# Patient Record
Sex: Female | Born: 1955 | Race: White | Hispanic: No | Marital: Married | Smoking: Never smoker
Health system: Southern US, Community
[De-identification: ages and names within clinical notes are randomized; demographics above are authoritative.]

## PROBLEM LIST (undated history)

## (undated) DIAGNOSIS — Z860100 Personal history of colon polyps, unspecified: Secondary | ICD-10-CM

## (undated) DIAGNOSIS — Z8669 Personal history of other diseases of the nervous system and sense organs: Secondary | ICD-10-CM

## (undated) DIAGNOSIS — H9193 Unspecified hearing loss, bilateral: Secondary | ICD-10-CM

## (undated) DIAGNOSIS — H8101 Meniere's disease, right ear: Secondary | ICD-10-CM

## (undated) DIAGNOSIS — I10 Essential (primary) hypertension: Secondary | ICD-10-CM

## (undated) DIAGNOSIS — Z9181 History of falling: Secondary | ICD-10-CM

## (undated) DIAGNOSIS — R112 Nausea with vomiting, unspecified: Secondary | ICD-10-CM

## (undated) DIAGNOSIS — M5030 Other cervical disc degeneration, unspecified cervical region: Secondary | ICD-10-CM

## (undated) DIAGNOSIS — Z96649 Presence of unspecified artificial hip joint: Secondary | ICD-10-CM

## (undated) DIAGNOSIS — M2040 Other hammer toe(s) (acquired), unspecified foot: Secondary | ICD-10-CM

## (undated) DIAGNOSIS — M7062 Trochanteric bursitis, left hip: Secondary | ICD-10-CM

## (undated) DIAGNOSIS — M1612 Unilateral primary osteoarthritis, left hip: Secondary | ICD-10-CM

## (undated) DIAGNOSIS — M542 Cervicalgia: Secondary | ICD-10-CM

## (undated) DIAGNOSIS — G43709 Chronic migraine without aura, not intractable, without status migrainosus: Secondary | ICD-10-CM

## (undated) DIAGNOSIS — M545 Low back pain: Secondary | ICD-10-CM

## (undated) DIAGNOSIS — E039 Hypothyroidism, unspecified: Secondary | ICD-10-CM

## (undated) DIAGNOSIS — Z0389 Encounter for observation for other suspected diseases and conditions ruled out: Secondary | ICD-10-CM

## (undated) DIAGNOSIS — F0781 Postconcussional syndrome: Secondary | ICD-10-CM

## (undated) DIAGNOSIS — Z8489 Family history of other specified conditions: Secondary | ICD-10-CM

## (undated) DIAGNOSIS — H8110 Benign paroxysmal vertigo, unspecified ear: Secondary | ICD-10-CM

## (undated) DIAGNOSIS — B029 Zoster without complications: Secondary | ICD-10-CM

## (undated) DIAGNOSIS — M797 Fibromyalgia: Secondary | ICD-10-CM

## (undated) DIAGNOSIS — M51369 Other intervertebral disc degeneration, lumbar region without mention of lumbar back pain or lower extremity pain: Secondary | ICD-10-CM

## (undated) DIAGNOSIS — G4761 Periodic limb movement disorder: Secondary | ICD-10-CM

## (undated) DIAGNOSIS — Z8619 Personal history of other infectious and parasitic diseases: Secondary | ICD-10-CM

## (undated) DIAGNOSIS — F458 Other somatoform disorders: Secondary | ICD-10-CM

## (undated) DIAGNOSIS — M1990 Unspecified osteoarthritis, unspecified site: Secondary | ICD-10-CM

## (undated) DIAGNOSIS — N951 Menopausal and female climacteric states: Secondary | ICD-10-CM

## (undated) DIAGNOSIS — Z889 Allergy status to unspecified drugs, medicaments and biological substances status: Secondary | ICD-10-CM

## (undated) DIAGNOSIS — IMO0001 Reserved for inherently not codable concepts without codable children: Secondary | ICD-10-CM

## (undated) DIAGNOSIS — M47816 Spondylosis without myelopathy or radiculopathy, lumbar region: Secondary | ICD-10-CM

## (undated) DIAGNOSIS — E785 Hyperlipidemia, unspecified: Secondary | ICD-10-CM

## (undated) DIAGNOSIS — T84038A Mechanical loosening of other internal prosthetic joint, initial encounter: Secondary | ICD-10-CM

## (undated) DIAGNOSIS — J309 Allergic rhinitis, unspecified: Secondary | ICD-10-CM

## (undated) DIAGNOSIS — N3010 Interstitial cystitis (chronic) without hematuria: Secondary | ICD-10-CM

## (undated) DIAGNOSIS — E063 Autoimmune thyroiditis: Secondary | ICD-10-CM

## (undated) DIAGNOSIS — H1010 Acute atopic conjunctivitis, unspecified eye: Secondary | ICD-10-CM

## (undated) DIAGNOSIS — M5126 Other intervertebral disc displacement, lumbar region: Secondary | ICD-10-CM

## (undated) DIAGNOSIS — K219 Gastro-esophageal reflux disease without esophagitis: Secondary | ICD-10-CM

## (undated) DIAGNOSIS — K6289 Other specified diseases of anus and rectum: Secondary | ICD-10-CM

## (undated) DIAGNOSIS — M5459 Other low back pain: Secondary | ICD-10-CM

## (undated) DIAGNOSIS — Z8719 Personal history of other diseases of the digestive system: Secondary | ICD-10-CM

## (undated) DIAGNOSIS — F39 Unspecified mood [affective] disorder: Secondary | ICD-10-CM

## (undated) DIAGNOSIS — G43711 Chronic migraine without aura, intractable, with status migrainosus: Secondary | ICD-10-CM

## (undated) DIAGNOSIS — E663 Overweight: Secondary | ICD-10-CM

## (undated) DIAGNOSIS — F32A Depression, unspecified: Secondary | ICD-10-CM

## (undated) DIAGNOSIS — R2689 Other abnormalities of gait and mobility: Secondary | ICD-10-CM

## (undated) DIAGNOSIS — E041 Nontoxic single thyroid nodule: Secondary | ICD-10-CM

## (undated) DIAGNOSIS — I499 Cardiac arrhythmia, unspecified: Secondary | ICD-10-CM

## (undated) DIAGNOSIS — S6982XA Other specified injuries of left wrist, hand and finger(s), initial encounter: Secondary | ICD-10-CM

## (undated) DIAGNOSIS — Z9889 Other specified postprocedural states: Secondary | ICD-10-CM

## (undated) DIAGNOSIS — M5136 Other intervertebral disc degeneration, lumbar region: Secondary | ICD-10-CM

## (undated) DIAGNOSIS — Z8249 Family history of ischemic heart disease and other diseases of the circulatory system: Secondary | ICD-10-CM

## (undated) DIAGNOSIS — M25512 Pain in left shoulder: Secondary | ICD-10-CM

## (undated) DIAGNOSIS — F329 Major depressive disorder, single episode, unspecified: Secondary | ICD-10-CM

## (undated) DIAGNOSIS — H43811 Vitreous degeneration, right eye: Secondary | ICD-10-CM

## (undated) DIAGNOSIS — R002 Palpitations: Secondary | ICD-10-CM

## (undated) HISTORY — DX: Personal history of other infectious and parasitic diseases: Z86.19

## (undated) HISTORY — DX: Family history of ischemic heart disease and other diseases of the circulatory system: Z82.49

## (undated) HISTORY — DX: Chronic migraine without aura, intractable, with status migrainosus: G43.711

## (undated) HISTORY — DX: Zoster without complications: B02.9

## (undated) HISTORY — DX: Fibromyalgia: M79.7

## (undated) HISTORY — DX: Presence of unspecified artificial hip joint: Z96.649

## (undated) HISTORY — DX: Other abnormalities of gait and mobility: R26.89

## (undated) HISTORY — DX: Postconcussional syndrome: F07.81

## (undated) HISTORY — DX: Other intervertebral disc displacement, lumbar region: M51.26

## (undated) HISTORY — DX: Allergic rhinitis, unspecified: J30.9

## (undated) HISTORY — DX: Hyperlipidemia, unspecified: E78.5

## (undated) HISTORY — DX: Pain in left shoulder: M25.512

## (undated) HISTORY — DX: Mechanical loosening of other internal prosthetic joint, initial encounter: T84.038A

## (undated) HISTORY — DX: Personal history of colon polyps, unspecified: Z86.0100

## (undated) HISTORY — DX: Unilateral primary osteoarthritis, left hip: M16.12

## (undated) HISTORY — DX: Encounter for observation for other suspected diseases and conditions ruled out: Z03.89

## (undated) HISTORY — DX: Other somatoform disorders: F45.8

## (undated) HISTORY — DX: Other low back pain: M54.59

## (undated) HISTORY — PX: HIP ARTHROPLASTY: SHX981

## (undated) HISTORY — DX: Benign paroxysmal vertigo, unspecified ear: H81.10

## (undated) HISTORY — DX: Cervicalgia: M54.2

## (undated) HISTORY — DX: Chronic migraine without aura, not intractable, without status migrainosus: G43.709

## (undated) HISTORY — DX: Other specified diseases of anus and rectum: K62.89

## (undated) HISTORY — DX: Allergy status to unspecified drugs, medicaments and biological substances: Z88.9

## (undated) HISTORY — PX: OTHER SURGICAL HISTORY: SHX169

## (undated) HISTORY — DX: Other hammer toe(s) (acquired), unspecified foot: M20.40

## (undated) HISTORY — DX: Low back pain: M54.5

## (undated) HISTORY — PX: CYSTOSCOPY W/ DILATION OF BLADDER: SUR374

## (undated) HISTORY — DX: Spondylosis without myelopathy or radiculopathy, lumbar region: M47.816

## (undated) HISTORY — DX: Personal history of other diseases of the nervous system and sense organs: Z86.69

## (undated) HISTORY — DX: Personal history of other diseases of the digestive system: Z87.19

## (undated) HISTORY — DX: Gastro-esophageal reflux disease without esophagitis: K21.9

## (undated) HISTORY — DX: Nontoxic single thyroid nodule: E04.1

## (undated) HISTORY — DX: Meniere's disease, right ear: H81.01

## (undated) HISTORY — DX: Palpitations: R00.2

## (undated) HISTORY — DX: Overweight: E66.3

## (undated) HISTORY — DX: Unspecified mood (affective) disorder: F39

## (undated) HISTORY — DX: Essential (primary) hypertension: I10

## (undated) HISTORY — DX: Trochanteric bursitis, left hip: M70.62

## (undated) HISTORY — DX: Personal history of colonic polyps: Z86.010

## (undated) HISTORY — DX: Autoimmune thyroiditis: E06.3

## (undated) HISTORY — DX: Hypothyroidism, unspecified: E03.9

## (undated) HISTORY — DX: Periodic limb movement disorder: G47.61

## (undated) HISTORY — DX: Vitreous degeneration, right eye: H43.811

## (undated) HISTORY — DX: Other cervical disc degeneration, unspecified cervical region: M50.30

## (undated) HISTORY — PX: COLONOSCOPY: SHX174

## (undated) HISTORY — DX: Menopausal and female climacteric states: N95.1

## (undated) HISTORY — DX: Unspecified hearing loss, bilateral: H91.93

## (undated) HISTORY — DX: History of falling: Z91.81

## (undated) HISTORY — DX: Acute atopic conjunctivitis, unspecified eye: H10.10

---
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
## 1996-09-11 DIAGNOSIS — E785 Hyperlipidemia, unspecified: Secondary | ICD-10-CM

## 1996-09-11 HISTORY — DX: Hyperlipidemia, unspecified: E78.5

## 1998-03-11 ENCOUNTER — Ambulatory Visit (HOSPITAL_COMMUNITY): Admission: RE | Admit: 1998-03-11 | Discharge: 1998-03-11 | Payer: Self-pay | Admitting: Endocrinology

## 1998-09-11 HISTORY — PX: CARDIOVASCULAR STRESS TEST: SHX262

## 1998-10-27 ENCOUNTER — Ambulatory Visit (HOSPITAL_COMMUNITY): Admission: RE | Admit: 1998-10-27 | Discharge: 1998-10-27 | Payer: Self-pay | Admitting: Obstetrics and Gynecology

## 1998-10-27 ENCOUNTER — Encounter: Payer: Self-pay | Admitting: Obstetrics and Gynecology

## 1999-01-19 ENCOUNTER — Encounter: Admission: RE | Admit: 1999-01-19 | Discharge: 1999-04-19 | Payer: Self-pay | Admitting: Internal Medicine

## 1999-02-16 ENCOUNTER — Encounter: Admission: RE | Admit: 1999-02-16 | Discharge: 1999-05-17 | Payer: Self-pay | Admitting: Urology

## 1999-02-28 ENCOUNTER — Ambulatory Visit (HOSPITAL_COMMUNITY): Admission: RE | Admit: 1999-02-28 | Discharge: 1999-02-28 | Payer: Self-pay | Admitting: Interventional Cardiology

## 2001-04-24 ENCOUNTER — Encounter: Admission: RE | Admit: 2001-04-24 | Discharge: 2001-04-24 | Payer: Self-pay | Admitting: Obstetrics and Gynecology

## 2001-04-24 ENCOUNTER — Encounter: Payer: Self-pay | Admitting: Obstetrics and Gynecology

## 2001-08-26 ENCOUNTER — Encounter: Payer: Self-pay | Admitting: Obstetrics and Gynecology

## 2001-08-26 ENCOUNTER — Encounter: Admission: RE | Admit: 2001-08-26 | Discharge: 2001-08-26 | Payer: Self-pay | Admitting: Obstetrics and Gynecology

## 2002-09-08 ENCOUNTER — Encounter: Payer: Self-pay | Admitting: Obstetrics and Gynecology

## 2002-09-08 ENCOUNTER — Encounter: Admission: RE | Admit: 2002-09-08 | Discharge: 2002-09-08 | Payer: Self-pay | Admitting: Obstetrics and Gynecology

## 2003-06-23 ENCOUNTER — Encounter: Admission: RE | Admit: 2003-06-23 | Discharge: 2003-06-23 | Payer: Self-pay | Admitting: Internal Medicine

## 2003-06-23 ENCOUNTER — Encounter: Payer: Self-pay | Admitting: Internal Medicine

## 2003-08-04 ENCOUNTER — Ambulatory Visit (HOSPITAL_COMMUNITY): Admission: RE | Admit: 2003-08-04 | Discharge: 2003-08-04 | Payer: Self-pay | Admitting: Internal Medicine

## 2003-09-15 ENCOUNTER — Encounter: Admission: RE | Admit: 2003-09-15 | Discharge: 2003-09-15 | Payer: Self-pay | Admitting: Obstetrics and Gynecology

## 2003-09-24 ENCOUNTER — Other Ambulatory Visit: Admission: RE | Admit: 2003-09-24 | Discharge: 2003-09-24 | Payer: Self-pay | Admitting: Obstetrics and Gynecology

## 2004-09-26 ENCOUNTER — Other Ambulatory Visit: Admission: RE | Admit: 2004-09-26 | Discharge: 2004-09-26 | Payer: Self-pay | Admitting: Obstetrics and Gynecology

## 2004-10-13 ENCOUNTER — Encounter: Admission: RE | Admit: 2004-10-13 | Discharge: 2004-10-13 | Payer: Self-pay | Admitting: Obstetrics and Gynecology

## 2005-09-02 ENCOUNTER — Encounter: Admission: RE | Admit: 2005-09-02 | Discharge: 2005-09-02 | Payer: Self-pay | Admitting: Rheumatology

## 2005-11-20 ENCOUNTER — Encounter: Admission: RE | Admit: 2005-11-20 | Discharge: 2005-11-20 | Payer: Self-pay | Admitting: Obstetrics and Gynecology

## 2006-01-10 ENCOUNTER — Encounter: Payer: Self-pay | Admitting: Obstetrics and Gynecology

## 2006-08-09 ENCOUNTER — Other Ambulatory Visit: Admission: RE | Admit: 2006-08-09 | Discharge: 2006-08-09 | Payer: Self-pay | Admitting: Obstetrics and Gynecology

## 2007-03-19 ENCOUNTER — Encounter: Admission: RE | Admit: 2007-03-19 | Discharge: 2007-03-19 | Payer: Self-pay | Admitting: Obstetrics and Gynecology

## 2008-03-20 ENCOUNTER — Encounter: Admission: RE | Admit: 2008-03-20 | Discharge: 2008-03-20 | Payer: Self-pay | Admitting: Obstetrics and Gynecology

## 2009-02-16 ENCOUNTER — Encounter: Admission: RE | Admit: 2009-02-16 | Discharge: 2009-02-16 | Payer: Self-pay | Admitting: Physician Assistant

## 2009-03-22 ENCOUNTER — Encounter: Admission: RE | Admit: 2009-03-22 | Discharge: 2009-03-22 | Payer: Self-pay | Admitting: Obstetrics and Gynecology

## 2009-08-11 DIAGNOSIS — E041 Nontoxic single thyroid nodule: Secondary | ICD-10-CM

## 2009-08-11 HISTORY — DX: Nontoxic single thyroid nodule: E04.1

## 2009-08-30 ENCOUNTER — Encounter: Admission: RE | Admit: 2009-08-30 | Discharge: 2009-08-30 | Payer: Self-pay | Admitting: Family Medicine

## 2009-09-11 HISTORY — PX: CARPOMETACARPAL JOINT ARTHROTOMY: SUR102

## 2009-09-11 HISTORY — PX: BREAST BIOPSY: SHX20

## 2010-04-06 ENCOUNTER — Encounter: Admission: RE | Admit: 2010-04-06 | Discharge: 2010-04-06 | Payer: Self-pay | Admitting: Obstetrics and Gynecology

## 2010-04-08 ENCOUNTER — Encounter: Admission: RE | Admit: 2010-04-08 | Discharge: 2010-04-08 | Payer: Self-pay | Admitting: Obstetrics and Gynecology

## 2010-09-16 ENCOUNTER — Encounter
Admission: RE | Admit: 2010-09-16 | Discharge: 2010-09-16 | Payer: Self-pay | Source: Home / Self Care | Attending: Obstetrics and Gynecology | Admitting: Obstetrics and Gynecology

## 2011-02-17 ENCOUNTER — Other Ambulatory Visit: Payer: Self-pay | Admitting: Obstetrics and Gynecology

## 2011-02-17 DIAGNOSIS — Z1231 Encounter for screening mammogram for malignant neoplasm of breast: Secondary | ICD-10-CM

## 2011-03-17 ENCOUNTER — Other Ambulatory Visit: Payer: Self-pay | Admitting: Obstetrics and Gynecology

## 2011-03-17 ENCOUNTER — Ambulatory Visit
Admission: RE | Admit: 2011-03-17 | Discharge: 2011-03-17 | Disposition: A | Discharge status: Home / Self Care | Payer: No Typology Code available for payment source | Source: Ambulatory Visit | Attending: Obstetrics and Gynecology | Admitting: Obstetrics and Gynecology

## 2011-03-17 DIAGNOSIS — Z1231 Encounter for screening mammogram for malignant neoplasm of breast: Secondary | ICD-10-CM

## 2011-04-10 ENCOUNTER — Ambulatory Visit
Admission: RE | Admit: 2011-04-10 | Discharge: 2011-04-10 | Disposition: A | Discharge status: Home / Self Care | Payer: No Typology Code available for payment source | Source: Ambulatory Visit | Attending: Obstetrics and Gynecology | Admitting: Obstetrics and Gynecology

## 2011-04-10 DIAGNOSIS — Z1231 Encounter for screening mammogram for malignant neoplasm of breast: Secondary | ICD-10-CM

## 2011-10-30 ENCOUNTER — Other Ambulatory Visit: Payer: Self-pay | Admitting: Family Medicine

## 2011-10-30 ENCOUNTER — Ambulatory Visit: Payer: No Typology Code available for payment source | Admitting: Family Medicine

## 2011-10-30 ENCOUNTER — Encounter: Payer: Self-pay | Admitting: Family Medicine

## 2011-10-30 DIAGNOSIS — M47816 Spondylosis without myelopathy or radiculopathy, lumbar region: Secondary | ICD-10-CM

## 2011-10-30 HISTORY — DX: Spondylosis without myelopathy or radiculopathy, lumbar region: M47.816

## 2011-11-09 DIAGNOSIS — N3010 Interstitial cystitis (chronic) without hematuria: Secondary | ICD-10-CM | POA: Insufficient documentation

## 2011-11-09 DIAGNOSIS — E039 Hypothyroidism, unspecified: Secondary | ICD-10-CM | POA: Insufficient documentation

## 2011-11-09 HISTORY — DX: Interstitial cystitis (chronic) without hematuria: N30.10

## 2012-04-22 ENCOUNTER — Other Ambulatory Visit: Payer: Self-pay | Admitting: Obstetrics and Gynecology

## 2012-04-22 DIAGNOSIS — Z1231 Encounter for screening mammogram for malignant neoplasm of breast: Secondary | ICD-10-CM

## 2012-05-03 ENCOUNTER — Encounter: Payer: Self-pay | Admitting: Gastroenterology

## 2012-05-08 ENCOUNTER — Ambulatory Visit
Admission: RE | Admit: 2012-05-08 | Discharge: 2012-05-08 | Disposition: A | Discharge status: Home / Self Care | Payer: No Typology Code available for payment source | Source: Ambulatory Visit | Attending: Obstetrics and Gynecology | Admitting: Obstetrics and Gynecology

## 2012-05-08 DIAGNOSIS — Z1231 Encounter for screening mammogram for malignant neoplasm of breast: Secondary | ICD-10-CM

## 2012-06-03 ENCOUNTER — Ambulatory Visit: Payer: No Typology Code available for payment source | Admitting: Gastroenterology

## 2012-06-26 ENCOUNTER — Other Ambulatory Visit: Payer: Self-pay | Admitting: Gastroenterology

## 2012-09-11 DIAGNOSIS — H43811 Vitreous degeneration, right eye: Secondary | ICD-10-CM

## 2012-09-11 HISTORY — DX: Vitreous degeneration, right eye: H43.811

## 2012-10-28 DIAGNOSIS — I10 Essential (primary) hypertension: Secondary | ICD-10-CM | POA: Insufficient documentation

## 2012-10-28 HISTORY — DX: Essential (primary) hypertension: I10

## 2013-03-31 ENCOUNTER — Other Ambulatory Visit: Payer: Self-pay

## 2013-03-31 DIAGNOSIS — Z1231 Encounter for screening mammogram for malignant neoplasm of breast: Secondary | ICD-10-CM

## 2013-05-09 ENCOUNTER — Ambulatory Visit
Admission: RE | Admit: 2013-05-09 | Discharge: 2013-05-09 | Disposition: A | Discharge status: Home / Self Care | Payer: No Typology Code available for payment source | Source: Ambulatory Visit

## 2013-05-09 DIAGNOSIS — Z1231 Encounter for screening mammogram for malignant neoplasm of breast: Secondary | ICD-10-CM

## 2013-05-15 ENCOUNTER — Encounter: Payer: Self-pay | Admitting: Cardiovascular Disease

## 2013-05-15 ENCOUNTER — Ambulatory Visit (INDEPENDENT_AMBULATORY_CARE_PROVIDER_SITE_OTHER): Payer: No Typology Code available for payment source | Admitting: Cardiovascular Disease

## 2013-05-15 VITALS — BP 106/70 | HR 99 | Ht 66.5 in | Wt 204.1 lb

## 2013-05-15 DIAGNOSIS — E7800 Pure hypercholesterolemia, unspecified: Secondary | ICD-10-CM | POA: Insufficient documentation

## 2013-05-15 DIAGNOSIS — E039 Hypothyroidism, unspecified: Secondary | ICD-10-CM

## 2013-05-15 DIAGNOSIS — R002 Palpitations: Secondary | ICD-10-CM

## 2013-05-15 DIAGNOSIS — G43709 Chronic migraine without aura, not intractable, without status migrainosus: Secondary | ICD-10-CM | POA: Insufficient documentation

## 2013-05-15 DIAGNOSIS — N3010 Interstitial cystitis (chronic) without hematuria: Secondary | ICD-10-CM

## 2013-05-15 DIAGNOSIS — I119 Hypertensive heart disease without heart failure: Secondary | ICD-10-CM

## 2013-05-15 DIAGNOSIS — E782 Mixed hyperlipidemia: Secondary | ICD-10-CM

## 2013-05-15 DIAGNOSIS — I10 Essential (primary) hypertension: Secondary | ICD-10-CM

## 2013-05-15 DIAGNOSIS — Z8249 Family history of ischemic heart disease and other diseases of the circulatory system: Secondary | ICD-10-CM

## 2013-05-15 DIAGNOSIS — Z8669 Personal history of other diseases of the nervous system and sense organs: Secondary | ICD-10-CM

## 2013-05-15 DIAGNOSIS — E785 Hyperlipidemia, unspecified: Secondary | ICD-10-CM

## 2013-05-15 HISTORY — DX: Palpitations: R00.2

## 2013-05-15 HISTORY — DX: Family history of ischemic heart disease and other diseases of the circulatory system: Z82.49

## 2013-05-15 HISTORY — DX: Chronic migraine without aura, not intractable, without status migrainosus: G43.709

## 2013-05-15 HISTORY — DX: Essential (primary) hypertension: I10

## 2013-05-15 HISTORY — DX: Pure hypercholesterolemia, unspecified: E78.00

## 2013-05-15 NOTE — Progress Notes (Signed)
Patient ID: FERRELL FLAM, female   DOB: Jan 24, 1956, 57 y.o.   MRN: [id number]     PATIENT PROFILE:  [name] is a 57 year old female who is referred through the courtesy of Dr. Herb Grays for cardiology evaluation in this patient who has cardiac risk factors and very strong family history for premature coronary artery disease.   HPI: Molly Giles admits to at least a 10 year history of hypertension, a six-year history of hyperlipidemia as well as a 4 year history of hypothyroidism. Molly Giles also has a history of interstitial cystitis. Molly Giles tells me in approximately 2000 Molly Giles had a stress test and apparently due to mild "shadow "abnormality which I suspect was artifact Molly Giles ultimately underwent cardiac catheterization by Dr. Katrinka Blazing and was told of having normal coronary arteries. Recently, Molly Giles has noticed some rare palpitations. Her brother died on 2024-07-18at age 9 having undergone initial CBG revascularization surgery at age 53 and recently was diagnosed with significant diabetes. Her father died at age 9 with a myocardial infarction. Molly Giles has noticed some vague back discomfort. Molly Giles denies any episodes of chest tightness or pressure. Molly Giles denies shortness of breath. Molly Giles is now referred for cardiology evaluation following the death of her brother.  Past Medical History  Diagnosis Date  . Mood disorder   . Fibromyalgia   . Hypertension   . Hyperlipidemia   . Migraine   . Hypothyroidism     Past Surgical History  Procedure Laterality Date  . Carpometacarpal joint arthrotomy  2011  . Cardiovascular stress test  2000    Unremarkable per pt report  . Breast biopsy  2011    Benign histology    Allergies  Allergen Reactions  . Codeine Other (See Comments)    Unknown   . Latex     Current Outpatient Prescriptions  Medication Sig Dispense Refill  . amLODipine (NORVASC) 5 MG tablet Take 2 tablets (10 mg total) by mouth daily.      . Chlorpheniramine-Phenylephrine (WAL-PHED PE SINUS/ALLERGY PO)  Take 1 tablet by mouth 2 (two) times daily.      . citalopram (CELEXA) 20 MG tablet Take 1 tablet (20 mg total) by mouth daily.      Marland Kitchen conjugated estrogens (PREMARIN) vaginal cream Place vaginally 2 (two) times a week.      . Difluprednate (DUREZOL OP) Apply 1 drop to eye 4 (four) times daily.      Marland Kitchen eletriptan (RELPAX) 40 MG tablet One tablet by mouth as needed for migraine headache.  If the headache improves and then returns, dose may be repeated after 2 hours have elapsed since first dose (do not exceed 80 mg per day). may repeat in 2 hours if necessary  10 tablet  0  . ibuprofen (ADVIL,MOTRIN) 200 MG tablet Take 200 mg by mouth as needed for pain.      Marland Kitchen levothyroxine (SYNTHROID, LEVOTHROID) 50 MCG tablet Take 1 tablet (50 mcg total) by mouth daily.  90 tablet  3  . Meth-Hyo-M Bl-Na Phos-Ph Sal (URIBEL) 118 MG CAPS Take 1 capsule by mouth as needed.      . methocarbamol (ROBAXIN) 500 MG tablet Take 500 mg by mouth as needed.      . nystatin-triamcinolone (MYCOLOG II) cream Apply topically as needed.      . pentosan polysulfate (ELMIRON) 100 MG capsule Take 1 capsule (100 mg total) by mouth 3 (three) times daily before meals.      . rosuvastatin (CRESTOR) 10 MG tablet  Take 10 mg by mouth daily.      . traMADol (ULTRAM) 50 MG tablet Take 1 tablet (50 mg total) by mouth every 8 (eight) hours as needed for pain.  15 tablet  0  . zolpidem (AMBIEN) 10 MG tablet Take 1 tablet (10 mg total) by mouth at bedtime as needed for sleep.  15 tablet     No current facility-administered medications for this visit.   Socially Molly Giles is married for 31 years. 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 Molly Giles went to [employer] for college. There is no alcohol or tobacco use. Molly Giles exercises approximately one to 2 times per week.  Family History  Problem Relation Age of Onset  . Alzheimer's disease Mother   . Hyperlipidemia Mother   . Hypertension Mother   . Osteoporosis Mother   . Parkinson's  disease Mother   . Heart disease Father   . Hyperlipidemia Father   . Hypertension Father   . Migraines Sister   . Allergies Sister   . Hypertension Sister   . Hyperlipidemia Brother   . Cardiomyopathy Brother   . Diabetes type II Brother   . Kidney disease Brother   . Hypertension Brother   . Hyperlipidemia Brother   . Kidney disease Brother   . Diabetes type II    . Breast cancer Maternal Aunt     ROS is negative for fever chills or night sweats. Molly Giles denies recent chest pain. Molly Giles denies visual symptoms. Molly Giles denies PND or orthopnea. Molly Giles denies wheezing. Molly Giles does have history of migraine headaches. Molly Giles does note rare palpitations and typically he notes some mild dizziness as a precursor to a migraine attack. Molly Giles tells me Molly Giles recently underwent colonoscopy which was normal. Molly Giles denies claudication. Molly Giles denies myalgias. Molly Giles does have interstitial cystitis . Molly Giles denies edema. Other system review is negative.  PE BP 106/70  Pulse 99  Ht 5' 6.5" (1.689 m)  Wt 204 lb 1.6 oz (92.579 kg)  BMI 32.45 kg/m2 General: Alert, oriented, no distress.  Skin: normal turgor, no rashes HEENT: Normocephalic, atraumatic. Pupils round and reactive; sclera anicteric; Fundi with mild increased vessel tortuosity without any nicking hemorrhages or exudates Nose without nasal septal hypertrophy Mouth/Parynx benign; Mallinpatti scale Neck: No JVD, no carotid briuts Lungs: clear to ausculatation and percussion; no wheezing or rales Heart: RRR, s1 s2 normal faint 1/6 systolic murmur; no S3 gallop the Abdomen: Mild obesity with central adiposity;soft, nontender; no hepatosplenomehaly, BS+; abdominal aorta nontender and not dilated by palpation. Pulses 2+ Extremities: no clubbinbg cyanosis or edema, Homan's sign negative  Neurologic: grossly nonfocal    ECG: Normal sinus rhythm at 99 beats per minute. QTc interval 459 ms. PR interval 138 ms.  LABS:  BMET No results found for this basename: na, k, cl,  co2, glucose, bun, creatinine, calcium, gfrnonaa, gfraa     Hepatic Function Panel  No results found for this basename: prot, albumin, ast, alt, alkphos, bilitot, bilidir, ibili     CBC No results found for this basename: wbc, rbc, hgb, hct, plt, mcv, mch, mchc, rdw, neutrabs, lymphsabs, monoabs, eosabs, basosabs     BNP No results found for this basename: probnp    Lipid Panel  No results found for this basename: chol, trig, hdl, cholhdl, vldl, ldlcalc     RADIOLOGY: Mm Digital Screening  05/09/2013   *RADIOLOGY REPORT*  Clinical Data: Screening.  DIGITAL SCREENING BILATERAL MAMMOGRAM WITH CAD DIGITAL BREAST TOMOSYNTHESIS  Digital  breast tomosynthesis images are acquired in two projections.  These images are reviewed in combination with the digital mammogram, confirming the findings below.  Comparison:  11/20/2005  FINDINGS:  ACR Breast Density Category b: There are scattered areas of fibroglandular density.  There is no suspicious dominant mass, architectural distortion, or calcification to suggest malignancy.  Images were processed with CAD.  IMPRESSION: No mammographic evidence of malignancy.  A result letter of this screening mammogram will be mailed directly to the patient.  RECOMMENDATION: Screening mammogram in one year. (Code:SM-B-01Y)  BI-RADS CATEGORY 1:  Negative.   Original Report Authenticated By: Cain Saupe, M.D.     ASSESSMENT AND PLAN: From a cardiac standpoint, Molly Giles has risk factors notable for hypertension as well as hyperlipidemia. Molly Giles does have very strong family history for cardiac disease in all 4 grandparents as well as having a father who died at age 104 and a brother who had bypass surgery in his 89s and recently passed away at age 34. Dr. Carney Living recently checked laboratory on 04/17/2013 I reviewed these. Her most recent cholesterol was 153,  triglycerides 115,  HDL 48, VLDL 23, LDL 82. Molly Giles did have SGPT of 37, mildly increased. This Traore denies any  history of recent chest pain. 14 years ago Molly Giles underwent cardiac catheterization which revealed normal coronary arteries. I have recommended that in 3 months Molly Giles undergo an NMR lipoprotein for more aggressive evaluation of her lipid status. At that time am also scheduling her for a 2-D echo Doppler study to further evaluate potential hypotensive effects on her cardiac function as well as nodular architecture. I will see her back in the office in followup of the above studies. We did discuss the importance of weight loss and exercising approximately 4-5 days per week.   Lennette Bihari, MD, [facility] 05/15/2013 6:24 PM

## 2013-05-15 NOTE — Patient Instructions (Addendum)
Your physician has requested that you have an echocardiogram. Echocardiography is a painless test that uses sound waves to create images of your heart. It provides your doctor with information about the size and shape of your heart and how well your heart's chambers and valves are working. This procedure takes approximately one hour. There are no restrictions for this procedure.  Your physician recommends that you return for lab work in: 2-3 MONTHS.  Your physician recommends that you schedule a follow-up appointment in: 2-3 MONTHS.

## 2013-06-16 ENCOUNTER — Ambulatory Visit (HOSPITAL_COMMUNITY)
Admission: RE | Admit: 2013-06-16 | Discharge: 2013-06-16 | Disposition: A | Discharge status: Home / Self Care | Payer: No Typology Code available for payment source | Source: Ambulatory Visit | Attending: Cardiovascular Disease | Admitting: Cardiovascular Disease

## 2013-06-16 DIAGNOSIS — I119 Hypertensive heart disease without heart failure: Secondary | ICD-10-CM

## 2013-06-16 NOTE — Progress Notes (Signed)
2D Echo Performed 06/16/2013    Kemonte Ullman, RCS  

## 2013-06-23 NOTE — Progress Notes (Signed)
Quick Note:  Informed patient of her echo results. ______

## 2013-06-30 ENCOUNTER — Encounter: Payer: Self-pay | Admitting: Cardiovascular Disease

## 2013-06-30 ENCOUNTER — Ambulatory Visit (INDEPENDENT_AMBULATORY_CARE_PROVIDER_SITE_OTHER): Payer: No Typology Code available for payment source | Admitting: Cardiovascular Disease

## 2013-06-30 VITALS — BP 110/80 | HR 89 | Ht 66.5 in | Wt 204.6 lb

## 2013-06-30 DIAGNOSIS — Z8249 Family history of ischemic heart disease and other diseases of the circulatory system: Secondary | ICD-10-CM

## 2013-06-30 DIAGNOSIS — E039 Hypothyroidism, unspecified: Secondary | ICD-10-CM

## 2013-06-30 DIAGNOSIS — E785 Hyperlipidemia, unspecified: Secondary | ICD-10-CM

## 2013-06-30 DIAGNOSIS — R002 Palpitations: Secondary | ICD-10-CM

## 2013-06-30 DIAGNOSIS — I10 Essential (primary) hypertension: Secondary | ICD-10-CM

## 2013-06-30 NOTE — Patient Instructions (Signed)
No changes WITH CURRENT MEDICATION.  Your physician wants you to follow-up in 12 months DR KELLY.  You will receive a reminder letter in the mail two months in advance. If you don't receive a letter, please call our office to schedule the follow-up appointment.

## 2013-06-30 NOTE — Progress Notes (Signed)
Patient ID: Molly Giles, female   DOB: 1955/11/11, 57 y.o.   MRN: [id number]      [name] is a 57 year old female who I saw 6 weeks ago through the courtesy of Dr. Herb Grays for cardiology evaluation in this patient who has cardiac risk factors and a very strong family history for premature coronary artery disease.  Molly Giles has a 10 year history of hypertension, a six-year history of hyperlipidemia as well as a 4 year history of hypothyroidism. She also has a history of interstitial cystitis. In 2000, she had a stress test and apparently due to mild "shadow "abnormality which I suspect was artifact she ultimately underwent cardiac catheterization by Dr. Katrinka Blazing and was told of having normal coronary arteries. Recently, she has noticed some rare palpitations. Her brother died on 2024-07-21at age 57 having undergone initial CABG revascularization surgery at age 81 and recently was diagnosed with significant diabetes. Her father died at age 60 with a myocardial infarction. She has noticed some vague back discomfort. She denies any episodes of chest tightness or pressure. She denies shortness of breath. She is was referred for cardiology evaluation following the death of her brother. Recent laboratory which had been done by Dr. year had shown a total cholesterol 153, triglycerides 1:15, HDL 48, VLDL 23, and LDL 82. I did recommend that she undergo a 2-D echo Doppler study the this was done on 06/16/2013 and confirm normal systolic function with an ejection fraction of 55-60%. She did have evidence for mild left ventricular hypertrophy and it was borderline grade 1 diastolic dysfunction. She had normal tissue Doppler assessment suggests normal LV filling pressure. There was evidence for very mild MR and TR. RV systolic pressure was normal. She denies recent chest pain. She denies shortness of breath. She denies any further palpitations. She is on levothyroxine for thyroid replacement. She tells me several weeks  ago she did sustain a posterior vitreous detachment and didn't see ophthalmologist. She has tolerated low-dose Crestor 10 mg for hyperlipidemia and has been on amlodipine 5 mg for her blood pressure.  Past Medical History  Diagnosis Date  . Mood disorder   . Fibromyalgia   . Hypertension   . Hyperlipidemia   . Migraine   . Hypothyroidism     Past Surgical History  Procedure Laterality Date  . Carpometacarpal joint arthrotomy  2011  . Cardiovascular stress test  2000    Unremarkable per pt report  . Breast biopsy  2011    Benign histology    Allergies  Allergen Reactions  . Codeine Other (See Comments)    Unknown   . Latex     Current Outpatient Prescriptions  Medication Sig Dispense Refill  . amLODipine (NORVASC) 5 MG tablet Take 2 tablets (10 mg total) by mouth daily.      . Chlorpheniramine-Phenylephrine (WAL-PHED PE SINUS/ALLERGY PO) Take 1 tablet by mouth 2 (two) times daily.      . citalopram (CELEXA) 20 MG tablet Take 1 tablet (20 mg total) by mouth daily.      Marland Kitchen conjugated estrogens (PREMARIN) vaginal cream Place vaginally 2 (two) times a week.      . Difluprednate (DUREZOL OP) Apply 1 drop to eye 4 (four) times daily.      Marland Kitchen eletriptan (RELPAX) 40 MG tablet One tablet by mouth as needed for migraine headache.  If the headache improves and then returns, dose may be repeated after 2 hours have elapsed since first dose (do  not exceed 80 mg per day). may repeat in 2 hours if necessary  10 tablet  0  . ibuprofen (ADVIL,MOTRIN) 200 MG tablet Take 200 mg by mouth as needed for pain.      Marland Kitchen levothyroxine (SYNTHROID, LEVOTHROID) 50 MCG tablet Take 1 tablet (50 mcg total) by mouth daily.  90 tablet  3  . Meth-Hyo-M Bl-Na Phos-Ph Sal (URIBEL) 118 MG CAPS Take 1 capsule by mouth as needed.      . methocarbamol (ROBAXIN) 500 MG tablet Take 500 mg by mouth as needed.      . nystatin-triamcinolone (MYCOLOG II) cream Apply topically as needed.      . pentosan polysulfate (ELMIRON)  100 MG capsule Take 1 capsule (100 mg total) by mouth 3 (three) times daily before meals.      . rosuvastatin (CRESTOR) 10 MG tablet Take 10 mg by mouth daily.      . traMADol (ULTRAM) 50 MG tablet Take 1 tablet (50 mg total) by mouth every 8 (eight) hours as needed for pain.  15 tablet  0  . zolpidem (AMBIEN) 10 MG tablet Take 1 tablet (10 mg total) by mouth at bedtime as needed for sleep.  15 tablet     No current facility-administered medications for this visit.   Socially she is married for 31 years. 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 She went to [employer] for college. There is no alcohol or tobacco use. She exercises approximately one to 2 times per week.  Family History  Problem Relation Age of Onset  . Alzheimer's disease Mother   . Hyperlipidemia Mother   . Hypertension Mother   . Osteoporosis Mother   . Parkinson's disease Mother   . Heart disease Father   . Hyperlipidemia Father   . Hypertension Father   . Migraines Sister   . Allergies Sister   . Hypertension Sister   . Hyperlipidemia Brother   . Cardiomyopathy Brother   . Diabetes type II Brother   . Kidney disease Brother   . Hypertension Brother   . Hyperlipidemia Brother   . Kidney disease Brother   . Diabetes type II    . Breast cancer Maternal Aunt     ROS is negative for fever chills or night sweats. She did have a recent posterior vitreous detachment of her right eye. She denies recent chest pain. She denies PND or orthopnea. Denies cough or chest congestion. She denies wheezing. She does have history of migraine headaches. She does note rare palpitations and typically he notes some mild dizziness as a precursor to a migraine attack. She denies change in bowel or bladder habits. She denies hematuria or hematochezia. She tells me she recently underwent colonoscopy which was normal. She denies claudication. She denies myalgias. She does have interstitial cystitis as well as  fibromyalgia. She denies edema. She denies skin rash she denies tremors. Other comprehensive 12 point system review is negative.  PE BP 110/80  Pulse 89  Ht 5' 6.5" (1.689 m)  Wt 204 lb 9.6 oz (92.806 kg)  BMI 32.53 kg/m2 General: Alert, oriented, no distress.  Skin: normal turgor, no rashes HEENT: Normocephalic, atraumatic. Pupils round and reactive; sclera anicteric; Fundi with mild increased vessel tortuosity without any nicking hemorrhages or exudates Nose without nasal septal hypertrophy Mouth/Parynx benign; Mallinpatti scale 2 Neck: No JVD, no carotid briuts Lungs: clear to ausculatation and percussion; no wheezing or rales Heart: RRR, s1 s2 normal faint 1/6  systolic murmur; no S3 gallop  Abdomen: Mild obesity with central adiposity;soft, nontender; no hepatosplenomehaly, BS+; abdominal aorta nontender and not dilated by palpation. Pulses 2+ Extremities: no clubbinbg cyanosis or edema, Homan's sign negative  Neurologic: grossly nonfocal    ECG: Normal sinus rhythm at 89 beats per minute. QTc interval 452 ms. PR interval 148 ms.  LABS:  BMET No results found for this basename: na,  k,  cl,  co2,  glucose,  bun,  creatinine,  calcium,  gfrnonaa,  gfraa     Hepatic Function Panel  No results found for this basename: prot,  albumin,  ast,  alt,  alkphos,  bilitot,  bilidir,  ibili     CBC No results found for this basename: wbc,  rbc,  hgb,  hct,  plt,  mcv,  mch,  mchc,  rdw,  neutrabs,  lymphsabs,  monoabs,  eosabs,  basosabs     BNP No results found for this basename: probnp    Lipid Panel  No results found for this basename: chol,  trig,  hdl,  cholhdl,  vldl,  ldlcalc     RADIOLOGY: Mm Digital Screening  05/09/2013   *RADIOLOGY REPORT*  Clinical Data: Screening.  DIGITAL SCREENING BILATERAL MAMMOGRAM WITH CAD DIGITAL BREAST TOMOSYNTHESIS  Digital breast tomosynthesis images are acquired in two projections.  These images are reviewed in combination with the  digital mammogram, confirming the findings below.  Comparison:  11/20/2005  FINDINGS:  ACR Breast Density Category b: There are scattered areas of fibroglandular density.  There is no suspicious dominant mass, architectural distortion, or calcification to suggest malignancy.  Images were processed with CAD.  IMPRESSION: No mammographic evidence of malignancy.  A result letter of this screening mammogram will be mailed directly to the patient.  RECOMMENDATION: Screening mammogram in one year. (Code:SM-B-01Y)  BI-RADS CATEGORY 1:  Negative.   Original Report Authenticated By: Cain Saupe, M.D.     ASSESSMENT AND PLAN: Ms. Kraker does have a 10 year history of hypertension as well as a history of hyperlipidemia and hypothyroidism. Remotely, coronary angiography revealed normal coronary arteries. She has been without chest pain. Her blood pressure continues to be well controlled on amlodipine 5 mg daily. I reviewed her echo Doppler in detail with her. This shows normal systolic function. His borderline grade 1 diastolic dysfunction. She is very mild MR and TR and has normal estimated pulmonary systolic pressure. She did have recent laboratory as noted above by Dr. Carney Living. I have encouraged additional exercise but she only is doing approximately one to 2 days per week. I've suggested that she exercises approximately 5 days per week. She does have mild obesity with a body mass index of 32.53. Her EKG is unchanged. She's not having any further ectopy. After Carney Living is monitoring her blood work. As long as she remains stable, I will see her in one year for followup evaluation or sooner if problems arise  Lennette Bihari, MD, [facility] 06/30/2013 12:21 PM

## 2013-07-01 ENCOUNTER — Encounter: Payer: Self-pay | Admitting: Cardiovascular Disease

## 2013-07-10 ENCOUNTER — Encounter: Payer: Self-pay | Admitting: *Deleted

## 2013-07-10 ENCOUNTER — Other Ambulatory Visit: Payer: Self-pay | Admitting: *Deleted

## 2013-07-10 DIAGNOSIS — I119 Hypertensive heart disease without heart failure: Secondary | ICD-10-CM

## 2013-07-10 DIAGNOSIS — E782 Mixed hyperlipidemia: Secondary | ICD-10-CM

## 2013-07-15 ENCOUNTER — Ambulatory Visit: Payer: No Typology Code available for payment source | Admitting: Cardiovascular Disease

## 2013-09-21 ENCOUNTER — Telehealth: Payer: Self-pay | Admitting: Physician Assistant

## 2013-09-21 MED ORDER — METOPROLOL TARTRATE 25 MG PO TABS: 12.5000 mg | ORAL_TABLET | ORAL | Status: DC | PRN

## 2013-09-21 NOTE — Telephone Encounter (Signed)
Ms. Raben calls the answering service today c/o palpitations. These occur intermittently, described as a "skipped beat" with associated mild lightheadedness. These are uncomfortable and have become more consistent per patient. She denies chest pain, shortness of breath or syncope. No h/o arrhythmia on chart review. No frank cardiac history. Established care with Dr. Georgina Peer 06/2013 given family h/o CAD and RFs. Thyroid has been well-controlled. She avoids caffeine and EtOH. No OSA. Will provide with Lopressor PRN. Follow-up in the office this week for EKG and further evaluation. She understood and agreed.   Jacquelynn Cree, PA-C 09/21/2013 2:47 PM

## 2013-09-23 ENCOUNTER — Ambulatory Visit (INDEPENDENT_AMBULATORY_CARE_PROVIDER_SITE_OTHER): Payer: No Typology Code available for payment source | Admitting: Cardiovascular Disease

## 2013-09-23 VITALS — BP 92/88 | HR 68 | Ht 66.5 in | Wt 206.3 lb

## 2013-09-23 DIAGNOSIS — Z8249 Family history of ischemic heart disease and other diseases of the circulatory system: Secondary | ICD-10-CM

## 2013-09-23 DIAGNOSIS — I10 Essential (primary) hypertension: Secondary | ICD-10-CM

## 2013-09-23 DIAGNOSIS — E785 Hyperlipidemia, unspecified: Secondary | ICD-10-CM

## 2013-09-23 DIAGNOSIS — E039 Hypothyroidism, unspecified: Secondary | ICD-10-CM

## 2013-09-23 DIAGNOSIS — Z8669 Personal history of other diseases of the nervous system and sense organs: Secondary | ICD-10-CM

## 2013-09-23 DIAGNOSIS — R002 Palpitations: Secondary | ICD-10-CM

## 2013-09-23 MED ORDER — METOPROLOL TARTRATE 25 MG PO TABS: 25.0000 mg | ORAL_TABLET | Freq: Two times a day (BID) | ORAL | Status: DC

## 2013-09-23 NOTE — Progress Notes (Signed)
Patient ID: Molly Giles, female   DOB: Aug 02, 1956, 58 y.o.   MRN: [id number]     HPI: Molly Giles is a 58 year old female who is followed by Dr. Florina Giles for primary care.  Molly Giles has a 10 year history of hypertension, a six-year history of hyperlipidemia as well as a 4 year history of hypothyroidism. She also has a history of interstitial cystitis. In 2000, she had a stress test and due to mild "shadow" abnormality which I suspect was artifact she ultimately underwent cardiac catheterization by Dr. Tamala Giles and was told of having normal coronary arteries. Recently, she has noticed some rare palpitations. Her brother died on 14-Apr-2023 at age 76 having undergone initial CABG revascularization surgery at age 45 and recently was diagnosed with significant diabetes. Her father died at age 15 with a myocardial infarction. She has noticed some vague back discomfort. She denies any episodes of chest tightness or pressure. She denies shortness of breath. She is was referred for cardiology evaluation following the death of her brother. Recent laboratory which had been done by Dr. year had shown a total cholesterol 153, triglycerides 1:15, HDL 48, VLDL 23, and LDL 82. I did recommend that she undergo a 2-D echo Doppler study the this was done on 06/16/2013 and confirm normal systolic function with an ejection fraction of 55-60%. She did have evidence for mild left ventricular hypertrophy and it was borderline grade 1 diastolic dysfunction. She had normal tissue Doppler assessment suggests normal LV filling pressure. There was evidence for very mild MR and TR. RV systolic pressure was normal.  She has experienced occasional palpitations with activity and since last week has noticed almost daily episodes of palpitations. She also has complained of some mild congestion. She is tolerating Crestor for hyperlipidemia. She denies recurrent episodes of chest discomfort. She presents for followup cardiology  evaluation.   Past Medical History  Diagnosis Date  . Mood disorder   . Fibromyalgia   . Hypertension   . Hyperlipidemia   . Migraine   . Hypothyroidism     Past Surgical History  Procedure Laterality Date  . Carpometacarpal joint arthrotomy  2011  . Cardiovascular stress test  2000    Unremarkable per pt report  . Breast biopsy  2011    Benign histology    Allergies  Allergen Reactions  . Codeine Other (See Comments)    Unknown   . Latex     Current Outpatient Prescriptions  Medication Sig Dispense Refill  . amLODipine (NORVASC) 5 MG tablet Take 2 tablets (10 mg total) by mouth daily.      . citalopram (CELEXA) 20 MG tablet Take 1 tablet (20 mg total) by mouth daily.      Marland Kitchen conjugated estrogens (PREMARIN) vaginal cream Place vaginally 2 (two) times a week.      . Difluprednate (DUREZOL OP) Apply 1 drop to eye 4 (four) times daily.      Marland Kitchen eletriptan (RELPAX) 40 MG tablet One tablet by mouth as needed for migraine headache.  If the headache improves and then returns, dose may be repeated after 2 hours have elapsed since first dose (do not exceed 80 mg per day). may repeat in 2 hours if necessary  10 tablet  0  . ibuprofen (ADVIL,MOTRIN) 200 MG tablet Take 200 mg by mouth as needed for pain.      Marland Kitchen levocetirizine (XYZAL) 5 MG tablet Take 5 mg by mouth every evening.      Marland Kitchen  levothyroxine (SYNTHROID, LEVOTHROID) 50 MCG tablet Take 1 tablet (50 mcg total) by mouth daily.  90 tablet  3  . Meth-Hyo-M Bl-Na Phos-Ph Sal (URIBEL) 118 MG CAPS Take 1 capsule by mouth as needed.      . methocarbamol (ROBAXIN) 500 MG tablet Take 500 mg by mouth as needed.      . metoprolol tartrate (LOPRESSOR) 25 MG tablet Take 25 mg by mouth as needed (palpitations).      . montelukast (SINGULAIR) 10 MG tablet Take 10 mg by mouth at bedtime.      Marland Kitchen nystatin-triamcinolone (MYCOLOG II) cream Apply topically as needed.      . Olopatadine HCl (PATANASE NA) Place 1 spray into the nose as needed.      .  pentosan polysulfate (ELMIRON) 100 MG capsule Take 1 capsule (100 mg total) by mouth 3 (three) times daily before meals.      . rosuvastatin (CRESTOR) 10 MG tablet Take 10 mg by mouth daily.      . traMADol (ULTRAM) 50 MG tablet Take 1 tablet (50 mg total) by mouth every 8 (eight) hours as needed for pain.  15 tablet  0  . zolpidem (AMBIEN) 10 MG tablet Take 1 tablet (10 mg total) by mouth at bedtime as needed for sleep.  15 tablet     No current facility-administered medications for this visit.   Socially she is married for 31 years. 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 She went to [employer] for college. There is no alcohol or tobacco use. She exercises approximately one to 2 times per week.  Family History  Problem Relation Age of Onset  . Alzheimer's disease Mother   . Hyperlipidemia Mother   . Hypertension Mother   . Osteoporosis Mother   . Parkinson's disease Mother   . Heart disease Father   . Hyperlipidemia Father   . Hypertension Father   . Migraines Sister   . Allergies Sister   . Hypertension Sister   . Hyperlipidemia Brother   . Cardiomyopathy Brother   . Diabetes type II Brother   . Kidney disease Brother   . Hypertension Brother   . Hyperlipidemia Brother   . Kidney disease Brother   . Diabetes type II    . Breast cancer Maternal Aunt     ROS is negative for fever chills or night sweats. She did have a recent posterior vitreous detachment of her right eye. She denies recent change in vision. There is no hearing abnormality the She denies recent chest pain. She denies PND or orthopnea. Denies cough or chest congestion. She denies wheezing. She does have history of migraine headaches. She has noted more for palpitations and typically he notes some mild dizziness as a precursor to a migraine attack. She denies change in bowel or bladder habits. She denies hematuria or hematochezia. She tells me she recently underwent colonoscopy which was normal.  She denies claudication. She denies myalgias. She does have interstitial cystitis as well as fibromyalgia. She denies edema. She denies skin rash.  She denies tremors. She denies issues with sleep. Other comprehensive 14 point system review is negative.  PE BP 92/88  Pulse 68  Ht 5' 6.5" (1.689 m)  Wt 206 lb 4.8 oz (93.577 kg)  BMI 32.80 kg/m2 General: Alert, oriented, no distress.  Skin: normal turgor, no rashes HEENT: Normocephalic, atraumatic. Pupils round and reactive; sclera anicteric; Fundi with mild increased vessel tortuosity without any nicking hemorrhages or  exudates Nose without nasal septal hypertrophy Mouth/Parynx benign; Mallinpatti scale 2 Neck: No JVD, no carotid bruitts with normal carotid upstroke Lungs: clear to ausculatation and percussion; no wheezing or rales Heart: RRR, s1 s2 normal faint 1/6 systolic murmur; no S3 gallop ; no rub thrill or heave Abdomen: Mild obesity with central adiposity;soft, nontender; no hepatosplenomehaly, BS+; abdominal aorta nontender and not dilated by palpation. Pulses 2+ Extremities: no clubbinbg cyanosis or edema, Homan's sign negative  Neurologic: grossly nonfocal; cranial nerves intact Psychological: Normal affect and mood    ECG (independently read by me): Normal sinus rhythm at 68 beats per minute. No ectopy. Normal intervals.  Prior ECG of 06/30/2013: Normal sinus rhythm at 89 beats per minute. QTc interval 452 ms. PR interval 148 ms.  LABS:  BMET No results found for this basename: na,  k,  cl,  co2,  glucose,  bun,  creatinine,  calcium,  gfrnonaa,  gfraa     Hepatic Function Panel  No results found for this basename: prot,  albumin,  ast,  alt,  alkphos,  bilitot,  bilidir,  ibili     CBC No results found for this basename: wbc,  rbc,  hgb,  hct,  plt,  mcv,  mch,  mchc,  rdw,  neutrabs,  lymphsabs,  monoabs,  eosabs,  basosabs     BNP No results found for this basename: probnp    Lipid Panel  No results  found for this basename: chol,  trig,  hdl,  cholhdl,  vldl,  ldlcalc     RADIOLOGY: Mm Digital Screening  05/09/2013   *RADIOLOGY REPORT*  Clinical Data: Screening.  DIGITAL SCREENING BILATERAL MAMMOGRAM WITH CAD DIGITAL BREAST TOMOSYNTHESIS  Digital breast tomosynthesis images are acquired in two projections.  These images are reviewed in combination with the digital mammogram, confirming the findings below.  Comparison:  11/20/2005  FINDINGS:  ACR Breast Density Category b: There are scattered areas of fibroglandular density.  There is no suspicious dominant mass, architectural distortion, or calcification to suggest malignancy.  Images were processed with CAD.  IMPRESSION: No mammographic evidence of malignancy.  A result letter of this screening mammogram will be mailed directly to the patient.  RECOMMENDATION: Screening mammogram in one year. (Code:SM-B-01Y)  BI-RADS CATEGORY 1:  Negative.   Original Report Authenticated By: Ulyess Blossom, M.D.     ASSESSMENT AND PLAN: [name] is a 58 year old female who has a 10 year history of hypertension as well as a history of hyperlipidemia and hypothyroidism. Remotely, coronary angiography revealed normal coronary arteries. She has been without chest pain. Presently, she complains of almost daily episodes of palpitations within the last week. Her blood pressure today is mildly low initially at 90/70 when taken by the nurse. However, when rechecked by me, her blood pressure was 108/70 supine and she was not orthostatic with a blood pressure 104/70 standing. She has only been taking metoprolol on an as-needed basis. I am recommending that she change this and take this at 25 mg twice a day. I am recommending she decrease her amlodipine from 10 mg to 5 mg so that she may tolerate this from a blood pressure standpoint. She is tolerating her other medications. She is on Synthroid for hypothyroidism, and on Crestor 10 mg for hyperlipidemia. Dr. Charleston Poot has  been checking laboratory. She does have migraine headaches but these seem to be stable presently. I will see her in 3 months for cardiology followup evaluation or sooner if problems arise.  Troy Sine, MD, [facility] 09/23/2013 2:40 PM

## 2013-09-23 NOTE — Patient Instructions (Signed)
Your physician recommends that you schedule a follow-up appointment in: 2 months  Your physician has recommended you make the following change in your medication: Increase Metoprolol to 25 mg twice daily and decrease Amlodipine to 5 mg daily

## 2013-10-01 DIAGNOSIS — M797 Fibromyalgia: Secondary | ICD-10-CM | POA: Insufficient documentation

## 2013-10-01 DIAGNOSIS — E785 Hyperlipidemia, unspecified: Secondary | ICD-10-CM | POA: Insufficient documentation

## 2013-10-01 DIAGNOSIS — F39 Unspecified mood [affective] disorder: Secondary | ICD-10-CM | POA: Insufficient documentation

## 2013-10-01 HISTORY — DX: Fibromyalgia: M79.7

## 2013-10-18 ENCOUNTER — Encounter: Payer: Self-pay | Admitting: Cardiovascular Disease

## 2013-11-19 ENCOUNTER — Ambulatory Visit: Payer: No Typology Code available for payment source | Admitting: Cardiovascular Disease

## 2014-01-01 ENCOUNTER — Ambulatory Visit: Payer: No Typology Code available for payment source | Admitting: Cardiovascular Disease

## 2014-01-14 ENCOUNTER — Telehealth: Payer: Self-pay | Admitting: Cardiovascular Disease

## 2014-01-16 NOTE — Telephone Encounter (Signed)
Closed encounter °

## 2014-01-27 ENCOUNTER — Ambulatory Visit (INDEPENDENT_AMBULATORY_CARE_PROVIDER_SITE_OTHER): Payer: No Typology Code available for payment source | Admitting: Cardiovascular Disease

## 2014-01-27 ENCOUNTER — Encounter: Payer: Self-pay | Admitting: Cardiovascular Disease

## 2014-01-27 VITALS — BP 112/82 | HR 87 | Ht 66.5 in | Wt 216.7 lb

## 2014-01-27 DIAGNOSIS — Z8669 Personal history of other diseases of the nervous system and sense organs: Secondary | ICD-10-CM

## 2014-01-27 DIAGNOSIS — R002 Palpitations: Secondary | ICD-10-CM

## 2014-01-27 DIAGNOSIS — E785 Hyperlipidemia, unspecified: Secondary | ICD-10-CM

## 2014-01-27 DIAGNOSIS — Z8249 Family history of ischemic heart disease and other diseases of the circulatory system: Secondary | ICD-10-CM

## 2014-01-27 DIAGNOSIS — I10 Essential (primary) hypertension: Secondary | ICD-10-CM

## 2014-01-27 DIAGNOSIS — E039 Hypothyroidism, unspecified: Secondary | ICD-10-CM

## 2014-01-27 MED ORDER — NEBIVOLOL HCL 10 MG PO TABS: 10.0000 mg | ORAL_TABLET | Freq: Every day | ORAL | Status: DC

## 2014-01-27 NOTE — Patient Instructions (Signed)
Your physician has recommended you make the following change in your medication: stop the lopressor. Start new [employer]. Your physician recommends that you schedule a follow-up appointment in: 6 months.

## 2014-01-30 ENCOUNTER — Encounter: Payer: Self-pay | Admitting: Cardiovascular Disease

## 2014-01-30 NOTE — Progress Notes (Addendum)
Patient ID: Molly Giles, female   DOB: 03/06/1956, 58 y.o.   MRN: [id number]      HPI: [name] is a 58 year old female who is followed by Dr. Florina Ou for primary care.  She presents to the office for 4 month cardiology evaluation.  [name] has a 10 year history of hypertension, a six-year history of hyperlipidemia as well as a 4 year history of hypothyroidism. She also has a history of interstitial cystitis. In 2000, she had a stress test and due to mild "shadow" abnormality which I suspect was artifact she ultimately underwent cardiac catheterization by Dr. Tamala Julian and was told of having normal coronary arteries. She has noticed some palpitations. Her brother died on 04/07/23 at age 52 having undergone initial CABG revascularization surgery at age 9 and recently was diagnosed with significant diabetes. Her father died at age 33 with a myocardial infarction. She has noticed some vague back discomfort. She denies any episodes of chest tightness or pressure. She denies shortness of breath. She is was referred for cardiology evaluation following the death of her brother. Recent laboratory which had been done by Dr. year had shown a total cholesterol 153, triglycerides 1:15, HDL 48, VLDL 23, and LDL 82. I did recommend that she undergo a 2-D echo Doppler study the this was done on 06/16/2013 and confirm normal systolic function with an ejection fraction of 55-60%. She did have evidence for mild left ventricular hypertrophy and it was borderline grade 1 diastolic dysfunction. She had normal tissue Doppler assessment suggests normal LV filling pressure. There was evidence for very mild MR and TR. RV systolic pressure was normal.  I had seen her in January 2015, she has experienced occasional palpitations with activity and for the week prior to that visit had noticed almost daily episodes of palpitations. She also complained of some mild congestion. She is tolerating Crestor for hyperlipidemia. She denies  recurrent episodes of chest discomfort.   Since I last saw her, her palpitations have improved.  Her last office visit, her blood pressure was low, reduced her amlodipine from 10-5 mg.  She does note fatigue.  She does have a history of allergies for which he takes Singulair.  She's been bothered by lumbar degenerative disc disease with intermittent sciatica, left leg discomfort.  She presents for evaluation.   Past Medical History  Diagnosis Date  . Mood disorder   . Fibromyalgia   . Hypertension   . Hyperlipidemia   . Migraine   . Hypothyroidism     Past Surgical History  Procedure Laterality Date  . Carpometacarpal joint arthrotomy  2011  . Cardiovascular stress test  2000    Unremarkable per pt report  . Breast biopsy  2011    Benign histology    Allergies  Allergen Reactions  . Codeine Other (See Comments)    Unknown   . Latex     Current Outpatient Prescriptions  Medication Sig Dispense Refill  . montelukast (SINGULAIR) 10 MG tablet Take 10 mg by mouth as needed.       . traZODone (DESYREL) 50 MG tablet Take 50 mg by mouth at bedtime.      Marland Kitchen amLODipine (NORVASC) 5 MG tablet Take 5 mg by mouth daily.       . citalopram (CELEXA) 20 MG tablet Take 1 tablet (20 mg total) by mouth daily.      Marland Kitchen conjugated estrogens (PREMARIN) vaginal cream Place vaginally 2 (two) times a week.      Marland Kitchen  eletriptan (RELPAX) 40 MG tablet One tablet by mouth as needed for migraine headache.  If the headache improves and then returns, dose may be repeated after 2 hours have elapsed since first dose (do not exceed 80 mg per day). may repeat in 2 hours if necessary  10 tablet  0  . ibuprofen (ADVIL,MOTRIN) 200 MG tablet Take 200 mg by mouth as needed for pain.      Marland Kitchen levocetirizine (XYZAL) 5 MG tablet Take 5 mg by mouth as needed.       Marland Kitchen levothyroxine (SYNTHROID, LEVOTHROID) 50 MCG tablet Take 1 tablet (50 mcg total) by mouth daily.  90 tablet  3  . Meth-Hyo-M Bl-Na Phos-Ph Sal (URIBEL) 118 MG  CAPS Take 1 capsule by mouth as needed.      . metoprolol tartrate (LOPRESSOR) 25 MG tablet Take 1 tablet (25 mg total) by mouth 2 (two) times daily.  60 tablet  9  . nebivolol (BYSTOLIC) 10 MG tablet Take 1 tablet (10 mg total) by mouth daily.  30 tablet  6  . nystatin-triamcinolone (MYCOLOG II) cream Apply topically as needed.      . Olopatadine HCl (PATANASE NA) Place 1 spray into the nose as needed.      . pentosan polysulfate (ELMIRON) 100 MG capsule Take 1 capsule (100 mg total) by mouth 3 (three) times daily before meals.      . rosuvastatin (CRESTOR) 10 MG tablet Take 10 mg by mouth daily.      . traMADol (ULTRAM) 50 MG tablet Take 1 tablet (50 mg total) by mouth every 8 (eight) hours as needed for pain.  15 tablet  0  . zolpidem (AMBIEN) 10 MG tablet Take 1 tablet (10 mg total) by mouth at bedtime as needed for sleep.  15 tablet     No current facility-administered medications for this visit.   Socially she is married for 31 years. 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 She went to [employer] for college. There is no alcohol or tobacco use. She exercises approximately one to 2 times per week.  Family History  Problem Relation Age of Onset  . Alzheimer's disease Mother   . Hyperlipidemia Mother   . Hypertension Mother   . Osteoporosis Mother   . Parkinson's disease Mother   . Heart disease Father   . Hyperlipidemia Father   . Hypertension Father   . Migraines Sister   . Allergies Sister   . Hypertension Sister   . Hyperlipidemia Brother   . Cardiomyopathy Brother   . Diabetes type II Brother   . Kidney disease Brother   . Hypertension Brother   . Hyperlipidemia Brother   . Kidney disease Brother   . Diabetes type II    . Breast cancer Maternal Aunt     ROS General: Negative; No fevers, chills, or night sweats;  HEENT: Negative; No changes in vision or hearing, sinus congestion, difficulty swallowing Pulmonary: Negative; No cough, wheezing,  shortness of breath, hemoptysis Cardiovascular: See HPI; No chest pain, presyncope, syncope,  GI: Negative; No nausea, vomiting, diarrhea, or abdominal pain GU: Negative; No dysuria, hematuria, or difficulty voiding Musculoskeletal: Positive for lumbar degenerative disease no myalgias, joint pain, or weakness Hematologic/Oncology: Negative; no easy bruising, bleeding Endocrine: Negative; no heat/cold intolerance; no diabetes Neuro: Episodes of left leg sciatica; no changes in balance, headaches Skin: Negative; No rashes or skin lesions Psychiatric: Negative; No behavioral problems, depression Sleep: Negative; No snoring, daytime sleepiness,  hypersomnolence, bruxism, restless legs, hypnogognic hallucinations, no cataplexy Other comprehensive 14 point system review is negative.  ROS is negative for fever chills or night sweats. She did have a recent posterior vitreous detachment of her right eye. She denies recent change in vision. There is no hearing abnormality the She denies recent chest pain. She denies PND or orthopnea. Denies cough or chest congestion. She denies wheezing. She does have history of migraine headaches. She has noted more for palpitations and typically he notes some mild dizziness as a precursor to a migraine attack. She denies change in bowel or bladder habits. She denies hematuria or hematochezia. She tells me she recently underwent colonoscopy which was normal. She denies claudication. She denies myalgias. She does have interstitial cystitis as well as fibromyalgia. She denies edema. She denies skin rash.  She denies tremors. She denies issues with sleep. Other comprehensive 14 point system review is negative.  PE BP 112/82  Pulse 87  Ht 5' 6.5" (1.689 m)  Wt 216 lb 11.2 oz (98.294 kg)  BMI 34.46 kg/m2 General: Alert, oriented, no distress.  Skin: normal turgor, no rashes HEENT: Normocephalic, atraumatic. Pupils round and reactive; sclera anicteric; Fundi with mild  increased vessel tortuosity without any nicking hemorrhages or exudates Nose without nasal septal hypertrophy Mouth/Parynx benign; Mallinpatti scale 2 Neck: No JVD, no carotid bruits with normal carotid upstroke Lungs: clear to ausculatation and percussion; no wheezing or rales Heart: RRR, s1 s2 normal faint 1/6 systolic murmur; no S3 gallop ; no rub thrill or heave Abdomen: Mild obesity with central adiposity;soft, nontender; no hepatosplenomehaly, BS+; abdominal aorta nontender and not dilated by palpation. Pulses 2+ Extremities: no clubbinbg cyanosis or edema, Homan's sign negative  Neurologic: grossly nonfocal; cranial nerves intact Psychological: Normal affect and mood  ECG (independently read by me): Normal sinus rhythm.  One isolated PVC.  QTc interval 442 ms.  Prior 09/23/2013 ECG (independently read by me): Normal sinus rhythm at 68 beats per minute. No ectopy. Normal intervals.  Prior ECG of 06/30/2013: Normal sinus rhythm at 89 beats per minute. QTc interval 452 ms. PR interval 148 ms.  LABS:  BMET No results found for this basename: na,  k,  cl,  co2,  glucose,  bun,  creatinine,  calcium,  gfrnonaa,  gfraa     Hepatic Function Panel  No results found for this basename: prot,  albumin,  ast,  alt,  alkphos,  bilitot,  bilidir,  ibili     CBC No results found for this basename: wbc,  rbc,  hgb,  hct,  plt,  mcv,  mch,  mchc,  rdw,  neutrabs,  lymphsabs,  monoabs,  eosabs,  basosabs     BNP No results found for this basename: probnp    Lipid Panel  No results found for this basename: chol,  trig,  hdl,  cholhdl,  vldl,  ldlcalc     RADIOLOGY: Mm Digital Screening  05/09/2013   *RADIOLOGY REPORT*  Clinical Data: Screening.  DIGITAL SCREENING BILATERAL MAMMOGRAM WITH CAD DIGITAL BREAST TOMOSYNTHESIS  Digital breast tomosynthesis images are acquired in two projections.  These images are reviewed in combination with the digital mammogram, confirming the findings  below.  Comparison:  11/20/2005  FINDINGS:  ACR Breast Density Category b: There are scattered areas of fibroglandular density.  There is no suspicious dominant mass, architectural distortion, or calcification to suggest malignancy.  Images were processed with CAD.  IMPRESSION: No mammographic evidence of malignancy.  A result letter of this  screening mammogram will be mailed directly to the patient.  RECOMMENDATION: Screening mammogram in one year. (Code:SM-B-01Y)  BI-RADS CATEGORY 1:  Negative.   Original Report Authenticated By: Ulyess Blossom, M.D.     ASSESSMENT AND PLAN: Ms. Turay is a 58 year old female who has a history of hypertension, hyperlipidemia and hypothyroidism. Remotely, coronary angiography revealed normal coronary arteries. She has been without chest pain.  She has been taking metoprolol tartrate 25 mg twice a day for palpitations.  She has noted significant fatigue.  Her blood pressure today has improved on a reduced dose of amlodipine and peritoneal last time when she was hypotensive.  She is tolerating Crestor 10 mg without myalgias.  She does have hypothyroidism and is on Synthroid replacement.  I did review recent blood work by Dr. Johna Roles.  Her TSH was 0.924.  Lipid studies were notable for cholesterol 158, triglycerides 155, LDL cholesterol 101.  She is not anemic.  Electrolytes were normal.  Normal LFTs.  Her fatigability, I recommended she discontinue her metoprolol and I will start her on Bystolic at 10 mg.  Patient also control her palpitations.  As long as she remains stable, I will see her in 6 months for reevaluation.  Troy Sine, MD, [facility] 01/30/2014 9:47 AM

## 2014-04-03 ENCOUNTER — Other Ambulatory Visit: Payer: Self-pay

## 2014-04-03 DIAGNOSIS — Z1231 Encounter for screening mammogram for malignant neoplasm of breast: Secondary | ICD-10-CM

## 2014-05-13 ENCOUNTER — Ambulatory Visit
Admission: RE | Admit: 2014-05-13 | Discharge: 2014-05-13 | Disposition: A | Discharge status: Home / Self Care | Payer: PRIVATE HEALTH INSURANCE | Source: Ambulatory Visit

## 2014-05-13 DIAGNOSIS — Z1231 Encounter for screening mammogram for malignant neoplasm of breast: Secondary | ICD-10-CM

## 2014-05-14 ENCOUNTER — Ambulatory Visit (INDEPENDENT_AMBULATORY_CARE_PROVIDER_SITE_OTHER): Payer: PRIVATE HEALTH INSURANCE | Admitting: Cardiology

## 2014-05-14 ENCOUNTER — Encounter: Payer: Self-pay | Admitting: Cardiology

## 2014-05-14 ENCOUNTER — Telehealth: Payer: Self-pay | Admitting: Cardiovascular Disease

## 2014-05-14 VITALS — BP 120/84 | HR 74 | Ht 66.5 in | Wt 210.1 lb

## 2014-05-14 DIAGNOSIS — I10 Essential (primary) hypertension: Secondary | ICD-10-CM

## 2014-05-14 DIAGNOSIS — IMO0001 Reserved for inherently not codable concepts without codable children: Secondary | ICD-10-CM

## 2014-05-14 DIAGNOSIS — R002 Palpitations: Secondary | ICD-10-CM

## 2014-05-14 DIAGNOSIS — Z8249 Family history of ischemic heart disease and other diseases of the circulatory system: Secondary | ICD-10-CM

## 2014-05-14 DIAGNOSIS — Z0389 Encounter for observation for other suspected diseases and conditions ruled out: Secondary | ICD-10-CM

## 2014-05-14 DIAGNOSIS — E785 Hyperlipidemia, unspecified: Secondary | ICD-10-CM

## 2014-05-14 HISTORY — DX: Reserved for inherently not codable concepts without codable children: IMO0001

## 2014-05-14 NOTE — Telephone Encounter (Signed)
Called pt back and scheduled her to see Lurena Joiner today at 1:30pm

## 2014-05-14 NOTE — Assessment & Plan Note (Signed)
On Crestor 

## 2014-05-14 NOTE — Assessment & Plan Note (Signed)
Normal coronaries in 2000

## 2014-05-14 NOTE — Progress Notes (Signed)
05/14/2014 Molly Giles   1956/02/26  [id number]  Primary Physicia Molly Ou, MD Primary Cardiologist: Dr Molly Giles  HPI:  Pt is a 58 year old female who is followed by Dr. Florina Giles. She has a history of HTN, dyslipidemia, and a strong family history of early CAD. She had a cath in 2000 that showed normal coronaries. Echo in Oct 2014 showed Nl LVF. She has had problems with palpitations and has documented PVCs. She was placed on Bystolic by Dr Molly Giles Jan 2841 (changed from Metoprolol). She came to the office today as an add on with compliants of fatigue, headache, palpitations, and flushing. She is Bystolic 5 mg, though I believe we though she was on 10 mg.     Current Outpatient Prescriptions  Medication Sig Dispense Refill  . amLODipine (NORVASC) 5 MG tablet Take 5 mg by mouth daily.       . citalopram (CELEXA) 20 MG tablet Take 1 tablet (20 mg total) by mouth daily.      Marland Kitchen conjugated estrogens (PREMARIN) vaginal cream Place vaginally 2 (two) times a week.      . eletriptan (RELPAX) 40 MG tablet One tablet by mouth as needed for migraine headache.  If the headache improves and then returns, dose may be repeated after 2 hours have elapsed since first dose (do not exceed 80 mg per day). may repeat in 2 hours if necessary  10 tablet  0  . ibuprofen (ADVIL,MOTRIN) 200 MG tablet Take 200 mg by mouth as needed for pain.      Marland Kitchen levothyroxine (SYNTHROID, LEVOTHROID) 50 MCG tablet Take 1 tablet (50 mcg total) by mouth daily.  90 tablet  3  . Meth-Hyo-M Bl-Na Phos-Ph Sal (URIBEL) 118 MG CAPS Take 1 capsule by mouth as needed.      . montelukast (SINGULAIR) 10 MG tablet Take 10 mg by mouth as needed.       . nebivolol (BYSTOLIC) 10 MG tablet Take 5 mg by mouth daily.      Marland Kitchen nystatin-triamcinolone (MYCOLOG II) cream Apply topically as needed.      . Olopatadine HCl (PATANASE NA) Place 1 spray into the nose as needed.      . pentosan polysulfate (ELMIRON) 100 MG capsule Take 100 mg by mouth 2 (two)  times daily before a meal.       . rosuvastatin (CRESTOR) 10 MG tablet Take 10 mg by mouth daily.      . traMADol (ULTRAM) 50 MG tablet Take 1 tablet (50 mg total) by mouth every 8 (eight) hours as needed for pain.  15 tablet  0  . traZODone (DESYREL) 50 MG tablet Take 50 mg by mouth at bedtime.      Marland Kitchen zolpidem (AMBIEN) 10 MG tablet Take 1 tablet (10 mg total) by mouth at bedtime as needed for sleep.  15 tablet     No current facility-administered medications for this visit.    Allergies  Allergen Reactions  . Codeine Other (See Comments)    Unknown   . Latex   . Lyrica [Pregabalin] Other (See Comments)    Dizziness   . Sulfa Antibiotics Itching    History   Social History  . Marital Status: Married    Spouse Name: N/A    Number of Children: N/A  . Years of Education: N/A   Occupational History  . Not on file.   Social History Main Topics  . Smoking status: Passive Smoke Exposure - Never Smoker  .  Smokeless tobacco: Never Used  . Alcohol Use: No  . Drug Use: Not on file  . Sexual Activity: Not on file   Other Topics Concern  . Not on file   Social History Narrative   Prior PCP With [facility] at [place], [place].   Married, lives with Husaband (b. 1955)   Transoportation by personal vehicle.    No religious beliefs affecting healthcare     Review of Systems: General: negative for chills, fever, night sweats or weight changes.  Cardiovascular: negative for chest pain, dyspnea on exertion, edema, orthopnea, palpitations, paroxysmal nocturnal dyspnea or shortness of breath Dermatological: negative for rash Respiratory: negative for cough or wheezing Urologic: negative for hematuria Abdominal: negative for nausea, vomiting, diarrhea, bright red blood per rectum, melena, or hematemesis Neurologic: negative for visual changes, syncope, or dizziness All other systems reviewed and are otherwise negative except as noted above.    Blood pressure  120/84, pulse 74, height 5' 6.5" (1.689 m), weight 210 lb 1.6 oz (95.301 kg).  General appearance: alert, cooperative, no distress, moderately obese and anxious Neck: no carotid bruit and no JVD Lungs: clear to auscultation bilaterally Heart: regular rate and rhythm  EKG NSR, one PVC  ASSESSMENT AND PLAN:   Palpitations Pt seen as an add on for palpitations.  Essential hypertension Controlled  Hyperlipidemia On Crestor  Hypothyroidism TSH recently checked by her PCP-"OK"  Family history of premature CAD Strong family history of early CAD  Normal coronary arteries Normal coronaries in 2000   PLAN  I suggested Molly Giles increase her Bystolic to 10 mg for her palpitations. Her PCP is dealing with her headaches. I suggested she contact her OB/GYN about her flushing, she is on Premarin. She is going to [place] in a few weeks and "I need to feel better than I do". Some of her complaints are myalgias and fatigue. I suggested she try a statin holiday in a few weeks if her symptoms persist. She has an apt with Dr Molly Giles in NovKerin Giles KPA-C 05/14/2014 2:22 PM

## 2014-05-14 NOTE — Assessment & Plan Note (Signed)
Strong family history of early CAD

## 2014-05-14 NOTE — Assessment & Plan Note (Signed)
Controlled.  

## 2014-05-14 NOTE — Patient Instructions (Signed)
Increase Bystolic to 10mg   In 2 weeks if you are still not feeling well and having the pains you may stop the Crestor for 2 weeks   Keep your scheduled appointment with [facility] in November

## 2014-05-14 NOTE — Assessment & Plan Note (Signed)
Pt seen as an add on for palpitations.

## 2014-05-14 NOTE — Telephone Encounter (Signed)
Spoke with pt, since august 9th  She has had increased palpitations. She reports facal sweating for no reason, also night sweats. She has been taking 10 mg of bystolic for the last 3 days with no change in symptoms. She reports her tsh is normal. She is also c/o SOB and weakness that is not getting any better. She will see Kerin Ransom pa today .

## 2014-05-14 NOTE — Assessment & Plan Note (Signed)
TSH recently checked by her PCP-"OK"

## 2014-05-14 NOTE — Telephone Encounter (Signed)
Pt called in stating that she has been having palpitaions, sweating, weakness, dizziness, headaches, and muscles knots in her neck. She would like to be seen as soon as possible if she could. Please call  Thanks

## 2014-07-28 ENCOUNTER — Ambulatory Visit: Payer: No Typology Code available for payment source | Admitting: Cardiovascular Disease

## 2014-09-11 HISTORY — PX: OTHER SURGICAL HISTORY: SHX169

## 2014-09-17 ENCOUNTER — Ambulatory Visit: Payer: PRIVATE HEALTH INSURANCE | Admitting: Cardiovascular Disease

## 2014-10-19 ENCOUNTER — Ambulatory Visit: Payer: PRIVATE HEALTH INSURANCE | Admitting: Cardiovascular Disease

## 2014-12-14 ENCOUNTER — Ambulatory Visit (INDEPENDENT_AMBULATORY_CARE_PROVIDER_SITE_OTHER): Payer: PRIVATE HEALTH INSURANCE | Admitting: Cardiovascular Disease

## 2014-12-14 VITALS — BP 112/70 | HR 62 | Ht 66.5 in | Wt 215.1 lb

## 2014-12-14 DIAGNOSIS — E039 Hypothyroidism, unspecified: Secondary | ICD-10-CM

## 2014-12-14 DIAGNOSIS — E785 Hyperlipidemia, unspecified: Secondary | ICD-10-CM | POA: Diagnosis not present

## 2014-12-14 DIAGNOSIS — I10 Essential (primary) hypertension: Secondary | ICD-10-CM | POA: Diagnosis not present

## 2014-12-14 DIAGNOSIS — R002 Palpitations: Secondary | ICD-10-CM | POA: Diagnosis not present

## 2014-12-14 DIAGNOSIS — Z8249 Family history of ischemic heart disease and other diseases of the circulatory system: Secondary | ICD-10-CM

## 2014-12-14 DIAGNOSIS — K219 Gastro-esophageal reflux disease without esophagitis: Secondary | ICD-10-CM

## 2014-12-14 LAB — COMPREHENSIVE METABOLIC PANEL
ALBUMIN: 3.9 g/dL (ref 3.5–5.2)
ALT: 16 U/L (ref 0–35)
AST: 16 U/L (ref 0–37)
Alkaline Phosphatase: 66 U/L (ref 39–117)
BUN: 10 mg/dL (ref 6–23)
CHLORIDE: 105 meq/L (ref 96–112)
CO2: 25 meq/L (ref 19–32)
Calcium: 8.9 mg/dL (ref 8.4–10.5)
Creat: 0.72 mg/dL (ref 0.50–1.10)
GLUCOSE: 90 mg/dL (ref 70–99)
POTASSIUM: 4.1 meq/L (ref 3.5–5.3)
SODIUM: 141 meq/L (ref 135–145)
TOTAL PROTEIN: 6.3 g/dL (ref 6.0–8.3)
Total Bilirubin: 0.3 mg/dL (ref 0.2–1.2)

## 2014-12-14 LAB — CBC
HEMATOCRIT: 39.5 % (ref 36.0–46.0)
Hemoglobin: 12.8 g/dL (ref 12.0–15.0)
MCH: 28.8 pg (ref 26.0–34.0)
MCHC: 32.4 g/dL (ref 30.0–36.0)
MCV: 89 fL (ref 78.0–100.0)
MPV: 10.4 fL (ref 8.6–12.4)
PLATELETS: 350 10*3/uL (ref 150–400)
RBC: 4.44 MIL/uL (ref 3.87–5.11)
RDW: 13.9 % (ref 11.5–15.5)
WBC: 10.4 10*3/uL (ref 4.0–10.5)

## 2014-12-14 LAB — LIPID PANEL
CHOL/HDL RATIO: 3.9 ratio
Cholesterol: 138 mg/dL (ref 0–200)
HDL: 35 mg/dL — AB (ref >=46)
LDL CALC: 77 mg/dL (ref 0–99)
TRIGLYCERIDES: 130 mg/dL (ref <150)
VLDL: 26 mg/dL (ref 0–40)

## 2014-12-14 LAB — TSH: TSH: 5.49 u[IU]/mL — ABNORMAL HIGH (ref 0.350–4.500)

## 2014-12-14 NOTE — Patient Instructions (Signed)
Your physician recommends that you return for lab work today.   Your physician wants you to follow-up in: 6 months or sooner if needed with Dr. Claiborne Billings. You will receive a reminder letter in the mail two months in advance. If you don't receive a letter, please call our office to schedule the follow-up appointment.

## 2014-12-16 ENCOUNTER — Encounter: Payer: Self-pay | Admitting: Cardiovascular Disease

## 2014-12-16 DIAGNOSIS — K219 Gastro-esophageal reflux disease without esophagitis: Secondary | ICD-10-CM

## 2014-12-16 HISTORY — DX: Gastro-esophageal reflux disease without esophagitis: K21.9

## 2014-12-16 NOTE — Progress Notes (Signed)
Patient ID: Molly Giles, female   DOB: [birth year], 59 y.o.   MRN: [id number]      HPI: Molly Giles is a 59 year old female who has been followed by Dr. Florina Ou for primary care.  She presents to the office for a 11 month cardiology evaluation.  Ms. Caudle has a  history of hypertension, hyperlipidemia and hypothyroidism. She also has a history of interstitial cystitis. In 2000, she had a stress test and due to mild "shadow" abnormality which I suspect was artifact she ultimately underwent cardiac catheterization by Dr. Tamala Julian and was told of having normal coronary arteries. She has noticed some palpitations. Her brother died at age 83 having undergone initial CABG revascularization surgery at age 65. Her father died at age 60 with a myocardial infarction. She has noticed some vague back discomfort. She denies any episodes of chest tightness or pressure. She denies shortness of breath. She was referred for cardiology evaluation following the death of her brother.   A 2-D echo Doppler study on 06/16/2013 showed normal systolic function with an ejection fraction of 55-60%. She did have evidence for mild left ventricular hypertrophy and it was borderline grade 1 diastolic dysfunction. She had normal tissue Doppler assessment suggests normal LV filling pressure. There was evidence for very mild MR and TR. RV systolic pressure was normal.  He has a history of palpitations which have improved with Bystolic.  She had developed low blood pressure in the past and her dose of amlodipine was reduced.  She tells me now that Dr. Modena Morrow no longer be returning to her practice.   Past Medical History  Diagnosis Date  . Mood disorder   . Fibromyalgia   . Hypertension   . Hyperlipidemia   . Migraine   . Hypothyroidism     Past Surgical History  Procedure Laterality Date  . Carpometacarpal joint arthrotomy  2011  . Cardiovascular stress test  2000    Unremarkable per pt report  . Breast biopsy  2011   Benign histology    Allergies  Allergen Reactions  . Codeine Other (See Comments)    Unknown   . Latex   . Lyrica [Pregabalin] Other (See Comments)    Dizziness   . Sulfa Antibiotics Itching    Current Outpatient Prescriptions  Medication Sig Dispense Refill  . amLODipine (NORVASC) 5 MG tablet Take 5 mg by mouth daily.     . citalopram (CELEXA) 20 MG tablet Take 1 tablet (20 mg total) by mouth daily.    Marland Kitchen conjugated estrogens (PREMARIN) vaginal cream Place vaginally 2 (two) times a week.    . eletriptan (RELPAX) 40 MG tablet One tablet by mouth as needed for migraine headache.  If the headache improves and then returns, dose may be repeated after 2 hours have elapsed since first dose (do not exceed 80 mg per day). may repeat in 2 hours if necessary 10 tablet 0  . ibuprofen (ADVIL,MOTRIN) 200 MG tablet Take 200 mg by mouth as needed for pain.    Marland Kitchen levothyroxine (SYNTHROID, LEVOTHROID) 50 MCG tablet Take 1 tablet (50 mcg total) by mouth daily. 90 tablet 3  . Meth-Hyo-M Bl-Na Phos-Ph Sal (URIBEL) 118 MG CAPS Take 1 capsule by mouth as needed.    . montelukast (SINGULAIR) 10 MG tablet Take 10 mg by mouth as needed.     . nebivolol (BYSTOLIC) 10 MG tablet Take 5 mg by mouth daily.    Marland Kitchen nystatin-triamcinolone (MYCOLOG II) cream Apply topically as needed.    Marland Kitchen  Olopatadine HCl (PATANASE NA) Place 1 spray into the nose as needed.    Marland Kitchen PATADAY 0.2 % SOLN Place 1 drop into both eyes daily.  3  . pentosan polysulfate (ELMIRON) 100 MG capsule Take 100 mg by mouth 2 (two) times daily before a meal.     . PREMPRO 0.625-2.5 MG per tablet Take 1 tablet by mouth daily.  0  . ranitidine (ZANTAC) 150 MG tablet Take 1 tablet by mouth as needed.  0  . rosuvastatin (CRESTOR) 10 MG tablet Take 10 mg by mouth daily.    . traMADol (ULTRAM) 50 MG tablet Take 1 tablet (50 mg total) by mouth every 8 (eight) hours as needed for pain. 15 tablet 0  . traZODone (DESYREL) 50 MG tablet Take 50 mg by mouth at bedtime.     . Vancomycin HCl (VANCOMYCIN+SYRSPEND SF PH4) 50 MG/ML SUSP Take 2.5 mLs by mouth 4 (four) times daily.    Marland Kitchen zolpidem (AMBIEN) 10 MG tablet Take 1 tablet (10 mg total) by mouth at bedtime as needed for sleep. 15 tablet    No current facility-administered medications for this visit.   Socially she is married for 32 years. 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 She went to [employer] for college. There is no alcohol or tobacco use. She exercises approximately one to 2 times per week.  Family History  Problem Relation Age of Onset  . Alzheimer's disease Mother   . Hyperlipidemia Mother   . Hypertension Mother   . Osteoporosis Mother   . Parkinson's disease Mother   . Heart disease Father   . Hyperlipidemia Father   . Hypertension Father   . Migraines Sister   . Allergies Sister   . Hypertension Sister   . Hyperlipidemia Brother   . Cardiomyopathy Brother   . Diabetes type II Brother   . Kidney disease Brother   . Hypertension Brother   . Hyperlipidemia Brother   . Kidney disease Brother   . Diabetes type II    . Breast cancer Maternal Aunt     ROS General: Negative; No fevers, chills, or night sweats;  HEENT: Negative; No changes in vision or hearing, sinus congestion, difficulty swallowing Pulmonary: Negative; No cough, wheezing, shortness of breath, hemoptysis Cardiovascular: See HPI; No chest pain, presyncope, syncope,  GI: Negative; No nausea, vomiting, diarrhea, or abdominal pain GU: Negative; No dysuria, hematuria, or difficulty voiding Musculoskeletal: Positive for lumbar degenerative disease no myalgias, joint pain, or weakness Hematologic/Oncology: Negative; no easy bruising, bleeding Endocrine: Negative; no heat/cold intolerance; no diabetes Neuro: Episodes of left leg sciatica; no changes in balance, headaches Skin: Negative; No rashes or skin lesions Psychiatric: Negative; No behavioral problems, depression Sleep: Negative; No snoring,  daytime sleepiness, hypersomnolence, bruxism, restless legs, hypnogognic hallucinations, no cataplexy Other comprehensive 14 point system review is negative.  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; no change in hearing, sinus congestion, difficulty swallowing Pulmonary: Negative; No cough, wheezing, shortness of breath, hemoptysis Cardiovascular: Negative; No chest pain, presyncope, syncope, palpitations GI: Negative; No nausea, vomiting, diarrhea, or abdominal pain GU: Negative; No dysuria, hematuria, or difficulty voiding Musculoskeletal: Negative; no myalgias, joint pain, or weakness Hematologic/Oncology: Negative; no easy bruising, bleeding Endocrine: Negative; no heat/cold intolerance; no diabetes Neuro: Negative; no changes in balance, headaches Skin: Negative; No rashes or skin lesions Psychiatric: Negative; No behavioral problems, depression Sleep: Negative; No snoring, daytime  sleepiness, hypersomnolence, bruxism, restless legs, hypnogognic hallucinations, no cataplexy Other comprehensive 14 point system review is negative.  PE BP 112/70 mmHg  Pulse 62  Ht 5' 6.5" (1.689 m)  Wt 215 lb 1.6 oz (97.569 kg)  BMI 34.20 kg/m2 General: Alert, oriented, no distress.  Skin: normal turgor, no rashes HEENT: Normocephalic, atraumatic. Pupils round and reactive; sclera anicteric; Fundi with mild increased vessel tortuosity without any nicking hemorrhages or exudates Nose without nasal septal hypertrophy Mouth/Parynx benign; Mallinpatti scale 2 Neck: No JVD, no carotid bruits with normal carotid upstroke Lungs: clear to ausculatation and percussion; no wheezing or rales Heart: RRR, s1 s2 normal faint 1/6 systolic murmur; no S3 gallop ; no rub thrill or heave Abdomen: Mild obesity with central adiposity;soft, nontender; no hepatosplenomehaly, BS+; abdominal aorta nontender and not dilated by  palpation. Pulses 2+ Extremities: no clubbinbg cyanosis or edema, Homan's sign negative  Neurologic: grossly nonfocal; cranial nerves intact Psychological: Normal affect and mood  ECG (independently read by me): Normal sinus rhythm at 62 bpm.  No ectopy.  Normal intervals.  May 2015 ECG (independently read by me): Normal sinus rhythm.  One isolated PVC.  QTc interval 442 ms.  Prior 09/23/2013 ECG (independently read by me): Normal sinus rhythm at 68 beats per minute. No ectopy. Normal intervals.  Prior ECG of 06/30/2013: Normal sinus rhythm at 89 beats per minute. QTc interval 452 ms. PR interval 148 ms.  LABS:  BMET  BMP Latest Ref Rng 12/14/2014  Glucose 70 - 99 mg/dL 90  BUN 6 - 23 mg/dL 10  Creatinine 0.50 - 1.10 mg/dL 0.72  Sodium 135 - 145 mEq/L 141  Potassium 3.5 - 5.3 mEq/L 4.1  Chloride 96 - 112 mEq/L 105  CO2 19 - 32 mEq/L 25  Calcium 8.4 - 10.5 mg/dL 8.9     Hepatic Function Panel   Hepatic Function Latest Ref Rng 12/14/2014  Total Protein 6.0 - 8.3 g/dL 6.3  Albumin 3.5 - 5.2 g/dL 3.9  AST 0 - 37 U/L 16  ALT 0 - 35 U/L 16  Alk Phosphatase 39 - 117 U/L 66  Total Bilirubin 0.2 - 1.2 mg/dL 0.3     CBC  CBC Latest Ref Rng 12/14/2014  WBC 4.0 - 10.5 K/uL 10.4  Hemoglobin 12.0 - 15.0 g/dL 12.8  Hematocrit 36.0 - 46.0 % 39.5  Platelets 150 - 400 K/uL 350     BNP No results found for: PROBNP  Lipid Panel   Lipid Panel     Component Value Date/Time   CHOL 138 12/14/2014 1036   TRIG 130 12/14/2014 1036   HDL 35* 12/14/2014 1036   CHOLHDL 3.9 12/14/2014 1036   VLDL 26 12/14/2014 1036   LDLCALC 77 12/14/2014 1036     RADIOLOGY: Mm Digital Screening  05/09/2013   *RADIOLOGY REPORT*  Clinical Data: Screening.  DIGITAL SCREENING BILATERAL MAMMOGRAM WITH CAD DIGITAL BREAST TOMOSYNTHESIS  Digital breast tomosynthesis images are acquired in two projections.  These images are reviewed in combination with the digital mammogram, confirming the findings below.   Comparison:  11/20/2005  FINDINGS:  ACR Breast Density Category b: There are scattered areas of fibroglandular density.  There is no suspicious dominant mass, architectural distortion, or calcification to suggest malignancy.  Images were processed with CAD.  IMPRESSION: No mammographic evidence of malignancy.  A result letter of this screening mammogram will be mailed directly to the patient.  RECOMMENDATION: Screening mammogram in one year. (Code:SM-B-01Y)  BI-RADS CATEGORY 1:  Negative.  Original Report Authenticated By: Ulyess Blossom, M.D.     ASSESSMENT AND PLAN: Ms. Perry is a 59 year old female who has a history of hypertension, hyperlipidemia and hypothyroidism. Remotely, coronary angiography revealed normal coronary arteries. She has been without chest pain.  He has a history of palpitations and remotely had been on metoprolol but most recently is doing well with Bystolic with much less fatigability.  Her blood pressure today is stable on her current dose of amlodipine 5 mg in addition to her Bystolic regimen.  She does have hypothyroidism and is taking levothyroxine 50 g daily.  Her GERD is controlled with ranitidine.  She is tolerating Crestor 10 mg daily and denies myalgias.  I have recommended repeat laboratory be done today.  This will be reviewed and adjustments will be made if necessary to her medical regimen.  As long as she remains stable, I will see her in 6 months for reevaluation or sooner if problems arise.    Troy Sine, MD, [facility] 12/16/2014 6:28 PM

## 2014-12-17 ENCOUNTER — Telehealth: Payer: Self-pay | Admitting: Cardiovascular Disease

## 2014-12-17 DIAGNOSIS — E039 Hypothyroidism, unspecified: Secondary | ICD-10-CM

## 2014-12-17 DIAGNOSIS — E785 Hyperlipidemia, unspecified: Secondary | ICD-10-CM

## 2014-12-17 NOTE — Telephone Encounter (Signed)
Informed pt of lab results.   TSH was up - does she need any adjustment on her synthroid?  Also pt asking about referral to [place] program - she would need referral from [name] for insurance purposes.

## 2014-12-17 NOTE — Telephone Encounter (Signed)
Pt called in stating that she had some blood work done on 4/4 and she would like to know if the results were back in yet and she would also like to know that based on her results if she needs to continue to take her Bystolic. Please f/u with pt  Thanks

## 2014-12-22 NOTE — Telephone Encounter (Signed)
Can this encounter be closed?

## 2014-12-22 NOTE — Telephone Encounter (Signed)
Message routed to Dr. Claiborne Billings & Mariann Laster. Lab results have come in but no result note. Patient would like to know about levothyroxine and referral for [facility]

## 2014-12-28 NOTE — Telephone Encounter (Signed)
Pt has a h/o palpitations. TSH is only minimally elevated at 5.49 on levothyroxine at 50 mcg;  Would re check in 2 more weeks, if still increased then can inc thryoid med to 62.5 mcg.  Refer to [facility]

## 2014-12-28 NOTE — Telephone Encounter (Signed)
Patient notified of instructions per Dr. Claiborne Billings. She will have labs in 2 weeks - lab ordered and slip mailed to patient. Referral to nutrition services ordered

## 2014-12-28 NOTE — Telephone Encounter (Signed)
LMTCB

## 2015-01-19 ENCOUNTER — Other Ambulatory Visit: Payer: Self-pay | Admitting: *Deleted

## 2015-01-19 DIAGNOSIS — E785 Hyperlipidemia, unspecified: Secondary | ICD-10-CM

## 2015-01-25 ENCOUNTER — Telehealth: Payer: Self-pay | Admitting: *Deleted

## 2015-01-25 MED ORDER — LEVOTHYROXINE SODIUM 125 MCG PO TABS
ORAL_TABLET | ORAL | Status: DC
Start: 2015-01-25 — End: 2016-02-28

## 2015-01-25 NOTE — Telephone Encounter (Signed)
-----   Message from Troy Sine, MD sent at 01/24/2015  7:10 PM EDT ----- Labs good x hdl low; TSH mildly elevated; inc synthroid to .0625 mg from .05

## 2015-01-25 NOTE — Telephone Encounter (Signed)
Called to inform patient of lab results and recommendations. She voiced her understanding of the orders given. Per Ouida Sills to get to the ordered dose I will need to order 0.125 mcg and have patient to take 1/2 tablet daily.

## 2015-01-26 ENCOUNTER — Telehealth: Payer: Self-pay | Admitting: Cardiovascular Disease

## 2015-01-26 NOTE — Telephone Encounter (Signed)
Informed patient that Rx was sent in 01/25/15 @ 435pm. She states she went to pharmacy yesterday before then and med was not ready. She states she call this AM and med was not ready. Called to pharmacy, med sold this AM to patient's husband. Informed patient of this.

## 2015-01-26 NOTE — Telephone Encounter (Signed)
°  1. Which medications need to be refilled? Synthroid .625mg   2. Which pharmacy is medication to be sent to?[facility] on [address] [phone]  3. Do they need a 30 day or 90 day supply? 30  4. Would they like a call back once the medication has been sent to the pharmacy? Yes

## 2015-01-29 ENCOUNTER — Encounter: Payer: Self-pay | Admitting: Skilled Nursing Facility1

## 2015-01-29 ENCOUNTER — Encounter: Payer: PRIVATE HEALTH INSURANCE | Attending: Family Medicine | Admitting: Skilled Nursing Facility1

## 2015-01-29 VITALS — Ht 66.0 in | Wt 215.0 lb

## 2015-01-29 DIAGNOSIS — E785 Hyperlipidemia, unspecified: Secondary | ICD-10-CM | POA: Diagnosis present

## 2015-01-29 DIAGNOSIS — Z713 Dietary counseling and surveillance: Secondary | ICD-10-CM | POA: Insufficient documentation

## 2015-01-29 NOTE — Progress Notes (Signed)
  Medical Nutrition Therapy:  Appt start time: 1100 end time:  1200.   Assessment:  Primary concerns today: Referred for hyperlipidemia. Pt states she has struggled with her wt for many years especially the last 15 years and wants to lose wt and eat better also states her belly never goes away. Pt states she lost 30 pounds. Pt states 2 years ago, brother passed away due to heart disease and so fears diabetes for herself. Pt states she tried a doctor sponsored wt loss program ([facility]) (30 pounds weight loss)-prepackaged foods-shakes-bars for 6 months; pt states she was tired of eating the same way every day. Pt states she also tried another diet a few years ago and tried nutrisystem and weight watchers none of which resulted in significant weight loss. Pt states she has the biggest problem with night time snacking. Pt states she needs ambien to sleep which adds to her night time eating.  Pt states her fibromyalgia hinders her physical activity. Pt states she works part time.  Preferred Learning Style:   Auditory  Learning Readiness:   Ready   MEDICATIONS: See List   DIETARY INTAKE:  Usual eating pattern includes 2 meals and 3 snacks per day.  Everyday foods include none stated.  Avoided foods include none stated.    24-hr recall:  B ( AM): Kind bar or breakfast bar or banana  Snk ( AM): wheat thins, crackers, skinny pop popcorn L ( PM): salad or sandwhich Snk ( PM): wheat thins, crackers, skinny pop popcorn D ( PM): burger or chicken with sweet potato fries or salad Snk ( PM): wheat thins, crackers, skinny pop popcorn Beverages: coffee with stevia and fat free cream,   *Eats out with her friends twice a week  Usual physical activity: Walking 2 days a week for 15 minutes  Estimated energy needs: 1600 calories 180 g carbohydrates 120 g protein 44 g fat  Progress Towards Goal(s):  In progress.   Nutritional Diagnosis:  [place]-3.3 Overweight/obesity As related to calrically dense  foods with multipe episodes of nighttime snacking.  As evidenced by BMI of 34.70 and pt report.    Intervention:  Nutrition counseling for obesity. Dietitian educated the pt on balanced/varied meals, physical activity, and eating three meals a day and 2-3 snacks.  Goals: -Try to eat 3 meals a day and 2-3 snacks -Try to increase your fitness level so you can suggest walking with your friends  Teaching Method Utilized:  Visual Auditory  Handouts given during visit include:  15 gram carbohydrate snack sheet  MyPlate  Barriers to learning/adherence to lifestyle change: behavior change  Demonstrated degree of understanding via:  Teach Back   Monitoring/Evaluation:  Dietary intake, exercise, and body weight in 1 month(s).

## 2015-02-10 ENCOUNTER — Ambulatory Visit: Payer: PRIVATE HEALTH INSURANCE | Admitting: Dietician

## 2015-02-10 DIAGNOSIS — Z9181 History of falling: Secondary | ICD-10-CM

## 2015-02-10 DIAGNOSIS — R2689 Other abnormalities of gait and mobility: Secondary | ICD-10-CM

## 2015-02-10 HISTORY — DX: Other abnormalities of gait and mobility: R26.89

## 2015-02-10 HISTORY — DX: History of falling: Z91.81

## 2015-03-05 ENCOUNTER — Ambulatory Visit: Payer: PRIVATE HEALTH INSURANCE | Admitting: Skilled Nursing Facility1

## 2015-03-13 ENCOUNTER — Other Ambulatory Visit: Payer: Self-pay | Admitting: Cardiovascular Disease

## 2015-03-16 NOTE — Telephone Encounter (Signed)
REFILL 

## 2015-04-06 ENCOUNTER — Telehealth: Payer: Self-pay | Admitting: Cardiovascular Disease

## 2015-04-06 ENCOUNTER — Telehealth: Payer: Self-pay | Admitting: Internal Medicine

## 2015-04-06 MED ORDER — ROSUVASTATIN CALCIUM 10 MG PO TABS: 10.0000 mg | ORAL_TABLET | Freq: Every day | ORAL | Status: DC

## 2015-04-06 NOTE — Telephone Encounter (Signed)
Did not need this encounter °

## 2015-04-06 NOTE — Telephone Encounter (Signed)
Spoke to patient Confirmed with patient-will be okay to follow cholesterol medication and labs. Medication e- sent to pharmacy. appointmentt schedule with Dr Claiborne Billings FOR OCT [phone]. INFORMED PATIENT TO CALL SEPT 2016 FOR REQUEST FOR LABS  CMP , LIPID. PATIENT HAD LAST SET OF LABS IN 4 /2016. PATIENT VOICED UNDERSTANDING.

## 2015-04-06 NOTE — Telephone Encounter (Signed)
Pt was getting her cholesterol medicine from her primary doctor,but she retired. She wants Dr Claiborne Billings to take over her Cholesterol medicine and blood work for her cholesterol. Please call in the generic Crestor to [facility]-[phone].

## 2015-04-08 ENCOUNTER — Encounter: Payer: Self-pay | Admitting: Internal Medicine

## 2015-04-08 ENCOUNTER — Ambulatory Visit (INDEPENDENT_AMBULATORY_CARE_PROVIDER_SITE_OTHER): Payer: PRIVATE HEALTH INSURANCE | Admitting: Internal Medicine

## 2015-04-08 VITALS — BP 123/81 | HR 83 | Temp 98.5°F | Wt 215.0 lb

## 2015-04-08 DIAGNOSIS — A0471 Enterocolitis due to Clostridium difficile, recurrent: Secondary | ICD-10-CM

## 2015-04-08 DIAGNOSIS — A047 Enterocolitis due to Clostridium difficile: Secondary | ICD-10-CM

## 2015-04-08 NOTE — Progress Notes (Signed)
RFV: eval for cdifficile and FMT Subjective:    Patient ID: Molly Giles, female    DOB: 1955/11/09, 59 y.o.   MRN: [id number]  HPI  59yo F with history of interstitial cystitis, who occasionally gets antibiotics for treatment. She had been treated with 2 course of abtx in spring 2016 but also skin infection, thus had been on abtx for 4 wk. She subsequently developed frequent diarrhea. She was 1st diagnosed on March 28th with 1st episode of c.difficile. 1st time the worst time. Felt poorly for 1 week. Given ppi that didn't help. Just prior to this event, had bladder infection, where she was given 2 courses of antibiotics  (cipro, which didn't work and was resistant , then given microdantin , then developed boil requiring lancing, and 2 course of doxycycline. Thus on abtx 4 wks). She was treated with Liquid vancomycin 125mg  x 14 days. Worked quickly felt better. She felt having a recurrence 10 days after finishing course of therapy. On April 22nd, she was given a course of vancomycin pill 125mg  X 14 days. Did well again  End of May/beg of June she was diagnosed with 3rd episode, she was given vanco taper in june  Now again dx in July with vanco taper with florastor. Currently of 1st day of 1 every 3 days. Has 2-3 bm daily. ( which is her baseline). Abdominal cramping in RLQ, loose stools, occuring post prandial. Within 1 hr.  Weight loss = 5lb on initial episode, but now back to baseline weight   Allergies  Allergen Reactions  . Codeine Other (See Comments)    Unknown   . Latex   . Lyrica [Pregabalin] Other (See Comments)    Dizziness   . Sulfa Antibiotics Itching   Current Outpatient Prescriptions on File Prior to Visit  Medication Sig Dispense Refill  . amLODipine (NORVASC) 5 MG tablet Take 5 mg by mouth daily.     Marland Kitchen BYSTOLIC 10 MG tablet TAKE 1 TABLET BY MOUTH DAILY 30 tablet 3  . citalopram (CELEXA) 20 MG tablet Take 1 tablet (20 mg total) by mouth daily.    Marland Kitchen eletriptan (RELPAX)  40 MG tablet One tablet by mouth as needed for migraine headache.  If the headache improves and then returns, dose may be repeated after 2 hours have elapsed since first dose (do not exceed 80 mg per day). may repeat in 2 hours if necessary 10 tablet 0  . ibuprofen (ADVIL,MOTRIN) 200 MG tablet Take 200 mg by mouth as needed for pain.    Marland Kitchen levothyroxine (SYNTHROID, LEVOTHROID) 125 MCG tablet Take  0.5 tablet by mouth daily 15 tablet 6  . Meth-Hyo-M Bl-Na Phos-Ph Sal (URIBEL) 118 MG CAPS Take 1 capsule by mouth as needed.    . montelukast (SINGULAIR) 10 MG tablet Take 10 mg by mouth as needed.     . nystatin-triamcinolone (MYCOLOG II) cream Apply topically as needed.    . Olopatadine HCl (PATANASE NA) Place 1 spray into the nose as needed.    Marland Kitchen PATADAY 0.2 % SOLN Place 1 drop into both eyes daily.  3  . pentosan polysulfate (ELMIRON) 100 MG capsule Take 100 mg by mouth 2 (two) times daily before a meal.     . PREMPRO 0.625-2.5 MG per tablet Take 1 tablet by mouth daily.  0  . rosuvastatin (CRESTOR) 10 MG tablet Take 1 tablet (10 mg total) by mouth daily. 90 tablet 2  . traMADol (ULTRAM) 50 MG tablet Take 1 tablet (50  mg total) by mouth every 8 (eight) hours as needed for pain. 15 tablet 0  . Vancomycin HCl (VANCOMYCIN+SYRSPEND SF PH4) 50 MG/ML SUSP Take 2.5 mLs by mouth 4 (four) times daily.    Marland Kitchen zolpidem (AMBIEN) 10 MG tablet Take 1 tablet (10 mg total) by mouth at bedtime as needed for sleep. 15 tablet    No current facility-administered medications on file prior to visit.   Active Ambulatory Problems    Diagnosis Date Noted  . Thyroid nodule 08/11/2009  . Osteoarthritis of lumbar spine 10/30/2011  . Family history of premature CAD 05/15/2013  . Essential hypertension 05/15/2013  . Hypothyroidism 05/15/2013  . Hyperlipidemia 05/15/2013  . Palpitations 05/15/2013  . Interstitial cystitis 05/15/2013  . History of migraine headaches 05/15/2013  . Normal coronary arteries 05/14/2014  . GERD  (gastroesophageal reflux disease) 12/16/2014   Resolved Ambulatory Problems    Diagnosis Date Noted  . No Resolved Ambulatory Problems   Past Medical History  Diagnosis Date  . Mood disorder   . Fibromyalgia   . Hypertension   . Migraine    Social History  Substance Use Topics  . Smoking status: Passive Smoke Exposure - Never Smoker  . Smokeless tobacco: Never Used  . Alcohol Use: No  family history includes Allergies in her sister; Alzheimer's disease in her mother; Breast cancer in her maternal aunt; Cardiomyopathy in her brother; Diabetes type II in her brother and another family member; Heart disease in her father; Hyperlipidemia in her brother, brother, father, and mother; Hypertension in her brother, father, mother, and sister; Kidney disease in her brother and brother; Migraines in her sister; Osteoporosis in her mother; Parkinson's disease in her mother.  Review of Systems 10 point ros reviewed, positive pertinents listed in hpi    Objective:   Physical Exam  BP 123/81 mmHg  Pulse 83  Temp(Src) 98.5 F (36.9 C) (Oral)  Wt 215 lb (97.523 kg) Physical Exam  Constitutional:  oriented to person, place, and time. appears well-developed and well-nourished. No distress.  HENT: [place]/AT, PERRLA, no scleral icterus Mouth/Throat: Oropharynx is clear and moist. No oropharyngeal exudate.  Cardiovascular: Normal rate, regular rhythm and normal heart sounds. Exam reveals no gallop and no friction rub.  No murmur heard.  Pulmonary/Chest: Effort normal and breath sounds normal. No respiratory distress.  has no wheezes.  Neck = supple Abdominal: Soft. Bowel sounds are normal.  exhibits no distension. There is no tenderness.  Lymphadenopathy: no cervical adenopathy. No axillary adenopathy Neurological: alert and oriented to person, place, and time.  Skin: Skin is warm and dry. No rash noted. No erythema.  Psychiatric: a normal mood and affect.  behavior is normal.      Assessment &  Plan:  Recurrent c.difficile = she would be a good candidate for fecal transplant to cure her recurrent cdifficile. We will have her start back on 125mg  QID x 14 days with the intent of treating her on with FMTxp once she finishes the 14 day course. We discussed various options and will likely proceed for stool bank from [facility]. Discussed IRB, informed consent for the FMTxp.  Spent 60 min with patient with greater than 50% on counseling of treatment plan.

## 2015-04-09 ENCOUNTER — Telehealth: Payer: Self-pay | Admitting: *Deleted

## 2015-04-09 NOTE — Telephone Encounter (Signed)
Patient called and advised she has made a decision and would like for Dr Baxter Flattery to know. She advised she would like to use [facility] for the stool sample in order to do the Fecal Transplant. Advised to remind Dr Baxter Flattery she will be leaving town 04/25/15-04/29/15. Advised the patient will let the doctor know and to expect a call soon from GI office or Dr Baxter Flattery or both.

## 2015-04-12 DIAGNOSIS — H8110 Benign paroxysmal vertigo, unspecified ear: Secondary | ICD-10-CM

## 2015-04-12 HISTORY — DX: Benign paroxysmal vertigo, unspecified ear: H81.10

## 2015-04-13 ENCOUNTER — Telehealth: Payer: Self-pay | Admitting: *Deleted

## 2015-04-13 NOTE — Telephone Encounter (Signed)
Patient called to find out when her fecal transplant is going to be scheduled. She would like it before she  "leaves the country on 04/25/15".

## 2015-04-13 NOTE — Telephone Encounter (Signed)
Will call the patient and coordinating it with dr. hung

## 2015-04-13 NOTE — Telephone Encounter (Signed)
Ok, thank you

## 2015-04-14 ENCOUNTER — Other Ambulatory Visit: Payer: Self-pay | Admitting: Gastroenterology

## 2015-04-20 ENCOUNTER — Encounter (HOSPITAL_COMMUNITY): Payer: Self-pay | Admitting: *Deleted

## 2015-04-21 ENCOUNTER — Ambulatory Visit (HOSPITAL_COMMUNITY): Payer: No Typology Code available for payment source | Admitting: Certified Registered"

## 2015-04-21 ENCOUNTER — Encounter (HOSPITAL_COMMUNITY)
Admission: RE | Disposition: A | Discharge status: Home / Self Care | Payer: Self-pay | Source: Ambulatory Visit | Attending: Gastroenterology

## 2015-04-21 ENCOUNTER — Encounter (HOSPITAL_COMMUNITY): Payer: Self-pay

## 2015-04-21 ENCOUNTER — Ambulatory Visit (HOSPITAL_COMMUNITY)
Admission: RE | Admit: 2015-04-21 | Discharge: 2015-04-21 | Disposition: A | Discharge status: Home / Self Care | Payer: No Typology Code available for payment source | Source: Ambulatory Visit | Attending: Gastroenterology | Admitting: Gastroenterology

## 2015-04-21 DIAGNOSIS — I10 Essential (primary) hypertension: Secondary | ICD-10-CM | POA: Insufficient documentation

## 2015-04-21 DIAGNOSIS — E039 Hypothyroidism, unspecified: Secondary | ICD-10-CM | POA: Diagnosis not present

## 2015-04-21 DIAGNOSIS — E785 Hyperlipidemia, unspecified: Secondary | ICD-10-CM | POA: Diagnosis not present

## 2015-04-21 DIAGNOSIS — A047 Enterocolitis due to Clostridium difficile: Secondary | ICD-10-CM | POA: Insufficient documentation

## 2015-04-21 HISTORY — DX: Depression, unspecified: F32.A

## 2015-04-21 HISTORY — DX: Cardiac arrhythmia, unspecified: I49.9

## 2015-04-21 HISTORY — PX: FECAL TRANSPLANT: SHX6383

## 2015-04-21 HISTORY — DX: Nausea with vomiting, unspecified: R11.2

## 2015-04-21 HISTORY — DX: Reserved for inherently not codable concepts without codable children: IMO0001

## 2015-04-21 HISTORY — DX: Interstitial cystitis (chronic) without hematuria: N30.10

## 2015-04-21 HISTORY — DX: Unspecified osteoarthritis, unspecified site: M19.90

## 2015-04-21 HISTORY — DX: Other specified postprocedural states: Z98.890

## 2015-04-21 HISTORY — DX: Family history of other specified conditions: Z84.89

## 2015-04-21 HISTORY — DX: Major depressive disorder, single episode, unspecified: F32.9

## 2015-04-21 HISTORY — PX: COLONOSCOPY WITH PROPOFOL: SHX5780

## 2015-04-21 SURGERY — COLONOSCOPY WITH PROPOFOL
Anesthesia: Monitor Anesthesia Care

## 2015-04-21 MED ORDER — MIDAZOLAM HCL 5 MG/5ML IJ SOLN
INTRAMUSCULAR | Status: DC | PRN
  Administered 2015-04-21: 2 mg via INTRAVENOUS

## 2015-04-21 MED ORDER — SODIUM CHLORIDE 0.9 % IV SOLN: INTRAVENOUS | Status: DC

## 2015-04-21 MED ORDER — LACTATED RINGERS IV SOLN
INTRAVENOUS | Status: DC | PRN
  Administered 2015-04-21: 13:00:00 via INTRAVENOUS

## 2015-04-21 MED ORDER — FENTANYL CITRATE (PF) 100 MCG/2ML IJ SOLN
INTRAMUSCULAR | Status: DC | PRN
  Administered 2015-04-21: 50 ug via INTRAVENOUS

## 2015-04-21 MED ORDER — DIPHENOXYLATE-ATROPINE 2.5-0.025 MG PO TABS: 2.0000 | ORAL_TABLET | Freq: Once | ORAL | Status: DC

## 2015-04-21 MED ORDER — PROPOFOL 10 MG/ML IV BOLUS
INTRAVENOUS | Status: DC | PRN
  Administered 2015-04-21 (×4): 10 mg via INTRAVENOUS

## 2015-04-21 MED ORDER — LIDOCAINE HCL (CARDIAC) 20 MG/ML IV SOLN
INTRAVENOUS | Status: DC | PRN
  Administered 2015-04-21: 50 mg via INTRAVENOUS

## 2015-04-21 MED ORDER — PROPOFOL INFUSION 10 MG/ML OPTIME
INTRAVENOUS | Status: DC | PRN
  Administered 2015-04-21: 200 ug/kg/min via INTRAVENOUS

## 2015-04-21 SURGICAL SUPPLY — 1 items: PREP MICROBIOTA FECAL (Tissue) ×10 IMPLANT

## 2015-04-21 NOTE — Discharge Instructions (Signed)
Colonoscopy, Care After °These instructions give you information on caring for yourself after your procedure. Your doctor may also give you more specific instructions. Call your doctor if you have any problems or questions after your procedure. °HOME CARE °· Do not drive for 24 hours. °· Do not sign important papers or use machinery for 24 hours. °· You may shower. °· You may go back to your usual activities, but go slower for the first 24 hours. °· Take rest breaks often during the first 24 hours. °· Walk around or use warm packs on your belly (abdomen) if you have belly cramping or gas. °· Drink enough fluids to keep your pee (urine) clear or pale yellow. °· Resume your normal diet. Avoid heavy or fried foods. °· Avoid drinking alcohol for 24 hours or as told by your doctor. °· Only take medicines as told by your doctor. °If a tissue sample (biopsy) was taken during the procedure:  °· Do not take aspirin or blood thinners for 7 days, or as told by your doctor. °· Do not drink alcohol for 7 days, or as told by your doctor. °· Eat soft foods for the first 24 hours. °GET HELP IF: °You still have a small amount of blood in your poop (stool) 2-3 days after the procedure. °GET HELP RIGHT AWAY IF: °· You have more than a small amount of blood in your poop. °· You see clumps of tissue (blood clots) in your poop. °· Your belly is puffy (swollen). °· You feel sick to your stomach (nauseous) or throw up (vomit). °· You have a fever. °· You have belly pain that gets worse and medicine does not help. °MAKE SURE YOU: °· Understand these instructions. °· Will watch your condition. °· Will get help right away if you are not doing well or get worse. °Document Released: 09/30/2010 Document Revised: 09/02/2013 Document Reviewed: 05/05/2013 °ExitCare® Patient Information ©2015 [employer]. This information is not intended to replace advice given to you by your health care provider. Make sure you discuss any questions you have with  your health care provider. ° °

## 2015-04-21 NOTE — Op Note (Signed)
[facility] [place], [id number]   COLONOSCOPY PROCEDURE REPORT  PATIENT: Molly Giles, Molly Giles  MR#: [id number] BIRTHDATE: 11/23/1955 , 77  yrs. old GENDER: female ENDOSCOPIST: Carol Ada, MD REFERRED BY: PROCEDURE DATE:  05-02-15 PROCEDURE:   Colonoscopy, diagnostic ASA CLASS:   Class II INDICATIONS: Recurrent C. diff MEDICATIONS: Monitored anesthesia care  DESCRIPTION OF PROCEDURE:   After the risks and benefits and of the procedure were explained, informed consent was obtained.  revealed no abnormalities of the rectum.    The Pentax Adult Colon [id number] endoscope was introduced through the anus and advanced to the terminal ileum which was intubated for a short distance .  The quality of the prep was excellent. .  The instrument was then slowly withdrawn as the colon was fully examined. Estimated blood loss is zero unless otherwise noted in this procedure report.     FINDINGS: A normal appearing cecum, ileocecal valve, and appendiceal orifice were identified.  The ascending, transverse, descending, sigmoid colon, and rectum appeared unremarkable.  The TI was deeply intubated and 60 ml of [facility] stool was injected.  Upon withdrawal of the colonoscope the insufflated air was removed to reduce GI motility.     Retroflexed views revealed no abnormalities.     The scope was then withdrawn from the patient and the procedure completed.  WITHDRAWAL TIME: N/A  COMPLICATIONS: There were no immediate complications. ENDOSCOPIC IMPRESSION: 1) Recurrent C. Diff colitis s/p FMT.  RECOMMENDATIONS: 1) Follow up inone month.  REPEAT EXAM:  cc:  _______________________________ eSignedCarol Ada, MD 05-02-2015 1:12 PM   CPT CODES: ICD CODES:  The ICD and CPT codes recommended by this software are interpretations from the data that the clinical staff has captured with the software.  The verification of the translation of  this report to the ICD and CPT codes and modifiers is the sole responsibility of the health care institution and practicing physician where this report was generated.  [employer] will not be held responsible for the validity of the ICD and CPT codes included on this report.  AMA assumes no liability for data contained or not contained herein. CPT is a Designer, television/film set of the [employer].

## 2015-04-21 NOTE — Anesthesia Postprocedure Evaluation (Signed)
  Anesthesia Post-op Note  Patient: Molly Giles  Procedure(s) Performed: Procedure(s): COLONOSCOPY WITH PROPOFOL (N/A) FECAL TRANSPLANT  Patient Location: PACU  Anesthesia Type:MAC  Level of Consciousness: awake, alert  and oriented  Airway and Oxygen Therapy: Patient Spontanous Breathing  Post-op Pain: none  Post-op Assessment: Post-op Vital signs reviewed              Post-op Vital Signs: Reviewed  Last Vitals:  Filed Vitals:   04/21/15 1311  BP: 78/39  Pulse:   Temp:   Resp: 13    Complications: No apparent anesthesia complications

## 2015-04-21 NOTE — Transfer of Care (Signed)
Immediate Anesthesia Transfer of Care Note  Patient: Molly Giles  Procedure(s) Performed: Procedure(s): COLONOSCOPY WITH PROPOFOL (N/A) FECAL TRANSPLANT  Patient Location: Endoscopy Unit  Anesthesia Type:MAC  Level of Consciousness: awake, alert  and sedated  Airway & Oxygen Therapy: Patient connected to nasal cannula oxygen  Post-op Assessment: Report given to RN  Post vital signs: stable  Last Vitals:  Filed Vitals:   04/21/15 1111  BP: 110/72  Pulse: 79  Temp: 36.7 C  Resp: 14    Complications: No apparent anesthesia complications

## 2015-04-21 NOTE — Anesthesia Preprocedure Evaluation (Signed)
Anesthesia Evaluation  Patient identified by MRN, date of birth, ID band Patient awake    Reviewed: Allergy & Precautions, NPO status , Patient's Chart, lab work & pertinent test results, reviewed documented beta blocker date and time   History of Anesthesia Complications (+) PONV  Airway Mallampati: II  TM Distance: >3 FB Neck ROM: Full    Dental   Pulmonary neg pulmonary ROS,  breath sounds clear to auscultation        Cardiovascular hypertension, Pt. on medications and Pt. on home beta blockers Rhythm:Regular Rate:Normal     Neuro/Psych  Headaches, Depression    GI/Hepatic Neg liver ROS, GERD-  ,  Endo/Other  Hypothyroidism Morbid obesity  Renal/GU      Musculoskeletal  (+) Arthritis -, Fibromyalgia -  Abdominal   Peds  Hematology   Anesthesia Other Findings   Reproductive/Obstetrics                             Anesthesia Physical Anesthesia Plan  ASA: II  Anesthesia Plan: MAC   Post-op Pain Management:    Induction: Intravenous  Airway Management Planned: Natural Airway and Simple Face Mask  Additional Equipment:   Intra-op Plan:   Post-operative Plan:   Informed Consent: I have reviewed the patients History and Physical, chart, labs and discussed the procedure including the risks, benefits and alternatives for the proposed anesthesia with the patient or authorized representative who has indicated his/her understanding and acceptance.     Plan Discussed with: CRNA  Anesthesia Plan Comments:         Anesthesia Quick Evaluation

## 2015-04-22 ENCOUNTER — Encounter (HOSPITAL_COMMUNITY): Payer: Self-pay | Admitting: Gastroenterology

## 2015-04-22 NOTE — H&P (Signed)
  Molly Giles HPI: The patient has persistent C. Diff and she has failed three regimens of vancomycin.  She is here for FMT.  Past Medical History  Diagnosis Date  . Mood disorder   . Fibromyalgia   . Hypertension   . Hyperlipidemia   . Migraine   . Hypothyroidism   . PONV (postoperative nausea and vomiting)   . Family history of adverse reaction to anesthesia     Brother- N/V  . Dysrhythmia     seen by dr Wynonia Lawman- not a problem since she has been on Bystolic  . Shortness of breath dyspnea     with exertion  . Depression   . Arthritis   . Interstitial cystitis     Past Surgical History  Procedure Laterality Date  . Carpometacarpal joint arthrotomy Right 2011  . Cardiovascular stress test  2000    Unremarkable per pt report  . Breast biopsy  2011    Benign histology  . Colonoscopy    . Bladder dilitation      x 3  . Colonoscopy with propofol N/A 04/21/2015    Procedure: COLONOSCOPY WITH PROPOFOL;  Surgeon: Carol Ada, MD;  Location: [facility];  Service: Endoscopy;  Laterality: N/A;  . Fecal transplant  04/21/2015    Procedure: FECAL TRANSPLANT;  Surgeon: Carol Ada, MD;  Location[facility] ENDOSCOPY;  Service: Endoscopy;;    Family History  Problem Relation Age of Onset  . Alzheimer's disease Mother   . Hyperlipidemia Mother   . Hypertension Mother   . Osteoporosis Mother   . Parkinson's disease Mother   . Heart disease Father   . Hyperlipidemia Father   . Hypertension Father   . Migraines Sister   . Allergies Sister   . Hypertension Sister   . Hyperlipidemia Brother   . Cardiomyopathy Brother   . Diabetes type II Brother   . Kidney disease Brother   . Hypertension Brother   . Hyperlipidemia Brother   . Kidney disease Brother   . Diabetes type II    . Breast cancer Maternal Aunt     Social History:  reports that she has been passively smoking.  She has never used smokeless tobacco. She reports that she does not drink alcohol or use illicit  drugs.  Allergies:  Allergies  Allergen Reactions  . Codeine Other (See Comments)    Unknown   . Lyrica [Pregabalin] Other (See Comments)    Dizziness   . Sulfa Antibiotics Itching  . Betadine [Povidone Iodine] Rash    burning  . Latex Rash    burning    Medications: Scheduled: Continuous:  No results found for this or any previous visit (from the past 24 hour(s)).   No results found.  ROS:  As stated above in the HPI otherwise negative.  Blood pressure 116/64, pulse 73, temperature 98.1 F (36.7 C), temperature source Oral, resp. rate 17, height 5\' 6"  (1.676 m), weight 97.523 kg (215 lb), SpO2 100 %.    PE: Gen: NAD, Alert and Oriented HEENT:  Molly Giles, EOMI Neck: Supple, no LAD Lungs: CTA Bilaterally CV: RRR without M/G/R ABM: Soft, NTND, +BS Ext: No C/C/E  Assessment/Plan: 1) Refractory C. Diff colitis - FMT today.  Molly Giles D 04/22/2015, 2:09 PM

## 2015-04-30 ENCOUNTER — Ambulatory Visit (INDEPENDENT_AMBULATORY_CARE_PROVIDER_SITE_OTHER): Payer: No Typology Code available for payment source

## 2015-04-30 ENCOUNTER — Ambulatory Visit (INDEPENDENT_AMBULATORY_CARE_PROVIDER_SITE_OTHER): Payer: No Typology Code available for payment source | Admitting: Family Medicine

## 2015-04-30 VITALS — BP 101/74 | HR 70 | Temp 98.3°F | Resp 18

## 2015-04-30 DIAGNOSIS — S0010XA Contusion of unspecified eyelid and periocular area, initial encounter: Secondary | ICD-10-CM | POA: Diagnosis not present

## 2015-04-30 DIAGNOSIS — H5713 Ocular pain, bilateral: Secondary | ICD-10-CM

## 2015-04-30 DIAGNOSIS — M542 Cervicalgia: Secondary | ICD-10-CM | POA: Diagnosis not present

## 2015-04-30 DIAGNOSIS — R51 Headache: Secondary | ICD-10-CM

## 2015-04-30 DIAGNOSIS — W102XXA Fall (on)(from) incline, initial encounter: Secondary | ICD-10-CM

## 2015-04-30 DIAGNOSIS — S50312A Abrasion of left elbow, initial encounter: Secondary | ICD-10-CM | POA: Diagnosis not present

## 2015-04-30 DIAGNOSIS — R519 Headache, unspecified: Secondary | ICD-10-CM

## 2015-04-30 MED ORDER — METHOCARBAMOL 500 MG PO TABS: ORAL_TABLET | ORAL | Status: DC

## 2015-04-30 NOTE — Patient Instructions (Signed)
Tylenol 1000 mg (500 mg 2) 3 times daily and/or ibuprofen 600-800 mg 3 times daily as needed for pain  Ice packs 15 minutes for 4- 5 times daily for a couple of days  Keep forehead clean. Apply a little Polysporin ointment twice daily  Return if problems

## 2015-04-30 NOTE — Progress Notes (Signed)
  Subjective:  Patient ID: Molly Giles, female    DOB: 1956-07-22  Age: 59 y.o. MRN: [id number]  59 year old lady who comes in here with history of having been on vacation in [place]. She was there yesterday morning, the last day, and she was trying to take some pictures. She jumped down along stairs and fell forward, landing on her forehead. She has an abrasion on the forehead and a lot of neck pain since then. She has a headache. She scraped her left elbow and aches in her shoulders and arms. She had no loss of consciousness. She returned to [facility] yesterday. She did not go to the hospital there. EMTs looked better there and let her go.   Objective:   Large abrasion in the middle third of her forehead. TMs normal. Eyes PERRLA. Fundi benign. EOMs intact. No diplopia. Very tender although grams superiorly and the medial portion inferiorly. She has large ecchymoses developing under both eyes. Neck supple but very tender. Left elbow has a small abrasion that is tender but has good range of motion. Upper arms have good range of motion. She is fully alert and oriented. Her husband is with her today.  Assessment & Plan:   Assessment:  Fall Bilateral black eyes Contusion and abrasion of forehead Orbital pain Cervical pain Abrasion left elbow Multiple joint soreness  Plan:  X-ray orbits and neck  [facility] reading (PRIMARY) by  Dr. Linna Darner Normal orbits and normal C-spine.    Patient Instructions  Tylenol 1000 mg (500 mg 2) 3 times daily and/or ibuprofen 600-800 mg 3 times daily as needed for pain  Ice packs 15 minutes for 4- 5 times daily for a couple of days  Keep forehead clean. Apply a little Polysporin ointment twice daily  Return if problems       Iann Rodier, MD 04/30/2015

## 2015-05-18 ENCOUNTER — Telehealth: Payer: Self-pay | Admitting: Internal Medicine

## 2015-05-18 NOTE — Telephone Encounter (Signed)
Patient called Friday afternoon stating that she was afraid that she had C-Diff again. She had a recent fecal transplant in August. Our office was closed Friday afternoon and Monday for the Labor Day holiday, I contacted the patient this morning and her symptoms are better. She has an appointment with her Gastroenterologist tomorrow.

## 2015-05-20 ENCOUNTER — Telehealth: Payer: Self-pay

## 2015-05-20 NOTE — Telephone Encounter (Signed)
Spoke with pt, advised her to RTC. She will come in tomorrow but I advised her to come in tonight if she gets worse or emergency room. Pt agreed.

## 2015-05-20 NOTE — Telephone Encounter (Signed)
Patient called to report worsening symptoms since her last OV for a fall.  She said she has been having jaw problems and can't fully close her jaw.  She reports pain as well.  She would like advise on how to proceed - does she need to come in to be evaluated, take pain meds, etc.?  Please advise.  Thank you.  CB#: [phone]

## 2015-06-02 ENCOUNTER — Ambulatory Visit (INDEPENDENT_AMBULATORY_CARE_PROVIDER_SITE_OTHER): Payer: No Typology Code available for payment source | Admitting: Neurology

## 2015-06-02 VITALS — BP 116/78 | HR 76 | Ht 66.0 in | Wt 217.0 lb

## 2015-06-02 DIAGNOSIS — F0781 Postconcussional syndrome: Secondary | ICD-10-CM

## 2015-06-02 DIAGNOSIS — R42 Dizziness and giddiness: Secondary | ICD-10-CM | POA: Diagnosis not present

## 2015-06-02 MED ORDER — DIAZEPAM 5 MG PO TABS: 5.0000 mg | ORAL_TABLET | Freq: Two times a day (BID) | ORAL | Status: DC | PRN

## 2015-06-02 MED ORDER — NORTRIPTYLINE HCL 10 MG PO CAPS: 10.0000 mg | ORAL_CAPSULE | Freq: Every day | ORAL | Status: DC
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
## 2015-06-02 NOTE — Patient Instructions (Signed)
Overall you are doing fairly well but I do want to suggest a few things today:   Remember to drink plenty of fluid, eat healthy meals and do not skip any meals. Try to eat protein with a every meal and eat a healthy snack such as fruit or nuts in between meals. Try to keep a regular sleep-wake schedule and try to exercise daily, particularly in the form of walking, 20-30 minutes a day, if you can.   As far as your medications are concerned, I would like to suggest: Nortriptyline 10mg  at night for 3-6 months Diazepam 1/2 to 1 tablet every 12 hours as needed for dizziness  I would like to see you back in 3-6 months if symptoms persist, sooner if we need to. Please call us with any interim questions, concerns, problems, updates or refill requests.   Our phone number is [phone]. We also have an after hours call service for urgent matters and there is a physician on-call for urgent questions. For any emergencies you know to call 911 or go to the nearest emergency room

## 2015-06-03 ENCOUNTER — Ambulatory Visit: Payer: No Typology Code available for payment source | Admitting: Family Medicine

## 2015-06-03 ENCOUNTER — Telehealth: Payer: Self-pay | Admitting: *Deleted

## 2015-06-03 NOTE — Telephone Encounter (Signed)
Left message at [facility] center requesting patient mri report.

## 2015-06-06 ENCOUNTER — Encounter: Payer: Self-pay | Admitting: Neurology

## 2015-06-06 DIAGNOSIS — F0781 Postconcussional syndrome: Secondary | ICD-10-CM

## 2015-06-06 HISTORY — DX: Postconcussional syndrome: F07.81

## 2015-06-07 ENCOUNTER — Telehealth: Payer: Self-pay | Admitting: *Deleted

## 2015-06-07 NOTE — Telephone Encounter (Signed)
Advised patient per Dr. Baxter Flattery to see the previous MD she saw for the boil. And that Bactrim and Doxycycline are milder antibiotics than she was given previously. However, even with these she should not be on them for more than 5-7 days. She can call back with any further questions.

## 2015-06-07 NOTE — Telephone Encounter (Signed)
Patient called c/o large painful boil on her genital area. She is fearful of going on antibiotics due to a previous episode of c-diff. She is requesting a call from Dr. Baxter Flattery, as she is uncertain what to do. Please advice

## 2015-06-08 ENCOUNTER — Telehealth: Payer: Self-pay | Admitting: Neurology

## 2015-06-08 ENCOUNTER — Other Ambulatory Visit: Payer: Self-pay | Admitting: Neurology

## 2015-06-08 DIAGNOSIS — R42 Dizziness and giddiness: Secondary | ICD-10-CM

## 2015-06-08 MED ORDER — ONDANSETRON 4 MG PO TBDP: 4.0000 mg | ORAL_TABLET | Freq: Three times a day (TID) | ORAL | Status: DC | PRN

## 2015-06-08 MED ORDER — MECLIZINE HCL 25 MG PO TABS: 25.0000 mg | ORAL_TABLET | Freq: Four times a day (QID) | ORAL | Status: DC | PRN

## 2015-06-08 NOTE — Telephone Encounter (Signed)
Called and spoke to pt. She stated this past Sunday night she threw up and woke up that morning and had a severe migraine. She said the room was spinning and she cannot walk straight still. She is still having vertigo symptoms. She is relaxing at home with her Husband. She is concerned because she is wondering if something else is causing her symptoms. Advised her to go to ER is she has worsening symptoms/new symptoms. Told her Dr. Jaynee Eagles is finishing with her last pt and I will let her know what is going on and we will call her back.  She verbalized understanding.

## 2015-06-08 NOTE — Telephone Encounter (Signed)
Patient is calling and states that she is not feeling any better as she still has the vertigo and wonders if she could have an inner ear infection.  Please call.

## 2015-06-08 NOTE — Telephone Encounter (Signed)
Spoke to patient. Will refill meclizine. Will order zofran. Will refer to ENT and vestibular therapy

## 2015-06-17 ENCOUNTER — Ambulatory Visit
Payer: No Typology Code available for payment source | Attending: Otolaryngology | Admitting: Rehabilitative and Restorative Service Providers"

## 2015-06-17 DIAGNOSIS — R269 Unspecified abnormalities of gait and mobility: Secondary | ICD-10-CM | POA: Insufficient documentation

## 2015-06-17 DIAGNOSIS — H8112 Benign paroxysmal vertigo, left ear: Secondary | ICD-10-CM | POA: Insufficient documentation

## 2015-06-17 NOTE — Therapy (Signed)
[facility] [address] [place], [place], [address] Phone: [phone]   Fax:  [phone]  Physical Therapy Evaluation  Patient Details  Name: Molly Giles MRN: [id number] Date of Birth: Jul 28, 1956 Referring Provider:  Thornell Sartorius, MD  Encounter Date: 06/17/2015      PT End of Session - 06/17/15 1409    Visit Number 1   Number of Visits 12   Date for PT Re-Evaluation 08/01/15   Authorization Type private insurance   PT Start Time 6333   PT Stop Time 1405   PT Time Calculation (min) 47 min   Activity Tolerance Patient tolerated treatment well   Behavior During Therapy [facility] for tasks assessed/performed      Past Medical History  Diagnosis Date  . Mood disorder   . Fibromyalgia   . Hypertension   . Hyperlipidemia   . Migraine   . Hypothyroidism   . PONV (postoperative nausea and vomiting)   . Family history of adverse reaction to anesthesia     Brother- N/V  . Dysrhythmia     seen by dr Wynonia Lawman- not a problem since she has been on Bystolic  . Shortness of breath dyspnea     with exertion  . Depression   . Arthritis   . Interstitial cystitis     Past Surgical History  Procedure Laterality Date  . Carpometacarpal joint arthrotomy Right 2011  . Cardiovascular stress test  2000    Unremarkable per pt report  . Breast biopsy  2011    Benign histology  . Colonoscopy    . Bladder dilitation      x 3  . Colonoscopy with propofol N/A 04/21/2015    Procedure: COLONOSCOPY WITH PROPOFOL;  Surgeon: Carol Ada, MD;  Location: [facility];  Service: Endoscopy;  Laterality: N/A;  . 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  Visit Diagnosis:  BPPV (benign paroxysmal positional vertigo), left  Abnormality of gait      Subjective Assessment - 06/17/15 1323     Subjective The patient is s/p a fall in April 29, 2015 hitting her head with no loss of consciousness (hit forehead).  Since that time, she has had increased migraines, general post concussive headaches.  Dizziness began on 05/24/2015 when awaking with spinning lasting all day with some form of dizziness experienced each day.  In general, she describes 2 different symtpoms (1-spinning with certain movements, 2-swimmyheaded brought on during gait with veering during gait).     Patient Stated Goals reduce dizziness, improve concentration   Currently in Pain? Yes   Pain Score 0-No pain  took med for migraine @11  and has improved pain   Pain Location Neck   Pain Type Acute pain   Pain Onset More than a month ago   Pain Frequency Intermittent   Pain Relieving Factors meds            [facility] PT Assessment - 06/17/15 1327    Assessment   Medical Diagnosis post concussion, vertigo   Onset Date/Surgical Date 04/29/15   Prior Therapy none   Balance Screen   Has the patient fallen in the past 6 months Yes   How many times? 1   Has the patient had a decrease in activity level because of a fear of falling?  No   Is  the patient reluctant to leave their home because of a fear of falling?  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   Cognition   Attention --  decreased concentration   Observation/Other Assessments   Focus on Therapeutic Outcomes (FOTO)  53%   Other Surveys  --  DHI=70%            Vestibular Assessment - 06/17/15 1332    Symptom Behavior   Type of Dizziness Spinning  and lightheaded   Frequency of Dizziness daily   Duration of Dizziness seconds    Aggravating Factors Activity in general  rolling left   Relieving Factors Rest   Occulomotor Exam   Occulomotor Alignment Normal   Spontaneous Absent   Gaze-induced Absent    Smooth Pursuits Intact   Saccades Intact   Vestibulo-Occular Reflex   VOR 1 Head Only (x 1 viewing) --  slow gaze x 3 reps with "swimmyheaded" sensation   Positional Testing   Dix-Hallpike Dix-Hallpike Right;Dix-Hallpike Left   Horizontal Canal Testing Horizontal Canal Right;Horizontal Canal Left   Dix-Hallpike Right   Dix-Hallpike Right Symptoms No nystagmus   Dix-Hallpike Left   Dix-Hallpike Left Symptoms No nystagmus   Horizontal Canal Right   Horizontal Canal Right Duration mild c/o dizziness settles within 20 seconds   Horizontal Canal Right Symptoms Normal   Horizontal Canal Left   Horizontal Canal Left Symptoms --  nystagmus not viewed ?meds, subjective dizziness x sec                Vestibular Treatment/Exercise - 06/17/15 1352    Vestibular Treatment/Exercise   Vestibular Treatment Provided Canalith Repositioning;Habituation;Gaze   Canalith Repositioning Canal Roll Left   Habituation Exercises Horizontal Roll   Gaze Exercises --   Canal Roll Left   Number of Reps  1   Response Details  patient felt increased swimmyheaded sensation   Horizontal Roll   Number of Reps  2   Symptom Description  spinning then swimmyheaded               PT Education - 06/17/15 1400    Education provided Yes   Education Details HEP: habituation rolling R<>L x 3-5 reps   Person(s) Educated Patient   Methods Explanation;Demonstration;Handout   Comprehension Verbalized understanding;Returned demonstration          PT Short Term Goals - 06/17/15 1414    PT SHORT TERM GOAL #1   Title The patient will be independent with HEP for habituation, gaze x 1 adaptation, and general mobility.   Baseline Target date 07/17/2015   Time 4   Period Weeks   PT SHORT TERM GOAL #2   Title The patient will perform supine<>bilateral rolling with no subjective c/o spinning sensation.   Baseline Target date 07/17/2015   Time 4   Period Weeks   PT SHORT TERM GOAL #3   Title The  patient will tolerate gaze x 1 adaptation x 20 seconds at self selected speed for improved movement tolerance.   Baseline Target date 07/17/2015   Time 4   Period Weeks   PT SHORT TERM GOAL #4   Title The patient will be further assessed on DGI and SOT and goal to follow, if indicated.   Baseline Target date 07/17/2015   Time 4   Period Weeks  PT Long Term Goals - 06/17/15 1416    PT LONG TERM GOAL #1   Title The patient will be indep with HEP for progression after d/c from PT.   Baseline Target date 08/01/2015   Time 6   Period Weeks   PT LONG TERM GOAL #2   Title The patient will imrpove dizziness handicap index from 70% to < or equal to 50%.   Baseline Target date 08/01/2015   Time 6   Period Weeks   PT LONG TERM GOAL #3   Title The patient will perform looking up/down and side/side without c/o dizziness or swimmyheadedness.   Baseline Target date 08/01/2015   Time 6   Period Weeks               Plan - 06/17/15 1418    Clinical Impression Statement The patient is a 59 yo female with h/o migraines s/p concussion in 04/2015 and then onset of room spinning vertigo in 05/2015.  At today's session, nystagmus were difficult to view in room light (possibly from taking meds for migraine 2 hours ago), however she had spinning sensation reported with L horizontal roll test.  PT treated based on symptomology for L horizontal canalithiasis with canolith repositioning maneuver. Patient felt improvement in symptoms of spinning with noting "swimmyheadedness" remaining.  PT added habituation for HEP and will reassess next visit.   Pt will benefit from skilled therapeutic intervention in order to improve on the following deficits Abnormal gait;Decreased activity tolerance;Decreased balance;Decreased mobility;Dizziness;Pain   Rehab Potential Good   PT Frequency 2x / week   PT Duration 6 weeks   PT Treatment/Interventions Therapeutic exercise;Therapeutic activities;Functional  mobility training;Balance training;Neuromuscular re-education;Gait training;Patient/family education;Vestibular;Canalith Repostioning   PT Next Visit Plan Recheck BPPV, treat post concussive symptoms adding gaze x 1 adaptation, habituation, and general home walking program.  Further assess DGI and SOT.   Consulted and Agree with Plan of Care Patient         Problem List Patient Active Problem List   Diagnosis Date Noted  . Post concussion syndrome 06/06/2015  . GERD (gastroesophageal reflux disease) 12/16/2014  . Normal coronary arteries 05/14/2014  . Family history of premature CAD 05/15/2013  . Essential hypertension 05/15/2013  . Hypothyroidism 05/15/2013  . Hyperlipidemia 05/15/2013  . Palpitations 05/15/2013  . Interstitial cystitis 05/15/2013  . History of migraine headaches 05/15/2013  . Osteoarthritis of lumbar spine 10/30/2011  . Thyroid nodule 08/11/2009   Thank you for the referral of this patient. Rudell Cobb, MPT  [place], PT 06/17/2015, 2:24 PM  [facility] [address] [place], [place], [address] Phone: [phone]   Fax:  [phone]

## 2015-06-17 NOTE — Patient Instructions (Signed)
Special Instructions: Exercises may bring on mild to moderate symptoms of dizziness, or headache that resolve within 30 minutes of completing exercises. If symptoms are lasting longer than 30 minutes, modify your exercises by:  >decreasing the # of times you complete each activity >ensuring your symptoms return to baseline before moving onto the next exercise >dividing up exercises so you do not do them all in one session, but multiple short sessions throughout the day >doing them once a day until symptoms improve  Tip Card 1.The goal of habituation training is to assist in decreasing symptoms of vertigo, dizziness, or nausea provoked by specific head and body motions. 2.These exercises may initially increase symptoms; however, be persistent and work through symptoms. With repetition and time, the exercises will assist in reducing or eliminating symptoms.  Copyright  VHI. All rights reserved.  Rolling   With pillow under head, start on back. Roll to your right side.  Hold until dizziness stops, plus 20 seconds and then roll to the left side.  Hold until dizziness stops, plus 20 seconds.  Repeat sequence 3 to 5 times per session. Do 1 session a day and increase to 2 sessions if tolerating exercises well. Copyright  VHI. All rights reserved.

## 2015-06-22 ENCOUNTER — Ambulatory Visit: Payer: No Typology Code available for payment source | Admitting: Audiology

## 2015-06-24 ENCOUNTER — Ambulatory Visit: Payer: PRIVATE HEALTH INSURANCE | Admitting: Cardiovascular Disease

## 2015-06-24 ENCOUNTER — Telehealth: Payer: Self-pay | Admitting: Cardiovascular Disease

## 2015-06-24 NOTE — Telephone Encounter (Signed)
Please put a lab order in the mail to her before her appt on 07/26/15 at 11:30am w/ Dr. Claiborne Billings   Thanks

## 2015-06-24 NOTE — Telephone Encounter (Signed)
Order mailed to patient.

## 2015-06-25 ENCOUNTER — Ambulatory Visit: Payer: No Typology Code available for payment source | Admitting: Rehabilitative and Restorative Service Providers"

## 2015-06-25 DIAGNOSIS — H8112 Benign paroxysmal vertigo, left ear: Secondary | ICD-10-CM

## 2015-06-25 DIAGNOSIS — R269 Unspecified abnormalities of gait and mobility: Secondary | ICD-10-CM

## 2015-06-25 NOTE — Therapy (Signed)
Molly Giles [address] [place], [place], [address] Phone: [phone]   Fax:  [phone]  Physical Therapy Treatment  Patient Details  Name: Molly Giles MRN: [id number] Date of Birth: [birth year] No Data Recorded  Encounter Date: 06/25/2015      PT End of Session - 06/25/15 1425    Visit Number 2   Number of Visits 12   Date for PT Re-Evaluation 08/01/15   Authorization Type private insurance   PT Start Time 1325   PT Stop Time 1405   PT Time Calculation (min) 40 min   Activity Tolerance Patient tolerated treatment well   Behavior During Therapy [facility] for tasks assessed/performed      Past Medical History  Diagnosis Date  . Mood disorder   . Fibromyalgia   . Hypertension   . Hyperlipidemia   . Migraine   . Hypothyroidism   . PONV (postoperative nausea and vomiting)   . Family history of adverse reaction to anesthesia     Brother- N/V  . Dysrhythmia     seen by dr Wynonia Lawman- not a problem since she has been on Bystolic  . Shortness of breath dyspnea     with exertion  . Depression   . Arthritis   . Interstitial cystitis     Past Surgical History  Procedure Laterality Date  . Carpometacarpal joint arthrotomy Right 2011  . Cardiovascular stress test  2000    Unremarkable per pt report  . Breast biopsy  2011    Benign histology  . Colonoscopy    . Bladder dilitation      x 3  . Colonoscopy with propofol N/A 04/21/2015    Procedure: COLONOSCOPY WITH PROPOFOL;  Surgeon: Carol Ada, MD;  Location: [place];  Service: Endoscopy;  Laterality: N/A;  . 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  Visit Diagnosis:  Abnormality of gait  BPPV (benign paroxysmal positional vertigo), left      Subjective Assessment - 06/25/15 1324    Subjective The patient feels  that symptoms are 50% better.  She notes some veering with ambulation when getting up.  She reports she does get some dizziness mid day.  She is now taking meclizine 1x/day.            Vestibular Assessment - 06/25/15 1329    Positional Testing   Dix-Hallpike Dix-Hallpike Right;Dix-Hallpike Left   Sidelying Test Sidelying Right;Sidelying Left   Horizontal Canal Testing Horizontal Canal Left;Horizontal Canal Right   Dix-Hallpike Right   Dix-Hallpike Right Duration seconds   Dix-Hallpike Right Symptoms Upbeat, right rotatory nystagmus   Sidelying Right   Sidelying Right Symptoms No nystagmus   Sidelying Left   Sidelying Left Symptoms No nystagmus   Horizontal Canal Right   Horizontal Canal Right Duration mild sensation   Horizontal Canal Right Symptoms --  noted upbeat, rotary when looking to the right   Horizontal Canal Left   Horizontal Canal Left Duration swimmy headed, less sensation of spinning   Horizontal Canal Left Symptoms Ageotrophic  mild          Vestibular Treatment/Exercise - 06/25/15 1353    Vestibular Treatment/Exercise   Vestibular Treatment Provided Canalith Repositioning;Habituation;Gaze   Canalith Repositioning Epley Manuever Right   Habituation Exercises [facility] Daroff;Horizontal Roll   Gaze Exercises X1 Viewing Horizontal  EPLEY MANUEVER RIGHT   Number of Reps  1   Overall Response --  mild nystagmus noted during repositioning   Response Details  "my head hurts worse" at end of session   Nestor Lewandowsky   Number of Reps  3   Symptom Description  improved with repetition   Horizontal Roll   Number of Reps  3   Symptom Description  mild sensation of dizziness that improves with repetition   X1 Viewing Horizontal   Foot Position standing with feet apart   Time --  10   Reps 2   Comments needs cues for larger head movements      NEUROMUSCULAR RE-EDUCATION: Sensory Organization Testing=67% compared to age/height normative value of 70%.    Patient demonstrated WNLs use of somatosensory feedback for balance, mildly decreased use of visual feedback for balance, and mildly decreased use of vestibular feedback for balance.        PT Education - 06/25/15 1413    Education provided Yes   Education Details HEP: habituation brandt daroff, rolling, gaze;  educated on sensory organization testing results   Person(s) Educated Patient   Methods Explanation;Demonstration;Handout   Comprehension Returned demonstration;Verbalized understanding          PT Short Term Goals - 06/25/15 1426    PT SHORT TERM GOAL #1   Title The patient will be independent with HEP for habituation, gaze x 1 adaptation, and general mobility.   Baseline Met on 06/25/2015   Status Achieved   PT SHORT TERM GOAL #2   Title The patient will perform supine<>bilateral rolling with no subjective c/o spinning sensation.   Baseline Target date 07/17/2015   Time 4   Period Weeks   Status On-going   PT SHORT TERM GOAL #3   Title The patient will tolerate gaze x 1 adaptation x 20 seconds at self selected speed for improved movement tolerance.   Baseline Target date 07/17/2015   Time 4   Status On-going   PT SHORT TERM GOAL #4   Title The patient will be further assessed on DGI and SOT and goal to follow, if indicated.   Baseline Target date 07/17/2015   Time 4   Status On-going           PT Long Term Goals - 06/17/15 1416    PT LONG TERM GOAL #1   Title The patient will be indep with HEP for progression after d/c from PT.   Baseline Target date 08/01/2015   Time 6   Period Weeks   PT LONG TERM GOAL #2   Title The patient will imrpove dizziness handicap index from 70% to < or equal to 50%.   Baseline Target date 08/01/2015   Time 6   Period Weeks   PT LONG TERM GOAL #3   Title The patient will perform looking up/down and side/side without c/o dizziness or swimmyheadedness.   Baseline Target date 08/01/2015   Time 6   Period Weeks                Plan - 06/25/15 1427    Clinical Impression Statement The patient has mild decrease in sensory organization testing with PT to focus on compliant surface training in order to improve reliance on visual and vestibular systems for balance.   PT Next Visit Plan Assess BPPV, check HEP, DGI, treat balance based on SOT (focus on compliant surfaces with eyes open + head turns, eyes closed), turns, and high level balance.  Consulted and Agree with Plan of Care Patient        Problem List Patient Active Problem List   Diagnosis Date Noted  . Post concussion syndrome 06/06/2015  . GERD (gastroesophageal reflux disease) 12/16/2014  . Normal coronary arteries 05/14/2014  . Family history of premature CAD 05/15/2013  . Essential hypertension 05/15/2013  . Hypothyroidism 05/15/2013  . Hyperlipidemia 05/15/2013  . Palpitations 05/15/2013  . Interstitial cystitis 05/15/2013  . History of migraine headaches 05/15/2013  . Osteoarthritis of lumbar spine 10/30/2011  . Thyroid nodule 08/11/2009    Ontario Pettengill, PT 06/25/2015, 2:28 PM  [facility] [address] [place], [place], [address] Phone: [phone]   Fax:  [phone]  Name: PORCIA MORGANTI MRN: [id number] Date of Birth: June 10, 1956

## 2015-06-25 NOTE — Patient Instructions (Signed)
Tip Card 1.The goal of habituation training is to assist in decreasing symptoms of vertigo, dizziness, or nausea provoked by specific head and body motions. 2.These exercises may initially increase symptoms; however, be persistent and work through symptoms. With repetition and time, the exercises will assist in reducing or eliminating symptoms. 3.Exercises should be stopped and discussed with the therapist if you experience any of the following: - Sudden change or fluctuation in hearing - New onset of ringing in the ears, or increase in current intensity - Any fluid discharge from the ear - Severe pain in neck or back - Extreme nausea  Copyright  VHI. All rights reserved.  Rolling   With pillow under head, start on back. Roll to your right side.  Hold until dizziness stops, plus 20 seconds and then roll to the left side.  Hold until dizziness stops, plus 20 seconds.  Repeat sequence 5 times per session. Do 2 sessions per day.  Copyright  VHI. All rights reserved.  Sit to Side-Lying   Sit on edge of bed. Lie down onto the right side and hold until dizziness stops, plus 20 seconds.  Return to sitting and wait until dizziness stops, plus 20 seconds.  Repeat to the left side. Repeat sequence 5 times per session. Do 2 sessions per day.  Copyright  VHI. All rights reserved.  Gaze Stabilization: Tip Card 1.Target must remain in focus, not blurry, and appear stationary while head is in motion. 2.Perform exercises with small head movements (45 to either side of midline). 3.Increase speed of head motion so long as target is in focus. 4.If you wear eyeglasses, be sure you can see target through lens (therapist will give specific instructions for bifocal / progressive lenses). 5.These exercises may provoke dizziness or nausea. Work through these symptoms. If too dizzy, slow head movement slightly. Rest between each exercise. 6.Exercises demand concentration; avoid distractions. 7.For safety,  perform standing exercises close to a counter, wall, corner, or next to someone.  Copyright  VHI. All rights reserved.  Gaze Stabilization: Standing Feet Apart   Feet shoulder width apart, keeping eyes on target on wall 3 feet away, tilt head down slightly and move head side to side for 30 seconds. Repeat while moving head up and down for 30 seconds. (may start with 10-15 seconds and work up to 20 seconds) Do 2-3 sessions per day.   Copyright  VHI. All rights reserved.   Special Instructions: Exercises may bring on mild to moderate (4-5/10 at most) symptoms of dizziness, swimmyheadedness that resolve within 30 minutes of completing exercises. If symptoms are lasting longer than 30 minutes, modify your exercises by:  >decreasing the # of times you complete each activity >ensuring your symptoms return to baseline before moving onto the next exercise >dividing up exercises so you do not do them all in one session, but multiple short sessions throughout the day >doing them once a day until symptoms improve

## 2015-06-28 ENCOUNTER — Ambulatory Visit: Payer: No Typology Code available for payment source | Admitting: Rehabilitative and Restorative Service Providers"

## 2015-06-28 ENCOUNTER — Other Ambulatory Visit: Payer: Self-pay

## 2015-06-28 DIAGNOSIS — R269 Unspecified abnormalities of gait and mobility: Secondary | ICD-10-CM

## 2015-06-28 DIAGNOSIS — H8112 Benign paroxysmal vertigo, left ear: Secondary | ICD-10-CM | POA: Diagnosis not present

## 2015-06-28 DIAGNOSIS — Z1231 Encounter for screening mammogram for malignant neoplasm of breast: Secondary | ICD-10-CM

## 2015-06-28 NOTE — Therapy (Signed)
[facility] [address] [place], [place], [address] Phone: [phone]   Fax:  [phone]  Physical Therapy Treatment  Patient Details  Name: Molly Giles MRN: [id number] Date of Birth: 1956/04/25 No Data Recorded  Encounter Date: 06/28/2015      PT End of Session - 06/28/15 0846    Visit Number 3   Number of Visits 12   Date for PT Re-Evaluation 08/01/15   Authorization Type private insurance   PT Start Time 0815   PT Stop Time 0845   PT Time Calculation (min) 30 min   Activity Tolerance Patient tolerated treatment well   Behavior During Therapy [facility] for tasks assessed/performed      Past Medical History  Diagnosis Date  . Mood disorder   . Fibromyalgia   . Hypertension   . Hyperlipidemia   . Migraine   . Hypothyroidism   . PONV (postoperative nausea and vomiting)   . Family history of adverse reaction to anesthesia     Brother- N/V  . Dysrhythmia     seen by dr Wynonia Lawman- not a problem since she has been on Bystolic  . Shortness of breath dyspnea     with exertion  . Depression   . Arthritis   . Interstitial cystitis     Past Surgical History  Procedure Laterality Date  . Carpometacarpal joint arthrotomy Right 2011  . Cardiovascular stress test  2000    Unremarkable per pt report  . Breast biopsy  2011    Benign histology  . Colonoscopy    . Bladder dilitation      x 3  . Colonoscopy with propofol N/A 04/21/2015    Procedure: COLONOSCOPY WITH PROPOFOL;  Surgeon: Carol Ada, MD;  Location: [place];  Service: Endoscopy;  Laterality: N/A;  . 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  Visit Diagnosis:  Abnormality of gait  BPPV (benign paroxysmal positional vertigo), left      Subjective Assessment - 06/28/15 0818    Subjective The patient  reports that she was unable to do anything on Friday after PT ended.  She was able to tolerate doing the exercises last night with only one episode of vertigo when rolling to the left that lasted x seconds.   She is without meclizine today.  Balance was off this morning with rising.   Currently in Pain? No/denies  no headache          Vestibular Assessment - 06/28/15 0831    Positional Testing   Sidelying Test Sidelying Right;Sidelying Left          [facility] Adult PT Treatment/Exercise - 06/28/15 0841    Ambulation/Gait   Ambulation/Gait Yes   Ambulation Surface Level   Gait Comments Ambulation with horizontal and vertical head turns without loss of balance or increased dizziness   Neuro Re-ed    Neuro Re-ed Details  corner balance HEP standing on foam with eyes open and eyes closed         Vestibular Treatment/Exercise - 06/28/15 6629    Vestibular Treatment/Exercise   Vestibular Treatment Provided Habituation;Gaze   Habituation Exercises Laruth Bouchard Daroff;Horizontal Roll   Nestor Lewandowsky   Number of Reps  2   Symptom Description  reviewed prior HEP   Horizontal Roll   Number of Reps  2   Symptom  Description  Reviewed prior HEP   X1 Viewing Horizontal   Foot Position standing   Time --  30 seconds   Reps 3   Comments performed horizontal and vertical with increased dizziness with each direction               PT Education - 06/28/15 1053    Education provided Yes   Education Details HEP: corner balance exercise foam with eyes closed, continue other HEP   Person(s) Educated Patient   Methods Explanation;Demonstration;Handout   Comprehension Verbalized understanding;Returned demonstration          PT Short Term Goals - 06/25/15 1426    PT SHORT TERM GOAL #1   Title The patient will be independent with HEP for habituation, gaze x 1 adaptation, and general mobility.   Baseline Met on 06/25/2015   Status Achieved   PT SHORT TERM GOAL #2   Title The patient will  perform supine<>bilateral rolling with no subjective c/o spinning sensation.   Baseline Target date 07/17/2015   Time 4   Period Weeks   Status On-going   PT SHORT TERM GOAL #3   Title The patient will tolerate gaze x 1 adaptation x 20 seconds at self selected speed for improved movement tolerance.   Baseline Target date 07/17/2015   Time 4   Status On-going   PT SHORT TERM GOAL #4   Title The patient will be further assessed on DGI and SOT and goal to follow, if indicated.   Baseline Target date 07/17/2015   Time 4   Status On-going           PT Long Term Goals - 06/17/15 1416    PT LONG TERM GOAL #1   Title The patient will be indep with HEP for progression after d/c from PT.   Baseline Target date 08/01/2015   Time 6   Period Weeks   PT LONG TERM GOAL #2   Title The patient will imrpove dizziness handicap index from 70% to < or equal to 50%.   Baseline Target date 08/01/2015   Time 6   Period Weeks   PT LONG TERM GOAL #3   Title The patient will perform looking up/down and side/side without c/o dizziness or swimmyheadedness.   Baseline Target date 08/01/2015   Time 6   Period Weeks               Plan - 06/28/15 7262    Clinical Impression Statement The patient continues with trace vertigo with rolling with minimal nystagmus viewed in room light.  PT to continue working towards [place].   PT Next Visit Plan Recheck BPPV, high level balance, gaze progression, dynamic gait.   Consulted and Agree with Plan of Care Patient        Problem List Patient Active Problem List   Diagnosis Date Noted  . Post concussion syndrome 06/06/2015  . GERD (gastroesophageal reflux disease) 12/16/2014  . Normal coronary arteries 05/14/2014  . Family history of premature CAD 05/15/2013  . Essential hypertension 05/15/2013  . Hypothyroidism 05/15/2013  . Hyperlipidemia 05/15/2013  . Palpitations 05/15/2013  . Interstitial cystitis 05/15/2013  . History of migraine  headaches 05/15/2013  . Osteoarthritis of lumbar spine 10/30/2011  . Thyroid nodule 08/11/2009    Jowel Waltner, PT 06/28/2015, 10:15 PM  [facility] [address] [place], [place], [address] Phone: [phone]   Fax:  [phone]  Name: Molly Giles MRN: [id number] Date  of Birth: May 22, 1956

## 2015-06-28 NOTE — Patient Instructions (Signed)
Feet Together (Compliant Surface) Varied Arm Positions - Eyes Closed    Stand on compliant surface: __pillow______ with feet together and arms at your side. Close eyes and visualize upright position. Hold__30__ seconds. Repeat _3___ times per session. Do _1-2___ sessions per day. *if you can't tolerate 30 seconds, begin with 10 seconds and work up to 30 seconds.  Copyright  VHI. All rights reserved.

## 2015-06-30 ENCOUNTER — Ambulatory Visit: Payer: No Typology Code available for payment source | Admitting: Rehabilitative and Restorative Service Providers"

## 2015-06-30 DIAGNOSIS — H8112 Benign paroxysmal vertigo, left ear: Secondary | ICD-10-CM | POA: Diagnosis not present

## 2015-06-30 DIAGNOSIS — R269 Unspecified abnormalities of gait and mobility: Secondary | ICD-10-CM

## 2015-06-30 NOTE — Patient Instructions (Signed)
CONTINUE SAME EXERCISE WITH FEET APART AND ALSO ADD THIS EXERCISE:  Gaze Stabilization: Standing Feet Partial Heel-Toe    Feet in partial heel-toe position, keeping eyes fixed on target on wall __3__ feet away, tilt head down slightly and move head side to side for _30___ seconds. Repeat while moving head up and down for _30___ seconds. Do _2___ sessions per day.   Copyright  VHI. All rights reserved.   Thoracic Self-Mobilization (Supine)    With rolled towel placed lengthwise at lower ribs level, lie back on towel with arms outstretched. Hold __2 minutes. Relax. Repeat _1___ times per set.  Do __1-2__ sessions per day or as needed.  http://orth.exer.us/1001   Copyright  VHI. All rights reserved.    Look towards your left shoulder and gently walk the right hand out to the side.  Hold for 20 seconds.  Repeat the other direction 3 times or as needed.  Flexion: Stretch - Neck Extensors (Supine)    *use a towel roll to lengthen the back of your neck by placing it under the base of your skull. Rest on towel for 5 minutes  Copyright  VHI. All rights reserved.

## 2015-06-30 NOTE — Therapy (Signed)
[facility] [address] [place], [place], [address] Phone: [phone]   Fax:  [phone]  Physical Therapy Treatment  Patient Details  Name: Molly Giles MRN: [id number] Date of Birth: Jul 04, 1956 No Data Recorded  Encounter Date: 06/30/2015      PT End of Session - 06/30/15 1406    Visit Number 4   Number of Visits 12   Date for PT Re-Evaluation 08/01/15   Authorization Type private insurance   PT Start Time 1109   PT Stop Time 1149   PT Time Calculation (min) 40 min   Activity Tolerance Patient tolerated treatment well   Behavior During Therapy [facility] for tasks assessed/performed      Past Medical History  Diagnosis Date  . Mood disorder   . Fibromyalgia   . Hypertension   . Hyperlipidemia   . Migraine   . Hypothyroidism   . PONV (postoperative nausea and vomiting)   . Family history of adverse reaction to anesthesia     Brother- N/V  . Dysrhythmia     seen by dr Wynonia Lawman- not a problem since she has been on Bystolic  . Shortness of breath dyspnea     with exertion  . Depression   . Arthritis   . Interstitial cystitis     Past Surgical History  Procedure Laterality Date  . Carpometacarpal joint arthrotomy Right 2011  . Cardiovascular stress test  2000    Unremarkable per pt report  . Breast biopsy  2011    Benign histology  . Colonoscopy    . Bladder dilitation      x 3  . Colonoscopy with propofol N/A 04/21/2015    Procedure: COLONOSCOPY WITH PROPOFOL;  Surgeon: Carol Ada, MD;  Location: [place];  Service: Endoscopy;  Laterality: N/A;  . 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  Visit Diagnosis:  Abnormality of gait  BPPV (benign paroxysmal positional vertigo), left      Subjective Assessment - 06/30/15 1112    Subjective The patient has a  migraine today (woke with) so not feeling well.  The patient reports she is feeling significantly better overall and was able to negotiate community surfaces last night without imbalance or dizziness.  NO vertigo with rolling in bed today.     Patient Stated Goals reduce dizziness, improve concentration   Currently in Pain? Yes   Pain Score 7    Pain Location Head   Pain Descriptors / Indicators Aching   Pain Type Acute pain   Pain Onset Today   Pain Frequency Constant   Aggravating Factors  migraines occur regularly- patient manages wth meds                Vestibular Assessment - 06/30/15 1122    Sidelying Right   Sidelying Right Symptoms No nystagmus   Sidelying Left   Sidelying Left Symptoms No nystagmus   Horizontal Canal Right   Horizontal Canal Right Duration "a little bit of movement, but not uncomfortable"   Horizontal Canal Right Symptoms Normal   Horizontal Canal Left   Horizontal Canal Left Symptoms Normal           [facility] Adult PT Treatment/Exercise - 06/30/15 1408    Exercises   Exercises Other Exercises   Other Exercises  Neck exercises supine with towel roll stretch,  upper back/ant chest stretch with towel roll, seated levator and scalene stretch, shoulder rolls.   Manual Therapy   Manual Therapy Soft tissue mobilization;Manual Traction   Manual therapy comments L scalenes more tight than R with soreness with palpation.     Soft tissue mobilization Soft tissue mobilization in supine with overpressure and stretching   Manual Traction supine with passive overpressure         Vestibular Treatment/Exercise - 06/30/15 1117    X1 Viewing Horizontal   Foot Position standing with partial heel/toe   Comments nausea with movement today and increased sway           PT Education - 06/30/15 1406    Education provided Yes   Education Details HEP: progressed gaze x 1 with partial heel/toe, towel roll stretch, neck stretch diagonal, and supine towel roll  suboccipital release   Person(s) Educated Patient   Methods Explanation;Demonstration;Handout   Comprehension Verbalized understanding;Returned demonstration          PT Short Term Goals - 06/25/15 1426    PT SHORT TERM GOAL #1   Title The patient will be independent with HEP for habituation, gaze x 1 adaptation, and general mobility.   Baseline Met on 06/25/2015   Status Achieved   PT SHORT TERM GOAL #2   Title The patient will perform supine<>bilateral rolling with no subjective c/o spinning sensation.   Baseline Target date 07/17/2015   Time 4   Period Weeks   Status On-going   PT SHORT TERM GOAL #3   Title The patient will tolerate gaze x 1 adaptation x 20 seconds at self selected speed for improved movement tolerance.   Baseline Target date 07/17/2015   Time 4   Status On-going   PT SHORT TERM GOAL #4   Title The patient will be further assessed on DGI and SOT and goal to follow, if indicated.   Baseline Target date 07/17/2015   Time 4   Status On-going           PT Long Term Goals - 06/17/15 1416    PT LONG TERM GOAL #1   Title The patient will be indep with HEP for progression after d/c from PT.   Baseline Target date 08/01/2015   Time 6   Period Weeks   PT LONG TERM GOAL #2   Title The patient will imrpove dizziness handicap index from 70% to < or equal to 50%.   Baseline Target date 08/01/2015   Time 6   Period Weeks   PT LONG TERM GOAL #3   Title The patient will perform looking up/down and side/side without c/o dizziness or swimmyheadedness.   Baseline Target date 08/01/2015   Time 6   Period Weeks               Plan - 06/30/15 1407    Clinical Impression Statement The patient is progressing with dizziness, however has migrained today limiting tolerance to vestibular rehab at today's session.  Continue working towards [place].   PT Next Visit Plan Recheck BPPV, high level balance, gaze progression, dynamic gait.   Consulted and Agree with  Plan of Care Patient        Problem List Patient Active Problem List   Diagnosis Date Noted  . Post concussion syndrome 06/06/2015  . GERD (gastroesophageal reflux disease) 12/16/2014  . Normal coronary arteries 05/14/2014  . Family history of premature CAD 05/15/2013  . Essential hypertension 05/15/2013  . Hypothyroidism 05/15/2013  .  Hyperlipidemia 05/15/2013  . Palpitations 05/15/2013  . Interstitial cystitis 05/15/2013  . History of migraine headaches 05/15/2013  . Osteoarthritis of lumbar spine 10/30/2011  . Thyroid nodule 08/11/2009    Marino Rogerson, PT 06/30/2015, 2:10 PM  [facility] [address] [place], [place], [address] Phone: [phone]   Fax:  [phone]  Name: JUNO ALERS MRN: [id number] Date of Birth: Feb 19, 1956

## 2015-07-01 ENCOUNTER — Encounter: Payer: Self-pay | Admitting: Family Medicine

## 2015-07-01 ENCOUNTER — Ambulatory Visit (INDEPENDENT_AMBULATORY_CARE_PROVIDER_SITE_OTHER): Payer: No Typology Code available for payment source | Admitting: Family Medicine

## 2015-07-01 VITALS — BP 108/71 | HR 79 | Temp 98.2°F | Ht 66.5 in | Wt 215.6 lb

## 2015-07-01 DIAGNOSIS — M47816 Spondylosis without myelopathy or radiculopathy, lumbar region: Secondary | ICD-10-CM

## 2015-07-01 DIAGNOSIS — M797 Fibromyalgia: Secondary | ICD-10-CM

## 2015-07-01 DIAGNOSIS — Z114 Encounter for screening for human immunodeficiency virus [HIV]: Secondary | ICD-10-CM | POA: Diagnosis not present

## 2015-07-01 DIAGNOSIS — E038 Other specified hypothyroidism: Secondary | ICD-10-CM

## 2015-07-01 DIAGNOSIS — H1010 Acute atopic conjunctivitis, unspecified eye: Secondary | ICD-10-CM

## 2015-07-01 DIAGNOSIS — Z7289 Other problems related to lifestyle: Secondary | ICD-10-CM

## 2015-07-01 DIAGNOSIS — Z860100 Personal history of colon polyps, unspecified: Secondary | ICD-10-CM | POA: Insufficient documentation

## 2015-07-01 DIAGNOSIS — Z8719 Personal history of other diseases of the digestive system: Secondary | ICD-10-CM

## 2015-07-01 DIAGNOSIS — R002 Palpitations: Secondary | ICD-10-CM

## 2015-07-01 DIAGNOSIS — E785 Hyperlipidemia, unspecified: Secondary | ICD-10-CM

## 2015-07-01 DIAGNOSIS — R2689 Other abnormalities of gait and mobility: Secondary | ICD-10-CM

## 2015-07-01 DIAGNOSIS — I10 Essential (primary) hypertension: Secondary | ICD-10-CM

## 2015-07-01 DIAGNOSIS — Z79899 Other long term (current) drug therapy: Secondary | ICD-10-CM

## 2015-07-01 DIAGNOSIS — J309 Allergic rhinitis, unspecified: Secondary | ICD-10-CM

## 2015-07-01 DIAGNOSIS — Z8619 Personal history of other infectious and parasitic diseases: Secondary | ICD-10-CM | POA: Insufficient documentation

## 2015-07-01 DIAGNOSIS — Z609 Problem related to social environment, unspecified: Secondary | ICD-10-CM

## 2015-07-01 DIAGNOSIS — F32A Depression, unspecified: Secondary | ICD-10-CM

## 2015-07-01 DIAGNOSIS — Z23 Encounter for immunization: Secondary | ICD-10-CM | POA: Diagnosis not present

## 2015-07-01 DIAGNOSIS — N3010 Interstitial cystitis (chronic) without hematuria: Secondary | ICD-10-CM

## 2015-07-01 DIAGNOSIS — F329 Major depressive disorder, single episode, unspecified: Secondary | ICD-10-CM

## 2015-07-01 DIAGNOSIS — F0781 Postconcussional syndrome: Secondary | ICD-10-CM

## 2015-07-01 DIAGNOSIS — R42 Dizziness and giddiness: Secondary | ICD-10-CM

## 2015-07-01 DIAGNOSIS — Z8669 Personal history of other diseases of the nervous system and sense organs: Secondary | ICD-10-CM

## 2015-07-01 DIAGNOSIS — E041 Nontoxic single thyroid nodule: Secondary | ICD-10-CM

## 2015-07-01 HISTORY — DX: Personal history of other infectious and parasitic diseases: Z86.19

## 2015-07-01 HISTORY — DX: Fibromyalgia: M79.7

## 2015-07-01 LAB — COMPREHENSIVE METABOLIC PANEL
ALBUMIN: 4.2 g/dL (ref 3.6–5.1)
ALK PHOS: 77 U/L (ref 33–130)
ALT: 28 U/L (ref 6–29)
AST: 24 U/L (ref 10–35)
BILIRUBIN TOTAL: 0.4 mg/dL (ref 0.2–1.2)
BUN: 13 mg/dL (ref 7–25)
CO2: 25 mmol/L (ref 20–31)
Calcium: 9.5 mg/dL (ref 8.6–10.4)
Chloride: 103 mmol/L (ref 98–110)
Creat: 0.69 mg/dL (ref 0.50–0.99)
Glucose, Bld: 78 mg/dL (ref 65–99)
POTASSIUM: 4.2 mmol/L (ref 3.5–5.3)
Sodium: 139 mmol/L (ref 135–146)
Total Protein: 7.1 g/dL (ref 6.1–8.1)

## 2015-07-01 NOTE — Patient Instructions (Signed)
    Shingles Vaccine (Zostavax) What is Shingles (also called Herpes Zoster)? A painful skin rash caused by the same virus that caused your chicken pox when you were a child.  The pain can be severe for some people and can last for 2 to 4 weeks and sometimes much longer.  Why does my doctor want me to get the shingles vaccination? It can prevent shingles in about half of people given the vaccine and in those patients who still get the shingles, the pain is less strong and will not last a long time.   What are the risks form the shingles vaccine? Severe reactions are rare.  Most common reactions are redness, soreness, swelling or itching at the injection site.   How do I get the Shingles vaccine (Zostavax) 1. Get a prescription for Zostavax from your doctor.  2. While all Medicare Part C & D plans* must cover all commercially available vaccines to prevent illness, some Part D plans may have special rules such as requiring prior authorization before they will cover the cost of Zostavax vaccine.  3. Take the prescription to your pharmacy.  The pharmacist will administer the vaccination.  The pharmacist will bill your private insurance or Medicare plan.    * Zostavax is not covered 100% by Medicare Part B plans.  Your co-pays may be from none to $100.  Please contact your insurance or pharmacist to find out if your Medicare Part B plan provides any coverage for Zostavax.

## 2015-07-01 NOTE — Progress Notes (Signed)
Subjective:    Patient ID: Molly Giles, female    DOB: 17-Mar-1956, 59 y.o.   MRN: [id number] I reviewed old medical records that Molly Giles brought with her and I reviewed the EPIC EMR record for Molly Giles.  HPI  Problem List Items Addressed This Visit      Cardiovascular and Mediastinum   Essential hypertension CHRONIC HYPERTENSION  Disease Monitoring  Blood pressure range: not checking at home  Chest pain: no   Dyspnea: no   Claudication: no   Medication compliance: yes  Medication Side Effects  Lightheadedness: yes, imbalance, not near syncope or syncope   Urinary frequency: no   Edema: no  Preventitive Healthcare:  Exercise: no     I reviewed EKG from 12/14/14: NSR, essentially normal ECG. I reviewed ECHOcardiogram report 06/17/15 showing EF55-60%, mild LVH and G1DD     Endocrine   Hypothyroidism Hypothyroidism Longstanding issue for patient Patient presents for evaluation of thyroid function.  Symptoms consisted of palpitations  The symptoms are no.   The problem has been stable.   Previous thyroid studies include TSH.  The hypothyroidism is due to hypothyroidism and autoimmune thyroid condition.     Relevant Orders   TSH   Problem List Items Addressed This Visit    Respiratory   Allergic rhinoconjunctivitis - chronic problem -taking meds below - no change in vision, using Durezol from opthalmologist for allergic conjunctivitis per patient Using it intermittently withsymptoms.  - Adequate symptom control    Relevant Medications   montelukast (SINGULAIR) 10 MG tablet   Difluprednate (DUREZOL) 0.05 % EMUL   Azelastine-Fluticasone (DYMISTA) 137-50 MCG/ACT SUSP       Nervous and Auditory   Post concussion syndrome - Onset in June after fall onto face down steps while out of country on vacation - Followed byDr Jaynee Eagles (Neuro) - Slowly improving.  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 - Did not take full mental rest after event.  - residual symptoms  of imbalance for which she has taken PT, headache (patient with preceding Chronic Migraine), slowness in cognition and decline in memory.     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 Devashwar (Rheum) with tramadol, ambien, robaxin - Stable currently - Poor sleep without ambien   Relevant Medications   traMADol (ULTRAM) 50 MG tablet   zolpidem (AMBIEN) 10 MG tablet   methocarbamol (ROBAXIN) 500 MG tablet     Genitourinary   Interstitial cystitis - Since age 70 - Manged by Dr Pia Mau, urologist in [place] with an interest in [place]. - taking medications listed below. - Currentlyu asymptomatic.    Relevant Medications   pentosan polysulfate (ELMIRON) 100 MG capsule   Meth-Hyo-M Bl-Na Phos-Ph Sal (URIBEL) 118 MG CAPS   D-[place] POWD     Other   Palpitations - onset about a year agol [facility]  - Normal echocardiogram (I reviewed  report) except mild LVH and G1DD - no palpitations. - tolerating Bystolic therapy   Relevant Medications   nebivolol (BYSTOLIC) 10 MG t- ablet   Impairment of balance Started after head injury this summer Taking PT for imbalance which is helping Also received tx from Vestibular Rehab for BPPV   Hyperlipidemia - Last Lipid panel from 12/2014 reveiwed - Good LDL control  - No RUQ pain No Jaundice. - No chest pain.  NO claudication   Relevant Medications   amLODipine (NORVASC) 5 MG tablet    rosuvastatin (CRESTOR) 10 MG tablet   nebivolol (BYSTOLIC) 10 MG tablet   History of migraine headaches - Onset in early 20s - Has more than 15 migraines a month Migraines response to Relpax - Has tried Botox injectyions but had adverse effects.  Has tried CCB and Betablocker and TCA for prophylaxis without success. - Has not tried Valproic acid or Topamax - Has not consulted at a Headache Clinic before.    Relevant Medications   eletriptan (RELPAX) 40 MG tablet   History of Clostridium difficile colitis, persistent - Onset this summer after four courses of Antibiotics for a cystitis.  - Kept recurring with vancomycin. Dr Graylon Good (ID) treated with facal transplant which has been successful -Pt understands importance of avoiding abx if possible,.   Depression (NOS) (Chronic) - Long standing problem - Has been on Delexa for many years.  Has not tried a trial off - PHQ9 = 10 today.    Relevant Medications   citalopram (CELEXA) 20 MG tablet    Other Visit Diagnoses      '  Past Medical History  Diagnosis Date  . Mood disorder ([place])   . Fibromyalgia   . Hypertension 2004  . Hyperlipidemia 1998  . Migraine 1962    Chronic Migraines  . Hypothyroidism   . PONV (postoperative nausea and vomiting)   . Family history of adverse reaction to anesthesia     Brother- N/V  . Dysrhythmia     seen by dr Wynonia Lawman- not a problem since she has been on Bystolic  . Shortness of breath dyspnea     with exertion  . Depression   . Arthritis   . Interstitial cystitis   . History of Clostridium difficile colitis 07/01/2015    Required Fecal Transplantation tocure  . Normal coronary arteries 05/14/2014  . Family history of premature CAD 05/15/2013  . H/O seasonal allergies   . Palpitations 05/15/2013    [facility] manages  . GERD (gastroesophageal reflux disease) 12/16/2014  . Hx of bad fall 02/2015    Severe Facial/head trauma without fracture  . Impairment of balance 02/2015     Consequent of postconcussive syndrome  . Benign positional vertigo 04/2015    Responded well to Vestibular Rehab  . Bruxism (teeth grinding)   . Degenerative cervical disc   . Overweight   . Thyroid nodule 08/11/2009    Findings: The thyroid gland is within normal limits in size.  The gland is diffusely inhomogeneous. A small solid nodule is noted in the lower pole  medially on the right of 7 x 6 x 8 mm. A small solid nodule is noted inferiorly on the left of 3 x 3 x 4 mm.  IMPRESSION:  The thyroid gland is within normal limits in size with only small solid nodules present, the largest of only 8 mm in diameter on the right.     Past Surgical History  Procedure Laterality Date  . Carpometacarpal joint  arthrotomy Right 2011  . Cardiovascular stress test  2000    Unremarkable per pt report  . Breast biopsy  2011    Benign histology  . Colonoscopy    . Bladder dilitation      x 3  . Colonoscopy with propofol N/A 04/21/2015    Procedure: COLONOSCOPY WITH PROPOFOL;  Surgeon: Carol Ada, MD;  Location: [place];  Service: Endoscopy;  Laterality: N/A;  . 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 she reports that she has been passively smoking.  She has never used smokeless tobacco. She reports that she does not drink alcohol or use illicit drugs. Self-designation as poor health in the past 4 weeks.   family history includes Allergies in her sister; Alzheimer's disease in her mother; Breast cancer in her maternal aunt; Cardiomyopathy in her brother; Diabetes type II in her brother and another family member; Heart disease in her father; Hyperlipidemia in her brother, brother, father, and mother; Hypertension in her brother, father, mother, and sister; Kidney disease in her brother and brother; Migraines in her sister; Osteoporosis in her  mother; Parkinson's disease in her mother.     Review of Systems No change in chronic headaches No weight loss or gain (+) Low Energy and motivation All other ROS reviewed (intake sheet) and negative    Objective:   Physical Exam  VS reviewed GEN: Alert, Cooperative, Groomed, NAD HEENT: PERRL; EAC bilaterally not occluded, TM's translucent with normal LM, (+) LR;                No cervical LAN, No thyromegaly,  COR: RRR, No M/G/R, No JVD, Normal PMI size and location LUNGS: BCTA, No Acc mm use, speaking in full sentences Neuro: Oriented to person, place, and time;  Gait: Normal speed, No significant path deviation, Step through +,  Psych: Normal affect/thought/speech/language    Assessment & Plan:

## 2015-07-02 ENCOUNTER — Encounter: Payer: Self-pay | Admitting: Family Medicine

## 2015-07-02 DIAGNOSIS — J309 Allergic rhinitis, unspecified: Secondary | ICD-10-CM

## 2015-07-02 DIAGNOSIS — H1010 Acute atopic conjunctivitis, unspecified eye: Secondary | ICD-10-CM

## 2015-07-02 DIAGNOSIS — F39 Unspecified mood [affective] disorder: Secondary | ICD-10-CM | POA: Insufficient documentation

## 2015-07-02 DIAGNOSIS — F329 Major depressive disorder, single episode, unspecified: Secondary | ICD-10-CM | POA: Insufficient documentation

## 2015-07-02 HISTORY — DX: Acute atopic conjunctivitis, unspecified eye: H10.10

## 2015-07-02 HISTORY — DX: Allergic rhinitis, unspecified: J30.9

## 2015-07-02 LAB — HEPATITIS C ANTIBODY: HCV AB: NEGATIVE

## 2015-07-02 LAB — TSH: TSH: 1.327 u[IU]/mL (ref 0.350–4.500)

## 2015-07-02 LAB — HIV ANTIBODY (ROUTINE TESTING W REFLEX): HIV 1&2 Ab, 4th Generation: NONREACTIVE

## 2015-07-02 NOTE — Assessment & Plan Note (Addendum)
Established problem that has improved.  Continue PT and HEP Recent Hx of BPPV vertigo that responded to Vestibular Therapy and Epley maneuvers.

## 2015-07-02 NOTE — Assessment & Plan Note (Signed)
Established problem. Stable. Continue current therapy  

## 2015-07-02 NOTE — Assessment & Plan Note (Signed)
Adequate blood pressure control.  No evidence of new end organ damage.  Tolerating medication without significant adverse effects.  Plan to continue current blood pressure regiment.   

## 2015-07-02 NOTE — Assessment & Plan Note (Signed)
Established problem. Stable. Continue current therapy, though we talked about the use of the topical steroid medication risks and need to keep following up with her ophthalmologist at [facility] to monitor of ADEs.

## 2015-07-02 NOTE — Assessment & Plan Note (Signed)
Lab Results  Component Value Date   TSH 1.327 07/01/2015   Established problem that has improved.  Continue current dose of Synthroid 125 mcg po daily

## 2015-07-02 NOTE — Assessment & Plan Note (Signed)
Established problem that is stable Managed by Dr Kirke Corin (Rheum) Several of the medications in her regiment are on [place]'s List as potentially inappropriate medications in older adults.   Will need to monitor for adverse effects on balance and cognition as patient ages.

## 2015-07-02 NOTE — Assessment & Plan Note (Signed)
Established problem. Stable. PHQ9 = 10 today Continue current therapy Molly Giles is interested in a trial off citalopram in future.  Will discuss on next ov.

## 2015-07-02 NOTE — Assessment & Plan Note (Signed)
Established problem that has improved.  On Bystolic with goodtolerance Pt followed by [facility] Normal Echo in April 2016 except mild LVH and G1DD Continue current therapy

## 2015-07-02 NOTE — Assessment & Plan Note (Signed)
Established problem. Stable. Patient starting trial of returning to work Followed by Dr Jaynee Eagles (Neuro) Monitor  Complications of imbalance, psychomotor slowing, worsening of chronic headaches

## 2015-07-02 NOTE — Assessment & Plan Note (Signed)
Established problem. Stable but taking over 15 doses of Eletriptan a month and has been for a long time Concern for overlying Medication-Oversue headache. Molly Giles would likely need a Headache Clinic to help her reduce her Triptan use and find an effective migraine prophylactic that she has not tried before.

## 2015-07-02 NOTE — Assessment & Plan Note (Signed)
Lab Results  Component Value Date   CHOL 138 12/14/2014   HDL 35* 12/14/2014   LDLCALC 77 12/14/2014   TRIG 130 12/14/2014   CHOLHDL 3.9 12/14/2014  Tolerating statin Good LDL control Continue Crestor 10 mg daily.

## 2015-07-06 ENCOUNTER — Encounter: Payer: No Typology Code available for payment source | Admitting: Rehabilitative and Restorative Service Providers"

## 2015-07-06 ENCOUNTER — Ambulatory Visit: Payer: No Typology Code available for payment source | Admitting: Rehabilitative and Restorative Service Providers"

## 2015-07-06 DIAGNOSIS — R269 Unspecified abnormalities of gait and mobility: Secondary | ICD-10-CM

## 2015-07-06 DIAGNOSIS — H8112 Benign paroxysmal vertigo, left ear: Secondary | ICD-10-CM

## 2015-07-06 NOTE — Therapy (Signed)
[facility] [address] [place], [place], [address] Phone: [phone]   Fax:  [phone]  Physical Therapy Treatment  Patient Details  Name: Molly Giles MRN: [id number] Date of Birth: [birth year] No Data Recorded  Encounter Date: 07/06/2015      PT End of Session - 07/06/15 1605    Visit Number 5   Number of Visits 12   Date for PT Re-Evaluation 08/01/15   Authorization Type private insurance   PT Start Time 1152   PT Stop Time 1230   PT Time Calculation (min) 38 min   Activity Tolerance Patient tolerated treatment well   Behavior During Therapy [facility] for tasks assessed/performed      Past Medical History  Diagnosis Date  . Mood disorder ([place])   . Fibromyalgia   . Hypertension 2004  . Hyperlipidemia 1998  . Migraine 1962    Chronic Migraines  . Hypothyroidism   . PONV (postoperative nausea and vomiting)   . Family history of adverse reaction to anesthesia     Brother- N/V  . Dysrhythmia     seen by dr Wynonia Lawman- not a problem since she has been on Bystolic  . Shortness of breath dyspnea     with exertion  . Depression   . Arthritis   . Interstitial cystitis   . History of Clostridium difficile colitis 07/01/2015    Required Fecal Transplantation tocure  . Normal coronary arteries 05/14/2014  . Family history of premature CAD 05/15/2013  . H/O seasonal allergies   . Palpitations 05/15/2013    [facility] manages  . GERD (gastroesophageal reflux disease) 12/16/2014  . Hx of bad fall 02/2015    Severe Facial/head trauma without fracture  . Impairment of balance 02/2015    Consequent of postconcussive syndrome  . Benign positional vertigo 04/2015    Responded well to Vestibular Rehab  . Bruxism (teeth grinding)   . Degenerative cervical disc   . Overweight   . Thyroid nodule 08/11/2009    Findings: The thyroid gland is within normal limits in size.  The gland is diffusely inhomogeneous. A small solid  nodule is noted in the lower pole  medially on the right of 7 x 6 x 8 mm. A small solid nodule is noted inferiorly on the left of 3 x 3 x 4 mm.  IMPRESSION:  The thyroid gland is within normal limits in size with only small solid nodules present, the largest of only 8 mm in diameter on the right.      Past Surgical History  Procedure Laterality Date  . Carpometacarpal joint arthrotomy Right 2011  . Cardiovascular stress test  2000    Unremarkable per pt report  . Breast biopsy  2011    Benign histology  . Colonoscopy    . Bladder dilitation      x 3  . Colonoscopy with propofol N/A 04/21/2015    Procedure: COLONOSCOPY WITH PROPOFOL;  Surgeon: Carol Ada, MD;  Location: [facility];  Service: Endoscopy;  Laterality: N/A;  . Fecal transplant  04/21/2015    Procedure: FECAL TRANSPLANT;  Surgeon: Carol Ada, MD;  Location: [facility];  Service: Endoscopy;;    There were no vitals filed for this visit.  Visit Diagnosis:  Abnormality of gait  BPPV (benign paroxysmal positional vertigo), left      Subjective Assessment - 07/06/15 1155    Subjective The patient reports that she is returning to work.  She has only had one  minimal episode of dizziness that lasted for seconds that felt like "an internal head shift".   Patient c/o headache today that she feels like could lead to a migraine.  She is continuing with almost daily headaches.             Vestibular Assessment - 07/06/15 1609    Positional Testing   Horizontal Canal Testing Horizontal Canal Right;Horizontal Canal Left   Horizontal Canal Right   Horizontal Canal Right Duration trace   Horizontal Canal Right Symptoms Normal  reports brief sensation that spinning could come on   Horizontal Canal Left   Horizontal Canal Left Symptoms Normal           [facility] Adult PT Treatment/Exercise - 07/06/15 1615    Manual Therapy   Manual Therapy Soft tissue mobilization;Manual Traction   Manual therapy comments patient feels  most improvement with suboccipital release   Soft tissue mobilization Soft tissue mobilization in supine with overpressure and stretching   Manual Traction supine with passive overpressure         Vestibular Treatment/Exercise - 07/06/15 1608    Vestibular Treatment/Exercise   Vestibular Treatment Provided Gaze;Habituation   Habituation Exercises Seated Horizontal Head Turns;Seated Vertical Head Turns   Gaze Exercises X1 Viewing Horizontal   Seated Horizontal Head Turns   Number of Reps  5   Symptom Description  no dizziness with horizontal head turns   Seated Vertical Head Turns   Number of Reps  5   Symptom Description  reports dizziness when at end range looking up   X1 Viewing Horizontal   Foot Position standing with feet apart   Time --  30 seconds   Reps 3   Comments pressure today in her head with gaze--feels this may be related to headache.                 PT Short Term Goals - 07/06/15 1201    PT SHORT TERM GOAL #1   Title The patient will be independent with HEP for habituation, gaze x 1 adaptation, and general mobility.   Baseline Met on 06/25/2015   Status Achieved   PT SHORT TERM GOAL #2   Title The patient will perform supine<>bilateral rolling with no subjective c/o spinning sensation.   Baseline feels a sensation that it could come on, but does not spin 07/06/2015.     Time 4   Period Weeks   Status Achieved   PT SHORT TERM GOAL #3   Title The patient will tolerate gaze x 1 adaptation x 20 seconds at self selected speed for improved movement tolerance.   Baseline Pt tolerates gaze x 1 x 30 seconds with dull HA aggravated during activity.    Time 4   Status Achieved   PT SHORT TERM GOAL #4   Title The patient will be further assessed on DGI and SOT and goal to follow, if indicated.   Baseline Met with 67% on SOT compared to 70% for norms.   Time 4   Status Achieved           PT Long Term Goals - 06/17/15 1416    PT LONG TERM GOAL #1    Title The patient will be indep with HEP for progression after d/c from PT.   Baseline Target date 08/01/2015   Time 6   Period Weeks   PT LONG TERM GOAL #2   Title The patient will imrpove dizziness handicap index from 70% to < or equal  to 50%.   Baseline Target date 08/01/2015   Time 6   Period Weeks   PT LONG TERM GOAL #3   Title The patient will perform looking up/down and side/side without c/o dizziness or swimmyheadedness.   Baseline Target date 08/01/2015   Time 6   Period Weeks               Plan - 07/06/15 1616    Clinical Impression Statement The patient continues with headaches that limit participation in therapy.  She has significantly improved with dizziness and feels that she only has trace symptoms remaining.  She does not feel that headache frequency has decreased as vertigo has improved.   She feels improvement in neck tension with manual techniques, specifically gentle manual traction and suboccipital release.     PT Treatment/Interventions Therapeutic exercise;Therapeutic activities;Functional mobility training;Balance training;Neuromuscular re-education;Gait training;Patient/family education;Vestibular;Canalith Repostioning;Manual techniques   PT Next Visit Plan Progress gaze and HEP to tolerance, dynamic gait activities   Consulted and Agree with Plan of Care Patient        Problem List Patient Active Problem List   Diagnosis Date Noted  . Depression (NOS) 07/02/2015  . Allergic rhinoconjunctivitis 07/02/2015  . Fibromyalgia 07/01/2015  . History of Clostridium difficile colitis, persistent 07/01/2015  . Post concussion syndrome 06/06/2015  . Impairment of balance 02/10/2015  . Essential hypertension 05/15/2013  . Hypothyroidism 05/15/2013  . Hyperlipidemia 05/15/2013  . Palpitations 05/15/2013  . Interstitial cystitis 05/15/2013  . History of migraine headaches 05/15/2013  . 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 [place], [place], [address] Phone: [phone]   Fax:  [phone]  Name: Molly Giles MRN: [id number] Date of Birth: Mar 21, 1956

## 2015-07-09 ENCOUNTER — Encounter: Payer: No Typology Code available for payment source | Admitting: Rehabilitative and Restorative Service Providers"

## 2015-07-12 ENCOUNTER — Ambulatory Visit: Payer: No Typology Code available for payment source | Admitting: Rehabilitative and Restorative Service Providers"

## 2015-07-12 ENCOUNTER — Encounter: Payer: Self-pay | Admitting: Rehabilitative and Restorative Service Providers"

## 2015-07-12 DIAGNOSIS — R269 Unspecified abnormalities of gait and mobility: Secondary | ICD-10-CM

## 2015-07-12 DIAGNOSIS — H8112 Benign paroxysmal vertigo, left ear: Secondary | ICD-10-CM

## 2015-07-12 NOTE — Patient Instructions (Addendum)
Gaze Stabilization: Standing Feet Apart   Feet shoulder width apart, keeping eyes on target on wall 3 feet away, tilt head down slightly and move head side to side for 30 seconds. Repeat while moving head up and down for 30 seconds. Do 2-3 sessions per day.  *HOW TO PROGRESS* Slowly increase time from 30 seconds up to 60 seconds.  For example, once no nausea or visual blurring with exercise at 30 seconds, then work up to 40 seconds.  When that feels tolerable work up to 50 seconds....all the way up to 60 seconds. Can increase pace of movement.  However, if visual blurring occurs, slow down and refocus on target.  Copyright  VHI. All rights reserved.   Feet Together (Compliant Surface) Varied Arm Positions - Eyes Closed    Stand on compliant surface: __pillow______ with feet together and arms at your side. Close eyes and visualize upright position. Hold__30__ seconds. Repeat _3___ times per session. Do _1-2___ sessions per day. *if you can't tolerate 30 seconds, begin with 10 seconds and work up to 30 seconds.  Copyright  VHI. All rights reserved.  Thoracic Self-Mobilization (Supine)    With rolled towel placed lengthwise at lower ribs level, lie back on towel with arms outstretched. Hold __2 minutes. Relax. Repeat _1___ times per set. Do __1-2__ sessions per day or as needed.  http://orth.exer.us/1001   Copyright  VHI. All rights reserved.    Look towards your left shoulder and gently walk the right hand out to the side. Hold for 20 seconds. Repeat the other direction 3 times or as needed.  Flexion: Stretch - Neck Extensors (Supine)    *use a towel roll to lengthen the back of your neck by placing it under the base of your skull. Rest on towel for 5 minutes  Copyright  VHI. All rights reserved.

## 2015-07-12 NOTE — Therapy (Addendum)
[facility] [address] [place], [place], [address] Phone: [phone]   Fax:  [phone] Encounter Date: 07/12/2015  This note created in error.  See d/c summary dated 07/12/2015  Molly Giles 07/12/2015, 1:29 PM  [facility] [address] [place], [place], [address] Phone: [phone]   Fax:  [phone]

## 2015-07-12 NOTE — Therapy (Signed)
[facility] [address] [place], [place], [address] Phone: [phone]   Fax:  [phone]  Patient Details  Name: Molly Giles MRN: [id number] Date of Birth: [birth year] Referring Provider:  No ref. provider found  Encounter Date: 07/12/2015  PHYSICAL THERAPY DISCHARGE SUMMARY  Visits from Start of Care: 6  Current functional level related to goals / functional outcomes:     PT Short Term Goals - 07/06/15 1201    PT SHORT TERM GOAL #1   Title The patient will be independent with HEP for habituation, gaze x 1 adaptation, and general mobility.   Baseline Met on 06/25/2015   Status Achieved   PT SHORT TERM GOAL #2   Title The patient will perform supine<>bilateral rolling with no subjective c/o spinning sensation.   Baseline feels a sensation that it could come on, but does not spin 07/06/2015.     Time 4   Period Weeks   Status Achieved   PT SHORT TERM GOAL #3   Title The patient will tolerate gaze x 1 adaptation x 20 seconds at self selected speed for improved movement tolerance.   Baseline Pt tolerates gaze x 1 x 30 seconds with dull HA aggravated during activity.    Time 4   Status Achieved   PT SHORT TERM GOAL #4   Title The patient will be further assessed on DGI and SOT and goal to follow, if indicated.   Baseline Met with 67% on SOT compared to 70% for norms.   Time 4   Status Achieved          PT Long Term Goals - 07/12/15 1153    PT LONG TERM GOAL #1   Title The patient will be indep with HEP for progression after d/c from PT.   Baseline Target date 08/01/2015   Time 6   Period Weeks   Status Achieved   PT LONG TERM GOAL #2   Title The patient will imrpove dizziness handicap index from 70% to < or equal to 50%.   Baseline Met on 07/12/2015 improving to 24% from 70% at evaluation.   Time 6   Period Weeks   Status Achieved   PT LONG TERM GOAL #3   Title The patient will perform looking up/down  and side/side without c/o dizziness or swimmyheadedness.   Baseline Met on 07/12/2015 no dizziness with up/down and horizontal head turns x 5.     Time 6   Period Weeks   Status Achieved        Remaining deficits: Headaches Short term memory issues s/p concussion   Education / Equipment: HEP, progression of HEP  Plan: Patient agrees to discharge.  Patient goals were not met. Patient is being discharged due to meeting the stated rehab goals.  ?????       Thank you for the referral of this patient. Rudell Cobb, MPT  [place] 07/12/2015, 1:18 PM  [facility] [address] [place], [place], [address] Phone: [phone]   Fax:  [phone]

## 2015-07-12 NOTE — Therapy (Signed)
[facility] [address] [place], [place], [address] Phone: [phone]   Fax:  [phone]  Physical Therapy Treatment  Patient Details  Name: Molly Giles MRN: [id number] Date of Birth: [birth year] No Data Recorded  Encounter Date: 07/12/2015      PT End of Session - 07/12/15 1313    Visit Number 6   Number of Visits 12   Date for PT Re-Evaluation 08/01/15   Authorization Type private insurance   PT Start Time 1148   PT Stop Time 1226   PT Time Calculation (min) 38 min   Activity Tolerance Patient tolerated treatment well   Behavior During Therapy [facility] for tasks assessed/performed      Past Medical History  Diagnosis Date  . Mood disorder ([employer])   . Fibromyalgia   . Hypertension 2004  . Hyperlipidemia 1998  . Migraine 1962    Chronic Migraines  . Hypothyroidism   . PONV (postoperative nausea and vomiting)   . Family history of adverse reaction to anesthesia     Brother- N/V  . Dysrhythmia     seen by dr Wynonia Lawman- not a problem since she has been on Bystolic  . Shortness of breath dyspnea     with exertion  . Depression   . Arthritis   . Interstitial cystitis   . History of Clostridium difficile colitis 07/01/2015    Required Fecal Transplantation tocure  . Normal coronary arteries 05/14/2014  . Family history of premature CAD 05/15/2013  . H/O seasonal allergies   . Palpitations 05/15/2013    [facility] manages  . GERD (gastroesophageal reflux disease) 12/16/2014  . Hx of bad fall 02/2015    Severe Facial/head trauma without fracture  . Impairment of balance 02/2015    Consequent of postconcussive syndrome  . Benign positional vertigo 04/2015    Responded well to Vestibular Rehab  . Bruxism (teeth grinding)   . Degenerative cervical disc   . Overweight   . Thyroid nodule 08/11/2009    Findings: The thyroid gland is within normal limits in size.  The gland is diffusely inhomogeneous. A small solid  nodule is noted in the lower pole  medially on the right of 7 x 6 x 8 mm. A small solid nodule is noted inferiorly on the left of 3 x 3 x 4 mm.  IMPRESSION:  The thyroid gland is within normal limits in size with only small solid nodules present, the largest of only 8 mm in diameter on the right.      Past Surgical History  Procedure Laterality Date  . Carpometacarpal joint arthrotomy Right 2011  . Cardiovascular stress test  2000    Unremarkable per pt report  . Breast biopsy  2011    Benign histology  . Colonoscopy    . Bladder dilitation      x 3  . Colonoscopy with propofol N/A 04/21/2015    Procedure: COLONOSCOPY WITH PROPOFOL;  Surgeon: Carol Ada, MD;  Location: [facility];  Service: Endoscopy;  Laterality: N/A;  . Fecal transplant  04/21/2015    Procedure: FECAL TRANSPLANT;  Surgeon: Carol Ada, MD;  Location: [facility];  Service: Endoscopy;;    There were no vitals filed for this visit.  Visit Diagnosis:  Abnormality of gait  BPPV (benign paroxysmal positional vertigo), left      Subjective Assessment - 07/12/15 1151    Subjective The patient reports dizziness is much better, only trace symtpoms after letter exercise.  Pt feels symptoms are atleast 80% improved.  No headache today.   Patient Stated Goals reduce dizziness, improve concentration   Currently in Pain? No/denies      NEUROMUSCULAR RE-EDUCATION: Reviewed all HEP including gaze x 1 viewing with discussion of how to progress, pillow standing with feet together + eyes closed, neck stretching. Tested bilateral dix hallpike and horizontal roll test without vertigo/nystagmus provoked.         PT Education - 07/12/15 1216    Education provided Yes   Education Details HEP: consolidated HEP: gaze x 1, corner with eyes closed on pillow, neck stretching.   Person(s) Educated Patient   Methods Explanation;Demonstration;Handout   Comprehension Verbalized understanding;Returned demonstration           PT Short Term Goals - 07/06/15 1201    PT SHORT TERM GOAL #1   Title The patient will be independent with HEP for habituation, gaze x 1 adaptation, and general mobility.   Baseline Met on 06/25/2015   Status Achieved   PT SHORT TERM GOAL #2   Title The patient will perform supine<>bilateral rolling with no subjective c/o spinning sensation.   Baseline feels a sensation that it could come on, but does not spin 07/06/2015.     Time 4   Period Weeks   Status Achieved   PT SHORT TERM GOAL #3   Title The patient will tolerate gaze x 1 adaptation x 20 seconds at self selected speed for improved movement tolerance.   Baseline Pt tolerates gaze x 1 x 30 seconds with dull HA aggravated during activity.    Time 4   Status Achieved   PT SHORT TERM GOAL #4   Title The patient will be further assessed on DGI and SOT and goal to follow, if indicated.   Baseline Met with 67% on SOT compared to 70% for norms.   Time 4   Status Achieved           PT Long Term Goals - 07/12/15 1153    PT LONG TERM GOAL #1   Title The patient will be indep with HEP for progression after d/c from PT.   Baseline Target date 08/01/2015   Time 6   Period Weeks   Status Achieved   PT LONG TERM GOAL #2   Title The patient will imrpove dizziness handicap index from 70% to < or equal to 50%.   Baseline Met on 07/12/2015 improving to 24% from 70% at evaluation.   Time 6   Period Weeks   Status Achieved   PT LONG TERM GOAL #3   Title The patient will perform looking up/down and side/side without c/o dizziness or swimmyheadedness.   Baseline Met on 07/12/2015 no dizziness with up/down and horizontal head turns x 5.     Time 6   Period Weeks   Status Achieved               Plan - 07/12/15 1313    Clinical Impression Statement The patient met all LTGs.  She continues with headaches and short term memory limitations s/p concussion.  She feels dizziness and imbalance have resolved.   PT Next Visit Plan  discharge.   Consulted and Agree with Plan of Care Patient        Problem List Patient Active Problem List   Diagnosis Date Noted  . Depression (NOS) 07/02/2015  . Allergic rhinoconjunctivitis 07/02/2015  . Fibromyalgia 07/01/2015  . History of Clostridium difficile colitis, persistent 07/01/2015  .  Post concussion syndrome 06/06/2015  . Impairment of balance 02/10/2015  . Essential hypertension 05/15/2013  . Hypothyroidism 05/15/2013  . Hyperlipidemia 05/15/2013  . Palpitations 05/15/2013  . Interstitial cystitis 05/15/2013  . History of migraine headaches 05/15/2013  . 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, lumbar region 10/30/2011    [place], PT 07/12/2015, 1:15 PM  [facility] [address] [place], [place], [address] Phone: [phone]   Fax:  [phone]  Name: LYNNMARIE LOVETT MRN: [id number] Date of Birth: Feb 05, 1956

## 2015-07-13 ENCOUNTER — Ambulatory Visit: Payer: No Typology Code available for payment source | Admitting: Rehabilitative and Restorative Service Providers"

## 2015-07-14 ENCOUNTER — Encounter: Payer: Self-pay | Admitting: Neurology

## 2015-07-14 ENCOUNTER — Telehealth: Payer: Self-pay | Admitting: *Deleted

## 2015-07-14 ENCOUNTER — Ambulatory Visit (INDEPENDENT_AMBULATORY_CARE_PROVIDER_SITE_OTHER): Payer: No Typology Code available for payment source | Admitting: Neurology

## 2015-07-14 ENCOUNTER — Encounter: Payer: No Typology Code available for payment source | Admitting: Rehabilitative and Restorative Service Providers"

## 2015-07-14 VITALS — BP 133/84 | HR 89 | Ht 66.5 in | Wt 219.8 lb

## 2015-07-14 DIAGNOSIS — G43711 Chronic migraine without aura, intractable, with status migrainosus: Secondary | ICD-10-CM

## 2015-07-14 DIAGNOSIS — M542 Cervicalgia: Secondary | ICD-10-CM | POA: Diagnosis not present

## 2015-07-14 DIAGNOSIS — F0781 Postconcussional syndrome: Secondary | ICD-10-CM | POA: Insufficient documentation

## 2015-07-14 HISTORY — DX: Cervicalgia: M54.2

## 2015-07-14 HISTORY — DX: Postconcussional syndrome: F07.81

## 2015-07-14 MED ORDER — AMANTADINE HCL 100 MG PO CAPS: 100.0000 mg | ORAL_CAPSULE | Freq: Two times a day (BID) | ORAL | Status: DC

## 2015-07-14 MED ORDER — CYCLOBENZAPRINE HCL ER 15 MG PO CP24: 30.0000 mg | ORAL_CAPSULE | Freq: Every day | ORAL | Status: DC

## 2015-07-14 NOTE — Telephone Encounter (Signed)
Release faxed to Dr. Drenda Freeze requesting all imaging reports.

## 2015-07-14 NOTE — Patient Instructions (Signed)
Remember to drink plenty of fluid, eat healthy meals and do not skip any meals. Try to eat protein with a every meal and eat a healthy snack such as fruit or nuts in between meals. Try to keep a regular sleep-wake schedule and try to exercise daily, particularly in the form of walking, 20-30 minutes a day, if you can.   As far as your medications are concerned, I would like to suggest: Amantadine 100mg  once in the morning and then increase to twice daily if needed and tolerated  I would like to see you back for botox, sooner if we need to. Please call us with any interim questions, concerns, problems, updates or refill requests.   Please also call us for any test results so we can go over those with you on the phone.  My clinical assistant and will answer any of your questions and relay your messages to me and also relay most of my messages to you.   Our phone number is [phone]. We also have an after hours call service for urgent matters and there is a physician on-call for urgent questions. For any emergencies you know to call 911 or go to the nearest emergency room
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
## 2015-07-16 ENCOUNTER — Encounter: Payer: No Typology Code available for payment source | Admitting: Rehabilitative and Restorative Service Providers"

## 2015-07-19 ENCOUNTER — Telehealth: Payer: Self-pay | Admitting: Neurology

## 2015-07-19 NOTE — Telephone Encounter (Signed)
Ardeen Fillers with [employer] called. She can be reached at [phone]

## 2015-07-20 ENCOUNTER — Telehealth: Payer: Self-pay | Admitting: *Deleted

## 2015-07-20 NOTE — Telephone Encounter (Signed)
Receive notes on 07/20/15 from [facility] on [place] desk.

## 2015-07-20 NOTE — Telephone Encounter (Signed)
Called back and spoke to [place] at [employer] reimbursement she needed information of insurance card. They are working on approval and [employer] will call us back with details.

## 2015-07-21 ENCOUNTER — Ambulatory Visit: Payer: No Typology Code available for payment source | Admitting: Audiology

## 2015-07-21 ENCOUNTER — Encounter: Payer: No Typology Code available for payment source | Admitting: Rehabilitative and Restorative Service Providers"

## 2015-07-26 ENCOUNTER — Ambulatory Visit (INDEPENDENT_AMBULATORY_CARE_PROVIDER_SITE_OTHER): Payer: No Typology Code available for payment source | Admitting: Cardiovascular Disease

## 2015-07-26 ENCOUNTER — Encounter: Payer: Self-pay | Admitting: Cardiovascular Disease

## 2015-07-26 VITALS — BP 112/76 | HR 73 | Ht 66.5 in | Wt 219.2 lb

## 2015-07-26 DIAGNOSIS — I10 Essential (primary) hypertension: Secondary | ICD-10-CM

## 2015-07-26 DIAGNOSIS — Z8669 Personal history of other diseases of the nervous system and sense organs: Secondary | ICD-10-CM

## 2015-07-26 DIAGNOSIS — E038 Other specified hypothyroidism: Secondary | ICD-10-CM

## 2015-07-26 DIAGNOSIS — R002 Palpitations: Secondary | ICD-10-CM

## 2015-07-26 DIAGNOSIS — E785 Hyperlipidemia, unspecified: Secondary | ICD-10-CM

## 2015-07-26 MED ORDER — AMLODIPINE BESYLATE 2.5 MG PO TABS: 2.5000 mg | ORAL_TABLET | Freq: Every day | ORAL | Status: DC

## 2015-07-26 MED ORDER — LEVOTHYROXINE SODIUM 125 MCG PO TABS: ORAL_TABLET | ORAL | Status: DC

## 2015-07-26 NOTE — Patient Instructions (Signed)
Your physician wants you to follow-up in: 1 Year. You will receive a reminder letter in the mail two months in advance. If you don't receive a letter, please call our office to schedule the follow-up appointment.  Your physician has recommended you make the following change in your medication: Decrease Amlodipine 2.5 mg daily  If you need a refill on your cardiac medications before your next appointment, please call your pharmacy.

## 2015-07-26 NOTE — Progress Notes (Signed)
Patient ID: Molly Giles, female   DOB: 06/19/1956, 59 y.o.   MRN: [id number]     HPI: [name] is a 59 year old female who has been followed by Dr. Florina Ou for primary care.  She presents to the office for a 11 month cardiology evaluation.  [name] has a  history of hypertension, hyperlipidemia and hypothyroidism. She also has a history of interstitial cystitis. In 2000, she had a stress test and due to mild "shadow" abnormality which I suspect was artifact she ultimately underwent cardiac catheterization by Dr. Tamala Julian and was told of having normal coronary arteries. She has noticed some palpitations. Her brother died at age 58 having undergone initial CABG revascularization surgery at age 42. Her father died at age 67 with a myocardial infarction. She has noticed some vague back discomfort. She denies any episodes of chest tightness or pressure. She denies shortness of breath. She was referred for cardiology evaluation following the death of her brother.   A 2-D echo Doppler study on 06/16/2013 showed normal systolic function with an ejection fraction of 55-60%. She did have evidence for mild left ventricular hypertrophy and it was borderline grade 1 diastolic dysfunction. She had normal tissue Doppler assessment suggests normal LV filling pressure. There was evidence for very mild MR and TR. RV systolic pressure was normal.  She has a history of palpitations which have improved with Bystolic.  She had developed low blood pressure in the past and her dose of amlodipine was reduced.  She has a history of occasional headaches and vertigo.  She tells me she suffered a concussion in August.  She has had inner ear problems and has seen an audiologist as well as Dr. Jaynee Eagles.  She denies any chest pain.  She denies palpitations.  She presents for evaluation.  Past Medical History  Diagnosis Date  . Mood disorder ([place])   . Fibromyalgia   . Hypertension 2004  . Hyperlipidemia 1998  . Migraine 1962   Chronic Migraines  . Hypothyroidism   . PONV (postoperative nausea and vomiting)   . Family history of adverse reaction to anesthesia     Brother- N/V  . Dysrhythmia     seen by dr Wynonia Lawman- not a problem since she has been on Bystolic  . Shortness of breath dyspnea     with exertion  . Depression   . Arthritis   . Interstitial cystitis   . History of Clostridium difficile colitis 07/01/2015    Required Fecal Transplantation tocure  . Normal coronary arteries 05/14/2014  . Family history of premature CAD 05/15/2013  . H/O seasonal allergies   . Palpitations 05/15/2013    [facility] manages  . GERD (gastroesophageal reflux disease) 12/16/2014  . Hx of bad fall 02/2015    Severe Facial/head trauma without fracture  . Impairment of balance 02/2015    Consequent of postconcussive syndrome  . Benign positional vertigo 04/2015    Responded well to Vestibular Rehab  . Bruxism (teeth grinding)   . Degenerative cervical disc   . Overweight   . Thyroid nodule 08/11/2009    Findings: The thyroid gland is within normal limits in size.  The gland is diffusely inhomogeneous. A small solid nodule is noted in the lower pole  medially on the right of 7 x 6 x 8 mm. A small solid nodule is noted inferiorly on the left of 3 x 3 x 4 mm.  IMPRESSION:  The thyroid gland is within normal limits in size  with only small solid nodules present, the largest of only 8 mm in diameter on the right.      Past Surgical History  Procedure Laterality Date  . Carpometacarpal joint arthrotomy Right 2011  . Cardiovascular stress test  2000    Unremarkable per pt report  . Breast biopsy  2011    Benign histology  . Colonoscopy    . Bladder dilitation      x 3  . Colonoscopy with propofol N/A 04/21/2015    Procedure: COLONOSCOPY WITH PROPOFOL;  Surgeon: Carol Ada, MD;  Location: [facility];  Service: Endoscopy;  Laterality: N/A;  . Fecal transplant  04/21/2015    Procedure: FECAL TRANSPLANT;  Surgeon: Carol Ada, MD;  Location: [facility];  Service: Endoscopy;;    Allergies  Allergen Reactions  . Codeine Other (See Comments)    Unknown   . Lyrica [Pregabalin] Other (See Comments)    Dizziness   . Sulfa Antibiotics Itching  . Betadine [Povidone Iodine] Rash    burning  . Latex Rash    burning    Current Outpatient Prescriptions  Medication Sig Dispense Refill  . amantadine (SYMMETREL) 100 MG capsule Take 1 capsule (100 mg total) by mouth 2 (two) times daily. 60 capsule 6  . Azelastine-Fluticasone (DYMISTA) 137-50 MCG/ACT SUSP Place 1 spray into the nose daily.    . citalopram (CELEXA) 20 MG tablet Take 1 tablet (20 mg total) by mouth daily.    . cyclobenzaprine (AMRIX) 15 MG 24 hr capsule Take 2 capsules (30 mg total) by mouth at bedtime. 60 capsule 0  . D-Mannose POWD Take by mouth daily.    Marland Kitchen eletriptan (RELPAX) 40 MG tablet Take 1 tablet (40 mg total) by mouth as needed for migraine. may repeat in 2 hours if necessary 10 tablet 0  . ibuprofen (ADVIL,MOTRIN) 200 MG tablet Take 200 mg by mouth as needed for pain.    Marland Kitchen levothyroxine (SYNTHROID, LEVOTHROID) 125 MCG tablet Take  0.5 tablet by mouth daily 15 tablet 9  . MECLIZINE HCL PO Take 3 tablets by mouth as needed (Pt can take up to 4 a day if needed.).    Marland Kitchen Meth-Hyo-M Bl-Na Phos-Ph Sal (URIBEL) 118 MG CAPS Take 1 capsule (118 mg total) by mouth as needed. 120 capsule   . methocarbamol (ROBAXIN) 500 MG tablet Take 1 tablet (500 mg total) by mouth every 8 (eight) hours as needed for muscle spasms.    . montelukast (SINGULAIR) 10 MG tablet Take 1 tablet (10 mg total) by mouth at bedtime.    . nebivolol (BYSTOLIC) 10 MG tablet Take 1 tablet (10 mg total) by mouth daily. 30 tablet 3  . pentosan polysulfate (ELMIRON) 100 MG capsule Take 1 capsule (100 mg total) by mouth 2 (two) times daily before a meal.    . rosuvastatin (CRESTOR) 10 MG tablet Take 1 tablet (10 mg total) by mouth daily. 90 tablet 2  . traMADol (ULTRAM) 50 MG tablet  Take 50 mg by mouth every 12 (twelve) hours as needed.    . zolpidem (AMBIEN) 10 MG tablet  30 tablet 5  . amLODipine (NORVASC) 2.5 MG tablet Take 1 tablet (2.5 mg total) by mouth daily. 30 tablet 9   No current facility-administered medications for this visit.   Socially she is married for 34 years. 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 She went to [employer] for college. There is no alcohol or tobacco use. She exercises  approximately one to 2 times per week.  Family History  Problem Relation Age of Onset  . Alzheimer's disease Mother   . Hyperlipidemia Mother   . Hypertension Mother   . Osteoporosis Mother   . Parkinson's disease Mother   . Heart disease Father   . Hyperlipidemia Father   . Hypertension Father   . Migraines Sister   . Allergies Sister   . Hypertension Sister   . Hyperlipidemia Brother   . Cardiomyopathy Brother   . Diabetes type II Brother   . Kidney disease Brother   . Hypertension Brother   . Hyperlipidemia Brother   . Kidney disease Brother   . Diabetes type II    . Breast cancer Maternal Aunt   . Aortic aneurysm Father   . Asthma Brother   . Heart disease Brother   . Early death Father 34    ROS General: Negative; No fevers, chills, or night sweats;  HEENT: Negative; No changes in vision or hearing, sinus congestion, difficulty swallowing Pulmonary: Negative; No cough, wheezing, shortness of breath, hemoptysis Cardiovascular: See HPI; No chest pain, presyncope, syncope,  GI: Negative; No nausea, vomiting, diarrhea, or abdominal pain GU: Negative; No dysuria, hematuria, or difficulty voiding Musculoskeletal: Positive for lumbar degenerative disease no myalgias, joint pain, or weakness Hematologic/Oncology: Negative; no easy bruising, bleeding Endocrine: Negative; no heat/cold intolerance; no diabetes Neuro: Episodes of left leg sciatica; no changes in balance, headaches Skin: Negative; No rashes or skin lesions Psychiatric:  Negative; No behavioral problems, depression Sleep: Negative; No snoring, daytime sleepiness, hypersomnolence, bruxism, restless legs, hypnogognic hallucinations, no cataplexy Other comprehensive 14 point system review is negative.  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; no change in hearing, sinus congestion, difficulty swallowing Pulmonary: Negative; No cough, wheezing, shortness of breath, hemoptysis Cardiovascular: Negative; No chest pain, presyncope, syncope, palpitations GI: Negative; No nausea, vomiting, diarrhea, or abdominal pain GU: Negative; No dysuria, hematuria, or difficulty voiding Musculoskeletal: Negative; no myalgias, joint pain, or weakness Hematologic/Oncology: Negative; no easy bruising, bleeding Endocrine: Negative; no heat/cold intolerance; no diabetes Neuro: Negative; no changes in balance, headaches Skin: Negative; No rashes or skin lesions Psychiatric: Negative; No behavioral problems, depression Sleep: Negative; No snoring, daytime sleepiness, hypersomnolence, bruxism, restless legs, hypnogognic hallucinations, no cataplexy Other comprehensive 14 point system review is negative.  PE BP 112/76 mmHg  Pulse 73  Ht 5' 6.5" (1.689 m)  Wt 219 lb 3.2 oz (99.428 kg)  BMI 34.85 kg/m2   Repeat blood pressure by me 108/68.  Wt Readings from Last 3 Encounters:  07/26/15 219 lb 3.2 oz (99.428 kg)  07/14/15 219 lb 12.8 oz (99.701 kg)  07/01/15 215 lb 9 oz (97.779 kg)   General: Alert, oriented, no distress.  Skin: normal turgor, no rashes HEENT: Normocephalic, atraumatic. Pupils round and reactive; sclera anicteric; Fundi with mild increased vessel tortuosity without any nicking hemorrhages or exudates Nose without nasal septal hypertrophy Mouth/Parynx benign; Mallinpatti scale 2 Neck: No JVD, no carotid bruits with normal carotid upstroke Lungs: clear to ausculatation and  percussion; no wheezing or rales Heart: RRR, s1 s2 normal faint 1/6 systolic murmur; no S3 gallop ; no rub thrill or heave Abdomen: Mild obesity with central adiposity;soft, nontender; no hepatosplenomehaly, BS+; abdominal aorta nontender and not dilated by palpation. Pulses 2+ Extremities: no clubbinbg cyanosis or edema, Homan's sign negative  Neurologic: grossly nonfocal; cranial nerves intact Psychological: Normal affect and mood  ECG (independently  read by me): Normal sinus rhythm at 73 bpm.  PR interval 150 ms.  QTc interval 464 ms.  April 2016 ECG (independently read by me): Normal sinus rhythm at 62 bpm.  No ectopy.  Normal intervals.  May 2015 ECG (independently read by me): Normal sinus rhythm.  One isolated PVC.  QTc interval 442 ms.  Prior 09/23/2013 ECG (independently read by me): Normal sinus rhythm at 68 beats per minute. No ectopy. Normal intervals.  Prior ECG of 06/30/2013: Normal sinus rhythm at 89 beats per minute. QTc interval 452 ms. PR interval 148 ms.  LABS:  BMP Latest Ref Rng 07/01/2015 12/14/2014  Glucose 65 - 99 mg/dL 78 90  BUN 7 - 25 mg/dL 13 10  Creatinine 0.50 - 0.99 mg/dL 0.69 0.72  Sodium 135 - 146 mmol/L 139 141  Potassium 3.5 - 5.3 mmol/L 4.2 4.1  Chloride 98 - 110 mmol/L 103 105  CO2 20 - 31 mmol/L 25 25  Calcium 8.6 - 10.4 mg/dL 9.5 8.9    Hepatic Function Latest Ref Rng 07/01/2015 12/14/2014  Total Protein 6.1 - 8.1 g/dL 7.1 6.3  Albumin 3.6 - 5.1 g/dL 4.2 3.9  AST 10 - 35 U/L 24 16  ALT 6 - 29 U/L 28 16  Alk Phosphatase 33 - 130 U/L 77 66  Total Bilirubin 0.2 - 1.2 mg/dL 0.4 0.3    CBC Latest Ref Rng 12/14/2014  WBC 4.0 - 10.5 K/uL 10.4  Hemoglobin 12.0 - 15.0 g/dL 12.8  Hematocrit 36.0 - 46.0 % 39.5  Platelets 150 - 400 K/uL 350   Lab Results  Component Value Date   MCV 89.0 12/14/2014   Lab Results  Component Value Date   TSH 1.327 07/01/2015   No results found for: HGBA1C  BNP No results found for: PROBNP  Lipid Panel       Component Value Date/Time   CHOL 138 12/14/2014 1036   TRIG 130 12/14/2014 1036   HDL 35* 12/14/2014 1036   CHOLHDL 3.9 12/14/2014 1036   VLDL 26 12/14/2014 1036   LDLCALC 77 12/14/2014 1036     RADIOLOGY: Mm Digital Screening  05/09/2013   *RADIOLOGY REPORT*  Clinical Data: Screening.  DIGITAL SCREENING BILATERAL MAMMOGRAM WITH CAD DIGITAL BREAST TOMOSYNTHESIS  Digital breast tomosynthesis images are acquired in two projections.  These images are reviewed in combination with the digital mammogram, confirming the findings below.  Comparison:  11/20/2005  FINDINGS:  ACR Breast Density Category b: There are scattered areas of fibroglandular density.  There is no suspicious dominant mass, architectural distortion, or calcification to suggest malignancy.  Images were processed with CAD.  IMPRESSION: No mammographic evidence of malignancy.  A result letter of this screening mammogram will be mailed directly to the patient.  RECOMMENDATION: Screening mammogram in one year. (Code:SM-B-01Y)  BI-RADS CATEGORY 1:  Negative.   Original Report Authenticated By: Ulyess Blossom, M.D.     ASSESSMENT AND PLAN: Ms. Lindenberger is a 59 year-old female who has a history of hypertension, hyperlipidemia and hypothyroidism. Remotely, coronary angiography revealed normal coronary arteries. She has been without chest pain.  She has a history of palpitations and remotely had been on metoprolol but most recently is doing well with Bystolic with much less fatigability.  Her blood pressure today is somewhat low.  I've suggested she reduce her amlodipine from 5 mg to 2.5 mg daily.  She will continue with her present dose of Bystolic at 10 mg daily.  She is not having any wheezing.  She is on 62.5 g of levothyroxine for her hypothyroidism.  Her most recent TSH was 1.327.  Apparently, she has been taking trade name drug and the insurance company will no longer supply this.  I will write this in a generic form.  Her lipid studies  from April 2016 were again reviewed and her LDL was 77.  We discussed the importance of weight loss with a body mass index of 34.85 kg/m.  We discussed exercise and ideally 5 days per week for 30 minutes at a time.  She will monitor her blood pressure on her reduced dose of amlodipine.  As long as she remains stable, I will see her in one year for reevaluation.  Time spent: 25 minutes  Troy Sine, MD, [facility] 07/26/2015 6:17 PM

## 2015-07-27 ENCOUNTER — Ambulatory Visit: Payer: No Typology Code available for payment source | Attending: Audiology | Admitting: Audiology

## 2015-07-27 ENCOUNTER — Encounter: Payer: Self-pay | Admitting: Family Medicine

## 2015-07-27 DIAGNOSIS — R94128 Abnormal results of other function studies of ear and other special senses: Secondary | ICD-10-CM | POA: Insufficient documentation

## 2015-07-27 DIAGNOSIS — Z8669 Personal history of other diseases of the nervous system and sense organs: Secondary | ICD-10-CM | POA: Diagnosis present

## 2015-07-27 DIAGNOSIS — H9193 Unspecified hearing loss, bilateral: Secondary | ICD-10-CM | POA: Diagnosis present

## 2015-07-27 DIAGNOSIS — H8112 Benign paroxysmal vertigo, left ear: Secondary | ICD-10-CM | POA: Insufficient documentation

## 2015-07-27 DIAGNOSIS — R42 Dizziness and giddiness: Secondary | ICD-10-CM | POA: Insufficient documentation

## 2015-07-27 DIAGNOSIS — Z01118 Encounter for examination of ears and hearing with other abnormal findings: Secondary | ICD-10-CM | POA: Diagnosis present

## 2015-07-27 DIAGNOSIS — R269 Unspecified abnormalities of gait and mobility: Secondary | ICD-10-CM | POA: Diagnosis present

## 2015-07-27 DIAGNOSIS — H93213 Auditory recruitment, bilateral: Secondary | ICD-10-CM | POA: Diagnosis present

## 2015-07-27 DIAGNOSIS — Z87898 Personal history of other specified conditions: Secondary | ICD-10-CM

## 2015-07-27 HISTORY — DX: Unspecified hearing loss, bilateral: H91.93

## 2015-07-27 NOTE — Procedures (Signed)
[facility]  [place], [place] [address]  [phone]   Audiological Evaluation  Patient Name: Molly Giles   Status: Outpatient   DOB: 1956-03-24    Diagnosis:Hearing Loss, Vertigo/Dizziness MRN: [id number] Date:  07/27/2015     Referent: Dr. Ernesto Rutherford, ENT  History: SHALVA SWEIGERT was seen for an audiological evaluation during which she reported having a migraine, but she wanted to continue with the test. Gordy Savers reports a severe head injury that resulted in a "concussion" in August 2016.  She reports a fall from 3-4 feet where she hit her forehead directly, blackening both eyes.  She has noticed increased difficulty with "loud noise and lots of talking in the background". She also notices difficulty "hearing others when she has a migraine".  Gypsy Lore Almendariz noticed the left ear has a feeling of "pressure or fullness when an airplane is landing".    Gypsy Lore Rissler states that she just "finished 6 BPPV treatment sessions" which seemed to have stopped the vertigo and she was given a home program to continue with.  However she has recently started having some BPPV reoccurrence and plans to contact her PT as well as Dr. Ernesto Rutherford, ENT. Gypsy Lore Tester states that the vertigo "spins to the right" and she "vers to the right when walking".  There is no family history of hearing loss.   Evaluation: Conventional pure tone audiometry from 250Hz  - 8000Hz  with using insert earphones. Hearing thresholds are symmetrical and are 35-40 dBHL at 250Hz  and 5-20 dBHL from 500Hz  - 8000Hz  except for a 30 dBHL hearing threshold on the left side at 3000Hz . There is a conductive component at 250Hz  bilaterally and 3000Hz  on the left side. Reliability is good Speech reception levels (repeating words near threshold) using recorded spondee word lists:  Right ear: 15 dBHL.  Left ear:  10 dBHL Word recognition (at comfortably loud volumes) using recorded NU-6 word lists at 50  dBHL, in quiet.  Right ear: 100%.  Left ear:   100% Word recognition in minimal background noise:  +5 dBHL  Right ear: 86%                              Left ear:  82%  Tympanometry (middle ear function) with ipsilateral acoustic reflexes at 1000Hz  (additional testing was not completed because of the migraine and reports of sound sensitivity).  Right ear: Normal (Type A) with present acoustic reflex at 1000Hz .  Left ear: Normal (Type A) with present acoustic reflex at 1000Hz ..  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 Gypsy Lore Lepp reports uncomfortable loudness levels of 85-95 dB using monitored live voice which suggests some sound sensitivity or loudness recruitment- however, GAYA BANKES had a migraine while testing today which she reports sometimes contributes to sound sensitivity.    Word recognition is excellent in each ear in quiet at conversational speech levels bilaterally. In minimal background noise, word recognition drops slightly but remains very good in the right ear and good in the left ear.  Gypsy Lore Jetter reported that hearing in background noise was much more challenging on the left side.     RECOMMENDATIONS: 1.   Follow-up with Dr. Ernesto Rutherford, ENT. 2.  Follow-up with the PT therapist treating the BPPV. 3.   Monitor hearing with a repeat audiological evaluation in 3-6 months (earlier if there is any change in hearing or ear pressure) to monitor  the low frequency hearing loss and left ear hearing loss at 3000Hz .  4.  Strategies that help improve hearing include: A) Face the speaker directly. Optimal is having the speakers face well - lit.  Unless amplified, being within 3-6 feet of the speaker will enhance word recognition. B) Avoid having the speaker back-lit as this will minimize the ability to use cues from lip-reading,  facial expression and gestures. C)  Word recognition is poorer in background noise. For optimal word recognition, turn off the TV, radio or noisy fan when engaging in conversation. In a restaurant, try to sit away from noise sources and close to the primary speaker.  D)  Ask for topic clarification from time to time in order to remain in the conversation.  Most people don't mind repeating or clarifying a point when asked.  If needed, explain the difficulty hearing in background noise or hearing loss. 5.   Use hearing protection during noisy activities such as using a weed eater, moving the lawn, shooting, etc.    Musician's plugs, are available from [employer] for music related hearing protection because there is no distortion.  Other hearing protection, such as sponge plugs (available at pharmacies) or earmuffs (available at sporting goods stores or department stores such as Paediatric nurse) are useful for noisy activities and venues.  Jontay Maston L. Heide Spark, Au.D., CCC-A Doctor of Audiology 07/27/2015  cc: Acquanetta Sit, MD

## 2015-07-28 ENCOUNTER — Encounter: Payer: No Typology Code available for payment source | Admitting: Rehabilitative and Restorative Service Providers"

## 2015-08-02 ENCOUNTER — Other Ambulatory Visit: Payer: Self-pay | Admitting: Neurology

## 2015-08-02 DIAGNOSIS — R42 Dizziness and giddiness: Secondary | ICD-10-CM

## 2015-08-03 ENCOUNTER — Ambulatory Visit: Payer: No Typology Code available for payment source | Admitting: Rehabilitative and Restorative Service Providers"

## 2015-08-03 ENCOUNTER — Encounter: Payer: No Typology Code available for payment source | Admitting: Rehabilitative and Restorative Service Providers"

## 2015-08-03 DIAGNOSIS — Z8669 Personal history of other diseases of the nervous system and sense organs: Secondary | ICD-10-CM | POA: Diagnosis not present

## 2015-08-03 DIAGNOSIS — R269 Unspecified abnormalities of gait and mobility: Secondary | ICD-10-CM

## 2015-08-03 DIAGNOSIS — H8112 Benign paroxysmal vertigo, left ear: Secondary | ICD-10-CM

## 2015-08-03 NOTE — Therapy (Signed)
[facility] [address] [place], [place], [address] Phone: [phone]   Fax:  [phone]  Physical Therapy Re-Evaluation  Patient Details  Name: Molly Giles MRN: [id number] Date of Birth: 26-Feb-1956 No Data Recorded  Encounter Date: 08/03/2015      PT End of Session - 08/03/15 1426    Visit Number 1   Number of Visits 5   Date for PT Re-Evaluation 09/11/15   Authorization Type private insurance   PT Start Time 1234   PT Stop Time 1320   PT Time Calculation (min) 46 min   Activity Tolerance Patient tolerated treatment well   Behavior During Therapy [facility] for tasks assessed/performed      Past Medical History  Diagnosis Date  . Mood disorder ([place])   . Fibromyalgia   . Hypertension 2004  . Hyperlipidemia 1998  . Migraine 1962    Chronic Migraines  . Hypothyroidism   . PONV (postoperative nausea and vomiting)   . Family history of adverse reaction to anesthesia     Brother- N/V  . Dysrhythmia     seen by dr Wynonia Lawman- not a problem since she has been on Bystolic  . Shortness of breath dyspnea     with exertion  . Depression   . Arthritis   . Interstitial cystitis   . History of Clostridium difficile colitis 07/01/2015    Required Fecal Transplantation tocure  . Normal coronary arteries 05/14/2014  . Family history of premature CAD 05/15/2013  . H/O seasonal allergies   . Palpitations 05/15/2013    [facility] manages  . GERD (gastroesophageal reflux disease) 12/16/2014  . Hx of bad fall 02/2015    Severe Facial/head trauma without fracture  . Impairment of balance 02/2015    Consequent of postconcussive syndrome  . Benign positional vertigo 04/2015    Responded well to Vestibular Rehab  . Bruxism (teeth grinding)   . Degenerative cervical disc   . Overweight   . Thyroid nodule 08/11/2009    Findings: The thyroid gland is within normal limits in size.  The gland is diffusely inhomogeneous. A small  solid nodule is noted in the lower pole  medially on the right of 7 x 6 x 8 mm. A small solid nodule is noted inferiorly on the left of 3 x 3 x 4 mm.  IMPRESSION:  The thyroid gland is within normal limits in size with only small solid nodules present, the largest of only 8 mm in diameter on the right.      Past Surgical History  Procedure Laterality Date  . Carpometacarpal joint arthrotomy Right 2011  . Cardiovascular stress test  2000    Unremarkable per pt report  . Breast biopsy  2011    Benign histology  . Colonoscopy    . Bladder dilitation      x 3  . Colonoscopy with propofol N/A 04/21/2015    Procedure: COLONOSCOPY WITH PROPOFOL;  Surgeon: Carol Ada, MD;  Location: [place];  Service: Endoscopy;  Laterality: N/A;  . Fecal transplant  04/21/2015    Procedure: FECAL TRANSPLANT;  Surgeon: Carol Ada, MD;  Location: [facility];  Service: Endoscopy;;    There were no vitals filed for this visit.  Visit Diagnosis:  Abnormality of gait  BPPV (benign paroxysmal positional vertigo), left      Subjective Assessment - 08/03/15 1241    Subjective About one week after ending PT on 07/12/2015, the patient experienced episode of room spinning-not  as severe as before.  The spinning sensations are always present in the morning and last for seconds.  She was seen by Dr. Ernesto Rutherford yesterday and he feels that her symptoms could be consistent with meniere's disease.    Patient Stated Goals reduce dizziness, improve concentration   Currently in Pain? Yes   Pain Score --  "it's coming on"   Pain Location Head   Pain Orientation Posterior   Pain Descriptors / Indicators --  reports tightness in her neck            [facility] PT Assessment - 08/03/15 1246    Assessment   Medical Diagnosis post concussion vertigo/BPPV history   Onset Date/Surgical Date --  07/2014   Prior Therapy known to our clinic from recent post-concussive and BPPV treatment   Balance Screen   Has the patient  fallen in the past 6 months Yes   How many times? 1  sustaining concussion   Has the patient had a decrease in activity level because of a fear of falling?  No   Is the patient reluctant to leave their home because of a fear of falling?  No   Home Ecologist residence   Prior Function   Level of Independence Independent   Vocation --  Editor, commissioning   Behaviors --  reports memory is improving   Observation/Other Assessments   Focus on Therapeutic Outcomes (FOTO)  not performed            Vestibular Assessment - 08/03/15 1246    Vestibular Assessment   General Observation patient reports symptoms are much more mild than last itme   Symptom Behavior   Type of Dizziness Spinning   Frequency of Dizziness daily   Duration of Dizziness seconds   Aggravating Factors Mornings  when waking up, occasionally with head turns   Relieving Factors Head stationary   Occulomotor Exam   Occulomotor Alignment Normal   Positional Testing   Dix-Hallpike Dix-Hallpike Right;Dix-Hallpike Left   Sidelying Test Sidelying Right;Sidelying Left   Dix-Hallpike Right   Dix-Hallpike Right Duration seconds   Dix-Hallpike Right Symptoms Upbeat, right rotatory nystagmus  nystagmus appear to settle and then restart-observed in room light   Dix-Hallpike Left   Dix-Hallpike Left Duration none   Dix-Hallpike Left Symptoms No nystagmus   Sidelying Right   Sidelying Right Duration mild sense symptoms could begin, but no nystagmus viewed   Sidelying Right Symptoms No nystagmus   Sidelying Left   Sidelying Left Duration none   Sidelying Left Symptoms No nystagmus           Vestibular Treatment/Exercise - 08/03/15 1258    Vestibular Treatment/Exercise   Vestibular Treatment Provided Canalith Repositioning;Habituation   Canalith Repositioning Epley Manuever Right   Habituation Exercises Brandt Daroff;Horizontal Roll    EPLEY MANUEVER RIGHT    Number of Reps  2   Overall Response Improved Symptoms   Response Details  --  initially has nausea at end of maneuver   [employer]   Number of Reps  2   Symptom Description  no nystagmus with brandt daroff, only "lightheaded" sensation when patient returns to sitting position   X1 Viewing Horizontal   Foot Position standing   Comments reviewed from prior HEP           PT Education - 08/03/15 1426    Education provided Yes   Education Details HEP: reviewed prior HEP of brandt daroff, rolling, and  gaze   Person(s) Educated Patient   Methods Explanation;Demonstration;Handout   Comprehension Verbalized understanding;Returned demonstration            PT Long Term Goals - 08/03/15 1428    PT LONG TERM GOAL #1   Title The patient will return demo HEP for habituation, gaze.   Baseline Target date 09/11/2015   Time 4   Period Weeks   PT LONG TERM GOAL #2   Title The patient will have negative positional testing for nystagmus and dizziness.   Baseline Target date 09/11/2015   Time 4   Period Weeks   PT LONG TERM GOAL #3   Title The patient will verbalize understanding of future mgmt of BPPV prior to d/c.   Baseline Target date 09/11/2015   Time 4   Period Weeks            Plan - 08/03/15 1449    Clinical Impression Statement The patient is a 59 yo female presenting for re-evaluation of recurrent vertigo.  Patient's initial plan of care ended due to resolution of symptoms, however symptoms returned after vertigo resolved.  She presents today with mild R BPPV.  She responded well to Epley's maneuver, however trace nystagmus still noted.  Provided habituatin for HEP.   Pt will benefit from skilled therapeutic intervention in order to improve on the following deficits Abnormal gait;Decreased activity tolerance;Decreased balance;Decreased mobility;Dizziness;Pain   Rehab Potential Good   PT Frequency 1x / week   PT Duration 4 weeks   PT Treatment/Interventions  Therapeutic exercise;Therapeutic activities;Functional mobility training;Balance training;Neuromuscular re-education;Gait training;Patient/family education;Vestibular;Canalith Repostioning;Manual techniques   PT Next Visit Plan Check BPPV, check HEP   Consulted and Agree with Plan of Care Patient         Problem List Patient Active Problem List   Diagnosis Date Noted  . Hearing loss of both ears 07/27/2015  . Chronic migraine without aura, with intractable migraine, so stated, with status migrainosus 07/14/2015  . Post concussive syndrome 07/14/2015  . Musculoskeletal neck pain 07/14/2015  . Depression (NOS) 07/02/2015  . Allergic rhinoconjunctivitis 07/02/2015  . Fibromyalgia 07/01/2015  . History of Clostridium difficile colitis, persistent 07/01/2015  . Post concussion syndrome 06/06/2015  . Impairment of balance 02/10/2015  . Essential hypertension 05/15/2013  . Hypothyroidism 05/15/2013  . Hyperlipidemia 05/15/2013  . Palpitations 05/15/2013  . Interstitial cystitis 05/15/2013  . History of migraine headaches 05/15/2013  . 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 Rudell Cobb, MPT   [place], PT 08/03/2015, 3:12 PM  [facility] [address] [place], [place], [address] Phone: [phone]   Fax:  [phone]  Name: Molly Giles MRN: [id number] Date of Birth: [birth year]

## 2015-08-03 NOTE — Patient Instructions (Addendum)
Tip Card 1.The goal of habituation training is to assist in decreasing symptoms of vertigo, dizziness, or nausea provoked by specific head and body motions. 2.These exercises may initially increase symptoms; however, be persistent and work through symptoms. With repetition and time, the exercises will assist in reducing or eliminating symptoms. 3.Exercises should be stopped and discussed with the therapist if you experience any of the following: - Sudden change or fluctuation in hearing - New onset of ringing in the ears, or increase in current intensity - Any fluid discharge from the ear - Severe pain in neck or back - Extreme nausea  Copyright  VHI. All rights reserved.  Rolling   With pillow under head, start on back. Roll to your right side. Hold until dizziness stops, plus 20 seconds and then roll to the left side. Hold until dizziness stops, plus 20 seconds. Repeat sequence 5 times per session. Do 2 sessions per day.  Copyright  VHI. All rights reserved.  Sit to Side-Lying   Sit on edge of bed. Lie down onto the right side and hold until dizziness stops, plus 20 seconds. Return to sitting and wait until dizziness stops, plus 20 seconds. Repeat to the left side. Repeat sequence 5 times per session. Do 2 sessions per day.  Copyright  VHI. All rights reserved.  Gaze Stabilization: Tip Card 1.Target must remain in focus, not blurry, and appear stationary while head is in motion. 2.Perform exercises with small head movements (45 to either side of midline). 3.Increase speed of head motion so long as target is in focus. 4.If you wear eyeglasses, be sure you can see target through lens (therapist will give specific instructions for bifocal / progressive lenses). 5.These exercises may provoke dizziness or nausea. Work through these symptoms. If too dizzy, slow head movement slightly. Rest between each exercise. 6.Exercises demand concentration; avoid distractions. 7.For safety,  perform standing exercises close to a counter, wall, corner, or next to someone.  Copyright  VHI. All rights reserved.  Gaze Stabilization: Standing Feet Apart   Feet shoulder width apart, keeping eyes on target on wall 3 feet away, tilt head down slightly and move head side to side for 30 seconds. Repeat while moving head up and down for 30 seconds. (may start with 10-15 seconds and work up to 30 seconds) Do 2-3 sessions per day.   Copyright  VHI. All rights reserved.   Special Instructions: Exercises may bring on mild to moderate (4-5/10 at most) symptoms of dizziness, swimmyheadedness that resolve within 30 minutes of completing exercises. If symptoms are lasting longer than 30 minutes, modify your exercises by: >decreasing the # of times you complete each activity >ensuring your symptoms return to baseline before moving onto the next exercise >dividing up exercises so you do not do them all in one session, but multiple short sessions throughout the day >doing them once a day until symptoms improve  JOURNALING: *how long symptoms last?  (seconds, minutes, hours, days) *was it provoked by movement? *Associated with headache/migraine symptoms? *Associations with salty food intake? *Any hearing fluctuations or changes (hearing loss, roaring in the ears, ringing in the ears)?

## 2015-08-04 ENCOUNTER — Other Ambulatory Visit: Payer: Self-pay | Admitting: Neurology

## 2015-08-04 DIAGNOSIS — R42 Dizziness and giddiness: Secondary | ICD-10-CM

## 2015-08-11 ENCOUNTER — Ambulatory Visit: Payer: No Typology Code available for payment source | Admitting: Rehabilitative and Restorative Service Providers"

## 2015-08-11 ENCOUNTER — Telehealth: Payer: Self-pay | Admitting: Neurology

## 2015-08-11 DIAGNOSIS — R42 Dizziness and giddiness: Secondary | ICD-10-CM

## 2015-08-11 DIAGNOSIS — Z8669 Personal history of other diseases of the nervous system and sense organs: Secondary | ICD-10-CM | POA: Diagnosis not present

## 2015-08-11 DIAGNOSIS — R269 Unspecified abnormalities of gait and mobility: Secondary | ICD-10-CM

## 2015-08-11 NOTE — Patient Instructions (Addendum)
Continue current rolling and balance HEP. Hold on sit<>sidelying at this time. Perform gaze x 1 viewing exercise working up to 60 seconds at a slower pace.

## 2015-08-11 NOTE — Telephone Encounter (Signed)
Called patient regarding botox, received not answer. Left a VM asking her to return my call.

## 2015-08-11 NOTE — Therapy (Signed)
[facility] [address] [place], [place], [address] Phone: [phone]   Fax:  [phone]  Physical Therapy Treatment  Patient Details  Name: Molly Giles MRN: [id number] Date of Birth: [birth year] No Data Recorded  Encounter Date: 08/11/2015      PT End of Session - 08/11/15 1352    Visit Number 2   Number of Visits 5   Date for PT Re-Evaluation 09/11/15   Authorization Type private insurance *needs authorization after 12/7 to return to PT    PT Start Time 1240   PT Stop Time 1330   PT Time Calculation (min) 50 min   Activity Tolerance Patient tolerated treatment well   Behavior During Therapy [facility] for tasks assessed/performed      Past Medical History  Diagnosis Date  . Mood disorder ([place])   . Fibromyalgia   . Hypertension 2004  . Hyperlipidemia 1998  . Migraine 1962    Chronic Migraines  . Hypothyroidism   . PONV (postoperative nausea and vomiting)   . Family history of adverse reaction to anesthesia     Brother- N/V  . Dysrhythmia     seen by dr Wynonia Lawman- not a problem since she has been on Bystolic  . Shortness of breath dyspnea     with exertion  . Depression   . Arthritis   . Interstitial cystitis   . History of Clostridium difficile colitis 07/01/2015    Required Fecal Transplantation tocure  . Normal coronary arteries 05/14/2014  . Family history of premature CAD 05/15/2013  . H/O seasonal allergies   . Palpitations 05/15/2013    [facility] manages  . GERD (gastroesophageal reflux disease) 12/16/2014  . Hx of bad fall 02/2015    Severe Facial/head trauma without fracture  . Impairment of balance 02/2015    Consequent of postconcussive syndrome  . Benign positional vertigo 04/2015    Responded well to Vestibular Rehab  . Bruxism (teeth grinding)   . Degenerative cervical disc   . Overweight   . Thyroid nodule 08/11/2009    Findings: The thyroid gland is within normal limits in size.  The  gland is diffusely inhomogeneous. A small solid nodule is noted in the lower pole  medially on the right of 7 x 6 x 8 mm. A small solid nodule is noted inferiorly on the left of 3 x 3 x 4 mm.  IMPRESSION:  The thyroid gland is within normal limits in size with only small solid nodules present, the largest of only 8 mm in diameter on the right.      Past Surgical History  Procedure Laterality Date  . Carpometacarpal joint arthrotomy Right 2011  . Cardiovascular stress test  2000    Unremarkable per pt report  . Breast biopsy  2011    Benign histology  . Colonoscopy    . Bladder dilitation      x 3  . Colonoscopy with propofol N/A 04/21/2015    Procedure: COLONOSCOPY WITH PROPOFOL;  Surgeon: Carol Ada, MD;  Location: [facility];  Service: Endoscopy;  Laterality: N/A;  . Fecal transplant  04/21/2015    Procedure: FECAL TRANSPLANT;  Surgeon: Carol Ada, MD;  Location: [facility];  Service: Endoscopy;;    There were no vitals filed for this visit.  Visit Diagnosis:  Abnormality of gait  Dizziness and giddiness      Subjective Assessment - 08/11/15 1241    Subjective The patient reports that she still notes dizziness when  moving quickly or bending to look under things.  HEP rolling provokes dizziness, but not sit<>bilateral sidelying.  She arrives with headache today 4x/week.  The patient reports  lightheaded sensation more than true spinning.   Patient Stated Goals reduce dizziness, improve concentration   Currently in Pain? Yes   Pain Score --  moderate "diffuse" type pain, not intense   Pain Location Head   Pain Descriptors / Indicators Dull;Aching   Pain Type Chronic pain   Pain Onset Today   Pain Frequency Intermittent      NEUROMUSCULAR RE-EDUCATION:      Vestibular Treatment/Exercise - 08/11/15 1247    Vestibular Treatment/Exercise   Vestibular Treatment Provided Habituation;Gaze   Habituation Exercises Laruth Bouchard Daroff;Horizontal Roll   Gaze Exercises X1 Viewing  Horizontal   Nestor Lewandowsky   Number of Reps  3   Symptom Description  "lightheaded" sensation with return to sitting. worse when coming up from the right side with accompanying nausea.  Pt rests in between repetitions.   Horizontal Roll   Number of Reps  5   Symptom Description  Patient has a headache at baseline today. 0/10 nausea, 0/10 dizziness at baseline  Rolling to the left provokes greater sensation of nausea to moderate level.  Rolling to the right provokes mild sensation of dizziness.  Symptoms improve with repetition.   X1 Viewing Horizontal   Foot Position seated   Reps --   28 seconds   Comments recommended continue working towards 60 seconds at slower pace.     Patient experiences a lightheaded sensation with return to sitting that is different than prior clinic presentation in October.  She does continue to have mild sensation of room movement, not described as spinning with horizontal roll onto the left side.  Her bilateral sidelying testing was negative today, however significant "lightheadedness" with each return to sitting.  Orthostatic BP measurements:  Supine after 5 minutes BP=132/90, HR=75  Standing after 1 minute BP measurement not obtained due to inaudible with stethescope +manual cuff x 2 attempts, HR=83  Automatic cuff for BP in standing after 3 minutes=44/29 (?patient only reporting mild lightheaded sensation, therefore assume inaccurate reading) Had patient return to sitting x 2 minutes automatic cuff BP=118/89 Standing after 1 minute automatic cuff BP=114/71   SELF CARE/HOME MANAGEMENT: Educated patient on modifications to HEP (recommending waiting on brandt daroff habituation) Education re: postural hypotension and recommended patient f/u with MD re: recent medication changes and worsening sensations of lightheadedness Discussed obtaining home BP cuff and journaling BP measurements 1-2x/day to obtain readings        PT Education - 08/11/15 1356     Education provided Yes   Education Details handout from CDC on postural hypotension, HEP recommendations (hold sit<>sidelying due to BP dropping, continue rolling  and gaze x 1 viewing)   Person(s) Educated Patient   Methods Explanation;Demonstration   Comprehension Verbalized understanding          PT Short Term Goals - 07/06/15 1201    PT SHORT TERM GOAL #1   Title The patient will be independent with HEP for habituation, gaze x 1 adaptation, and general mobility.   Baseline Met on 06/25/2015   Status Achieved   PT SHORT TERM GOAL #2   Title The patient will perform supine<>bilateral rolling with no subjective c/o spinning sensation.   Baseline feels a sensation that it could come on, but does not spin 07/06/2015.     Time 4   Period Weeks   Status  Achieved   PT SHORT TERM GOAL #3   Title The patient will tolerate gaze x 1 adaptation x 20 seconds at self selected speed for improved movement tolerance.   Baseline Pt tolerates gaze x 1 x 30 seconds with dull HA aggravated during activity.    Time 4   Status Achieved   PT SHORT TERM GOAL #4   Title The patient will be further assessed on DGI and SOT and goal to follow, if indicated.   Baseline Met with 67% on SOT compared to 70% for norms.   Time 4   Status Achieved           PT Long Term Goals - 08/03/15 1428    PT LONG TERM GOAL #1   Title The patient will return demo HEP for habituation, gaze.   Baseline Target date 09/11/2015   Time 4   Period Weeks   PT LONG TERM GOAL #2   Title The patient will have negative positional testing for nystagmus and dizziness.   Baseline Target date 09/11/2015   Time 4   Period Weeks   PT LONG TERM GOAL #3   Title The patient will verbalize understanding of future mgmt of BPPV prior to d/c.   Baseline Target date 09/11/2015   Time 4   Period Weeks               Plan - 08/11/15 1357    Clinical Impression Statement At today's treatment, the patient did not have nystagmus  viewed in room light and had more of a "lightheaded" sensation worse with L rolling and most aggravated by return to sitting from sidelying positions with habituation.  PT checked patient's BP in therapy and she has readings consistent with postural hypotension-- PT to send note to MD (primary care and cardiologist) to alert them to measurements.  PT to hold at this time awaiting further assessment if indicated.   PT Next Visit Plan hold until f/u with MD   Consulted and Agree with Plan of Care Patient        Problem List Patient Active Problem List   Diagnosis Date Noted  . Hearing loss of both ears 07/27/2015  . Chronic migraine without aura, with intractable migraine, so stated, with status migrainosus 07/14/2015  . Post concussive syndrome 07/14/2015  . Musculoskeletal neck pain 07/14/2015  . Depression (NOS) 07/02/2015  . Allergic rhinoconjunctivitis 07/02/2015  . Fibromyalgia 07/01/2015  . History of Clostridium difficile colitis, persistent 07/01/2015  . Post concussion syndrome 06/06/2015  . Impairment of balance 02/10/2015  . Essential hypertension 05/15/2013  . Hypothyroidism 05/15/2013  . Hyperlipidemia 05/15/2013  . Palpitations 05/15/2013  . Interstitial cystitis 05/15/2013  . History of migraine headaches 05/15/2013  . 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

## 2015-08-16 ENCOUNTER — Ambulatory Visit
Admission: RE | Admit: 2015-08-16 | Discharge: 2015-08-16 | Disposition: A | Discharge status: Home / Self Care | Payer: No Typology Code available for payment source | Source: Ambulatory Visit

## 2015-08-16 DIAGNOSIS — Z1231 Encounter for screening mammogram for malignant neoplasm of breast: Secondary | ICD-10-CM

## 2015-08-20 ENCOUNTER — Encounter: Payer: No Typology Code available for payment source | Admitting: Rehabilitative and Restorative Service Providers"

## 2015-08-20 ENCOUNTER — Other Ambulatory Visit: Payer: Self-pay | Admitting: Cardiovascular Disease

## 2015-08-31 ENCOUNTER — Telehealth: Payer: Self-pay | Admitting: Neurology

## 2015-08-31 NOTE — Telephone Encounter (Signed)
Called and spoke with [facility] and they relayed I needed to fax notes to [phone]. For auth for Botox.

## 2015-09-01 ENCOUNTER — Telehealth: Payer: Self-pay | Admitting: Neurology

## 2015-09-01 ENCOUNTER — Telehealth: Payer: Self-pay | Admitting: Rehabilitative and Restorative Service Providers"

## 2015-09-01 NOTE — Telephone Encounter (Signed)
Called and left patient a message time two about calling [facility] to give consent to have her Botox shipped. [facility] [phone] . Telephone number patient needs to call.

## 2015-09-01 NOTE — Telephone Encounter (Signed)
The patient reports she did not follow-up with MDs re: blood pressure changes.  She is still getting intermittent dizziness worse with head turns.  She is not doing HEP regularly due to the holiday. PT recommends the patient work on home exercises and monitor BP (she did not get a cuff yet)--f/u with MD if indicated.

## 2015-09-09 ENCOUNTER — Ambulatory Visit: Payer: No Typology Code available for payment source | Admitting: Neurology

## 2015-09-14 ENCOUNTER — Encounter: Payer: Self-pay | Admitting: Neurology

## 2015-09-15 NOTE — Telephone Encounter (Signed)
Pt called, needs to r/s appt [phone].

## 2015-09-16 NOTE — Telephone Encounter (Signed)
Returned patients call to schedule apt. No answer, left a VM asking her to return my call.

## 2015-09-17 ENCOUNTER — Encounter: Payer: Self-pay | Admitting: Family Medicine

## 2015-10-04 ENCOUNTER — Ambulatory Visit (INDEPENDENT_AMBULATORY_CARE_PROVIDER_SITE_OTHER): Payer: No Typology Code available for payment source | Admitting: Neurology

## 2015-10-04 VITALS — BP 123/76 | HR 86

## 2015-10-04 DIAGNOSIS — G43711 Chronic migraine without aura, intractable, with status migrainosus: Secondary | ICD-10-CM | POA: Diagnosis not present

## 2015-10-04 NOTE — Progress Notes (Signed)
History: Here for her first botox migraine injections. She has chronic migraines for years. Has seen multiple neurologists. She is having stiffness in the cervical muscles. Most Headaches are migrainous, others are more pressure like. Her left side really aches. She has tried everything for migraines in the past. She has the headaches 5x a week, at least 15 are migrainous a month for 3-4 years. Punding, throbbing, light and noise sensitivity, vice on the right side of the head, no aura. No overuse medication headache. Migraines last for 24 hours, some are treatable with relpax but others are not and they migraines always come back. Botox for the migraines. Start Amantadine for cognitive. Tried and failed Topamax, Propranolol/nebivolol, celexa and multiple other migraine medications in the past, has had chronic intractable migraines for years.    Consent Form Botulism Toxin Injection For Chronic Migraine  Botulism toxin has been approved by the Federal drug administration for treatment of chronic migraine. Botulism toxin does not cure chronic migraine and it may not be effective in some patients.  The administration of botulism toxin is accomplished by injecting a small amount of toxin into the muscles of the neck and head. Dosage must be titrated for each individual. Any benefits resulting from botulism toxin tend to wear off after 3 months with a repeat injection required if benefit is to be maintained. Injections are usually done every 3-4 months with maximum effect peak achieved by about 2 or 3 weeks. Botulism toxin is expensive and you should be sure of what costs you will incur resulting from the injection.  The side effects of botulism toxin use for chronic migraine may include:   -Transient, and usually mild, facial weakness with facial injections  -Transient, and usually mild, head or neck weakness with head/neck injections  -Reduction or loss of forehead facial animation due to forehead  muscle              weakness  -Eyelid drooping  -Dry eye  -Pain at the site of injection or bruising at the site of injection  -Double vision  -Potential unknown long term risks  Contraindications: You should not have Botox if you are pregnant, nursing, allergic to albumin, have an infection, skin condition, or muscle weakness at the site of the injection, or have myasthenia gravis, Lambert-Eaton syndrome, or ALS.  It is also possible that as with any injection, there may be an allergic reaction or no effect from the medication. Reduced effectiveness after repeated injections is sometimes seen and rarely infection at the injection site may occur. All care will be taken to prevent these side effects. If therapy is given over a long time, atrophy and wasting in the muscle injected may occur. Occasionally the patient's become refractory to treatment because they develop antibodies to the toxin. In this event, therapy needs to be modified.  I have read the above information and consent to the administration of botulism toxin.  On file  ______________  _____   _________________  Patient signature     Date   Witness signature       BOTOX PROCEDURE NOTE FOR MIGRAINE HEADACHE    Contraindications and precautions discussed with patient(above). Aseptic procedure was observed and patient tolerated procedure. Procedure performed by Dr. Georgia Dom  The condition has existed for more than 6 months, and pt does not have a diagnosis of ALS, Myasthenia Gravis or Lambert-Eaton Syndrome. Risks and benefits of injections discussed and pt agrees to proceed with the procedure. Written consent  obtained  These injections are medically necessary. She receives good benefits from these injections. These injections do not cause sedations or hallucinations which the oral therapies may cause.  Indication/Diagnosis: chronic migraine BOTOX(J0585) injection was performed according to protocol by [employer]. 200  units of BOTOX was dissolved into 4 cc NS.  NDC: [id number]  Type of toxin: Botox  Lot # [id number] EXP: 02/2018   Description of procedure:  The patient was placed in a sitting position. The standard protocol was used for Botox as follows, with 5 units of Botox injected at each site:   -Procerus muscle, midline injection  -Corrugator muscle, bilateral injection  -Frontalis muscle, bilateral injection, with 2 sites each side, medial injection was performed in the upper one third of the frontalis muscle, in the region vertical from the medial inferior edge of the superior orbital rim. The lateral injection was again in the upper one third of the forehead vertically above the lateral limbus of the cornea, 1.5 cm lateral to the medial injection site.  -Temporalis muscle injection, 4 sites, bilaterally. The first injection was 3 cm above the tragus of the ear, second injection site was 1.5 cm to 3 cm up from the first injection site in line with the tragus of the ear. The third injection site was 1.5-3 cm forward between the first 2 injection sites. The fourth injection site was 1.5 cm posterior to the second injection site.  -Occipitalis muscle injection, 3 sites, bilaterally. The first injection was done one half way between the occipital protuberance and the tip of the mastoid process behind the ear. The second injection site was done lateral and superior to the first, 1 fingerbreadth from the first injection. The third injection site was 1 fingerbreadth superiorly and medially from the first injection site.  -Cervical paraspinal muscle injection, 2 sites, bilateral knee first injection site was 1 cm from the midline of the cervical spine, 3 cm inferior to the lower border of the occipital protuberance. The second injection site was 1.5 cm superiorly and laterally to the first injection site.  -Trapezius muscle injection was performed at 3 sites, bilaterally. The first injection site was in  the upper trapezius muscle halfway between the inflection point of the neck, and the acromion. The second injection site was one half way between the acromion and the first injection site. The third injection was done between the first injection site and the inflection point of the neck.   Will return for repeat injection in 3 months.    200 units of Botox was used, 155 units were injected, the rest of the Botox was wasted. The patient tolerated the procedure well, there were no complications of the above procedure.

## 2015-10-04 NOTE — Addendum Note (Signed)
Addended by: Sarina Ill B on: 10/04/2015 12:06 PM   Modules accepted: Orders, Medications

## 2015-10-05 ENCOUNTER — Ambulatory Visit: Payer: No Typology Code available for payment source | Admitting: Neurology

## 2015-10-07 ENCOUNTER — Telehealth: Payer: Self-pay | Admitting: Neurology

## 2015-10-07 NOTE — Telephone Encounter (Signed)
Pt returned call. She was told she would have a call back. Thank you

## 2015-10-07 NOTE — Telephone Encounter (Signed)
Called patient to schedule next injection. She did not answer, left her a VM asking her to call back.

## 2015-10-08 NOTE — Telephone Encounter (Signed)
Called patient and scheduled apt.

## 2015-10-27 ENCOUNTER — Other Ambulatory Visit: Payer: Self-pay | Admitting: Cardiovascular Disease

## 2015-10-27 NOTE — Telephone Encounter (Signed)
Rx(s) sent to pharmacy electronically.  

## 2015-10-28 ENCOUNTER — Encounter: Payer: Self-pay | Admitting: Rehabilitative and Restorative Service Providers"

## 2015-10-28 NOTE — Therapy (Signed)
[facility] [address] [place], [place], [address] Phone: [phone]   Fax:  [phone]  Patient Details  Name: Molly Giles MRN: [id number] Date of Birth: [birth year] Referring Provider:  No ref. provider found  Encounter Date: last encounter 08/11/2015  PHYSICAL THERAPY DISCHARGE SUMMARY  Visits from Start of Care: 8  Current functional level related to goals / functional outcomes:     PT Long Term Goals - 08/03/15 1428    PT LONG TERM GOAL #1   Title The patient will return demo HEP for habituation, gaze.   Baseline Target date 09/11/2015   Time 4   Period Weeks   PT LONG TERM GOAL #2   Title The patient will have negative positional testing for nystagmus and dizziness.   Baseline Target date 09/11/2015   Time 4   Period Weeks   PT LONG TERM GOAL #3   Title The patient will verbalize understanding of future mgmt of BPPV prior to d/c.   Baseline Target date 09/11/2015   Time 4   Period Weeks     *PT held due until patient returned to MD.  No further f/u sought from patient.  Goals not reassessed.   Remaining deficits: See re-eval from 08/03/2015   Education / Equipment: HEP  Plan: Patient agrees to discharge.  Patient goals were not met. Patient is being discharged due to not returning since the last visit.  ?????       Thank you for the referral of this patient. Rudell Cobb, MPT   Jahziah Simonin 10/28/2015, 10:27 AM  [facility] [address] [place], [place], [address] Phone: [phone]   Fax:  [phone]

## 2015-11-01 DIAGNOSIS — M1612 Unilateral primary osteoarthritis, left hip: Secondary | ICD-10-CM

## 2015-11-01 HISTORY — DX: Unilateral primary osteoarthritis, left hip: M16.12

## 2015-11-05 ENCOUNTER — Telehealth: Payer: Self-pay | Admitting: Neurology

## 2015-11-05 DIAGNOSIS — R42 Dizziness and giddiness: Secondary | ICD-10-CM

## 2015-11-05 NOTE — Telephone Encounter (Signed)
LVM for patient informing her PT order has been placed and she will receive call next week to schedule. Left this caller's name, number and advised office is now closed but opens Monday at [facility].

## 2015-11-05 NOTE — Telephone Encounter (Signed)
Pt called sts she's been having dizziness and vertigo daily for about 4 weeks. She said it never completely disapated with PT . She is wanting to have PT again for this as it did help. She would like to see Rudell Cobb again at [facility]. Pt is aware Dr Jaynee Eagles will be back in the office on Monday and is ok to wait until then

## 2015-11-05 NOTE — Telephone Encounter (Signed)
That's fine with me. Molly Giles can you order it please? thanks

## 2015-11-10 ENCOUNTER — Ambulatory Visit
Payer: No Typology Code available for payment source | Attending: Neurology | Admitting: Rehabilitative and Restorative Service Providers"

## 2015-11-10 DIAGNOSIS — H8112 Benign paroxysmal vertigo, left ear: Secondary | ICD-10-CM | POA: Diagnosis present

## 2015-11-10 DIAGNOSIS — R42 Dizziness and giddiness: Secondary | ICD-10-CM | POA: Insufficient documentation

## 2015-11-10 DIAGNOSIS — R269 Unspecified abnormalities of gait and mobility: Secondary | ICD-10-CM

## 2015-11-10 HISTORY — PX: INJECTION HIP INTRA ARTICULAR: SHX2445

## 2015-11-10 NOTE — Therapy (Signed)
[facility] [address] [place], [place], [address] Phone: [phone]   Fax:  [phone]  Physical Therapy Evaluation  Patient Details  Name: Molly Giles MRN: [id number] Date of Birth: 1956-09-06 No Data Recorded  Encounter Date: 11/10/2015      PT End of Session - 11/10/15 1332    Visit Number 1   Number of Visits 5   Date for PT Re-Evaluation 12/10/15   PT Start Time [id number]   PT Stop Time 1315   PT Time Calculation (min) 40 min   Activity Tolerance Patient tolerated treatment well   Behavior During Therapy [facility] for tasks assessed/performed      Past Medical History  Diagnosis Date  . Mood disorder ([place])   . Fibromyalgia   . Hypertension 2004  . Hyperlipidemia 1998  . Migraine 1962    Chronic Migraines  . Hypothyroidism   . PONV (postoperative nausea and vomiting)   . Family history of adverse reaction to anesthesia     Brother- N/V  . Dysrhythmia     seen by dr Wynonia Lawman- not a problem since she has been on Bystolic  . Shortness of breath dyspnea     with exertion  . Depression   . Arthritis   . Interstitial cystitis   . History of Clostridium difficile colitis 07/01/2015    Required Fecal Transplantation tocure  . Normal coronary arteries 05/14/2014  . Family history of premature CAD 05/15/2013  . H/O seasonal allergies   . Palpitations 05/15/2013    [facility] manages  . GERD (gastroesophageal reflux disease) 12/16/2014  . Hx of bad fall 02/2015    Severe Facial/head trauma without fracture  . Impairment of balance 02/2015    Consequent of postconcussive syndrome  . Benign positional vertigo 04/2015    Responded well to Vestibular Rehab  . Bruxism (teeth grinding)   . Degenerative cervical disc   . Overweight   . Thyroid nodule 08/11/2009    Findings: The thyroid gland is within normal limits in size.  The gland is diffusely inhomogeneous. A small solid nodule is noted in the lower pole   medially on the right of 7 x 6 x 8 mm. A small solid nodule is noted inferiorly on the left of 3 x 3 x 4 mm.  IMPRESSION:  The thyroid gland is within normal limits in size with only small solid nodules present, the largest of only 8 mm in diameter on the right.      Past Surgical History  Procedure Laterality Date  . Carpometacarpal joint arthrotomy Right 2011  . Cardiovascular stress test  2000    Unremarkable per pt report  . Breast biopsy  2011    Benign histology  . Colonoscopy    . Bladder dilitation      x 3  . Colonoscopy with propofol N/A 04/21/2015    Procedure: COLONOSCOPY WITH PROPOFOL;  Surgeon: Carol Ada, MD;  Location: [facility];  Service: Endoscopy;  Laterality: N/A;  . Fecal transplant  04/21/2015    Procedure: FECAL TRANSPLANT;  Surgeon: Carol Ada, MD;  Location: [facility];  Service: Endoscopy;;  . Other surgical history Left 2016    Left L3/L4 medial nerve block and Left L5 Dorsal Ramus block Dr Mickel Duhamel    There were no vitals filed for this visit.  Visit Diagnosis:  Abnormality of gait  Dizziness and giddiness      Subjective Assessment - 11/10/15 1241    Subjective  The patient reports that she is still noticing dizziness with quick rolling in bed or bending forward and returning to upright.  Symptoms last for seconds and she can get accompanying nausea.  The patient reports she has seen Dr. 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    Pertinent History migraines 5 days/week   Patient Stated Goals reduce dizziness (never went completely away per patient report)   Currently in Pain? Yes   Pain Score --  moderate pain   Pain Location Head   Pain Orientation Posterior   Pain Descriptors / Indicators Aching;Dull   Pain Type Chronic pain   Pain Onset Today   Pain Frequency Intermittent   Aggravating Factors  fragrances, odors, barometric pressure   Pain Relieving Factors medications             [facility] PT Assessment - 11/10/15 1245    Assessment   Medical Diagnosis dizziness   Onset Date/Surgical Date --  04/2015   Prior Therapy known to our clinic from recent post concussive and BPPV treatment.   Balance Screen   Has the patient fallen in the past 6 months No   Has the patient had a decrease in activity level because of a fear of falling?  No   Is the patient reluctant to leave their home because of a fear of falling?  No   Home Environment   Living Environment Private residence   Living Arrangements Spouse/significant other   Prior Function   Level of Independence Independent   Vocation Part time employment            Vestibular Assessment - 11/10/15 1246    Symptom Behavior   Type of Dizziness Spinning   Frequency of Dizziness daily   Duration of Dizziness seconds   Aggravating Factors Rolling to right;Rolling to left   Relieving Factors Head stationary   Occulomotor Exam   Occulomotor Alignment Normal   Spontaneous Absent   Gaze-induced Absent   Smooth Pursuits Intact   Saccades Intact   Vestibulo-Occular Reflex   VOR 1 Head Only (x 1 viewing) self regulated pace provokes a headache within 8-10 repetitions   Positional Testing   Dix-Hallpike Dix-Hallpike Right;Dix-Hallpike Left   Sidelying Test Sidelying Right;Sidelying Left   Horizontal Canal Testing Horizontal Canal Right;Horizontal Canal Left   Dix-Hallpike Right   Dix-Hallpike Right Duration none   Dix-Hallpike Right Symptoms No nystagmus   Dix-Hallpike Left   Dix-Hallpike Left Duration seconds  sensation of moderate dizziness   Dix-Hallpike Left Symptoms No nystagmus   Sidelying Right   Sidelying Right Duration seconds  "inside my head is spinning"   Sidelying Right Symptoms No nystagmus   Sidelying Left   Sidelying Left Duration none   Sidelying Left Symptoms No nystagmus   Horizontal Canal Right   Horizontal Canal Right Duration none   Horizontal Canal Right Symptoms Normal   Horizontal  Canal Left   Horizontal Canal Left Duration seconds  experiences dizziness, but no nystagmus   Horizontal Canal Left Symptoms Normal   Orthostatics   BP supine (x 5 minutes) 105/72 mmHg   HR supine (x 5 minutes) 66   BP standing (after 1 minute) 96/73 mmHg   HR standing (after 1 minute) 74   BP standing (after 3 minutes) 99/69 mmHg   HR standing (after 3 minutes) 67   Orthostatics Comment brief lightheadedness upon standing, settles within seconds  Vestibular Treatment/Exercise - 11/10/15 1306    Vestibular Treatment/Exercise   Vestibular Treatment Provided Canalith Repositioning   Canalith Repositioning Epley Manuever Left   Habituation Exercises Brandt Daroff;Horizontal Roll    EPLEY MANUEVER LEFT   Number of Reps  1   Overall Response  No change               PT Education - 11/10/15 1332    Education provided Yes   Education Details HEP: provided written copy of habituation (instructed in the past) for patient to begin in 2 days.     Person(s) Educated Patient   Methods Explanation   Comprehension Verbalized understanding             PT Long Term Goals - 11/10/15 1352    PT LONG TERM GOAL #1   Title The patient will return demo HEP for habituation, gaze.   Baseline Target date=12/10/2015   Time 4   Period Weeks   PT LONG TERM GOAL #2   Title The patient will have negative positional testing for nystagmus and dizziness.   Baseline Target date 12/10/2015   Time 4   Period Weeks   PT LONG TERM GOAL #3   Title The patient will verbalize understanding of future mgmt of BPPV prior to d/c.   Baseline Target date 12/10/2015   Time 4   Period Weeks               Plan - 11/10/15 1354    Clinical Impression Statement The patient is a 60 year old female presenting to physical therapy with worsening sensation of dizziness with rolling in bed, lightheadedness at times upon rising, h/o migraines, and h/o concussion.  Patient is known to  our clinic from prior vestibular rehab and initially responded well to canolith repositioning maneuvers in the fall of 2016 after sustaining a concussion.  PT to treat based on symptoms as nystagmus not viewed in room light today and educate patient on self mgmt of symptoms for post d/c activities.   Pt will benefit from skilled therapeutic intervention in order to improve on the following deficits Abnormal gait;Decreased activity tolerance;Decreased balance;Decreased mobility;Dizziness;Pain   Rehab Potential Good   PT Frequency 1x / week   PT Duration 4 weeks   PT Treatment/Interventions Therapeutic exercise;Therapeutic activities;Functional mobility training;Balance training;Neuromuscular re-education;Gait training;Patient/family education;Vestibular;Canalith Repostioning;Manual techniques   PT Next Visit Plan Reassess BPPV, recheck HEP, add gaze x 1 viewing for HEP.   Consulted and Agree with Plan of Care Patient         Problem List Patient Active Problem List   Diagnosis Date Noted  . Hearing loss of both ears 07/27/2015  . Chronic migraine without aura, with intractable migraine, so stated, with status migrainosus 07/14/2015  . Post concussive syndrome 07/14/2015  . Musculoskeletal neck pain 07/14/2015  . Depression (NOS) 07/02/2015  . Allergic rhinoconjunctivitis 07/02/2015  . Fibromyalgia 07/01/2015  . History of Clostridium difficile colitis, persistent 07/01/2015  . Post concussion syndrome 06/06/2015  . Impairment of balance 02/10/2015  . Essential hypertension 05/15/2013  . Hypothyroidism 05/15/2013  . Hyperlipidemia 05/15/2013  . Palpitations 05/15/2013  . Interstitial cystitis 05/15/2013  . History of migraine headaches 05/15/2013  . 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

## 2015-11-10 NOTE — Patient Instructions (Signed)
Tip Card 1.The goal of habituation training is to assist in decreasing symptoms of vertigo, dizziness, or nausea provoked by specific head and body motions. 2.These exercises may initially increase symptoms; however, be persistent and work through symptoms. With repetition and time, the exercises will assist in reducing or eliminating symptoms. 3.Exercises should be stopped and discussed with the therapist if you experience any of the following: - Sudden change or fluctuation in hearing - New onset of ringing in the ears, or increase in current intensity - Any fluid discharge from the ear - Severe pain in neck or back - Extreme nausea  Copyright  VHI. All rights reserved.  Rolling   With pillow under head, start on back. Roll to your right side.  Hold until dizziness stops, plus 20 seconds and then roll to the left side.  Hold until dizziness stops, plus 20 seconds.  Repeat sequence 5 times per session. Do 2 sessions per day.  Copyright  VHI. All rights reserved.  Sit to Side-Lying   Sit on edge of bed. Lie down onto the right side and hold until dizziness stops, plus 20 seconds.  Return to sitting and wait until dizziness stops, plus 20 seconds.  Repeat to the left side. Repeat sequence 5 times per session. Do 2 sessions per day.  Copyright  VHI. All rights reserved.   

## 2015-11-24 ENCOUNTER — Ambulatory Visit: Payer: No Typology Code available for payment source | Admitting: Rehabilitative and Restorative Service Providers"

## 2015-11-24 DIAGNOSIS — R269 Unspecified abnormalities of gait and mobility: Secondary | ICD-10-CM

## 2015-11-24 DIAGNOSIS — R42 Dizziness and giddiness: Secondary | ICD-10-CM

## 2015-11-24 NOTE — Therapy (Signed)
[facility] [address] [place], [place], [address] Phone: [phone]   Fax:  [phone]  Physical Therapy Treatment  Patient Details  Name: Molly Giles MRN: [id number] Date of Birth: Aug 03, 1956 No Data Recorded  Encounter Date: 11/24/2015      PT End of Session - 11/24/15 1353    Visit Number 2   Number of Visits 5   Date for PT Re-Evaluation 12/10/15   Authorization Type needs authorization   PT Start Time [phone]   PT Stop Time 0930   PT Time Calculation (min) 38 min   Activity Tolerance Patient tolerated treatment well   Behavior During Therapy [facility] for tasks assessed/performed      Past Medical History  Diagnosis Date  . Mood disorder ([place])   . Fibromyalgia   . Hypertension 2004  . Hyperlipidemia 1998  . Migraine 1962    Chronic Migraines  . Hypothyroidism   . PONV (postoperative nausea and vomiting)   . Family history of adverse reaction to anesthesia     Brother- N/V  . Dysrhythmia     seen by dr Wynonia Lawman- not a problem since she has been on Bystolic  . Shortness of breath dyspnea     with exertion  . Depression   . Arthritis   . Interstitial cystitis   . History of Clostridium difficile colitis 07/01/2015    Required Fecal Transplantation tocure  . Normal coronary arteries 05/14/2014  . Family history of premature CAD 05/15/2013  . H/O seasonal allergies   . Palpitations 05/15/2013    [facility] manages  . GERD (gastroesophageal reflux disease) 12/16/2014  . Hx of bad fall 02/2015    Severe Facial/head trauma without fracture  . Impairment of balance 02/2015    Consequent of postconcussive syndrome  . Benign positional vertigo 04/2015    Responded well to Vestibular Rehab  . Bruxism (teeth grinding)   . Degenerative cervical disc   . Overweight   . Thyroid nodule 08/11/2009    Findings: The thyroid gland is within normal limits in size.  The gland is diffusely inhomogeneous. A small solid  nodule is noted in the lower pole  medially on the right of 7 x 6 x 8 mm. A small solid nodule is noted inferiorly on the left of 3 x 3 x 4 mm.  IMPRESSION:  The thyroid gland is within normal limits in size with only small solid nodules present, the largest of only 8 mm in diameter on the right.      Past Surgical History  Procedure Laterality Date  . Carpometacarpal joint arthrotomy Right 2011  . Cardiovascular stress test  2000    Unremarkable per pt report  . Breast biopsy  2011    Benign histology  . Colonoscopy    . Bladder dilitation      x 3  . Colonoscopy with propofol N/A 04/21/2015    Procedure: COLONOSCOPY WITH PROPOFOL;  Surgeon: Carol Ada, MD;  Location: [facility];  Service: Endoscopy;  Laterality: N/A;  . Fecal transplant  04/21/2015    Procedure: FECAL TRANSPLANT;  Surgeon: Carol Ada, MD;  Location: [facility];  Service: Endoscopy;;  . Other surgical history Left 2016    Left L3/L4 medial nerve block and Left L5 Dorsal Ramus block Dr Mickel Duhamel    There were no vitals filed for this visit.  Visit Diagnosis:  Abnormality of gait  Dizziness and giddiness      Subjective Assessment -  11/24/15 0857    Subjective The patient reports she is continuing to experience dizziness with position changes worse with turning over in bed, bending forward.  She still notes dizziness during HEP.  She has tightness across upper trapezius muscles and continues with daily headaches.    Patient Stated Goals reduce dizziness (never went completely away per patient report)   Currently in Pain? Yes          Vestibular Assessment - 11/24/15 0913    Positional Testing   Dix-Hallpike Dix-Hallpike Right;Dix-Hallpike Left   Sidelying Test Sidelying Right;Sidelying Left   Horizontal Canal Testing Horizontal Canal Right;Horizontal Canal Left   Dix-Hallpike Right   Dix-Hallpike Right Duration none   Dix-Hallpike Right Symptoms No nystagmus   Dix-Hallpike Left   Dix-Hallpike Left  Duration none   Dix-Hallpike Left Symptoms No nystagmus   Sidelying Right   Sidelying Right Duration none   Sidelying Right Symptoms No nystagmus   Sidelying Left   Sidelying Left Duration none   Sidelying Left Symptoms No nystagmus   Horizontal Canal Right   Horizontal Canal Right Duration none   Horizontal Canal Right Symptoms Normal   Horizontal Canal Left   Horizontal Canal Left Duration none   Horizontal Canal Left Symptoms Normal          [facility] Adult PT Treatment/Exercise - 11/24/15 1430    Self-Care   Self-Care Other Self-Care Comments   Other Self-Care Comments  Discussed continuing HEP and f/u with PT for one further visit in 2 weeks to observe progression of home activities.  PT recommended patient keep journal for logging symptoms to look for associations in dizziness with other symptoms (headache, lightheadedness, changes in BP).         Vestibular Treatment/Exercise - 11/24/15 0001    Vestibular Treatment/Exercise   Vestibular Treatment Provided Habituation;Gaze   Habituation Exercises Laruth Bouchard Daroff;Horizontal Roll   Gaze Exercises X1 Viewing Horizontal;X1 Viewing Vertical   Nestor Lewandowsky   Number of Reps  2   Symptom Description  reviewed for HEP   Horizontal Roll   Number of Reps  3   Symptom Description  discussed for HEP   Seated Horizontal Head Turns   Symptom Description  had paitent replicate looking down and return to looking straight ahead without symptoms today.  Repeated x 5 reps.   X1 Viewing Horizontal   Foot Position seated   Reps --  30 seconds    Comments provided cues for speed of movement and how to modify if provokes symtpoms too great in intensity.   X1 Viewing Vertical   Foot Position seated   Reps --  reviewed for HEP               PT Education - 11/24/15 1426    Education provided Yes   Education Details HEP: habituation and gaze stabilization   Person(s) Educated Patient   Methods Explanation;Demonstration;Handout    Comprehension Verbalized understanding;Returned demonstration             PT Long Term Goals - 11/10/15 1352    PT LONG TERM GOAL #1   Title The patient will return demo HEP for habituation, gaze.   Baseline Target date=12/10/2015   Time 4   Period Weeks   PT LONG TERM GOAL #2   Title The patient will have negative positional testing for nystagmus and dizziness.   Baseline Target date 12/10/2015   Time 4   Period Weeks   PT LONG TERM GOAL #3  Title The patient will verbalize understanding of future mgmt of BPPV prior to d/c.   Baseline Target date 12/10/2015   Time 4   Period Weeks               Plan - 11/24/15 1435    Clinical Impression Statement The patient's positional testing continues to be negative for nystagmus.  She c/o general dizziness noticed at times with bending forward to reach for items at work.  PT progressed HEP and recommends patient f/u in 2 weeks after working on home exercises.   PT Next Visit Plan Reassess BPPV, recheck HEP, add gaze x 1 viewing for HEP.  ? d/c if able based on symptoms and ability to perform HEP?   Consulted and Agree with Plan of Care Patient        Problem List Patient Active Problem List   Diagnosis Date Noted  . Hearing loss of both ears 07/27/2015  . Chronic migraine without aura, with intractable migraine, so stated, with status migrainosus 07/14/2015  . Post concussive syndrome 07/14/2015  . Musculoskeletal neck pain 07/14/2015  . Depression (NOS) 07/02/2015  . Allergic rhinoconjunctivitis 07/02/2015  . Fibromyalgia 07/01/2015  . History of Clostridium difficile colitis, persistent 07/01/2015  . Post concussion syndrome 06/06/2015  . Impairment of balance 02/10/2015  . Essential hypertension 05/15/2013  . Hypothyroidism 05/15/2013  . Hyperlipidemia 05/15/2013  . Palpitations 05/15/2013  . Interstitial cystitis 05/15/2013  . History of migraine headaches 05/15/2013  . 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 [place], [place], [address] Phone: [phone]   Fax:  [phone]  Name: Molly Giles MRN: [id number] Date of Birth: [birth year]

## 2015-11-24 NOTE — Patient Instructions (Signed)
Tip Card 1.The goal of habituation training is to assist in decreasing symptoms of vertigo, dizziness, or nausea provoked by specific head and body motions. 2.These exercises may initially increase symptoms; however, be persistent and work through symptoms. With repetition and time, the exercises will assist in reducing or eliminating symptoms. 3.Exercises should be stopped and discussed with the therapist if you experience any of the following: - Sudden change or fluctuation in hearing - New onset of ringing in the ears, or increase in current intensity - Any fluid discharge from the ear - Severe pain in neck or back - Extreme nausea  Copyright  VHI. All rights reserved.  Rolling   With pillow under head, start on back. Roll to your right side.  Hold until dizziness stops, plus 20 seconds and then roll to the left side.  Hold until dizziness stops, plus 20 seconds.  Repeat sequence 5 times per session. Do 2 sessions per day. DO NOT STOP IN THE MIDDLE.  Copyright  VHI. All rights reserved.  Sit to Side-Lying   Sit on edge of bed. Lie down onto the right side and hold until dizziness stops, plus 20 seconds.  Return to sitting and wait until dizziness stops, plus 20 seconds.  Repeat to the left side. Repeat sequence 5 times per session. Do 2 sessions per day.  Copyright  VHI. All rights reserved.  Gaze Stabilization: Tip Card 1.Target must remain in focus, not blurry, and appear stationary while head is in motion. 2.Perform exercises with small head movements (45 to either side of midline). 3.Increase speed of head motion so long as target is in focus. 4.If you wear eyeglasses, be sure you can see target through lens (therapist will give specific instructions for bifocal / progressive lenses). 5.These exercises may provoke dizziness or nausea. Work through these symptoms. If too dizzy, slow head movement slightly. Rest between each exercise. 6.Exercises demand concentration; avoid  distractions. 7.For safety, perform standing exercises close to a counter, wall, corner, or next to someone.  Copyright  VHI. All rights reserved.  Gaze Stabilization: Standing Feet Apart   Feet shoulder width apart, keeping eyes on target on wall 3 feet away, tilt head down slightly and move head side to side for 30 seconds. Repeat while moving head up and down for 30 seconds. Do 2 sessions per day. BEGIN THIS SEATED, AND BE SURE YOU ARE MOVING TO THE LEFT SIDE EQUAL TO THE RIGHT.  Copyright  VHI. All rights reserved.   Special Instructions: Exercises may bring on mild to moderate symptoms of headache, dizziness, nausea that resolve within 30 minutes of completing exercises. If symptoms are lasting longer than 30 minutes, modify your exercises by:  >decreasing the # of times you complete each activity >ensuring your symptoms return to baseline before moving onto the next exercise >dividing up exercises so you do not do them all in one session, but multiple short sessions throughout the day >doing them once a day until symptoms improve

## 2015-12-01 ENCOUNTER — Encounter: Payer: No Typology Code available for payment source | Admitting: Rehabilitative and Restorative Service Providers"

## 2015-12-03 ENCOUNTER — Encounter: Payer: Self-pay | Admitting: Family Medicine

## 2015-12-03 DIAGNOSIS — IMO0001 Reserved for inherently not codable concepts without codable children: Secondary | ICD-10-CM | POA: Insufficient documentation

## 2015-12-03 DIAGNOSIS — H8101 Meniere's disease, right ear: Secondary | ICD-10-CM

## 2015-12-03 HISTORY — DX: Meniere's disease, right ear: H81.01

## 2015-12-08 ENCOUNTER — Ambulatory Visit: Payer: No Typology Code available for payment source | Admitting: Rehabilitative and Restorative Service Providers"

## 2015-12-08 DIAGNOSIS — R269 Unspecified abnormalities of gait and mobility: Secondary | ICD-10-CM | POA: Diagnosis not present

## 2015-12-08 DIAGNOSIS — R42 Dizziness and giddiness: Secondary | ICD-10-CM

## 2015-12-08 DIAGNOSIS — H8112 Benign paroxysmal vertigo, left ear: Secondary | ICD-10-CM

## 2015-12-08 NOTE — Patient Instructions (Signed)
Scapular Retraction: Abduction (Standing)    With arms elevated and elbows bent to 90, pinch shoulder blades together and press arms back. Repeat _10-15___ times per set. Do __2__ sets per session. Do _2___ sessions per day.  http://orth.exer.us/951   Copyright  VHI. All rights reserved.  Pectoral Stretch, Standing    ONE SIDE AT A TIME. Stand in doorframe with palms against frame and arms at 90. Step through doorway allowing arms to straighten. Hold 30 ___ seconds. Repeat _3__ times per session. Do __2_ sessions per day.  Copyright  VHI. All rights reserved.

## 2015-12-08 NOTE — Therapy (Signed)
[facility] [address] [place], [place], [address] Phone: [phone]   Fax:  [phone]  Physical Therapy Treatment  Patient Details  Name: Molly Giles MRN: [id number] Date of Birth: October 15, 1955 No Data Recorded  Encounter Date: 12/08/2015      PT End of Session - 12/08/15 1728    Visit Number 3   Number of Visits 5   Date for PT Re-Evaluation 12/10/15   Authorization Type verified through insurance dept   PT Start Time 1240   PT Stop Time 1318   PT Time Calculation (min) 38 min   Activity Tolerance Patient tolerated treatment well   Behavior During Therapy [facility] for tasks assessed/performed      Past Medical History  Diagnosis Date  . Mood disorder ([place])   . Fibromyalgia   . Hypertension 2004  . Hyperlipidemia 1998  . Migraine 1962    Chronic Migraines  . Hypothyroidism   . PONV (postoperative nausea and vomiting)   . Family history of adverse reaction to anesthesia     Brother- N/V  . Dysrhythmia     seen by dr Wynonia Lawman- not a problem since she has been on Bystolic  . Shortness of breath dyspnea     with exertion  . Depression   . Arthritis   . Interstitial cystitis   . History of Clostridium difficile colitis 07/01/2015    Required Fecal Transplantation tocure  . Normal coronary arteries 05/14/2014  . Family history of premature CAD 05/15/2013  . H/O seasonal allergies   . Palpitations 05/15/2013    [facility] manages  . GERD (gastroesophageal reflux disease) 12/16/2014  . Hx of bad fall 02/2015    Severe Facial/head trauma without fracture  . Impairment of balance 02/2015    Consequent of postconcussive syndrome  . Benign positional vertigo 04/2015    Responded well to Vestibular Rehab  . Bruxism (teeth grinding)   . Degenerative cervical disc   . Overweight   . Thyroid nodule 08/11/2009    Findings: The thyroid gland is within normal limits in size.  The gland is diffusely inhomogeneous. A  small solid nodule is noted in the lower pole  medially on the right of 7 x 6 x 8 mm. A small solid nodule is noted inferiorly on the left of 3 x 3 x 4 mm.  IMPRESSION:  The thyroid gland is within normal limits in size with only small solid nodules present, the largest of only 8 mm in diameter on the right.    . Post concussion syndrome 06/06/2015  . 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 Carpometacarpal joint arthrotomy Right 2011  . Cardiovascular stress test  2000    Unremarkable per pt report  . Breast biopsy  2011    Benign histology  . Colonoscopy    . Bladder dilitation      x 3  . Colonoscopy with propofol N/A 04/21/2015    Procedure: COLONOSCOPY WITH PROPOFOL;  Surgeon: Carol Ada, MD;  Location: [facility];  Service: Endoscopy;  Laterality: N/A;  . Fecal transplant  04/21/2015    Procedure: FECAL TRANSPLANT;  Surgeon: Carol Ada, MD;  Location: [facility];  Service: Endoscopy;;  . Other surgical history Left 2016    Left L3/L4 medial nerve block and Left L5 Dorsal Ramus block Dr Mickel Duhamel    There were no vitals filed for this visit.  Visit Diagnosis:  Dizziness and giddiness  BPPV (benign paroxysmal positional vertigo), left      Subjective Assessment - 12/08/15 1245    Subjective The patient reports that symptoms are better with less episodes of dizziness.  She is taking notes on headaches/symptoms and dizziness does appear associated with migraine headaches.     Patient Stated Goals reduce dizziness (never went completely away per patient report)   Currently in Pain? Yes   Pain Location Neck                Vestibular Assessment - 12/08/15 1731    Vestibular Assessment   General Observation no sensation of spinning dizziness, however occasional dizzy sensation associated with headaches (patient has been journaling to monitor symptoms)   Positional Testing   Dix-Hallpike Dix-Hallpike  Right;Dix-Hallpike Left   Dix-Hallpike Right   Dix-Hallpike Right Duration none   Dix-Hallpike Right Symptoms No nystagmus   Dix-Hallpike Left   Dix-Hallpike Left Duration none   Dix-Hallpike Left Symptoms No nystagmus                 [facility] Adult PT Treatment/Exercise - 12/08/15 1732    Self-Care   Self-Care Other Self-Care Comments   Other Self-Care Comments  Discussed continued HEP as needed for positional sensitivities related to concussion (need this activity when sensitive to rolling in bed and moving sit<>supine).  Recommended gaze x 1 exercise continued to tolerance without performing on days with migraines.     Exercises   Exercises Other Exercises   Other Exercises  Provided"W" scapular retraction exercises, provided anterior chest wall stretch for pec stretch.          Vestibular Treatment/Exercise - 12/08/15 1732    Vestibular Treatment/Exercise   Vestibular Treatment Provided Gaze   Gaze Exercises X1 Viewing Horizontal   X1 Viewing Horizontal   Foot Position Reviewed seated activities   Comments Discussed continuing after d/c today.  Recommended the patient perform at slower pace and increase duration of activity to tolerance.                 PT Education - 12/08/15 1728    Education provided Yes   Education Details HEP: scapular retraction, anterior chest stretch   Person(s) Educated Patient   Methods Explanation;Demonstration;Handout   Comprehension Verbalized understanding;Returned demonstration             PT Long Term Goals - 12/08/15 1729    PT LONG TERM GOAL #1   Title The patient will return demo HEP for habituation, gaze.   Baseline Target date=12/10/2015   Time 4   Period Weeks   Status Achieved   PT LONG TERM GOAL #2   Title The patient will have negative positional testing for nystagmus and dizziness.   Baseline Target date 12/10/2015   Time 4   Period Weeks   Status Achieved   PT LONG TERM GOAL #3   Title The patient  will verbalize understanding of future mgmt of BPPV prior to d/c.   Baseline Target date 12/10/2015   Time 4   Period Weeks   Status Achieved               Plan - 12/08/15 1729    Clinical Impression Statement The patient met LTGs.  She continues with occasional dizziness that she feels is associated with migraines.  Overall, sensation of dizziness with motion has improved.  Patient educated on how to self manage with HEP as needed.   PT Next Visit Plan Discharge  today.   Consulted and Agree with Plan of Care Patient        Problem List Patient Active Problem List   Diagnosis Date Noted  . 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 Hearing loss of both ears 07/27/2015  . Chronic migraine without aura, with intractable migraine, so stated, with status migrainosus 07/14/2015  . Post concussive syndrome 07/14/2015  . Musculoskeletal neck pain 07/14/2015  . Depression (NOS) 07/02/2015  . Allergic rhinoconjunctivitis 07/02/2015  . Fibromyalgia 07/01/2015  . History of Clostridium difficile colitis, persistent 07/01/2015  . Impairment of balance 02/10/2015  . Essential hypertension 05/15/2013  . Hypothyroidism 05/15/2013  . Hyperlipidemia 05/15/2013  . Interstitial cystitis 05/15/2013  . History of migraine headaches 05/15/2013  . 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  Plan: Patient agrees to discharge.  Patient goals were met. Patient is being discharged due to meeting the stated rehab goals.  ?????       Thank you for the referral of this patient. Rudell Cobb,  MPT  [place], PT 12/08/2015, 5:39 PM  [facility] [address] [place], [place], [address] Phone: [phone]   Fax:  [phone]  Name: Molly Giles MRN: [id number] Date of Birth: [birth year]

## 2015-12-10 ENCOUNTER — Telehealth: Payer: Self-pay | Admitting: *Deleted

## 2015-12-10 NOTE — Telephone Encounter (Signed)
Pt received letter from her insurance co saying that they did not want to cover her preferred strength of synthroid. Pt states the letter says to have your doctor consider the generic and or send a letter saying the reason why pt needed the brand Synthroid. Pt says she does not want levothyroxine because it did not manage her hypothyroidism. She said the Synthroid brand name worked to manage her hypothyroidism better. She has 16 days left of her medication. Told patient her doctor will need to be consulted. Told pt we will follow up with her next week.   Deferred to Dr Arnette Norris, CMA.

## 2015-12-10 NOTE — Telephone Encounter (Signed)
Patient left a message on the refill voicemail stating that the dose of synthroid that she currently takes is no longer a preferred strength under her insurance. She can be reached at [phone]. Please advise. Thanks, MI

## 2015-12-11 NOTE — Telephone Encounter (Signed)
Try to get covered; if pt cannot tolerate generic

## 2015-12-14 NOTE — Telephone Encounter (Signed)
Message routed to [facility]

## 2015-12-17 ENCOUNTER — Encounter: Payer: Self-pay | Admitting: Family Medicine

## 2015-12-17 ENCOUNTER — Ambulatory Visit (INDEPENDENT_AMBULATORY_CARE_PROVIDER_SITE_OTHER): Payer: No Typology Code available for payment source | Admitting: Family Medicine

## 2015-12-17 VITALS — BP 110/77 | HR 77 | Temp 98.3°F | Ht 66.5 in | Wt 215.3 lb

## 2015-12-17 DIAGNOSIS — J069 Acute upper respiratory infection, unspecified: Secondary | ICD-10-CM | POA: Diagnosis not present

## 2015-12-17 MED ORDER — AZITHROMYCIN 250 MG PO TABS: ORAL_TABLET | ORAL | Status: DC

## 2015-12-17 NOTE — Patient Instructions (Signed)
Thank you so much for coming to visit me today! I have sent an antibiotic to the pharmacy. You will take two tablets today followed by one tablet daily until course is complete. You may continue to use mucinex and may consider honey, nasal saline, cough drops. If your symptoms fail to improve over the next 1-2 weeks please return, however note that cough may linger for a few weeks.  Thanks again! Dr. Gerlean Ren

## 2015-12-19 NOTE — Progress Notes (Signed)
Subjective:     Patient ID: Molly Giles, female   DOB: September 02, 1956, 60 y.o.   MRN: [id number]  HPI Molly Giles is a 60yo female presenting today for upper respiratory infection. - Symptoms started Sunday 12/12/15 - Notes cough producing yellow sputum, congestion - Notes wheezing last night. Denies shortness of breath or other occurences of wheezing. - Denies fevers, chills, nausea, vomiting, diarrhea - States symptoms have been getting progressively worse over the last week - Has been using Mucinex every 12hr, which helps but then wears off - Sick Contact: Husband sick with similar symptoms for two weeks - Reports allergy to Sulfa  Review of Systems Per HPI. Other systems negative.    Objective:   Physical Exam  Constitutional: She appears well-developed and well-nourished. No distress.  HENT:  Head: Normocephalic and atraumatic.  Mouth/Throat: Oropharynx is clear and moist.  Right and left tympanic membranes normal. No sinus tenderness noted.  Eyes: Pupils are equal, round, and reactive to light. Right eye exhibits no discharge. Left eye exhibits no discharge.  Cardiovascular: Normal rate and regular rhythm.  Exam reveals no gallop and no friction rub.   No murmur heard. Pulmonary/Chest: Effort normal. No respiratory distress. She has no wheezes.  Abdominal: Soft. She exhibits no distension. There is no tenderness.  Lymphadenopathy:    She has no cervical adenopathy.  Skin: No rash noted.  Psychiatric: She has a normal mood and affect. Her behavior is normal.      Assessment and Plan:     1. Acute upper respiratory infection - Prescription for Azithromycin printed and given with instructions to fill over next 2-3 days if symptoms continue to worsen - May continue to use Mucinex for symptom relief. - May also use honey, nasal saline, Chloraseptic spray, cough drops - "Wheezing" likely upper respiratory noises, no shortness of breath and no wheezing today. Recommend propping up at  night to help with breathing. - Return if symptoms worsen or fail to improve. History of C. Difficile noted, to return sooner if diarrhea occurs.

## 2015-12-21 ENCOUNTER — Telehealth: Payer: Self-pay

## 2015-12-21 NOTE — Telephone Encounter (Signed)
Attempted to call patient. Mailbox full and could not accept messages. I don't see any documentation ie, TSH values that indicate generic did not work. I don't even know when it was changed to Brand.

## 2015-12-21 NOTE — Telephone Encounter (Signed)
Prior auth for Brand Synthroid 125 mcg sent to [facility]. Patient will be getting this from her new PCP hereafter.

## 2015-12-29 ENCOUNTER — Telehealth: Payer: Self-pay | Admitting: Neurology

## 2015-12-29 NOTE — Telephone Encounter (Signed)
Called pt back. Relayed ok to keep existing appt for regular f/u per Dr Jaynee Eagles. Pt verbalized understanding.

## 2015-12-29 NOTE — Telephone Encounter (Signed)
Dr Ahern- please advise 

## 2015-12-29 NOTE — Telephone Encounter (Signed)
That's fine. thanks

## 2015-12-29 NOTE — Telephone Encounter (Signed)
Pt called in about her botox appt. Pt does not want to have botox but still wants to come in and talk with Dr. Jaynee Eagles about other treatment options. If I cancel her appt the pt would not be able to come in for quit some time. Will it be ok to keep the appt as is and [name] it as an office visit? Please call pt to update. Thank you [phone]

## 2016-01-06 ENCOUNTER — Ambulatory Visit: Payer: No Typology Code available for payment source | Admitting: Family Medicine

## 2016-01-12 ENCOUNTER — Ambulatory Visit (INDEPENDENT_AMBULATORY_CARE_PROVIDER_SITE_OTHER): Payer: No Typology Code available for payment source | Admitting: Neurology

## 2016-01-12 ENCOUNTER — Encounter: Payer: Self-pay | Admitting: Neurology

## 2016-01-12 VITALS — BP 109/75 | HR 87 | Ht 66.5 in | Wt 213.8 lb

## 2016-01-12 DIAGNOSIS — G43909 Migraine, unspecified, not intractable, without status migrainosus: Secondary | ICD-10-CM

## 2016-01-12 MED ORDER — MEMANTINE HCL 10 MG PO TABS: 10.0000 mg | ORAL_TABLET | Freq: Two times a day (BID) | ORAL | Status: DC

## 2016-01-12 NOTE — Patient Instructions (Signed)
Remember to drink plenty of fluid, eat healthy meals and do not skip any meals. Try to eat protein with a every meal and eat a healthy snack such as fruit or nuts in between meals. Try to keep a regular sleep-wake schedule and try to exercise daily, particularly in the form of walking, 20-30 minutes a day, if you can.   As far as your medications are concerned, I would like to suggest: Trial of Onzetra and Cambia. Start Namenda 10mg  daily  I would like to see you back in 3 months, sooner if we need to. Please call us with any interim questions, concerns, problems, updates or refill requests.   Our phone number is [phone]. We also have an after hours call service for urgent matters and there is a physician on-call for urgent questions. For any emergencies you know to call 911 or go to the nearest emergency room
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
## 2016-01-13 ENCOUNTER — Encounter: Payer: Self-pay | Admitting: *Deleted

## 2016-01-13 MED ORDER — SUMATRIPTAN SUCCINATE 11 MG/NOSEPC NA EXHP: 1.0000 | INHALANT_POWDER | Freq: Once | NASAL | Status: DC

## 2016-01-13 NOTE — Progress Notes (Signed)
Faxed onzetra enrollment form to [facility]). Fax: [phone]. Received confirmation.

## 2016-01-13 NOTE — Progress Notes (Signed)
Received update from [place] ([facility]  She verified pt benefits and Onzetra copay for 30 days is about 60 dollars. Copay card will bring down first fill to $0 and about $25 dollars for each fill after that.  I faxed rx [place] to [facility] so she can complete benefit verification and prior auth and then send rx to pt pharmacy. Fax: [phone].  Received fax confirmation.

## 2016-01-13 NOTE — Progress Notes (Signed)
Received response back from [facility] that they received referral and are processing request.  Phone for questions: [phone]

## 2016-01-14 ENCOUNTER — Telehealth: Payer: Self-pay | Admitting: Cardiovascular Disease

## 2016-01-14 NOTE — Telephone Encounter (Signed)
Pt returned your call  Please call her back  Thank!

## 2016-01-14 NOTE — Telephone Encounter (Signed)
Spoke to patient. She is having a procedure w/ minimal sedation for hydrodistension of bladder.  Noted she did not need a cardiac clearance per surgical scheduler, but they wanted to make sure she didn't need any changes to meds.  Reason for her call is that she went off bystolic 2 weeks ago. Explains that she'd had meds adjusted by PCP.  Noted she'd not recently had any problems w/ palpitations, which was the initial reason for having been prescribed the med.  Since going on the bystolic her BPs had been running low and she'd been more symptomatic w/ this. Pt asking if any changes recommended to medication regimen. I advised likely no, would confirm w physician.  We discussed her current meds and I noted she is on amlodipine. Mentioned that if her palpitations did return, might be suitable to return to taking the bystolic and reducing/stopping the amlodipine, but we can likely wait to address.  OK to remain off bystolic for now? Routed to [employer]

## 2016-01-14 NOTE — Telephone Encounter (Signed)
Pt previously instructed me to leave her a detailed voice mail if she did not answer. Returned call to patient, and gave recommendations via phone message. Advised to call if further questions.

## 2016-01-14 NOTE — Telephone Encounter (Signed)
Left pt msg to call. 

## 2016-01-14 NOTE — Telephone Encounter (Signed)
I don't think that would be any problem. No need for med changes

## 2016-01-14 NOTE — Telephone Encounter (Signed)
New message      Pt has been off of her bystolic for about 2 weeks.  She has not had any palpitations since that time.  Last recorded bp was 123/75 taken last wednesdy.  She is having minor surgery on Tuesday with IV sedation and wanted to know if she should restart her bystolic for that?  Please call

## 2016-02-10 ENCOUNTER — Ambulatory Visit: Payer: No Typology Code available for payment source | Admitting: Family Medicine

## 2016-02-14 ENCOUNTER — Telehealth: Payer: Self-pay | Admitting: Family Medicine

## 2016-02-14 NOTE — Telephone Encounter (Signed)
Patient requesting to speak to Dr. Wendy Poet. Patient states she has a boil in her vaginal area and would like to speak to MD for advice on how to treat.

## 2016-02-15 ENCOUNTER — Encounter: Payer: Self-pay | Admitting: Family Medicine

## 2016-02-15 DIAGNOSIS — Z8719 Personal history of other diseases of the digestive system: Secondary | ICD-10-CM

## 2016-02-15 NOTE — Progress Notes (Signed)
Office visit for change in bowel habits / rectal bleeding on 02/09/16 History of IBS Assessment 1. Unspecified diarrhea 2. Hemorrhoids of anus and rectum 3. Hxof colon polyps  Plan Start colestipol tab, 1 g, 2 tab bid.  Consider Viberzi.

## 2016-02-15 NOTE — Telephone Encounter (Signed)
Ms Rodick reports a boil or blister that has opened in around perineal body area.

## 2016-02-17 ENCOUNTER — Other Ambulatory Visit: Payer: Self-pay | Admitting: Family Medicine

## 2016-02-24 ENCOUNTER — Ambulatory Visit (INDEPENDENT_AMBULATORY_CARE_PROVIDER_SITE_OTHER): Payer: No Typology Code available for payment source | Admitting: Family Medicine

## 2016-02-24 ENCOUNTER — Encounter: Payer: Self-pay | Admitting: Family Medicine

## 2016-02-24 VITALS — BP 112/80 | HR 83 | Temp 98.0°F | Ht 67.0 in | Wt 208.0 lb

## 2016-02-24 DIAGNOSIS — E038 Other specified hypothyroidism: Secondary | ICD-10-CM | POA: Diagnosis not present

## 2016-02-24 DIAGNOSIS — M797 Fibromyalgia: Secondary | ICD-10-CM

## 2016-02-24 DIAGNOSIS — Z8669 Personal history of other diseases of the nervous system and sense organs: Secondary | ICD-10-CM

## 2016-02-24 DIAGNOSIS — N3010 Interstitial cystitis (chronic) without hematuria: Secondary | ICD-10-CM

## 2016-02-24 DIAGNOSIS — Z79899 Other long term (current) drug therapy: Secondary | ICD-10-CM

## 2016-02-24 DIAGNOSIS — H1010 Acute atopic conjunctivitis, unspecified eye: Secondary | ICD-10-CM

## 2016-02-24 DIAGNOSIS — I10 Essential (primary) hypertension: Secondary | ICD-10-CM

## 2016-02-24 DIAGNOSIS — H8101 Meniere's disease, right ear: Secondary | ICD-10-CM

## 2016-02-24 DIAGNOSIS — Z23 Encounter for immunization: Secondary | ICD-10-CM

## 2016-02-24 DIAGNOSIS — IMO0002 Reserved for concepts with insufficient information to code with codable children: Secondary | ICD-10-CM

## 2016-02-24 DIAGNOSIS — F0781 Postconcussional syndrome: Secondary | ICD-10-CM

## 2016-02-24 DIAGNOSIS — G43709 Chronic migraine without aura, not intractable, without status migrainosus: Secondary | ICD-10-CM

## 2016-02-24 DIAGNOSIS — F32A Depression, unspecified: Secondary | ICD-10-CM

## 2016-02-24 DIAGNOSIS — F329 Major depressive disorder, single episode, unspecified: Secondary | ICD-10-CM

## 2016-02-24 DIAGNOSIS — Z8719 Personal history of other diseases of the digestive system: Secondary | ICD-10-CM

## 2016-02-24 DIAGNOSIS — J309 Allergic rhinitis, unspecified: Secondary | ICD-10-CM

## 2016-02-24 DIAGNOSIS — E785 Hyperlipidemia, unspecified: Secondary | ICD-10-CM

## 2016-02-24 DIAGNOSIS — Z8619 Personal history of other infectious and parasitic diseases: Secondary | ICD-10-CM

## 2016-02-24 DIAGNOSIS — I517 Cardiomegaly: Secondary | ICD-10-CM

## 2016-02-24 NOTE — Patient Instructions (Addendum)
Bood pressure Looks good, keep taking your blood pressure medications.   Look into Duloxetine (Cymbalta) at medlineplus.gov to see if you would like to try it.    We are checking your Thyroid Stimulating Hormone (TSH) level today to see if your Synthroid medication is supplementing you adequately at its current dose.  Dr Casaundra Takacs will refill your Synthroid once he sees that your TSH level is in the desired.    Your blood sugar, electrolytes and kidney function is being tested today.   Dr Twisha Vanpelt will call you if your tests are not good. Otherwise he will send you a letter.  If you sign up for MyChart online, you will be able to see your test results once Dr Pharrah Rottman has reviewed them.  If you do not hear from [name] with in 2 weeks please call our office  Try a soap called Chlorhexadine (Hibiclens or Physoderm are brand names), use it on skin area(s) of concern for one week then twice weekly indefinitely to prevent staph skin boils.

## 2016-02-25 ENCOUNTER — Encounter: Payer: Self-pay | Admitting: Family Medicine

## 2016-02-25 DIAGNOSIS — G43709 Chronic migraine without aura, not intractable, without status migrainosus: Secondary | ICD-10-CM | POA: Insufficient documentation

## 2016-02-25 DIAGNOSIS — I517 Cardiomegaly: Secondary | ICD-10-CM | POA: Insufficient documentation

## 2016-02-25 DIAGNOSIS — IMO0002 Reserved for concepts with insufficient information to code with codable children: Secondary | ICD-10-CM | POA: Insufficient documentation

## 2016-02-25 HISTORY — DX: Cardiomegaly: I51.7

## 2016-02-25 HISTORY — DX: Reserved for concepts with insufficient information to code with codable children: IMO0002

## 2016-02-25 LAB — BASIC METABOLIC PANEL WITH GFR
BUN: 11 mg/dL (ref 7–25)
CALCIUM: 9.4 mg/dL (ref 8.6–10.4)
CHLORIDE: 104 mmol/L (ref 98–110)
CO2: 24 mmol/L (ref 20–31)
CREATININE: 0.74 mg/dL (ref 0.50–0.99)
GFR, Est African American: >89 mL/min (ref >=60)
GFR, Est Non African American: 88 mL/min (ref >=60)
Glucose, Bld: 77 mg/dL (ref 65–99)
Potassium: 4.4 mmol/L (ref 3.5–5.3)
Sodium: 135 mmol/L (ref 135–146)

## 2016-02-25 LAB — TSH: TSH: 3.7 m[IU]/L

## 2016-02-25 MED ORDER — LEVOTHYROXINE SODIUM 125 MCG PO TABS: ORAL_TABLET | ORAL | Status: DC

## 2016-02-25 NOTE — Assessment & Plan Note (Signed)
Lab Results  Component Value Date   LDLCALC 77 12/14/2014  Established problem Controlled, At goal.  Continue Crestor 10 mg at bedtime

## 2016-02-25 NOTE — Assessment & Plan Note (Signed)
Established problem Controlled Moniotr and avoid antibiotics unless life or limb threatening and/or no other therapy is appropriate

## 2016-02-25 NOTE — Assessment & Plan Note (Signed)
Established problem Controlled Continue Celexa 20 mg daily Discussed possible benefit of Duloxetine SNRI on weight neutrality though it is not necessarily any worse or better than Citalopram.  Molly Giles said whe wanted to look up the medication.  She will let me know if she is interested in trying a Duloxetine instead of Citalopram.

## 2016-02-25 NOTE — Assessment & Plan Note (Signed)
Established problem. Stable. Continue current therapy.  Pt no longer on topical ophth corticosteroids from [facility].

## 2016-02-25 NOTE — Progress Notes (Signed)
Patient ID: Molly Giles, female   DOB: [birth year], 60 y.o.   MRN: [id number]   Subjective:    Patient ID: Molly Giles, female    DOB: [birth year], 60 y.o.   MRN: [id number] I reviewed old medical records that Molly Giles brought with her and I reviewed the EPIC EMR record for Molly Giles.   Skin Lesion  - onset several weeks ago - located at left lower, medial buttock - Patient has been applying warm compresses and OTC "Boil-Ease" salve. 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     Depression        This is a chronic problem.  The current episode started more than 1 year ago.   The onset quality is undetermined.   The problem occurs every several days.The problem is unchanged.  Associated symptoms include decreased concentration, fatigue and insomnia.     The symptoms are aggravated by work stress.  Past treatments include SSRIs - Selective serotonin reuptake inhibitors.  Compliance with treatment is good.  Previous treatment provided moderate relief.  Past medical history includes fibromyalgia, hypothyroidism, thyroid problem and head trauma.   Thyroid Problem Symptoms include fatigue.  Molly Giles has noted weight gain and is interested in discussing antidepressant that may be associated with less weight gain.  Pt did not tolerate Bupropion in past because of increased anxiety with it.   Problem List Items Addressed This Visit      Cardiovascular and Mediastinum   Essential hypertension CHRONIC HYPERTENSION  Disease Monitoring  Blood pressure range: not checking at home  Chest pain: no   Dyspnea: no   Claudication: no   Medication compliance: yes  Medication Side Effects  Lightheadedness:no  Urinary frequency: no   Edema: no  Preventitive  Healthcare:  Exercise: no     I reviewed ECHOcardiogram report 06/17/15 showing EF55-60%, mild LVH and G1DD     Endocrine   Hypothyroidism Hypothyroidism Longstanding issue for patient Patient presents for evaluation of thyroid function.  Symptoms consisted of palpitations  The symptoms are no.   The problem has been stable.   Previous thyroid studies include TSH.  The hypothyroidism is due to hypothyroidism and autoimmune thyroid condition.     Relevant Orders   TSH   Problem List Items Addressed This Visit    Respiratory   Allergic rhinoconjunctivitis - chronic problem -taking meds below - no change in vision, using Durezol from opthalmologist for allergic conjunctivitis per patient Using it intermittently withsymptoms.  - Adequate symptom control    Relevant Medications   montelukast (SINGULAIR) 10 MG tablet   Azelastine-Fluticasone (DYMISTA) 137-50 MCG/ACT SUSP       Nervous and Auditory   Post concussion syndrome - Onset in June after fall onto face down steps while out of country on vacation - Followed by Molly Giles (Neuro) - Nearly back to baseline.  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  Pt was a former Systems developer - Did not take full mental rest after event.  - residual symptoms of imbalance for which she has taken PT, headache (patient with preceding Chronic Migraine), slowness in cognition and decline in memory.     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, lumbar region - chronic problem - takes prn tramadol one tablet when it acts up, at most twso tablets a day when it acts up - Able to perform ADLs and iADLs    Relevant Medications   traMADol (ULTRAM) 50 MG tablet   prescribed by Molly Giles (Rheum)   methocarbamol (ROBAXIN) 500 MG tablet prescribed by Molly Giles (Rheum)      Musculoskeletal and Integument   Fibromyalgia - Longstanding issue - Managaged by Molly Giles (Rheum) with tramadol, ambien, robaxin - Stable currently - Poor sleep without ambien   Relevant Medications   traMADol (ULTRAM) 50 MG tablet   zolpidem (AMBIEN) 10 MG tablet   methocarbamol (ROBAXIN) 500 MG tablet     Genitourinary   Interstitial cystitis - Since age 63 - Manged by Molly Giles, urologist in [place] with an interest in [place]. - taking medications listed below. - Currentlyu asymptomatic.    Relevant Medications   pentosan polysulfate (ELMIRON) 100 MG capsule   Meth-Hyo-M Bl-Na Phos-Ph Sal (URIBEL) 118 MG CAPS   D-[place] POWD     Other   Palpitations - onset about a year agol [facility]  - Normal echocardiogram (I reviewed report) except mild LVH and G1DD - no palpitations. - tolerating Bystolic therapy   Relevant Medications   nebivolol (BYSTOLIC) 10 MG t- ablet   Vertigo - resolved - consulted Molly Giles (ENT) by referral from Molly Giles (neuro) for post-concussive vertigo. 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 Tolerating Crestor  - No RUQ pain No Jaundice. - No chest pain.  NO claudication   Relevant Medications   amLODipine (NORVASC) 5 MG tablet   rosuvastatin (CRESTOR) 10 MG tablet   nebivolol (BYSTOLIC) 10 MG tablet   History of migraine headaches - Onset in early 20s - Has more than 15 migraines a month Migraines response to Relpax - Molly Giles recommended a trial of Memantine for migraine prophylaxis.  Molly Hum wanted to discuss risks of this new medication with Molly Giles before starting it.  - Has tried Botox injectyions but had adverse effects.  Has tried CCB and Betablocker and TCA for prophylaxis without success. - Has not tried Valproic acid or Topamax - Has not consulted at a Headache Clinic before.    Relevant  Medications   eletriptan (RELPAX) 40 MG tablet   History of Clostridium difficile colitis, persistent - Onset this summer after four courses of Antibiotics for a cystitis.  - Kept recurring with vancomycin. Molly Graylon Good (ID) treated with facal transplant which has been successful -Pt understands importance of avoiding abx if possible,.   Depression (NOS) (Chronic) - Long standing problem - Has been on Delexa for many years.  Has not tried a trial off - PHQ9 = 10 today.    Relevant Medications   citalopram (CELEXA) 20 MG tablet    Other Visit Diagnoses      '  Past Medical History  Diagnosis Date  . Mood disorder ([place])   . Fibromyalgia   . Hypertension 2004  . Hyperlipidemia 1998  . Migraine 1962    Chronic Migraines  . Hypothyroidism   . PONV (postoperative nausea and vomiting)   . Family history of adverse reaction to anesthesia     Brother- N/V  . Dysrhythmia     seen by  Molly Wynonia Lawman- not a problem since she has been on Bystolic  . Shortness of breath dyspnea     with exertion  . Depression   . Arthritis   . Interstitial cystitis   . History of Clostridium difficile colitis 07/01/2015    Required Fecal Transplantation tocure  . Normal coronary arteries 05/14/2014  . Family history of premature CAD 05/15/2013  . H/O seasonal allergies   . Palpitations 05/15/2013    [facility] manages  . GERD (gastroesophageal reflux disease) 12/16/2014  . Hx of bad fall 02/2015    Severe Facial/head trauma without fracture  . Impairment of balance 02/2015    Consequent of postconcussive syndrome  . Benign positional vertigo 04/2015    Responded well to Vestibular Rehab  . Bruxism (teeth grinding)   . Degenerative cervical disc   . Overweight   . Thyroid nodule 08/11/2009    Findings: The thyroid gland is within normal limits in size.  The gland is diffusely inhomogeneous. A small solid nodule is noted in the lower pole  medially on the right of 7 x 6 x 8 mm. A small solid nodule is  noted inferiorly on the left of 3 x 3 x 4 mm.  IMPRESSION:  The thyroid gland is within normal limits in size with only small solid nodules present, the largest of only 8 mm in diameter on the right.    . Post concussion syndrome 06/06/2015  . 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 Hearing loss of both ears 07/27/2015    mild to borderline moderate low frequency hearing loss improving to within normal limits bilaterally on audiology testing at [facility] in November 2016.    Marland Kitchen History of irritable bowel syndrome 02/15/2016  . Musculoskeletal neck pain 07/14/2015  . Post concussive syndrome 07/14/2015    Molly Knoop's post-concussive syndrome manifesting in vertigo and headache, mood changes, poor balance, dizziness, and decreased concentration per Molly Giles at [facility].   . Allergic rhinoconjunctivitis 07/02/2015  . Chronic migraine 02/25/2016    Per Molly Giles review in notes from [facility] from September 2015. Showed total headache days last month 18. Severe headache days 7 days. Moderate headache days 5 days. Mild headache days last month sick days. Days without headache last month 10 days. Symptoms associated with photophobia, phonophobia, osmophobia, neck pain, dizziness, jaw pain, nasal congestion, vision disturbances, tingling and numbness, weakness and worsening with activity. Each headache attack last 3 hours depending on treatment in severity. Left side, the right side, easier side, the frontal area in the back of the head. Characterized as throbbing, pressure, tightness, squeezing, stabbing and burning   . Chronic migraine w/o aura w/o status migrainosus, not intractable 07/14/2015  . History of migraine headaches 05/15/2013    Per Molly Giles review in notes from [facility] from September 2015. Showed total headache days last month 18. Severe headache days 7 days. Moderate headache days 5 days. Mild headache days last month sick days. Days without  headache last month 10 days. Symptoms associated with photophobia, phonophobia, osmophobia, neck pain, dizziness, jaw pain, nasal congestion, vision disturbances, tingling and numbness, weakness and worsening with activity. Each headache attack last 3 hours depending on treatment in severity. Left side, the right side, easier side, the frontal area in the back of the head. Characterized as throbbing, pressure, tightness, squeezing, stabbing and burning    . Essential hypertension 05/15/2013  . 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 Carpometacarpal joint arthrotomy Right 2011  . Cardiovascular stress test  2000    Unremarkable per pt report  . Breast biopsy  2011    Benign histology  . Colonoscopy    . Bladder dilitation      x 3  . Colonoscopy with propofol N/A 04/21/2015    Procedure: COLONOSCOPY WITH PROPOFOL;  Surgeon: Carol Ada, MD;  Location: [place];  Service: Endoscopy;  Laterality: N/A;  . Fecal transplant  04/21/2015    Procedure: FECAL TRANSPLANT;  Surgeon: Carol Ada, MD;  Location: [facility];  Service: Endoscopy;;  . Other surgical history Left 2016    Left L3/L4 medial nerve block and Left L5 Dorsal Ramus block Molly Mickel Duhamel   SH: marked limitation in social activities in past 4 weeks. she reports that she has been passively smoking.  She has never used smokeless tobacco. She reports that she does not drink alcohol or use illicit drugs. Self-designation as poor health in the past 4 weeks.   family history includes Allergies in her sister; Alzheimer's disease in her mother; Breast cancer in her maternal aunt; Cardiomyopathy in her brother; Diabetes type II in her brother and another family member; Heart disease in her father; Hyperlipidemia in her brother, brother, father, and mother; Hypertension in her brother, father, mother, and sister; Kidney disease in her brother and  brother; Migraines in her sister; Osteoporosis in her mother; Parkinson's disease in her mother.  Testing: Language Fluency: Molly Higbie was able to name 18 different animals in 50 seconds which is well within normal range.     Review of Systems  Constitutional: Positive for fatigue.  Psychiatric/Behavioral: Positive for depression and decreased concentration. The patient has insomnia.    No change in chronic headaches No weight loss or gain (+) Low Energy and motivation All other ROS reviewed (intake sheet) and negative    Objective:   Physical Exam  VS reviewed GEN: Alert, Cooperative, Groomed, NAD HEENT: PERRL; EAC bilaterally not occluded, TM's translucent with normal LM, (+) LR;                No cervical LAN, No thyromegaly,  COR: RRR, No M/G/R, No JVD, Normal PMI size and location LUNGS: BCTA, No Acc mm use, speaking in full sentences Neuro: Oriented to person, place, and time;  Gait: Normal speed, No significant path deviation, Step through +,  Psych: Normal affect/thought/speech/language    Assessment & Plan:

## 2016-02-25 NOTE — Assessment & Plan Note (Signed)
Lab Results  Component Value Date   TSH 3.70 02/24/2016  Established problem Adequate supplementation Continue Synthroid 125 mcg daily.

## 2016-02-25 NOTE — Assessment & Plan Note (Signed)
Established problem Controlled Managed by Dr Tamala Julian ([place]) [facility] branch in [place] [address] and periodic bladder distention and instillation.

## 2016-02-25 NOTE — Assessment & Plan Note (Signed)
Established problem Uncontrollled Dr Jaynee Eagles (Neuro) recommending trial of Memantine (NMDA antagonist) as migrain prophylaxis. Given the limited adverse effects I have observed with Memantine, I recommended that Molly Giles try the medication.

## 2016-02-25 NOTE — Assessment & Plan Note (Signed)
Evidence of end organ damage from patient's essential hypertension. Will aim for BP goal less than 140/90 given her end organ changes.

## 2016-02-25 NOTE — Assessment & Plan Note (Signed)
Established problem that is stable Managed by Dr Kirke Corin (Rheum) Several of the medications in her regiment are on Beer's List as potentially inappropriate medications in older adults. Mrs Molly Giles able to name 75 different animal types in 50 seconds which is well within normal Language Fluency and a good general sign of unimpaired cognitive abilities.   Will need to monitor for adverse effects on balance and cognition as patient ages.

## 2016-02-25 NOTE — Assessment & Plan Note (Addendum)
Adequate blood pressure control.  No evidence of new end organ damage.  Tolerating medication without significant adverse effects.  Plan to continue current blood pressure regiment.  Goal BP < 140/90 given end organ damage evidenced by LVH on echocardiogram.

## 2016-02-28 ENCOUNTER — Other Ambulatory Visit: Payer: Self-pay | Admitting: Cardiovascular Disease

## 2016-03-06 ENCOUNTER — Encounter: Payer: Self-pay | Admitting: Family Medicine

## 2016-03-21 HISTORY — PX: EPIDURAL BLOCK INJECTION: SHX1516

## 2016-03-31 ENCOUNTER — Telehealth: Payer: Self-pay | Admitting: Rehabilitative and Restorative Service Providers"

## 2016-03-31 NOTE — Telephone Encounter (Signed)
Left message returning message from patient re: neck discomfort.

## 2016-04-03 ENCOUNTER — Other Ambulatory Visit: Payer: Self-pay | Admitting: Family Medicine

## 2016-04-03 DIAGNOSIS — K580 Irritable bowel syndrome with diarrhea: Secondary | ICD-10-CM

## 2016-04-03 HISTORY — DX: Irritable bowel syndrome with diarrhea: K58.0

## 2016-04-06 ENCOUNTER — Other Ambulatory Visit: Payer: Self-pay | Admitting: Gastroenterology

## 2016-04-06 DIAGNOSIS — K611 Rectal abscess: Secondary | ICD-10-CM

## 2016-04-12 ENCOUNTER — Ambulatory Visit
Admission: RE | Admit: 2016-04-12 | Discharge: 2016-04-12 | Disposition: A | Discharge status: Home / Self Care | Payer: No Typology Code available for payment source | Source: Ambulatory Visit | Attending: Gastroenterology | Admitting: Gastroenterology

## 2016-04-12 DIAGNOSIS — K611 Rectal abscess: Secondary | ICD-10-CM

## 2016-04-12 HISTORY — PX: EPIDURAL BLOCK INJECTION: SHX1516

## 2016-04-12 MED ORDER — GADOBENATE DIMEGLUMINE 529 MG/ML IV SOLN
20.0000 mL | Freq: Once | INTRAVENOUS | Status: AC | PRN
  Administered 2016-04-12: 20 mL via INTRAVENOUS

## 2016-04-25 ENCOUNTER — Encounter: Payer: Self-pay | Admitting: Family Medicine

## 2016-05-07 DIAGNOSIS — R1084 Generalized abdominal pain: Secondary | ICD-10-CM | POA: Insufficient documentation

## 2016-05-07 DIAGNOSIS — K6289 Other specified diseases of anus and rectum: Secondary | ICD-10-CM

## 2016-05-07 HISTORY — DX: Generalized abdominal pain: R10.84

## 2016-05-07 HISTORY — DX: Other specified diseases of anus and rectum: K62.89

## 2016-05-18 ENCOUNTER — Telehealth: Payer: Self-pay | Admitting: Neurology

## 2016-05-18 NOTE — Telephone Encounter (Signed)
Called patient to see if she would like to continue with botox injections. Left a VM asking her to return my call.

## 2016-05-18 NOTE — Telephone Encounter (Signed)
Patient called back and said she did not have success with botox injections.

## 2016-06-27 ENCOUNTER — Telehealth: Payer: Self-pay | Admitting: *Deleted

## 2016-06-27 NOTE — Telephone Encounter (Signed)
Request for surgical clearance:  1. What type of surgery is being performed? THA w/wo autograft/allograft   2. When is this surgery scheduled? 07/25/2016   3. Are there any medications that need to be held prior to surgery and how long?    4. Name of physician performing surgery? Dr Salli Quarry    5. What is your office phone and fax number? Phone [phone] Fax [phone]

## 2016-06-29 ENCOUNTER — Telehealth: Payer: Self-pay | Admitting: Family Medicine

## 2016-06-29 NOTE — Telephone Encounter (Signed)
Surgeon's office form dropped off for at front desk for completion.  Verified that patient section of form has been completed.  Last DOS with PCP was .  Placed form in team folder to be completed by clinical staff.  Also, he asks fax form to Derl Barrow at [phone].  Rosa A Charlies Constable

## 2016-06-29 NOTE — Telephone Encounter (Signed)
Not sure if pt needs an apt for form to be completed? Form placed in providers box for review. Please let me know if pt needs an apt.

## 2016-06-30 NOTE — Telephone Encounter (Signed)
Patient informed that Pre-Op form faxed to Derl Barrow at [phone].  Original copy placed up front for pickup.  Derl Barrow, RN

## 2016-07-02 NOTE — Telephone Encounter (Signed)
OK for surgery; no need to hold any meds

## 2016-07-04 NOTE — Telephone Encounter (Signed)
Sent via Epic to Dr Alvan Dame

## 2016-07-05 ENCOUNTER — Ambulatory Visit: Payer: No Typology Code available for payment source | Admitting: Rheumatology

## 2016-07-06 ENCOUNTER — Other Ambulatory Visit: Payer: Self-pay | Admitting: Rheumatology

## 2016-07-06 DIAGNOSIS — M797 Fibromyalgia: Secondary | ICD-10-CM

## 2016-07-07 NOTE — Telephone Encounter (Signed)
Last visit 12/28/15 Next visit 09/07/16 Ok to refill Ambien ?

## 2016-07-08 NOTE — Telephone Encounter (Signed)
Ok to refill Ambien   (I don't have access to pt's [place] chart for review).

## 2016-07-10 MED ORDER — ZOLPIDEM TARTRATE 10 MG PO TABS: 10.0000 mg | ORAL_TABLET | Freq: Every evening | ORAL | 0 refills | Status: DC | PRN

## 2016-07-10 NOTE — Telephone Encounter (Signed)
Last refill was April 2017 Per my recent review on srs Ok to refill

## 2016-07-10 NOTE — Addendum Note (Signed)
Addended byCandice Camp on: 07/10/2016 05:21 PM   Modules accepted: Orders

## 2016-07-10 NOTE — Telephone Encounter (Signed)
Yes you sent in 6 mo. Supply in April, makes it due now.

## 2016-07-12 NOTE — H&P (Signed)
TOTAL HIP ADMISSION H&P  Patient is admitted for left total hip arthroplasty, anterior approach.  Subjective:  Chief Complaint:   Left hip primary OA / pain  HPI: Molly Giles, 60 y.o. female, has a history of pain and functional disability in the left hip(s) due to arthritis and patient has failed non-surgical conservative treatments for greater than 12 weeks to include NSAID's and/or analgesics, corticosteriod injections and activity modification.  Onset of symptoms was gradual starting >10 years ago with gradually worsening course since that time.The patient noted no past surgery on the left hip(s).  Patient currently rates pain in the left hip at 9 out of 10 with activity. Patient has worsening of pain with activity and weight bearing, trendelenberg gait, pain that interfers with activities of daily living and pain with passive range of motion. Patient has evidence of periarticular osteophytes and joint space narrowing by imaging studies. This condition presents safety issues increasing the risk of falls.  There is no current active infection.   Risks, benefits and expectations were discussed with the patient.  Risks including but not limited to the risk of anesthesia, blood clots, nerve damage, blood vessel damage, failure of the prosthesis, infection and up to and including death.  Patient understand the risks, benefits and expectations and wishes to proceed with surgery.   PCP: Molly D, MD  Giles/C Plans:      Home  Post-op Meds:       No Rx given  Tranexamic Acid:      To be given - IV   Decadron:      Is to be given  FYI:     ASA  Norco (ok per pt)  Dilaudid IV (ok per pt)    Patient Active Problem List   Diagnosis Date Noted  . Irritable bowel syndrome with diarrhea 04/03/2016  . Chronic migraine 02/25/2016  . Left ventricular hypertrophy, mild 02/25/2016  . History of irritable bowel syndrome 02/15/2016  . 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 Osteoarthritis of  left hip 11/01/2015  . Hearing loss of both ears 07/27/2015  . Post concussive syndrome 07/14/2015  . Musculoskeletal neck pain 07/14/2015  . Depression (NOS) 07/02/2015  . Allergic rhinoconjunctivitis 07/02/2015  . Fibromyalgia 07/01/2015  . History of Clostridium difficile colitis, persistent 07/01/2015  . Impairment of balance 02/10/2015  . Essential hypertension 05/15/2013  . Hypothyroidism 05/15/2013  . Hyperlipidemia 05/15/2013  . Interstitial cystitis 05/15/2013  . History of migraine headaches 05/15/2013  . 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 Allergic rhinoconjunctivitis 07/02/2015  . Arthritis   . Benign positional vertigo 04/2015   Responded well to [facility]  . Bruxism (teeth grinding)   . Chronic migraine 02/25/2016   Per Dr Jaynee Eagles review in notes from [facility] from September 2015. Showed total headache days last month 18. Severe headache days 7 days. Moderate headache days 5 days. Mild headache days last month sick days. Days without headache last month 10 days. Symptoms associated with photophobia, phonophobia, osmophobia, neck pain, dizziness, jaw pain, nasal congestion, vision disturbances, tingling and numbness, weakness and worsening with activity. Each headache attack last 3 hours depending on treatment in severity. Left side, the right side, easier side, the frontal area in the back of the head. Characterized as throbbing, pressure, tightness, squeezing, stabbing and burning   . Chronic migraine w/o aura w/o status migrainosus, not intractable 07/14/2015  . Degenerative  cervical disc   . Depression   . Dysrhythmia    seen by dr Wynonia Lawman- not a problem since she has been on Bystolic  . Essential hypertension 05/15/2013  . Family history of adverse reaction to anesthesia    Brother- N/V  . Family history of premature CAD 05/15/2013  . Fibromyalgia   . GERD  (gastroesophageal reflux disease) 12/16/2014  . H/O seasonal allergies   . Hearing loss of both ears 07/27/2015   mild to borderline moderate low frequency hearing loss improving to within normal limits bilaterally on audiology testing at [facility] in November 2016.    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 History of irritable bowel syndrome 02/15/2016  . History of migraine headaches 05/15/2013   Per Dr Jaynee Eagles review in notes from [facility] from September 2015. Showed total headache days last month 18. Severe headache days 7 days. Moderate headache days 5 days. Mild headache days last month sick days. Days without headache last month 10 days. Symptoms associated with photophobia, phonophobia, osmophobia, neck pain, dizziness, jaw pain, nasal congestion, vision disturbances, tingling and numbness, weakness and worsening with activity. Each headache attack last 3 hours depending on treatment in severity. Left side, the right side, easier side, the frontal area in the back of the head. Characterized as throbbing, pressure, tightness, squeezing, stabbing and burning    . Hx of bad fall 02/2015   Severe Facial/head trauma without fracture  . Hyperlipidemia 1998  . Hypertension 2004  . Hypothyroidism   . Impairment of balance 02/2015   Consequent of postconcussive syndrome  . Interstitial cystitis   . 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 Migraine 1962   Chronic Migraines  . Mood disorder ([place])   . Musculoskeletal neck pain 07/14/2015  . Normal coronary arteries 05/14/2014  . Osteoarthritis of left hip 11/01/2015   MRI order by Dr Alvan Dame (ortho) 10/2015 showed significant arthritis of left hip joint with cystic changes in femoral head c/w osteoarthritis  . 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 Overweight   . Palpitations 05/15/2013   [facility] manages  .  PONV (postoperative nausea and vomiting)   . Post concussion syndrome 06/06/2015  . Post concussive syndrome 07/14/2015   [name]'s post-concussive syndrome manifesting in vertigo and headache, mood changes, poor balance, dizziness, and decreased concentration per Dr Jaynee Eagles at [facility].   . Shortness of breath dyspnea    with exertion  . Thyroid nodule 08/11/2009   Findings: The thyroid gland is within normal limits in size.  The gland is diffusely inhomogeneous. A small solid nodule is noted in the lower pole  medially on the right of 7 x 6 x 8 mm. A small solid nodule is noted inferiorly on the left of 3 x 3 x 4 mm.  IMPRESSION:  The thyroid gland is within normal limits in size with only small solid nodules present, the largest of only 8 mm in diameter on the right.      Past Surgical History:  Procedure Laterality Date  . Bladder dilitation     x 3  . BREAST BIOPSY  2011   Benign histology  . CARDIOVASCULAR STRESS TEST  2000   Unremarkable per pt report  . CARPOMETACARPAL JOINT ARTHROTOMY Right 2011  . COLONOSCOPY    . COLONOSCOPY WITH PROPOFOL N/A 04/21/2015   Procedure: COLONOSCOPY WITH PROPOFOL;  Surgeon: Carol Ada, MD;  Location: [facility];  Service: Endoscopy;  Laterality: N/A;  . EPIDURAL BLOCK INJECTION Left 04/12/2016   Left Medial Nerve Block and Left L5 ramus block, Dr Suella Broad   . EPIDURAL BLOCK INJECTION  03/21/2016   Left L3-4 medial branch block and Left L5 & dorsal ramus block   . FECAL TRANSPLANT  04/21/2015   Procedure: FECAL TRANSPLANT;  Surgeon: Carol Ada, MD;  Location: [place];  Service: Endoscopy;;  . INJECTION HIP INTRA ARTICULAR Left 11/2015   for OA by Dr Suella Broad  . OTHER SURGICAL HISTORY Left 2016   Left L3/L4 medial nerve block and Left L5 Dorsal Ramus block Dr Mickel Duhamel    No prescriptions prior to admission.   Allergies  Allergen Reactions  . Codeine Other (See Comments)    Unknown   . Lyrica [Pregabalin] Other (See  Comments)    Dizziness   . Sulfa Antibiotics Itching  . Betadine [Povidone Iodine] Rash    burning  . Latex Rash    burning  . Wellbutrin [Bupropion] Anxiety    Social History  Substance Use Topics  . Smoking status: Passive Smoke Exposure - Never Smoker  . Smokeless tobacco: Never Used  . Alcohol use No    Family History  Problem Relation Age of Onset  . Alzheimer's disease Mother   . Hyperlipidemia Mother   . Hypertension Mother   . Osteoporosis Mother   . Parkinson's disease Mother   . Migraines Sister   . Allergies Sister   . Hyperlipidemia Brother   . Cardiomyopathy Brother   . Diabetes type II Brother   . Kidney disease Brother   . Hypertension Brother   . Hyperlipidemia Brother   . Kidney disease Brother   . Heart disease Father   . Hyperlipidemia Father   . Hypertension Father   . Aortic aneurysm Father   . Early death Father 59  . Hypertension Sister   . Diabetes type II    . Breast cancer Maternal Aunt   . Asthma Brother   . Heart disease Brother      Review of Systems  Constitutional: Negative.   HENT: Positive for hearing loss.   Eyes: Negative.   Respiratory: Negative.   Cardiovascular: Negative.   Gastrointestinal: Positive for diarrhea and heartburn.  Genitourinary: Negative.   Musculoskeletal: Positive for joint pain.  Skin: Negative.   Neurological: Positive for headaches.  Endo/Heme/Allergies: Positive for environmental allergies.  Psychiatric/Behavioral: Positive for depression.    Objective:  Physical Exam  Constitutional: She is oriented to person, place, and time. She appears well-developed.  HENT:  Head: Normocephalic.  Eyes: Pupils are equal, round, and reactive to light.  Neck: Neck supple. No JVD present. No tracheal deviation present. No thyromegaly present.  Cardiovascular: Normal rate, regular rhythm, normal heart sounds and intact distal pulses.   Respiratory: Effort normal and breath sounds normal. No respiratory  distress. She has no wheezes.  GI: Soft. There is no tenderness. There is no guarding.  Lymphadenopathy:    She has no cervical adenopathy.  Neurological: She is alert and oriented to person, place, and time.  Skin: Skin is warm and dry.  Psychiatric: She has a normal mood and affect.      Labs:  Estimated body mass index is 32.58 kg/m as calculated from the following:   Height as of 02/24/16: 5\' 7"  (1.702 m).   Weight as of 02/24/16: 94.3 kg (208 lb).   Imaging Review Plain radiographs demonstrate severe degenerative joint  disease of the left hip(s). The bone quality appears to be good for age and reported activity level.  Assessment/Plan:  End stage arthritis, left hip(s)  The patient history, physical examination, clinical judgement of the provider and imaging studies are consistent with end stage degenerative joint disease of the left hip(s) and total hip arthroplasty is deemed medically necessary. The treatment options including medical management, injection therapy, arthroscopy and arthroplasty were discussed at length. The risks and benefits of total hip arthroplasty were presented and reviewed. The risks due to aseptic loosening, infection, stiffness, dislocation/subluxation,  thromboembolic complications and other imponderables were discussed.  The patient acknowledged the explanation, agreed to proceed with the plan and consent was signed. Patient is being admitted for inpatient treatment for surgery, pain control, PT, OT, prophylactic antibiotics, VTE prophylaxis, progressive ambulation and ADL's and discharge planning.The patient is planning to be discharged home.     [place] Nandini Bogdanski   PA-C  07/12/2016, 9:10 PM

## 2016-07-17 ENCOUNTER — Encounter (HOSPITAL_COMMUNITY)
Admission: RE | Admit: 2016-07-17 | Discharge: 2016-07-17 | Disposition: A | Discharge status: Home / Self Care | Payer: BLUE CROSS/BLUE SHIELD | Source: Ambulatory Visit | Attending: Orthopedic Surgery | Admitting: Orthopedic Surgery

## 2016-07-17 ENCOUNTER — Encounter (HOSPITAL_COMMUNITY): Payer: Self-pay

## 2016-07-17 DIAGNOSIS — Z01812 Encounter for preprocedural laboratory examination: Secondary | ICD-10-CM | POA: Diagnosis present

## 2016-07-17 DIAGNOSIS — Z01810 Encounter for preprocedural cardiovascular examination: Secondary | ICD-10-CM | POA: Diagnosis present

## 2016-07-17 DIAGNOSIS — I10 Essential (primary) hypertension: Secondary | ICD-10-CM | POA: Insufficient documentation

## 2016-07-17 HISTORY — DX: Other intervertebral disc degeneration, lumbar region: M51.36

## 2016-07-17 HISTORY — DX: Other intervertebral disc degeneration, lumbar region without mention of lumbar back pain or lower extremity pain: M51.369

## 2016-07-17 LAB — CBC
HCT: 40.6 % (ref 36.0–46.0)
Hemoglobin: 13 g/dL (ref 12.0–15.0)
MCH: 29 pg (ref 26.0–34.0)
MCHC: 32 g/dL (ref 30.0–36.0)
MCV: 90.6 fL (ref 78.0–100.0)
PLATELETS: 294 10*3/uL (ref 150–400)
RBC: 4.48 MIL/uL (ref 3.87–5.11)
RDW: 14.9 % (ref 11.5–15.5)
WBC: 8.2 10*3/uL (ref 4.0–10.5)

## 2016-07-17 LAB — BASIC METABOLIC PANEL
Anion gap: 5 (ref 5–15)
BUN: 15 mg/dL (ref 6–20)
CHLORIDE: 105 mmol/L (ref 101–111)
CO2: 29 mmol/L (ref 22–32)
CREATININE: 0.72 mg/dL (ref 0.44–1.00)
Calcium: 9.4 mg/dL (ref 8.9–10.3)
GFR calc Af Amer: >60 mL/min (ref >60)
GLUCOSE: 98 mg/dL (ref 65–99)
POTASSIUM: 3.9 mmol/L (ref 3.5–5.1)
SODIUM: 139 mmol/L (ref 135–145)

## 2016-07-17 LAB — ABO/RH: ABO/RH(D): O POS

## 2016-07-17 NOTE — Patient Instructions (Signed)
DILANA MUKHERJEE  07/17/2016   Your procedure is scheduled on: 07/25/16  Report to [facility]  Entrance take [place]  elevators to 3rd floor to  [place] at 0540 AM.  Call this number if you have problems the morning of surgery [phone]   Remember: ONLY 1 PERSON MAY GO WITH YOU TO SHORT STAY TO GET  READY MORNING OF [place].  Do not eat food or drink liquids :After Midnight.     Take these medicines the morning of surgery with A SIP OF WATER:  Claritin, Prednisone acetete eye drops May take Relpax, Tramadol if needed      May use Dymista if needed DO NOT TAKE ANY DIABETIC MEDICATIONS DAY OF YOUR SURGERY                               You may not have any metal on your body including hair pins and              piercings  Do not wear jewelry, make-up, lotions, powders or perfumes, deodorant             Do not wear nail polish.  Do not shave  48 hours prior to surgery.              Men may shave face and neck.   Do not bring valuables to the hospital. [place].  Contacts, dentures or bridgework may not be worn into surgery.  Leave suitcase in the car. After surgery it may be brought to your room.                 Please read over the following fact sheets you were given: _____________________________________________________________________             [facility] - Preparing for Surgery       ALL AREAS THAT SAY HIBICLINS SOAP---SUBSTITUTE DIAL ANTIBACTERIAL SOAP Before surgery, you can play an important role.  Because skin is not sterile, your skin needs to be as free of germs as possible.  You can reduce the number of germs on your skin by washing with CHG (chlorahexidine gluconate) soap before surgery.  CHG is an antiseptic cleaner which kills germs and bonds with the skin to continue killing germs even after washing. Please DO NOT use if you have an allergy to CHG or antibacterial soaps.  If  your skin becomes reddened/irritated stop using the CHG and inform your nurse when you arrive at Short Stay. Do not shave (including legs and underarms) for at least 48 hours prior to the first CHG shower.  You may shave your face/neck. Please follow these instructions carefully:  1.  Shower with CHG Soap the night before surgery and the  morning of Surgery.  2.  If you choose to wash your hair, wash your hair first as usual with your  normal  shampoo.  3.  After you shampoo, rinse your hair and body thoroughly to remove the  shampoo.                           4.  Use CHG as you would any other liquid soap.  You can apply  chg directly  to the skin and wash                       Gently with a scrungie or clean washcloth.  5.  Apply the CHG Soap to your body ONLY FROM THE NECK DOWN.   Do not use on face/ open                           Wound or open sores. Avoid contact with eyes, ears mouth and genitals (private parts).                       Wash face,  Genitals (private parts) with your normal soap.             6.  Wash thoroughly, paying special attention to the area where your surgery  will be performed.  7.  Thoroughly rinse your body with warm water from the neck down.  8.  DO NOT shower/wash with your normal soap after using and rinsing off  the CHG Soap.                9.  Pat yourself dry with a clean towel.            10.  Wear clean pajamas.            11.  Place clean sheets on your bed the night of your first shower and do not  sleep with pets. Day of Surgery : Do not apply any lotions/deodorants the morning of surgery.  Please wear clean clothes to the hospital/surgery center.  FAILURE TO FOLLOW THESE INSTRUCTIONS MAY RESULT IN THE CANCELLATION OF YOUR SURGERY PATIENT SIGNATURE_________________________________  NURSE SIGNATURE__________________________________  ________________________________________________________________________   Adam Phenix  An incentive  spirometer is a tool that can help keep your lungs clear and active. This tool measures how well you are filling your lungs with each breath. Taking long deep breaths may help reverse or decrease the chance of developing breathing (pulmonary) problems (especially infection) following:  A long period of time when you are unable to move or be active. BEFORE THE PROCEDURE   If the spirometer includes an indicator to show your best effort, your nurse or respiratory therapist will set it to a desired goal.  If possible, sit up straight or lean slightly forward. Try not to slouch.  Hold the incentive spirometer in an upright position. INSTRUCTIONS FOR USE  1. Sit on the edge of your bed if possible, or sit up as far as you can in bed or on a chair. 2. Hold the incentive spirometer in an upright position. 3. Breathe out normally. 4. Place the mouthpiece in your mouth and seal your lips tightly around it. 5. Breathe in slowly and as deeply as possible, raising the piston or the ball toward the top of the column. 6. Hold your breath for 3-5 seconds or for as long as possible. Allow the piston or ball to fall to the bottom of the column. 7. Remove the mouthpiece from your mouth and breathe out normally. 8. Rest for a few seconds and repeat Steps 1 through 7 at least 10 times every 1-2 hours when you are awake. Take your time and take a few normal breaths between deep breaths. 9. The spirometer may include an indicator to show your best effort. Use the indicator as a goal to work toward during each repetition.  10. After each set of 10 deep breaths, practice coughing to be sure your lungs are clear. If you have an incision (the cut made at the time of surgery), support your incision when coughing by placing a pillow or rolled up towels firmly against it. Once you are able to get out of bed, walk around indoors and cough well. You may stop using the incentive spirometer when instructed by your caregiver.   RISKS AND COMPLICATIONS  Take your time so you do not get dizzy or light-headed.  If you are in pain, you may need to take or ask for pain medication before doing incentive spirometry. It is harder to take a deep breath if you are having pain. AFTER USE  Rest and breathe slowly and easily.  It can be helpful to keep track of a log of your progress. Your caregiver can provide you with a simple table to help with this. If you are using the spirometer at home, follow these instructions: [place] IF:   You are having difficultly using the spirometer.  You have trouble using the spirometer as often as instructed.  Your pain medication is not giving enough relief while using the spirometer.  You develop fever of 100.5 F (38.1 C) or higher. SEEK IMMEDIATE MEDICAL CARE IF:   You cough up bloody sputum that had not been present before.  You develop fever of 102 F (38.9 C) or greater.  You develop worsening pain at or near the incision site. MAKE SURE YOU:   Understand these instructions.  Will watch your condition.  Will get help right away if you are not doing well or get worse. Document Released: 01/08/2007 Document Revised: 11/20/2011 Document Reviewed: 03/11/2007 ExitCare Patient Information 2014 Exit[employer], [place].   ________________________________________________________________________  WHAT IS A BLOOD TRANSFUSION? Blood Transfusion Information  A transfusion is the replacement of blood or some of its parts. Blood is made up of multiple cells which provide different functions.  Red blood cells carry oxygen and are used for blood loss replacement.  White blood cells fight against infection.  Platelets control bleeding.  Plasma helps clot blood.  Other blood products are available for specialized needs, such as hemophilia or other clotting disorders. BEFORE THE TRANSFUSION  Who gives blood for transfusions?   Healthy volunteers who are fully evaluated  to make sure their blood is safe. This is blood bank blood. Transfusion therapy is the safest it has ever been in the practice of medicine. Before blood is taken from a donor, a complete history is taken to make sure that person has no history of diseases nor engages in risky social behavior (examples are intravenous drug use or sexual activity with multiple partners). The donor's travel history is screened to minimize risk of transmitting infections, such as malaria. The donated blood is tested for signs of infectious diseases, such as HIV and hepatitis. The blood is then tested to be sure it is compatible with you in order to minimize the chance of a transfusion reaction. If you or a relative donates blood, this is often done in anticipation of surgery and is not appropriate for emergency situations. It takes many days to process the donated blood. RISKS AND COMPLICATIONS Although transfusion therapy is very safe and saves many lives, the main dangers of transfusion include:   Getting an infectious disease.  Developing a transfusion reaction. This is an allergic reaction to something in the blood you were given. Every precaution is taken to prevent this. The  decision to have a blood transfusion has been considered carefully by your caregiver before blood is given. Blood is not given unless the benefits outweigh the risks. AFTER THE TRANSFUSION  Right after receiving a blood transfusion, you will usually feel much better and more energetic. This is especially true if your red blood cells have gotten low (anemic). The transfusion raises the level of the red blood cells which carry oxygen, and this usually causes an energy increase.  The nurse administering the transfusion will monitor you carefully for complications. HOME CARE INSTRUCTIONS  No special instructions are needed after a transfusion. You may find your energy is better. Speak with your caregiver about any limitations on activity for  underlying diseases you may have. SEEK MEDICAL CARE IF:   Your condition is not improving after your transfusion.  You develop redness or irritation at the intravenous (IV) site. SEEK IMMEDIATE MEDICAL CARE IF:  Any of the following symptoms occur over the next 12 hours:  Shaking chills.  You have a temperature by mouth above 102 F (38.9 C), not controlled by medicine.  Chest, back, or muscle pain.  People around you feel you are not acting correctly or are confused.  Shortness of breath or difficulty breathing.  Dizziness and fainting.  You get a rash or develop hives.  You have a decrease in urine output.  Your urine turns a dark color or changes to pink, red, or brown. Any of the following symptoms occur over the next 10 days:  You have a temperature by mouth above 102 F (38.9 C), not controlled by medicine.  Shortness of breath.  Weakness after normal activity.  The white part of the eye turns yellow (jaundice).  You have a decrease in the amount of urine or are urinating less often.  Your urine turns a dark color or changes to pink, red, or brown. Document Released: 08/25/2000 Document Revised: 11/20/2011 Document Reviewed: 04/13/2008 [facility] Patient Information 2014 [place], [place].  _______________________________________________________________________

## 2016-07-25 ENCOUNTER — Inpatient Hospital Stay (HOSPITAL_COMMUNITY): Payer: BLUE CROSS/BLUE SHIELD

## 2016-07-25 ENCOUNTER — Encounter (HOSPITAL_COMMUNITY): Payer: Self-pay

## 2016-07-25 ENCOUNTER — Encounter (HOSPITAL_COMMUNITY)
Admission: RE | Disposition: A | Discharge status: Home Health | Payer: Self-pay | Source: Ambulatory Visit | Attending: Orthopedic Surgery

## 2016-07-25 ENCOUNTER — Inpatient Hospital Stay (HOSPITAL_COMMUNITY)
Admission: RE | Admit: 2016-07-25 | Discharge: 2016-07-26 | DRG: 470 | Disposition: A | Discharge status: Home Health | Payer: BLUE CROSS/BLUE SHIELD | Source: Ambulatory Visit | Attending: Orthopedic Surgery | Admitting: Orthopedic Surgery

## 2016-07-25 ENCOUNTER — Inpatient Hospital Stay (HOSPITAL_COMMUNITY): Payer: BLUE CROSS/BLUE SHIELD | Admitting: Certified Registered"

## 2016-07-25 DIAGNOSIS — M1612 Unilateral primary osteoarthritis, left hip: Principal | ICD-10-CM | POA: Diagnosis present

## 2016-07-25 DIAGNOSIS — M797 Fibromyalgia: Secondary | ICD-10-CM | POA: Diagnosis present

## 2016-07-25 DIAGNOSIS — Z7722 Contact with and (suspected) exposure to environmental tobacco smoke (acute) (chronic): Secondary | ICD-10-CM | POA: Diagnosis present

## 2016-07-25 DIAGNOSIS — Z96649 Presence of unspecified artificial hip joint: Secondary | ICD-10-CM

## 2016-07-25 DIAGNOSIS — E039 Hypothyroidism, unspecified: Secondary | ICD-10-CM | POA: Diagnosis present

## 2016-07-25 DIAGNOSIS — M25552 Pain in left hip: Secondary | ICD-10-CM | POA: Diagnosis present

## 2016-07-25 DIAGNOSIS — I10 Essential (primary) hypertension: Secondary | ICD-10-CM | POA: Diagnosis present

## 2016-07-25 DIAGNOSIS — H9193 Unspecified hearing loss, bilateral: Secondary | ICD-10-CM | POA: Diagnosis present

## 2016-07-25 DIAGNOSIS — F329 Major depressive disorder, single episode, unspecified: Secondary | ICD-10-CM | POA: Diagnosis present

## 2016-07-25 DIAGNOSIS — E785 Hyperlipidemia, unspecified: Secondary | ICD-10-CM | POA: Diagnosis present

## 2016-07-25 HISTORY — PX: TOTAL HIP ARTHROPLASTY: SHX124

## 2016-07-25 HISTORY — DX: Presence of unspecified artificial hip joint: Z96.649

## 2016-07-25 LAB — TYPE AND SCREEN
ABO/RH(D): O POS
ANTIBODY SCREEN: NEGATIVE

## 2016-07-25 LAB — SURGICAL PCR SCREEN
MRSA, PCR: NEGATIVE
STAPHYLOCOCCUS AUREUS: NEGATIVE

## 2016-07-25 SURGERY — ARTHROPLASTY, HIP, TOTAL, ANTERIOR APPROACH
Anesthesia: Monitor Anesthesia Care | Site: Hip | Laterality: Left

## 2016-07-25 MED ORDER — MIDAZOLAM HCL 2 MG/2ML IJ SOLN
INTRAMUSCULAR | Status: AC
  Filled 2016-07-25: qty 2

## 2016-07-25 MED ORDER — DIPHENHYDRAMINE HCL 25 MG PO CAPS: 25.0000 mg | ORAL_CAPSULE | Freq: Four times a day (QID) | ORAL | Status: DC | PRN

## 2016-07-25 MED ORDER — LIDOCAINE 2% (20 MG/ML) 5 ML SYRINGE
INTRAMUSCULAR | Status: AC
  Filled 2016-07-25: qty 5

## 2016-07-25 MED ORDER — ONDANSETRON HCL 4 MG/2ML IJ SOLN: 4.0000 mg | Freq: Four times a day (QID) | INTRAMUSCULAR | Status: DC | PRN

## 2016-07-25 MED ORDER — DEXAMETHASONE SODIUM PHOSPHATE 10 MG/ML IJ SOLN
INTRAMUSCULAR | Status: AC
  Filled 2016-07-25: qty 1

## 2016-07-25 MED ORDER — DEXAMETHASONE SODIUM PHOSPHATE 10 MG/ML IJ SOLN
10.0000 mg | Freq: Once | INTRAMUSCULAR | Status: AC
  Administered 2016-07-26: 10 mg via INTRAVENOUS
  Filled 2016-07-25: qty 1

## 2016-07-25 MED ORDER — MIDAZOLAM HCL 5 MG/5ML IJ SOLN
INTRAMUSCULAR | Status: DC | PRN
  Administered 2016-07-25: 2 mg via INTRAVENOUS

## 2016-07-25 MED ORDER — MONTELUKAST SODIUM 10 MG PO TABS
10.0000 mg | ORAL_TABLET | Freq: Every day | ORAL | Status: DC
  Administered 2016-07-25: 10 mg via ORAL
  Filled 2016-07-25: qty 1

## 2016-07-25 MED ORDER — FENTANYL CITRATE (PF) 100 MCG/2ML IJ SOLN
INTRAMUSCULAR | Status: AC
  Filled 2016-07-25: qty 2

## 2016-07-25 MED ORDER — BUPIVACAINE IN DEXTROSE 0.75-8.25 % IT SOLN
INTRATHECAL | Status: DC | PRN
  Administered 2016-07-25: 2 mL via INTRATHECAL

## 2016-07-25 MED ORDER — MEPERIDINE HCL 50 MG/ML IJ SOLN
INTRAMUSCULAR | Status: AC
  Administered 2016-07-25: 12.5 mg
  Filled 2016-07-25: qty 1

## 2016-07-25 MED ORDER — STERILE WATER FOR IRRIGATION IR SOLN
Status: DC | PRN
  Administered 2016-07-25: 2000 mL

## 2016-07-25 MED ORDER — ZOLPIDEM TARTRATE 5 MG PO TABS: 5.0000 mg | ORAL_TABLET | Freq: Every evening | ORAL | Status: DC | PRN

## 2016-07-25 MED ORDER — PROPOFOL 10 MG/ML IV BOLUS
INTRAVENOUS | Status: AC
  Filled 2016-07-25: qty 20

## 2016-07-25 MED ORDER — MENTHOL 3 MG MT LOZG: 1.0000 | LOZENGE | OROMUCOSAL | Status: DC | PRN

## 2016-07-25 MED ORDER — LACTATED RINGERS IV SOLN
INTRAVENOUS | Status: DC | PRN
  Administered 2016-07-25 (×2): via INTRAVENOUS

## 2016-07-25 MED ORDER — TRANEXAMIC ACID 1000 MG/10ML IV SOLN
1000.0000 mg | INTRAVENOUS | Status: AC
  Administered 2016-07-25: 1000 mg via INTRAVENOUS
  Filled 2016-07-25: qty 1100

## 2016-07-25 MED ORDER — ONDANSETRON HCL 4 MG/2ML IJ SOLN
INTRAMUSCULAR | Status: DC | PRN
  Administered 2016-07-25: 4 mg via INTRAVENOUS

## 2016-07-25 MED ORDER — ROSUVASTATIN CALCIUM 5 MG PO TABS
10.0000 mg | ORAL_TABLET | Freq: Every day | ORAL | Status: DC
  Administered 2016-07-25: 10 mg via ORAL
  Filled 2016-07-25: qty 2

## 2016-07-25 MED ORDER — HYDROCODONE-ACETAMINOPHEN 7.5-325 MG PO TABS
1.0000 | ORAL_TABLET | ORAL | Status: DC
  Administered 2016-07-25 (×3): 2 via ORAL
  Administered 2016-07-26: 1 via ORAL
  Administered 2016-07-26 (×3): 2 via ORAL
  Filled 2016-07-25 (×3): qty 2
  Filled 2016-07-25: qty 1
  Filled 2016-07-25 (×3): qty 2

## 2016-07-25 MED ORDER — SODIUM CHLORIDE 0.9 % IV SOLN
100.0000 mL/h | INTRAVENOUS | Status: DC
  Administered 2016-07-25: 100 mL/h via INTRAVENOUS
  Filled 2016-07-25 (×5): qty 1000

## 2016-07-25 MED ORDER — CEFAZOLIN SODIUM-DEXTROSE 2-4 GM/100ML-% IV SOLN
2.0000 g | INTRAVENOUS | Status: AC
  Administered 2016-07-25: 2 g via INTRAVENOUS

## 2016-07-25 MED ORDER — DOCUSATE SODIUM 100 MG PO CAPS
100.0000 mg | ORAL_CAPSULE | Freq: Two times a day (BID) | ORAL | Status: DC
  Administered 2016-07-25 – 2016-07-26 (×2): 100 mg via ORAL
  Filled 2016-07-25 (×2): qty 1

## 2016-07-25 MED ORDER — POLYETHYLENE GLYCOL 3350 17 G PO PACK
17.0000 g | PACK | Freq: Two times a day (BID) | ORAL | Status: DC
  Administered 2016-07-25 – 2016-07-26 (×2): 17 g via ORAL
  Filled 2016-07-25 (×2): qty 1

## 2016-07-25 MED ORDER — ASPIRIN 81 MG PO CHEW
81.0000 mg | CHEWABLE_TABLET | Freq: Two times a day (BID) | ORAL | Status: DC
  Administered 2016-07-25 – 2016-07-26 (×2): 81 mg via ORAL
  Filled 2016-07-25 (×2): qty 1

## 2016-07-25 MED ORDER — LEVOTHYROXINE SODIUM 125 MCG PO TABS
62.5000 ug | ORAL_TABLET | Freq: Every day | ORAL | Status: DC
  Administered 2016-07-25: 62.5 ug via ORAL
  Filled 2016-07-25: qty 0.5

## 2016-07-25 MED ORDER — ISOPROPYL ALCOHOL 70 % SOLN
Status: DC | PRN
  Administered 2016-07-25: 1 via TOPICAL

## 2016-07-25 MED ORDER — PROPOFOL 500 MG/50ML IV EMUL
INTRAVENOUS | Status: DC | PRN
  Administered 2016-07-25: 100 ug/kg/min via INTRAVENOUS

## 2016-07-25 MED ORDER — OXYCODONE HCL 5 MG PO TABS: 5.0000 mg | ORAL_TABLET | Freq: Once | ORAL | Status: DC | PRN

## 2016-07-25 MED ORDER — ISOPROPYL ALCOHOL 70 % SOLN
Status: AC
  Filled 2016-07-25: qty 480

## 2016-07-25 MED ORDER — PREDNISOLONE ACETATE 1 % OP SUSP
1.0000 [drp] | Freq: Every day | OPHTHALMIC | Status: DC
  Filled 2016-07-25: qty 1

## 2016-07-25 MED ORDER — HYDROMORPHONE HCL 1 MG/ML IJ SOLN
0.2500 mg | INTRAMUSCULAR | Status: DC | PRN
  Administered 2016-07-25 (×4): 0.5 mg via INTRAVENOUS

## 2016-07-25 MED ORDER — ELETRIPTAN HYDROBROMIDE 40 MG PO TABS
40.0000 mg | ORAL_TABLET | ORAL | Status: DC | PRN
  Administered 2016-07-25: 40 mg via ORAL
  Filled 2016-07-25 (×3): qty 1

## 2016-07-25 MED ORDER — AMLODIPINE BESYLATE 5 MG PO TABS
5.0000 mg | ORAL_TABLET | Freq: Every day | ORAL | Status: DC
  Administered 2016-07-25: 5 mg via ORAL
  Filled 2016-07-25: qty 1

## 2016-07-25 MED ORDER — ONDANSETRON HCL 4 MG PO TABS: 4.0000 mg | ORAL_TABLET | Freq: Four times a day (QID) | ORAL | Status: DC | PRN

## 2016-07-25 MED ORDER — ONDANSETRON HCL 4 MG/2ML IJ SOLN
INTRAMUSCULAR | Status: AC
  Filled 2016-07-25: qty 2

## 2016-07-25 MED ORDER — HYDROMORPHONE HCL 1 MG/ML IJ SOLN
INTRAMUSCULAR | Status: AC
  Administered 2016-07-25: 0.5 mg via INTRAVENOUS
  Filled 2016-07-25: qty 1

## 2016-07-25 MED ORDER — ALUM & MAG HYDROXIDE-SIMETH 200-200-20 MG/5ML PO SUSP: 30.0000 mL | ORAL | Status: DC | PRN

## 2016-07-25 MED ORDER — METOCLOPRAMIDE HCL 5 MG/ML IJ SOLN
5.0000 mg | Freq: Three times a day (TID) | INTRAMUSCULAR | Status: DC | PRN
Start: 2016-07-25 — End: 2016-07-26

## 2016-07-25 MED ORDER — DIAZEPAM 5 MG PO TABS: 5.0000 mg | ORAL_TABLET | Freq: Three times a day (TID) | ORAL | Status: DC | PRN

## 2016-07-25 MED ORDER — 0.9 % SODIUM CHLORIDE (POUR BTL) OPTIME
TOPICAL | Status: DC | PRN
  Administered 2016-07-25: 1000 mL

## 2016-07-25 MED ORDER — CEFAZOLIN SODIUM-DEXTROSE 2-4 GM/100ML-% IV SOLN
2.0000 g | Freq: Four times a day (QID) | INTRAVENOUS | Status: AC
  Administered 2016-07-25 (×2): 2 g via INTRAVENOUS
  Filled 2016-07-25 (×2): qty 100

## 2016-07-25 MED ORDER — LORATADINE 10 MG PO TABS
10.0000 mg | ORAL_TABLET | Freq: Every day | ORAL | Status: DC
  Administered 2016-07-26: 10 mg via ORAL
  Filled 2016-07-25: qty 1

## 2016-07-25 MED ORDER — DEXAMETHASONE SODIUM PHOSPHATE 10 MG/ML IJ SOLN
10.0000 mg | Freq: Once | INTRAMUSCULAR | Status: AC
  Administered 2016-07-25: 10 mg via INTRAVENOUS

## 2016-07-25 MED ORDER — HYDROMORPHONE HCL 1 MG/ML IJ SOLN
INTRAMUSCULAR | Status: AC
Start: 2016-07-25 — End: 2016-07-26
  Filled 2016-07-25: qty 1

## 2016-07-25 MED ORDER — LIDOCAINE 2% (20 MG/ML) 5 ML SYRINGE
INTRAMUSCULAR | Status: DC | PRN
  Administered 2016-07-25: 20 mg via INTRAVENOUS

## 2016-07-25 MED ORDER — METHOCARBAMOL 1000 MG/10ML IJ SOLN
500.0000 mg | Freq: Four times a day (QID) | INTRAMUSCULAR | Status: DC | PRN
  Administered 2016-07-25: 500 mg via INTRAVENOUS
  Filled 2016-07-25: qty 550
  Filled 2016-07-25: qty 5

## 2016-07-25 MED ORDER — MAGNESIUM CITRATE PO SOLN: 1.0000 | Freq: Once | ORAL | Status: DC | PRN

## 2016-07-25 MED ORDER — PROPOFOL 10 MG/ML IV BOLUS
INTRAVENOUS | Status: DC | PRN
  Administered 2016-07-25: 20 mg via INTRAVENOUS

## 2016-07-25 MED ORDER — OXYCODONE HCL 5 MG/5ML PO SOLN
5.0000 mg | Freq: Once | ORAL | Status: DC | PRN
Start: 2016-07-25 — End: 2016-07-25

## 2016-07-25 MED ORDER — BISACODYL 10 MG RE SUPP: 10.0000 mg | Freq: Every day | RECTAL | Status: DC | PRN

## 2016-07-25 MED ORDER — PROPOFOL 10 MG/ML IV BOLUS
INTRAVENOUS | Status: AC
  Filled 2016-07-25: qty 40

## 2016-07-25 MED ORDER — MUPIROCIN 2 % EX OINT
TOPICAL_OINTMENT | Freq: Once | CUTANEOUS | Status: AC
  Administered 2016-07-25: 07:00:00 via NASAL
  Filled 2016-07-25: qty 22

## 2016-07-25 MED ORDER — CEFAZOLIN SODIUM-DEXTROSE 2-4 GM/100ML-% IV SOLN
INTRAVENOUS | Status: AC
  Filled 2016-07-25: qty 100

## 2016-07-25 MED ORDER — PHENYLEPHRINE 40 MCG/ML (10ML) SYRINGE FOR IV PUSH (FOR BLOOD PRESSURE SUPPORT)
PREFILLED_SYRINGE | INTRAVENOUS | Status: DC | PRN
  Administered 2016-07-25 (×2): 80 ug via INTRAVENOUS

## 2016-07-25 MED ORDER — MEPERIDINE HCL 50 MG/ML IJ SOLN
INTRAMUSCULAR | Status: DC
Start: 2016-07-25 — End: 2016-07-25
  Filled 2016-07-25: qty 1

## 2016-07-25 MED ORDER — HYDROMORPHONE HCL 1 MG/ML IJ SOLN
0.5000 mg | INTRAMUSCULAR | Status: DC | PRN
  Administered 2016-07-25: 0.5 mg via INTRAVENOUS
  Administered 2016-07-25 (×2): 1 mg via INTRAVENOUS
  Filled 2016-07-25 (×2): qty 1

## 2016-07-25 MED ORDER — METHOCARBAMOL 500 MG PO TABS
500.0000 mg | ORAL_TABLET | Freq: Four times a day (QID) | ORAL | Status: DC | PRN
  Administered 2016-07-25 – 2016-07-26 (×4): 500 mg via ORAL
  Filled 2016-07-25 (×4): qty 1

## 2016-07-25 MED ORDER — PHENYLEPHRINE 40 MCG/ML (10ML) SYRINGE FOR IV PUSH (FOR BLOOD PRESSURE SUPPORT)
PREFILLED_SYRINGE | INTRAVENOUS | Status: AC
  Filled 2016-07-25: qty 10

## 2016-07-25 MED ORDER — FERROUS SULFATE 325 (65 FE) MG PO TABS
325.0000 mg | ORAL_TABLET | Freq: Three times a day (TID) | ORAL | Status: DC
  Administered 2016-07-25 – 2016-07-26 (×3): 325 mg via ORAL
  Filled 2016-07-25 (×3): qty 1

## 2016-07-25 MED ORDER — PHENOL 1.4 % MT LIQD
1.0000 | OROMUCOSAL | Status: DC | PRN
Start: 2016-07-25 — End: 2016-07-26

## 2016-07-25 MED ORDER — FENTANYL CITRATE (PF) 100 MCG/2ML IJ SOLN
INTRAMUSCULAR | Status: DC | PRN
  Administered 2016-07-25 (×2): 50 ug via INTRAVENOUS

## 2016-07-25 MED ORDER — METOCLOPRAMIDE HCL 5 MG PO TABS
5.0000 mg | ORAL_TABLET | Freq: Three times a day (TID) | ORAL | Status: DC | PRN
Start: 2016-07-25 — End: 2016-07-26

## 2016-07-25 MED ORDER — CITALOPRAM HYDROBROMIDE 20 MG PO TABS
20.0000 mg | ORAL_TABLET | Freq: Every day | ORAL | Status: DC
  Administered 2016-07-25: 20 mg via ORAL
  Filled 2016-07-25: qty 1

## 2016-07-25 SURGICAL SUPPLY — 41 items
ADH SKN CLS APL DERMABOND .7 (GAUZE/BANDAGES/DRESSINGS) ×1
BAG SPEC THK2 15X12 ZIP CLS (MISCELLANEOUS) ×1
BAG ZIPLOCK 12X15 (MISCELLANEOUS) ×2 IMPLANT
CAPT HIP TOTAL 2 ×2 IMPLANT
CLOTH BEACON ORANGE TIMEOUT ST (SAFETY) ×3 IMPLANT
COVER PERINEAL POST (MISCELLANEOUS) ×3 IMPLANT
DERMABOND ADVANCED (GAUZE/BANDAGES/DRESSINGS) ×2
DERMABOND ADVANCED .7 DNX12 (GAUZE/BANDAGES/DRESSINGS) ×1 IMPLANT
DRAPE INCISE 23X17 IOBAN STRL (DRAPES) ×2
DRAPE INCISE 23X17 STRL (DRAPES) IMPLANT
DRAPE INCISE IOBAN 23X17 STRL (DRAPES) ×1 IMPLANT
DRAPE U-SHAPE 47X51 STRL (DRAPES) ×6 IMPLANT
DRESSING AQUACEL AG SP 3.5X10 (GAUZE/BANDAGES/DRESSINGS) ×1 IMPLANT
DRSG AQUACEL AG SP 3.5X10 (GAUZE/BANDAGES/DRESSINGS) ×3
DRSG TEGADERM 4X4.75 (GAUZE/BANDAGES/DRESSINGS) ×4 IMPLANT
ELECT REM PT RETURN 9FT ADLT (ELECTROSURGICAL) ×3
ELECTRODE REM PT RTRN 9FT ADLT (ELECTROSURGICAL) ×1 IMPLANT
GLOVE BIOGEL PI IND STRL 6.5 (GLOVE) IMPLANT
GLOVE BIOGEL PI IND STRL 7.0 (GLOVE) IMPLANT
GLOVE BIOGEL PI IND STRL 7.5 (GLOVE) ×1 IMPLANT
GLOVE BIOGEL PI IND STRL 8 (GLOVE) IMPLANT
GLOVE BIOGEL PI INDICATOR 6.5 (GLOVE) ×2
GLOVE BIOGEL PI INDICATOR 7.0 (GLOVE) ×2
GLOVE BIOGEL PI INDICATOR 7.5 (GLOVE) ×8
GLOVE BIOGEL PI INDICATOR 8 (GLOVE) ×4
GLOVE SURG SS PI 7.0 STRL IVOR (GLOVE) ×2 IMPLANT
GLOVE SURG SS PI 7.5 STRL IVOR (GLOVE) ×6 IMPLANT
GLOVE SURG SS PI 8.0 STRL IVOR (GLOVE) ×6 IMPLANT
GOWN STRL REUS W/TWL 2XL LVL3 (GOWN DISPOSABLE) ×2 IMPLANT
GOWN STRL REUS W/TWL LRG LVL3 (GOWN DISPOSABLE) ×5 IMPLANT
GOWN STRL REUS W/TWL XL LVL3 (GOWN DISPOSABLE) ×5 IMPLANT
HOLDER FOLEY CATH W/STRAP (MISCELLANEOUS) ×3 IMPLANT
PACK ANTERIOR HIP CUSTOM (KITS) ×3 IMPLANT
SAW OSC TIP CART 19.5X105X1.3 (SAW) ×3 IMPLANT
SUT MNCRL AB 4-0 PS2 18 (SUTURE) ×3 IMPLANT
SUT VIC AB 1 CT1 36 (SUTURE) ×9 IMPLANT
SUT VIC AB 2-0 CT1 27 (SUTURE) ×6
SUT VIC AB 2-0 CT1 TAPERPNT 27 (SUTURE) ×2 IMPLANT
SUT VIC AB 2-0 CT2 27 (SUTURE) ×2 IMPLANT
SUT VLOC 180 0 24IN GS25 (SUTURE) ×3 IMPLANT
YANKAUER SUCT BULB TIP 10FT TU (MISCELLANEOUS) ×2 IMPLANT

## 2016-07-25 NOTE — Interval H&P Note (Signed)
History and Physical Interval Note:  07/25/2016 7:14 AM  Molly Giles  has presented today for surgery, with the diagnosis of LEFT HIP OA  The various methods of treatment have been discussed with the patient and family. After consideration of risks, benefits and other options for treatment, the patient has consented to  Procedure(s): LEFT TOTAL HIP ARTHROPLASTY ANTERIOR APPROACH (Left) as a surgical intervention .  The patient's history has been reviewed, patient examined, no change in status, stable for surgery.  I have reviewed the patient's chart and labs.  Questions were answered to the patient's satisfaction.     Mauri Pole

## 2016-07-25 NOTE — Discharge Instructions (Signed)

## 2016-07-25 NOTE — Op Note (Signed)
NAME:  Molly Giles                ACCOUNT NO.: [id number]      MEDICAL RECORD NO.: [id number]      FACILITY:  [facility]      PHYSICIAN:  Paralee Cancel D  DATE OF BIRTH:  1956/01/22     DATE OF PROCEDURE:  07/25/2016                                 OPERATIVE REPORT         PREOPERATIVE DIAGNOSIS: Left  hip osteoarthritis.      POSTOPERATIVE DIAGNOSIS:  Left hip osteoarthritis.      PROCEDURE:  Left total hip replacement through an anterior approach   utilizing DePuy THR system, component size 70mm pinnacle cup, a size 36+4 neutral   Altrex liner, a size 6 Hi Tri Lock stem with a 36+5 delta ceramic   ball.      SURGEON:  Pietro Cassis. Alvan Dame, M.D.      ASSISTANT:  Nehemiah Massed, PA-C     ANESTHESIA:  Spinal.      SPECIMENS:  None.      COMPLICATIONS:  None.      BLOOD LOSS:  350 cc     DRAINS:  None.      INDICATION OF THE PROCEDURE:  Molly Giles is a 60 y.o. female who had   presented to office for evaluation of left hip pain.  Radiographs revealed   progressive degenerative changes with bone-on-bone   articulation to the  hip joint.  The patient had painful limited range of   motion significantly affecting their overall quality of life.  The patient was failing to    respond to conservative measures, and at this point was ready   to proceed with more definitive measures.  The patient has noted progressive   degenerative changes in his hip, progressive problems and dysfunction   with regarding the hip prior to surgery.  Consent was obtained for   benefit of pain relief.  Specific risk of infection, DVT, component   failure, dislocation, need for revision surgery, as well discussion of   the anterior versus posterior approach were reviewed.  Consent was   obtained for benefit of anterior pain relief through an anterior   approach.      PROCEDURE IN DETAIL:  The patient was brought to operative theater.   Once adequate anesthesia, preoperative  antibiotics, 2 gm of Ancef, 1 gm of Tranexamic Acid, and 10 mg of Decadron administered.   The patient was positioned supine on the OSI Hanna table.  Once adequate   padding of boney process was carried out, we had predraped out the hip, and  used fluoroscopy to confirm orientation of the pelvis and position.      The left hip was then prepped and draped from proximal iliac crest to   mid thigh with shower curtain technique.      Time-out was performed identifying the patient, planned procedure, and   extremity.     An incision was then made 2 cm distal and lateral to the   anterior superior iliac spine extending over the orientation of the   tensor fascia lata muscle and sharp dissection was carried down to the   fascia of the muscle and protractor placed in the soft tissues.      The fascia  was then incised.  The muscle belly was identified and swept   laterally and retractor placed along the superior neck.  Following   cauterization of the circumflex vessels and removing some pericapsular   fat, a second cobra retractor was placed on the inferior neck.  A third   retractor was placed on the anterior acetabulum after elevating the   anterior rectus.  A L-capsulotomy was along the line of the   superior neck to the trochanteric fossa, then extended proximally and   distally.  Tag sutures were placed and the retractors were then placed   intracapsular.  We then identified the trochanteric fossa and   orientation of my neck cut, confirmed this radiographically   and then made a neck osteotomy with the femur on traction.  The femoral   head was removed without difficulty or complication.  Traction was let   off and retractors were placed posterior and anterior around the   acetabulum.      The labrum and foveal tissue were debrided.  I began reaming with a 70mm   reamer and reamed up to 82mm reamer with good bony bed preparation and a 9mm   cup was chosen.  The final 30mm Pinnacle cup  was then impacted under fluoroscopy  to confirm the depth of penetration and orientation with respect to   abduction.  A screw was placed followed by the hole eliminator.  The final   36+4 neutral Altrex liner was impacted with good visualized rim fit.  The cup was positioned anatomically within the acetabular portion of the pelvis.      At this point, the femur was rolled at 80 degrees.  Further capsule was   released off the inferior aspect of the femoral neck.  I then   released the superior capsule proximally.  The hook was placed laterally   along the femur and elevated manually and held in position with the bed   hook.  The leg was then extended and adducted with the leg rolled to 100   degrees of external rotation.  Once the proximal femur was fully   exposed, I used a box osteotome to set orientation.  I then began   broaching with the starting chili pepper broach and passed this by hand and then broached up to 6.  With the 6 broach in place I chose a high offset neck and did several trial reductions.  The offset was appropriate, leg lengths   appeared to be equal best matched with the +5 head ball confirmed radiographically.   Given these findings, I went ahead and dislocated the hip, repositioned all   retractors and positioned the right hip in the extended and abducted position.  The final 6 Hi Tri Lock stem was   chosen and it was impacted down to the level of neck cut.  Based on this   and the trial reduction, a 36+5 delta ceramic ball was chosen and   impacted onto a clean and dry trunnion, and the hip was reduced.  The   hip had been irrigated throughout the case again at this point.  I did   reapproximate the superior capsular leaflet to the anterior leaflet   using #1 Vicryl.  The fascia of the   tensor fascia lata muscle was then reapproximated using #1 Vicryl and #0 V-lock sutures.  The   remaining wound was closed with 2-0 Vicryl and running 4-0 Monocryl.   The hip was  cleaned, dried,  and dressed sterilely using Dermabond and   Aquacel dressing.  She was then brought   to recovery room in stable condition tolerating the procedure well.    Nehemiah Massed, PA-C was present for the entirety of the case involved from   preoperative positioning, perioperative retractor management, general   facilitation of the case, as well as primary wound closure as assistant.            Pietro Cassis Alvan Dame, M.D.        07/25/2016 10:05 AM

## 2016-07-25 NOTE — Transfer of Care (Signed)
Immediate Anesthesia Transfer of Care Note  Patient: Molly Giles  Procedure(s) Performed: Procedure(s): LEFT TOTAL HIP ARTHROPLASTY ANTERIOR APPROACH (Left)  Patient Location: PACU  Anesthesia Type:Spinal  Level of Consciousness:  sedated, patient cooperative and responds to stimulation  Airway & Oxygen Therapy:Patient Spontanous Breathing and Patient connected to face mask oxgen  Post-op Assessment:  Report given to PACU RN and Post -op Vital signs reviewed and stable  Post vital signs:  Reviewed and stable  Last Vitals:  Vitals:   07/25/16 0610  BP: 117/85  Pulse: 85  Resp: 16  Temp: 123XX123 C    Complications: No apparent anesthesia complications

## 2016-07-25 NOTE — Interval H&P Note (Signed)
History and Physical Interval Note:  07/25/2016 7:13 AM  Molly Giles  has presented today for surgery, with the diagnosis of LEFT HIP OA  The various methods of treatment have been discussed with the patient and family. After consideration of risks, benefits and other options for treatment, the patient has consented to  Procedure(s): LEFT TOTAL HIP ARTHROPLASTY ANTERIOR APPROACH (Left) as a surgical intervention .  The patient's history has been reviewed, patient examined, no change in status, stable for surgery.  I have reviewed the patient's chart and labs.  Questions were answered to the patient's satisfaction.     Mauri Pole

## 2016-07-25 NOTE — Anesthesia Postprocedure Evaluation (Signed)
Anesthesia Post Note  Patient: Molly Giles  Procedure(s) Performed: Procedure(s) (LRB): LEFT TOTAL HIP ARTHROPLASTY ANTERIOR APPROACH (Left)  Patient location during evaluation: PACU Anesthesia Type: Spinal Level of consciousness: oriented and awake and alert Pain management: pain level controlled Vital Signs Assessment: post-procedure vital signs reviewed and stable Respiratory status: spontaneous breathing, respiratory function stable and patient connected to nasal cannula oxygen Cardiovascular status: blood pressure returned to baseline and stable Postop Assessment: no headache and no backache Anesthetic complications: no    Last Vitals:  Vitals:   07/25/16 0610 07/25/16 1040  BP: 117/85   Pulse: 85   Resp: 16   Temp: 36.7 C (P) 36.4 C    Last Pain:  Vitals:   07/25/16 0655  TempSrc:   PainSc: [name]

## 2016-07-25 NOTE — Evaluation (Signed)
Physical Therapy Evaluation Patient Details Name: Molly Giles MRN: [id number] DOB: 14-Jul-1956 Today's Date: 07/25/2016   History of Present Illness  Pt s/p L THR and with hx of DDD and fibromyalgia  Clinical Impression  Pt s/p L THR presents with decreased L LE strength/ROM and post op pain limiting functional mobility.  Pt should progress to dc home with family assist and HHPT follow up.    Follow Up Recommendations Home health PT    Equipment Recommendations  Rolling walker with 5" wheels    Recommendations for Other Services OT consult     Precautions / Restrictions Precautions Precautions: Fall Restrictions Weight Bearing Restrictions: No Other Position/Activity Restrictions: WBAT      Mobility  Bed Mobility Overal bed mobility: Needs Assistance Bed Mobility: Supine to Sit;Sit to Supine     Supine to sit: Min assist;+2 for safety/equipment Sit to supine: Min assist;+2 for safety/equipment   General bed mobility comments: cues for sequence and use of R LE to self assist  Transfers Overall transfer level: Needs assistance Equipment used: Rolling walker (2 wheeled) Transfers: Sit to/from Stand Sit to Stand: Min assist;+2 safety/equipment;From elevated surface         General transfer comment: cues for LE management and use of UEs to self assist  Ambulation/Gait Ambulation/Gait assistance: Min assist;+2 safety/equipment Ambulation Distance (Feet): 3 Feet Assistive device: Rolling walker (2 wheeled) Gait Pattern/deviations: Step-to pattern;Decreased step length - right;Decreased step length - left;Shuffle;Trunk flexed Gait velocity: decr Gait velocity interpretation: Below normal speed for age/gender General Gait Details: side stepped up side of bed only 2* dizziness in standing  Stairs            Wheelchair Mobility    Modified Rankin (Stroke Patients Only)       Balance                                              Pertinent Vitals/Pain Pain Assessment: 0-10 Pain Score: 6  Pain Location: L hip Pain Descriptors / Indicators: Aching;Sore Pain Intervention(s): Premedicated before session;Monitored during session;Limited activity within patient's tolerance;Ice applied    Home Living Family/patient expects to be discharged to:: Private residence Living Arrangements: Spouse/significant other Available Help at Discharge: Family Type of Home: House Home Access: Stairs to enter Entrance Stairs-Rails: None Technical brewer of Steps: 3 Home Layout: One level Home Equipment: None      Prior Function Level of Independence: Independent               Hand Dominance        Extremity/Trunk Assessment   Upper Extremity Assessment: Overall WFL for tasks assessed           Lower Extremity Assessment: LLE deficits/detail      Cervical / Trunk Assessment: Normal  Communication   Communication: No difficulties  Cognition Arousal/Alertness: Awake/alert Behavior During Therapy: WFL for tasks assessed/performed Overall Cognitive Status: Within Functional Limits for tasks assessed                      General Comments      Exercises Total Joint Exercises Ankle Circles/Pumps: AROM;Both;15 reps;Supine   Assessment/Plan    PT Assessment Patient needs continued PT services  PT Problem List Decreased strength;Decreased range of motion;Decreased activity tolerance;Decreased mobility;Decreased knowledge of use of DME;Pain  PT Treatment Interventions DME instruction;Gait training;Stair training;Functional mobility training;Therapeutic activities;Therapeutic exercise;Patient/family education    PT Goals (Current goals can be found in the Care Plan section)  Acute Rehab PT Goals Patient Stated Goal: Regain IND PT Goal Formulation: With patient Time For Goal Achievement: 07/28/16 Potential to Achieve Goals: Good    Frequency 7X/week   Barriers to discharge         Co-evaluation               End of Session Equipment Utilized During Treatment: Gait belt Activity Tolerance: Other (comment) (dizziness with OOB) Patient left: in bed;with call bell/phone within reach Nurse Communication: Mobility status         Time: [id number] PT Time Calculation (min) (ACUTE ONLY): 22 min   Charges:   PT Evaluation $PT Eval Low Complexity: 1 Procedure     PT G Codes:        Chistian Kasler 07-27-16, 5:35 PM

## 2016-07-25 NOTE — Anesthesia Preprocedure Evaluation (Signed)
Anesthesia Evaluation  Patient identified by MRN, date of birth, ID band Patient awake    Reviewed: Allergy & Precautions, H&P , NPO status , Patient's Chart, lab work & pertinent test results  History of Anesthesia Complications (+) PONV and history of anesthetic complications  Airway Mallampati: II   Neck ROM: full    Dental   Pulmonary shortness of breath,    breath sounds clear to auscultation       Cardiovascular hypertension,  Rhythm:regular Rate:Normal     Neuro/Psych  Headaches, Depression    GI/Hepatic GERD  ,  Endo/Other  Hypothyroidism   Renal/GU      Musculoskeletal  (+) Arthritis , Fibromyalgia -  Abdominal   Peds  Hematology   Anesthesia Other Findings   Reproductive/Obstetrics                             Anesthesia Physical Anesthesia Plan  ASA: II  Anesthesia Plan: MAC and Spinal   Post-op Pain Management:    Induction: Intravenous  Airway Management Planned: Simple Face Mask  Additional Equipment:   Intra-op Plan:   Post-operative Plan:   Informed Consent: I have reviewed the patients History and Physical, chart, labs and discussed the procedure including the risks, benefits and alternatives for the proposed anesthesia with the patient or authorized representative who has indicated his/her understanding and acceptance.     Plan Discussed with: CRNA, Anesthesiologist and Surgeon  Anesthesia Plan Comments:         Anesthesia Quick Evaluation

## 2016-07-25 NOTE — Anesthesia Procedure Notes (Signed)
Spinal  Start time: 07/25/2016 8:37 AM End time: 07/25/2016 8:41 AM Staffing Anesthesiologist: Marcie Bal, ADAM Resident/CRNA: Lajuana Carry E Performed: resident/CRNA  Preanesthetic Checklist Completed: patient identified, site marked, surgical consent, pre-op evaluation, timeout performed, IV checked, risks and benefits discussed and monitors and equipment checked Spinal Block Patient position: sitting Prep: chloroxylenol Patient monitoring: heart rate, continuous pulse ox and blood pressure Approach: midline Location: L3-4 Injection technique: single-shot Needle Needle type: Pencan  Needle gauge: 24 G Needle length: 9 cm Assessment Sensory level: T6 Additional Notes Time out, skin prepped and dried with chloroxylenol d/t betadine and hibaclens allergies. First attempt clear CSF, neg heme pre/post injection. Returned to supine

## 2016-07-26 LAB — CBC
HCT: 33.6 % — ABNORMAL LOW (ref 36.0–46.0)
HEMOGLOBIN: 10.9 g/dL — AB (ref 12.0–15.0)
MCH: 29.2 pg (ref 26.0–34.0)
MCHC: 32.4 g/dL (ref 30.0–36.0)
MCV: 90.1 fL (ref 78.0–100.0)
Platelets: 236 10*3/uL (ref 150–400)
RBC: 3.73 MIL/uL — AB (ref 3.87–5.11)
RDW: 14.6 % (ref 11.5–15.5)
WBC: 15.6 10*3/uL — ABNORMAL HIGH (ref 4.0–10.5)

## 2016-07-26 LAB — BASIC METABOLIC PANEL
Anion gap: 5 (ref 5–15)
BUN: 10 mg/dL (ref 6–20)
CHLORIDE: 110 mmol/L (ref 101–111)
CO2: 25 mmol/L (ref 22–32)
CREATININE: 0.62 mg/dL (ref 0.44–1.00)
Calcium: 8.8 mg/dL — ABNORMAL LOW (ref 8.9–10.3)
GFR calc non Af Amer: >60 mL/min (ref >60)
Glucose, Bld: 132 mg/dL — ABNORMAL HIGH (ref 65–99)
POTASSIUM: 4.6 mmol/L (ref 3.5–5.1)
SODIUM: 140 mmol/L (ref 135–145)

## 2016-07-26 MED ORDER — FERROUS SULFATE 325 (65 FE) MG PO TABS: 325.0000 mg | ORAL_TABLET | Freq: Three times a day (TID) | ORAL | 3 refills | Status: DC

## 2016-07-26 MED ORDER — POLYETHYLENE GLYCOL 3350 17 G PO PACK: 17.0000 g | PACK | Freq: Two times a day (BID) | ORAL | 0 refills | Status: DC

## 2016-07-26 MED ORDER — HYDROCODONE-ACETAMINOPHEN 7.5-325 MG PO TABS: 1.0000 | ORAL_TABLET | ORAL | 0 refills | Status: DC | PRN

## 2016-07-26 MED ORDER — DOCUSATE SODIUM 100 MG PO CAPS: 100.0000 mg | ORAL_CAPSULE | Freq: Two times a day (BID) | ORAL | 0 refills | Status: DC

## 2016-07-26 MED ORDER — ASPIRIN 81 MG PO CHEW: 81.0000 mg | CHEWABLE_TABLET | Freq: Two times a day (BID) | ORAL | 0 refills | Status: AC

## 2016-07-26 MED ORDER — METHOCARBAMOL 500 MG PO TABS: 500.0000 mg | ORAL_TABLET | Freq: Four times a day (QID) | ORAL | 0 refills | Status: DC | PRN

## 2016-07-26 NOTE — Progress Notes (Signed)
RN reviewed discharge instructions with patient and family. All questions answered.   Paperwork and prescriptions given to patient.   NT rolled patient down with all belongings to family car.  

## 2016-07-26 NOTE — Progress Notes (Signed)
     Subjective: 1 Day Post-Op Procedure(s) (LRB): LEFT TOTAL HIP ARTHROPLASTY ANTERIOR APPROACH (Left)   Patient reports pain as moderate, pain controlled. No events throughout the night. Discussed the surgery and the post-op expectations.  Ready to be discharged home if she does well with PT.   Objective:   VITALS:   Vitals:   07/26/16 0125 07/26/16 0533  BP: 107/62 111/65  Pulse: 86 84  Resp: 15 14  Temp: 98 F (36.7 C) 98.8 F (37.1 C)    Dorsiflexion/Plantar flexion intact Incision: dressing C/D/I No cellulitis present Compartment soft  LABS  Recent Labs  07/26/16 0419  HGB 10.9*  HCT 33.6*  WBC 15.6*  PLT 236     Recent Labs  07/26/16 0419  NA 140  K 4.6  BUN 10  CREATININE 0.62  GLUCOSE 132*     Assessment/Plan: 1 Day Post-Op Procedure(s) (LRB): LEFT TOTAL HIP ARTHROPLASTY ANTERIOR APPROACH (Left) Foley cath d/c'ed Advance diet Up with therapy D/C IV fluids Discharge home Follow up in 2 weeks at [facility]. Follow up with OLIN,Jomar Denz D in 2 weeks.  Contact information:  [facility] [address], [place] [id number]    Obese (BMI 30-39.9) Estimated body mass index is 31.64 kg/m as calculated from the following:   Height as of this encounter: 5\' 6"  (1.676 m).   Weight as of this encounter: 88.9 kg (196 lb). Patient also counseled that weight may inhibit the healing process Patient counseled that losing weight will help with future health issues        [place] Pugh. Ariadna Setter   PAC  07/26/2016, 8:43 AM

## 2016-07-26 NOTE — Progress Notes (Signed)
Physical Therapy Treatment Patient Details Name: Molly Giles MRN: [id number] DOB: [birth year] Today's Date: 07/26/2016    History of Present Illness Pt s/p L THR and with hx of DDD and fibromyalgia    PT Comments    POD # 1 am session Assisted with amb a greater distance in hallway.  Practiced stairs but will need to repeat for spouse during her next session, then assisted to bathroom before assisting back to bed.  Instructed on how to use a belt to assist L LU up onto bed and reposition in bed.  Instructed on sidelying positioning with pillows.   Follow Up Recommendations  Home health PT     Equipment Recommendations  Rolling walker with 5" wheels;3in1 (PT) (delivered and in room)    Recommendations for Other Services OT consult     Precautions / Restrictions Precautions Precautions: Fall Restrictions Weight Bearing Restrictions: No Other Position/Activity Restrictions: WBAT    Mobility  Bed Mobility Overal bed mobility: Needs Assistance       Supine to sit: Min guard Sit to supine: Min assist   General bed mobility comments: used a belt to self assist L LE onto bed and aide in repositioning R sidelying with pillows  Transfers Overall transfer level: Needs assistance Equipment used: Rolling walker (2 wheeled) Transfers: Sit to/from [facility] Sit to Stand: Supervision;Min guard Stand pivot transfers: Supervision;Min guard       General transfer comment: cues for UE/LE placement plus increased time  Ambulation/Gait Ambulation/Gait assistance: Supervision;Min guard Ambulation Distance (Feet): 42 Feet Assistive device: Rolling walker (2 wheeled) Gait Pattern/deviations: Step-to pattern;Step-through pattern Gait velocity: decr   General Gait Details: 25% VC's onproper walker to self distance and safety with turns   Stairs Stairs: Yes Stairs assistance: Min assist Stair Management: No rails;Step to pattern;Backwards;With walker Number of  Stairs: 2 General stair comments: 50% VC's on proper sequencing and walker placement.  Will need to repeat with spouse during second session.  Wheelchair Mobility    Modified Rankin (Stroke Patients Only)       Balance                                    Cognition Arousal/Alertness: Awake/alert Behavior During Therapy: WFL for tasks assessed/performed Overall Cognitive Status: Within Functional Limits for tasks assessed                      Exercises      General Comments        Pertinent Vitals/Pain Pain Assessment: 0-10 Pain Score: 6  Pain Location: L hip Pain Descriptors / Indicators: Tender;Tingling;Discomfort;Grimacing Pain Intervention(s): Monitored during session;Repositioned    Home Living Family/patient expects to be discharged to:: Private residence Living Arrangements: Spouse/significant other Available Help at Discharge: Family         Home Equipment: None      Prior Function Level of Independence: Independent          PT Goals (current goals can now be found in the care plan section) Acute Rehab PT Goals Patient Stated Goal: Regain IND Progress towards PT goals: Progressing toward goals    Frequency    7X/week      PT Plan Current plan remains appropriate    Co-evaluation             End of Session   Activity Tolerance: Patient limited by fatigue Patient left: in  bed;with call bell/phone within reach     Time: 1130-1155 PT Time Calculation (min) (ACUTE ONLY): 25 min  Charges:  $Gait Training: 8-22 mins $Therapeutic Activity: 8-22 mins                    G Codes:      Rica Koyanagi  PTA [facility]  Acute  Rehab Pager      [phone]

## 2016-07-26 NOTE — Progress Notes (Signed)
Physical Therapy Treatment Patient Details Name: Molly Giles MRN: [id number] DOB: 1955-11-05 Today's Date: 07/26/2016    History of Present Illness Pt s/p L THR and with hx of DDD and fibromyalgia    PT Comments    POD # 1 pm session Spouse present for instruction.  Practiced stairs and performed HEP.  Instructed on proper tech and freq as well as use of ICE.   Handouts given on stairs and HEP.  Pt ready for D/C to home.   Follow Up Recommendations  Home health PT     Equipment Recommendations  Rolling walker with 5" wheels;3in1 (PT) (delivered and in room)    Recommendations for Other Services OT consult     Precautions / Restrictions Precautions Precautions: Fall Restrictions Weight Bearing Restrictions: No Other Position/Activity Restrictions: WBAT    Mobility  Bed Mobility Overal bed mobility: Needs Assistance         Sit to supine: Min assist   General bed mobility comments: OOB in recliner  Transfers Overall transfer level: Needs assistance Equipment used: Rolling walker (2 wheeled) Transfers: Sit to/from Stand Sit to Stand: Supervision Stand pivot transfers: Supervision       General transfer comment: cues for UE/LE placement plus increased time  Ambulation/Gait Ambulation/Gait assistance: Supervision Ambulation Distance (Feet): 55 Feet Assistive device: Rolling walker (2 wheeled) Gait Pattern/deviations: Step-to pattern;Step-through pattern;Decreased step length - right;Decreased step length - left;Trunk flexed Gait velocity: decr   General Gait Details: 25% VC's onproper walker to self distance and safety with turns plus increased time   Stairs Stairs: Yes Stairs assistance: Min assist Stair Management: No rails;Step to pattern;Backwards;With walker Number of Stairs: 4 General stair comments: 50% VC's on proper sequencing and walker placement.  performed with spouse and also handout given.l  Wheelchair Mobility    Modified Rankin  (Stroke Patients Only)       Balance                                    Cognition Arousal/Alertness: Awake/alert Behavior During Therapy: WFL for tasks assessed/performed Overall Cognitive Status: Within Functional Limits for tasks assessed                      Exercises   Total Hip Replacement TE's 10 reps ankle pumps 10 reps knee presses 10 reps heel slides 10 reps SAQ's 10 reps ABD Followed by ICE    General Comments        Pertinent Vitals/Pain Pain Assessment: 0-10 Pain Score: 5  Pain Location: L hip Pain Descriptors / Indicators: Tender;Sore;Discomfort Pain Intervention(s): Monitored during session;Repositioned;Ice applied    Home Living                      Prior Function            PT Goals (current goals can now be found in the care plan section) Progress towards PT goals: Progressing toward goals    Frequency    7X/week      PT Plan Current plan remains appropriate    Co-evaluation             End of Session Equipment Utilized During Treatment: Gait belt Activity Tolerance: Patient tolerated treatment well Patient left: in chair;with call bell/phone within reach;with family/visitor present     Time: 1330-1355 PT Time Calculation (min) (ACUTE ONLY): 25 min  Charges:  $Gait  Training: 8-22 mins $Therapeutic Activity: 8-22 mins                    G Codes:      Rica Koyanagi  PTA [facility]  Acute  Rehab Pager      [phone]

## 2016-07-26 NOTE — Care Management Note (Signed)
Case Management Note  Patient Details  Name: Molly Giles MRN: [id number] Date of Birth: [birth year]  Subjective/Objective:                  LEFT TOTAL HIP ARTHROPLASTY ANTERIOR APPROACH (Left)  Action/Plan: Discharge planning Expected Discharge Date:  07/26/16              Expected Discharge Plan:  [place]  In-House Referral:     Discharge planning Services  CM Consult  Post Acute Care Choice:  Home Health Choice offered to:  Patient  DME Arranged:  3-N-1, Walker rolling DME Agency:  [place]:  PT [place]:  [place]  Status of Service:  Completed, signed off  If discussed at [place] of Stay Meetings, dates discussed:    Additional Comments: CM met with pt in room to offer choice of home health agency.  Pt chooses [facility] to render HHPT.  Referral called to [facility] rep, Manuela Schwartz.  CM notified [place] DME rep, Reggie to please deliver the rolling walker and 3n1 to room prior to discharge.  No other CM needs were communicated. Dellie Catholic, RN 07/26/2016, 11:52 AM

## 2016-07-26 NOTE — Evaluation (Signed)
Occupational Therapy Evaluation Patient Details Name: Molly Giles MRN: [id number] DOB: 08/23/1956 Today's Date: 07/26/2016    History of Present Illness Pt s/p L THR and with hx of DDD and fibromyalgia   Clinical Impression   This 60 year old female was admitted for the above sx. All education was completed. No further OT is needed at thsi time    Follow Up Recommendations  No OT follow up    Equipment Recommendations  3 in 1 bedside comode    Recommendations for Other Services       Precautions / Restrictions Precautions Precautions: Fall Restrictions Other Position/Activity Restrictions: WBAT      Mobility Bed Mobility         Supine to sit: Min guard     General bed mobility comments: with leg lifter and cues for sequence  Transfers   Equipment used: Rolling walker (2 wheeled)   Sit to Stand: Min guard         General transfer comment: cues for UE/LE placement    Balance                                            ADL Overall ADL's : Needs assistance/impaired     Grooming: Wash/dry hands;Supervision/safety;Standing       Lower Body Bathing: Minimal assistance;Sit to/from stand;With adaptive equipment       Lower Body Dressing: Minimal assistance;Sit to/from stand;With adaptive equipment (underwear; mod A overall)   Toilet Transfer: Min guard;Ambulation;BSC;Requires wide/bariatric   Toileting- Water quality scientist and Hygiene: Min guard;Sit to/from stand   Tub/ Shower Transfer: Walk-in shower;Min guard;Ambulation;3 in 1     General ADL Comments: husband present and will assist pt at home. She tried leg lifter, but was still painful using this. Used reacher for underwear.  She plans to get this and a long sponge.       Vision     Perception     Praxis      Pertinent Vitals/Pain Pain Score: 8  Pain Location: L hip with weight bearing/moving Pain Descriptors / Indicators: Aching Pain Intervention(s):  Limited activity within patient's tolerance;Monitored during session;Premedicated before session;Repositioned;Ice applied     Hand Dominance     Extremity/Trunk Assessment Upper Extremity Assessment Upper Extremity Assessment: Overall WFL for tasks assessed           Communication Communication Communication: No difficulties   Cognition Arousal/Alertness: Awake/alert Behavior During Therapy: WFL for tasks assessed/performed Overall Cognitive Status: Within Functional Limits for tasks assessed                     General Comments       Exercises       Shoulder Instructions      Home Living Family/patient expects to be discharged to:: Private residence Living Arrangements: Spouse/significant other Available Help at Discharge: Family               Bathroom Shower/Tub: Walk-in Psychologist, prison and probation services: Standard     Home Equipment: None          Prior Functioning/Environment Level of Independence: Independent                 OT Problem List:     OT Treatment/Interventions:      OT Goals(Current goals can be found in the care plan section) Acute Rehab  OT Goals Patient Stated Goal: Regain IND OT Goal Formulation: All assessment and education complete, DC therapy  OT Frequency:     Barriers to D/C:            Co-evaluation              End of Session    Activity Tolerance: Patient tolerated treatment well Patient left: in chair;with call bell/phone within reach;with family/visitor present   Time: [id number] OT Time Calculation (min): 32 min Charges:  OT General Charges $OT Visit: 1 Procedure OT Evaluation $OT Eval Low Complexity: 1 Procedure OT Treatments $Self Care/Home Management : 8-22 mins G-Codes:    Dwane Andres 2016-08-14, 10:31 AM Lesle Chris, OTR/L [phone] 2016-08-14

## 2016-07-28 NOTE — Discharge Summary (Signed)
Physician Discharge Summary  Patient ID: Molly Giles MRN: [id number] DOB/AGE: 05-15-1956 60 y.o.  Admit date: 07/25/2016 Discharge date: 07/26/2016   Procedures:  Procedure(s) (LRB): LEFT TOTAL HIP ARTHROPLASTY ANTERIOR APPROACH (Left)  Attending Physician:  Dr. Paralee Cancel   Admission Diagnoses:   Left hip primary OA / pain  Discharge Diagnoses:  Principal Problem:   S/P left THA, AA  Past Medical History:  Diagnosis Date  . Allergic rhinoconjunctivitis 07/02/2015  . Arthritis   . Benign positional vertigo 04/2015   Responded well to Vestibular Rehab  . Bruxism (teeth grinding)   . Chronic migraine 02/25/2016   Per Dr Jaynee Eagles review in notes from [facility] from September 2015. Showed total headache days last month 18. Severe headache days 7 days. Moderate headache days 5 days. Mild headache days last month sick days. Days without headache last month 10 days. Symptoms associated with photophobia, phonophobia, osmophobia, neck pain, dizziness, jaw pain, nasal congestion, vision disturbances, tingling and numbness, weakness and worsening with activity. Each headache attack last 3 hours depending on treatment in severity. Left side, the right side, easier side, the frontal area in the back of the head. Characterized as throbbing, pressure, tightness, squeezing, stabbing and burning   . Chronic migraine w/o aura w/o status migrainosus, not intractable 07/14/2015  . DDD (degenerative disc disease), lumbar   . Degenerative cervical disc   . Depression   . Dysrhythmia    seen by dr Wynonia Lawman- not a problem since she has been on Bystolic  . Essential hypertension 05/15/2013  . Family history of adverse reaction to anesthesia    Brother- N/V  . Family history of premature CAD 05/15/2013  . Fibromyalgia   . GERD (gastroesophageal reflux disease) 12/16/2014  . H/O seasonal allergies   . Hearing loss of both ears 07/27/2015   mild to borderline moderate low frequency hearing  loss improving to within normal limits bilaterally on audiology testing at [facility] in November 2016.    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 History of irritable bowel syndrome 02/15/2016  . History of migraine headaches 05/15/2013   Per Dr Jaynee Eagles review in notes from [facility] from September 2015. Showed total headache days last month 18. Severe headache days 7 days. Moderate headache days 5 days. Mild headache days last month sick days. Days without headache last month 10 days. Symptoms associated with photophobia, phonophobia, osmophobia, neck pain, dizziness, jaw pain, nasal congestion, vision disturbances, tingling and numbness, weakness and worsening with activity. Each headache attack last 3 hours depending on treatment in severity. Left side, the right side, easier side, the frontal area in the back of the head. Characterized as throbbing, pressure, tightness, squeezing, stabbing and burning    . Hx of bad fall 02/2015   Severe Facial/head trauma without fracture  . Hyperlipidemia 1998  . Hypertension 2004  . Hypothyroidism   . Impairment of balance 02/2015   Consequent of postconcussive syndrome  . Interstitial cystitis   . 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 Migraine 1962   Chronic Migraines  . Mood disorder ([place])   . Musculoskeletal neck pain 07/14/2015  . Normal coronary arteries 05/14/2014  . Osteoarthritis of left hip 11/01/2015   MRI order by Dr Alvan Dame (ortho) 10/2015 showed significant arthritis of left hip joint with cystic changes in femoral head c/w osteoarthritis  . 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  Overweight   . Palpitations 05/15/2013   [facility] manages  . PONV (postoperative nausea and vomiting)   . Post concussion syndrome 06/06/2015  . Post concussive syndrome 07/14/2015   Ms Rapley's post-concussive syndrome  manifesting in vertigo and headache, mood changes, poor balance, dizziness, and decreased concentration per Dr Jaynee Eagles at [facility].   . Shortness of breath dyspnea    with exertion  . Thyroid nodule 08/11/2009   Findings: The thyroid gland is within normal limits in size.  The gland is diffusely inhomogeneous. A small solid nodule is noted in the lower pole  medially on the right of 7 x 6 x 8 mm. A small solid nodule is noted inferiorly on the left of 3 x 3 x 4 mm.  IMPRESSION:  The thyroid gland is within normal limits in size with only small solid nodules present, the largest of only 8 mm in diameter on the right.      HPI:    Molly Giles, 60 y.o. female, has a history of pain and functional disability in the left hip(s) due to arthritis and patient has failed non-surgical conservative treatments for greater than 12 weeks to include NSAID's and/or analgesics, corticosteriod injections and activity modification.  Onset of symptoms was gradual starting >10 years ago with gradually worsening course since that time.The patient noted no past surgery on the left hip(s).  Patient currently rates pain in the left hip at 9 out of 10 with activity. Patient has worsening of pain with activity and weight bearing, trendelenberg gait, pain that interfers with activities of daily living and pain with passive range of motion. Patient has evidence of periarticular osteophytes and joint space narrowing by imaging studies. This condition presents safety issues increasing the risk of falls. There is no current active infection.   Risks, benefits and expectations were discussed with the patient.  Risks including but not limited to the risk of anesthesia, blood clots, nerve damage, blood vessel damage, failure of the prosthesis, infection and up to and including death.  Patient understand the risks, benefits and expectations and wishes to proceed with surgery.   PCP: MCDIARMID,TODD D, MD   Discharged Condition:  good  Hospital Course:  Patient underwent the above stated procedure on 07/25/2016. Patient tolerated the procedure well and brought to the recovery room in good condition and subsequently to the floor.  POD #1 BP: 111/65 ; Pulse: 84 ; Temp: 98.8 F (37.1 C) ; Resp: 14 Patient reports pain as moderate, pain controlled. No events throughout the night. Discussed the surgery and the post-op expectations.  Ready to be discharged home.  LABS  Basename    HGB     10.9  HCT     33.6    Discharge Exam: General appearance: alert, cooperative and no distress Extremities: Homans sign is negative, no sign of DVT, no edema, redness or tenderness in the calves or thighs and no ulcers, gangrene or trophic changes  Disposition: Home with follow up in 2 weeks   Follow-up Information    Mauri Pole, MD. Schedule an appointment as soon as possible for a visit in 2 week(s).   Specialty:  Orthopedic Surgery Contact information: [address] [place]  [address] [phone]        [facility] - [facility] Follow up.   Why:  rolling walker and 3n1 Contact information: [address] [address] [place]  Follow up.   Why:  home health physical therapy Contact information: [address] [place] [address] [phone]           Discharge Instructions    Call MD / Call 911    Complete by:  As directed    If you experience chest pain or shortness of breath, CALL 911 and be transported to the hospital emergency room.  If you develope a fever above 101 F, pus (white drainage) or increased drainage or redness at the wound, or calf pain, call your surgeon's office.   Change dressing    Complete by:  As directed    Maintain surgical dressing until follow up in the clinic. If the edges start to pull up, may reinforce with tape. If the dressing is no longer working, may remove and cover with gauze  and tape, but must keep the area dry and clean.  Call with any questions or concerns.   Constipation Prevention    Complete by:  As directed    Drink plenty of fluids.  Prune juice may be helpful.  You may use a stool softener, such as Colace (over the counter) 100 mg twice a day.  Use MiraLax (over the counter) for constipation as needed.   Diet - low sodium heart healthy    Complete by:  As directed    Discharge instructions    Complete by:  As directed    Maintain surgical dressing until follow up in the clinic. If the edges start to pull up, may reinforce with tape. If the dressing is no longer working, may remove and cover with gauze and tape, but must keep the area dry and clean.  Follow up in 2 weeks at [facility]. Call with any questions or concerns.   Increase activity slowly as tolerated    Complete by:  As directed    Weight bearing as tolerated with assist device (walker, cane, etc) as directed, use it as long as suggested by your surgeon or therapist, typically at least 4-6 weeks.   TED hose    Complete by:  As directed    Use stockings (TED hose) for 2 weeks on both leg(s).  You may remove them at night for sleeping.        Medication List    STOP taking these medications   ibuprofen 200 MG tablet Commonly known as:  ADVIL,MOTRIN   traMADol 50 MG tablet Commonly known as:  ULTRAM     TAKE these medications   amLODipine 5 MG tablet Commonly known as:  NORVASC Take 5 mg by mouth daily. bedtime   aspirin 81 MG chewable tablet Chew 1 tablet (81 mg total) by mouth 2 (two) times daily.   citalopram 20 MG tablet Commonly known as:  CELEXA Take 20 mg by mouth daily. bedtime   colestipol 5 g granules Commonly known as:  COLESTID Take 5 g by mouth daily as needed.   diazepam 5 MG tablet Commonly known as:  VALIUM Take 5 mg by mouth as needed for anxiety or sedation.   docusate sodium 100 MG capsule Commonly known as:  COLACE Take 1 capsule (100 mg  total) by mouth 2 (two) times daily.   DYMISTA 137-50 MCG/ACT Susp Generic drug:  Azelastine-Fluticasone Place 1 spray into the nose daily as needed.   ferrous sulfate 325 (65 FE) MG tablet Take 1 tablet (325 mg total) by mouth 3 (three) times daily after meals.   HYDROcodone-acetaminophen 7.5-325 MG tablet  Commonly known as:  NORCO Take 1-2 tablets by mouth every 4 (four) hours as needed for moderate pain.   levothyroxine 125 MCG tablet Commonly known as:  SYNTHROID, LEVOTHROID Take  0.5 tablet by mouth daily What changed:  additional instructions   loratadine 10 MG tablet Commonly known as:  CLARITIN Take 10 mg by mouth daily.   methocarbamol 500 MG tablet Commonly known as:  ROBAXIN Take 1 tablet (500 mg total) by mouth every 6 (six) hours as needed for muscle spasms.   montelukast 10 MG tablet Commonly known as:  SINGULAIR Take 1 tablet (10 mg total) by mouth at bedtime.   polyethylene glycol packet Commonly known as:  MIRALAX / GLYCOLAX Take 17 g by mouth 2 (two) times daily.   prednisoLONE acetate 1 % ophthalmic suspension Commonly known as:  PRED FORTE Place 1 drop into both eyes daily.   RELPAX 40 MG tablet Generic drug:  eletriptan Take 1 tablet (40 mg total) by mouth as needed for migraine. may repeat in 2 hours if necessary   rosuvastatin 10 MG tablet Commonly known as:  CRESTOR TAKE 1 TABLET BY MOUTH EVERY DAY   zolpidem 10 MG tablet Commonly known as:  AMBIEN Take 1 tablet (10 mg total) by mouth at bedtime as needed.        Signed: [place]. Candice Tobey   PA-C  07/28/2016, 1:15 PM

## 2016-08-14 ENCOUNTER — Other Ambulatory Visit: Payer: Self-pay | Admitting: Rheumatology

## 2016-08-15 NOTE — Telephone Encounter (Signed)
Last Visit: 12/28/15 Next Visit: 09/07/16 UDS: 10/20/15 Narc Agreement 09/17/13  Okay for a 30 day of Tramadol?

## 2016-08-16 ENCOUNTER — Other Ambulatory Visit: Payer: Self-pay | Admitting: *Deleted

## 2016-08-18 ENCOUNTER — Other Ambulatory Visit: Payer: Self-pay | Admitting: Family Medicine

## 2016-09-06 NOTE — Progress Notes (Deleted)
Office Visit Note  Patient: Molly Giles             Date of Birth: 30-May-1956           MRN: [id number]             PCP: Acquanetta Sit, MD Referring: McDiarmid, Blane Ohara, MD Visit Date: 09/07/2016 Occupation: @GUAROCC @    Subjective:  No chief complaint on file.   History of Present Illness: Molly Giles is a 60 y.o. female ***   Activities of Daily Living:  Patient reports morning stiffness for *** {minute/hour:19697}.   Patient {ACTIONS;DENIES/REPORTS:21021675::"Denies"} nocturnal pain.  Difficulty dressing/grooming: {ACTIONS;DENIES/REPORTS:21021675::"Denies"} Difficulty climbing stairs: {ACTIONS;DENIES/REPORTS:21021675::"Denies"} Difficulty getting out of chair: {ACTIONS;DENIES/REPORTS:21021675::"Denies"} Difficulty using hands for taps, buttons, cutlery, and/or writing: {ACTIONS;DENIES/REPORTS:21021675::"Denies"}   No Rheumatology ROS completed.   PMFS History:  Patient Active Problem List   Diagnosis Date Noted  . S/P left THA, AA 07/25/2016  . Irritable bowel syndrome with diarrhea 04/03/2016  . Chronic migraine 02/25/2016  . Left ventricular hypertrophy, mild 02/25/2016  . History of irritable bowel syndrome 02/15/2016  . 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 Osteoarthritis of left hip 11/01/2015  . Hearing loss of both ears 07/27/2015  . Post concussive syndrome 07/14/2015  . Musculoskeletal neck pain 07/14/2015  . Depression (NOS) 07/02/2015  . Allergic rhinoconjunctivitis 07/02/2015  . Fibromyalgia 07/01/2015  . History of Clostridium difficile colitis, persistent 07/01/2015  . Impairment of balance 02/10/2015  . Essential hypertension 05/15/2013  . Hypothyroidism 05/15/2013  . Hyperlipidemia 05/15/2013  . Interstitial cystitis 05/15/2013  . History of migraine headaches 05/15/2013  . 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 Allergic rhinoconjunctivitis  07/02/2015  . Arthritis   . Benign positional vertigo 04/2015   Responded well to V[facility]ibular Rehab  . Bruxism (teeth grinding)   . Chronic migraine 02/25/2016   Per Dr Jaynee Eagles review in notes from [facility] from September 2015. Showed total headache days last month 18. Severe headache days 7 days. Moderate headache days 5 days. Mild headache days last month sick days. Days without headache last month 10 days. Symptoms associated with photophobia, phonophobia, osmophobia, neck pain, dizziness, jaw pain, nasal congestion, vision disturbances, tingling and numbness, weakness and worsening with activity. Each headache attack last 3 hours depending on treatment in severity. Left side, the right side, easier side, the frontal area in the back of the head. Characterized as throbbing, pressure, tightness, squeezing, stabbing and burning   . Chronic migraine w/o aura w/o status migrainosus, not intractable 07/14/2015  . DDD (degenerative disc disease), lumbar   . Degenerative cervical disc   . Depression   . Dysrhythmia    seen by dr Wynonia Lawman- not a problem since she has been on Bystolic  . Essential hypertension 05/15/2013  . Family history of adverse reaction to anesthesia    Brother- N/V  . Family history of premature CAD 05/15/2013  . Fibromyalgia   . GERD (gastroesophageal reflux disease) 12/16/2014  . H/O seasonal allergies   . Hearing loss of both ears 07/27/2015   mild to borderline moderate low frequency hearing loss improving to within normal limits bilaterally on audiology testing at [facility] in November 2016.    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 History of irritable bowel syndrome 02/15/2016  . History of migraine headaches 05/15/2013   Per Dr Jaynee Eagles  review in notes from [facility] from September 2015. Showed total headache days last month 18. Severe headache days 7 days. Moderate headache days  5 days. Mild headache days last month sick days. Days without headache last month 10 days. Symptoms associated with photophobia, phonophobia, osmophobia, neck pain, dizziness, jaw pain, nasal congestion, vision disturbances, tingling and numbness, weakness and worsening with activity. Each headache attack last 3 hours depending on treatment in severity. Left side, the right side, easier side, the frontal area in the back of the head. Characterized as throbbing, pressure, tightness, squeezing, stabbing and burning    . Hx of bad fall 02/2015   Severe Facial/head trauma without fracture  . Hyperlipidemia 1998  . Hypertension 2004  . Hypothyroidism   . Impairment of balance 02/2015   Consequent of postconcussive syndrome  . Interstitial cystitis   . 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 Migraine 1962   Chronic Migraines  . Mood disorder ([place])   . Musculoskeletal neck pain 07/14/2015  . Normal coronary arteries 05/14/2014  . Osteoarthritis of left hip 11/01/2015   MRI order by Dr Alvan Dame (ortho) 10/2015 showed significant arthritis of left hip joint with cystic changes in femoral head c/w osteoarthritis  . 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 Overweight   . Palpitations 05/15/2013   [facility] manages  . PONV (postoperative nausea and vomiting)   . Post concussion syndrome 06/06/2015  . Post concussive syndrome 07/14/2015   Ms Rotan's post-concussive syndrome manifesting in vertigo and headache, mood changes, poor balance, dizziness, and decreased concentration per Dr Jaynee Eagles at [facility].   . Shortness of breath dyspnea    with exertion  . Thyroid nodule 08/11/2009   Findings: The thyroid gland is within normal limits in size.  The gland is diffusely inhomogeneous. A small solid nodule is noted in the lower pole  medially on the right of 7 x 6 x 8 mm. A small solid nodule is noted inferiorly on the left of 3 x 3 x 4 mm.  IMPRESSION:   The thyroid gland is within normal limits in size with only small solid nodules present, the largest of only 8 mm in diameter on the right.      Family History  Problem Relation Age of Onset  . Alzheimer's disease Mother   . Hyperlipidemia Mother   . Hypertension Mother   . Osteoporosis Mother   . Parkinson's disease Mother   . Migraines Sister   . Allergies Sister   . Hyperlipidemia Brother   . Cardiomyopathy Brother   . Diabetes type II Brother   . Kidney disease Brother   . Hypertension Brother   . Hyperlipidemia Brother   . Kidney disease Brother   . Heart disease Father   . Hyperlipidemia Father   . Hypertension Father   . Aortic aneurysm Father   . Early death Father 59  . Hypertension Sister   . Diabetes type II    . Breast cancer Maternal Aunt   . Asthma Brother   . Heart disease Brother    Past Surgical History:  Procedure Laterality Date  . Bladder dilitation     x 3  . BREAST BIOPSY  2011   Benign histology  . CARDIOVASCULAR STRESS TEST  2000   Unremarkable per pt report  . CARPOMETACARPAL JOINT ARTHROTOMY Right 2011  . COLONOSCOPY    . COLONOSCOPY WITH PROPOFOL N/A 04/21/2015   Procedure: COLONOSCOPY WITH PROPOFOL;  Surgeon: Carol Ada, MD;  Location: [facility] ENDOSCOPY;  Service: Endoscopy;  Laterality: N/A;  . EPIDURAL BLOCK INJECTION Left 04/12/2016   Left Medial Nerve Block and Left L5 ramus block, Dr Suella Broad   . EPIDURAL BLOCK INJECTION  03/21/2016   Left L3-4 medial branch block and Left L5 & dorsal ramus block   . FECAL TRANSPLANT  04/21/2015   Procedure: FECAL TRANSPLANT;  Surgeon: Carol Ada, MD;  Location: [place];  Service: Endoscopy;;  . INJECTION HIP INTRA ARTICULAR Left 11/2015   for OA by Dr Suella Broad  . OTHER SURGICAL HISTORY Left 2016   Left L3/L4 medial nerve block and Left L5 Dorsal Ramus block Dr Mickel Duhamel  . TOTAL HIP ARTHROPLASTY Left 07/25/2016   Procedure: LEFT TOTAL HIP ARTHROPLASTY ANTERIOR APPROACH;  Surgeon: Paralee Cancel, MD;  Location: [facility];  Service: Orthopedics;  Laterality: Left;   Social History   Social History Narrative   Prior PCP With [facility] at [place], [place].   Married, lives with Stafford Courthouse, (b. 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    Wears seatbelt usually   No religious beliefs affecting healthcare   No drinking of more than 4 alcoholic drinks at a time   No difficulty taking medications as directed.       Home has working smoke alarm   No home throw rugs   Does not have nonslip bathtub / shower    Has railings on all stairs   Home is free from [place].       No history of Hospitalization as of 06/2015.       Best number to reach patient [phone] (M) as of 07/01/15   It is permissible to leave messages as of 07/01/15 .    No regular exercise of 3 times a week for 30 minutes at a time      Molly Giles has Advanced Directive and a Doctor, general practice is husband, Darlisa Walkenhorst [phone]     Objective: Vital Signs: There were no vitals taken for this visit.   Physical Exam   Musculoskeletal Exam: ***  CDAI Exam: No CDAI exam completed.    Investigation: No additional findings.   Imaging: No results found.  Speciality Comments: No specialty comments available.    Procedures:  No procedures performed Allergies: Codeine; Hibiclens [chlorhexidine gluconate]; Lyrica [pregabalin]; Sulfa antibiotics; Betadine [povidone iodine]; Latex; and Wellbutrin [bupropion]   Assessment / Plan:     Visit Diagnoses: Fibromyalgia  History of migraine headaches  History of irritable bowel syndrome    Orders: No orders of the defined types were placed in this encounter.  No orders of the defined types were placed in this encounter.   Face-to-face time spent with patient was *** minutes. 50% of time was spent in counseling and coordination of care.  Follow-Up  Instructions: No Follow-up on file.   Amy Littrell, RT

## 2016-09-07 ENCOUNTER — Other Ambulatory Visit: Payer: Self-pay | Admitting: Family Medicine

## 2016-09-07 ENCOUNTER — Ambulatory Visit: Payer: No Typology Code available for payment source | Admitting: Rheumatology

## 2016-09-07 DIAGNOSIS — Z1231 Encounter for screening mammogram for malignant neoplasm of breast: Secondary | ICD-10-CM

## 2016-10-03 ENCOUNTER — Ambulatory Visit
Admission: RE | Admit: 2016-10-03 | Discharge: 2016-10-03 | Disposition: A | Discharge status: Home / Self Care | Payer: BLUE CROSS/BLUE SHIELD | Source: Ambulatory Visit | Attending: Family Medicine | Admitting: Family Medicine

## 2016-10-03 DIAGNOSIS — Z1231 Encounter for screening mammogram for malignant neoplasm of breast: Secondary | ICD-10-CM

## 2016-10-05 ENCOUNTER — Other Ambulatory Visit: Payer: Self-pay | Admitting: Family Medicine

## 2016-10-05 DIAGNOSIS — R928 Other abnormal and inconclusive findings on diagnostic imaging of breast: Secondary | ICD-10-CM

## 2016-10-08 ENCOUNTER — Other Ambulatory Visit: Payer: Self-pay | Admitting: Rheumatology

## 2016-10-08 DIAGNOSIS — F329 Major depressive disorder, single episode, unspecified: Secondary | ICD-10-CM

## 2016-10-08 DIAGNOSIS — F32A Depression, unspecified: Secondary | ICD-10-CM

## 2016-10-09 ENCOUNTER — Telehealth: Payer: Self-pay | Admitting: Rheumatology

## 2016-10-09 ENCOUNTER — Ambulatory Visit
Admission: RE | Admit: 2016-10-09 | Discharge: 2016-10-09 | Disposition: A | Discharge status: Home / Self Care | Payer: BLUE CROSS/BLUE SHIELD | Source: Ambulatory Visit | Attending: Family Medicine | Admitting: Family Medicine

## 2016-10-09 ENCOUNTER — Other Ambulatory Visit: Payer: Self-pay | Admitting: Family Medicine

## 2016-10-09 DIAGNOSIS — N6310 Unspecified lump in the right breast, unspecified quadrant: Secondary | ICD-10-CM

## 2016-10-09 DIAGNOSIS — R928 Other abnormal and inconclusive findings on diagnostic imaging of breast: Secondary | ICD-10-CM

## 2016-10-09 NOTE — Telephone Encounter (Signed)
LMOM for patient to call back to schedule follow up.

## 2016-10-09 NOTE — Telephone Encounter (Signed)
-----   Message from Carole Binning, LPN sent at [phone]  9:22 AM EST ----- Regarding: Please schedule patient a follow up visit Please schedule patient a follow up visit. Patient was due October 2017. Thanks!!

## 2016-10-09 NOTE — Telephone Encounter (Signed)
Last Visit: 12/28/15 Next Visit was due October 2017. Message sent to the front to schedule patient.   Okay to refill Celexa?

## 2016-10-11 ENCOUNTER — Ambulatory Visit
Admission: RE | Admit: 2016-10-11 | Discharge: 2016-10-11 | Disposition: A | Discharge status: Home / Self Care | Payer: BLUE CROSS/BLUE SHIELD | Source: Ambulatory Visit | Attending: Family Medicine | Admitting: Family Medicine

## 2016-10-11 ENCOUNTER — Other Ambulatory Visit: Payer: Self-pay | Admitting: Family Medicine

## 2016-10-11 DIAGNOSIS — N6001 Solitary cyst of right breast: Secondary | ICD-10-CM

## 2016-10-11 DIAGNOSIS — N6310 Unspecified lump in the right breast, unspecified quadrant: Secondary | ICD-10-CM

## 2016-10-15 ENCOUNTER — Other Ambulatory Visit: Payer: Self-pay | Admitting: Rheumatology

## 2016-10-15 DIAGNOSIS — M797 Fibromyalgia: Secondary | ICD-10-CM

## 2016-10-16 NOTE — Telephone Encounter (Signed)
12/28/15 last visit 11/16/16 next visit  Ok to refill Ambien ?

## 2016-10-17 ENCOUNTER — Telehealth: Payer: Self-pay | Admitting: Rheumatology

## 2016-10-17 NOTE — Telephone Encounter (Signed)
Patient states she went to pick up zolpidem from the pharmacy and she was told she needs an actual prescription to take to the pharmacy. Patient is requesting to pick that up today please.

## 2016-10-17 NOTE — Telephone Encounter (Signed)
Prescription has been left at the front desk and patient advised.

## 2016-10-25 HISTORY — PX: EPIDURAL BLOCK INJECTION: SHX1516

## 2016-11-09 DIAGNOSIS — G4709 Other insomnia: Secondary | ICD-10-CM | POA: Insufficient documentation

## 2016-11-09 DIAGNOSIS — M25512 Pain in left shoulder: Secondary | ICD-10-CM | POA: Insufficient documentation

## 2016-11-09 HISTORY — DX: Pain in left shoulder: M25.512

## 2016-11-09 HISTORY — DX: Other insomnia: G47.09

## 2016-11-09 NOTE — Progress Notes (Signed)
Office Visit Note  Patient: Molly Giles             Date of Birth: Sep 11, 1956           MRN: [id number]             PCP: Acquanetta Sit, MD Referring: McDiarmid, Blane Ohara, MD Visit Date: 11/13/2016 Occupation: '@GUAROCC' @    Subjective:  Follow-up FMS, Left hip pain from (bursitis vs LEFT Total Hip Replacement)  History of Present Illness: Molly Giles is a 61 y.o. female   Fibromyalgia, fatigue, insomnia, left hip pain.  Patient rates her fibromyalgia discomfort as 6 on a scale of 0-10. She has ongoing fatigue and insomnia.  Her main complaint today is her left hip pain. Patient recently had left total hip replacement approximately November 2017 through Dr. Aurea Graff office. Patient states that the surgery went well but she has ongoing left hip pain. She is followed up with Dr.: Several times and they've done follow-up x-rays and they reassured the patient that the hardware is intact and everything is normal as far as it relates to the surgery. She had 2 weeks of home physical therapy. Once physical therapy was completed, they felt that patient was doing well and he did not offer her any further physical therapy. With ongoing left hip pain, patient asked for her prescription for physical therapy. She recently got that prescription and she's been seeing a physical therapist.  Currently she is complaining of pain to the left greater trochanter bursa area that she rates about 8-9 on a scale of 0-10. This pain affected her recent trip to [place]. She had significant amount of pain that she was about to cancel her trip. On fortunately, they had paid for the trip in its entirety and she did not feel that'll be wise to waste of money and did go to [place] as scheduled. She ended up having to use a cane throughout her trip due to the discomfort to her left hip.  Activities of Daily Living:  Patient reports morning stiffness for 30 minutes.   Patient Reports nocturnal pain.  Difficulty  dressing/grooming: Denies Difficulty climbing stairs: Reports Difficulty getting out of chair: Reports Difficulty using hands for taps, buttons, cutlery, and/or writing: Denies   Review of Systems  Constitutional: Negative for fatigue.  HENT: Negative for mouth sores and mouth dryness.   Eyes: Negative for dryness.  Respiratory: Negative for shortness of breath.   Gastrointestinal: Negative for constipation and diarrhea.  Musculoskeletal: Negative for myalgias and myalgias.  Skin: Negative for sensitivity to sunlight.  Psychiatric/Behavioral: Negative for decreased concentration and sleep disturbance.    PMFS History:  Patient Active Problem List   Diagnosis Date Noted  . Other fatigue 11/09/2016  . Other insomnia 11/09/2016  . Pain in joint of left shoulder 11/09/2016  . S/P left THA, AA 07/25/2016  . Irritable bowel syndrome with diarrhea 04/03/2016  . Chronic migraine 02/25/2016  . Left ventricular hypertrophy, mild 02/25/2016  . History of irritable bowel syndrome 02/15/2016  . 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 Osteoarthritis of left hip 11/01/2015  . Hearing loss of both ears 07/27/2015  . Post concussive syndrome 07/14/2015  . Musculoskeletal neck pain 07/14/2015  . Depression (NOS) 07/02/2015  . Allergic rhinoconjunctivitis 07/02/2015  . Fibromyalgia syndrome 07/01/2015  . History of Clostridium difficile colitis, persistent 07/01/2015  . Impairment of balance 02/10/2015  . Essential hypertension 05/15/2013  . Hypothyroidism 05/15/2013  . Hyperlipidemia 05/15/2013  . Interstitial  cystitis 05/15/2013  . History of migraine headaches 05/15/2013  . Spondylosis of lumbar region without myelopathy or radiculopathy 10/30/2011    Past Medical History:  Diagnosis Date  . Allergic rhinoconjunctivitis 07/02/2015  . Arthritis   . Benign positional vertigo 04/2015   Responded well to V[facility]ibular Rehab  . Bruxism (teeth grinding)   . Chronic migraine 02/25/2016     Per Dr Jaynee Eagles review in notes from [facility] from September 2015. Showed total headache days last month 18. Severe headache days 7 days. Moderate headache days 5 days. Mild headache days last month sick days. Days without headache last month 10 days. Symptoms associated with photophobia, phonophobia, osmophobia, neck pain, dizziness, jaw pain, nasal congestion, vision disturbances, tingling and numbness, weakness and worsening with activity. Each headache attack last 3 hours depending on treatment in severity. Left side, the right side, easier side, the frontal area in the back of the head. Characterized as throbbing, pressure, tightness, squeezing, stabbing and burning   . Chronic migraine w/o aura w/o status migrainosus, not intractable 07/14/2015  . DDD (degenerative disc disease), lumbar   . Degenerative cervical disc   . Depression   . Dysrhythmia    seen by dr Wynonia Lawman- not a problem since she has been on Bystolic  . Essential hypertension 05/15/2013  . Family history of adverse reaction to anesthesia    Brother- N/V  . Family history of premature CAD 05/15/2013  . Fibromyalgia   . GERD (gastroesophageal reflux disease) 12/16/2014  . H/O seasonal allergies   . Hearing loss of both ears 07/27/2015   mild to borderline moderate low frequency hearing loss improving to within normal limits bilaterally on audiology testing at [facility] in November 2016.    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 History of irritable bowel syndrome 02/15/2016  . History of migraine headaches 05/15/2013   Per Dr Jaynee Eagles review in notes from [facility] from September 2015. Showed total headache days last month 18. Severe headache days 7 days. Moderate headache days 5 days. Mild headache days last month sick days. Days without headache last month 10 days. Symptoms associated with photophobia, phonophobia, osmophobia, neck  pain, dizziness, jaw pain, nasal congestion, vision disturbances, tingling and numbness, weakness and worsening with activity. Each headache attack last 3 hours depending on treatment in severity. Left side, the right side, easier side, the frontal area in the back of the head. Characterized as throbbing, pressure, tightness, squeezing, stabbing and burning    . Hx of bad fall 02/2015   Severe Facial/head trauma without fracture  . Hyperlipidemia 1998  . Hypertension 2004  . Hypothyroidism   . Impairment of balance 02/2015   Consequent of postconcussive syndrome  . Interstitial cystitis   . 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 Migraine 1962   Chronic Migraines  . Mood disorder ([place])   . Musculoskeletal neck pain 07/14/2015  . Normal coronary arteries 05/14/2014  . Osteoarthritis of left hip 11/01/2015   MRI order by Dr Alvan Dame (ortho) 10/2015 showed significant arthritis of left hip joint with cystic changes in femoral head c/w osteoarthritis  . 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 Overweight   . Palpitations 05/15/2013   [facility] manages  . PONV (postoperative nausea and vomiting)   . Post concussion syndrome 06/06/2015  . Post concussive syndrome 07/14/2015   Ms Depaz's post-concussive syndrome manifesting in vertigo and headache,  mood changes, poor balance, dizziness, and decreased concentration per Dr Jaynee Eagles at [facility].   . Shortness of breath dyspnea    with exertion  . Thyroid nodule 08/11/2009   Findings: The thyroid gland is within normal limits in size.  The gland is diffusely inhomogeneous. A small solid nodule is noted in the lower pole  medially on the right of 7 x 6 x 8 mm. A small solid nodule is noted inferiorly on the left of 3 x 3 x 4 mm.  IMPRESSION:  The thyroid gland is within normal limits in size with only small solid nodules present, the largest of only 8 mm in diameter on the right.      Family  History  Problem Relation Age of Onset  . Alzheimer's disease Mother   . Hyperlipidemia Mother   . Hypertension Mother   . Osteoporosis Mother   . Parkinson's disease Mother   . Migraines Sister   . Allergies Sister   . Hyperlipidemia Brother   . Cardiomyopathy Brother   . Diabetes type II Brother   . Kidney disease Brother   . Hypertension Brother   . Hyperlipidemia Brother   . Kidney disease Brother   . Heart disease Father   . Hyperlipidemia Father   . Hypertension Father   . Aortic aneurysm Father   . Early death Father 64  . Hypertension Sister   . Diabetes type II    . Breast cancer Maternal Aunt   . Asthma Brother   . Heart disease Brother    Past Surgical History:  Procedure Laterality Date  . Bladder dilitation     x 3  . BREAST BIOPSY  2011   Benign histology  . CARDIOVASCULAR STRESS TEST  2000   Unremarkable per pt report  . CARPOMETACARPAL JOINT ARTHROTOMY Right 2011  . COLONOSCOPY    . COLONOSCOPY WITH PROPOFOL N/A 04/21/2015   Procedure: COLONOSCOPY WITH PROPOFOL;  Surgeon: Carol Ada, MD;  Location: [place];  Service: Endoscopy;  Laterality: N/A;  . EPIDURAL BLOCK INJECTION Left 04/12/2016   Left Medial Nerve Block and Left L5 ramus block, Dr Suella Broad   . EPIDURAL BLOCK INJECTION  03/21/2016   Left L3-4 medial branch block and Left L5 & dorsal ramus block   . FECAL TRANSPLANT  04/21/2015   Procedure: FECAL TRANSPLANT;  Surgeon: Carol Ada, MD;  Location: [place];  Service: Endoscopy;;  . INJECTION HIP INTRA ARTICULAR Left 11/2015   for OA by Dr Suella Broad  . OTHER SURGICAL HISTORY Left 2016   Left L3/L4 medial nerve block and Left L5 Dorsal Ramus block Dr Mickel Duhamel  . TOTAL HIP ARTHROPLASTY Left 07/25/2016   Procedure: LEFT TOTAL HIP ARTHROPLASTY ANTERIOR APPROACH;  Surgeon: Paralee Cancel, MD;  Location: [facility];  Service: Orthopedics;  Laterality: Left;   Social History   Social History Narrative   Prior PCP With [facility] at [place], [address] [place].   Married, lives with Eden, (b. 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    Wears seatbelt usually   No religious beliefs affecting healthcare   No drinking of more than 4 alcoholic drinks at a time   No difficulty taking medications as directed.       Home has working smoke alarm   No home throw rugs   Does not have nonslip bathtub / shower    Has railings on all  stairs   Home is free from [place].       No history of Hospitalization as of 06/2015.       Best number to reach patient [phone] (M) as of 07/01/15   It is permissible to leave messages as of 07/01/15 .    No regular exercise of 3 times a week for 30 minutes at a time      Kenzleigh Sedam has Advanced Directive and a Doctor, general practice is husband, Johnnette Laux [phone]     Objective: Vital Signs: BP 118/87   Pulse 87   Resp 13   Ht 5' 6.5" (1.689 m)   Wt 199 lb (90.3 kg)   BMI 31.64 kg/m    Physical Exam  Constitutional: She is oriented to person, place, and time. She appears well-developed and well-nourished.  HENT:  Head: Normocephalic and atraumatic.  Eyes: EOM are normal. Pupils are equal, round, and reactive to light.  Cardiovascular: Normal rate, regular rhythm and normal heart sounds.  Exam reveals no gallop and no friction rub.   No murmur heard. Pulmonary/Chest: Effort normal and breath sounds normal. She has no wheezes. She has no rales.  Abdominal: Soft. Bowel sounds are normal. She exhibits no distension. There is no tenderness. There is no guarding. No hernia.  Musculoskeletal: Normal range of motion. She exhibits no edema, tenderness or deformity.  Lymphadenopathy:    She has no cervical adenopathy.  Neurological: She is alert and oriented to person, place, and time. Coordination normal.  Skin: Skin is warm and dry. Capillary refill takes less than 2 seconds. No rash noted.   Psychiatric: She has a normal mood and affect. Her behavior is normal.  Nursing note and vitals reviewed.    Musculoskeletal Exam:  Full range of motion of all joints except left hip has decreased range of motion secondary to pain in the upper thigh and lateral thigh at the greater trochanter bursa area. Grip strength is equal and strong bilaterally Fiber myalgia tender points are 6 out of 18 positive (bilateral trapezius bilateral medial knees and bilateral greater trochanter bursa  CDAI Exam: CDAI Homunculus Exam:   Joint Counts:  CDAI Tender Joint count: 0 CDAI Swollen Joint count: 0  Global Assessments:  Patient Global Assessment: 8 Provider Global Assessment: 8  CDAI Calculated Score: 16    Investigation: Findings:  Labs from July 01, 2015 shows CMP is normal, HIV 1 and 2 is negative, HCV is negative.  Note:  These labs were ordered by her PCP  07/25/2016-LEFT TOTAL HIP ARTHROPLASTY   Admission on 07/25/2016, Discharged on 07/26/2016  Component Date Value Ref Range Status  . MRSA, PCR 07/25/2016 NEGATIVE  NEGATIVE Final  . Staphylococcus aureus 07/25/2016 NEGATIVE  NEGATIVE Final   Comment:        The Xpert SA Assay (FDA approved for NASAL specimens in patients over 34 years of age), is one component of a comprehensive surveillance program.  Test performance has been validated by [facility] for patients greater than or equal to 54 year old. It is not intended to diagnose infection nor to guide or monitor treatment.   . WBC 07/26/2016 15.6* 4.0 - 10.5 K/uL Final  . RBC 07/26/2016 3.73* 3.87 - 5.11 MIL/uL Final  . Hemoglobin 07/26/2016 10.9* 12.0 - 15.0 g/dL Final  . HCT 07/26/2016 33.6* 36.0 - 46.0 % Final  . MCV 07/26/2016 90.1  78.0 - 100.0 fL Final  . MCH 07/26/2016 29.2  26.0 - 34.0  pg Final  . MCHC 07/26/2016 32.4  30.0 - 36.0 g/dL Final  . RDW 07/26/2016 14.6  11.5 - 15.5 % Final  . Platelets 07/26/2016 236  150 - 400 K/uL Final  . Sodium  07/26/2016 140  135 - 145 mmol/L Final  . Potassium 07/26/2016 4.6  3.5 - 5.1 mmol/L Final  . Chloride 07/26/2016 110  101 - 111 mmol/L Final  . CO2 07/26/2016 25  22 - 32 mmol/L Final  . Glucose, Bld 07/26/2016 132* 65 - 99 mg/dL Final  . BUN 07/26/2016 10  6 - 20 mg/dL Final  . Creatinine, Ser 07/26/2016 0.62  0.44 - 1.00 mg/dL Final  . Calcium 07/26/2016 8.8* 8.9 - 10.3 mg/dL Final  . GFR calc non Af Amer 07/26/2016 >60  >60 mL/min Final  . GFR calc Af Amer 07/26/2016 >60  >60 mL/min Final   Comment: (NOTE) The eGFR has been calculated using the CKD EPI equation. This calculation has not been validated in all clinical situations. eGFR's persistently <60 mL/min signify possible Chronic Kidney Disease.   Georgiann Hahn gap 07/26/2016 5  5 - 15 Final  Hospital Outpatient Visit on 07/17/2016  Component Date Value Ref Range Status  . WBC 07/17/2016 8.2  4.0 - 10.5 K/uL Final  . RBC 07/17/2016 4.48  3.87 - 5.11 MIL/uL Final  . Hemoglobin 07/17/2016 13.0  12.0 - 15.0 g/dL Final  . HCT 07/17/2016 40.6  36.0 - 46.0 % Final  . MCV 07/17/2016 90.6  78.0 - 100.0 fL Final  . MCH 07/17/2016 29.0  26.0 - 34.0 pg Final  . MCHC 07/17/2016 32.0  30.0 - 36.0 g/dL Final  . RDW 07/17/2016 14.9  11.5 - 15.5 % Final  . Platelets 07/17/2016 294  150 - 400 K/uL Final  . Sodium 07/17/2016 139  135 - 145 mmol/L Final  . Potassium 07/17/2016 3.9  3.5 - 5.1 mmol/L Final  . Chloride 07/17/2016 105  101 - 111 mmol/L Final  . CO2 07/17/2016 29  22 - 32 mmol/L Final  . Glucose, Bld 07/17/2016 98  65 - 99 mg/dL Final  . BUN 07/17/2016 15  6 - 20 mg/dL Final  . Creatinine, Ser 07/17/2016 0.72  0.44 - 1.00 mg/dL Final  . Calcium 07/17/2016 9.4  8.9 - 10.3 mg/dL Final  . GFR calc non Af Amer 07/17/2016 >60  >60 mL/min Final  . GFR calc Af Amer 07/17/2016 >60  >60 mL/min Final   Comment: (NOTE) The eGFR has been calculated using the CKD EPI equation. This calculation has not been validated in all clinical  situations. eGFR's persistently <60 mL/min signify possible Chronic Kidney Disease.   . Anion gap 07/17/2016 5  5 - 15 Final  . ABO/RH(D) 07/17/2016 O POS   Final  . Antibody Screen 07/17/2016 NEG   Final  . Sample Expiration 07/17/2016 07/28/2016   Final  . Extend sample reason 07/17/2016 NO TRANSFUSIONS OR PREGNANCY IN THE PAST 3 MONTHS   Final  . ABO/RH(D) 07/17/2016 O POS   Final      Imaging: No results found.  Speciality Comments: No specialty comments available.    Procedures:  No procedures performed Allergies: Codeine; Hibiclens [chlorhexidine gluconate]; Lyrica [pregabalin]; Sulfa antibiotics; Betadine [povidone iodine]; Latex; and Wellbutrin [bupropion]   Assessment / Plan:     Visit Diagnoses: Fibromyalgia syndrome  Other fatigue  Other insomnia - Ambien  Pain in joint of left shoulder  Musculoskeletal neck pain  Fibromyalgia - Plan: zolpidem (AMBIEN) 10  MG tablet  Other depression - Plan: citalopram (CELEXA) 20 MG tablet   Plan: #1: Fibromyalgia. Patient's pain is 6 on a scale of 0-10.  #2: Fatigue and insomnia. Ongoing. Patient relies on Ambien daily at bedtime for sleep. Ambien worked well for the patient.  #3: Status post left total hip replacement through Dr. Bevelyn Buckles office November 2017. Patient has ongoing pain to the left hip and is unsure where the pain is coming from. She discussed this with Dr. Ihor Gully on several visits but they reassure her that her x-rays look fine and her surgery looks fine. Patient has a history of left greater trochanter bursitis prior to her surgery. On today's examination her left greater trochanter bursa is painful.  #4: Left greater trochanteric bursitis After informed consent was obtained the site was prepped in usual fashion and injected with 40 mg of Kenalog mixed with one half mL's 1% lidocaine without epinephrine. Patient tolerated procedure well. There are no palpitations  #5: Refill citalopram 20 mg 1 by mouth  daily ninety-day supply with a refill Refill zolpidem 10 mg ninety-day supply with 1 refill  #6: CBC with differential and CMP with GFR within normal limits and up-to-date.  #7: Return to clinic in 6 months  #8: Patient's left knee is painful. Back in March 2013, we said that she had left knee joint osteoarthritis. We gave her cortisone injection which helped her at that time. I suspect that she will do well with physical supplementation in the future. She discussed the left knee pain with her orthopedist, Dr. Alvan Dame. They did x-ray and patient will bring [name] a copy of that x-ray. If appropriate, I may offer the patient Visco supplementation into the left knee and the right knee. She will bring [name] a copy of the x-ray so that we can make that determination. She has failed cortisone in the past, weight loss in the past, and site Orders: No orders of the defined types were placed in this encounter.  Meds ordered this encounter  Medications  . zolpidem (AMBIEN) 10 MG tablet    Sig: Take 1 tablet (10 mg total) by mouth at bedtime as needed.    Dispense:  90 tablet    Refill:  1  . citalopram (CELEXA) 20 MG tablet    Sig: Take 1 tablet (20 mg total) by mouth daily.    Dispense:  90 tablet    Refill:  1    Face-to-face time spent with patient was 30 minutes. 50% of time was spent in counseling and coordination of care.  Follow-Up Instructions: Return in about 5 months (around 04/15/2017) for FMS, FATIGUE,INSOMNIA. left thr, left bursitis.   Eliezer Lofts, PA-C  Note - This record has been created using [employer].  Chart creation errors have been sought, but may not always  have been located. Such creation errors do not reflect on  the standard of medical care.

## 2016-11-13 ENCOUNTER — Encounter: Payer: Self-pay | Admitting: Rheumatology

## 2016-11-13 ENCOUNTER — Ambulatory Visit (INDEPENDENT_AMBULATORY_CARE_PROVIDER_SITE_OTHER): Payer: BLUE CROSS/BLUE SHIELD | Admitting: Rheumatology

## 2016-11-13 VITALS — BP 118/87 | HR 87 | Resp 13 | Ht 66.5 in | Wt 199.0 lb

## 2016-11-13 DIAGNOSIS — G4709 Other insomnia: Secondary | ICD-10-CM | POA: Diagnosis not present

## 2016-11-13 DIAGNOSIS — M797 Fibromyalgia: Secondary | ICD-10-CM | POA: Diagnosis not present

## 2016-11-13 DIAGNOSIS — F3289 Other specified depressive episodes: Secondary | ICD-10-CM | POA: Diagnosis not present

## 2016-11-13 DIAGNOSIS — R5383 Other fatigue: Secondary | ICD-10-CM

## 2016-11-13 DIAGNOSIS — M25512 Pain in left shoulder: Secondary | ICD-10-CM

## 2016-11-13 DIAGNOSIS — M542 Cervicalgia: Secondary | ICD-10-CM

## 2016-11-13 MED ORDER — CITALOPRAM HYDROBROMIDE 20 MG PO TABS: 20.0000 mg | ORAL_TABLET | Freq: Every day | ORAL | 1 refills | Status: DC

## 2016-11-13 MED ORDER — ZOLPIDEM TARTRATE 10 MG PO TABS
10.0000 mg | ORAL_TABLET | Freq: Every evening | ORAL | 1 refills | Status: DC | PRN
Start: 2016-11-13 — End: 2017-01-18

## 2016-11-24 ENCOUNTER — Telehealth: Payer: Self-pay | Admitting: Rheumatology

## 2016-11-24 NOTE — Telephone Encounter (Signed)
Patient called stating that she received a letter from her insurance stating that they are going to reduce the amount she can receive each month of her Zolpidem to 15 pills instead of 30.  She is needing Mr. Carlyon Shadow to call them at [phone] to dispute that she only needs 15 for a month. [phone].  Thank you.

## 2016-11-27 NOTE — Telephone Encounter (Signed)
Are you okay if we proceed to try to get this medication overwritten for 30 tablets per month through her insurance?

## 2016-11-27 NOTE — Telephone Encounter (Signed)
Since patient only needs 15 pills per month, we can rewrite it that way so that it does not need to be overridden.If it's easier to override it, that can be done too

## 2016-11-28 NOTE — Telephone Encounter (Signed)
Patient advised we would not be overriding the prescription amount set out by the insurance.

## 2016-11-28 NOTE — Telephone Encounter (Signed)
Attempted to contact the patient and left message for patient to call the office.  

## 2016-12-03 ENCOUNTER — Other Ambulatory Visit: Payer: Self-pay | Admitting: Cardiovascular Disease

## 2016-12-05 ENCOUNTER — Ambulatory Visit: Payer: BLUE CROSS/BLUE SHIELD | Admitting: Family Medicine

## 2016-12-05 NOTE — Telephone Encounter (Signed)
REFILL 

## 2016-12-18 ENCOUNTER — Other Ambulatory Visit: Payer: Self-pay | Admitting: *Deleted

## 2016-12-18 MED ORDER — ROSUVASTATIN CALCIUM 10 MG PO TABS: 10.0000 mg | ORAL_TABLET | Freq: Every day | ORAL | 3 refills | Status: DC

## 2016-12-19 ENCOUNTER — Other Ambulatory Visit: Payer: Self-pay

## 2016-12-19 MED ORDER — AMLODIPINE BESYLATE 2.5 MG PO TABS: 2.5000 mg | ORAL_TABLET | Freq: Every day | ORAL | 0 refills | Status: DC

## 2016-12-19 NOTE — Telephone Encounter (Signed)
Rx(s) sent to pharmacy electronically.  

## 2016-12-29 ENCOUNTER — Other Ambulatory Visit: Payer: Self-pay | Admitting: Cardiovascular Disease

## 2016-12-29 ENCOUNTER — Other Ambulatory Visit: Payer: Self-pay | Admitting: Rheumatology

## 2016-12-29 DIAGNOSIS — Z5181 Encounter for therapeutic drug level monitoring: Secondary | ICD-10-CM

## 2017-01-01 NOTE — Telephone Encounter (Signed)
Last Visit: 11/13/16 Next visit 04/16/17 UDS: 10/20/15 Narc Agreement 09/17/13  Message sent to patient to see when she can come in to update these

## 2017-01-02 ENCOUNTER — Telehealth: Payer: Self-pay | Admitting: Radiology

## 2017-01-02 NOTE — Telephone Encounter (Signed)
Refill request received via fax for  Tramadol [facility] [place]

## 2017-01-02 NOTE — Telephone Encounter (Signed)
This is a duplicate request pt needs labs and updated narcotic agreement.

## 2017-01-03 NOTE — Telephone Encounter (Signed)
My chart message not read yet so I called patient. Left message for her to call back

## 2017-01-03 NOTE — Telephone Encounter (Signed)
Patient states she has to pay for the UDS and does not know when she will be able to do this. She is upset she states her insurance will not cover the lab test. I explained this is a standard of care we expect from all patients on narcotics, not just her.   She will call us back and let us know when she can come in for the UDS and the Narcotic agreement.

## 2017-01-10 ENCOUNTER — Telehealth: Payer: Self-pay | Admitting: *Deleted

## 2017-01-10 ENCOUNTER — Other Ambulatory Visit: Payer: Self-pay | Admitting: *Deleted

## 2017-01-10 DIAGNOSIS — Z5181 Encounter for therapeutic drug level monitoring: Secondary | ICD-10-CM

## 2017-01-10 NOTE — Telephone Encounter (Signed)
Patient going to have UDS update and needs orders released. Orders have been released and faxed. Patient reminded she also needs to updated her narcotic agreement.

## 2017-01-11 ENCOUNTER — Other Ambulatory Visit: Payer: Self-pay | Admitting: Rheumatology

## 2017-01-12 LAB — PAIN MGMT, PROFILE 5 W/CONF, U
Amphetamines: NEGATIVE ng/mL (ref <500)
BARBITURATES: NEGATIVE ng/mL (ref <300)
Benzodiazepines: NEGATIVE ng/mL (ref <100)
Cocaine Metabolite: NEGATIVE ng/mL (ref <150)
Creatinine: 43.1 mg/dL (ref >=20.0)
MARIJUANA METABOLITE: NEGATIVE ng/mL (ref <20)
METHADONE METABOLITE: NEGATIVE ng/mL (ref <100)
OXYCODONE: NEGATIVE ng/mL (ref <100)
Opiates: NEGATIVE ng/mL (ref <100)
Oxidant: NEGATIVE ug/mL (ref <200)
pH: 6.91 (ref 4.5–9.0)

## 2017-01-13 NOTE — Progress Notes (Signed)
C/w tt

## 2017-01-15 ENCOUNTER — Ambulatory Visit (INDEPENDENT_AMBULATORY_CARE_PROVIDER_SITE_OTHER): Payer: BLUE CROSS/BLUE SHIELD | Admitting: Neurology

## 2017-01-15 ENCOUNTER — Encounter: Payer: Self-pay | Admitting: Neurology

## 2017-01-15 VITALS — BP 126/91 | HR 92 | Ht 66.5 in | Wt 201.8 lb

## 2017-01-15 DIAGNOSIS — R0683 Snoring: Secondary | ICD-10-CM

## 2017-01-15 DIAGNOSIS — G43711 Chronic migraine without aura, intractable, with status migrainosus: Secondary | ICD-10-CM | POA: Diagnosis not present

## 2017-01-15 DIAGNOSIS — R51 Headache: Secondary | ICD-10-CM

## 2017-01-15 DIAGNOSIS — R519 Headache, unspecified: Secondary | ICD-10-CM

## 2017-01-15 MED ORDER — TOPIRAMATE ER 25 MG PO SPRINKLE CAP24: EXTENDED_RELEASE_CAPSULE | ORAL | 6 refills | Status: DC
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
## 2017-01-15 NOTE — Patient Instructions (Signed)
Remember to drink plenty of fluid, eat healthy meals and do not skip any meals. Try to eat protein with a every meal and eat a healthy snack such as fruit or nuts in between meals. Try to keep a regular sleep-wake schedule and try to exercise daily, particularly in the form of walking, 20-30 minutes a day, if you can.   As far as your medications are concerned, I would like to suggest  Topiramate ER:  First week: 25mg  at night Second week: 50mg  at night Third week: 75mg  at nigh Then increase to 100mg  (4 pills at night)  As far as diagnostic testing: Sleep evaluation, Integrative therapies  Continue Relpax  I would like to see you back in 3 months, sooner if we need to. Please call us with any interim questions, concerns, problems, updates or refill requests.   Our phone number is [phone]. We also have an after hours call service for urgent matters and there is a physician on-call for urgent questions. For any emergencies you know to call 911 or go to the nearest emergency room  Topiramate extended-release capsules What is this medicine? TOPIRAMATE (toe PYRE a mate) is used to treat seizures in adults or children with epilepsy. It is also used for the prevention of migraine headaches. This medicine may be used for other purposes; ask your health care provider or pharmacist if you have questions. COMMON BRAND NAME(S): Trokendi XR What should I tell my health care provider before I take this medicine? They need to know if you have any of these conditions: -cirrhosis of the liver or liver disease -diarrhea -glaucoma -kidney stones or kidney disease -lung disease like asthma, obstructive pulmonary disease, emphysema -metabolic acidosis -on a ketogenic diet -scheduled for surgery or a procedure -suicidal thoughts, plans, or attempt; a previous suicide attempt by you or a family member -an unusual or allergic reaction to topiramate, other medicines, foods, dyes, or  preservatives -pregnant or trying to get pregnant -breast-feeding How should I use this medicine? Take this medicine by mouth with a glass of water. Follow the directions on the prescription label. Trokendi XR capsules must be swallowed whole. Do not sprinkle on food, break, crush, dissolve, or chew. Qudexy XR capsules may be swallowed whole or opened and sprinkled on a small amount of soft food. This mixture must be swallowed immediately. Do not chew or store mixture for later use. You may take this medicine with meals. Take your medicine at regular intervals. Do not take it more often than directed. Talk to your pediatrician regarding the use of this medicine in children. Special care may be needed. While Trokendi XR may be prescribed for children as young as 6 years and Qudexy XR may be prescribed for children as young as 2 years for selected conditions, precautions do apply. Overdosage: If you think you have taken too much of this medicine contact a poison control center or emergency room at once. NOTE: This medicine is only for you. Do not share this medicine with others. What if I miss a dose? If you miss a dose, take it as soon as you can. If it is almost time for your next dose, take only that dose. Do not take double or extra doses. What may interact with this medicine? Do not take this medicine with any of the following medications: -probenecid This medicine may also interact with the following medications: -acetazolamide -alcohol -amitriptyline -birth control pills -digoxin -hydrochlorothiazide -lithium -medicines for pain, sleep, or muscle relaxation -metformin -methazolamide -  other seizure or epilepsy medicines -pioglitazone -risperidone This list may not describe all possible interactions. Give your health care provider a list of all the medicines, herbs, non-prescription drugs, or dietary supplements you use. Also tell them if you smoke, drink alcohol, or use illegal drugs.  Some items may interact with your medicine. What should I watch for while using this medicine? Visit your doctor or health care professional for regular checks on your progress. Do not stop taking this medicine suddenly. This increases the risk of seizures if you are using this medicine to control epilepsy. Wear a medical identification bracelet or chain to say you have epilepsy or seizures, and carry a card that lists all your medicines. This medicine can decrease sweating and increase your body temperature. Watch for signs of deceased sweating or fever, especially in children. Avoid extreme heat, hot baths, and saunas. Be careful about exercising, especially in hot weather. Contact your health care provider right away if you notice a fever or decrease in sweating. You should drink plenty of fluids while taking this medicine. If you have had kidney stones in the past, this will help to reduce your chances of forming kidney stones. If you have stomach pain, with nausea or vomiting and yellowing of your eyes or skin, call your doctor immediately. You may get drowsy, dizzy, or have blurred vision. Do not drive, use machinery, or do anything that needs mental alertness until you know how this medicine affects you. To reduce dizziness, do not sit or stand up quickly, especially if you are an older patient. Alcohol can increase drowsiness and dizziness. Avoid alcoholic drinks. Do not drink alcohol for 6 hours before or 6 hours after taking Trokendi XR. If you notice blurred vision, eye pain, or other eye problems, seek medical attention at once for an eye exam. The use of this medicine may increase the chance of suicidal thoughts or actions. Pay special attention to how you are responding while on this medicine. Any worsening of mood, or thoughts of suicide or dying should be reported to your health care professional right away. This medicine may increase the chance of developing metabolic acidosis. If left  untreated, this can cause kidney stones, bone disease, or slowed growth in children. Symptoms include breathing fast, fatigue, loss of appetite, irregular heartbeat, or loss of consciousness. Call your doctor immediately if you experience any of these side effects. Also, tell your doctor about any surgery you plan on having while taking this medicine since this may increase your risk for metabolic acidosis. Birth control pills may not work properly while you are taking this medicine. Talk to your doctor about using an extra method of birth control. Women who become pregnant while using this medicine may enroll in the [place] Pregnancy Registry by calling [id number]. This registry collects information about the safety of antiepileptic drug use during pregnancy. What side effects may I notice from receiving this medicine? Side effects that you should report to your doctor or health care professional as soon as possible: -allergic reactions like skin rash, itching or hives, swelling of the face, lips, or tongue -decreased sweating and/or rise in body temperature -depression -difficulty breathing, fast or irregular breathing patterns -difficulty speaking -difficulty walking or controlling muscle movements -hearing impairment -redness, blistering, peeling or loosening of the skin, including inside the mouth -tingling, pain or numbness in the hands or feet -unusually weak or tired -worsening of mood, thoughts or actions of suicide or dying Side effects that usually  do not require medical attention (report to your doctor or health care professional if they continue or are bothersome): -altered taste -back pain, joint or muscle aches and pains -diarrhea, or constipation -headache -loss of appetite -nausea -stomach upset, indigestion -tremors This list may not describe all possible side effects. Call your doctor for medical advice about side effects. You may report side  effects to FDA at 1-800-FDA-1088. Where should I keep my medicine? Keep out of the reach of children. Store at room temperature between 15 and 30 degrees C (59 and 86 degrees F) in a tightly closed container. Protect from moisture. Throw away any unused medicine after the expiration date. NOTE: This sheet is a summary. It may not cover all possible information. If you have questions about this medicine, talk to your doctor, pharmacist, or health care provider.  2018 Elsevier/Gold Standard (2015-12-17 12:33:11)

## 2017-01-18 ENCOUNTER — Telehealth: Payer: Self-pay | Admitting: Rheumatology

## 2017-01-18 DIAGNOSIS — M797 Fibromyalgia: Secondary | ICD-10-CM

## 2017-01-18 MED ORDER — TRAMADOL HCL 50 MG PO TABS: 100.0000 mg | ORAL_TABLET | Freq: Two times a day (BID) | ORAL | 0 refills | Status: DC

## 2017-01-18 MED ORDER — ZOLPIDEM TARTRATE 10 MG PO TABS: 10.0000 mg | ORAL_TABLET | Freq: Every evening | ORAL | 1 refills | Status: DC | PRN

## 2017-01-18 NOTE — Telephone Encounter (Signed)
Patient would like to know if we received UDS from [facility].   She also has questions regarding zolpidem rx. The rx was only written for 15 pills and she has questions about this since she takes 1 per day. Please call patient, she is requesting a call back today.   Patient updated Narc agreement today in the office.

## 2017-01-18 NOTE — Telephone Encounter (Signed)
Okay to give patient a prescription for Ambien 10 mg 1 by mouth daily at bedtime when necessary sleep; dispense 90 day supply with 1 refillShe can use good Rx coupon to get the medication.  Okay to refill tramadol

## 2017-01-18 NOTE — Telephone Encounter (Signed)
Please print prescriptions and sign. Thanks!

## 2017-01-18 NOTE — Telephone Encounter (Signed)
Patient states she is having trouble with the insurance only wanting to cover 15 Ambien every 2 months and she is not sleeping. Patient would like to know if we can write a new prescription for her so she can use the good rx coupon and take it to the pharmacy of her choice and pay cash for it.   Last Visit: 11/13/16 Next Visit: 04/16/17 UDS: 01/10/17 Narc Agreement: 01/18/17  Okay to refill tramadol?

## 2017-01-19 ENCOUNTER — Telehealth: Payer: Self-pay | Admitting: Pharmacist

## 2017-01-19 NOTE — Telephone Encounter (Signed)
Received a fax from [facility] regarding possible drug interaction (citalopram and eletriptan - may increase the risk of serotonin syndrome).  I was asked by Eliezer Lofts, PA-C to advised patient of above risk.    I reviewed patient's chart and noted that she is on multiple medications that may increase risk of serotonin syndrome (citalopram, eletriptan, tramadol).   I called patient to discuss.  Patient confirms she takes citalopram 20 mg daily, eletriptan as needed (up to 12 times per month), and tramadol.  I counseled patient on the risk of serotonin syndrome with these medications.  Patient confirms she is not having any adverse effects at this time.  I counseled her on the signs and symptoms of serotonin syndrome and advised her to seek medical care if these occur.  I counseled patient to only take tramadol as needed.  Patient voiced understanding and denies any further questions or concerns.    Elisabeth Most, Pharm.D., BCPS, CPP Clinical Pharmacist Pager: [phone] Phone: [phone] 01/19/2017 3:19 PM

## 2017-01-23 ENCOUNTER — Ambulatory Visit: Payer: BLUE CROSS/BLUE SHIELD | Admitting: Neurology

## 2017-01-23 ENCOUNTER — Telehealth: Payer: Self-pay | Admitting: Pharmacist

## 2017-01-23 NOTE — Telephone Encounter (Signed)
Received a prior authorization request for patient's tramadol.  PA was submitted.  I informed patient and advised we will update her once we receive the outcome of the PA.   Elisabeth Most, Pharm.D., BCPS, CPP Clinical Pharmacist Pager: [phone] Phone: [phone] 01/23/2017 4:11 PM

## 2017-01-24 NOTE — Telephone Encounter (Signed)
I received a fax from [facility] stating a prior authorization is not required.    I reached out to the pharmacy to determine if they were able to fill the prescription.  I spoke to [facility] who reported that they filled and sold a 7 day supply of tramadol to the patient on 01/20/17 and they cannot process the remainder of the prescription right now as it is too soon.    I called [facility] and spoke to [facility] regarding patient's prescription/prior authorization.  She reports that patient was only able to get a 7 day supply filled initially but the pharmacy should be able to process the full quantity of the tramadol prescription on 01/27/17.  She reports no prior authorization is required.    I informed patient of this information.  I advised her to call us if she has any further difficulty filling her prescription.  Patient voiced understanding.   Elisabeth Most, Pharm.D., BCPS, CPP Clinical Pharmacist Pager: [phone] Phone: [phone] 01/24/2017 10:03 AM

## 2017-01-25 ENCOUNTER — Encounter: Payer: Self-pay | Admitting: Family Medicine

## 2017-01-25 ENCOUNTER — Ambulatory Visit (INDEPENDENT_AMBULATORY_CARE_PROVIDER_SITE_OTHER): Payer: BLUE CROSS/BLUE SHIELD | Admitting: Family Medicine

## 2017-01-25 VITALS — BP 126/78 | HR 97 | Temp 98.6°F | Ht 67.0 in | Wt 202.0 lb

## 2017-01-25 DIAGNOSIS — I10 Essential (primary) hypertension: Secondary | ICD-10-CM | POA: Diagnosis not present

## 2017-01-25 DIAGNOSIS — F0781 Postconcussional syndrome: Secondary | ICD-10-CM | POA: Diagnosis not present

## 2017-01-25 DIAGNOSIS — Z79899 Other long term (current) drug therapy: Secondary | ICD-10-CM | POA: Diagnosis not present

## 2017-01-25 DIAGNOSIS — G43709 Chronic migraine without aura, not intractable, without status migrainosus: Secondary | ICD-10-CM

## 2017-01-25 DIAGNOSIS — IMO0002 Reserved for concepts with insufficient information to code with codable children: Secondary | ICD-10-CM

## 2017-01-25 DIAGNOSIS — M1612 Unilateral primary osteoarthritis, left hip: Secondary | ICD-10-CM

## 2017-01-25 DIAGNOSIS — E669 Obesity, unspecified: Secondary | ICD-10-CM | POA: Diagnosis not present

## 2017-01-25 DIAGNOSIS — M797 Fibromyalgia: Secondary | ICD-10-CM | POA: Diagnosis not present

## 2017-01-25 DIAGNOSIS — E038 Other specified hypothyroidism: Secondary | ICD-10-CM | POA: Diagnosis not present

## 2017-01-25 MED ORDER — AMLODIPINE BESYLATE 2.5 MG PO TABS: 2.5000 mg | ORAL_TABLET | Freq: Every day | ORAL | 3 refills | Status: DC

## 2017-01-25 NOTE — Patient Instructions (Signed)
Your blood pressure is under good control  Continue your Amlodipine 2.5 mg daily.  We are checking your thyroid function today.  If you need to change your dose of levothyroxine (Synthroid) then Dr Manha Amato will call you, otherwise he will send you a letter with your normal results.   We are also checking your blood sugar, kidney function and electrolytes to  Dr Jyden Kromer will call you if your tests are not good. Otherwise he will send you a letter.  If you sign up for MyChart online, you will be able to see your test results once Dr Sedalia Greeson has reviewed them.  If you do not hear from [name] with in 2 weeks please call our office

## 2017-01-26 LAB — BASIC METABOLIC PANEL
BUN/Creatinine Ratio: 20 (ref 12–28)
BUN: 18 mg/dL (ref 8–27)
CALCIUM: 9.9 mg/dL (ref 8.7–10.3)
CO2: 22 mmol/L (ref 18–29)
CREATININE: 0.89 mg/dL (ref 0.57–1.00)
Chloride: 107 mmol/L — ABNORMAL HIGH (ref 96–106)
GFR, EST AFRICAN AMERICAN: 81 mL/min/{1.73_m2} (ref >59)
GFR, EST NON AFRICAN AMERICAN: 70 mL/min/{1.73_m2} (ref >59)
Glucose: 73 mg/dL (ref 65–99)
Potassium: 4.3 mmol/L (ref 3.5–5.2)
Sodium: 142 mmol/L (ref 134–144)

## 2017-01-26 LAB — TSH: TSH: 4 u[IU]/mL (ref 0.450–4.500)

## 2017-01-29 ENCOUNTER — Encounter: Payer: Self-pay | Admitting: Family Medicine

## 2017-01-29 DIAGNOSIS — E66811 Obesity, class 1: Secondary | ICD-10-CM

## 2017-01-29 DIAGNOSIS — E669 Obesity, unspecified: Secondary | ICD-10-CM

## 2017-01-29 HISTORY — DX: Obesity, unspecified: E66.9

## 2017-01-29 HISTORY — DX: Obesity, class 1: E66.811

## 2017-01-29 NOTE — Assessment & Plan Note (Signed)
Lab Results  Component Value Date   TSH 4.000 01/25/2017  Adequate control.  Continue LT4 125 mcg at bedtime.

## 2017-01-29 NOTE — Assessment & Plan Note (Signed)
Established problem Uncontrollled Dr Jaynee Eagles started titration up of topamax for prophylaxis.  Pt found no benefit from trial of Namemda.

## 2017-01-29 NOTE — Assessment & Plan Note (Signed)
Adequate blood pressure control.  No evidence of new end organ damage.  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  Tolerating medication without significant adverse effects.  Plan to continue current blood pressure regiment w/ amlodipine.  We will take over prescribing for amlodipine from her cardiologist.

## 2017-01-29 NOTE — Assessment & Plan Note (Signed)
Established problem Uncontrollled Pain felt secondary to greater trochanteric bursistis being treated withdexamethasone ionophoresis with PT.

## 2017-01-29 NOTE — Progress Notes (Signed)
Subjective:    Patient ID: Molly Giles, female    DOB: 1956-01-01, 61 y.o.   MRN: [id number] Molly Giles is alone Sources of clinical information for visit is/are patient and past medical records. Nursing assessment for this office visit was reviewed with the patient for accuracy and revision.   HPI Problem List Items Addressed This Visit      Medium   Essential hypertension (Chronic) CHRONIC HYPERTENSION  Disease Monitoring  Blood pressure range: not checking at home  Chest pain: no   Dyspnea: no   Claudication: no   Medication compliance: yes,    Medication Side Effects  Lightheadedness: no   Urinary frequency: no   Edema: no    Preventitive Healthcare:  Exercise: yes    Salt Restriction: no     Relevant Medications   amLODipine (NORVASC) 2.5 MG tablet   Hypothyroidism - Primary (Chronic) Hypothyroidism Longstanding issue for patient Patient presents for evaluation of thyroid function.  Symptoms consist of none currently.  The problem has been unchanged.   Previous thyroid studies include TSH.  The hypothyroidism is due to autoimmune/inflammatory.   Relevant Orders   TSH (Completed)   Chronic migraine (Chronic) - Longstanding problem - Managed byDr Jaynee Eagles (Neuro) - Recent start on Topamax up titration with goal of 100 mg BID - No rash   Relevant Medications   amLODipine (NORVASC) 2.5 MG tablet   Post concussive syndrome - much improved. - Balance improved. - sense of hearing loss has declined.     Low   Fibromyalgia syndrome (Chronic) - longstanding problem - Mangmed byDr Devashwar (Rheum) - Ambien for primary insomnia.  Attempts at not using ambien or reducing dose resulted innot sleeping withsubsequent worsening of fibromyalgia symptoms.  Dr Patrecia Pour prescribes the Ambien. Pt on Citalopram. Ibuprfen, Robaxin, and tramadol.   - No report of tremors, sweats, confusion, muscle/jaw tightness. - Pt aware of risks of falss, confusion, MVAs with use of  Ambien, but feels rsiks are less than thebenefits to her.         Unprioritized   Osteoarthritis of left hip - Recurrent problem since hip repoalcement. - Has had temporaryimprovement after Grt Troch steroid injection byDr Devashwar recently.  - Currenly getting Dexamethosone ionophoresis over trochanter with Physical therapy.     Other Visit Diagnoses    High risk medication use       Relevant Orders   Basic Metabolic Panel (Completed)     No smoking   Review of Systems See HPI    Objective:   Physical Exam VS reviewed GEN: Alert, Cooperative, Groomed, NAD HEENT: PERRL; EAC bilaterally not occluded, TM's translucent with normal LM, (+) LR;                No cervical LAN, No thyromegaly, No palpable masses COR: RRR, 2/6 SEM LUSB No JVD, Normal PMI size and location LUNGS: BCTA, No Acc mm use, speaking in full sentences ABDOMEN: (+)BS, soft, NT, ND, No HSM, No palpable masses GU: Normal Rectal tone, no palpable masses, prostate without hypertrophy/asymmetry/nodularity. Hemoccult negative. EXT: No peripheral leg edema. Feet without deformity or lesions. Palpable bilateral pedal pulses. MSK: no tenderness over left Grt troch.  No pain with hips int/ext rotation  Neuro: Oriented to person, place, and time; Strength: 5/5 Bil. UE and LE symmetric; Sensation: Intact grossly to touch all four extremities; Cerebellar: Finger-to-Nose intact, Rhomberg negative Gait: Normal speed, No significant path deviation, Step through +,  Psych: Normal affect/thought/speech/language  Assessment & Plan:

## 2017-01-29 NOTE — Assessment & Plan Note (Addendum)
Stable. No hisotry or signs of Serotoinin-rleated symptoms Pt aware of risks from ambien, esp at current dose, including MVAs

## 2017-01-29 NOTE — Assessment & Plan Note (Signed)
Established problem that has improved.  Balance, mood, cognition, hearing improved.

## 2017-01-31 ENCOUNTER — Other Ambulatory Visit: Payer: Self-pay | Admitting: Family Medicine

## 2017-01-31 DIAGNOSIS — M797 Fibromyalgia: Secondary | ICD-10-CM

## 2017-01-31 NOTE — Telephone Encounter (Signed)
Pt states trazadone was supposed to be called in to [facility] on [address] last week. Pt only has 1 ambien left and wants to get the new Rx filled today. ep

## 2017-02-01 NOTE — Telephone Encounter (Signed)
2nd request. ep °

## 2017-02-06 MED ORDER — TRAZODONE HCL 50 MG PO TABS: 50.0000 mg | ORAL_TABLET | Freq: Every day | ORAL | 1 refills | Status: DC

## 2017-02-09 HISTORY — PX: EPIDURAL BLOCK INJECTION: SHX1516

## 2017-02-19 ENCOUNTER — Encounter: Payer: Self-pay | Admitting: Family Medicine

## 2017-03-01 ENCOUNTER — Encounter (INDEPENDENT_AMBULATORY_CARE_PROVIDER_SITE_OTHER): Payer: BLUE CROSS/BLUE SHIELD | Admitting: Family Medicine

## 2017-03-06 ENCOUNTER — Ambulatory Visit (INDEPENDENT_AMBULATORY_CARE_PROVIDER_SITE_OTHER): Payer: BLUE CROSS/BLUE SHIELD | Admitting: Neurology

## 2017-03-06 ENCOUNTER — Encounter: Payer: Self-pay | Admitting: Neurology

## 2017-03-06 VITALS — BP 139/91 | HR 85 | Ht 67.0 in | Wt 206.0 lb

## 2017-03-06 DIAGNOSIS — F513 Sleepwalking [somnambulism]: Secondary | ICD-10-CM

## 2017-03-06 DIAGNOSIS — G4734 Idiopathic sleep related nonobstructive alveolar hypoventilation: Secondary | ICD-10-CM | POA: Diagnosis not present

## 2017-03-06 DIAGNOSIS — R0689 Other abnormalities of breathing: Secondary | ICD-10-CM

## 2017-03-06 DIAGNOSIS — G44019 Episodic cluster headache, not intractable: Secondary | ICD-10-CM

## 2017-03-06 DIAGNOSIS — R0683 Snoring: Secondary | ICD-10-CM

## 2017-03-06 HISTORY — DX: Sleepwalking (somnambulism): F51.3

## 2017-03-06 HISTORY — DX: Episodic cluster headache, not intractable: G44.019

## 2017-03-06 HISTORY — DX: Snoring: R06.83

## 2017-03-06 HISTORY — DX: Idiopathic sleep related nonobstructive alveolar hypoventilation: G47.34

## 2017-03-06 HISTORY — DX: Morbid (severe) obesity due to excess calories: E66.01

## 2017-03-06 NOTE — Patient Instructions (Signed)
I have ordered a parasomnia montage sleep study for you, meaning that I want to try and capture dream activity and sleep walking is present. I also would like the sleep study to include CO2 measures and a special attention to apnea related hypoxemia. Episodic cluster headaches can be a sign of both or one of these 2 conditions.  Cluster Headache A cluster headache is a type of headache that causes deep, intense head pain. Cluster headaches can last from 15 minutes to 3 hours. They usually occur:  On one side of the head. They may occur on the other side when a new cluster of headaches begins.  Repeatedly over weeks to months.  Several times a day.  At the same time of day, often at night.  More often in the fall and springtime.  What are the causes? The cause of this condition is not known. What increases the risk? This condition is more likely to develop in:  Males.  People who drink alcohol.  People who smoke or use products that contain nicotine or tobacco.  People who take medicines that cause blood vessels to expand, such as nitroglycerin.  People who take antihistamines.  What are the signs or symptoms? Symptoms of this condition include:  Severe pain on one side of the head that begins behind or around your eye or temple.  Pain on one side of the head.  Nausea.  Sensitivity to light.  Runny nose and nasal stuffiness.  Sweaty, pale skin on the face.  Droopy or swollen eyelid, eye redness, or tearing.  Restlessness and agitation.  How is this diagnosed? This condition may be diagnosed based on:  Your symptoms.  A physical exam.  Your health care provider may order tests to see if your headaches are caused by another medical condition. These tests may show that you do not have cluster headaches. Tests may include:  A CT scan of your head.  An MRI of your head.  Lab tests.  How is this treated? This condition may be treated with:  Medicines to  relieve pain and to prevent repeated (recurrent) attacks. Some people may need a combination of medicines.  Oxygen. This helps to relieve pain.  Follow these instructions at home: Headache diary Keep a headache diary as told by your health care provider. Doing this can help you and your health care provider figure out what triggers your headaches. In your headache diary, include information about:  The time of day that your headache started and what you were doing when it began.  How long your headache lasted.  Where your pain started and whether it moved to other areas.  The type of pain, such as burning, stabbing, throbbing, or cramping.  Your level of pain. Use a pain scale and rate the pain with a number from 1 (mild) up to 10 (severe).  The treatment that you used, and any change in symptoms after treatment.  Medicines  Take over-the-counter and prescription medicines only as told by your health care provider.  Do not drive or use heavy machinery while taking prescription pain medicine.  Use oxygen as told by your health care provider. Lifestyle  Follow a regular sleep schedule. Do not vary the time that you go to bed or the amount that you sleep from day to day. It is important to stay on the same schedule during a cluster period to help prevent headaches.  Exercise regularly.  Eat a healthy diet and avoid foods that may trigger  your headaches.  Avoid alcohol.  Do not use any products that contain nicotine or tobacco, such as cigarettes and e-cigarettes. If you need help quitting, ask your health care provider. Contact a health care provider if:  Your headaches change, become more severe, or occur more often.  The medicine or oxygen that your health care provider recommended does not help. Get help right away if:  You faint.  You have weakness or numbness, especially on one side of your body or face.  You have double vision.  You have nausea or vomiting that  does not go away within several hours.  You have trouble talking, walking, or keeping your balance.  You have pain or stiffness in your neck.  You have a fever. Summary  A cluster headache is a type of headache that causes deep, intense head pain, usually on one side of the head.  Keep a headache diary to help discover what triggers your headaches.  A regular sleep schedule can help prevent headaches. This information is not intended to replace advice given to you by your health care provider. Make sure you discuss any questions you have with your health care provider. Document Released: 08/28/2005 Document Revised: 05/09/2016 Document Reviewed: 05/09/2016 Elsevier Interactive Patient Education  2018 [employer].  Cluster headaches can be treated with oxygen supplement at night.  Many insurances are not willing to permit oxygen supplement  unless we describe and document the lack of oxygen or a high level of CO2 in your blood at night.
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
## 2017-03-16 ENCOUNTER — Ambulatory Visit (INDEPENDENT_AMBULATORY_CARE_PROVIDER_SITE_OTHER): Payer: BLUE CROSS/BLUE SHIELD | Admitting: Neurology

## 2017-03-16 DIAGNOSIS — R0683 Snoring: Secondary | ICD-10-CM

## 2017-03-16 DIAGNOSIS — R0689 Other abnormalities of breathing: Secondary | ICD-10-CM

## 2017-03-16 DIAGNOSIS — G4734 Idiopathic sleep related nonobstructive alveolar hypoventilation: Secondary | ICD-10-CM

## 2017-03-16 DIAGNOSIS — F513 Sleepwalking [somnambulism]: Secondary | ICD-10-CM

## 2017-03-16 DIAGNOSIS — G44019 Episodic cluster headache, not intractable: Secondary | ICD-10-CM

## 2017-03-20 ENCOUNTER — Telehealth: Payer: Self-pay | Admitting: Family Medicine

## 2017-03-20 NOTE — Telephone Encounter (Signed)
Will forward to MD to make aware. Jazmin Hartsell,CMA  

## 2017-03-20 NOTE — Telephone Encounter (Signed)
Pt called and wanted to inform Dr. McDiarmid that she thinks she is having a gall bladder attack. She is going to go to the ER so that they can do [name] or xray. jw

## 2017-03-22 ENCOUNTER — Ambulatory Visit (INDEPENDENT_AMBULATORY_CARE_PROVIDER_SITE_OTHER): Payer: BLUE CROSS/BLUE SHIELD | Admitting: Family Medicine

## 2017-03-22 ENCOUNTER — Encounter (INDEPENDENT_AMBULATORY_CARE_PROVIDER_SITE_OTHER): Payer: Self-pay | Admitting: Family Medicine

## 2017-03-22 VITALS — BP 100/70 | HR 77 | Temp 97.5°F | Ht 66.25 in | Wt 204.0 lb

## 2017-03-22 DIAGNOSIS — Z1389 Encounter for screening for other disorder: Secondary | ICD-10-CM

## 2017-03-22 DIAGNOSIS — R5383 Other fatigue: Secondary | ICD-10-CM

## 2017-03-22 DIAGNOSIS — E669 Obesity, unspecified: Secondary | ICD-10-CM | POA: Diagnosis not present

## 2017-03-22 DIAGNOSIS — E038 Other specified hypothyroidism: Secondary | ICD-10-CM | POA: Diagnosis not present

## 2017-03-22 DIAGNOSIS — E784 Other hyperlipidemia: Secondary | ICD-10-CM

## 2017-03-22 DIAGNOSIS — K588 Other irritable bowel syndrome: Secondary | ICD-10-CM | POA: Diagnosis not present

## 2017-03-22 DIAGNOSIS — Z1331 Encounter for screening for depression: Secondary | ICD-10-CM

## 2017-03-22 DIAGNOSIS — I10 Essential (primary) hypertension: Secondary | ICD-10-CM | POA: Diagnosis not present

## 2017-03-22 DIAGNOSIS — E7849 Other hyperlipidemia: Secondary | ICD-10-CM

## 2017-03-22 DIAGNOSIS — Z0289 Encounter for other administrative examinations: Secondary | ICD-10-CM

## 2017-03-22 DIAGNOSIS — R0602 Shortness of breath: Secondary | ICD-10-CM

## 2017-03-22 DIAGNOSIS — Z6833 Body mass index (BMI) 33.0-33.9, adult: Secondary | ICD-10-CM | POA: Diagnosis not present

## 2017-03-22 NOTE — Progress Notes (Signed)
Office: [phone]  /  Fax: [phone]   Dear Jaynee Eagles,   Thank you for referring Molly Giles to our clinic. The following note includes my evaluation and treatment recommendations.  HPI:   Chief Complaint: OBESITY  Molly Giles has been referred by Berta Minor B. Jaynee Eagles, MD for consultation regarding her obesity and obesity related comorbidities.  Molly Giles (MR# [id number]) is a 61 y.o. female who presents on 03/22/2017 for obesity evaluation and treatment. Current BMI is Body mass index is 32.68 kg/m.Marland Kitchen Molly Giles has struggled with obesity for years and has been unsuccessful in either losing weight or maintaining long term weight loss. Molly Giles attended our information session and states she is currently in the action stage of change and ready to dedicate time achieving and maintaining a healthier weight.  Molly Giles states her family eats meals together she thinks her family will eat healthier with  her she struggles with family and or coworkers weight loss sabotage her desired weight loss is 46 lbs she started gaining weight when she changed jobs and started anti depressants her heaviest weight ever was 226 lbs. she has significant food cravings issues  she snacks frequently in the evenings she skips meals frequently she frequently makes poor food choices she frequently eats larger portions than normal  she has binge eating behaviors she struggles with emotional eating    Fatigue Molly Giles feels her energy is lower than it should be. This has worsened with weight gain and has not worsened recently. [name] denies daytime somnolence and  admits to waking up still tired. Patient is at risk for obstructive sleep apnea. Patent has a history of symptoms of morning fatigue, morning headache and hypertension. Patient generally gets 8 hours of sleep per night, and states they generally have restful sleep. Snoring is present. Apneic episodes are present. Epworth Sleepiness Score is 1  Dyspnea on  exertion Molly Giles notes increasing shortness of breath with exercising and seems to be worsening over time with weight gain. She notes getting out of breath sooner with activity than she used to. This has not gotten worse recently. Molly Giles denies orthopnea.  Hypertension Molly Giles is a 61 y.o. female with hypertension and is currently on amlodipine.  Molly Giles denies chest pain or shortness of breath on exertion. She is working weight loss to help control her blood pressure with the goal of decreasing her risk of heart attack and stroke. Molly Giles blood pressure is currently controlled today.  Hyperlipidemia Molly Giles has hyperlipidemia and is currently on statin. She is attempting to improve her cholesterol levels with intensive lifestyle modification including a low saturated fat diet, exercise and weight loss. She denies any chest pain, claudication or myalgias.  Hypothyroid Molly Giles has a diagnosis of hypothyroidism. She is on levothyroxine. She admits heat/cold intolerance and denies palpitations, but does admit to ongoing fatigue.  Irritable Bowel Syndrome diarrhea predominant Molly Giles notes diarrhea has slightly improved with daily tramadol.   Depression Screen Molly Giles's Food and Mood (modified PHQ-9) score was  Depression screen PHQ 2/9 03/22/2017  Decreased Interest 2  Down, Depressed, Hopeless 1  PHQ - 2 Score 3  Altered sleeping 3  Tired, decreased energy 1  Change in appetite 2  Feeling bad or failure about yourself  3  Trouble concentrating 1  Moving slowly or fidgety/restless 1  Suicidal thoughts 0  PHQ-9 Score 14  Difficult doing work/chores -    ALLERGIES: Allergies  Allergen Reactions  . Sulfacetamide Sodium Itching  . Codeine Nausea Only  Unknown   . Hibiclens [Chlorhexidine Gluconate] Other (See Comments)    Burning  . Lyrica [Pregabalin] Other (See Comments)    Dizziness   . Sulfa Antibiotics Itching  . Sulfamethoxazole Itching  . Betadine [Povidone Iodine] Rash     burning  . Latex Rash and Itching    burning  . Wellbutrin [Bupropion] Anxiety    MEDICATIONS: Current Outpatient Prescriptions on File Prior to Visit  Medication Sig Dispense Refill  . amLODipine (NORVASC) 2.5 MG tablet Take 1 tablet (2.5 mg total) by mouth daily. NEEDS APPOINTMENT FOR FUTURE REFILLS OR 90-DAY SUPPLY 90 tablet 3  . Azelastine-Fluticasone (DYMISTA) 137-50 MCG/ACT SUSP Place 1 spray into the nose daily as needed.     . citalopram (CELEXA) 20 MG tablet Take 1 tablet (20 mg total) by mouth daily. 90 tablet 1  . eletriptan (RELPAX) 40 MG tablet Take 1 tablet (40 mg total) by mouth as needed for migraine. may repeat in 2 hours if necessary 10 tablet 0  . ibuprofen (ADVIL,MOTRIN) 200 MG tablet Take 800 mg by mouth.     . levothyroxine (SYNTHROID, LEVOTHROID) 125 MCG tablet Take  0.5 tablet by mouth daily (Patient taking differently: Take  0.5 tablet by mouth daily Takes at bedtime) 90 tablet 3  . loratadine (CLARITIN) 10 MG tablet Take 10 mg by mouth daily.    . methocarbamol (ROBAXIN) 500 MG tablet Take 1 tablet (500 mg total) by mouth every 6 (six) hours as needed for muscle spasms. 40 tablet 0  . nystatin-triamcinolone (MYCOLOG II) cream Apply 1 application topically 2 (two) times daily.     . prednisoLONE acetate (PRED FORTE) 1 % ophthalmic suspension Place 1 drop into both eyes daily.    . Probiotic Product ([place]) Take by mouth.    . rosuvastatin (CRESTOR) 10 MG tablet Take 1 tablet (10 mg total) by mouth daily. 90 tablet 3  . Topiramate ER (QUDEXY XR) 25 MG CS24 Topiramate ER: First week: 25mg (one pill) at night, Second week: 50mg (two pills) at night, Third week: 75mg  (3 pills) at night. Then increase to 100mg  (4 pills at night) 30 each 6  . traMADol (ULTRAM) 50 MG tablet Take 2 tablets (100 mg total) by mouth 2 (two) times daily. 120 tablet 0  . traZODone (DESYREL) 50 MG tablet Take 1 tablet (50 mg total) by mouth at bedtime. 90 tablet 1  .  zolpidem (AMBIEN) 10 MG tablet Take 1 tablet (10 mg total) by mouth at bedtime as needed. 90 tablet 1   No current facility-administered medications on file prior to visit.     PAST MEDICAL HISTORY: Past Medical History:  Diagnosis Date  . Allergic rhinoconjunctivitis 07/02/2015  . Arthritis   . Benign positional vertigo 04/2015   Responded well to [facility]ibular Rehab  . Bruxism (teeth grinding)   . Chronic migraine 02/25/2016   Per Dr Jaynee Eagles review in notes from [facility] from September 2015. Showed total headache days last month 18. Severe headache days 7 days. Moderate headache days 5 days. Mild headache days last month sick days. Days without headache last month 10 days. Symptoms associated with photophobia, phonophobia, osmophobia, neck pain, dizziness, jaw pain, nasal congestion, vision disturbances, tingling and numbness, weakness and worsening with activity. Each headache attack last 3 hours depending on treatment in severity. Left side, the right side, easier side, the frontal area in the back of the head. Characterized as throbbing, pressure, tightness, squeezing, stabbing and burning   . Chronic  migraine w/o aura w/o status migrainosus, not intractable 07/14/2015  . DDD (degenerative disc disease), lumbar   . Degenerative cervical disc   . Depression   . Dysrhythmia    seen by dr Wynonia Lawman- not a problem since she has been on Bystolic  . Essential hypertension 05/15/2013  . Family history of adverse reaction to anesthesia    Brother- N/V  . Family history of premature CAD 05/15/2013  . Fibromyalgia   . GERD (gastroesophageal reflux disease) 12/16/2014  . H/O seasonal allergies   . Hearing loss of both ears 07/27/2015   mild to borderline moderate low frequency hearing loss improving to within normal limits bilaterally on audiology testing at [facility] in November 2016.    Marland Kitchen History of Clostridium difficile colitis 07/01/2015   Required Fecal Transplantation  tocure  . History of irritable bowel syndrome 02/15/2016  . History of migraine headaches 05/15/2013   Per Dr Jaynee Eagles review in notes from [facility] from September 2015. Showed total headache days last month 18. Severe headache days 7 days. Moderate headache days 5 days. Mild headache days last month sick days. Days without headache last month 10 days. Symptoms associated with photophobia, phonophobia, osmophobia, neck pain, dizziness, jaw pain, nasal congestion, vision disturbances, tingling and numbness, weakness and worsening with activity. Each headache attack last 3 hours depending on treatment in severity. Left side, the right side, easier side, the frontal area in the back of the head. Characterized as throbbing, pressure, tightness, squeezing, stabbing and burning    . Hx of bad fall 02/2015   Severe Facial/head trauma without fracture  . Hyperlipidemia 1998  . Hypertension 2004  . Hypothyroidism   . Impairment of balance 02/2015   Consequent of postconcussive syndrome  . Interstitial cystitis   . 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 Migraine 1962   Chronic Migraines  . Mood disorder ([place])   . Musculoskeletal neck pain 07/14/2015  . Normal coronary arteries 05/14/2014  . Osteoarthritis of left hip 11/01/2015   MRI order by Dr Alvan Dame (ortho) 10/2015 showed significant arthritis of left hip joint with cystic changes in femoral head c/w osteoarthritis  . 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 Overweight   . Pain in joint of left shoulder 11/09/2016  . Palpitations 05/15/2013   [facility] manages  . Perirectal cyst 05/07/2016  . PONV (postoperative nausea and vomiting)   . Post concussion syndrome 06/06/2015  . Post concussive syndrome 07/14/2015   Ms Romano's post-concussive syndrome manifesting in vertigo and headache, mood changes, poor balance, dizziness, and decreased concentration per Dr Jaynee Eagles at [facility].    Marland Kitchen Posterior vitreous detachment of right eye 2014  . Shortness of breath dyspnea    with exertion  . Thyroid nodule 08/11/2009   Findings: The thyroid gland is within normal limits in size.  The gland is diffusely inhomogeneous. A small solid nodule is noted in the lower pole  medially on the right of 7 x 6 x 8 mm. A small solid nodule is noted inferiorly on the left of 3 x 3 x 4 mm.  IMPRESSION:  The thyroid gland is within normal limits in size with only small solid nodules present, the largest of only 8 mm in diameter on the right.      PAST SURGICAL HISTORY: Past Surgical History:  Procedure Laterality Date  . Bladder dilitation     x 3  . BREAST BIOPSY  2011  Benign histology  . CARDIOVASCULAR STRESS TEST  2000   Unremarkable per pt report  . CARPOMETACARPAL JOINT ARTHROTOMY Right 2011  . COLONOSCOPY    . COLONOSCOPY WITH PROPOFOL N/A 04/21/2015   Procedure: COLONOSCOPY WITH PROPOFOL;  Surgeon: Carol Ada, MD;  Location: [facility];  Service: Endoscopy;  Laterality: N/A;  . EPIDURAL BLOCK INJECTION Left 04/12/2016   Left Medial Nerve Block and Left L5 ramus block, Dr Suella Broad   . EPIDURAL BLOCK INJECTION  03/21/2016   Left L3-4 medial branch block and Left L5 & dorsal ramus block   . EPIDURAL BLOCK INJECTION N/A 10/25/2016   Suella Broad, MD. Lumbar medial branch block  . EPIDURAL BLOCK INJECTION N/A 02/09/2017   Suella Broad, MD.  Bilateral L3/4 medial branch block, bilateral L5 dorsal ramus block  . FECAL TRANSPLANT  04/21/2015   Procedure: FECAL TRANSPLANT;  Surgeon: Carol Ada, MD;  Location: [place];  Service: Endoscopy;;  . INJECTION HIP INTRA ARTICULAR Left 11/2015   for OA by Dr Suella Broad  . OTHER SURGICAL HISTORY Left 2016   Left L3/L4 medial nerve block and Left L5 Dorsal Ramus block Dr Mickel Duhamel  . TOTAL HIP ARTHROPLASTY Left 07/25/2016   Procedure: LEFT TOTAL HIP ARTHROPLASTY ANTERIOR APPROACH;  Surgeon: Paralee Cancel, MD;  Location: [facility];   Service: Orthopedics;  Laterality: Left;    SOCIAL HISTORY: Social History  Substance Use Topics  . Smoking status: Passive Smoke Exposure - Never Smoker  . Smokeless tobacco: Never Used  . Alcohol use No     Comment: No use in 30 years.    FAMILY HISTORY: Family History  Problem Relation Age of Onset  . Alzheimer's disease Mother   . Hyperlipidemia Mother   . Hypertension Mother   . Osteoporosis Mother   . Parkinson's disease Mother   . Migraines Sister   . Allergies Sister   . Hyperlipidemia Brother   . Cardiomyopathy Brother   . Diabetes type II Brother   . Kidney disease Brother   . Hypertension Brother   . Hyperlipidemia Brother   . Kidney disease Brother   . Heart disease Father   . Hyperlipidemia Father   . Hypertension Father   . Aortic aneurysm Father   . Early death Father 67  . Hypertension Sister   . Diabetes type II Unknown   . Breast cancer Maternal Aunt   . Asthma Brother   . Heart disease Brother     ROS: Review of Systems  Constitutional: Positive for malaise/fatigue.  HENT: Positive for congestion (nasal stuffiness) and hearing loss.        Dry mouth  Eyes: Positive for redness.       Wear Glasses or Contacts Floaters  Respiratory: Positive for shortness of breath (with activity).   Cardiovascular: Negative for chest pain, palpitations, orthopnea and claudication.  Gastrointestinal: Positive for diarrhea and nausea.  Musculoskeletal: Positive for back pain, joint pain (muscle or joint pain) and neck pain. Negative for myalgias.       Neck stiffness Muscle stiffness  Skin: Positive for itching.       dryness  Neurological: Positive for weakness and headaches.  Endo/Heme/Allergies:       Heat/Cold Intolerance  Psychiatric/Behavioral: The patient has insomnia.     PHYSICAL EXAM: Blood pressure 100/70, pulse 77, temperature (!) 97.5 F (36.4 C), temperature source Oral, height 5' 6.25" (1.683 m), weight 204 lb (92.5 kg), SpO2 98 %. Body  mass index is 32.68 kg/m. Physical Exam  Constitutional: She is oriented to person, place, and time. She appears well-developed and well-nourished.  Cardiovascular: Normal rate.   Pulmonary/Chest: Effort normal.  Musculoskeletal: Normal range of motion.  Neurological: She is oriented to person, place, and time.  Skin: Skin is warm and dry.  Psychiatric: She has a normal mood and affect. Her behavior is normal.  Vitals reviewed.   RECENT LABS AND TESTS: BMET    Component Value Date/Time   NA 142 01/25/2017 1109   K 4.3 01/25/2017 1109   CL 107 (H) 01/25/2017 1109   CO2 22 01/25/2017 1109   GLUCOSE 73 01/25/2017 1109   GLUCOSE 132 (H) 07/26/2016 0419   BUN 18 01/25/2017 1109   CREATININE 0.89 01/25/2017 1109   CREATININE 0.74 02/24/2016 1158   CALCIUM 9.9 01/25/2017 1109   GFRNONAA 70 01/25/2017 1109   GFRNONAA 88 02/24/2016 1158   GFRAA 81 01/25/2017 1109   GFRAA >89 02/24/2016 1158   No results found for: HGBA1C No results found for: INSULIN CBC    Component Value Date/Time   WBC 15.6 (H) 07/26/2016 0419   RBC 3.73 (L) 07/26/2016 0419   HGB 10.9 (L) 07/26/2016 0419   HCT 33.6 (L) 07/26/2016 0419   PLT 236 07/26/2016 0419   MCV 90.1 07/26/2016 0419   MCH 29.2 07/26/2016 0419   MCHC 32.4 07/26/2016 0419   RDW 14.6 07/26/2016 0419   Iron/TIBC/Ferritin/ %Sat No results found for: IRON, TIBC, FERRITIN, IRONPCTSAT Lipid Panel     Component Value Date/Time   CHOL 138 12/14/2014 1036   TRIG 130 12/14/2014 1036   HDL 35 (L) 12/14/2014 1036   CHOLHDL 3.9 12/14/2014 1036   VLDL 26 12/14/2014 1036   LDLCALC 77 12/14/2014 1036   Hepatic Function Panel     Component Value Date/Time   PROT 7.1 07/01/2015 1239   ALBUMIN 4.2 07/01/2015 1239   AST 24 07/01/2015 1239   ALT 28 07/01/2015 1239   ALKPHOS 77 07/01/2015 1239   BILITOT 0.4 07/01/2015 1239      Component Value Date/Time   TSH 4.000 01/25/2017 1109   TSH 3.70 02/24/2016 1158   TSH 1.327 07/01/2015 1239      ECG  shows NSR with a rate of 73 BPM INDIRECT CALORIMETER done today shows a VO2 of 225 and a REE of 1567.    ASSESSMENT AND PLAN: Other fatigue - Plan: EKG 12-Lead, Vitamin B12, CBC With Differential, Comprehensive metabolic panel, Folate, Hemoglobin A1c, Insulin, random, VITAMIN D 25 Hydroxy (Vit-D Deficiency, Fractures)  Shortness of breath on exertion  Other hyperlipidemia - Plan: Lipid Panel With LDL/HDL Ratio  Other specified hypothyroidism - Plan: T3, T4, free, TSH  Essential hypertension  Other irritable bowel syndrome  Depression screening  Class 1 obesity with serious comorbidity and body mass index (BMI) of 33.0 to 33.9 in adult, unspecified obesity type  PLAN: Fatigue Mystic was informed that her fatigue may be related to obesity, depression or many other causes. Labs will be ordered, and in the meanwhile Abisai has agreed to work on diet, exercise and weight loss to help with fatigue. Proper sleep hygiene was discussed including the need for 7-8 hours of quality sleep each night. A sleep study was not ordered based on symptoms and Epworth score.  Dyspnea on exertion Elanah's shortness of breath appears to be obesity related and exercise induced. She has agreed to work on weight loss and gradually increase exercise to treat her exercise induced shortness of breath. If Molly Giles follows our instructions  and loses weight without improvement of her shortness of breath, we will plan to refer to pulmonology. We will monitor this condition regularly. Avonlea agrees to this plan.  Hypertension We discussed sodium restriction, working on healthy weight loss, and a regular exercise program as the means to achieve improved blood pressure control. Molly Giles agreed with this plan and agreed to follow up as directed. We will check labs and continue to monitor her blood pressure as well as her progress with the above lifestyle modifications. She will continue her medications as prescribed and  will watch for signs of hypotension as she continues her lifestyle modifications.  Hyperlipidemia Darrielle was informed of the American Heart Association Guidelines emphasizing intensive lifestyle modifications as the first line treatment for hyperlipidemia. We discussed many lifestyle modifications today in depth, and Muskaan will continue to work on decreasing saturated fats such as fatty red meat, butter and many fried foods. She will also increase vegetables and lean protein in her diet and continue to work on exercise and weight loss efforts. We will check labs and Vivien agrees to follow up with our clinic in 2 weeks.  Hypothyroid Ahniyah was informed of the importance of good thyroid control to help with weight loss efforts. She was also informed that supertheraputic thyroid levels are dangerous and will not improve weight loss results. We will check labs and Teran agrees to follow up with our clinic in 2 weeks.  Irritable Bowel Syndrome diarrhea predominant Annalysia agrees to start low simple carbohydrate diet and increase H2O intake and follow up with our clinic in 2 weeks.  Depression Screen Kariss had a moderately positive depression screening. Depression is commonly associated with obesity and often results in emotional eating behaviors. We will monitor this closely and work on CBT to help improve the non-hunger eating patterns. Referral to Psychology may be required if no improvement is seen as she continues in our clinic.  Obesity Arieon is currently in the action stage of change and her goal is to continue with weight loss efforts. I recommend Foy begin the structured treatment plan as follows:  She has agreed to follow the Category 2 plan +100 calories Riyanna has been instructed to eventually work up to a goal of 150 minutes of combined cardio and strengthening exercise per week for weight loss and overall health benefits. We discussed the following Behavioral Modification Strategies today: no  skipping meals, meal planning & cooking strategies, increasing lean protein intake and decrease eating out  Deaundra has agreed to join our obesity program and follow up with our clinic in 2 weeks. She was informed of the importance of frequent follow up visits to maximize her success with intensive lifestyle modifications for her multiple health conditions. She was informed we would discuss her lab results at her next visit unless there is a critical issue that needs to be addressed sooner. Guyla agreed to keep her next visit at the agreed upon time to discuss these results.  I, Doreene Nest, am acting as transcriptionist for Dennard Nip, MD  I have reviewed the above documentation for accuracy and completeness, and I agree with the above. -Dennard Nip, MD  OBESITY BEHAVIORAL INTERVENTION VISIT  Today's visit was # 1 out of 29.  Starting weight: 204 lbs Starting date: 03/22/17 Today's weight : 204 lbs Today's date: 03/22/2017 Total lbs lost to date: 0 (Patients must lose 7 lbs in the first 6 months to continue with counseling)   ASK: We discussed the diagnosis of obesity with  Gordy Savers today and Shaneisha agreed to give [name] permission to discuss obesity behavioral modification therapy today.  ASSESS: Kamylle has the diagnosis of obesity and her BMI today is 32.7 Gennesis is in the action stage of change   ADVISE: Mildred was educated on the multiple health risks of obesity as well as the benefit of weight loss to improve her health. She was advised of the need for long term treatment and the importance of lifestyle modifications.  AGREE: Multiple dietary modification options and treatment options were discussed and  Jesi agreed to follow the Category 2 plan +100 calories We discussed the following Behavioral Modification Strategies today: no skipping meals, meal planning & cooking strategies, increasing lean protein intake and decrease eating out

## 2017-03-23 LAB — COMPREHENSIVE METABOLIC PANEL
A/G RATIO: 2.1 (ref 1.2–2.2)
ALT: 46 IU/L — AB (ref 0–32)
AST: 33 IU/L (ref 0–40)
Albumin: 4.4 g/dL (ref 3.6–4.8)
Alkaline Phosphatase: 90 IU/L (ref 39–117)
BUN/Creatinine Ratio: 9 — ABNORMAL LOW (ref 12–28)
BUN: 7 mg/dL — ABNORMAL LOW (ref 8–27)
Bilirubin Total: 0.3 mg/dL (ref 0.0–1.2)
CALCIUM: 9.4 mg/dL (ref 8.7–10.3)
CO2: 25 mmol/L (ref 20–29)
CREATININE: 0.79 mg/dL (ref 0.57–1.00)
Chloride: 103 mmol/L (ref 96–106)
GFR calc Af Amer: 93 mL/min/{1.73_m2} (ref >59)
GFR, EST NON AFRICAN AMERICAN: 81 mL/min/{1.73_m2} (ref >59)
Globulin, Total: 2.1 g/dL (ref 1.5–4.5)
Glucose: 76 mg/dL (ref 65–99)
POTASSIUM: 4.8 mmol/L (ref 3.5–5.2)
Sodium: 142 mmol/L (ref 134–144)
TOTAL PROTEIN: 6.5 g/dL (ref 6.0–8.5)

## 2017-03-23 LAB — CBC WITH DIFFERENTIAL
Basophils Absolute: 0 10*3/uL (ref 0.0–0.2)
Basos: 1 %
EOS (ABSOLUTE): 0.1 10*3/uL (ref 0.0–0.4)
EOS: 2 %
HEMATOCRIT: 39.6 % (ref 34.0–46.6)
Hemoglobin: 12.9 g/dL (ref 11.1–15.9)
IMMATURE GRANS (ABS): 0 10*3/uL (ref 0.0–0.1)
Immature Granulocytes: 0 %
LYMPHS: 31 %
Lymphocytes Absolute: 2.1 10*3/uL (ref 0.7–3.1)
MCH: 29.3 pg (ref 26.6–33.0)
MCHC: 32.6 g/dL (ref 31.5–35.7)
MCV: 90 fL (ref 79–97)
Monocytes Absolute: 0.7 10*3/uL (ref 0.1–0.9)
Monocytes: 10 %
NEUTROS PCT: 56 %
Neutrophils Absolute: 3.7 10*3/uL (ref 1.4–7.0)
RBC: 4.4 x10E6/uL (ref 3.77–5.28)
RDW: 14.3 % (ref 12.3–15.4)
WBC: 6.6 10*3/uL (ref 3.4–10.8)

## 2017-03-23 LAB — TSH: TSH: 3.19 u[IU]/mL (ref 0.450–4.500)

## 2017-03-23 LAB — LIPID PANEL WITH LDL/HDL RATIO
Cholesterol, Total: 152 mg/dL (ref 100–199)
HDL: 53 mg/dL (ref >39)
LDL Calculated: 75 mg/dL (ref 0–99)
LDL/HDL RATIO: 1.4 ratio (ref 0.0–3.2)
TRIGLYCERIDES: 120 mg/dL (ref 0–149)
VLDL Cholesterol Cal: 24 mg/dL (ref 5–40)

## 2017-03-23 LAB — T3: T3, Total: 100 ng/dL (ref 71–180)

## 2017-03-23 LAB — FOLATE: Folate: 15.5 ng/mL (ref >3.0)

## 2017-03-23 LAB — T4, FREE: Free T4: 1.2 ng/dL (ref 0.82–1.77)

## 2017-03-23 LAB — HEMOGLOBIN A1C
ESTIMATED AVERAGE GLUCOSE: 105 mg/dL
HEMOGLOBIN A1C: 5.3 % (ref 4.8–5.6)

## 2017-03-23 LAB — VITAMIN D 25 HYDROXY (VIT D DEFICIENCY, FRACTURES): VIT D 25 HYDROXY: 36.6 ng/mL (ref 30.0–100.0)

## 2017-03-23 LAB — INSULIN, RANDOM: INSULIN: 10.5 u[IU]/mL (ref 2.6–24.9)

## 2017-03-23 LAB — VITAMIN B12: Vitamin B-12: 448 pg/mL (ref 232–1245)

## 2017-03-26 ENCOUNTER — Encounter: Payer: Self-pay | Admitting: Neurology

## 2017-03-26 DIAGNOSIS — R51 Headache: Secondary | ICD-10-CM

## 2017-03-26 DIAGNOSIS — R519 Headache, unspecified: Secondary | ICD-10-CM

## 2017-03-26 DIAGNOSIS — G44001 Cluster headache syndrome, unspecified, intractable: Secondary | ICD-10-CM

## 2017-03-26 DIAGNOSIS — G4701 Insomnia due to medical condition: Secondary | ICD-10-CM

## 2017-03-26 NOTE — Procedures (Signed)
PATIENT'S NAME:  Shenia, Alan DOB:      [birth year]      MR#:    [id number]     DATE OF RECORDING: 03/16/2017 REFERRING M.D.:  Sarina Ill, MD , Sherren Mocha McDiarmid MD Study Performed:   Baseline Polysomnogram HISTORY:  patient with nocturnal cluster headaches, sharp ice-pick like pains that wake her from sleep.  Hypoxemia needs to be ruled out, slow wave sleep disorder is in the differential.   Arthritis, chronic Migraines, DDD, dysrhythmia , hypothyroidism, GERD, IBS, Hypertension, Osteoarthritis, Post concussive syndrome.  The patient endorsed the Epworth Sleepiness Scale at 2 points.  The patient's weight 206 pounds with a height of 67 (inches), resulting in a BMI of 32.2 kg/m2.The patient's neck circumference measured 15 inches.  CURRENT MEDICATIONS: Norvasc, Dymista, Celexa, Relpax, Advil, Synthroid, Claritin, Robaxin, Crestor, Ultram, Desyrel, Ambien   PROCEDURE:  This is a multichannel digital polysomnogram utilizing the SomnoStar 11.2 system.  Electrodes and sensors were applied and monitored per AASM Specifications.   EEG, EOG, Chin and Limb EMG, were sampled at 200 Hz.  ECG, Snore and Nasal Pressure, Thermal Airflow, Respiratory Effort, CPAP Flow and Pressure, Oximetry was sampled at 50 Hz. Digital video and audio were recorded.      BASELINE STUDY: Lights Out was at 21:15 and Lights On at 03:54.  Total recording time (TRT) was 399 minutes, with a total sleep time (TST) of 234.5 minutes.   The patient's sleep latency was 53 minutes.  REM latency was 0 minutes.  The sleep efficiency was 58.8 %.     SLEEP ARCHITECTURE: WASO (Wake after sleep onset) was 28 minutes.  There were 31 minutes in Stage N1, 114 minutes Stage N2, 89.5 minutes Stage N3 and 0 minutes in Stage REM.  The percentage of Stage N1 was 13.2%, Stage N2 was 48.6%, Stage N3 was 38.2% and Stage R (REM sleep) was 0%.   RESPIRATORY ANALYSIS:  There were a total of 11 respiratory events:  1 obstructive apneas, 0 central apneas and 0  mixed apneas with a total of 1 apnea and an apnea index (AI) of 0.3 /hour. There were 10 hypopneas with a hypopnea index of 2.6 /hour. The patient also had 20 respiratory event related arousals (RERAs). The total APNEA/HYPOPNEA INDEX (AHI) was 2.8/hour and the total RESPIRATORY DISTURBANCE INDEX was 7.9 /hour.  0 events occurred in REM sleep and 20 events in NREM. The REM AHI was 0 /hour, versus a non-REM AHI of 2.8. The patient spent 126.5 minutes of total sleep time in the supine position and 108 minutes in non-supine. The supine AHI was 4.8 versus a non-supine AHI of 0.6.  OXYGEN SATURATION & C02:  The Wake baseline 02 saturation was 98%, with the lowest being 86%. Time spent below 89% saturation equaled 3 minutes. Total sleep time with Co2 greater than 40 torr was 0.00 minutes. PERIODIC LIMB MOVEMENTS:  The patient had a total of 144 Periodic Limb Movements.  The Periodic Limb Movement (PLM) index was 36.8 and the PLM Arousal index was 13.8 /hour. The arousals were noted as: 20 were spontaneous, 54 were associated with PLMs, and 29 were associated with respiratory events. EKG was in keeping with normal sinus rhythm (NSR).  Audio and video analysis did not show any abnormal or unusual movements, behaviors, phonations or vocalizations.    The patient took one bathroom break after which she was unable to sleep.  IMPRESSION:  1. No evidence of clinically significant Obstructive Sleep Apnea (OSA), hypercapnia or hypoxemia  during sleep. No explanation for headaches was found.  2.  Frequent PLMs, Periodic Limb Movement Disorder (PLMD), leading to many arousals. Since there was no REM sleep observed, there was no correlation to REM BD established.  RECOMMENDATIONS:   1. A cause for the patient's headaches was not found.  2. The patient reported back pain and abdominal pain as causes for poor sleep and restlessness.  3. Avoid caffeine-containing beverages and chocolate to improve PLMs and insomnia.  Correlate clinically for a history consistent with regarding restless legs syndrome (RLS). Consider dedicated sleep psychology referral if insomnia is of clinical concern.   4. A follow up appointment can be scheduled in the Sleep Clinic at [facility]. The referring provider will be notified of the results.      I certify that I have reviewed the entire raw data recording prior to the issuance of this report in accordance with the Standards of Accreditation of the American Academy of Sleep Medicine (AASM)      Larey Seat, MD      03-26-2017  Diplomat, American Board of Psychiatry and Neurology  Diplomat, American Board of [place] Director, [employer] [facility] [employer]

## 2017-03-27 ENCOUNTER — Telehealth: Payer: Self-pay | Admitting: Neurology

## 2017-03-27 NOTE — Telephone Encounter (Signed)
-----   Message from Larey Seat, MD sent at 03/26/2017  5:08 PM EDT ----- The patient took one bathroom break after which she was unable to  sleep.  IMPRESSION:  1. No evidence of clinically significant Obstructive Sleep Apnea  (OSA), hypercapnia or hypoxemia during sleep. No explanation for  headaches was found.  2. Frequent PLMs, Periodic Limb Movement Disorder (PLMD),  leading to many arousals. Since there was no REM sleep observed,  there was no correlation to REM BD established.  RECOMMENDATIONS:   1. A cause for the patient's headaches was not found.  2. The patient reported back pain and abdominal pain as causes  for poor sleep and restlessness.  3. Avoid caffeine-containing beverages and chocolate to improve  PLMs and insomnia. Correlate clinically for a history consistent  with regarding restless legs syndrome (RLS).  Consider dedicated  sleep psychology referral if insomnia is of clinical concern.

## 2017-03-27 NOTE — Telephone Encounter (Signed)
Called pt to discuss sleep study results. Explained to the pt that she didn't have any evidence of sleep apnea or low oxygenation. Dr Gilles Chiquito was unable to find a true reason for the headaches. Pt was concerned about this but was thankful that there was no apnea diagnosis. I did inform her that Dr Gilles Chiquito stated that if insomnia was of clinical concern the pt could have a sleep psych referral placed. Pt declined at this time. Pt states she will continue to follow up with Dr Jaynee Eagles. She verbalized understanding and had no further questions.

## 2017-03-28 ENCOUNTER — Encounter: Payer: Self-pay | Admitting: Family Medicine

## 2017-03-28 DIAGNOSIS — G4761 Periodic limb movement disorder: Secondary | ICD-10-CM

## 2017-03-28 HISTORY — DX: Periodic limb movement disorder: G47.61

## 2017-04-08 ENCOUNTER — Other Ambulatory Visit: Payer: Self-pay | Admitting: Family Medicine

## 2017-04-08 DIAGNOSIS — E038 Other specified hypothyroidism: Secondary | ICD-10-CM

## 2017-04-09 ENCOUNTER — Ambulatory Visit (INDEPENDENT_AMBULATORY_CARE_PROVIDER_SITE_OTHER): Payer: BLUE CROSS/BLUE SHIELD | Admitting: Family Medicine

## 2017-04-09 VITALS — BP 117/85 | HR 80 | Temp 97.6°F | Ht 66.0 in | Wt 195.0 lb

## 2017-04-09 DIAGNOSIS — E559 Vitamin D deficiency, unspecified: Secondary | ICD-10-CM

## 2017-04-09 DIAGNOSIS — E669 Obesity, unspecified: Secondary | ICD-10-CM | POA: Diagnosis not present

## 2017-04-09 DIAGNOSIS — Z9189 Other specified personal risk factors, not elsewhere classified: Secondary | ICD-10-CM

## 2017-04-09 DIAGNOSIS — E8881 Metabolic syndrome: Secondary | ICD-10-CM | POA: Diagnosis not present

## 2017-04-09 DIAGNOSIS — Z6831 Body mass index (BMI) 31.0-31.9, adult: Secondary | ICD-10-CM

## 2017-04-09 MED ORDER — VITAMIN D (ERGOCALCIFEROL) 1.25 MG (50000 UNIT) PO CAPS: 50000.0000 [IU] | ORAL_CAPSULE | ORAL | 0 refills | Status: DC

## 2017-04-09 NOTE — Progress Notes (Signed)
Office: [phone]  /  Fax: [phone]   HPI:   Chief Complaint: OBESITY Molly Giles is here to discuss her progress with her obesity treatment plan. She is on the  follow the Category 2 plan +100 calories and is following her eating plan approximately 100 % of the time. She states she is exercising 0 minutes 0 times per week. Molly Giles did well with weight loss. Hunger was mostly controlled but she noted pm cravings however she made good snacking choices. She had some social eating situations but made good choices. Her weight is 195 lb (88.5 kg) today and has had a weight loss of 9 pounds over a period of 2 weeks since her last visit. She has lost 9 lbs since starting treatment with [name].  Vitamin D deficiency Molly Giles has a diagnosis of vitamin D deficiency. She is taking OTC vit D occasionally, level is not at goal. She admits fatigue and denies nausea, vomiting or muscle weakness.  Insulin Resistance Molly Giles has a diagnosis of insulin resistance based on her elevated fasting insulin level >5. Although Molly Giles's blood glucose readings and A1c are normal, insulin resistance puts her at greater risk of metabolic syndrome and diabetes. She is not taking metformin currently and continues to work on diet and exercise to decrease risk of diabetes. She admits polyphagia and that she gets hungry within 2 to 3 hours after eating.  At risk for diabetes Molly Giles is at higher than average risk for developing diabetes due to her obesity and insulin resistance. She currently denies polyuria or polydipsia.  ALLERGIES: Allergies  Allergen Reactions  . Sulfacetamide Sodium Itching  . Codeine Nausea Only    Unknown   . Hibiclens [Chlorhexidine Gluconate] Other (See Comments)    Burning  . Lyrica [Pregabalin] Other (See Comments)    Dizziness   . Sulfa Antibiotics Itching  . Sulfamethoxazole Itching  . Betadine [Povidone Iodine] Rash    burning  . Latex Rash and Itching    burning  . Wellbutrin [Bupropion]  Anxiety    MEDICATIONS: Current Outpatient Prescriptions on File Prior to Visit  Medication Sig Dispense Refill  . amLODipine (NORVASC) 2.5 MG tablet Take 1 tablet (2.5 mg total) by mouth daily. NEEDS APPOINTMENT FOR FUTURE REFILLS OR 90-DAY SUPPLY 90 tablet 3  . Azelastine-Fluticasone (DYMISTA) 137-50 MCG/ACT SUSP Place 1 spray into the nose daily as needed.     . citalopram (CELEXA) 20 MG tablet Take 1 tablet (20 mg total) by mouth daily. 90 tablet 1  . eletriptan (RELPAX) 40 MG tablet Take 1 tablet (40 mg total) by mouth as needed for migraine. may repeat in 2 hours if necessary 10 tablet 0  . ibuprofen (ADVIL,MOTRIN) 200 MG tablet Take 800 mg by mouth.     . levothyroxine (SYNTHROID, LEVOTHROID) 125 MCG tablet TAKE 1/2 TABLET BY MOUTH DAILY 90 tablet 1  . loratadine (CLARITIN) 10 MG tablet Take 10 mg by mouth daily.    . methocarbamol (ROBAXIN) 500 MG tablet Take 1 tablet (500 mg total) by mouth every 6 (six) hours as needed for muscle spasms. 40 tablet 0  . nystatin-triamcinolone (MYCOLOG II) cream Apply 1 application topically 2 (two) times daily.     . prednisoLONE acetate (PRED FORTE) 1 % ophthalmic suspension Place 1 drop into both eyes daily.    . Probiotic Product ([place]) Take by mouth.    . rosuvastatin (CRESTOR) 10 MG tablet Take 1 tablet (10 mg total) by mouth daily. 90 tablet 3  .  Topiramate ER (QUDEXY XR) 25 MG CS24 Topiramate ER: First week: 25mg (one pill) at night, Second week: 50mg (two pills) at night, Third week: 75mg  (3 pills) at night. Then increase to 100mg  (4 pills at night) 30 each 6  . traMADol (ULTRAM) 50 MG tablet Take 2 tablets (100 mg total) by mouth 2 (two) times daily. 120 tablet 0  . traZODone (DESYREL) 50 MG tablet Take 1 tablet (50 mg total) by mouth at bedtime. 90 tablet 1  . zolpidem (AMBIEN) 10 MG tablet Take 1 tablet (10 mg total) by mouth at bedtime as needed. 90 tablet 1   No current facility-administered medications on file prior  to visit.     PAST MEDICAL HISTORY: Past Medical History:  Diagnosis Date  . Allergic rhinoconjunctivitis 07/02/2015  . Arthritis   . Benign positional vertigo 04/2015   Responded well to Vestibular Rehab  . Bruxism (teeth grinding)   . Chronic migraine 02/25/2016   Per Dr Jaynee Eagles review in notes from [facility] from September 2015. Showed total headache days last month 18. Severe headache days 7 days. Moderate headache days 5 days. Mild headache days last month sick days. Days without headache last month 10 days. Symptoms associated with photophobia, phonophobia, osmophobia, neck pain, dizziness, jaw pain, nasal congestion, vision disturbances, tingling and numbness, weakness and worsening with activity. Each headache attack last 3 hours depending on treatment in severity. Left side, the right side, easier side, the frontal area in the back of the head. Characterized as throbbing, pressure, tightness, squeezing, stabbing and burning   . Chronic migraine w/o aura w/o status migrainosus, not intractable 07/14/2015  . DDD (degenerative disc disease), lumbar   . Degenerative cervical disc   . Depression   . Dysrhythmia    seen by dr Wynonia Lawman- not a problem since she has been on Bystolic  . Essential hypertension 05/15/2013  . Family history of adverse reaction to anesthesia    Brother- N/V  . Family history of premature CAD 05/15/2013  . Fibromyalgia   . GERD (gastroesophageal reflux disease) 12/16/2014  . H/O seasonal allergies   . Hearing loss of both ears 07/27/2015   mild to borderline moderate low frequency hearing loss improving to within normal limits bilaterally on audiology testing at [facility] in November 2016.    Molly Giles Kitchen History of Clostridium difficile colitis 07/01/2015   Required Fecal Transplantation tocure  . History of irritable bowel syndrome 02/15/2016  . History of migraine headaches 05/15/2013   Per Dr Jaynee Eagles review in notes from [facility]  from September 2015. Showed total headache days last month 18. Severe headache days 7 days. Moderate headache days 5 days. Mild headache days last month sick days. Days without headache last month 10 days. Symptoms associated with photophobia, phonophobia, osmophobia, neck pain, dizziness, jaw pain, nasal congestion, vision disturbances, tingling and numbness, weakness and worsening with activity. Each headache attack last 3 hours depending on treatment in severity. Left side, the right side, easier side, the frontal area in the back of the head. Characterized as throbbing, pressure, tightness, squeezing, stabbing and burning    . Hx of bad fall 02/2015   Severe Facial/head trauma without fracture  . Hyperlipidemia 1998  . Hypertension 2004  . Hypothyroidism   . Impairment of balance 02/2015   Consequent of postconcussive syndrome  . Interstitial cystitis   . 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 Migraine 1962   Chronic Migraines  . Mood disorder ([place])   .  Musculoskeletal neck pain 07/14/2015  . Normal coronary arteries 05/14/2014  . Osteoarthritis of left hip 11/01/2015   MRI order by Dr Alvan Dame (ortho) 10/2015 showed significant arthritis of left hip joint with cystic changes in femoral head c/w osteoarthritis  . 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 Overweight   . Pain in joint of left shoulder 11/09/2016  . Palpitations 05/15/2013   [facility] manages  . Periodic limb movement sleep disorder 03/28/2017  . Perirectal cyst 05/07/2016  . PONV (postoperative nausea and vomiting)   . Post concussion syndrome 06/06/2015  . Post concussive syndrome 07/14/2015   Ms Roop's post-concussive syndrome manifesting in vertigo and headache, mood changes, poor balance, dizziness, and decreased concentration per Dr Jaynee Eagles at [facility].   Molly Giles Kitchen Posterior vitreous detachment of right eye 2014  . Shortness of breath dyspnea    with exertion  .  Thyroid nodule 08/11/2009   Findings: The thyroid gland is within normal limits in size.  The gland is diffusely inhomogeneous. A small solid nodule is noted in the lower pole  medially on the right of 7 x 6 x 8 mm. A small solid nodule is noted inferiorly on the left of 3 x 3 x 4 mm.  IMPRESSION:  The thyroid gland is within normal limits in size with only small solid nodules present, the largest of only 8 mm in diameter on the right.      PAST SURGICAL HISTORY: Past Surgical History:  Procedure Laterality Date  . Bladder dilitation     x 3  . BREAST BIOPSY  2011   Benign histology  . CARDIOVASCULAR STRESS TEST  2000   Unremarkable per pt report  . CARPOMETACARPAL JOINT ARTHROTOMY Right 2011  . COLONOSCOPY    . COLONOSCOPY WITH PROPOFOL N/A 04/21/2015   Procedure: COLONOSCOPY WITH PROPOFOL;  Surgeon: Carol Ada, MD;  Location: [place];  Service: Endoscopy;  Laterality: N/A;  . EPIDURAL BLOCK INJECTION Left 04/12/2016   Left Medial Nerve Block and Left L5 ramus block, Dr Suella Broad   . EPIDURAL BLOCK INJECTION  03/21/2016   Left L3-4 medial branch block and Left L5 & dorsal ramus block   . EPIDURAL BLOCK INJECTION N/A 10/25/2016   Suella Broad, MD. Lumbar medial branch block  . EPIDURAL BLOCK INJECTION N/A 02/09/2017   Suella Broad, MD.  Bilateral L3/4 medial branch block, bilateral L5 dorsal ramus block  . FECAL TRANSPLANT  04/21/2015   Procedure: FECAL TRANSPLANT;  Surgeon: Carol Ada, MD;  Location: [place];  Service: Endoscopy;;  . INJECTION HIP INTRA ARTICULAR Left 11/2015   for OA by Dr Suella Broad  . OTHER SURGICAL HISTORY Left 2016   Left L3/L4 medial nerve block and Left L5 Dorsal Ramus block Dr Mickel Duhamel  . TOTAL HIP ARTHROPLASTY Left 07/25/2016   Procedure: LEFT TOTAL HIP ARTHROPLASTY ANTERIOR APPROACH;  Surgeon: Paralee Cancel, MD;  Location: [facility];  Service: Orthopedics;  Laterality: Left;    SOCIAL HISTORY: Social History  Substance Use Topics  .  Smoking status: Passive Smoke Exposure - Never Smoker  . Smokeless tobacco: Never Used  . Alcohol use No     Comment: No use in 30 years.    FAMILY HISTORY: Family History  Problem Relation Age of Onset  . Alzheimer's disease Mother   . Hyperlipidemia Mother   . Hypertension Mother   . Osteoporosis Mother   . Parkinson's disease Mother   . Migraines Sister   .  Allergies Sister   . Hyperlipidemia Brother   . Cardiomyopathy Brother   . Diabetes type II Brother   . Kidney disease Brother   . Hypertension Brother   . Hyperlipidemia Brother   . Kidney disease Brother   . Heart disease Father   . Hyperlipidemia Father   . Hypertension Father   . Aortic aneurysm Father   . Early death Father 28  . Hypertension Sister   . Diabetes type II Unknown   . Breast cancer Maternal Aunt   . Asthma Brother   . Heart disease Brother     ROS: Review of Systems  Constitutional: Positive for malaise/fatigue and weight loss.  Gastrointestinal: Negative for nausea and vomiting.  Genitourinary: Negative for frequency.  Musculoskeletal:       Negative muscle weakness  Endo/Heme/Allergies: Negative for polydipsia.    PHYSICAL EXAM: Blood pressure 117/85, pulse 80, temperature 97.6 F (36.4 C), temperature source Oral, height 5\' 6"  (1.676 m), weight 195 lb (88.5 kg), SpO2 99 %. Body mass index is 31.47 kg/m. Physical Exam  Constitutional: She is oriented to person, place, and time. She appears well-developed and well-nourished.  Cardiovascular: Normal rate.   Pulmonary/Chest: Effort normal.  Musculoskeletal: Normal range of motion.  Neurological: She is oriented to person, place, and time.  Skin: Skin is warm and dry.  Psychiatric: She has a normal mood and affect. Her behavior is normal.  Vitals reviewed.   RECENT LABS AND TESTS: BMET    Component Value Date/Time   NA 142 03/22/2017 1057   K 4.8 03/22/2017 1057   CL 103 03/22/2017 1057   CO2 25 03/22/2017 1057   GLUCOSE 76  03/22/2017 1057   GLUCOSE 132 (H) 07/26/2016 0419   BUN 7 (L) 03/22/2017 1057   CREATININE 0.79 03/22/2017 1057   CREATININE 0.74 02/24/2016 1158   CALCIUM 9.4 03/22/2017 1057   GFRNONAA 81 03/22/2017 1057   GFRNONAA 88 02/24/2016 1158   GFRAA 93 03/22/2017 1057   GFRAA >89 02/24/2016 1158   Lab Results  Component Value Date   HGBA1C 5.3 03/22/2017   Lab Results  Component Value Date   INSULIN 10.5 03/22/2017   CBC    Component Value Date/Time   WBC 6.6 03/22/2017 1057   WBC 15.6 (H) 07/26/2016 0419   RBC 4.40 03/22/2017 1057   RBC 3.73 (L) 07/26/2016 0419   HGB 12.9 03/22/2017 1057   HCT 39.6 03/22/2017 1057   PLT 236 07/26/2016 0419   MCV 90 03/22/2017 1057   MCH 29.3 03/22/2017 1057   MCH 29.2 07/26/2016 0419   MCHC 32.6 03/22/2017 1057   MCHC 32.4 07/26/2016 0419   RDW 14.3 03/22/2017 1057   LYMPHSABS 2.1 03/22/2017 1057   EOSABS 0.1 03/22/2017 1057   BASOSABS 0.0 03/22/2017 1057   Iron/TIBC/Ferritin/ %Sat No results found for: IRON, TIBC, FERRITIN, IRONPCTSAT Lipid Panel     Component Value Date/Time   CHOL 152 03/22/2017 1057   TRIG 120 03/22/2017 1057   HDL 53 03/22/2017 1057   CHOLHDL 3.9 12/14/2014 1036   VLDL 26 12/14/2014 1036   LDLCALC 75 03/22/2017 1057   Hepatic Function Panel     Component Value Date/Time   PROT 6.5 03/22/2017 1057   ALBUMIN 4.4 03/22/2017 1057   AST 33 03/22/2017 1057   ALT 46 (H) 03/22/2017 1057   ALKPHOS 90 03/22/2017 1057   BILITOT 0.3 03/22/2017 1057      Component Value Date/Time   TSH 3.190 03/22/2017 1057  TSH 4.000 01/25/2017 1109   TSH 3.70 02/24/2016 1158    ASSESSMENT AND PLAN: Vitamin D deficiency - Plan: Vitamin D, Ergocalciferol, (DRISDOL) 50000 units CAPS capsule  Insulin resistance  At risk for diabetes mellitus  Class 1 obesity with serious comorbidity and body mass index (BMI) of 31.0 to 31.9 in adult, unspecified obesity type  PLAN:  Vitamin D Deficiency Molly Giles was informed that low  vitamin D levels contributes to fatigue and are associated with obesity, breast, and colon cancer. She agrees to start to take prescription Vit D @50 ,000 IU every week #4 with no refills and will follow up for routine testing of vitamin D, at least 2-3 times per year. She was informed of the risk of over-replacement of vitamin D and agrees to not increase her dose unless he discusses this with [name] first. Molly Giles agrees to follow up with our clinic in 2 weeks.  Insulin Resistance Molly Giles will continue to work on weight loss, exercise, and decreasing simple carbohydrates in her diet to help decrease the risk of diabetes. We dicussed metformin including benefits and risks. She was informed that eating too many simple carbohydrates or too many calories at one sitting increases the likelihood of GI side effects. Molly Giles declined metformin for now and prescription was not written today. We will re-check labs and Molly Giles agreed to follow up with [name] as directed to monitor her progress.  Diabetes risk counselling Molly Giles was given extended (15 minutes) diabetes prevention counseling today. She is 61 y.o. female and has risk factors for diabetes including obesity and insulin resistance. We discussed intensive lifestyle modifications today with an emphasis on weight loss as well as increasing exercise and decreasing simple carbohydrates in her diet.  Obesity Molly Giles is currently in the action stage of change. As such, her goal is to continue with weight loss efforts She has agreed to follow the Category 2 plan +100 calories Molly Giles has been instructed to work up to a goal of 150 minutes of combined cardio and strengthening exercise per week for weight loss and overall health benefits. We discussed the following Behavioral Modification Strategies today: meal planning & cooking strategies, better snacking choices, increasing lean protein intake and decreasing simple carbohydrates   Dore has agreed to follow up with our clinic  in 2 weeks. She was informed of the importance of frequent follow up visits to maximize her success with intensive lifestyle modifications for her multiple health conditions.  I, Doreene Nest, am acting as transcriptionist for Dennard Nip, MD  I have reviewed the above documentation for accuracy and completeness, and I agree with the above. -Dennard Nip, MD  OBESITY BEHAVIORAL INTERVENTION VISIT  Today's visit was # 2 out of 72.  Starting weight: 204 lbs Starting date: 03/22/17 Today's weight : 195 lbs Today's date: 04/09/2017 Total lbs lost to date: 9 (Patients must lose 7 lbs in the first 6 months to continue with counseling)   ASK: We discussed the diagnosis of obesity with Molly Giles today and Molly Giles agreed to give [name] permission to discuss obesity behavioral modification therapy today.  ASSESS: Molly Giles has the diagnosis of obesity and her BMI today is 31.5 Molly Giles is in the action stage of change   ADVISE: Molly Giles was educated on the multiple health risks of obesity as well as the benefit of weight loss to improve her health. She was advised of the need for long term treatment and the importance of lifestyle modifications.  AGREE: Multiple dietary modification options and treatment options  were discussed and  Molly Giles agreed to follow the Category 2 plan +100 calories We discussed the following Behavioral Modification Strategies today: meal planning & cooking strategies, better snacking choices, increasing lean protein intake and decreasing simple carbohydrates

## 2017-04-12 NOTE — Progress Notes (Deleted)
Office: [phone]  /  Fax: [phone]   HPI:   Chief Complaint: OBESITY  Molly Giles (MR# [id number]) is a 61 y.o. female who presents on 04/12/2017 for obesity evaluation and treatment. Current BMI is Body mass index is 31.47 kg/m.Marland Kitchen Molly Giles has struggled with obesity for years and has been unsuccessful in either losing weight or maintaining long term weight loss. Ellysa attended our information session and states she is currently in the action stage of change and ready to dedicate time achieving and maintaining a healthier weight.  Chealsey states {MWMwtlosshabits#4:210800008}   Fatigue Molly Giles feels her energy is lower than it should be. This has worsened with weight gain and {HAS HAS NOT:18834} worsened recently. Alizandra {Actions; denies/reports/admits to:19208} daytime somnolence and  {Actions; denies/reports/admits to:19208} waking up still tired. Patient is at risk for obstructive sleep apnea. Patent has a history of symptoms of {OSA Sx:17850}. Patient generally gets {numbers (fuzzy):14653} hours of sleep per night, and states they generally have {sleep quality:17851}. Snoring {is/are not:32546} present. Apneic episodes {is/are not:32546} present. Epworth Sleepiness Score is ***  Dyspnea on exertion Molly Giles notes increasing shortness of breath with exercising and seems to be worsening over time with weight gain. She notes getting out of breath sooner with activity than she used to. This {HAS HAS RDE:08144} gotten worse recently. Molly Giles denies orthopnea.  Depression Screen Molly Giles (modified PHQ-9) score was  Depression screen PHQ 2/9 03/22/2017  Decreased Interest 2  Down, Depressed, Hopeless 1  PHQ - 2 Score 3  Altered sleeping 3  Tired, decreased energy 1  Change in appetite 2  Feeling bad or failure about yourself  3  Trouble concentrating 1  Moving slowly or fidgety/restless 1  Suicidal thoughts 0  PHQ-9 Score 14  Difficult doing work/chores -    ALLERGIES: Allergies   Allergen Reactions  . Sulfacetamide Sodium Itching  . Codeine Nausea Only    Unknown   . Hibiclens [Chlorhexidine Gluconate] Other (See Comments)    Burning  . Lyrica [Pregabalin] Other (See Comments)    Dizziness   . Sulfa Antibiotics Itching  . Sulfamethoxazole Itching  . Betadine [Povidone Iodine] Rash    burning  . Latex Rash and Itching    burning  . Wellbutrin [Bupropion] Anxiety    MEDICATIONS: Current Outpatient Prescriptions on File Prior to Visit  Medication Sig Dispense Refill  . amLODipine (NORVASC) 2.5 MG tablet Take 1 tablet (2.5 mg total) by mouth daily. NEEDS APPOINTMENT FOR FUTURE REFILLS OR 90-DAY SUPPLY 90 tablet 3  . Azelastine-Fluticasone (DYMISTA) 137-50 MCG/ACT SUSP Place 1 spray into the nose daily as needed.     . citalopram (CELEXA) 20 MG tablet Take 1 tablet (20 mg total) by mouth daily. 90 tablet 1  . eletriptan (RELPAX) 40 MG tablet Take 1 tablet (40 mg total) by mouth as needed for migraine. may repeat in 2 hours if necessary 10 tablet 0  . ibuprofen (ADVIL,MOTRIN) 200 MG tablet Take 800 mg by mouth.     . levothyroxine (SYNTHROID, LEVOTHROID) 125 MCG tablet TAKE 1/2 TABLET BY MOUTH DAILY 90 tablet 1  . loratadine (CLARITIN) 10 MG tablet Take 10 mg by mouth daily.    . methocarbamol (ROBAXIN) 500 MG tablet Take 1 tablet (500 mg total) by mouth every 6 (six) hours as needed for muscle spasms. 40 tablet 0  . nystatin-triamcinolone (MYCOLOG II) cream Apply 1 application topically 2 (two) times daily.     . prednisoLONE acetate (PRED FORTE) 1 % ophthalmic  suspension Place 1 drop into both eyes daily.    . Probiotic Product ([place]) Take by mouth.    . rosuvastatin (CRESTOR) 10 MG tablet Take 1 tablet (10 mg total) by mouth daily. 90 tablet 3  . Topiramate ER (QUDEXY XR) 25 MG CS24 Topiramate ER: First week: 25mg (one pill) at night, Second week: 50mg (two pills) at night, Third week: 75mg  (3 pills) at night. Then increase to 100mg  (4  pills at night) 30 each 6  . traMADol (ULTRAM) 50 MG tablet Take 2 tablets (100 mg total) by mouth 2 (two) times daily. 120 tablet 0  . traZODone (DESYREL) 50 MG tablet Take 1 tablet (50 mg total) by mouth at bedtime. 90 tablet 1  . zolpidem (AMBIEN) 10 MG tablet Take 1 tablet (10 mg total) by mouth at bedtime as needed. 90 tablet 1   No current facility-administered medications on file prior to visit.     PAST MEDICAL HISTORY: Past Medical History:  Diagnosis Date  . Allergic rhinoconjunctivitis 07/02/2015  . Arthritis   . Benign positional vertigo 04/2015   Responded well to Vestibular Rehab  . Bruxism (teeth grinding)   . Chronic migraine 02/25/2016   Per Dr Jaynee Eagles review in notes from [facility] from September 2015. Showed total headache days last month 18. Severe headache days 7 days. Moderate headache days 5 days. Mild headache days last month sick days. Days without headache last month 10 days. Symptoms associated with photophobia, phonophobia, osmophobia, neck pain, dizziness, jaw pain, nasal congestion, vision disturbances, tingling and numbness, weakness and worsening with activity. Each headache attack last 3 hours depending on treatment in severity. Left side, the right side, easier side, the frontal area in the back of the head. Characterized as throbbing, pressure, tightness, squeezing, stabbing and burning   . Chronic migraine w/o aura w/o status migrainosus, not intractable 07/14/2015  . DDD (degenerative disc disease), lumbar   . Degenerative cervical disc   . Depression   . Dysrhythmia    seen by dr Wynonia Lawman- not a problem since she has been on Bystolic  . Essential hypertension 05/15/2013  . Family history of adverse reaction to anesthesia    Brother- N/V  . Family history of premature CAD 05/15/2013  . Fibromyalgia   . GERD (gastroesophageal reflux disease) 12/16/2014  . H/O seasonal allergies   . Hearing loss of both ears 07/27/2015   mild to borderline  moderate low frequency hearing loss improving to within normal limits bilaterally on audiology testing at [facility] in November 2016.    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 History of irritable bowel syndrome 02/15/2016  . History of migraine headaches 05/15/2013   Per Dr Jaynee Eagles review in notes from [facility] from September 2015. Showed total headache days last month 18. Severe headache days 7 days. Moderate headache days 5 days. Mild headache days last month sick days. Days without headache last month 10 days. Symptoms associated with photophobia, phonophobia, osmophobia, neck pain, dizziness, jaw pain, nasal congestion, vision disturbances, tingling and numbness, weakness and worsening with activity. Each headache attack last 3 hours depending on treatment in severity. Left side, the right side, easier side, the frontal area in the back of the head. Characterized as throbbing, pressure, tightness, squeezing, stabbing and burning    . Hx of bad fall 02/2015   Severe Facial/head trauma without fracture  . Hyperlipidemia 1998  . Hypertension 2004  .  Hypothyroidism   . Impairment of balance 02/2015   Consequent of postconcussive syndrome  . Interstitial cystitis   . 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 Migraine 1962   Chronic Migraines  . 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 Musculoskeletal neck pain 07/14/2015  . Normal coronary arteries 05/14/2014  . Osteoarthritis of left hip 11/01/2015   MRI order by Dr Alvan Dame (ortho) 10/2015 showed significant arthritis of left hip joint with cystic changes in femoral head c/w osteoarthritis  . 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 Overweight   . Pain in joint of left shoulder 11/09/2016  . Palpitations 05/15/2013   [facility] manages  . Periodic limb movement sleep disorder 03/28/2017  . Perirectal cyst 05/07/2016  . PONV  (postoperative nausea and vomiting)   . Post concussion syndrome 06/06/2015  . Post concussive syndrome 07/14/2015   Ms Arnall's post-concussive syndrome manifesting in vertigo and headache, Giles changes, poor balance, dizziness, and decreased concentration per Dr Jaynee Eagles at [facility].   Marland Kitchen Posterior vitreous detachment of right eye 2014  . Shortness of breath dyspnea    with exertion  . Thyroid nodule 08/11/2009   Findings: The thyroid gland is within normal limits in size.  The gland is diffusely inhomogeneous. A small solid nodule is noted in the lower pole  medially on the right of 7 x 6 x 8 mm. A small solid nodule is noted inferiorly on the left of 3 x 3 x 4 mm.  IMPRESSION:  The thyroid gland is within normal limits in size with only small solid nodules present, the largest of only 8 mm in diameter on the right.      PAST SURGICAL HISTORY: Past Surgical History:  Procedure Laterality Date  . Bladder dilitation     x 3  . BREAST BIOPSY  2011   Benign histology  . CARDIOVASCULAR STRESS TEST  2000   Unremarkable per pt report  . CARPOMETACARPAL JOINT ARTHROTOMY Right 2011  . COLONOSCOPY    . COLONOSCOPY WITH PROPOFOL N/A 04/21/2015   Procedure: COLONOSCOPY WITH PROPOFOL;  Surgeon: Carol Ada, MD;  Location: [place];  Service: Endoscopy;  Laterality: N/A;  . EPIDURAL BLOCK INJECTION Left 04/12/2016   Left Medial Nerve Block and Left L5 ramus block, Dr Suella Broad   . EPIDURAL BLOCK INJECTION  03/21/2016   Left L3-4 medial branch block and Left L5 & dorsal ramus block   . EPIDURAL BLOCK INJECTION N/A 10/25/2016   Suella Broad, MD. Lumbar medial branch block  . EPIDURAL BLOCK INJECTION N/A 02/09/2017   Suella Broad, MD.  Bilateral L3/4 medial branch block, bilateral L5 dorsal ramus block  . FECAL TRANSPLANT  04/21/2015   Procedure: FECAL TRANSPLANT;  Surgeon: Carol Ada, MD;  Location: [place];  Service: Endoscopy;;  . INJECTION HIP INTRA ARTICULAR Left 11/2015    for OA by Dr Suella Broad  . OTHER SURGICAL HISTORY Left 2016   Left L3/L4 medial nerve block and Left L5 Dorsal Ramus block Dr Mickel Duhamel  . TOTAL HIP ARTHROPLASTY Left 07/25/2016   Procedure: LEFT TOTAL HIP ARTHROPLASTY ANTERIOR APPROACH;  Surgeon: Paralee Cancel, MD;  Location: [facility];  Service: Orthopedics;  Laterality: Left;    SOCIAL HISTORY: Social History  Substance Use Topics  . Smoking status: Passive Smoke Exposure - Never Smoker  . Smokeless tobacco: Never Used  . Alcohol use No     Comment: No use in 30 years.    FAMILY  HISTORY: Family History  Problem Relation Age of Onset  . Alzheimer's disease Mother   . Hyperlipidemia Mother   . Hypertension Mother   . Osteoporosis Mother   . Parkinson's disease Mother   . Migraines Sister   . Allergies Sister   . Hyperlipidemia Brother   . Cardiomyopathy Brother   . Diabetes type II Brother   . Kidney disease Brother   . Hypertension Brother   . Hyperlipidemia Brother   . Kidney disease Brother   . Heart disease Father   . Hyperlipidemia Father   . Hypertension Father   . Aortic aneurysm Father   . Early death Father 72  . Hypertension Sister   . Diabetes type II Unknown   . Breast cancer Maternal Aunt   . Asthma Brother   . Heart disease Brother     ROS: ROS  PHYSICAL EXAM: Blood pressure 117/85, pulse 80, temperature 97.6 F (36.4 C), temperature source Oral, height 5\' 6"  (1.676 m), weight 195 lb (88.5 kg), SpO2 99 %. Body mass index is 31.47 kg/m. Physical Exam  RECENT LABS AND TESTS: BMET    Component Value Date/Time   NA 142 03/22/2017 1057   K 4.8 03/22/2017 1057   CL 103 03/22/2017 1057   CO2 25 03/22/2017 1057   GLUCOSE 76 03/22/2017 1057   GLUCOSE 132 (H) 07/26/2016 0419   BUN 7 (L) 03/22/2017 1057   CREATININE 0.79 03/22/2017 1057   CREATININE 0.74 02/24/2016 1158   CALCIUM 9.4 03/22/2017 1057   GFRNONAA 81 03/22/2017 1057   GFRNONAA 88 02/24/2016 1158   GFRAA 93 03/22/2017 1057   GFRAA  >89 02/24/2016 1158   Lab Results  Component Value Date   HGBA1C 5.3 03/22/2017   Lab Results  Component Value Date   INSULIN 10.5 03/22/2017   CBC    Component Value Date/Time   WBC 6.6 03/22/2017 1057   WBC 15.6 (H) 07/26/2016 0419   RBC 4.40 03/22/2017 1057   RBC 3.73 (L) 07/26/2016 0419   HGB 12.9 03/22/2017 1057   HCT 39.6 03/22/2017 1057   PLT 236 07/26/2016 0419   MCV 90 03/22/2017 1057   MCH 29.3 03/22/2017 1057   MCH 29.2 07/26/2016 0419   MCHC 32.6 03/22/2017 1057   MCHC 32.4 07/26/2016 0419   RDW 14.3 03/22/2017 1057   LYMPHSABS 2.1 03/22/2017 1057   EOSABS 0.1 03/22/2017 1057   BASOSABS 0.0 03/22/2017 1057   Iron/TIBC/Ferritin/ %Sat No results found for: IRON, TIBC, FERRITIN, IRONPCTSAT Lipid Panel     Component Value Date/Time   CHOL 152 03/22/2017 1057   TRIG 120 03/22/2017 1057   HDL 53 03/22/2017 1057   CHOLHDL 3.9 12/14/2014 1036   VLDL 26 12/14/2014 1036   LDLCALC 75 03/22/2017 1057   Hepatic Function Panel     Component Value Date/Time   PROT 6.5 03/22/2017 1057   ALBUMIN 4.4 03/22/2017 1057   AST 33 03/22/2017 1057   ALT 46 (H) 03/22/2017 1057   ALKPHOS 90 03/22/2017 1057   BILITOT 0.3 03/22/2017 1057      Component Value Date/Time   TSH 3.190 03/22/2017 1057   TSH 4.000 01/25/2017 1109   TSH 3.70 02/24/2016 1158    ECG  shows NSR with a rate of *** INDIRECT CALORIMETER done today shows a VO2 of *** and a REE of ***. Her calculated basal metabolic rate is *** thus her basal metabolic rate is {DESC; TIWPYK/DXIPJ:82505} than expected.    ASSESSMENT AND PLAN: Vitamin D  deficiency - Plan: Vitamin D, Ergocalciferol, (DRISDOL) 50000 units CAPS capsule  Insulin resistance  At risk for diabetes mellitus  Class 1 obesity with serious comorbidity and body mass index (BMI) of 31.0 to 31.9 in adult, unspecified obesity type  PLAN:  Fatigue Ashleynicole was informed that her fatigue may be related to obesity, depression or many other  causes. Labs will be ordered, and in the meanwhile Hazelgrace has agreed to work on diet, exercise and weight loss to help with fatigue. Proper sleep hygiene was discussed including the need for 7-8 hours of quality sleep each night. A sleep study {WAS/WAS NOT:[phone]::"was not"} ordered based on symptoms and Epworth score.  Dyspnea on exertion Brystal's shortness of breath appears to be obesity related and exercise induced. She has agreed to work on weight loss and gradually increase exercise to treat her exercise induced shortness of breath. If Molly Giles follows our instructions and loses weight without improvement of her shortness of breath, we will plan to refer to pulmonology. We will monitor this condition regularly. Sundy agrees to this plan.  Depression Screen Avilyn had a {mld/mod/mrk:311374} positive depression screening. Depression is commonly associated with obesity and often results in emotional eating behaviors. We will monitor this closely and work on CBT to help improve the non-hunger eating patterns. Referral to Psychology may be required if no improvement is seen as she continues in our clinic.  Risk counselling ***  Obesity Tiarra {CHL AMB IS/IS NOT:210130109} currently in the action stage of change and her goal is to {MWMwtloss#1:210800005} She has agreed to {MWMwtlossportion/plan#2:210800006} Dreya has been instructed to work up to a goal of 150 minutes of combined cardio and strengthening exercise per week for weight loss and overall health benefits. We discussed the following Behavioral Modification Stratagies today: {MWMwtlossdietstrategies#3:210800007}  Hinley has agreed to follow up with our clinic in {NUMBER 1-10:22536} weeks. She was informed of the importance of frequent follow up visits to maximize her success with intensive lifestyle modifications for her multiple health conditions. She was informed we would discuss her lab results at her next visit unless there is a critical  issue that needs to be addressed sooner. Haili agreed to keep her next visit at the agreed upon time to discuss these results.  I, Doreene Nest, am acting as transcriptionist for Dennard Nip, MD  I have reviewed the above documentation for accuracy and completeness, and I agree with the above. -Dennard Nip, MD

## 2017-04-16 ENCOUNTER — Ambulatory Visit: Payer: BLUE CROSS/BLUE SHIELD | Admitting: Rheumatology

## 2017-04-17 ENCOUNTER — Ambulatory Visit (INDEPENDENT_AMBULATORY_CARE_PROVIDER_SITE_OTHER): Payer: BLUE CROSS/BLUE SHIELD | Admitting: Neurology

## 2017-04-17 ENCOUNTER — Telehealth (INDEPENDENT_AMBULATORY_CARE_PROVIDER_SITE_OTHER): Payer: Self-pay | Admitting: Family Medicine

## 2017-04-17 ENCOUNTER — Encounter: Payer: Self-pay | Admitting: Neurology

## 2017-04-17 VITALS — BP 104/62 | Ht 66.5 in | Wt 196.4 lb

## 2017-04-17 DIAGNOSIS — G43709 Chronic migraine without aura, not intractable, without status migrainosus: Secondary | ICD-10-CM | POA: Diagnosis not present

## 2017-04-17 MED ORDER — TIZANIDINE HCL 4 MG PO TABS: 4.0000 mg | ORAL_TABLET | Freq: Four times a day (QID) | ORAL | 6 refills | Status: DC | PRN

## 2017-04-17 NOTE — Patient Instructions (Addendum)
Remember to drink plenty of fluid, eat healthy meals and do not skip any meals. Try to eat protein with a every meal and eat a healthy snack such as fruit or nuts in between meals. Try to keep a regular sleep-wake schedule and try to exercise daily, particularly in the form of walking, 20-30 minutes a day, if you can.   As far as your medications are concerned, I would like to suggest:  Discuss Candesartan with primary care and BP follow up Discuss estrogen patch with OBGYN Consider Aimovig and trial of Botox again No acute medications more than 10x a month and nerve blocks as needed [email or web address] and the NTI dental device Reorder botox  I would like to see you back in 4 months, sooner if we need to. Please call us with any interim questions, concerns, problems, updates or refill requests.   Our phone number is [phone]. We also have an after hours call service for urgent matters and there is a physician on-call for urgent questions. For any emergencies you know to call 911 or go to the nearest emergency room
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
## 2017-04-17 NOTE — Telephone Encounter (Signed)
Pt called in after seeing her Neurologist this morning stating her b/p was 104/62 and they have suggested her b/p medication be adjusted. She is feeling weak and lethargic as well. They have suggested to her the medication amLODipine (NORVASC) 2.5 MG tablet  Be stopped and she start Candesartan ( no dosage was told to her).  I spoke with Dr. Leafy Ro and she states this is an issue her pcp will need to manage. I relayed message and pt expressed understanding.  No further action needed.

## 2017-04-19 ENCOUNTER — Ambulatory Visit (INDEPENDENT_AMBULATORY_CARE_PROVIDER_SITE_OTHER): Payer: BLUE CROSS/BLUE SHIELD | Admitting: Family Medicine

## 2017-04-19 ENCOUNTER — Encounter: Payer: Self-pay | Admitting: Family Medicine

## 2017-04-19 VITALS — BP 116/86 | HR 83 | Temp 98.1°F | Ht 66.5 in | Wt 197.8 lb

## 2017-04-19 DIAGNOSIS — N951 Menopausal and female climacteric states: Secondary | ICD-10-CM

## 2017-04-19 DIAGNOSIS — R232 Flushing: Secondary | ICD-10-CM | POA: Diagnosis not present

## 2017-04-19 DIAGNOSIS — I10 Essential (primary) hypertension: Secondary | ICD-10-CM | POA: Diagnosis not present

## 2017-04-19 HISTORY — DX: Menopausal and female climacteric states: N95.1

## 2017-04-19 MED ORDER — CANDESARTAN CILEXETIL 8 MG PO TABS: 8.0000 mg | ORAL_TABLET | Freq: Every day | ORAL | 0 refills | Status: DC

## 2017-04-19 NOTE — Assessment & Plan Note (Signed)
  BP has been low recently, patient off Norvasc past 2 days with normal BP in clinic today. Per patient's neurologist candesartan is a good agent for migraines  -patient to stay off bp medications over next several days and to check BP at pharmacy -if elevated, patient would like to switch to candesartan which I think is reasonable to see if it will help her Migraines. Low dose of 8 mg prescribed if BP becomes elevated again without Norvasc -if BP persistently low to normal patient advised to call for further advice -follow up with PCP on 8/30 or sooner if needed

## 2017-04-19 NOTE — Assessment & Plan Note (Signed)
  Secondary to menopause. Explained risks of estrogen patch (increased risk of breast cancer and MI) and will no pursue this route at this time.  -advised soy products and black cohosh for relief of hot flashes -follow up as needed

## 2017-04-19 NOTE — Patient Instructions (Addendum)
   It was great seeing you today!  Please return to be seen if your BP is too low (<90/70) or if you feel lightheaded, weak, fatigued, etc.   Try soy products and OTC Black Cohosh supplements for hot flash relief.  If you have questions or concerns please do not hesitate to call at [phone].  Lucila Maine, DO PGY-2, [facility] 04/19/2017 3:23 PM

## 2017-04-19 NOTE — Progress Notes (Signed)
    Subjective:    Patient ID: Molly Giles, female    DOB: [birth year], 61 y.o.   MRN: [id number]   CC: follow up BP  BP BP has been low for past week. She checks her BP at home with automatic arm cuff which she is unsure how reliable this is. 2 readings at home were low, 104/62 and 100/70. Was told to stop taking Norvasc by her neurologist at her visit on 8/7. Neurologist recommended candesartan instead due to potential benefit for migraine control which patient suffers with chronically. She has not taken norvasc for past 2 days. She checked bp at home today it was 141/90 but in office it is normal at 116/86 She does endorse feeling sluggish and dizzy before stopping norvasc She denies CP, SOB  Hot flashes Gets them daily, they are bothersome to her Was previously on hormone therapy but stopped this Tried OTC estroven with no relief Maternal aunt had BRCA in her 62's Pt is UTD on mammograms, had a biopsy this past year but it was a cyst.  Smoking status reviewed- never smoker  Review of Systems- see HPI   Objective:  BP 116/86   Pulse 83   Temp 98.1 F (36.7 C) (Oral)   Ht 5' 6.5" (1.689 m)   Wt 197 lb 12.8 oz (89.7 kg)   SpO2 97%   BMI 31.45 kg/m  Vitals and nursing note reviewed  General: well nourished, in no acute distress Cardiac: RRR, clear S1 and S2, no murmurs, rubs, or gallops Respiratory: clear to auscultation bilaterally, no increased work of breathing Extremities: no edema or cyanosis. +2 DP pulse bilaterally  Skin: warm and dry, no rashes noted Neuro: alert and oriented, no focal deficits  Assessment & Plan:    Essential hypertension  BP has been low recently, patient off Norvasc past 2 days with normal BP in clinic today. Per patient's neurologist candesartan is a good agent for migraines  -patient to stay off bp medications over next several days and to check BP at pharmacy -if elevated, patient would like to switch to candesartan which I think is  reasonable to see if it will help her Migraines. Low dose of 8 mg prescribed if BP becomes elevated again without Norvasc -if BP persistently low to normal patient advised to call for further advice -follow up with PCP on 8/30 or sooner if needed  Hot flashes  Secondary to menopause. Explained risks of estrogen patch (increased risk of breast cancer and MI) and will no pursue this route at this time.  -advised soy products and black cohosh for relief of hot flashes -follow up as needed     Return in about 3 weeks (around 05/10/2017), or if symptoms worsen or fail to improve.   Lucila Maine, DO Family Medicine Resident PGY-2

## 2017-04-24 ENCOUNTER — Ambulatory Visit (INDEPENDENT_AMBULATORY_CARE_PROVIDER_SITE_OTHER): Payer: BLUE CROSS/BLUE SHIELD | Admitting: Family Medicine

## 2017-04-24 VITALS — BP 102/71 | HR 92 | Temp 98.3°F | Ht 66.0 in | Wt 191.0 lb

## 2017-04-24 DIAGNOSIS — E669 Obesity, unspecified: Secondary | ICD-10-CM | POA: Diagnosis not present

## 2017-04-24 DIAGNOSIS — Z6831 Body mass index (BMI) 31.0-31.9, adult: Secondary | ICD-10-CM

## 2017-04-24 DIAGNOSIS — I10 Essential (primary) hypertension: Secondary | ICD-10-CM

## 2017-04-24 DIAGNOSIS — Z6830 Body mass index (BMI) 30.0-30.9, adult: Secondary | ICD-10-CM

## 2017-04-24 NOTE — Progress Notes (Signed)
Office: [phone]  /  Fax: [phone]   HPI:   Chief Complaint: OBESITY Molly Giles is here to discuss her progress with her obesity treatment plan. She is on the  follow the Category 2 plan and is following her eating plan approximately 99 % of the time. She states she is exercising 0 minutes 0 times per week. Molly Giles continues to do well with weight loss. She plans ahead well and states hunger is well controlled. She would like more variety for breakfast.  Her weight is 191 lb (86.6 kg) today and has had a weight loss of 4 pounds over a period of 2 weeks since her last visit. She has lost 13 lbs since starting treatment with [name].  Molly Giles is a 61 y.o. female with Molly.  [name] denies chest pain or shortness of breath on exertion. She is working weight loss to help control her blood pressure with the goal of decreasing her risk of heart attack and stroke. Molly Giles blood pressure is currently controlled.   ALLERGIES: Allergies  Allergen Reactions  . Sulfacetamide Sodium Itching  . Codeine Nausea Only    Unknown   . Hibiclens [Chlorhexidine Gluconate] Other (See Comments)    Burning  . Lyrica [Pregabalin] Other (See Comments)    Dizziness   . Sulfa Antibiotics Itching  . Sulfamethoxazole Itching  . Betadine [Povidone Iodine] Rash    burning  . Latex Rash and Itching    burning  . Wellbutrin [Bupropion] Anxiety    MEDICATIONS: Current Outpatient Prescriptions on File Prior to Visit  Medication Sig Dispense Refill  . candesartan (ATACAND) 8 MG tablet Take 1 tablet (8 mg total) by mouth daily. 30 tablet 0  . citalopram (CELEXA) 20 MG tablet Take 1 tablet (20 mg total) by mouth daily. 90 tablet 1  . eletriptan (RELPAX) 40 MG tablet Take 1 tablet (40 mg total) by mouth as needed for migraine. may repeat in 2 hours if necessary 10 tablet 0  . ibuprofen (ADVIL,MOTRIN) 200 MG tablet Take 800 mg by mouth.     . levothyroxine (SYNTHROID, LEVOTHROID) 125  MCG tablet TAKE 1/2 TABLET BY MOUTH DAILY 90 tablet 1  . loratadine (CLARITIN) 10 MG tablet Take 10 mg by mouth daily.    Marland Kitchen nystatin-triamcinolone (MYCOLOG II) cream Apply 1 application topically 2 (two) times daily.     . prednisoLONE acetate (PRED FORTE) 1 % ophthalmic suspension Place 1 drop into both eyes daily.    . Probiotic Product ([place]) Take by mouth.    . rosuvastatin (CRESTOR) 10 MG tablet Take 1 tablet (10 mg total) by mouth daily. 90 tablet 3  . tiZANidine (ZANAFLEX) 4 MG tablet Take 1 tablet (4 mg total) by mouth every 6 (six) hours as needed for muscle spasms. 30 tablet 6  . traMADol (ULTRAM) 50 MG tablet Take 2 tablets (100 mg total) by mouth 2 (two) times daily. 120 tablet 0  . Vitamin D, Ergocalciferol, (DRISDOL) 50000 units CAPS capsule Take 1 capsule (50,000 Units total) by mouth every 7 (seven) days. 4 capsule 0  . zolpidem (AMBIEN) 10 MG tablet Take 1 tablet (10 mg total) by mouth at bedtime as needed. 90 tablet 1   No current facility-administered medications on file prior to visit.     PAST MEDICAL HISTORY: Past Medical History:  Diagnosis Date  . Allergic rhinoconjunctivitis 07/02/2015  . Arthritis   . Benign positional vertigo 04/2015   Responded well to [facility]hab  .  Bruxism (teeth grinding)   . Chronic migraine 02/25/2016   Per Dr Jaynee Eagles review in notes from [facility] from September 2015. Showed total headache days last month 18. Severe headache days 7 days. Moderate headache days 5 days. Mild headache days last month sick days. Days without headache last month 10 days. Symptoms associated with photophobia, phonophobia, osmophobia, neck pain, dizziness, jaw pain, nasal congestion, vision disturbances, tingling and numbness, weakness and worsening with activity. Each headache attack last 3 hours depending on treatment in severity. Left side, the right side, easier side, the frontal area in the back of the head.  Characterized as throbbing, pressure, tightness, squeezing, stabbing and burning   . Chronic migraine w/o aura w/o status migrainosus, not intractable 07/14/2015  . DDD (degenerative disc disease), lumbar   . Degenerative cervical disc   . Depression   . Dysrhythmia    seen by dr Wynonia Lawman- not a problem since she has been on Bystolic  . Essential Molly 05/15/2013  . Family history of adverse reaction to anesthesia    Brother- N/V  . Family history of premature CAD 05/15/2013  . Fibromyalgia   . GERD (gastroesophageal reflux disease) 12/16/2014  . H/O seasonal allergies   . Hearing loss of both ears 07/27/2015   mild to borderline moderate low frequency hearing loss improving to within normal limits bilaterally on audiology testing at [facility] in November 2016.    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 History of irritable bowel syndrome 02/15/2016  . History of migraine headaches 05/15/2013   Per Dr Jaynee Eagles review in notes from [facility] from September 2015. Showed total headache days last month 18. Severe headache days 7 days. Moderate headache days 5 days. Mild headache days last month sick days. Days without headache last month 10 days. Symptoms associated with photophobia, phonophobia, osmophobia, neck pain, dizziness, jaw pain, nasal congestion, vision disturbances, tingling and numbness, weakness and worsening with activity. Each headache attack last 3 hours depending on treatment in severity. Left side, the right side, easier side, the frontal area in the back of the head. Characterized as throbbing, pressure, tightness, squeezing, stabbing and burning    . Hx of bad fall 02/2015   Severe Facial/head trauma without fracture  . Hyperlipidemia 1998  . Molly 2004  . Hypothyroidism   . Impairment of balance 02/2015   Consequent of postconcussive syndrome  . Interstitial cystitis   . Meniere's  disease of right ear 12/03/2015  . Migraine 1962   Chronic Migraines  . Mood disorder ([place])   . Musculoskeletal neck pain 07/14/2015  . Normal coronary arteries 05/14/2014  . Osteoarthritis of left hip 11/01/2015   MRI order by Dr Alvan Dame (ortho) 10/2015 showed significant arthritis of left hip joint with cystic changes in femoral head c/w osteoarthritis  . 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 Overweight   . Pain in joint of left shoulder 11/09/2016  . Palpitations 05/15/2013   [facility] manages  . Periodic limb movement sleep disorder 03/28/2017  . Perirectal cyst 05/07/2016  . PONV (postoperative nausea and vomiting)   . Post concussion syndrome 06/06/2015  . Post concussive syndrome 07/14/2015   Ms Freeman's post-concussive syndrome manifesting in vertigo and headache, mood changes, poor balance, dizziness, and decreased concentration per Dr Jaynee [facility] at [facility].   Marland Kitchen Posterior vitreous detachment of right eye 2014  . Shortness of breath dyspnea  with exertion  . Thyroid nodule 08/11/2009   Findings: The thyroid gland is within normal limits in size.  The gland is diffusely inhomogeneous. A small solid nodule is noted in the lower pole  medially on the right of 7 x 6 x 8 mm. A small solid nodule is noted inferiorly on the left of 3 x 3 x 4 mm.  IMPRESSION:  The thyroid gland is within normal limits in size with only small solid nodules present, the largest of only 8 mm in diameter on the right.      PAST SURGICAL HISTORY: Past Surgical History:  Procedure Laterality Date  . Bladder dilitation     x 3  . BREAST BIOPSY  2011   Benign histology  . CARDIOVASCULAR STRESS TEST  2000   Unremarkable per pt report  . CARPOMETACARPAL JOINT ARTHROTOMY Right 2011  . COLONOSCOPY    . COLONOSCOPY WITH PROPOFOL N/A 04/21/2015   Procedure: COLONOSCOPY WITH PROPOFOL;  Surgeon: Carol Ada, MD;  Location: [place];  Service: Endoscopy;   Laterality: N/A;  . EPIDURAL BLOCK INJECTION Left 04/12/2016   Left Medial Nerve Block and Left L5 ramus block, Dr Suella Broad   . EPIDURAL BLOCK INJECTION  03/21/2016   Left L3-4 medial branch block and Left L5 & dorsal ramus block   . EPIDURAL BLOCK INJECTION N/A 10/25/2016   Suella Broad, MD. Lumbar medial branch block  . EPIDURAL BLOCK INJECTION N/A 02/09/2017   Suella Broad, MD.  Bilateral L3/4 medial branch block, bilateral L5 dorsal ramus block  . FECAL TRANSPLANT  04/21/2015   Procedure: FECAL TRANSPLANT;  Surgeon: Carol Ada, MD;  Location: [place];  Service: Endoscopy;;  . INJECTION HIP INTRA ARTICULAR Left 11/2015   for OA by Dr Suella Broad  . OTHER SURGICAL HISTORY Left 2016   Left L3/L4 medial nerve block and Left L5 Dorsal Ramus block Dr Mickel Duhamel  . TOTAL HIP ARTHROPLASTY Left 07/25/2016   Procedure: LEFT TOTAL HIP ARTHROPLASTY ANTERIOR APPROACH;  Surgeon: Paralee Cancel, MD;  Location: [facility];  Service: Orthopedics;  Laterality: Left;    SOCIAL HISTORY: Social History  Substance Use Topics  . Smoking status: Passive Smoke Exposure - Never Smoker  . Smokeless tobacco: Never Used  . Alcohol use No     Comment: No use in 30 years.    FAMILY HISTORY: Family History  Problem Relation Age of Onset  . Alzheimer's disease Mother   . Hyperlipidemia Mother   . Molly Mother   . Osteoporosis Mother   . Parkinson's disease Mother   . Migraines Sister   . Allergies Sister   . Hyperlipidemia Brother   . Cardiomyopathy Brother   . Diabetes type II Brother   . Kidney disease Brother   . Molly Brother   . Hyperlipidemia Brother   . Kidney disease Brother   . Heart disease Father   . Hyperlipidemia Father   . Molly Father   . Aortic aneurysm Father   . Early death Father 61  . Molly Sister   . Diabetes type II Unknown   . Breast cancer Maternal Aunt   . Asthma Brother   . Heart disease Brother     ROS: Review of Systems    Constitutional: Positive for weight loss.  Respiratory: Negative for shortness of breath.   Cardiovascular: Negative for chest pain.    PHYSICAL EXAM: Blood pressure 102/71, pulse 92, temperature 98.3 F (36.8 C), temperature source Oral, height 5\' 6"  (1.676 m), weight  191 lb (86.6 kg), SpO2 98 %. Body mass index is 30.83 kg/m. Physical Exam  Constitutional: She is oriented to person, place, and time. She appears well-developed and well-nourished.  Cardiovascular: Normal rate.   Pulmonary/Chest: Effort normal.  Musculoskeletal: Normal range of motion.  Neurological: She is alert and oriented to person, place, and time.  Skin: Skin is warm and dry.  Psychiatric: She has a normal mood and affect.    RECENT LABS AND TESTS: BMET    Component Value Date/Time   NA 142 03/22/2017 1057   K 4.8 03/22/2017 1057   CL 103 03/22/2017 1057   CO2 25 03/22/2017 1057   GLUCOSE 76 03/22/2017 1057   GLUCOSE 132 (H) 07/26/2016 0419   BUN 7 (L) 03/22/2017 1057   CREATININE 0.79 03/22/2017 1057   CREATININE 0.74 02/24/2016 1158   CALCIUM 9.4 03/22/2017 1057   GFRNONAA 81 03/22/2017 1057   GFRNONAA 88 02/24/2016 1158   GFRAA 93 03/22/2017 1057   GFRAA >89 02/24/2016 1158   Lab Results  Component Value Date   HGBA1C 5.3 03/22/2017   Lab Results  Component Value Date   INSULIN 10.5 03/22/2017   CBC    Component Value Date/Time   WBC 6.6 03/22/2017 1057   WBC 15.6 (H) 07/26/2016 0419   RBC 4.40 03/22/2017 1057   RBC 3.73 (L) 07/26/2016 0419   HGB 12.9 03/22/2017 1057   HCT 39.6 03/22/2017 1057   PLT 236 07/26/2016 0419   MCV 90 03/22/2017 1057   MCH 29.3 03/22/2017 1057   MCH 29.2 07/26/2016 0419   MCHC 32.6 03/22/2017 1057   MCHC 32.4 07/26/2016 0419   RDW 14.3 03/22/2017 1057   LYMPHSABS 2.1 03/22/2017 1057   EOSABS 0.1 03/22/2017 1057   BASOSABS 0.0 03/22/2017 1057   Iron/TIBC/Ferritin/ %Sat No results found for: IRON, TIBC, FERRITIN, IRONPCTSAT Lipid Panel      Component Value Date/Time   CHOL 152 03/22/2017 1057   TRIG 120 03/22/2017 1057   HDL 53 03/22/2017 1057   CHOLHDL 3.9 12/14/2014 1036   VLDL 26 12/14/2014 1036   LDLCALC 75 03/22/2017 1057   Hepatic Function Panel     Component Value Date/Time   PROT 6.5 03/22/2017 1057   ALBUMIN 4.4 03/22/2017 1057   AST 33 03/22/2017 1057   ALT 46 (H) 03/22/2017 1057   ALKPHOS 90 03/22/2017 1057   BILITOT 0.3 03/22/2017 1057      Component Value Date/Time   TSH 3.190 03/22/2017 1057   TSH 4.000 01/25/2017 1109   TSH 3.70 02/24/2016 1158    ASSESSMENT AND PLAN: Essential Molly  Class 1 obesity with serious comorbidity and body mass index (BMI) of 31.0 to 31.9 in adult, unspecified obesity type  PLAN:  Molly We discussed sodium restriction, working on healthy weight loss, and a regular exercise program as the means to achieve improved blood pressure control. [name] agreed with this plan and agreed to follow up as directed. We will continue to monitor her blood pressure as well as her progress with the above lifestyle modifications. She will continue her medications as prescribed and will watch for signs of hypotension as she continues her lifestyle modifications.   Obesity Berdina is currently in the action stage of change. As such, her goal is to continue with weight loss efforts She has agreed to follow the Category 2 plan Senetra has been instructed to work up to a goal of 150 minutes of combined cardio and strengthening exercise per week for weight  loss and overall health benefits. We discussed the following Behavioral Modification Stratagies today: increasing lean protein intake and work on meal planning and easy cooking plans   Aaren has agreed to follow up with our clinic in 2 weeks. She was informed of the importance of frequent follow up visits to maximize her success with intensive lifestyle modifications for her multiple health conditions.  We spent > than 50% of  the 15 minute visit on the counseling as documented in the note.    Office: [phone]  /  Fax: [phone]  OBESITY BEHAVIORAL INTERVENTION VISIT  Today's visit was # 3 out of 22.  Starting weight: 204 Starting date: 03/22/17 Today's weight : Weight: 191 lb (86.6 kg)  Today's date: 04/24/2017 Total lbs lost to date: 13 (Patients must lose 7 lbs in the first 6 months to continue with counseling)   ASK: We discussed the diagnosis of obesity with Gordy Savers today and Dariya agreed to give [name] permission to discuss obesity behavioral modification therapy today.  ASSESS: Idonna has the diagnosis of obesity and her BMI today is 30.9 Dalanie is in the action stage of change   ADVISE: Janea was educated on the multiple health risks of obesity as well as the benefit of weight loss to improve her health. She was advised of the need for long term treatment and the importance of lifestyle modifications.  AGREE: Multiple dietary modification options and treatment options were discussed and  Kanyia agreed to follow the Category 2 plan We discussed the following Behavioral Modification Stratagies today: increasing lean protein intake and work on meal planning and easy cooking plans    I have reviewed the above documentation for accuracy and completeness, and I agree with the above. -Lacy Duverney, PA-C  I have reviewed the above note and agree with the plan. -Dennard Nip, MD

## 2017-05-08 ENCOUNTER — Ambulatory Visit (INDEPENDENT_AMBULATORY_CARE_PROVIDER_SITE_OTHER): Payer: BLUE CROSS/BLUE SHIELD | Admitting: Physician Assistant

## 2017-05-08 VITALS — BP 92/66 | HR 86 | Temp 97.9°F | Ht 66.0 in | Wt 191.0 lb

## 2017-05-08 DIAGNOSIS — Z6830 Body mass index (BMI) 30.0-30.9, adult: Secondary | ICD-10-CM | POA: Diagnosis not present

## 2017-05-08 DIAGNOSIS — E559 Vitamin D deficiency, unspecified: Secondary | ICD-10-CM | POA: Diagnosis not present

## 2017-05-08 DIAGNOSIS — E669 Obesity, unspecified: Secondary | ICD-10-CM

## 2017-05-08 DIAGNOSIS — I10 Essential (primary) hypertension: Secondary | ICD-10-CM

## 2017-05-08 HISTORY — DX: Vitamin D deficiency, unspecified: E55.9

## 2017-05-08 MED ORDER — VITAMIN D (ERGOCALCIFEROL) 1.25 MG (50000 UNIT) PO CAPS: 50000.0000 [IU] | ORAL_CAPSULE | ORAL | 0 refills | Status: DC

## 2017-05-08 NOTE — Progress Notes (Signed)
Office: [phone]  /  Fax: [phone]   HPI:   Chief Complaint: OBESITY Molly Giles is here to discuss her progress with her obesity treatment plan. She is on the Category 2 plan and is following her eating plan approximately 85 % of the time. She states she is exercising yoga for 75 minutes 1 time per week. Molly Giles maintained her weight and states she has not been eating all of her protein. She has noticed an increase in cravings. Her weight is 191 lb (86.6 kg) today and maintained weight over a period of 2 weeks since her last visit. She has lost 13 lbs since starting treatment with [name].  Vitamin D deficiency Molly Giles has a diagnosis of vitamin D deficiency. She is currently taking vit D and denies nausea, vomiting or muscle weakness.  Hypertension Molly Giles is a 61 y.o. female with hypertension. Molly Giles denies chest pain or shortness of breath on exertion. She is working weight loss to help control her blood pressure with the goal of decreasing her risk of heart attack and stroke. Molly Giles blood pressure is currently well  controlled.     ALLERGIES: Allergies  Allergen Reactions  . Sulfacetamide Sodium Itching  . Codeine Nausea Only    Unknown   . Hibiclens [Chlorhexidine Gluconate] Other (See Comments)    Burning  . Lyrica [Pregabalin] Other (See Comments)    Dizziness   . Sulfa Antibiotics Itching  . Sulfamethoxazole Itching  . Betadine [Povidone Iodine] Rash    burning  . Latex Rash and Itching    burning  . Wellbutrin [Bupropion] Anxiety    MEDICATIONS: Current Outpatient Prescriptions on File Prior to Visit  Medication Sig Dispense Refill  . candesartan (ATACAND) 8 MG tablet Take 1 tablet (8 mg total) by mouth daily. 30 tablet 0  . citalopram (CELEXA) 20 MG tablet Take 1 tablet (20 mg total) by mouth daily. 90 tablet 1  . eletriptan (RELPAX) 40 MG tablet Take 1 tablet (40 mg total) by mouth as needed for migraine. may repeat in 2 hours if necessary 10 tablet 0    . ibuprofen (ADVIL,MOTRIN) 200 MG tablet Take 800 mg by mouth.     . levothyroxine (SYNTHROID, LEVOTHROID) 125 MCG tablet TAKE 1/2 TABLET BY MOUTH DAILY 90 tablet 1  . loratadine (CLARITIN) 10 MG tablet Take 10 mg by mouth daily.    Marland Kitchen nystatin-triamcinolone (MYCOLOG II) cream Apply 1 application topically 2 (two) times daily.     . prednisoLONE acetate (PRED FORTE) 1 % ophthalmic suspension Place 1 drop into both eyes daily.    . Probiotic Product ([place]) Take by mouth.    . rosuvastatin (CRESTOR) 10 MG tablet Take 1 tablet (10 mg total) by mouth daily. 90 tablet 3  . tiZANidine (ZANAFLEX) 4 MG tablet Take 1 tablet (4 mg total) by mouth every 6 (six) hours as needed for muscle spasms. 30 tablet 6  . traMADol (ULTRAM) 50 MG tablet Take 2 tablets (100 mg total) by mouth 2 (two) times daily. 120 tablet 0  . Vitamin D, Ergocalciferol, (DRISDOL) 50000 units CAPS capsule Take 1 capsule (50,000 Units total) by mouth every 7 (seven) days. 4 capsule 0  . zolpidem (AMBIEN) 10 MG tablet Take 1 tablet (10 mg total) by mouth at bedtime as needed. 90 tablet 1   No current facility-administered medications on file prior to visit.     PAST MEDICAL HISTORY: Past Medical History:  Diagnosis Date  . Allergic rhinoconjunctivitis  07/02/2015  . Arthritis   . Benign positional vertigo 04/2015   Responded well to [facility]hab  . Bruxism (teeth grinding)   . Chronic migraine 02/25/2016   Per Dr Jaynee Eagles review in notes from [facility] from September 2015. Showed total headache days last month 18. Severe headache days 7 days. Moderate headache days 5 days. Mild headache days last month sick days. Days without headache last month 10 days. Symptoms associated with photophobia, phonophobia, osmophobia, neck pain, dizziness, jaw pain, nasal congestion, vision disturbances, tingling and numbness, weakness and worsening with activity. Each headache attack last 3 hours depending on  treatment in severity. Left side, the right side, easier side, the frontal area in the back of the head. Characterized as throbbing, pressure, tightness, squeezing, stabbing and burning   . Chronic migraine w/o aura w/o status migrainosus, not intractable 07/14/2015  . DDD (degenerative disc disease), lumbar   . Degenerative cervical disc   . Depression   . Dysrhythmia    seen by dr Wynonia Lawman- not a problem since she has been on Bystolic  . Essential hypertension 05/15/2013  . Family history of adverse reaction to anesthesia    Brother- N/V  . Family history of premature CAD 05/15/2013  . Fibromyalgia   . GERD (gastroesophageal reflux disease) 12/16/2014  . H/O seasonal allergies   . Hearing loss of both ears 07/27/2015   mild to borderline moderate low frequency hearing loss improving to within normal limits bilaterally on audiology testing at [facility] in November 2016.    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 History of irritable bowel syndrome 02/15/2016  . History of migraine headaches 05/15/2013   Per Dr Jaynee Eagles review in notes from [facility] from September 2015. Showed total headache days last month 18. Severe headache days 7 days. Moderate headache days 5 days. Mild headache days last month sick days. Days without headache last month 10 days. Symptoms associated with photophobia, phonophobia, osmophobia, neck pain, dizziness, jaw pain, nasal congestion, vision disturbances, tingling and numbness, weakness and worsening with activity. Each headache attack last 3 hours depending on treatment in severity. Left side, the right side, easier side, the frontal area in the back of the head. Characterized as throbbing, pressure, tightness, squeezing, stabbing and burning    . Hx of bad fall 02/2015   Severe Facial/head trauma without fracture  . Hyperlipidemia 1998  . Hypertension 2004  . Hypothyroidism   . Impairment  of balance 02/2015   Consequent of postconcussive syndrome  . Interstitial cystitis   . 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 Migraine 1962   Chronic Migraines  . Mood disorder ([place])   . Musculoskeletal neck pain 07/14/2015  . Normal coronary arteries 05/14/2014  . Osteoarthritis of left hip 11/01/2015   MRI order by Dr Alvan Dame (ortho) 10/2015 showed significant arthritis of left hip joint with cystic changes in femoral head c/w osteoarthritis  . 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 Overweight   . Pain in joint of left shoulder 11/09/2016  . Palpitations 05/15/2013   [facility] manages  . Periodic limb movement sleep disorder 03/28/2017  . Perirectal cyst 05/07/2016  . PONV (postoperative nausea and vomiting)   . Post concussion syndrome 06/06/2015  . Post concussive syndrome 07/14/2015   Ms Bookwalter's post-concussive syndrome manifesting in vertigo and headache, mood changes, poor balance, dizziness, and decreased concentration per Dr Jaynee Eagles at  [facility].   Marland Kitchen Posterior vitreous detachment of right eye 2014  . Shortness of breath dyspnea    with exertion  . Thyroid nodule 08/11/2009   Findings: The thyroid gland is within normal limits in size.  The gland is diffusely inhomogeneous. A small solid nodule is noted in the lower pole  medially on the right of 7 x 6 x 8 mm. A small solid nodule is noted inferiorly on the left of 3 x 3 x 4 mm.  IMPRESSION:  The thyroid gland is within normal limits in size with only small solid nodules present, the largest of only 8 mm in diameter on the right.      PAST SURGICAL HISTORY: Past Surgical History:  Procedure Laterality Date  . Bladder dilitation     x 3  . BREAST BIOPSY  2011   Benign histology  . CARDIOVASCULAR STRESS TEST  2000   Unremarkable per pt report  . CARPOMETACARPAL JOINT ARTHROTOMY Right 2011  . COLONOSCOPY    . COLONOSCOPY WITH PROPOFOL N/A 04/21/2015   Procedure:  COLONOSCOPY WITH PROPOFOL;  Surgeon: Carol Ada, MD;  Location: [facility];  Service: Endoscopy;  Laterality: N/A;  . EPIDURAL BLOCK INJECTION Left 04/12/2016   Left Medial Nerve Block and Left L5 ramus block, Dr Suella Broad   . EPIDURAL BLOCK INJECTION  03/21/2016   Left L3-4 medial branch block and Left L5 & dorsal ramus block   . EPIDURAL BLOCK INJECTION N/A 10/25/2016   Suella Broad, MD. Lumbar medial branch block  . EPIDURAL BLOCK INJECTION N/A 02/09/2017   Suella Broad, MD.  Bilateral L3/4 medial branch block, bilateral L5 dorsal ramus block  . FECAL TRANSPLANT  04/21/2015   Procedure: FECAL TRANSPLANT;  Surgeon: Carol Ada, MD;  Location: [facility];  Service: Endoscopy;;  . INJECTION HIP INTRA ARTICULAR Left 11/2015   for OA by Dr Suella Broad  . OTHER SURGICAL HISTORY Left 2016   Left L3/L4 medial nerve block and Left L5 Dorsal Ramus block Dr Mickel Duhamel  . TOTAL HIP ARTHROPLASTY Left 07/25/2016   Procedure: LEFT TOTAL HIP ARTHROPLASTY ANTERIOR APPROACH;  Surgeon: Paralee Cancel, MD;  Location: [facility];  Service: Orthopedics;  Laterality: Left;    SOCIAL HISTORY: Social History  Substance Use Topics  . Smoking status: Passive Smoke Exposure - Never Smoker  . Smokeless tobacco: Never Used  . Alcohol use No     Comment: No use in 30 years.    FAMILY HISTORY: Family History  Problem Relation Age of Onset  . Alzheimer's disease Mother   . Hyperlipidemia Mother   . Hypertension Mother   . Osteoporosis Mother   . Parkinson's disease Mother   . Migraines Sister   . Allergies Sister   . Hyperlipidemia Brother   . Cardiomyopathy Brother   . Diabetes type II Brother   . Kidney disease Brother   . Hypertension Brother   . Hyperlipidemia Brother   . Kidney disease Brother   . Heart disease Father   . Hyperlipidemia Father   . Hypertension Father   . Aortic aneurysm Father   . Early death Father 25  . Hypertension Sister   . Diabetes type II Unknown   . Breast  cancer Maternal Aunt   . Asthma Brother   . Heart disease Brother     ROS: Review of Systems  Constitutional: Negative for weight loss.  Respiratory: Negative for shortness of breath (on exertion).   Cardiovascular: Negative for chest pain.  Gastrointestinal:  Negative for nausea and vomiting.  Musculoskeletal:       Negative muscle weakness    PHYSICAL EXAM: Blood pressure 92/66, pulse 86, temperature 97.9 F (36.6 C), temperature source Oral, height 5\' 6"  (1.676 m), weight 191 lb (86.6 kg), SpO2 96 %. Body mass index is 30.83 kg/m. Physical Exam  Constitutional: She is oriented to person, place, and time. She appears well-developed and well-nourished.  Cardiovascular: Normal rate.   Pulmonary/Chest: Effort normal.  Musculoskeletal: Normal range of motion.  Neurological: She is oriented to person, place, and time.  Skin: Skin is warm and dry.  Psychiatric: She has a normal mood and affect. Her behavior is normal.  Vitals reviewed.   RECENT LABS AND TESTS: BMET    Component Value Date/Time   NA 142 03/22/2017 1057   K 4.8 03/22/2017 1057   CL 103 03/22/2017 1057   CO2 25 03/22/2017 1057   GLUCOSE 76 03/22/2017 1057   GLUCOSE 132 (H) 07/26/2016 0419   BUN 7 (L) 03/22/2017 1057   CREATININE 0.79 03/22/2017 1057   CREATININE 0.74 02/24/2016 1158   CALCIUM 9.4 03/22/2017 1057   GFRNONAA 81 03/22/2017 1057   GFRNONAA 88 02/24/2016 1158   GFRAA 93 03/22/2017 1057   GFRAA >89 02/24/2016 1158   Lab Results  Component Value Date   HGBA1C 5.3 03/22/2017   Lab Results  Component Value Date   INSULIN 10.5 03/22/2017   CBC    Component Value Date/Time   WBC 6.6 03/22/2017 1057   WBC 15.6 (H) 07/26/2016 0419   RBC 4.40 03/22/2017 1057   RBC 3.73 (L) 07/26/2016 0419   HGB 12.9 03/22/2017 1057   HCT 39.6 03/22/2017 1057   PLT 236 07/26/2016 0419   MCV 90 03/22/2017 1057   MCH 29.3 03/22/2017 1057   MCH 29.2 07/26/2016 0419   MCHC 32.6 03/22/2017 1057   MCHC 32.4  07/26/2016 0419   RDW 14.3 03/22/2017 1057   LYMPHSABS 2.1 03/22/2017 1057   EOSABS 0.1 03/22/2017 1057   BASOSABS 0.0 03/22/2017 1057   Iron/TIBC/Ferritin/ %Sat No results found for: IRON, TIBC, FERRITIN, IRONPCTSAT Lipid Panel     Component Value Date/Time   CHOL 152 03/22/2017 1057   TRIG 120 03/22/2017 1057   HDL 53 03/22/2017 1057   CHOLHDL 3.9 12/14/2014 1036   VLDL 26 12/14/2014 1036   LDLCALC 75 03/22/2017 1057   Hepatic Function Panel     Component Value Date/Time   PROT 6.5 03/22/2017 1057   ALBUMIN 4.4 03/22/2017 1057   AST 33 03/22/2017 1057   ALT 46 (H) 03/22/2017 1057   ALKPHOS 90 03/22/2017 1057   BILITOT 0.3 03/22/2017 1057      Component Value Date/Time   TSH 3.190 03/22/2017 1057   TSH 4.000 01/25/2017 1109   TSH 3.70 02/24/2016 1158    ASSESSMENT AND PLAN: Vitamin D deficiency - Plan: Vitamin D, Ergocalciferol, (DRISDOL) 50000 units CAPS capsule  Essential hypertension  Class 1 obesity without serious comorbidity with body mass index (BMI) of 30.0 to 30.9 in adult, unspecified obesity type  PLAN:  Vitamin D Deficiency Molly Giles was informed that low vitamin D levels contributes to fatigue and are associated with obesity, breast, and colon cancer. She agrees to continue to take prescription Vit D @50 ,000 IU every week, we will refill for 1 month and will follow up for routine testing of vitamin D, at least 2-3 times per year. She was informed of the risk of over-replacement of vitamin D and agrees  to not increase her dose unless he discusses this with [name] first. Molly Giles agrees to follow up with our clinic in 2 weeks.  Hypertension We discussed sodium restriction, working on healthy weight loss, and a regular exercise program as the means to achieve improved blood pressure control. Molly Giles agreed with this plan and agreed to follow up as directed. We will continue to monitor her blood pressure as well as her progress with the above lifestyle modifications. She  will continue her medications as prescribed and will watch for signs of hypotension as she continues her lifestyle modifications.  Obesity Molly Giles is currently in the action stage of change. As such, her goal is to continue with weight loss efforts She has agreed to follow the Category 2 plan Molly Giles has been instructed to work up to a goal of 150 minutes of combined cardio and strengthening exercise per week for weight loss and overall health benefits. We discussed the following Behavioral Modification Strategies today: increasing lean protein intake and keeping healthy foods in the home  Molly Giles has agreed to follow up with our clinic in 2 weeks. She was informed of the importance of frequent follow up visits to maximize her success with intensive lifestyle modifications for her multiple health conditions.  I, Doreene Nest, am acting as transcriptionist for Lacy Duverney, PA-C  I have reviewed the above documentation for accuracy and completeness, and I agree with the above. -Lacy Duverney, PA-C  I have reviewed the above note and agree with the plan. -Dennard Nip, MD   OBESITY BEHAVIORAL INTERVENTION VISIT  Today's visit was # 4 out of 22.  Starting weight: 204 lbs Starting date: 03/22/17 Today's weight : 191 lbs  Today's date: 05/08/2017 Total lbs lost to date: 13 (Patients must lose 7 lbs in the first 6 months to continue with counseling)   ASK: We discussed the diagnosis of obesity with Molly Giles today and Molly Giles agreed to give [name] permission to discuss obesity behavioral modification therapy today.  ASSESS: Molly Giles has the diagnosis of obesity and her BMI today is 30.84 Molly Giles is in the action stage of change   ADVISE: Molly Giles was educated on the multiple health risks of obesity as well as the benefit of weight loss to improve her health. She was advised of the need for long term treatment and the importance of lifestyle modifications.  AGREE: Multiple dietary modification options  and treatment options were discussed and  Molly Giles agreed to follow the Category 2 plan We discussed the following Behavioral Modification Strategies today: increasing lean protein intake and keeping healthy foods in the home

## 2017-05-10 ENCOUNTER — Ambulatory Visit (INDEPENDENT_AMBULATORY_CARE_PROVIDER_SITE_OTHER): Payer: BLUE CROSS/BLUE SHIELD | Admitting: Family Medicine

## 2017-05-10 ENCOUNTER — Encounter: Payer: Self-pay | Admitting: Family Medicine

## 2017-05-10 ENCOUNTER — Ambulatory Visit (INDEPENDENT_AMBULATORY_CARE_PROVIDER_SITE_OTHER): Payer: BLUE CROSS/BLUE SHIELD | Admitting: Neurology

## 2017-05-10 ENCOUNTER — Telehealth: Payer: Self-pay | Admitting: Neurology

## 2017-05-10 VITALS — BP 104/70 | HR 91 | Ht 66.0 in | Wt 194.0 lb

## 2017-05-10 VITALS — BP 118/80 | HR 87 | Ht 66.0 in

## 2017-05-10 DIAGNOSIS — Z23 Encounter for immunization: Secondary | ICD-10-CM

## 2017-05-10 DIAGNOSIS — N951 Menopausal and female climacteric states: Secondary | ICD-10-CM

## 2017-05-10 DIAGNOSIS — G43709 Chronic migraine without aura, not intractable, without status migrainosus: Secondary | ICD-10-CM | POA: Diagnosis not present

## 2017-05-10 DIAGNOSIS — I10 Essential (primary) hypertension: Secondary | ICD-10-CM

## 2017-05-10 NOTE — Patient Instructions (Signed)
Dr Wendy Poet contact Dr Jaynee Eagles about trial of Premarin-like medication for postmenopausal hot flasshes. If ok with her, then will call medication into your pharmacy./  Dr Reginold Agent D, will see you in his clinic to fit you with a 24 hour blood pressure monitoring device to measure your blood pressure course off the Candesartan.   This will help [name] decide whether or not lifestyle management of your high blood pressure will be enough by itself.

## 2017-05-10 NOTE — Progress Notes (Signed)

## 2017-05-10 NOTE — Progress Notes (Signed)
Botox-100unitsx2 vials Lot: [id number] Expiration: 11/2019 NDC: 4008-6761-95 [id number]  Lot: [id number] Expiration: 10/2019 SNK:5397-6734-19  0.9% Sodium Chloride bacteriostatic- 35mL total Lot: [id number] Expiration: 02/09/2018 NDC: 3790-2409-73  Dx: Z32.992 B/B

## 2017-05-10 NOTE — Telephone Encounter (Signed)
Per Dr. Jaynee Eagles, Pt needs botox in 3 mos.

## 2017-05-11 ENCOUNTER — Encounter: Payer: Self-pay | Admitting: Family Medicine

## 2017-05-11 NOTE — Assessment & Plan Note (Signed)
Established problem. BP Readings from Last 3 Encounters:  05/10/17 118/80  05/10/17 104/70  05/08/17 92/66  Recent relatively low office BP measurements that differ significantly from elevated home BP measurements above goal of less than 150/90 (JNC 8 - AAFP.)   Plan: Home ambulatory BP monitoring for 24 hours off of candesartan to see if patient needs BP med with recent weight loss and lifestyle changes.

## 2017-05-11 NOTE — Progress Notes (Signed)
Patient ID: Molly Giles, female    DOB: 07-10-1956, 60 y.o.   MRN: [id number] Molly Giles is alone Sources of clinical information for visit is/are patient. Nursing assessment for this office visit was reviewed with the patient for accuracy and revision.  Depression screen PHQ 2/9 05/10/2017  Decreased Interest 0  Down, Depressed, Hopeless 0  PHQ - 2 Score 0  Altered sleeping -  Tired, decreased energy -  Change in appetite -  Feeling bad or failure about yourself  -  Trouble concentrating -  Moving slowly or fidgety/restless -  Suicidal thoughts -  PHQ-9 Score -  Difficult doing work/chores -   Fall Risk  07/02/2015 07/02/2015 04/08/2015 01/29/2015  Falls in the past year? - No No No  Risk for fall due to : Impaired balance/gait History of fall(s) - -    HPI  CHRONIC HYPERTENSION  Disease Monitoring  Blood pressure range: variable measurement at home but often >160/80  Chest pain: no   Dyspnea: no   Claudication: no   Medication compliance: yes, change to candesartan 8 mg 8/9 with stopping Amlodipine bc low SBP  Medication Side Effects  Lightheadedness: no   Urinary frequency: no   Edema: no    Preventitive Healthcare:  Exercise: 75 minutes of cardiac and strengthening  Diet Pattern:  yes, has lost over 10 pounds since last ov.  Molly Giles is participating in [facility] [employer] [facility]. High protein, often  foods prepared by [facility]  Salt Restriction: no  Hot Flashes - Onset around age 45 - Frequency of hot flashes 30 to 40 times a day, associated with drenching sweats, - Duration about 5 to 10 min each. - Intermittent pattern.  Course worsening last year.  Occur both spontaneously and if stressed, elg. At work.  - Few nocturnal hot flashes/sweats.  - Currently on Citalopram and has been for years.  Intolerant of Lyrica in past ==> dizziness.  Recalls having problem tolerating gabapentin too.    - All ready keeps room temperatures low, working  on weight loss and exercise.  - Symptoms significantly impairing her life.  - PMH: chronic migraines for which she sees Molly Giles (Neuro). Pt denies visual, tactile, and speech auras.  No history of motor weakness with migraines.   - No personal nor first degree family history of Breast Cancer, no personal nor first degree family history of blood clots in legs or lungs; no history cardiovascular events. No liver disease history   SH: No smoking.    Review of Systems  No vaginal bleeding No rashes No leg swelling No shortness of breath No cough     Objective:   Physical Exam VS reviewed GEN: Alert, Cooperative, Groomed, NAD COR: RRR, No M/G/R, No JVD, Normal PMI size and location LUNGS: BCTA, No Acc mm use, speaking in full sentences EXT: No peripheral leg edema.  Gait: Normal speed, No significant path deviation, Step through +,  Psych: Normal affect/thought/speech/language    Assessment & Plan:  See problem list

## 2017-05-11 NOTE — Assessment & Plan Note (Signed)
Established problem with new presentation to examiner Severe symptoms present for many years with progression that interfering with life If Dr Ahern(Neuro) gives opinion that a trial of Prempro 0.3 mg/1.5 mg is not contraindicated, will send in Rx with follow up in 6 to 12 weeks to assess response and tolerance.   \ Patient has GYN whom she will see for her Pap screen (history of abnormal paps)

## 2017-05-11 NOTE — Progress Notes (Signed)
   Subjective:    Patient ID: Molly Giles, female    DOB: Aug 05, 1956, 61 y.o.   MRN: [id number] ZAYNAB CHIPMAN is alone Sources of clinical information for visit is/are patient. Nursing assessment for this office visit was reviewed with the patient for accuracy and revision.  Depression screen PHQ 2/9 05/10/2017  Decreased Interest 0  Down, Depressed, Hopeless 0  PHQ - 2 Score 0  Altered sleeping -  Tired, decreased energy -  Change in appetite -  Feeling bad or failure about yourself  -  Trouble concentrating -  Moving slowly or fidgety/restless -  Suicidal thoughts -  PHQ-9 Score -  Difficult doing work/chores -   Fall Risk  07/02/2015 07/02/2015 04/08/2015 01/29/2015  Falls in the past year? - No No No  Risk for fall due to : Impaired balance/gait History of fall(s) - -    HPI  CHRONIC HYPERTENSION  Disease Monitoring  Blood pressure range: variable measurement at home but often >160/80  Chest pain: no   Dyspnea: no   Claudication: no   Medication compliance: yes, change to candesartan 8 mg 8/9 with stopping Amlodipine bc low SBP  Medication Side Effects  Lightheadedness: no   Urinary frequency: no   Edema: no    Preventitive Healthcare:  Exercise: 75 minutes of cardiac and strengthening  Diet Pattern:  yes, has lost over 10 pounds since last ov.  Mrs Guthridge is participating in [facility] [employer] [facility]. High protein, often  foods prepared by [facility]  Salt Restriction: no  Hot Flashes - Onset around age 48 - Frequency of hot flashes 30 to 40 times a day, associated with drenching sweats, - Duration about 5 to 10 min each. - Intermittent pattern.  Course worsening last year.  Occur both spontaneously and if stressed, elg. At work.  - Few nocturnal hot flashes/sweats.  - Currently on Citalopram and has been for years.  Intolerant of Lyrica in past ==> dizziness.  Recalls having problem tolerating gabapentin too.    - All ready keeps room  temperatures low, working on weight loss and exercise.  - Symptoms significantly impairing her life.  - PMH: chronic migraines for which she sees Dr Jaynee Eagles (Neuro). Pt denies visual, tactile, and speech auras.  No history of motor weakness with migraines.   - No personal nor first degree family history of Breast Cancer, no personal nor first degree family history of blood clots in legs or lungs; no history cardiovascular events. No liver disease history   SH: No smoking.    Review of Systems  No vaginal bleeding No rashes No leg swelling No shortness of breath No cough     Objective:   Physical Exam VS reviewed GEN: Alert, Cooperative, Groomed, NAD COR: RRR, No M/G/R, No JVD, Normal PMI size and location LUNGS: BCTA, No Acc mm use, speaking in full sentences EXT: No peripheral leg edema.  Gait: Normal speed, No significant path deviation, Step through +,  Psych: Normal affect/thought/speech/language    Assessment & Plan:  See problem list

## 2017-05-15 ENCOUNTER — Telehealth: Payer: Self-pay | Admitting: Family Medicine

## 2017-05-15 DIAGNOSIS — N951 Menopausal and female climacteric states: Secondary | ICD-10-CM

## 2017-05-15 MED ORDER — METHYLPREDNISOLONE 4 MG PO TBPK: ORAL_TABLET | ORAL | 2 refills | Status: DC

## 2017-05-15 MED ORDER — CONJ ESTROG-MEDROXYPROGEST ACE 0.3-1.5 MG PO TABS: 1.0000 | ORAL_TABLET | Freq: Every day | ORAL | 3 refills | Status: DC

## 2017-05-15 NOTE — Telephone Encounter (Signed)
I spoke with Dr. Jaynee Eagles. Pt may fly after botox injections. Dr. Jaynee Eagles gave me a verbal order for the medrol dose pack, 21 tablets and 2 refills. I called pt. I advised her of this information. Pt is agreeable to the steroids and asked that it be sent to [facility] in [facility]. Pt verbalized understanding of side effects and Dr. Jaynee Eagles recommendations.

## 2017-05-15 NOTE — Telephone Encounter (Signed)
Dr Jaynee Eagles does not object to use of estrogen hormonal therapy for Mrs Sawtelle.  I discussed this with Mrs Littles.   Will start Prempro 0.3/1.5 daily #30 with RF-3.  Mrs Broadwell will follow up with me in three months. Mrs Trigg may call to request increase dose of Prempro in meantime if she is getting a response in her vasomotor symptoms but and is tolerating medication and would like to try to further reduce vasomotor symptoms with higher dose Prempro.

## 2017-05-15 NOTE — Telephone Encounter (Signed)
I called the patient to schedule her next injection. She wanted to know if she will be able to fly the day following her injections? She would also like to know if Dr. Jaynee Eagles has called in steroids for her. She had spoken with Dr. Jaynee Eagles regarding this. Please call and advise.

## 2017-05-15 NOTE — Addendum Note (Signed)
Addended by: Lester Varina A on: 05/15/2017 02:05 PM   Modules accepted: Orders

## 2017-05-16 ENCOUNTER — Encounter: Payer: Self-pay | Admitting: *Deleted

## 2017-05-16 ENCOUNTER — Other Ambulatory Visit: Payer: Self-pay | Admitting: Rheumatology

## 2017-05-16 DIAGNOSIS — M797 Fibromyalgia: Secondary | ICD-10-CM

## 2017-05-17 NOTE — Telephone Encounter (Signed)
Last Visit: 11/13/16 Next visit: 06/26/17  Okay to refill Ambien?

## 2017-05-17 NOTE — Telephone Encounter (Signed)
ok 

## 2017-05-24 ENCOUNTER — Ambulatory Visit: Payer: BLUE CROSS/BLUE SHIELD | Admitting: Pharmacist

## 2017-05-24 ENCOUNTER — Ambulatory Visit (INDEPENDENT_AMBULATORY_CARE_PROVIDER_SITE_OTHER): Payer: BLUE CROSS/BLUE SHIELD | Admitting: Physician Assistant

## 2017-05-24 VITALS — BP 102/72 | HR 86 | Temp 98.2°F | Ht 66.0 in | Wt 189.0 lb

## 2017-05-24 DIAGNOSIS — I10 Essential (primary) hypertension: Secondary | ICD-10-CM

## 2017-05-24 DIAGNOSIS — Z6830 Body mass index (BMI) 30.0-30.9, adult: Secondary | ICD-10-CM

## 2017-05-24 DIAGNOSIS — E669 Obesity, unspecified: Secondary | ICD-10-CM

## 2017-05-24 NOTE — Progress Notes (Signed)
Office: [phone]  /  Fax: [phone]   HPI:   Chief Complaint: OBESITY Molly Giles is here to discuss her progress with her obesity treatment plan. She is on the Category 2 plan and is following her eating plan approximately 90 % of the time. She states she is exercising 0 minutes 0 times per week. Molly Giles continues to do well with weight loss. She plans her meals ahead well and hunger is controlled. Molly Giles would like more variety at lunch. Her weight is 189 lb (85.7 kg) today and has had a weight loss of 2 pounds over a period of 3 weeks since her last visit. She has lost 15 lbs since starting treatment with [name].  Hypertension Molly Giles is a 61 y.o. female with hypertension. She has had a couple of episodes of low blood pressure. Her Cardiologist took away candesartan and she states blood pressure has been stable. Molly Giles denies chest pain or shortness of breath on exertion. She is working weight loss to help control her blood pressure with the goal of decreasing her risk of heart attack and stroke. Molly Giles blood pressure is currently controlled.    ALLERGIES: Allergies  Allergen Reactions  . Sulfacetamide Sodium Itching  . Codeine Nausea Only    Unknown   . Hibiclens [Chlorhexidine Gluconate] Other (See Comments)    Burning  . Lyrica [Pregabalin] Other (See Comments)    Dizziness   . Sulfa Antibiotics Itching  . Sulfamethoxazole Itching  . Betadine [Povidone Iodine] Rash    burning  . Latex Rash and Itching    burning  . Wellbutrin [Bupropion] Anxiety    MEDICATIONS: Current Outpatient Prescriptions on File Prior to Visit  Medication Sig Dispense Refill  . citalopram (CELEXA) 20 MG tablet Take 1 tablet (20 mg total) by mouth daily. 90 tablet 1  . eletriptan (RELPAX) 40 MG tablet Take 1 tablet (40 mg total) by mouth as needed for migraine. may repeat in 2 hours if necessary 10 tablet 0  . estrogen, conjugated,-medroxyprogesterone (PREMPRO) 0.3-1.5 MG tablet Take 1  tablet by mouth at bedtime. 30 tablet 3  . ibuprofen (ADVIL,MOTRIN) 200 MG tablet Take 800 mg by mouth.     . levothyroxine (SYNTHROID, LEVOTHROID) 125 MCG tablet TAKE 1/2 TABLET BY MOUTH DAILY 90 tablet 1  . loratadine (CLARITIN) 10 MG tablet Take 10 mg by mouth daily.    . methylPREDNISolone (MEDROL DOSEPAK) 4 MG TBPK tablet Follow package directions 21 tablet 2  . nystatin-triamcinolone (MYCOLOG II) cream Apply 1 application topically 2 (two) times daily.     . prednisoLONE acetate (PRED FORTE) 1 % ophthalmic suspension Place 1 drop into both eyes daily.    . Probiotic Product ([place]) Take by mouth.    . rosuvastatin (CRESTOR) 10 MG tablet Take 1 tablet (10 mg total) by mouth daily. 90 tablet 3  . tiZANidine (ZANAFLEX) 4 MG tablet Take 1 tablet (4 mg total) by mouth every 6 (six) hours as needed for muscle spasms. 30 tablet 6  . traMADol (ULTRAM) 50 MG tablet Take 2 tablets (100 mg total) by mouth 2 (two) times daily. 120 tablet 0  . Vitamin D, Ergocalciferol, (DRISDOL) 50000 units CAPS capsule Take 1 capsule (50,000 Units total) by mouth every 7 (seven) days. 4 capsule 0  . zolpidem (AMBIEN) 10 MG tablet TAKE 1 TABLET BY MOUTH AT BEDTIME AS NEEDED 90 tablet 0   No current facility-administered medications on file prior to visit.  PAST MEDICAL HISTORY: Past Medical History:  Diagnosis Date  . Allergic rhinoconjunctivitis 07/02/2015  . Arthritis   . Benign positional vertigo 04/2015   Responded well to Vestibular Rehab  . Bruxism (teeth grinding)   . Chronic migraine 02/25/2016   Per Dr Jaynee Eagles review in notes from [facility] from September 2015. Showed total headache days last month 18. Severe headache days 7 days. Moderate headache days 5 days. Mild headache days last month sick days. Days without headache last month 10 days. Symptoms associated with photophobia, phonophobia, osmophobia, neck pain, dizziness, jaw pain, nasal congestion, vision  disturbances, tingling and numbness, weakness and worsening with activity. Each headache attack last 3 hours depending on treatment in severity. Left side, the right side, easier side, the frontal area in the back of the head. Characterized as throbbing, pressure, tightness, squeezing, stabbing and burning   . Chronic migraine w/o aura w/o status migrainosus, not intractable 07/14/2015  . DDD (degenerative disc disease), lumbar   . Degenerative cervical disc   . Depression   . Dysrhythmia    seen by dr Wynonia Lawman- not a problem since she has been on Bystolic  . Essential hypertension 05/15/2013  . Family history of adverse reaction to anesthesia    Brother- N/V  . Family history of premature CAD 05/15/2013  . Fibromyalgia   . GERD (gastroesophageal reflux disease) 12/16/2014  . H/O seasonal allergies   . Hearing loss of both ears 07/27/2015   mild to borderline moderate low frequency hearing loss improving to within normal limits bilaterally on audiology testing at [facility] in November 2016.    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 History of irritable bowel syndrome 02/15/2016  . History of migraine headaches 05/15/2013   Per Dr Jaynee Eagles review in notes from [facility] from September 2015. Showed total headache days last month 18. Severe headache days 7 days. Moderate headache days 5 days. Mild headache days last month sick days. Days without headache last month 10 days. Symptoms associated with photophobia, phonophobia, osmophobia, neck pain, dizziness, jaw pain, nasal congestion, vision disturbances, tingling and numbness, weakness and worsening with activity. Each headache attack last 3 hours depending on treatment in severity. Left side, the right side, easier side, the frontal area in the back of the head. Characterized as throbbing, pressure, tightness, squeezing, stabbing and burning    . Hx of bad fall 02/2015    Severe Facial/head trauma without fracture  . Hyperlipidemia 1998  . Hypertension 2004  . Hypothyroidism   . Impairment of balance 02/2015   Consequent of postconcussive syndrome  . Interstitial cystitis   . 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 Migraine 1962   Chronic Migraines  . Mood disorder ([place])   . Musculoskeletal neck pain 07/14/2015  . Normal coronary arteries 05/14/2014  . Osteoarthritis of left hip 11/01/2015   MRI order by Dr Alvan Dame (ortho) 10/2015 showed significant arthritis of left hip joint with cystic changes in femoral head c/w osteoarthritis  . 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 Overweight   . Pain in joint of left shoulder 11/09/2016  . Palpitations 05/15/2013   [facility] manages  . Periodic limb movement sleep disorder 03/28/2017  . Perirectal cyst 05/07/2016  . PONV (postoperative nausea and vomiting)   . Post concussion syndrome 06/06/2015  . Post concussive syndrome 07/14/2015   Ms Watchman's post-concussive syndrome manifesting in vertigo and  headache, mood changes, poor balance, dizziness, and decreased concentration per Dr Jaynee Eagles at [facility].   Marland Kitchen Posterior vitreous detachment of right eye 2014  . Shortness of breath dyspnea    with exertion  . Thyroid nodule 08/11/2009   Findings: The thyroid gland is within normal limits in size.  The gland is diffusely inhomogeneous. A small solid nodule is noted in the lower pole  medially on the right of 7 x 6 x 8 mm. A small solid nodule is noted inferiorly on the left of 3 x 3 x 4 mm.  IMPRESSION:  The thyroid gland is within normal limits in size with only small solid nodules present, the largest of only 8 mm in diameter on the right.    . Vasomotor symptoms due to menopause 04/19/2017    PAST SURGICAL HISTORY: Past Surgical History:  Procedure Laterality Date  . Bladder dilitation     x 3  . BREAST BIOPSY  2011   Benign histology  . CARDIOVASCULAR  STRESS TEST  2000   Unremarkable per pt report  . CARPOMETACARPAL JOINT ARTHROTOMY Right 2011  . COLONOSCOPY    . COLONOSCOPY WITH PROPOFOL N/A 04/21/2015   Procedure: COLONOSCOPY WITH PROPOFOL;  Surgeon: Carol Ada, MD;  Location: [facility];  Service: Endoscopy;  Laterality: N/A;  . EPIDURAL BLOCK INJECTION Left 04/12/2016   Left Medial Nerve Block and Left L5 ramus block, Dr Suella Broad   . EPIDURAL BLOCK INJECTION  03/21/2016   Left L3-4 medial branch block and Left L5 & dorsal ramus block   . EPIDURAL BLOCK INJECTION N/A 10/25/2016   Suella Broad, MD. Lumbar medial branch block  . EPIDURAL BLOCK INJECTION N/A 02/09/2017   Suella Broad, MD.  Bilateral L3/4 medial branch block, bilateral L5 dorsal ramus block  . FECAL TRANSPLANT  04/21/2015   Procedure: FECAL TRANSPLANT;  Surgeon: Carol Ada, MD;  Location: [facility];  Service: Endoscopy;;  . INJECTION HIP INTRA ARTICULAR Left 11/2015   for OA by Dr Suella Broad  . OTHER SURGICAL HISTORY Left 2016   Left L3/L4 medial nerve block and Left L5 Dorsal Ramus block Dr Mickel Duhamel  . TOTAL HIP ARTHROPLASTY Left 07/25/2016   Procedure: LEFT TOTAL HIP ARTHROPLASTY ANTERIOR APPROACH;  Surgeon: Paralee Cancel, MD;  Location: [facility];  Service: Orthopedics;  Laterality: Left;    SOCIAL HISTORY: Social History  Substance Use Topics  . Smoking status: Passive Smoke Exposure - Never Smoker  . Smokeless tobacco: Never Used  . Alcohol use No     Comment: No use in 30 years.    FAMILY HISTORY: Family History  Problem Relation Age of Onset  . Alzheimer's disease Mother   . Hyperlipidemia Mother   . Hypertension Mother   . Osteoporosis Mother   . Parkinson's disease Mother   . Migraines Sister   . Allergies Sister   . Hyperlipidemia Brother   . Cardiomyopathy Brother   . Diabetes type II Brother   . Kidney disease Brother   . Hypertension Brother   . Hyperlipidemia Brother   . Kidney disease Brother   . Heart disease Father     . Hyperlipidemia Father   . Hypertension Father   . Aortic aneurysm Father   . Early death Father 32  . Hypertension Sister   . Diabetes type II Unknown   . Breast cancer Maternal Aunt   . Asthma Brother   . Heart disease Brother     ROS: Review of Systems  Constitutional:  Positive for weight loss.  Respiratory: Negative for shortness of breath (on exertion).   Cardiovascular: Negative for chest pain.    PHYSICAL EXAM: Blood pressure 102/72, pulse 86, temperature 98.2 F (36.8 C), height 5\' 6"  (1.676 m), weight 189 lb (85.7 kg), SpO2 97 %. Body mass index is 30.51 kg/m. Physical Exam  Constitutional: She is oriented to person, place, and time. She appears well-developed and well-nourished.  Cardiovascular: Normal rate.   Pulmonary/Chest: Effort normal.  Musculoskeletal: Normal range of motion.  Neurological: She is oriented to person, place, and time.  Skin: Skin is warm and dry.  Psychiatric: She has a normal mood and affect. Her behavior is normal.  Vitals reviewed.   RECENT LABS AND TESTS: BMET    Component Value Date/Time   NA 142 03/22/2017 1057   K 4.8 03/22/2017 1057   CL 103 03/22/2017 1057   CO2 25 03/22/2017 1057   GLUCOSE 76 03/22/2017 1057   GLUCOSE 132 (H) 07/26/2016 0419   BUN 7 (L) 03/22/2017 1057   CREATININE 0.79 03/22/2017 1057   CREATININE 0.74 02/24/2016 1158   CALCIUM 9.4 03/22/2017 1057   GFRNONAA 81 03/22/2017 1057   GFRNONAA 88 02/24/2016 1158   GFRAA 93 03/22/2017 1057   GFRAA >89 02/24/2016 1158   Lab Results  Component Value Date   HGBA1C 5.3 03/22/2017   Lab Results  Component Value Date   INSULIN 10.5 03/22/2017   CBC    Component Value Date/Time   WBC 6.6 03/22/2017 1057   WBC 15.6 (H) 07/26/2016 0419   RBC 4.40 03/22/2017 1057   RBC 3.73 (L) 07/26/2016 0419   HGB 12.9 03/22/2017 1057   HCT 39.6 03/22/2017 1057   PLT 236 07/26/2016 0419   MCV 90 03/22/2017 1057   MCH 29.3 03/22/2017 1057   MCH 29.2 07/26/2016  0419   MCHC 32.6 03/22/2017 1057   MCHC 32.4 07/26/2016 0419   RDW 14.3 03/22/2017 1057   LYMPHSABS 2.1 03/22/2017 1057   EOSABS 0.1 03/22/2017 1057   BASOSABS 0.0 03/22/2017 1057   Iron/TIBC/Ferritin/ %Sat No results found for: IRON, TIBC, FERRITIN, IRONPCTSAT Lipid Panel     Component Value Date/Time   CHOL 152 03/22/2017 1057   TRIG 120 03/22/2017 1057   HDL 53 03/22/2017 1057   CHOLHDL 3.9 12/14/2014 1036   VLDL 26 12/14/2014 1036   LDLCALC 75 03/22/2017 1057   Hepatic Function Panel     Component Value Date/Time   PROT 6.5 03/22/2017 1057   ALBUMIN 4.4 03/22/2017 1057   AST 33 03/22/2017 1057   ALT 46 (H) 03/22/2017 1057   ALKPHOS 90 03/22/2017 1057   BILITOT 0.3 03/22/2017 1057      Component Value Date/Time   TSH 3.190 03/22/2017 1057   TSH 4.000 01/25/2017 1109   TSH 3.70 02/24/2016 1158    ASSESSMENT AND PLAN: Essential hypertension  Class 1 obesity with serious comorbidity and body mass index (BMI) of 30.0 to 30.9 in adult, unspecified obesity type  PLAN:  Hypertension We discussed sodium restriction, working on healthy weight loss, and a regular exercise program as the means to achieve improved blood pressure control. Molly Giles agreed with this plan and agreed to follow up as directed. We will continue to monitor her blood pressure as well as her progress with the above lifestyle modifications. She will continue her medications as prescribed and will watch for signs of hypotension as she continues her lifestyle modifications. She was instructed to measure her blood pressure and follow  up with her cardiologist.  We spent > than 50% of the 15 minute visit on the counseling as documented in the note.   Obesity Molly Giles is currently in the action stage of change. As such, her goal is to continue with weight loss efforts She has agreed to keep a food journal with 400 calories and 35 grams of protein at lunch daily and follow the Category 2 plan Molly Giles has been  instructed to work up to a goal of 150 minutes of combined cardio and strengthening exercise per week for weight loss and overall health benefits. We discussed the following Behavioral Modification Strategies today: increasing lean protein intake and work on meal planning and easy cooking plans  Molly Giles has agreed to follow up with our clinic in 3 weeks. She was informed of the importance of frequent follow up visits to maximize her success with intensive lifestyle modifications for her multiple health conditions.  I, Doreene Nest, am acting as transcriptionist for Lacy Duverney, PA-C  I have reviewed the above documentation for accuracy and completeness, and I agree with the above. -Lacy Duverney, PA-C  I have reviewed the above note and agree with the plan. -Dennard Nip, MD   OBESITY BEHAVIORAL INTERVENTION VISIT  Today's visit was # 5 out of 22.  Starting weight: 204 lbs Starting date: 03/22/17 Today's weight : 189 lbs Today's date: 05/24/2017 Total lbs lost to date: 15 (Patients must lose 7 lbs in the first 6 months to continue with counseling)   ASK: We discussed the diagnosis of obesity with Molly Giles today and Molly Giles agreed to give [name] permission to discuss obesity behavioral modification therapy today.  ASSESS: Molly Giles has the diagnosis of obesity and her BMI today is 30.52 Molly Giles is in the action stage of change   ADVISE: Molly Giles was educated on the multiple health risks of obesity as well as the benefit of weight loss to improve her health. She was advised of the need for long term treatment and the importance of lifestyle modifications.  AGREE: Multiple dietary modification options and treatment options were discussed and  Molly Giles agreed to keep a food journal with 400 calories and 35 grams of protein  and follow the Category 2 plan We discussed the following Behavioral Modification Strategies today: increasing lean protein intake and work on meal planning and easy cooking  plans

## 2017-05-28 ENCOUNTER — Encounter: Payer: Self-pay | Admitting: Neurology

## 2017-05-28 ENCOUNTER — Other Ambulatory Visit: Payer: Self-pay | Admitting: Neurology

## 2017-05-28 ENCOUNTER — Telehealth: Payer: Self-pay | Admitting: Neurology

## 2017-05-28 ENCOUNTER — Ambulatory Visit: Payer: BLUE CROSS/BLUE SHIELD | Attending: Neurology | Admitting: Rehabilitative and Restorative Service Providers"

## 2017-05-28 VITALS — BP 128/93

## 2017-05-28 DIAGNOSIS — R42 Dizziness and giddiness: Secondary | ICD-10-CM | POA: Diagnosis present

## 2017-05-28 DIAGNOSIS — R269 Unspecified abnormalities of gait and mobility: Secondary | ICD-10-CM | POA: Insufficient documentation

## 2017-05-28 DIAGNOSIS — H8112 Benign paroxysmal vertigo, left ear: Secondary | ICD-10-CM | POA: Insufficient documentation

## 2017-05-28 NOTE — Therapy (Signed)
[facility] [address] [place], [place], [address] Phone: [phone]   Fax:  [phone]  Physical Therapy Evaluation  Patient Details  Name: [place] MRN: [id number] Date of Birth: 1956-07-10 Referring Provider: Heide Spark, MD  Encounter Date: 05/28/2017      PT End of Session - 05/28/17 1441    Visit Number 1   Number of Visits 8   Date for PT Re-Evaluation 06/27/17   Authorization Type private insurance   PT Start Time 1410   PT Stop Time 1450   PT Time Calculation (min) 40 min   Activity Tolerance Patient tolerated treatment well   Behavior During Therapy [facility] for tasks assessed/performed      Past Medical History:  Diagnosis Date  . Allergic rhinoconjunctivitis 07/02/2015  . Arthritis   . Benign positional vertigo 04/2015   Responded well to Vestibular Rehab  . Bruxism (teeth grinding)   . Chronic migraine 02/25/2016   Per Dr Jaynee Eagles review in notes from [facility] from September 2015. Showed total headache days last month 18. Severe headache days 7 days. Moderate headache days 5 days. Mild headache days last month sick days. Days without headache last month 10 days. Symptoms associated with photophobia, phonophobia, osmophobia, neck pain, dizziness, jaw pain, nasal congestion, vision disturbances, tingling and numbness, weakness and worsening with activity. Each headache attack last 3 hours depending on treatment in severity. Left side, the right side, easier side, the frontal area in the back of the head. Characterized as throbbing, pressure, tightness, squeezing, stabbing and burning   . Chronic migraine w/o aura w/o status migrainosus, not intractable 07/14/2015  . DDD (degenerative disc disease), lumbar   . Degenerative cervical disc   . Depression   . Dysrhythmia    seen by dr Wynonia Lawman- not a problem since she has been on Bystolic  . Essential hypertension 05/15/2013  . Family history  of adverse reaction to anesthesia    Brother- N/V  . Family history of premature CAD 05/15/2013  . Fibromyalgia   . GERD (gastroesophageal reflux disease) 12/16/2014  . H/O seasonal allergies   . Hearing loss of both ears 07/27/2015   mild to borderline moderate low frequency hearing loss improving to within normal limits bilaterally on audiology testing at [facility] in November 2016.    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 History of irritable bowel syndrome 02/15/2016  . History of migraine headaches 05/15/2013   Per Dr Jaynee Eagles review in notes from [facility] from September 2015. Showed total headache days last month 18. Severe headache days 7 days. Moderate headache days 5 days. Mild headache days last month sick days. Days without headache last month 10 days. Symptoms associated with photophobia, phonophobia, osmophobia, neck pain, dizziness, jaw pain, nasal congestion, vision disturbances, tingling and numbness, weakness and worsening with activity. Each headache attack last 3 hours depending on treatment in severity. Left side, the right side, easier side, the frontal area in the back of the head. Characterized as throbbing, pressure, tightness, squeezing, stabbing and burning    . Hx of bad fall 02/2015   Severe Facial/head trauma without fracture  . Hyperlipidemia 1998  . Hypertension 2004  . Hypothyroidism   . Impairment of balance 02/2015   Consequent of postconcussive syndrome  . Interstitial cystitis   . 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 Migraine 1962   Chronic Migraines  . Mood  disorder ([place])   . Musculoskeletal neck pain 07/14/2015  . Normal coronary arteries 05/14/2014  . Osteoarthritis of left hip 11/01/2015   MRI order by Dr Alvan Dame (ortho) 10/2015 showed significant arthritis of left hip joint with cystic changes in femoral head c/w osteoarthritis  . 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 Overweight   . Pain in joint of left shoulder 11/09/2016  . Palpitations 05/15/2013   [facility] manages  . Periodic limb movement sleep disorder 03/28/2017  . Perirectal cyst 05/07/2016  . PONV (postoperative nausea and vomiting)   . Post concussion syndrome 06/06/2015  . Post concussive syndrome 07/14/2015   [name]'s post-concussive syndrome manifesting in vertigo and headache, mood changes, poor balance, dizziness, and decreased concentration per Dr Jaynee Eagles at [facility].   Marland Kitchen Posterior vitreous detachment of right eye 2014  . Shortness of breath dyspnea    with exertion  . Thyroid nodule 08/11/2009   Findings: The thyroid gland is within normal limits in size.  The gland is diffusely inhomogeneous. A small solid nodule is noted in the lower pole  medially on the right of 7 x 6 x 8 mm. A small solid nodule is noted inferiorly on the left of 3 x 3 x 4 mm.  IMPRESSION:  The thyroid gland is within normal limits in size with only small solid nodules present, the largest of only 8 mm in diameter on the right.    . Vasomotor symptoms due to menopause 04/19/2017    Past Surgical History:  Procedure Laterality Date  . Bladder dilitation     x 3  . BREAST BIOPSY  2011   Benign histology  . CARDIOVASCULAR STRESS TEST  2000   Unremarkable per pt report  . CARPOMETACARPAL JOINT ARTHROTOMY Right 2011  . COLONOSCOPY    . COLONOSCOPY WITH PROPOFOL N/A 04/21/2015   Procedure: COLONOSCOPY WITH PROPOFOL;  Surgeon: Carol Ada, MD;  Location: [facility];  Service: Endoscopy;  Laterality: N/A;  . EPIDURAL BLOCK INJECTION Left 04/12/2016   Left Medial Nerve Block and Left L5 ramus block, Dr Suella Broad   . EPIDURAL BLOCK INJECTION  03/21/2016   Left L3-4 medial branch block and Left L5 & dorsal ramus block   . EPIDURAL BLOCK INJECTION N/A 10/25/2016   Suella Broad, MD. Lumbar medial branch block  . EPIDURAL BLOCK INJECTION N/A  02/09/2017   Suella Broad, MD.  Bilateral L3/4 medial branch block, bilateral L5 dorsal ramus block  . FECAL TRANSPLANT  04/21/2015   Procedure: FECAL TRANSPLANT;  Surgeon: Carol Ada, MD;  Location: [facility];  Service: Endoscopy;;  . INJECTION HIP INTRA ARTICULAR Left 11/2015   for OA by Dr Suella Broad  . OTHER SURGICAL HISTORY Left 2016   Left L3/L4 medial nerve block and Left L5 Dorsal Ramus block Dr Mickel Duhamel  . TOTAL HIP ARTHROPLASTY Left 07/25/2016   Procedure: LEFT TOTAL HIP ARTHROPLASTY ANTERIOR APPROACH;  Surgeon: Paralee Cancel, MD;  Location: [facility];  Service: Orthopedics;  Laterality: Left;    Vitals:   05/28/17 1438  BP: (!) 128/93         Subjective Assessment - 05/28/17 1412    Subjective The patient reports that she had a migraine Friday and noted dizziness worse with rolling to the left overnight on Friday.  She is having increased pain in the left side of her neck and suboccipital headache on the left side.  She notes in general having difficulty reading due  to blurriness.  Patient gets migraines 4-5 days/week.   Pertinent History known to our clinic from 11/2015 (last appt) for vertigo.   Patient Stated Goals Reduce vertigo            [facility] PT Assessment - 05/28/17 1415      Assessment   Medical Diagnosis vertigo   Referring Provider Heide Spark, MD   Onset Date/Surgical Date 05/25/17   Prior Therapy known to our clinic from prior vestibular rehabilitation     Precautions   Precautions Fall     Restrictions   Weight Bearing Restrictions No     Balance Screen   Has the patient fallen in the past 6 months No   Has the patient had a decrease in activity level because of a fear of falling?  No   Is the patient reluctant to leave their home because of a fear of falling?  No     Home Social worker Private residence   Living Arrangements Spouse/significant other     Prior Function   Level of Independence Independent    Vocation Part time employment     Observation/Other Assessments   Focus on Therapeutic Outcomes (FOTO)  51%   Other Surveys  Other Surveys   Dizziness Handicap Inventory [facility])  76%            Vestibular Assessment - 05/28/17 1417      Vestibular Assessment   General Observation Patient walked into therapy without device independently.     Symptom Behavior   Type of Dizziness Spinning   Frequency of Dizziness daily   Duration of Dizziness seconds   Aggravating Factors Rolling to left   Relieving Factors Head stationary     Occulomotor Exam   Occulomotor Alignment Normal   Spontaneous Absent   Gaze-induced Absent     Positional Testing   Dix-Hallpike Dix-Hallpike Right;Dix-Hallpike Left  general mild sensation of dizziness 2-3/10 with dix hallpike   Horizontal Canal Testing Horizontal Canal Right;Horizontal Canal Left     Dix-Hallpike Right   Dix-Hallpike Right Duration none   Dix-Hallpike Right Symptoms No nystagmus     Dix-Hallpike Left   Dix-Hallpike Left Duration none   Dix-Hallpike Left Symptoms No nystagmus     Horizontal Canal Right   Horizontal Canal Right Duration none   Horizontal Canal Right Symptoms Normal     Horizontal Canal Left   Horizontal Canal Left Duration 20 seconds with a 10 second latency   Horizontal Canal Left Symptoms Other (comment)  upbeating, L rotary (although in plane of testing for horz)        Objective measurements completed on examination: See above findings.           Vestibular Treatment/Exercise - 05/28/17 1426      Vestibular Treatment/Exercise   Vestibular Treatment Provided Canalith Repositioning   Canalith Repositioning Canal Roll Left   Gaze Exercises X1 Viewing Horizontal     Canal Roll Left   Number of Reps  2   Response Details  moving from left to back at end of each roll provokes a sensation of vertigo.      X1 Viewing Horizontal   Foot Position seated   Comments PT demonstrated and provided  handout with instruction to begin in 2 days.               PT Education - 05/28/17 2016    Education provided Yes   Education Details HEP: habituation and gaze adaptation to  begin in 2 days   Person(s) Educated Patient   Methods Explanation;Demonstration;Handout   Comprehension Verbalized understanding;Returned demonstration          PT Short Term Goals - 05/28/17 2016      PT SHORT TERM GOAL #1   Title STGs=LTGs           PT Long Term Goals - 05/28/17 1442      PT LONG TERM GOAL #1   Title The patient will return demo HEP for habituation, gaze.   Time 4   Period Weeks   Target Date 06/27/17     PT LONG TERM GOAL #2   Title The patient will have negative positional testing for nystagmus and dizziness.   Time 4   Period Weeks   Target Date 06/27/17     PT LONG TERM GOAL #3   Title The patient will verbalize understanding of future mgmt of BPPV prior to d/c.   Time 4   Period Weeks   Target Date 06/27/17     PT LONG TERM GOAL #4   Title The patient will return demo HEP for neck stabilization and stretching secondary to h/o chronic migraines with neck tension.   Time 4   Period Weeks   Target Date 06/27/17     PT LONG TERM GOAL #5   Title Improve DHI from 76% to < or equal to 50% to demo dec'd self perception of dizziness.   Time 4   Period Weeks   Target Date 06/27/17                Plan - 05/28/17 1429    Clinical Impression Statement The patient is a 61 year old female known to our clinic from prior treatment for concussion and vertigo.  She called in today due to sensation of vertigo over the weekend and difficulty tolerating movement.  The patient was found to have L BPPV, with upbeating, rotary nsytagmus (indicating L posterior canal).  Her symtpoms were provoked during horizontal roll testing, which may indicate that her canal orientation may vary from typical.   PT to treat and provide HEP for self management of recurring BPPV.     History and Personal Factors relevant to plan of care: migraine, concussion, h/o recurrent BPPV   Clinical Presentation Evolving   Clinical Presentation due to: recurrence of BPPV   Clinical Decision Making Low   Rehab Potential Good   PT Frequency 2x / week   PT Duration 4 weeks   PT Treatment/Interventions ADLs/Self Care Home Management;Therapeutic activities;Therapeutic exercise;Balance training;Neuromuscular re-education;Stair training;Gait training;Patient/family education;Canalith Repostioning;Vestibular;Manual techniques   Consulted and Agree with Plan of Care Patient      Patient will benefit from skilled therapeutic intervention in order to improve the following deficits and impairments:  Abnormal gait, Dizziness, Decreased balance, Pain, Impaired flexibility  Visit Diagnosis: Dizziness and giddiness  BPPV (benign paroxysmal positional vertigo), left     Problem List Patient Active Problem List   Diagnosis Date Noted  . Vitamin D deficiency 05/08/2017  . Class 1 obesity without serious comorbidity with body mass index (BMI) of 30.0 to 30.9 in adult 04/24/2017  . Vasomotor symptoms due to menopause 04/19/2017  . Periodic limb movement sleep disorder 03/28/2017  . Nocturnal hypoxemia 03/06/2017  . Morbid obesity ([place]) 03/06/2017  . Snoring 03/06/2017  . Gasping for breath 03/06/2017  . Sleep walking and eating 03/06/2017  . Episodic cluster headache, not intractable 03/06/2017  . Obesity (BMI 30.0-34.9) 01/29/2017  .  Other insomnia 11/09/2016  . S/P left THA, AA 07/25/2016  . Irritable bowel syndrome with diarrhea 04/03/2016  . Chronic migraine 02/25/2016  . Left ventricular hypertrophy, mild 02/25/2016  . History of irritable bowel syndrome 02/15/2016  . 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 Osteoarthritis of left hip 11/01/2015  . Hearing loss of both ears 07/27/2015  . Post concussive syndrome 07/14/2015  . Depression (NOS) 07/02/2015  .  Allergic rhinoconjunctivitis 07/02/2015  . Fibromyalgia syndrome 07/01/2015  . History of Clostridium difficile colitis, persistent 07/01/2015  . Impairment of balance 02/10/2015  . Essential hypertension 05/15/2013  . Hypothyroidism 05/15/2013  . Hyperlipidemia 05/15/2013  . Interstitial cystitis 05/15/2013  . Chronic migraine without aura 05/15/2013  . Spondylosis of lumbar region without myelopathy or radiculopathy 10/30/2011    Ameira Alessandrini, PT 05/28/2017, 8:21 PM  [facility] [address] [place], [place], [address] Phone: [phone]   Fax:  [phone]  Name: [place] MRN: [id number] Date of Birth: Jun 10, 1956

## 2017-05-28 NOTE — Telephone Encounter (Signed)
Ordered. thanks

## 2017-05-28 NOTE — Telephone Encounter (Signed)
Patient calling to get a referral to [facility] in our building for vertigo. She says has been seen there before. They will see her today if referral is put in.

## 2017-05-28 NOTE — Patient Instructions (Addendum)
Habituation - Tip Card  1.The goal of habituation training is to assist in decreasing symptoms of vertigo, dizziness, or nausea provoked by specific head and body motions. 2.These exercises may initially increase symptoms; however, be persistent and work through symptoms. With repetition and time, the exercises will assist in reducing or eliminating symptoms. 3.Exercises should be stopped and discussed with the therapist if you experience any of the following: - Sudden change or fluctuation in hearing - New onset of ringing in the ears, or increase in current intensity - Any fluid discharge from the ear - Severe pain in neck or back - Extreme nausea  Copyright  VHI. All rights reserved.   Habituation - Rolling   With pillow under head, start on back. Roll to your right side.  Hold until dizziness stops, plus 20 seconds and then roll to the left side.  Hold until dizziness stops, plus 20 seconds.  Repeat sequence 5 times per session. Do 2 sessions per day.  Copyright  VHI. All rights reserved.    Gaze Stabilization: Sitting    Keeping eyes on target on wall 3 feet away, and move head side to side for _30-60___ seconds. Repeat while moving head up and down for __30-60__ seconds. Do __2-3__ sessions per day.  Copyright  VHI. All rights reserved.   Gaze Stabilization: Tip Card  1.Target must remain in focus, not blurry, and appear stationary while head is in motion. 2.Perform exercises with small head movements (45 to either side of midline). 3.Increase speed of head motion so long as target is in focus. 4.If you wear eyeglasses, be sure you can see target through lens (therapist will give specific instructions for bifocal / progressive lenses). 5.These exercises may provoke dizziness or nausea. Work through these symptoms. If too dizzy, slow head movement slightly. Rest between each exercise. 6.Exercises demand concentration; avoid distractions.  Copyright  VHI. All rights  reserved.

## 2017-05-28 NOTE — Telephone Encounter (Signed)
Noted, thank you

## 2017-05-31 ENCOUNTER — Encounter: Payer: Self-pay | Admitting: Pharmacist

## 2017-05-31 ENCOUNTER — Ambulatory Visit: Payer: BLUE CROSS/BLUE SHIELD | Admitting: Pharmacist

## 2017-05-31 ENCOUNTER — Ambulatory Visit (INDEPENDENT_AMBULATORY_CARE_PROVIDER_SITE_OTHER): Payer: BLUE CROSS/BLUE SHIELD | Admitting: Pharmacist

## 2017-05-31 DIAGNOSIS — I10 Essential (primary) hypertension: Secondary | ICD-10-CM

## 2017-05-31 NOTE — Patient Instructions (Addendum)
Wearing the Blood Pressure Monitor  The cuff will inflate every 20 minutes during the day and every 30 minutes while you sleep.  Your blood pressure readings will NOT display after cuff inflation  Fill out the blood pressure-activity diary during the day, especially during activities that may affect your reading -- such as exercise, stress, walking, taking your blood pressure medications  Important things to know:  Avoid taking the monitor off for the next 24 hours, unless it causes you discomfort or pain.  Do NOT get the monitor wet and do NOT dry to clean the monitor with any cleaning products.  Do NOT drop the monitor.  Do NOT put the monitor on anyone else's arm.  When the cuff inflates, avoid excess movement. Let the cuffed arm hang loosely, slightly away from the body. Avoid flexing the muscles or moving the hand/fingers.  When you go to sleep, make sure that the hose is not kinked.  Remember to fill out the blood pressure activity diary.  If you experience severe pain or unusual pain (not associated with getting your blood pressure checked), remove the monitor.  Troubleshooting:  Code  Troubleshooting   1  Check cuff position, tighten cuff   2, 3  Remain still during reading   4, 87  Check air hose connections and make sure cuff is tight   85, 89  Check hose connections and make tubing is not crimped   86  Push START/STOP to restart reading   88, 91  Retry by pushing START/STOP   90  Replace batteries. If problem persists, remove monitor and bring back to   clinic at follow up   97, 98, 99  Service required - Remove monitor and bring back to clinic at follow up    Blood Pressure Activity Diary  Time Lying down/ Sleeping Walking/ Exercise Stressed/ Angry Headache/ Pain Dizzy  9 AM       10 AM       11 AM       12 PM       1 PM       2 PM       Time Lying down/ Sleeping Walking/ Exercise Stressed/ Angry Headache/ Pain Dizzy  3 PM       4 PM        5 PM       6  PM       7 PM       8 PM       Time Lying down/ Sleeping Walking/ Exercise Stressed/ Angry Headache/ Pain Dizzy  9 PM       10 PM       11 PM       12 AM       1 AM       2 AM       3 AM       Time Lying down/ Sleeping Walking/ Exercise Stressed/ Angry Headache/ Pain Dizzy  4 AM       5 AM       6 AM       7 AM       8 AM       9 AM       10 AM        Time you woke up: _________          Time you went to sleep:__________    Come back Monday (9/24) at 10:00 AM to  have the monitor removed  Call the [facility] if you have any questions before then [phone])

## 2017-05-31 NOTE — Progress Notes (Signed)
   S:    Patient arrives in fair spirits, ambulating without assitance. Presents to the clinic for ambulatory blood pressure evaluation at the request of Dr. McDiarmid.  Patient was referred on 05/10/17.  Patient was last seen by Primary Care Provider on 05/10/17.    Patient reports that her blood pressure with medication runs 120s/80s, but recently dropped to the 100s/60s. Candesartan was discontinued.   Medication compliance is reported to be adherent, but patient has been taken off all antihypertensives.  Discussed procedure for wearing the monitor and gave patient written instructions. Monitor was placed on non-dominant arm with instructions to return in the morning.   Current BP Medications include:  None Currently  Antihypertensives tried in the past include: Candesartan, Amlodipine, Propranolol  Patient returned to the clinic and reported the device was pumping up to a very high level and her arm was unable to tolerate > 20 hours of wearing the device.   Patient denied dropping or having the monitor get wet or damaged in any way.      O:   Last 3 Office BP readings: BP Readings from Last 3 Encounters:  05/28/17 (!) 128/93  05/24/17 102/72  05/10/17 118/80     Today's Office BP reading: 122/83 mmHg on 06/04/2017 in office  ABPM Study Data: Arm Placement left arm  Results UNABLE to be determined initially due to equipment failure.  Contacted [employer] for assistance.    BMET    Component Value Date/Time   NA 142 03/22/2017 1057   K 4.8 03/22/2017 1057   CL 103 03/22/2017 1057   CO2 25 03/22/2017 1057   GLUCOSE 76 03/22/2017 1057   GLUCOSE 132 (H) 07/26/2016 0419   BUN 7 (L) 03/22/2017 1057   CREATININE 0.79 03/22/2017 1057   CREATININE 0.74 02/24/2016 1158   CALCIUM 9.4 03/22/2017 1057   GFRNONAA 81 03/22/2017 1057   GFRNONAA 88 02/24/2016 1158   GFRAA 93 03/22/2017 1057   GFRAA >89 02/24/2016 1158    A/P: History of hypertension appears to be improved  following recent weight loss - appears to be controlled at goal level BP based on in-office and home reading recently.   Unfortunately, the Amb BP monitor was returned in a dysfunctional state.  [employer] support was asked to provide support.  Given in-office reading today, no medication changes were implemented.  I allowed patient to leave with instructions I would call her if I could recover the data from the BP monitor. I called her at 4:50 PM and left a message that as of the end of business I was unable to have any help with he test.       Results reviewed and written information provided.  Total time in face-to-face counseling 20 minutes.   F/U Clinic Visit with Dr. McDiarmid.  Patient seen with Cleotis Lema, PharmD Candidate.

## 2017-06-04 ENCOUNTER — Other Ambulatory Visit (INDEPENDENT_AMBULATORY_CARE_PROVIDER_SITE_OTHER): Payer: Self-pay | Admitting: Family Medicine

## 2017-06-04 ENCOUNTER — Encounter: Payer: Self-pay | Admitting: Pharmacist

## 2017-06-04 ENCOUNTER — Ambulatory Visit: Payer: BLUE CROSS/BLUE SHIELD | Admitting: Rehabilitative and Restorative Service Providers"

## 2017-06-04 DIAGNOSIS — H8112 Benign paroxysmal vertigo, left ear: Secondary | ICD-10-CM

## 2017-06-04 DIAGNOSIS — R42 Dizziness and giddiness: Secondary | ICD-10-CM

## 2017-06-04 DIAGNOSIS — R269 Unspecified abnormalities of gait and mobility: Secondary | ICD-10-CM

## 2017-06-04 DIAGNOSIS — E559 Vitamin D deficiency, unspecified: Secondary | ICD-10-CM

## 2017-06-04 NOTE — Assessment & Plan Note (Signed)
History of hypertension appears to be improved following recent weight loss - appears to be controlled at goal level BP based on in-office and home reading recently.   Unfortunately, the Amb BP monitor was returned in a dysfunctional state.  [employer] support was asked to provide support.  Given in-office reading today, no medication changes were implemented.  I allowed patient to leave with instructions I would call her if I could recover the data from the BP monitor. I called her at 4:50 PM and left a message that as of the end of business I was unable to have any help with he test.

## 2017-06-04 NOTE — Therapy (Signed)
[facility] [address] [place], [place], [address] Phone: [phone]   Fax:  [phone]  Physical Therapy Treatment  Patient Details  Name: Molly Giles MRN: [id number] Date of Birth: August 30, 1956 Referring Provider: Heide Spark, MD  Encounter Date: 06/04/2017      PT End of Session - 06/04/17 1218    Visit Number 2   Number of Visits 8   Date for PT Re-Evaluation 06/27/17   Authorization Type private insurance   PT Start Time 1108   PT Stop Time 1154   PT Time Calculation (min) 46 min   Activity Tolerance Patient tolerated treatment well   Behavior During Therapy [facility] for tasks assessed/performed      Past Medical History:  Diagnosis Date  . Allergic rhinoconjunctivitis 07/02/2015  . Arthritis   . Benign positional vertigo 04/2015   Responded well to Vestibular Rehab  . Bruxism (teeth grinding)   . Chronic migraine 02/25/2016   Per Dr Jaynee Eagles review in notes from [facility] from September 2015. Showed total headache days last month 18. Severe headache days 7 days. Moderate headache days 5 days. Mild headache days last month sick days. Days without headache last month 10 days. Symptoms associated with photophobia, phonophobia, osmophobia, neck pain, dizziness, jaw pain, nasal congestion, vision disturbances, tingling and numbness, weakness and worsening with activity. Each headache attack last 3 hours depending on treatment in severity. Left side, the right side, easier side, the frontal area in the back of the head. Characterized as throbbing, pressure, tightness, squeezing, stabbing and burning   . Chronic migraine w/o aura w/o status migrainosus, not intractable 07/14/2015  . DDD (degenerative disc disease), lumbar   . Degenerative cervical disc   . Depression   . Dysrhythmia    seen by dr Wynonia Lawman- not a problem since she has been on Bystolic  . Essential hypertension 05/15/2013  . Family history  of adverse reaction to anesthesia    Brother- N/V  . Family history of premature CAD 05/15/2013  . Fibromyalgia   . GERD (gastroesophageal reflux disease) 12/16/2014  . H/O seasonal allergies   . Hearing loss of both ears 07/27/2015   mild to borderline moderate low frequency hearing loss improving to within normal limits bilaterally on audiology testing at [facility] in November 2016.    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 History of irritable bowel syndrome 02/15/2016  . History of migraine headaches 05/15/2013   Per Dr Jaynee Eagles review in notes from [facility] from September 2015. Showed total headache days last month 18. Severe headache days 7 days. Moderate headache days 5 days. Mild headache days last month sick days. Days without headache last month 10 days. Symptoms associated with photophobia, phonophobia, osmophobia, neck pain, dizziness, jaw pain, nasal congestion, vision disturbances, tingling and numbness, weakness and worsening with activity. Each headache attack last 3 hours depending on treatment in severity. Left side, the right side, easier side, the frontal area in the back of the head. Characterized as throbbing, pressure, tightness, squeezing, stabbing and burning    . Hx of bad fall 02/2015   Severe Facial/head trauma without fracture  . Hyperlipidemia 1998  . Hypertension 2004  . Hypothyroidism   . Impairment of balance 02/2015   Consequent of postconcussive syndrome  . Interstitial cystitis   . 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 Migraine 1962   Chronic Migraines  . Mood  disorder ([place])   . Musculoskeletal neck pain 07/14/2015  . Normal coronary arteries 05/14/2014  . Osteoarthritis of left hip 11/01/2015   MRI order by Dr Alvan Dame (ortho) 10/2015 showed significant arthritis of left hip joint with cystic changes in femoral head c/w osteoarthritis  . 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 Overweight   . Pain in joint of left shoulder 11/09/2016  . Palpitations 05/15/2013   [facility] manages  . Periodic limb movement sleep disorder 03/28/2017  . Perirectal cyst 05/07/2016  . PONV (postoperative nausea and vomiting)   . Post concussion syndrome 06/06/2015  . Post concussive syndrome 07/14/2015   Ms Ashmead's post-concussive syndrome manifesting in vertigo and headache, mood changes, poor balance, dizziness, and decreased concentration per Dr Jaynee Eagles at [facility].   Marland Kitchen Posterior vitreous detachment of right eye 2014  . Shortness of breath dyspnea    with exertion  . Thyroid nodule 08/11/2009   Findings: The thyroid gland is within normal limits in size.  The gland is diffusely inhomogeneous. A small solid nodule is noted in the lower pole  medially on the right of 7 x 6 x 8 mm. A small solid nodule is noted inferiorly on the left of 3 x 3 x 4 mm.  IMPRESSION:  The thyroid gland is within normal limits in size with only small solid nodules present, the largest of only 8 mm in diameter on the right.    . Vasomotor symptoms due to menopause 04/19/2017    Past Surgical History:  Procedure Laterality Date  . Bladder dilitation     x 3  . BREAST BIOPSY  2011   Benign histology  . CARDIOVASCULAR STRESS TEST  2000   Unremarkable per pt report  . CARPOMETACARPAL JOINT ARTHROTOMY Right 2011  . COLONOSCOPY    . COLONOSCOPY WITH PROPOFOL N/A 04/21/2015   Procedure: COLONOSCOPY WITH PROPOFOL;  Surgeon: Carol Ada, MD;  Location: [facility];  Service: Endoscopy;  Laterality: N/A;  . EPIDURAL BLOCK INJECTION Left 04/12/2016   Left Medial Nerve Block and Left L5 ramus block, Dr Suella Broad   . EPIDURAL BLOCK INJECTION  03/21/2016   Left L3-4 medial branch block and Left L5 & dorsal ramus block   . EPIDURAL BLOCK INJECTION N/A 10/25/2016   Suella Broad, MD. Lumbar medial branch block  . EPIDURAL BLOCK INJECTION N/A  02/09/2017   Suella Broad, MD.  Bilateral L3/4 medial branch block, bilateral L5 dorsal ramus block  . FECAL TRANSPLANT  04/21/2015   Procedure: FECAL TRANSPLANT;  Surgeon: Carol Ada, MD;  Location: [facility];  Service: Endoscopy;;  . INJECTION HIP INTRA ARTICULAR Left 11/2015   for OA by Dr Suella Broad  . OTHER SURGICAL HISTORY Left 2016   Left L3/L4 medial nerve block and Left L5 Dorsal Ramus block Dr Mickel Duhamel  . TOTAL HIP ARTHROPLASTY Left 07/25/2016   Procedure: LEFT TOTAL HIP ARTHROPLASTY ANTERIOR APPROACH;  Surgeon: Paralee Cancel, MD;  Location: [facility];  Service: Orthopedics;  Laterality: Left;    There were no vitals filed for this visit.      Subjective Assessment - 06/04/17 1110    Subjective Dizziness is worse with rolling in the bed and when she first wakes.  This sensation eases during the day and she doesn't want to do exercises a second time each day.  She notes her headache has returned and she took meds for migraine today.  She has returned to driving, but notes  any head movement provokes symtpoms.    Pertinent History known to our clinic from 11/2015 (last appt) for vertigo.   Patient Stated Goals Reduce vertigo   Currently in Pain? Yes   Pain Score 7    Pain Location Head   Pain Descriptors / Indicators Headache   Pain Type Acute pain   Pain Onset More than a month ago   Pain Frequency Intermittent   Aggravating Factors  headaches vary   Pain Relieving Factors meds                Vestibular Assessment - 06/04/17 1115      Vestibular Assessment   General Observation Baseline "head nausea" worse with head turns up and to the left side elicits spinning.   The patient notes feeling pulled to the right side.      Symptom Behavior   Type of Dizziness Spinning  baseline "head nausea"   Frequency of Dizziness daily   Duration of Dizziness seconds   Aggravating Factors Rolling to left   Relieving Factors Head stationary     Positional Testing    Dix-Hallpike Dix-Hallpike Right;Dix-Hallpike Left   Horizontal Canal Testing Horizontal Canal Right;Horizontal Canal Left     Dix-Hallpike Right   Dix-Hallpike Right Duration none   Dix-Hallpike Right Symptoms No nystagmus     Dix-Hallpike Left   Dix-Hallpike Left Duration none   Dix-Hallpike Left Symptoms No nystagmus     Horizontal Canal Right   Horizontal Canal Right Duration *initial test none   Horizontal Canal Right Symptoms Normal     Horizontal Canal Left   Horizontal Canal Left Duration 30 second duration   Horizontal Canal Left Symptoms --  L rotary *not typical for plane of testing                  Vestibular Treatment/Exercise - 06/04/17 0001      Vestibular Treatment/Exercise   Vestibular Treatment Provided Canalith Repositioning;Habituation;Gaze   Canalith Repositioning Canal Roll Right   Habituation Exercises Horizontal Roll;Comment  sit<>bilateral sidelying     Canal Roll Right   Number of Reps  1   Response Details  *performed R horizontal roll due to response with habituation*see below     Horizontal Roll   Number of Reps  4   Symptom Description  Patient initially had symptoms on the left side, however on 2nd rep, noted symptoms worse to the right.  Hard to differentiate direction, but a rotary nsytagmus that appears downbeat, left?  is viewed in room light with patient's report of nystagmus consistent with that direction (rooms is moving down and rotating).   SIT<>BILATERAL SIDELYING also perfomred but modified to be different from brandt daroff keeping head fixated forward versus 45 degree rotation.  Discussed ways to modify if exercises provoke migraines.               PT Education - 06/04/17 1217    Education provided Yes   Education Details HEP: added sit>left sidelying (head in neutral), sit>R sidelying (head in neutral) for HEP. Recommended patient perform less reps if exercises provoke migraines   Person(s) Educated Patient    Methods Explanation;Demonstration;Handout   Comprehension Verbalized understanding;Returned demonstration          PT Short Term Goals - 05/28/17 2016      PT SHORT TERM GOAL #1   Title STGs=LTGs           PT Long Term Goals - 05/28/17 1442  PT LONG TERM GOAL #1   Title The patient will return demo HEP for habituation, gaze.   Time 4   Period Weeks   Target Date 06/27/17     PT LONG TERM GOAL #2   Title The patient will have negative positional testing for nystagmus and dizziness.   Time 4   Period Weeks   Target Date 06/27/17     PT LONG TERM GOAL #3   Title The patient will verbalize understanding of future mgmt of BPPV prior to d/c.   Time 4   Period Weeks   Target Date 06/27/17     PT LONG TERM GOAL #4   Title The patient will return demo HEP for neck stabilization and stretching secondary to h/o chronic migraines with neck tension.   Time 4   Period Weeks   Target Date 06/27/17     PT LONG TERM GOAL #5   Title Improve DHI from 76% to < or equal to 50% to demo dec'd self perception of dizziness.   Time 4   Period Weeks   Target Date 06/27/17               Plan - 06/04/17 1221    Clinical Impression Statement The patient's symptoms vary during our treatment today.  Initially, symptoms more severe with L rolling and were upbeating, left rotary.  With repetition/habituation of rolling R<>L, symptoms improved to L after 3-4 reps just noting a mild sense of movement "in my head" from patient.  To the right, the patient initially did not have symptoms, but on 2-4 reps of rolling had a downbeat, L rotary nystagmus (? L anterior canal?) that also improved with repetition during reps 2-4.  PT educated with habituation after treating with R canolith roll using vibration L side.  This clinical presentation of nystagmus is atypical, however I do not feel this can all be attributed to migraines alone as she does not usually have a spinning sensation (until after  concussion with BPPV sustained years ago).  PT to follow and modify plan as needed.    PT Treatment/Interventions ADLs/Self Care Home Management;Therapeutic activities;Therapeutic exercise;Balance training;Neuromuscular re-education;Stair training;Gait training;Patient/family education;Canalith Repostioning;Vestibular;Manual techniques   PT Next Visit Plan Check for BPPV, habituation, habituation during functional tasks and standing balance.   Consulted and Agree with Plan of Care Patient      Patient will benefit from skilled therapeutic intervention in order to improve the following deficits and impairments:  Abnormal gait, Dizziness, Decreased balance, Pain, Impaired flexibility  Visit Diagnosis: Dizziness and giddiness  BPPV (benign paroxysmal positional vertigo), left  Abnormality of gait     Problem List Patient Active Problem List   Diagnosis Date Noted  . Vitamin D deficiency 05/08/2017  . Class 1 obesity without serious comorbidity with body mass index (BMI) of 30.0 to 30.9 in adult 04/24/2017  . Vasomotor symptoms due to menopause 04/19/2017  . Periodic limb movement sleep disorder 03/28/2017  . Nocturnal hypoxemia 03/06/2017  . Morbid obesity ([place]) 03/06/2017  . Snoring 03/06/2017  . Gasping for breath 03/06/2017  . Sleep walking and eating 03/06/2017  . Episodic cluster headache, not intractable 03/06/2017  . Obesity (BMI 30.0-34.9) 01/29/2017  . Other insomnia 11/09/2016  . S/P left THA, AA 07/25/2016  . Irritable bowel syndrome with diarrhea 04/03/2016  . Chronic migraine 02/25/2016  . Left ventricular hypertrophy, mild 02/25/2016  . History of irritable bowel syndrome 02/15/2016  . 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  Osteoarthritis of left hip 11/01/2015  . Hearing loss of both ears 07/27/2015  . Post concussive syndrome 07/14/2015  . Depression (NOS) 07/02/2015  . Allergic rhinoconjunctivitis 07/02/2015  . Fibromyalgia syndrome 07/01/2015   . History of Clostridium difficile colitis, persistent 07/01/2015  . Impairment of balance 02/10/2015  . Essential hypertension 05/15/2013  . Hypothyroidism 05/15/2013  . Hyperlipidemia 05/15/2013  . Interstitial cystitis 05/15/2013  . Chronic migraine without aura 05/15/2013  . Spondylosis of lumbar region without myelopathy or radiculopathy 10/30/2011    Kriste Broman, PT 06/04/2017, 12:26 PM  [facility] [address] [place], [place], [address] Phone: [phone]   Fax:  [phone]  Name: Molly Giles MRN: [id number] Date of Birth: February 13, 1956

## 2017-06-04 NOTE — Progress Notes (Signed)
Patient ID: Molly Giles, female   DOB: [birth year], 61 y.o.   MRN: [id number] Reviewed: Agree with Dr. Graylin Shiver documentation and management.

## 2017-06-04 NOTE — Patient Instructions (Signed)
Habituation - Tip Card  1.The goal of habituation training is to assist in decreasing symptoms of vertigo, dizziness, or nausea provoked by specific head and body motions. 2.These exercises may initially increase symptoms; however, be persistent and work through symptoms. With repetition and time, the exercises will assist in reducing or eliminating symptoms. 3.Exercises should be stopped and discussed with the therapist if you experience any of the following: - Sudden change or fluctuation in hearing - New onset of ringing in the ears, or increase in current intensity - Any fluid discharge from the ear - Severe pain in neck or back - Extreme nausea  Copyright  VHI. All rights reserved.   Habituation - Rolling   With pillow under head, start on back. Roll to your right side.  Hold until dizziness stops, plus 20 seconds and then roll to the left side.  Hold until dizziness stops, plus 20 seconds.  Repeat sequence 5 times per session. Do 2 sessions per day.  Copyright  VHI. All rights reserved.   Sit to Side-Lying    Sit on edge of bed.  KEEP YOUR HEAD STRAIGHT so when you lie down to the left side your nose is pointing straight ahead and your left ear is on the pillow.  Hold until dizziness stops plus 20 seconds.  Return to sitting and allow symptoms to settle.  Repeat to the right side and hold until dizziness stops, plus 20 seconds. Repeat 5 times, 2 times/day.  Copyright  VHI. All rights reserved.     Gaze Stabilization: Sitting    Keeping eyes on target on wall 3 feet away, and move head side to side for _30-60___ seconds. Repeat while moving head up and down for __30-60__ seconds. Do __2-3__ sessions per day.  Copyright  VHI. All rights reserved.   Gaze Stabilization: Tip Card  1.Target must remain in focus, not blurry, and appear stationary while head is in motion. 2.Perform exercises with small head movements (45 to either side of midline). 3.Increase speed of  head motion so long as target is in focus. 4.If you wear eyeglasses, be sure you can see target through lens (therapist will give specific instructions for bifocal / progressive lenses). 5.These exercises may provoke dizziness or nausea. Work through these symptoms. If too dizzy, slow head movement slightly. Rest between each exercise. 6.Exercises demand concentration; avoid distractions.  Copyright  VHI. All rights reserved.   Special Instructions: Exercises may bring on mild to moderate symptoms of dizziness and nausea that should resolve within 30 minutes of completing exercises.   If symptoms are lasting longer than 30 minutes OR BRINGING ON MIGRAINES, modify your exercises by:  >decreasing the # of times you complete each activity >ensuring your symptoms return to baseline before moving onto the next exercise >dividing up exercises so you do not do them all in one session, but multiple short sessions throughout the day >doing them once a day until symptoms improve

## 2017-06-07 ENCOUNTER — Telehealth: Payer: Self-pay | Admitting: Neurology

## 2017-06-07 NOTE — Telephone Encounter (Signed)
Please discuss with the infusion lab and try to get patient in today for migraine cocktail. Let m eknow if they can or cannot do it

## 2017-06-07 NOTE — Telephone Encounter (Signed)
I spoke to pt and she has had migraine since Sunday and she noted that she did have migraine last week as well.  She has taken her relpax 6 days out of 10, which has not helped.  Having vertigo as well. Level 8-9.  Nausea, sound/lights are bothersome.  Has taken motrin 800mg  which has not helped.  She would like to come in for migraine cocktail today, her husband will be home in about 30 minutes.  Please advise.  Had Botox 05-10-17.  She has not had the cocktail previously.

## 2017-06-07 NOTE — Telephone Encounter (Signed)
Pt called in she's had a migraine since Sunday and is wanting to come in for a migraine cocktail today.  Call transferred to RN

## 2017-06-07 NOTE — Telephone Encounter (Signed)
Spoke to pt and and she can come in for migraine cocktail at 1300 , with husband as driver.   Dr. Jaynee Eagles signed order: Depacon 1 gram IV, solumedrol 250mg  IV in 536ml NS, 10mg  compazine IV, toradol 30mg  IV.  Tina with intrafusion informed.

## 2017-06-11 ENCOUNTER — Ambulatory Visit: Payer: BLUE CROSS/BLUE SHIELD | Attending: Neurology | Admitting: Rehabilitative and Restorative Service Providers"

## 2017-06-11 DIAGNOSIS — R269 Unspecified abnormalities of gait and mobility: Secondary | ICD-10-CM | POA: Insufficient documentation

## 2017-06-11 DIAGNOSIS — H8112 Benign paroxysmal vertigo, left ear: Secondary | ICD-10-CM | POA: Insufficient documentation

## 2017-06-11 DIAGNOSIS — R42 Dizziness and giddiness: Secondary | ICD-10-CM | POA: Diagnosis present

## 2017-06-11 NOTE — Patient Instructions (Signed)
Habituation - Tip Card  1.The goal of habituation training is to assist in decreasing symptoms of vertigo, dizziness, or nausea provoked by specific head and body motions. 2.These exercises may initially increase symptoms; however, be persistent and work through symptoms. With repetition and time, the exercises will assist in reducing or eliminating symptoms. 3.Exercises should be stopped and discussed with the therapist if you experience any of the following: - Sudden change or fluctuation in hearing - New onset of ringing in the ears, or increase in current intensity - Any fluid discharge from the ear - Severe pain in neck or back - Extreme nausea  Copyright  VHI. All rights reserved.  Habituation - Rolling   With pillow under head, start on back. Roll to your right side. Hold until dizziness stops, plus 20 seconds and then roll to the left side. Hold until dizziness stops, plus 20 seconds. Repeat sequence 5 times per session. Do 2 sessions per day.  Copyright  VHI. All rights reserved.  Gaze Stabilization: Sitting    Keeping eyes on target on wall 3 feet away, and move head side to side for _30-60___ seconds. Repeat while moving head up and down for __30-60__ seconds. Do __2-3__ sessions per day.  Copyright  VHI. All rights reserved.  Gaze Stabilization: Tip Card  1.Target must remain in focus, not blurry, and appear stationary while head is in motion. 2.Perform exercises with small head movements (45 to either side of midline). 3.Increase speed of head motion so long as target is in focus. 4.If you wear eyeglasses, be sure you can see target through lens (therapist will give specific instructions for bifocal / progressive lenses). 5.These exercises may provoke dizziness or nausea. Work through these symptoms. If too dizzy, slow head movement slightly. Rest between each exercise. 6.Exercises demand concentration; avoid distractions.  Copyright  VHI. All rights  reserved.  Special Instructions: Exercises may bring on mild to moderate symptoms of dizziness and nausea that should resolve within 30 minutes of completing exercises.   If symptoms are lasting longer than 30 minutes OR BRINGING ON MIGRAINES, modify your exercises by:       >decreasing the # of times you complete each activity >ensuring your symptoms return to baseline before moving onto the next exercise >dividing up exercises so you do not do them all in one session, but multiple short sessions throughout the day >doing them once a day until symptoms improve  Feet Together (Compliant Surface) Varied Arm Positions - Eyes Closed    Stand on compliant surface: __pillow______ with feet together and arms at your side. Close eyes and visualize upright position. Hold__30__ seconds. Repeat _3___ times per session. Do _1-2___ sessions per day. *if you can't tolerate 30 seconds, begin with 10 seconds and work up to 30 seconds.  Copyright  VHI. All rights reserved.  Thoracic Self-Mobilization (Supine)    With rolled towel placed lengthwise at lower ribs level, lie back on towel with arms outstretched. Hold __2 minutes. Relax. Repeat _1___ times per set. Do __1-2__ sessions per day or as needed.  http://orth.exer.us/1001   Copyright  VHI. All rights reserved.    Look towards your left shoulder and gently walk the right hand out to the side. Hold for 20 seconds. Repeat the other direction 3 times or as needed.   Extensors, Supine    Lie supine, head on small, rolled towel or one pillow.  Gently tuck chin and bring toward chest. Hold __5_ seconds. Repeat __10_ times per session. Do  _2__ sessions per day.  Copyright  VHI. All rights reserved.     Scapular Retraction: Abduction (Standing)    With arms elevated and elbows bent to 90, pinch shoulder blades together and press arms back. Repeat _10-15___ times per set. Do __2__ sets per session. Do _2___ sessions per  day.  http://orth.exer.us/951   Copyright  VHI. All rights reserved.  Pectoral Stretch, Standing    ONE SIDE AT A TIME. Stand in doorframe with palms against frame and arms at 90. Step through doorway allowing arms to straighten. Hold 30 ___ seconds. Repeat _3__ times per session. Do __2_ sessions per day.  Copyright  VHI. All rights reserved.

## 2017-06-11 NOTE — Therapy (Signed)
[facility] [address] [place], [place], [address] Phone: [phone]   Fax:  [phone]  Physical Therapy Treatment  Patient Details  Name: Molly Giles MRN: [id number] Date of Birth: [birth year] Referring Provider: Heide Spark, MD  Encounter Date: 06/11/2017      PT End of Session - 06/11/17 1115    Visit Number 3   Number of Visits 8   Date for PT Re-Evaluation 06/27/17   Authorization Type private insurance   PT Start Time 1115   PT Stop Time 1155   PT Time Calculation (min) 40 min   Activity Tolerance Patient tolerated treatment well   Behavior During Therapy [facility] for tasks assessed/performed      Past Medical History:  Diagnosis Date  . Allergic rhinoconjunctivitis 07/02/2015  . Arthritis   . Benign positional vertigo 04/2015   Responded well to Vestibular Rehab  . Bruxism (teeth grinding)   . Chronic migraine 02/25/2016   Per Dr Jaynee Eagles review in notes from [facility] from September 2015. Showed total headache days last month 18. Severe headache days 7 days. Moderate headache days 5 days. Mild headache days last month sick days. Days without headache last month 10 days. Symptoms associated with photophobia, phonophobia, osmophobia, neck pain, dizziness, jaw pain, nasal congestion, vision disturbances, tingling and numbness, weakness and worsening with activity. Each headache attack last 3 hours depending on treatment in severity. Left side, the right side, easier side, the frontal area in the back of the head. Characterized as throbbing, pressure, tightness, squeezing, stabbing and burning   . Chronic migraine w/o aura w/o status migrainosus, not intractable 07/14/2015  . DDD (degenerative disc disease), lumbar   . Degenerative cervical disc   . Depression   . Dysrhythmia    seen by dr Wynonia Lawman- not a problem since she has been on Bystolic  . Essential hypertension 05/15/2013  . Family history  of adverse reaction to anesthesia    Brother- N/V  . Family history of premature CAD 05/15/2013  . Fibromyalgia   . GERD (gastroesophageal reflux disease) 12/16/2014  . H/O seasonal allergies   . Hearing loss of both ears 07/27/2015   mild to borderline moderate low frequency hearing loss improving to within normal limits bilaterally on audiology testing at [facility] in November 2016.    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 History of irritable bowel syndrome 02/15/2016  . History of migraine headaches 05/15/2013   Per Dr Jaynee Eagles review in notes from [facility] from September 2015. Showed total headache days last month 18. Severe headache days 7 days. Moderate headache days 5 days. Mild headache days last month sick days. Days without headache last month 10 days. Symptoms associated with photophobia, phonophobia, osmophobia, neck pain, dizziness, jaw pain, nasal congestion, vision disturbances, tingling and numbness, weakness and worsening with activity. Each headache attack last 3 hours depending on treatment in severity. Left side, the right side, easier side, the frontal area in the back of the head. Characterized as throbbing, pressure, tightness, squeezing, stabbing and burning    . Hx of bad fall 02/2015   Severe Facial/head trauma without fracture  . Hyperlipidemia 1998  . Hypertension 2004  . Hypothyroidism   . Impairment of balance 02/2015   Consequent of postconcussive syndrome  . Interstitial cystitis   . 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 Migraine 1962   Chronic Migraines  . Mood  disorder ([place])   . Musculoskeletal neck pain 07/14/2015  . Normal coronary arteries 05/14/2014  . Osteoarthritis of left hip 11/01/2015   MRI order by Dr Alvan Dame (ortho) 10/2015 showed significant arthritis of left hip joint with cystic changes in femoral head c/w osteoarthritis  . 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 Overweight   . Pain in joint of left shoulder 11/09/2016  . Palpitations 05/15/2013   [facility] manages  . Periodic limb movement sleep disorder 03/28/2017  . Perirectal cyst 05/07/2016  . PONV (postoperative nausea and vomiting)   . Post concussion syndrome 06/06/2015  . Post concussive syndrome 07/14/2015   Ms Marcell's post-concussive syndrome manifesting in vertigo and headache, mood changes, poor balance, dizziness, and decreased concentration per Dr Jaynee Eagles at [facility].   Marland Kitchen Posterior vitreous detachment of right eye 2014  . Shortness of breath dyspnea    with exertion  . Thyroid nodule 08/11/2009   Findings: The thyroid gland is within normal limits in size.  The gland is diffusely inhomogeneous. A small solid nodule is noted in the lower pole  medially on the right of 7 x 6 x 8 mm. A small solid nodule is noted inferiorly on the left of 3 x 3 x 4 mm.  IMPRESSION:  The thyroid gland is within normal limits in size with only small solid nodules present, the largest of only 8 mm in diameter on the right.    . Vasomotor symptoms due to menopause 04/19/2017    Past Surgical History:  Procedure Laterality Date  . Bladder dilitation     x 3  . BREAST BIOPSY  2011   Benign histology  . CARDIOVASCULAR STRESS TEST  2000   Unremarkable per pt report  . CARPOMETACARPAL JOINT ARTHROTOMY Right 2011  . COLONOSCOPY    . COLONOSCOPY WITH PROPOFOL N/A 04/21/2015   Procedure: COLONOSCOPY WITH PROPOFOL;  Surgeon: Carol Ada, MD;  Location: [facility];  Service: Endoscopy;  Laterality: N/A;  . EPIDURAL BLOCK INJECTION Left 04/12/2016   Left Medial Nerve Block and Left L5 ramus block, Dr Suella Broad   . EPIDURAL BLOCK INJECTION  03/21/2016   Left L3-4 medial branch block and Left L5 & dorsal ramus block   . EPIDURAL BLOCK INJECTION N/A 10/25/2016   Suella Broad, MD. Lumbar medial branch block  . EPIDURAL BLOCK INJECTION N/A  02/09/2017   Suella Broad, MD.  Bilateral L3/4 medial branch block, bilateral L5 dorsal ramus block  . FECAL TRANSPLANT  04/21/2015   Procedure: FECAL TRANSPLANT;  Surgeon: Carol Ada, MD;  Location: [facility];  Service: Endoscopy;;  . INJECTION HIP INTRA ARTICULAR Left 11/2015   for OA by Dr Suella Broad  . OTHER SURGICAL HISTORY Left 2016   Left L3/L4 medial nerve block and Left L5 Dorsal Ramus block Dr Mickel Duhamel  . TOTAL HIP ARTHROPLASTY Left 07/25/2016   Procedure: LEFT TOTAL HIP ARTHROPLASTY ANTERIOR APPROACH;  Surgeon: Paralee Cancel, MD;  Location: [facility];  Service: Orthopedics;  Laterality: Left;    There were no vitals filed for this visit.      Subjective Assessment - 06/11/17 1112    Subjective The patient reports that if she turns her head to the left, dizziness still comes on.  She notes she has to let it settle before walking.  On Thursday, she got migraine cocktail due to HA Sunday-Thursday.   Patient Stated Goals Reduce vertigo  Vestibular Assessment - 06/11/17 1116      Vestibular Assessment   General Observation No baseline dizziness, "but it's so close"     Symptom Behavior   Type of Dizziness Spinning   Frequency of Dizziness daily   Duration of Dizziness seconds   Aggravating Factors --  head movement to the left   Relieving Factors Head stationary     Vestibulo-Occular Reflex   Comment head impulse testing= patient has slowed refixation saccade to right     Positional Testing   Dix-Hallpike Dix-Hallpike Right;Dix-Hallpike Left   Horizontal Canal Testing Horizontal Canal Right;Horizontal Canal Left     Dix-Hallpike Right   Dix-Hallpike Right Duration none   Dix-Hallpike Right Symptoms No nystagmus     Dix-Hallpike Left   Dix-Hallpike Left Duration none   Dix-Hallpike Left Symptoms No nystagmus     Horizontal Canal Right   Horizontal Canal Right Duration none   Horizontal Canal Right Symptoms Normal     Horizontal  Canal Left   Horizontal Canal Left Duration 30 sec   Horizontal Canal Left Symptoms --  upbeating, L rotary x seconds                 [facility] Adult PT Treatment/Exercise - 06/11/17 1946      Neuro Re-ed    Neuro Re-ed Details  Corner balance exercises on pillow with eyes closed for high level balance tasks.      Exercises   Exercises Other Exercises   Other Exercises  Reviewed prior home program for tension strentches for neck, anterior chest and scapular retraction/ axial retraction for postural stabilization.          Vestibular Treatment/Exercise - 06/11/17 1121      Vestibular Treatment/Exercise   Vestibular Treatment Provided Habituation;Canalith Repositioning   Canalith Repositioning Canal Roll Left   Habituation Exercises Horizontal Roll     Canal Roll Left   Number of Reps  1   Response Details  Performed after habituation and noted no dizziness with L roll to begin maneuver.     Horizontal Roll   Number of Reps  4   Symptom Description  Patient initially had symptoms with L rolling that fatigued to left with repetition; she noted "heaviness" in head with right rolling, but no nystagmus.   After first rep, she did not have visible nystagmus, but rather "an internal" sensation of mild spinning.     X1 Viewing Horizontal   Foot Position standing   Comments x 30 seconds standing               PT Education - 06/11/17 1154    Education provided Yes   Education Details HEP: reprinted exercises adding neck stabilization   Person(s) Educated Patient   Methods Explanation;Demonstration;Handout   Comprehension Returned demonstration;Verbalized understanding          PT Short Term Goals - 05/28/17 2016      PT SHORT TERM GOAL #1   Title STGs=LTGs           PT Long Term Goals - 06/11/17 1136      PT LONG TERM GOAL #1   Title The patient will return demo HEP for habituation, gaze.   Time 4   Period Weeks     PT LONG TERM GOAL #2   Title The  patient will have negative positional testing for nystagmus and dizziness.   Time 4   Period Weeks     PT LONG TERM GOAL #3  Title The patient will verbalize understanding of future mgmt of BPPV prior to d/c.   Time 4   Period Weeks     PT LONG TERM GOAL #4   Title The patient will return demo HEP for neck stabilization and stretching secondary to h/o chronic migraines with neck tension.   Time 4   Period Weeks     PT LONG TERM GOAL #5   Title Improve DHI from 76% to < or equal to 50% to demo dec'd self perception of dizziness.   Time 4   Period Weeks               Plan - 06/11/17 1947    Clinical Impression Statement The patient continues with mild vertigo that fatigues with repetition with a visible nystagmus in room light that has a latency period of 10 seconds.  This appears to be a positional nystagmus, however she gets an upbeat, L rotary nystagmus with L rolling versus an either geotropic or apogeotropic nystagmus.    PT Treatment/Interventions ADLs/Self Care Home Management;Therapeutic activities;Therapeutic exercise;Balance training;Neuromuscular re-education;Stair training;Gait training;Patient/family education;Canalith Repostioning;Vestibular;Manual techniques   PT Next Visit Plan Check comprehensive HEP, reassess positional vertigo and d/c planning   Consulted and Agree with Plan of Care Patient      Patient will benefit from skilled therapeutic intervention in order to improve the following deficits and impairments:  Abnormal gait, Dizziness, Decreased balance, Pain, Impaired flexibility  Visit Diagnosis: Abnormality of gait  BPPV (benign paroxysmal positional vertigo), left  Dizziness and giddiness     Problem List Patient Active Problem List   Diagnosis Date Noted  . Vitamin D deficiency 05/08/2017  . Class 1 obesity without serious comorbidity with body mass index (BMI) of 30.0 to 30.9 in adult 04/24/2017  . Vasomotor symptoms due to menopause  04/19/2017  . Periodic limb movement sleep disorder 03/28/2017  . Nocturnal hypoxemia 03/06/2017  . Morbid obesity ([place]) 03/06/2017  . Snoring 03/06/2017  . Gasping for breath 03/06/2017  . Sleep walking and eating 03/06/2017  . Episodic cluster headache, not intractable 03/06/2017  . Obesity (BMI 30.0-34.9) 01/29/2017  . Other insomnia 11/09/2016  . S/P left THA, AA 07/25/2016  . Irritable bowel syndrome with diarrhea 04/03/2016  . Chronic migraine 02/25/2016  . Left ventricular hypertrophy, mild 02/25/2016  . History of irritable bowel syndrome 02/15/2016  . 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 Osteoarthritis of left hip 11/01/2015  . Hearing loss of both ears 07/27/2015  . Post concussive syndrome 07/14/2015  . Depression (NOS) 07/02/2015  . Allergic rhinoconjunctivitis 07/02/2015  . Fibromyalgia syndrome 07/01/2015  . History of Clostridium difficile colitis, persistent 07/01/2015  . Impairment of balance 02/10/2015  . Essential hypertension 05/15/2013  . Hypothyroidism 05/15/2013  . Hyperlipidemia 05/15/2013  . Interstitial cystitis 05/15/2013  . Chronic migraine without aura 05/15/2013  . Spondylosis of lumbar region without myelopathy or radiculopathy 10/30/2011    Dequincy Born , PT 06/11/2017, 7:51 PM  [facility] [address] [place], [place], [address] Phone: [phone]   Fax:  [phone]  Name: Molly Giles MRN: [id number] Date of Birth: Mar 06, 1956

## 2017-06-14 ENCOUNTER — Ambulatory Visit (INDEPENDENT_AMBULATORY_CARE_PROVIDER_SITE_OTHER): Payer: BLUE CROSS/BLUE SHIELD | Admitting: Physician Assistant

## 2017-06-14 VITALS — BP 108/76 | HR 103 | Temp 97.6°F | Ht 66.0 in

## 2017-06-14 DIAGNOSIS — E669 Obesity, unspecified: Secondary | ICD-10-CM | POA: Diagnosis not present

## 2017-06-14 DIAGNOSIS — Z6830 Body mass index (BMI) 30.0-30.9, adult: Secondary | ICD-10-CM | POA: Diagnosis not present

## 2017-06-14 DIAGNOSIS — I10 Essential (primary) hypertension: Secondary | ICD-10-CM

## 2017-06-14 NOTE — Progress Notes (Signed)
Office: [phone]  /  Fax: [phone]   HPI:   Chief Complaint: OBESITY Molly Giles is here to discuss her progress with her obesity treatment plan. She is on the  follow the Category 2 plan + 100 and is following her eating plan approximately 95 % of the time. She states she is exercising 0 minutes 0 times per week. Molly Giles is doing well with weight loss. Has been planning her meals well and has been adding variety to her meals.  Her weight is   today and has had a weight loss of 2 pounds over a period of 2 weeks since her last visit. She has lost 7 lbs since starting treatment with [name].  Hypertension Molly Giles is a 61 y.o. female with hypertension.  Molly Giles denies chest pain or shortness of breath on exertion. She is working weight loss to help control her blood pressure with the goal of decreasing her risk of heart attack and stroke. Molly Giles blood pressure is currently controlled and was taken off her blood pressure medication by her PCP due to hypotension episodes.      ALLERGIES: Allergies  Allergen Reactions  . Sulfa Antibiotics Itching  . Codeine Nausea Only    "head nausea"   . Lyrica [Pregabalin] Other (See Comments)    Dizziness, nausea  . Betadine [Povidone Iodine] Rash    burning  . Hibiclens [Chlorhexidine Gluconate] Other (See Comments)    Burning  . Latex Itching and Rash    burning  . Wellbutrin [Bupropion] Anxiety    MEDICATIONS: Current Outpatient Prescriptions on File Prior to Visit  Medication Sig Dispense Refill  . citalopram (CELEXA) 20 MG tablet Take 1 tablet (20 mg total) by mouth daily. 90 tablet 1  . eletriptan (RELPAX) 40 MG tablet Take 1 tablet (40 mg total) by mouth as needed for migraine. may repeat in 2 hours if necessary 10 tablet 0  . estrogen, conjugated,-medroxyprogesterone (PREMPRO) 0.3-1.5 MG tablet Take 1 tablet by mouth at bedtime. 30 tablet 3  . ibuprofen (ADVIL,MOTRIN) 200 MG tablet Take 800 mg by mouth.     . levothyroxine  (SYNTHROID, LEVOTHROID) 125 MCG tablet TAKE 1/2 TABLET BY MOUTH DAILY 90 tablet 1  . loratadine (CLARITIN) 10 MG tablet Take 10 mg by mouth daily.    Molly Giles Kitchen nystatin-triamcinolone (MYCOLOG II) cream Apply 1 application topically 2 (two) times daily.     . prednisoLONE acetate (PRED FORTE) 1 % ophthalmic suspension Place 1 drop into both eyes daily.    . Probiotic Product ([place]) Take by mouth.    . rosuvastatin (CRESTOR) 10 MG tablet Take 1 tablet (10 mg total) by mouth daily. 90 tablet 3  . traMADol (ULTRAM) 50 MG tablet Take 2 tablets (100 mg total) by mouth 2 (two) times daily. 120 tablet 0  . Vitamin D, Ergocalciferol, (DRISDOL) 50000 units CAPS capsule Take 1 capsule (50,000 Units total) by mouth every 7 (seven) days. 4 capsule 0  . zolpidem (AMBIEN) 10 MG tablet TAKE 1 TABLET BY MOUTH AT BEDTIME AS NEEDED 90 tablet 0   No current facility-administered medications on file prior to visit.     PAST MEDICAL HISTORY: Past Medical History:  Diagnosis Date  . Allergic rhinoconjunctivitis 07/02/2015  . Arthritis   . Benign positional vertigo 04/2015   Responded well to [facility]  . Bruxism (teeth grinding)   . Chronic migraine 02/25/2016   Per Dr Jaynee Eagles review in notes from [facility] from September  2015. Showed total headache days last month 18. Severe headache days 7 days. Moderate headache days 5 days. Mild headache days last month sick days. Days without headache last month 10 days. Symptoms associated with photophobia, phonophobia, osmophobia, neck pain, dizziness, jaw pain, nasal congestion, vision disturbances, tingling and numbness, weakness and worsening with activity. Each headache attack last 3 hours depending on treatment in severity. Left side, the right side, easier side, the frontal area in the back of the head. Characterized as throbbing, pressure, tightness, squeezing, stabbing and burning   . Chronic migraine w/o aura w/o status  migrainosus, not intractable 07/14/2015  . DDD (degenerative disc disease), lumbar   . Degenerative cervical disc   . Depression   . Dysrhythmia    seen by dr Wynonia Lawman- not a problem since she has been on Bystolic  . Essential hypertension 05/15/2013  . Family history of adverse reaction to anesthesia    Brother- N/V  . Family history of premature CAD 05/15/2013  . Fibromyalgia   . GERD (gastroesophageal reflux disease) 12/16/2014  . H/O seasonal allergies   . Hearing loss of both ears 07/27/2015   mild to borderline moderate low frequency hearing loss improving to within normal limits bilaterally on audiology testing at [facility] in November 2016.    Molly Giles Kitchen History of Clostridium difficile colitis 07/01/2015   Required Fecal Transplantation tocure  . History of irritable bowel syndrome 02/15/2016  . History of migraine headaches 05/15/2013   Per Dr Jaynee Eagles review in notes from [facility] from September 2015. Showed total headache days last month 18. Severe headache days 7 days. Moderate headache days 5 days. Mild headache days last month sick days. Days without headache last month 10 days. Symptoms associated with photophobia, phonophobia, osmophobia, neck pain, dizziness, jaw pain, nasal congestion, vision disturbances, tingling and numbness, weakness and worsening with activity. Each headache attack last 3 hours depending on treatment in severity. Left side, the right side, easier side, the frontal area in the back of the head. Characterized as throbbing, pressure, tightness, squeezing, stabbing and burning    . Hx of bad fall 02/2015   Severe Facial/head trauma without fracture  . Hyperlipidemia 1998  . Hypertension 2004  . Hypothyroidism   . Impairment of balance 02/2015   Consequent of postconcussive syndrome  . Interstitial cystitis   . 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 Migraine 1962   Chronic Migraines  . Mood disorder ([place])   . Musculoskeletal neck pain  07/14/2015  . Normal coronary arteries 05/14/2014  . Osteoarthritis of left hip 11/01/2015   MRI order by Dr Alvan Dame (ortho) 10/2015 showed significant arthritis of left hip joint with cystic changes in femoral head c/w osteoarthritis  . 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 Overweight   . Pain in joint of left shoulder 11/09/2016  . Palpitations 05/15/2013   [facility] manages  . Periodic limb movement sleep disorder 03/28/2017  . Perirectal cyst 05/07/2016  . PONV (postoperative nausea and vomiting)   . Post concussion syndrome 06/06/2015  . Post concussive syndrome 07/14/2015   Ms Simms's post-concussive syndrome manifesting in vertigo and headache, mood changes, poor balance, dizziness, and decreased concentration per Dr Jaynee Eagles at [facility].   Molly Giles Kitchen Posterior vitreous detachment of right eye 2014  . Shortness of breath dyspnea    with exertion  . Thyroid nodule 08/11/2009   Findings: The thyroid gland is within normal limits in size.  The gland  is diffusely inhomogeneous. A small solid nodule is noted in the lower pole  medially on the right of 7 x 6 x 8 mm. A small solid nodule is noted inferiorly on the left of 3 x 3 x 4 mm.  IMPRESSION:  The thyroid gland is within normal limits in size with only small solid nodules present, the largest of only 8 mm in diameter on the right.    . Vasomotor symptoms due to menopause 04/19/2017    PAST SURGICAL HISTORY: Past Surgical History:  Procedure Laterality Date  . Bladder dilitation     x 3  . BREAST BIOPSY  2011   Benign histology  . CARDIOVASCULAR STRESS TEST  2000   Unremarkable per pt report  . CARPOMETACARPAL JOINT ARTHROTOMY Right 2011  . COLONOSCOPY    . COLONOSCOPY WITH PROPOFOL N/A 04/21/2015   Procedure: COLONOSCOPY WITH PROPOFOL;  Surgeon: Carol Ada, MD;  Location: [facility];  Service: Endoscopy;  Laterality: N/A;  . EPIDURAL BLOCK INJECTION Left 04/12/2016   Left Medial  Nerve Block and Left L5 ramus block, Dr Suella Broad   . EPIDURAL BLOCK INJECTION  03/21/2016   Left L3-4 medial branch block and Left L5 & dorsal ramus block   . EPIDURAL BLOCK INJECTION N/A 10/25/2016   Suella Broad, MD. Lumbar medial branch block  . EPIDURAL BLOCK INJECTION N/A 02/09/2017   Suella Broad, MD.  Bilateral L3/4 medial branch block, bilateral L5 dorsal ramus block  . FECAL TRANSPLANT  04/21/2015   Procedure: FECAL TRANSPLANT;  Surgeon: Carol Ada, MD;  Location: [facility];  Service: Endoscopy;;  . INJECTION HIP INTRA ARTICULAR Left 11/2015   for OA by Dr Suella Broad  . OTHER SURGICAL HISTORY Left 2016   Left L3/L4 medial nerve block and Left L5 Dorsal Ramus block Dr Mickel Duhamel  . TOTAL HIP ARTHROPLASTY Left 07/25/2016   Procedure: LEFT TOTAL HIP ARTHROPLASTY ANTERIOR APPROACH;  Surgeon: Paralee Cancel, MD;  Location: [facility];  Service: Orthopedics;  Laterality: Left;    SOCIAL HISTORY: Social History  Substance Use Topics  . Smoking status: Passive Smoke Exposure - Never Smoker  . Smokeless tobacco: Never Used  . Alcohol use No     Comment: No use in 30 years.    FAMILY HISTORY: Family History  Problem Relation Age of Onset  . Alzheimer's disease Mother   . Hyperlipidemia Mother   . Hypertension Mother   . Osteoporosis Mother   . Parkinson's disease Mother   . Migraines Sister   . Allergies Sister   . Hyperlipidemia Brother   . Cardiomyopathy Brother   . Diabetes type II Brother   . Kidney disease Brother   . Hypertension Brother   . Hyperlipidemia Brother   . Kidney disease Brother   . Heart disease Father   . Hyperlipidemia Father   . Hypertension Father   . Aortic aneurysm Father   . Early death Father 64  . Hypertension Sister   . Diabetes type II Unknown   . Breast cancer Maternal Aunt   . Asthma Brother   . Heart disease Brother     ROS: Review of Systems  Constitutional: Positive for weight loss.  Respiratory: Negative for shortness  of breath.   Cardiovascular: Negative for chest pain.    PHYSICAL EXAM: Blood pressure 108/76, pulse (!) 103, temperature 97.6 F (36.4 C), temperature source Oral, height 5\' 6"  (1.676 m), SpO2 98 %. There is no height or weight on file to calculate BMI. Physical  Exam  Constitutional: She is oriented to person, place, and time. She appears well-developed and well-nourished.  Cardiovascular:  Tachycardic  Pulmonary/Chest: Effort normal.  Neurological: She is alert and oriented to person, place, and time.  Skin: Skin is warm and dry.  Psychiatric: She has a normal mood and affect.  Vitals reviewed.   RECENT LABS AND TESTS: BMET    Component Value Date/Time   NA 142 03/22/2017 1057   K 4.8 03/22/2017 1057   CL 103 03/22/2017 1057   CO2 25 03/22/2017 1057   GLUCOSE 76 03/22/2017 1057   GLUCOSE 132 (H) 07/26/2016 0419   BUN 7 (L) 03/22/2017 1057   CREATININE 0.79 03/22/2017 1057   CREATININE 0.74 02/24/2016 1158   CALCIUM 9.4 03/22/2017 1057   GFRNONAA 81 03/22/2017 1057   GFRNONAA 88 02/24/2016 1158   GFRAA 93 03/22/2017 1057   GFRAA >89 02/24/2016 1158   Lab Results  Component Value Date   HGBA1C 5.3 03/22/2017   Lab Results  Component Value Date   INSULIN 10.5 03/22/2017   CBC    Component Value Date/Time   WBC 6.6 03/22/2017 1057   WBC 15.6 (H) 07/26/2016 0419   RBC 4.40 03/22/2017 1057   RBC 3.73 (L) 07/26/2016 0419   HGB 12.9 03/22/2017 1057   HCT 39.6 03/22/2017 1057   PLT 236 07/26/2016 0419   MCV 90 03/22/2017 1057   MCH 29.3 03/22/2017 1057   MCH 29.2 07/26/2016 0419   MCHC 32.6 03/22/2017 1057   MCHC 32.4 07/26/2016 0419   RDW 14.3 03/22/2017 1057   LYMPHSABS 2.1 03/22/2017 1057   EOSABS 0.1 03/22/2017 1057   BASOSABS 0.0 03/22/2017 1057   Iron/TIBC/Ferritin/ %Sat No results found for: IRON, TIBC, FERRITIN, IRONPCTSAT Lipid Panel     Component Value Date/Time   CHOL 152 03/22/2017 1057   TRIG 120 03/22/2017 1057   HDL 53 03/22/2017 1057     CHOLHDL 3.9 12/14/2014 1036   VLDL 26 12/14/2014 1036   LDLCALC 75 03/22/2017 1057   Hepatic Function Panel     Component Value Date/Time   PROT 6.5 03/22/2017 1057   ALBUMIN 4.4 03/22/2017 1057   AST 33 03/22/2017 1057   ALT 46 (H) 03/22/2017 1057   ALKPHOS 90 03/22/2017 1057   BILITOT 0.3 03/22/2017 1057      Component Value Date/Time   TSH 3.190 03/22/2017 1057   TSH 4.000 01/25/2017 1109   TSH 3.70 02/24/2016 1158    ASSESSMENT AND PLAN: Essential hypertension  Class 1 obesity with serious comorbidity and body mass index (BMI) of 30.0 to 30.9 in adult, unspecified obesity type  PLAN:  Hypertension We discussed sodium restriction, working on healthy weight loss, and a regular exercise program as the means to achieve improved blood pressure control. Molly Giles agreed with this plan and agreed to follow up as directed. We will continue to monitor her blood pressure as well as her progress with the above lifestyle modifications. She will continue her medications as prescribed and will watch for signs of hypotension as she continues her lifestyle modifications.  Obesity Molly Giles is currently in the action stage of change. As such, her goal is to continue with weight loss efforts She has agreed to follow the Category 2 plan, 400 calories 35 grams protein for lunch Molly Giles has been instructed to work up to a goal of 150 minutes of combined cardio and strengthening exercise per week for weight loss and overall health benefits. We discussed the following Behavioral Modification Stratagies  today: increasing lean protein intake and work on meal planning and easy cooking plans  Molly Giles has agreed to follow up with our clinic in 2 weeks. She was informed of the importance of frequent follow up visits to maximize her success with intensive lifestyle modifications for her multiple health conditions.  I, April Moore, am acting as Location manager for [employer], PA-C  I have reviewed the above  documentation for accuracy and completeness, and I agree with the above. -Lacy Duverney, PA-C  I have reviewed the above note and agree with the plan. -Dennard Nip, MD

## 2017-06-17 NOTE — Progress Notes (Signed)
Office Visit Note  Patient: Molly Giles             Date of Birth: Nov 20, 1955           MRN: [id number]             PCP: McDiarmid, Blane Ohara, MD Referring: McDiarmid, Blane Ohara, MD Visit Date: 06/26/2017 Occupation: @GUAROCC @    Subjective: Generalized pain.   History of Present Illness: Molly Giles is a 61 y.o. female with history of fibromyalgia osteoarthritis and disc disease. She states she's been experiencing increased pain lately. She describes pain and discomfort in her left trochanteric area. She is having difficulty sleeping on her left side. She also has discomfort in her neck around the trapezius area. Her left total hip replacement is doing well. She also has discomfort in her bilateral hands especially over the [facility] joints.  Activities of Daily Living:  Patient reports morning stiffness for 30 minutes.   Patient Reports nocturnal pain.  Difficulty dressing/grooming: Denies Difficulty climbing stairs: Reports Difficulty getting out of chair: Denies Difficulty using hands for taps, buttons, cutlery, and/or writing: Denies   Review of Systems  Constitutional: Positive for fatigue and weight loss. Negative for activity change and weakness.       Intentional  HENT: Positive for mouth dryness.   Eyes: Positive for dryness. Negative for redness and itching.  Respiratory: Negative for shortness of breath.   Cardiovascular: Negative for swelling in legs/feet.  Gastrointestinal: Negative for diarrhea and nausea.  Endocrine: Negative for cold intolerance, heat intolerance and excessive thirst.  Genitourinary: Negative for painful urination.  Musculoskeletal: Positive for arthralgias, joint pain, morning stiffness and muscle tenderness. Negative for gait problem and joint swelling.  Skin: Negative for rash.  Neurological: Positive for headaches. Negative for dizziness, light-headedness, numbness and memory loss.  Hematological: Negative for bruising/bleeding tendency.    Psychiatric/Behavioral: Negative for confusion and sleep disturbance.    PMFS History:  Patient Active Problem List   Diagnosis Date Noted  . Vitamin D deficiency 05/08/2017  . Class 1 obesity without serious comorbidity with body mass index (BMI) of 30.0 to 30.9 in adult 04/24/2017  . Vasomotor symptoms due to menopause 04/19/2017  . Periodic limb movement sleep disorder 03/28/2017  . Nocturnal hypoxemia 03/06/2017  . Morbid obesity ([place]) 03/06/2017  . Snoring 03/06/2017  . Gasping for breath 03/06/2017  . Sleep walking and eating 03/06/2017  . Episodic cluster headache, not intractable 03/06/2017  . Obesity (BMI 30.0-34.9) 01/29/2017  . Other insomnia 11/09/2016  . S/P left THA, AA 07/25/2016  . Irritable bowel syndrome with diarrhea 04/03/2016  . Chronic migraine 02/25/2016  . Left ventricular hypertrophy, mild 02/25/2016  . History of irritable bowel syndrome 02/15/2016  . 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 Osteoarthritis of left hip 11/01/2015  . Hearing loss of both ears 07/27/2015  . Post concussive syndrome 07/14/2015  . Depression (NOS) 07/02/2015  . Allergic rhinoconjunctivitis 07/02/2015  . Fibromyalgia syndrome 07/01/2015  . History of Clostridium difficile colitis, persistent 07/01/2015  . Impairment of balance 02/10/2015  . Essential hypertension 05/15/2013  . Hypothyroidism 05/15/2013  . Hyperlipidemia 05/15/2013  . Interstitial cystitis 05/15/2013  . Chronic migraine without aura 05/15/2013  . Spondylosis of lumbar region without myelopathy or radiculopathy 10/30/2011    Past Medical History:  Diagnosis Date  . Allergic rhinoconjunctivitis 07/02/2015  . Arthritis   . Benign positional vertigo 04/2015   Responded well to [facility] Rehab  . Bruxism (teeth grinding)   .  Chronic migraine 02/25/2016   Per Dr Jaynee Eagles review in notes from [facility] from September 2015. Showed total headache days last month 18. Severe  headache days 7 days. Moderate headache days 5 days. Mild headache days last month sick days. Days without headache last month 10 days. Symptoms associated with photophobia, phonophobia, osmophobia, neck pain, dizziness, jaw pain, nasal congestion, vision disturbances, tingling and numbness, weakness and worsening with activity. Each headache attack last 3 hours depending on treatment in severity. Left side, the right side, easier side, the frontal area in the back of the head. Characterized as throbbing, pressure, tightness, squeezing, stabbing and burning   . Chronic migraine w/o aura w/o status migrainosus, not intractable 07/14/2015  . DDD (degenerative disc disease), lumbar   . Degenerative cervical disc   . Depression   . Dysrhythmia    seen by dr Wynonia Lawman- not a problem since she has been on Bystolic  . Essential hypertension 05/15/2013  . Family history of adverse reaction to anesthesia    Brother- N/V  . Family history of premature CAD 05/15/2013  . Fibromyalgia   . GERD (gastroesophageal reflux disease) 12/16/2014  . H/O seasonal allergies   . Hearing loss of both ears 07/27/2015   mild to borderline moderate low frequency hearing loss improving to within normal limits bilaterally on audiology testing at [facility] in November 2016.    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 History of irritable bowel syndrome 02/15/2016  . History of migraine headaches 05/15/2013   Per Dr Jaynee Eagles review in notes from [facility] from September 2015. Showed total headache days last month 18. Severe headache days 7 days. Moderate headache days 5 days. Mild headache days last month sick days. Days without headache last month 10 days. Symptoms associated with photophobia, phonophobia, osmophobia, neck pain, dizziness, jaw pain, nasal congestion, vision disturbances, tingling and numbness, weakness and worsening with activity. Each  headache attack last 3 hours depending on treatment in severity. Left side, the right side, easier side, the frontal area in the back of the head. Characterized as throbbing, pressure, tightness, squeezing, stabbing and burning    . Hx of bad fall 02/2015   Severe Facial/head trauma without fracture  . Hyperlipidemia 1998  . Hypertension 2004  . Hypothyroidism   . Impairment of balance 02/2015   Consequent of postconcussive syndrome  . Interstitial cystitis   . 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 Migraine 1962   Chronic Migraines  . Mood disorder ([place])   . Musculoskeletal neck pain 07/14/2015  . Normal coronary arteries 05/14/2014  . Osteoarthritis of left hip 11/01/2015   MRI order by Dr Alvan Dame (ortho) 10/2015 showed significant arthritis of left hip joint with cystic changes in femoral head c/w osteoarthritis  . 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 Overweight   . Pain in joint of left shoulder 11/09/2016  . Palpitations 05/15/2013   [facility] manages  . Periodic limb movement sleep disorder 03/28/2017  . Perirectal cyst 05/07/2016  . PONV (postoperative nausea and vomiting)   . Post concussion syndrome 06/06/2015  . Post concussive syndrome 07/14/2015   Ms Swift's post-concussive syndrome manifesting in vertigo and headache, mood changes, poor balance, dizziness, and decreased concentration per Dr Jaynee Eagles at [facility].   Marland Kitchen Posterior vitreous detachment of right eye 2014  . Shortness of breath dyspnea    with exertion  . Thyroid  nodule 08/11/2009   Findings: The thyroid gland is within normal limits in size.  The gland is diffusely inhomogeneous. A small solid nodule is noted in the lower pole  medially on the right of 7 x 6 x 8 mm. A small solid nodule is noted inferiorly on the left of 3 x 3 x 4 mm.  IMPRESSION:  The thyroid gland is within normal limits in size with only small solid nodules present, the largest of only 8  mm in diameter on the right.    . Vasomotor symptoms due to menopause 04/19/2017    Family History  Problem Relation Age of Onset  . Alzheimer's disease Mother   . Hyperlipidemia Mother   . Hypertension Mother   . Osteoporosis Mother   . Parkinson's disease Mother   . Migraines Sister   . Allergies Sister   . Hypertension Sister   . Hyperlipidemia Brother   . Cardiomyopathy Brother   . Diabetes type II Brother   . Kidney disease Brother   . Asthma Brother   . Heart disease Brother   . Hypertension Brother   . Hyperlipidemia Brother   . Kidney disease Brother   . Heart disease Father   . Hyperlipidemia Father   . Hypertension Father   . Aortic aneurysm Father   . Early death Father 64  . Diabetes type II Unknown   . Breast cancer Maternal Aunt    Past Surgical History:  Procedure Laterality Date  . Bladder dilitation     x 3  . BREAST BIOPSY  2011   Benign histology  . CARDIOVASCULAR STRESS TEST  2000   Unremarkable per pt report  . CARPOMETACARPAL JOINT ARTHROTOMY Right 2011  . COLONOSCOPY    . COLONOSCOPY WITH PROPOFOL N/A 04/21/2015   Procedure: COLONOSCOPY WITH PROPOFOL;  Surgeon: Carol Ada, MD;  Location: [place];  Service: Endoscopy;  Laterality: N/A;  . EPIDURAL BLOCK INJECTION Left 04/12/2016   Left Medial Nerve Block and Left L5 ramus block, Dr Suella Broad   . EPIDURAL BLOCK INJECTION  03/21/2016   Left L3-4 medial branch block and Left L5 & dorsal ramus block   . EPIDURAL BLOCK INJECTION N/A 10/25/2016   Suella Broad, MD. Lumbar medial branch block  . EPIDURAL BLOCK INJECTION N/A 02/09/2017   Suella Broad, MD.  Bilateral L3/4 medial branch block, bilateral L5 dorsal ramus block  . FECAL TRANSPLANT  04/21/2015   Procedure: FECAL TRANSPLANT;  Surgeon: Carol Ada, MD;  Location: [facility];  Service: Endoscopy;;  . HIP ARTHROPLASTY    . INJECTION HIP INTRA ARTICULAR Left 11/2015   for OA by Dr Suella Broad  . OTHER SURGICAL HISTORY Left 2016    Left L3/L4 medial nerve block and Left L5 Dorsal Ramus block Dr Mickel Duhamel  . TOTAL HIP ARTHROPLASTY Left 07/25/2016   Procedure: LEFT TOTAL HIP ARTHROPLASTY ANTERIOR APPROACH;  Surgeon: Paralee Cancel, MD;  Location: [facility];  Service: Orthopedics;  Laterality: Left;   Social History   Social History Narrative   Prior PCP With [facility] at [place], [place].   Married, lives with [place], (b. 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    Wears seatbelt usually   No religious beliefs affecting healthcare   No difficulty taking medications as directed.       Home has working smoke alarm   No home throw rugs   Does not have  nonslip bathtub / shower    Has railings on all stairs   Home is free from [place].    Right-handed.      No history of Hospitalization as of 06/2015.       Best number to reach patient [phone] (M) as of 07/01/15   It is permissible to leave messages as of 07/01/15 .    No regular exercise of 3 times a week for 30 minutes at a time      Aston Lawhorn has Advanced Directive and a Doctor, general practice is husband, Avea Mcgowen [phone]      Right-handed   Caffeine: 3 cups coffee per day, sometimes 2-3 cups of tea     Objective: Vital Signs: BP 131/85 (BP Location: Left Arm, Patient Position: Sitting, Cuff Size: Normal)   Pulse 86   Resp 14   Ht 5' 6.5" (1.689 m)   Wt 190 lb (86.2 kg)   BMI 30.21 kg/m    Physical Exam  Constitutional: She is oriented to person, place, and time. She appears well-developed and well-nourished.  HENT:  Head: Normocephalic and atraumatic.  Eyes: Conjunctivae and EOM are normal.  Neck: Normal range of motion.  Cardiovascular: Normal rate, regular rhythm, normal heart sounds and intact distal pulses.   Pulmonary/Chest: Effort normal and breath sounds normal.  Abdominal: Soft. Bowel sounds are normal.  Lymphadenopathy:    She has no  cervical adenopathy.  Neurological: She is alert and oriented to person, place, and time.  Skin: Skin is warm and dry. Capillary refill takes less than 2 seconds.  Psychiatric: She has a normal mood and affect. Her behavior is normal.  Nursing note and vitals reviewed.    Musculoskeletal Exam: C-spine and thoracic lumbar spine good range of motion. She is some discomfort in bilateral trapezius area. Shoulder joints elbow joints wrist joints are good range of motion. She has no synovitis in her hands. She has bilateral CMC prominence consistent with osteoarthritis. She has left total hip replacement with good range of motion. She has left trochanteric bursitis with tenderness. Knee joints ankles MTPs with good range of motion.  CDAI Exam: No CDAI exam completed.    Investigation: No additional findings. CBC Latest Ref Rng & Units 03/22/2017 07/26/2016 07/17/2016  WBC 3.4 - 10.8 x10E3/uL 6.6 15.6(H) 8.2  Hemoglobin 11.1 - 15.9 g/dL 12.9 10.9(L) 13.0  Hematocrit 34.0 - 46.6 % 39.6 33.6(L) 40.6  Platelets 150 - 400 K/uL - 236 294   CMP     Component Value Date/Time   NA 142 03/22/2017 1057   K 4.8 03/22/2017 1057   CL 103 03/22/2017 1057   CO2 25 03/22/2017 1057   GLUCOSE 76 03/22/2017 1057   GLUCOSE 132 (H) 07/26/2016 0419   BUN 7 (L) 03/22/2017 1057   CREATININE 0.79 03/22/2017 1057   CREATININE 0.74 02/24/2016 1158   CALCIUM 9.4 03/22/2017 1057   PROT 6.5 03/22/2017 1057   ALBUMIN 4.4 03/22/2017 1057   AST 33 03/22/2017 1057   ALT 46 (H) 03/22/2017 1057   ALKPHOS 90 03/22/2017 1057   BILITOT 0.3 03/22/2017 1057   GFRNONAA 81 03/22/2017 1057   GFRNONAA 88 02/24/2016 1158   GFRAA 93 03/22/2017 1057   GFRAA >89 02/24/2016 1158  UDS 01/11/2017  Imaging: No results found.  Speciality Comments: No specialty comments available.    Procedures:  No procedures performed Allergies: Sulfa antibiotics; Codeine; Lyrica [pregabalin]; Betadine [povidone iodine]; Hibiclens  [chlorhexidine gluconate]; Latex; and Wellbutrin [bupropion]  Assessment / Plan:     Visit Diagnoses: Fibromyalgia -Patient continues to have generalized pain and discomfort and positive tender points. She's been experiencing increased pain lately. She describes increase place and trapezius area.   Other fatigue: Due to insomnia.  Other insomnia - on Ambien 10 mg po qhs. Side effects were reviewed and refilled was given today.  Primary osteoarthritis of both hands: She has severe CMC arthritis which causes discomfort. She requested a prescription for diclofenac gel which was filled and side effects were reviewed.  Trochanteric bursitis of left hip -she had tenderness over left trochanteric bursa which is causing discomfort. I have referred her to physical therapy. Plan: Ambulatory referral to Physical Therapy  Status post left hip replacement: Doing better  Neck pain -she has bilateral trapezius spasm. Plan: Ambulatory referral to Physical Therapy  DDD (degenerative disc disease), lumbar: Chronic pain.  Other chronic pain - om Tramadol 50 mg, 2 tas po bid. Her labs have been stable.  History of vitamin D deficiency: She is on supplement.  History of migraine: She's been experiencing increased migraines and will see a neurologist.  History of depression - celexa 20 mg po qhs. It has been controlling her depression fairly well.  Her other medical problems are listed as follows:  IC (interstitial cystitis)  History of hypothyroidism  History of hypertension  History of hyperlipidemia    Orders: Orders Placed This Encounter  Procedures  . Ambulatory referral to Physical Therapy   Meds ordered this encounter  Medications  . citalopram (CELEXA) 20 MG tablet    Sig: Take 1 tablet (20 mg total) by mouth daily.    Dispense:  90 tablet    Refill:  1  . zolpidem (AMBIEN) 10 MG tablet    Sig: Take 1 tablet (10 mg total) by mouth at bedtime as needed.    Dispense:  90 tablet     Refill:  0  . diclofenac sodium (VOLTAREN) 1 % GEL    Sig: Apply 3 gm to 3 large joints up to 3 times a day.Dispense 3 tubes with 3 refills.    Dispense:  3 Tube    Refill:  2      Follow-Up Instructions: Return in about 6 months (around 12/25/2017) for FMS, OA, DDD.   Bo Merino, MD  Note - This record has been created using Editor, commissioning.  Chart creation errors have been sought, but may not always  have been located. Such creation errors do not reflect on  the standard of medical care.

## 2017-06-22 ENCOUNTER — Telehealth: Payer: Self-pay | Admitting: Pharmacist

## 2017-06-22 NOTE — Telephone Encounter (Signed)
Amb BP monitor results obtained asked to return call to discuss.

## 2017-06-22 NOTE — Progress Notes (Signed)
   S:     ABPM Study Data: Arm Placement left arm   Overall Mean 24hr BP:   111/72 mmHg HR: 74   Daytime Mean BP:  113/75 mmHg HR: 75   Nighttime Mean BP:  106/65 mmHg HR: 71   Dipping Pattern: No. for systolic:   Sys:   4.3%   Dia: 13%   [normal dipping ~10-20%]   Non-hypertensive ABPM thresholds: daytime BP <135/85 mmHg, sleeptime BP <120/70 mmHg NICE Hypertension Guidelines ([place]) using ABPM: Stage I: >135/85 mmHg, Stage 2: >150/95 mmHg)   BMET    Component Value Date/Time   NA 142 03/22/2017 1057   K 4.8 03/22/2017 1057   CL 103 03/22/2017 1057   CO2 25 03/22/2017 1057   GLUCOSE 76 03/22/2017 1057   GLUCOSE 132 (H) 07/26/2016 0419   BUN 7 (L) 03/22/2017 1057   CREATININE 0.79 03/22/2017 1057   CREATININE 0.74 02/24/2016 1158   CALCIUM 9.4 03/22/2017 1057   GFRNONAA 81 03/22/2017 1057   GFRNONAA 88 02/24/2016 1158   GFRAA 93 03/22/2017 1057   GFRAA >89 02/24/2016 1158    A/P: Late Entry due to equiment failure and subsequent data recovery upon repair 06/22/2017.   History of hypertension found to have white coat hypertension (BP at goal)  Given 24-hour ambulatory blood pressure demonstrates excellent control with an average blood pressure of 111/72 mmHg, and a nocturnal dipping pattern that is normal with dyastole however only reduced 6.7% with systole. No changes to medications and suggest none in the near future.    Attempted to reach patient by phone.  Asked for her to call back to review results.  Will enter phone note if she returns call.

## 2017-06-22 NOTE — Telephone Encounter (Signed)
Returned call RE amb BP results - shared numbers including overall of 111/72 - patient was appreciatvie.  She was concerned she contiues to have some 'dizziness".   I shared that I will provide results to her PCP - Dr. McDiarmid and encouraged her to f/u if symptoms continue.   She was appreciative of the call - thought the test device malfunction may have been problematic.

## 2017-06-25 ENCOUNTER — Telehealth: Payer: Self-pay | Admitting: *Deleted

## 2017-06-25 ENCOUNTER — Ambulatory Visit: Payer: BLUE CROSS/BLUE SHIELD | Admitting: Rehabilitative and Restorative Service Providers"

## 2017-06-25 DIAGNOSIS — R269 Unspecified abnormalities of gait and mobility: Secondary | ICD-10-CM | POA: Diagnosis not present

## 2017-06-25 DIAGNOSIS — H8112 Benign paroxysmal vertigo, left ear: Secondary | ICD-10-CM

## 2017-06-25 DIAGNOSIS — R42 Dizziness and giddiness: Secondary | ICD-10-CM

## 2017-06-25 NOTE — Therapy (Signed)
[place] [address] [place], [place], [address] Phone: [phone]   Fax:  [phone]  Physical Therapy Treatment  Patient Details  Name: Molly Giles MRN: [id number] Date of Birth: August 01, 1956 Referring Provider: Heide Spark, MD  Encounter Date: 06/25/2017      PT End of Session - 06/25/17 0953    Visit Number 4   Number of Visits 8   Date for PT Re-Evaluation 06/27/17   Authorization Type private insurance   PT Start Time [phone]   PT Stop Time 1008   PT Time Calculation (min) 33 min   Activity Tolerance Patient tolerated treatment well   Behavior During Therapy [facility] for tasks assessed/performed      Past Medical History:  Diagnosis Date  . Allergic rhinoconjunctivitis 07/02/2015  . Arthritis   . Benign positional vertigo 04/2015   Responded well to [facility]  . Bruxism (teeth grinding)   . Chronic migraine 02/25/2016   Per Dr Jaynee Eagles review in notes from [facility] from September 2015. Showed total headache days last month 18. Severe headache days 7 days. Moderate headache days 5 days. Mild headache days last month sick days. Days without headache last month 10 days. Symptoms associated with photophobia, phonophobia, osmophobia, neck pain, dizziness, jaw pain, nasal congestion, vision disturbances, tingling and numbness, weakness and worsening with activity. Each headache attack last 3 hours depending on treatment in severity. Left side, the right side, easier side, the frontal area in the back of the head. Characterized as throbbing, pressure, tightness, squeezing, stabbing and burning   . Chronic migraine w/o aura w/o status migrainosus, not intractable 07/14/2015  . DDD (degenerative disc disease), lumbar   . Degenerative cervical disc   . Depression   . Dysrhythmia    seen by dr Wynonia Lawman- not a problem since she has been on Bystolic  . Essential hypertension 05/15/2013  . Family history  of adverse reaction to anesthesia    Brother- N/V  . Family history of premature CAD 05/15/2013  . Fibromyalgia   . GERD (gastroesophageal reflux disease) 12/16/2014  . H/O seasonal allergies   . Hearing loss of both ears 07/27/2015   mild to borderline moderate low frequency hearing loss improving to within normal limits bilaterally on audiology testing at [facility] in November 2016.    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 History of irritable bowel syndrome 02/15/2016  . History of migraine headaches 05/15/2013   Per Dr Jaynee Eagles review in notes from [facility] from September 2015. Showed total headache days last month 18. Severe headache days 7 days. Moderate headache days 5 days. Mild headache days last month sick days. Days without headache last month 10 days. Symptoms associated with photophobia, phonophobia, osmophobia, neck pain, dizziness, jaw pain, nasal congestion, vision disturbances, tingling and numbness, weakness and worsening with activity. Each headache attack last 3 hours depending on treatment in severity. Left side, the right side, easier side, the frontal area in the back of the head. Characterized as throbbing, pressure, tightness, squeezing, stabbing and burning    . Hx of bad fall 02/2015   Severe Facial/head trauma without fracture  . Hyperlipidemia 1998  . Hypertension 2004  . Hypothyroidism   . Impairment of balance 02/2015   Consequent of postconcussive syndrome  . Interstitial cystitis   . 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 Migraine 1962   Chronic Migraines  . Mood  disorder ([place])   . Musculoskeletal neck pain 07/14/2015  . Normal coronary arteries 05/14/2014  . Osteoarthritis of left hip 11/01/2015   MRI order by Dr Alvan Dame (ortho) 10/2015 showed significant arthritis of left hip joint with cystic changes in femoral head c/w osteoarthritis  . 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 Overweight   . Pain in joint of left shoulder 11/09/2016  . Palpitations 05/15/2013   [facility] manages  . Periodic limb movement sleep disorder 03/28/2017  . Perirectal cyst 05/07/2016  . PONV (postoperative nausea and vomiting)   . Post concussion syndrome 06/06/2015  . Post concussive syndrome 07/14/2015   [name]'s post-concussive syndrome manifesting in vertigo and headache, mood changes, poor balance, dizziness, and decreased concentration per Dr Jaynee Eagles at [facility].   Marland Kitchen Posterior vitreous detachment of right eye 2014  . Shortness of breath dyspnea    with exertion  . Thyroid nodule 08/11/2009   Findings: The thyroid gland is within normal limits in size.  The gland is diffusely inhomogeneous. A small solid nodule is noted in the lower pole  medially on the right of 7 x 6 x 8 mm. A small solid nodule is noted inferiorly on the left of 3 x 3 x 4 mm.  IMPRESSION:  The thyroid gland is within normal limits in size with only small solid nodules present, the largest of only 8 mm in diameter on the right.    . Vasomotor symptoms due to menopause 04/19/2017    Past Surgical History:  Procedure Laterality Date  . Bladder dilitation     x 3  . BREAST BIOPSY  2011   Benign histology  . CARDIOVASCULAR STRESS TEST  2000   Unremarkable per pt report  . CARPOMETACARPAL JOINT ARTHROTOMY Right 2011  . COLONOSCOPY    . COLONOSCOPY WITH PROPOFOL N/A 04/21/2015   Procedure: COLONOSCOPY WITH PROPOFOL;  Surgeon: Carol Ada, MD;  Location: [facility];  Service: Endoscopy;  Laterality: N/A;  . EPIDURAL BLOCK INJECTION Left 04/12/2016   Left Medial Nerve Block and Left L5 ramus block, Dr Suella Broad   . EPIDURAL BLOCK INJECTION  03/21/2016   Left L3-4 medial branch block and Left L5 & dorsal ramus block   . EPIDURAL BLOCK INJECTION N/A 10/25/2016   Suella Broad, MD. Lumbar medial branch block  . EPIDURAL BLOCK INJECTION N/A  02/09/2017   Suella Broad, MD.  Bilateral L3/4 medial branch block, bilateral L5 dorsal ramus block  . FECAL TRANSPLANT  04/21/2015   Procedure: FECAL TRANSPLANT;  Surgeon: Carol Ada, MD;  Location: [facility];  Service: Endoscopy;;  . INJECTION HIP INTRA ARTICULAR Left 11/2015   for OA by Dr Suella Broad  . OTHER SURGICAL HISTORY Left 2016   Left L3/L4 medial nerve block and Left L5 Dorsal Ramus block Dr Mickel Duhamel  . TOTAL HIP ARTHROPLASTY Left 07/25/2016   Procedure: LEFT TOTAL HIP ARTHROPLASTY ANTERIOR APPROACH;  Surgeon: Paralee Cancel, MD;  Location: [facility];  Service: Orthopedics;  Laterality: Left;    There were no vitals filed for this visit.      Subjective Assessment - 06/25/17 0935    Subjective The patient notes her dizziness/spinning was significantly improved, however she noted spinning this morning with rolling from left to right.  She has not had a HA since last Wednesday and she had dizziness with HA.  She also notes worsening sinus congestion that she feels is associated with spinning sensation this morning.  Pertinent History known to our clinic from 11/2015 (last appt) for vertigo.   Patient Stated Goals Reduce vertigo   Currently in Pain? No/denies                Vestibular Assessment - 06/25/17 0949      Orthostatics   BP supine (x 5 minutes) 104/76   HR supine (x 5 minutes) 69   BP standing (after 1 minute) 102/76   HR standing (after 1 minute) 79   BP standing (after 3 minutes) 112/76   HR standing (after 3 minutes) 65   Orthostatics Comment No lightheadedness upon standing.                  [place] Adult PT Treatment/Exercise - 06/25/17 1039      Self-Care   Self-Care Other Self-Care Comments   Other Self-Care Comments  Discussed continuation of HEP emphasizing postural stretching, and habituation (for mgmt of positional type symptoms).         Vestibular Treatment/Exercise - 06/25/17 0937      Vestibular Treatment/Exercise    Vestibular Treatment Provided Habituation   Habituation Exercises Horizontal Roll;Brandt Daroff;Standing Diagonal Head Turns     Nestor Lewandowsky   Number of Reps  1   Symptom Description  patient notes mild symptoms with return to sitting.      Horizontal Roll   Number of Reps  3   Symptom Description  Initially noted a sensation of "movement" in her head with left rolling that settles within 45 seconds. No dizziness with R roll or subsequent R<>L rolling x last 2 reps.     Standing Diagonal Head Turns   Number of Reps  5   Symptiom Description  Bending to touch L foot and then up/over R shoulder reaching for large amplitude diagonal movement.  Patient experiences nausea mild. Reaching to R foot and up/over L shoulder begins to provoke nausea at rep 3.                   PT Short Term Goals - 05/28/17 2016      PT SHORT TERM GOAL #1   Title STGs=LTGs           PT Long Term Goals - 06/25/17 2831      PT LONG TERM GOAL #1   Title The patient will return demo HEP for habituation, gaze.   Time 4   Period Weeks   Status On-going     PT LONG TERM GOAL #2   Title The patient will have negative positional testing for nystagmus and dizziness.   Baseline Patient notes no spinning and no nystagmus viewed in room light, however she still has a mild sensation of movement in her head.   Time 4   Period Weeks   Status Partially Met     PT LONG TERM GOAL #3   Title The patient will verbalize understanding of future mgmt of BPPV prior to d/c.   Baseline Patient is able to verbalize understanding of using habituation when tolerated for positional symptoms.    Time 4   Period Weeks   Status Achieved     PT LONG TERM GOAL #4   Title The patient will return demo HEP for neck stabilization and stretching secondary to h/o chronic migraines with neck tension.   Baseline Patient verbalizes understanding of home program.   Time 4   Period Weeks   Status Achieved     PT LONG TERM  GOAL #  5   Title Improve DHI from 76% to < or equal to 50% to demo dec'd self perception of dizziness.   Time 4   Period Weeks               Plan - 06/25/17 1040    Clinical Impression Statement The patient has met 3/5 LTGs. PT and patient discussed f/u by phone in 2 weeks (unless she has worsening symptoms) to ensure HEP going well.  She did not have nystagmus visible in room light today, however did experience some spinning this morning when getting out of bed.  She has some other factors including sinus pressure and migraines that may correlate with symmptoms.  PT recommends continued habituation.    PT Treatment/Interventions ADLs/Self Care Home Management;Therapeutic activities;Therapeutic exercise;Balance training;Neuromuscular re-education;Stair training;Gait training;Patient/family education;Canalith Repostioning;Vestibular;Manual techniques   PT Next Visit Plan f/u by phone in 2 weeks to determine if future visits needed.   Consulted and Agree with Plan of Care Patient      Patient will benefit from skilled therapeutic intervention in order to improve the following deficits and impairments:  Abnormal gait, Dizziness, Decreased balance, Pain, Impaired flexibility  Visit Diagnosis: Abnormality of gait  BPPV (benign paroxysmal positional vertigo), left  Dizziness and giddiness     Problem List Patient Active Problem List   Diagnosis Date Noted  . Vitamin D deficiency 05/08/2017  . Class 1 obesity without serious comorbidity with body mass index (BMI) of 30.0 to 30.9 in adult 04/24/2017  . Vasomotor symptoms due to menopause 04/19/2017  . Periodic limb movement sleep disorder 03/28/2017  . Nocturnal hypoxemia 03/06/2017  . Morbid obesity ([place]) 03/06/2017  . Snoring 03/06/2017  . Gasping for breath 03/06/2017  . Sleep walking and eating 03/06/2017  . Episodic cluster headache, not intractable 03/06/2017  . Obesity (BMI 30.0-34.9) 01/29/2017  . Other insomnia  11/09/2016  . S/P left THA, AA 07/25/2016  . Irritable bowel syndrome with diarrhea 04/03/2016  . Chronic migraine 02/25/2016  . Left ventricular hypertrophy, mild 02/25/2016  . History of irritable bowel syndrome 02/15/2016  . 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 Osteoarthritis of left hip 11/01/2015  . Hearing loss of both ears 07/27/2015  . Post concussive syndrome 07/14/2015  . Depression (NOS) 07/02/2015  . Allergic rhinoconjunctivitis 07/02/2015  . Fibromyalgia syndrome 07/01/2015  . History of Clostridium difficile colitis, persistent 07/01/2015  . Impairment of balance 02/10/2015  . Essential hypertension 05/15/2013  . Hypothyroidism 05/15/2013  . Hyperlipidemia 05/15/2013  . Interstitial cystitis 05/15/2013  . Chronic migraine without aura 05/15/2013  . Spondylosis of lumbar region without myelopathy or radiculopathy 10/30/2011    Keeshawn Fakhouri, PT 06/25/2017, 10:43 AM  [facility] [address] [place], [place], [address] Phone: [phone]   Fax:  [phone]  Name: Molly Giles MRN: [id number] Date of Birth: Aug 07, 1956

## 2017-06-25 NOTE — Telephone Encounter (Signed)
Canceled her upcoming Botox appt. Due to side effects. Wants to discuss.  Please call

## 2017-06-25 NOTE — Telephone Encounter (Signed)
LMTC./fim 

## 2017-06-26 ENCOUNTER — Encounter: Payer: Self-pay | Admitting: Rheumatology

## 2017-06-26 ENCOUNTER — Ambulatory Visit (INDEPENDENT_AMBULATORY_CARE_PROVIDER_SITE_OTHER): Payer: BLUE CROSS/BLUE SHIELD | Admitting: Rheumatology

## 2017-06-26 VITALS — BP 131/85 | HR 86 | Resp 14 | Ht 66.5 in | Wt 190.0 lb

## 2017-06-26 DIAGNOSIS — F3289 Other specified depressive episodes: Secondary | ICD-10-CM | POA: Diagnosis not present

## 2017-06-26 DIAGNOSIS — G8929 Other chronic pain: Secondary | ICD-10-CM

## 2017-06-26 DIAGNOSIS — G4709 Other insomnia: Secondary | ICD-10-CM | POA: Diagnosis not present

## 2017-06-26 DIAGNOSIS — Z8659 Personal history of other mental and behavioral disorders: Secondary | ICD-10-CM | POA: Diagnosis not present

## 2017-06-26 DIAGNOSIS — R5383 Other fatigue: Secondary | ICD-10-CM | POA: Diagnosis not present

## 2017-06-26 DIAGNOSIS — M5136 Other intervertebral disc degeneration, lumbar region: Secondary | ICD-10-CM

## 2017-06-26 DIAGNOSIS — M19041 Primary osteoarthritis, right hand: Secondary | ICD-10-CM

## 2017-06-26 DIAGNOSIS — Z96642 Presence of left artificial hip joint: Secondary | ICD-10-CM | POA: Diagnosis not present

## 2017-06-26 DIAGNOSIS — Z8639 Personal history of other endocrine, nutritional and metabolic disease: Secondary | ICD-10-CM | POA: Diagnosis not present

## 2017-06-26 DIAGNOSIS — M797 Fibromyalgia: Secondary | ICD-10-CM | POA: Diagnosis not present

## 2017-06-26 DIAGNOSIS — Z8679 Personal history of other diseases of the circulatory system: Secondary | ICD-10-CM | POA: Diagnosis not present

## 2017-06-26 DIAGNOSIS — Z8669 Personal history of other diseases of the nervous system and sense organs: Secondary | ICD-10-CM | POA: Diagnosis not present

## 2017-06-26 DIAGNOSIS — M19042 Primary osteoarthritis, left hand: Secondary | ICD-10-CM

## 2017-06-26 DIAGNOSIS — N3010 Interstitial cystitis (chronic) without hematuria: Secondary | ICD-10-CM | POA: Diagnosis not present

## 2017-06-26 DIAGNOSIS — M542 Cervicalgia: Secondary | ICD-10-CM

## 2017-06-26 DIAGNOSIS — M7062 Trochanteric bursitis, left hip: Secondary | ICD-10-CM

## 2017-06-26 MED ORDER — DICLOFENAC SODIUM 1 % TD GEL: TRANSDERMAL | 2 refills | Status: DC

## 2017-06-26 MED ORDER — CITALOPRAM HYDROBROMIDE 20 MG PO TABS: 20.0000 mg | ORAL_TABLET | Freq: Every day | ORAL | 1 refills | Status: DC

## 2017-06-26 MED ORDER — ZOLPIDEM TARTRATE 10 MG PO TABS: 10.0000 mg | ORAL_TABLET | Freq: Every evening | ORAL | 0 refills | Status: DC | PRN

## 2017-06-26 NOTE — Telephone Encounter (Signed)
LMTC./fim 

## 2017-06-27 ENCOUNTER — Other Ambulatory Visit: Payer: Self-pay | Admitting: Rheumatology

## 2017-06-27 DIAGNOSIS — F3289 Other specified depressive episodes: Secondary | ICD-10-CM

## 2017-07-04 ENCOUNTER — Ambulatory Visit (INDEPENDENT_AMBULATORY_CARE_PROVIDER_SITE_OTHER): Payer: BLUE CROSS/BLUE SHIELD | Admitting: Physician Assistant

## 2017-07-04 VITALS — BP 100/74 | HR 84 | Temp 98.6°F | Ht 66.0 in | Wt 185.0 lb

## 2017-07-04 DIAGNOSIS — E8881 Metabolic syndrome: Secondary | ICD-10-CM

## 2017-07-04 DIAGNOSIS — E038 Other specified hypothyroidism: Secondary | ICD-10-CM

## 2017-07-04 DIAGNOSIS — E669 Obesity, unspecified: Secondary | ICD-10-CM

## 2017-07-04 DIAGNOSIS — Z6830 Body mass index (BMI) 30.0-30.9, adult: Secondary | ICD-10-CM

## 2017-07-04 DIAGNOSIS — E88819 Insulin resistance, unspecified: Secondary | ICD-10-CM | POA: Insufficient documentation

## 2017-07-04 DIAGNOSIS — E559 Vitamin D deficiency, unspecified: Secondary | ICD-10-CM | POA: Diagnosis not present

## 2017-07-04 DIAGNOSIS — E7849 Other hyperlipidemia: Secondary | ICD-10-CM

## 2017-07-04 HISTORY — PX: EPIDURAL BLOCK INJECTION: SHX1516

## 2017-07-04 HISTORY — DX: Metabolic syndrome: E88.81

## 2017-07-04 HISTORY — DX: Insulin resistance, unspecified: E88.819

## 2017-07-04 MED ORDER — VITAMIN D (ERGOCALCIFEROL) 1.25 MG (50000 UNIT) PO CAPS: 50000.0000 [IU] | ORAL_CAPSULE | ORAL | 0 refills | Status: DC

## 2017-07-04 NOTE — Progress Notes (Signed)
Office: [phone]  /  Fax: [phone]   HPI:   Chief Complaint: OBESITY Molly Giles is here to discuss her progress with her obesity treatment plan. She is on the keep a food journal with 400 calories and 35 grams of protein for lunch and follow the Category 2 plan and is following her eating plan approximately 95 % of the time. She states she is exercising walking and yoga 10 minutes 5 times per week. Molly Giles continues to do well with weight loss. She has been adding variety to her meals and has been getting her protein requirement of the day. She would like vacation eating strategies.  Her weight is 185 lb (83.9 kg) today and has had a weight loss of 2 pounds over a period of 3 weeks since her last visit. She has lost 19 lbs since starting treatment with [name].  Vitamin D deficiency Molly Giles has a diagnosis of vitamin D deficiency. She is currently taking prescription Vit D and denies nausea, vomiting or muscle weakness.  Hyperlipidemia Molly Giles has hyperlipidemia and has been trying to improve her cholesterol levels with intensive lifestyle modification including a low saturated fat diet, exercise and weight loss. She is on Crestor and denies any chest pain, claudication or myalgias.  Hypothyroidism Molly Giles has a diagnosis of hypothyroidism. She is on levothyroxine. She denies any symptoms, hot or cold intolerance, palpitations or fatigue.  Insulin Resistance Molly Giles has a diagnosis of insulin resistance based on her elevated fasting insulin level >5. Although Molly Giles's blood glucose readings are still under good control, insulin resistance puts her at greater risk of metabolic syndrome and diabetes. She is not taking metformin currently and continues to work on diet and exercise to decrease risk of diabetes. She denies polyphagia.  ALLERGIES: Allergies  Allergen Reactions  . Sulfa Antibiotics Itching  . Codeine Nausea Only    "head nausea"   . Lyrica [Pregabalin] Other (See Comments)   Dizziness, nausea  . Betadine [Povidone Iodine] Rash    burning  . Hibiclens [Chlorhexidine Gluconate] Other (See Comments)    Burning  . Latex Itching and Rash    burning  . Wellbutrin [Bupropion] Anxiety    MEDICATIONS: Current Outpatient Prescriptions on File Prior to Visit  Medication Sig Dispense Refill  . citalopram (CELEXA) 20 MG tablet Take 1 tablet (20 mg total) by mouth daily. 90 tablet 1  . diclofenac sodium (VOLTAREN) 1 % GEL Apply 3 gm to 3 large joints up to 3 times a day.Dispense 3 tubes with 3 refills. 3 Tube 2  . eletriptan (RELPAX) 40 MG tablet Take 1 tablet (40 mg total) by mouth as needed for migraine. may repeat in 2 hours if necessary 10 tablet 0  . estrogen, conjugated,-medroxyprogesterone (PREMPRO) 0.3-1.5 MG tablet Take 1 tablet by mouth at bedtime. 30 tablet 3  . ibuprofen (ADVIL,MOTRIN) 200 MG tablet Take 800 mg by mouth.     . levothyroxine (SYNTHROID, LEVOTHROID) 125 MCG tablet TAKE 1/2 TABLET BY MOUTH DAILY 90 tablet 1  . loratadine (CLARITIN) 10 MG tablet Take 10 mg by mouth daily.    Marland Kitchen nystatin-triamcinolone (MYCOLOG II) cream Apply 1 application topically 2 (two) times daily.     . prednisoLONE acetate (PRED FORTE) 1 % ophthalmic suspension Place 1 drop into both eyes daily.    . Probiotic Product ([place]) Take by mouth.    . rosuvastatin (CRESTOR) 10 MG tablet Take 1 tablet (10 mg total) by mouth daily. 90 tablet 3  . traMADol (ULTRAM)  50 MG tablet Take 2 tablets (100 mg total) by mouth 2 (two) times daily. 120 tablet 0  . Vitamin D, Ergocalciferol, (DRISDOL) 50000 units CAPS capsule Take 1 capsule (50,000 Units total) by mouth every 7 (seven) days. 4 capsule 0  . zolpidem (AMBIEN) 10 MG tablet Take 1 tablet (10 mg total) by mouth at bedtime as needed. 90 tablet 0   No current facility-administered medications on file prior to visit.     PAST MEDICAL HISTORY: Past Medical History:  Diagnosis Date  . Allergic rhinoconjunctivitis  07/02/2015  . Arthritis   . Benign positional vertigo 04/2015   Responded well to [facility]  . Bruxism (teeth grinding)   . Chronic migraine 02/25/2016   Per Dr Jaynee Eagles review in notes from [facility] from September 2015. Showed total headache days last month 18. Severe headache days 7 days. Moderate headache days 5 days. Mild headache days last month sick days. Days without headache last month 10 days. Symptoms associated with photophobia, phonophobia, osmophobia, neck pain, dizziness, jaw pain, nasal congestion, vision disturbances, tingling and numbness, weakness and worsening with activity. Each headache attack last 3 hours depending on treatment in severity. Left side, the right side, easier side, the frontal area in the back of the head. Characterized as throbbing, pressure, tightness, squeezing, stabbing and burning   . Chronic migraine w/o aura w/o status migrainosus, not intractable 07/14/2015  . DDD (degenerative disc disease), lumbar   . Degenerative cervical disc   . Depression   . Dysrhythmia    seen by dr Wynonia Lawman- not a problem since she has been on Bystolic  . Essential hypertension 05/15/2013  . Family history of adverse reaction to anesthesia    Brother- N/V  . Family history of premature CAD 05/15/2013  . Fibromyalgia   . GERD (gastroesophageal reflux disease) 12/16/2014  . H/O seasonal allergies   . Hearing loss of both ears 07/27/2015   mild to borderline moderate low frequency hearing loss improving to within normal limits bilaterally on audiology testing at [facility] in November 2016.    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 History of irritable bowel syndrome 02/15/2016  . History of migraine headaches 05/15/2013   Per Dr Jaynee Eagles review in notes from [facility] from September 2015. Showed total headache days last month 18. Severe headache days 7 days. Moderate headache days  5 days. Mild headache days last month sick days. Days without headache last month 10 days. Symptoms associated with photophobia, phonophobia, osmophobia, neck pain, dizziness, jaw pain, nasal congestion, vision disturbances, tingling and numbness, weakness and worsening with activity. Each headache attack last 3 hours depending on treatment in severity. Left side, the right side, easier side, the frontal area in the back of the head. Characterized as throbbing, pressure, tightness, squeezing, stabbing and burning    . Hx of bad fall 02/2015   Severe Facial/head trauma without fracture  . Hyperlipidemia 1998  . Hypertension 2004  . Hypothyroidism   . Impairment of balance 02/2015   Consequent of postconcussive syndrome  . Interstitial cystitis   . 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 Migraine 1962   Chronic Migraines  . Mood disorder ([place])   . Musculoskeletal neck pain 07/14/2015  . Normal coronary arteries 05/14/2014  . Osteoarthritis of left hip 11/01/2015   MRI order by Dr Alvan Dame (ortho) 10/2015 showed significant arthritis of left hip joint with cystic changes in femoral  head c/w osteoarthritis  . 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 Overweight   . Pain in joint of left shoulder 11/09/2016  . Palpitations 05/15/2013   [facility] manages  . Periodic limb movement sleep disorder 03/28/2017  . Perirectal cyst 05/07/2016  . PONV (postoperative nausea and vomiting)   . Post concussion syndrome 06/06/2015  . Post concussive syndrome 07/14/2015   Ms Lipinski's post-concussive syndrome manifesting in vertigo and headache, mood changes, poor balance, dizziness, and decreased concentration per Dr Jaynee Eagles at [facility].   Marland Kitchen Posterior vitreous detachment of right eye 2014  . Shortness of breath dyspnea    with exertion  . Thyroid nodule 08/11/2009   Findings: The thyroid gland is within normal limits in size.  The gland is diffusely  inhomogeneous. A small solid nodule is noted in the lower pole  medially on the right of 7 x 6 x 8 mm. A small solid nodule is noted inferiorly on the left of 3 x 3 x 4 mm.  IMPRESSION:  The thyroid gland is within normal limits in size with only small solid nodules present, the largest of only 8 mm in diameter on the right.    . Vasomotor symptoms due to menopause 04/19/2017    PAST SURGICAL HISTORY: Past Surgical History:  Procedure Laterality Date  . Bladder dilitation     x 3  . BREAST BIOPSY  2011   Benign histology  . CARDIOVASCULAR STRESS TEST  2000   Unremarkable per pt report  . CARPOMETACARPAL JOINT ARTHROTOMY Right 2011  . COLONOSCOPY    . COLONOSCOPY WITH PROPOFOL N/A 04/21/2015   Procedure: COLONOSCOPY WITH PROPOFOL;  Surgeon: Carol Ada, MD;  Location: [place];  Service: Endoscopy;  Laterality: N/A;  . EPIDURAL BLOCK INJECTION Left 04/12/2016   Left Medial Nerve Block and Left L5 ramus block, Dr Suella Broad   . EPIDURAL BLOCK INJECTION  03/21/2016   Left L3-4 medial branch block and Left L5 & dorsal ramus block   . EPIDURAL BLOCK INJECTION N/A 10/25/2016   Suella Broad, MD. Lumbar medial branch block  . EPIDURAL BLOCK INJECTION N/A 02/09/2017   Suella Broad, MD.  Bilateral L3/4 medial branch block, bilateral L5 dorsal ramus block  . FECAL TRANSPLANT  04/21/2015   Procedure: FECAL TRANSPLANT;  Surgeon: Carol Ada, MD;  Location: [place];  Service: Endoscopy;;  . HIP ARTHROPLASTY    . INJECTION HIP INTRA ARTICULAR Left 11/2015   for OA by Dr Suella Broad  . OTHER SURGICAL HISTORY Left 2016   Left L3/L4 medial nerve block and Left L5 Dorsal Ramus block Dr Mickel Duhamel  . TOTAL HIP ARTHROPLASTY Left 07/25/2016   Procedure: LEFT TOTAL HIP ARTHROPLASTY ANTERIOR APPROACH;  Surgeon: Paralee Cancel, MD;  Location: [facility];  Service: Orthopedics;  Laterality: Left;    SOCIAL HISTORY: Social History  Substance Use Topics  . Smoking status: Passive Smoke Exposure -  Never Smoker  . Smokeless tobacco: Never Used     Comment: As a child  . Alcohol use No     Comment: No use in 30 years.    FAMILY HISTORY: Family History  Problem Relation Age of Onset  . Alzheimer's disease Mother   . Hyperlipidemia Mother   . Hypertension Mother   . Osteoporosis Mother   . Parkinson's disease Mother   . Migraines Sister   . Allergies Sister   . Hypertension Sister   . Hyperlipidemia Brother   . Cardiomyopathy  Brother   . Diabetes type II Brother   . Kidney disease Brother   . Asthma Brother   . Heart disease Brother   . Hypertension Brother   . Hyperlipidemia Brother   . Kidney disease Brother   . Heart disease Father   . Hyperlipidemia Father   . Hypertension Father   . Aortic aneurysm Father   . Early death Father 67  . Diabetes type II Unknown   . Breast cancer Maternal Aunt     ROS: Review of Systems  Constitutional: Positive for weight loss. Negative for malaise/fatigue.       Negative hot/cold intolerance  Cardiovascular: Negative for chest pain, palpitations and claudication.  Gastrointestinal: Negative for nausea and vomiting.  Musculoskeletal: Negative for myalgias.       Negative muscle weakness  Endo/Heme/Allergies:       Negative polyphagia    PHYSICAL EXAM: Blood pressure 100/74, pulse 84, temperature 98.6 F (37 C), temperature source Oral, height 5\' 6"  (1.676 m), weight 185 lb (83.9 kg), SpO2 98 %. Body mass index is 29.86 kg/m. Physical Exam  Constitutional: She is oriented to person, place, and time. She appears well-developed and well-nourished.  Cardiovascular: Normal rate.   Pulmonary/Chest: Effort normal.  Musculoskeletal: Normal range of motion.  Neurological: She is oriented to person, place, and time.  Skin: Skin is warm and dry.  Psychiatric: She has a normal mood and affect. Her behavior is normal.  Vitals reviewed.   RECENT LABS AND TESTS: BMET    Component Value Date/Time   NA 142 03/22/2017 1057   K  4.8 03/22/2017 1057   CL 103 03/22/2017 1057   CO2 25 03/22/2017 1057   GLUCOSE 76 03/22/2017 1057   GLUCOSE 132 (H) 07/26/2016 0419   BUN 7 (L) 03/22/2017 1057   CREATININE 0.79 03/22/2017 1057   CREATININE 0.74 02/24/2016 1158   CALCIUM 9.4 03/22/2017 1057   GFRNONAA 81 03/22/2017 1057   GFRNONAA 88 02/24/2016 1158   GFRAA 93 03/22/2017 1057   GFRAA >89 02/24/2016 1158   Lab Results  Component Value Date   HGBA1C 5.3 03/22/2017   Lab Results  Component Value Date   INSULIN 10.5 03/22/2017   CBC    Component Value Date/Time   WBC 6.6 03/22/2017 1057   WBC 15.6 (H) 07/26/2016 0419   RBC 4.40 03/22/2017 1057   RBC 3.73 (L) 07/26/2016 0419   HGB 12.9 03/22/2017 1057   HCT 39.6 03/22/2017 1057   PLT 236 07/26/2016 0419   MCV 90 03/22/2017 1057   MCH 29.3 03/22/2017 1057   MCH 29.2 07/26/2016 0419   MCHC 32.6 03/22/2017 1057   MCHC 32.4 07/26/2016 0419   RDW 14.3 03/22/2017 1057   LYMPHSABS 2.1 03/22/2017 1057   EOSABS 0.1 03/22/2017 1057   BASOSABS 0.0 03/22/2017 1057   Iron/TIBC/Ferritin/ %Sat No results found for: IRON, TIBC, FERRITIN, IRONPCTSAT Lipid Panel     Component Value Date/Time   CHOL 152 03/22/2017 1057   TRIG 120 03/22/2017 1057   HDL 53 03/22/2017 1057   CHOLHDL 3.9 12/14/2014 1036   VLDL 26 12/14/2014 1036   LDLCALC 75 03/22/2017 1057   Hepatic Function Panel     Component Value Date/Time   PROT 6.5 03/22/2017 1057   ALBUMIN 4.4 03/22/2017 1057   AST 33 03/22/2017 1057   ALT 46 (H) 03/22/2017 1057   ALKPHOS 90 03/22/2017 1057   BILITOT 0.3 03/22/2017 1057      Component Value Date/Time  TSH 3.190 03/22/2017 1057   TSH 4.000 01/25/2017 1109   TSH 3.70 02/24/2016 1158    ASSESSMENT AND PLAN: Vitamin D deficiency - Plan: Comprehensive metabolic panel, Hemoglobin A1c, Insulin, random, VITAMIN D 25 Hydroxy (Vit-D Deficiency, Fractures), Vitamin D, Ergocalciferol, (DRISDOL) 50000 units CAPS capsule  Other hyperlipidemia - Plan:  Comprehensive metabolic panel, Hemoglobin A1c, Insulin, random, Lipid Panel With LDL/HDL Ratio  Other specified hypothyroidism - Plan: T3, T4, free, TSH  Insulin resistance  Class 1 obesity with serious comorbidity and body mass index (BMI) of 30.0 to 30.9 in adult, unspecified obesity type - Starting BMI greater then 30  PLAN:  Vitamin D Deficiency Fatin was informed that low vitamin D levels contributes to fatigue and are associated with obesity, breast, and colon cancer. Jennaya agrees to continue taking prescription Vit D @50 ,000 IU every week #4 and we will refill for 1 month. She will follow up for routine testing of vitamin D, at least 2-3 times per year. She was informed of the risk of over-replacement of vitamin D and agrees to not increase her dose unless he discusses this with [name] first. Molly Giles agrees to follow up with our clinic in 3 weeks.  Hyperlipidemia Molly Giles was informed of the American Heart Association Guidelines emphasizing intensive lifestyle modifications as the first line treatment for hyperlipidemia. We discussed many lifestyle modifications today in depth, and Jailani will continue to work on decreasing saturated fats such as fatty red meat, butter and many fried foods. She will also increase vegetables and lean protein in her diet and continue to work on exercise and weight loss efforts. Molly Giles agrees to continue taking her medication as prescribed. We will recheck labs and she agrees to follow up with our clinic in 3 weeks.  Hypothyroidism Molly Giles was informed of the importance of good thyroid control to help with weight loss efforts. She was also informed that supertheraputic thyroid levels are dangerous and will not improve weight loss results. Molly Giles agrees to continue taking her medication as prescribed. We will check labs and she agrees to follow up with our clinic in 3 weeks.  Insulin Resistance Molly Giles will continue to work on weight loss, diet, exercise, and decreasing  simple carbohydrates in her diet to help decrease the risk of diabetes. We dicussed metformin including benefits and risks. She was informed that eating too many simple carbohydrates or too many calories at one sitting increases the likelihood of GI side effects. Molly Giles declined metformin for now and prescription was not written today. We will recheck labs and Molly Giles agrees to follow up with our clinic in 3 weeks as directed to monitor her progress.  Obesity Molly Giles is currently in the action stage of change. As such, her goal is to continue with weight loss efforts She has agreed to change to keep a food journal with 1200 calories and 80 grams of protein daily Molly Giles has been instructed to work up to a goal of 150 minutes of combined cardio and strengthening exercise per week for weight loss and overall health benefits. We discussed the following Behavioral Modification Strategies today: increasing lean protein intake and work on meal planning and easy cooking plans and vacation eating strategies.   Molly Giles has agreed to follow up with our clinic in 3 weeks. She was informed of the importance of frequent follow up visits to maximize her success with intensive lifestyle modifications for her multiple health conditions.  Molly Giles, am acting as transcriptionist for Lacy Duverney, PA-C  I have reviewed  the above documentation for accuracy and completeness, and I agree with the above. -Lacy Duverney, PA-C  I have reviewed the above note and agree with the plan. -Dennard Nip, MD     Today's visit was # 7 out of 22.  Starting weight: 204 lbs Starting date: 03/22/17 Today's weight : 185 lbs  Today's date: 07/04/2017 Total lbs lost to date: 37 (Patients must lose 7 lbs in the first 6 months to continue with counseling)   ASK: We discussed the diagnosis of obesity with Molly Giles today and Molly Giles agreed to give [name] permission to discuss obesity behavioral modification therapy  today.  ASSESS: Jalia has the diagnosis of obesity and her BMI today is 8.87 Molly Giles is in the action stage of change   ADVISE: Yasemin was educated on the multiple health risks of obesity as well as the benefit of weight loss to improve her health. She was advised of the need for long term treatment and the importance of lifestyle modifications.  AGREE: Multiple dietary modification options and treatment options were discussed and  Molly Giles agreed to keep a food journal with 1200 calories and 80 grams of protein daily We discussed the following Behavioral Modification Strategies today: increasing lean protein intake and work on meal planning and easy cooking plans and vacation eating strategies

## 2017-07-05 ENCOUNTER — Ambulatory Visit (INDEPENDENT_AMBULATORY_CARE_PROVIDER_SITE_OTHER): Payer: BLUE CROSS/BLUE SHIELD | Admitting: Family Medicine

## 2017-07-05 ENCOUNTER — Encounter: Payer: Self-pay | Admitting: Family Medicine

## 2017-07-05 VITALS — BP 110/78 | HR 77 | Temp 98.2°F | Ht 67.0 in | Wt 189.0 lb

## 2017-07-05 DIAGNOSIS — R5383 Other fatigue: Secondary | ICD-10-CM

## 2017-07-05 DIAGNOSIS — I10 Essential (primary) hypertension: Secondary | ICD-10-CM

## 2017-07-05 DIAGNOSIS — N951 Menopausal and female climacteric states: Secondary | ICD-10-CM

## 2017-07-05 LAB — COMPREHENSIVE METABOLIC PANEL
A/G RATIO: 2 (ref 1.2–2.2)
ALBUMIN: 4.3 g/dL (ref 3.6–4.8)
ALT: 17 IU/L (ref 0–32)
AST: 16 IU/L (ref 0–40)
Alkaline Phosphatase: 86 IU/L (ref 39–117)
BUN/Creatinine Ratio: 17 (ref 12–28)
BUN: 13 mg/dL (ref 8–27)
Bilirubin Total: 0.3 mg/dL (ref 0.0–1.2)
CALCIUM: 9.2 mg/dL (ref 8.7–10.3)
CHLORIDE: 107 mmol/L — AB (ref 96–106)
CO2: 25 mmol/L (ref 20–29)
Creatinine, Ser: 0.75 mg/dL (ref 0.57–1.00)
GFR, EST AFRICAN AMERICAN: 99 mL/min/{1.73_m2} (ref >59)
GFR, EST NON AFRICAN AMERICAN: 86 mL/min/{1.73_m2} (ref >59)
GLOBULIN, TOTAL: 2.1 g/dL (ref 1.5–4.5)
Glucose: 83 mg/dL (ref 65–99)
Potassium: 4.3 mmol/L (ref 3.5–5.2)
Sodium: 142 mmol/L (ref 134–144)
TOTAL PROTEIN: 6.4 g/dL (ref 6.0–8.5)

## 2017-07-05 LAB — HEMOGLOBIN A1C
Est. average glucose Bld gHb Est-mCnc: 103 mg/dL
Hgb A1c MFr Bld: 5.2 % (ref 4.8–5.6)

## 2017-07-05 LAB — LIPID PANEL WITH LDL/HDL RATIO
CHOLESTEROL TOTAL: 150 mg/dL (ref 100–199)
HDL: 48 mg/dL (ref >39)
LDL Calculated: 78 mg/dL (ref 0–99)
LDl/HDL Ratio: 1.6 ratio (ref 0.0–3.2)
Triglycerides: 119 mg/dL (ref 0–149)
VLDL CHOLESTEROL CAL: 24 mg/dL (ref 5–40)

## 2017-07-05 LAB — T4, FREE: FREE T4: 1.39 ng/dL (ref 0.82–1.77)

## 2017-07-05 LAB — VITAMIN D 25 HYDROXY (VIT D DEFICIENCY, FRACTURES): Vit D, 25-Hydroxy: 48.5 ng/mL (ref 30.0–100.0)

## 2017-07-05 LAB — TSH: TSH: 3.88 u[IU]/mL (ref 0.450–4.500)

## 2017-07-05 LAB — T3: T3 TOTAL: 107 ng/dL (ref 71–180)

## 2017-07-05 LAB — INSULIN, RANDOM: INSULIN: 11.8 u[IU]/mL (ref 2.6–24.9)

## 2017-07-05 NOTE — Assessment & Plan Note (Signed)
Established problem that has improved.  Will continue HRT therapy with trial of patient-assessed reduction in Prempro dose of half a 0.3/1.5mg  tablet daily trial to see if fatigue symptoms improve without significant resumption of vasomotor hot flashes.   If hot flashes resume at degree discomforting to patient, Molly Giles is to return to her taking one whole tablet dailly.   Plan to continue HRT for one year then trial off, then q 6 - 12 months thereafter if excessive vasomotor symptoms persist.

## 2017-07-05 NOTE — Patient Instructions (Addendum)
Blood pressure looks great with your lifestule changes and wieght reduction.  Good Job. Experiment with using half tablet of your Prempro to see if the chnge in dose will reduce your fatigue while still controlling the hot flashes.   Your lab work from Dr Leafy Ro looks very good.  Your LDL(Bad) cholesterol was 75 (Great). Goal is LDL less than 100.  Your triglycerides were 119 (Goal is less than 150).   I will see you each year unless you has a problem you would like to discuss.   I recommend staying on the Prempro for about a year, then we can discuss a trial off of it to see if you still need it for hot flashes.   Please see your GYN to discuss screening for cervical cancer with a Pap smear and HPV virus.

## 2017-07-05 NOTE — Assessment & Plan Note (Signed)
Established problem that has improved.  Mrs Molly Giles's blood pressure is in target range just using weight reduction per recent 24 hour ambulatory measurements.  No medications needed at this time Maintenance phase of change of lifestyle with weight reduction.  Following up with Dr Leafy Ro.

## 2017-07-05 NOTE — Progress Notes (Signed)
   Subjective:    Patient ID: Molly Giles, female    DOB: Mar 19, 1956, 61 y.o.   MRN: [id number] RAVENNE WAYMENT is alone Sources of clinical information for visit is/are patient and past medical records. Nursing assessment for this office visit was reviewed with the patient for accuracy and revision.  Depression screen PHQ 2/9 07/05/2017  Decreased Interest 0  Down, Depressed, Hopeless 0  PHQ - 2 Score 0  Altered sleeping -  Tired, decreased energy -  Change in appetite -  Feeling bad or failure about yourself  -  Trouble concentrating -  Moving slowly or fidgety/restless -  Suicidal thoughts -  PHQ-9 Score -  Difficult doing work/chores -   Fall Risk  07/02/2015 07/02/2015 04/08/2015 01/29/2015  Falls in the past year? - No No No  Risk for fall due to : Impaired balance/gait History of fall(s) - -    HPI Problem List Items Addressed This Visit      Medium   Essential hypertension (Chronic) - recent Ambulatory BP montioring while off all antihtn meds found means 24 BP 11/72 - No chest pain, no shortness of breath - on no antihtn meds currently - Active participation in [facility] with significant intentional weight loss     Unprioritized   Vasomotor symptoms due to menopause - Primary - Start of Prempro 0.3/1.5 mg daily for hot flashes that were impairing sleep and daytime dfuntioning - Since start of HRT 05/10/17 , she has had no hot flashes. - Denies leg swelling, increase in headache, numbness or weakness in limbs or face.  - No vaginal bleeding   Fatigue - Onset around time of starting Prempro - Able to complete tasks, lower motivation to start tasks - Sleep is adequate.  Recent Polysomnograpy testing found no OSA, but PMLS associated with awakenings that could be associated with sleep quality impairment.  - No DOE.  PHQ2 = 0.   - Recent CMP and TSH, Vit D, Vit B12 levels by Dr Leafy Ro were within normal limits.  - Not much aerobic exercise.  Has incorporated  Yoga portures for arthritis pain most nights at home.  - Traveling to [facility] tomorrow.   Symptoms Fever: yes,   Night sweats: no  Weight loss: yes, intnentional   Exertional chest pain: no  Dyspnea: no  Cough: no  Hemoptysis: no  New medications: yes, Prempro  Leg swelling: no  Orthopnea: no  PND: no  Melena: no  Adenopathy: yes,   Severe snoring: yes, soft sound. No gasping.  Recent negative sleep study for OSA  Daytime sleepiness: no         SH: no smoking  Review of Systems See HPI    Objective:   Physical Exam BP 110/78   Pulse 77   Temp 98.2 F (36.8 C) (Oral)   Ht 5\' 7"  (1.702 m)   Wt 189 lb (85.7 kg)   SpO2 98%   BMI 29.60 kg/m  VS reviewed GEN: Alert, Cooperative, Groomed, NAD COR: RRR, No M/G/R, No JVD, Normal PMI size and location LUNGS: BCTA, No Acc mm use, speaking in full sentences EXT: No peripheral leg edema. Gait: Normal speed, No significant path deviation, Step through +,  Psych: Normal affect/thought/speech/language    Assessment & Plan:  See problem list

## 2017-07-05 NOTE — Assessment & Plan Note (Signed)
New problem.  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 No concerning symptoms on review of symptoms Will try trial of reducing dose of Prempro (see vasomotor problem) given temporal relation to onset of symptoms with initiation of HRT. Recommendation of addition of aerobic activities like walking to current Yoga regime.  Molly Giles is to return to clinic if fatigue worsens

## 2017-07-10 ENCOUNTER — Encounter: Payer: Self-pay | Admitting: Family Medicine

## 2017-07-11 ENCOUNTER — Encounter: Payer: Self-pay | Admitting: Rehabilitative and Restorative Service Providers"

## 2017-07-11 ENCOUNTER — Telehealth: Payer: Self-pay | Admitting: Rehabilitative and Restorative Service Providers"

## 2017-07-11 NOTE — Therapy (Signed)
[facility] [address] [place], [place], [address] Phone: [phone]   Fax:  [phone]  Patient Details  Name: Molly Giles MRN: [id number] Date of Birth: [birth year] Referring Provider:  No ref. provider found  Encounter Date: last encounter 06/25/17   PHYSICAL THERAPY DISCHARGE SUMMARY  Visits from Start of Care: 4  Current functional level related to goals / functional outcomes:     PT Short Term Goals - 05/28/17 2016      PT SHORT TERM GOAL #1   Title STGs=LTGs         PT Long Term Goals - 06/25/17 9622      PT LONG TERM GOAL #1   Title The patient will return demo HEP for habituation, gaze.   Time 4   Period Weeks   Status On-going     PT LONG TERM GOAL #2   Title The patient will have negative positional testing for nystagmus and dizziness.   Baseline Patient notes no spinning and no nystagmus viewed in room light, however she still has a mild sensation of movement in her head.   Time 4   Period Weeks   Status Partially Met     PT LONG TERM GOAL #3   Title The patient will verbalize understanding of future mgmt of BPPV prior to d/c.   Baseline Patient is able to verbalize understanding of using habituation when tolerated for positional symptoms.    Time 4   Period Weeks   Status Achieved     PT LONG TERM GOAL #4   Title The patient will return demo HEP for neck stabilization and stretching secondary to h/o chronic migraines with neck tension.   Baseline Patient verbalizes understanding of home program.   Time 4   Period Weeks   Status Achieved     PT LONG TERM GOAL #5   Title Improve DHI from 76% to < or equal to 50% to demo dec'd self perception of dizziness.   Time 4   Period Weeks        Remaining deficits: Patient and PT followed up by phone.  She no longer has spinning vertigo, however does note occasional dizziness with fast head motion (not provoked every time she moves).    Education / Equipment: Home program for gaze, habituation.   Plan: Patient agrees to discharge.  Patient goals were partially met. Patient is being discharged due to meeting the stated rehab goals.  ?????         Thank you for the referral of this patient. Rudell Cobb, MPT   Kass Herberger 07/11/2017, 10:38 AM  [facility] [address] [place], [place], [address] Phone: [phone]   Fax:  [phone]

## 2017-07-11 NOTE — Telephone Encounter (Signed)
The vertigo is improved per report, denying no positional sensitivities.  She still notes occasional sense of dizziness with head motion, but "it's nothing like it was".  It is not every day and varies.  She feels it may be tied to her migraines.   Patient feels able to continue HEP to manage symptoms noting her daily activities are not hindered from symptoms at this time.  Fue Cervenka, PT

## 2017-07-15 ENCOUNTER — Encounter (INDEPENDENT_AMBULATORY_CARE_PROVIDER_SITE_OTHER): Payer: Self-pay | Admitting: Family Medicine

## 2017-07-16 NOTE — Telephone Encounter (Signed)
Please address

## 2017-07-23 ENCOUNTER — Ambulatory Visit (INDEPENDENT_AMBULATORY_CARE_PROVIDER_SITE_OTHER): Payer: BLUE CROSS/BLUE SHIELD | Admitting: Physician Assistant

## 2017-07-23 VITALS — BP 103/71 | HR 73 | Temp 97.5°F | Ht 67.0 in | Wt 182.0 lb

## 2017-07-23 DIAGNOSIS — E669 Obesity, unspecified: Secondary | ICD-10-CM

## 2017-07-23 DIAGNOSIS — Z6830 Body mass index (BMI) 30.0-30.9, adult: Secondary | ICD-10-CM | POA: Diagnosis not present

## 2017-07-23 DIAGNOSIS — E559 Vitamin D deficiency, unspecified: Secondary | ICD-10-CM | POA: Diagnosis not present

## 2017-07-23 NOTE — Progress Notes (Signed)
Office: [phone]  /  Fax: [phone]   HPI:   Chief Complaint: OBESITY Molly Giles is here to discuss her progress with her obesity treatment plan. She is on the keep a food journal with 1200 calories and 80 grams of protein daily and is following her eating plan approximately 85 % of the time. She states she is walking for 15 minutes 3 times per week. Molly Giles continues to do well with weight loss. Despite being on vacation, she managed to make smarter food choices and control her portions. She would like more variety with her meals.  Her weight is 182 lb (82.6 kg) today and has had a weight loss of 3 pounds over a period of 2 to 3 weeks since her last visit. She has lost 22 lbs since starting treatment with [name].  Vitamin D deficiency Molly Giles has a diagnosis of vitamin D deficiency. She is currently taking prescription Vit D and denies nausea, vomiting or muscle weakness.  ALLERGIES: Allergies  Allergen Reactions  . Sulfa Antibiotics Itching  . Codeine Nausea Only    "head nausea"   . Lyrica [Pregabalin] Other (See Comments)    Dizziness, nausea  . Betadine [Povidone Iodine] Rash    burning  . Hibiclens [Chlorhexidine Gluconate] Other (See Comments)    Burning  . Latex Itching and Rash    burning  . Wellbutrin [Bupropion] Anxiety    MEDICATIONS: Current Outpatient Medications on File Prior to Visit  Medication Sig Dispense Refill  . citalopram (CELEXA) 20 MG tablet Take 1 tablet (20 mg total) by mouth daily. 90 tablet 1  . diclofenac sodium (VOLTAREN) 1 % GEL Apply 3 gm to 3 large joints up to 3 times a day.Dispense 3 tubes with 3 refills. 3 Tube 2  . eletriptan (RELPAX) 40 MG tablet Take 1 tablet (40 mg total) by mouth as needed for migraine. may repeat in 2 hours if necessary 10 tablet 0  . estrogen, conjugated,-medroxyprogesterone (PREMPRO) 0.3-1.5 MG tablet Take 1 tablet by mouth at bedtime. 30 tablet 3  . ibuprofen (ADVIL,MOTRIN) 200 MG tablet Take 800 mg by mouth.     .  levothyroxine (SYNTHROID, LEVOTHROID) 125 MCG tablet TAKE 1/2 TABLET BY MOUTH DAILY 90 tablet 1  . loratadine (CLARITIN) 10 MG tablet Take 10 mg by mouth daily.    Marland Kitchen nystatin-triamcinolone (MYCOLOG II) cream Apply 1 application topically 2 (two) times daily.     . prednisoLONE acetate (PRED FORTE) 1 % ophthalmic suspension Place 1 drop into both eyes daily.    . Probiotic Product ([place]) Take by mouth.    . rosuvastatin (CRESTOR) 10 MG tablet Take 1 tablet (10 mg total) by mouth daily. 90 tablet 3  . traMADol (ULTRAM) 50 MG tablet Take 2 tablets (100 mg total) by mouth 2 (two) times daily. 120 tablet 0  . Vitamin D, Ergocalciferol, (DRISDOL) 50000 units CAPS capsule Take 1 capsule (50,000 Units total) by mouth every 7 (seven) days. 4 capsule 0  . zolpidem (AMBIEN) 10 MG tablet Take 1 tablet (10 mg total) by mouth at bedtime as needed. 90 tablet 0   No current facility-administered medications on file prior to visit.     PAST MEDICAL HISTORY: Past Medical History:  Diagnosis Date  . Allergic rhinoconjunctivitis 07/02/2015  . Arthritis   . Benign positional vertigo 04/2015   Responded well to Vestibular Rehab  . Bruxism (teeth grinding)   . Chronic migraine 02/25/2016   Per Dr Jaynee Eagles review in notes from  [facility] from September 2015. Showed total headache days last month 18. Severe headache days 7 days. Moderate headache days 5 days. Mild headache days last month sick days. Days without headache last month 10 days. Symptoms associated with photophobia, phonophobia, osmophobia, neck pain, dizziness, jaw pain, nasal congestion, vision disturbances, tingling and numbness, weakness and worsening with activity. Each headache attack last 3 hours depending on treatment in severity. Left side, the right side, easier side, the frontal area in the back of the head. Characterized as throbbing, pressure, tightness, squeezing, stabbing and burning   . Chronic migraine  w/o aura w/o status migrainosus, not intractable 07/14/2015  . DDD (degenerative disc disease), lumbar   . Degenerative cervical disc   . Depression   . Dysrhythmia    seen by dr Wynonia Lawman- not a problem since she has been on Bystolic  . Essential hypertension 05/15/2013  . Family history of adverse reaction to anesthesia    Brother- N/V  . Family history of premature CAD 05/15/2013  . Fibromyalgia   . GERD (gastroesophageal reflux disease) 12/16/2014  . H/O seasonal allergies   . Hearing loss of both ears 07/27/2015   mild to borderline moderate low frequency hearing loss improving to within normal limits bilaterally on audiology testing at [facility] in November 2016.    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 History of irritable bowel syndrome 02/15/2016  . History of migraine headaches 05/15/2013   Per Dr Jaynee Eagles review in notes from [facility] from September 2015. Showed total headache days last month 18. Severe headache days 7 days. Moderate headache days 5 days. Mild headache days last month sick days. Days without headache last month 10 days. Symptoms associated with photophobia, phonophobia, osmophobia, neck pain, dizziness, jaw pain, nasal congestion, vision disturbances, tingling and numbness, weakness and worsening with activity. Each headache attack last 3 hours depending on treatment in severity. Left side, the right side, easier side, the frontal area in the back of the head. Characterized as throbbing, pressure, tightness, squeezing, stabbing and burning    . Hx of bad fall 02/2015   Severe Facial/head trauma without fracture  . Hyperlipidemia 1998  . Hypertension 2004  . Hypothyroidism   . Impairment of balance 02/2015   Consequent of postconcussive syndrome  . Interstitial cystitis   . 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 Migraine 1962   Chronic Migraines  . Mood disorder ([place])   .  Musculoskeletal neck pain 07/14/2015  . Normal coronary arteries 05/14/2014  . Osteoarthritis of left hip 11/01/2015   MRI order by Dr Alvan Dame (ortho) 10/2015 showed significant arthritis of left hip joint with cystic changes in femoral head c/w osteoarthritis  . 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 Overweight   . Pain in joint of left shoulder 11/09/2016  . Palpitations 05/15/2013   [facility] manages  . Periodic limb movement sleep disorder 03/28/2017  . Perirectal cyst 05/07/2016  . PONV (postoperative nausea and vomiting)   . Post concussion syndrome 06/06/2015  . Post concussive syndrome 07/14/2015   Ms Bacci's post-concussive syndrome manifesting in vertigo and headache, mood changes, poor balance, dizziness, and decreased concentration per Dr Jaynee Eagles at [facility].   Marland Kitchen Posterior vitreous detachment of right eye 2014  . Shortness of breath dyspnea    with exertion  . Thyroid nodule 08/11/2009   Findings: The thyroid gland is within normal limits  in size.  The gland is diffusely inhomogeneous. A small solid nodule is noted in the lower pole  medially on the right of 7 x 6 x 8 mm. A small solid nodule is noted inferiorly on the left of 3 x 3 x 4 mm.  IMPRESSION:  The thyroid gland is within normal limits in size with only small solid nodules present, the largest of only 8 mm in diameter on the right.    . Vasomotor symptoms due to menopause 04/19/2017    PAST SURGICAL HISTORY: Past Surgical History:  Procedure Laterality Date  . Bladder dilitation     x 3  . BREAST BIOPSY  2011   Benign histology  . CARDIOVASCULAR STRESS TEST  2000   Unremarkable per pt report  . CARPOMETACARPAL JOINT ARTHROTOMY Right 2011  . COLONOSCOPY    . EPIDURAL BLOCK INJECTION Left 04/12/2016   Left Medial Nerve Block and Left L5 ramus block, Dr Suella Broad   . EPIDURAL BLOCK INJECTION  03/21/2016   Left L3-4 medial branch block and Left L5 & dorsal ramus  block   . EPIDURAL BLOCK INJECTION N/A 10/25/2016   Suella Broad, MD. Lumbar medial branch block  . EPIDURAL BLOCK INJECTION N/A 02/09/2017   Suella Broad, MD.  Bilateral L3/4 medial branch block, bilateral L5 dorsal ramus block  . EPIDURAL BLOCK INJECTION N/A 07/04/2017   Suella Broad, MD  . HIP ARTHROPLASTY    . INJECTION HIP INTRA ARTICULAR Left 11/2015   for OA by Dr Suella Broad  . OTHER SURGICAL HISTORY Left 2016   Left L3/L4 medial nerve block and Left L5 Dorsal Ramus block Dr Mickel Duhamel    SOCIAL HISTORY: Social History   Tobacco Use  . Smoking status: Passive Smoke Exposure - Never Smoker  . Smokeless tobacco: Never Used  . Tobacco comment: As a child  Substance Use Topics  . Alcohol use: No    Alcohol/week: 0.0 oz    Comment: No use in 30 years.  . Drug use: No    FAMILY HISTORY: Family History  Problem Relation Age of Onset  . Alzheimer's disease Mother   . Hyperlipidemia Mother   . Hypertension Mother   . Osteoporosis Mother   . Parkinson's disease Mother   . Migraines Sister   . Allergies Sister   . Hypertension Sister   . Hyperlipidemia Brother   . Cardiomyopathy Brother   . Diabetes type II Brother   . Kidney disease Brother   . Asthma Brother   . Heart disease Brother   . Hypertension Brother   . Hyperlipidemia Brother   . Kidney disease Brother   . Heart disease Father   . Hyperlipidemia Father   . Hypertension Father   . Aortic aneurysm Father   . Early death Father 92  . Diabetes type II Unknown   . Breast cancer Maternal Aunt     ROS: Review of Systems  Constitutional: Positive for weight loss.  Gastrointestinal: Negative for nausea and vomiting.  Musculoskeletal:       Negative muscle weakness    PHYSICAL EXAM: Blood pressure 103/71, pulse 73, temperature (!) 97.5 F (36.4 C), temperature source Oral, height 5\' 7"  (1.702 m), weight 182 lb (82.6 kg), SpO2 99 %. Body mass index is 28.51 kg/m. Physical Exam  Constitutional:  She is oriented to person, place, and time. She appears well-developed and well-nourished.  Cardiovascular: Normal rate.  Pulmonary/Chest: Effort normal.  Musculoskeletal: Normal range of motion.  Neurological: She is  oriented to person, place, and time.  Skin: Skin is warm and dry.  Psychiatric: She has a normal mood and affect. Her behavior is normal.  Vitals reviewed.   RECENT LABS AND TESTS: BMET    Component Value Date/Time   NA 142 07/04/2017 1021   K 4.3 07/04/2017 1021   CL 107 (H) 07/04/2017 1021   CO2 25 07/04/2017 1021   GLUCOSE 83 07/04/2017 1021   GLUCOSE 132 (H) 07/26/2016 0419   BUN 13 07/04/2017 1021   CREATININE 0.75 07/04/2017 1021   CREATININE 0.74 02/24/2016 1158   CALCIUM 9.2 07/04/2017 1021   GFRNONAA 86 07/04/2017 1021   GFRNONAA 88 02/24/2016 1158   GFRAA 99 07/04/2017 1021   GFRAA >89 02/24/2016 1158   Lab Results  Component Value Date   HGBA1C 5.2 07/04/2017   HGBA1C 5.3 03/22/2017   Lab Results  Component Value Date   INSULIN 11.8 07/04/2017   INSULIN 10.5 03/22/2017   CBC    Component Value Date/Time   WBC 6.6 03/22/2017 1057   WBC 15.6 (H) 07/26/2016 0419   RBC 4.40 03/22/2017 1057   RBC 3.73 (L) 07/26/2016 0419   HGB 12.9 03/22/2017 1057   HCT 39.6 03/22/2017 1057   PLT 236 07/26/2016 0419   MCV 90 03/22/2017 1057   MCH 29.3 03/22/2017 1057   MCH 29.2 07/26/2016 0419   MCHC 32.6 03/22/2017 1057   MCHC 32.4 07/26/2016 0419   RDW 14.3 03/22/2017 1057   LYMPHSABS 2.1 03/22/2017 1057   EOSABS 0.1 03/22/2017 1057   BASOSABS 0.0 03/22/2017 1057   Iron/TIBC/Ferritin/ %Sat No results found for: IRON, TIBC, FERRITIN, IRONPCTSAT Lipid Panel     Component Value Date/Time   CHOL 150 07/04/2017 1021   TRIG 119 07/04/2017 1021   HDL 48 07/04/2017 1021   CHOLHDL 3.9 12/14/2014 1036   VLDL 26 12/14/2014 1036   LDLCALC 78 07/04/2017 1021   Hepatic Function Panel     Component Value Date/Time   PROT 6.4 07/04/2017 1021   ALBUMIN  4.3 07/04/2017 1021   AST 16 07/04/2017 1021   ALT 17 07/04/2017 1021   ALKPHOS 86 07/04/2017 1021   BILITOT 0.3 07/04/2017 1021      Component Value Date/Time   TSH 3.880 07/04/2017 1021   TSH 3.190 03/22/2017 1057   TSH 4.000 01/25/2017 1109    ASSESSMENT AND PLAN: Vitamin D deficiency  Class 1 obesity with serious comorbidity and body mass index (BMI) of 30.0 to 30.9 in adult, unspecified obesity type  PLAN:  Vitamin D Deficiency Molly Giles was informed that low vitamin D levels contributes to fatigue and are associated with obesity, breast, and colon cancer. Molly Giles agrees to continue taking prescription Vit D @50 ,000 IU every week #4 and will follow up for routine testing of vitamin D, at least 2-3 times per year. She was informed of the risk of over-replacement of vitamin D and agrees to not increase her dose unless he discusses this with [name] first. Molly Giles agrees to follow up with our clinic in 3 weeks.  We spent > than 50% of the 15 minute visit on the counseling as documented in the note.  Obesity Molly Giles is currently in the action stage of change. As such, her goal is to continue with weight loss efforts She has agreed to change to keep a food journal with 1100-1300 calories and 90 grams of protein daily Molly Giles has been instructed to work up to a goal of 150 minutes of combined cardio  and strengthening exercise per week for weight loss and overall health benefits. We discussed the following Behavioral Modification Strategies today: increasing lean protein intake and keep a strict food journal   Molly Giles has agreed to follow up with our clinic in 3 weeks. She was informed of the importance of frequent follow up visits to maximize her success with intensive lifestyle modifications for her multiple health conditions.  I, Trixie Dredge, am acting as transcriptionist for Lacy Duverney, PA-C  I have reviewed the above documentation for accuracy and completeness, and I agree with the above.  -Lacy Duverney, PA-C  I have reviewed the above note and agree with the plan. -Dennard Nip, MD       Today's visit was # 8 out of 22.  Starting weight: 204 lbs Starting date: 03/22/17  Today's weight : 182 lbs  Today's date: 07/23/2017 Total lbs lost to date: 50 (Patients must lose 7 lbs in the first 6 months to continue with counseling)   ASK: We discussed the diagnosis of obesity with Molly Giles today and Molly Giles agreed to give [name] permission to discuss obesity behavioral modification therapy today.  ASSESS: Molly Giles has the diagnosis of obesity and her BMI today is 28.5 Molly Giles is in the action stage of change   ADVISE: Molly Giles was educated on the multiple health risks of obesity as well as the benefit of weight loss to improve her health. She was advised of the need for long term treatment and the importance of lifestyle modifications.  AGREE: Multiple dietary modification options and treatment options were discussed and  Molly Giles agreed to keep a food journal with 1100-1300 calories and 90 grams of protein daily We discussed the following Behavioral Modification Strategies today: increasing lean protein intake and keep a strict food journal

## 2017-07-26 ENCOUNTER — Ambulatory Visit (INDEPENDENT_AMBULATORY_CARE_PROVIDER_SITE_OTHER): Payer: BLUE CROSS/BLUE SHIELD | Admitting: Physician Assistant

## 2017-07-31 ENCOUNTER — Other Ambulatory Visit (INDEPENDENT_AMBULATORY_CARE_PROVIDER_SITE_OTHER): Payer: Self-pay | Admitting: Physician Assistant

## 2017-07-31 DIAGNOSIS — E559 Vitamin D deficiency, unspecified: Secondary | ICD-10-CM

## 2017-08-08 ENCOUNTER — Other Ambulatory Visit (HOSPITAL_COMMUNITY): Payer: Self-pay | Admitting: Orthopedic Surgery

## 2017-08-08 DIAGNOSIS — Z96642 Presence of left artificial hip joint: Secondary | ICD-10-CM

## 2017-08-09 ENCOUNTER — Telehealth: Payer: Self-pay | Admitting: Family Medicine

## 2017-08-09 ENCOUNTER — Ambulatory Visit (INDEPENDENT_AMBULATORY_CARE_PROVIDER_SITE_OTHER): Payer: BLUE CROSS/BLUE SHIELD | Admitting: Physician Assistant

## 2017-08-09 VITALS — BP 108/75 | HR 84 | Temp 98.3°F | Ht 67.0 in | Wt 181.0 lb

## 2017-08-09 DIAGNOSIS — J069 Acute upper respiratory infection, unspecified: Secondary | ICD-10-CM | POA: Diagnosis not present

## 2017-08-09 DIAGNOSIS — Z6830 Body mass index (BMI) 30.0-30.9, adult: Secondary | ICD-10-CM

## 2017-08-09 DIAGNOSIS — E669 Obesity, unspecified: Secondary | ICD-10-CM | POA: Diagnosis not present

## 2017-08-09 DIAGNOSIS — E559 Vitamin D deficiency, unspecified: Secondary | ICD-10-CM

## 2017-08-09 MED ORDER — VITAMIN D (ERGOCALCIFEROL) 1.25 MG (50000 UNIT) PO CAPS: 50000.0000 [IU] | ORAL_CAPSULE | ORAL | 0 refills | Status: DC

## 2017-08-09 NOTE — Telephone Encounter (Signed)
Will forward to MD to advise but patient may have to come in for an appointment.  Jazmin Hartsell,CMA

## 2017-08-09 NOTE — Telephone Encounter (Signed)
Pt has a sore throat for since Friday.  It is painfull to swallow. She has a wet cough. She would like an antibotic called in to [facility] and [facility].

## 2017-08-09 NOTE — Progress Notes (Signed)
Office: [phone]  /  Fax: [phone]   HPI:   Chief Complaint: OBESITY Molly Giles is here to discuss her progress with her obesity treatment plan. She is on the keep a food journal with 1100-1300 calories and 90 grams of protein daily and is following her eating plan approximately 85 % of the time. She states she is walking and doing yoga for 20 minutes 1-2 times per week. Molly Giles has been sick with an upper respiratory infection. She is mindful of her eating and would like more meal planning ideas.  Her weight is 181 lb (82.1 kg) today and has had a weight loss of 1 pound over a period of 2 to 3 weeks since her last visit. She has lost 23 lbs since starting treatment with [name].  Vitamin D deficiency Molly Giles has a diagnosis of vitamin D deficiency. She is currently taking prescription Vit D and denies nausea, vomiting or muscle weakness.  Upper Respiratory Infection Molly Giles complains of cough, runny nose, sneezing for the past few days, then developed mild headache and some facial pressure yesterday. She denies any fever, chills, body ache, sore throat, dyspnea, or chest pain. Has been using Dayquil/Nyquil. ALLERGIES: Allergies  Allergen Reactions  . Sulfa Antibiotics Itching  . Codeine Nausea Only    "head nausea"   . Lyrica [Pregabalin] Other (See Comments)    Dizziness, nausea  . Betadine [Povidone Iodine] Rash    burning  . Hibiclens [Chlorhexidine Gluconate] Other (See Comments)    Burning  . Latex Itching and Rash    burning  . Wellbutrin [Bupropion] Anxiety    MEDICATIONS: Current Outpatient Medications on File Prior to Visit  Medication Sig Dispense Refill  . citalopram (CELEXA) 20 MG tablet Take 1 tablet (20 mg total) by mouth daily. 90 tablet 1  . diclofenac sodium (VOLTAREN) 1 % GEL Apply 3 gm to 3 large joints up to 3 times a day.Dispense 3 tubes with 3 refills. 3 Tube 2  . eletriptan (RELPAX) 40 MG tablet Take 1 tablet (40 mg total) by mouth as needed for migraine. may  repeat in 2 hours if necessary 10 tablet 0  . estrogen, conjugated,-medroxyprogesterone (PREMPRO) 0.3-1.5 MG tablet Take 1 tablet by mouth at bedtime. 30 tablet 3  . ibuprofen (ADVIL,MOTRIN) 200 MG tablet Take 800 mg by mouth.     . levothyroxine (SYNTHROID, LEVOTHROID) 125 MCG tablet TAKE 1/2 TABLET BY MOUTH DAILY 90 tablet 1  . loratadine (CLARITIN) 10 MG tablet Take 10 mg by mouth daily.    Marland Kitchen nystatin-triamcinolone (MYCOLOG II) cream Apply 1 application topically 2 (two) times daily.     . prednisoLONE acetate (PRED FORTE) 1 % ophthalmic suspension Place 1 drop into both eyes daily.    . Probiotic Product ([place]) Take by mouth.    . rosuvastatin (CRESTOR) 10 MG tablet Take 1 tablet (10 mg total) by mouth daily. 90 tablet 3  . traMADol (ULTRAM) 50 MG tablet Take 2 tablets (100 mg total) by mouth 2 (two) times daily. 120 tablet 0  . zolpidem (AMBIEN) 10 MG tablet Take 1 tablet (10 mg total) by mouth at bedtime as needed. 90 tablet 0   No current facility-administered medications on file prior to visit.     PAST MEDICAL HISTORY: Past Medical History:  Diagnosis Date  . Allergic rhinoconjunctivitis 07/02/2015  . Arthritis   . Benign positional vertigo 04/2015   Responded well to [facility]  . Bruxism (teeth grinding)   . Chronic migraine  02/25/2016   Per Dr Jaynee Eagles review in notes from [facility] from September 2015. Showed total headache days last month 18. Severe headache days 7 days. Moderate headache days 5 days. Mild headache days last month sick days. Days without headache last month 10 days. Symptoms associated with photophobia, phonophobia, osmophobia, neck pain, dizziness, jaw pain, nasal congestion, vision disturbances, tingling and numbness, weakness and worsening with activity. Each headache attack last 3 hours depending on treatment in severity. Left side, the right side, easier side, the frontal area in the back of the head. Characterized  as throbbing, pressure, tightness, squeezing, stabbing and burning   . Chronic migraine w/o aura w/o status migrainosus, not intractable 07/14/2015  . DDD (degenerative disc disease), lumbar   . Degenerative cervical disc   . Depression   . Dysrhythmia    seen by dr Wynonia Lawman- not a problem since she has been on Bystolic  . Essential hypertension 05/15/2013  . Family history of adverse reaction to anesthesia    Brother- N/V  . Family history of premature CAD 05/15/2013  . Fibromyalgia   . GERD (gastroesophageal reflux disease) 12/16/2014  . H/O seasonal allergies   . Hearing loss of both ears 07/27/2015   mild to borderline moderate low frequency hearing loss improving to within normal limits bilaterally on audiology testing at [facility] in November 2016.    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 History of irritable bowel syndrome 02/15/2016  . History of migraine headaches 05/15/2013   Per Dr Jaynee Eagles review in notes from [facility] from September 2015. Showed total headache days last month 18. Severe headache days 7 days. Moderate headache days 5 days. Mild headache days last month sick days. Days without headache last month 10 days. Symptoms associated with photophobia, phonophobia, osmophobia, neck pain, dizziness, jaw pain, nasal congestion, vision disturbances, tingling and numbness, weakness and worsening with activity. Each headache attack last 3 hours depending on treatment in severity. Left side, the right side, easier side, the frontal area in the back of the head. Characterized as throbbing, pressure, tightness, squeezing, stabbing and burning    . Hx of bad fall 02/2015   Severe Facial/head trauma without fracture  . Hyperlipidemia 1998  . Hypertension 2004  . Hypothyroidism   . Impairment of balance 02/2015   Consequent of postconcussive syndrome  . Interstitial cystitis   . Meniere's disease of right  ear 12/03/2015  . Migraine 1962   Chronic Migraines  . Mood disorder ([place])   . Musculoskeletal neck pain 07/14/2015  . Normal coronary arteries 05/14/2014  . Osteoarthritis of left hip 11/01/2015   MRI order by Dr Alvan Dame (ortho) 10/2015 showed significant arthritis of left hip joint with cystic changes in femoral head c/w osteoarthritis  . 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 Overweight   . Pain in joint of left shoulder 11/09/2016  . Palpitations 05/15/2013   [facility] manages  . Periodic limb movement sleep disorder 03/28/2017  . Perirectal cyst 05/07/2016  . PONV (postoperative nausea and vomiting)   . Post concussion syndrome 06/06/2015  . Post concussive syndrome 07/14/2015   Ms Wahlberg's post-concussive syndrome manifesting in vertigo and headache, mood changes, poor balance, dizziness, and decreased concentration per Dr Jaynee Eagles at [facility].   Marland Kitchen Posterior vitreous detachment of right eye 2014  . Shortness of breath dyspnea    with exertion  . Thyroid nodule 08/11/2009  Findings: The thyroid gland is within normal limits in size.  The gland is diffusely inhomogeneous. A small solid nodule is noted in the lower pole  medially on the right of 7 x 6 x 8 mm. A small solid nodule is noted inferiorly on the left of 3 x 3 x 4 mm.  IMPRESSION:  The thyroid gland is within normal limits in size with only small solid nodules present, the largest of only 8 mm in diameter on the right.    . Vasomotor symptoms due to menopause 04/19/2017    PAST SURGICAL HISTORY: Past Surgical History:  Procedure Laterality Date  . Bladder dilitation     x 3  . BREAST BIOPSY  2011   Benign histology  . CARDIOVASCULAR STRESS TEST  2000   Unremarkable per pt report  . CARPOMETACARPAL JOINT ARTHROTOMY Right 2011  . COLONOSCOPY    . COLONOSCOPY WITH PROPOFOL N/A 04/21/2015   Procedure: COLONOSCOPY WITH PROPOFOL;  Surgeon: Carol Ada, MD;  Location: [place] [facility];  Service: Endoscopy;  Laterality: N/A;  . EPIDURAL BLOCK INJECTION Left 04/12/2016   Left Medial Nerve Block and Left L5 ramus block, Dr Suella Broad   . EPIDURAL BLOCK INJECTION  03/21/2016   Left L3-4 medial branch block and Left L5 & dorsal ramus block   . EPIDURAL BLOCK INJECTION N/A 10/25/2016   Suella Broad, MD. Lumbar medial branch block  . EPIDURAL BLOCK INJECTION N/A 02/09/2017   Suella Broad, MD.  Bilateral L3/4 medial branch block, bilateral L5 dorsal ramus block  . EPIDURAL BLOCK INJECTION N/A 07/04/2017   Suella Broad, MD  . FECAL TRANSPLANT  04/21/2015   Procedure: FECAL TRANSPLANT;  Surgeon: Carol Ada, MD;  Location: [facility];  Service: Endoscopy;;  . HIP ARTHROPLASTY    . INJECTION HIP INTRA ARTICULAR Left 11/2015   for OA by Dr Suella Broad  . OTHER SURGICAL HISTORY Left 2016   Left L3/L4 medial nerve block and Left L5 Dorsal Ramus block Dr Mickel Duhamel  . TOTAL HIP ARTHROPLASTY Left 07/25/2016   Procedure: LEFT TOTAL HIP ARTHROPLASTY ANTERIOR APPROACH;  Surgeon: Paralee Cancel, MD;  Location: [facility];  Service: Orthopedics;  Laterality: Left;    SOCIAL HISTORY: Social History   Tobacco Use  . Smoking status: Passive Smoke Exposure - Never Smoker  . Smokeless tobacco: Never Used  . Tobacco comment: As a child  Substance Use Topics  . Alcohol use: No    Alcohol/week: 0.0 oz    Comment: No use in 30 years.  . Drug use: No    FAMILY HISTORY: Family History  Problem Relation Age of Onset  . Alzheimer's disease Mother   . Hyperlipidemia Mother   . Hypertension Mother   . Osteoporosis Mother   . Parkinson's disease Mother   . Migraines Sister   . Allergies Sister   . Hypertension Sister   . Hyperlipidemia Brother   . Cardiomyopathy Brother   . Diabetes type II Brother   . Kidney disease Brother   . Asthma Brother   . Heart disease Brother   . Hypertension Brother   . Hyperlipidemia Brother   . Kidney disease Brother   . Heart disease  Father   . Hyperlipidemia Father   . Hypertension Father   . Aortic aneurysm Father   . Early death Father 29  . Diabetes type II Unknown   . Breast cancer Maternal Aunt     ROS: Review of Systems  Constitutional: Positive for weight loss. Negative  for chills and fever.  HENT: Positive for sinus pain. Negative for sore throat.        Runny nose Sneezing   Respiratory: Positive for cough. Negative for shortness of breath.   Cardiovascular: Negative for chest pain.  Gastrointestinal: Negative for nausea and vomiting.  Musculoskeletal: Negative for myalgias.       Negative muscle weakness Negative body aches  Neurological: Positive for headaches.    PHYSICAL EXAM: Blood pressure 108/75, pulse 84, temperature 98.3 F (36.8 C), temperature source Oral, height 5\' 7"  (1.702 m), weight 181 lb (82.1 kg), SpO2 96 %. Body mass index is 28.35 kg/m. Physical Exam  Constitutional: She is oriented to person, place, and time. She appears well-developed and well-nourished.  Cardiovascular: Normal rate.  Pulmonary/Chest: Effort normal.  Musculoskeletal: Normal range of motion.  Neurological: She is oriented to person, place, and time.  Skin: Skin is warm and dry.  Psychiatric: She has a normal mood and affect. Her behavior is normal.  Vitals reviewed.   RECENT LABS AND TESTS: BMET    Component Value Date/Time   NA 142 07/04/2017 1021   K 4.3 07/04/2017 1021   CL 107 (H) 07/04/2017 1021   CO2 25 07/04/2017 1021   GLUCOSE 83 07/04/2017 1021   GLUCOSE 132 (H) 07/26/2016 0419   BUN 13 07/04/2017 1021   CREATININE 0.75 07/04/2017 1021   CREATININE 0.74 02/24/2016 1158   CALCIUM 9.2 07/04/2017 1021   GFRNONAA 86 07/04/2017 1021   GFRNONAA 88 02/24/2016 1158   GFRAA 99 07/04/2017 1021   GFRAA >89 02/24/2016 1158   Lab Results  Component Value Date   HGBA1C 5.2 07/04/2017   HGBA1C 5.3 03/22/2017   Lab Results  Component Value Date   INSULIN 11.8 07/04/2017   INSULIN 10.5  03/22/2017   CBC    Component Value Date/Time   WBC 6.6 03/22/2017 1057   WBC 15.6 (H) 07/26/2016 0419   RBC 4.40 03/22/2017 1057   RBC 3.73 (L) 07/26/2016 0419   HGB 12.9 03/22/2017 1057   HCT 39.6 03/22/2017 1057   PLT 236 07/26/2016 0419   MCV 90 03/22/2017 1057   MCH 29.3 03/22/2017 1057   MCH 29.2 07/26/2016 0419   MCHC 32.6 03/22/2017 1057   MCHC 32.4 07/26/2016 0419   RDW 14.3 03/22/2017 1057   LYMPHSABS 2.1 03/22/2017 1057   EOSABS 0.1 03/22/2017 1057   BASOSABS 0.0 03/22/2017 1057   Iron/TIBC/Ferritin/ %Sat No results found for: IRON, TIBC, FERRITIN, IRONPCTSAT Lipid Panel     Component Value Date/Time   CHOL 150 07/04/2017 1021   TRIG 119 07/04/2017 1021   HDL 48 07/04/2017 1021   CHOLHDL 3.9 12/14/2014 1036   VLDL 26 12/14/2014 1036   LDLCALC 78 07/04/2017 1021   Hepatic Function Panel     Component Value Date/Time   PROT 6.4 07/04/2017 1021   ALBUMIN 4.3 07/04/2017 1021   AST 16 07/04/2017 1021   ALT 17 07/04/2017 1021   ALKPHOS 86 07/04/2017 1021   BILITOT 0.3 07/04/2017 1021      Component Value Date/Time   TSH 3.880 07/04/2017 1021   TSH 3.190 03/22/2017 1057   TSH 4.000 01/25/2017 1109    ASSESSMENT AND PLAN: Vitamin D deficiency - Plan: Vitamin D, Ergocalciferol, (DRISDOL) 50000 units CAPS capsule  Upper respiratory tract infection, unspecified type  Class 1 obesity with serious comorbidity and body mass index (BMI) of 30.0 to 30.9 in adult, unspecified obesity type - beginning BMI >30  PLAN:  Vitamin D Deficiency Molly Giles was informed that low vitamin D levels contributes to fatigue and are associated with obesity, breast, and colon cancer. Molly Giles agrees to continue taking prescription Vit D @50 ,000 IU every week #4 and we will refill for 1 month. She will follow up for routine testing of vitamin D, at least 2-3 times per year. She was informed of the risk of over-replacement of vitamin D and agrees to not increase her dose unless he  discusses this with [name] first. Molly Giles agrees to follow up with our clinic in 3 weeks.  Upper Respiratory Infection Molly Giles is advised that her symptoms can indicate a sinus infection and a more throough exam is warranted (Due to lack of medical equipment, unable to perform exam during this visit). She is advised to see her PCP for further evaluation. She is advised to take OTC Delsym for cough and Mucinex sinus for her congestion. Molly Giles agrees to the plan. Molly Giles agrees to follow up with our clinic in 3 weeks.  Obesity  Molly Giles is currently in the action stage of change. As such, her goal is to continue with weight loss efforts She has agreed to keep a food journal with 1100-1300 calories and 90 grams of protein daily Molly Giles has been instructed to work up to a goal of 150 minutes of combined cardio and strengthening exercise per week for weight loss and overall health benefits. We discussed the following Behavioral Modification Strategies today: increasing lean protein intake and work on meal planning and easy cooking plans   Molly Giles has agreed to follow up with our clinic in 3 weeks. She was informed of the importance of frequent follow up visits to maximize her success with intensive lifestyle modifications for her multiple health conditions.  I, Molly Giles, am acting as transcriptionist for Molly Duverney, PA-C  I have reviewed the above documentation for accuracy and completeness, and I agree with the above. -Molly Duverney, PA-C  I have reviewed the above note and agree with the plan. -Molly Nip, MD     Today's visit was # 9 out of 22.  Starting weight: 204 lbs Starting date: 03/22/17 Today's weight : 181 lbs  Today's date: 08/09/2017 Total lbs lost to date: 23 (Patients must lose 7 lbs in the first 6 months to continue with counseling)   ASK: We discussed the diagnosis of obesity with Molly Giles today and Molly Giles agreed to give [name] permission to discuss obesity behavioral modification  therapy today.  ASSESS: Molly Giles has the diagnosis of obesity and her BMI today is 28.34 Molly Giles is in the action stage of change   ADVISE: Molly Giles was educated on the multiple health risks of obesity as well as the benefit of weight loss to improve her health. She was advised of the need for long term treatment and the importance of lifestyle modifications.  AGREE: Multiple dietary modification options and treatment options were discussed and  Molly Giles agreed to keep a food journal with 1100-1300 calories and 90 grams of protein daily We discussed the following Behavioral Modification Strategies today: increasing lean protein intake and work on meal planning and easy cooking plans

## 2017-08-10 ENCOUNTER — Ambulatory Visit: Payer: BLUE CROSS/BLUE SHIELD

## 2017-08-10 ENCOUNTER — Telehealth: Payer: Self-pay | Admitting: Family Medicine

## 2017-08-10 DIAGNOSIS — R69 Illness, unspecified: Principal | ICD-10-CM

## 2017-08-10 DIAGNOSIS — J111 Influenza due to unidentified influenza virus with other respiratory manifestations: Secondary | ICD-10-CM

## 2017-08-10 MED ORDER — BENZONATATE 200 MG PO CAPS: 200.0000 mg | ORAL_CAPSULE | Freq: Three times a day (TID) | ORAL | 0 refills | Status: DC | PRN

## 2017-08-10 NOTE — Telephone Encounter (Signed)
Patient needs to come in to be evaluated.   She experienced C diff colitis when she received antibiotics in the past.    We would want to be sure that she has a diagnosis that make antibiotics absolutely necessary before prescribing them to her.

## 2017-08-10 NOTE — Telephone Encounter (Signed)
Patient informed and will come in after lunch to be worked up as an Brewing technologist patient. Baby Gieger,CMA

## 2017-08-10 NOTE — Telephone Encounter (Signed)
I spoke with Mrs Slager.  She is experiencing headache, facial pressure, nasal congestion, postnasal drip, sore throat and cough with purulant sputum.  Malaise.  No fever.  Exposed to grandchild with cold just before onset.  Duration about one week. 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   A/ Influenza-like illness, greater than 72 hours of symptoms P/ Rx Tessalon Pearls     Rx Afrin spray for 4 days max     Get evaluation if worsens or fails to improve by  11/4.

## 2017-08-13 ENCOUNTER — Ambulatory Visit (INDEPENDENT_AMBULATORY_CARE_PROVIDER_SITE_OTHER): Payer: BLUE CROSS/BLUE SHIELD | Admitting: Physician Assistant

## 2017-08-14 ENCOUNTER — Encounter: Payer: Self-pay | Admitting: Internal Medicine

## 2017-08-14 ENCOUNTER — Ambulatory Visit: Payer: BLUE CROSS/BLUE SHIELD

## 2017-08-14 ENCOUNTER — Other Ambulatory Visit: Payer: Self-pay

## 2017-08-14 ENCOUNTER — Ambulatory Visit: Payer: BLUE CROSS/BLUE SHIELD | Admitting: Internal Medicine

## 2017-08-14 ENCOUNTER — Ambulatory Visit: Payer: BLUE CROSS/BLUE SHIELD | Admitting: Neurology

## 2017-08-14 DIAGNOSIS — J111 Influenza due to unidentified influenza virus with other respiratory manifestations: Secondary | ICD-10-CM

## 2017-08-14 DIAGNOSIS — R69 Illness, unspecified: Secondary | ICD-10-CM

## 2017-08-14 DIAGNOSIS — J069 Acute upper respiratory infection, unspecified: Secondary | ICD-10-CM

## 2017-08-14 MED ORDER — BENZONATATE 200 MG PO CAPS: 200.0000 mg | ORAL_CAPSULE | Freq: Three times a day (TID) | ORAL | 0 refills | Status: DC | PRN

## 2017-08-14 NOTE — Assessment & Plan Note (Signed)
More consistent with viral etiology than bacterial. Patient well-appearing, afebrile, lungs CTAB, no significant nasal drainage or facial tenderness, no signs of otitis. As such, do not feel that antibiotics are indicated, especially as patient has history of severe C.diff requiring fecal transplant. Patient displeased by this, as she wants to feel better for her trip. Expressed sympathy that patient is not feeling well when she is supposed to be on vacation, but explained my rationale for not prescribing antibiotics at this time. Refilled Tessalon perles for patient and encouraged her to continue Mucinex/Delsym. Also encouraged her to try Afrin again if congestion was particularly bothersome.

## 2017-08-14 NOTE — Patient Instructions (Addendum)
It was nice meeting you today Ms. Melecio!  Please continue to take one Tessalon perle up to every 8 hours as needed for your cough. You can also have a teaspoonful of honey, either alone or mixed into a warm beverage, 3-4 times a day to help with cough. Sleeping propped up on a couple of pillows can help you cough less at night and get more sleep.   For nasal congestion, you can continue to use the Afrin nasal spray, however do not use for more than three days. You can continue to take Mucinex or Delsym as well.   If you have any questions or concerns, please feel free to call the clinic.   Be well,  Dr. Avon Gully

## 2017-08-14 NOTE — Progress Notes (Signed)
   Subjective:   Patient: Molly Giles       Birthdate: [birth year]       MRN: [id number]      HPI  BRIDNEY GUADARRAMA is a 61 y.o. female presenting for same day appt for cold symptom.   Cough, nasal congestion Patient spoke with her PCP Dr. Wendy Poet on 11/30 for this issue. Dr. McDiarmid felt that patient had an influenza-like illness given her reported symptoms, and prescribed Tessalon perles and Afrin nasal spray. He instructed patient to return to care if no improvement by today.  Today, patient is requesting antibiotics, specifically a Z pack. She was supposed to leave today for a trip to [place] but rescheduled her plane ticket to tomorrow because she was not feeling well. She does report improvement in her cough with Tessalon perles. Says she used Afrin once in one nostril and it burned so she did not use it any more. Has also been taking Mucinex, Delsym, and generic severe cough/cold medicine which she says is somewhat effective. Is still experiencing some nasal congestion, as well as cough productive of light green sputum and ear pressure. Denies sore throat. Denies chest pain when coughing. Denies fevers, decreased appetite. Some difficulty sleeping due to cough.   Smoking status reviewed. Patient is never smoker.   Review of Systems See HPI.     Objective:  Physical Exam  Constitutional: She is oriented to person, place, and time and well-developed, well-nourished, and in no distress.  HENT:  Head: Normocephalic and atraumatic.  Right Ear: External ear normal.  Left Ear: External ear normal.  Nose: Nose normal.  Mouth/Throat: Oropharynx is clear and moist. No oropharyngeal exudate.  Eyes: Conjunctivae and EOM are normal. Pupils are equal, round, and reactive to light. Right eye exhibits no discharge. Left eye exhibits no discharge.  Neck: Normal range of motion. Neck supple.  Cardiovascular: Normal rate, regular rhythm and normal heart sounds.  No murmur heard. Pulmonary/Chest:  Effort normal and breath sounds normal. No respiratory distress. She has no wheezes.  Lymphadenopathy:    She has no cervical adenopathy.  Neurological: She is alert and oriented to person, place, and time.  Skin: Skin is warm and dry.  Psychiatric: Affect and judgment normal.      Assessment & Plan:  URI (upper respiratory infection) More consistent with viral etiology than bacterial. Patient well-appearing, afebrile, lungs CTAB, no significant nasal drainage or facial tenderness, no signs of otitis. As such, do not feel that antibiotics are indicated, especially as patient has history of severe C.diff requiring fecal transplant. Patient displeased by this, as she wants to feel better for her trip. Expressed sympathy that patient is not feeling well when she is supposed to be on vacation, but explained my rationale for not prescribing antibiotics at this time. Refilled Tessalon perles for patient and encouraged her to continue Mucinex/Delsym. Also encouraged her to try Afrin again if congestion was particularly bothersome.   Precepted with Dr. Alease Frame.   Adin Hector, MD, MPH PGY-3 [facility] Pager [phone]

## 2017-08-16 ENCOUNTER — Other Ambulatory Visit: Payer: Self-pay | Admitting: *Deleted

## 2017-08-16 DIAGNOSIS — M797 Fibromyalgia: Secondary | ICD-10-CM

## 2017-08-16 MED ORDER — ZOLPIDEM TARTRATE 10 MG PO TABS: 10.0000 mg | ORAL_TABLET | Freq: Every evening | ORAL | 0 refills | Status: DC | PRN

## 2017-08-16 NOTE — Telephone Encounter (Signed)
Refill Request received via fax  Last visit: 06/26/17 Next Visit: 12/27/17  Okay to refill Ambien?

## 2017-08-16 NOTE — Telephone Encounter (Signed)
ok 

## 2017-08-20 ENCOUNTER — Encounter (HOSPITAL_COMMUNITY): Payer: BLUE CROSS/BLUE SHIELD

## 2017-08-29 ENCOUNTER — Encounter (INDEPENDENT_AMBULATORY_CARE_PROVIDER_SITE_OTHER): Payer: Self-pay

## 2017-08-29 ENCOUNTER — Ambulatory Visit (INDEPENDENT_AMBULATORY_CARE_PROVIDER_SITE_OTHER): Payer: BLUE CROSS/BLUE SHIELD | Admitting: Physician Assistant

## 2017-08-29 ENCOUNTER — Encounter (HOSPITAL_COMMUNITY)
Admission: RE | Admit: 2017-08-29 | Discharge: 2017-08-29 | Disposition: A | Discharge status: Home / Self Care | Payer: BLUE CROSS/BLUE SHIELD | Source: Ambulatory Visit | Attending: Orthopedic Surgery | Admitting: Orthopedic Surgery

## 2017-08-29 DIAGNOSIS — Z96642 Presence of left artificial hip joint: Secondary | ICD-10-CM | POA: Insufficient documentation

## 2017-08-29 MED ORDER — TECHNETIUM TC 99M MEDRONATE IV KIT
25.0000 | PACK | Freq: Once | INTRAVENOUS | Status: AC | PRN
  Administered 2017-08-29: 25 via INTRAVENOUS

## 2017-08-30 ENCOUNTER — Ambulatory Visit (INDEPENDENT_AMBULATORY_CARE_PROVIDER_SITE_OTHER): Payer: BLUE CROSS/BLUE SHIELD | Admitting: Family Medicine

## 2017-08-30 VITALS — BP 107/70 | HR 79 | Temp 98.1°F | Ht 67.0 in | Wt 180.0 lb

## 2017-08-30 DIAGNOSIS — Z6830 Body mass index (BMI) 30.0-30.9, adult: Secondary | ICD-10-CM

## 2017-08-30 DIAGNOSIS — E559 Vitamin D deficiency, unspecified: Secondary | ICD-10-CM

## 2017-08-30 DIAGNOSIS — E669 Obesity, unspecified: Secondary | ICD-10-CM

## 2017-08-30 MED ORDER — VITAMIN D (ERGOCALCIFEROL) 1.25 MG (50000 UNIT) PO CAPS: 50000.0000 [IU] | ORAL_CAPSULE | ORAL | 0 refills | Status: DC

## 2017-08-30 NOTE — Progress Notes (Signed)
Office: [phone]  /  Fax: [phone]   HPI:   Chief Complaint: OBESITY Molly Giles is here to discuss her progress with her obesity treatment plan. She is on the keep a food journal with 1100 to 1300 calories and 90 grams of protein daily and is following her eating plan approximately 75 % of the time. She states she is exercising 0 minutes 0 times per week. Molly Giles continues to do well with weight loss, but is worried she is starting to be complacent, especially since she has lost and gained previously.  Her weight is 180 lb (81.6 kg) today and has had a weight loss of 1 pound over a period of 3 weeks since her last visit. She has lost 24 lbs since starting treatment with [name].  Vitamin D deficiency Molly Giles has a diagnosis of vitamin D deficiency. She is stable on vit D and is almost at goal. Molly Giles denies nausea, vomiting or muscle weakness.   ALLERGIES: Allergies  Allergen Reactions  . Sulfa Antibiotics Itching  . Codeine Nausea Only    "head nausea"   . Lyrica [Pregabalin] Other (See Comments)    Dizziness, nausea  . Betadine [Povidone Iodine] Rash    burning  . Hibiclens [Chlorhexidine Gluconate] Other (See Comments)    Burning  . Latex Itching and Rash    burning  . Wellbutrin [Bupropion] Anxiety    MEDICATIONS: Current Outpatient Medications on File Prior to Visit  Medication Sig Dispense Refill  . benzonatate (TESSALON) 200 MG capsule Take 1 capsule (200 mg total) by mouth 3 (three) times daily as needed for cough. 30 capsule 0  . citalopram (CELEXA) 20 MG tablet Take 1 tablet (20 mg total) by mouth daily. 90 tablet 1  . diclofenac sodium (VOLTAREN) 1 % GEL Apply 3 gm to 3 large joints up to 3 times a day.Dispense 3 tubes with 3 refills. 3 Tube 2  . eletriptan (RELPAX) 40 MG tablet Take 1 tablet (40 mg total) by mouth as needed for migraine. may repeat in 2 hours if necessary 10 tablet 0  . estrogen, conjugated,-medroxyprogesterone (PREMPRO) 0.3-1.5 MG tablet Take 1 tablet  by mouth at bedtime. 30 tablet 3  . ibuprofen (ADVIL,MOTRIN) 200 MG tablet Take 800 mg by mouth.     . levothyroxine (SYNTHROID, LEVOTHROID) 125 MCG tablet TAKE 1/2 TABLET BY MOUTH DAILY 90 tablet 1  . loratadine (CLARITIN) 10 MG tablet Take 10 mg by mouth daily.    Marland Kitchen nystatin-triamcinolone (MYCOLOG II) cream Apply 1 application topically 2 (two) times daily.     . prednisoLONE acetate (PRED FORTE) 1 % ophthalmic suspension Place 1 drop into both eyes daily.    . Probiotic Product ([place]) Take by mouth.    . rosuvastatin (CRESTOR) 10 MG tablet Take 1 tablet (10 mg total) by mouth daily. 90 tablet 3  . traMADol (ULTRAM) 50 MG tablet Take 2 tablets (100 mg total) by mouth 2 (two) times daily. 120 tablet 0  . Vitamin D, Ergocalciferol, (DRISDOL) 50000 units CAPS capsule Take 1 capsule (50,000 Units total) by mouth every 7 (seven) days. 4 capsule 0  . zolpidem (AMBIEN) 10 MG tablet Take 1 tablet (10 mg total) by mouth at bedtime as needed. 90 tablet 0   No current facility-administered medications on file prior to visit.     PAST MEDICAL HISTORY: Past Medical History:  Diagnosis Date  . Allergic rhinoconjunctivitis 07/02/2015  . Arthritis   . Benign positional vertigo 04/2015   Responded  well to [facility]  . Bruxism (teeth grinding)   . Chronic migraine 02/25/2016   Per Dr Jaynee Eagles review in notes from [facility] from September 2015. Showed total headache days last month 18. Severe headache days 7 days. Moderate headache days 5 days. Mild headache days last month sick days. Days without headache last month 10 days. Symptoms associated with photophobia, phonophobia, osmophobia, neck pain, dizziness, jaw pain, nasal congestion, vision disturbances, tingling and numbness, weakness and worsening with activity. Each headache attack last 3 hours depending on treatment in severity. Left side, the right side, easier side, the frontal area in the back of the head.  Characterized as throbbing, pressure, tightness, squeezing, stabbing and burning   . Chronic migraine w/o aura w/o status migrainosus, not intractable 07/14/2015  . DDD (degenerative disc disease), lumbar   . Degenerative cervical disc   . Depression   . Dysrhythmia    seen by dr Wynonia Lawman- not a problem since she has been on Bystolic  . Essential hypertension 05/15/2013  . Family history of adverse reaction to anesthesia    Brother- N/V  . Family history of premature CAD 05/15/2013  . Fibromyalgia   . GERD (gastroesophageal reflux disease) 12/16/2014  . H/O seasonal allergies   . Hearing loss of both ears 07/27/2015   mild to borderline moderate low frequency hearing loss improving to within normal limits bilaterally on audiology testing at [facility] in November 2016.    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 History of irritable bowel syndrome 02/15/2016  . History of migraine headaches 05/15/2013   Per Dr Jaynee Eagles review in notes from [facility] from September 2015. Showed total headache days last month 18. Severe headache days 7 days. Moderate headache days 5 days. Mild headache days last month sick days. Days without headache last month 10 days. Symptoms associated with photophobia, phonophobia, osmophobia, neck pain, dizziness, jaw pain, nasal congestion, vision disturbances, tingling and numbness, weakness and worsening with activity. Each headache attack last 3 hours depending on treatment in severity. Left side, the right side, easier side, the frontal area in the back of the head. Characterized as throbbing, pressure, tightness, squeezing, stabbing and burning    . Hx of bad fall 02/2015   Severe Facial/head trauma without fracture  . Hyperlipidemia 1998  . Hypertension 2004  . Hypothyroidism   . Impairment of balance 02/2015   Consequent of postconcussive syndrome  . Interstitial cystitis   . Meniere's  disease of right ear 12/03/2015  . Migraine 1962   Chronic Migraines  . Mood disorder ([place])   . Musculoskeletal neck pain 07/14/2015  . Normal coronary arteries 05/14/2014  . Osteoarthritis of left hip 11/01/2015   MRI order by Dr Alvan Dame (ortho) 10/2015 showed significant arthritis of left hip joint with cystic changes in femoral head c/w osteoarthritis  . 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 Overweight   . Pain in joint of left shoulder 11/09/2016  . Palpitations 05/15/2013   [facility] manages  . Periodic limb movement sleep disorder 03/28/2017  . Perirectal cyst 05/07/2016  . PONV (postoperative nausea and vomiting)   . Post concussion syndrome 06/06/2015  . Post concussive syndrome 07/14/2015   Ms Gangwer's post-concussive syndrome manifesting in vertigo and headache, mood changes, poor balance, dizziness, and decreased concentration per Dr Jaynee Eagles at [facility].   Marland Kitchen Posterior vitreous detachment of right eye 2014  .  Shortness of breath dyspnea    with exertion  . Thyroid nodule 08/11/2009   Findings: The thyroid gland is within normal limits in size.  The gland is diffusely inhomogeneous. A small solid nodule is noted in the lower pole  medially on the right of 7 x 6 x 8 mm. A small solid nodule is noted inferiorly on the left of 3 x 3 x 4 mm.  IMPRESSION:  The thyroid gland is within normal limits in size with only small solid nodules present, the largest of only 8 mm in diameter on the right.    . Vasomotor symptoms due to menopause 04/19/2017    PAST SURGICAL HISTORY: Past Surgical History:  Procedure Laterality Date  . Bladder dilitation     x 3  . BREAST BIOPSY  2011   Benign histology  . CARDIOVASCULAR STRESS TEST  2000   Unremarkable per pt report  . CARPOMETACARPAL JOINT ARTHROTOMY Right 2011  . COLONOSCOPY    . COLONOSCOPY WITH PROPOFOL N/A 04/21/2015   Procedure: COLONOSCOPY WITH PROPOFOL;  Surgeon: Carol Ada, MD;   Location: [place];  Service: Endoscopy;  Laterality: N/A;  . EPIDURAL BLOCK INJECTION Left 04/12/2016   Left Medial Nerve Block and Left L5 ramus block, Dr Suella Broad   . EPIDURAL BLOCK INJECTION  03/21/2016   Left L3-4 medial branch block and Left L5 & dorsal ramus block   . EPIDURAL BLOCK INJECTION N/A 10/25/2016   Suella Broad, MD. Lumbar medial branch block  . EPIDURAL BLOCK INJECTION N/A 02/09/2017   Suella Broad, MD.  Bilateral L3/4 medial branch block, bilateral L5 dorsal ramus block  . EPIDURAL BLOCK INJECTION N/A 07/04/2017   Suella Broad, MD  . FECAL TRANSPLANT  04/21/2015   Procedure: FECAL TRANSPLANT;  Surgeon: Carol Ada, MD;  Location: [place];  Service: Endoscopy;;  . HIP ARTHROPLASTY    . INJECTION HIP INTRA ARTICULAR Left 11/2015   for OA by Dr Suella Broad  . OTHER SURGICAL HISTORY Left 2016   Left L3/L4 medial nerve block and Left L5 Dorsal Ramus block Dr Mickel Duhamel  . TOTAL HIP ARTHROPLASTY Left 07/25/2016   Procedure: LEFT TOTAL HIP ARTHROPLASTY ANTERIOR APPROACH;  Surgeon: Paralee Cancel, MD;  Location: [facility];  Service: Orthopedics;  Laterality: Left;    SOCIAL HISTORY: Social History   Tobacco Use  . Smoking status: Passive Smoke Exposure - Never Smoker  . Smokeless tobacco: Never Used  . Tobacco comment: As a child  Substance Use Topics  . Alcohol use: No    Alcohol/week: 0.0 oz    Comment: No use in 30 years.  . Drug use: No    FAMILY HISTORY: Family History  Problem Relation Age of Onset  . Alzheimer's disease Mother   . Hyperlipidemia Mother   . Hypertension Mother   . Osteoporosis Mother   . Parkinson's disease Mother   . Migraines Sister   . Allergies Sister   . Hypertension Sister   . Hyperlipidemia Brother   . Cardiomyopathy Brother   . Diabetes type II Brother   . Kidney disease Brother   . Asthma Brother   . Heart disease Brother   . Hypertension Brother   . Hyperlipidemia Brother   . Kidney disease Brother   .  Heart disease Father   . Hyperlipidemia Father   . Hypertension Father   . Aortic aneurysm Father   . Early death Father 15  . Diabetes type II Unknown   . Breast cancer Maternal  Aunt     ROS: Review of Systems  Constitutional: Positive for weight loss.  Gastrointestinal: Negative for nausea and vomiting.  Musculoskeletal:       Negative muscle weakness    PHYSICAL EXAM: Blood pressure 107/70, pulse 79, temperature 98.1 F (36.7 C), temperature source Oral, height 5\' 7"  (1.702 m), weight 180 lb (81.6 kg), SpO2 98 %. Body mass index is 28.19 kg/m. Physical Exam  Constitutional: She is oriented to person, place, and time. She appears well-developed and well-nourished.  Cardiovascular: Normal rate.  Pulmonary/Chest: Effort normal.  Musculoskeletal: Normal range of motion.  Neurological: She is oriented to person, place, and time.  Skin: Skin is warm and dry.  Psychiatric: She has a normal mood and affect. Her behavior is normal.  Vitals reviewed.   RECENT LABS AND TESTS: BMET    Component Value Date/Time   NA 142 07/04/2017 1021   K 4.3 07/04/2017 1021   CL 107 (H) 07/04/2017 1021   CO2 25 07/04/2017 1021   GLUCOSE 83 07/04/2017 1021   GLUCOSE 132 (H) 07/26/2016 0419   BUN 13 07/04/2017 1021   CREATININE 0.75 07/04/2017 1021   CREATININE 0.74 02/24/2016 1158   CALCIUM 9.2 07/04/2017 1021   GFRNONAA 86 07/04/2017 1021   GFRNONAA 88 02/24/2016 1158   GFRAA 99 07/04/2017 1021   GFRAA >89 02/24/2016 1158   Lab Results  Component Value Date   HGBA1C 5.2 07/04/2017   HGBA1C 5.3 03/22/2017   Lab Results  Component Value Date   INSULIN 11.8 07/04/2017   INSULIN 10.5 03/22/2017   CBC    Component Value Date/Time   WBC 6.6 03/22/2017 1057   WBC 15.6 (H) 07/26/2016 0419   RBC 4.40 03/22/2017 1057   RBC 3.73 (L) 07/26/2016 0419   HGB 12.9 03/22/2017 1057   HCT 39.6 03/22/2017 1057   PLT 236 07/26/2016 0419   MCV 90 03/22/2017 1057   MCH 29.3 03/22/2017 1057     MCH 29.2 07/26/2016 0419   MCHC 32.6 03/22/2017 1057   MCHC 32.4 07/26/2016 0419   RDW 14.3 03/22/2017 1057   LYMPHSABS 2.1 03/22/2017 1057   EOSABS 0.1 03/22/2017 1057   BASOSABS 0.0 03/22/2017 1057   Iron/TIBC/Ferritin/ %Sat No results found for: IRON, TIBC, FERRITIN, IRONPCTSAT Lipid Panel     Component Value Date/Time   CHOL 150 07/04/2017 1021   TRIG 119 07/04/2017 1021   HDL 48 07/04/2017 1021   CHOLHDL 3.9 12/14/2014 1036   VLDL 26 12/14/2014 1036   LDLCALC 78 07/04/2017 1021   Hepatic Function Panel     Component Value Date/Time   PROT 6.4 07/04/2017 1021   ALBUMIN 4.3 07/04/2017 1021   AST 16 07/04/2017 1021   ALT 17 07/04/2017 1021   ALKPHOS 86 07/04/2017 1021   BILITOT 0.3 07/04/2017 1021      Component Value Date/Time   TSH 3.880 07/04/2017 1021   TSH 3.190 03/22/2017 1057   TSH 4.000 01/25/2017 1109    ASSESSMENT AND PLAN: Vitamin D deficiency - Plan: Vitamin D, Ergocalciferol, (DRISDOL) 50000 units CAPS capsule  Class 1 obesity with serious comorbidity and body mass index (BMI) of 30.0 to 30.9 in adult, unspecified obesity type - Starting BMI greater than 30   PLAN:  Vitamin D Deficiency Molly Giles was informed that low vitamin D levels contributes to fatigue and are associated with obesity, breast, and colon cancer. She agrees to continue to take prescription Vit D @50 ,000 IU every week #4 with no refills and  will follow up for routine testing of vitamin D, at least 2-3 times per year. She was informed of the risk of over-replacement of vitamin D and agrees to not increase her dose unless he discusses this with [name] first. Molly Giles agrees to follow up with our clinic in 3 to 4 weeks.  Obesity Molly Giles is currently in the action stage of change. As such, her goal is to continue with weight loss efforts She has agreed to keep a food journal with 1100 to 1300 calories and 90+ grams of protein daily Molly Giles has been instructed to work up to a goal of 150 minutes of  combined cardio and strengthening exercise per week for weight loss and overall health benefits. We discussed the following Behavioral Modification Strategies today: work on meal planning and easy cooking plans, holiday eating strategies, celebration eating strategies and emotional eating strategies Molly Giles encouraged she can have small indulgences without going overboard.  Molly Giles has agreed to follow up with our clinic in 3 weeks. She was informed of the importance of frequent follow up visits to maximize her success with intensive lifestyle modifications for her multiple health conditions.  I, Doreene Nest, am acting as transcriptionist for Dennard Nip, MD  I have reviewed the above documentation for accuracy and completeness, and I agree with the above. -Dennard Nip, MD    OBESITY BEHAVIORAL INTERVENTION VISIT  Today's visit was # 10 out of 22.  Starting weight: 204 lbs Starting date: 03/22/17 Today's weight : 180 lbs  Today's date: 08/30/2017 Total lbs lost to date: 24 (Patients must lose 7 lbs in the first 6 months to continue with counseling)   ASK: We discussed the diagnosis of obesity with Molly Giles today and Kilea agreed to give [name] permission to discuss obesity behavioral modification therapy today.  ASSESS: Molly Giles has the diagnosis of obesity and her BMI today is 28.19 Charliene is in the action stage of change   ADVISE: Molly Giles was educated on the multiple health risks of obesity as well as the benefit of weight loss to improve her health. She was advised of the need for long term treatment and the importance of lifestyle modifications.  AGREE: Multiple dietary modification options and treatment options were discussed and  Secret agreed to keep a food journal with 1100 to 1300 calories and 90+ grams of protein daily We discussed the following Behavioral Modification Strategies today: work on meal planning and easy cooking plans, holiday eating strategies, celebration eating  strategies and emotional eating strategies

## 2017-09-03 ENCOUNTER — Other Ambulatory Visit: Payer: Self-pay | Admitting: Family Medicine

## 2017-09-03 DIAGNOSIS — N951 Menopausal and female climacteric states: Secondary | ICD-10-CM

## 2017-09-07 ENCOUNTER — Other Ambulatory Visit: Payer: Self-pay | Admitting: Family Medicine

## 2017-09-07 DIAGNOSIS — Z1231 Encounter for screening mammogram for malignant neoplasm of breast: Secondary | ICD-10-CM

## 2017-09-18 ENCOUNTER — Encounter: Payer: Self-pay | Admitting: Family Medicine

## 2017-09-20 DIAGNOSIS — Z96642 Presence of left artificial hip joint: Secondary | ICD-10-CM | POA: Insufficient documentation

## 2017-09-20 HISTORY — DX: Presence of left artificial hip joint: Z96.642

## 2017-09-24 ENCOUNTER — Ambulatory Visit (INDEPENDENT_AMBULATORY_CARE_PROVIDER_SITE_OTHER): Payer: BLUE CROSS/BLUE SHIELD | Admitting: Family Medicine

## 2017-09-24 VITALS — BP 120/88 | HR 87 | Temp 98.2°F | Ht 67.0 in | Wt 180.0 lb

## 2017-09-24 DIAGNOSIS — E669 Obesity, unspecified: Secondary | ICD-10-CM

## 2017-09-24 DIAGNOSIS — E559 Vitamin D deficiency, unspecified: Secondary | ICD-10-CM

## 2017-09-24 DIAGNOSIS — Z6830 Body mass index (BMI) 30.0-30.9, adult: Secondary | ICD-10-CM | POA: Diagnosis not present

## 2017-09-24 MED ORDER — VITAMIN D (ERGOCALCIFEROL) 1.25 MG (50000 UNIT) PO CAPS: 50000.0000 [IU] | ORAL_CAPSULE | ORAL | 0 refills | Status: DC

## 2017-09-25 NOTE — Progress Notes (Signed)
Office: [phone]  /  Fax: [phone]   HPI:   Chief Complaint: OBESITY Molly Giles is here to discuss her progress with her obesity treatment plan. She is on the keep a food journal with 1100 to 1300 calories and 90+ grams of protein daily and is following her eating plan approximately 60 % of the time. She states she is exercising 0 minutes 0 times per week. Molly Giles did well maintaining weight over the holidays. She would like to lose another 10 to 15 lbs this year. Hunger is controlled, but she is struggling with snacking in bed before fully falling asleep. Her weight is 180 lb (81.6 kg) today and has maintained weight over a period of 3 weeks since her last visit. She has lost 24 lbs since starting treatment with [name].  Vitamin D deficiency Molly Giles has a diagnosis of vitamin D deficiency. She is stable on vit D and is almost at goal. Molly Giles denies nausea, vomiting or muscle weakness.   Ref. Range 07/04/2017 10:21  Vitamin D, 25-Hydroxy Latest Ref Range: 30.0 - 100.0 ng/mL 48.5   ALLERGIES: Allergies  Allergen Reactions  . Sulfa Antibiotics Itching  . Codeine Nausea Only    "head nausea"   . Lyrica [Pregabalin] Other (See Comments)    Dizziness, nausea  . Betadine [Povidone Iodine] Rash    burning  . Hibiclens [Chlorhexidine Gluconate] Other (See Comments)    Burning  . Latex Itching and Rash    burning  . Wellbutrin [Bupropion] Anxiety    MEDICATIONS: Current Outpatient Medications on File Prior to Visit  Medication Sig Dispense Refill  . benzonatate (TESSALON) 200 MG capsule Take 1 capsule (200 mg total) by mouth 3 (three) times daily as needed for cough. 30 capsule 0  . citalopram (CELEXA) 20 MG tablet Take 1 tablet (20 mg total) by mouth daily. 90 tablet 1  . diclofenac sodium (VOLTAREN) 1 % GEL Apply 3 gm to 3 large joints up to 3 times a day.Dispense 3 tubes with 3 refills. 3 Tube 2  . eletriptan (RELPAX) 40 MG tablet Take 1 tablet (40 mg total) by mouth as needed for  migraine. may repeat in 2 hours if necessary 10 tablet 0  . ibuprofen (ADVIL,MOTRIN) 200 MG tablet Take 800 mg by mouth.     . levothyroxine (SYNTHROID, LEVOTHROID) 125 MCG tablet TAKE 1/2 TABLET BY MOUTH DAILY 90 tablet 1  . loratadine (CLARITIN) 10 MG tablet Take 10 mg by mouth daily.    . prednisoLONE acetate (PRED FORTE) 1 % ophthalmic suspension Place 1 drop into both eyes daily.    Marland Kitchen PREMPRO 0.3-1.5 MG tablet TAKE 1 TABLET BY MOUTH AT BEDTIME 84 tablet 3  . Probiotic Product ([place]) Take by mouth.    . rosuvastatin (CRESTOR) 10 MG tablet Take 1 tablet (10 mg total) by mouth daily. 90 tablet 3  . traMADol (ULTRAM) 50 MG tablet Take 2 tablets (100 mg total) by mouth 2 (two) times daily. 120 tablet 0  . zolpidem (AMBIEN) 10 MG tablet Take 1 tablet (10 mg total) by mouth at bedtime as needed. 90 tablet 0   No current facility-administered medications on file prior to visit.     PAST MEDICAL HISTORY: Past Medical History:  Diagnosis Date  . Allergic rhinoconjunctivitis 07/02/2015  . Arthritis   . Benign positional vertigo 04/2015   Responded well to [facility]  . Bruxism (teeth grinding)   . Chronic migraine 02/25/2016   Per Dr Jaynee Eagles review in  notes from [facility] from September 2015. Showed total headache days last month 18. Severe headache days 7 days. Moderate headache days 5 days. Mild headache days last month sick days. Days without headache last month 10 days. Symptoms associated with photophobia, phonophobia, osmophobia, neck pain, dizziness, jaw pain, nasal congestion, vision disturbances, tingling and numbness, weakness and worsening with activity. Each headache attack last 3 hours depending on treatment in severity. Left side, the right side, easier side, the frontal area in the back of the head. Characterized as throbbing, pressure, tightness, squeezing, stabbing and burning   . Chronic migraine w/o aura w/o status migrainosus, not  intractable 07/14/2015  . DDD (degenerative disc disease), lumbar   . Degenerative cervical disc   . Depression   . Disc displacement, lumbar   . Dysrhythmia    seen by dr Wynonia Lawman- not a problem since she has been on Bystolic  . Essential hypertension 05/15/2013  . Family history of adverse reaction to anesthesia    Brother- N/V  . Family history of premature CAD 05/15/2013  . Fibromyalgia   . GERD (gastroesophageal reflux disease) 12/16/2014  . H/O seasonal allergies   . Hammer toe    Left great toe  . Hearing loss of both ears 07/27/2015   mild to borderline moderate low frequency hearing loss improving to within normal limits bilaterally on audiology testing at [facility] in November 2016.    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 History of irritable bowel syndrome 02/15/2016  . History of migraine headaches 05/15/2013   Per Dr Jaynee Eagles review in notes from [facility] from September 2015. Showed total headache days last month 18. Severe headache days 7 days. Moderate headache days 5 days. Mild headache days last month sick days. Days without headache last month 10 days. Symptoms associated with photophobia, phonophobia, osmophobia, neck pain, dizziness, jaw pain, nasal congestion, vision disturbances, tingling and numbness, weakness and worsening with activity. Each headache attack last 3 hours depending on treatment in severity. Left side, the right side, easier side, the frontal area in the back of the head. Characterized as throbbing, pressure, tightness, squeezing, stabbing and burning    . Hx of bad fall 02/2015   Severe Facial/head trauma without fracture  . Hyperlipidemia 1998  . Hypertension 2004  . Hypothyroidism   . Impairment of balance 02/2015   Consequent of postconcussive syndrome  . Interstitial cystitis   . Lumbar facet joint pain   . 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 Migraine 1962    Chronic Migraines  . Mood disorder ([place])   . Musculoskeletal neck pain 07/14/2015  . Normal coronary arteries 05/14/2014  . Osteoarthritis of left hip 11/01/2015   MRI order by Dr Alvan Dame (ortho) 10/2015 showed significant arthritis of left hip joint with cystic changes in femoral head c/w osteoarthritis  . 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 Overweight   . Pain in joint of left shoulder 11/09/2016  . Palpitations 05/15/2013   [facility] manages  . Periodic limb movement sleep disorder 03/28/2017  . Perirectal cyst 05/07/2016  . PONV (postoperative nausea and vomiting)   . Post concussion syndrome 06/06/2015  . Post concussive syndrome 07/14/2015   Ms Leffler's post-concussive syndrome manifesting in vertigo and headache, mood changes, poor balance, dizziness, and decreased concentration per Dr Jaynee Eagles at [facility].   Marland Kitchen Posterior vitreous detachment of right eye 2014  .  Shortness of breath dyspnea    with exertion  . Thyroid nodule 08/11/2009   Findings: The thyroid gland is within normal limits in size.  The gland is diffusely inhomogeneous. A small solid nodule is noted in the lower pole  medially on the right of 7 x 6 x 8 mm. A small solid nodule is noted inferiorly on the left of 3 x 3 x 4 mm.  IMPRESSION:  The thyroid gland is within normal limits in size with only small solid nodules present, the largest of only 8 mm in diameter on the right.    . 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   . Vasomotor symptoms due to menopause 04/19/2017    PAST SURGICAL HISTORY: Past Surgical History:  Procedure Laterality Date  . Bladder dilitation     x 3  . BREAST BIOPSY  2011   Benign histology  . CARDIOVASCULAR STRESS TEST  2000   Unremarkable per pt report  . CARPOMETACARPAL JOINT ARTHROTOMY Right 2011  . COLONOSCOPY    . COLONOSCOPY WITH PROPOFOL N/A 04/21/2015   Procedure:  COLONOSCOPY WITH PROPOFOL;  Surgeon: Carol Ada, MD;  Location: [facility];  Service: Endoscopy;  Laterality: N/A;  . EPIDURAL BLOCK INJECTION Left 04/12/2016   Left Medial Nerve Block and Left L5 ramus block, Dr Suella Broad   . EPIDURAL BLOCK INJECTION  03/21/2016   Left L3-4 medial branch block and Left L5 & dorsal ramus block   . EPIDURAL BLOCK INJECTION N/A 10/25/2016   Suella Broad, MD. Lumbar medial branch block  . EPIDURAL BLOCK INJECTION N/A 02/09/2017   Suella Broad, MD.  Bilateral L3/4 medial branch block, bilateral L5 dorsal ramus block  . EPIDURAL BLOCK INJECTION N/A 07/04/2017   Suella Broad, MD  . FECAL TRANSPLANT  04/21/2015   Procedure: FECAL TRANSPLANT;  Surgeon: Carol Ada, MD;  Location: [facility];  Service: Endoscopy;;  . HIP ARTHROPLASTY Left   . INJECTION HIP INTRA ARTICULAR Left 11/2015   for OA by Dr Suella Broad  . OTHER SURGICAL HISTORY Left 2016   Left L3/L4 medial nerve block and Left L5 Dorsal Ramus block Dr Mickel Duhamel  . TOTAL HIP ARTHROPLASTY Left 07/25/2016   Procedure: LEFT TOTAL HIP ARTHROPLASTY ANTERIOR APPROACH;  Surgeon: Paralee Cancel, MD;  Location: [facility];  Service: Orthopedics;  Laterality: Left;    SOCIAL HISTORY: Social History   Tobacco Use  . Smoking status: Passive Smoke Exposure - Never Smoker  . Smokeless tobacco: Never Used  . Tobacco comment: As a child  Substance Use Topics  . Alcohol use: No    Alcohol/week: 0.0 oz    Comment: No use in 30 years.  . Drug use: No    FAMILY HISTORY: Family History  Problem Relation Age of Onset  . Alzheimer's disease Mother   . Hyperlipidemia Mother   . Hypertension Mother   . Osteoporosis Mother   . Parkinson's disease Mother   . Migraines Sister   . Allergies Sister   . Hypertension Sister   . Hyperlipidemia Brother   . Cardiomyopathy Brother   . Diabetes type II Brother   . Kidney disease Brother   . Asthma Brother   . Heart disease Brother   . Hypertension Brother     . Hyperlipidemia Brother   . Kidney disease Brother   . Heart disease Father   . Hyperlipidemia Father   . Hypertension Father   . Aortic  aneurysm Father   . Early death Father 44  . Diabetes type II Unknown   . Breast cancer Maternal Aunt     ROS: Review of Systems  Constitutional: Negative for weight loss.  Gastrointestinal: Negative for nausea and vomiting.  Musculoskeletal:       Negative muscle weakness    PHYSICAL EXAM: Blood pressure 120/88, pulse 87, temperature 98.2 F (36.8 C), temperature source Oral, height 5\' 7"  (1.702 m), weight 180 lb (81.6 kg), SpO2 90 %. Body mass index is 28.19 kg/m. Physical Exam  Constitutional: She is oriented to person, place, and time. She appears well-developed and well-nourished.  Cardiovascular: Normal rate.  Pulmonary/Chest: Effort normal.  Musculoskeletal: Normal range of motion.  Neurological: She is oriented to person, place, and time.  Skin: Skin is warm and dry.  Psychiatric: She has a normal mood and affect. Her behavior is normal.  Vitals reviewed.   RECENT LABS AND TESTS: BMET    Component Value Date/Time   NA 142 07/04/2017 1021   K 4.3 07/04/2017 1021   CL 107 (H) 07/04/2017 1021   CO2 25 07/04/2017 1021   GLUCOSE 83 07/04/2017 1021   GLUCOSE 132 (H) 07/26/2016 0419   BUN 13 07/04/2017 1021   CREATININE 0.75 07/04/2017 1021   CREATININE 0.74 02/24/2016 1158   CALCIUM 9.2 07/04/2017 1021   GFRNONAA 86 07/04/2017 1021   GFRNONAA 88 02/24/2016 1158   GFRAA 99 07/04/2017 1021   GFRAA >89 02/24/2016 1158   Lab Results  Component Value Date   HGBA1C 5.2 07/04/2017   HGBA1C 5.3 03/22/2017   Lab Results  Component Value Date   INSULIN 11.8 07/04/2017   INSULIN 10.5 03/22/2017   CBC    Component Value Date/Time   WBC 6.6 03/22/2017 1057   WBC 15.6 (H) 07/26/2016 0419   RBC 4.40 03/22/2017 1057   RBC 3.73 (L) 07/26/2016 0419   HGB 12.9 03/22/2017 1057   HCT 39.6 03/22/2017 1057   PLT 236 07/26/2016  0419   MCV 90 03/22/2017 1057   MCH 29.3 03/22/2017 1057   MCH 29.2 07/26/2016 0419   MCHC 32.6 03/22/2017 1057   MCHC 32.4 07/26/2016 0419   RDW 14.3 03/22/2017 1057   LYMPHSABS 2.1 03/22/2017 1057   EOSABS 0.1 03/22/2017 1057   BASOSABS 0.0 03/22/2017 1057   Iron/TIBC/Ferritin/ %Sat No results found for: IRON, TIBC, FERRITIN, IRONPCTSAT Lipid Panel     Component Value Date/Time   CHOL 150 07/04/2017 1021   TRIG 119 07/04/2017 1021   HDL 48 07/04/2017 1021   CHOLHDL 3.9 12/14/2014 1036   VLDL 26 12/14/2014 1036   LDLCALC 78 07/04/2017 1021   Hepatic Function Panel     Component Value Date/Time   PROT 6.4 07/04/2017 1021   ALBUMIN 4.3 07/04/2017 1021   AST 16 07/04/2017 1021   ALT 17 07/04/2017 1021   ALKPHOS 86 07/04/2017 1021   BILITOT 0.3 07/04/2017 1021      Component Value Date/Time   TSH 3.880 07/04/2017 1021   TSH 3.190 03/22/2017 1057   TSH 4.000 01/25/2017 1109     Ref. Range 07/04/2017 10:21  Vitamin D, 25-Hydroxy Latest Ref Range: 30.0 - 100.0 ng/mL 48.5    ASSESSMENT AND PLAN: Vitamin D deficiency - Plan: Vitamin D, Ergocalciferol, (DRISDOL) 50000 units CAPS capsule  Class 1 obesity with serious comorbidity and body mass index (BMI) of 30.0 to 30.9 in adult, unspecified obesity type - Starting BMI greater then 30  PLAN:  Vitamin D  Deficiency Molly Giles was informed that low vitamin D levels contributes to fatigue and are associated with obesity, breast, and colon cancer. She agrees to continue to take prescription Vit D @50 ,000 IU every week #4 with no refills. We will recheck labs in 1 month and will follow up for routine testing of vitamin D, at least 2-3 times per year. She was informed of the risk of over-replacement of vitamin D and agrees to not increase her dose unless she discusses this with [name] first. Molly Giles agrees to follow up with our clinic in 2 to 3 weeks.  Obesity Molly Giles is currently in the action stage of change. As such, her goal is to  continue with weight loss efforts She has agreed to portion control better and make smarter food choices, such as increase vegetables and decrease simple carbohydrates  and keep a food journal with 1200 to 1500 calories and 80 grams of protein daily Molly Giles has been instructed to work up to a goal of 150 minutes of combined cardio and strengthening exercise per week or start strengthening exercise 15 minutes per day for weight loss and overall health benefits. We discussed the following Behavioral Modification Strategies today: increasing lean protein intake, decreasing simple carbohydrates  and ways to avoid night time snacking  Molly Giles has agreed to follow up with our clinic in 2 to 3 weeks. She was informed of the importance of frequent follow up visits to maximize her success with intensive lifestyle modifications for her multiple health conditions.   OBESITY BEHAVIORAL INTERVENTION VISIT  Today's visit was # 11 out of 22.  Starting weight: 204 lbs Starting date: 03/22/17 Today's weight : 180 lbs  Today's date: 09/24/2017 Total lbs lost to date: 24 (Patients must lose 7 lbs in the first 6 months to continue with counseling)   ASK: We discussed the diagnosis of obesity with Molly Giles today and Molly Giles agreed to give [name] permission to discuss obesity behavioral modification therapy today.  ASSESS: Molly Giles has the diagnosis of obesity and her BMI today is 28.19 Molly Giles is in the action stage of change   ADVISE: Molly Giles was educated on the multiple health risks of obesity as well as the benefit of weight loss to improve her health. She was advised of the need for long term treatment and the importance of lifestyle modifications.  AGREE: Multiple dietary modification options and treatment options were discussed and  Molly Giles agreed to the above obesity treatment plan.  I, Doreene Nest, am acting as transcriptionist for Dennard Nip, MD  I have reviewed the above documentation for accuracy and  completeness, and I agree with the above. -Dennard Nip, MD

## 2017-10-04 ENCOUNTER — Ambulatory Visit
Admission: RE | Admit: 2017-10-04 | Discharge: 2017-10-04 | Disposition: A | Discharge status: Home / Self Care | Payer: BLUE CROSS/BLUE SHIELD | Source: Ambulatory Visit | Attending: Family Medicine | Admitting: Family Medicine

## 2017-10-04 DIAGNOSIS — Z1231 Encounter for screening mammogram for malignant neoplasm of breast: Secondary | ICD-10-CM

## 2017-10-09 ENCOUNTER — Ambulatory Visit (INDEPENDENT_AMBULATORY_CARE_PROVIDER_SITE_OTHER): Payer: BLUE CROSS/BLUE SHIELD | Admitting: Family Medicine

## 2017-10-09 VITALS — BP 119/85 | HR 76 | Temp 98.1°F | Ht 67.0 in | Wt 180.0 lb

## 2017-10-09 DIAGNOSIS — E669 Obesity, unspecified: Secondary | ICD-10-CM

## 2017-10-09 DIAGNOSIS — Z6830 Body mass index (BMI) 30.0-30.9, adult: Secondary | ICD-10-CM

## 2017-10-09 DIAGNOSIS — E7849 Other hyperlipidemia: Secondary | ICD-10-CM | POA: Diagnosis not present

## 2017-10-09 NOTE — Progress Notes (Signed)
Office: [phone]  /  Fax: [phone]   HPI:   Chief Complaint: OBESITY Molly Giles is here to discuss her progress with her obesity treatment plan. She is on the portion control better and make smarter food choices and keep a food journal with 1200 to 1500 calories and 80 grams of protein daily. Molly Giles is following her eating plan approximately 50 % of the time. She states she is walking for 20 minutes 2 times per week. Molly Giles has done well maintaining weight, but has struggled with increasing snacking, and calories are too high. Her weight is 180 lb (81.6 kg) today and has maintained weight over a period of 2 weeks since her last visit. She has lost 24 lbs since starting treatment with [name].  Hyperlipidemia Molly Giles has hyperlipidemia and is on crestor. She is doing well on her diet prescription. Molly Giles has been trying to improve her cholesterol levels with intensive lifestyle modification including a low saturated fat diet, exercise and weight loss. She denies any chest pain or myalgias.  ALLERGIES: Allergies  Allergen Reactions  . Sulfa Antibiotics Itching  . Codeine Nausea Only    "head nausea"   . Lyrica [Pregabalin] Other (See Comments)    Dizziness, nausea  . Betadine [Povidone Iodine] Rash    burning  . Hibiclens [Chlorhexidine Gluconate] Other (See Comments)    Burning  . Latex Itching and Rash    burning  . Wellbutrin [Bupropion] Anxiety    MEDICATIONS: Current Outpatient Medications on File Prior to Visit  Medication Sig Dispense Refill  . citalopram (CELEXA) 20 MG tablet Take 1 tablet (20 mg total) by mouth daily. 90 tablet 1  . diclofenac sodium (VOLTAREN) 1 % GEL Apply 3 gm to 3 large joints up to 3 times a day.Dispense 3 tubes with 3 refills. 3 Tube 2  . eletriptan (RELPAX) 40 MG tablet Take 1 tablet (40 mg total) by mouth as needed for migraine. may repeat in 2 hours if necessary 10 tablet 0  . ibuprofen (ADVIL,MOTRIN) 200 MG tablet Take 800 mg by mouth.     .  levothyroxine (SYNTHROID, LEVOTHROID) 125 MCG tablet TAKE 1/2 TABLET BY MOUTH DAILY 90 tablet 1  . loratadine (CLARITIN) 10 MG tablet Take 10 mg by mouth daily.    . prednisoLONE acetate (PRED FORTE) 1 % ophthalmic suspension Place 1 drop into both eyes daily.    Marland Kitchen PREMPRO 0.3-1.5 MG tablet TAKE 1 TABLET BY MOUTH AT BEDTIME 84 tablet 3  . Probiotic Product ([place]) Take by mouth.    . rosuvastatin (CRESTOR) 10 MG tablet Take 1 tablet (10 mg total) by mouth daily. 90 tablet 3  . traMADol (ULTRAM) 50 MG tablet Take 2 tablets (100 mg total) by mouth 2 (two) times daily. 120 tablet 0  . Vitamin D, Ergocalciferol, (DRISDOL) 50000 units CAPS capsule Take 1 capsule (50,000 Units total) by mouth every 7 (seven) days. 4 capsule 0  . zolpidem (AMBIEN) 10 MG tablet Take 1 tablet (10 mg total) by mouth at bedtime as needed. 90 tablet 0   No current facility-administered medications on file prior to visit.     PAST MEDICAL HISTORY: Past Medical History:  Diagnosis Date  . Allergic rhinoconjunctivitis 07/02/2015  . Arthritis   . Benign positional vertigo 04/2015   Responded well to [facility]  . Bruxism (teeth grinding)   . Chronic migraine 02/25/2016   Per Dr Jaynee Eagles review in notes from [facility] from September 2015. Showed total headache  days last month 18. Severe headache days 7 days. Moderate headache days 5 days. Mild headache days last month sick days. Days without headache last month 10 days. Symptoms associated with photophobia, phonophobia, osmophobia, neck pain, dizziness, jaw pain, nasal congestion, vision disturbances, tingling and numbness, weakness and worsening with activity. Each headache attack last 3 hours depending on treatment in severity. Left side, the right side, easier side, the frontal area in the back of the head. Characterized as throbbing, pressure, tightness, squeezing, stabbing and burning   . Chronic migraine w/o aura w/o status  migrainosus, not intractable 07/14/2015  . DDD (degenerative disc disease), lumbar   . Degenerative cervical disc   . Depression   . Disc displacement, lumbar   . Dysrhythmia    seen by dr Wynonia Lawman- not a problem since she has been on Bystolic  . Essential hypertension 05/15/2013  . Family history of adverse reaction to anesthesia    Brother- N/V  . Family history of premature CAD 05/15/2013  . Fibromyalgia   . GERD (gastroesophageal reflux disease) 12/16/2014  . H/O seasonal allergies   . Hammer toe    Left great toe  . Hearing loss of both ears 07/27/2015   mild to borderline moderate low frequency hearing loss improving to within normal limits bilaterally on audiology testing at [facility] in November 2016.    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 History of irritable bowel syndrome 02/15/2016  . History of migraine headaches 05/15/2013   Per Dr Jaynee Eagles review in notes from [facility] from September 2015. Showed total headache days last month 18. Severe headache days 7 days. Moderate headache days 5 days. Mild headache days last month sick days. Days without headache last month 10 days. Symptoms associated with photophobia, phonophobia, osmophobia, neck pain, dizziness, jaw pain, nasal congestion, vision disturbances, tingling and numbness, weakness and worsening with activity. Each headache attack last 3 hours depending on treatment in severity. Left side, the right side, easier side, the frontal area in the back of the head. Characterized as throbbing, pressure, tightness, squeezing, stabbing and burning    . Hx of bad fall 02/2015   Severe Facial/head trauma without fracture  . Hyperlipidemia 1998  . Hypertension 2004  . Hypothyroidism   . Impairment of balance 02/2015   Consequent of postconcussive syndrome  . Interstitial cystitis   . Lumbar facet joint pain   . 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  Migraine 1962   Chronic Migraines  . Mood disorder ([place])   . Musculoskeletal neck pain 07/14/2015  . Normal coronary arteries 05/14/2014  . Osteoarthritis of left hip 11/01/2015   MRI order by Dr Alvan Dame (ortho) 10/2015 showed significant arthritis of left hip joint with cystic changes in femoral head c/w osteoarthritis  . 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 Overweight   . Pain in joint of left shoulder 11/09/2016  . Palpitations 05/15/2013   [facility] manages  . Periodic limb movement sleep disorder 03/28/2017  . Perirectal cyst 05/07/2016  . PONV (postoperative nausea and vomiting)   . Post concussion syndrome 06/06/2015  . Post concussive syndrome 07/14/2015   Ms Forbush's post-concussive syndrome manifesting in vertigo and headache, mood changes, poor balance, dizziness, and decreased concentration per Dr Jaynee Eagles at [facility].   Marland Kitchen Posterior vitreous detachment of right eye 2014  . Shortness of breath dyspnea    with exertion  .  Thyroid nodule 08/11/2009   Findings: The thyroid gland is within normal limits in size.  The gland is diffusely inhomogeneous. A small solid nodule is noted in the lower pole  medially on the right of 7 x 6 x 8 mm. A small solid nodule is noted inferiorly on the left of 3 x 3 x 4 mm.  IMPRESSION:  The thyroid gland is within normal limits in size with only small solid nodules present, the largest of only 8 mm in diameter on the right.    . 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   . Vasomotor symptoms due to menopause 04/19/2017    PAST SURGICAL HISTORY: Past Surgical History:  Procedure Laterality Date  . Bladder dilitation     x 3  . BREAST BIOPSY  2011   Benign histology  . CARDIOVASCULAR STRESS TEST  2000   Unremarkable per pt report  . CARPOMETACARPAL JOINT ARTHROTOMY Right 2011  . COLONOSCOPY    . COLONOSCOPY WITH PROPOFOL N/A 04/21/2015     Procedure: COLONOSCOPY WITH PROPOFOL;  Surgeon: Carol Ada, MD;  Location: [place];  Service: Endoscopy;  Laterality: N/A;  . EPIDURAL BLOCK INJECTION Left 04/12/2016   Left Medial Nerve Block and Left L5 ramus block, Dr Suella Broad   . EPIDURAL BLOCK INJECTION  03/21/2016   Left L3-4 medial branch block and Left L5 & dorsal ramus block   . EPIDURAL BLOCK INJECTION N/A 10/25/2016   Suella Broad, MD. Lumbar medial branch block  . EPIDURAL BLOCK INJECTION N/A 02/09/2017   Suella Broad, MD.  Bilateral L3/4 medial branch block, bilateral L5 dorsal ramus block  . EPIDURAL BLOCK INJECTION N/A 07/04/2017   Suella Broad, MD  . FECAL TRANSPLANT  04/21/2015   Procedure: FECAL TRANSPLANT;  Surgeon: Carol Ada, MD;  Location: [place];  Service: Endoscopy;;  . HIP ARTHROPLASTY Left   . INJECTION HIP INTRA ARTICULAR Left 11/2015   for OA by Dr Suella Broad  . OTHER SURGICAL HISTORY Left 2016   Left L3/L4 medial nerve block and Left L5 Dorsal Ramus block Dr Mickel Duhamel  . TOTAL HIP ARTHROPLASTY Left 07/25/2016   Procedure: LEFT TOTAL HIP ARTHROPLASTY ANTERIOR APPROACH;  Surgeon: Paralee Cancel, MD;  Location: [facility];  Service: Orthopedics;  Laterality: Left;    SOCIAL HISTORY: Social History   Tobacco Use  . Smoking status: Passive Smoke Exposure - Never Smoker  . Smokeless tobacco: Never Used  . Tobacco comment: As a child  Substance Use Topics  . Alcohol use: No    Alcohol/week: 0.0 oz    Comment: No use in 30 years.  . Drug use: No    FAMILY HISTORY: Family History  Problem Relation Age of Onset  . Alzheimer's disease Mother   . Hyperlipidemia Mother   . Hypertension Mother   . Osteoporosis Mother   . Parkinson's disease Mother   . Migraines Sister   . Allergies Sister   . Hypertension Sister   . Hyperlipidemia Brother   . Cardiomyopathy Brother   . Diabetes type II Brother   . Kidney disease Brother   . Asthma Brother   . Heart disease Brother   .  Hypertension Brother   . Hyperlipidemia Brother   . Kidney disease Brother   . Heart disease Father   . Hyperlipidemia Father   . Hypertension Father   . Aortic aneurysm Father   . Early death Father 50  .  Diabetes type II Unknown   . Breast cancer Maternal Aunt     ROS: Review of Systems  Constitutional: Negative for weight loss.  Cardiovascular: Negative for chest pain.  Musculoskeletal: Negative for myalgias.    PHYSICAL EXAM: Blood pressure 119/85, pulse 76, temperature 98.1 F (36.7 C), temperature source Oral, height 5\' 7"  (1.702 m), weight 180 lb (81.6 kg), SpO2 97 %. Body mass index is 28.19 kg/m. Physical Exam  Constitutional: She is oriented to person, place, and time. She appears well-developed and well-nourished.  Cardiovascular: Normal rate.  Pulmonary/Chest: Effort normal.  Musculoskeletal: Normal range of motion.  Neurological: She is oriented to person, place, and time.  Skin: Skin is warm and dry.  Psychiatric: She has a normal mood and affect. Her behavior is normal.  Vitals reviewed.   RECENT LABS AND TESTS: BMET    Component Value Date/Time   NA 142 07/04/2017 1021   K 4.3 07/04/2017 1021   CL 107 (H) 07/04/2017 1021   CO2 25 07/04/2017 1021   GLUCOSE 83 07/04/2017 1021   GLUCOSE 132 (H) 07/26/2016 0419   BUN 13 07/04/2017 1021   CREATININE 0.75 07/04/2017 1021   CREATININE 0.74 02/24/2016 1158   CALCIUM 9.2 07/04/2017 1021   GFRNONAA 86 07/04/2017 1021   GFRNONAA 88 02/24/2016 1158   GFRAA 99 07/04/2017 1021   GFRAA >89 02/24/2016 1158   Lab Results  Component Value Date   HGBA1C 5.2 07/04/2017   HGBA1C 5.3 03/22/2017   Lab Results  Component Value Date   INSULIN 11.8 07/04/2017   INSULIN 10.5 03/22/2017   CBC    Component Value Date/Time   WBC 6.6 03/22/2017 1057   WBC 15.6 (H) 07/26/2016 0419   RBC 4.40 03/22/2017 1057   RBC 3.73 (L) 07/26/2016 0419   HGB 12.9 03/22/2017 1057   HCT 39.6 03/22/2017 1057   PLT 236  07/26/2016 0419   MCV 90 03/22/2017 1057   MCH 29.3 03/22/2017 1057   MCH 29.2 07/26/2016 0419   MCHC 32.6 03/22/2017 1057   MCHC 32.4 07/26/2016 0419   RDW 14.3 03/22/2017 1057   LYMPHSABS 2.1 03/22/2017 1057   EOSABS 0.1 03/22/2017 1057   BASOSABS 0.0 03/22/2017 1057   Iron/TIBC/Ferritin/ %Sat No results found for: IRON, TIBC, FERRITIN, IRONPCTSAT Lipid Panel     Component Value Date/Time   CHOL 150 07/04/2017 1021   TRIG 119 07/04/2017 1021   HDL 48 07/04/2017 1021   CHOLHDL 3.9 12/14/2014 1036   VLDL 26 12/14/2014 1036   LDLCALC 78 07/04/2017 1021   Hepatic Function Panel     Component Value Date/Time   PROT 6.4 07/04/2017 1021   ALBUMIN 4.3 07/04/2017 1021   AST 16 07/04/2017 1021   ALT 17 07/04/2017 1021   ALKPHOS 86 07/04/2017 1021   BILITOT 0.3 07/04/2017 1021      Component Value Date/Time   TSH 3.880 07/04/2017 1021   TSH 3.190 03/22/2017 1057   TSH 4.000 01/25/2017 1109    ASSESSMENT AND PLAN: Other hyperlipidemia  Class 1 obesity with serious comorbidity and body mass index (BMI) of 30.0 to 30.9 in adult, unspecified obesity type - Starting BMI greater then 30  PLAN:  Hyperlipidemia Molly Giles was informed of the American Heart Association Guidelines emphasizing intensive lifestyle modifications as the first line treatment for hyperlipidemia. We discussed many lifestyle modifications today in depth, and Molly Giles will continue to work on decreasing saturated fats such as fatty red meat, butter and many fried foods. She will  also increase vegetables and lean protein in her diet and continue to work on exercise and weight loss efforts. Molly Giles will continue crestor and we will recheck labs at next visit.  We spent > than 50% of the 15 minute visit on the counseling as documented in the note.  Obesity Molly Giles is currently in the action stage of change. As such, her goal is to continue with weight loss efforts She has agreed to keep a food journal with 1200 to 1500  calories and 80 grams of protein daily Molly Giles has been instructed to work up to a goal of 150 minutes of combined cardio and strengthening exercise per week for weight loss and overall health benefits. We discussed the following Behavioral Modification Strategies today: keep a strict food journal, increasing lean protein intake, decrease eating out and dealing with family or coworker sabotage  Molly Giles has agreed to follow up with our clinic in 3 weeks. She was informed of the importance of frequent follow up visits to maximize her success with intensive lifestyle modifications for her multiple health conditions.   OBESITY BEHAVIORAL INTERVENTION VISIT  Today's visit was # 12 out of 22.  Starting weight: 204 lbs Starting date: 03/22/17 Today's weight : 180 lbs Today's date: 10/09/2017 Total lbs lost to date: 24 (Patients must lose 7 lbs in the first 6 months to continue with counseling)   ASK: We discussed the diagnosis of obesity with Molly Giles today and Molly Giles agreed to give [name] permission to discuss obesity behavioral modification therapy today.  ASSESS: Molly Giles has the diagnosis of obesity and her BMI today is 28.19 Molly Giles is in the action stage of change   ADVISE: Molly Giles was educated on the multiple health risks of obesity as well as the benefit of weight loss to improve her health. She was advised of the need for long term treatment and the importance of lifestyle modifications.  AGREE: Multiple dietary modification options and treatment options were discussed and  Molly Giles agreed to the above obesity treatment plan.  I, Doreene Nest, am acting as transcriptionist for Dennard Nip, MD  I have reviewed the above documentation for accuracy and completeness, and I agree with the above. -Dennard Nip, MD

## 2017-10-30 ENCOUNTER — Ambulatory Visit (INDEPENDENT_AMBULATORY_CARE_PROVIDER_SITE_OTHER): Payer: BLUE CROSS/BLUE SHIELD | Admitting: Physician Assistant

## 2017-10-30 ENCOUNTER — Encounter (INDEPENDENT_AMBULATORY_CARE_PROVIDER_SITE_OTHER): Payer: Self-pay

## 2017-11-06 ENCOUNTER — Encounter: Payer: Self-pay | Admitting: Family Medicine

## 2017-11-27 ENCOUNTER — Encounter: Payer: Self-pay | Admitting: Neurology

## 2017-11-27 ENCOUNTER — Ambulatory Visit: Payer: BLUE CROSS/BLUE SHIELD | Admitting: Neurology

## 2017-11-27 VITALS — BP 110/68 | HR 80 | Ht 66.5 in | Wt 183.2 lb

## 2017-11-27 DIAGNOSIS — G43709 Chronic migraine without aura, not intractable, without status migrainosus: Secondary | ICD-10-CM | POA: Diagnosis not present

## 2017-11-27 DIAGNOSIS — G43711 Chronic migraine without aura, intractable, with status migrainosus: Secondary | ICD-10-CM | POA: Diagnosis not present

## 2017-11-27 MED ORDER — KETOROLAC TROMETHAMINE 60 MG/2ML IM SOLN
30.0000 mg | Freq: Once | INTRAMUSCULAR | Status: AC
  Administered 2017-11-27: 30 mg via INTRAMUSCULAR

## 2017-11-27 MED ORDER — FREMANEZUMAB-VFRM 225 MG/1.5ML ~~LOC~~ SOSY: 225.0000 mg | PREFILLED_SYRINGE | SUBCUTANEOUS | 11 refills | Status: DC

## 2017-11-27 MED ORDER — FREMANEZUMAB-VFRM 225 MG/1.5ML ~~LOC~~ SOSY
225.0000 mg | PREFILLED_SYRINGE | Freq: Once | SUBCUTANEOUS | Status: AC
  Administered 2017-11-27: 225 mg via SUBCUTANEOUS
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
## 2017-11-27 NOTE — Progress Notes (Signed)
Pt given Toradol 30 mg injection in the R deltoid. Pt tolerated well, gauze and paper tape applied to site d/t latex allergy. See MAR.

## 2017-11-28 DIAGNOSIS — G43711 Chronic migraine without aura, intractable, with status migrainosus: Secondary | ICD-10-CM | POA: Insufficient documentation

## 2017-11-29 DIAGNOSIS — T84038A Mechanical loosening of other internal prosthetic joint, initial encounter: Secondary | ICD-10-CM | POA: Insufficient documentation

## 2017-11-29 DIAGNOSIS — Z96649 Presence of unspecified artificial hip joint: Secondary | ICD-10-CM

## 2017-11-29 HISTORY — DX: Mechanical loosening of other internal prosthetic joint, initial encounter: Z96.649

## 2017-11-29 HISTORY — DX: Presence of unspecified artificial hip joint: T84.038A

## 2017-12-10 ENCOUNTER — Encounter: Payer: Self-pay | Admitting: Family Medicine

## 2017-12-10 NOTE — Progress Notes (Unsigned)
OV with Dr Rolena Infante (ortho) 11/27/17 Unrelieved low back pain. Recommend intra-disc inject L4-5.  Referred to Dr Suella Broad for procedure.

## 2017-12-13 NOTE — Progress Notes (Deleted)
Office Visit Note  Patient: Molly Giles             Date of Birth: 15-Jan-1956           MRN: [id number]             PCP: McDiarmid, Blane Ohara, MD Referring: McDiarmid, Blane Ohara, MD Visit Date: 12/27/2017 Occupation: @GUAROCC @    Subjective:  No chief complaint on file.   History of Present Illness: Molly Giles is a 62 y.o. female ***   Activities of Daily Living:  Patient reports morning stiffness for *** {minute/hour:19697}.   Patient {ACTIONS;DENIES/REPORTS:21021675::"Denies"} nocturnal pain.  Difficulty dressing/grooming: {ACTIONS;DENIES/REPORTS:21021675::"Denies"} Difficulty climbing stairs: {ACTIONS;DENIES/REPORTS:21021675::"Denies"} Difficulty getting out of chair: {ACTIONS;DENIES/REPORTS:21021675::"Denies"} Difficulty using hands for taps, buttons, cutlery, and/or writing: {ACTIONS;DENIES/REPORTS:21021675::"Denies"}   No Rheumatology ROS completed.   PMFS History:  Patient Active Problem List   Diagnosis Date Noted  . Chronic migraine without aura, with intractable migraine, so stated, with status migrainosus 11/28/2017  . URI (upper respiratory infection) 08/14/2017  . Fatigue 07/05/2017  . Insulin resistance 07/04/2017  . Vitamin D deficiency 05/08/2017  . Class 1 obesity without serious comorbidity with body mass index (BMI) of 30.0 to 30.9 in adult 04/24/2017  . Vasomotor symptoms due to menopause 04/19/2017  . Periodic limb movement sleep disorder 03/28/2017  . Nocturnal hypoxemia 03/06/2017  . Morbid obesity ([place]) 03/06/2017  . Snoring 03/06/2017  . Gasping for breath 03/06/2017  . Sleep walking and eating 03/06/2017  . Episodic cluster headache, not intractable 03/06/2017  . Obesity (BMI 30.0-34.9) 01/29/2017  . Other insomnia 11/09/2016  . S/P left THA, AA 07/25/2016  . Irritable bowel syndrome with diarrhea 04/03/2016  . Chronic migraine 02/25/2016  . Left ventricular hypertrophy, mild 02/25/2016  . History of irritable bowel syndrome 02/15/2016  .  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 Osteoarthritis of left hip 11/01/2015  . Hearing loss of both ears 07/27/2015  . Post concussive syndrome 07/14/2015  . Depression (NOS) 07/02/2015  . Allergic rhinoconjunctivitis 07/02/2015  . Fibromyalgia syndrome 07/01/2015  . History of Clostridium difficile colitis, persistent 07/01/2015  . Impairment of balance 02/10/2015  . Essential hypertension 05/15/2013  . Hypothyroidism 05/15/2013  . Hyperlipidemia 05/15/2013  . Interstitial cystitis 05/15/2013  . Chronic migraine without aura 05/15/2013  . Spondylosis of lumbar region without myelopathy or radiculopathy 10/30/2011    Past Medical History:  Diagnosis Date  . Allergic rhinoconjunctivitis 07/02/2015  . Arthritis   . Benign positional vertigo 04/2015   Responded well to [facility] Rehab  . Bruxism (teeth grinding)   . Chronic migraine 02/25/2016   Per Dr Jaynee Eagles review in notes from [facility] from September 2015. Showed total headache days last month 18. Severe headache days 7 days. Moderate headache days 5 days. Mild headache days last month sick days. Days without headache last month 10 days. Symptoms associated with photophobia, phonophobia, osmophobia, neck pain, dizziness, jaw pain, nasal congestion, vision disturbances, tingling and numbness, weakness and worsening with activity. Each headache attack last 3 hours depending on treatment in severity. Left side, the right side, easier side, the frontal area in the back of the head. Characterized as throbbing, pressure, tightness, squeezing, stabbing and burning   . Chronic migraine w/o aura w/o status migrainosus, not intractable 07/14/2015  . DDD (degenerative disc disease), lumbar   . Degenerative cervical disc   . Depression   . Disc displacement, lumbar   . Dysrhythmia    seen by dr Wynonia Lawman- not  a problem since she has been on Bystolic  . Essential hypertension 05/15/2013  . Family history of adverse  reaction to anesthesia    Brother- N/V  . Family history of premature CAD 05/15/2013  . Fibromyalgia   . GERD (gastroesophageal reflux disease) 12/16/2014  . H/O seasonal allergies   . Hammer toe    Left great toe  . Hearing loss of both ears 07/27/2015   mild to borderline moderate low frequency hearing loss improving to within normal limits bilaterally on audiology testing at [facility] in November 2016.    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 History of irritable bowel syndrome 02/15/2016  . History of migraine headaches 05/15/2013   Per Dr Jaynee Eagles review in notes from [facility] from September 2015. Showed total headache days last month 18. Severe headache days 7 days. Moderate headache days 5 days. Mild headache days last month sick days. Days without headache last month 10 days. Symptoms associated with photophobia, phonophobia, osmophobia, neck pain, dizziness, jaw pain, nasal congestion, vision disturbances, tingling and numbness, weakness and worsening with activity. Each headache attack last 3 hours depending on treatment in severity. Left side, the right side, easier side, the frontal area in the back of the head. Characterized as throbbing, pressure, tightness, squeezing, stabbing and burning    . Hx of bad fall 02/2015   Severe Facial/head trauma without fracture  . Hyperlipidemia 1998  . Hypertension 2004  . Hypothyroidism   . Impairment of balance 02/2015   Consequent of postconcussive syndrome  . Interstitial cystitis   . Lumbar facet joint pain   . 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 Migraine 1962   Chronic Migraines  . Mood disorder ([place])   . Musculoskeletal neck pain 07/14/2015  . Normal coronary arteries 05/14/2014  . Osteoarthritis of left hip 11/01/2015   MRI order by Dr Alvan Dame (ortho) 10/2015 showed significant arthritis of left hip joint with cystic changes in femoral head c/w  osteoarthritis  . 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 Overweight   . Pain in joint of left shoulder 11/09/2016  . Palpitations 05/15/2013   [facility] manages  . Periodic limb movement sleep disorder 03/28/2017  . Perirectal cyst 05/07/2016  . PONV (postoperative nausea and vomiting)   . Post concussion syndrome 06/06/2015  . Post concussive syndrome 07/14/2015   Ms Neighbors's post-concussive syndrome manifesting in vertigo and headache, mood changes, poor balance, dizziness, and decreased concentration per Dr Jaynee Eagles at [facility].   Marland Kitchen Posterior vitreous detachment of right eye 2014  . Shortness of breath dyspnea    with exertion  . Thyroid nodule 08/11/2009   Findings: The thyroid gland is within normal limits in size.  The gland is diffusely inhomogeneous. A small solid nodule is noted in the lower pole  medially on the right of 7 x 6 x 8 mm. A small solid nodule is noted inferiorly on the left of 3 x 3 x 4 mm.  IMPRESSION:  The thyroid gland is within normal limits in size with only small solid nodules present, the largest of only 8 mm in diameter on the right.    . 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   . Vasomotor symptoms due to menopause 04/19/2017    Family History  Problem Relation Age of Onset  . Alzheimer's disease  Mother   . Hyperlipidemia Mother   . Hypertension Mother   . Osteoporosis Mother   . Parkinson's disease Mother   . Migraines Sister   . Allergies Sister   . Hypertension Sister   . Hyperlipidemia Brother   . Cardiomyopathy Brother   . Diabetes type II Brother   . Kidney disease Brother   . Asthma Brother   . Heart disease Brother   . Hypertension Brother   . Hyperlipidemia Brother   . Kidney disease Brother   . Heart disease Father   . Hyperlipidemia Father   . Hypertension Father   . Aortic aneurysm Father   . Early death Father 41  .  Diabetes type II Unknown   . Breast cancer Maternal Aunt    Past Surgical History:  Procedure Laterality Date  . Bladder dilitation     x 3  . BREAST BIOPSY  2011   Benign histology  . CARDIOVASCULAR STRESS TEST  2000   Unremarkable per pt report  . CARPOMETACARPAL JOINT ARTHROTOMY Right 2011  . COLONOSCOPY    . COLONOSCOPY WITH PROPOFOL N/A 04/21/2015   Procedure: COLONOSCOPY WITH PROPOFOL;  Surgeon: Carol Ada, MD;  Location: [place];  Service: Endoscopy;  Laterality: N/A;  . EPIDURAL BLOCK INJECTION Left 04/12/2016   Left Medial Nerve Block and Left L5 ramus block, Dr Suella Broad   . EPIDURAL BLOCK INJECTION  03/21/2016   Left L3-4 medial branch block and Left L5 & dorsal ramus block   . EPIDURAL BLOCK INJECTION N/A 10/25/2016   Suella Broad, MD. Lumbar medial branch block  . EPIDURAL BLOCK INJECTION N/A 02/09/2017   Suella Broad, MD.  Bilateral L3/4 medial branch block, bilateral L5 dorsal ramus block  . EPIDURAL BLOCK INJECTION N/A 07/04/2017   Suella Broad, MD  . FECAL TRANSPLANT  04/21/2015   Procedure: FECAL TRANSPLANT;  Surgeon: Carol Ada, MD;  Location: [place];  Service: Endoscopy;;  . HIP ARTHROPLASTY Left   . INJECTION HIP INTRA ARTICULAR Left 11/2015   for OA by Dr Suella Broad  . OTHER SURGICAL HISTORY Left 2016   Left L3/L4 medial nerve block and Left L5 Dorsal Ramus block Dr Mickel Duhamel  . TOTAL HIP ARTHROPLASTY Left 07/25/2016   Procedure: LEFT TOTAL HIP ARTHROPLASTY ANTERIOR APPROACH;  Surgeon: Paralee Cancel, MD;  Location: [facility];  Service: Orthopedics;  Laterality: Left;   Social History   Social History Narrative   Prior PCP With [facility] [place], [place].   Married, lives with [place], (b. 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    Wears seatbelt usually   No religious beliefs affecting healthcare   No difficulty taking medications as directed.         Home has working smoke alarm   No home throw rugs   Does not have nonslip bathtub / shower    Has railings on all stairs   Home is free from [place].    Right-handed.      No history of Hospitalization as of 07/2016.       Best number to reach patient [phone] (M) as of 07/01/15   It is permissible to leave messages as of 07/01/15 .    No regular exercise of 3 times a week for 30 minutes at a time      Lateia Fraser has Advanced Directive and a Doctor, general practice is husband,  Leiann Sporer [phone]      Caffeine: 3 cups coffee per day, sometimes 2-3 cups of tea     Objective: Vital Signs: There were no vitals taken for this visit.   Physical Exam   Musculoskeletal Exam: ***  CDAI Exam: No CDAI exam completed.    Investigation: No additional findings. CBC Latest Ref Rng & Units 03/22/2017 07/26/2016 07/17/2016  WBC 3.4 - 10.8 x10E3/uL 6.6 15.6(H) 8.2  Hemoglobin 11.1 - 15.9 g/dL 12.9 10.9(L) 13.0  Hematocrit 34.0 - 46.6 % 39.6 33.6(L) 40.6  Platelets 150 - 400 K/uL - 236 294   CMP Latest Ref Rng & Units 07/04/2017 03/22/2017 01/25/2017  Glucose 65 - 99 mg/dL 83 76 73  BUN 8 - 27 mg/dL 13 7(L) 18  Creatinine 0.57 - 1.00 mg/dL 0.75 0.79 0.89  Sodium 134 - 144 mmol/L 142 142 142  Potassium 3.5 - 5.2 mmol/L 4.3 4.8 4.3  Chloride 96 - 106 mmol/L 107(H) 103 107(H)  CO2 20 - 29 mmol/L 25 25 22   Calcium 8.7 - 10.3 mg/dL 9.2 9.4 9.9  Total Protein 6.0 - 8.5 g/dL 6.4 6.5 -  Total Bilirubin 0.0 - 1.2 mg/dL 0.3 0.3 -  Alkaline Phos 39 - 117 IU/L 86 90 -  AST 0 - 40 IU/L 16 33 -  ALT 0 - 32 IU/L 17 46(H) -    Imaging: No results found.  Speciality Comments: No specialty comments available.    Procedures:  No procedures performed Allergies: Sulfa antibiotics; Codeine; Lyrica [pregabalin]; Methylprednisolone; Prednisone; Betadine [povidone iodine]; Hibiclens [chlorhexidine gluconate]; Latex; and Wellbutrin [bupropion]   Assessment / Plan:     Visit  Diagnoses: Fibromyalgia syndrome  Other fatigue  Other insomnia - Ambien 10 mg at bedtime  Primary osteoarthritis of both hands  Trochanteric bursitis, left hip  Status post left hip replacement  DDD (degenerative disc disease), lumbar  Other chronic pain - Tramadol 50 mg, 2 tas po bid.  Vitamin D deficiency  History of migraine  History of depression  Interstitial cystitis  History of hypothyroidism  History of hypertension  History of hyperlipidemia    Orders: No orders of the defined types were placed in this encounter.  No orders of the defined types were placed in this encounter.   Face-to-face time spent with patient was *** minutes. 50% of time was spent in counseling and coordination of care.  Follow-Up Instructions: No follow-ups on file.   Ofilia Neas, PA-C  Note - This record has been created using Dragon software.  Chart creation errors have been sought, but may not always  have been located. Such creation errors do not reflect on  the standard of medical care.

## 2017-12-24 ENCOUNTER — Other Ambulatory Visit: Payer: Self-pay | Admitting: *Deleted

## 2017-12-24 DIAGNOSIS — M797 Fibromyalgia: Secondary | ICD-10-CM

## 2017-12-24 MED ORDER — ZOLPIDEM TARTRATE 10 MG PO TABS: 10.0000 mg | ORAL_TABLET | Freq: Every evening | ORAL | 2 refills | Status: DC | PRN

## 2017-12-24 NOTE — Telephone Encounter (Signed)
Refill Request received via fax  Last visit: 06/26/17 Next Visit: 12/27/17  Okay to refill Ambien?

## 2017-12-27 ENCOUNTER — Ambulatory Visit: Payer: BLUE CROSS/BLUE SHIELD | Admitting: Rheumatology

## 2017-12-27 ENCOUNTER — Ambulatory Visit: Payer: BLUE CROSS/BLUE SHIELD | Admitting: Physician Assistant

## 2017-12-27 ENCOUNTER — Telehealth: Payer: Self-pay | Admitting: Neurology

## 2017-12-27 NOTE — Telephone Encounter (Signed)
Called pt & LVM (ok per DPR). Informed pt that Dr. Jaynee Eagles also had never heard of this reaction or any association between Ajovy and shingles. Left office number asking for pt to call back if she has any further questions.

## 2017-12-27 NOTE — Telephone Encounter (Signed)
Pt thinks she is having a reaction to ajovy. She is due to get another injection tomorrow but is wanting to speak with Rn or provider today. Pt had to leave but will keep her phone with her. Please call to advise

## 2017-12-27 NOTE — Telephone Encounter (Signed)
Called pt and discussed Ajovy. She stated that her first injection was at her last office visit, 3/19. After her injection, she had shingles on her forehead. Her eye and eyebrow had swelling and the bumps extended into her hair line. She was unsure if this had any relation to the Ajovy. She said she had an a reaction to Botox but could not remember what it was. She has never had shingles. RN informed that the most common side effect is injection site reaction. However RN will inform Dr. Jaynee Eagles and then further advise pt. Her next dose of Ajovy is due tomorrow. She verbalized appreciation.

## 2017-12-27 NOTE — Telephone Encounter (Signed)
I have not heard of this reaction or any association between Ajovy and shingles

## 2017-12-28 ENCOUNTER — Other Ambulatory Visit: Payer: Self-pay | Admitting: Rheumatology

## 2017-12-28 DIAGNOSIS — F3289 Other specified depressive episodes: Secondary | ICD-10-CM

## 2017-12-31 NOTE — Telephone Encounter (Signed)
Last visit: 06/26/17 Next Visit: 01/15/18  Okay to refill per Dr. Estanislado Pandy

## 2018-01-01 NOTE — Progress Notes (Signed)
Office Visit Note  Patient: Molly Giles             Date of Birth: 1956/01/24           MRN: [id number]             PCP: McDiarmid, Blane Ohara, MD Referring: McDiarmid, Blane Ohara, MD Visit Date: 01/15/2018 Occupation: @GUAROCC @    Subjective:  Left hip pain   History of Present Illness: Molly Giles is a 62 y.o. female with history of fibromyalgia, osteoarthritis, and DDD.  Patient states that her fibromyalgia has been flaring more frequently.  She has about 2 flares a month.  Her fiber flares seem to correlate with her migraines.  She was recently started on Ajovy injections.  She has had 2 doses.  She continues to have chronic insomnia and fatigue.  Her fatigue has improved due to her limiting her activities.  She has been taking Ambien 1/2 tablet or 1 tablet at bedtime PRN.  She reports that she had a left total hip replacement in 2017 and has tried chronic pain since.  She states the pain is most severe when walking going up stairs.  She is having a left total hip revision on March 04, 2018.  She feels that her immune system has been suppressed.  She has had 4 URIs since December and recently had Shingles in March 2019.   Activities of Daily Living:  Patient reports morning stiffness for30  minutes.   Patient Reports nocturnal pain.  Difficulty dressing/grooming: Denies Difficulty climbing stairs: Reports Difficulty getting out of chair: Reports Difficulty using hands for taps, buttons, cutlery, and/or writing: Denies   Review of Systems  Constitutional: Negative for fatigue.  HENT: Positive for nose dryness. Negative for mouth sores and mouth dryness.   Eyes: Positive for dryness. Negative for pain and visual disturbance.  Respiratory: Negative for cough, hemoptysis, shortness of breath and difficulty breathing.   Cardiovascular: Negative for chest pain, palpitations, hypertension and swelling in legs/feet.  Gastrointestinal: Negative for blood in stool, constipation and diarrhea.    Endocrine: Negative for increased urination.  Genitourinary: Negative for painful urination.  Musculoskeletal: Positive for arthralgias, joint pain, morning stiffness and muscle tenderness. Negative for joint swelling, myalgias, muscle weakness and myalgias.  Skin: Negative for color change, pallor, rash, hair loss, nodules/bumps, skin tightness, ulcers and sensitivity to sunlight.  Allergic/Immunologic: Negative for susceptible to infections.  Neurological: Positive for headaches (HX of migraines). Negative for dizziness, numbness and weakness.  Hematological: Negative for swollen glands.  Psychiatric/Behavioral: Positive for sleep disturbance. Negative for depressed mood. The patient is not nervous/anxious.     PMFS History:  Patient Active Problem List   Diagnosis Date Noted  . Chronic migraine without aura, with intractable migraine, so stated, with status migrainosus 11/28/2017  . URI (upper respiratory infection) 08/14/2017  . Fatigue 07/05/2017  . Insulin resistance 07/04/2017  . Vitamin D deficiency 05/08/2017  . Class 1 obesity without serious comorbidity with body mass index (BMI) of 30.0 to 30.9 in adult 04/24/2017  . Vasomotor symptoms due to menopause 04/19/2017  . Periodic limb movement sleep disorder 03/28/2017  . Nocturnal hypoxemia 03/06/2017  . Morbid obesity ([place]) 03/06/2017  . Snoring 03/06/2017  . Gasping for breath 03/06/2017  . Sleep walking and eating 03/06/2017  . Episodic cluster headache, not intractable 03/06/2017  . Obesity (BMI 30.0-34.9) 01/29/2017  . Other insomnia 11/09/2016  . S/P left THA, AA 07/25/2016  . Irritable bowel syndrome with diarrhea 04/03/2016  .  Chronic migraine 02/25/2016  . Left ventricular hypertrophy, mild 02/25/2016  . History of irritable bowel syndrome 02/15/2016  . 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 Osteoarthritis of left hip 11/01/2015  . Hearing loss of both ears 07/27/2015  . Post concussive syndrome  07/14/2015  . Depression (NOS) 07/02/2015  . Allergic rhinoconjunctivitis 07/02/2015  . Fibromyalgia syndrome 07/01/2015  . History of Clostridium difficile colitis, persistent 07/01/2015  . Impairment of balance 02/10/2015  . Essential hypertension 05/15/2013  . Hypothyroidism 05/15/2013  . Hyperlipidemia 05/15/2013  . Interstitial cystitis 05/15/2013  . Chronic migraine without aura 05/15/2013  . Spondylosis of lumbar region without myelopathy or radiculopathy 10/30/2011    Past Medical History:  Diagnosis Date  . Allergic rhinoconjunctivitis 07/02/2015  . Arthritis   . Benign positional vertigo 04/2015   Responded well to [facility] Rehab  . Bruxism (teeth grinding)   . Chronic migraine 02/25/2016   Per Dr Jaynee Eagles review in notes from [facility] from September 2015. Showed total headache days last month 18. Severe headache days 7 days. Moderate headache days 5 days. Mild headache days last month sick days. Days without headache last month 10 days. Symptoms associated with photophobia, phonophobia, osmophobia, neck pain, dizziness, jaw pain, nasal congestion, vision disturbances, tingling and numbness, weakness and worsening with activity. Each headache attack last 3 hours depending on treatment in severity. Left side, the right side, easier side, the frontal area in the back of the head. Characterized as throbbing, pressure, tightness, squeezing, stabbing and burning   . Chronic migraine w/o aura w/o status migrainosus, not intractable 07/14/2015  . DDD (degenerative disc disease), lumbar   . Degenerative cervical disc   . Depression   . Disc displacement, lumbar   . Dysrhythmia    seen by dr Wynonia Lawman- not a problem since she has been on Bystolic  . Essential hypertension 05/15/2013  . Family history of adverse reaction to anesthesia    Brother- N/V  . Family history of premature CAD 05/15/2013  . Fibromyalgia   . GERD (gastroesophageal reflux disease) 12/16/2014  . H/O  seasonal allergies   . Hammer toe    Left great toe  . Hearing loss of both ears 07/27/2015   mild to borderline moderate low frequency hearing loss improving to within normal limits bilaterally on audiology testing at [facility] in November 2016.    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 History of irritable bowel syndrome 02/15/2016  . History of migraine headaches 05/15/2013   Per Dr Jaynee Eagles review in notes from [facility] from September 2015. Showed total headache days last month 18. Severe headache days 7 days. Moderate headache days 5 days. Mild headache days last month sick days. Days without headache last month 10 days. Symptoms associated with photophobia, phonophobia, osmophobia, neck pain, dizziness, jaw pain, nasal congestion, vision disturbances, tingling and numbness, weakness and worsening with activity. Each headache attack last 3 hours depending on treatment in severity. Left side, the right side, easier side, the frontal area in the back of the head. Characterized as throbbing, pressure, tightness, squeezing, stabbing and burning    . Hx of bad fall 02/2015   Severe Facial/head trauma without fracture  . Hyperlipidemia 1998  . Hypertension 2004  . Hypothyroidism   . Impairment of balance 02/2015   Consequent of postconcussive syndrome  . Interstitial cystitis   . Lumbar facet joint pain   . Meniere's disease of  right ear 12/03/2015  . Migraine 1962   Chronic Migraines  . Mood disorder ([place])   . Musculoskeletal neck pain 07/14/2015  . Normal coronary arteries 05/14/2014  . Osteoarthritis of left hip 11/01/2015   MRI order by Dr Alvan Dame (ortho) 10/2015 showed significant arthritis of left hip joint with cystic changes in femoral head c/w osteoarthritis  . 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 Overweight   . Pain in joint of left shoulder 11/09/2016  .  Palpitations 05/15/2013   [facility] manages  . Periodic limb movement sleep disorder 03/28/2017  . Perirectal cyst 05/07/2016  . PONV (postoperative nausea and vomiting)   . Post concussion syndrome 06/06/2015  . Post concussive syndrome 07/14/2015   Ms Ambrosino's post-concussive syndrome manifesting in vertigo and headache, mood changes, poor balance, dizziness, and decreased concentration per Dr Jaynee Eagles at [facility].   Marland Kitchen Posterior vitreous detachment of right eye 2014  . Shingles   . Shortness of breath dyspnea    with exertion  . Thyroid nodule 08/11/2009   Findings: The thyroid gland is within normal limits in size.  The gland is diffusely inhomogeneous. A small solid nodule is noted in the lower pole  medially on the right of 7 x 6 x 8 mm. A small solid nodule is noted inferiorly on the left of 3 x 3 x 4 mm.  IMPRESSION:  The thyroid gland is within normal limits in size with only small solid nodules present, the largest of only 8 mm in diameter on the right.    . 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   . Vasomotor symptoms due to menopause 04/19/2017    Family History  Problem Relation Age of Onset  . Alzheimer's disease Mother   . Hyperlipidemia Mother   . Hypertension Mother   . Osteoporosis Mother   . Parkinson's disease Mother   . Migraines Sister   . Allergies Sister   . Hypertension Sister   . Hyperlipidemia Brother   . Cardiomyopathy Brother   . Diabetes type II Brother   . Kidney disease Brother   . Asthma Brother   . Heart disease Brother   . Hypertension Brother   . Hyperlipidemia Brother   . Kidney disease Brother   . Heart disease Father   . Hyperlipidemia Father   . Hypertension Father   . Aortic aneurysm Father   . Early death Father 46  . Diabetes type II Unknown   . Breast cancer Maternal Aunt    Past Surgical History:  Procedure Laterality Date  . Bladder dilitation     x 3  .  BREAST BIOPSY  2011   Benign histology  . CARDIOVASCULAR STRESS TEST  2000   Unremarkable per pt report  . CARPOMETACARPAL JOINT ARTHROTOMY Right 2011  . COLONOSCOPY    . COLONOSCOPY WITH PROPOFOL N/A 04/21/2015   Procedure: COLONOSCOPY WITH PROPOFOL;  Surgeon: Carol Ada, MD;  Location: [facility];  Service: Endoscopy;  Laterality: N/A;  . EPIDURAL BLOCK INJECTION Left 04/12/2016   Left Medial Nerve Block and Left L5 ramus block, Dr Suella Broad   . EPIDURAL BLOCK INJECTION  03/21/2016   Left L3-4 medial branch block and Left L5 & dorsal ramus block   . EPIDURAL BLOCK INJECTION N/A 10/25/2016   Suella Broad, MD. Lumbar medial branch block  . EPIDURAL BLOCK INJECTION N/A 02/09/2017   Suella Broad, MD.  Bilateral L3/4 medial branch block, bilateral L5 dorsal ramus block  . EPIDURAL BLOCK INJECTION N/A 07/04/2017   Suella Broad, MD  . FECAL TRANSPLANT  04/21/2015   Procedure: FECAL TRANSPLANT;  Surgeon: Carol Ada, MD;  Location: [facility];  Service: Endoscopy;;  . HIP ARTHROPLASTY Left   . INJECTION HIP INTRA ARTICULAR Left 11/2015   for OA by Dr Suella Broad  . OTHER SURGICAL HISTORY Left 2016   Left L3/L4 medial nerve block and Left L5 Dorsal Ramus block Dr Mickel Duhamel  . TOTAL HIP ARTHROPLASTY Left 07/25/2016   Procedure: LEFT TOTAL HIP ARTHROPLASTY ANTERIOR APPROACH;  Surgeon: Paralee Cancel, MD;  Location: [facility];  Service: Orthopedics;  Laterality: Left;   Social History   Social History Narrative   Prior PCP With [facility] at [place], [place].   Married, lives with Shirley, (b. 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    Wears seatbelt usually   No religious beliefs affecting healthcare   No difficulty taking medications as directed.       Home has working smoke alarm   No home throw rugs   Does not have nonslip bathtub / shower    Has railings on all stairs   Home is free from  [place].    Right-handed.      No history of Hospitalization as of 07/2016.       Best number to reach patient [phone] (M) as of 07/01/15   It is permissible to leave messages as of 07/01/15 .    No regular exercise of 3 times a week for 30 minutes at a time      Terryl Niziolek has Advanced Directive and a Doctor, general practice is husband, Anadia Helmes [phone]      Caffeine: 3 cups coffee per day, sometimes 2-3 cups of tea     Objective: Vital Signs: BP 129/88 (BP Location: Left Arm, Patient Position: Sitting, Cuff Size: Large)   Pulse 73   Resp 12   Ht 5' 6.5" (1.689 m)   Wt 184 lb (83.5 kg)   BMI 29.25 kg/m    Physical Exam  Constitutional: She is oriented to person, place, and time. She appears well-developed and well-nourished.  HENT:  Head: Normocephalic and atraumatic.  Eyes: Conjunctivae and EOM are normal.  Neck: Normal range of motion.  Cardiovascular: Normal rate, regular rhythm, normal heart sounds and intact distal pulses.  Pulmonary/Chest: Effort normal and breath sounds normal.  Abdominal: Soft. Bowel sounds are normal.  Lymphadenopathy:    She has no cervical adenopathy.  Neurological: She is alert and oriented to person, place, and time.  Skin: Skin is warm and dry. Capillary refill takes less than 2 seconds.  Psychiatric: She has a normal mood and affect. Her behavior is normal.  Nursing note and vitals reviewed.    Musculoskeletal Exam:C-spine, thoracic spine, lumbar spine good ROM.  No midline spinal tenderness.  No SI joint tenderness.  Shoulder joints, elbow joints, wrist joints, MCPs, PIPs, and DIPs good ROM. PIP and DIP synovial thickening consistent with osteoarthritis. Left hip limited ROM with discomfort.  Right hip good range of motion. Right trochanteric bursitis.  Knee joints, ankle joints, MTPs, PIPs, DIPs good range of motion with no synovitis.  No warmth or effusion of bilateral knees.  CDAI Exam: No CDAI exam completed.     Investigation: No additional findings. CBC Latest Ref  Rng & Units 03/22/2017 07/26/2016 07/17/2016  WBC 3.4 - 10.8 x10E3/uL 6.6 15.6(H) 8.2  Hemoglobin 11.1 - 15.9 g/dL 12.9 10.9(L) 13.0  Hematocrit 34.0 - 46.6 % 39.6 33.6(L) 40.6  Platelets 150 - 400 K/uL - 236 294   CMP Latest Ref Rng & Units 07/04/2017 03/22/2017 01/25/2017  Glucose 65 - 99 mg/dL 83 76 73  BUN 8 - 27 mg/dL 13 7(L) 18  Creatinine 0.57 - 1.00 mg/dL 0.75 0.79 0.89  Sodium 134 - 144 mmol/L 142 142 142  Potassium 3.5 - 5.2 mmol/L 4.3 4.8 4.3  Chloride 96 - 106 mmol/L 107(H) 103 107(H)  CO2 20 - 29 mmol/L 25 25 22   Calcium 8.7 - 10.3 mg/dL 9.2 9.4 9.9  Total Protein 6.0 - 8.5 g/dL 6.4 6.5 -  Total Bilirubin 0.0 - 1.2 mg/dL 0.3 0.3 -  Alkaline Phos 39 - 117 IU/L 86 90 -  AST 0 - 40 IU/L 16 33 -  ALT 0 - 32 IU/L 17 46(H) -   Imaging: No results found.  Speciality Comments: No specialty comments available.    Procedures:  No procedures performed Allergies: Sulfa antibiotics; Codeine; Lyrica [pregabalin]; Methylprednisolone; Prednisone; Betadine [povidone iodine]; Hibiclens [chlorhexidine gluconate]; Latex; and Wellbutrin [bupropion]   Assessment / Plan:     Visit Diagnoses: Fibromyalgia -She continues to have several tender trigger points.  She has generalized muscle tenderness and muscle aches.  She has been having about 2 fibromyalgia flares a month, which correlate with her migraines.  She was recently started on Ajovy.  She continues to take Ambien at bedtime for insomnia.  Encouraged to continue to exercise and stretch on a regular basis.  Plan: zolpidem (AMBIEN) 10 MG tablet  Other fatigue: Chronic but improving.  She was encouraged to continue to exercise on a regular basis.  Other insomnia: She takes Ambien 1/2 tab or 1 tab at bedtime for insomnia.  A refill of Ambien was sent to the pharmacy.  Primary osteoarthritis of both hands: She has PIP and DIP synovial thickening consistent with osteoarthritis.   Joint protection muscle strengthening were discussed.  Trochanteric bursitis of right hip: She has tenderness the right trochanteric bursa.  We will not perform a cortisone injection today due to her upcoming surgery.  She can return after she is cleared by her surgeon if she would like a cortisone injection.  Her bursitis will likely improve following her surgery.   Status post left hip replacement: Chronic pain.  She is having a left total knee arthroplasty revision on March 04, 2018.  Her left hip with discomfort.  DDD (degenerative disc disease), lumbar: She has no midline spinal tenderness.  She has some discomfort in her lower back due to how she is favoring her left hip.   Chronic pain syndrome -  Tramadol 50 mg, 2 tablets by mouth BID.  She has not had a refill of tramadol in about 1 year.   History of migraine: She was recently started on Ajovy injections.  She has had 2 injections.  She has been having 2+ migraines a month.   Other medical conditions are listed as follows:   Vitamin D deficiency  History of depression  IC (interstitial cystitis)  History of hypothyroidism  History of hypertension  History of hyperlipidemia  Other depression -28-month supply of Celexa was sent to the pharmacy.  Plan: citalopram (CELEXA) 20 MG tablet    Orders: No orders of the defined types were placed in this encounter.  Meds ordered this  encounter  Medications  . zolpidem (AMBIEN) 10 MG tablet    Sig: Take 1 tablet (10 mg total) by mouth at bedtime as needed.    Dispense:  30 tablet    Refill:  2  . citalopram (CELEXA) 20 MG tablet    Sig: TAKE 1 TABLET(20 MG) BY MOUTH DAILY    Dispense:  90 tablet    Refill:  0    Face-to-face time spent with patient was 30 minutes. >50% of time was spent in counseling and coordination of care.  Follow-Up Instructions: Return in about 6 months (around 07/18/2018) for Fibromyalgia, Osteoarthritis.   Ofilia Neas, PA-C  Note - This record  has been created using Dragon software.  Chart creation errors have been sought, but may not always  have been located. Such creation errors do not reflect on  the standard of medical care.

## 2018-01-15 ENCOUNTER — Ambulatory Visit: Payer: BLUE CROSS/BLUE SHIELD | Admitting: Physician Assistant

## 2018-01-15 ENCOUNTER — Encounter: Payer: Self-pay | Admitting: Physician Assistant

## 2018-01-15 VITALS — BP 129/88 | HR 73 | Resp 12 | Ht 66.5 in | Wt 184.0 lb

## 2018-01-15 DIAGNOSIS — M5136 Other intervertebral disc degeneration, lumbar region: Secondary | ICD-10-CM | POA: Diagnosis not present

## 2018-01-15 DIAGNOSIS — R5383 Other fatigue: Secondary | ICD-10-CM | POA: Diagnosis not present

## 2018-01-15 DIAGNOSIS — Z8639 Personal history of other endocrine, nutritional and metabolic disease: Secondary | ICD-10-CM

## 2018-01-15 DIAGNOSIS — M797 Fibromyalgia: Secondary | ICD-10-CM

## 2018-01-15 DIAGNOSIS — Z8679 Personal history of other diseases of the circulatory system: Secondary | ICD-10-CM

## 2018-01-15 DIAGNOSIS — Z8659 Personal history of other mental and behavioral disorders: Secondary | ICD-10-CM

## 2018-01-15 DIAGNOSIS — M19042 Primary osteoarthritis, left hand: Secondary | ICD-10-CM

## 2018-01-15 DIAGNOSIS — Z8669 Personal history of other diseases of the nervous system and sense organs: Secondary | ICD-10-CM | POA: Diagnosis not present

## 2018-01-15 DIAGNOSIS — N3010 Interstitial cystitis (chronic) without hematuria: Secondary | ICD-10-CM | POA: Diagnosis not present

## 2018-01-15 DIAGNOSIS — Z96642 Presence of left artificial hip joint: Secondary | ICD-10-CM

## 2018-01-15 DIAGNOSIS — M7062 Trochanteric bursitis, left hip: Secondary | ICD-10-CM | POA: Diagnosis not present

## 2018-01-15 DIAGNOSIS — E559 Vitamin D deficiency, unspecified: Secondary | ICD-10-CM | POA: Diagnosis not present

## 2018-01-15 DIAGNOSIS — M19041 Primary osteoarthritis, right hand: Secondary | ICD-10-CM | POA: Diagnosis not present

## 2018-01-15 DIAGNOSIS — G894 Chronic pain syndrome: Secondary | ICD-10-CM | POA: Diagnosis not present

## 2018-01-15 DIAGNOSIS — F3289 Other specified depressive episodes: Secondary | ICD-10-CM

## 2018-01-15 DIAGNOSIS — G4709 Other insomnia: Secondary | ICD-10-CM

## 2018-01-15 MED ORDER — ZOLPIDEM TARTRATE 10 MG PO TABS: 10.0000 mg | ORAL_TABLET | Freq: Every evening | ORAL | 2 refills | Status: DC | PRN

## 2018-01-15 MED ORDER — CITALOPRAM HYDROBROMIDE 20 MG PO TABS: ORAL_TABLET | ORAL | 0 refills | Status: DC

## 2018-01-15 NOTE — Patient Instructions (Signed)
Natural anti-inflammatories  You can purchase these at [employer], [employer] or online.  . Turmeric (capsules)  . Ginger (ginger root or capsules)  . Omega 3 (Fish, flax seeds, chia seeds, walnuts, almonds)  . Tart cherry (dried or extract)   Patient should be under the care of a physician while taking these supplements. This may not be reproduced without the permission of Dr. Shaili Deveshwar.  

## 2018-02-07 LAB — CBC AND DIFFERENTIAL
HEMATOCRIT: 30 — AB (ref 36–46)
HEMOGLOBIN: 10.5 — AB (ref 12.0–16.0)
Platelets: 153 (ref 150–399)
WBC: 10.2

## 2018-02-07 LAB — HEMOGLOBIN A1C: Hgb A1c MFr Bld: 5 (ref 4.0–6.0)

## 2018-02-07 LAB — POCT ERYTHROCYTE SEDIMENTATION RATE, NON-AUTOMATED: SED RATE: 12

## 2018-02-11 ENCOUNTER — Other Ambulatory Visit: Payer: Self-pay | Admitting: Family Medicine

## 2018-03-06 ENCOUNTER — Encounter: Payer: Self-pay | Admitting: Family Medicine

## 2018-03-06 HISTORY — PX: HIP ARTHROSCOPY: SUR88

## 2018-03-06 MED ORDER — BISACODYL 10 MG RE SUPP
10.00 | RECTAL | Status: DC
Start: ? — End: 2018-03-06

## 2018-03-06 MED ORDER — ASPIRIN EC 325 MG PO TBEC
325.00 | DELAYED_RELEASE_TABLET | ORAL | Status: DC
Start: 2018-03-06 — End: 2018-03-06

## 2018-03-06 MED ORDER — ALUMINUM-MAGNESIUM-SIMETHICONE 200-200-20 MG/5ML PO SUSP
30.00 | ORAL | Status: DC
Start: ? — End: 2018-03-06

## 2018-03-06 MED ORDER — ONDANSETRON HCL 4 MG/2ML IJ SOLN
4.00 | INTRAMUSCULAR | Status: DC
Start: ? — End: 2018-03-06

## 2018-03-06 MED ORDER — SODIUM CHLORIDE 0.9 % IJ SOLN
10.00 | INTRAMUSCULAR | Status: DC
Start: ? — End: 2018-03-06

## 2018-03-06 MED ORDER — POLYSACCHARIDE IRON COMPLEX 150 MG PO CAPS
150.00 | ORAL_CAPSULE | ORAL | Status: DC
Start: 2018-03-07 — End: 2018-03-06

## 2018-03-06 MED ORDER — LEVOTHYROXINE SODIUM 125 MCG PO TABS
125.00 | ORAL_TABLET | ORAL | Status: DC
Start: 2018-03-07 — End: 2018-03-06

## 2018-03-06 MED ORDER — LACTATED RINGERS IV SOLN
INTRAVENOUS | Status: DC
Start: ? — End: 2018-03-06

## 2018-03-06 MED ORDER — OXYCODONE HCL 5 MG PO TABS
5.00 | ORAL_TABLET | ORAL | Status: DC
Start: ? — End: 2018-03-06

## 2018-03-06 MED ORDER — GABAPENTIN 100 MG PO CAPS
100.00 | ORAL_CAPSULE | ORAL | Status: DC
Start: 2018-03-06 — End: 2018-03-06

## 2018-03-06 MED ORDER — GENERIC EXTERNAL MEDICATION
1.00 | Status: DC
Start: ? — End: 2018-03-06

## 2018-03-06 MED ORDER — GENERIC EXTERNAL MEDICATION
12.50 | Status: DC
Start: ? — End: 2018-03-06

## 2018-03-06 MED ORDER — SODIUM CHLORIDE 0.9 % IJ SOLN
10.00 | INTRAMUSCULAR | Status: DC
Start: 2018-03-06 — End: 2018-03-06

## 2018-03-06 MED ORDER — ZOLPIDEM TARTRATE 5 MG PO TABS
5.00 | ORAL_TABLET | ORAL | Status: DC
Start: 2018-03-06 — End: 2018-03-06

## 2018-03-06 MED ORDER — SENNOSIDES-DOCUSATE SODIUM 8.6-50 MG PO TABS
2.00 | ORAL_TABLET | ORAL | Status: DC
Start: 2018-03-06 — End: 2018-03-06

## 2018-03-06 MED ORDER — ACETAMINOPHEN 500 MG PO TABS
1000.00 | ORAL_TABLET | ORAL | Status: DC
Start: 2018-03-06 — End: 2018-03-06

## 2018-03-06 MED ORDER — ROSUVASTATIN CALCIUM 5 MG PO TABS
10.00 | ORAL_TABLET | ORAL | Status: DC
Start: 2018-03-06 — End: 2018-03-06

## 2018-03-06 MED ORDER — PREDNISOLONE ACETATE 1 % OP SUSP
1.00 | OPHTHALMIC | Status: DC
Start: 2018-03-07 — End: 2018-03-06

## 2018-03-06 MED ORDER — SUMATRIPTAN SUCCINATE 50 MG PO TABS
50.00 | ORAL_TABLET | ORAL | Status: DC
Start: ? — End: 2018-03-06

## 2018-03-06 MED ORDER — OXYCODONE HCL 5 MG PO TABS
10.00 | ORAL_TABLET | ORAL | Status: DC
Start: ? — End: 2018-03-06

## 2018-03-06 MED ORDER — CITALOPRAM HYDROBROMIDE 20 MG PO TABS
20.00 | ORAL_TABLET | ORAL | Status: DC
Start: 2018-03-07 — End: 2018-03-06

## 2018-03-09 ENCOUNTER — Encounter (HOSPITAL_COMMUNITY): Payer: Self-pay | Admitting: *Deleted

## 2018-03-09 ENCOUNTER — Emergency Department (HOSPITAL_COMMUNITY): Payer: BLUE CROSS/BLUE SHIELD

## 2018-03-09 ENCOUNTER — Emergency Department (HOSPITAL_COMMUNITY)
Admission: EM | Admit: 2018-03-09 | Discharge: 2018-03-09 | Disposition: A | Discharge status: Home / Self Care | Payer: BLUE CROSS/BLUE SHIELD | Attending: Emergency Medicine | Admitting: Emergency Medicine

## 2018-03-09 ENCOUNTER — Other Ambulatory Visit: Payer: Self-pay

## 2018-03-09 ENCOUNTER — Emergency Department (HOSPITAL_BASED_OUTPATIENT_CLINIC_OR_DEPARTMENT_OTHER): Payer: BLUE CROSS/BLUE SHIELD

## 2018-03-09 DIAGNOSIS — Z91040 Latex allergy status: Secondary | ICD-10-CM | POA: Insufficient documentation

## 2018-03-09 DIAGNOSIS — M7989 Other specified soft tissue disorders: Secondary | ICD-10-CM

## 2018-03-09 DIAGNOSIS — M79609 Pain in unspecified limb: Secondary | ICD-10-CM | POA: Diagnosis not present

## 2018-03-09 DIAGNOSIS — M79652 Pain in left thigh: Secondary | ICD-10-CM | POA: Diagnosis not present

## 2018-03-09 DIAGNOSIS — I10 Essential (primary) hypertension: Secondary | ICD-10-CM | POA: Insufficient documentation

## 2018-03-09 DIAGNOSIS — Z79899 Other long term (current) drug therapy: Secondary | ICD-10-CM | POA: Diagnosis not present

## 2018-03-09 DIAGNOSIS — E039 Hypothyroidism, unspecified: Secondary | ICD-10-CM | POA: Insufficient documentation

## 2018-03-09 DIAGNOSIS — Z96642 Presence of left artificial hip joint: Secondary | ICD-10-CM | POA: Diagnosis not present

## 2018-03-09 DIAGNOSIS — Z7722 Contact with and (suspected) exposure to environmental tobacco smoke (acute) (chronic): Secondary | ICD-10-CM | POA: Insufficient documentation

## 2018-03-09 LAB — CBC WITH DIFFERENTIAL/PLATELET
BASOS ABS: 0 10*3/uL (ref 0.0–0.1)
Basophils Relative: 0 %
Eosinophils Absolute: 0.5 10*3/uL (ref 0.0–0.7)
Eosinophils Relative: 7 %
HCT: 32.4 % — ABNORMAL LOW (ref 36.0–46.0)
HEMOGLOBIN: 10.4 g/dL — AB (ref 12.0–15.0)
LYMPHS PCT: 24 %
Lymphs Abs: 1.6 10*3/uL (ref 0.7–4.0)
MCH: 30.3 pg (ref 26.0–34.0)
MCHC: 32.1 g/dL (ref 30.0–36.0)
MCV: 94.5 fL (ref 78.0–100.0)
MONO ABS: 0.7 10*3/uL (ref 0.1–1.0)
MONOS PCT: 10 %
NEUTROS ABS: 4 10*3/uL (ref 1.7–7.7)
NEUTROS PCT: 59 %
Platelets: 271 10*3/uL (ref 150–400)
RBC: 3.43 MIL/uL — ABNORMAL LOW (ref 3.87–5.11)
RDW: 14.7 % (ref 11.5–15.5)
WBC: 6.8 10*3/uL (ref 4.0–10.5)

## 2018-03-09 LAB — BASIC METABOLIC PANEL
ANION GAP: 8 (ref 5–15)
BUN: 12 mg/dL (ref 8–23)
CO2: 27 mmol/L (ref 22–32)
Calcium: 8.6 mg/dL — ABNORMAL LOW (ref 8.9–10.3)
Chloride: 104 mmol/L (ref 98–111)
Creatinine, Ser: 0.62 mg/dL (ref 0.44–1.00)
GFR calc non Af Amer: >60 mL/min (ref >60)
GLUCOSE: 86 mg/dL (ref 70–99)
POTASSIUM: 3.8 mmol/L (ref 3.5–5.1)
Sodium: 139 mmol/L (ref 135–145)

## 2018-03-09 NOTE — Discharge Instructions (Signed)
Your evaluated in the emergency department for increase of left thigh pain along with some swelling and some redness of the skin.  You had blood work x-ray and a vascular study that did not show an obvious cause of your symptoms.  We reviewed these findings with Dr. Neldon Mc at [facility] and his recommendation was that you be discharged and follow-up with Dr. Jefferson Fuel on Monday.  Please watch out for any fever, increased redness, drainage from the wound or other concerns and call the on-call orthopedist for Dr. Jefferson Fuel.  Please return to the emergency department if needed.

## 2018-03-09 NOTE — Progress Notes (Signed)
*  Preliminary Results* Left lower extremity venous duplex completed. Left lower extremity is negative for deep vein thrombosis. There is no evidence of left Baker's cyst.  03/09/2018 2:14 PM  Maudry Mayhew, BS, RVT, RDCS, RDMS

## 2018-03-09 NOTE — ED Triage Notes (Signed)
Pt had left hip surgery last Monday, had developed redness, swelling, pain, as well as hot to touch

## 2018-03-09 NOTE — ED Provider Notes (Signed)
[facility] DEPT Provider Note   CSN: [id number] Arrival date & time: 03/09/18  1042     History   Chief Complaint Chief Complaint  Patient presents with  . Hip Pain    HPI Molly Giles is a 62 y.o. female.  She had a prior hip replacement that was loosening and so she had a redo left hip replacement by Dr. Jefferson Fuel at [facility] about a week ago.  She was discharged from the hospital on Wednesday and was doing well taking oral oxycodone.  Starting yesterday she noticed some increased pain in her thigh along with redness extending from her incision down to her knee and also in her medial leg.  She attempted to reach her surgeon yesterday but did not get a call back.  Today when she talked to the office they said she should go to the emergency department for an evaluation.  There is been no fever no chest pain no shortness of breath.  Mild nausea but she is not sure if it is the pain, pain medicine, or an illness.  She still has a dressing over the wound so it is not clear whether there is been any wound drainage.  The history is provided by the patient.  Hip Pain  This is a new problem. The current episode started yesterday. The problem occurs constantly. The problem has been gradually worsening. Pertinent negatives include no chest pain, no abdominal pain, no headaches and no shortness of breath. Nothing aggravates the symptoms. Nothing relieves the symptoms. She has tried rest for the symptoms. The treatment provided no relief.    Past Medical History:  Diagnosis Date  . Allergic rhinoconjunctivitis 07/02/2015  . Arthritis   . Benign positional vertigo 04/2015   Responded well to [facility]  . Bruxism (teeth grinding)   . Chronic migraine 02/25/2016   Per Dr Jaynee Eagles review in notes from [facility] from September 2015. Showed total headache days last month 18. Severe headache days 7 days. Moderate headache days 5 days. Mild headache days  last month sick days. Days without headache last month 10 days. Symptoms associated with photophobia, phonophobia, osmophobia, neck pain, dizziness, jaw pain, nasal congestion, vision disturbances, tingling and numbness, weakness and worsening with activity. Each headache attack last 3 hours depending on treatment in severity. Left side, the right side, easier side, the frontal area in the back of the head. Characterized as throbbing, pressure, tightness, squeezing, stabbing and burning   . Chronic migraine w/o aura w/o status migrainosus, not intractable 07/14/2015  . DDD (degenerative disc disease), lumbar   . Degenerative cervical disc   . Depression   . Disc displacement, lumbar   . Dysrhythmia    seen by dr Wynonia Lawman- not a problem since she has been on Bystolic  . Essential hypertension 05/15/2013  . Family history of adverse reaction to anesthesia    Brother- N/V  . Family history of premature CAD 05/15/2013  . Fibromyalgia   . GERD (gastroesophageal reflux disease) 12/16/2014  . H/O seasonal allergies   . Hammer toe    Left great toe  . Hearing loss of both ears 07/27/2015   mild to borderline moderate low frequency hearing loss improving to within normal limits bilaterally on audiology testing at [facility] in November 2016.    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 History of irritable bowel syndrome 02/15/2016  . History of migraine headaches 05/15/2013  Per Dr Jaynee Eagles review in notes from [facility] from September 2015. Showed total headache days last month 18. Severe headache days 7 days. Moderate headache days 5 days. Mild headache days last month sick days. Days without headache last month 10 days. Symptoms associated with photophobia, phonophobia, osmophobia, neck pain, dizziness, jaw pain, nasal congestion, vision disturbances, tingling and numbness, weakness and worsening with activity. Each headache  attack last 3 hours depending on treatment in severity. Left side, the right side, easier side, the frontal area in the back of the head. Characterized as throbbing, pressure, tightness, squeezing, stabbing and burning    . Hx of bad fall 02/2015   Severe Facial/head trauma without fracture  . Hyperlipidemia 1998  . Hypertension 2004  . Hypothyroidism   . Impairment of balance 02/2015   Consequent of postconcussive syndrome  . Interstitial cystitis   . Lumbar facet joint pain   . 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 Migraine 1962   Chronic Migraines  . Mood disorder ([place])   . Musculoskeletal neck pain 07/14/2015  . Normal coronary arteries 05/14/2014  . Osteoarthritis of left hip 11/01/2015   MRI order by Dr Alvan Dame (ortho) 10/2015 showed significant arthritis of left hip joint with cystic changes in femoral head c/w osteoarthritis  . 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 Overweight   . Pain in joint of left shoulder 11/09/2016  . Palpitations 05/15/2013   [facility] manages  . Periodic limb movement sleep disorder 03/28/2017  . Perirectal cyst 05/07/2016  . PONV (postoperative nausea and vomiting)   . Post concussion syndrome 06/06/2015  . Post concussive syndrome 07/14/2015   Ms Chianese's post-concussive syndrome manifesting in vertigo and headache, mood changes, poor balance, dizziness, and decreased concentration per Dr Jaynee Eagles at [facility].   Marland Kitchen Posterior vitreous detachment of right eye 2014  . Shingles   . Shortness of breath dyspnea    with exertion  . Thyroid nodule 08/11/2009   Findings: The thyroid gland is within normal limits in size.  The gland is diffusely inhomogeneous. A small solid nodule is noted in the lower pole  medially on the right of 7 x 6 x 8 mm. A small solid nodule is noted inferiorly on the left of 3 x 3 x 4 mm.  IMPRESSION:  The thyroid gland is within normal limits in size with only small solid  nodules present, the largest of only 8 mm in diameter on the right.    . 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   . Vasomotor symptoms due to menopause 04/19/2017    Patient Active Problem List   Diagnosis Date Noted  . Chronic migraine without aura, with intractable migraine, so stated, with status migrainosus 11/28/2017  . URI (upper respiratory infection) 08/14/2017  . Fatigue 07/05/2017  . Insulin resistance 07/04/2017  . Vitamin D deficiency 05/08/2017  . Class 1 obesity without serious comorbidity with body mass index (BMI) of 30.0 to 30.9 in adult 04/24/2017  . Vasomotor symptoms due to menopause 04/19/2017  . Periodic limb movement sleep disorder 03/28/2017  . Nocturnal hypoxemia 03/06/2017  . Morbid obesity ([place]) 03/06/2017  . Snoring 03/06/2017  . Gasping for breath 03/06/2017  . Sleep walking and eating 03/06/2017  . Episodic cluster headache, not intractable 03/06/2017  . Obesity (BMI 30.0-34.9) 01/29/2017  . Other insomnia 11/09/2016  . S/P left THA, AA 07/25/2016  .  Irritable bowel syndrome with diarrhea 04/03/2016  . Chronic migraine 02/25/2016  . Left ventricular hypertrophy, mild 02/25/2016  . History of irritable bowel syndrome 02/15/2016  . 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 Osteoarthritis of left hip 11/01/2015  . Hearing loss of both ears 07/27/2015  . Post concussive syndrome 07/14/2015  . Depression (NOS) 07/02/2015  . Allergic rhinoconjunctivitis 07/02/2015  . Fibromyalgia syndrome 07/01/2015  . History of Clostridium difficile colitis, persistent 07/01/2015  . Impairment of balance 02/10/2015  . Essential hypertension 05/15/2013  . Hypothyroidism 05/15/2013  . Hyperlipidemia 05/15/2013  . Interstitial cystitis 05/15/2013  . Chronic migraine without aura 05/15/2013  . Spondylosis of lumbar region without myelopathy or radiculopathy 10/30/2011    Past Surgical History:    Procedure Laterality Date  . Bladder dilitation     x 3  . BREAST BIOPSY  2011   Benign histology  . CARDIOVASCULAR STRESS TEST  2000   Unremarkable per pt report  . CARPOMETACARPAL JOINT ARTHROTOMY Right 2011  . COLONOSCOPY    . COLONOSCOPY WITH PROPOFOL N/A 04/21/2015   Procedure: COLONOSCOPY WITH PROPOFOL;  Surgeon: Carol Ada, MD;  Location: [facility];  Service: Endoscopy;  Laterality: N/A;  . EPIDURAL BLOCK INJECTION Left 04/12/2016   Left Medial Nerve Block and Left L5 ramus block, Dr Suella Broad   . EPIDURAL BLOCK INJECTION  03/21/2016   Left L3-4 medial branch block and Left L5 & dorsal ramus block   . EPIDURAL BLOCK INJECTION N/A 10/25/2016   Suella Broad, MD. Lumbar medial branch block  . EPIDURAL BLOCK INJECTION N/A 02/09/2017   Suella Broad, MD.  Bilateral L3/4 medial branch block, bilateral L5 dorsal ramus block  . EPIDURAL BLOCK INJECTION N/A 07/04/2017   Suella Broad, MD  . FECAL TRANSPLANT  04/21/2015   Procedure: FECAL TRANSPLANT;  Surgeon: Carol Ada, MD;  Location: [facility];  Service: Endoscopy;;  . HIP ARTHROPLASTY Left   . INJECTION HIP INTRA ARTICULAR Left 11/2015   for OA by Dr Suella Broad  . OTHER SURGICAL HISTORY Left 2016   Left L3/L4 medial nerve block and Left L5 Dorsal Ramus block Dr Mickel Duhamel  . TOTAL HIP ARTHROPLASTY Left 07/25/2016   Procedure: LEFT TOTAL HIP ARTHROPLASTY ANTERIOR APPROACH;  Surgeon: Paralee Cancel, MD;  Location: [facility];  Service: Orthopedics;  Laterality: Left;     OB History    Gravida  1   Para  1   Term  1   Preterm      AB      Living        SAB      TAB      Ectopic      Multiple      Live Births               Home Medications    Prior to Admission medications   Medication Sig Start Date End Date Taking? Authorizing Provider  amLODipine (NORVASC) 2.5 MG tablet TAKE 1 TABLET DAILY (NEEDS APPOINTMENT FOR FUTURE     REFILLS OR 90-DAY SUPPLY) 02/11/18   McDiarmid, Blane Ohara, MD  citalopram  (CELEXA) 20 MG tablet TAKE 1 TABLET(20 MG) BY MOUTH DAILY 01/15/18   Ofilia Neas, PA-C  diclofenac sodium (VOLTAREN) 1 % GEL Apply 3 gm to 3 large joints up to 3 times a day.Dispense 3 tubes with 3 refills. 06/26/17   Bo Merino, MD  DUEXIS 800-26.6 MG TABS TAKE ONE TABLET BY MOUTH TWO TO THREE TIMES  A DAY 12/10/17   [provider]  eletriptan (RELPAX) 40 MG tablet Take 1 tablet (40 mg total) by mouth as needed for migraine. may repeat in 2 hours if necessary 07/02/15   McDiarmid, Blane Ohara, MD  Fremanezumab-vfrm (AJOVY) 225 MG/1.5ML SOSY Inject 225 mg into the skin every 30 (thirty) days. 11/27/17   Melvenia Beam, MD  ibuprofen (ADVIL,MOTRIN) 200 MG tablet Take 800 mg by mouth.     [provider]  levothyroxine (SYNTHROID, LEVOTHROID) 125 MCG tablet TAKE 1/2 TABLET BY MOUTH DAILY 04/09/17   McDiarmid, Blane Ohara, MD  loratadine (CLARITIN) 10 MG tablet Take 10 mg by mouth daily.    [provider]  prednisoLONE acetate (PRED FORTE) 1 % ophthalmic suspension Place 1 drop into both eyes daily.    [provider]  PREMPRO 0.3-1.5 MG tablet TAKE 1 TABLET BY MOUTH AT BEDTIME 09/05/17   McDiarmid, Blane Ohara, MD  Probiotic Product ([place]) Take by mouth.    [provider]  rosuvastatin (CRESTOR) 10 MG tablet Take 1 tablet (10 mg total) by mouth daily. 12/18/16   McDiarmid, Blane Ohara, MD  traMADol (ULTRAM) 50 MG tablet Take 2 tablets (100 mg total) by mouth 2 (two) times daily. 01/18/17   Panwala, Naitik, PA-C  zolpidem (AMBIEN) 10 MG tablet Take 1 tablet (10 mg total) by mouth at bedtime as needed. 01/15/18   Ofilia Neas, PA-C    Family History Family History  Problem Relation Age of Onset  . Alzheimer's disease Mother   . Hyperlipidemia Mother   . Hypertension Mother   . Osteoporosis Mother   . Parkinson's disease Mother   . Migraines Sister   . Allergies Sister   . Hypertension Sister   . Hyperlipidemia Brother   . Cardiomyopathy  Brother   . Diabetes type II Brother   . Kidney disease Brother   . Asthma Brother   . Heart disease Brother   . Hypertension Brother   . Hyperlipidemia Brother   . Kidney disease Brother   . Heart disease Father   . Hyperlipidemia Father   . Hypertension Father   . Aortic aneurysm Father   . Early death Father 72  . Diabetes type II Unknown   . Breast cancer Maternal Aunt     Social History Social History   Tobacco Use  . Smoking status: Passive Smoke Exposure - Never Smoker  . Smokeless tobacco: Never Used  . Tobacco comment: As a child  Substance Use Topics  . Alcohol use: No    Alcohol/week: 0.0 oz    Comment: No use in 30 years.  . Drug use: No     Allergies   Sulfa antibiotics; Codeine; Lyrica [pregabalin]; Methylprednisolone; Prednisone; Betadine [povidone iodine]; Hibiclens [chlorhexidine gluconate]; Latex; and Wellbutrin [bupropion]   Review of Systems Review of Systems  Constitutional: Negative for chills and fever.  HENT: Negative for sore throat.   Eyes: Negative for visual disturbance.  Respiratory: Negative for shortness of breath.   Cardiovascular: Negative for chest pain.  Gastrointestinal: Positive for nausea. Negative for abdominal pain and vomiting.  Genitourinary: Negative for dysuria and hematuria.  Musculoskeletal: Negative for back pain.  Skin: Positive for wound. Negative for rash.  Neurological: Negative for headaches.     Physical Exam Updated Vital Signs BP 111/88 (BP Location: Left Arm)   Pulse 98   Temp 98.3 F (36.8 C) (Oral)   Resp 18   Ht 5' 6.5" (1.689 m)  Wt 83.5 kg (184 lb)   SpO2 96%   BMI 29.25 kg/m   Physical Exam  Constitutional: She appears well-developed and well-nourished. No distress.  HENT:  Head: Normocephalic and atraumatic.  Mouth/Throat: Oropharynx is clear and moist.  Eyes: Conjunctivae are normal.  Neck: Neck supple.  Cardiovascular: Normal rate, regular rhythm and intact distal pulses.  No  murmur heard. Pulmonary/Chest: Effort normal and breath sounds normal. No stridor. No respiratory distress. She has no wheezes.  Abdominal: Soft. There is no tenderness.  Musculoskeletal: She exhibits edema and tenderness.  Left leg she is got a well-healed incision.  Posterior to that she is got a surgical dressing overlying her new hip incision.  There is a vague area of erythema extending from her iliac crest down to her knee across her lateral leg with a little bit of extension on the medial side from the posterior aspect.  There is some diffuse tenderness but no crepitus in this area.  There is also some yellowing ecchymosis near her and surgical incision.  No obvious drainage.  She is got intact distal pulses.  Neurological: She is alert. GCS eye subscore is 4. GCS verbal subscore is 5. GCS motor subscore is 6.  Skin: Skin is warm and dry.  Psychiatric: She has a normal mood and affect.  Nursing note and vitals reviewed.      ED Treatments / Results  Labs (all labs ordered are listed, but only abnormal results are displayed) Labs Reviewed  BASIC METABOLIC PANEL - Abnormal; Notable for the following components:      Result Value   Calcium 8.6 (*)    All other components within normal limits  CBC WITH DIFFERENTIAL/PLATELET - Abnormal; Notable for the following components:   RBC 3.43 (*)    Hemoglobin 10.4 (*)    HCT 32.4 (*)    All other components within normal limits  CULTURE, BLOOD (ROUTINE X 2)  CULTURE, BLOOD (ROUTINE X 2)    EKG None  Radiology Dg Hip Unilat With Pelvis 2-3 Views Left  Result Date: 03/09/2018 CLINICAL DATA:  Left hip pain, swelling, redness and increased warmth. Status post left hip revision surgery 5 days ago. EXAM: DG HIP (WITH OR WITHOUT PELVIS) 2-3V LEFT COMPARISON:  07/25/2016. FINDINGS: A different left total hip prosthesis is currently demonstrated with methylmethacrylate beads. No fracture, dislocation, bone destruction or periosteal reaction.  IMPRESSION: Left hip prosthesis with no acute abnormality seen. Electronically Signed   By: Claudie Revering M.D.   On: 03/09/2018 12:46    Procedures Procedures (including critical care time)  Medications Ordered in ED Medications - No data to display   Initial Impression / Assessment and Plan / ED Course  I have reviewed the triage vital signs and the nursing notes.  Pertinent labs & imaging results that were available during my care of the patient were reviewed by me and considered in my medical decision making (see chart for details).  Clinical Course as of Mar 09 1848  Sat Mar 09, 2018  1444 Patient here status post right hip redo with increased swelling of her thigh some erythema on the lateral aspect of the thigh.  She has no white count here no fever negative x-ray and a negative DVT study.  I discussed with patient's surgeon covering attending Dr. Neldon Mc who felt that the patient could be discharged to home not on antibiotics and follow-up with him in the office on Monday.  I reviewed this with the patient she is comfortable  plan understands signs and symptoms that she is to watch out for..   [MB]    Clinical Course User Index [MB] Hayden Rasmussen, MD     Final Clinical Impressions(s) / ED Diagnoses   Final diagnoses:  Acute pain of left thigh    ED Discharge Orders    None       Hayden Rasmussen, MD 03/09/18 1850

## 2018-03-10 DIAGNOSIS — T84038A Mechanical loosening of other internal prosthetic joint, initial encounter: Secondary | ICD-10-CM

## 2018-03-10 DIAGNOSIS — Z96649 Presence of unspecified artificial hip joint: Secondary | ICD-10-CM

## 2018-03-10 HISTORY — DX: Presence of unspecified artificial hip joint: Z96.649

## 2018-03-10 HISTORY — DX: Mechanical loosening of other internal prosthetic joint, initial encounter: T84.038A

## 2018-03-14 LAB — CULTURE, BLOOD (ROUTINE X 2)
CULTURE: NO GROWTH
Culture: NO GROWTH
Special Requests: ADEQUATE
Special Requests: ADEQUATE

## 2018-03-15 ENCOUNTER — Encounter: Payer: Self-pay | Admitting: Family Medicine

## 2018-03-18 ENCOUNTER — Other Ambulatory Visit: Payer: Self-pay | Admitting: Family Medicine

## 2018-03-19 ENCOUNTER — Other Ambulatory Visit: Payer: Self-pay | Admitting: Family Medicine

## 2018-03-19 DIAGNOSIS — E038 Other specified hypothyroidism: Secondary | ICD-10-CM

## 2018-03-25 ENCOUNTER — Other Ambulatory Visit: Payer: Self-pay | Admitting: Family Medicine

## 2018-03-25 ENCOUNTER — Encounter: Payer: Self-pay | Admitting: Family Medicine

## 2018-03-25 DIAGNOSIS — L239 Allergic contact dermatitis, unspecified cause: Secondary | ICD-10-CM

## 2018-03-25 MED ORDER — TRIAMCINOLONE ACETONIDE 0.5 % EX OINT: 1.0000 "application " | TOPICAL_OINTMENT | Freq: Two times a day (BID) | CUTANEOUS | 1 refills | Status: AC

## 2018-03-29 IMAGING — MG 2D DIGITAL SCREENING BILATERAL MAMMOGRAM WITH 3D TOMO WITH CAD
9 of 12 series · 9 of 28 positions shown · non-contrast
Comparison: Previous exam(s).

CLINICAL DATA: Screening.

EXAM:
2D DIGITAL SCREENING BILATERAL MAMMOGRAM WITH 3D TOMO WITH CAD

[R MLO synth-2D]
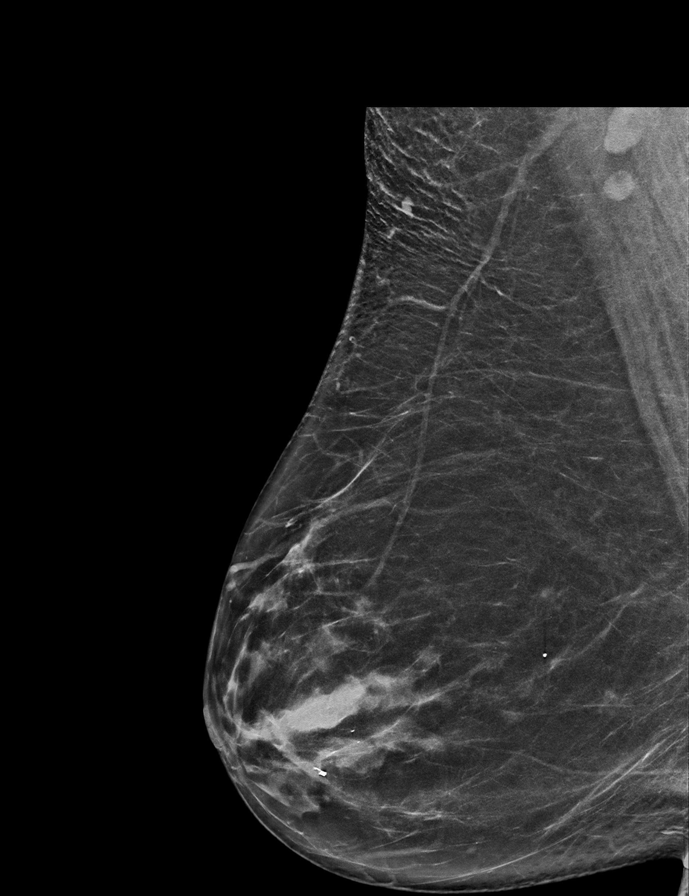

[L MLO]
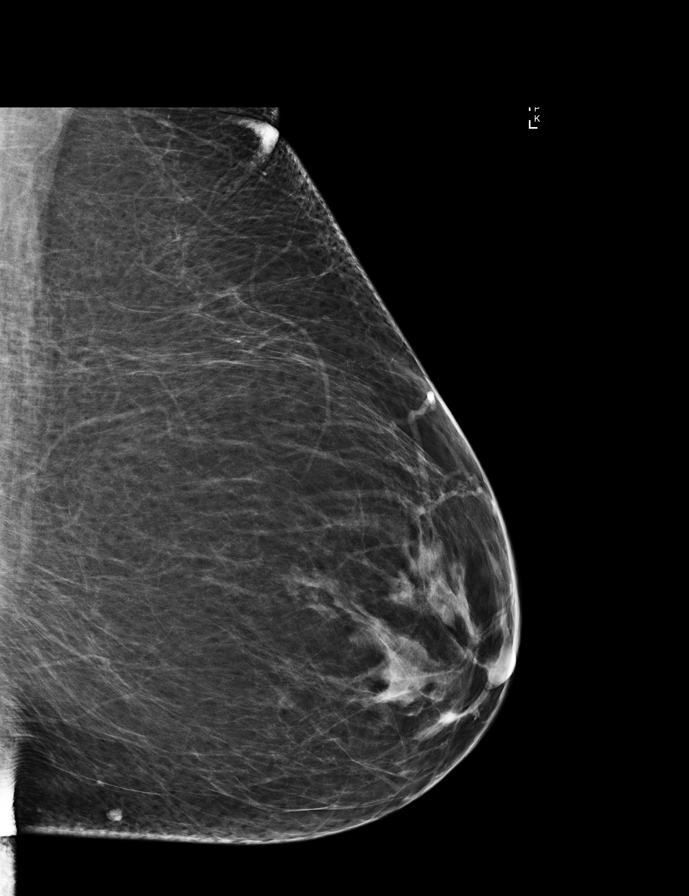

[L CC]
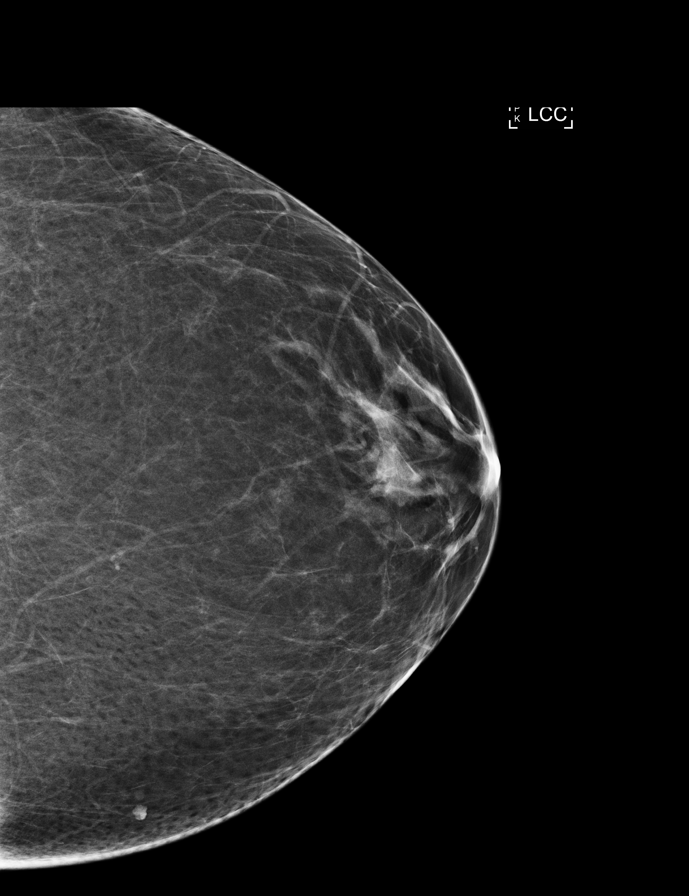

[R CC synth-2D]
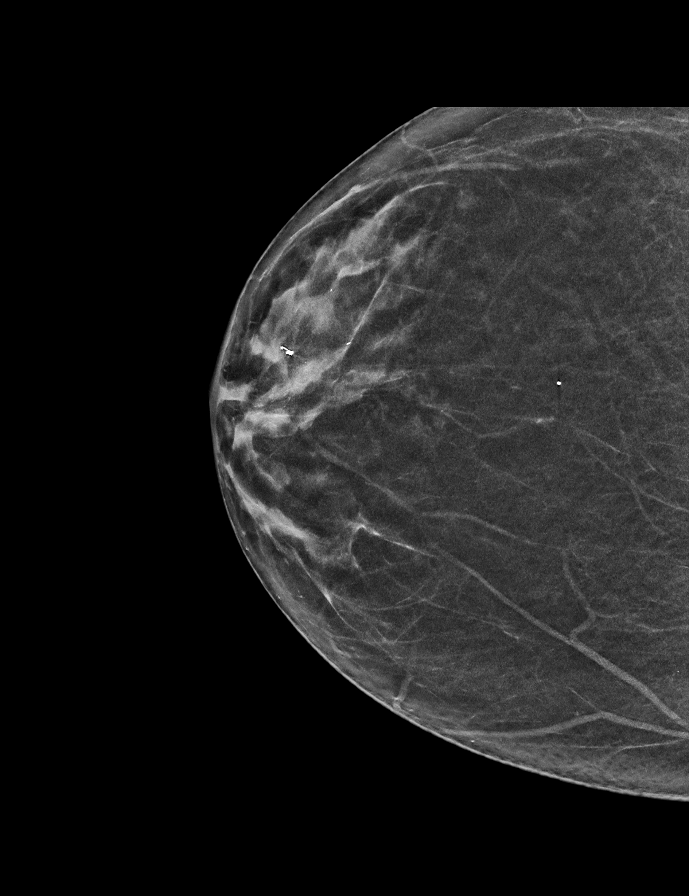

[R MLO]
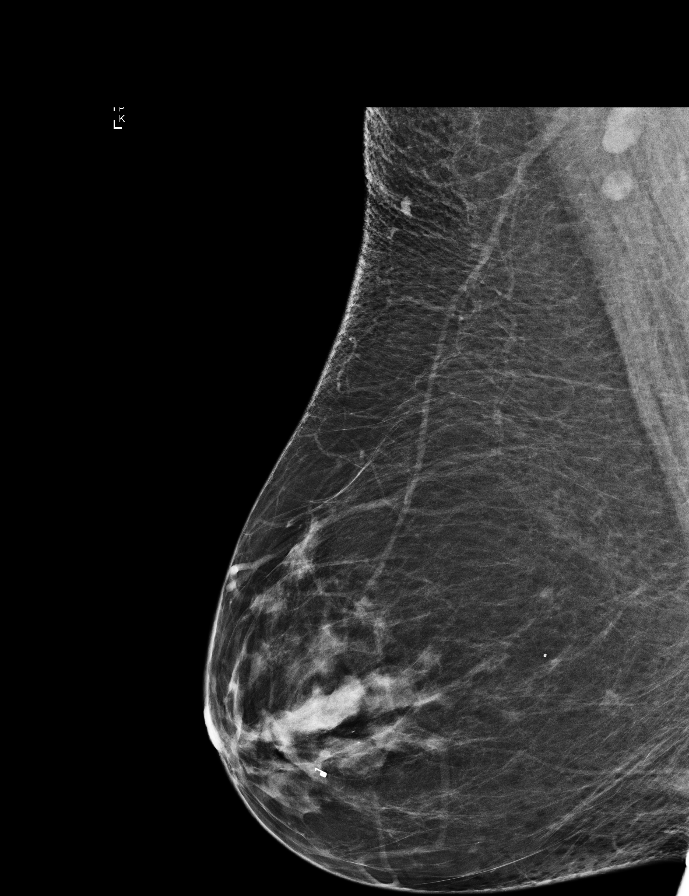

[L CC synth-2D]
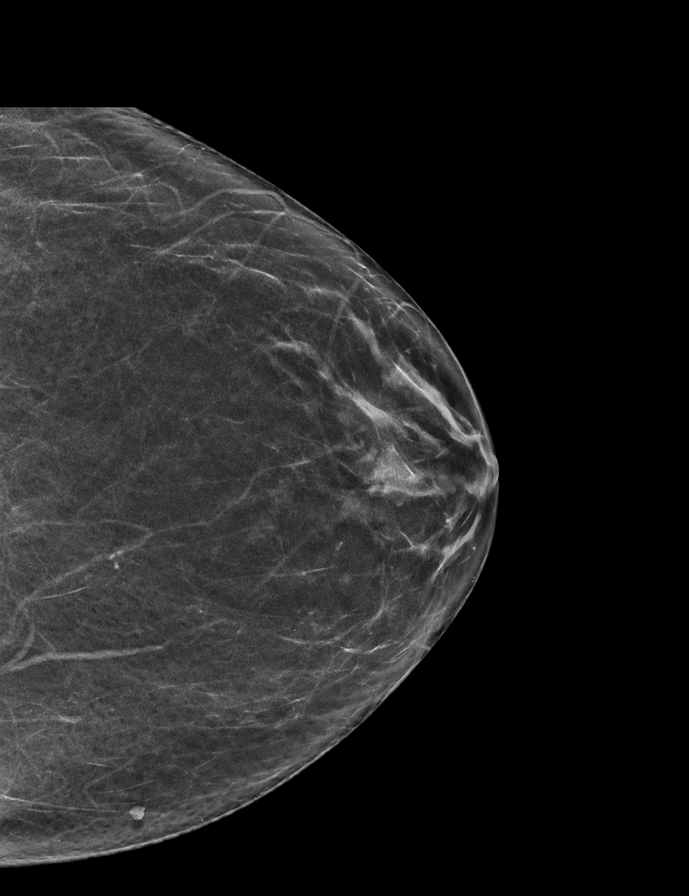

[L MLO synth-2D]
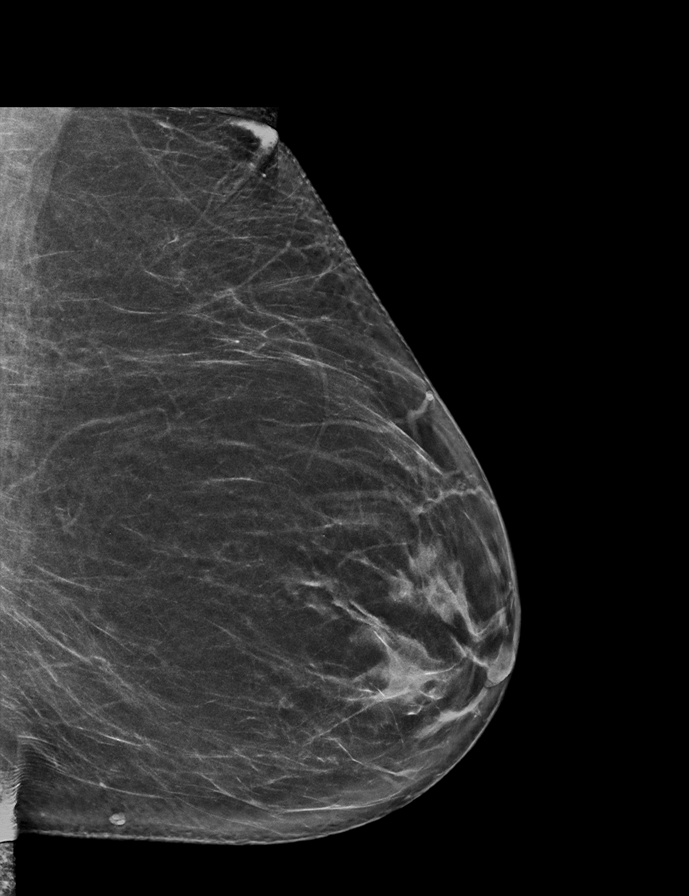

[R CC]
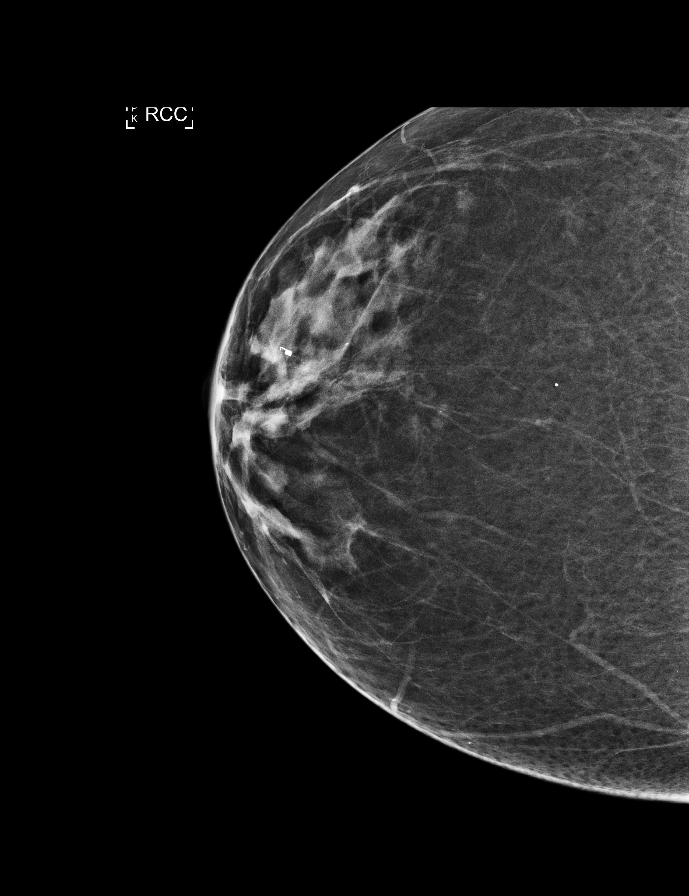

[L CC tomo · tomo slice 31/61.0]
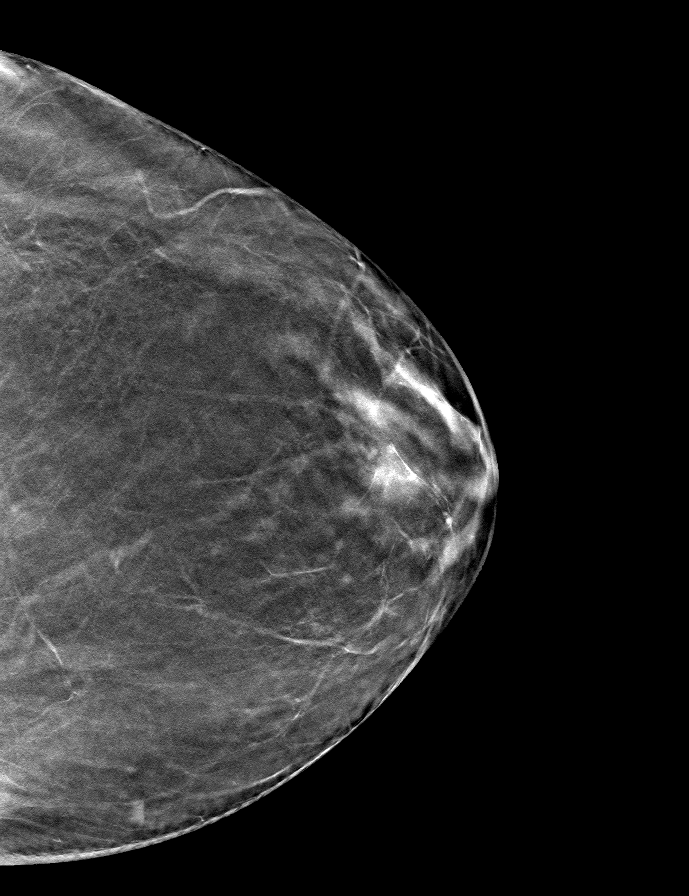

[9 of 28 positions shown; findings below may reference images not displayed]

ACR Breast Density Category b: There are scattered areas of
fibroglandular density.
FINDINGS: There are no findings suspicious for malignancy. Images were
processed with CAD.
IMPRESSION: No mammographic evidence of malignancy. A result letter of this
screening mammogram will be mailed directly to the patient.

RECOMMENDATION:
Screening mammogram in one year. (Code:[id number]-P-ZS0)

BI-RADS CATEGORY  1: Negative.

## 2018-04-10 DIAGNOSIS — Z96649 Presence of unspecified artificial hip joint: Secondary | ICD-10-CM

## 2018-04-10 HISTORY — DX: Presence of unspecified artificial hip joint: Z96.649

## 2018-04-18 ENCOUNTER — Ambulatory Visit (INDEPENDENT_AMBULATORY_CARE_PROVIDER_SITE_OTHER): Payer: BLUE CROSS/BLUE SHIELD | Admitting: Family Medicine

## 2018-04-18 VITALS — BP 102/72 | HR 85 | Temp 98.2°F | Ht 67.0 in | Wt 183.0 lb

## 2018-04-18 DIAGNOSIS — E559 Vitamin D deficiency, unspecified: Secondary | ICD-10-CM

## 2018-04-18 DIAGNOSIS — Z9189 Other specified personal risk factors, not elsewhere classified: Secondary | ICD-10-CM

## 2018-04-18 DIAGNOSIS — E669 Obesity, unspecified: Secondary | ICD-10-CM | POA: Diagnosis not present

## 2018-04-18 DIAGNOSIS — Z6830 Body mass index (BMI) 30.0-30.9, adult: Secondary | ICD-10-CM | POA: Diagnosis not present

## 2018-04-18 MED ORDER — VITAMIN D (ERGOCALCIFEROL) 1.25 MG (50000 UNIT) PO CAPS
50000.0000 [IU] | ORAL_CAPSULE | ORAL | 0 refills | Status: DC
Start: 2018-04-18 — End: 2018-05-21

## 2018-04-22 NOTE — Progress Notes (Signed)
Office: [phone]  /  Fax: [phone]   HPI:   Chief Complaint: OBESITY Molly Giles is here to discuss her progress with her obesity treatment plan. She is on the keep a food journal with 1200 to 1500 calories and 90 grams of protein daily and is following her eating plan approximately 0 % of the time. She states she is exercising 20 to 45 minutes 2 to 7 times per week. [name] last visit was over 6 months ago. Molly Giles s starting to regain weight and she wants to get back on track. Molly Giles is not journaling, but she is still mindful. She is recovering from her second hip replacement. Her weight is 183 lb (83 kg) today and has had a weight gain of 3 pounds over a period of 27 weeks since her last visit. She has lost 21 lbs since starting treatment with [name].  Vitamin D deficiency Molly Giles has a diagnosis of vitamin D deficiency. She is currently taking vit D and last lab was almost at goal, but there is no current lab. Molly Giles denies nausea, vomiting or muscle weakness.  At risk for osteopenia and osteoporosis Molly Giles is at higher risk of osteopenia and osteoporosis due to vitamin D deficiency.   ALLERGIES: Allergies  Allergen Reactions  . Sulfa Antibiotics Itching  . Chlorhexidine Gluconate Other (See Comments) and Rash    Burning  . Codeine Nausea Only    "head nausea"   . Lyrica [Pregabalin] Other (See Comments)    Dizziness, nausea  . Methylprednisolone     Flushing of the face  . Prednisone     Flushing of the face  . Betadine [Povidone Iodine] Rash    burning  . Latex Itching and Rash    burning  . Wellbutrin [Bupropion] Anxiety    MEDICATIONS: Current Outpatient Medications on File Prior to Visit  Medication Sig Dispense Refill  . aspirin EC 325 MG tablet TAKE 1 TABLET BY MOUTH TWO TIMES DAILY FOR 6 WEEKS UNTIL 04/16/18, THEN TAKE 1 TABLET ONCE DAILY FROM 04/17/18 TO 05/29/18  0  . citalopram (CELEXA) 20 MG tablet TAKE 1 TABLET(20 MG) BY MOUTH DAILY 90 tablet 0  . eletriptan  (RELPAX) 40 MG tablet Take 1 tablet (40 mg total) by mouth as needed for migraine. may repeat in 2 hours if necessary 10 tablet 0  . Fremanezumab-vfrm (AJOVY) 225 MG/1.5ML SOSY Inject 225 mg into the skin every 30 (thirty) days. 1 Syringe 11  . levothyroxine (SYNTHROID, LEVOTHROID) 125 MCG tablet TAKE 1/2 TABLET BY MOUTH DAILY 90 tablet 3  . loratadine (CLARITIN) 10 MG tablet Take 10 mg by mouth daily.    . methocarbamol (ROBAXIN) 500 MG tablet Take 500 mg by mouth 3 (three) times daily.  2  . oxyCODONE (OXY IR/ROXICODONE) 5 MG immediate release tablet Take 1-2 tablets by mouth every 4 (four) hours as needed for pain. for pain  0  . prednisoLONE acetate (PRED FORTE) 1 % ophthalmic suspension Place 1 drop into both eyes daily.    Marland Kitchen PREMPRO 0.3-1.5 MG tablet TAKE 1 TABLET BY MOUTH AT BEDTIME 84 tablet 3  . rosuvastatin (CRESTOR) 10 MG tablet TAKE 1 TABLET(10 MG) BY MOUTH DAILY 90 tablet 3  . STOOL SOFTENER/LAXATIVE 50-8.6 MG tablet TAKE 2 TABLETS BY MOUTH TWO TIMES DAILY FOR 14 DAYS  0  . traMADol (ULTRAM) 50 MG tablet Take 2 tablets (100 mg total) by mouth 2 (two) times daily. 120 tablet 0  . triamcinolone (NASACORT) 55 MCG/ACT AERO nasal inhaler  Place into the nose.    . zolpidem (AMBIEN) 10 MG tablet Take 1 tablet (10 mg total) by mouth at bedtime as needed. 30 tablet 2   No current facility-administered medications on file prior to visit.     PAST MEDICAL HISTORY: Past Medical History:  Diagnosis Date  . Allergic rhinoconjunctivitis 07/02/2015  . Arthritis   . Benign positional vertigo 04/2015   Responded well to [facility]  . Bruxism (teeth grinding)   . Chronic migraine 02/25/2016   Per Dr Jaynee Eagles review in notes from [facility] from September 2015. Showed total headache days last month 18. Severe headache days 7 days. Moderate headache days 5 days. Mild headache days last month sick days. Days without headache last month 10 days. Symptoms associated with  photophobia, phonophobia, osmophobia, neck pain, dizziness, jaw pain, nasal congestion, vision disturbances, tingling and numbness, weakness and worsening with activity. Each headache attack last 3 hours depending on treatment in severity. Left side, the right side, easier side, the frontal area in the back of the head. Characterized as throbbing, pressure, tightness, squeezing, stabbing and burning   . Chronic migraine w/o aura w/o status migrainosus, not intractable 07/14/2015  . DDD (degenerative disc disease), lumbar   . Degenerative cervical disc   . Depression   . Disc displacement, lumbar   . Dysrhythmia    seen by dr Wynonia Lawman- not a problem since she has been on Bystolic  . Essential hypertension 05/15/2013  . Family history of adverse reaction to anesthesia    Brother- N/V  . Family history of premature CAD 05/15/2013  . Fibromyalgia   . GERD (gastroesophageal reflux disease) 12/16/2014  . H/O seasonal allergies   . Hammer toe    Left great toe  . Hearing loss of both ears 07/27/2015   mild to borderline moderate low frequency hearing loss improving to within normal limits bilaterally on audiology testing at [facility] in November 2016.    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 History of irritable bowel syndrome 02/15/2016  . History of migraine headaches 05/15/2013   Per Dr Jaynee Eagles review in notes from [facility] from September 2015. Showed total headache days last month 18. Severe headache days 7 days. Moderate headache days 5 days. Mild headache days last month sick days. Days without headache last month 10 days. Symptoms associated with photophobia, phonophobia, osmophobia, neck pain, dizziness, jaw pain, nasal congestion, vision disturbances, tingling and numbness, weakness and worsening with activity. Each headache attack last 3 hours depending on treatment in severity. Left side, the right side, easier  side, the frontal area in the back of the head. Characterized as throbbing, pressure, tightness, squeezing, stabbing and burning    . Hx of bad fall 02/2015   Severe Facial/head trauma without fracture  . Hyperlipidemia 1998  . Hypertension 2004  . Hypothyroidism   . Impairment of balance 02/2015   Consequent of postconcussive syndrome  . Interstitial cystitis   . 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[place]) 03/10/2018   [facility]  . Lumbar facet joint pain   . 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 Migraine 1962   Chronic Migraines  . Mood disorder ([place])   . Musculoskeletal neck pain 07/14/2015  . Normal coronary arteries 05/14/2014  . Osteoarthritis of left hip 11/01/2015   MRI order by Dr Alvan Dame (ortho) 10/2015 showed significant arthritis of left hip joint with cystic changes in femoral head c/w osteoarthritis  .  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 Overweight   . Pain in joint of left shoulder 11/09/2016  . Palpitations 05/15/2013   [facility] manages  . Periodic limb movement sleep disorder 03/28/2017  . Perirectal cyst 05/07/2016  . PONV (postoperative nausea and vomiting)   . Post concussion syndrome 06/06/2015  . Post concussive syndrome 07/14/2015   Ms Alvarez's post-concussive syndrome manifesting in vertigo and headache, mood changes, poor balance, dizziness, and decreased concentration per Dr Jaynee Eagles at [facility].   Marland Kitchen Posterior vitreous detachment of right eye 2014  . Shingles   . Shortness of breath dyspnea    with exertion  . Thyroid nodule 08/11/2009   Findings: The thyroid gland is within normal limits in size.  The gland is diffusely inhomogeneous. A small solid nodule is noted in the lower pole  medially on the right of 7 x 6 x 8 mm. A small solid nodule is noted inferiorly on the left of 3 x 3 x 4 mm.  IMPRESSION:  The thyroid gland is within normal limits in size with only small solid nodules present, the largest of  only 8 mm in diameter on the right.    . 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   . Vasomotor symptoms due to menopause 04/19/2017    PAST SURGICAL HISTORY: Past Surgical History:  Procedure Laterality Date  . Bladder dilitation     x 3  . BREAST BIOPSY  2011   Benign histology  . CARDIOVASCULAR STRESS TEST  2000   Unremarkable per pt report  . CARPOMETACARPAL JOINT ARTHROTOMY Right 2011  . COLONOSCOPY    . COLONOSCOPY WITH PROPOFOL N/A 04/21/2015   Procedure: COLONOSCOPY WITH PROPOFOL;  Surgeon: Carol Ada, MD;  Location: [facility];  Service: Endoscopy;  Laterality: N/A;  . EPIDURAL BLOCK INJECTION Left 04/12/2016   Left Medial Nerve Block and Left L5 ramus block, Dr Suella Broad   . EPIDURAL BLOCK INJECTION  03/21/2016   Left L3-4 medial branch block and Left L5 & dorsal ramus block   . EPIDURAL BLOCK INJECTION N/A 10/25/2016   Suella Broad, MD. Lumbar medial branch block  . EPIDURAL BLOCK INJECTION N/A 02/09/2017   Suella Broad, MD.  Bilateral L3/4 medial branch block, bilateral L5 dorsal ramus block  . EPIDURAL BLOCK INJECTION N/A 07/04/2017   Suella Broad, MD  . FECAL TRANSPLANT  04/21/2015   Procedure: FECAL TRANSPLANT;  Surgeon: Carol Ada, MD;  Location: [place];  Service: Endoscopy;;  . HIP ARTHROPLASTY Left   . HIP ARTHROSCOPY Left 03/06/2018   Left hip arthroplasty, redo for loose hip arthroplasty. Procedure at [facility] hospital  . INJECTION HIP INTRA ARTICULAR Left 11/2015   for OA by Dr Suella Broad  . OTHER SURGICAL HISTORY Left 2016   Left L3/L4 medial nerve block and Left L5 Dorsal Ramus block Dr Mickel Duhamel  . TOTAL HIP ARTHROPLASTY Left 07/25/2016   Procedure: LEFT TOTAL HIP ARTHROPLASTY ANTERIOR APPROACH;  Surgeon: Paralee Cancel, MD;  Location: [facility];  Service: Orthopedics;  Laterality: Left;    SOCIAL HISTORY: Social History   Tobacco Use  . Smoking status: Passive Smoke  Exposure - Never Smoker  . Smokeless tobacco: Never Used  . Tobacco comment: As a child  Substance Use Topics  . Alcohol use: No    Alcohol/week: 0.0 standard drinks    Comment: No use in 30 years.  . Drug  use: No    FAMILY HISTORY: Family History  Problem Relation Age of Onset  . Alzheimer's disease Mother   . Hyperlipidemia Mother   . Hypertension Mother   . Osteoporosis Mother   . Parkinson's disease Mother   . Migraines Sister   . Allergies Sister   . Hypertension Sister   . Hyperlipidemia Brother   . Cardiomyopathy Brother   . Diabetes type II Brother   . Kidney disease Brother   . Asthma Brother   . Heart disease Brother   . Hypertension Brother   . Hyperlipidemia Brother   . Kidney disease Brother   . Heart disease Father   . Hyperlipidemia Father   . Hypertension Father   . Aortic aneurysm Father   . Early death Father 29  . Diabetes type II Unknown   . Breast cancer Maternal Aunt     ROS: Review of Systems  Constitutional: Negative for weight loss.  Gastrointestinal: Negative for nausea and vomiting.  Musculoskeletal:       Negative for muscle weakness    PHYSICAL EXAM: Blood pressure 102/72, pulse 85, temperature 98.2 F (36.8 C), temperature source Oral, height 5\' 7"  (1.702 m), weight 183 lb (83 kg), SpO2 98 %. Body mass index is 28.66 kg/m. Physical Exam  Constitutional: She is oriented to person, place, and time. She appears well-developed and well-nourished.  Cardiovascular: Normal rate.  Pulmonary/Chest: Effort normal.  Musculoskeletal: Normal range of motion.  Neurological: She is oriented to person, place, and time.  Skin: Skin is warm and dry.  Psychiatric: She has a normal mood and affect. Her behavior is normal.  Vitals reviewed.   RECENT LABS AND TESTS: BMET    Component Value Date/Time   NA 139 03/09/2018 1118   NA 142 07/04/2017 1021   K 3.8 03/09/2018 1118   CL 104 03/09/2018 1118   CO2 27 03/09/2018 1118   GLUCOSE 86  03/09/2018 1118   BUN 12 03/09/2018 1118   BUN 13 07/04/2017 1021   CREATININE 0.62 03/09/2018 1118   CREATININE 0.74 02/24/2016 1158   CALCIUM 8.6 (L) 03/09/2018 1118   GFRNONAA >60 03/09/2018 1118   GFRNONAA 88 02/24/2016 1158   GFRAA >60 03/09/2018 1118   GFRAA >89 02/24/2016 1158   Lab Results  Component Value Date   HGBA1C 5.0 02/07/2018   HGBA1C 5.2 07/04/2017   HGBA1C 5.3 03/22/2017   Lab Results  Component Value Date   INSULIN 11.8 07/04/2017   INSULIN 10.5 03/22/2017   CBC    Component Value Date/Time   WBC 6.8 03/09/2018 1118   RBC 3.43 (L) 03/09/2018 1118   HGB 10.4 (L) 03/09/2018 1118   HGB 12.9 03/22/2017 1057   HCT 32.4 (L) 03/09/2018 1118   HCT 39.6 03/22/2017 1057   PLT 271 03/09/2018 1118   MCV 94.5 03/09/2018 1118   MCV 90 03/22/2017 1057   MCH 30.3 03/09/2018 1118   MCHC 32.1 03/09/2018 1118   RDW 14.7 03/09/2018 1118   RDW 14.3 03/22/2017 1057   LYMPHSABS 1.6 03/09/2018 1118   LYMPHSABS 2.1 03/22/2017 1057   MONOABS 0.7 03/09/2018 1118   EOSABS 0.5 03/09/2018 1118   EOSABS 0.1 03/22/2017 1057   BASOSABS 0.0 03/09/2018 1118   BASOSABS 0.0 03/22/2017 1057   Iron/TIBC/Ferritin/ %Sat No results found for: IRON, TIBC, FERRITIN, IRONPCTSAT Lipid Panel     Component Value Date/Time   CHOL 150 07/04/2017 1021   TRIG 119 07/04/2017 1021   HDL 48 07/04/2017 1021  CHOLHDL 3.9 12/14/2014 1036   VLDL 26 12/14/2014 1036   LDLCALC 78 07/04/2017 1021   Hepatic Function Panel     Component Value Date/Time   PROT 6.4 07/04/2017 1021   ALBUMIN 4.3 07/04/2017 1021   AST 16 07/04/2017 1021   ALT 17 07/04/2017 1021   ALKPHOS 86 07/04/2017 1021   BILITOT 0.3 07/04/2017 1021      Component Value Date/Time   TSH 3.880 07/04/2017 1021   TSH 3.190 03/22/2017 1057   TSH 4.000 01/25/2017 1109   Results for KAMSIYOCHUKWU, BUIST (MRN [id number]) as of 04/22/2018 08:02  Ref. Range 07/04/2017 10:21  Vitamin D, 25-Hydroxy Latest Ref Range: 30.0 - 100.0 ng/mL 48.5    ASSESSMENT AND PLAN: Vitamin D deficiency - Plan: Vitamin D, Ergocalciferol, (DRISDOL) 50000 units CAPS capsule  At risk for osteoporosis  Class 1 obesity with serious comorbidity and body mass index (BMI) of 30.0 to 30.9 in adult, unspecified obesity type - beginning BMI >30  PLAN:  Vitamin D Deficiency Molly Giles was informed that low vitamin D levels contributes to fatigue and are associated with obesity, breast, and colon cancer. She agrees to continue to take prescription Vit D @50 ,000 IU every week #4 with no refills and will follow up for routine testing of vitamin D, at least 2-3 times per year. She was informed of the risk of over-replacement of vitamin D and agrees to not increase her dose unless she discusses this with [name] first. We will recheck labs at the next visit ad Molly Giles agrees to follow up as directed.  At risk for osteopenia and osteoporosis Molly Giles is at risk for osteopenia and osteoporosis due to her vitamin D deficiency. She was encouraged to take her vitamin D and follow her higher calcium diet and increase strengthening exercise to help strengthen her bones and decrease her risk of osteopenia and osteoporosis.  Obesity Molly Giles is currently in the action stage of change. As such, her goal is to continue with weight loss efforts She has agreed to follow the Pescatarian eating plan Molly Giles has been instructed to work up to a goal of 150 minutes of combined cardio and strengthening exercise per week for weight loss and overall health benefits. We discussed the following Behavioral Modification Strategies today: increasing lean protein intake, decreasing simple carbohydrates  and decrease eating out  Molly Giles has agreed to follow up with our clinic in 2 to 3 weeks. She was informed of the importance of frequent follow up visits to maximize her success with intensive lifestyle modifications for her multiple health conditions.   OBESITY BEHAVIORAL INTERVENTION VISIT  Today's visit  was # 13 out of 22.  Starting weight: 204 lbs Starting date: 03/22/18 Today's weight : 183 lbs Today's date: 04/18/2018 Total lbs lost to date: 21    ASK: We discussed the diagnosis of obesity with Molly Giles today and Molly Giles agreed to give [name] permission to discuss obesity behavioral modification therapy today.  ASSESS: Molly Giles has the diagnosis of obesity and her BMI today is 28.66 Molly Giles is in the action stage of change   ADVISE: Molly Giles was educated on the multiple health risks of obesity as well as the benefit of weight loss to improve her health. She was advised of the need for long term treatment and the importance of lifestyle modifications.  AGREE: Multiple dietary modification options and treatment options were discussed and  Molly Giles agreed to the above obesity treatment plan.  Corey Skains, am acting as Location manager for [employer]  Leafy Ro, MD  I have reviewed the above documentation for accuracy and completeness, and I agree with the above. -Dennard Nip, MD

## 2018-04-23 ENCOUNTER — Encounter (INDEPENDENT_AMBULATORY_CARE_PROVIDER_SITE_OTHER): Payer: Self-pay | Admitting: Family Medicine

## 2018-05-07 ENCOUNTER — Ambulatory Visit (INDEPENDENT_AMBULATORY_CARE_PROVIDER_SITE_OTHER): Payer: BLUE CROSS/BLUE SHIELD | Admitting: Physician Assistant

## 2018-05-07 VITALS — BP 93/68 | HR 76 | Temp 98.1°F | Ht 67.0 in | Wt 183.0 lb

## 2018-05-07 DIAGNOSIS — D508 Other iron deficiency anemias: Secondary | ICD-10-CM

## 2018-05-07 DIAGNOSIS — E8881 Metabolic syndrome: Secondary | ICD-10-CM

## 2018-05-07 DIAGNOSIS — E7849 Other hyperlipidemia: Secondary | ICD-10-CM

## 2018-05-07 DIAGNOSIS — E66811 Obesity, class 1: Secondary | ICD-10-CM

## 2018-05-07 DIAGNOSIS — E88819 Insulin resistance, unspecified: Secondary | ICD-10-CM

## 2018-05-07 DIAGNOSIS — Z9189 Other specified personal risk factors, not elsewhere classified: Secondary | ICD-10-CM

## 2018-05-07 DIAGNOSIS — E559 Vitamin D deficiency, unspecified: Secondary | ICD-10-CM | POA: Diagnosis not present

## 2018-05-07 DIAGNOSIS — Z6830 Body mass index (BMI) 30.0-30.9, adult: Secondary | ICD-10-CM

## 2018-05-07 DIAGNOSIS — E669 Obesity, unspecified: Secondary | ICD-10-CM

## 2018-05-08 LAB — LIPID PANEL WITH LDL/HDL RATIO
CHOLESTEROL TOTAL: 145 mg/dL (ref 100–199)
HDL: 47 mg/dL (ref >39)
LDL Calculated: 75 mg/dL (ref 0–99)
LDL/HDL RATIO: 1.6 ratio (ref 0.0–3.2)
TRIGLYCERIDES: 115 mg/dL (ref 0–149)
VLDL Cholesterol Cal: 23 mg/dL (ref 5–40)

## 2018-05-08 LAB — COMPREHENSIVE METABOLIC PANEL
A/G RATIO: 1.7 (ref 1.2–2.2)
ALT: 15 IU/L (ref 0–32)
AST: 16 IU/L (ref 0–40)
Albumin: 4.2 g/dL (ref 3.6–4.8)
Alkaline Phosphatase: 79 IU/L (ref 39–117)
BILIRUBIN TOTAL: 0.2 mg/dL (ref 0.0–1.2)
BUN/Creatinine Ratio: 20 (ref 12–28)
BUN: 16 mg/dL (ref 8–27)
CO2: 22 mmol/L (ref 20–29)
Calcium: 9.3 mg/dL (ref 8.7–10.3)
Chloride: 102 mmol/L (ref 96–106)
Creatinine, Ser: 0.8 mg/dL (ref 0.57–1.00)
GFR calc non Af Amer: 79 mL/min/{1.73_m2} (ref >59)
GFR, EST AFRICAN AMERICAN: 91 mL/min/{1.73_m2} (ref >59)
GLUCOSE: 85 mg/dL (ref 65–99)
Globulin, Total: 2.5 g/dL (ref 1.5–4.5)
POTASSIUM: 4.4 mmol/L (ref 3.5–5.2)
Sodium: 138 mmol/L (ref 134–144)
TOTAL PROTEIN: 6.7 g/dL (ref 6.0–8.5)

## 2018-05-08 LAB — CBC WITH DIFFERENTIAL
BASOS ABS: 0 10*3/uL (ref 0.0–0.2)
Basos: 0 %
EOS (ABSOLUTE): 0.2 10*3/uL (ref 0.0–0.4)
EOS: 3 %
HEMOGLOBIN: 12.6 g/dL (ref 11.1–15.9)
Hematocrit: 38.8 % (ref 34.0–46.6)
Immature Grans (Abs): 0 10*3/uL (ref 0.0–0.1)
Immature Granulocytes: 0 %
LYMPHS ABS: 2.5 10*3/uL (ref 0.7–3.1)
Lymphs: 37 %
MCH: 29.4 pg (ref 26.6–33.0)
MCHC: 32.5 g/dL (ref 31.5–35.7)
MCV: 90 fL (ref 79–97)
MONOCYTES: 8 %
MONOS ABS: 0.6 10*3/uL (ref 0.1–0.9)
Neutrophils Absolute: 3.5 10*3/uL (ref 1.4–7.0)
Neutrophils: 52 %
RBC: 4.29 x10E6/uL (ref 3.77–5.28)
RDW: 14 % (ref 12.3–15.4)
WBC: 6.7 10*3/uL (ref 3.4–10.8)

## 2018-05-08 LAB — INSULIN, RANDOM: INSULIN: 8.3 u[IU]/mL (ref 2.6–24.9)

## 2018-05-08 LAB — VITAMIN D 25 HYDROXY (VIT D DEFICIENCY, FRACTURES): VIT D 25 HYDROXY: 43.3 ng/mL (ref 30.0–100.0)

## 2018-05-08 NOTE — Progress Notes (Signed)
Office: [phone]  /  Fax: [phone]   HPI:   Chief Complaint: OBESITY Molly Giles is here to discuss her progress with her obesity treatment plan. She is on the Pescatarian eating plan and is following her eating plan approximately 50 % of the time. She states she is doing physical therapy 30 minutes 7 times per week. [name] did well with weight maintenance. She would like to journal dinner in order to give her more freedom. Her weight is 183 lb (83 kg) today and she has maintained weight over a period of 2 weeks since her last visit. She has lost 21 lbs since starting treatment with [name].  Hyperlipidemia Leala has hyperlipidemia and she is on rosuvastatin. She has been trying to improve her cholesterol levels with intensive lifestyle modification including a low saturated fat diet, exercise and weight loss. She denies any chest pain but she does report muscle aches.  Insulin Resistance Ellise has a diagnosis of insulin resistance based on her elevated fasting insulin level >5. Although Angely's blood glucose readings are still under good control, insulin resistance puts her at greater risk of metabolic syndrome and diabetes. She is not taking metformin currently and continues to work on diet and exercise to decrease risk of diabetes. Mazal denies  polyphagia.  Anemia Aubreyana has a diagnosis of anemia. Her last hemoglobin was at 10.4 and she denies shortness of breath or fatigue. She is not on iron supplementation.  Vitamin D deficiency Leeyah has a diagnosis of vitamin D deficiency. She is currently taking prescription vit D and denies nausea, vomiting or muscle weakness.  At risk for osteopenia and osteoporosis Dontasia is at higher risk of osteopenia and osteoporosis due to vitamin D deficiency.   ALLERGIES: Allergies  Allergen Reactions  . Sulfa Antibiotics Itching  . Chlorhexidine Gluconate Other (See Comments) and Rash    Burning  . Codeine Nausea Only    "head nausea"   . Lyrica  [Pregabalin] Other (See Comments)    Dizziness, nausea  . Methylprednisolone     Flushing of the face  . Prednisone     Flushing of the face  . Betadine [Povidone Iodine] Rash    burning  . Latex Itching and Rash    burning  . Wellbutrin [Bupropion] Anxiety    MEDICATIONS: Current Outpatient Medications on File Prior to Visit  Medication Sig Dispense Refill  . aspirin EC 325 MG tablet TAKE 1 TABLET BY MOUTH TWO TIMES DAILY FOR 6 WEEKS UNTIL 04/16/18, THEN TAKE 1 TABLET ONCE DAILY FROM 04/17/18 TO 05/29/18  0  . citalopram (CELEXA) 20 MG tablet TAKE 1 TABLET(20 MG) BY MOUTH DAILY 90 tablet 0  . eletriptan (RELPAX) 40 MG tablet Take 1 tablet (40 mg total) by mouth as needed for migraine. may repeat in 2 hours if necessary 10 tablet 0  . Fremanezumab-vfrm (AJOVY) 225 MG/1.5ML SOSY Inject 225 mg into the skin every 30 (thirty) days. 1 Syringe 11  . levothyroxine (SYNTHROID, LEVOTHROID) 125 MCG tablet TAKE 1/2 TABLET BY MOUTH DAILY 90 tablet 3  . loratadine (CLARITIN) 10 MG tablet Take 10 mg by mouth daily.    . methocarbamol (ROBAXIN) 500 MG tablet Take 500 mg by mouth 3 (three) times daily.  2  . prednisoLONE acetate (PRED FORTE) 1 % ophthalmic suspension Place 1 drop into both eyes daily.    Marland Kitchen PREMPRO 0.3-1.5 MG tablet TAKE 1 TABLET BY MOUTH AT BEDTIME 84 tablet 3  . rosuvastatin (CRESTOR) 10 MG tablet TAKE 1 TABLET(10  MG) BY MOUTH DAILY 90 tablet 3  . traMADol (ULTRAM) 50 MG tablet Take 2 tablets (100 mg total) by mouth 2 (two) times daily. 120 tablet 0  . triamcinolone (NASACORT) 55 MCG/ACT AERO nasal inhaler Place into the nose.    . Vitamin D, Ergocalciferol, (DRISDOL) 50000 units CAPS capsule Take 1 capsule (50,000 Units total) by mouth every 7 (seven) days. 4 capsule 0  . zolpidem (AMBIEN) 10 MG tablet Take 1 tablet (10 mg total) by mouth at bedtime as needed. 30 tablet 2   No current facility-administered medications on file prior to visit.     PAST MEDICAL HISTORY: Past  Medical History:  Diagnosis Date  . Allergic rhinoconjunctivitis 07/02/2015  . Arthritis   . Benign positional vertigo 04/2015   Responded well to [facility] Rehab  . Bruxism (teeth grinding)   . Chronic migraine 02/25/2016   Per Dr Jaynee Eagles review in notes from [facility] from September 2015. Showed total headache days last month 18. Severe headache days 7 days. Moderate headache days 5 days. Mild headache days last month sick days. Days without headache last month 10 days. Symptoms associated with photophobia, phonophobia, osmophobia, neck pain, dizziness, jaw pain, nasal congestion, vision disturbances, tingling and numbness, weakness and worsening with activity. Each headache attack last 3 hours depending on treatment in severity. Left side, the right side, easier side, the frontal area in the back of the head. Characterized as throbbing, pressure, tightness, squeezing, stabbing and burning   . Chronic migraine w/o aura w/o status migrainosus, not intractable 07/14/2015  . DDD (degenerative disc disease), lumbar   . Degenerative cervical disc   . Depression   . Disc displacement, lumbar   . Dysrhythmia    seen by dr Wynonia Lawman- not a problem since she has been on Bystolic  . Essential hypertension 05/15/2013  . Family history of adverse reaction to anesthesia    Brother- N/V  . Family history of premature CAD 05/15/2013  . Fibromyalgia   . GERD (gastroesophageal reflux disease) 12/16/2014  . H/O seasonal allergies   . Hammer toe    Left great toe  . Hearing loss of both ears 07/27/2015   mild to borderline moderate low frequency hearing loss improving to within normal limits bilaterally on audiology testing at [facility] in November 2016.    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 History of irritable bowel syndrome 02/15/2016  . History of migraine headaches 05/15/2013   Per Dr Jaynee Eagles review in notes from [facility] from September 2015. Showed total headache days last month 18. Severe headache days 7 days. Moderate headache days 5 days. Mild headache days last month sick days. Days without headache last month 10 days. Symptoms associated with photophobia, phonophobia, osmophobia, neck pain, dizziness, jaw pain, nasal congestion, vision disturbances, tingling and numbness, weakness and worsening with activity. Each headache attack last 3 hours depending on treatment in severity. Left side, the right side, easier side, the frontal area in the back of the head. Characterized as throbbing, pressure, tightness, squeezing, stabbing and burning    . Hx of bad fall 02/2015   Severe Facial/head trauma without fracture  . Hyperlipidemia 1998  . Hypertension 2004  . Hypothyroidism   . Impairment of balance 02/2015   Consequent of postconcussive syndrome  . Interstitial cystitis   . 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[place]) 03/10/2018   [facility]  . Lumbar facet joint pain   .  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 Migraine 1962   Chronic Migraines  . Mood disorder ([place])   . Musculoskeletal neck pain 07/14/2015  . Normal coronary arteries 05/14/2014  . Osteoarthritis of left hip 11/01/2015   MRI order by Dr Alvan Dame (ortho) 10/2015 showed significant arthritis of left hip joint with cystic changes in femoral head c/w osteoarthritis  . 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 Overweight   . Pain in joint of left shoulder 11/09/2016  . Palpitations 05/15/2013   [facility] manages  . Periodic limb movement sleep disorder 03/28/2017  . Perirectal cyst 05/07/2016  . PONV (postoperative nausea and vomiting)   . Post concussion syndrome 06/06/2015  . Post concussive syndrome 07/14/2015   Ms Frisina's post-concussive syndrome manifesting in vertigo and headache, mood changes, poor balance, dizziness, and decreased concentration per Dr Jaynee Eagles at [facility].    Marland Kitchen Posterior vitreous detachment of right eye 2014  . Shingles   . Shortness of breath dyspnea    with exertion  . Thyroid nodule 08/11/2009   Findings: The thyroid gland is within normal limits in size.  The gland is diffusely inhomogeneous. A small solid nodule is noted in the lower pole  medially on the right of 7 x 6 x 8 mm. A small solid nodule is noted inferiorly on the left of 3 x 3 x 4 mm.  IMPRESSION:  The thyroid gland is within normal limits in size with only small solid nodules present, the largest of only 8 mm in diameter on the right.    . 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   . Vasomotor symptoms due to menopause 04/19/2017    PAST SURGICAL HISTORY: Past Surgical History:  Procedure Laterality Date  . Bladder dilitation     x 3  . BREAST BIOPSY  2011   Benign histology  . CARDIOVASCULAR STRESS TEST  2000   Unremarkable per pt report  . CARPOMETACARPAL JOINT ARTHROTOMY Right 2011  . COLONOSCOPY    . COLONOSCOPY WITH PROPOFOL N/A 04/21/2015   Procedure: COLONOSCOPY WITH PROPOFOL;  Surgeon: Carol Ada, MD;  Location: [facility];  Service: Endoscopy;  Laterality: N/A;  . EPIDURAL BLOCK INJECTION Left 04/12/2016   Left Medial Nerve Block and Left L5 ramus block, Dr Suella Broad   . EPIDURAL BLOCK INJECTION  03/21/2016   Left L3-4 medial branch block and Left L5 & dorsal ramus block   . EPIDURAL BLOCK INJECTION N/A 10/25/2016   Suella Broad, MD. Lumbar medial branch block  . EPIDURAL BLOCK INJECTION N/A 02/09/2017   Suella Broad, MD.  Bilateral L3/4 medial branch block, bilateral L5 dorsal ramus block  . EPIDURAL BLOCK INJECTION N/A 07/04/2017   Suella Broad, MD  . FECAL TRANSPLANT  04/21/2015   Procedure: FECAL TRANSPLANT;  Surgeon: Carol Ada, MD;  Location: [facility];  Service: Endoscopy;;  . HIP ARTHROPLASTY Left   . HIP ARTHROSCOPY Left 03/06/2018   Left hip arthroplasty, redo for loose hip  arthroplasty. Procedure at [facility] hospital  . INJECTION HIP INTRA ARTICULAR Left 11/2015   for OA by Dr Suella Broad  . OTHER SURGICAL HISTORY Left 2016   Left L3/L4 medial nerve block and Left L5 Dorsal Ramus block Dr Mickel Duhamel  . TOTAL HIP ARTHROPLASTY Left 07/25/2016   Procedure: LEFT TOTAL HIP ARTHROPLASTY ANTERIOR APPROACH;  Surgeon: Paralee Cancel, MD;  Location: [facility];  Service: Orthopedics;  Laterality: Left;    SOCIAL HISTORY: Social History   Tobacco Use  . Smoking status: Passive Smoke Exposure - Never Smoker  . Smokeless tobacco: Never Used  . Tobacco comment: As a child  Substance Use Topics  . Alcohol use: No    Alcohol/week: 0.0 standard drinks    Comment: No use in 30 years.  . Drug use: No    FAMILY HISTORY: Family History  Problem Relation Age of Onset  . Alzheimer's disease Mother   . Hyperlipidemia Mother   . Hypertension Mother   . Osteoporosis Mother   . Parkinson's disease Mother   . Migraines Sister   . Allergies Sister   . Hypertension Sister   . Hyperlipidemia Brother   . Cardiomyopathy Brother   . Diabetes type II Brother   . Kidney disease Brother   . Asthma Brother   . Heart disease Brother   . Hypertension Brother   . Hyperlipidemia Brother   . Kidney disease Brother   . Heart disease Father   . Hyperlipidemia Father   . Hypertension Father   . Aortic aneurysm Father   . Early death Father 22  . Diabetes type II Unknown   . Breast cancer Maternal Aunt     ROS: Review of Systems  Constitutional: Negative for malaise/fatigue and weight loss.  Respiratory: Negative for shortness of breath.   Cardiovascular: Negative for chest pain.  Gastrointestinal: Negative for nausea and vomiting.  Musculoskeletal: Positive for myalgias.       Negative for muscle weakness  Endo/Heme/Allergies:       Negative for polyphagia     PHYSICAL EXAM: Blood pressure 93/68, pulse 76, temperature 98.1 F (36.7 C), temperature source Oral, height  5\' 7"  (1.702 m), weight 183 lb (83 kg), SpO2 98 %. Body mass index is 28.66 kg/m. Physical Exam  Constitutional: She is oriented to person, place, and time. She appears well-developed and well-nourished.  Cardiovascular: Normal rate.  Pulmonary/Chest: Effort normal.  Musculoskeletal: Normal range of motion.  Neurological: She is oriented to person, place, and time.  Skin: Skin is warm and dry.  Psychiatric: She has a normal mood and affect. Her behavior is normal.  Vitals reviewed.   RECENT LABS AND TESTS: BMET    Component Value Date/Time   NA 138 05/07/2018 1057   K 4.4 05/07/2018 1057   CL 102 05/07/2018 1057   CO2 22 05/07/2018 1057   GLUCOSE 85 05/07/2018 1057   GLUCOSE 86 03/09/2018 1118   BUN 16 05/07/2018 1057   CREATININE 0.80 05/07/2018 1057   CREATININE 0.74 02/24/2016 1158   CALCIUM 9.3 05/07/2018 1057   GFRNONAA 79 05/07/2018 1057   GFRNONAA 88 02/24/2016 1158   GFRAA 91 05/07/2018 1057   GFRAA >89 02/24/2016 1158   Lab Results  Component Value Date   HGBA1C 5.0 02/07/2018   HGBA1C 5.2 07/04/2017   HGBA1C 5.3 03/22/2017   Lab Results  Component Value Date   INSULIN 8.3 05/07/2018   INSULIN 11.8 07/04/2017   INSULIN 10.5 03/22/2017   CBC    Component Value Date/Time   WBC 6.7 05/07/2018 1057   WBC 6.8 03/09/2018 1118   RBC 4.29 05/07/2018 1057   RBC 3.43 (L) 03/09/2018 1118   HGB 12.6 05/07/2018 1057   HCT 38.8 05/07/2018 1057   PLT 271 03/09/2018 1118   MCV 90 05/07/2018 1057   MCH 29.4 05/07/2018 1057   MCH 30.3 03/09/2018 1118   MCHC 32.5 05/07/2018 1057   MCHC  32.1 03/09/2018 1118   RDW 14.0 05/07/2018 1057   LYMPHSABS 2.5 05/07/2018 1057   MONOABS 0.7 03/09/2018 1118   EOSABS 0.2 05/07/2018 1057   BASOSABS 0.0 05/07/2018 1057   Iron/TIBC/Ferritin/ %Sat No results found for: IRON, TIBC, FERRITIN, IRONPCTSAT Lipid Panel     Component Value Date/Time   CHOL 145 05/07/2018 1057   TRIG 115 05/07/2018 1057   HDL 47 05/07/2018 1057    CHOLHDL 3.9 12/14/2014 1036   VLDL 26 12/14/2014 1036   LDLCALC 75 05/07/2018 1057   Hepatic Function Panel     Component Value Date/Time   PROT 6.7 05/07/2018 1057   ALBUMIN 4.2 05/07/2018 1057   AST 16 05/07/2018 1057   ALT 15 05/07/2018 1057   ALKPHOS 79 05/07/2018 1057   BILITOT 0.2 05/07/2018 1057      Component Value Date/Time   TSH 3.880 07/04/2017 1021   TSH 3.190 03/22/2017 1057   TSH 4.000 01/25/2017 1109   Results for MONYA, [id number] (MRN [id number]) as of 05/08/2018 09:58  Ref. Range 05/07/2018 10:57  Vitamin D, 25-Hydroxy Latest Ref Range: 30.0 - 100.0 ng/mL 43.3   ASSESSMENT AND PLAN: Other hyperlipidemia - Plan: Lipid Panel With LDL/HDL Ratio  Insulin resistance - Plan: Comprehensive metabolic panel, Insulin, random  Other iron deficiency anemia - Plan: CBC With Differential  Vitamin D deficiency - Plan: VITAMIN D 25 Hydroxy (Vit-D Deficiency, Fractures)  At risk for osteoporosis  Class 1 obesity with serious comorbidity and body mass index (BMI) of 30.0 to 30.9 in adult, unspecified obesity type - Starting BMI greater then 30  PLAN:  Hyperlipidemia Carleen was informed of the American Heart Association Guidelines emphasizing intensive lifestyle modifications as the first line treatment for hyperlipidemia. We discussed many lifestyle modifications today in depth, and Rashay will continue to work on decreasing saturated fats such as fatty red meat, butter and many fried foods. She will also increase vegetables and lean protein in her diet and continue to work on exercise and weight loss efforts. I recommended CoQ10 for muscle aches. We will check labs and Kaena will follow up at the agreed upon time.  Insulin Resistance Ren will continue to work on weight loss, exercise, and decreasing simple carbohydrates in her diet to help decrease the risk of diabetes. She was informed that eating too many simple carbohydrates or too many calories at one sitting increases the  likelihood of GI side effects. We will check her insulin level today and Brandey agreed to follow up with [name] as directed to monitor her progress.  Anemia The diagnosis of Iron deficiency anemia was discussed with [name] and was explained in detail. She was given suggestions of iron rich foods and iron supplement was not prescribed. Labs will be rechecked today and Tanica will follow up as directed.  Vitamin D Deficiency Ernesta was informed that low vitamin D levels contributes to fatigue and are associated with obesity, breast, and colon cancer. She agrees to continue to take prescription Vit D @50 ,000 IU every week and will follow up for routine testing of vitamin D, at least 2-3 times per year. She was informed of the risk of over-replacement of vitamin D and agrees to not increase her dose unless she discusses this with [name] first.  At risk for osteopenia and osteoporosis Wilene is at risk for osteopenia and osteoporosis due to her vitamin D deficiency. She was encouraged to take her vitamin D and follow her higher calcium diet and increase strengthening exercise to help  strengthen her bones and decrease her risk of osteopenia and osteoporosis.  Obesity Sephora is currently in the action stage of change. As such, her goal is to continue with weight loss efforts She has agreed to keep a food journal with 400 to 500 calories and 35 grams of protein at supper daily and follow the Category 2 plan Wendelin has been instructed to work up to a goal of 150 minutes of combined cardio and strengthening exercise per week for weight loss and overall health benefits. We discussed the following Behavioral Modification Strategies today: work on meal planning and easy cooking plans and planning for success  Kamber has agreed to follow up with our clinic in 2 to 3 weeks. She was informed of the importance of frequent follow up visits to maximize her success with intensive lifestyle modifications for her multiple health  conditions.   OBESITY BEHAVIORAL INTERVENTION VISIT  Today's visit was # 14   Starting weight: 204 lbs Starting date: 03/22/18 Today's weight : 183 lbs Today's date: 05/07/2018 Total lbs lost to date: 21 At least 15 minutes were spent on discussing the following behavioral intervention visit.   ASK: We discussed the diagnosis of obesity with Gordy Savers today and Allissa agreed to give [name] permission to discuss obesity behavioral modification therapy today.  ASSESS: Aniayah has the diagnosis of obesity and her BMI today is 28.66 Lynnleigh is in the action stage of change   ADVISE: Paula was educated on the multiple health risks of obesity as well as the benefit of weight loss to improve her health. She was advised of the need for long term treatment and the importance of lifestyle modifications to improve her current health and to decrease her risk of future health problems.  AGREE: Multiple dietary modification options and treatment options were discussed and  Trenesha agreed to follow the recommendations documented in the above note.  ARRANGE: Kathleena was educated on the importance of frequent visits to treat obesity as outlined per CMS and USPSTF guidelines and agreed to schedule her next follow up appointment today.  Corey Skains, am acting as transcriptionist for Abby Potash, PA-C I, Abby Potash, PA-C have reviewed above note and agree with its content

## 2018-05-21 ENCOUNTER — Ambulatory Visit (INDEPENDENT_AMBULATORY_CARE_PROVIDER_SITE_OTHER): Payer: BLUE CROSS/BLUE SHIELD | Admitting: Physician Assistant

## 2018-05-21 VITALS — BP 131/82 | HR 78 | Temp 97.8°F | Ht 67.0 in | Wt 183.0 lb

## 2018-05-21 DIAGNOSIS — E66811 Obesity, class 1: Secondary | ICD-10-CM

## 2018-05-21 DIAGNOSIS — E559 Vitamin D deficiency, unspecified: Secondary | ICD-10-CM | POA: Diagnosis not present

## 2018-05-21 DIAGNOSIS — E8881 Metabolic syndrome: Secondary | ICD-10-CM | POA: Diagnosis not present

## 2018-05-21 DIAGNOSIS — Z6830 Body mass index (BMI) 30.0-30.9, adult: Secondary | ICD-10-CM

## 2018-05-21 DIAGNOSIS — E669 Obesity, unspecified: Secondary | ICD-10-CM

## 2018-05-21 DIAGNOSIS — Z9189 Other specified personal risk factors, not elsewhere classified: Secondary | ICD-10-CM | POA: Diagnosis not present

## 2018-05-21 DIAGNOSIS — E88819 Insulin resistance, unspecified: Secondary | ICD-10-CM

## 2018-05-21 MED ORDER — VITAMIN D (ERGOCALCIFEROL) 1.25 MG (50000 UNIT) PO CAPS: 50000.0000 [IU] | ORAL_CAPSULE | ORAL | 0 refills | Status: DC

## 2018-05-21 NOTE — Progress Notes (Signed)
Office: [phone]  /  Fax: [phone]   HPI:   Chief Complaint: OBESITY Molly Giles is here to discuss her progress with her obesity treatment plan. She is on the  keep a food journal with 400 to 500 calories and 35 grams of protein at supper daily and the Category 2 plan and is following her eating plan approximately 60 % of the time. She states she is doing physical therapy exercises 15 to 30 minutes 7 times per week. Molly Giles did very well with maintaining her weight. She is traveling to [place] next week and she would like travel eating strategies. Her weight is 183 lb (83 kg) today and she has maintained weight over a period of 2 weeks since her last visit. She has lost 21 lbs since starting treatment with [name].  Vitamin D deficiency Molly Giles has a diagnosis of vitamin D deficiency. Molly Giles is currently taking vit D and her last level was not at goal. She denies nausea, vomiting or muscle weakness.  At risk for osteopenia and osteoporosis Molly Giles is at higher risk of osteopenia and osteoporosis due to vitamin D deficiency.   Insulin Resistance Molly Giles has a diagnosis of insulin resistance based on her elevated fasting insulin level >5. Although Molly Giles's blood glucose readings are still under good control, insulin resistance puts her at greater risk of metabolic syndrome and diabetes. Her insulin level was much improved with her last labs at 8.3. She is not taking metformin currently and continues to work on diet and exercise to decrease risk of diabetes. Danyka denies polyphagia.  ALLERGIES: Allergies  Allergen Reactions  . Sulfa Antibiotics Itching  . Chlorhexidine Gluconate Other (See Comments) and Rash    Burning  . Codeine Nausea Only    "head nausea"   . Lyrica [Pregabalin] Other (See Comments)    Dizziness, nausea  . Methylprednisolone     Flushing of the face  . Prednisone     Flushing of the face  . Betadine [Povidone Iodine] Rash    burning  . Latex Itching and Rash    burning   . Wellbutrin [Bupropion] Anxiety    MEDICATIONS: Current Outpatient Medications on File Prior to Visit  Medication Sig Dispense Refill  . aspirin EC 325 MG tablet TAKE 1 TABLET BY MOUTH TWO TIMES DAILY FOR 6 WEEKS UNTIL 04/16/18, THEN TAKE 1 TABLET ONCE DAILY FROM 04/17/18 TO 05/29/18  0  . citalopram (CELEXA) 20 MG tablet TAKE 1 TABLET(20 MG) BY MOUTH DAILY 90 tablet 0  . eletriptan (RELPAX) 40 MG tablet Take 1 tablet (40 mg total) by mouth as needed for migraine. may repeat in 2 hours if necessary 10 tablet 0  . Fremanezumab-vfrm (AJOVY) 225 MG/1.5ML SOSY Inject 225 mg into the skin every 30 (thirty) days. 1 Syringe 11  . levothyroxine (SYNTHROID, LEVOTHROID) 125 MCG tablet TAKE 1/2 TABLET BY MOUTH DAILY 90 tablet 3  . loratadine (CLARITIN) 10 MG tablet Take 10 mg by mouth daily.    . methocarbamol (ROBAXIN) 500 MG tablet Take 500 mg by mouth 3 (three) times daily.  2  . prednisoLONE acetate (PRED FORTE) 1 % ophthalmic suspension Place 1 drop into both eyes daily.    Marland Kitchen PREMPRO 0.3-1.5 MG tablet TAKE 1 TABLET BY MOUTH AT BEDTIME 84 tablet 3  . rosuvastatin (CRESTOR) 10 MG tablet TAKE 1 TABLET(10 MG) BY MOUTH DAILY 90 tablet 3  . traMADol (ULTRAM) 50 MG tablet Take 2 tablets (100 mg total) by mouth 2 (two) times daily. 120 tablet  0  . triamcinolone (NASACORT) 55 MCG/ACT AERO nasal inhaler Place into the nose.    . zolpidem (AMBIEN) 10 MG tablet Take 1 tablet (10 mg total) by mouth at bedtime as needed. 30 tablet 2   No current facility-administered medications on file prior to visit.     PAST MEDICAL HISTORY: Past Medical History:  Diagnosis Date  . Allergic rhinoconjunctivitis 07/02/2015  . Arthritis   . Benign positional vertigo 04/2015   Responded well to [facility]  . Bruxism (teeth grinding)   . Chronic migraine 02/25/2016   Per Dr Jaynee Eagles review in notes from [facility] from September 2015. Showed total headache days last month 18. Severe headache days 7  days. Moderate headache days 5 days. Mild headache days last month sick days. Days without headache last month 10 days. Symptoms associated with photophobia, phonophobia, osmophobia, neck pain, dizziness, jaw pain, nasal congestion, vision disturbances, tingling and numbness, weakness and worsening with activity. Each headache attack last 3 hours depending on treatment in severity. Left side, the right side, easier side, the frontal area in the back of the head. Characterized as throbbing, pressure, tightness, squeezing, stabbing and burning   . Chronic migraine w/o aura w/o status migrainosus, not intractable 07/14/2015  . DDD (degenerative disc disease), lumbar   . Degenerative cervical disc   . Depression   . Disc displacement, lumbar   . Dysrhythmia    seen by dr Wynonia Lawman- not a problem since she has been on Bystolic  . Essential hypertension 05/15/2013  . Family history of adverse reaction to anesthesia    Brother- N/V  . Family history of premature CAD 05/15/2013  . Fibromyalgia   . GERD (gastroesophageal reflux disease) 12/16/2014  . H/O seasonal allergies   . Hammer toe    Left great toe  . Hearing loss of both ears 07/27/2015   mild to borderline moderate low frequency hearing loss improving to within normal limits bilaterally on audiology testing at [facility] in November 2016.    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 History of irritable bowel syndrome 02/15/2016  . History of migraine headaches 05/15/2013   Per Dr Jaynee Eagles review in notes from [facility] from September 2015. Showed total headache days last month 18. Severe headache days 7 days. Moderate headache days 5 days. Mild headache days last month sick days. Days without headache last month 10 days. Symptoms associated with photophobia, phonophobia, osmophobia, neck pain, dizziness, jaw pain, nasal congestion, vision disturbances, tingling and numbness,  weakness and worsening with activity. Each headache attack last 3 hours depending on treatment in severity. Left side, the right side, easier side, the frontal area in the back of the head. Characterized as throbbing, pressure, tightness, squeezing, stabbing and burning    . Hx of bad fall 02/2015   Severe Facial/head trauma without fracture  . Hyperlipidemia 1998  . Hypertension 2004  . Hypothyroidism   . Impairment of balance 02/2015   Consequent of postconcussive syndrome  . Interstitial cystitis   . 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[place]) 03/10/2018   [facility]  . Lumbar facet joint pain   . 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 Migraine 1962   Chronic Migraines  . Mood disorder ([place])   . Musculoskeletal neck pain 07/14/2015  . Normal coronary arteries 05/14/2014  . Osteoarthritis of left hip 11/01/2015   MRI order by Dr Alvan Dame (ortho) 10/2015 showed significant arthritis of left  hip joint with cystic changes in femoral head c/w osteoarthritis  . 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 Overweight   . Pain in joint of left shoulder 11/09/2016  . Palpitations 05/15/2013   [facility] manages  . Periodic limb movement sleep disorder 03/28/2017  . Perirectal cyst 05/07/2016  . PONV (postoperative nausea and vomiting)   . Post concussion syndrome 06/06/2015  . Post concussive syndrome 07/14/2015   Ms Molly Giles's post-concussive syndrome manifesting in vertigo and headache, mood changes, poor balance, dizziness, and decreased concentration per Dr Jaynee Eagles at [facility].   Marland Kitchen Posterior vitreous detachment of right eye 2014  . Shingles   . Shortness of breath dyspnea    with exertion  . Thyroid nodule 08/11/2009   Findings: The thyroid gland is within normal limits in size.  The gland is diffusely inhomogeneous. A small solid nodule is noted in the lower pole  medially on the right of 7 x 6 x 8 mm. A small solid nodule is noted inferiorly on  the left of 3 x 3 x 4 mm.  IMPRESSION:  The thyroid gland is within normal limits in size with only small solid nodules present, the largest of only 8 mm in diameter on the right.    . 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   . Vasomotor symptoms due to menopause 04/19/2017    PAST SURGICAL HISTORY: Past Surgical History:  Procedure Laterality Date  . Bladder dilitation     x 3  . BREAST BIOPSY  2011   Benign histology  . CARDIOVASCULAR STRESS TEST  2000   Unremarkable per pt report  . CARPOMETACARPAL JOINT ARTHROTOMY Right 2011  . COLONOSCOPY    . COLONOSCOPY WITH PROPOFOL N/A 04/21/2015   Procedure: COLONOSCOPY WITH PROPOFOL;  Surgeon: Carol Ada, MD;  Location: [place];  Service: Endoscopy;  Laterality: N/A;  . EPIDURAL BLOCK INJECTION Left 04/12/2016   Left Medial Nerve Block and Left L5 ramus block, Dr Suella Broad   . EPIDURAL BLOCK INJECTION  03/21/2016   Left L3-4 medial branch block and Left L5 & dorsal ramus block   . EPIDURAL BLOCK INJECTION N/A 10/25/2016   Suella Broad, MD. Lumbar medial branch block  . EPIDURAL BLOCK INJECTION N/A 02/09/2017   Suella Broad, MD.  Bilateral L3/4 medial branch block, bilateral L5 dorsal ramus block  . EPIDURAL BLOCK INJECTION N/A 07/04/2017   Suella Broad, MD  . FECAL TRANSPLANT  04/21/2015   Procedure: FECAL TRANSPLANT;  Surgeon: Carol Ada, MD;  Location: [place];  Service: Endoscopy;;  . HIP ARTHROPLASTY Left   . HIP ARTHROSCOPY Left 03/06/2018   Left hip arthroplasty, redo for loose hip arthroplasty. Procedure at [facility] hospital  . INJECTION HIP INTRA ARTICULAR Left 11/2015   for OA by Dr Suella Broad  . OTHER SURGICAL HISTORY Left 2016   Left L3/L4 medial nerve block and Left L5 Dorsal Ramus block Dr Mickel Duhamel  . TOTAL HIP ARTHROPLASTY Left 07/25/2016   Procedure: LEFT TOTAL HIP ARTHROPLASTY ANTERIOR APPROACH;  Surgeon: Paralee Cancel, MD;  Location:  [facility];  Service: Orthopedics;  Laterality: Left;    SOCIAL HISTORY: Social History   Tobacco Use  . Smoking status: Passive Smoke Exposure - Never Smoker  . Smokeless tobacco: Never Used  . Tobacco comment: As a child  Substance Use Topics  . Alcohol use: No    Alcohol/week: 0.0 standard drinks  Comment: No use in 30 years.  . Drug use: No    FAMILY HISTORY: Family History  Problem Relation Age of Onset  . Alzheimer's disease Mother   . Hyperlipidemia Mother   . Hypertension Mother   . Osteoporosis Mother   . Parkinson's disease Mother   . Migraines Sister   . Allergies Sister   . Hypertension Sister   . Hyperlipidemia Brother   . Cardiomyopathy Brother   . Diabetes type II Brother   . Kidney disease Brother   . Asthma Brother   . Heart disease Brother   . Hypertension Brother   . Hyperlipidemia Brother   . Kidney disease Brother   . Heart disease Father   . Hyperlipidemia Father   . Hypertension Father   . Aortic aneurysm Father   . Early death Father 68  . Diabetes type II Unknown   . Breast cancer Maternal Aunt     ROS: Review of Systems  Constitutional: Negative for weight loss.  Gastrointestinal: Negative for nausea and vomiting.  Musculoskeletal:       Negative for muscle weakness  Endo/Heme/Allergies:       Negative for polyphagia    PHYSICAL EXAM: Blood pressure 131/82, pulse 78, temperature 97.8 F (36.6 C), temperature source Oral, height 5\' 7"  (1.702 m), weight 183 lb (83 kg), SpO2 99 %. Body mass index is 28.66 kg/m. Physical Exam  Constitutional: She is oriented to person, place, and time. She appears well-developed and well-nourished.  Cardiovascular: Normal rate.  Pulmonary/Chest: Effort normal.  Musculoskeletal: Normal range of motion.  Neurological: She is oriented to person, place, and time.  Skin: Skin is warm and dry.  Psychiatric: She has a normal mood and affect. Her behavior is normal.  Vitals reviewed.   RECENT LABS  AND TESTS: BMET    Component Value Date/Time   NA 138 05/07/2018 1057   K 4.4 05/07/2018 1057   CL 102 05/07/2018 1057   CO2 22 05/07/2018 1057   GLUCOSE 85 05/07/2018 1057   GLUCOSE 86 03/09/2018 1118   BUN 16 05/07/2018 1057   CREATININE 0.80 05/07/2018 1057   CREATININE 0.74 02/24/2016 1158   CALCIUM 9.3 05/07/2018 1057   GFRNONAA 79 05/07/2018 1057   GFRNONAA 88 02/24/2016 1158   GFRAA 91 05/07/2018 1057   GFRAA >89 02/24/2016 1158   Lab Results  Component Value Date   HGBA1C 5.0 02/07/2018   HGBA1C 5.2 07/04/2017   HGBA1C 5.3 03/22/2017   Lab Results  Component Value Date   INSULIN 8.3 05/07/2018   INSULIN 11.8 07/04/2017   INSULIN 10.5 03/22/2017   CBC    Component Value Date/Time   WBC 6.7 05/07/2018 1057   WBC 6.8 03/09/2018 1118   RBC 4.29 05/07/2018 1057   RBC 3.43 (L) 03/09/2018 1118   HGB 12.6 05/07/2018 1057   HCT 38.8 05/07/2018 1057   PLT 271 03/09/2018 1118   MCV 90 05/07/2018 1057   MCH 29.4 05/07/2018 1057   MCH 30.3 03/09/2018 1118   MCHC 32.5 05/07/2018 1057   MCHC 32.1 03/09/2018 1118   RDW 14.0 05/07/2018 1057   LYMPHSABS 2.5 05/07/2018 1057   MONOABS 0.7 03/09/2018 1118   EOSABS 0.2 05/07/2018 1057   BASOSABS 0.0 05/07/2018 1057   Iron/TIBC/Ferritin/ %Sat No results found for: IRON, TIBC, FERRITIN, IRONPCTSAT Lipid Panel     Component Value Date/Time   CHOL 145 05/07/2018 1057   TRIG 115 05/07/2018 1057   HDL 47 05/07/2018 1057   CHOLHDL  3.9 12/14/2014 1036   VLDL 26 12/14/2014 1036   LDLCALC 75 05/07/2018 1057   Hepatic Function Panel     Component Value Date/Time   PROT 6.7 05/07/2018 1057   ALBUMIN 4.2 05/07/2018 1057   AST 16 05/07/2018 1057   ALT 15 05/07/2018 1057   ALKPHOS 79 05/07/2018 1057   BILITOT 0.2 05/07/2018 1057      Component Value Date/Time   TSH 3.880 07/04/2017 1021   TSH 3.190 03/22/2017 1057   TSH 4.000 01/25/2017 1109   Results for SKARLET, LYONS (MRN [id number]) as of 05/21/2018 16:43  Ref.  Range 05/07/2018 10:57  Vitamin D, 25-Hydroxy Latest Ref Range: 30.0 - 100.0 ng/mL 43.3   ASSESSMENT AND PLAN: Vitamin D deficiency - Plan: Vitamin D, Ergocalciferol, (DRISDOL) 50000 units CAPS capsule  Insulin resistance  At risk for osteoporosis  Class 1 obesity with serious comorbidity and body mass index (BMI) of 30.0 to 30.9 in adult, unspecified obesity type - Starting BMI greater then 30  PLAN:  Vitamin D Deficiency Timiko was informed that low vitamin D levels contributes to fatigue and are associated with obesity, breast, and colon cancer. She agrees to continue to take prescription Vit D @50 ,000 IU every week #4 with no refills and will follow up for routine testing of vitamin D, at least 2-3 times per year. She was informed of the risk of over-replacement of vitamin D and agrees to not increase her dose unless she discusses this with [name] first. Taunia agrees to follow up as directed.  At risk for osteopenia and osteoporosis Medrith was given extended  (15 minutes) osteoporosis prevention counseling today. Susann is at risk for osteopenia and osteoporosis due to her vitamin D deficiency. She was encouraged to take her vitamin D and follow her higher calcium diet and increase strengthening exercise to help strengthen her bones and decrease her risk of osteopenia and osteoporosis.  Insulin Resistance Cristen will continue to work on weight loss, exercise, and decreasing simple carbohydrates in her diet to help decrease the risk of diabetes. She was informed that eating too many simple carbohydrates or too many calories at one sitting increases the likelihood of GI side effects. Micaela agreed to follow up with [name] as directed to monitor her progress.  Obesity Noemy is currently in the action stage of change. As such, her goal is to continue with weight loss efforts She has agreed to keep a food journal with 1200 calories and 90 grams of protein daily Coree has been instructed to work up to a  goal of 150 minutes of combined cardio and strengthening exercise per week for weight loss and overall health benefits. We discussed the following Behavioral Modification Strategies today: travel eating strategies and work on meal planning and easy cooking plans  Patrese has agreed to follow up with our clinic in 3 weeks. She was informed of the importance of frequent follow up visits to maximize her success with intensive lifestyle modifications for her multiple health conditions.   OBESITY BEHAVIORAL INTERVENTION VISIT  Today's visit was # 15   Starting weight: 204 lbs Starting date: 03/22/17 Today's weight : 183 lbs  Today's date: 05/21/2018 Total lbs lost to date: 21   ASK: We discussed the diagnosis of obesity with Gordy Savers today and Idona agreed to give [name] permission to discuss obesity behavioral modification therapy today.  ASSESS: [place] has the diagnosis of obesity and her BMI today is 28.66 Alazay is in the action stage of change  ADVISE: Molly Giles was educated on the multiple health risks of obesity as well as the benefit of weight loss to improve her health. She was advised of the need for long term treatment and the importance of lifestyle modifications to improve her current health and to decrease her risk of future health problems.  AGREE: Multiple dietary modification options and treatment options were discussed and  Molly Giles agreed to follow the recommendations documented in the above note.  ARRANGE: Molly Giles was educated on the importance of frequent visits to treat obesity as outlined per CMS and USPSTF guidelines and agreed to schedule her next follow up appointment today.  Corey Skains, am acting as transcriptionist for Abby Potash, PA-C I, Abby Potash, PA-C have reviewed above note and agree with its content

## 2018-05-24 ENCOUNTER — Other Ambulatory Visit: Payer: Self-pay | Admitting: Physician Assistant

## 2018-05-24 DIAGNOSIS — M797 Fibromyalgia: Secondary | ICD-10-CM

## 2018-05-27 NOTE — Telephone Encounter (Signed)
Last Visit: 01/15/18 Next visit: 07/18/18  Okay to refill Ambien?

## 2018-06-11 ENCOUNTER — Ambulatory Visit (INDEPENDENT_AMBULATORY_CARE_PROVIDER_SITE_OTHER): Payer: BLUE CROSS/BLUE SHIELD | Admitting: Physician Assistant

## 2018-06-17 ENCOUNTER — Ambulatory Visit (INDEPENDENT_AMBULATORY_CARE_PROVIDER_SITE_OTHER): Payer: BLUE CROSS/BLUE SHIELD | Admitting: Physician Assistant

## 2018-06-17 ENCOUNTER — Encounter (INDEPENDENT_AMBULATORY_CARE_PROVIDER_SITE_OTHER): Payer: Self-pay | Admitting: Physician Assistant

## 2018-06-17 VITALS — BP 104/74 | HR 80 | Temp 98.4°F | Ht 67.0 in | Wt 188.0 lb

## 2018-06-17 DIAGNOSIS — E8881 Metabolic syndrome: Secondary | ICD-10-CM | POA: Diagnosis not present

## 2018-06-17 DIAGNOSIS — E669 Obesity, unspecified: Secondary | ICD-10-CM

## 2018-06-17 DIAGNOSIS — E559 Vitamin D deficiency, unspecified: Secondary | ICD-10-CM

## 2018-06-17 DIAGNOSIS — E88819 Insulin resistance, unspecified: Secondary | ICD-10-CM

## 2018-06-17 DIAGNOSIS — Z9189 Other specified personal risk factors, not elsewhere classified: Secondary | ICD-10-CM

## 2018-06-17 DIAGNOSIS — Z6830 Body mass index (BMI) 30.0-30.9, adult: Secondary | ICD-10-CM

## 2018-06-17 MED ORDER — VITAMIN D (ERGOCALCIFEROL) 1.25 MG (50000 UNIT) PO CAPS: 50000.0000 [IU] | ORAL_CAPSULE | ORAL | 0 refills | Status: DC

## 2018-06-18 NOTE — Progress Notes (Signed)
Office: [phone]  /  Fax: [phone]   HPI:   Chief Complaint: OBESITY Molly Giles is here to discuss her progress with her obesity treatment plan. She is on the keep a food journal with 1200 calories and 90 grams of protein daily and is following her eating plan approximately 0 % of the time. She states she is walking and stretching for 30 minutes 7 times per week. Molly Giles reports struggling with her plan due to eating out more on vacation. She states that she had dessert every night, which caused more cravings. She is ready to get back on track.  Her weight is 188 lb (85.3 kg) today and has gained 5 pounds since her last visit. She has lost 16 lbs since starting treatment with [name].  Vitamin D Deficiency Molly Giles has a diagnosis of vitamin D deficiency. She is currently taking prescription Vit D and denies nausea, vomiting or muscle weakness.  At risk for osteopenia and osteoporosis Molly Giles is at higher risk of osteopenia and osteoporosis due to vitamin D deficiency.   Insulin Resistance Molly Giles has a diagnosis of insulin resistance based on her elevated fasting insulin level >5. Although Molly Giles blood glucose readings are still under good control, insulin resistance puts her at greater risk of metabolic syndrome and diabetes. She is not taking metformin currently, she denies polyphagia and continues to work on diet and exercise to decrease risk of diabetes.  ALLERGIES: Allergies  Allergen Reactions  . Sulfa Antibiotics Itching  . Chlorhexidine Gluconate Other (See Comments) and Rash    Burning  . Codeine Nausea Only    "head nausea"   . Lyrica [Pregabalin] Other (See Comments)    Dizziness, nausea  . Methylprednisolone     Flushing of the face  . Prednisone     Flushing of the face  . Betadine [Povidone Iodine] Rash    burning  . Latex Itching and Rash    burning  . Wellbutrin [Bupropion] Anxiety    MEDICATIONS: Current Outpatient Medications on File Prior to Visit  Medication  Sig Dispense Refill  . citalopram (CELEXA) 20 MG tablet TAKE 1 TABLET(20 MG) BY MOUTH DAILY 90 tablet 0  . eletriptan (RELPAX) 40 MG tablet Take 1 tablet (40 mg total) by mouth as needed for migraine. may repeat in 2 hours if necessary 10 tablet 0  . Fremanezumab-vfrm (AJOVY) 225 MG/1.5ML SOSY Inject 225 mg into the skin every 30 (thirty) days. 1 Syringe 11  . levothyroxine (SYNTHROID, LEVOTHROID) 125 MCG tablet TAKE 1/2 TABLET BY MOUTH DAILY 90 tablet 3  . loratadine (CLARITIN) 10 MG tablet Take 10 mg by mouth daily.    . methocarbamol (ROBAXIN) 500 MG tablet Take 500 mg by mouth 3 (three) times daily.  2  . prednisoLONE acetate (PRED FORTE) 1 % ophthalmic suspension Place 1 drop into both eyes daily.    Marland Kitchen PREMPRO 0.3-1.5 MG tablet TAKE 1 TABLET BY MOUTH AT BEDTIME 84 tablet 3  . rosuvastatin (CRESTOR) 10 MG tablet TAKE 1 TABLET(10 MG) BY MOUTH DAILY 90 tablet 3  . traMADol (ULTRAM) 50 MG tablet Take 2 tablets (100 mg total) by mouth 2 (two) times daily. 120 tablet 0  . triamcinolone (NASACORT) 55 MCG/ACT AERO nasal inhaler Place into the nose.    . zolpidem (AMBIEN) 10 MG tablet TAKE 1 TABLET(10 MG) BY MOUTH AT BEDTIME AS NEEDED 30 tablet 0   No current facility-administered medications on file prior to visit.     PAST MEDICAL HISTORY: Past Medical History:  Diagnosis Date  . Allergic rhinoconjunctivitis 07/02/2015  . Arthritis   . Benign positional vertigo 04/2015   Responded well to [facility]  . Bruxism (teeth grinding)   . Chronic migraine 02/25/2016   Per Dr Jaynee Eagles review in notes from [facility] from September 2015. Showed total headache days last month 18. Severe headache days 7 days. Moderate headache days 5 days. Mild headache days last month sick days. Days without headache last month 10 days. Symptoms associated with photophobia, phonophobia, osmophobia, neck pain, dizziness, jaw pain, nasal congestion, vision disturbances, tingling and numbness, weakness  and worsening with activity. Each headache attack last 3 hours depending on treatment in severity. Left side, the right side, easier side, the frontal area in the back of the head. Characterized as throbbing, pressure, tightness, squeezing, stabbing and burning   . Chronic migraine w/o aura w/o status migrainosus, not intractable 07/14/2015  . DDD (degenerative disc disease), lumbar   . Degenerative cervical disc   . Depression   . Disc displacement, lumbar   . Dysrhythmia    seen by dr Wynonia Lawman- not a problem since she has been on Bystolic  . Essential hypertension 05/15/2013  . Family history of adverse reaction to anesthesia    Brother- N/V  . Family history of premature CAD 05/15/2013  . Fibromyalgia   . GERD (gastroesophageal reflux disease) 12/16/2014  . H/O seasonal allergies   . Hammer toe    Left great toe  . Hearing loss of both ears 07/27/2015   mild to borderline moderate low frequency hearing loss improving to within normal limits bilaterally on audiology testing at [facility] in November 2016.    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 History of irritable bowel syndrome 02/15/2016  . History of migraine headaches 05/15/2013   Per Dr Jaynee Eagles review in notes from [facility] from September 2015. Showed total headache days last month 18. Severe headache days 7 days. Moderate headache days 5 days. Mild headache days last month sick days. Days without headache last month 10 days. Symptoms associated with photophobia, phonophobia, osmophobia, neck pain, dizziness, jaw pain, nasal congestion, vision disturbances, tingling and numbness, weakness and worsening with activity. Each headache attack last 3 hours depending on treatment in severity. Left side, the right side, easier side, the frontal area in the back of the head. Characterized as throbbing, pressure, tightness, squeezing, stabbing and burning    . Hx of bad  fall 02/2015   Severe Facial/head trauma without fracture  . Hyperlipidemia 1998  . Hypertension 2004  . Hypothyroidism   . Impairment of balance 02/2015   Consequent of postconcussive syndrome  . Interstitial cystitis   . 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[place]) 03/10/2018   [facility]  . Lumbar facet joint pain   . 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 Migraine 1962   Chronic Migraines  . Mood disorder ([place])   . Musculoskeletal neck pain 07/14/2015  . Normal coronary arteries 05/14/2014  . Osteoarthritis of left hip 11/01/2015   MRI order by Dr Alvan Dame (ortho) 10/2015 showed significant arthritis of left hip joint with cystic changes in femoral head c/w osteoarthritis  . 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 Overweight   . Pain in joint of left shoulder 11/09/2016  . Palpitations 05/15/2013   [facility] manages  . Periodic limb movement sleep disorder 03/28/2017  . Perirectal cyst 05/07/2016  . PONV (  postoperative nausea and vomiting)   . Post concussion syndrome 06/06/2015  . Post concussive syndrome 07/14/2015   Ms Laforte's post-concussive syndrome manifesting in vertigo and headache, mood changes, poor balance, dizziness, and decreased concentration per Dr Jaynee Eagles at [facility].   Marland Kitchen Posterior vitreous detachment of right eye 2014  . Shingles   . Shortness of breath dyspnea    with exertion  . Thyroid nodule 08/11/2009   Findings: The thyroid gland is within normal limits in size.  The gland is diffusely inhomogeneous. A small solid nodule is noted in the lower pole  medially on the right of 7 x 6 x 8 mm. A small solid nodule is noted inferiorly on the left of 3 x 3 x 4 mm.  IMPRESSION:  The thyroid gland is within normal limits in size with only small solid nodules present, the largest of only 8 mm in diameter on the right.    . 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    . Vasomotor symptoms due to menopause 04/19/2017    PAST SURGICAL HISTORY: Past Surgical History:  Procedure Laterality Date  . Bladder dilitation     x 3  . BREAST BIOPSY  2011   Benign histology  . CARDIOVASCULAR STRESS TEST  2000   Unremarkable per pt report  . CARPOMETACARPAL JOINT ARTHROTOMY Right 2011  . COLONOSCOPY    . COLONOSCOPY WITH PROPOFOL N/A 04/21/2015   Procedure: COLONOSCOPY WITH PROPOFOL;  Surgeon: Carol Ada, MD;  Location: [place];  Service: Endoscopy;  Laterality: N/A;  . EPIDURAL BLOCK INJECTION Left 04/12/2016   Left Medial Nerve Block and Left L5 ramus block, Dr Suella Broad   . EPIDURAL BLOCK INJECTION  03/21/2016   Left L3-4 medial branch block and Left L5 & dorsal ramus block   . EPIDURAL BLOCK INJECTION N/A 10/25/2016   Suella Broad, MD. Lumbar medial branch block  . EPIDURAL BLOCK INJECTION N/A 02/09/2017   Suella Broad, MD.  Bilateral L3/4 medial branch block, bilateral L5 dorsal ramus block  . EPIDURAL BLOCK INJECTION N/A 07/04/2017   Suella Broad, MD  . FECAL TRANSPLANT  04/21/2015   Procedure: FECAL TRANSPLANT;  Surgeon: Carol Ada, MD;  Location: [place];  Service: Endoscopy;;  . HIP ARTHROPLASTY Left   . HIP ARTHROSCOPY Left 03/06/2018   Left hip arthroplasty, redo for loose hip arthroplasty. Procedure at [facility]  . INJECTION HIP INTRA ARTICULAR Left 11/2015   for OA by Dr Suella Broad  . OTHER SURGICAL HISTORY Left 2016   Left L3/L4 medial nerve block and Left L5 Dorsal Ramus block Dr Mickel Duhamel  . TOTAL HIP ARTHROPLASTY Left 07/25/2016   Procedure: LEFT TOTAL HIP ARTHROPLASTY ANTERIOR APPROACH;  Surgeon: Paralee Cancel, MD;  Location: [facility];  Service: Orthopedics;  Laterality: Left;    SOCIAL HISTORY: Social History   Tobacco Use  . Smoking status: Passive Smoke Exposure - Never Smoker  . Smokeless tobacco: Never Used  . Tobacco comment: As a child  Substance Use Topics  . Alcohol use: No    Alcohol/week:  0.0 standard drinks    Comment: No use in 30 years.  . Drug use: No    FAMILY HISTORY: Family History  Problem Relation Age of Onset  . Alzheimer's disease Mother   . Hyperlipidemia Mother   . Hypertension Mother   . Osteoporosis Mother   . Parkinson's disease Mother   . Migraines Sister   .  Allergies Sister   . Hypertension Sister   . Hyperlipidemia Brother   . Cardiomyopathy Brother   . Diabetes type II Brother   . Kidney disease Brother   . Asthma Brother   . Heart disease Brother   . Hypertension Brother   . Hyperlipidemia Brother   . Kidney disease Brother   . Heart disease Father   . Hyperlipidemia Father   . Hypertension Father   . Aortic aneurysm Father   . Early death Father 49  . Diabetes type II Unknown   . Breast cancer Maternal Aunt     ROS: Review of Systems  Constitutional: Negative for weight loss.  Gastrointestinal: Negative for nausea and vomiting.  Musculoskeletal:       Negative muscle weakness  Endo/Heme/Allergies:       Negative polyphagia    PHYSICAL EXAM: Blood pressure 104/74, pulse 80, temperature 98.4 F (36.9 C), temperature source Oral, height 5\' 7"  (1.702 m), weight 188 lb (85.3 kg), SpO2 98 %. Body mass index is 29.44 kg/m. Physical Exam  Constitutional: She is oriented to person, place, and time. She appears well-developed and well-nourished.  Cardiovascular: Normal rate.  Pulmonary/Chest: Effort normal.  Musculoskeletal: Normal range of motion.  Neurological: She is oriented to person, place, and time.  Skin: Skin is warm and dry.  Psychiatric: She has a normal mood and affect. Her behavior is normal.  Vitals reviewed.   RECENT LABS AND TESTS: BMET    Component Value Date/Time   NA 138 05/07/2018 1057   K 4.4 05/07/2018 1057   CL 102 05/07/2018 1057   CO2 22 05/07/2018 1057   GLUCOSE 85 05/07/2018 1057   GLUCOSE 86 03/09/2018 1118   BUN 16 05/07/2018 1057   CREATININE 0.80 05/07/2018 1057   CREATININE 0.74  02/24/2016 1158   CALCIUM 9.3 05/07/2018 1057   GFRNONAA 79 05/07/2018 1057   GFRNONAA 88 02/24/2016 1158   GFRAA 91 05/07/2018 1057   GFRAA >89 02/24/2016 1158   Lab Results  Component Value Date   HGBA1C 5.0 02/07/2018   HGBA1C 5.2 07/04/2017   HGBA1C 5.3 03/22/2017   Lab Results  Component Value Date   INSULIN 8.3 05/07/2018   INSULIN 11.8 07/04/2017   INSULIN 10.5 03/22/2017   CBC    Component Value Date/Time   WBC 6.7 05/07/2018 1057   WBC 6.8 03/09/2018 1118   RBC 4.29 05/07/2018 1057   RBC 3.43 (L) 03/09/2018 1118   HGB 12.6 05/07/2018 1057   HCT 38.8 05/07/2018 1057   PLT 271 03/09/2018 1118   MCV 90 05/07/2018 1057   MCH 29.4 05/07/2018 1057   MCH 30.3 03/09/2018 1118   MCHC 32.5 05/07/2018 1057   MCHC 32.1 03/09/2018 1118   RDW 14.0 05/07/2018 1057   LYMPHSABS 2.5 05/07/2018 1057   MONOABS 0.7 03/09/2018 1118   EOSABS 0.2 05/07/2018 1057   BASOSABS 0.0 05/07/2018 1057   Iron/TIBC/Ferritin/ %Sat No results found for: IRON, TIBC, FERRITIN, IRONPCTSAT Lipid Panel     Component Value Date/Time   CHOL 145 05/07/2018 1057   TRIG 115 05/07/2018 1057   HDL 47 05/07/2018 1057   CHOLHDL 3.9 12/14/2014 1036   VLDL 26 12/14/2014 1036   LDLCALC 75 05/07/2018 1057   Hepatic Function Panel     Component Value Date/Time   PROT 6.7 05/07/2018 1057   ALBUMIN 4.2 05/07/2018 1057   AST 16 05/07/2018 1057   ALT 15 05/07/2018 1057   ALKPHOS 79 05/07/2018 1057   BILITOT  0.2 05/07/2018 1057      Component Value Date/Time   TSH 3.880 07/04/2017 1021   TSH 3.190 03/22/2017 1057   TSH 4.000 01/25/2017 1109  Results for SHARESA, KEMP (MRN [id number]) as of 06/18/2018 15:21  Ref. Range 05/07/2018 10:57  Vitamin D, 25-Hydroxy Latest Ref Range: 30.0 - 100.0 ng/mL 43.3    ASSESSMENT AND PLAN: Vitamin D deficiency - Plan: Vitamin D, Ergocalciferol, (DRISDOL) 50000 units CAPS capsule  Insulin resistance  At risk for osteoporosis  Class 1 obesity with serious  comorbidity and body mass index (BMI) of 30.0 to 30.9 in adult, unspecified obesity type - Starting BMI greater then 30  PLAN:  Vitamin D Deficiency Molly Giles was informed that low vitamin D levels contributes to fatigue and are associated with obesity, breast, and colon cancer. Molly Giles agrees to continue taking prescription Vit D @50 ,000 IU every week #4 and we will refill for 1 month. She will follow up for routine testing of vitamin D, at least 2-3 times per year. She was informed of the risk of over-replacement of vitamin D and agrees to not increase her dose unless she discusses this with [name] first. Molly Giles agrees to follow up with our clinic in 2 to 3 weeks.  At risk for osteopenia and osteoporosis Molly Giles was given extended (15 minutes) osteoporosis prevention counseling today. Molly Giles is at risk for osteopenia and osteoporsis due to her vitamin D deficiency. She was encouraged to take her vitamin D and follow her higher calcium diet and increase strengthening exercise to help strengthen her bones and decrease her risk of osteopenia and osteoporosis.  Insulin Resistance Molly Giles will continue to work on weight loss, diet, exercise, and decreasing simple carbohydrates in her diet to help decrease the risk of diabetes. We dicussed metformin including benefits and risks. She was informed that eating too many simple carbohydrates or too many calories at one sitting increases the likelihood of GI side effects. Molly Giles declined metformin for now and prescription was not written today. Molly Giles agrees to follow up with our clinic in 2 to 3 weeks as directed to monitor her progress.  Obesity Molly Giles is currently in the action stage of change. As such, her goal is to continue with weight loss efforts She has agreed to keep a food journal with 1200 calories and 90 grams of protein daily Molly Giles has been instructed to work up to a goal of 150 minutes of combined cardio and strengthening exercise per week for weight loss and  overall health benefits. We discussed the following Behavioral Modification Strategies today: increasing lean protein intake, decreasing simple carbohydrates  and work on meal planning and easy cooking plans   Molly Giles has agreed to follow up with our clinic in 2 to 3 weeks. She was informed of the importance of frequent follow up visits to maximize her success with intensive lifestyle modifications for her multiple health conditions.   OBESITY BEHAVIORAL INTERVENTION VISIT  Today's visit was # 16   Starting weight: 204 lbs Starting date: 03/22/17 Today's weight : 188 lbs  Today's date: 06/17/2018 Total lbs lost to date: 16    ASK: We discussed the diagnosis of obesity with Molly Giles today and Aliya agreed to give [name] permission to discuss obesity behavioral modification therapy today.  ASSESS: Molly Giles has the diagnosis of obesity and her BMI today is 29.44 Molly Giles is in the action stage of change   ADVISE: Molly Giles was educated on the multiple health risks of obesity as well as the benefit  of weight loss to improve her health. She was advised of the need for long term treatment and the importance of lifestyle modifications.  AGREE: Multiple dietary modification options and treatment options were discussed and  Molly Giles agreed to the above obesity treatment plan.  Molly Giles, am acting as transcriptionist for Abby Potash, PA-C I, Abby Potash, PA-C have reviewed above note and agree with its content

## 2018-06-20 DIAGNOSIS — M1712 Unilateral primary osteoarthritis, left knee: Secondary | ICD-10-CM

## 2018-06-20 HISTORY — DX: Unilateral primary osteoarthritis, left knee: M17.12

## 2018-06-22 ENCOUNTER — Other Ambulatory Visit: Payer: Self-pay | Admitting: Physician Assistant

## 2018-06-22 DIAGNOSIS — F3289 Other specified depressive episodes: Secondary | ICD-10-CM

## 2018-06-24 NOTE — Telephone Encounter (Signed)
Last Visit: 01/15/18 Next visit: 07/18/18  Okay to refill per Dr. Estanislado Pandy

## 2018-07-01 ENCOUNTER — Ambulatory Visit (INDEPENDENT_AMBULATORY_CARE_PROVIDER_SITE_OTHER): Payer: BLUE CROSS/BLUE SHIELD | Admitting: Physician Assistant

## 2018-07-01 VITALS — BP 110/79 | HR 96 | Temp 98.0°F | Ht 67.0 in | Wt 189.0 lb

## 2018-07-01 DIAGNOSIS — E669 Obesity, unspecified: Secondary | ICD-10-CM | POA: Diagnosis not present

## 2018-07-01 DIAGNOSIS — Z6830 Body mass index (BMI) 30.0-30.9, adult: Secondary | ICD-10-CM | POA: Diagnosis not present

## 2018-07-01 DIAGNOSIS — E559 Vitamin D deficiency, unspecified: Secondary | ICD-10-CM

## 2018-07-03 NOTE — Progress Notes (Signed)
Office: [phone]  /  Fax: [phone]   HPI:   Chief Complaint: OBESITY Molly Giles is here to discuss her progress with her obesity treatment plan. She is keeping a food journal of 1200 calories and 90 grams of protein and is following her eating plan approximately 10 days in the past 2 weeks. She states she is walking and doing Physical Therapy 15 minutes 5 times per week. Chaniqua has gotten off track with her plan since going on vacation. She is craving more simple carbs and wanting an evening snack nightly. She is traveling to the mountains this week and is ready to get back on track.  Her weight is 189 lb (85.7 kg) today and has had a weight gain of 1 pound over a period of 2 weeks since her last visit. She has lost 15 lbs since starting treatment with [name].  Vitamin D deficiency Molly Giles has a diagnosis of vitamin D deficiency. She is currently taking vit D and denies nausea, vomiting or muscle weakness.  ALLERGIES: Allergies  Allergen Reactions  . Sulfa Antibiotics Itching  . Chlorhexidine Gluconate Other (See Comments) and Rash    Burning  . Codeine Nausea Only    "head nausea"   . Lyrica [Pregabalin] Other (See Comments)    Dizziness, nausea  . Methylprednisolone     Flushing of the face  . Prednisone     Flushing of the face  . Betadine [Povidone Iodine] Rash    burning  . Latex Itching and Rash    burning  . Wellbutrin [Bupropion] Anxiety    MEDICATIONS: Current Outpatient Medications on File Prior to Visit  Medication Sig Dispense Refill  . citalopram (CELEXA) 20 MG tablet TAKE 1 TABLET(20 MG) BY MOUTH DAILY 90 tablet 0  . eletriptan (RELPAX) 40 MG tablet Take 1 tablet (40 mg total) by mouth as needed for migraine. may repeat in 2 hours if necessary 10 tablet 0  . Fremanezumab-vfrm (AJOVY) 225 MG/1.5ML SOSY Inject 225 mg into the skin every 30 (thirty) days. 1 Syringe 11  . levothyroxine (SYNTHROID, LEVOTHROID) 125 MCG tablet TAKE 1/2 TABLET BY MOUTH DAILY 90 tablet 3   . loratadine (CLARITIN) 10 MG tablet Take 10 mg by mouth daily.    . methocarbamol (ROBAXIN) 500 MG tablet Take 500 mg by mouth 3 (three) times daily.  2  . prednisoLONE acetate (PRED FORTE) 1 % ophthalmic suspension Place 1 drop into both eyes daily.    Molly Giles PREMPRO 0.3-1.5 MG tablet TAKE 1 TABLET BY MOUTH AT BEDTIME 84 tablet 3  . rosuvastatin (CRESTOR) 10 MG tablet TAKE 1 TABLET(10 MG) BY MOUTH DAILY 90 tablet 3  . traMADol (ULTRAM) 50 MG tablet Take 2 tablets (100 mg total) by mouth 2 (two) times daily. 120 tablet 0  . triamcinolone (NASACORT) 55 MCG/ACT AERO nasal inhaler Place into the nose.    . Vitamin D, Ergocalciferol, (DRISDOL) 50000 units CAPS capsule Take 1 capsule (50,000 Units total) by mouth every 7 (seven) days. 4 capsule 0  . zolpidem (AMBIEN) 10 MG tablet TAKE 1 TABLET(10 MG) BY MOUTH AT BEDTIME AS NEEDED 30 tablet 0   No current facility-administered medications on file prior to visit.     PAST MEDICAL HISTORY: Past Medical History:  Diagnosis Date  . Allergic rhinoconjunctivitis 07/02/2015  . Arthritis   . Benign positional vertigo 04/2015   Responded well to Vestibular Rehab  . Bruxism (teeth grinding)   . Chronic migraine 02/25/2016   Per Dr Jaynee Eagles review in  notes from [facility] from September 2015. Showed total headache days last month 18. Severe headache days 7 days. Moderate headache days 5 days. Mild headache days last month sick days. Days without headache last month 10 days. Symptoms associated with photophobia, phonophobia, osmophobia, neck pain, dizziness, jaw pain, nasal congestion, vision disturbances, tingling and numbness, weakness and worsening with activity. Each headache attack last 3 hours depending on treatment in severity. Left side, the right side, easier side, the frontal area in the back of the head. Characterized as throbbing, pressure, tightness, squeezing, stabbing and burning   . Chronic migraine w/o aura w/o status migrainosus,  not intractable 07/14/2015  . DDD (degenerative disc disease), lumbar   . Degenerative cervical disc   . Depression   . Disc displacement, lumbar   . Dysrhythmia    seen by dr Wynonia Lawman- not a problem since she has been on Bystolic  . Essential hypertension 05/15/2013  . Family history of adverse reaction to anesthesia    Brother- N/V  . Family history of premature CAD 05/15/2013  . Fibromyalgia   . GERD (gastroesophageal reflux disease) 12/16/2014  . H/O seasonal allergies   . Hammer toe    Left great toe  . Hearing loss of both ears 07/27/2015   mild to borderline moderate low frequency hearing loss improving to within normal limits bilaterally on audiology testing at [facility] in November 2016.    Molly Giles History of Clostridium difficile colitis 07/01/2015   Required Fecal Transplantation tocure  . History of irritable bowel syndrome 02/15/2016  . History of migraine headaches 05/15/2013   Per Dr Jaynee Eagles review in notes from [facility] from September 2015. Showed total headache days last month 18. Severe headache days 7 days. Moderate headache days 5 days. Mild headache days last month sick days. Days without headache last month 10 days. Symptoms associated with photophobia, phonophobia, osmophobia, neck pain, dizziness, jaw pain, nasal congestion, vision disturbances, tingling and numbness, weakness and worsening with activity. Each headache attack last 3 hours depending on treatment in severity. Left side, the right side, easier side, the frontal area in the back of the head. Characterized as throbbing, pressure, tightness, squeezing, stabbing and burning    . Hx of bad fall 02/2015   Severe Facial/head trauma without fracture  . Hyperlipidemia 1998  . Hypertension 2004  . Hypothyroidism   . Impairment of balance 02/2015   Consequent of postconcussive syndrome  . Interstitial cystitis   . 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[place]) 03/10/2018   [facility]  . Lumbar facet joint  pain   . 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 Migraine 1962   Chronic Migraines  . Mood disorder ([place])   . Musculoskeletal neck pain 07/14/2015  . Normal coronary arteries 05/14/2014  . Osteoarthritis of left hip 11/01/2015   MRI order by Dr Alvan Dame (ortho) 10/2015 showed significant arthritis of left hip joint with cystic changes in femoral head c/w osteoarthritis  . 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 Overweight   . Pain in joint of left shoulder 11/09/2016  . Palpitations 05/15/2013   [facility] manages  . Periodic limb movement sleep disorder 03/28/2017  . Perirectal cyst 05/07/2016  . PONV (postoperative nausea and vomiting)   . Post concussion syndrome 06/06/2015  . Post concussive syndrome 07/14/2015   Ms Daversa's post-concussive syndrome manifesting in vertigo and headache, mood changes, poor balance, dizziness, and decreased concentration per Dr Jaynee Eagles at [facility].   Molly Giles  Posterior vitreous detachment of right eye 2014  . Shingles   . Shortness of breath dyspnea    with exertion  . Thyroid nodule 08/11/2009   Findings: The thyroid gland is within normal limits in size.  The gland is diffusely inhomogeneous. A small solid nodule is noted in the lower pole  medially on the right of 7 x 6 x 8 mm. A small solid nodule is noted inferiorly on the left of 3 x 3 x 4 mm.  IMPRESSION:  The thyroid gland is within normal limits in size with only small solid nodules present, the largest of only 8 mm in diameter on the right.    . 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   . Vasomotor symptoms due to menopause 04/19/2017    PAST SURGICAL HISTORY: Past Surgical History:  Procedure Laterality Date  . Bladder dilitation     x 3  . BREAST BIOPSY  2011   Benign histology  . CARDIOVASCULAR STRESS TEST  2000   Unremarkable per pt report  . CARPOMETACARPAL JOINT ARTHROTOMY  Right 2011  . COLONOSCOPY    . COLONOSCOPY WITH PROPOFOL N/A 04/21/2015   Procedure: COLONOSCOPY WITH PROPOFOL;  Surgeon: Carol Ada, MD;  Location: [place];  Service: Endoscopy;  Laterality: N/A;  . EPIDURAL BLOCK INJECTION Left 04/12/2016   Left Medial Nerve Block and Left L5 ramus block, Dr Suella Broad   . EPIDURAL BLOCK INJECTION  03/21/2016   Left L3-4 medial branch block and Left L5 & dorsal ramus block   . EPIDURAL BLOCK INJECTION N/A 10/25/2016   Suella Broad, MD. Lumbar medial branch block  . EPIDURAL BLOCK INJECTION N/A 02/09/2017   Suella Broad, MD.  Bilateral L3/4 medial branch block, bilateral L5 dorsal ramus block  . EPIDURAL BLOCK INJECTION N/A 07/04/2017   Suella Broad, MD  . FECAL TRANSPLANT  04/21/2015   Procedure: FECAL TRANSPLANT;  Surgeon: Carol Ada, MD;  Location: [place];  Service: Endoscopy;;  . HIP ARTHROPLASTY Left   . HIP ARTHROSCOPY Left 03/06/2018   Left hip arthroplasty, redo for loose hip arthroplasty. Procedure [facility]  . INJECTION HIP INTRA ARTICULAR Left 11/2015   for OA by Dr Suella Broad  . OTHER SURGICAL HISTORY Left 2016   Left L3/L4 medial nerve block and Left L5 Dorsal Ramus block Dr Mickel Duhamel  . TOTAL HIP ARTHROPLASTY Left 07/25/2016   Procedure: LEFT TOTAL HIP ARTHROPLASTY ANTERIOR APPROACH;  Surgeon: Paralee Cancel, MD;  Location: [facility];  Service: Orthopedics;  Laterality: Left;    SOCIAL HISTORY: Social History   Tobacco Use  . Smoking status: Passive Smoke Exposure - Never Smoker  . Smokeless tobacco: Never Used  . Tobacco comment: As a child  Substance Use Topics  . Alcohol use: No    Alcohol/week: 0.0 standard drinks    Comment: No use in 30 years.  . Drug use: No    FAMILY HISTORY: Family History  Problem Relation Age of Onset  . Alzheimer's disease Mother   . Hyperlipidemia Mother   . Hypertension Mother   . Osteoporosis Mother   . Parkinson's disease Mother   . Migraines Sister   .  Allergies Sister   . Hypertension Sister   . Hyperlipidemia Brother   . Cardiomyopathy Brother   . Diabetes type II Brother   . Kidney disease Brother   . Asthma Brother   . Heart disease Brother   .  Hypertension Brother   . Hyperlipidemia Brother   . Kidney disease Brother   . Heart disease Father   . Hyperlipidemia Father   . Hypertension Father   . Aortic aneurysm Father   . Early death Father 12  . Diabetes type II Unknown   . Breast cancer Maternal Aunt     ROS: Review of Systems  Constitutional: Negative for weight loss.  Gastrointestinal: Negative for nausea and vomiting.  Musculoskeletal:       Negative for muscle weakness.    PHYSICAL EXAM: Blood pressure 110/79, pulse 96, temperature 98 F (36.7 C), temperature source Oral, height 5\' 7"  (1.702 m), weight 189 lb (85.7 kg), SpO2 97 %. Body mass index is 29.6 kg/m. Physical Exam  Constitutional: She is oriented to person, place, and time. She appears well-developed and well-nourished.  Cardiovascular: Normal rate.  Pulmonary/Chest: Effort normal.  Musculoskeletal: Normal range of motion.  Neurological: She is oriented to person, place, and time.  Skin: Skin is warm and dry.  Psychiatric: She has a normal mood and affect. Her behavior is normal.  Vitals reviewed.   RECENT LABS AND TESTS: BMET    Component Value Date/Time   NA 138 05/07/2018 1057   K 4.4 05/07/2018 1057   CL 102 05/07/2018 1057   CO2 22 05/07/2018 1057   GLUCOSE 85 05/07/2018 1057   GLUCOSE 86 03/09/2018 1118   BUN 16 05/07/2018 1057   CREATININE 0.80 05/07/2018 1057   CREATININE 0.74 02/24/2016 1158   CALCIUM 9.3 05/07/2018 1057   GFRNONAA 79 05/07/2018 1057   GFRNONAA 88 02/24/2016 1158   GFRAA 91 05/07/2018 1057   GFRAA >89 02/24/2016 1158   Lab Results  Component Value Date   HGBA1C 5.0 02/07/2018   HGBA1C 5.2 07/04/2017   HGBA1C 5.3 03/22/2017   Lab Results  Component Value Date   INSULIN 8.3 05/07/2018   INSULIN 11.8  07/04/2017   INSULIN 10.5 03/22/2017   CBC    Component Value Date/Time   WBC 6.7 05/07/2018 1057   WBC 6.8 03/09/2018 1118   RBC 4.29 05/07/2018 1057   RBC 3.43 (L) 03/09/2018 1118   HGB 12.6 05/07/2018 1057   HCT 38.8 05/07/2018 1057   PLT 271 03/09/2018 1118   MCV 90 05/07/2018 1057   MCH 29.4 05/07/2018 1057   MCH 30.3 03/09/2018 1118   MCHC 32.5 05/07/2018 1057   MCHC 32.1 03/09/2018 1118   RDW 14.0 05/07/2018 1057   LYMPHSABS 2.5 05/07/2018 1057   MONOABS 0.7 03/09/2018 1118   EOSABS 0.2 05/07/2018 1057   BASOSABS 0.0 05/07/2018 1057   Iron/TIBC/Ferritin/ %Sat No results found for: IRON, TIBC, FERRITIN, IRONPCTSAT Lipid Panel     Component Value Date/Time   CHOL 145 05/07/2018 1057   TRIG 115 05/07/2018 1057   HDL 47 05/07/2018 1057   CHOLHDL 3.9 12/14/2014 1036   VLDL 26 12/14/2014 1036   LDLCALC 75 05/07/2018 1057   Hepatic Function Panel     Component Value Date/Time   PROT 6.7 05/07/2018 1057   ALBUMIN 4.2 05/07/2018 1057   AST 16 05/07/2018 1057   ALT 15 05/07/2018 1057   ALKPHOS 79 05/07/2018 1057   BILITOT 0.2 05/07/2018 1057      Component Value Date/Time   TSH 3.880 07/04/2017 1021   TSH 3.190 03/22/2017 1057   TSH 4.000 01/25/2017 1109   Results for ROBEN, TATSCH (MRN [id number]) as of 07/03/2018 18:18  Ref. Range 05/07/2018 10:57  Vitamin D, 25-Hydroxy Latest Ref  Range: 30.0 - 100.0 ng/mL 43.3   ASSESSMENT AND PLAN: Vitamin D deficiency  Class 1 obesity with serious comorbidity and body mass index (BMI) of 30.0 to 30.9 in adult, unspecified obesity type - Starting BMI greater then 30  PLAN:  Vitamin D Deficiency Juno was informed that low vitamin D levels contributes to fatigue and are associated with obesity, breast, and colon cancer. She agrees to continue to take prescription Vit D @50 ,000 IU every week and will follow up for routine testing of vitamin D, at least 2-3 times per year. She was informed of the risk of over-replacement of  vitamin D and agrees to not increase her dose unless she discusses this with [name] first. Ramani agrees to follow up in 2 to 3 weeks.  I spent > than 50% of the 15 minute visit on counseling as documented in the note.  Obesity Karron is currently in the action stage of change. As such, her goal is to continue with weight loss efforts. She has agreed to change to the category 2 plan. Alivea has been instructed to work up to a goal of 150 minutes of combined cardio and strengthening exercise per week for weight loss and overall health benefits. We discussed the following Behavioral Modification Strategies today: increasing lean protein intake, decreasing simple carbohydrates, and travel eating strategies.  Izabella has agreed to follow up with our clinic in 2 to 3 weeks. She was informed of the importance of frequent follow up visits to maximize her success with intensive lifestyle modifications for her multiple health conditions.   OBESITY BEHAVIORAL INTERVENTION VISIT  Today's visit was # 17   Starting weight: 204 lbs Starting date: 03/22/17 Today's weight : Weight: 189 lb (85.7 kg)  Today's date: 07/01/2018 Total lbs lost to date: 15  ASK: We discussed the diagnosis of obesity with Gordy Savers today and Jyl agreed to give [name] permission to discuss obesity behavioral modification therapy today.  ASSESS: Molly Giles has the diagnosis of obesity and her BMI today is 29.59 Yoshiye is in the action stage of change.   ADVISE: Molly Giles was educated on the multiple health risks of obesity as well as the benefit of weight loss to improve her health. She was advised of the need for long term treatment and the importance of lifestyle modifications to improve her current health and to decrease her risk of future health problems.  AGREE: Multiple dietary modification options and treatment options were discussed and Molly Giles agreed to follow the recommendations documented in the above note.  ARRANGE: Molly Giles was  educated on the importance of frequent visits to treat obesity as outlined per CMS and USPSTF guidelines and agreed to schedule her next follow up appointment today.  Molly Giles, am acting as transcriptionist for Abby Potash, PA-C I, Abby Potash, PA-C have reviewed above note and agree with its content

## 2018-07-08 NOTE — Progress Notes (Deleted)
Office Visit Note  Patient: Molly Giles             Date of Birth: 1956-05-29           MRN: [id number]             PCP: McDiarmid, Blane Ohara, MD Referring: McDiarmid, Blane Ohara, MD Visit Date: 07/18/2018 Occupation: @GUAROCC @  Subjective:  No chief complaint on file.   History of Present Illness: Molly Giles is a 62 y.o. female ***   Activities of Daily Living:  Patient reports morning stiffness for *** {minute/hour:19697}.   Patient {ACTIONS;DENIES/REPORTS:21021675::"Denies"} nocturnal pain.  Difficulty dressing/grooming: {ACTIONS;DENIES/REPORTS:21021675::"Denies"} Difficulty climbing stairs: {ACTIONS;DENIES/REPORTS:21021675::"Denies"} Difficulty getting out of chair: {ACTIONS;DENIES/REPORTS:21021675::"Denies"} Difficulty using hands for taps, buttons, cutlery, and/or writing: {ACTIONS;DENIES/REPORTS:21021675::"Denies"}  No Rheumatology ROS completed.   PMFS History:  Patient Active Problem List   Diagnosis Date Noted  . 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[place]) 03/10/2018  . Chronic migraine without aura, with intractable migraine, so stated, with status migrainosus 11/28/2017  . URI (upper respiratory infection) 08/14/2017  . Fatigue 07/05/2017  . Insulin resistance 07/04/2017  . Vitamin D deficiency 05/08/2017  . Class 1 obesity without serious comorbidity with body mass index (BMI) of 30.0 to 30.9 in adult 04/24/2017  . Vasomotor symptoms due to menopause 04/19/2017  . Periodic limb movement sleep disorder 03/28/2017  . Nocturnal hypoxemia 03/06/2017  . Morbid obesity ([place]) 03/06/2017  . Snoring 03/06/2017  . Gasping for breath 03/06/2017  . Sleep walking and eating 03/06/2017  . Episodic cluster headache, not intractable 03/06/2017  . Obesity (BMI 30.0-34.9) 01/29/2017  . Other insomnia 11/09/2016  . S/P left THA, AA 07/25/2016  . Irritable bowel syndrome with diarrhea 04/03/2016  . Chronic migraine 02/25/2016  . Left ventricular hypertrophy, mild 02/25/2016  . History  of irritable bowel syndrome 02/15/2016  . 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 Osteoarthritis of left hip 11/01/2015  . Hearing loss of both ears 07/27/2015  . Post concussive syndrome 07/14/2015  . Depression (NOS) 07/02/2015  . Allergic rhinoconjunctivitis 07/02/2015  . Fibromyalgia syndrome 07/01/2015  . History of Clostridium difficile colitis, persistent 07/01/2015  . Impairment of balance 02/10/2015  . Essential hypertension 05/15/2013  . Hypothyroidism 05/15/2013  . Hyperlipidemia 05/15/2013  . Interstitial cystitis 05/15/2013  . Chronic migraine without aura 05/15/2013  . Spondylosis of lumbar region without myelopathy or radiculopathy 10/30/2011    Past Medical History:  Diagnosis Date  . Allergic rhinoconjunctivitis 07/02/2015  . Arthritis   . Benign positional vertigo 04/2015   Responded well to [facility]  . Bruxism (teeth grinding)   . Chronic migraine 02/25/2016   Per Dr Jaynee Eagles review in notes from [facility] from September 2015. Showed total headache days last month 18. Severe headache days 7 days. Moderate headache days 5 days. Mild headache days last month sick days. Days without headache last month 10 days. Symptoms associated with photophobia, phonophobia, osmophobia, neck pain, dizziness, jaw pain, nasal congestion, vision disturbances, tingling and numbness, weakness and worsening with activity. Each headache attack last 3 hours depending on treatment in severity. Left side, the right side, easier side, the frontal area in the back of the head. Characterized as throbbing, pressure, tightness, squeezing, stabbing and burning   . Chronic migraine w/o aura w/o status migrainosus, not intractable 07/14/2015  . DDD (degenerative disc disease), lumbar   . Degenerative cervical disc   . Depression   . Disc displacement, lumbar   . Dysrhythmia  seen by dr Wynonia Lawman- not a problem since she has been on Bystolic  . Essential hypertension  05/15/2013  . Family history of adverse reaction to anesthesia    Brother- N/V  . Family history of premature CAD 05/15/2013  . Fibromyalgia   . GERD (gastroesophageal reflux disease) 12/16/2014  . H/O seasonal allergies   . Hammer toe    Left great toe  . Hearing loss of both ears 07/27/2015   mild to borderline moderate low frequency hearing loss improving to within normal limits bilaterally on audiology testing at [facility] in November 2016.    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 History of irritable bowel syndrome 02/15/2016  . History of migraine headaches 05/15/2013   Per Dr Jaynee Eagles review in notes from [facility] from September 2015. Showed total headache days last month 18. Severe headache days 7 days. Moderate headache days 5 days. Mild headache days last month sick days. Days without headache last month 10 days. Symptoms associated with photophobia, phonophobia, osmophobia, neck pain, dizziness, jaw pain, nasal congestion, vision disturbances, tingling and numbness, weakness and worsening with activity. Each headache attack last 3 hours depending on treatment in severity. Left side, the right side, easier side, the frontal area in the back of the head. Characterized as throbbing, pressure, tightness, squeezing, stabbing and burning    . Hx of bad fall 02/2015   Severe Facial/head trauma without fracture  . Hyperlipidemia 1998  . Hypertension 2004  . Hypothyroidism   . Impairment of balance 02/2015   Consequent of postconcussive syndrome  . Interstitial cystitis   . 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[place]) 03/10/2018   [facility]  . Lumbar facet joint pain   . 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 Migraine 1962   Chronic Migraines  . Mood disorder ([place])   . Musculoskeletal neck pain 07/14/2015  . Normal coronary arteries 05/14/2014  . Osteoarthritis of left hip 11/01/2015   MRI order by Dr Alvan Dame  (ortho) 10/2015 showed significant arthritis of left hip joint with cystic changes in femoral head c/w osteoarthritis  . 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 Overweight   . Pain in joint of left shoulder 11/09/2016  . Palpitations 05/15/2013   [facility] manages  . Periodic limb movement sleep disorder 03/28/2017  . Perirectal cyst 05/07/2016  . PONV (postoperative nausea and vomiting)   . Post concussion syndrome 06/06/2015  . Post concussive syndrome 07/14/2015   Ms Balfour's post-concussive syndrome manifesting in vertigo and headache, mood changes, poor balance, dizziness, and decreased concentration per Dr Jaynee Eagles at [facility].   Marland Kitchen Posterior vitreous detachment of right eye 2014  . Shingles   . Shortness of breath dyspnea    with exertion  . Thyroid nodule 08/11/2009   Findings: The thyroid gland is within normal limits in size.  The gland is diffusely inhomogeneous. A small solid nodule is noted in the lower pole  medially on the right of 7 x 6 x 8 mm. A small solid nodule is noted inferiorly on the left of 3 x 3 x 4 mm.  IMPRESSION:  The thyroid gland is within normal limits in size with only small solid nodules present, the largest of only 8 mm in diameter on the right.    . 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   . Vasomotor  symptoms due to menopause 04/19/2017    Family History  Problem Relation Age of Onset  . Alzheimer's disease Mother   . Hyperlipidemia Mother   . Hypertension Mother   . Osteoporosis Mother   . Parkinson's disease Mother   . Migraines Sister   . Allergies Sister   . Hypertension Sister   . Hyperlipidemia Brother   . Cardiomyopathy Brother   . Diabetes type II Brother   . Kidney disease Brother   . Asthma Brother   . Heart disease Brother   . Hypertension Brother   . Hyperlipidemia Brother   . Kidney disease Brother   . Heart disease  Father   . Hyperlipidemia Father   . Hypertension Father   . Aortic aneurysm Father   . Early death Father 65  . Diabetes type II Unknown   . Breast cancer Maternal Aunt    Past Surgical History:  Procedure Laterality Date  . Bladder dilitation     x 3  . BREAST BIOPSY  2011   Benign histology  . CARDIOVASCULAR STRESS TEST  2000   Unremarkable per pt report  . CARPOMETACARPAL JOINT ARTHROTOMY Right 2011  . COLONOSCOPY    . COLONOSCOPY WITH PROPOFOL N/A 04/21/2015   Procedure: COLONOSCOPY WITH PROPOFOL;  Surgeon: Carol Ada, MD;  Location: [place];  Service: Endoscopy;  Laterality: N/A;  . EPIDURAL BLOCK INJECTION Left 04/12/2016   Left Medial Nerve Block and Left L5 ramus block, Dr Suella Broad   . EPIDURAL BLOCK INJECTION  03/21/2016   Left L3-4 medial branch block and Left L5 & dorsal ramus block   . EPIDURAL BLOCK INJECTION N/A 10/25/2016   Suella Broad, MD. Lumbar medial branch block  . EPIDURAL BLOCK INJECTION N/A 02/09/2017   Suella Broad, MD.  Bilateral L3/4 medial branch block, bilateral L5 dorsal ramus block  . EPIDURAL BLOCK INJECTION N/A 07/04/2017   Suella Broad, MD  . FECAL TRANSPLANT  04/21/2015   Procedure: FECAL TRANSPLANT;  Surgeon: Carol Ada, MD;  Location: [place];  Service: Endoscopy;;  . HIP ARTHROPLASTY Left   . HIP ARTHROSCOPY Left 03/06/2018   Left hip arthroplasty, redo for loose hip arthroplasty. Procedure at [facility]  . INJECTION HIP INTRA ARTICULAR Left 11/2015   for OA by Dr Suella Broad  . OTHER SURGICAL HISTORY Left 2016   Left L3/L4 medial nerve block and Left L5 Dorsal Ramus block Dr Mickel Duhamel  . TOTAL HIP ARTHROPLASTY Left 07/25/2016   Procedure: LEFT TOTAL HIP ARTHROPLASTY ANTERIOR APPROACH;  Surgeon: Paralee Cancel, MD;  Location: [facility];  Service: Orthopedics;  Laterality: Left;   Social History   Social History Narrative   Prior PCP With [facility] at [place], [place].   Married, lives  with Hancock, (b. 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    Wears seatbelt usually   No religious beliefs affecting healthcare   No difficulty taking medications as directed.       Home has working smoke alarm   No home throw rugs   Does not have nonslip bathtub / shower    Has railings on all stairs   Home is free from [place].    Right-handed.      No history of Hospitalization as of 07/2016.       Best number to reach patient [phone] (M) as of 07/01/15   It is permissible to leave messages as of  07/01/15 .    No regular exercise of 3 times a week for 30 minutes at a time      Angell Pincock has Advanced Directive and a Doctor, general practice is husband, Jeanee Fabre [phone]      Caffeine: 3 cups coffee per day, sometimes 2-3 cups of tea    Objective: Vital Signs: There were no vitals taken for this visit.   Physical Exam   Musculoskeletal Exam: ***  CDAI Exam: CDAI Score: Not documented Patient Global Assessment: Not documented; Provider Global Assessment: Not documented Swollen: Not documented; Tender: Not documented Joint Exam   Not documented   There is currently no information documented on the homunculus. Go to the Rheumatology activity and complete the homunculus joint exam.  Investigation: No additional findings.  Imaging: No results found.  Recent Labs: Lab Results  Component Value Date   WBC 6.7 05/07/2018   HGB 12.6 05/07/2018   PLT 271 03/09/2018   NA 138 05/07/2018   K 4.4 05/07/2018   CL 102 05/07/2018   CO2 22 05/07/2018   GLUCOSE 85 05/07/2018   BUN 16 05/07/2018   CREATININE 0.80 05/07/2018   BILITOT 0.2 05/07/2018   ALKPHOS 79 05/07/2018   AST 16 05/07/2018   ALT 15 05/07/2018   PROT 6.7 05/07/2018   ALBUMIN 4.2 05/07/2018   CALCIUM 9.3 05/07/2018   GFRAA 91 05/07/2018    Speciality Comments: No specialty comments available.  Procedures:  No  procedures performed Allergies: Sulfa antibiotics; Chlorhexidine gluconate; Codeine; Lyrica [pregabalin]; Methylprednisolone; Prednisone; Betadine [povidone iodine]; Latex; and Wellbutrin [bupropion]   Assessment / Plan:     Visit Diagnoses: Fibromyalgia  Other fatigue  Other insomnia - Ambien 1/2-1 tablet at bedtime   Primary osteoarthritis of both hands  Trochanteric bursitis of left hip  Status post left hip replacement  DDD (degenerative disc disease), lumbar  Chronic pain syndrome  History of migraine  History of depression  IC (interstitial cystitis)  Vitamin D deficiency  History of hypothyroidism  History of hypertension  History of hyperlipidemia   Orders: No orders of the defined types were placed in this encounter.  No orders of the defined types were placed in this encounter.   Face-to-face time spent with patient was *** minutes. Greater than 50% of time was spent in counseling and coordination of care.  Follow-Up Instructions: No follow-ups on file.   Ofilia Neas, PA-C  Note - This record has been created using Dragon software.  Chart creation errors have been sought, but may not always  have been located. Such creation errors do not reflect on  the standard of medical care.

## 2018-07-15 NOTE — Progress Notes (Signed)
Office Visit Note  Patient: Molly Giles             Date of Birth: [birth year]           MRN: [id number]             PCP: McDiarmid, Blane Ohara, MD Referring: McDiarmid, Blane Ohara, MD Visit Date: 07/24/2018 Occupation: @GUAROCC @  Subjective:  Neck and shoulder pain.   History of Present Illness: Molly Giles is a 62 y.o. female with history of fibromyalgia, osteoarthritis, and DDD.  Total left hip revision surgery June 24th, 2019 and feels like she has recovered well.  States that her legs are now different lengths which can cause some pain in right hip.  Right hip does show osteoarthritis but does not warrant a replacement at this time.  Started taking Meloxicam for left knee pain with marked improvement.  Reports shoulder pain especially on the right side that disrupts her sleep.  Reports pain in neck, shoulders, upper back along the spine and is constant. Reports rash on left knee. Reports problems with allergies.  Activities of Daily Living:  Patient reports morning stiffness for 5-10 minutes.   Patient Reports nocturnal pain.  Difficulty dressing/grooming: Denies Difficulty climbing stairs: Denies Difficulty getting out of chair: Denies Difficulty using hands for taps, buttons, cutlery, and/or writing: Denies  Review of Systems  Constitutional: Positive for fatigue and weight gain. Negative for night sweats and weight loss.  HENT: Positive for mouth sores and mouth dryness. Negative for trouble swallowing, trouble swallowing and nose dryness.   Eyes: Positive for dryness. Negative for pain, redness, itching and visual disturbance.  Respiratory: Negative for cough, shortness of breath, wheezing and difficulty breathing.   Cardiovascular: Negative for chest pain, palpitations, hypertension, irregular heartbeat and swelling in legs/feet.  Gastrointestinal: Negative for abdominal pain, blood in stool, constipation, diarrhea, nausea and vomiting.  Endocrine: Negative for increased urination.   Genitourinary: Negative for painful urination, nocturia, pelvic pain and vaginal dryness.  Musculoskeletal: Positive for arthralgias, joint pain and morning stiffness. Negative for joint swelling, myalgias, muscle weakness, muscle tenderness and myalgias.  Skin: Positive for rash. Negative for color change, hair loss, skin tightness, ulcers and sensitivity to sunlight.  Allergic/Immunologic: Negative for susceptible to infections.  Neurological: Negative for dizziness, light-headedness, headaches, memory loss, night sweats and weakness.  Hematological: Negative for bruising/bleeding tendency and swollen glands.  Psychiatric/Behavioral: Negative for depressed mood, confusion and sleep disturbance. The patient is not nervous/anxious.     PMFS History:  Patient Active Problem List   Diagnosis Date Noted  . 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[place]) 03/10/2018  . Chronic migraine without aura, with intractable migraine, so stated, with status migrainosus 11/28/2017  . URI (upper respiratory infection) 08/14/2017  . Fatigue 07/05/2017  . Insulin resistance 07/04/2017  . Vitamin D deficiency 05/08/2017  . Class 1 obesity without serious comorbidity with body mass index (BMI) of 30.0 to 30.9 in adult 04/24/2017  . Vasomotor symptoms due to menopause 04/19/2017  . Periodic limb movement sleep disorder 03/28/2017  . Nocturnal hypoxemia 03/06/2017  . Morbid obesity ([place]) 03/06/2017  . Snoring 03/06/2017  . Gasping for breath 03/06/2017  . Sleep walking and eating 03/06/2017  . Episodic cluster headache, not intractable 03/06/2017  . Obesity (BMI 30.0-34.9) 01/29/2017  . Other insomnia 11/09/2016  . S/P left THA, AA 07/25/2016  . Irritable bowel syndrome with diarrhea 04/03/2016  . Chronic migraine 02/25/2016  . Left ventricular hypertrophy, mild 02/25/2016  . History of irritable  bowel syndrome 02/15/2016  . 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 Osteoarthritis of left hip  11/01/2015  . Hearing loss of both ears 07/27/2015  . Post concussive syndrome 07/14/2015  . Depression (NOS) 07/02/2015  . Allergic rhinoconjunctivitis 07/02/2015  . Fibromyalgia syndrome 07/01/2015  . History of Clostridium difficile colitis, persistent 07/01/2015  . Impairment of balance 02/10/2015  . Essential hypertension 05/15/2013  . Hypothyroidism 05/15/2013  . Hyperlipidemia 05/15/2013  . Interstitial cystitis 05/15/2013  . Chronic migraine without aura 05/15/2013  . Spondylosis of lumbar region without myelopathy or radiculopathy 10/30/2011    Past Medical History:  Diagnosis Date  . Allergic rhinoconjunctivitis 07/02/2015  . Arthritis   . Benign positional vertigo 04/2015   Responded well to [facility]  . Bruxism (teeth grinding)   . Chronic migraine 02/25/2016   Per Dr Jaynee Eagles review in notes from [facility] from September 2015. Showed total headache days last month 18. Severe headache days 7 days. Moderate headache days 5 days. Mild headache days last month sick days. Days without headache last month 10 days. Symptoms associated with photophobia, phonophobia, osmophobia, neck pain, dizziness, jaw pain, nasal congestion, vision disturbances, tingling and numbness, weakness and worsening with activity. Each headache attack last 3 hours depending on treatment in severity. Left side, the right side, easier side, the frontal area in the back of the head. Characterized as throbbing, pressure, tightness, squeezing, stabbing and burning   . Chronic migraine w/o aura w/o status migrainosus, not intractable 07/14/2015  . DDD (degenerative disc disease), lumbar   . Degenerative cervical disc   . Depression   . Disc displacement, lumbar   . Dysrhythmia    seen by dr Wynonia Lawman- not a problem since she has been on Bystolic  . Essential hypertension 05/15/2013  . Family history of adverse reaction to anesthesia    Brother- N/V  . Family history of premature CAD 05/15/2013    . Fibromyalgia   . GERD (gastroesophageal reflux disease) 12/16/2014  . H/O seasonal allergies   . Hammer toe    Left great toe  . Hearing loss of both ears 07/27/2015   mild to borderline moderate low frequency hearing loss improving to within normal limits bilaterally on audiology testing at [facility] in November 2016.    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 History of irritable bowel syndrome 02/15/2016  . History of migraine headaches 05/15/2013   Per Dr Jaynee Eagles review in notes from [facility] from September 2015. Showed total headache days last month 18. Severe headache days 7 days. Moderate headache days 5 days. Mild headache days last month sick days. Days without headache last month 10 days. Symptoms associated with photophobia, phonophobia, osmophobia, neck pain, dizziness, jaw pain, nasal congestion, vision disturbances, tingling and numbness, weakness and worsening with activity. Each headache attack last 3 hours depending on treatment in severity. Left side, the right side, easier side, the frontal area in the back of the head. Characterized as throbbing, pressure, tightness, squeezing, stabbing and burning    . Hx of bad fall 02/2015   Severe Facial/head trauma without fracture  . Hyperlipidemia 1998  . Hypertension 2004  . Hypothyroidism   . Impairment of balance 02/2015   Consequent of postconcussive syndrome  . Interstitial cystitis   . 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[place]) 03/10/2018   [facility]  . Lumbar facet joint pain   . 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  Migraine 1962   Chronic Migraines  . Mood disorder ([place])   . Musculoskeletal neck pain 07/14/2015  . Normal coronary arteries 05/14/2014  . Osteoarthritis of left hip 11/01/2015   MRI order by Dr Alvan Dame (ortho) 10/2015 showed significant arthritis of left hip joint with cystic changes in femoral head c/w osteoarthritis  .  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 Overweight   . Pain in joint of left shoulder 11/09/2016  . Palpitations 05/15/2013   [facility] manages  . Periodic limb movement sleep disorder 03/28/2017  . Perirectal cyst 05/07/2016  . PONV (postoperative nausea and vomiting)   . Post concussion syndrome 06/06/2015  . Post concussive syndrome 07/14/2015   Ms Kessinger's post-concussive syndrome manifesting in vertigo and headache, mood changes, poor balance, dizziness, and decreased concentration per Dr Jaynee Eagles at [facility].   Marland Kitchen Posterior vitreous detachment of right eye 2014  . Shingles   . Shortness of breath dyspnea    with exertion  . Thyroid nodule 08/11/2009   Findings: The thyroid gland is within normal limits in size.  The gland is diffusely inhomogeneous. A small solid nodule is noted in the lower pole  medially on the right of 7 x 6 x 8 mm. A small solid nodule is noted inferiorly on the left of 3 x 3 x 4 mm.  IMPRESSION:  The thyroid gland is within normal limits in size with only small solid nodules present, the largest of only 8 mm in diameter on the right.    . 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   . Vasomotor symptoms due to menopause 04/19/2017    Family History  Problem Relation Age of Onset  . Alzheimer's disease Mother   . Hyperlipidemia Mother   . Hypertension Mother   . Osteoporosis Mother   . Parkinson's disease Mother   . Migraines Sister   . Allergies Sister   . Hypertension Sister   . Hyperlipidemia Brother   . Cardiomyopathy Brother   . Diabetes type II Brother   . Kidney disease Brother   . Asthma Brother   . Heart disease Brother   . Hypertension Brother   . Hyperlipidemia Brother   . Kidney disease Brother   . Heart disease Father   . Hyperlipidemia Father   . Hypertension Father   . Aortic aneurysm Father   . Early death Father 78  .  Diabetes type II Unknown   . Breast cancer Maternal Aunt    Past Surgical History:  Procedure Laterality Date  . Bladder dilitation     x 3  . BREAST BIOPSY  2011   Benign histology  . CARDIOVASCULAR STRESS TEST  2000   Unremarkable per pt report  . CARPOMETACARPAL JOINT ARTHROTOMY Right 2011  . COLONOSCOPY    . COLONOSCOPY WITH PROPOFOL N/A 04/21/2015   Procedure: COLONOSCOPY WITH PROPOFOL;  Surgeon: Carol Ada, MD;  Location: [place];  Service: Endoscopy;  Laterality: N/A;  . EPIDURAL BLOCK INJECTION Left 04/12/2016   Left Medial Nerve Block and Left L5 ramus block, Dr Suella Broad   . EPIDURAL BLOCK INJECTION  03/21/2016   Left L3-4 medial branch block and Left L5 & dorsal ramus block   . EPIDURAL BLOCK INJECTION N/A 10/25/2016   Suella Broad, MD. Lumbar medial branch block  . EPIDURAL BLOCK INJECTION N/A 02/09/2017   Suella Broad, MD.  Bilateral L3/4 medial branch block,  bilateral L5 dorsal ramus block  . EPIDURAL BLOCK INJECTION N/A 07/04/2017   Suella Broad, MD  . FECAL TRANSPLANT  04/21/2015   Procedure: FECAL TRANSPLANT;  Surgeon: Carol Ada, MD;  Location: [place];  Service: Endoscopy;;  . HIP ARTHROPLASTY Left   . HIP ARTHROSCOPY Left 03/06/2018   Left hip arthroplasty, redo for loose hip arthroplasty. Procedure at [facility]  . INJECTION HIP INTRA ARTICULAR Left 11/2015   for OA by Dr Suella Broad  . OTHER SURGICAL HISTORY Left 2016   Left L3/L4 medial nerve block and Left L5 Dorsal Ramus block Dr Mickel Duhamel  . TOTAL HIP ARTHROPLASTY Left 07/25/2016   Procedure: LEFT TOTAL HIP ARTHROPLASTY ANTERIOR APPROACH;  Surgeon: Paralee Cancel, MD;  Location: [facility];  Service: Orthopedics;  Laterality: Left;   Social History   Social History Narrative   Prior PCP With [facility] at [place], [place].   Married, lives with [place], (b. 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    Wears seatbelt usually   No religious beliefs affecting healthcare   No difficulty taking medications as directed.       Home has working smoke alarm   No home throw rugs   Does not have nonslip bathtub / shower    Has railings on all stairs   Home is free from [place].    Right-handed.      No history of Hospitalization as of 07/2016.       Best number to reach patient [phone] (M) as of 07/01/15   It is permissible to leave messages as of 07/01/15 .    No regular exercise of 3 times a week for 30 minutes at a time      Chevon Fomby has Advanced Directive and a Doctor, general practice is husband, Chalise Pe [phone]      Caffeine: 3 cups coffee per day, sometimes 2-3 cups of tea    Objective: Vital Signs: BP 129/85 (BP Location: Left Arm, Patient Position: Sitting, Cuff Size: Normal)   Pulse 82   Resp 14   Ht 5' 6.5" (1.689 m)   Wt 196 lb 3.2 oz (89 kg)   BMI 31.19 kg/m    Physical Exam  Constitutional: She is oriented to person, place, and time. She appears well-developed and well-nourished.  HENT:  Head: Normocephalic and atraumatic.  Eyes: Conjunctivae and EOM are normal.  Neck: Normal range of motion.  Cardiovascular: Normal rate, regular rhythm, normal heart sounds and intact distal pulses.  Pulmonary/Chest: Effort normal and breath sounds normal.  Abdominal: Soft. Bowel sounds are normal.  Lymphadenopathy:    She has no cervical adenopathy.  Neurological: She is alert and oriented to person, place, and time.  Skin: Skin is warm and dry. Capillary refill takes less than 2 seconds.  Psychiatric: She has a normal mood and affect. Her behavior is normal.  Nursing note and vitals reviewed.    Musculoskeletal Exam: C-spine thoracic lumbar spine good range of motion.  She has stiffness and discomfort range of motion of cervical spine.  She had left trapezius spasm.  Shoulder joints elbow joints wrist joints with good range of motion.  She has  osteoarthritic changes in her hands with DIP PIP and CMC thickening.  She had right [place] surgery in the past.  Her left total hip replacement is doing well.  She has some stiffness with range  of motion of right hip joint.  Knee joints were in good range of motion.  Leg length discrepancy was noted.  CDAI Exam: CDAI Score: Not documented Patient Global Assessment: Not documented; Provider Global Assessment: Not documented Swollen: Not documented; Tender: Not documented Joint Exam   Not documented   There is currently no information documented on the homunculus. Go to the Rheumatology activity and complete the homunculus joint exam.  Investigation: No additional findings.  Imaging: No results found.  Recent Labs: Lab Results  Component Value Date   WBC 6.7 05/07/2018   HGB 12.6 05/07/2018   PLT 271 03/09/2018   NA 138 05/07/2018   K 4.4 05/07/2018   CL 102 05/07/2018   CO2 22 05/07/2018   GLUCOSE 85 05/07/2018   BUN 16 05/07/2018   CREATININE 0.80 05/07/2018   BILITOT 0.2 05/07/2018   ALKPHOS 79 05/07/2018   AST 16 05/07/2018   ALT 15 05/07/2018   PROT 6.7 05/07/2018   ALBUMIN 4.2 05/07/2018   CALCIUM 9.3 05/07/2018   GFRAA 91 05/07/2018    Speciality Comments: No specialty comments available.  Procedures:  Trigger Point Inj Date/Time: 07/24/2018 3:59 PM Performed by: Bo Merino, MD Authorized by: Bo Merino, MD   Consent Given by:  Patient Site marked: the procedure site was marked   Timeout: prior to procedure the correct patient, procedure, and site was verified   Indications:  Muscle spasm and pain Total # of Trigger Points:  1 Location: neck   Needle Size:  27 G Approach:  Dorsal Medications #1:  0.5 mL lidocaine 1 %; 10 mg triamcinolone acetonide 40 MG/ML Patient tolerance:  Patient tolerated the procedure well with no immediate complications   Allergies: Sulfa antibiotics; Chlorhexidine gluconate; Codeine; Lyrica [pregabalin];  Methylprednisolone; Prednisone; Betadine [povidone iodine]; Latex; and Wellbutrin [bupropion]   Assessment / Plan:     Visit Diagnoses: Fibromyalgia -she been experiencing increased pain and discomfort all over.  She has been taking Ambien which helps her to sleep at night.  She also has been taking muscle relaxer which helps.  Prescription refill for Ambien was given per request.  Plan: zolpidem (AMBIEN) 10 MG tablet,  Other insomnia - She takes Ambien 1/2 tab or 1 tab at bedtime for insomnia.  Other fatigue-secondary to fibromyalgia and insomnia.  Neck pain-she had pain and stiffness in her right trapezius area.  After informed consent was obtained right trapezius was injected with lidocaine and Kenalog as described above.  She tolerated the procedure well.  Neck exercises were discussed.  Primary osteoarthritis of both hands-joint protection muscle strengthening was discussed.  Trochanteric bursitis of left hip-IT band stretches were discussed.  Status post left hip replacement - left total knee arthroplasty revision on March 04, 2018.  She has leg length discrepancy which causes discomfort.  DDD (degenerative disc disease), lumbar-she continues to have some lower back pain.  Other medical problems are listed as follows:  Chronic pain syndrome  Vitamin D deficiency  Other depression - celexa - Plan: citalopram (CELEXA) 20 MG tablet  History of hyperlipidemia  IC (interstitial cystitis)  History of migraine - Ajovy injections.   History of hypertension  History of hypothyroidism  Other depression - Plan: citalopram (CELEXA) 20 MG tablet   Orders: Orders Placed This Encounter  Procedures  . Trigger Point Inj   Meds ordered this encounter  Medications  . DISCONTD: zolpidem (AMBIEN) 10 MG tablet    Sig: TAKE 1 TABLET(10 MG) BY MOUTH AT BEDTIME AS NEEDED  Dispense:  30 tablet    Refill:  0  . citalopram (CELEXA) 20 MG tablet    Sig: One Tablet p.o. daily     Dispense:  90 tablet    Refill:  0  . zolpidem (AMBIEN) 10 MG tablet    Sig: TAKE 1 TABLET(10 MG) BY MOUTH AT BEDTIME AS NEEDED    Dispense:  30 tablet    Refill:  0      Follow-Up Instructions: Return in about 6 months (around 01/22/2019).   Bo Merino, MD  Note - This record has been created using Editor, commissioning.  Chart creation errors have been sought, but may not always  have been located. Such creation errors do not reflect on  the standard of medical care.

## 2018-07-17 ENCOUNTER — Encounter: Payer: Self-pay | Admitting: Family Medicine

## 2018-07-17 DIAGNOSIS — K580 Irritable bowel syndrome with diarrhea: Secondary | ICD-10-CM

## 2018-07-18 ENCOUNTER — Ambulatory Visit: Payer: BLUE CROSS/BLUE SHIELD | Admitting: Physician Assistant

## 2018-07-22 ENCOUNTER — Ambulatory Visit (INDEPENDENT_AMBULATORY_CARE_PROVIDER_SITE_OTHER): Payer: BLUE CROSS/BLUE SHIELD | Admitting: Physician Assistant

## 2018-07-24 ENCOUNTER — Ambulatory Visit (INDEPENDENT_AMBULATORY_CARE_PROVIDER_SITE_OTHER): Payer: BLUE CROSS/BLUE SHIELD | Admitting: Rheumatology

## 2018-07-24 ENCOUNTER — Encounter: Payer: Self-pay | Admitting: Rheumatology

## 2018-07-24 VITALS — BP 129/85 | HR 82 | Resp 14 | Ht 66.5 in | Wt 196.2 lb

## 2018-07-24 DIAGNOSIS — M19041 Primary osteoarthritis, right hand: Secondary | ICD-10-CM

## 2018-07-24 DIAGNOSIS — Z8669 Personal history of other diseases of the nervous system and sense organs: Secondary | ICD-10-CM

## 2018-07-24 DIAGNOSIS — G4709 Other insomnia: Secondary | ICD-10-CM

## 2018-07-24 DIAGNOSIS — F3289 Other specified depressive episodes: Secondary | ICD-10-CM

## 2018-07-24 DIAGNOSIS — Z96642 Presence of left artificial hip joint: Secondary | ICD-10-CM

## 2018-07-24 DIAGNOSIS — M51369 Other intervertebral disc degeneration, lumbar region without mention of lumbar back pain or lower extremity pain: Secondary | ICD-10-CM

## 2018-07-24 DIAGNOSIS — M5136 Other intervertebral disc degeneration, lumbar region: Secondary | ICD-10-CM

## 2018-07-24 DIAGNOSIS — M7062 Trochanteric bursitis, left hip: Secondary | ICD-10-CM

## 2018-07-24 DIAGNOSIS — M19042 Primary osteoarthritis, left hand: Secondary | ICD-10-CM

## 2018-07-24 DIAGNOSIS — M542 Cervicalgia: Secondary | ICD-10-CM | POA: Diagnosis not present

## 2018-07-24 DIAGNOSIS — R5383 Other fatigue: Secondary | ICD-10-CM

## 2018-07-24 DIAGNOSIS — Z8639 Personal history of other endocrine, nutritional and metabolic disease: Secondary | ICD-10-CM

## 2018-07-24 DIAGNOSIS — M797 Fibromyalgia: Secondary | ICD-10-CM

## 2018-07-24 DIAGNOSIS — Z8679 Personal history of other diseases of the circulatory system: Secondary | ICD-10-CM

## 2018-07-24 DIAGNOSIS — E559 Vitamin D deficiency, unspecified: Secondary | ICD-10-CM

## 2018-07-24 DIAGNOSIS — N3010 Interstitial cystitis (chronic) without hematuria: Secondary | ICD-10-CM

## 2018-07-24 DIAGNOSIS — G894 Chronic pain syndrome: Secondary | ICD-10-CM

## 2018-07-24 MED ORDER — ZOLPIDEM TARTRATE 10 MG PO TABS: ORAL_TABLET | ORAL | 0 refills | Status: DC

## 2018-07-24 MED ORDER — TRIAMCINOLONE ACETONIDE 40 MG/ML IJ SUSP
10.0000 mg | INTRAMUSCULAR | Status: AC | PRN
  Administered 2018-07-24: 10 mg via INTRAMUSCULAR

## 2018-07-24 MED ORDER — CITALOPRAM HYDROBROMIDE 20 MG PO TABS: ORAL_TABLET | ORAL | 0 refills | Status: DC

## 2018-07-24 MED ORDER — LIDOCAINE HCL 1 % IJ SOLN
0.5000 mL | INTRAMUSCULAR | Status: AC | PRN
  Administered 2018-07-24: .5 mL

## 2018-07-29 ENCOUNTER — Other Ambulatory Visit: Payer: Self-pay | Admitting: Family Medicine

## 2018-07-29 DIAGNOSIS — R109 Unspecified abdominal pain: Secondary | ICD-10-CM

## 2018-07-29 DIAGNOSIS — R197 Diarrhea, unspecified: Secondary | ICD-10-CM

## 2018-07-29 NOTE — Progress Notes (Signed)
Referral to Dr [place] Cellar for recurrent abdominal cramping and diarrhea.

## 2018-07-31 ENCOUNTER — Ambulatory Visit (INDEPENDENT_AMBULATORY_CARE_PROVIDER_SITE_OTHER): Payer: BLUE CROSS/BLUE SHIELD | Admitting: Physician Assistant

## 2018-07-31 ENCOUNTER — Encounter (INDEPENDENT_AMBULATORY_CARE_PROVIDER_SITE_OTHER): Payer: Self-pay

## 2018-08-06 ENCOUNTER — Encounter: Payer: Self-pay | Admitting: Family Medicine

## 2018-08-06 ENCOUNTER — Other Ambulatory Visit: Payer: Self-pay | Admitting: Family Medicine

## 2018-08-06 DIAGNOSIS — L259 Unspecified contact dermatitis, unspecified cause: Secondary | ICD-10-CM

## 2018-08-06 HISTORY — DX: Unspecified contact dermatitis, unspecified cause: L25.9

## 2018-09-06 ENCOUNTER — Other Ambulatory Visit: Payer: Self-pay | Admitting: Family Medicine

## 2018-09-06 DIAGNOSIS — Z1231 Encounter for screening mammogram for malignant neoplasm of breast: Secondary | ICD-10-CM

## 2018-09-14 ENCOUNTER — Other Ambulatory Visit: Payer: Self-pay | Admitting: Rheumatology

## 2018-09-14 DIAGNOSIS — F3289 Other specified depressive episodes: Secondary | ICD-10-CM

## 2018-09-16 NOTE — Telephone Encounter (Signed)
Last visit: 09/14/18 Next Visit: 01/22/19  Okay to refill per Dr. Estanislado Pandy

## 2018-09-25 ENCOUNTER — Telehealth: Payer: Self-pay | Admitting: Rheumatology

## 2018-09-25 NOTE — Telephone Encounter (Signed)
Patient called stating her insurance company will only pay for 15 tablets of her Zolpidem per month.  Patient states she has been cutting the tablets in 1/2 to give her a 30 day supply and it is not strong enough to help with sleep.  Patient is requesting a return call to discuss a stronger dosage.

## 2018-09-26 NOTE — Telephone Encounter (Signed)
Patient advised we will have to do a prior authorization for the insurance in order for them to possibly give her a month supply.

## 2018-09-30 ENCOUNTER — Encounter: Payer: Self-pay | Admitting: Family Medicine

## 2018-09-30 DIAGNOSIS — Z860100 Personal history of colon polyps, unspecified: Secondary | ICD-10-CM | POA: Insufficient documentation

## 2018-10-09 ENCOUNTER — Other Ambulatory Visit: Payer: Self-pay | Admitting: Rheumatology

## 2018-10-09 ENCOUNTER — Ambulatory Visit: Payer: BLUE CROSS/BLUE SHIELD

## 2018-10-09 DIAGNOSIS — F3289 Other specified depressive episodes: Secondary | ICD-10-CM

## 2018-10-16 ENCOUNTER — Telehealth: Payer: Self-pay | Admitting: Rheumatology

## 2018-10-16 ENCOUNTER — Other Ambulatory Visit: Payer: Self-pay | Admitting: Physician Assistant

## 2018-10-16 DIAGNOSIS — M797 Fibromyalgia: Secondary | ICD-10-CM

## 2018-10-16 NOTE — Telephone Encounter (Signed)
Patient left a voicemail stating she just talked to you about her prescription of Zolpidem.  Patient states she just received a text from her pharmacy saying  "was not approved."  Patient requested a return call ASAP.

## 2018-10-16 NOTE — Telephone Encounter (Signed)
ok 

## 2018-10-16 NOTE — Telephone Encounter (Signed)
Last visit: 09/14/18 Next Visit: 01/22/19  Okay to refill Ambien?

## 2018-10-16 NOTE — Telephone Encounter (Signed)
Patient left a voicemail requesting a return call to discuss her prescription of Zolpidem.

## 2018-10-17 ENCOUNTER — Ambulatory Visit
Admission: RE | Admit: 2018-10-17 | Discharge: 2018-10-17 | Disposition: A | Discharge status: Home / Self Care | Payer: BLUE CROSS/BLUE SHIELD | Source: Ambulatory Visit | Attending: Family Medicine | Admitting: Family Medicine

## 2018-10-17 DIAGNOSIS — Z1231 Encounter for screening mammogram for malignant neoplasm of breast: Secondary | ICD-10-CM

## 2018-10-17 NOTE — Telephone Encounter (Signed)
Spoke with patient and she states that her insurance will only cover 15 days of Ambien. Patient states she is having to pay out of pocket for the other 15 days. Patient is asking if there is another way that it can be written so that the insurance may cover the other 15 days. Advised patient that unfortunately there is not.

## 2018-10-17 NOTE — Telephone Encounter (Signed)
Attempted to contact patient and left message for patient to call the office. Prescription was faxed 10/16/18 to the pharmacy.

## 2018-11-07 DIAGNOSIS — N3010 Interstitial cystitis (chronic) without hematuria: Secondary | ICD-10-CM | POA: Insufficient documentation

## 2018-11-18 ENCOUNTER — Encounter: Payer: Self-pay | Admitting: Family Medicine

## 2018-11-18 DIAGNOSIS — M2550 Pain in unspecified joint: Secondary | ICD-10-CM

## 2018-11-18 DIAGNOSIS — H16229 Keratoconjunctivitis sicca, not specified as Sjogren's, unspecified eye: Secondary | ICD-10-CM

## 2018-11-18 DIAGNOSIS — R768 Other specified abnormal immunological findings in serum: Secondary | ICD-10-CM

## 2018-11-18 DIAGNOSIS — M47812 Spondylosis without myelopathy or radiculopathy, cervical region: Secondary | ICD-10-CM | POA: Insufficient documentation

## 2018-11-18 DIAGNOSIS — M3501 Sicca syndrome with keratoconjunctivitis: Secondary | ICD-10-CM | POA: Insufficient documentation

## 2018-11-18 DIAGNOSIS — M3500 Sicca syndrome, unspecified: Secondary | ICD-10-CM

## 2018-11-18 DIAGNOSIS — M5136 Other intervertebral disc degeneration, lumbar region: Secondary | ICD-10-CM | POA: Insufficient documentation

## 2018-11-18 DIAGNOSIS — M5030 Other cervical disc degeneration, unspecified cervical region: Secondary | ICD-10-CM

## 2018-11-18 HISTORY — DX: Other specified abnormal immunological findings in serum: R76.8

## 2018-11-18 HISTORY — DX: Spondylosis without myelopathy or radiculopathy, cervical region: M47.812

## 2018-11-18 HISTORY — DX: Sjogren syndrome, unspecified: M35.00

## 2018-11-18 HISTORY — DX: Pain in unspecified joint: M25.50

## 2018-11-18 HISTORY — DX: Keratoconjunctivitis sicca, not specified as Sjogren's, unspecified eye: H16.229

## 2018-11-18 HISTORY — DX: Other cervical disc degeneration, unspecified cervical region: M50.30

## 2018-11-18 NOTE — Progress Notes (Signed)
Initial consultation note [facility] - Marella Chimes, PA-C on 11/14/2018 Assessment 1 Fibromyalgia 2. Polyarthralgia 3. DDD, cervical 4. DDD, Lumbar 5. Positive ANA 6. Sicca syndrome  Pains primarily of muscular origin.   Possible side effects from statin.   Checking ACCP serum level,  May send to ENT for Lip biopsy to look for Sjorgren's Given Rheumate samples  RTC 4 weeks monitor response vitamin therapy, being off statins, and discuss lip biopsy.

## 2018-11-25 ENCOUNTER — Other Ambulatory Visit: Payer: Self-pay | Admitting: Rheumatology

## 2018-11-25 DIAGNOSIS — M797 Fibromyalgia: Secondary | ICD-10-CM

## 2018-11-25 NOTE — Telephone Encounter (Signed)
Last visit: 09/14/18 Next Visit: 01/22/19  Okay to refill Ambien?

## 2018-11-26 NOTE — Telephone Encounter (Signed)
ok 

## 2018-12-08 ENCOUNTER — Other Ambulatory Visit: Payer: Self-pay | Admitting: Neurology

## 2018-12-27 DIAGNOSIS — M5030 Other cervical disc degeneration, unspecified cervical region: Secondary | ICD-10-CM | POA: Insufficient documentation

## 2019-01-02 ENCOUNTER — Other Ambulatory Visit: Payer: Self-pay | Admitting: Rheumatology

## 2019-01-02 DIAGNOSIS — F3289 Other specified depressive episodes: Secondary | ICD-10-CM

## 2019-01-02 NOTE — Telephone Encounter (Signed)
Last visit: 09/14/18 Next Visit: 01/21/19  Okay to refill per Dr. Estanislado Pandy

## 2019-01-03 ENCOUNTER — Other Ambulatory Visit: Payer: Self-pay | Admitting: Rheumatology

## 2019-01-03 DIAGNOSIS — M797 Fibromyalgia: Secondary | ICD-10-CM

## 2019-01-06 NOTE — Telephone Encounter (Signed)
Last Visit: 07/24/2018 Next Visit: 01/21/2019  Last fill: 11/26/2018  Okay to refill Lorrin Mais?

## 2019-01-06 NOTE — Telephone Encounter (Signed)
ok 

## 2019-01-21 ENCOUNTER — Ambulatory Visit: Payer: Self-pay | Admitting: Rheumatology

## 2019-01-21 NOTE — Progress Notes (Deleted)
Office Visit Note  Patient: Molly Giles             Date of Birth: 04-03-1956           MRN: [id number]             PCP: McDiarmid, Blane Ohara, MD Referring: McDiarmid, Blane Ohara, MD Visit Date: 01/28/2019 Occupation: @GUAROCC @  Subjective:  No chief complaint on file.   History of Present Illness: Molly Giles is a 63 y.o. female ***   Activities of Daily Living:  Patient reports morning stiffness for *** {minute/hour:19697}.   Patient {ACTIONS;DENIES/REPORTS:21021675::"Denies"} nocturnal pain.  Difficulty dressing/grooming: {ACTIONS;DENIES/REPORTS:21021675::"Denies"} Difficulty climbing stairs: {ACTIONS;DENIES/REPORTS:21021675::"Denies"} Difficulty getting out of chair: {ACTIONS;DENIES/REPORTS:21021675::"Denies"} Difficulty using hands for taps, buttons, cutlery, and/or writing: {ACTIONS;DENIES/REPORTS:21021675::"Denies"}  No Rheumatology ROS completed.   PMFS History:  Patient Active Problem List   Diagnosis Date Noted   Pain in joint, multiple sites 11/18/2018   DDD (degenerative disc disease), cervical 11/18/2018   DDD (degenerative disc disease), lumbar 11/18/2018   Positive ANA (antinuclear antibody) 11/18/2018   Sicca syndrome ([place]) 11/18/2018   History of colonic polyps 09/30/2018   Chronic contact dermatitis 08/06/2018   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[place]) 03/10/2018   Chronic migraine without aura, with intractable migraine, so stated, with status migrainosus 11/28/2017   URI (upper respiratory infection) 08/14/2017   Fatigue 07/05/2017   Insulin resistance 07/04/2017   Vitamin D deficiency 05/08/2017   Class 1 obesity without serious comorbidity with body mass index (BMI) of 30.0 to 30.9 in adult 04/24/2017   Vasomotor symptoms due to menopause 04/19/2017   Periodic limb movement sleep disorder 03/28/2017   Nocturnal hypoxemia 03/06/2017   Morbid obesity ([place]) 03/06/2017   Snoring 03/06/2017   Gasping for breath 03/06/2017   Sleep  walking and eating 03/06/2017   Episodic cluster headache, not intractable 03/06/2017   Obesity (BMI 30.0-34.9) 01/29/2017   Other insomnia 11/09/2016   S/P left THA, AA 07/25/2016   Irritable bowel syndrome with diarrhea 04/03/2016   Chronic migraine 02/25/2016   Left ventricular hypertrophy, mild 02/25/2016   History of irritable bowel syndrome 02/15/2016   Possible 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   Osteoarthritis of left hip 11/01/2015   Hearing loss of both ears 07/27/2015   Post concussive syndrome 07/14/2015   Depression (NOS) 07/02/2015   Allergic rhinoconjunctivitis 07/02/2015   Fibromyalgia syndrome 07/01/2015   History of Clostridium difficile colitis, persistent 07/01/2015   Impairment of balance 02/10/2015   Essential hypertension 05/15/2013   Hypothyroidism 05/15/2013   Hyperlipidemia 05/15/2013   Interstitial cystitis 05/15/2013   Chronic migraine without aura 05/15/2013   Spondylosis of lumbar region without myelopathy or radiculopathy 10/30/2011    Past Medical History:  Diagnosis Date   Allergic rhinoconjunctivitis 07/02/2015   Arthritis    Benign positional vertigo 04/2015   Responded well to Vestibular Rehab   Bruxism (teeth grinding)    Chronic migraine 02/25/2016   Per Dr Jaynee Eagles review in notes from [facility] from September 2015. Showed total headache days last month 18. Severe headache days 7 days. Moderate headache days 5 days. Mild headache days last month sick days. Days without headache last month 10 days. Symptoms associated with photophobia, phonophobia, osmophobia, neck pain, dizziness, jaw pain, nasal congestion, vision disturbances, tingling and numbness, weakness and worsening with activity. Each headache attack last 3 hours depending on treatment in severity. Left side, the right side, easier side, the frontal area in the back of the head. Characterized as  throbbing, pressure, tightness, squeezing,  stabbing and burning    Chronic migraine w/o aura w/o status migrainosus, not intractable 07/14/2015   DDD (degenerative disc disease), lumbar    Degenerative cervical disc    Depression    Disc displacement, lumbar    Dysrhythmia    seen by dr Wynonia Lawman- not a problem since she has been on Bystolic   Essential hypertension 05/15/2013   Family history of adverse reaction to anesthesia    Brother- N/V   Family history of premature CAD 05/15/2013   Fibromyalgia    GERD (gastroesophageal reflux disease) 12/16/2014   H/O seasonal allergies    Hammer toe    Left great toe   Hearing loss of both ears 07/27/2015   mild to borderline moderate low frequency hearing loss improving to within normal limits bilaterally on audiology testing at [facility] in November 2016.     History of Clostridium difficile colitis 07/01/2015   Required Fecal Transplantation tocure   History of colonic polyps    History of irritable bowel syndrome 02/15/2016   History of migraine headaches 05/15/2013   Per Dr Jaynee Eagles review in notes from [facility] from September 2015. Showed total headache days last month 18. Severe headache days 7 days. Moderate headache days 5 days. Mild headache days last month sick days. Days without headache last month 10 days. Symptoms associated with photophobia, phonophobia, osmophobia, neck pain, dizziness, jaw pain, nasal congestion, vision disturbances, tingling and numbness, weakness and worsening with activity. Each headache attack last 3 hours depending on treatment in severity. Left side, the right side, easier side, the frontal area in the back of the head. Characterized as throbbing, pressure, tightness, squeezing, stabbing and burning     Hx of bad fall 02/2015   Severe Facial/head trauma without fracture   Hyperlipidemia 1998   Hypertension 2004   Hypothyroidism    Impairment of balance 02/2015   Consequent of postconcussive syndrome    Interstitial cystitis    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[place]) 03/10/2018   [facility]   Lumbar facet joint pain    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   Migraine 1962   Chronic Migraines   Mood disorder ([place])    Musculoskeletal neck pain 07/14/2015   Normal coronary arteries 05/14/2014   Osteoarthritis of left hip 11/01/2015   MRI order by Dr Alvan Dame (ortho) 10/2015 showed significant arthritis of left hip joint with cystic changes in femoral head c/w osteoarthritis   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, lumbar region 10/30/2011   Overweight    Pain in joint of left shoulder 11/09/2016   Palpitations 05/15/2013   [facility] manages   Periodic limb movement sleep disorder 03/28/2017   Perirectal cyst 05/07/2016   PONV (postoperative nausea and vomiting)    Post concussion syndrome 06/06/2015   Post concussive syndrome 07/14/2015   Ms Simonetti's post-concussive syndrome manifesting in vertigo and headache, mood changes, poor balance, dizziness, and decreased concentration per Dr Jaynee Eagles at [facility].    Posterior vitreous detachment of right eye 2014   Shingles    Shortness of breath dyspnea    with exertion   Thyroid nodule 08/11/2009   Findings: The thyroid gland is within normal limits in size.  The gland is diffusely inhomogeneous. A small solid nodule is noted in the lower pole  medially on the right of 7 x 6 x 8 mm. A small solid nodule is noted inferiorly on the left of 3 x 3  x 4 mm.  IMPRESSION:  The thyroid gland is within normal limits in size with only small solid nodules present, the largest of only 8 mm in diameter on the right.     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    Vasomotor symptoms due to menopause 04/19/2017    Family History  Problem Relation Age of Onset   Alzheimer's disease Mother    Hyperlipidemia Mother    Hypertension Mother    Osteoporosis  Mother    Parkinson's disease Mother    Migraines Sister    Allergies Sister    Hypertension Sister    Hyperlipidemia Brother    Cardiomyopathy Brother    Diabetes type II Brother    Kidney disease Brother    Asthma Brother    Heart disease Brother    Hypertension Brother    Hyperlipidemia Brother    Kidney disease Brother    Heart disease Father    Hyperlipidemia Father    Hypertension Father    Aortic aneurysm Father    Early death Father 44   Diabetes type II Other    Breast cancer Maternal Aunt    Past Surgical History:  Procedure Laterality Date   Bladder dilitation     x 3   BREAST BIOPSY Right 2011   Benign histology   CARDIOVASCULAR STRESS TEST  2000   Unremarkable per pt report   CARPOMETACARPAL JOINT ARTHROTOMY Right 2011   COLONOSCOPY     COLONOSCOPY WITH PROPOFOL N/A 04/21/2015   Procedure: COLONOSCOPY WITH PROPOFOL;  Surgeon: Carol Ada, MD;  Location: [facility] ENDOSCOPY;  Service: Endoscopy;  Laterality: N/A;   EPIDURAL BLOCK INJECTION Left 04/12/2016   Left Medial Nerve Block and Left L5 ramus block, Dr Suella Broad    EPIDURAL BLOCK INJECTION  03/21/2016   Left L3-4 medial branch block and Left L5 & dorsal ramus block    EPIDURAL BLOCK INJECTION N/A 10/25/2016   Suella Broad, MD. Lumbar medial branch block   EPIDURAL BLOCK INJECTION N/A 02/09/2017   Suella Broad, MD.  Bilateral L3/4 medial branch block, bilateral L5 dorsal ramus block   EPIDURAL BLOCK INJECTION N/A 07/04/2017   Suella Broad, MD   FECAL TRANSPLANT  04/21/2015   Procedure: FECAL TRANSPLANT;  Surgeon: Carol Ada, MD;  Location: [facility] ENDOSCOPY;  Service: Endoscopy;;   HIP ARTHROPLASTY Left    HIP ARTHROSCOPY Left 03/06/2018   Left hip arthroplasty, redo for loose hip arthroplasty. Procedure at [facility] Left 11/2015   for OA by Dr Suella Broad   OTHER SURGICAL HISTORY Left 2016   Left L3/L4 medial nerve block  and Left L5 Dorsal Ramus block Dr Mickel Duhamel   TOTAL HIP ARTHROPLASTY Left 07/25/2016   Procedure: LEFT TOTAL HIP ARTHROPLASTY ANTERIOR APPROACH;  Surgeon: Paralee Cancel, MD;  Location: [facility];  Service: Orthopedics;  Laterality: Left;   Social History   Social History Narrative   Prior PCP With [facility] at [place], [place].   Married, lives with [place], (b. 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    Wears seatbelt usually   No religious beliefs affecting healthcare   No difficulty taking medications as directed.       Home has working smoke alarm   No home throw rugs   Does not have nonslip bathtub / shower  Has railings on all stairs   Home is free from [place].    Right-handed.      No history of Hospitalization as of 07/2016.       Best number to reach patient [phone] (M) as of 07/01/15   It is permissible to leave messages as of 07/01/15 .    No regular exercise of 3 times a week for 30 minutes at a time      Sanjuana Mruk has Advanced Directive and a Doctor, general practice is husband, Sora Vrooman [phone]      Caffeine: 3 cups coffee per day, sometimes 2-3 cups of tea   Immunization History  Administered Date(s) Administered   Influenza,inj,Quad PF,6+ Mos 07/01/2015, 05/10/2017   Influenza-Unspecified 09/13/2016, 08/12/2018   Tdap 11/09/2011, 02/24/2016, 09/13/2016   Zoster Recombinat (Shingrix) 08/12/2018     Objective: Vital Signs: There were no vitals taken for this visit.   Physical Exam   Musculoskeletal Exam: ***  CDAI Exam: CDAI Score: Not documented Patient Global Assessment: Not documented; Provider Global Assessment: Not documented Swollen: Not documented; Tender: Not documented Joint Exam   Not documented   There is currently no information documented on the homunculus. Go to the Rheumatology activity and complete the homunculus joint  exam.  Investigation: No additional findings.  Imaging: No results found.  Recent Labs: Lab Results  Component Value Date   WBC 6.7 05/07/2018   HGB 12.6 05/07/2018   PLT 271 03/09/2018   NA 138 05/07/2018   K 4.4 05/07/2018   CL 102 05/07/2018   CO2 22 05/07/2018   GLUCOSE 85 05/07/2018   BUN 16 05/07/2018   CREATININE 0.80 05/07/2018   BILITOT 0.2 05/07/2018   ALKPHOS 79 05/07/2018   AST 16 05/07/2018   ALT 15 05/07/2018   PROT 6.7 05/07/2018   ALBUMIN 4.2 05/07/2018   CALCIUM 9.3 05/07/2018   GFRAA 91 05/07/2018    Speciality Comments: No specialty comments available.  Procedures:  No procedures performed Allergies: Sulfa antibiotics; Chlorhexidine gluconate; Codeine; Lyrica [pregabalin]; Methylprednisolone; Prednisone; Betadine [povidone iodine]; Latex; and Wellbutrin [bupropion]   Assessment / Plan:     Visit Diagnoses: No diagnosis found.   Orders: No orders of the defined types were placed in this encounter.  No orders of the defined types were placed in this encounter.   Face-to-face time spent with patient was *** minutes. Greater than 50% of time was spent in counseling and coordination of care.  Follow-Up Instructions: No follow-ups on file.   Earnestine Mealing, CMA  Note - This record has been created using Editor, commissioning.  Chart creation errors have been sought, but may not always  have been located. Such creation errors do not reflect on  the standard of medical care.

## 2019-01-22 ENCOUNTER — Ambulatory Visit: Payer: BLUE CROSS/BLUE SHIELD | Admitting: Rheumatology

## 2019-01-27 DIAGNOSIS — J3501 Chronic tonsillitis: Secondary | ICD-10-CM

## 2019-01-27 DIAGNOSIS — M3500 Sicca syndrome, unspecified: Secondary | ICD-10-CM

## 2019-01-27 HISTORY — DX: Chronic tonsillitis: J35.01

## 2019-01-27 HISTORY — DX: Sjogren syndrome, unspecified: M35.00

## 2019-01-28 ENCOUNTER — Ambulatory Visit: Payer: BLUE CROSS/BLUE SHIELD | Admitting: Physician Assistant

## 2019-01-28 NOTE — Progress Notes (Signed)
Office Visit Note  Patient: Molly Giles             Date of Birth: 27-Feb-1956           MRN: [id number]             PCP: McDiarmid, Blane Ohara, MD Referring: McDiarmid, Blane Ohara, MD Visit Date: 02/04/2019 Occupation: @GUAROCC @  Subjective:  Neck and lower back pain   History of Present Illness: Molly Giles is a 63 y.o. female with history of fibromyalgia, osteoarthritis, and DDD.  She takes Ambien 10 mg po at bedtime prn for insomnia.  She has been experiencing increased pain at night in her neck and lower back.  She has been seeing a physiatrist for her neck and lower back pain.  She has tried PT for her neck which did not help.  She is now seeing a chiropractor twice weekly.  She receives lower back injections every 3-4 months.  She is having increased muscle tension and tenderness, especially in trapezius muscles. She has left CMC joint pain, and she is planning on proceeding with surgery performed by Dr. Amedeo Plenty.  She has chronic fatigue since she has not been sleeping well at night due to the discomfort she experiences.  She has been walking more for exercise.     Activities of Daily Living:  Patient reports morning stiffness for 30-45 minutes.   Patient Reports nocturnal pain.  Difficulty dressing/grooming: Denies Difficulty climbing stairs: Reports Difficulty getting out of chair: Denies Difficulty using hands for taps, buttons, cutlery, and/or writing: Denies  Review of Systems  Constitutional: Positive for fatigue.  HENT: Positive for mouth dryness. Negative for mouth sores and nose dryness.   Eyes: Positive for dryness. Negative for pain and visual disturbance.  Respiratory: Negative for cough, hemoptysis, shortness of breath and difficulty breathing.   Cardiovascular: Negative for chest pain, palpitations, hypertension and swelling in legs/feet.  Gastrointestinal: Positive for diarrhea. Negative for blood in stool and constipation.  Endocrine: Negative for increased  urination.  Genitourinary: Negative for painful urination.  Musculoskeletal: Positive for arthralgias, joint pain, myalgias, muscle tenderness and myalgias. Negative for joint swelling, muscle weakness and morning stiffness.  Skin: Negative for color change, pallor, rash, hair loss, nodules/bumps, skin tightness, ulcers and sensitivity to sunlight.  Allergic/Immunologic: Negative for susceptible to infections.  Neurological: Positive for dizziness (HX of vertigo ). Negative for numbness, headaches and weakness.  Hematological: Negative for swollen glands.  Psychiatric/Behavioral: Positive for sleep disturbance. Negative for depressed mood. The patient is not nervous/anxious.     PMFS History:  Patient Active Problem List   Diagnosis Date Noted   Pain in joint, multiple sites 11/18/2018   DDD (degenerative disc disease), cervical 11/18/2018   DDD (degenerative disc disease), lumbar 11/18/2018   Positive ANA (antinuclear antibody) 11/18/2018   Sicca syndrome ([place]) 11/18/2018   History of colonic polyps 09/30/2018   Chronic contact dermatitis 08/06/2018   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[place]) 03/10/2018   Chronic migraine without aura, with intractable migraine, so stated, with status migrainosus 11/28/2017   URI (upper respiratory infection) 08/14/2017   Fatigue 07/05/2017   Insulin resistance 07/04/2017   Vitamin D deficiency 05/08/2017   Class 1 obesity without serious comorbidity with body mass index (BMI) of 30.0 to 30.9 in adult 04/24/2017   Vasomotor symptoms due to menopause 04/19/2017   Periodic limb movement sleep disorder 03/28/2017   Nocturnal hypoxemia 03/06/2017   Morbid obesity ([place]) 03/06/2017   Snoring 03/06/2017   Gasping  for breath 03/06/2017   Sleep walking and eating 03/06/2017   Episodic cluster headache, not intractable 03/06/2017   Obesity (BMI 30.0-34.9) 01/29/2017   Other insomnia 11/09/2016   S/P left THA, AA 07/25/2016   Irritable  bowel syndrome with diarrhea 04/03/2016   Chronic migraine 02/25/2016   Left ventricular hypertrophy, mild 02/25/2016   History of irritable bowel syndrome 02/15/2016   Possible 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   Osteoarthritis of left hip 11/01/2015   Hearing loss of both ears 07/27/2015   Post concussive syndrome 07/14/2015   Depression (NOS) 07/02/2015   Allergic rhinoconjunctivitis 07/02/2015   Fibromyalgia syndrome 07/01/2015   History of Clostridium difficile colitis, persistent 07/01/2015   Impairment of balance 02/10/2015   Essential hypertension 05/15/2013   Hypothyroidism 05/15/2013   Hyperlipidemia 05/15/2013   Interstitial cystitis 05/15/2013   Chronic migraine without aura 05/15/2013   Spondylosis of lumbar region without myelopathy or radiculopathy 10/30/2011    Past Medical History:  Diagnosis Date   Allergic rhinoconjunctivitis 07/02/2015   Arthritis    Benign positional vertigo 04/2015   Responded well to Vestibular Rehab   Bruxism (teeth grinding)    Chronic migraine 02/25/2016   Per Dr Jaynee Eagles review in notes from [facility] from September 2015. Showed total headache days last month 18. Severe headache days 7 days. Moderate headache days 5 days. Mild headache days last month sick days. Days without headache last month 10 days. Symptoms associated with photophobia, phonophobia, osmophobia, neck pain, dizziness, jaw pain, nasal congestion, vision disturbances, tingling and numbness, weakness and worsening with activity. Each headache attack last 3 hours depending on treatment in severity. Left side, the right side, easier side, the frontal area in the back of the head. Characterized as throbbing, pressure, tightness, squeezing, stabbing and burning    Chronic migraine w/o aura w/o status migrainosus, not intractable 07/14/2015   DDD (degenerative disc disease), lumbar    Degenerative cervical disc    Depression     Disc displacement, lumbar    Dysrhythmia    seen by dr Wynonia Lawman- not a problem since she has been on Bystolic   Essential hypertension 05/15/2013   Family history of adverse reaction to anesthesia    Brother- N/V   Family history of premature CAD 05/15/2013   Fibromyalgia    GERD (gastroesophageal reflux disease) 12/16/2014   H/O seasonal allergies    Hammer toe    Left great toe   Hearing loss of both ears 07/27/2015   mild to borderline moderate low frequency hearing loss improving to within normal limits bilaterally on audiology testing at [facility] in November 2016.     History of Clostridium difficile colitis 07/01/2015   Required Fecal Transplantation tocure   History of colonic polyps    History of irritable bowel syndrome 02/15/2016   History of migraine headaches 05/15/2013   Per Dr Jaynee Eagles review in notes from [facility] from September 2015. Showed total headache days last month 18. Severe headache days 7 days. Moderate headache days 5 days. Mild headache days last month sick days. Days without headache last month 10 days. Symptoms associated with photophobia, phonophobia, osmophobia, neck pain, dizziness, jaw pain, nasal congestion, vision disturbances, tingling and numbness, weakness and worsening with activity. Each headache attack last 3 hours depending on treatment in severity. Left side, the right side, easier side, the frontal area in the back of the head. Characterized as throbbing, pressure, tightness, squeezing, stabbing and burning  Hx of bad fall 02/2015   Severe Facial/head trauma without fracture   Hyperlipidemia 1998   Hypertension 2004   Hypothyroidism    Impairment of balance 02/2015   Consequent of postconcussive syndrome   Interstitial cystitis    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[place]) 03/10/2018   [facility]   Lumbar facet joint pain    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   Migraine 1962   Chronic Migraines    Mood disorder ([place])    Musculoskeletal neck pain 07/14/2015   Normal coronary arteries 05/14/2014   Osteoarthritis of left hip 11/01/2015   MRI order by Dr Alvan Dame (ortho) 10/2015 showed significant arthritis of left hip joint with cystic changes in femoral head c/w osteoarthritis   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, lumbar region 10/30/2011   Overweight    Pain in joint of left shoulder 11/09/2016   Palpitations 05/15/2013   [facility] manages   Periodic limb movement sleep disorder 03/28/2017   Perirectal cyst 05/07/2016   PONV (postoperative nausea and vomiting)    Post concussion syndrome 06/06/2015   Post concussive syndrome 07/14/2015   Ms Koplin's post-concussive syndrome manifesting in vertigo and headache, mood changes, poor balance, dizziness, and decreased concentration per Dr Jaynee Eagles at [facility].    Posterior vitreous detachment of right eye 2014   Shingles    Shortness of breath dyspnea    with exertion   Thyroid nodule 08/11/2009   Findings: The thyroid gland is within normal limits in size.  The gland is diffusely inhomogeneous. A small solid nodule is noted in the lower pole  medially on the right of 7 x 6 x 8 mm. A small solid nodule is noted inferiorly on the left of 3 x 3 x 4 mm.  IMPRESSION:  The thyroid gland is within normal limits in size with only small solid nodules present, the largest of only 8 mm in diameter on the right.     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    Vasomotor symptoms due to menopause 04/19/2017    Family History  Problem Relation Age of Onset   Alzheimer's disease Mother    Hyperlipidemia Mother    Hypertension Mother    Osteoporosis Mother    Giles's disease Mother    Migraines Sister    Allergies Sister    Hypertension Sister    Hyperlipidemia Brother    Cardiomyopathy Brother    Diabetes type II Brother    Kidney  disease Brother    Asthma Brother    Heart disease Brother    Hypertension Brother    Hyperlipidemia Brother    Kidney disease Brother    Heart disease Father    Hyperlipidemia Father    Hypertension Father    Aortic aneurysm Father    Early death Father 39   Diabetes type II Other    Breast cancer Maternal Aunt    Past Surgical History:  Procedure Laterality Date   Bladder dilitation     x 3   BREAST BIOPSY Right 2011   Benign histology   CARDIOVASCULAR STRESS TEST  2000   Unremarkable per pt report   CARPOMETACARPAL JOINT ARTHROTOMY Right 2011   COLONOSCOPY     COLONOSCOPY WITH PROPOFOL N/A 04/21/2015   Procedure: COLONOSCOPY WITH PROPOFOL;  Surgeon: Carol Ada, MD;  Location: [facility] ENDOSCOPY;  Service: Endoscopy;  Laterality: N/A;   EPIDURAL BLOCK INJECTION Left 04/12/2016  Left Medial Nerve Block and Left L5 ramus block, Dr Suella Broad    EPIDURAL BLOCK INJECTION  03/21/2016   Left L3-4 medial branch block and Left L5 & dorsal ramus block    EPIDURAL BLOCK INJECTION N/A 10/25/2016   Suella Broad, MD. Lumbar medial branch block   EPIDURAL BLOCK INJECTION N/A 02/09/2017   Suella Broad, MD.  Bilateral L3/4 medial branch block, bilateral L5 dorsal ramus block   EPIDURAL BLOCK INJECTION N/A 07/04/2017   Suella Broad, MD   FECAL TRANSPLANT  04/21/2015   Procedure: FECAL TRANSPLANT;  Surgeon: Carol Ada, MD;  Location: [facility] ENDOSCOPY;  Service: Endoscopy;;   HIP ARTHROPLASTY Left    HIP ARTHROSCOPY Left 03/06/2018   Left hip arthroplasty, redo for loose hip arthroplasty. Procedure at [place] Left 11/2015   for OA by Dr Suella Broad   OTHER SURGICAL HISTORY Left 2016   Left L3/L4 medial nerve block and Left L5 Dorsal Ramus block Dr Mickel Duhamel   TOTAL HIP ARTHROPLASTY Left 07/25/2016   Procedure: LEFT TOTAL HIP ARTHROPLASTY ANTERIOR APPROACH;  Surgeon: Paralee Cancel, MD;  Location: [facility];  Service:  Orthopedics;  Laterality: Left;   Social History   Social History Narrative   Prior PCP With [facility] at [place], [place].   Married, lives with [place], (b. 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    Wears seatbelt usually   No religious beliefs affecting healthcare   No difficulty taking medications as directed.       Home has working smoke alarm   No home throw rugs   Does not have nonslip bathtub / shower    Has railings on all stairs   Home is free from [place].    Right-handed.      No history of Hospitalization as of 07/2016.       Best number to reach patient [phone] (M) as of 07/01/15   It is permissible to leave messages as of 07/01/15 .    No regular exercise of 3 times a week for 30 minutes at a time      Gitty Osterlund has Advanced Directive and a Doctor, general practice is husband, Dellene Mcgroarty [phone]      Caffeine: 3 cups coffee per day, sometimes 2-3 cups of tea   Immunization History  Administered Date(s) Administered   Influenza,inj,Quad PF,6+ Mos 07/01/2015, 05/10/2017   Influenza-Unspecified 09/13/2016, 08/12/2018   Tdap 11/09/2011, 02/24/2016, 09/13/2016   Zoster Recombinat (Shingrix) 08/12/2018     Objective: Vital Signs: BP 122/74 (BP Location: Left Arm, Patient Position: Sitting, Cuff Size: Normal)    Pulse 71    Resp 12    Ht 5' 6.5" (1.689 m)    Wt 203 lb (92.1 kg)    BMI 32.27 kg/m    Physical Exam Vitals signs and nursing note reviewed.  Constitutional:      Appearance: She is well-developed.  HENT:     Head: Normocephalic and atraumatic.  Eyes:     Conjunctiva/sclera: Conjunctivae normal.  Neck:     Musculoskeletal: Normal range of motion.  Cardiovascular:     Rate and Rhythm: Normal rate and regular rhythm.     Heart sounds: Normal heart sounds.  Pulmonary:     Effort: Pulmonary effort is normal.     Breath sounds: Normal breath  sounds.  Abdominal:  General: Bowel sounds are normal.     Palpations: Abdomen is soft.  Lymphadenopathy:     Cervical: No cervical adenopathy.  Skin:    General: Skin is warm and dry.     Capillary Refill: Capillary refill takes less than 2 seconds.  Neurological:     Mental Status: She is alert and oriented to person, place, and time.  Psychiatric:        Behavior: Behavior normal.      Musculoskeletal Exam: C-spine, thoracic spine, and lumbar spine good ROM. Thoracic midline spinal tenderness.  No SI joint tenderness.  Shoulder joints, elbow joints, wrist joints, MCPs, PIPs, and DIPs good ROM with no synovitis.  DIP synovial thickening.  Left CMC joint synovial thickening and subluxation.  Hip joints, knee joints, ankle joints, MTPs, PIPs, and DIPs good ROM with no synovitis.  No warmth or effusion of knee joints.  No tenderness or swelling of ankle joints.    CDAI Exam: CDAI Score: Not documented Patient Global Assessment: Not documented; Provider Global Assessment: Not documented Swollen: Not documented; Tender: Not documented Joint Exam   Not documented   There is currently no information documented on the homunculus. Go to the Rheumatology activity and complete the homunculus joint exam.  Investigation: No additional findings.  Imaging: No results found.  Recent Labs: Lab Results  Component Value Date   WBC 6.7 05/07/2018   HGB 12.6 05/07/2018   PLT 271 03/09/2018   NA 138 05/07/2018   K 4.4 05/07/2018   CL 102 05/07/2018   CO2 22 05/07/2018   GLUCOSE 85 05/07/2018   BUN 16 05/07/2018   CREATININE 0.80 05/07/2018   BILITOT 0.2 05/07/2018   ALKPHOS 79 05/07/2018   AST 16 05/07/2018   ALT 15 05/07/2018   PROT 6.7 05/07/2018   ALBUMIN 4.2 05/07/2018   CALCIUM 9.3 05/07/2018   GFRAA 91 05/07/2018    Speciality Comments: No specialty comments available.  Procedures:  No procedures performed Allergies: Sulfa antibiotics; Chlorhexidine gluconate;  Codeine; Lyrica [pregabalin]; Methylprednisolone; Prednisone; Betadine [povidone iodine]; Latex; and Wellbutrin [bupropion]   Assessment / Plan:     Visit Diagnoses: Fibromyalgia: She has generalized muscle aches and muscle tenderness due to fibromyalgia.  She has been experiencing increased trapezius muscle tension and muscle spasms.  She has had worsening neck and lower back pain due to DDD which has exacerbated her fibromyalgia pain.  She has been having difficulty sleeping at night due to the discomfort she experiences.  Her fatigue has been worsening.  We discussed the importance of regular exercise and good sleep hygiene.  She will follow up in 6 months.  Other insomnia: She takes Ambien 10 mg by mouth at bedtime.  She requested a refill today.  She has been having difficulty sleeping at night due to the neck pain and lower back pain she has been experiencing.  Other fatigue: She has been experiencing increased fatigue and daytime drowsiness.  She has not been sleeping well at night due to the neck pain and lower back pain she has been experiencing.  We discussed the importance of good sleep hygiene.    Primary osteoarthritis of both hands: She has DIP synovial thickening.  Left CMC joint synovial thickening and subluxation.  She has no synovitis or tenderness.   Trochanteric bursitis of left hip: She has no tenderness at this time.  Status post left hip replacement: Doing well.  She has good ROM.  She has difficulty climbing steps at times.  DDD (degenerative disc disease), cervical: Chronic pain.  She has good range of motion with some discomfort.  She has no symptoms of radiculopathy.  She went to physical therapy without denies any improvement.  She started going to a chiropractor twice a week.  We discussed neck exercises to try.  She was given a handout of exercises.   DDD (degenerative disc disease), lumbar: Chronic pain.  She has good range of motion.  She has no midline spinal  tenderness of the lumbar region.  She has no symptoms of radiculopathy at this time.  She has injections every 3 to 4 months.  We discussed the importance of core strengthening.  She was given a handout of back exercises and core strengthening exercises. Some of these exercises were demonstrated.   Chronic pain syndrome: She takes Mobic 15 mg po daily.  Vitamin D deficiency: She takes a vitamin D supplement.   Other depression  History of hyperlipidemia  IC (interstitial cystitis)  History of migraine  History of hypertension  History of hypothyroidism   Orders: No orders of the defined types were placed in this encounter.  No orders of the defined types were placed in this encounter.   Face-to-face time spent with patient was 30 minutes. Greater than 50% of time was spent in counseling and coordination of care.  Follow-Up Instructions: Return in about 6 months (around 08/07/2019) for Fibromyalgia, Osteoarthritis, DDD.   Ofilia Neas, PA-C   I examined and evaluated the patient with Molly Sams PA.  Patient continues to have discomfort from fibromyalgia, disc disease and osteoarthritis.  We had detailed discussion regarding exercises for core strengthening and thoracic and lumbar spine.  The exercises were demonstrated in the office.  The plan of care was discussed as noted above.  Bo Merino, MD  Note - This record has been created using Editor, commissioning.  Chart creation errors have been sought, but may not always  have been located. Such creation errors do not reflect on  the standard of medical care.

## 2019-02-04 ENCOUNTER — Other Ambulatory Visit: Payer: Self-pay

## 2019-02-04 ENCOUNTER — Ambulatory Visit: Payer: BLUE CROSS/BLUE SHIELD | Admitting: Rheumatology

## 2019-02-04 ENCOUNTER — Telehealth: Payer: Self-pay | Admitting: Rheumatology

## 2019-02-04 ENCOUNTER — Encounter: Payer: Self-pay | Admitting: Physician Assistant

## 2019-02-04 VITALS — BP 122/74 | HR 71 | Resp 12 | Ht 66.5 in | Wt 203.0 lb

## 2019-02-04 DIAGNOSIS — M7062 Trochanteric bursitis, left hip: Secondary | ICD-10-CM

## 2019-02-04 DIAGNOSIS — M5030 Other cervical disc degeneration, unspecified cervical region: Secondary | ICD-10-CM

## 2019-02-04 DIAGNOSIS — G4709 Other insomnia: Secondary | ICD-10-CM | POA: Diagnosis not present

## 2019-02-04 DIAGNOSIS — M5136 Other intervertebral disc degeneration, lumbar region: Secondary | ICD-10-CM

## 2019-02-04 DIAGNOSIS — R5383 Other fatigue: Secondary | ICD-10-CM | POA: Diagnosis not present

## 2019-02-04 DIAGNOSIS — Z8639 Personal history of other endocrine, nutritional and metabolic disease: Secondary | ICD-10-CM

## 2019-02-04 DIAGNOSIS — M542 Cervicalgia: Secondary | ICD-10-CM | POA: Diagnosis not present

## 2019-02-04 DIAGNOSIS — F3289 Other specified depressive episodes: Secondary | ICD-10-CM

## 2019-02-04 DIAGNOSIS — M51369 Other intervertebral disc degeneration, lumbar region without mention of lumbar back pain or lower extremity pain: Secondary | ICD-10-CM

## 2019-02-04 DIAGNOSIS — M19041 Primary osteoarthritis, right hand: Secondary | ICD-10-CM

## 2019-02-04 DIAGNOSIS — Z8679 Personal history of other diseases of the circulatory system: Secondary | ICD-10-CM

## 2019-02-04 DIAGNOSIS — Z8669 Personal history of other diseases of the nervous system and sense organs: Secondary | ICD-10-CM

## 2019-02-04 DIAGNOSIS — E559 Vitamin D deficiency, unspecified: Secondary | ICD-10-CM

## 2019-02-04 DIAGNOSIS — Z96642 Presence of left artificial hip joint: Secondary | ICD-10-CM

## 2019-02-04 DIAGNOSIS — M797 Fibromyalgia: Secondary | ICD-10-CM | POA: Diagnosis not present

## 2019-02-04 DIAGNOSIS — N3010 Interstitial cystitis (chronic) without hematuria: Secondary | ICD-10-CM

## 2019-02-04 DIAGNOSIS — G894 Chronic pain syndrome: Secondary | ICD-10-CM

## 2019-02-04 DIAGNOSIS — M19042 Primary osteoarthritis, left hand: Secondary | ICD-10-CM

## 2019-02-04 MED ORDER — ZOLPIDEM TARTRATE 10 MG PO TABS
ORAL_TABLET | ORAL | 0 refills | Status: DC
Start: 2019-02-04 — End: 2019-03-21

## 2019-02-04 NOTE — Patient Instructions (Signed)
Neck Exercises Neck exercises can be important for many reasons:  They can help you to improve and maintain flexibility in your neck. This can be especially important as you age.  They can help to make your neck stronger. This can make movement easier.  They can reduce or prevent neck pain.  They may help your upper back. Ask your health care provider which neck exercises would be best for you. Exercises to improve neck flexibility Neck stretch Repeat this exercise 3-5 times. 1. Do this exercise while standing or while sitting in a chair. 2. Place your feet flat on the floor, shoulder-width apart. 3. Slowly turn your head to the right. Turn it all the way to the right so you can look over your right shoulder. Do not tilt or tip your head. 4. Hold this position for 10-30 seconds. 5. Slowly turn your head to the left, to look over your left shoulder. 6. Hold this position for 10-30 seconds.  Neck retraction Repeat this exercise 8-10 times. Do this 3-4 times a day or as told by your health care provider. 1. Do this exercise while standing or while sitting in a sturdy chair. 2. Look straight ahead. Do not bend your neck. 3. Use your fingers to push your chin backward. Do not bend your neck for this movement. Continue to face straight ahead. If you are doing the exercise properly, you will feel a slight sensation in your throat and a stretch at the back of your neck. 4. Hold the stretch for 1-2 seconds. Relax and repeat. Exercises to improve neck strength Neck press Repeat this exercise 10 times. Do it first thing in the morning and right before bed or as told by your health care provider. 1. Lie on your back on a firm bed or on the floor with a pillow under your head. 2. Use your neck muscles to push your head down on the pillow and straighten your spine. 3. Hold the position as well as you can. Keep your head facing up and your chin tucked. 4. Slowly count to 5 while holding this  position. 5. Relax for a few seconds. Then repeat. Isometric strengthening Do a full set of these exercises 2 times a day or as told by your health care provider. 1. Sit in a supportive chair and place your hand on your forehead. 2. Push forward with your head and neck while pushing back with your hand. Hold for 10 seconds. 3. Relax. Then repeat the exercise 3 times. 4. Next, do thesequence again, this time putting your hand against the back of your head. Use your head and neck to push backward against the hand pressure. 5. Finally, do the same exercise on either side of your head, pushing sideways against the pressure of your hand. Prone head lifts Repeat this exercise 5 times. Do this 2 times a day or as told by your health care provider. 1. Lie face-down, resting on your elbows so that your chest and upper back are raised. 2. Start with your head facing downward, near your chest. Position your chin either on or near your chest. 3. Slowly lift your head upward. Lift until you are looking straight ahead. Then continue lifting your head as far back as you can stretch. 4. Hold your head up for 5 seconds. Then slowly lower it to your starting position. Supine head lifts Repeat this exercise 8-10 times. Do this 2 times a day or as told by your health care provider. 1. Molly Giles  on your back, bending your knees to point to the ceiling and keeping your feet flat on the floor. 2. Lift your head slowly off the floor, raising your chin toward your chest. 3. Hold for 5 seconds. 4. Relax and repeat. Scapular retraction Repeat this exercise 5 times. Do this 2 times a day or as told by your health care provider. 1. Stand with your arms at your sides. Look straight ahead. 2. Slowly pull both shoulders backward and downward until you feel a stretch between your shoulder blades in your upper back. 3. Hold for 10-30 seconds. 4. Relax and repeat. Contact a health care provider if:  Your neck pain or  discomfort gets much worse when you do an exercise.  Your neck pain or discomfort does not improve within 2 hours after you exercise. If you have any of these problems, stop exercising right away. Do not do the exercises again unless your health care provider says that you can. Get help right away if:  You develop sudden, severe neck pain. If this happens, stop exercising right away. Do not do the exercises again unless your health care provider says that you can. This information is not intended to replace advice given to you by your health care provider. Make sure you discuss any questions you have with your health care provider. Document Released: 08/09/2015 Document Revised: 01/01/2018 Document Reviewed: 03/08/2015 Elsevier Interactive Patient Education  2019 [place]. Back Exercises If you have pain in your back, do these exercises 2-3 times each day or as told by your doctor. When the pain goes away, do the exercises once each day, but repeat the steps more times for each exercise (do more repetitions). If you do not have pain in your back, do these exercises once each day or as told by your doctor. Exercises Single Knee to Chest Do these steps 3-5 times in a row for each leg: 1. Lie on your back on a firm bed or the floor with your legs stretched out. 2. Bring one knee to your chest. 3. Hold your knee to your chest by grabbing your knee or thigh. 4. Pull on your knee until you feel a gentle stretch in your lower back. 5. Keep doing the stretch for 10-30 seconds. 6. Slowly let go of your leg and straighten it. Pelvic Tilt Do these steps 5-10 times in a row: 1. Lie on your back on a firm bed or the floor with your legs stretched out. 2. Bend your knees so they point up to the ceiling. Your feet should be flat on the floor. 3. Tighten your lower belly (abdomen) muscles to press your lower back against the floor. This will make your tailbone point up to the ceiling instead of  pointing down to your feet or the floor. 4. Stay in this position for 5-10 seconds while you gently tighten your muscles and breathe evenly. Cat-Cow Do these steps until your lower back bends more easily: 1. Get on your hands and knees on a firm surface. Keep your hands under your shoulders, and keep your knees under your hips. You may put padding under your knees. 2. Let your head hang down, and make your tailbone point down to the floor so your lower back is round like the back of a cat. 3. Stay in this position for 5 seconds. 4. Slowly lift your head and make your tailbone point up to the ceiling so your back hangs low (sags) like the back of a cow.  5. Stay in this position for 5 seconds.  Press-Ups Do these steps 5-10 times in a row: 1. Lie on your belly (face-down) on the floor. 2. Place your hands near your head, about shoulder-width apart. 3. While you keep your back relaxed and keep your hips on the floor, slowly straighten your arms to raise the top half of your body and lift your shoulders. Do not use your back muscles. To make yourself more comfortable, you may change where you place your hands. 4. Stay in this position for 5 seconds. 5. Slowly return to lying flat on the floor.  Bridges Do these steps 10 times in a row: 1. Lie on your back on a firm surface. 2. Bend your knees so they point up to the ceiling. Your feet should be flat on the floor. 3. Tighten your butt muscles and lift your butt off of the floor until your waist is almost as high as your knees. If you do not feel the muscles working in your butt and the back of your thighs, slide your feet 1-2 inches farther away from your butt. 4. Stay in this position for 3-5 seconds. 5. Slowly lower your butt to the floor, and let your butt muscles relax. If this exercise is too easy, try doing it with your arms crossed over your chest. Belly Crunches Do these steps 5-10 times in a row: 1. Lie on your back on a firm bed or  the floor with your legs stretched out. 2. Bend your knees so they point up to the ceiling. Your feet should be flat on the floor. 3. Cross your arms over your chest. 4. Tip your chin a little bit toward your chest but do not bend your neck. 5. Tighten your belly muscles and slowly raise your chest just enough to lift your shoulder blades a tiny bit off of the floor. 6. Slowly lower your chest and your head to the floor. Back Lifts Do these steps 5-10 times in a row: 1. Lie on your belly (face-down) with your arms at your sides, and rest your forehead on the floor. 2. Tighten the muscles in your legs and your butt. 3. Slowly lift your chest off of the floor while you keep your hips on the floor. Keep the back of your head in line with the curve in your back. Look at the floor while you do this. 4. Stay in this position for 3-5 seconds. 5. Slowly lower your chest and your face to the floor. Contact a doctor if:  Your back pain gets a lot worse when you do an exercise.  Your back pain does not lessen 2 hours after you exercise. If you have any of these problems, stop doing the exercises. Do not do them again unless your doctor says it is okay. Get help right away if:  You have sudden, very bad back pain. If this happens, stop doing the exercises. Do not do them again unless your doctor says it is okay. This information is not intended to replace advice given to you by your health care provider. Make sure you discuss any questions you have with your health care provider. Document Released: 09/30/2010 Document Revised: 05/22/2018 Document Reviewed: 10/22/2014 Elsevier Interactive Patient Education  2019 [place]. Core Strength Exercises  Core exercises help to build strength in the muscles between your ribs and your hips (abdominal muscles). These muscles help to support your body and keep your spine stable. It is important to maintain strength  in your core to prevent injury and pain.  Some activities, such as yoga and Pilates, can help to strengthen core muscles. You can also strengthen core muscles with exercises at home. It is important to talk to your health care provider before you start a new exercise routine. What are the benefits of core strength exercises? Core strength exercises can:  Reduce back pain.  Help to rebuild strength after a back or spine injury.  Help to prevent injury during physical activity, especially injuries to the back and knees. How to do core strength exercises Repeat these exercises 10-15 times, or until you are tired. Do exercises exactly as told by your health care provider and adjust them as directed. It is normal to feel mild stretching, pulling, tightness, or discomfort as you do these exercises. If you feel any pain while doing these exercises, stop. If your pain continues or gets worse when doing core exercises, contact your health care provider. You may want to use a padded yoga or exercise mat for strength exercises that are done on the floor. Bridging  5. Lie on your back on a firm surface with your knees bent and your feet flat on the floor. 6. Raise your hips so that your knees, hips, and shoulders form a straight line together. Keep your abdominal muscles tight. 7. Hold this position for 3-5 seconds. 8. Slowly lower your hips to the starting position. 9. Let your muscles relax completely between repetitions. Single-leg bridge 6. Lie on your back on a firm surface with your knees bent and your feet flat on the floor. 7. Raise your hips so that your knees, hips, and shoulders form a straight line together. Keep your abdominal muscles tight. 8. Lift one foot off the floor, then completely straighten that leg. 9. Hold this position for 3-5 seconds. 10. Put the straight leg back down in the bent position. 11. Slowly lower your hips to the starting position. 12. Repeat these steps using your other leg. Side bridge 6. Lie on your  side with your knees bent. Prop yourself up on the elbow that is near the floor. 7. Using your abdominal muscles and your elbow that is on the floor, raise your body off the floor. Raise your hip so that your shoulder, hip, and foot form a straight line together. 8. Hold this position for 10 seconds. Keep your head and neck raised and away from your shoulder (in their normal, neutral position). Keep your abdominal muscles tight. 9. Slowly lower your hip to the starting position. 10. Repeat and try to hold this position longer, working your way up to 30 seconds. Abdominal crunch 6. Lie on your back on a firm surface. Bend your knees and keep your feet flat on the floor. 7. Cross your arms over your chest. 8. Without bending your neck, tip your chin slightly toward your chest. 9. Tighten your abdominal muscles as you lift your chest just high enough to lift your shoulder blades off of the floor. Do not hold your breath. You can do this with short lifts or long lifts. 10. Slowly return to the starting position. Bird dog 7. Get on your hands and knees, with your legs shoulder-width apart and your arms under your shoulders. Keep your back straight. 8. Tighten your abdominal muscles. 9. Raise one of your legs off the floor and straighten it. Try to keep it parallel to the floor. 10. Slowly lower your leg to the starting position. 11. Raise one of your arms  off the floor and straighten it. Try to keep it parallel to the floor. 12. Slowly lower your arm to the starting position. 13. Repeat with the other arm and leg. If possible, try raising a leg and arm at the same time, on opposite sides of the body. For example, raise your left hand and your right leg. Plank 6. Lie on your belly. 7. Prop up your body onto your forearms and your feet, keeping your legs straight. Your body should make a straight line between your shoulders and feet. 8. Hold this position for 10 seconds while keeping your abdominal  muscles tight. 9. Lower your body to the starting position. 10. Repeat and try to hold this position longer, working your way up to 30 seconds. Cross-core strengthening 1. Stand with your feet shoulder-width apart. 2. Hold a ball out in front of you. Keep your arms straight. 3. Tighten your abdominal muscles and slowly rotate at your waist from side to side. Keep your feet flat. 4. Once you are comfortable, try repeating this exercise with a heavier ball. Top core strengthening 1. Stand about 18 inches (46 cm) in front of a wall, with your back to the wall. 2. Keep your feet flat and shoulder-width apart. 3. Tighten your abdominal muscles. 4. Bend your hips and knees. 5. Slowly reach between your legs to touch the wall behind you. 6. Slowly stand back up. 7. Raise your arms over your head and reach behind you. 8. Return to the starting position. General tips  Do not do any exercises that cause pain. If you have pain while exercising, talk to your health care provider.  Always stretch before and after doing these exercises. This can help prevent injury.  Maintain a healthy weight. Ask your health care provider what weight is healthy for you. Contact a health care provider if:  You have back pain that gets worse or does not go away.  You feel pain while doing core strength exercises. Get help right away if:  You have severe pain that does not get better with medicine. Summary  Core exercises help to build strength in the muscles between your ribs and your waist.  Core muscles help to support your body and keep your spine stable.  Some activities, such as yoga and Pilates, can help to strengthen core muscles.  Core strength exercises can help back pain and can prevent injury.  If you feel any pain while doing core strength exercises, stop. This information is not intended to replace advice given to you by your health care provider. Make sure you discuss any questions you have  with your health care provider. Document Released: 01/17/2017 Document Revised: 01/17/2017 Document Reviewed: 01/17/2017 Elsevier Interactive Patient Education  2019 [employer].

## 2019-02-06 NOTE — Telephone Encounter (Signed)
Opened in error

## 2019-02-14 ENCOUNTER — Other Ambulatory Visit: Payer: Self-pay | Admitting: Rheumatology

## 2019-02-14 DIAGNOSIS — M797 Fibromyalgia: Secondary | ICD-10-CM

## 2019-02-24 LAB — LIPID PANEL
Cholesterol: 270 — AB (ref 0–200)
HDL: 44 (ref 35–70)
LDL Cholesterol: 187
Triglycerides: 194 — AB (ref 40–160)

## 2019-02-25 DIAGNOSIS — S6980XA Other specified injuries of unspecified wrist, hand and finger(s), initial encounter: Secondary | ICD-10-CM | POA: Insufficient documentation

## 2019-02-25 DIAGNOSIS — S6982XA Other specified injuries of left wrist, hand and finger(s), initial encounter: Secondary | ICD-10-CM

## 2019-02-25 DIAGNOSIS — S5002XA Contusion of left elbow, initial encounter: Secondary | ICD-10-CM | POA: Insufficient documentation

## 2019-02-25 HISTORY — DX: Other specified injuries of unspecified wrist, hand and finger(s), initial encounter: S69.80XA

## 2019-02-25 HISTORY — DX: Other specified injuries of left wrist, hand and finger(s), initial encounter: S69.82XA

## 2019-02-28 ENCOUNTER — Other Ambulatory Visit: Payer: Self-pay | Admitting: Family Medicine

## 2019-03-10 ENCOUNTER — Encounter: Payer: Self-pay | Admitting: *Deleted

## 2019-03-10 ENCOUNTER — Telehealth: Payer: Self-pay | Admitting: *Deleted

## 2019-03-10 NOTE — Telephone Encounter (Signed)
Spoke with pt, confirmed name & DOB. She provided updates to chart including history and meds. She understands how to check-in to [facility] visit tomorrow morning. She verbalized appreciation for the call.

## 2019-03-11 ENCOUNTER — Other Ambulatory Visit: Payer: Self-pay

## 2019-03-11 ENCOUNTER — Encounter: Payer: Self-pay | Admitting: Family Medicine

## 2019-03-11 ENCOUNTER — Telehealth: Payer: BLUE CROSS/BLUE SHIELD | Admitting: Neurology

## 2019-03-11 ENCOUNTER — Telehealth (INDEPENDENT_AMBULATORY_CARE_PROVIDER_SITE_OTHER): Payer: BC Managed Care – PPO | Admitting: Family Medicine

## 2019-03-11 DIAGNOSIS — M542 Cervicalgia: Secondary | ICD-10-CM | POA: Diagnosis not present

## 2019-03-11 DIAGNOSIS — Z8669 Personal history of other diseases of the nervous system and sense organs: Secondary | ICD-10-CM

## 2019-03-11 MED ORDER — EMGALITY 120 MG/ML ~~LOC~~ SOAJ: 120.0000 mg | SUBCUTANEOUS | 3 refills | Status: DC

## 2019-03-11 MED ORDER — UBRELVY 50 MG PO TABS: 50.0000 mg | ORAL_TABLET | Freq: Every day | ORAL | 11 refills | Status: DC | PRN

## 2019-03-11 NOTE — Progress Notes (Signed)
Made any corrections needed, and agree with history, physical, neuro exam,assessment and plan as stated.     Sherrina Zaugg, MD [name]  
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
## 2019-03-18 ENCOUNTER — Ambulatory Visit: Payer: BC Managed Care – PPO | Admitting: Physical Therapy

## 2019-03-18 ENCOUNTER — Encounter: Payer: Self-pay | Admitting: Family Medicine

## 2019-03-20 ENCOUNTER — Telehealth: Payer: Self-pay | Admitting: *Deleted

## 2019-03-20 ENCOUNTER — Other Ambulatory Visit: Payer: Self-pay

## 2019-03-20 ENCOUNTER — Telehealth (INDEPENDENT_AMBULATORY_CARE_PROVIDER_SITE_OTHER): Payer: BC Managed Care – PPO | Admitting: Family Medicine

## 2019-03-20 DIAGNOSIS — R05 Cough: Secondary | ICD-10-CM | POA: Diagnosis not present

## 2019-03-20 DIAGNOSIS — Z20822 Contact with and (suspected) exposure to covid-19: Secondary | ICD-10-CM

## 2019-03-20 DIAGNOSIS — R059 Cough, unspecified: Secondary | ICD-10-CM

## 2019-03-20 NOTE — Assessment & Plan Note (Signed)
Symptoms may be secondary to known allergies vs early onset viral URI, however per patient request COVID test referral sent. No real concern for PNA at this time. Advised of safety precautions until test result including isolate from other family, frequent hand washing, disinfecting high-touch areas of the home, and wear a mask when in public or around others. Return precautions discussed at length including development of any SOB or difficulty breathing. Patient understood and agreed to plan.

## 2019-03-20 NOTE — Telephone Encounter (Addendum)
Contacted pt to schedule testing; pt accepted appointment at [facility] site 03/21/2019 at 1015; pt given address, location, and instructions that she and all occupants of her vehicle should wear masks; pt also notified that results are typically available in 5-7 business days, and are available for review in [place]; she verbalizes understanding, and states that she is already signed up; orders placed per protocol. ----- Message from Ozella Almond, [place] sent at 03/20/2019 10:59 AM EDT ----- Good morning,  Could you please schedule this patient for Covid-19 testing.  COVID Drive-Up Test Referral Criteria   Patient age: 63 y.o.   Symptoms:  Cough and Sore throat   Underlying Conditions:  HTN   Is the patient a first responder?  No   Does the patient live or work in a high risk or high density environment:  No   Is the patient a COVID convalescent patient who is 14-28 days symptom-free and interested in donating plasma for use as a therapeutic product? No   Thanks,  .Ozella Almond, CMA

## 2019-03-20 NOTE — Progress Notes (Signed)
[facility] Tele[facility]icine Visit  Patient consented to have virtual visit. Method of visit: Video  Encounter participants: Patient: Molly Giles - located at home Provider: Danna Hefty - located at home Others (if applicable): None  Chief Complaint: COVID symptoms  HPI: Patient notes new dry cough x 1 week ago and sore throat x 3 days that improved yesterday. Denies any fevers but does have some occasional sweats. She notes she has been taking Zyrtec because she thought this may be from allergies and with this the sore throat improved. She notes she had a potential exposure to COVID ~3 weeks ago from her chiropractor. She has been out with others/lunch with friend but no sick contacts. No recent travels. Denies any SOB/trouble breathing, nausea, vomiting. Some nasal congestion. Endorses more frequent migraines in which she has had to take her triptans with resolution. Husband is getting a procedure on Monday so he is getting a COVID test completed today.   ROS: per HPI  Pertinent PMHx: HTN, migraines, allergic rhinitis  Exam:  GEN: pleasant older lady, NAD, sitting up comfortably at table Respiratory: speaking in full sentences, breathing comfortably on room air, no cough appreciated during exam  Assessment/Plan:  Cough in adult Symptoms may be secondary to known allergies vs early onset viral URI, however per patient request COVID test referral sent. No real concern for PNA at this time. Advised of safety precautions until test result including isolate from other family, frequent hand washing, disinfecting high-touch areas of the home, and wear a mask when in public or around others. Return precautions discussed at length including development of any SOB or difficulty breathing. Patient understood and agreed to plan.     Time spent during visit with patient: 15 minutes  Mina Marble, [place], PGY2

## 2019-03-21 ENCOUNTER — Telehealth: Payer: Self-pay | Admitting: Rheumatology

## 2019-03-21 ENCOUNTER — Other Ambulatory Visit: Payer: Self-pay | Admitting: Rheumatology

## 2019-03-21 ENCOUNTER — Other Ambulatory Visit: Payer: BC Managed Care – PPO

## 2019-03-21 DIAGNOSIS — Z20822 Contact with and (suspected) exposure to covid-19: Secondary | ICD-10-CM

## 2019-03-21 DIAGNOSIS — F3289 Other specified depressive episodes: Secondary | ICD-10-CM

## 2019-03-21 DIAGNOSIS — M797 Fibromyalgia: Secondary | ICD-10-CM

## 2019-03-21 NOTE — Telephone Encounter (Signed)
Returned call to patient. Patient asking about her Ambien prescription. Patient advised prescription being faxed now as we were waiting on a signature from the doctor.

## 2019-03-21 NOTE — Telephone Encounter (Signed)
ok 

## 2019-03-21 NOTE — Telephone Encounter (Signed)
Last Visit: 02/04/19 Next Visit: 08/04/19  Okay to refill Ambien?

## 2019-03-21 NOTE — Telephone Encounter (Signed)
Last Visit: 02/04/19 Next Visit: 08/04/19  Okay to refill per Dr. Estanislado Pandy

## 2019-03-21 NOTE — Telephone Encounter (Signed)
Patient left a voicemail stating she was returning a call to the office.   °

## 2019-03-24 ENCOUNTER — Encounter: Payer: Self-pay | Admitting: Family Medicine

## 2019-03-26 ENCOUNTER — Ambulatory Visit: Payer: BC Managed Care – PPO | Admitting: Physical Therapy

## 2019-03-27 LAB — NOVEL CORONAVIRUS, NAA: SARS-CoV-2, NAA: NOT DETECTED

## 2019-04-01 ENCOUNTER — Encounter: Payer: Self-pay | Admitting: Physical Therapy

## 2019-04-01 ENCOUNTER — Ambulatory Visit: Payer: BC Managed Care – PPO | Attending: Family Medicine | Admitting: Physical Therapy

## 2019-04-01 ENCOUNTER — Other Ambulatory Visit: Payer: Self-pay

## 2019-04-01 DIAGNOSIS — G4489 Other headache syndrome: Secondary | ICD-10-CM | POA: Insufficient documentation

## 2019-04-01 DIAGNOSIS — M542 Cervicalgia: Secondary | ICD-10-CM | POA: Insufficient documentation

## 2019-04-01 NOTE — Therapy (Signed)
[place], [place], [address] Phone: [phone]   Fax:  [phone]  Physical Therapy Evaluation  Patient Details  Name: Molly Giles MRN: [id number] Date of Birth: 02/11/1956 Referring Provider (PT): Debbora Presto, NP   Encounter Date: 04/01/2019  PT End of Session - 04/01/19 1324    Visit Number  1    Number of Visits  17    Date for PT Re-Evaluation  05/30/19    Authorization Type  [employer]    PT Start Time  1304    PT Stop Time  1348    PT Time Calculation (min)  44 min    Activity Tolerance  Patient tolerated treatment well    Behavior During Therapy  [facility] for tasks assessed/performed       Past Medical History:  Diagnosis Date  . Allergic rhinoconjunctivitis 07/02/2015  . Arthritis   . Benign positional vertigo 04/2015   Responded well to [facility]  . Bruxism (teeth grinding)   . Chronic migraine 02/25/2016   Per Dr Jaynee Eagles review in notes from [facility] from September 2015. Showed total headache days last month 18. Severe headache days 7 days. Moderate headache days 5 days. Mild headache days last month sick days. Days without headache last month 10 days. Symptoms associated with photophobia, phonophobia, osmophobia, neck pain, dizziness, jaw pain, nasal congestion, vision disturbances, tingling and numbness, weakness and worsening with activity. Each headache attack last 3 hours depending on treatment in severity. Left side, the right side, easier side, the frontal area in the back of the head. Characterized as throbbing, pressure, tightness, squeezing, stabbing and burning   . Chronic migraine w/o aura w/o status migrainosus, not intractable 07/14/2015  . DDD (degenerative disc disease), cervical 11/18/2018  . DDD (degenerative disc disease), lumbar   . Degenerative cervical disc   . Depression   . Disc displacement, lumbar   . Dysrhythmia    seen by dr Wynonia Lawman- not a problem  since she has been on Bystolic  . Essential hypertension 05/15/2013  . Family history of adverse reaction to anesthesia    Brother- N/V  . Family history of premature CAD 05/15/2013  . Fibromyalgia   . GERD (gastroesophageal reflux disease) 12/16/2014  . H/O seasonal allergies   . Hammer toe    Left great toe  . Hearing loss of both ears 07/27/2015   mild to borderline moderate low frequency hearing loss improving to within normal limits bilaterally on audiology testing at [facility] in November 2016.    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 History of colonic polyps   . History of irritable bowel syndrome 02/15/2016  . History of migraine headaches 05/15/2013   Per Dr Jaynee Eagles review in notes from [facility] from September 2015. Showed total headache days last month 18. Severe headache days 7 days. Moderate headache days 5 days. Mild headache days last month sick days. Days without headache last month 10 days. Symptoms associated with photophobia, phonophobia, osmophobia, neck pain, dizziness, jaw pain, nasal congestion, vision disturbances, tingling and numbness, weakness and worsening with activity. Each headache attack last 3 hours depending on treatment in severity. Left side, the right side, easier side, the frontal area in the back of the head. Characterized as throbbing, pressure, tightness, squeezing, stabbing and burning    . Hx of bad fall 02/2015   Severe Facial/head trauma without fracture  . Hyperlipidemia  1998  . Hypertension 2004  . Hypothyroidism   . Impairment of balance 02/2015   Consequent of postconcussive syndrome  . Injury of triangular fibrocartilage complex of left wrist 02/25/2019   Dx 02/25/19 Iran Planas IV MD [facility])  . Interstitial cystitis   . 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[place]) 03/10/2018   [facility]  . Lumbar facet joint pain   . 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  Migraine 1962   Chronic Migraines  . Mood disorder ([place])   . Musculoskeletal neck pain 07/14/2015  . Normal coronary arteries 05/14/2014  . Osteoarthritis of left hip 11/01/2015   MRI order by Dr Alvan Dame (ortho) 10/2015 showed significant arthritis of left hip joint with cystic changes in femoral head c/w osteoarthritis  . 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 Overweight   . Pain in joint of left shoulder 11/09/2016  . Palpitations 05/15/2013   [facility] manages  . Periodic limb movement sleep disorder 03/28/2017  . Perirectal cyst 05/07/2016  . PONV (postoperative nausea and vomiting)   . Post concussion syndrome 06/06/2015  . Post concussive syndrome 07/14/2015   Ms Riffe's post-concussive syndrome manifesting in vertigo and headache, mood changes, poor balance, dizziness, and decreased concentration per Dr Jaynee Eagles at [facility].   Marland Kitchen Posterior vitreous detachment of right eye 2014  . Shingles   . Shortness of breath dyspnea    with exertion  . Thyroid nodule 08/11/2009   Findings: The thyroid gland is within normal limits in size.  The gland is diffusely inhomogeneous. A small solid nodule is noted in the lower pole  medially on the right of 7 x 6 x 8 mm. A small solid nodule is noted inferiorly on the left of 3 x 3 x 4 mm.  IMPRESSION:  The thyroid gland is within normal limits in size with only small solid nodules present, the largest of only 8 mm in diameter on the right.    . 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   . Vasomotor symptoms due to menopause 04/19/2017    Past Surgical History:  Procedure Laterality Date  . Bladder dilitation     x 3  . BREAST BIOPSY Right 2011   Benign histology  . CARDIOVASCULAR STRESS TEST  2000   Unremarkable per pt report  . CARPOMETACARPAL JOINT ARTHROTOMY Right 2011  . COLONOSCOPY    . COLONOSCOPY WITH PROPOFOL N/A 04/21/2015    Procedure: COLONOSCOPY WITH PROPOFOL;  Surgeon: Carol Ada, MD;  Location: [facility];  Service: Endoscopy;  Laterality: N/A;  . EPIDURAL BLOCK INJECTION Left 04/12/2016   Left Medial Nerve Block and Left L5 ramus block, Dr Suella Broad   . EPIDURAL BLOCK INJECTION  03/21/2016   Left L3-4 medial branch block and Left L5 & dorsal ramus block   . EPIDURAL BLOCK INJECTION N/A 10/25/2016   Suella Broad, MD. Lumbar medial branch block  . EPIDURAL BLOCK INJECTION N/A 02/09/2017   Suella Broad, MD.  Bilateral L3/4 medial branch block, bilateral L5 dorsal ramus block  . EPIDURAL BLOCK INJECTION N/A 07/04/2017   Suella Broad, MD  . FECAL TRANSPLANT  04/21/2015   Procedure: FECAL TRANSPLANT;  Surgeon: Carol Ada, MD;  Location: [place];  Service: Endoscopy;;  . HIP ARTHROPLASTY Left   . HIP ARTHROSCOPY Left 03/06/2018   Left hip arthroplasty, redo for loose hip arthroplasty. Procedure at [facility] hospital  . INJECTION HIP  INTRA ARTICULAR Left 11/2015   for OA by Dr Suella Broad  . OTHER SURGICAL HISTORY Left 2016   Left L3/L4 medial nerve block and Left L5 Dorsal Ramus block Dr Mickel Duhamel  . TOTAL HIP ARTHROPLASTY Left 07/25/2016   Procedure: LEFT TOTAL HIP ARTHROPLASTY ANTERIOR APPROACH;  Surgeon: Paralee Cancel, MD;  Location: [facility];  Service: Orthopedics;  Laterality: Left;    There were no vitals filed for this visit.   Subjective Assessment - 04/01/19 1312    Subjective  I have had migraines since I was 49-10 yo, they have gotten worse since I have gotten older. I grind my teeth at night. Suboccipital and upper trap tightness bilaterally. When I have a migraine, I hurt into my head. Increase in tension with migraines, denies N/T. Began waking iwth migraines in last 2 months.    How long can you walk comfortably?  .5 mile    Patient Stated Goals  decrease pain, lift grandson, more activities with kids, be more active, kayak    Currently in Pain?  Yes    Pain Score  5     Pain  Location  Neck    Pain Orientation  Right;Left    Pain Descriptors / Indicators  Tightness;Aching    Aggravating Factors   sitting too long, migraine triggers: alcohol, odors/fragrances, barometric pressure drop    Pain Relieving Factors  hot showers         [facility] PT Assessment - 04/01/19 0001      Assessment   Medical Diagnosis  migraines, cervicalgia    Referring Provider (PT)  Lomax, Amy, NP    Onset Date/Surgical Date  --   acute on chronic   Hand Dominance  Right      Precautions   Precautions  None      Restrictions   Weight Bearing Restrictions  No      Balance Screen   Has the patient fallen in the past 6 months  No    Has the patient had a decrease in activity level because of a fear of falling?   No    Is the patient reluctant to leave their home because of a fear of falling?   No      Home Film/video editor residence    Living Arrangements  Spouse/significant other      Prior Function   Level of [place]  Retired      Associate Professor   Overall Cognitive Status  Within Functional Limits for tasks assessed      Observation/Other Assessments   Focus on Therapeutic Outcomes (FOTO)   46% limitation      Sensation   Additional Comments  WFL      Posture/Postural Control   Posture Comments  Lt hip elevation ( LLD)  functional scoliosis up chain, flattened thoracic kyphosis, rounded shoulders, fwd head,       ROM / Strength   AROM / PROM / Strength  AROM;Strength      AROM   AROM Assessment Site  Cervical    Cervical Flexion  30    Cervical Extension  38    Cervical - Right Side Bend  34    Cervical - Left Side Bend  28    Cervical - Right Rotation  48    Cervical - Left Rotation  60      Strength   Overall Strength Comments  gross 4/5 with notable  shoulder elevation      Palpation   Palpation comment  gross tenderness and tighness in GHJ, cervica. & TMJ bilaterally                Objective  measurements completed on examination: See above findings.              PT Education - 04/01/19 1707    Education Details  anatomy of condition, POC, HEP, exercise form/rationale    Person(s) Educated  Patient    Methods  Explanation;Demonstration;Tactile cues;Verbal cues    Comprehension  Verbalized understanding;Returned demonstration;Verbal cues required;Tactile cues required;Need further instruction       PT Short Term Goals - 04/01/19 1456      PT SHORT TERM GOAL #1   Title  cervical rotation within 5 deg Rt to Lt    Baseline  see flowsheet    Time  4    Period  Weeks    Status  New    Target Date  05/02/19      PT SHORT TERM GOAL #2   Title  pt will be able to present a proper resting posture without cues    Baseline  tactile and verbal cues required.    Time  4    Period  Weeks    Status  New    Target Date  05/02/19        PT Long Term Goals - 04/01/19 1457      PT LONG TERM GOAL #1   Title  Pt will be able to play with grand children without increase in pain the next day    Baseline  reports a few days of severe pain following    Time  8    Period  Weeks    Status  New    Target Date  05/30/19      PT LONG TERM GOAL #2   Title  Pt will be able to walk at least 1 mile without increase in pain    Baseline  pain at .5 miles at eval and would like to return to walking for exercise    Time  8    Period  Weeks    Status  New    Target Date  05/30/19      PT LONG TERM GOAL #3   Title  pt will be able to participate in outdoor activities    Baseline  avoids at this time due to pain and tightness that she feels after    Time  8    Period  Weeks    Status  New    Target Date  05/30/19      PT LONG TERM GOAL #4   Title  FOTO to 40% limited    Baseline  46% limited at eval    Time  8    Period  Weeks    Status  New    Target Date  05/30/19      PT LONG TERM GOAL #5   Title  .             Plan - 04/01/19 1350    Clinical Impression  Statement  Pt presents to PT with complaints of chronic neck, head and TMJ pain with migraines. Postural deviations along biomechanical chain as compensation for LLD s/p Lt THA. Pt was having a 'hot flash" upon arrival to eval today and was provided with ice pack to drape around her neck. Overall she presents with  good UE strength in straight plane MMT but does present with bil shoulder elevation with each test. Pt will benefit from skilled PT in order to decrease muscular tightness that contributes to HA and improve postural alignment and biomechanical chain activation.    Personal Factors and Comorbidities  Time since onset of injury/illness/exacerbation;Comorbidity 1    Comorbidities  see PMH    Examination-Activity Limitations  Locomotion Level;Lift;Dressing;Stand;Squat;Carry;Sleep;Bend;Reach Overhead;Bed Mobility    Examination-Participation Restrictions  Meal Prep;Community Activity;Driving;Laundry    Stability/Clinical Decision Making  Evolving/Moderate complexity    Clinical Decision Making  Moderate    Rehab Potential  Good    PT Frequency  2x / week    PT Duration  8 weeks    PT Treatment/Interventions  ADLs/Self Care Home Management;Cryotherapy;Electrical Stimulation;Ultrasound;Moist Heat;Traction;Iontophoresis 4mg /ml Dexamethasone;Functional mobility training;Therapeutic activities;Therapeutic exercise;Neuromuscular re-education;Manual techniques;Patient/family education;Passive range of motion;Dry needling;Taping    PT Next Visit Plan  dry needling    PT Home Exercise Plan  scapular retraction-resting posture, avoid crossing legs    Consulted and Agree with Plan of Care  Patient       Patient will benefit from skilled therapeutic intervention in order to improve the following deficits and impairments:  Decreased range of motion, Difficulty walking, Impaired UE functional use, Increased muscle spasms, Decreased activity tolerance, Pain, Improper body mechanics, Impaired flexibility,  Decreased strength, Postural dysfunction  Visit Diagnosis: 1. Cervicalgia   2. Other headache syndrome        Problem List Patient Active Problem List   Diagnosis Date Noted  . Cough in adult 03/20/2019  . 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 Pain in joint, multiple sites 11/18/2018  . DDD (degenerative disc disease), cervical 11/18/2018  . DDD (degenerative disc disease), lumbar 11/18/2018  . Positive ANA (antinuclear antibody) 11/18/2018  . Sicca syndrome ([place]) 11/18/2018  . History of colonic polyps 09/30/2018  . Chronic contact dermatitis 08/06/2018  . Chronic migraine without aura, with intractable migraine, so stated, with status migrainosus 11/28/2017  . URI (upper respiratory infection) 08/14/2017  . Fatigue 07/05/2017  . Insulin resistance 07/04/2017  . Vitamin D deficiency 05/08/2017  . Class 1 obesity without serious comorbidity with body mass index (BMI) of 30.0 to 30.9 in adult 04/24/2017  . Vasomotor symptoms due to menopause 04/19/2017  . Periodic limb movement sleep disorder 03/28/2017  . Nocturnal hypoxemia 03/06/2017  . Morbid obesity ([place]) 03/06/2017  . Snoring 03/06/2017  . Gasping for breath 03/06/2017  . Sleep walking and eating 03/06/2017  . Episodic cluster headache, not intractable 03/06/2017  . Obesity (BMI 30.0-34.9) 01/29/2017  . Other insomnia 11/09/2016  . S/P left THA, AA 07/25/2016  . Irritable bowel syndrome with diarrhea 04/03/2016  . Chronic migraine 02/25/2016  . Left ventricular hypertrophy, mild 02/25/2016  . History of irritable bowel syndrome 02/15/2016  . 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 Osteoarthritis of left hip 11/01/2015  . Hearing loss of both ears 07/27/2015  . Post concussive syndrome 07/14/2015  . Depression (NOS) 07/02/2015  . Allergic rhinoconjunctivitis 07/02/2015  . Fibromyalgia syndrome 07/01/2015  . History of Clostridium difficile colitis, persistent  07/01/2015  . Impairment of balance 02/10/2015  . Essential hypertension 05/15/2013  . Hypothyroidism 05/15/2013  . Hyperlipidemia 05/15/2013  . Interstitial cystitis 05/15/2013  . Chronic migraine without aura 05/15/2013  . Spondylosis of lumbar region without myelopathy or radiculopathy 10/30/2011   Teeghan Hammer C. Nareh Matzke PT, DPT 04/01/19 [facility] [phone]  [place], [place], [address] Phone: [phone]   Fax:  [phone]  Name: Molly Giles MRN: [id number] Date of Birth: 30-May-1956

## 2019-04-07 DIAGNOSIS — M79641 Pain in right hand: Secondary | ICD-10-CM | POA: Insufficient documentation

## 2019-04-08 ENCOUNTER — Ambulatory Visit: Payer: BC Managed Care – PPO | Admitting: Physical Therapy

## 2019-04-08 ENCOUNTER — Encounter: Payer: Self-pay | Admitting: Physical Therapy

## 2019-04-08 ENCOUNTER — Other Ambulatory Visit: Payer: Self-pay

## 2019-04-08 DIAGNOSIS — G4489 Other headache syndrome: Secondary | ICD-10-CM

## 2019-04-08 DIAGNOSIS — M542 Cervicalgia: Secondary | ICD-10-CM | POA: Diagnosis not present

## 2019-04-08 NOTE — Therapy (Signed)
[place], [place], [address] Phone: [phone]   Fax:  [phone]  Physical Therapy Treatment  Patient Details  Name: Molly Giles MRN: [id number] Date of Birth: 10-08-1955 Referring Provider (PT): Debbora Presto, NP   Encounter Date: 04/08/2019  PT End of Session - 04/08/19 1404    Visit Number  2    Number of Visits  17    Date for PT Re-Evaluation  05/30/19    Authorization Type  [employer]    PT Start Time  1400    PT Stop Time  1443    PT Time Calculation (min)  43 min    Activity Tolerance  Patient tolerated treatment well    Behavior During Therapy  [facility] for tasks assessed/performed       Past Medical History:  Diagnosis Date  . Allergic rhinoconjunctivitis 07/02/2015  . Arthritis   . Benign positional vertigo 04/2015   Responded well to [facility]  . Bruxism (teeth grinding)   . Chronic migraine 02/25/2016   Per Dr Jaynee Eagles review in notes from [facility] from September 2015. Showed total headache days last month 18. Severe headache days 7 days. Moderate headache days 5 days. Mild headache days last month sick days. Days without headache last month 10 days. Symptoms associated with photophobia, phonophobia, osmophobia, neck pain, dizziness, jaw pain, nasal congestion, vision disturbances, tingling and numbness, weakness and worsening with activity. Each headache attack last 3 hours depending on treatment in severity. Left side, the right side, easier side, the frontal area in the back of the head. Characterized as throbbing, pressure, tightness, squeezing, stabbing and burning   . Chronic migraine w/o aura w/o status migrainosus, not intractable 07/14/2015  . DDD (degenerative disc disease), cervical 11/18/2018  . DDD (degenerative disc disease), lumbar   . Degenerative cervical disc   . Depression   . Disc displacement, lumbar   . Dysrhythmia    seen by dr Wynonia Lawman- not a problem  since she has been on Bystolic  . Essential hypertension 05/15/2013  . Family history of adverse reaction to anesthesia    Brother- N/V  . Family history of premature CAD 05/15/2013  . Fibromyalgia   . GERD (gastroesophageal reflux disease) 12/16/2014  . H/O seasonal allergies   . Hammer toe    Left great toe  . Hearing loss of both ears 07/27/2015   mild to borderline moderate low frequency hearing loss improving to within normal limits bilaterally on audiology testing at [facility] in November 2016.    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 History of colonic polyps   . History of irritable bowel syndrome 02/15/2016  . History of migraine headaches 05/15/2013   Per Dr Jaynee Eagles review in notes from [facility] from September 2015. Showed total headache days last month 18. Severe headache days 7 days. Moderate headache days 5 days. Mild headache days last month sick days. Days without headache last month 10 days. Symptoms associated with photophobia, phonophobia, osmophobia, neck pain, dizziness, jaw pain, nasal congestion, vision disturbances, tingling and numbness, weakness and worsening with activity. Each headache attack last 3 hours depending on treatment in severity. Left side, the right side, easier side, the frontal area in the back of the head. Characterized as throbbing, pressure, tightness, squeezing, stabbing and burning    . Hx of bad fall 02/2015   Severe Facial/head trauma without fracture  . Hyperlipidemia  1998  . Hypertension 2004  . Hypothyroidism   . Impairment of balance 02/2015   Consequent of postconcussive syndrome  . Injury of triangular fibrocartilage complex of left wrist 02/25/2019   Dx 02/25/19 Iran Planas IV MD [facility])  . Interstitial cystitis   . 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[place]) 03/10/2018   [facility]  . Lumbar facet joint pain   . 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  Migraine 1962   Chronic Migraines  . Mood disorder ([place])   . Musculoskeletal neck pain 07/14/2015  . Normal coronary arteries 05/14/2014  . Osteoarthritis of left hip 11/01/2015   MRI order by Dr Alvan Dame (ortho) 10/2015 showed significant arthritis of left hip joint with cystic changes in femoral head c/w osteoarthritis  . 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 Overweight   . Pain in joint of left shoulder 11/09/2016  . Palpitations 05/15/2013   [facility] manages  . Periodic limb movement sleep disorder 03/28/2017  . Perirectal cyst 05/07/2016  . PONV (postoperative nausea and vomiting)   . Post concussion syndrome 06/06/2015  . Post concussive syndrome 07/14/2015   Ms Paull's post-concussive syndrome manifesting in vertigo and headache, mood changes, poor balance, dizziness, and decreased concentration per Dr Jaynee Eagles at [facility].   Marland Kitchen Posterior vitreous detachment of right eye 2014  . Shingles   . Shortness of breath dyspnea    with exertion  . Thyroid nodule 08/11/2009   Findings: The thyroid gland is within normal limits in size.  The gland is diffusely inhomogeneous. A small solid nodule is noted in the lower pole  medially on the right of 7 x 6 x 8 mm. A small solid nodule is noted inferiorly on the left of 3 x 3 x 4 mm.  IMPRESSION:  The thyroid gland is within normal limits in size with only small solid nodules present, the largest of only 8 mm in diameter on the right.    . 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   . Vasomotor symptoms due to menopause 04/19/2017    Past Surgical History:  Procedure Laterality Date  . Bladder dilitation     x 3  . BREAST BIOPSY Right 2011   Benign histology  . CARDIOVASCULAR STRESS TEST  2000   Unremarkable per pt report  . CARPOMETACARPAL JOINT ARTHROTOMY Right 2011  . COLONOSCOPY    . COLONOSCOPY WITH PROPOFOL N/A 04/21/2015    Procedure: COLONOSCOPY WITH PROPOFOL;  Surgeon: Carol Ada, MD;  Location: [place];  Service: Endoscopy;  Laterality: N/A;  . EPIDURAL BLOCK INJECTION Left 04/12/2016   Left Medial Nerve Block and Left L5 ramus block, Dr Suella Broad   . EPIDURAL BLOCK INJECTION  03/21/2016   Left L3-4 medial branch block and Left L5 & dorsal ramus block   . EPIDURAL BLOCK INJECTION N/A 10/25/2016   Suella Broad, MD. Lumbar medial branch block  . EPIDURAL BLOCK INJECTION N/A 02/09/2017   Suella Broad, MD.  Bilateral L3/4 medial branch block, bilateral L5 dorsal ramus block  . EPIDURAL BLOCK INJECTION N/A 07/04/2017   Suella Broad, MD  . FECAL TRANSPLANT  04/21/2015   Procedure: FECAL TRANSPLANT;  Surgeon: Carol Ada, MD;  Location: [name];  Service: Endoscopy;;  . HIP ARTHROPLASTY Left   . HIP ARTHROSCOPY Left 03/06/2018   Left hip arthroplasty, redo for loose hip arthroplasty. Procedure at [facility] hospital  . INJECTION HIP  INTRA ARTICULAR Left 11/2015   for OA by Dr Suella Broad  . OTHER SURGICAL HISTORY Left 2016   Left L3/L4 medial nerve block and Left L5 Dorsal Ramus block Dr Mickel Duhamel  . TOTAL HIP ARTHROPLASTY Left 07/25/2016   Procedure: LEFT TOTAL HIP ARTHROPLASTY ANTERIOR APPROACH;  Surgeon: Paralee Cancel, MD;  Location: [facility];  Service: Orthopedics;  Laterality: Left;    There were no vitals filed for this visit.  Subjective Assessment - 04/08/19 1404    Subjective  When I wear my mouth, guard, I don't have a HA but if I take it out, I have a HA. today tight along Lt side of neck.    Patient Stated Goals  decrease pain, lift grandson, more activities with kids, be more active, kayak    Currently in Pain?  Yes    Pain Score  5     Pain Location  Neck    Pain Orientation  Left    Pain Descriptors / Indicators  Tightness                       [facility] Adult PT Treatment/Exercise - 04/08/19 0001      Exercises   Exercises  Neck      Neck Exercises:  Standing   Other Standing Exercises  bent over scapular retraction 2lb    Other Standing Exercises  triceps extension      Neck Exercises: Supine   Shoulder Flexion  Both;10 reps    Shoulder Flexion Limitations  tension on red tband    Upper Extremity D2  Flexion;15 reps;Theraband    Theraband Level (UE D2)  Level 2 (Red)    Other Supine Exercise  supine protraction 2lb      Manual Therapy   Manual Therapy  Soft tissue mobilization;Joint mobilization    Manual therapy comments  skilled palpation and monitoring during TPDN    Joint Mobilization  prone rib mobs & upper thoracic PA    Soft tissue mobilization  bil upper traps, suboccipitals      Neck Exercises: Stretches   Upper Trapezius Stretch  Right;Left;3 reps    Levator Stretch  Right;Left;2 reps    Chest Stretch Limitations  supine T    Other Neck Stretches  chin tuck       Trigger Point Dry Needling - 04/08/19 0001    Consent Given?  Yes    Education Handout Provided  Previously provided    Muscles Treated Head and Neck  Upper trapezius;Suboccipitals    Dry Needling Comments  suboccipitals Lt    Upper Trapezius Response  Twitch reponse elicited;Palpable increased muscle length   Left            PT Short Term Goals - 04/01/19 1456      PT SHORT TERM GOAL #1   Title  cervical rotation within 5 deg Rt to Lt    Baseline  see flowsheet    Time  4    Period  Weeks    Status  New    Target Date  05/02/19      PT SHORT TERM GOAL #2   Title  pt will be able to present a proper resting posture without cues    Baseline  tactile and verbal cues required.    Time  4    Period  Weeks    Status  New    Target Date  05/02/19        PT  Long Term Goals - 04/01/19 1457      PT LONG TERM GOAL #1   Title  Pt will be able to play with grand children without increase in pain the next day    Baseline  reports a few days of severe pain following    Time  8    Period  Weeks    Status  New    Target Date  05/30/19       PT LONG TERM GOAL #2   Title  Pt will be able to walk at least 1 mile without increase in pain    Baseline  pain at .5 miles at eval and would like to return to walking for exercise    Time  8    Period  Weeks    Status  New    Target Date  05/30/19      PT LONG TERM GOAL #3   Title  pt will be able to participate in outdoor activities    Baseline  avoids at this time due to pain and tightness that she feels after    Time  8    Period  Weeks    Status  New    Target Date  05/30/19      PT LONG TERM GOAL #4   Title  FOTO to 40% limited    Baseline  46% limited at eval    Time  8    Period  Weeks    Status  New    Target Date  05/30/19      PT LONG TERM GOAL #5   Title  .            Plan - 04/08/19 1943    Clinical Impression Statement  Decrease in pain following TPDN today with exercises to focus on extensors. She was over-contracting when performing scapular retraction at home and was corrected today.    PT Treatment/Interventions  ADLs/Self Care Home Management;Cryotherapy;Electrical Stimulation;Ultrasound;Moist Heat;Traction;Iontophoresis 4mg /ml Dexamethasone;Functional mobility training;Therapeutic activities;Therapeutic exercise;Neuromuscular re-education;Manual techniques;Patient/family education;Passive range of motion;Dry needling;Taping    PT Home Exercise Plan  scapular retraction-resting posture, avoid crossing legs, triceps extension, D2 flx band    Consulted and Agree with Plan of Care  Patient       Patient will benefit from skilled therapeutic intervention in order to improve the following deficits and impairments:  Decreased range of motion, Difficulty walking, Impaired UE functional use, Increased muscle spasms, Decreased activity tolerance, Pain, Improper body mechanics, Impaired flexibility, Decreased strength, Postural dysfunction  Visit Diagnosis: 1. Cervicalgia   2. Other headache syndrome        Problem List Patient Active Problem List    Diagnosis Date Noted  . Cough in adult 03/20/2019  . 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 Pain in joint, multiple sites 11/18/2018  . DDD (degenerative disc disease), cervical 11/18/2018  . DDD (degenerative disc disease), lumbar 11/18/2018  . Positive ANA (antinuclear antibody) 11/18/2018  . Sicca syndrome ([place]) 11/18/2018  . History of colonic polyps 09/30/2018  . Chronic contact dermatitis 08/06/2018  . Chronic migraine without aura, with intractable migraine, so stated, with status migrainosus 11/28/2017  . URI (upper respiratory infection) 08/14/2017  . Fatigue 07/05/2017  . Insulin resistance 07/04/2017  . Vitamin D deficiency 05/08/2017  . Class 1 obesity without serious comorbidity with body mass index (BMI) of 30.0 to 30.9 in adult 04/24/2017  . Vasomotor symptoms due to menopause 04/19/2017  . Periodic limb movement  sleep disorder 03/28/2017  . Nocturnal hypoxemia 03/06/2017  . Morbid obesity ([place]) 03/06/2017  . Snoring 03/06/2017  . Gasping for breath 03/06/2017  . Sleep walking and eating 03/06/2017  . Episodic cluster headache, not intractable 03/06/2017  . Obesity (BMI 30.0-34.9) 01/29/2017  . Other insomnia 11/09/2016  . S/P left THA, AA 07/25/2016  . Irritable bowel syndrome with diarrhea 04/03/2016  . Chronic migraine 02/25/2016  . Left ventricular hypertrophy, mild 02/25/2016  . History of irritable bowel syndrome 02/15/2016  . 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 Osteoarthritis of left hip 11/01/2015  . Hearing loss of both ears 07/27/2015  . Post concussive syndrome 07/14/2015  . Depression (NOS) 07/02/2015  . Allergic rhinoconjunctivitis 07/02/2015  . Fibromyalgia syndrome 07/01/2015  . History of Clostridium difficile colitis, persistent 07/01/2015  . Impairment of balance 02/10/2015  . Essential hypertension 05/15/2013  . Hypothyroidism 05/15/2013  . Hyperlipidemia 05/15/2013  . Interstitial cystitis  05/15/2013  . Chronic migraine without aura 05/15/2013  . Spondylosis of lumbar region without myelopathy or radiculopathy 10/30/2011   Acelyn Basham C. Kimari Lienhard PT, DPT 04/08/19 7:50 PM   [place] [facility] [address] [place], [place], [address] Phone: [phone]   Fax:  [phone]  Name: Molly Giles MRN: [id number] Date of Birth: [birth year]

## 2019-04-11 ENCOUNTER — Other Ambulatory Visit: Payer: Self-pay

## 2019-04-11 ENCOUNTER — Encounter: Payer: Self-pay | Admitting: Physical Therapy

## 2019-04-11 ENCOUNTER — Ambulatory Visit: Payer: BC Managed Care – PPO | Admitting: Physical Therapy

## 2019-04-11 DIAGNOSIS — G4489 Other headache syndrome: Secondary | ICD-10-CM

## 2019-04-11 DIAGNOSIS — M542 Cervicalgia: Secondary | ICD-10-CM | POA: Diagnosis not present

## 2019-04-11 NOTE — Therapy (Signed)
[place], [place], [address] Phone: [phone]   Fax:  [phone]  Physical Therapy Treatment  Patient Details  Name: Molly Giles MRN: [id number] Date of Birth: 12-Jul-1956 Referring Provider (PT): Debbora Presto, NP   Encounter Date: 04/11/2019  PT End of Session - 04/11/19 0853    Visit Number  3    Number of Visits  17    Date for PT Re-Evaluation  05/30/19    Authorization Type  [employer]    PT Start Time  0850    PT Stop Time  0928    PT Time Calculation (min)  38 min    Activity Tolerance  Patient tolerated treatment well    Behavior During Therapy  [facility] for tasks assessed/performed       Past Medical History:  Diagnosis Date  . Allergic rhinoconjunctivitis 07/02/2015  . Arthritis   . Benign positional vertigo 04/2015   Responded well to [facility]  . Bruxism (teeth grinding)   . Chronic migraine 02/25/2016   Per Dr Jaynee Eagles review in notes from [facility] from September 2015. Showed total headache days last month 18. Severe headache days 7 days. Moderate headache days 5 days. Mild headache days last month sick days. Days without headache last month 10 days. Symptoms associated with photophobia, phonophobia, osmophobia, neck pain, dizziness, jaw pain, nasal congestion, vision disturbances, tingling and numbness, weakness and worsening with activity. Each headache attack last 3 hours depending on treatment in severity. Left side, the right side, easier side, the frontal area in the back of the head. Characterized as throbbing, pressure, tightness, squeezing, stabbing and burning   . Chronic migraine w/o aura w/o status migrainosus, not intractable 07/14/2015  . DDD (degenerative disc disease), cervical 11/18/2018  . DDD (degenerative disc disease), lumbar   . Degenerative cervical disc   . Depression   . Disc displacement, lumbar   . Dysrhythmia    seen by dr Wynonia Lawman- not a problem  since she has been on Bystolic  . Essential hypertension 05/15/2013  . Family history of adverse reaction to anesthesia    Brother- N/V  . Family history of premature CAD 05/15/2013  . Fibromyalgia   . GERD (gastroesophageal reflux disease) 12/16/2014  . H/O seasonal allergies   . Hammer toe    Left great toe  . Hearing loss of both ears 07/27/2015   mild to borderline moderate low frequency hearing loss improving to within normal limits bilaterally on audiology testing at [facility] in November 2016.    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 History of colonic polyps   . History of irritable bowel syndrome 02/15/2016  . History of migraine headaches 05/15/2013   Per Dr Jaynee Eagles review in notes from [facility] from September 2015. Showed total headache days last month 18. Severe headache days 7 days. Moderate headache days 5 days. Mild headache days last month sick days. Days without headache last month 10 days. Symptoms associated with photophobia, phonophobia, osmophobia, neck pain, dizziness, jaw pain, nasal congestion, vision disturbances, tingling and numbness, weakness and worsening with activity. Each headache attack last 3 hours depending on treatment in severity. Left side, the right side, easier side, the frontal area in the back of the head. Characterized as throbbing, pressure, tightness, squeezing, stabbing and burning    . Hx of bad fall 02/2015   Severe Facial/head trauma without fracture  . Hyperlipidemia  1998  . Hypertension 2004  . Hypothyroidism   . Impairment of balance 02/2015   Consequent of postconcussive syndrome  . Injury of triangular fibrocartilage complex of left wrist 02/25/2019   Dx 02/25/19 Iran Planas IV MD [facility])  . Interstitial cystitis   . 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[place]) 03/10/2018   [facility]  . Lumbar facet joint pain   . 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  Migraine 1962   Chronic Migraines  . Mood disorder ([place])   . Musculoskeletal neck pain 07/14/2015  . Normal coronary arteries 05/14/2014  . Osteoarthritis of left hip 11/01/2015   MRI order by Dr Alvan Dame (ortho) 10/2015 showed significant arthritis of left hip joint with cystic changes in femoral head c/w osteoarthritis  . 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 Overweight   . Pain in joint of left shoulder 11/09/2016  . Palpitations 05/15/2013   [facility] manages  . Periodic limb movement sleep disorder 03/28/2017  . Perirectal cyst 05/07/2016  . PONV (postoperative nausea and vomiting)   . Post concussion syndrome 06/06/2015  . Post concussive syndrome 07/14/2015   Ms Cremeans's post-concussive syndrome manifesting in vertigo and headache, mood changes, poor balance, dizziness, and decreased concentration per Dr Jaynee Eagles at [facility].   Marland Kitchen Posterior vitreous detachment of right eye 2014  . Shingles   . Shortness of breath dyspnea    with exertion  . Thyroid nodule 08/11/2009   Findings: The thyroid gland is within normal limits in size.  The gland is diffusely inhomogeneous. A small solid nodule is noted in the lower pole  medially on the right of 7 x 6 x 8 mm. A small solid nodule is noted inferiorly on the left of 3 x 3 x 4 mm.  IMPRESSION:  The thyroid gland is within normal limits in size with only small solid nodules present, the largest of only 8 mm in diameter on the right.    . 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   . Vasomotor symptoms due to menopause 04/19/2017    Past Surgical History:  Procedure Laterality Date  . Bladder dilitation     x 3  . BREAST BIOPSY Right 2011   Benign histology  . CARDIOVASCULAR STRESS TEST  2000   Unremarkable per pt report  . CARPOMETACARPAL JOINT ARTHROTOMY Right 2011  . COLONOSCOPY    . COLONOSCOPY WITH PROPOFOL N/A 04/21/2015    Procedure: COLONOSCOPY WITH PROPOFOL;  Surgeon: Carol Ada, MD;  Location: [place];  Service: Endoscopy;  Laterality: N/A;  . EPIDURAL BLOCK INJECTION Left 04/12/2016   Left Medial Nerve Block and Left L5 ramus block, Dr Suella Broad   . EPIDURAL BLOCK INJECTION  03/21/2016   Left L3-4 medial branch block and Left L5 & dorsal ramus block   . EPIDURAL BLOCK INJECTION N/A 10/25/2016   Suella Broad, MD. Lumbar medial branch block  . EPIDURAL BLOCK INJECTION N/A 02/09/2017   Suella Broad, MD.  Bilateral L3/4 medial branch block, bilateral L5 dorsal ramus block  . EPIDURAL BLOCK INJECTION N/A 07/04/2017   Suella Broad, MD  . FECAL TRANSPLANT  04/21/2015   Procedure: FECAL TRANSPLANT;  Surgeon: Carol Ada, MD;  Location: [place];  Service: Endoscopy;;  . HIP ARTHROPLASTY Left   . HIP ARTHROSCOPY Left 03/06/2018   Left hip arthroplasty, redo for loose hip arthroplasty. Procedure at [facility] hospital  . INJECTION HIP  INTRA ARTICULAR Left 11/2015   for OA by Dr Suella Broad  . OTHER SURGICAL HISTORY Left 2016   Left L3/L4 medial nerve block and Left L5 Dorsal Ramus block Dr Mickel Duhamel  . TOTAL HIP ARTHROPLASTY Left 07/25/2016   Procedure: LEFT TOTAL HIP ARTHROPLASTY ANTERIOR APPROACH;  Surgeon: Paralee Cancel, MD;  Location: [facility];  Service: Orthopedics;  Laterality: Left;    There were no vitals filed for this visit.  Subjective Assessment - 04/11/19 0852    Subjective  triceps are sore. Lt upper trap pain when turning left.    Patient Stated Goals  decrease pain, lift grandson, more activities with kids, be more active, kayak    Currently in Pain?  Yes    Pain Score  4     Pain Location  Neck    Pain Orientation  Left    Pain Descriptors / Indicators  Sore    Aggravating Factors   Lt cervical rotation                       [facility] Adult PT Treatment/Exercise - 04/11/19 0001      Manual Therapy   Manual Therapy  Manual Traction    Manual therapy  comments  skilled palpation and monitoring during TPDN    Joint Mobilization  prone thoracic unilateral PA C5-T 8, prone rib mobs1-8; supine lateral slides, C0 on 1 AP in Rt SB    Soft tissue mobilization  Lt upper trap    Manual Traction  cervical       Trigger Point Dry Needling - 04/11/19 0001    Upper Trapezius Response  Twitch reponse elicited;Palpable increased muscle length   Left          PT Education - 04/11/19 0928    Education Details  heel lift, anatomy of condition- biomechanical chain    Person(s) Educated  Patient    Methods  Explanation    Comprehension  Verbalized understanding       PT Short Term Goals - 04/01/19 1456      PT SHORT TERM GOAL #1   Title  cervical rotation within 5 deg Rt to Lt    Baseline  see flowsheet    Time  4    Period  Weeks    Status  New    Target Date  05/02/19      PT SHORT TERM GOAL #2   Title  pt will be able to present a proper resting posture without cues    Baseline  tactile and verbal cues required.    Time  4    Period  Weeks    Status  New    Target Date  05/02/19        PT Long Term Goals - 04/01/19 1457      PT LONG TERM GOAL #1   Title  Pt will be able to play with grand children without increase in pain the next day    Baseline  reports a few days of severe pain following    Time  8    Period  Weeks    Status  New    Target Date  05/30/19      PT LONG TERM GOAL #2   Title  Pt will be able to walk at least 1 mile without increase in pain    Baseline  pain at .5 miles at eval and would like to return to walking for exercise  Time  8    Period  Weeks    Status  New    Target Date  05/30/19      PT LONG TERM GOAL #3   Title  pt will be able to participate in outdoor activities    Baseline  avoids at this time due to pain and tightness that she feels after    Time  8    Period  Weeks    Status  New    Target Date  05/30/19      PT LONG TERM GOAL #4   Title  FOTO to 40% limited    Baseline   46% limited at eval    Time  8    Period  Weeks    Status  New    Target Date  05/30/19      PT LONG TERM GOAL #5   Title  .            Plan - 04/11/19 0929    Clinical Impression Statement  Significant mobility limitations in cervical Rt to Left glides, esp at C3, as well as decreased O on 1 AP in Rt sidebend. All addressed with manual therapy and pt reported soreness rather than sharp pain when rotating to the Left. Provided her with heel lift for Rt shoe- she says she was wearing a custom insert prior to THA but has not been wearing it, I suggested wearing the heel lift and night quard for TMJ consistently until her next appointment to see if mobility will hold.    PT Treatment/Interventions  ADLs/Self Care Home Management;Cryotherapy;Electrical Stimulation;Ultrasound;Moist Heat;Traction;Iontophoresis 4mg /ml Dexamethasone;Functional mobility training;Therapeutic activities;Therapeutic exercise;Neuromuscular re-education;Manual techniques;Patient/family education;Passive range of motion;Dry needling;Taping    PT Next Visit Plan  outcome of heel lift+night guard? progress HEP    PT Home Exercise Plan  scapular retraction-resting posture, avoid crossing legs, triceps extension, D2 flx band    Consulted and Agree with Plan of Care  Patient       Patient will benefit from skilled therapeutic intervention in order to improve the following deficits and impairments:  Decreased range of motion, Difficulty walking, Impaired UE functional use, Increased muscle spasms, Decreased activity tolerance, Pain, Improper body mechanics, Impaired flexibility, Decreased strength, Postural dysfunction  Visit Diagnosis: 1. Cervicalgia   2. Other headache syndrome        Problem List Patient Active Problem List   Diagnosis Date Noted  . Cough in adult 03/20/2019  . 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 Pain in joint, multiple sites 11/18/2018  . DDD (degenerative  disc disease), cervical 11/18/2018  . DDD (degenerative disc disease), lumbar 11/18/2018  . Positive ANA (antinuclear antibody) 11/18/2018  . Sicca syndrome ([place]) 11/18/2018  . History of colonic polyps 09/30/2018  . Chronic contact dermatitis 08/06/2018  . Chronic migraine without aura, with intractable migraine, so stated, with status migrainosus 11/28/2017  . URI (upper respiratory infection) 08/14/2017  . Fatigue 07/05/2017  . Insulin resistance 07/04/2017  . Vitamin D deficiency 05/08/2017  . Class 1 obesity without serious comorbidity with body mass index (BMI) of 30.0 to 30.9 in adult 04/24/2017  . Vasomotor symptoms due to menopause 04/19/2017  . Periodic limb movement sleep disorder 03/28/2017  . Nocturnal hypoxemia 03/06/2017  . Morbid obesity ([place]) 03/06/2017  . Snoring 03/06/2017  . Gasping for breath 03/06/2017  . Sleep walking and eating 03/06/2017  . Episodic cluster headache, not intractable 03/06/2017  . Obesity (BMI 30.0-34.9) 01/29/2017  .  Other insomnia 11/09/2016  . S/P left THA, AA 07/25/2016  . Irritable bowel syndrome with diarrhea 04/03/2016  . Chronic migraine 02/25/2016  . Left ventricular hypertrophy, mild 02/25/2016  . History of irritable bowel syndrome 02/15/2016  . 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 Osteoarthritis of left hip 11/01/2015  . Hearing loss of both ears 07/27/2015  . Post concussive syndrome 07/14/2015  . Depression (NOS) 07/02/2015  . Allergic rhinoconjunctivitis 07/02/2015  . Fibromyalgia syndrome 07/01/2015  . History of Clostridium difficile colitis, persistent 07/01/2015  . Impairment of balance 02/10/2015  . Essential hypertension 05/15/2013  . Hypothyroidism 05/15/2013  . Hyperlipidemia 05/15/2013  . Interstitial cystitis 05/15/2013  . Chronic migraine without aura 05/15/2013  . Spondylosis of lumbar region without myelopathy or radiculopathy 10/30/2011   Jaisa Defino C. Nasiah Polinsky PT, DPT 04/11/19 10:03  AM   [facility] [address] [place], [place], [address] Phone: [phone]   Fax:  [phone]  Name: Molly Giles MRN: [id number] Date of Birth: 06-23-1956

## 2019-04-15 ENCOUNTER — Other Ambulatory Visit: Payer: Self-pay

## 2019-04-15 ENCOUNTER — Encounter: Payer: Self-pay | Admitting: Physical Therapy

## 2019-04-15 ENCOUNTER — Ambulatory Visit: Payer: BC Managed Care – PPO | Attending: Family Medicine | Admitting: Physical Therapy

## 2019-04-15 DIAGNOSIS — M542 Cervicalgia: Secondary | ICD-10-CM | POA: Diagnosis present

## 2019-04-15 DIAGNOSIS — R269 Unspecified abnormalities of gait and mobility: Secondary | ICD-10-CM | POA: Diagnosis present

## 2019-04-15 DIAGNOSIS — G4489 Other headache syndrome: Secondary | ICD-10-CM | POA: Diagnosis present

## 2019-04-15 NOTE — Therapy (Signed)
[facility] [place], [place], [address] Phone: [phone]   Fax:  [phone]  Physical Therapy Treatment  Patient Details  Name: Molly Giles MRN: [id number] Date of Birth: 1956-04-08 Referring Provider (PT): Debbora Presto, NP   Encounter Date: 04/15/2019  PT End of Session - 04/15/19 1336    Visit Number  4    Number of Visits  17    Date for PT Re-Evaluation  05/30/19    Authorization Type  [employer]    PT Start Time  1333    PT Stop Time  1413    PT Time Calculation (min)  40 min    Activity Tolerance  Patient tolerated treatment well    Behavior During Therapy  [facility] for tasks assessed/performed       Past Medical History:  Diagnosis Date  . Allergic rhinoconjunctivitis 07/02/2015  . Arthritis   . Benign positional vertigo 04/2015   Responded well to [facility]  . Bruxism (teeth grinding)   . Chronic migraine 02/25/2016   Per Dr Jaynee Eagles review in notes from [facility] from September 2015. Showed total headache days last month 18. Severe headache days 7 days. Moderate headache days 5 days. Mild headache days last month sick days. Days without headache last month 10 days. Symptoms associated with photophobia, phonophobia, osmophobia, neck pain, dizziness, jaw pain, nasal congestion, vision disturbances, tingling and numbness, weakness and worsening with activity. Each headache attack last 3 hours depending on treatment in severity. Left side, the right side, easier side, the frontal area in the back of the head. Characterized as throbbing, pressure, tightness, squeezing, stabbing and burning   . Chronic migraine w/o aura w/o status migrainosus, not intractable 07/14/2015  . DDD (degenerative disc disease), cervical 11/18/2018  . DDD (degenerative disc disease), lumbar   . Degenerative cervical disc   . Depression   . Disc displacement, lumbar   . Dysrhythmia    seen by dr Wynonia Lawman- not a problem  since she has been on Bystolic  . Essential hypertension 05/15/2013  . Family history of adverse reaction to anesthesia    Brother- N/V  . Family history of premature CAD 05/15/2013  . Fibromyalgia   . GERD (gastroesophageal reflux disease) 12/16/2014  . H/O seasonal allergies   . Hammer toe    Left great toe  . Hearing loss of both ears 07/27/2015   mild to borderline moderate low frequency hearing loss improving to within normal limits bilaterally on audiology testing at [facility] in November 2016.    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 History of colonic polyps   . History of irritable bowel syndrome 02/15/2016  . History of migraine headaches 05/15/2013   Per Dr Jaynee Eagles review in notes from [facility] from September 2015. Showed total headache days last month 18. Severe headache days 7 days. Moderate headache days 5 days. Mild headache days last month sick days. Days without headache last month 10 days. Symptoms associated with photophobia, phonophobia, osmophobia, neck pain, dizziness, jaw pain, nasal congestion, vision disturbances, tingling and numbness, weakness and worsening with activity. Each headache attack last 3 hours depending on treatment in severity. Left side, the right side, easier side, the frontal area in the back of the head. Characterized as throbbing, pressure, tightness, squeezing, stabbing and burning    . Hx of bad fall 02/2015   Severe Facial/head trauma without fracture  . Hyperlipidemia  1998  . Hypertension 2004  . Hypothyroidism   . Impairment of balance 02/2015   Consequent of postconcussive syndrome  . Injury of triangular fibrocartilage complex of left wrist 02/25/2019   Dx 02/25/19 Iran Planas IV MD [facility])  . Interstitial cystitis   . 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[place]) 03/10/2018   [facility]  . Lumbar facet joint pain   . 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  Migraine 1962   Chronic Migraines  . Mood disorder ([place])   . Musculoskeletal neck pain 07/14/2015  . Normal coronary arteries 05/14/2014  . Osteoarthritis of left hip 11/01/2015   MRI order by Dr Alvan Dame (ortho) 10/2015 showed significant arthritis of left hip joint with cystic changes in femoral head c/w osteoarthritis  . 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 Overweight   . Pain in joint of left shoulder 11/09/2016  . Palpitations 05/15/2013   [facility] manages  . Periodic limb movement sleep disorder 03/28/2017  . Perirectal cyst 05/07/2016  . PONV (postoperative nausea and vomiting)   . Post concussion syndrome 06/06/2015  . Post concussive syndrome 07/14/2015   Ms Dicostanzo's post-concussive syndrome manifesting in vertigo and headache, mood changes, poor balance, dizziness, and decreased concentration per Dr Jaynee Eagles at [facility].   Marland Kitchen Posterior vitreous detachment of right eye 2014  . Shingles   . Shortness of breath dyspnea    with exertion  . Thyroid nodule 08/11/2009   Findings: The thyroid gland is within normal limits in size.  The gland is diffusely inhomogeneous. A small solid nodule is noted in the lower pole  medially on the right of 7 x 6 x 8 mm. A small solid nodule is noted inferiorly on the left of 3 x 3 x 4 mm.  IMPRESSION:  The thyroid gland is within normal limits in size with only small solid nodules present, the largest of only 8 mm in diameter on the right.    . 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   . Vasomotor symptoms due to menopause 04/19/2017    Past Surgical History:  Procedure Laterality Date  . Bladder dilitation     x 3  . BREAST BIOPSY Right 2011   Benign histology  . CARDIOVASCULAR STRESS TEST  2000   Unremarkable per pt report  . CARPOMETACARPAL JOINT ARTHROTOMY Right 2011  . COLONOSCOPY    . COLONOSCOPY WITH PROPOFOL N/A 04/21/2015    Procedure: COLONOSCOPY WITH PROPOFOL;  Surgeon: Carol Ada, MD;  Location: [name];  Service: Endoscopy;  Laterality: N/A;  . EPIDURAL BLOCK INJECTION Left 04/12/2016   Left Medial Nerve Block and Left L5 ramus block, Dr Suella Broad   . EPIDURAL BLOCK INJECTION  03/21/2016   Left L3-4 medial branch block and Left L5 & dorsal ramus block   . EPIDURAL BLOCK INJECTION N/A 10/25/2016   Suella Broad, MD. Lumbar medial branch block  . EPIDURAL BLOCK INJECTION N/A 02/09/2017   Suella Broad, MD.  Bilateral L3/4 medial branch block, bilateral L5 dorsal ramus block  . EPIDURAL BLOCK INJECTION N/A 07/04/2017   Suella Broad, MD  . FECAL TRANSPLANT  04/21/2015   Procedure: FECAL TRANSPLANT;  Surgeon: Carol Ada, MD;  Location: [facility];  Service: Endoscopy;;  . HIP ARTHROPLASTY Left   . HIP ARTHROSCOPY Left 03/06/2018   Left hip arthroplasty, redo for loose hip arthroplasty. Procedure at [facility] hospital  . INJECTION HIP  INTRA ARTICULAR Left 11/2015   for OA by Dr Suella Broad  . OTHER SURGICAL HISTORY Left 2016   Left L3/L4 medial nerve block and Left L5 Dorsal Ramus block Dr Mickel Duhamel  . TOTAL HIP ARTHROPLASTY Left 07/25/2016   Procedure: LEFT TOTAL HIP ARTHROPLASTY ANTERIOR APPROACH;  Surgeon: Paralee Cancel, MD;  Location: [facility];  Service: Orthopedics;  Laterality: Left;    There were no vitals filed for this visit.  Subjective Assessment - 04/15/19 1337    Subjective  My right side is a little sore today and I have a mild HA on Rt side in suboccipital region. Was sore after DN last time, no noticable difference with heel lift but its comfortable and I still have it in my shoe.    Currently in Pain?  Yes    Pain Score  6     Pain Location  Neck    Pain Orientation  Right    Pain Descriptors / Indicators  Headache    Aggravating Factors   Rt cervical rotation (different today)         [facility] PT Assessment - 04/15/19 0001      AROM   Cervical Flexion  40    Cervical  - Right Side Bend  34    Cervical - Left Side Bend  32    Cervical - Right Rotation  65    Cervical - Left Rotation  64                   [facility] Adult PT Treatment/Exercise - 04/15/19 0001      Neck Exercises: Seated   W Back  5 reps    Other Seated Exercise  thoracic extension over towel      Neck Exercises: Prone   UE Flexion with Stabilization Limitations  single x10 3s holds, double x10 5s holds (attempted in child pose but not appropriate at this time)    Other Prone Exercise  prone UE/LE lift      Manual Therapy   Manual therapy comments  skilled palpation and monitoring during TPDN    Joint Mobilization  prone thoracic PA    Soft tissue mobilization  bil upper trap               PT Short Term Goals - 04/01/19 1456      PT SHORT TERM GOAL #1   Title  cervical rotation within 5 deg Rt to Lt    Baseline  see flowsheet    Time  4    Period  Weeks    Status  New    Target Date  05/02/19      PT SHORT TERM GOAL #2   Title  pt will be able to present a proper resting posture without cues    Baseline  tactile and verbal cues required.    Time  4    Period  Weeks    Status  New    Target Date  05/02/19        PT Long Term Goals - 04/01/19 1457      PT LONG TERM GOAL #1   Title  Pt will be able to play with grand children without increase in pain the next day    Baseline  reports a few days of severe pain following    Time  8    Period  Weeks    Status  New    Target Date  05/30/19  PT LONG TERM GOAL #2   Title  Pt will be able to walk at least 1 mile without increase in pain    Baseline  pain at .5 miles at eval and would like to return to walking for exercise    Time  8    Period  Weeks    Status  New    Target Date  05/30/19      PT LONG TERM GOAL #3   Title  pt will be able to participate in outdoor activities    Baseline  avoids at this time due to pain and tightness that she feels after    Time  8    Period  Weeks    Status   New    Target Date  05/30/19      PT LONG TERM GOAL #4   Title  FOTO to 40% limited    Baseline  46% limited at eval    Time  8    Period  Weeks    Status  New    Target Date  05/30/19      PT LONG TERM GOAL #5   Title  .            Plan - 04/15/19 1412    Clinical Impression Statement  Significant improvement in equality of cervical motions, continued to progress strengthening today. Pt reports feeling that it is more natural to sit upright and she demo more consistent postural alignment.    PT Treatment/Interventions  ADLs/Self Care Home Management;Cryotherapy;Electrical Stimulation;Ultrasound;Moist Heat;Traction;Iontophoresis 4mg /ml Dexamethasone;Functional mobility training;Therapeutic activities;Therapeutic exercise;Neuromuscular re-education;Manual techniques;Patient/family education;Passive range of motion;Dry needling;Taping    PT Next Visit Plan  continue strengthening    PT Home Exercise Plan  scapular retraction-resting posture, avoid crossing legs, triceps extension, D2 flx band, wall push ups, prone GHJ flx, prone UE/LE lift, thoracic extension, seated W    Consulted and Agree with Plan of Care  Patient       Patient will benefit from skilled therapeutic intervention in order to improve the following deficits and impairments:  Decreased range of motion, Difficulty walking, Impaired UE functional use, Increased muscle spasms, Decreased activity tolerance, Pain, Improper body mechanics, Impaired flexibility, Decreased strength, Postural dysfunction  Visit Diagnosis: 1. Cervicalgia   2. Other headache syndrome        Problem List Patient Active Problem List   Diagnosis Date Noted  . Cough in adult 03/20/2019  . 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 Pain in joint, multiple sites 11/18/2018  . DDD (degenerative disc disease), cervical 11/18/2018  . DDD (degenerative disc disease), lumbar 11/18/2018  . Positive ANA (antinuclear  antibody) 11/18/2018  . Sicca syndrome ([place]) 11/18/2018  . History of colonic polyps 09/30/2018  . Chronic contact dermatitis 08/06/2018  . Chronic migraine without aura, with intractable migraine, so stated, with status migrainosus 11/28/2017  . URI (upper respiratory infection) 08/14/2017  . Fatigue 07/05/2017  . Insulin resistance 07/04/2017  . Vitamin D deficiency 05/08/2017  . Class 1 obesity without serious comorbidity with body mass index (BMI) of 30.0 to 30.9 in adult 04/24/2017  . Vasomotor symptoms due to menopause 04/19/2017  . Periodic limb movement sleep disorder 03/28/2017  . Nocturnal hypoxemia 03/06/2017  . Morbid obesity ([place]) 03/06/2017  . Snoring 03/06/2017  . Gasping for breath 03/06/2017  . Sleep walking and eating 03/06/2017  . Episodic cluster headache, not intractable 03/06/2017  . Obesity (BMI 30.0-34.9) 01/29/2017  . Other insomnia 11/09/2016  .  S/P left THA, AA 07/25/2016  . Irritable bowel syndrome with diarrhea 04/03/2016  . Chronic migraine 02/25/2016  . Left ventricular hypertrophy, mild 02/25/2016  . History of irritable bowel syndrome 02/15/2016  . 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 Osteoarthritis of left hip 11/01/2015  . Hearing loss of both ears 07/27/2015  . Post concussive syndrome 07/14/2015  . Depression (NOS) 07/02/2015  . Allergic rhinoconjunctivitis 07/02/2015  . Fibromyalgia syndrome 07/01/2015  . History of Clostridium difficile colitis, persistent 07/01/2015  . Impairment of balance 02/10/2015  . Essential hypertension 05/15/2013  . Hypothyroidism 05/15/2013  . Hyperlipidemia 05/15/2013  . Interstitial cystitis 05/15/2013  . Chronic migraine without aura 05/15/2013  . Spondylosis of lumbar region without myelopathy or radiculopathy 10/30/2011    Sharilyn Geisinger C. Miloh Alcocer PT, DPT 04/15/19 3:01 PM   [facility] [place], [place], [address] Phone:  [phone]   Fax:  [phone]  Name: Dianne Whelchel MRN: [id number] Date of Birth: June 09, 1956

## 2019-04-17 ENCOUNTER — Other Ambulatory Visit: Payer: Self-pay

## 2019-04-17 ENCOUNTER — Encounter: Payer: Self-pay | Admitting: Physical Therapy

## 2019-04-17 ENCOUNTER — Ambulatory Visit: Payer: BC Managed Care – PPO | Admitting: Physical Therapy

## 2019-04-17 DIAGNOSIS — M542 Cervicalgia: Secondary | ICD-10-CM

## 2019-04-17 DIAGNOSIS — G4489 Other headache syndrome: Secondary | ICD-10-CM

## 2019-04-17 NOTE — Therapy (Signed)
[place], [place], [address] Phone: [phone]   Fax:  [phone]  Physical Therapy Treatment  Patient Details  Name: Molly Giles MRN: [id number] Date of Birth: Jul 20, 1956 Referring Provider (PT): Debbora Presto, NP   Encounter Date: 04/17/2019  PT End of Session - 04/17/19 1546    Visit Number  5    Number of Visits  17    Date for PT Re-Evaluation  05/30/19    Authorization Type  [employer]    PT Start Time  0302    PT Stop Time  0345    PT Time Calculation (min)  43 min    Activity Tolerance  Patient tolerated treatment well    Behavior During Therapy  [facility] for tasks assessed/performed       Past Medical History:  Diagnosis Date  . Allergic rhinoconjunctivitis 07/02/2015  . Arthritis   . Benign positional vertigo 04/2015   Responded well to [facility]  . Bruxism (teeth grinding)   . Chronic migraine 02/25/2016   Per Dr Jaynee Eagles review in notes from [facility] from September 2015. Showed total headache days last month 18. Severe headache days 7 days. Moderate headache days 5 days. Mild headache days last month sick days. Days without headache last month 10 days. Symptoms associated with photophobia, phonophobia, osmophobia, neck pain, dizziness, jaw pain, nasal congestion, vision disturbances, tingling and numbness, weakness and worsening with activity. Each headache attack last 3 hours depending on treatment in severity. Left side, the right side, easier side, the frontal area in the back of the head. Characterized as throbbing, pressure, tightness, squeezing, stabbing and burning   . Chronic migraine w/o aura w/o status migrainosus, not intractable 07/14/2015  . DDD (degenerative disc disease), cervical 11/18/2018  . DDD (degenerative disc disease), lumbar   . Degenerative cervical disc   . Depression   . Disc displacement, lumbar   . Dysrhythmia    seen by dr Wynonia Lawman- not a problem  since she has been on Bystolic  . Essential hypertension 05/15/2013  . Family history of adverse reaction to anesthesia    Brother- N/V  . Family history of premature CAD 05/15/2013  . Fibromyalgia   . GERD (gastroesophageal reflux disease) 12/16/2014  . H/O seasonal allergies   . Hammer toe    Left great toe  . Hearing loss of both ears 07/27/2015   mild to borderline moderate low frequency hearing loss improving to within normal limits bilaterally on audiology testing at [facility] in November 2016.    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 History of colonic polyps   . History of irritable bowel syndrome 02/15/2016  . History of migraine headaches 05/15/2013   Per Dr Jaynee Eagles review in notes from [facility] from September 2015. Showed total headache days last month 18. Severe headache days 7 days. Moderate headache days 5 days. Mild headache days last month sick days. Days without headache last month 10 days. Symptoms associated with photophobia, phonophobia, osmophobia, neck pain, dizziness, jaw pain, nasal congestion, vision disturbances, tingling and numbness, weakness and worsening with activity. Each headache attack last 3 hours depending on treatment in severity. Left side, the right side, easier side, the frontal area in the back of the head. Characterized as throbbing, pressure, tightness, squeezing, stabbing and burning    . Hx of bad fall 02/2015   Severe Facial/head trauma without fracture  . Hyperlipidemia  1998  . Hypertension 2004  . Hypothyroidism   . Impairment of balance 02/2015   Consequent of postconcussive syndrome  . Injury of triangular fibrocartilage complex of left wrist 02/25/2019   Dx 02/25/19 Iran Planas IV MD [facility])  . Interstitial cystitis   . 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[place]) 03/10/2018   [facility]  . Lumbar facet joint pain   . 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  Migraine 1962   Chronic Migraines  . Mood disorder ([place])   . Musculoskeletal neck pain 07/14/2015  . Normal coronary arteries 05/14/2014  . Osteoarthritis of left hip 11/01/2015   MRI order by Dr Alvan Dame (ortho) 10/2015 showed significant arthritis of left hip joint with cystic changes in femoral head c/w osteoarthritis  . 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 Overweight   . Pain in joint of left shoulder 11/09/2016  . Palpitations 05/15/2013   [facility] manages  . Periodic limb movement sleep disorder 03/28/2017  . Perirectal cyst 05/07/2016  . PONV (postoperative nausea and vomiting)   . Post concussion syndrome 06/06/2015  . Post concussive syndrome 07/14/2015   Ms Sides's post-concussive syndrome manifesting in vertigo and headache, mood changes, poor balance, dizziness, and decreased concentration per Dr Jaynee Eagles at [facility].   Marland Kitchen Posterior vitreous detachment of right eye 2014  . Shingles   . Shortness of breath dyspnea    with exertion  . Thyroid nodule 08/11/2009   Findings: The thyroid gland is within normal limits in size.  The gland is diffusely inhomogeneous. A small solid nodule is noted in the lower pole  medially on the right of 7 x 6 x 8 mm. A small solid nodule is noted inferiorly on the left of 3 x 3 x 4 mm.  IMPRESSION:  The thyroid gland is within normal limits in size with only small solid nodules present, the largest of only 8 mm in diameter on the right.    . 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   . Vasomotor symptoms due to menopause 04/19/2017    Past Surgical History:  Procedure Laterality Date  . Bladder dilitation     x 3  . BREAST BIOPSY Right 2011   Benign histology  . CARDIOVASCULAR STRESS TEST  2000   Unremarkable per pt report  . CARPOMETACARPAL JOINT ARTHROTOMY Right 2011  . COLONOSCOPY    . COLONOSCOPY WITH PROPOFOL N/A 04/21/2015    Procedure: COLONOSCOPY WITH PROPOFOL;  Surgeon: Carol Ada, MD;  Location: [facility];  Service: Endoscopy;  Laterality: N/A;  . EPIDURAL BLOCK INJECTION Left 04/12/2016   Left Medial Nerve Block and Left L5 ramus block, Dr Suella Broad   . EPIDURAL BLOCK INJECTION  03/21/2016   Left L3-4 medial branch block and Left L5 & dorsal ramus block   . EPIDURAL BLOCK INJECTION N/A 10/25/2016   Suella Broad, MD. Lumbar medial branch block  . EPIDURAL BLOCK INJECTION N/A 02/09/2017   Suella Broad, MD.  Bilateral L3/4 medial branch block, bilateral L5 dorsal ramus block  . EPIDURAL BLOCK INJECTION N/A 07/04/2017   Suella Broad, MD  . FECAL TRANSPLANT  04/21/2015   Procedure: FECAL TRANSPLANT;  Surgeon: Carol Ada, MD;  Location: [facility];  Service: Endoscopy;;  . HIP ARTHROPLASTY Left   . HIP ARTHROSCOPY Left 03/06/2018   Left hip arthroplasty, redo for loose hip arthroplasty. Procedure [facility]  . INJECTION HIP  INTRA ARTICULAR Left 11/2015   for OA by Dr Suella Broad  . OTHER SURGICAL HISTORY Left 2016   Left L3/L4 medial nerve block and Left L5 Dorsal Ramus block Dr Mickel Duhamel  . TOTAL HIP ARTHROPLASTY Left 07/25/2016   Procedure: LEFT TOTAL HIP ARTHROPLASTY ANTERIOR APPROACH;  Surgeon: Paralee Cancel, MD;  Location: [facility];  Service: Orthopedics;  Laterality: Left;    There were no vitals filed for this visit.  Subjective Assessment - 04/17/19 1504    Subjective  I felt it where we needled last time but overall I feel good.    Patient Stated Goals  decrease pain, lift grandson, more activities with kids, be more active, kayak    Currently in Pain?  No/denies                       [facility] Adult PT Treatment/Exercise - 04/17/19 0001      Neck Exercises: Standing   Wall Push Ups Limitations  wall walks yellow tband    Other Standing Exercises  extension red tband, ER red tband towel under arm    Other Standing Exercises  triceps ext with band       Neck Exercises: Prone   Neck Retraction Limitations  retraction from forehead on hands    Other Prone Exercise  prone UE/LE lift      Manual Therapy   Soft tissue mobilization  suboccipitals    Manual Traction  cervical             PT Education - 04/17/19 2112    Education Details  lifting/carrying grand children, HEP, POC    Person(s) Educated  Patient    Methods  Explanation;Demonstration;Tactile cues;Verbal cues;Handout    Comprehension  Tactile cues required;Verbal cues required;Returned demonstration;Verbalized understanding;Need further instruction       PT Short Term Goals - 04/01/19 1456      PT SHORT TERM GOAL #1   Title  cervical rotation within 5 deg Rt to Lt    Baseline  see flowsheet    Time  4    Period  Weeks    Status  New    Target Date  05/02/19      PT SHORT TERM GOAL #2   Title  pt will be able to present a proper resting posture without cues    Baseline  tactile and verbal cues required.    Time  4    Period  Weeks    Status  New    Target Date  05/02/19        PT Long Term Goals - 04/01/19 1457      PT LONG TERM GOAL #1   Title  Pt will be able to play with grand children without increase in pain the next day    Baseline  reports a few days of severe pain following    Time  8    Period  Weeks    Status  New    Target Date  05/30/19      PT LONG TERM GOAL #2   Title  Pt will be able to walk at least 1 mile without increase in pain    Baseline  pain at .5 miles at eval and would like to return to walking for exercise    Time  8    Period  Weeks    Status  New    Target Date  05/30/19      PT  LONG TERM GOAL #3   Title  pt will be able to participate in outdoor activities    Baseline  avoids at this time due to pain and tightness that she feels after    Time  8    Period  Weeks    Status  New    Target Date  05/30/19      PT LONG TERM GOAL #4   Title  FOTO to 40% limited    Baseline  46% limited at eval    Time  8    Period   Weeks    Status  New    Target Date  05/30/19      PT LONG TERM GOAL #5   Title  .            Plan - 04/17/19 1546    Clinical Impression Statement  Did not require DN today and is doing well with HA pain. At this time, we will decrease treatment to 1 next week. She is seeing her grand children this morning so we discussed being careful lifting and will continue POC at next appointment.    PT Treatment/Interventions  ADLs/Self Care Home Management;Cryotherapy;Electrical Stimulation;Ultrasound;Moist Heat;Traction;Iontophoresis 4mg /ml Dexamethasone;Functional mobility training;Therapeutic activities;Therapeutic exercise;Neuromuscular re-education;Manual techniques;Patient/family education;Passive range of motion;Dry needling;Taping    PT Next Visit Plan  outcome of 1 week off? lifting grand kids go ok? practice lifting    PT Home Exercise Plan  scapular retraction-resting posture, avoid crossing legs, triceps extension, D2 flx band, wall push ups, prone GHJ flx, prone UE/LE lift, thoracic extension, seated W, row, ER, resisted wall step/return    Consulted and Agree with Plan of Care  Patient       Patient will benefit from skilled therapeutic intervention in order to improve the following deficits and impairments:  Decreased range of motion, Difficulty walking, Impaired UE functional use, Increased muscle spasms, Decreased activity tolerance, Pain, Improper body mechanics, Impaired flexibility, Decreased strength, Postural dysfunction  Visit Diagnosis: 1. Cervicalgia   2. Other headache syndrome        Problem List Patient Active Problem List   Diagnosis Date Noted  . Cough in adult 03/20/2019  . 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 Pain in joint, multiple sites 11/18/2018  . DDD (degenerative disc disease), cervical 11/18/2018  . DDD (degenerative disc disease), lumbar 11/18/2018  . Positive ANA (antinuclear antibody) 11/18/2018  . 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 History of colonic polyps 09/30/2018  . Chronic contact dermatitis 08/06/2018  . Chronic migraine without aura, with intractable migraine, so stated, with status migrainosus 11/28/2017  . URI (upper respiratory infection) 08/14/2017  . Fatigue 07/05/2017  . Insulin resistance 07/04/2017  . Vitamin D deficiency 05/08/2017  . Class 1 obesity without serious comorbidity with body mass index (BMI) of 30.0 to 30.9 in adult 04/24/2017  . Vasomotor symptoms due to menopause 04/19/2017  . Periodic limb movement sleep disorder 03/28/2017  . Nocturnal hypoxemia 03/06/2017  . Morbid obesity ([place]) 03/06/2017  . Snoring 03/06/2017  . Gasping for breath 03/06/2017  . Sleep walking and eating 03/06/2017  . Episodic cluster headache, not intractable 03/06/2017  . Obesity (BMI 30.0-34.9) 01/29/2017  . Other insomnia 11/09/2016  . S/P left THA, AA 07/25/2016  . Irritable bowel syndrome with diarrhea 04/03/2016  . Chronic migraine 02/25/2016  . Left ventricular hypertrophy, mild 02/25/2016  . History of irritable bowel syndrome 02/15/2016  . 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  Osteoarthritis of left hip 11/01/2015  . Hearing loss of both ears 07/27/2015  . Post concussive syndrome 07/14/2015  . Depression (NOS) 07/02/2015  . Allergic rhinoconjunctivitis 07/02/2015  . Fibromyalgia syndrome 07/01/2015  . History of Clostridium difficile colitis, persistent 07/01/2015  . Impairment of balance 02/10/2015  . Essential hypertension 05/15/2013  . Hypothyroidism 05/15/2013  . Hyperlipidemia 05/15/2013  . Interstitial cystitis 05/15/2013  . Chronic migraine without aura 05/15/2013  . Spondylosis of lumbar region without myelopathy or radiculopathy 10/30/2011   Jatoria Kneeland C. Javon Snee PT, DPT 04/17/19 9:14 PM   [facility] [address] [place], [place], [address] Phone: [phone]   Fax:   [phone]  Name: Molly Giles MRN: [id number] Date of Birth: 1956-03-19

## 2019-04-22 ENCOUNTER — Ambulatory Visit: Payer: BC Managed Care – PPO | Admitting: Physical Therapy

## 2019-04-24 ENCOUNTER — Other Ambulatory Visit: Payer: Self-pay

## 2019-04-24 ENCOUNTER — Encounter: Payer: Self-pay | Admitting: Physical Therapy

## 2019-04-24 ENCOUNTER — Ambulatory Visit: Payer: BC Managed Care – PPO | Admitting: Physical Therapy

## 2019-04-24 DIAGNOSIS — G4489 Other headache syndrome: Secondary | ICD-10-CM

## 2019-04-24 DIAGNOSIS — R269 Unspecified abnormalities of gait and mobility: Secondary | ICD-10-CM

## 2019-04-24 DIAGNOSIS — M542 Cervicalgia: Secondary | ICD-10-CM | POA: Diagnosis not present

## 2019-04-24 NOTE — Therapy (Signed)
[place], [place], [address] Phone: [phone]   Fax:  [phone]  Physical Therapy Treatment  Patient Details  Name: Molly Giles MRN: [id number] Date of Birth: [birth year] Referring Provider (PT): Debbora Presto, NP   Encounter Date: 04/24/2019  PT End of Session - 04/24/19 1414    Visit Number  6    Number of Visits  17    Date for PT Re-Evaluation  05/30/19    Authorization Type  [employer]    PT Start Time  1333    PT Stop Time  1414    PT Time Calculation (min)  41 min    Activity Tolerance  Patient tolerated treatment well    Behavior During Therapy  [facility] for tasks assessed/performed       Past Medical History:  Diagnosis Date  . Allergic rhinoconjunctivitis 07/02/2015  . Arthritis   . Benign positional vertigo 04/2015   Responded well to [facility]  . Bruxism (teeth grinding)   . Chronic migraine 02/25/2016   Per Dr Jaynee Eagles review in notes from [facility] from September 2015. Showed total headache days last month 18. Severe headache days 7 days. Moderate headache days 5 days. Mild headache days last month sick days. Days without headache last month 10 days. Symptoms associated with photophobia, phonophobia, osmophobia, neck pain, dizziness, jaw pain, nasal congestion, vision disturbances, tingling and numbness, weakness and worsening with activity. Each headache attack last 3 hours depending on treatment in severity. Left side, the right side, easier side, the frontal area in the back of the head. Characterized as throbbing, pressure, tightness, squeezing, stabbing and burning   . Chronic migraine w/o aura w/o status migrainosus, not intractable 07/14/2015  . DDD (degenerative disc disease), cervical 11/18/2018  . DDD (degenerative disc disease), lumbar   . Degenerative cervical disc   . Depression   . Disc displacement, lumbar   . Dysrhythmia    seen by dr Wynonia Lawman- not a problem  since she has been on Bystolic  . Essential hypertension 05/15/2013  . Family history of adverse reaction to anesthesia    Brother- N/V  . Family history of premature CAD 05/15/2013  . Fibromyalgia   . GERD (gastroesophageal reflux disease) 12/16/2014  . H/O seasonal allergies   . Hammer toe    Left great toe  . Hearing loss of both ears 07/27/2015   mild to borderline moderate low frequency hearing loss improving to within normal limits bilaterally on audiology testing at [facility] in November 2016.    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 History of colonic polyps   . History of irritable bowel syndrome 02/15/2016  . History of migraine headaches 05/15/2013   Per Dr Jaynee Eagles review in notes from [facility] from September 2015. Showed total headache days last month 18. Severe headache days 7 days. Moderate headache days 5 days. Mild headache days last month sick days. Days without headache last month 10 days. Symptoms associated with photophobia, phonophobia, osmophobia, neck pain, dizziness, jaw pain, nasal congestion, vision disturbances, tingling and numbness, weakness and worsening with activity. Each headache attack last 3 hours depending on treatment in severity. Left side, the right side, easier side, the frontal area in the back of the head. Characterized as throbbing, pressure, tightness, squeezing, stabbing and burning    . Hx of bad fall 02/2015   Severe Facial/head trauma without fracture  . Hyperlipidemia  1998  . Hypertension 2004  . Hypothyroidism   . Impairment of balance 02/2015   Consequent of postconcussive syndrome  . Injury of triangular fibrocartilage complex of left wrist 02/25/2019   Dx 02/25/19 Iran Planas IV MD [facility])  . Interstitial cystitis   . 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[place]) 03/10/2018   [facility]  . Lumbar facet joint pain   . 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  Migraine 1962   Chronic Migraines  . Mood disorder ([place])   . Musculoskeletal neck pain 07/14/2015  . Normal coronary arteries 05/14/2014  . Osteoarthritis of left hip 11/01/2015   MRI order by Dr Alvan Dame (ortho) 10/2015 showed significant arthritis of left hip joint with cystic changes in femoral head c/w osteoarthritis  . 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 Overweight   . Pain in joint of left shoulder 11/09/2016  . Palpitations 05/15/2013   [facility] manages  . Periodic limb movement sleep disorder 03/28/2017  . Perirectal cyst 05/07/2016  . PONV (postoperative nausea and vomiting)   . Post concussion syndrome 06/06/2015  . Post concussive syndrome 07/14/2015   Ms Manganello's post-concussive syndrome manifesting in vertigo and headache, mood changes, poor balance, dizziness, and decreased concentration per Dr Jaynee Eagles at [facility].   Marland Kitchen Posterior vitreous detachment of right eye 2014  . Shingles   . Shortness of breath dyspnea    with exertion  . Thyroid nodule 08/11/2009   Findings: The thyroid gland is within normal limits in size.  The gland is diffusely inhomogeneous. A small solid nodule is noted in the lower pole  medially on the right of 7 x 6 x 8 mm. A small solid nodule is noted inferiorly on the left of 3 x 3 x 4 mm.  IMPRESSION:  The thyroid gland is within normal limits in size with only small solid nodules present, the largest of only 8 mm in diameter on the right.    . 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   . Vasomotor symptoms due to menopause 04/19/2017    Past Surgical History:  Procedure Laterality Date  . Bladder dilitation     x 3  . BREAST BIOPSY Right 2011   Benign histology  . CARDIOVASCULAR STRESS TEST  2000   Unremarkable per pt report  . CARPOMETACARPAL JOINT ARTHROTOMY Right 2011  . COLONOSCOPY    . COLONOSCOPY WITH PROPOFOL N/A 04/21/2015    Procedure: COLONOSCOPY WITH PROPOFOL;  Surgeon: Carol Ada, MD;  Location: [facility];  Service: Endoscopy;  Laterality: N/A;  . EPIDURAL BLOCK INJECTION Left 04/12/2016   Left Medial Nerve Block and Left L5 ramus block, Dr Suella Broad   . EPIDURAL BLOCK INJECTION  03/21/2016   Left L3-4 medial branch block and Left L5 & dorsal ramus block   . EPIDURAL BLOCK INJECTION N/A 10/25/2016   Suella Broad, MD. Lumbar medial branch block  . EPIDURAL BLOCK INJECTION N/A 02/09/2017   Suella Broad, MD.  Bilateral L3/4 medial branch block, bilateral L5 dorsal ramus block  . EPIDURAL BLOCK INJECTION N/A 07/04/2017   Suella Broad, MD  . FECAL TRANSPLANT  04/21/2015   Procedure: FECAL TRANSPLANT;  Surgeon: Carol Ada, MD;  Location: [place];  Service: Endoscopy;;  . HIP ARTHROPLASTY Left   . HIP ARTHROSCOPY Left 03/06/2018   Left hip arthroplasty, redo for loose hip arthroplasty. Procedure at [facility] hospital  . INJECTION HIP  INTRA ARTICULAR Left 11/2015   for OA by Dr Suella Broad  . OTHER SURGICAL HISTORY Left 2016   Left L3/L4 medial nerve block and Left L5 Dorsal Ramus block Dr Mickel Duhamel  . TOTAL HIP ARTHROPLASTY Left 07/25/2016   Procedure: LEFT TOTAL HIP ARTHROPLASTY ANTERIOR APPROACH;  Surgeon: Paralee Cancel, MD;  Location: [facility];  Service: Orthopedics;  Laterality: Left;    There were no vitals filed for this visit.  Subjective Assessment - 04/24/19 1336    Subjective  I don't know that I progressed in the last week. Had my teeth cleaned yesterday and woke up with a migraine, was feeling ok before the dentist. I have had a couple of nights I could not lay on Left side.    Patient Stated Goals  decrease pain, lift grandson, more activities with kids, be more active, kayak    Currently in Pain?  Yes    Pain Score  3    6 this AM before taking medications   Pain Location  Neck    Pain Orientation  Left    Pain Descriptors / Indicators  Sore         [facility] PT Assessment -  04/24/19 0001      Strength   Overall Strength Comments  gross 4+/5 without shoulder elevation                   [facility] Adult PT Treatment/Exercise - 04/24/19 0001      Neck Exercises: Standing   Other Standing Exercises  wall walk+push ups with yellow band at wrists      Manual Therapy   Manual Therapy  Passive ROM    Manual therapy comments  skilled palpation and monitoring during TPDN    Soft tissue mobilization  bilateral upper trap, suboccipital release    Passive ROM  cervical motion    Manual Traction  cervical       Trigger Point Dry Needling - 04/24/19 0001    Upper Trapezius Response  Twitch reponse elicited;Palpable increased muscle length   Right          PT Education - 04/24/19 1413    Education Details  combining wall walks & pushups, exercise priority, strength, progress & POC    Person(s) Educated  Patient    Methods  Explanation;Demonstration;Tactile cues;Verbal cues    Comprehension  Verbalized understanding;Returned demonstration;Verbal cues required;Need further instruction;Tactile cues required       PT Short Term Goals - 04/01/19 1456      PT SHORT TERM GOAL #1   Title  cervical rotation within 5 deg Rt to Lt    Baseline  see flowsheet    Time  4    Period  Weeks    Status  New    Target Date  05/02/19      PT SHORT TERM GOAL #2   Title  pt will be able to present a proper resting posture without cues    Baseline  tactile and verbal cues required.    Time  4    Period  Weeks    Status  New    Target Date  05/02/19        PT Long Term Goals - 04/01/19 1457      PT LONG TERM GOAL #1   Title  Pt will be able to play with grand children without increase in pain the next day    Baseline  reports a few days of severe pain  following    Time  8    Period  Weeks    Status  New    Target Date  05/30/19      PT LONG TERM GOAL #2   Title  Pt will be able to walk at least 1 mile without increase in pain    Baseline  pain at .5  miles at eval and would like to return to walking for exercise    Time  8    Period  Weeks    Status  New    Target Date  05/30/19      PT LONG TERM GOAL #3   Title  pt will be able to participate in outdoor activities    Baseline  avoids at this time due to pain and tightness that she feels after    Time  8    Period  Weeks    Status  New    Target Date  05/30/19      PT LONG TERM GOAL #4   Title  FOTO to 40% limited    Baseline  46% limited at eval    Time  8    Period  Weeks    Status  New    Target Date  05/30/19      PT LONG TERM GOAL #5   Title  .            Plan - 04/24/19 1415    Clinical Impression Statement  Pt was doing very well until she went to the dentist and then had a migraine the next morning with tightness on left side. Trigger point released with DN on Right which decreased tightness on Left side. Dentist would like for her to wear the double night guard for another month and then decrease to top one only. Will see her again in one week to continue testing longer periods between treatment.    PT Treatment/Interventions  ADLs/Self Care Home Management;Cryotherapy;Electrical Stimulation;Ultrasound;Moist Heat;Traction;Iontophoresis 4mg /ml Dexamethasone;Functional mobility training;Therapeutic activities;Therapeutic exercise;Neuromuscular re-education;Manual techniques;Patient/family education;Passive range of motion;Dry needling;Taping    PT Next Visit Plan  lifting/carrying, car ride ok?    PT Home Exercise Plan  scapular retraction-resting posture, avoid crossing legs, triceps extension, D2 flx band, wall push ups, prone GHJ flx, prone UE/LE lift, thoracic extension, seated W, row, ER, resisted wall step/return    Consulted and Agree with Plan of Care  Patient       Patient will benefit from skilled therapeutic intervention in order to improve the following deficits and impairments:  Decreased range of motion, Difficulty walking, Impaired UE functional use,  Increased muscle spasms, Decreased activity tolerance, Pain, Improper body mechanics, Impaired flexibility, Decreased strength, Postural dysfunction  Visit Diagnosis: 1. Cervicalgia   2. Other headache syndrome   3. Abnormality of gait        Problem List Patient Active Problem List   Diagnosis Date Noted  . Cough in adult 03/20/2019  . 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 Pain in joint, multiple sites 11/18/2018  . DDD (degenerative disc disease), cervical 11/18/2018  . DDD (degenerative disc disease), lumbar 11/18/2018  . Positive ANA (antinuclear antibody) 11/18/2018  . Sicca syndrome ([place]) 11/18/2018  . History of colonic polyps 09/30/2018  . Chronic contact dermatitis 08/06/2018  . Chronic migraine without aura, with intractable migraine, so stated, with status migrainosus 11/28/2017  . URI (upper respiratory infection) 08/14/2017  . Fatigue 07/05/2017  . Insulin resistance 07/04/2017  . Vitamin D  deficiency 05/08/2017  . Class 1 obesity without serious comorbidity with body mass index (BMI) of 30.0 to 30.9 in adult 04/24/2017  . Vasomotor symptoms due to menopause 04/19/2017  . Periodic limb movement sleep disorder 03/28/2017  . Nocturnal hypoxemia 03/06/2017  . Morbid obesity ([place]) 03/06/2017  . Snoring 03/06/2017  . Gasping for breath 03/06/2017  . Sleep walking and eating 03/06/2017  . Episodic cluster headache, not intractable 03/06/2017  . Obesity (BMI 30.0-34.9) 01/29/2017  . Other insomnia 11/09/2016  . S/P left THA, AA 07/25/2016  . Irritable bowel syndrome with diarrhea 04/03/2016  . Chronic migraine 02/25/2016  . Left ventricular hypertrophy, mild 02/25/2016  . History of irritable bowel syndrome 02/15/2016  . 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 Osteoarthritis of left hip 11/01/2015  . Hearing loss of both ears 07/27/2015  . Post concussive syndrome 07/14/2015  . Depression (NOS) 07/02/2015  .  Allergic rhinoconjunctivitis 07/02/2015  . Fibromyalgia syndrome 07/01/2015  . History of Clostridium difficile colitis, persistent 07/01/2015  . Impairment of balance 02/10/2015  . Essential hypertension 05/15/2013  . Hypothyroidism 05/15/2013  . Hyperlipidemia 05/15/2013  . Interstitial cystitis 05/15/2013  . Chronic migraine without aura 05/15/2013  . Spondylosis of lumbar region without myelopathy or radiculopathy 10/30/2011    Howard Bunte C. Airon Sahni PT, DPT 04/24/19 2:18 PM   [facility] [address] [place], [place], [address] Phone: [phone]   Fax:  [phone]  Name: Molly Giles MRN: [id number] Date of Birth: March 12, 1956

## 2019-04-27 ENCOUNTER — Other Ambulatory Visit: Payer: Self-pay | Admitting: Rheumatology

## 2019-04-27 DIAGNOSIS — M797 Fibromyalgia: Secondary | ICD-10-CM

## 2019-04-28 ENCOUNTER — Other Ambulatory Visit: Payer: Self-pay | Admitting: Family Medicine

## 2019-04-28 DIAGNOSIS — E038 Other specified hypothyroidism: Secondary | ICD-10-CM

## 2019-04-28 NOTE — Progress Notes (Signed)
Molly Giles must have her serum TSH level measured before further refills of her LT4 is possible.

## 2019-04-28 NOTE — Telephone Encounter (Signed)
Dr. Estanislado Pandy sent last refill.

## 2019-04-28 NOTE — Telephone Encounter (Signed)
Last Visit: 02/04/19 Next Visit: 08/04/19  Okay to refill Ambien?

## 2019-04-28 NOTE — Telephone Encounter (Signed)
My Impriv[employer] is still not working.

## 2019-04-29 ENCOUNTER — Ambulatory Visit: Payer: BC Managed Care – PPO | Admitting: Physical Therapy

## 2019-05-01 ENCOUNTER — Other Ambulatory Visit: Payer: Self-pay

## 2019-05-01 ENCOUNTER — Encounter: Payer: Self-pay | Admitting: Physical Therapy

## 2019-05-01 ENCOUNTER — Ambulatory Visit: Payer: BC Managed Care – PPO | Admitting: Physical Therapy

## 2019-05-01 DIAGNOSIS — M542 Cervicalgia: Secondary | ICD-10-CM | POA: Diagnosis not present

## 2019-05-01 DIAGNOSIS — G4489 Other headache syndrome: Secondary | ICD-10-CM

## 2019-05-01 NOTE — Therapy (Signed)
[facility] [place], [place], [address] Phone: [phone]   Fax:  [phone]  Physical Therapy Treatment  Patient Details  Name: Taylar Hartsough MRN: [id number] Date of Birth: [birth year] Referring Provider (PT): Debbora Presto, NP   Encounter Date: 05/01/2019  PT End of Session - 05/01/19 1418    Visit Number  7    Number of Visits  17    Date for PT Re-Evaluation  05/30/19    Authorization Type  [employer]    PT Start Time  1330    PT Stop Time  1415    PT Time Calculation (min)  45 min    Activity Tolerance  Patient tolerated treatment well    Behavior During Therapy  [facility] for tasks assessed/performed       Past Medical History:  Diagnosis Date  . Allergic rhinoconjunctivitis 07/02/2015  . Arthritis   . Benign positional vertigo 04/2015   Responded well to [facility]  . Bruxism (teeth grinding)   . Chronic migraine 02/25/2016   Per Dr Jaynee Eagles review in notes from [facility] from September 2015. Showed total headache days last month 18. Severe headache days 7 days. Moderate headache days 5 days. Mild headache days last month sick days. Days without headache last month 10 days. Symptoms associated with photophobia, phonophobia, osmophobia, neck pain, dizziness, jaw pain, nasal congestion, vision disturbances, tingling and numbness, weakness and worsening with activity. Each headache attack last 3 hours depending on treatment in severity. Left side, the right side, easier side, the frontal area in the back of the head. Characterized as throbbing, pressure, tightness, squeezing, stabbing and burning   . Chronic migraine w/o aura w/o status migrainosus, not intractable 07/14/2015  . DDD (degenerative disc disease), cervical 11/18/2018  . DDD (degenerative disc disease), lumbar   . Degenerative cervical disc   . Depression   . Disc displacement, lumbar   . Dysrhythmia    seen by dr Wynonia Lawman- not a problem  since she has been on Bystolic  . Essential hypertension 05/15/2013  . Family history of adverse reaction to anesthesia    Brother- N/V  . Family history of premature CAD 05/15/2013  . Fibromyalgia   . GERD (gastroesophageal reflux disease) 12/16/2014  . H/O seasonal allergies   . Hammer toe    Left great toe  . Hearing loss of both ears 07/27/2015   mild to borderline moderate low frequency hearing loss improving to within normal limits bilaterally on audiology testing at [facility] in November 2016.    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 History of colonic polyps   . History of irritable bowel syndrome 02/15/2016  . History of migraine headaches 05/15/2013   Per Dr Jaynee Eagles review in notes from [facility] from September 2015. Showed total headache days last month 18. Severe headache days 7 days. Moderate headache days 5 days. Mild headache days last month sick days. Days without headache last month 10 days. Symptoms associated with photophobia, phonophobia, osmophobia, neck pain, dizziness, jaw pain, nasal congestion, vision disturbances, tingling and numbness, weakness and worsening with activity. Each headache attack last 3 hours depending on treatment in severity. Left side, the right side, easier side, the frontal area in the back of the head. Characterized as throbbing, pressure, tightness, squeezing, stabbing and burning    . Hx of bad fall 02/2015   Severe Facial/head trauma without fracture  . Hyperlipidemia  1998  . Hypertension 2004  . Hypothyroidism   . Impairment of balance 02/2015   Consequent of postconcussive syndrome  . Injury of triangular fibrocartilage complex of left wrist 02/25/2019   Dx 02/25/19 Iran Planas IV MD [facility])  . Interstitial cystitis   . 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[place]) 03/10/2018   [facility]  . Lumbar facet joint pain   . 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  Migraine 1962   Chronic Migraines  . Mood disorder ([place])   . Musculoskeletal neck pain 07/14/2015  . Normal coronary arteries 05/14/2014  . Osteoarthritis of left hip 11/01/2015   MRI order by Dr Alvan Dame (ortho) 10/2015 showed significant arthritis of left hip joint with cystic changes in femoral head c/w osteoarthritis  . 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 Overweight   . Pain in joint of left shoulder 11/09/2016  . Palpitations 05/15/2013   [facility] manages  . Periodic limb movement sleep disorder 03/28/2017  . Perirectal cyst 05/07/2016  . PONV (postoperative nausea and vomiting)   . Post concussion syndrome 06/06/2015  . Post concussive syndrome 07/14/2015   Ms Washko's post-concussive syndrome manifesting in vertigo and headache, mood changes, poor balance, dizziness, and decreased concentration per Dr Jaynee Eagles at [facility].   Marland Kitchen Posterior vitreous detachment of right eye 2014  . Shingles   . Shortness of breath dyspnea    with exertion  . Thyroid nodule 08/11/2009   Findings: The thyroid gland is within normal limits in size.  The gland is diffusely inhomogeneous. A small solid nodule is noted in the lower pole  medially on the right of 7 x 6 x 8 mm. A small solid nodule is noted inferiorly on the left of 3 x 3 x 4 mm.  IMPRESSION:  The thyroid gland is within normal limits in size with only small solid nodules present, the largest of only 8 mm in diameter on the right.    . 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   . Vasomotor symptoms due to menopause 04/19/2017    Past Surgical History:  Procedure Laterality Date  . Bladder dilitation     x 3  . BREAST BIOPSY Right 2011   Benign histology  . CARDIOVASCULAR STRESS TEST  2000   Unremarkable per pt report  . CARPOMETACARPAL JOINT ARTHROTOMY Right 2011  . COLONOSCOPY    . COLONOSCOPY WITH PROPOFOL N/A 04/21/2015    Procedure: COLONOSCOPY WITH PROPOFOL;  Surgeon: Carol Ada, MD;  Location: [facility];  Service: Endoscopy;  Laterality: N/A;  . EPIDURAL BLOCK INJECTION Left 04/12/2016   Left Medial Nerve Block and Left L5 ramus block, Dr Suella Broad   . EPIDURAL BLOCK INJECTION  03/21/2016   Left L3-4 medial branch block and Left L5 & dorsal ramus block   . EPIDURAL BLOCK INJECTION N/A 10/25/2016   Suella Broad, MD. Lumbar medial branch block  . EPIDURAL BLOCK INJECTION N/A 02/09/2017   Suella Broad, MD.  Bilateral L3/4 medial branch block, bilateral L5 dorsal ramus block  . EPIDURAL BLOCK INJECTION N/A 07/04/2017   Suella Broad, MD  . FECAL TRANSPLANT  04/21/2015   Procedure: FECAL TRANSPLANT;  Surgeon: Carol Ada, MD;  Location: [place];  Service: Endoscopy;;  . HIP ARTHROPLASTY Left   . HIP ARTHROSCOPY Left 03/06/2018   Left hip arthroplasty, redo for loose hip arthroplasty. Procedure at [facility]  . INJECTION HIP  INTRA ARTICULAR Left 11/2015   for OA by Dr Suella Broad  . OTHER SURGICAL HISTORY Left 2016   Left L3/L4 medial nerve block and Left L5 Dorsal Ramus block Dr Mickel Duhamel  . TOTAL HIP ARTHROPLASTY Left 07/25/2016   Procedure: LEFT TOTAL HIP ARTHROPLASTY ANTERIOR APPROACH;  Surgeon: Paralee Cancel, MD;  Location: [facility];  Service: Orthopedics;  Laterality: Left;    There were no vitals filed for this visit.  Subjective Assessment - 05/01/19 1330    Subjective  Not feeling so great today. I didn;t get a lot of sleep last Friday so I had a HA on Saturday. the migraines are not as intense as they used to be and I am off the tramadol. I have been wearing my night guard.    Patient Stated Goals  decrease pain, lift grandson, more activities with kids, be more active, kayak    Currently in Pain?  Yes    Pain Score  5     Pain Location  Shoulder    Pain Orientation  Left    Pain Descriptors / Indicators  Tightness    Aggravating Factors   Lt cervical rotation    Pain  Relieving Factors  hot shower                       [facility] Adult PT Treatment/Exercise - 05/01/19 0001      Neck Exercises: Standing   UE Flexion with Stabilization Limitations  flexion & abd 2 lb to 90 & full range    Upper Extremity D2  Flexion    UE D2 Weights (lbs)  2    Other Standing Exercises  wall plank with ball on wall circles    Other Standing Exercises  biceps curl in mini wall sit      Neck Exercises: Seated   Cervical Rotation Limitations  following DN- slow due to dizziness      Manual Therapy   Manual therapy comments  skilled palpation and monitoring during TPDN    Soft tissue mobilization  bilateral upper traps, criss friction to Lt levator      Neck Exercises: Stretches   Other Neck Stretches  with sheet- upper trap & first rib mob       Trigger Point Dry Needling - 05/01/19 0001    Upper Trapezius Response  Twitch reponse elicited;Palpable increased muscle length   Left            PT Short Term Goals - 04/01/19 1456      PT SHORT TERM GOAL #1   Title  cervical rotation within 5 deg Rt to Lt    Baseline  see flowsheet    Time  4    Period  Weeks    Status  New    Target Date  05/02/19      PT SHORT TERM GOAL #2   Title  pt will be able to present a proper resting posture without cues    Baseline  tactile and verbal cues required.    Time  4    Period  Weeks    Status  New    Target Date  05/02/19        PT Long Term Goals - 04/01/19 1457      PT LONG TERM GOAL #1   Title  Pt will be able to play with grand children without increase in pain the next day    Baseline  reports a few  days of severe pain following    Time  8    Period  Weeks    Status  New    Target Date  05/30/19      PT LONG TERM GOAL #2   Title  Pt will be able to walk at least 1 mile without increase in pain    Baseline  pain at .5 miles at eval and would like to return to walking for exercise    Time  8    Period  Weeks    Status  New    Target  Date  05/30/19      PT LONG TERM GOAL #3   Title  pt will be able to participate in outdoor activities    Baseline  avoids at this time due to pain and tightness that she feels after    Time  8    Period  Weeks    Status  New    Target Date  05/30/19      PT LONG TERM GOAL #4   Title  FOTO to 40% limited    Baseline  46% limited at eval    Time  8    Period  Weeks    Status  New    Target Date  05/30/19      PT LONG TERM GOAL #5   Title  .            Plan - 05/01/19 1421    Clinical Impression Statement  Tightness noted in upper trap and levator on Left side today decreased with manual therapy. Less overall tightness with trigger points noted in deep bands of upper traps. Reported soreness followng DN as expected. Added progressions to HEP as options for strengthening.    PT Treatment/Interventions  ADLs/Self Care Home Management;Cryotherapy;Electrical Stimulation;Ultrasound;Moist Heat;Traction;Iontophoresis 4mg /ml Dexamethasone;Functional mobility training;Therapeutic activities;Therapeutic exercise;Neuromuscular re-education;Manual techniques;Patient/family education;Passive range of motion;Dry needling;Taping    PT Home Exercise Plan  scapular retraction-resting posture, avoid crossing legs, triceps extension/biceps curls, D2 flx band/weights, wall push ups, prone GHJ flx, prone UE/LE lift, thoracic extension, seated W, row, ER, resisted wall step/return, ball-wall circle in wall plank    Consulted and Agree with Plan of Care  Patient       Patient will benefit from skilled therapeutic intervention in order to improve the following deficits and impairments:  Decreased range of motion, Difficulty walking, Impaired UE functional use, Increased muscle spasms, Decreased activity tolerance, Pain, Improper body mechanics, Impaired flexibility, Decreased strength, Postural dysfunction  Visit Diagnosis: Cervicalgia  Other headache syndrome     Problem List Patient Active  Problem List   Diagnosis Date Noted  . Cough in adult 03/20/2019  . 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 Pain in joint, multiple sites 11/18/2018  . DDD (degenerative disc disease), cervical 11/18/2018  . DDD (degenerative disc disease), lumbar 11/18/2018  . Positive ANA (antinuclear antibody) 11/18/2018  . Sicca syndrome ([place]) 11/18/2018  . History of colonic polyps 09/30/2018  . Chronic contact dermatitis 08/06/2018  . Chronic migraine without aura, with intractable migraine, so stated, with status migrainosus 11/28/2017  . URI (upper respiratory infection) 08/14/2017  . Fatigue 07/05/2017  . Insulin resistance 07/04/2017  . Vitamin D deficiency 05/08/2017  . Class 1 obesity without serious comorbidity with body mass index (BMI) of 30.0 to 30.9 in adult 04/24/2017  . Vasomotor symptoms due to menopause 04/19/2017  . Periodic limb movement sleep disorder 03/28/2017  . Nocturnal hypoxemia 03/06/2017  .  Morbid obesity ([place]) 03/06/2017  . Snoring 03/06/2017  . Gasping for breath 03/06/2017  . Sleep walking and eating 03/06/2017  . Episodic cluster headache, not intractable 03/06/2017  . Obesity (BMI 30.0-34.9) 01/29/2017  . Other insomnia 11/09/2016  . S/P left THA, AA 07/25/2016  . Irritable bowel syndrome with diarrhea 04/03/2016  . Chronic migraine 02/25/2016  . Left ventricular hypertrophy, mild 02/25/2016  . History of irritable bowel syndrome 02/15/2016  . 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 Osteoarthritis of left hip 11/01/2015  . Hearing loss of both ears 07/27/2015  . Post concussive syndrome 07/14/2015  . Depression (NOS) 07/02/2015  . Allergic rhinoconjunctivitis 07/02/2015  . Fibromyalgia syndrome 07/01/2015  . History of Clostridium difficile colitis, persistent 07/01/2015  . Impairment of balance 02/10/2015  . Essential hypertension 05/15/2013  . Hypothyroidism 05/15/2013  . Hyperlipidemia 05/15/2013  .  Interstitial cystitis 05/15/2013  . Chronic migraine without aura 05/15/2013  . Spondylosis of lumbar region without myelopathy or radiculopathy 10/30/2011   Boni Maclellan C. Isidora Laham PT, DPT 05/01/19 2:24 PM   [facility] [address] [place], [place], [address] Phone: [phone]   Fax:  [phone]  Name: Michie Molnar MRN: [id number] Date of Birth: 1955-12-26

## 2019-05-06 ENCOUNTER — Ambulatory Visit: Payer: BC Managed Care – PPO | Admitting: Physical Therapy

## 2019-05-08 ENCOUNTER — Ambulatory Visit: Payer: BC Managed Care – PPO | Admitting: Physical Therapy

## 2019-05-13 ENCOUNTER — Ambulatory Visit: Payer: BC Managed Care – PPO | Attending: Family Medicine | Admitting: Physical Therapy

## 2019-05-13 ENCOUNTER — Other Ambulatory Visit: Payer: Self-pay

## 2019-05-13 ENCOUNTER — Encounter: Payer: Self-pay | Admitting: Physical Therapy

## 2019-05-13 DIAGNOSIS — R42 Dizziness and giddiness: Secondary | ICD-10-CM | POA: Insufficient documentation

## 2019-05-13 DIAGNOSIS — G4489 Other headache syndrome: Secondary | ICD-10-CM | POA: Insufficient documentation

## 2019-05-13 DIAGNOSIS — R269 Unspecified abnormalities of gait and mobility: Secondary | ICD-10-CM

## 2019-05-13 DIAGNOSIS — H8112 Benign paroxysmal vertigo, left ear: Secondary | ICD-10-CM | POA: Diagnosis present

## 2019-05-13 DIAGNOSIS — M542 Cervicalgia: Secondary | ICD-10-CM | POA: Diagnosis not present

## 2019-05-13 NOTE — Therapy (Signed)
[place], [place], [address] Phone: [phone]   Fax:  [phone]  Physical Therapy Treatment  Patient Details  Name: Molly Giles MRN: [id number] Date of Birth: Mar 01, 1956 Referring Provider (PT): Debbora Presto, NP   Encounter Date: 05/13/2019  PT End of Session - 05/13/19 1332    Visit Number  8    Number of Visits  17    Date for PT Re-Evaluation  05/30/19    Authorization Type  [employer]    PT Start Time  1318    PT Stop Time  1400    PT Time Calculation (min)  42 min       Past Medical History:  Diagnosis Date  . Allergic rhinoconjunctivitis 07/02/2015  . Arthritis   . Benign positional vertigo 04/2015   Responded well to [facility]  . Bruxism (teeth grinding)   . Chronic migraine 02/25/2016   Per Dr Jaynee Eagles review in notes from [facility] from September 2015. Showed total headache days last month 18. Severe headache days 7 days. Moderate headache days 5 days. Mild headache days last month sick days. Days without headache last month 10 days. Symptoms associated with photophobia, phonophobia, osmophobia, neck pain, dizziness, jaw pain, nasal congestion, vision disturbances, tingling and numbness, weakness and worsening with activity. Each headache attack last 3 hours depending on treatment in severity. Left side, the right side, easier side, the frontal area in the back of the head. Characterized as throbbing, pressure, tightness, squeezing, stabbing and burning   . Chronic migraine w/o aura w/o status migrainosus, not intractable 07/14/2015  . DDD (degenerative disc disease), cervical 11/18/2018  . DDD (degenerative disc disease), lumbar   . Degenerative cervical disc   . Depression   . Disc displacement, lumbar   . Dysrhythmia    seen by dr Wynonia Lawman- not a problem since she has been on Bystolic  . Essential hypertension 05/15/2013  . Family history of adverse reaction to anesthesia     Brother- N/V  . Family history of premature CAD 05/15/2013  . Fibromyalgia   . GERD (gastroesophageal reflux disease) 12/16/2014  . H/O seasonal allergies   . Hammer toe    Left great toe  . Hearing loss of both ears 07/27/2015   mild to borderline moderate low frequency hearing loss improving to within normal limits bilaterally on audiology testing at [facility] in November 2016.    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 History of colonic polyps   . History of irritable bowel syndrome 02/15/2016  . History of migraine headaches 05/15/2013   Per Dr Jaynee Eagles review in notes from [facility] from September 2015. Showed total headache days last month 18. Severe headache days 7 days. Moderate headache days 5 days. Mild headache days last month sick days. Days without headache last month 10 days. Symptoms associated with photophobia, phonophobia, osmophobia, neck pain, dizziness, jaw pain, nasal congestion, vision disturbances, tingling and numbness, weakness and worsening with activity. Each headache attack last 3 hours depending on treatment in severity. Left side, the right side, easier side, the frontal area in the back of the head. Characterized as throbbing, pressure, tightness, squeezing, stabbing and burning    . Hx of bad fall 02/2015   Severe Facial/head trauma without fracture  . Hyperlipidemia 1998  . Hypertension 2004  . Hypothyroidism   . Impairment of balance 02/2015   Consequent of postconcussive syndrome  .  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 Interstitial cystitis   . 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[place]) 03/10/2018   [facility]  . Lumbar facet joint pain   . 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 Migraine 1962   Chronic Migraines  . Mood disorder ([place])   . Musculoskeletal neck pain 07/14/2015  . Normal coronary  arteries 05/14/2014  . Osteoarthritis of left hip 11/01/2015   MRI order by Dr Alvan Dame (ortho) 10/2015 showed significant arthritis of left hip joint with cystic changes in femoral head c/w osteoarthritis  . 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 Overweight   . Pain in joint of left shoulder 11/09/2016  . Palpitations 05/15/2013   [facility] manages  . Periodic limb movement sleep disorder 03/28/2017  . Perirectal cyst 05/07/2016  . PONV (postoperative nausea and vomiting)   . Post concussion syndrome 06/06/2015  . Post concussive syndrome 07/14/2015   Ms Vandezande's post-concussive syndrome manifesting in vertigo and headache, mood changes, poor balance, dizziness, and decreased concentration per Dr Jaynee Eagles at [facility].   Marland Kitchen Posterior vitreous detachment of right eye 2014  . Shingles   . Shortness of breath dyspnea    with exertion  . Thyroid nodule 08/11/2009   Findings: The thyroid gland is within normal limits in size.  The gland is diffusely inhomogeneous. A small solid nodule is noted in the lower pole  medially on the right of 7 x 6 x 8 mm. A small solid nodule is noted inferiorly on the left of 3 x 3 x 4 mm.  IMPRESSION:  The thyroid gland is within normal limits in size with only small solid nodules present, the largest of only 8 mm in diameter on the right.    . 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   . Vasomotor symptoms due to menopause 04/19/2017    Past Surgical History:  Procedure Laterality Date  . Bladder dilitation     x 3  . BREAST BIOPSY Right 2011   Benign histology  . CARDIOVASCULAR STRESS TEST  2000   Unremarkable per pt report  . CARPOMETACARPAL JOINT ARTHROTOMY Right 2011  . COLONOSCOPY    . COLONOSCOPY WITH PROPOFOL N/A 04/21/2015   Procedure: COLONOSCOPY WITH PROPOFOL;  Surgeon: Carol Ada, MD;  Location: [facility];  Service: Endoscopy;   Laterality: N/A;  . EPIDURAL BLOCK INJECTION Left 04/12/2016   Left Medial Nerve Block and Left L5 ramus block, Dr Suella Broad   . EPIDURAL BLOCK INJECTION  03/21/2016   Left L3-4 medial branch block and Left L5 & dorsal ramus block   . EPIDURAL BLOCK INJECTION N/A 10/25/2016   Suella Broad, MD. Lumbar medial branch block  . EPIDURAL BLOCK INJECTION N/A 02/09/2017   Suella Broad, MD.  Bilateral L3/4 medial branch block, bilateral L5 dorsal ramus block  . EPIDURAL BLOCK INJECTION N/A 07/04/2017   Suella Broad, MD  . FECAL TRANSPLANT  04/21/2015   Procedure: FECAL TRANSPLANT;  Surgeon: Carol Ada, MD;  Location: [facility];  Service: Endoscopy;;  . HIP ARTHROPLASTY Left   . HIP ARTHROSCOPY Left 03/06/2018   Left hip arthroplasty, redo for loose hip arthroplasty. Procedure at [facility] hospital  . INJECTION HIP INTRA ARTICULAR Left 11/2015   for OA by Dr Suella Broad  . OTHER SURGICAL HISTORY Left 2016   Left L3/L4  medial nerve block and Left L5 Dorsal Ramus block Dr Mickel Duhamel  . TOTAL HIP ARTHROPLASTY Left 07/25/2016   Procedure: LEFT TOTAL HIP ARTHROPLASTY ANTERIOR APPROACH;  Surgeon: Paralee Cancel, MD;  Location: [facility];  Service: Orthopedics;  Laterality: Left;    There were no vitals filed for this visit.  Subjective Assessment - 05/13/19 1321    Subjective  5/10 both shoulders. Had increased pain in shoulders after using sheet to do first rib mob.    Currently in Pain?  Yes    Pain Score  3     Pain Location  Shoulder    Pain Orientation  Left;Right         [facility] PT Assessment - 05/13/19 0001      Observation/Other Assessments   Focus on Therapeutic Outcomes (FOTO)   44% limited       AROM   Cervical - Right Rotation  62    Cervical - Left Rotation  65                   [facility] Adult PT Treatment/Exercise - 05/13/19 0001      Neck Exercises: Theraband   Shoulder Extension  20 reps    Shoulder Extension Limitations  red    Shoulder External  Rotation  20 reps    Shoulder External Rotation Limitations  red       Neck Exercises: Standing   Wall Push Ups Limitations  wall walks yellow tband    UE Flexion with Stabilization Limitations  flexion & abd 2 lb to 90 & full range   x 20 flex, x 20 abdct   Upper Extremity D2  Flexion;10 reps    UE D2 Weights (lbs)  2    Other Standing Exercises  wall plank with ball on wall circles    Other Standing Exercises  biceps curl in mini wall sit 2# x 20 each way, then tricep extension yellow band x 20 -cues for form       Neck Exercises: Stretches   Other Neck Stretches  with sheet- upper trap & first rib mob               PT Short Term Goals - 05/13/19 1410      PT SHORT TERM GOAL #1   Title  cervical rotation within 5 deg Rt to Lt    Baseline  see flowsheet    Period  Weeks    Status  Achieved      PT SHORT TERM GOAL #2   Title  pt will be able to present a proper resting posture without cues    Time  4    Period  Weeks    Status  Achieved        PT Long Term Goals - 04/01/19 1457      PT LONG TERM GOAL #1   Title  Pt will be able to play with grand children without increase in pain the next day    Baseline  reports a few days of severe pain following    Time  8    Period  Weeks    Status  New    Target Date  05/30/19      PT LONG TERM GOAL #2   Title  Pt will be able to walk at least 1 mile without increase in pain    Baseline  pain at .5 miles at eval and would like to return to walking for exercise  Time  8    Period  Weeks    Status  New    Target Date  05/30/19      PT LONG TERM GOAL #3   Title  pt will be able to participate in outdoor activities    Baseline  avoids at this time due to pain and tightness that she feels after    Time  8    Period  Weeks    Status  New    Target Date  05/30/19      PT LONG TERM GOAL #4   Title  FOTO to 40% limited    Baseline  46% limited at eval    Time  8    Period  Weeks    Status  New    Target Date   05/30/19      PT LONG TERM GOAL #5   Title  .            Plan - 05/13/19 1407    Clinical Impression Statement  Pt reports bilateral upper trap tightness today with pain at 3/10. Session focused on reviewing HEP. Several exercises needed corrections to decrease shoulder/ uppertrap activation and isolate appropriate muscles. She reports walking is limited by hip pain and not neck pain. She hsa not see grandchildren lately to asses ability to lift and play with them. FOTO core improved from 44% limited to 42% limited. Cervical rotation improved and within 5 degrees Rt verses Lt. She can demonstrate proper sitting posture without cues. STG#1, #2 met.    Examination-Activity Limitations  Locomotion Level;Lift;Dressing;Stand;Squat;Carry;Sleep;Bend;Reach Overhead;Bed Mobility    PT Next Visit Plan  lifting/carrying, car ride ok?    PT Home Exercise Plan  scapular retraction-resting posture, avoid crossing legs, triceps extension/biceps curls, D2 flx band/weights, wall push ups, prone GHJ flx, prone UE/LE lift, thoracic extension, seated W, row, ER, resisted wall step/return, ball-wall circle in wall plank       Patient will benefit from skilled therapeutic intervention in order to improve the following deficits and impairments:     Visit Diagnosis: Cervicalgia  Other headache syndrome  Abnormality of gait     Problem List Patient Active Problem List   Diagnosis Date Noted  . Cough in adult 03/20/2019  . 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 Pain in joint, multiple sites 11/18/2018  . DDD (degenerative disc disease), cervical 11/18/2018  . DDD (degenerative disc disease), lumbar 11/18/2018  . Positive ANA (antinuclear antibody) 11/18/2018  . Sicca syndrome ([place]) 11/18/2018  . History of colonic polyps 09/30/2018  . Chronic contact dermatitis 08/06/2018  . Chronic migraine without aura, with intractable migraine, so stated, with status migrainosus  11/28/2017  . URI (upper respiratory infection) 08/14/2017  . Fatigue 07/05/2017  . Insulin resistance 07/04/2017  . Vitamin D deficiency 05/08/2017  . Class 1 obesity without serious comorbidity with body mass index (BMI) of 30.0 to 30.9 in adult 04/24/2017  . Vasomotor symptoms due to menopause 04/19/2017  . Periodic limb movement sleep disorder 03/28/2017  . Nocturnal hypoxemia 03/06/2017  . Morbid obesity ([place]) 03/06/2017  . Snoring 03/06/2017  . Gasping for breath 03/06/2017  . Sleep walking and eating 03/06/2017  . Episodic cluster headache, not intractable 03/06/2017  . Obesity (BMI 30.0-34.9) 01/29/2017  . Other insomnia 11/09/2016  . S/P left THA, AA 07/25/2016  . Irritable bowel syndrome with diarrhea 04/03/2016  . Chronic migraine 02/25/2016  . Left ventricular hypertrophy, mild 02/25/2016  . History of  irritable bowel syndrome 02/15/2016  . 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 Osteoarthritis of left hip 11/01/2015  . Hearing loss of both ears 07/27/2015  . Post concussive syndrome 07/14/2015  . Depression (NOS) 07/02/2015  . Allergic rhinoconjunctivitis 07/02/2015  . Fibromyalgia syndrome 07/01/2015  . History of Clostridium difficile colitis, persistent 07/01/2015  . Impairment of balance 02/10/2015  . Essential hypertension 05/15/2013  . Hypothyroidism 05/15/2013  . Hyperlipidemia 05/15/2013  . Interstitial cystitis 05/15/2013  . Chronic migraine without aura 05/15/2013  . Spondylosis of lumbar region without myelopathy or radiculopathy 10/30/2011    Dorene Ar, PTA 05/13/2019, 2:13 PM  [place], [place], [address] Phone: [phone]   Fax:  [phone]  Name: Molly Giles MRN: [id number] Date of Birth: 03-09-1956

## 2019-05-14 DIAGNOSIS — G8929 Other chronic pain: Secondary | ICD-10-CM

## 2019-05-14 DIAGNOSIS — M545 Low back pain, unspecified: Secondary | ICD-10-CM | POA: Insufficient documentation

## 2019-05-14 HISTORY — DX: Low back pain, unspecified: M54.50

## 2019-05-14 HISTORY — DX: Other chronic pain: G89.29

## 2019-05-15 ENCOUNTER — Other Ambulatory Visit: Payer: Self-pay

## 2019-05-15 ENCOUNTER — Ambulatory Visit: Payer: BC Managed Care – PPO | Admitting: Family Medicine

## 2019-05-15 ENCOUNTER — Encounter: Payer: Self-pay | Admitting: Physical Therapy

## 2019-05-15 ENCOUNTER — Ambulatory Visit: Payer: BC Managed Care – PPO | Admitting: Physical Therapy

## 2019-05-15 DIAGNOSIS — M542 Cervicalgia: Secondary | ICD-10-CM

## 2019-05-15 DIAGNOSIS — H8112 Benign paroxysmal vertigo, left ear: Secondary | ICD-10-CM

## 2019-05-15 DIAGNOSIS — R42 Dizziness and giddiness: Secondary | ICD-10-CM

## 2019-05-15 DIAGNOSIS — R269 Unspecified abnormalities of gait and mobility: Secondary | ICD-10-CM

## 2019-05-15 NOTE — Therapy (Signed)
[place], [place], [address] Phone: [phone]   Fax:  [phone]  Physical Therapy Treatment  Patient Details  Name: Molly Giles MRN: [id number] Date of Birth: 01/19/1956 Referring Provider (PT): Debbora Presto, NP   Encounter Date: 05/15/2019  PT End of Session - 05/15/19 1320    Visit Number  9    Number of Visits  17    Date for PT Re-Evaluation  05/30/19    Authorization Type  [employer]    PT Start Time  1315    PT Stop Time  1400    PT Time Calculation (min)  45 min       Past Medical History:  Diagnosis Date  . Allergic rhinoconjunctivitis 07/02/2015  . Arthritis   . Benign positional vertigo 04/2015   Responded well to [facility]  . Bruxism (teeth grinding)   . Chronic migraine 02/25/2016   Per Dr Jaynee Eagles review in notes from [facility] from September 2015. Showed total headache days last month 18. Severe headache days 7 days. Moderate headache days 5 days. Mild headache days last month sick days. Days without headache last month 10 days. Symptoms associated with photophobia, phonophobia, osmophobia, neck pain, dizziness, jaw pain, nasal congestion, vision disturbances, tingling and numbness, weakness and worsening with activity. Each headache attack last 3 hours depending on treatment in severity. Left side, the right side, easier side, the frontal area in the back of the head. Characterized as throbbing, pressure, tightness, squeezing, stabbing and burning   . Chronic migraine w/o aura w/o status migrainosus, not intractable 07/14/2015  . DDD (degenerative disc disease), cervical 11/18/2018  . DDD (degenerative disc disease), lumbar   . Degenerative cervical disc   . Depression   . Disc displacement, lumbar   . Dysrhythmia    seen by dr Wynonia Lawman- not a problem since she has been on Bystolic  . Essential hypertension 05/15/2013  . Family history of adverse reaction to anesthesia     Brother- N/V  . Family history of premature CAD 05/15/2013  . Fibromyalgia   . GERD (gastroesophageal reflux disease) 12/16/2014  . H/O seasonal allergies   . Hammer toe    Left great toe  . Hearing loss of both ears 07/27/2015   mild to borderline moderate low frequency hearing loss improving to within normal limits bilaterally on audiology testing at [facility] in November 2016.    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 History of colonic polyps   . History of irritable bowel syndrome 02/15/2016  . History of migraine headaches 05/15/2013   Per Dr Jaynee Eagles review in notes from [facility] from September 2015. Showed total headache days last month 18. Severe headache days 7 days. Moderate headache days 5 days. Mild headache days last month sick days. Days without headache last month 10 days. Symptoms associated with photophobia, phonophobia, osmophobia, neck pain, dizziness, jaw pain, nasal congestion, vision disturbances, tingling and numbness, weakness and worsening with activity. Each headache attack last 3 hours depending on treatment in severity. Left side, the right side, easier side, the frontal area in the back of the head. Characterized as throbbing, pressure, tightness, squeezing, stabbing and burning    . Hx of bad fall 02/2015   Severe Facial/head trauma without fracture  . Hyperlipidemia 1998  . Hypertension 2004  . Hypothyroidism   . Impairment of balance 02/2015   Consequent of postconcussive syndrome  .  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 Interstitial cystitis   . 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[place]) 03/10/2018   [facility]  . Lumbar facet joint pain   . 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 Migraine 1962   Chronic Migraines  . Mood disorder ([place])   . Musculoskeletal neck pain 07/14/2015  . Normal coronary  arteries 05/14/2014  . Osteoarthritis of left hip 11/01/2015   MRI order by Dr Alvan Dame (ortho) 10/2015 showed significant arthritis of left hip joint with cystic changes in femoral head c/w osteoarthritis  . 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 Overweight   . Pain in joint of left shoulder 11/09/2016  . Palpitations 05/15/2013   [facility] manages  . Periodic limb movement sleep disorder 03/28/2017  . Perirectal cyst 05/07/2016  . PONV (postoperative nausea and vomiting)   . Post concussion syndrome 06/06/2015  . Post concussive syndrome 07/14/2015   Ms Vandezande's post-concussive syndrome manifesting in vertigo and headache, mood changes, poor balance, dizziness, and decreased concentration per Dr Jaynee Eagles at [facility].   Marland Kitchen Posterior vitreous detachment of right eye 2014  . Shingles   . Shortness of breath dyspnea    with exertion  . Thyroid nodule 08/11/2009   Findings: The thyroid gland is within normal limits in size.  The gland is diffusely inhomogeneous. A small solid nodule is noted in the lower pole  medially on the right of 7 x 6 x 8 mm. A small solid nodule is noted inferiorly on the left of 3 x 3 x 4 mm.  IMPRESSION:  The thyroid gland is within normal limits in size with only small solid nodules present, the largest of only 8 mm in diameter on the right.    . 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   . Vasomotor symptoms due to menopause 04/19/2017    Past Surgical History:  Procedure Laterality Date  . Bladder dilitation     x 3  . BREAST BIOPSY Right 2011   Benign histology  . CARDIOVASCULAR STRESS TEST  2000   Unremarkable per pt report  . CARPOMETACARPAL JOINT ARTHROTOMY Right 2011  . COLONOSCOPY    . COLONOSCOPY WITH PROPOFOL N/A 04/21/2015   Procedure: COLONOSCOPY WITH PROPOFOL;  Surgeon: Carol Ada, MD;  Location: [facility];  Service: Endoscopy;   Laterality: N/A;  . EPIDURAL BLOCK INJECTION Left 04/12/2016   Left Medial Nerve Block and Left L5 ramus block, Dr Suella Broad   . EPIDURAL BLOCK INJECTION  03/21/2016   Left L3-4 medial branch block and Left L5 & dorsal ramus block   . EPIDURAL BLOCK INJECTION N/A 10/25/2016   Suella Broad, MD. Lumbar medial branch block  . EPIDURAL BLOCK INJECTION N/A 02/09/2017   Suella Broad, MD.  Bilateral L3/4 medial branch block, bilateral L5 dorsal ramus block  . EPIDURAL BLOCK INJECTION N/A 07/04/2017   Suella Broad, MD  . FECAL TRANSPLANT  04/21/2015   Procedure: FECAL TRANSPLANT;  Surgeon: Carol Ada, MD;  Location: [facility];  Service: Endoscopy;;  . HIP ARTHROPLASTY Left   . HIP ARTHROSCOPY Left 03/06/2018   Left hip arthroplasty, redo for loose hip arthroplasty. Procedure at [facility] hospital  . INJECTION HIP INTRA ARTICULAR Left 11/2015   for OA by Dr Suella Broad  . OTHER SURGICAL HISTORY Left 2016   Left L3/L4  medial nerve block and Left L5 Dorsal Ramus block Dr Mickel Duhamel  . TOTAL HIP ARTHROPLASTY Left 07/25/2016   Procedure: LEFT TOTAL HIP ARTHROPLASTY ANTERIOR APPROACH;  Surgeon: Paralee Cancel, MD;  Location: [facility];  Service: Orthopedics;  Laterality: Left;    There were no vitals filed for this visit.  Subjective Assessment - 05/15/19 1318    Subjective  5/10 left shoulder and neck.    Currently in Pain?  Yes    Pain Score  5     Pain Location  Shoulder    Pain Orientation  Left    Pain Descriptors / Indicators  Tightness    Aggravating Factors   first wake up and laying on left side    Pain Relieving Factors  tylenol, ice, stretches                       [facility] Adult PT Treatment/Exercise - 05/15/19 0001      Neck Exercises: Theraband   Shoulder Extension  20 reps    Shoulder Extension Limitations  red    Shoulder External Rotation  20 reps    Shoulder External Rotation Limitations  red       Neck Exercises: Standing   Wall Push Ups  Limitations  wall walks red tband    UE Flexion with Stabilization Limitations  flexion & abd 2 lb to 90 & full range   x 20 flex, x 20 abdct   Upper Extremity D2  Flexion;10 reps    UE D2 Weights (lbs)  2    Other Standing Exercises  Hip hinge mechanics with and without knee bend-needed wooden dowel for correct mechanics, 10# KB dead lift to 8 inch step , then wide KB sqaut 10# simulating lift of grand child    ice pack given for hot flash   Other Standing Exercises  bicep curl in mini wall sit 3# [address] ,  tricep extension red band bilateral per HEP x 20 , wall plank with ball on wall circles      Manual Therapy   Soft tissue mobilization  left upper trap with trigger point release x 1       Neck Exercises: Stretches   Other Neck Stretches  with sheet- upper trap & first rib mob               PT Short Term Goals - 05/13/19 1410      PT SHORT TERM GOAL #1   Title  cervical rotation within 5 deg Rt to Lt    Baseline  see flowsheet    Period  Weeks    Status  Achieved      PT SHORT TERM GOAL #2   Title  pt will be able to present a proper resting posture without cues    Time  4    Period  Weeks    Status  Achieved        PT Long Term Goals - 04/01/19 1457      PT LONG TERM GOAL #1   Title  Pt will be able to play with grand children without increase in pain the next day    Baseline  reports a few days of severe pain following    Time  8    Period  Weeks    Status  New    Target Date  05/30/19      PT LONG TERM GOAL #2   Title  Pt  will be able to walk at least 1 mile without increase in pain    Baseline  pain at .5 miles at eval and would like to return to walking for exercise    Time  8    Period  Weeks    Status  New    Target Date  05/30/19      PT LONG TERM GOAL #3   Title  pt will be able to participate in outdoor activities    Baseline  avoids at this time due to pain and tightness that she feels after    Time  8    Period  Weeks    Status   New    Target Date  05/30/19      PT LONG TERM GOAL #4   Title  FOTO to 40% limited    Baseline  46% limited at eval    Time  8    Period  Weeks    Status  New    Target Date  05/30/19      PT LONG TERM GOAL #5   Title  .            Plan - 05/15/19 1411    Clinical Impression Statement  Continued with shoulder and scap stab. Began dead lifts and squats with max cues and use of wooded dowel for alignment required. Difficulty with carry over. Will use her own wooden dowel at home. Ended session with soft tissue work and trigger point release to left upper trap. She felt great after sesssion.    Examination-Activity Limitations  Locomotion Level;Lift;Dressing;Stand;Squat;Carry;Sleep;Bend;Reach Overhead;Bed Mobility    PT Next Visit Plan  did she practice hip hinge?, how was a mini vacay?    PT Home Exercise Plan  scapular retraction-resting posture, avoid crossing legs, triceps extension/biceps curls, D2 flx band/weights, wall push ups, prone GHJ flx, prone UE/LE lift, thoracic extension, seated W, row, ER, resisted wall step/return, ball-wall circle in wall plank       Patient will benefit from skilled therapeutic intervention in order to improve the following deficits and impairments:     Visit Diagnosis: Cervicalgia  Abnormality of gait  BPPV (benign paroxysmal positional vertigo), left  Dizziness and giddiness     Problem List Patient Active Problem List   Diagnosis Date Noted  . Cough in adult 03/20/2019  . 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 Pain in joint, multiple sites 11/18/2018  . DDD (degenerative disc disease), cervical 11/18/2018  . DDD (degenerative disc disease), lumbar 11/18/2018  . Positive ANA (antinuclear antibody) 11/18/2018  . Sicca syndrome ([place]) 11/18/2018  . History of colonic polyps 09/30/2018  . Chronic contact dermatitis 08/06/2018  . Chronic migraine without aura, with intractable migraine, so stated,  with status migrainosus 11/28/2017  . URI (upper respiratory infection) 08/14/2017  . Fatigue 07/05/2017  . Insulin resistance 07/04/2017  . Vitamin D deficiency 05/08/2017  . Class 1 obesity without serious comorbidity with body mass index (BMI) of 30.0 to 30.9 in adult 04/24/2017  . Vasomotor symptoms due to menopause 04/19/2017  . Periodic limb movement sleep disorder 03/28/2017  . Nocturnal hypoxemia 03/06/2017  . Morbid obesity ([place]) 03/06/2017  . Snoring 03/06/2017  . Gasping for breath 03/06/2017  . Sleep walking and eating 03/06/2017  . Episodic cluster headache, not intractable 03/06/2017  . Obesity (BMI 30.0-34.9) 01/29/2017  . Other insomnia 11/09/2016  . S/P left THA, AA 07/25/2016  . Irritable bowel syndrome with diarrhea 04/03/2016  .  Chronic migraine 02/25/2016  . Left ventricular hypertrophy, mild 02/25/2016  . History of irritable bowel syndrome 02/15/2016  . 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 Osteoarthritis of left hip 11/01/2015  . Hearing loss of both ears 07/27/2015  . Post concussive syndrome 07/14/2015  . Depression (NOS) 07/02/2015  . Allergic rhinoconjunctivitis 07/02/2015  . Fibromyalgia syndrome 07/01/2015  . History of Clostridium difficile colitis, persistent 07/01/2015  . Impairment of balance 02/10/2015  . Essential hypertension 05/15/2013  . Hypothyroidism 05/15/2013  . Hyperlipidemia 05/15/2013  . Interstitial cystitis 05/15/2013  . Chronic migraine without aura 05/15/2013  . Spondylosis of lumbar region without myelopathy or radiculopathy 10/30/2011    Almira Bar 05/15/2019, 2:18 PM  [place], [place], [address] Phone: [phone]   Fax:  [phone]  Name: Angelys Flum MRN: [id number] Date of Birth: [birth year]

## 2019-05-20 ENCOUNTER — Other Ambulatory Visit: Payer: Self-pay

## 2019-05-20 ENCOUNTER — Ambulatory Visit: Payer: BC Managed Care – PPO | Admitting: Physical Therapy

## 2019-05-20 ENCOUNTER — Encounter: Payer: Self-pay | Admitting: Physical Therapy

## 2019-05-20 DIAGNOSIS — G4489 Other headache syndrome: Secondary | ICD-10-CM

## 2019-05-20 DIAGNOSIS — M542 Cervicalgia: Secondary | ICD-10-CM | POA: Diagnosis not present

## 2019-05-20 NOTE — Therapy (Signed)
[place], [place], [address] Phone: [phone]   Fax:  [phone]  Physical Therapy Treatment  Patient Details  Name: Molly Giles MRN: [id number] Date of Birth: 30-Dec-1955 Referring Provider (PT): Debbora Presto, NP   Encounter Date: 05/20/2019  PT End of Session - 05/20/19 1412    Visit Number  10    Number of Visits  17    Date for PT Re-Evaluation  05/30/19    Authorization Type  [employer]    PT Start Time  [id number]    PT Stop Time  1412    PT Time Calculation (min)  45 min    Activity Tolerance  Patient tolerated treatment well    Behavior During Therapy  [facility] for tasks assessed/performed       Past Medical History:  Diagnosis Date  . Allergic rhinoconjunctivitis 07/02/2015  . Arthritis   . Benign positional vertigo 04/2015   Responded well to [facility]  . Bruxism (teeth grinding)   . Chronic migraine 02/25/2016   Per Dr Jaynee Eagles review in notes from [facility] from September 2015. Showed total headache days last month 18. Severe headache days 7 days. Moderate headache days 5 days. Mild headache days last month sick days. Days without headache last month 10 days. Symptoms associated with photophobia, phonophobia, osmophobia, neck pain, dizziness, jaw pain, nasal congestion, vision disturbances, tingling and numbness, weakness and worsening with activity. Each headache attack last 3 hours depending on treatment in severity. Left side, the right side, easier side, the frontal area in the back of the head. Characterized as throbbing, pressure, tightness, squeezing, stabbing and burning   . Chronic migraine w/o aura w/o status migrainosus, not intractable 07/14/2015  . DDD (degenerative disc disease), cervical 11/18/2018  . DDD (degenerative disc disease), lumbar   . Degenerative cervical disc   . Depression   . Disc displacement, lumbar   . Dysrhythmia    seen by dr Wynonia Lawman- not a problem  since she has been on Bystolic  . Essential hypertension 05/15/2013  . Family history of adverse reaction to anesthesia    Brother- N/V  . Family history of premature CAD 05/15/2013  . Fibromyalgia   . GERD (gastroesophageal reflux disease) 12/16/2014  . H/O seasonal allergies   . Hammer toe    Left great toe  . Hearing loss of both ears 07/27/2015   mild to borderline moderate low frequency hearing loss improving to within normal limits bilaterally on audiology testing at [facility] in November 2016.    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 History of colonic polyps   . History of irritable bowel syndrome 02/15/2016  . History of migraine headaches 05/15/2013   Per Dr Jaynee Eagles review in notes from [facility] from September 2015. Showed total headache days last month 18. Severe headache days 7 days. Moderate headache days 5 days. Mild headache days last month sick days. Days without headache last month 10 days. Symptoms associated with photophobia, phonophobia, osmophobia, neck pain, dizziness, jaw pain, nasal congestion, vision disturbances, tingling and numbness, weakness and worsening with activity. Each headache attack last 3 hours depending on treatment in severity. Left side, the right side, easier side, the frontal area in the back of the head. Characterized as throbbing, pressure, tightness, squeezing, stabbing and burning    . Hx of bad fall 02/2015   Severe Facial/head trauma without fracture  . Hyperlipidemia  1998  . Hypertension 2004  . Hypothyroidism   . Impairment of balance 02/2015   Consequent of postconcussive syndrome  . Injury of triangular fibrocartilage complex of left wrist 02/25/2019   Dx 02/25/19 Iran Planas IV MD [facility])  . Interstitial cystitis   . 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[place]) 03/10/2018   [facility]  . Lumbar facet joint pain   . 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  Migraine 1962   Chronic Migraines  . Mood disorder ([place])   . Musculoskeletal neck pain 07/14/2015  . Normal coronary arteries 05/14/2014  . Osteoarthritis of left hip 11/01/2015   MRI order by Dr Alvan Dame (ortho) 10/2015 showed significant arthritis of left hip joint with cystic changes in femoral head c/w osteoarthritis  . 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 Overweight   . Pain in joint of left shoulder 11/09/2016  . Palpitations 05/15/2013   [facility] manages  . Periodic limb movement sleep disorder 03/28/2017  . Perirectal cyst 05/07/2016  . PONV (postoperative nausea and vomiting)   . Post concussion syndrome 06/06/2015  . Post concussive syndrome 07/14/2015   Ms Paull's post-concussive syndrome manifesting in vertigo and headache, mood changes, poor balance, dizziness, and decreased concentration per Dr Jaynee Eagles at [facility].   Marland Kitchen Posterior vitreous detachment of right eye 2014  . Shingles   . Shortness of breath dyspnea    with exertion  . Thyroid nodule 08/11/2009   Findings: The thyroid gland is within normal limits in size.  The gland is diffusely inhomogeneous. A small solid nodule is noted in the lower pole  medially on the right of 7 x 6 x 8 mm. A small solid nodule is noted inferiorly on the left of 3 x 3 x 4 mm.  IMPRESSION:  The thyroid gland is within normal limits in size with only small solid nodules present, the largest of only 8 mm in diameter on the right.    . 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   . Vasomotor symptoms due to menopause 04/19/2017    Past Surgical History:  Procedure Laterality Date  . Bladder dilitation     x 3  . BREAST BIOPSY Right 2011   Benign histology  . CARDIOVASCULAR STRESS TEST  2000   Unremarkable per pt report  . CARPOMETACARPAL JOINT ARTHROTOMY Right 2011  . COLONOSCOPY    . COLONOSCOPY WITH PROPOFOL N/A 04/21/2015    Procedure: COLONOSCOPY WITH PROPOFOL;  Surgeon: Carol Ada, MD;  Location: [place];  Service: Endoscopy;  Laterality: N/A;  . EPIDURAL BLOCK INJECTION Left 04/12/2016   Left Medial Nerve Block and Left L5 ramus block, Dr Suella Broad   . EPIDURAL BLOCK INJECTION  03/21/2016   Left L3-4 medial branch block and Left L5 & dorsal ramus block   . EPIDURAL BLOCK INJECTION N/A 10/25/2016   Suella Broad, MD. Lumbar medial branch block  . EPIDURAL BLOCK INJECTION N/A 02/09/2017   Suella Broad, MD.  Bilateral L3/4 medial branch block, bilateral L5 dorsal ramus block  . EPIDURAL BLOCK INJECTION N/A 07/04/2017   Suella Broad, MD  . FECAL TRANSPLANT  04/21/2015   Procedure: FECAL TRANSPLANT;  Surgeon: Carol Ada, MD;  Location: [name];  Service: Endoscopy;;  . HIP ARTHROPLASTY Left   . HIP ARTHROSCOPY Left 03/06/2018   Left hip arthroplasty, redo for loose hip arthroplasty. Procedure at [facility] hospital  . INJECTION HIP  INTRA ARTICULAR Left 11/2015   for OA by Dr Suella Broad  . OTHER SURGICAL HISTORY Left 2016   Left L3/L4 medial nerve block and Left L5 Dorsal Ramus block Dr Mickel Duhamel  . TOTAL HIP ARTHROPLASTY Left 07/25/2016   Procedure: LEFT TOTAL HIP ARTHROPLASTY ANTERIOR APPROACH;  Surgeon: Paralee Cancel, MD;  Location: [facility];  Service: Orthopedics;  Laterality: Left;    There were no vitals filed for this visit.  Subjective Assessment - 05/20/19 1330    Subjective  Today is my best day in a while. went to [place] for the weekend. Sitting and watching TV is painful.    Patient Stated Goals  decrease pain, lift grandson, more activities with kids, be more active, kayak    Currently in Pain?  No/denies         [facility] PT Assessment - 05/20/19 0001      Assessment   Medical Diagnosis  migraines, cervicalgia    Referring Provider (PT)  Debbora Presto, NP                   [facility] Adult PT Treatment/Exercise - 05/20/19 0001      Neck Exercises: Standing    Wall Push Ups Limitations  wall push ups with press away    UE Flexion with Stabilization Limitations  bil flx to 90- holding 1 5lb weight    Left / Chop Limitations  OH press 3lb    Other Standing Exercises  bent over row 5lb    Other Standing Exercises  biceps curls 5lb      Neck Exercises: Supine   Other Supine Exercise  supine diagonals red tband             PT Education - 05/20/19 1412    Education Details  Left hip, exercise form/rationale & DOMS    Person(s) Educated  Patient    Methods  Explanation;Demonstration;Tactile cues;Verbal cues;Handout    Comprehension  Verbalized understanding;Returned demonstration;Verbal cues required;Tactile cues required;Need further instruction       PT Short Term Goals - 05/13/19 1410      PT SHORT TERM GOAL #1   Title  cervical rotation within 5 deg Rt to Lt    Baseline  see flowsheet    Period  Weeks    Status  Achieved      PT SHORT TERM GOAL #2   Title  pt will be able to present a proper resting posture without cues    Time  4    Period  Weeks    Status  Achieved        PT Long Term Goals - 04/01/19 1457      PT LONG TERM GOAL #1   Title  Pt will be able to play with grand children without increase in pain the next day    Baseline  reports a few days of severe pain following    Time  8    Period  Weeks    Status  New    Target Date  05/30/19      PT LONG TERM GOAL #2   Title  Pt will be able to walk at least 1 mile without increase in pain    Baseline  pain at .5 miles at eval and would like to return to walking for exercise    Time  8    Period  Weeks    Status  New    Target Date  05/30/19  PT LONG TERM GOAL #3   Title  pt will be able to participate in outdoor activities    Baseline  avoids at this time due to pain and tightness that she feels after    Time  8    Period  Weeks    Status  New    Target Date  05/30/19      PT LONG TERM GOAL #4   Title  FOTO to 40% limited    Baseline  46% limited  at eval    Time  8    Period  Weeks    Status  New    Target Date  05/30/19      PT LONG TERM GOAL #5   Title  .            Plan - 05/20/19 1702    Clinical Impression Statement  Denied need for DN or manual therapy today as she is feeling really good. Continued with functional squat and lift so she is able to lift her grand son, frequent cues required- advised that she will be sore and to be extra aware of posture/use of upper traps. we discussed her hip and I screened it quickly, she would like to add hip to her POC and will consider requesting referral for this.    PT Treatment/Interventions  ADLs/Self Care Home Management;Cryotherapy;Electrical Stimulation;Ultrasound;Moist Heat;Traction;Iontophoresis 4mg /ml Dexamethasone;Functional mobility training;Therapeutic activities;Therapeutic exercise;Neuromuscular re-education;Manual techniques;Patient/family education;Passive range of motion;Dry needling;Taping    PT Home Exercise Plan  scapular retraction-resting posture, avoid crossing legs, triceps extension/biceps curls, D2 flx band/weights, wall push ups, prone GHJ flx, prone UE/LE lift, thoracic extension, seated W, row, ER, resisted wall step/return, ball-wall circle in wall plank, OH press, bent over row, flexion with weight    Consulted and Agree with Plan of Care  Patient       Patient will benefit from skilled therapeutic intervention in order to improve the following deficits and impairments:  Decreased range of motion, Difficulty walking, Impaired UE functional use, Increased muscle spasms, Decreased activity tolerance, Pain, Improper body mechanics, Impaired flexibility, Decreased strength, Postural dysfunction  Visit Diagnosis: Cervicalgia  Other headache syndrome     Problem List Patient Active Problem List   Diagnosis Date Noted  . Cough in adult 03/20/2019  . 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 Pain in joint, multiple sites  11/18/2018  . DDD (degenerative disc disease), cervical 11/18/2018  . DDD (degenerative disc disease), lumbar 11/18/2018  . Positive ANA (antinuclear antibody) 11/18/2018  . Sicca syndrome ([place]) 11/18/2018  . History of colonic polyps 09/30/2018  . Chronic contact dermatitis 08/06/2018  . Chronic migraine without aura, with intractable migraine, so stated, with status migrainosus 11/28/2017  . URI (upper respiratory infection) 08/14/2017  . Fatigue 07/05/2017  . Insulin resistance 07/04/2017  . Vitamin D deficiency 05/08/2017  . Class 1 obesity without serious comorbidity with body mass index (BMI) of 30.0 to 30.9 in adult 04/24/2017  . Vasomotor symptoms due to menopause 04/19/2017  . Periodic limb movement sleep disorder 03/28/2017  . Nocturnal hypoxemia 03/06/2017  . Morbid obesity ([place]) 03/06/2017  . Snoring 03/06/2017  . Gasping for breath 03/06/2017  . Sleep walking and eating 03/06/2017  . Episodic cluster headache, not intractable 03/06/2017  . Obesity (BMI 30.0-34.9) 01/29/2017  . Other insomnia 11/09/2016  . S/P left THA, AA 07/25/2016  . Irritable bowel syndrome with diarrhea 04/03/2016  . Chronic migraine 02/25/2016  . Left ventricular hypertrophy, mild 02/25/2016  .  History of irritable bowel syndrome 02/15/2016  . 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 Osteoarthritis of left hip 11/01/2015  . Hearing loss of both ears 07/27/2015  . Post concussive syndrome 07/14/2015  . Depression (NOS) 07/02/2015  . Allergic rhinoconjunctivitis 07/02/2015  . Fibromyalgia syndrome 07/01/2015  . History of Clostridium difficile colitis, persistent 07/01/2015  . Impairment of balance 02/10/2015  . Essential hypertension 05/15/2013  . Hypothyroidism 05/15/2013  . Hyperlipidemia 05/15/2013  . Interstitial cystitis 05/15/2013  . Chronic migraine without aura 05/15/2013  . Spondylosis of lumbar region without myelopathy or radiculopathy 10/30/2011   Koty Anctil C.  Amiya Escamilla PT, DPT 05/20/19 5:06 PM   [facility] [address] [place], [place], [address] Phone: [phone]   Fax:  [phone]  Name: Molly Giles MRN: [id number] Date of Birth: May 30, 1956

## 2019-05-22 ENCOUNTER — Other Ambulatory Visit: Payer: Self-pay

## 2019-05-22 ENCOUNTER — Encounter: Payer: Self-pay | Admitting: Physical Therapy

## 2019-05-22 ENCOUNTER — Ambulatory Visit: Payer: BC Managed Care – PPO | Admitting: Physical Therapy

## 2019-05-22 DIAGNOSIS — G4489 Other headache syndrome: Secondary | ICD-10-CM

## 2019-05-22 DIAGNOSIS — M542 Cervicalgia: Secondary | ICD-10-CM

## 2019-05-22 NOTE — Therapy (Addendum)
[place], [place], [address] Phone: [phone]   Fax:  [phone]  Physical Therapy Treatment  Patient Details  Name: Molly Giles MRN: [id number] Date of Birth: [birth year] Referring Provider (PT): Debbora Presto, NP   Encounter Date: 05/22/2019  PT End of Session - 05/22/19 1353    Visit Number  11    Number of Visits  17    Date for PT Re-Evaluation  05/30/19    Authorization Type  [employer]    PT Start Time  1330    PT Stop Time  1415    PT Time Calculation (min)  45 min    Activity Tolerance  Patient tolerated treatment well    Behavior During Therapy  [facility] for tasks assessed/performed       Past Medical History:  Diagnosis Date  . Allergic rhinoconjunctivitis 07/02/2015  . Arthritis   . Benign positional vertigo 04/2015   Responded well to [facility]  . Bruxism (teeth grinding)   . Chronic migraine 02/25/2016   Per Dr Jaynee Eagles review in notes from [facility] from September 2015. Showed total headache days last month 18. Severe headache days 7 days. Moderate headache days 5 days. Mild headache days last month sick days. Days without headache last month 10 days. Symptoms associated with photophobia, phonophobia, osmophobia, neck pain, dizziness, jaw pain, nasal congestion, vision disturbances, tingling and numbness, weakness and worsening with activity. Each headache attack last 3 hours depending on treatment in severity. Left side, the right side, easier side, the frontal area in the back of the head. Characterized as throbbing, pressure, tightness, squeezing, stabbing and burning   . Chronic migraine w/o aura w/o status migrainosus, not intractable 07/14/2015  . DDD (degenerative disc disease), cervical 11/18/2018  . DDD (degenerative disc disease), lumbar   . Degenerative cervical disc   . Depression   . Disc displacement, lumbar   . Dysrhythmia    seen by dr Wynonia Lawman- not a problem  since she has been on Bystolic  . Essential hypertension 05/15/2013  . Family history of adverse reaction to anesthesia    Brother- N/V  . Family history of premature CAD 05/15/2013  . Fibromyalgia   . GERD (gastroesophageal reflux disease) 12/16/2014  . H/O seasonal allergies   . Hammer toe    Left great toe  . Hearing loss of both ears 07/27/2015   mild to borderline moderate low frequency hearing loss improving to within normal limits bilaterally on audiology testing at [facility] in November 2016.    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 History of colonic polyps   . History of irritable bowel syndrome 02/15/2016  . History of migraine headaches 05/15/2013   Per Dr Jaynee Eagles review in notes from [facility] from September 2015. Showed total headache days last month 18. Severe headache days 7 days. Moderate headache days 5 days. Mild headache days last month sick days. Days without headache last month 10 days. Symptoms associated with photophobia, phonophobia, osmophobia, neck pain, dizziness, jaw pain, nasal congestion, vision disturbances, tingling and numbness, weakness and worsening with activity. Each headache attack last 3 hours depending on treatment in severity. Left side, the right side, easier side, the frontal area in the back of the head. Characterized as throbbing, pressure, tightness, squeezing, stabbing and burning    . Hx of bad fall 02/2015   Severe Facial/head trauma without fracture  . Hyperlipidemia  1998  . Hypertension 2004  . Hypothyroidism   . Impairment of balance 02/2015   Consequent of postconcussive syndrome  . Injury of triangular fibrocartilage complex of left wrist 02/25/2019   Dx 02/25/19 Iran Planas IV MD [facility])  . Interstitial cystitis   . 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[place]) 03/10/2018   [facility]  . Lumbar facet joint pain   . 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  Migraine 1962   Chronic Migraines  . Mood disorder ([place])   . Musculoskeletal neck pain 07/14/2015  . Normal coronary arteries 05/14/2014  . Osteoarthritis of left hip 11/01/2015   MRI order by Dr Alvan Dame (ortho) 10/2015 showed significant arthritis of left hip joint with cystic changes in femoral head c/w osteoarthritis  . 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 Overweight   . Pain in joint of left shoulder 11/09/2016  . Palpitations 05/15/2013   [facility] manages  . Periodic limb movement sleep disorder 03/28/2017  . Perirectal cyst 05/07/2016  . PONV (postoperative nausea and vomiting)   . Post concussion syndrome 06/06/2015  . Post concussive syndrome 07/14/2015   Ms Paull's post-concussive syndrome manifesting in vertigo and headache, mood changes, poor balance, dizziness, and decreased concentration per Dr Jaynee Eagles at [facility].   Marland Kitchen Posterior vitreous detachment of right eye 2014  . Shingles   . Shortness of breath dyspnea    with exertion  . Thyroid nodule 08/11/2009   Findings: The thyroid gland is within normal limits in size.  The gland is diffusely inhomogeneous. A small solid nodule is noted in the lower pole  medially on the right of 7 x 6 x 8 mm. A small solid nodule is noted inferiorly on the left of 3 x 3 x 4 mm.  IMPRESSION:  The thyroid gland is within normal limits in size with only small solid nodules present, the largest of only 8 mm in diameter on the right.    . 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   . Vasomotor symptoms due to menopause 04/19/2017    Past Surgical History:  Procedure Laterality Date  . Bladder dilitation     x 3  . BREAST BIOPSY Right 2011   Benign histology  . CARDIOVASCULAR STRESS TEST  2000   Unremarkable per pt report  . CARPOMETACARPAL JOINT ARTHROTOMY Right 2011  . COLONOSCOPY    . COLONOSCOPY WITH PROPOFOL N/A 04/21/2015    Procedure: COLONOSCOPY WITH PROPOFOL;  Surgeon: Carol Ada, MD;  Location: [place];  Service: Endoscopy;  Laterality: N/A;  . EPIDURAL BLOCK INJECTION Left 04/12/2016   Left Medial Nerve Block and Left L5 ramus block, Dr Suella Broad   . EPIDURAL BLOCK INJECTION  03/21/2016   Left L3-4 medial branch block and Left L5 & dorsal ramus block   . EPIDURAL BLOCK INJECTION N/A 10/25/2016   Suella Broad, MD. Lumbar medial branch block  . EPIDURAL BLOCK INJECTION N/A 02/09/2017   Suella Broad, MD.  Bilateral L3/4 medial branch block, bilateral L5 dorsal ramus block  . EPIDURAL BLOCK INJECTION N/A 07/04/2017   Suella Broad, MD  . FECAL TRANSPLANT  04/21/2015   Procedure: FECAL TRANSPLANT;  Surgeon: Carol Ada, MD;  Location: [name];  Service: Endoscopy;;  . HIP ARTHROPLASTY Left   . HIP ARTHROSCOPY Left 03/06/2018   Left hip arthroplasty, redo for loose hip arthroplasty. Procedure at [facility] hospital  . INJECTION HIP  INTRA ARTICULAR Left 11/2015   for OA by Dr Suella Broad  . OTHER SURGICAL HISTORY Left 2016   Left L3/L4 medial nerve block and Left L5 Dorsal Ramus block Dr Mickel Duhamel  . TOTAL HIP ARTHROPLASTY Left 07/25/2016   Procedure: LEFT TOTAL HIP ARTHROPLASTY ANTERIOR APPROACH;  Surgeon: Paralee Cancel, MD;  Location: [facility];  Service: Orthopedics;  Laterality: Left;    There were no vitals filed for this visit.  Subjective Assessment - 05/22/19 1330    Subjective  somebody cut in front of me on [address] and I got tense and am still feeling it. I had a HA yesterday but did not wear my mouthguard the night before and I felt like I was clenching.    Patient Stated Goals  decrease pain, lift grandson, more activities with kids, be more active, kayak    Currently in Pain?  Yes    Pain Score  4     Pain Location  Shoulder    Pain Orientation  Left    Pain Descriptors / Indicators  Tightness                       [facility] Adult PT Treatment/Exercise -  05/22/19 0001      Exercises   Exercises  Other Exercises    Other Exercises   progressed HEP exercises to physioball- see pt instructions      Manual Therapy   Manual therapy comments  review use of theracane    Soft tissue mobilization  Rt levator, rhomboids      Neck Exercises: Stretches   Other Neck Stretches  with sheet- upper trap & first rib mob             PT Education - 05/22/19 1420    Education Details  POC & after d/c, exercise form/rationale    Person(s) Educated  Patient    Methods  Explanation;Demonstration;Tactile cues;Verbal cues;Handout    Comprehension  Verbalized understanding;Returned demonstration;Verbal cues required;Tactile cues required;Need further instruction       PT Short Term Goals - 05/13/19 1410      PT SHORT TERM GOAL #1   Title  cervical rotation within 5 deg Rt to Lt    Baseline  see flowsheet    Period  Weeks    Status  Achieved      PT SHORT TERM GOAL #2   Title  pt will be able to present a proper resting posture without cues    Time  4    Period  Weeks    Status  Achieved        PT Long Term Goals - 04/01/19 1457      PT LONG TERM GOAL #1   Title  Pt will be able to play with grand children without increase in pain the next day    Baseline  reports a few days of severe pain following    Time  8    Period  Weeks    Status  New    Target Date  05/30/19      PT LONG TERM GOAL #2   Title  Pt will be able to walk at least 1 mile without increase in pain    Baseline  pain at .5 miles at eval and would like to return to walking for exercise    Time  8    Period  Weeks    Status  New    Target Date  05/30/19      PT LONG TERM GOAL #3   Title  pt will be able to participate in outdoor activities    Baseline  avoids at this time due to pain and tightness that she feels after    Time  8    Period  Weeks    Status  New    Target Date  05/30/19      PT LONG TERM GOAL #4   Title  FOTO to 40% limited    Baseline  46%  limited at eval    Time  8    Period  Weeks    Status  New    Target Date  05/30/19      PT LONG TERM GOAL #5   Title  .            Plan - 05/22/19 1416    Clinical Impression Statement  Progressed all exercises to physioball for postural strength and endurance challenges. Reported feeling fatigue in post neck and we discussed progression of deep neck flexor endurance and use of previous exercises when she is unable to complete reps on physioball. Able to decrease muscle tension with STM and without DN today, no HA or tension noted following.    PT Treatment/Interventions  ADLs/Self Care Home Management;Cryotherapy;Electrical Stimulation;Ultrasound;Moist Heat;Traction;Iontophoresis 4mg /ml Dexamethasone;Functional mobility training;Therapeutic activities;Therapeutic exercise;Neuromuscular re-education;Manual techniques;Patient/family education;Passive range of motion;Dry needling;Taping    PT Next Visit Plan  review physioball exercises    PT Home Exercise Plan  scapular retraction-resting posture, avoid crossing legs, triceps extension/biceps curls, D2 flx band/weights, wall push ups, prone GHJ flx, prone UE/LE lift, thoracic extension, seated W, row, ER, resisted wall step/return, ball-wall circle in wall plank, OH press, bent over row, flexion with weight; physioball exercises    Consulted and Agree with Plan of Care  Patient       Patient will benefit from skilled therapeutic intervention in order to improve the following deficits and impairments:  Decreased range of motion, Difficulty walking, Impaired UE functional use, Increased muscle spasms, Decreased activity tolerance, Pain, Improper body mechanics, Impaired flexibility, Decreased strength, Postural dysfunction  Visit Diagnosis: Cervicalgia  Other headache syndrome     Problem List Patient Active Problem List   Diagnosis Date Noted  . Cough in adult 03/20/2019  . Injury of triangular fibrocartilage complex of left  wrist 02/25/2019  . Pain in joint, multiple sites 11/18/2018  . DDD (degenerative disc disease), cervical 11/18/2018  . DDD (degenerative disc disease), lumbar 11/18/2018  . Positive ANA (antinuclear antibody) 11/18/2018  . Sicca syndrome ([place]) 11/18/2018  . History of colonic polyps 09/30/2018  . Chronic contact dermatitis 08/06/2018  . Chronic migraine without aura, with intractable migraine, so stated, with status migrainosus 11/28/2017  . URI (upper respiratory infection) 08/14/2017  . Fatigue 07/05/2017  . Insulin resistance 07/04/2017  . Vitamin D deficiency 05/08/2017  . Class 1 obesity without serious comorbidity with body mass index (BMI) of 30.0 to 30.9 in adult 04/24/2017  . Vasomotor symptoms due to menopause 04/19/2017  . Periodic limb movement sleep disorder 03/28/2017  . Nocturnal hypoxemia 03/06/2017  . Morbid obesity ([place]) 03/06/2017  . Snoring 03/06/2017  . Gasping for breath 03/06/2017  . Sleep walking and eating 03/06/2017  . Episodic cluster headache, not intractable 03/06/2017  . Obesity (BMI 30.0-34.9) 01/29/2017  . Other insomnia 11/09/2016  . S/P left THA, AA 07/25/2016  . Irritable bowel syndrome with diarrhea 04/03/2016  . Chronic migraine 02/25/2016  . Left ventricular hypertrophy,  mild 02/25/2016  . History of irritable bowel syndrome 02/15/2016  . 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 Osteoarthritis of left hip 11/01/2015  . Hearing loss of both ears 07/27/2015  . Post concussive syndrome 07/14/2015  . Depression (NOS) 07/02/2015  . Allergic rhinoconjunctivitis 07/02/2015  . Fibromyalgia syndrome 07/01/2015  . History of Clostridium difficile colitis, persistent 07/01/2015  . Impairment of balance 02/10/2015  . Essential hypertension 05/15/2013  . Hypothyroidism 05/15/2013  . Hyperlipidemia 05/15/2013  . Interstitial cystitis 05/15/2013  . Chronic migraine without aura 05/15/2013  . Spondylosis of lumbar region without  myelopathy or radiculopathy 10/30/2011    Modine Oppenheimer C. Koby Hartfield PT, DPT 05/22/19 4:08 PM   [facility] [address] [place], [place], [address] Phone: [phone]   Fax:  [phone]  Name: Gyneth Merrow MRN: [id number] Date of Birth: 1956/01/06

## 2019-05-27 ENCOUNTER — Encounter: Payer: Self-pay | Admitting: Physical Therapy

## 2019-05-27 ENCOUNTER — Ambulatory Visit: Payer: BC Managed Care – PPO | Admitting: Physical Therapy

## 2019-05-27 ENCOUNTER — Other Ambulatory Visit: Payer: Self-pay

## 2019-05-27 DIAGNOSIS — R269 Unspecified abnormalities of gait and mobility: Secondary | ICD-10-CM

## 2019-05-27 DIAGNOSIS — G4489 Other headache syndrome: Secondary | ICD-10-CM

## 2019-05-27 DIAGNOSIS — M542 Cervicalgia: Secondary | ICD-10-CM

## 2019-05-27 NOTE — Therapy (Signed)
[facility] [place], [place], [address] Phone: [phone]   Fax:  [phone]  Physical Therapy Treatment  Patient Details  Name: Molly Giles MRN: [id number] Date of Birth: 1956-07-20 Referring Provider (PT): Debbora Presto, NP   Encounter Date: 05/27/2019  PT End of Session - 05/27/19 1323    Visit Number  12    Number of Visits  17    Date for PT Re-Evaluation  05/30/19    PT Start Time  7001    PT Stop Time  1400    PT Time Calculation (min)  41 min       Past Medical History:  Diagnosis Date  . Allergic rhinoconjunctivitis 07/02/2015  . Arthritis   . Benign positional vertigo 04/2015   Responded well to [facility]  . Bruxism (teeth grinding)   . Chronic migraine 02/25/2016   Per Dr Jaynee Eagles review in notes from [facility] from September 2015. Showed total headache days last month 18. Severe headache days 7 days. Moderate headache days 5 days. Mild headache days last month sick days. Days without headache last month 10 days. Symptoms associated with photophobia, phonophobia, osmophobia, neck pain, dizziness, jaw pain, nasal congestion, vision disturbances, tingling and numbness, weakness and worsening with activity. Each headache attack last 3 hours depending on treatment in severity. Left side, the right side, easier side, the frontal area in the back of the head. Characterized as throbbing, pressure, tightness, squeezing, stabbing and burning   . Chronic migraine w/o aura w/o status migrainosus, not intractable 07/14/2015  . DDD (degenerative disc disease), cervical 11/18/2018  . DDD (degenerative disc disease), lumbar   . Degenerative cervical disc   . Depression   . Disc displacement, lumbar   . Dysrhythmia    seen by dr Wynonia Lawman- not a problem since she has been on Bystolic  . Essential hypertension 05/15/2013  . Family history of adverse reaction to anesthesia    Brother- N/V  . Family  history of premature CAD 05/15/2013  . Fibromyalgia   . GERD (gastroesophageal reflux disease) 12/16/2014  . H/O seasonal allergies   . Hammer toe    Left great toe  . Hearing loss of both ears 07/27/2015   mild to borderline moderate low frequency hearing loss improving to within normal limits bilaterally on audiology testing at [facility] in November 2016.    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 History of colonic polyps   . History of irritable bowel syndrome 02/15/2016  . History of migraine headaches 05/15/2013   Per Dr Jaynee Eagles review in notes from [facility] from September 2015. Showed total headache days last month 18. Severe headache days 7 days. Moderate headache days 5 days. Mild headache days last month sick days. Days without headache last month 10 days. Symptoms associated with photophobia, phonophobia, osmophobia, neck pain, dizziness, jaw pain, nasal congestion, vision disturbances, tingling and numbness, weakness and worsening with activity. Each headache attack last 3 hours depending on treatment in severity. Left side, the right side, easier side, the frontal area in the back of the head. Characterized as throbbing, pressure, tightness, squeezing, stabbing and burning    . Hx of bad fall 02/2015   Severe Facial/head trauma without fracture  . Hyperlipidemia 1998  . Hypertension 2004  . Hypothyroidism   . Impairment of balance 02/2015   Consequent of postconcussive syndrome  . Injury of triangular fibrocartilage complex  of left wrist 02/25/2019   Dx 02/25/19 [facility] Plan[facility] IV MD Rosanne Gutting)  . Interstitial cystitis   . 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[place]) 03/10/2018   [facility]  . Lumbar facet joint pain   . 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 Migraine 1962   Chronic Migraines  . Mood disorder ([place])   . Musculoskeletal neck pain 07/14/2015  . Normal coronary arteries 05/14/2014  .  Osteoarthritis of left hip 11/01/2015   MRI order by Dr Alvan Dame (ortho) 10/2015 showed significant arthritis of left hip joint with cystic changes in femoral head c/w osteoarthritis  . 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 Overweight   . Pain in joint of left shoulder 11/09/2016  . Palpitations 05/15/2013   [facility] manages  . Periodic limb movement sleep disorder 03/28/2017  . Perirectal cyst 05/07/2016  . PONV (postoperative nausea and vomiting)   . Post concussion syndrome 06/06/2015  . Post concussive syndrome 07/14/2015   Ms Arceneaux's post-concussive syndrome manifesting in vertigo and headache, mood changes, poor balance, dizziness, and decreased concentration per Dr Jaynee Eagles at [facility].   Marland Kitchen Posterior vitreous detachment of right eye 2014  . Shingles   . Shortness of breath dyspnea    with exertion  . Thyroid nodule 08/11/2009   Findings: The thyroid gland is within normal limits in size.  The gland is diffusely inhomogeneous. A small solid nodule is noted in the lower pole  medially on the right of 7 x 6 x 8 mm. A small solid nodule is noted inferiorly on the left of 3 x 3 x 4 mm.  IMPRESSION:  The thyroid gland is within normal limits in size with only small solid nodules present, the largest of only 8 mm in diameter on the right.    . 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   . Vasomotor symptoms due to menopause 04/19/2017    Past Surgical History:  Procedure Laterality Date  . Bladder dilitation     x 3  . BREAST BIOPSY Right 2011   Benign histology  . CARDIOVASCULAR STRESS TEST  2000   Unremarkable per pt report  . CARPOMETACARPAL JOINT ARTHROTOMY Right 2011  . COLONOSCOPY    . COLONOSCOPY WITH PROPOFOL N/A 04/21/2015   Procedure: COLONOSCOPY WITH PROPOFOL;  Surgeon: Carol Ada, MD;  Location: [facility];  Service: Endoscopy;  Laterality: N/A;  . EPIDURAL  BLOCK INJECTION Left 04/12/2016   Left Medial Nerve Block and Left L5 ramus block, Dr Suella Broad   . EPIDURAL BLOCK INJECTION  03/21/2016   Left L3-4 medial branch block and Left L5 & dorsal ramus block   . EPIDURAL BLOCK INJECTION N/A 10/25/2016   Suella Broad, MD. Lumbar medial branch block  . EPIDURAL BLOCK INJECTION N/A 02/09/2017   Suella Broad, MD.  Bilateral L3/4 medial branch block, bilateral L5 dorsal ramus block  . EPIDURAL BLOCK INJECTION N/A 07/04/2017   Suella Broad, MD  . FECAL TRANSPLANT  04/21/2015   Procedure: FECAL TRANSPLANT;  Surgeon: Carol Ada, MD;  Location: [facility];  Service: Endoscopy;;  . HIP ARTHROPLASTY Left   . HIP ARTHROSCOPY Left 03/06/2018   Left hip arthroplasty, redo for loose hip arthroplasty. Procedure at [facility]  . INJECTION HIP INTRA ARTICULAR Left 11/2015   for OA by Dr Suella Broad  . OTHER SURGICAL HISTORY Left 2016   Left L3/L4 medial nerve block and Left  L5 Dorsal Ramus block Dr Mickel Duhamel  . TOTAL HIP ARTHROPLASTY Left 07/25/2016   Procedure: LEFT TOTAL HIP ARTHROPLASTY ANTERIOR APPROACH;  Surgeon: Paralee Cancel, MD;  Location: [facility];  Service: Orthopedics;  Laterality: Left;    There were no vitals filed for this visit.  Subjective Assessment - 05/27/19 1322    Subjective  Pain still wakes me up at night. It was prett bad this morning. I had increased pain reaching the ATM with my left arm,    Currently in Pain?  Yes    Pain Score  2     Pain Location  Shoulder    Pain Orientation  Left    Pain Descriptors / Indicators  Tightness    Aggravating Factors   first wake up, laying on left    Pain Relieving Factors  tylenol, meloxicam                       [facility] Adult PT Treatment/Exercise - 05/27/19 0001      Exercises   Other Exercises   Physioball T, Y , I , dird dogs       Neck Exercises: Standing   Wall Push Ups Limitations  counter push ups, counter plank with     Other Standing Exercises   bent over row 5lb    Other Standing Exercises  biceps curls 5lb, tricep press 5#       Neck Exercises: Supine   Other Supine Exercise  supine diagonals red tband    Other Supine Exercise  Triceps press single and bilateral 5#, lat pull over 5#       Manual Therapy   Soft tissue mobilization  left upper trap with trigger point release x 1       Neck Exercises: Stretches   Other Neck Stretches  --               PT Short Term Goals - 05/13/19 1410      PT SHORT TERM GOAL #1   Title  cervical rotation within 5 deg Rt to Lt    Baseline  see flowsheet    Period  Weeks    Status  Achieved      PT SHORT TERM GOAL #2   Title  pt will be able to present a proper resting posture without cues    Time  4    Period  Weeks    Status  Achieved        PT Long Term Goals - 04/01/19 1457      PT LONG TERM GOAL #1   Title  Pt will be able to play with grand children without increase in pain the next day    Baseline  reports a few days of severe pain following    Time  8    Period  Weeks    Status  New    Target Date  05/30/19      PT LONG TERM GOAL #2   Title  Pt will be able to walk at least 1 mile without increase in pain    Baseline  pain at .5 miles at eval and would like to return to walking for exercise    Time  8    Period  Weeks    Status  New    Target Date  05/30/19      PT LONG TERM GOAL #3   Title  pt will be able to participate in outdoor  activities    Baseline  avoids at this time due to pain and tightness that she feels after    Time  8    Period  Weeks    Status  New    Target Date  05/30/19      PT LONG TERM GOAL #4   Title  FOTO to 40% limited    Baseline  46% limited at eval    Time  8    Period  Weeks    Status  New    Target Date  05/30/19      PT LONG TERM GOAL #5   Title  .            Plan - 05/27/19 1433    Clinical Impression Statement  Reviewed new HEP with physioball and reviewed hip hinge position for bent over rows. She  requires max cues to get into correct position and needs tactile feedback with dowel along spine. Encouraged her to use dowel at home prior to bent over portions of HEP.    PT Next Visit Plan  likely discharge next visits.    PT Home Exercise Plan  scapular retraction-resting posture, avoid crossing legs, triceps extension/biceps curls, D2 flx band/weights, wall push ups, prone GHJ flx, prone UE/LE lift, thoracic extension, seated W, row, ER, resisted wall step/return, ball-wall circle in wall plank, OH press, bent over row, flexion with weight; physioball exercises       Patient will benefit from skilled therapeutic intervention in order to improve the following deficits and impairments:  Decreased range of motion, Difficulty walking, Impaired UE functional use, Increased muscle spasms, Decreased activity tolerance, Pain, Improper body mechanics, Impaired flexibility, Decreased strength, Postural dysfunction  Visit Diagnosis: Cervicalgia  Other headache syndrome  Abnormality of gait     Problem List Patient Active Problem List   Diagnosis Date Noted  . Cough in adult 03/20/2019  . 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 Pain in joint, multiple sites 11/18/2018  . DDD (degenerative disc disease), cervical 11/18/2018  . DDD (degenerative disc disease), lumbar 11/18/2018  . Positive ANA (antinuclear antibody) 11/18/2018  . Sicca syndrome ([place]) 11/18/2018  . History of colonic polyps 09/30/2018  . Chronic contact dermatitis 08/06/2018  . Chronic migraine without aura, with intractable migraine, so stated, with status migrainosus 11/28/2017  . URI (upper respiratory infection) 08/14/2017  . Fatigue 07/05/2017  . Insulin resistance 07/04/2017  . Vitamin D deficiency 05/08/2017  . Class 1 obesity without serious comorbidity with body mass index (BMI) of 30.0 to 30.9 in adult 04/24/2017  . Vasomotor symptoms due to menopause 04/19/2017  . Periodic limb  movement sleep disorder 03/28/2017  . Nocturnal hypoxemia 03/06/2017  . Morbid obesity ([place]) 03/06/2017  . Snoring 03/06/2017  . Gasping for breath 03/06/2017  . Sleep walking and eating 03/06/2017  . Episodic cluster headache, not intractable 03/06/2017  . Obesity (BMI 30.0-34.9) 01/29/2017  . Other insomnia 11/09/2016  . S/P left THA, AA 07/25/2016  . Irritable bowel syndrome with diarrhea 04/03/2016  . Chronic migraine 02/25/2016  . Left ventricular hypertrophy, mild 02/25/2016  . History of irritable bowel syndrome 02/15/2016  . 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 Osteoarthritis of left hip 11/01/2015  . Hearing loss of both ears 07/27/2015  . Post concussive syndrome 07/14/2015  . Depression (NOS) 07/02/2015  . Allergic rhinoconjunctivitis 07/02/2015  . Fibromyalgia syndrome 07/01/2015  . History of Clostridium difficile colitis, persistent 07/01/2015  . Impairment  of balance 02/10/2015  . Essential hypertension 05/15/2013  . Hypothyroidism 05/15/2013  . Hyperlipidemia 05/15/2013  . Interstitial cystitis 05/15/2013  . Chronic migraine without aura 05/15/2013  . Spondylosis of lumbar region without myelopathy or radiculopathy 10/30/2011    Dorene Ar, PTA 05/27/2019, 2:35 PM  [place], [place], [address] Phone: [phone]   Fax:  [phone]  Name: Molly Giles MRN: [id number] Date of Birth: [birth year]

## 2019-05-29 ENCOUNTER — Ambulatory Visit: Payer: BC Managed Care – PPO | Admitting: Family Medicine

## 2019-05-29 ENCOUNTER — Ambulatory Visit: Payer: BC Managed Care – PPO | Admitting: Physical Therapy

## 2019-05-29 NOTE — Progress Notes (Deleted)
tsh

## 2019-06-03 ENCOUNTER — Ambulatory Visit: Payer: BC Managed Care – PPO | Admitting: Physical Therapy

## 2019-06-03 ENCOUNTER — Encounter: Payer: Self-pay | Admitting: Physical Therapy

## 2019-06-03 ENCOUNTER — Other Ambulatory Visit: Payer: Self-pay

## 2019-06-03 ENCOUNTER — Other Ambulatory Visit: Payer: Self-pay | Admitting: Family Medicine

## 2019-06-03 DIAGNOSIS — M542 Cervicalgia: Secondary | ICD-10-CM | POA: Diagnosis not present

## 2019-06-03 DIAGNOSIS — G4489 Other headache syndrome: Secondary | ICD-10-CM

## 2019-06-03 NOTE — Therapy (Signed)
[place], [place], [address] Phone: [phone]   Fax:  [phone]  Physical Therapy Treatment/Discharge  Patient Details  Name: Molly Giles MRN: [id number] Date of Birth: Mar 14, 1956 Referring Provider (PT): Debbora Presto, NP   Encounter Date: 06/03/2019  PT End of Session - 06/03/19 1520    Visit Number  13    Number of Visits  17    Authorization Type  [employer]    PT Start Time  1502    PT Stop Time  1534    PT Time Calculation (min)  32 min    Activity Tolerance  Patient tolerated treatment well    Behavior During Therapy  [facility] for tasks assessed/performed       Past Medical History:  Diagnosis Date  . Allergic rhinoconjunctivitis 07/02/2015  . Arthritis   . Benign positional vertigo 04/2015   Responded well to [facility]  . Bruxism (teeth grinding)   . Chronic migraine 02/25/2016   Per Dr Jaynee Eagles review in notes from [facility] from September 2015. Showed total headache days last month 18. Severe headache days 7 days. Moderate headache days 5 days. Mild headache days last month sick days. Days without headache last month 10 days. Symptoms associated with photophobia, phonophobia, osmophobia, neck pain, dizziness, jaw pain, nasal congestion, vision disturbances, tingling and numbness, weakness and worsening with activity. Each headache attack last 3 hours depending on treatment in severity. Left side, the right side, easier side, the frontal area in the back of the head. Characterized as throbbing, pressure, tightness, squeezing, stabbing and burning   . Chronic migraine w/o aura w/o status migrainosus, not intractable 07/14/2015  . DDD (degenerative disc disease), cervical 11/18/2018  . DDD (degenerative disc disease), lumbar   . Degenerative cervical disc   . Depression   . Disc displacement, lumbar   . Dysrhythmia    seen by dr Wynonia Lawman- not a problem since she has been on Bystolic   . Essential hypertension 05/15/2013  . Family history of adverse reaction to anesthesia    Brother- N/V  . Family history of premature CAD 05/15/2013  . Fibromyalgia   . GERD (gastroesophageal reflux disease) 12/16/2014  . H/O seasonal allergies   . Hammer toe    Left great toe  . Hearing loss of both ears 07/27/2015   mild to borderline moderate low frequency hearing loss improving to within normal limits bilaterally on audiology testing at [facility] in November 2016.    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 History of colonic polyps   . History of irritable bowel syndrome 02/15/2016  . History of migraine headaches 05/15/2013   Per Dr Jaynee Eagles review in notes from [facility] from September 2015. Showed total headache days last month 18. Severe headache days 7 days. Moderate headache days 5 days. Mild headache days last month sick days. Days without headache last month 10 days. Symptoms associated with photophobia, phonophobia, osmophobia, neck pain, dizziness, jaw pain, nasal congestion, vision disturbances, tingling and numbness, weakness and worsening with activity. Each headache attack last 3 hours depending on treatment in severity. Left side, the right side, easier side, the frontal area in the back of the head. Characterized as throbbing, pressure, tightness, squeezing, stabbing and burning    . Hx of bad fall 02/2015   Severe Facial/head trauma without fracture  . Hyperlipidemia 1998  . Hypertension 2004  . Hypothyroidism   .  Impairment of balance 02/2015   Consequent of postconcussive syndrome  . Injury of triangular fibrocartilage complex of left wrist 02/25/2019   Dx 02/25/19 Iran Planas IV MD [facility])  . Interstitial cystitis   . 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[facility]) 03/10/2018   [facility]  . Lumbar facet joint pain   . 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 Migraine 1962   Chronic  Migraines  . Mood disorder ([place])   . Musculoskeletal neck pain 07/14/2015  . Normal coronary arteries 05/14/2014  . Osteoarthritis of left hip 11/01/2015   MRI order by Dr Alvan Dame (ortho) 10/2015 showed significant arthritis of left hip joint with cystic changes in femoral head c/w osteoarthritis  . 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 Overweight   . Pain in joint of left shoulder 11/09/2016  . Palpitations 05/15/2013   [facility] manages  . Periodic limb movement sleep disorder 03/28/2017  . Perirectal cyst 05/07/2016  . PONV (postoperative nausea and vomiting)   . Post concussion syndrome 06/06/2015  . Post concussive syndrome 07/14/2015   Ms Corso's post-concussive syndrome manifesting in vertigo and headache, mood changes, poor balance, dizziness, and decreased concentration per Dr Jaynee Eagles at [facility].   Marland Kitchen Posterior vitreous detachment of right eye 2014  . Shingles   . Shortness of breath dyspnea    with exertion  . Thyroid nodule 08/11/2009   Findings: The thyroid gland is within normal limits in size.  The gland is diffusely inhomogeneous. A small solid nodule is noted in the lower pole  medially on the right of 7 x 6 x 8 mm. A small solid nodule is noted inferiorly on the left of 3 x 3 x 4 mm.  IMPRESSION:  The thyroid gland is within normal limits in size with only small solid nodules present, the largest of only 8 mm in diameter on the right.    . 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   . Vasomotor symptoms due to menopause 04/19/2017    Past Surgical History:  Procedure Laterality Date  . Bladder dilitation     x 3  . BREAST BIOPSY Right 2011   Benign histology  . CARDIOVASCULAR STRESS TEST  2000   Unremarkable per pt report  . CARPOMETACARPAL JOINT ARTHROTOMY Right 2011  . COLONOSCOPY    . COLONOSCOPY WITH PROPOFOL N/A 04/21/2015   Procedure: COLONOSCOPY WITH  PROPOFOL;  Surgeon: Carol Ada, MD;  Location: [place];  Service: Endoscopy;  Laterality: N/A;  . EPIDURAL BLOCK INJECTION Left 04/12/2016   Left Medial Nerve Block and Left L5 ramus block, Dr Suella Broad   . EPIDURAL BLOCK INJECTION  03/21/2016   Left L3-4 medial branch block and Left L5 & dorsal ramus block   . EPIDURAL BLOCK INJECTION N/A 10/25/2016   Suella Broad, MD. Lumbar medial branch block  . EPIDURAL BLOCK INJECTION N/A 02/09/2017   Suella Broad, MD.  Bilateral L3/4 medial branch block, bilateral L5 dorsal ramus block  . EPIDURAL BLOCK INJECTION N/A 07/04/2017   Suella Broad, MD  . FECAL TRANSPLANT  04/21/2015   Procedure: FECAL TRANSPLANT;  Surgeon: Carol Ada, MD;  Location: [place];  Service: Endoscopy;;  . HIP ARTHROPLASTY Left   . HIP ARTHROSCOPY Left 03/06/2018   Left hip arthroplasty, redo for loose hip arthroplasty. Procedure at [facility] hospital  . INJECTION HIP INTRA ARTICULAR Left 11/2015   for OA by Dr Delfino Lovett  [place]  . OTHER SURGICAL HISTORY Left 2016   Left L3/L4 medial nerve block and Left L5 Dorsal Ramus block Dr Mickel Duhamel  . TOTAL HIP ARTHROPLASTY Left 07/25/2016   Procedure: LEFT TOTAL HIP ARTHROPLASTY ANTERIOR APPROACH;  Surgeon: Paralee Cancel, MD;  Location: [facility];  Service: Orthopedics;  Laterality: Left;    There were no vitals filed for this visit.  Subjective Assessment - 06/03/19 1507    Subjective  Pain on right shoulder today. Sleeping seems to be the most aggrivating factor.    Patient Stated Goals  decrease pain, lift grandson, more activities with kids, be more active, kayak    Currently in Pain?  Yes    Pain Score  4     Pain Location  Shoulder    Pain Orientation  Right    Pain Descriptors / Indicators  Sore    Pain Radiating Towards  lower cervical/upper thoracic    Aggravating Factors   reaching, sleeping         [facility] PT Assessment - 06/03/19 0001      Assessment   Medical Diagnosis  migraines, cervicalgia     Referring Provider (PT)  Lomax, Amy, NP      Observation/Other Assessments   Focus on Therapeutic Outcomes (FOTO)   42% limited      AROM   Cervical Flexion  52    Cervical - Right Side Bend  38    Cervical - Left Side Bend  40    Cervical - Right Rotation  64    Cervical - Left Rotation  66      Strength   Overall Strength Comments  gross 5/5 without pain      Palpation   Palpation comment  TTP C5-7                           PT Education - 06/03/19 1550    Education Details  goals, FOTO, long term care    Person(s) Educated  Patient    Methods  Explanation    Comprehension  Verbalized understanding       PT Short Term Goals - 05/13/19 1410      PT SHORT TERM GOAL #1   Title  cervical rotation within 5 deg Rt to Lt    Baseline  see flowsheet    Period  Weeks    Status  Achieved      PT SHORT TERM GOAL #2   Title  pt will be able to present a proper resting posture without cues    Time  4    Period  Weeks    Status  Achieved        PT Long Term Goals - 06/03/19 1515      PT LONG TERM GOAL #1   Title  Pt will be able to play with grand children without increase in pain the next day    Baseline  seeing them Saturday for the first time in a while    Status  Unable to assess      PT LONG TERM GOAL #2   Title  Pt will be able to walk at least 1 mile without increase in pain    Status  Achieved      PT LONG TERM GOAL #3   Title  pt will be able to participate in outdoor activities    Status  Unable to assess      PT  LONG TERM GOAL #4   Title  FOTO to 40% limited    Baseline  42% limited    Status  Not Met            Plan - 06/03/19 1551    Clinical Impression Statement  Pt has reached the end of her POC and feels ready to d/c to independent program. she has not seen her grand children in a while so I was unable to assess those goals pertaining to play. Asked her to try a differrent bed in her house and to do 3 exercises before  getting out of bed in the morning. Encouraged her to contact us with any further questions. Felt comfortable with her HEP as it was reviewed at her last visit.    PT Treatment/Interventions  ADLs/Self Care Home Management;Cryotherapy;Electrical Stimulation;Ultrasound;Moist Heat;Traction;Iontophoresis 64m/ml Dexamethasone;Functional mobility training;Therapeutic activities;Therapeutic exercise;Neuromuscular re-education;Manual techniques;Patient/family education;Passive range of motion;Dry needling;Taping    PT Home Exercise Plan  scapular retraction-resting posture, avoid crossing legs, triceps extension/biceps curls, D2 flx band/weights, wall push ups, prone GHJ flx, prone UE/LE lift, thoracic extension, seated W, row, ER, resisted wall step/return, ball-wall circle in wall plank, OH press, bent over row, flexion with weight; physioball exercises    Consulted and Agree with Plan of Care  Patient       Patient will benefit from skilled therapeutic intervention in order to improve the following deficits and impairments:  Decreased range of motion, Difficulty walking, Impaired UE functional use, Increased muscle spasms, Decreased activity tolerance, Pain, Improper body mechanics, Impaired flexibility, Decreased strength, Postural dysfunction  Visit Diagnosis: Cervicalgia  Other headache syndrome     Problem List Patient Active Problem List   Diagnosis Date Noted  . Cough in adult 03/20/2019  . Pain in joint, multiple sites 11/18/2018  . DDD (degenerative disc disease), cervical 11/18/2018  . DDD (degenerative disc disease), lumbar 11/18/2018  . Positive ANA (antinuclear antibody) 11/18/2018  . 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 History of colonic polyps 09/30/2018  . Chronic contact dermatitis 08/06/2018  . URI (upper respiratory infection) 08/14/2017  . Fatigue 07/05/2017  . Insulin resistance 07/04/2017  . Vitamin D deficiency 05/08/2017  . Class 1 obesity without serious comorbidity  with body mass index (BMI) of 30.0 to 30.9 in adult 04/24/2017  . Vasomotor symptoms due to menopause 04/19/2017  . Periodic limb movement sleep disorder 03/28/2017  . Nocturnal hypoxemia 03/06/2017  . Morbid obesity ([place] 03/06/2017  . Snoring 03/06/2017  . Gasping for breath 03/06/2017  . Sleep walking and eating 03/06/2017  . Episodic cluster headache, not intractable 03/06/2017  . Obesity (BMI 30.0-34.9) 01/29/2017  . Other insomnia 11/09/2016  . S/P left THA, AA 07/25/2016  . Irritable bowel syndrome with diarrhea 04/03/2016  . Left ventricular hypertrophy, mild 02/25/2016  . 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 Hearing loss of both ears 07/27/2015  . Post concussive syndrome 07/14/2015  . Depression (NOS) 07/02/2015  . Allergic rhinoconjunctivitis 07/02/2015  . Fibromyalgia syndrome 07/01/2015  . History of Clostridium difficile colitis, persistent 07/01/2015  . Impairment of balance 02/10/2015  . Essential hypertension 05/15/2013  . Hypothyroidism 05/15/2013  . Hyperlipidemia 05/15/2013  . Interstitial cystitis 05/15/2013  . Chronic migraine without aura 05/15/2013  . Spondylosis of lumbar region without myelopathy or radiculopathy 10/30/2011   PHYSICAL THERAPY DISCHARGE SUMMARY  Visits from Start of Care: 13  Current functional level related to goals / functional outcomes: See above   Remaining deficits: See above   Education /  Equipment: Anatomy of condition, POC, HEP, exercise form/rationale  Plan: Patient agrees to discharge.  Patient goals were partially met. Patient is being discharged due to the patient's request.  ?????     Antonio Woodhams C. Jayde Mcallister PT, DPT 06/03/19 3:56 PM   [facility] [address] [place], [place], [address] Phone: [phone]   Fax:  [phone]  Name: Molly Giles MRN: [id number] Date of Birth: [birth year]

## 2019-06-04 ENCOUNTER — Other Ambulatory Visit: Payer: Self-pay | Admitting: Rheumatology

## 2019-06-04 DIAGNOSIS — M797 Fibromyalgia: Secondary | ICD-10-CM

## 2019-06-05 ENCOUNTER — Telehealth: Payer: Self-pay | Admitting: Rheumatology

## 2019-06-05 NOTE — Telephone Encounter (Signed)
Last Visit: 02/04/19 Next Visit: 08/04/19  Okay to refill Ambien?  

## 2019-06-05 NOTE — Telephone Encounter (Signed)
yes

## 2019-06-05 NOTE — Telephone Encounter (Signed)
Patient advised prescription was faxed to the pharmacy.

## 2019-06-05 NOTE — Telephone Encounter (Signed)
Patient called to check on her prescription refill of Zolpidem.  Patient states she is completely out of medication and is requesting to pick it up today.

## 2019-06-09 ENCOUNTER — Other Ambulatory Visit: Payer: Self-pay | Admitting: Rheumatology

## 2019-06-09 DIAGNOSIS — F3289 Other specified depressive episodes: Secondary | ICD-10-CM

## 2019-06-09 NOTE — Telephone Encounter (Signed)
Last Visit: 02/04/19 Next Visit: 08/04/19  Okay to refill per Dr. Deveshwar  

## 2019-06-13 ENCOUNTER — Other Ambulatory Visit: Payer: Self-pay | Admitting: Family Medicine

## 2019-06-16 ENCOUNTER — Ambulatory Visit: Payer: BC Managed Care – PPO | Admitting: Family Medicine

## 2019-06-16 ENCOUNTER — Other Ambulatory Visit: Payer: Self-pay

## 2019-06-16 ENCOUNTER — Encounter: Payer: Self-pay | Admitting: Family Medicine

## 2019-06-16 VITALS — BP 118/86 | HR 96 | Temp 96.9°F | Ht 66.5 in | Wt 203.0 lb

## 2019-06-16 DIAGNOSIS — Z8669 Personal history of other diseases of the nervous system and sense organs: Secondary | ICD-10-CM

## 2019-06-16 DIAGNOSIS — G43711 Chronic migraine without aura, intractable, with status migrainosus: Secondary | ICD-10-CM | POA: Diagnosis not present

## 2019-06-16 DIAGNOSIS — F339 Major depressive disorder, recurrent, unspecified: Secondary | ICD-10-CM

## 2019-06-16 DIAGNOSIS — M542 Cervicalgia: Secondary | ICD-10-CM

## 2019-06-16 DIAGNOSIS — E038 Other specified hypothyroidism: Secondary | ICD-10-CM

## 2019-06-16 DIAGNOSIS — M797 Fibromyalgia: Secondary | ICD-10-CM

## 2019-06-16 MED ORDER — EMGALITY 120 MG/ML ~~LOC~~ SOAJ: 120.0000 mg | SUBCUTANEOUS | 3 refills | Status: DC

## 2019-06-16 MED ORDER — NURTEC 75 MG PO TBDP: 75.0000 mg | ORAL_TABLET | Freq: Every day | ORAL | 11 refills | Status: DC | PRN

## 2019-06-16 NOTE — Patient Instructions (Signed)
Continue Emgality monthly  We will try Nurtec, 1 tablet at onset of migraine  Avoid regular use of OTC analgesics, Relpax or Nurtec   Follow up in 1 year, sooner   Migraine Headache A migraine headache is a very strong throbbing pain on one side or both sides of your head. This type of headache can also cause other symptoms. It can last from 4 hours to 3 days. Talk with your doctor about what things may bring on (trigger) this condition. What are the causes? The exact cause of this condition is not known. This condition may be triggered or caused by:  Drinking alcohol.  Smoking.  Taking medicines, such as: ? Medicine used to treat chest pain (nitroglycerin). ? Birth control pills. ? Estrogen. ? Some blood pressure medicines.  Eating or drinking certain products.  Doing physical activity. Other things that may trigger a migraine headache include:  Having a menstrual period.  Pregnancy.  Hunger.  Stress.  Not getting enough sleep or getting too much sleep.  Weather changes.  Tiredness (fatigue). What increases the risk?  Being 31-81 years old.  Being female.  Having a family history of migraine headaches.  Being Caucasian.  Having depression or anxiety.  Being very overweight. What are the signs or symptoms?  A throbbing pain. This pain may: ? Happen in any area of the head, such as on one side or both sides. ? Make it hard to do daily activities. ? Get worse with physical activity. ? Get worse around bright lights or loud noises.  Other symptoms may include: ? Feeling sick to your stomach (nauseous). ? Vomiting. ? Dizziness. ? Being sensitive to bright lights, loud noises, or smells.  Before you get a migraine headache, you may get warning signs (an aura). An aura may include: ? Seeing flashing lights or having blind spots. ? Seeing bright spots, halos, or zigzag lines. ? Having tunnel vision or blurred vision. ? Having numbness or a tingling  feeling. ? Having trouble talking. ? Having weak muscles.  Some people have symptoms after a migraine headache (postdromal phase), such as: ? Tiredness. ? Trouble thinking (concentrating). How is this treated?  Taking medicines that: ? Relieve pain. ? Relieve the feeling of being sick to your stomach. ? Prevent migraine headaches.  Treatment may also include: ? Having acupuncture. ? Avoiding foods that bring on migraine headaches. ? Learning ways to control your body functions (biofeedback). ? Therapy to help you know and deal with negative thoughts (cognitive behavioral therapy). Follow these instructions at home: Medicines  Take over-the-counter and prescription medicines only as told by your doctor.  Ask your doctor if the medicine prescribed to you: ? Requires you to avoid driving or using heavy machinery. ? Can cause trouble pooping (constipation). You may need to take these steps to prevent or treat trouble pooping:  Drink enough fluid to keep your pee (urine) pale yellow.  Take over-the-counter or prescription medicines.  Eat foods that are high in fiber. These include beans, whole grains, and fresh fruits and vegetables.  Limit foods that are high in fat and sugar. These include fried or sweet foods. Lifestyle  Do not drink alcohol.  Do not use any products that contain nicotine or tobacco, such as cigarettes, e-cigarettes, and chewing tobacco. If you need help quitting, ask your doctor.  Get at least 8 hours of sleep every night.  Limit and deal with stress. General instructions      Keep a journal to find out  what may bring on your migraine headaches. For example, write down: ? What you eat and drink. ? How much sleep you get. ? Any change in what you eat or drink. ? Any change in your medicines.  If you have a migraine headache: ? Avoid things that make your symptoms worse, such as bright lights. ? It may help to lie down in a dark, quiet room. ?  Do not drive or use heavy machinery. ? Ask your doctor what activities are safe for you.  Keep all follow-up visits as told by your doctor. This is important. Contact a doctor if:  You get a migraine headache that is different or worse than others you have had.  You have more than 15 headache days in one month. Get help right away if:  Your migraine headache gets very bad.  Your migraine headache lasts longer than 72 hours.  You have a fever.  You have a stiff neck.  You have trouble seeing.  Your muscles feel weak or like you cannot control them.  You start to lose your balance a lot.  You start to have trouble walking.  You pass out (faint).  You have a seizure. Summary  A migraine headache is a very strong throbbing pain on one side or both sides of your head. These headaches can also cause other symptoms.  This condition may be treated with medicines and changes to your lifestyle.  Keep a journal to find out what may bring on your migraine headaches.  Contact a doctor if you get a migraine headache that is different or worse than others you have had.  Contact your doctor if you have more than 15 headache days in a month. This information is not intended to replace advice given to you by your health care provider. Make sure you discuss any questions you have with your health care provider. Document Released: 06/06/2008 Document Revised: 12/20/2018 Document Reviewed: 10/10/2018 Elsevier Patient Education  2020 [employer].

## 2019-06-16 NOTE — Progress Notes (Addendum)
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 (gastroesophageal reflux disease) (12/16/2014), H/O seasonal allergies, Hammer toe, Hearing loss of both ears (07/27/2015), History of Clostridium difficile colitis (07/01/2015), History of colonic polyps, History of irritable bowel syndrome (02/15/2016), History of migraine headaches (05/15/2013), bad fall (02/2015), Hyperlipidemia (1998), Hypertension (2004), Hypothyroidism, Impairment of balance (02/2015), Injury of triangular fibrocartilage complex of left wrist (02/25/2019), Interstitial cystitis, 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[place]) (03/10/2018), Lumbar facet joint pain, 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), Migraine (1962), Mood disorder ([place]),  Musculoskeletal neck pain (07/14/2015), Normal coronary arteries (05/14/2014), 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 here with     ICD-10-CM   1. Chronic migraine without aura, with intractable migraine, so stated, with status migrainosus  G43.711   2. Neck pain  M54.2   3. Fibromyalgia syndrome  M79.7   4. Episode of recurrent major depressive disorder, unspecified depression episode severity ([place])  F33.9   5. Other specified hypothyroidism  E03.8   6. History of migraine headaches  Z86.69 Galcanezumab-gnlm (EMGALITY) 120 MG/ML SOAJ    Overall Mrs. Osei is doing fairly well.  Unfortunately, she was scared to start Emgality therapy in June.  She was able to perform injections last night.  We will continue to monitor for benefit after starting new CGRP.  She does not feel that Ubrelvy be helped much in the past.  We will try Nurtec.  She was advised to avoid CGRP with triptan or any other over-the-counter analgesic due to concerns of rebound headaches.  She will continue close follow-up with rheumatology, chiropractic care and massage for fibromyalgia and neck pain.  Physical therapy for dry needling also encouraged.  She may call for continued referral if needed.  She will follow-up with me in 1 year, sooner if needed.  She verbalizes understanding and agreement with this plan.   No orders of the defined types were placed in this encounter.  Meds ordered this encounter  Medications   Rimegepant Sulfate (NURTEC) 75 MG TBDP    Sig: Take 75 mg by mouth daily as needed (take for abortive therapy of migraine, no more than 1 tablet in 24 hours or 10 per month).    Dispense:  10 tablet    Refill:  11    Order Specific Question:   Supervising Provider    Answer:   Melvenia Beam [id number]   Galcanezumab-gnlm (EMGALITY) 120 MG/ML SOAJ    Sig: Inject 120 mg into the skin every 30 (thirty) days.    Dispense:  3 pen    Refill:  3    Order Specific Question:   Supervising Provider    Answer:   Melvenia Beam [id number]      I spent 15 minutes with the patient. 50% of this time was spent counseling and educating patient on plan of care and medications.    Debbora Presto, FNP-C 06/16/2019, 11:18 AM [name] [address], [place], [place] [address] [phone]  Made any corrections needed, and agree with history, physical, neuro exam,assessment and plan as stated.     Sarina Ill, MD [name]

## 2019-06-26 ENCOUNTER — Other Ambulatory Visit: Payer: Self-pay

## 2019-06-26 ENCOUNTER — Ambulatory Visit: Payer: BC Managed Care – PPO | Admitting: Family Medicine

## 2019-06-26 ENCOUNTER — Encounter: Payer: Self-pay | Admitting: Family Medicine

## 2019-06-26 VITALS — BP 138/82 | HR 109 | Wt 202.0 lb

## 2019-06-26 DIAGNOSIS — R81 Glycosuria: Secondary | ICD-10-CM

## 2019-06-26 DIAGNOSIS — Z23 Encounter for immunization: Secondary | ICD-10-CM | POA: Diagnosis not present

## 2019-06-26 DIAGNOSIS — I10 Essential (primary) hypertension: Secondary | ICD-10-CM

## 2019-06-26 LAB — POCT GLYCOSYLATED HEMOGLOBIN (HGB A1C): Hemoglobin A1C: 5.2 % (ref 4.0–5.6)

## 2019-06-26 NOTE — Patient Instructions (Addendum)
It was great to meet you today! Thank you for letting me participate in your care!  Today, we discussed the finding of sugar in your urine. Your A1c which is a measure of your average blood sugar of the last 3 months is 5.2% which is normal. I will check your kidney function today and call you with results.  Be well, Harolyn Rutherford, DO PGY-3, [facility]

## 2019-06-26 NOTE — Assessment & Plan Note (Signed)
Patient went to Urgent care due to concern for a UTI. While there she was told she had sugar in her urine. - A1c today was normal - Checking BMP today to ensure no kidney damage

## 2019-06-26 NOTE — Progress Notes (Signed)
     Subjective: Chief Complaint  Patient presents with  . sugar in urine     HPI: Molly Giles is a 63 y.o. presenting to clinic today to discuss the following:  Glycosuira Patient with PMH of interstitial cystitis had symptoms of UTI this past weekend. She went to urgent care and got her urine tested which showed glucose in her urine. She came here for follow up. She was started on an antibiotic for her UTI and is feeling better. No vision changes, no polydipsia, no polyuria, or polyphagia.  ROS noted in HPI.    Social History   Tobacco Use  Smoking Status Passive Smoke Exposure - Never Smoker  Smokeless Tobacco Never Used  Tobacco Comment   As a child   Objective: BP 138/82   Pulse (!) 109   Wt 202 lb (91.6 kg)   SpO2 98%   BMI 32.12 kg/m  Vitals and nursing notes reviewed  Physical Exam Gen: Alert and Oriented x 3, NAD HEENT: Normocephalic, atraumatic Ext: no clubbing, cyanosis, or edema Skin: warm, dry, intact, no rashes  Results for orders placed or performed in visit on 06/26/19 (from the past 72 hour(s))  HgB A1c     Status: None   Collection Time: 06/26/19  2:05 PM  Result Value Ref Range   Hemoglobin A1C 5.2 4.0 - 5.6 %   HbA1c POC (<> result, manual entry)     HbA1c, POC (prediabetic range)     HbA1c, POC (controlled diabetic range)      Assessment/Plan:  Glycosuria Patient went to Urgent care due to concern for a UTI. While there she was told she had sugar in her urine. - A1c today was normal - Checking BMP today to ensure no kidney damage   PATIENT EDUCATION PROVIDED: See AVS    Diagnosis and plan along with any newly prescribed medication(s) were discussed in detail with this patient today. The patient verbalized understanding and agreed with the plan. Patient advised if symptoms worsen return to clinic or ER.   Health Maintainance: influenza vaccine   Orders Placed This Encounter  Procedures  . HgB A1c    No orders of the  defined types were placed in this encounter.    Molly Rutherford, DO 06/26/2019, 2:24 PM PGY-3 [place]

## 2019-06-27 LAB — BASIC METABOLIC PANEL
BUN/Creatinine Ratio: 22 (ref 12–28)
BUN: 19 mg/dL (ref 8–27)
CO2: 22 mmol/L (ref 20–29)
Calcium: 9.9 mg/dL (ref 8.7–10.3)
Chloride: 105 mmol/L (ref 96–106)
Creatinine, Ser: 0.85 mg/dL (ref 0.57–1.00)
GFR calc Af Amer: 84 mL/min/{1.73_m2} (ref >59)
GFR calc non Af Amer: 73 mL/min/{1.73_m2} (ref >59)
Glucose: 84 mg/dL (ref 65–99)
Potassium: 4.4 mmol/L (ref 3.5–5.2)
Sodium: 139 mmol/L (ref 134–144)

## 2019-07-01 ENCOUNTER — Telehealth: Payer: Self-pay | Admitting: *Deleted

## 2019-07-01 NOTE — Telephone Encounter (Signed)
Pt would like to know her results from last appt. Christen Bame, CMA

## 2019-07-02 NOTE — Telephone Encounter (Signed)
Pt informed. Deseree Blount, CMA  

## 2019-07-05 ENCOUNTER — Emergency Department (HOSPITAL_BASED_OUTPATIENT_CLINIC_OR_DEPARTMENT_OTHER)
Admission: EM | Admit: 2019-07-05 | Discharge: 2019-07-05 | Disposition: A | Discharge status: Home / Self Care | Payer: BC Managed Care – PPO | Attending: Emergency Medicine | Admitting: Emergency Medicine

## 2019-07-05 ENCOUNTER — Other Ambulatory Visit: Payer: Self-pay

## 2019-07-05 ENCOUNTER — Encounter (HOSPITAL_BASED_OUTPATIENT_CLINIC_OR_DEPARTMENT_OTHER): Payer: Self-pay

## 2019-07-05 DIAGNOSIS — Z91040 Latex allergy status: Secondary | ICD-10-CM | POA: Insufficient documentation

## 2019-07-05 DIAGNOSIS — Z7722 Contact with and (suspected) exposure to environmental tobacco smoke (acute) (chronic): Secondary | ICD-10-CM | POA: Diagnosis not present

## 2019-07-05 DIAGNOSIS — I10 Essential (primary) hypertension: Secondary | ICD-10-CM | POA: Diagnosis not present

## 2019-07-05 DIAGNOSIS — E039 Hypothyroidism, unspecified: Secondary | ICD-10-CM | POA: Insufficient documentation

## 2019-07-05 DIAGNOSIS — A0472 Enterocolitis due to Clostridium difficile, not specified as recurrent: Secondary | ICD-10-CM | POA: Diagnosis not present

## 2019-07-05 DIAGNOSIS — E860 Dehydration: Secondary | ICD-10-CM | POA: Diagnosis not present

## 2019-07-05 DIAGNOSIS — R531 Weakness: Secondary | ICD-10-CM | POA: Diagnosis present

## 2019-07-05 LAB — URINALYSIS, ROUTINE W REFLEX MICROSCOPIC
Bilirubin Urine: NEGATIVE
Glucose, UA: NEGATIVE mg/dL
Ketones, ur: NEGATIVE mg/dL
Leukocytes,Ua: NEGATIVE
Nitrite: POSITIVE — AB
Protein, ur: NEGATIVE mg/dL
Specific Gravity, Urine: <1.005 — ABNORMAL LOW (ref 1.005–1.030)
pH: 6.5 (ref 5.0–8.0)

## 2019-07-05 LAB — URINALYSIS, MICROSCOPIC (REFLEX)

## 2019-07-05 LAB — CBC
HCT: 42.1 % (ref 36.0–46.0)
Hemoglobin: 13.3 g/dL (ref 12.0–15.0)
MCH: 30 pg (ref 26.0–34.0)
MCHC: 31.6 g/dL (ref 30.0–36.0)
MCV: 95 fL (ref 80.0–100.0)
Platelets: 271 10*3/uL (ref 150–400)
RBC: 4.43 MIL/uL (ref 3.87–5.11)
RDW: 13.8 % (ref 11.5–15.5)
WBC: 6.6 10*3/uL (ref 4.0–10.5)
nRBC: 0 % (ref 0.0–0.2)

## 2019-07-05 LAB — BASIC METABOLIC PANEL
Anion gap: 10 (ref 5–15)
BUN: 9 mg/dL (ref 8–23)
CO2: 23 mmol/L (ref 22–32)
Calcium: 9.1 mg/dL (ref 8.9–10.3)
Chloride: 102 mmol/L (ref 98–111)
Creatinine, Ser: 0.8 mg/dL (ref 0.44–1.00)
GFR calc Af Amer: >60 mL/min (ref >60)
GFR calc non Af Amer: >60 mL/min (ref >60)
Glucose, Bld: 93 mg/dL (ref 70–99)
Potassium: 3.3 mmol/L — ABNORMAL LOW (ref 3.5–5.1)
Sodium: 135 mmol/L (ref 135–145)

## 2019-07-05 MED ORDER — ONDANSETRON HCL 4 MG/2ML IJ SOLN
4.0000 mg | Freq: Once | INTRAMUSCULAR | Status: AC
  Administered 2019-07-05: 4 mg via INTRAVENOUS
  Filled 2019-07-05: qty 2

## 2019-07-05 MED ORDER — SODIUM CHLORIDE 0.9 % IV BOLUS
1000.0000 mL | Freq: Once | INTRAVENOUS | Status: AC
  Administered 2019-07-05: 1000 mL via INTRAVENOUS

## 2019-07-05 MED ORDER — DICYCLOMINE HCL 10 MG PO CAPS
10.0000 mg | ORAL_CAPSULE | Freq: Once | ORAL | Status: AC
  Administered 2019-07-05: 10 mg via ORAL
  Filled 2019-07-05: qty 1

## 2019-07-05 MED ORDER — DICYCLOMINE HCL 20 MG PO TABS: 20.0000 mg | ORAL_TABLET | Freq: Three times a day (TID) | ORAL | 0 refills | Status: DC | PRN

## 2019-07-05 MED ORDER — ONDANSETRON 4 MG PO TBDP: 4.0000 mg | ORAL_TABLET | Freq: Three times a day (TID) | ORAL | 0 refills | Status: DC | PRN

## 2019-07-05 NOTE — ED Triage Notes (Signed)
Pt was dx with C-Diff yesterday by GI. Today pt having palpitations, dry mouth, HA, and back pain.

## 2019-07-05 NOTE — Discharge Instructions (Signed)
As we discussed, we believe her symptoms are caused today by mild volume depletion, or mild dehydration, without any evidence of damage to your body.  Please drink plenty of clear fluids such as water and/or Gatorade and follow up with your regular doctor or the doctors listed in his documentation at the next available opportunity.  Return to the emergency department with any new or worsening symptoms that concern you, including but not limited to fever, shortness of breath, chest pain, or other concerning symptoms. ° ° °

## 2019-07-05 NOTE — ED Provider Notes (Signed)
Emergency Department Provider Note   I have reviewed the triage vital signs and the nursing notes.   HISTORY  Chief Complaint No chief complaint on file.   HPI Molly Giles is a 63 y.o. female with prior history of C. Diff presents to the emergency department with heart palpitations and fatigue in the setting of return of C. difficile infection.  Patient was treated for a urinary tract infection with Macrobid and Augmentin.  She developed profuse, nonbloody diarrhea with abdominal cramping pain 4 days ago.  She sent a stool sample to her gastroenterologist and yesterday was called and told she had C. Difficile.  She was started on oral vancomycin which she has been taking for the last 24 hours and feels that her diarrhea symptoms are much improved.  She does have some intermittent abdominal cramping, back discomfort, dry mouth, weakness.  She has some heart palpitations without chest discomfort or shortness of breath.  No fevers or chills.  No severe, focal abdominal pain.   Past Medical History:  Diagnosis Date   Allergic rhinoconjunctivitis 07/02/2015   Arthritis    Benign positional vertigo 04/2015   Responded well to Vestibular Rehab   Bruxism (teeth grinding)    Chronic migraine 02/25/2016   Per Dr Jaynee Eagles review in notes from [facility] from September 2015. Showed total headache days last month 18. Severe headache days 7 days. Moderate headache days 5 days. Mild headache days last month sick days. Days without headache last month 10 days. Symptoms associated with photophobia, phonophobia, osmophobia, neck pain, dizziness, jaw pain, nasal congestion, vision disturbances, tingling and numbness, weakness and worsening with activity. Each headache attack last 3 hours depending on treatment in severity. Left side, the right side, easier side, the frontal area in the back of the head. Characterized as throbbing, pressure, tightness, squeezing, stabbing and burning      Chronic migraine w/o aura w/o status migrainosus, not intractable 07/14/2015   DDD (degenerative disc disease), cervical 11/18/2018   DDD (degenerative disc disease), lumbar    Degenerative cervical disc    Depression    Disc displacement, lumbar    Dysrhythmia    seen by dr Wynonia Lawman- not a problem since she has been on Bystolic   Essential hypertension 05/15/2013   Family history of adverse reaction to anesthesia    Brother- N/V   Family history of premature CAD 05/15/2013   Fibromyalgia    GERD (gastroesophageal reflux disease) 12/16/2014   H/O seasonal allergies    Hammer toe    Left great toe   Hearing loss of both ears 07/27/2015   mild to borderline moderate low frequency hearing loss improving to within normal limits bilaterally on audiology testing at [facility] in November 2016.     History of Clostridium difficile colitis 07/01/2015   Required Fecal Transplantation tocure   History of colonic polyps    History of irritable bowel syndrome 02/15/2016   History of migraine headaches 05/15/2013   Per Dr Jaynee Eagles review in notes from [facility] from September 2015. Showed total headache days last month 18. Severe headache days 7 days. Moderate headache days 5 days. Mild headache days last month sick days. Days without headache last month 10 days. Symptoms associated with photophobia, phonophobia, osmophobia, neck pain, dizziness, jaw pain, nasal congestion, vision disturbances, tingling and numbness, weakness and worsening with activity. Each headache attack last 3 hours depending on treatment in severity. Left side, the right side, easier side, the frontal  area in the back of the head. Characterized as throbbing, pressure, tightness, squeezing, stabbing and burning     Hx of bad fall 02/2015   Severe Facial/head trauma without fracture   Hyperlipidemia 1998   Hypertension 2004   Hypothyroidism    Impairment of balance 02/2015   Consequent of  postconcussive syndrome   Injury of triangular fibrocartilage complex of left wrist 02/25/2019   Dx 02/25/19 Iran Planas IV MD ([facility])   Interstitial cystitis    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[place]) 03/10/2018   [facility]   Lumbar facet joint pain    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   Migraine 1962   Chronic Migraines   Mood disorder ([place])    Musculoskeletal neck pain 07/14/2015   Normal coronary arteries 05/14/2014   Osteoarthritis of left hip 11/01/2015   MRI order by Dr Alvan Dame (ortho) 10/2015 showed significant arthritis of left hip joint with cystic changes in femoral head c/w osteoarthritis   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    Posterior vitreous detachment of right eye 2014   Shingles    Shortness of breath dyspnea    with exertion   Thyroid nodule 08/11/2009   Findings: The thyroid gland is within normal limits in size.  The gland is diffusely inhomogeneous. A small solid nodule is noted in the lower pole  medially on the right of 7 x 6 x 8 mm. A small solid nodule is noted inferiorly on the left of 3 x 3 x 4 mm.  IMPRESSION:  The thyroid gland is within normal limits in size with only small solid nodules present, the largest of only 8 mm in diameter on the right.     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    Vasomotor symptoms due to menopause 04/19/2017     Patient Active Problem List   Diagnosis Date Noted   Glycosuria 06/26/2019   Cough in adult 03/20/2019   Pain in joint, multiple sites 11/18/2018   DDD (degenerative disc disease), cervical 11/18/2018   DDD (degenerative disc disease), lumbar 11/18/2018   Positive ANA (antinuclear antibody) 11/18/2018   Sicca syndrome ([place]) 11/18/2018   History of colonic polyps 09/30/2018   Chronic contact dermatitis 08/06/2018   URI (upper respiratory infection) 08/14/2017   Fatigue 07/05/2017   Insulin resistance 07/04/2017   Vitamin D deficiency 05/08/2017   Class 1 obesity without serious comorbidity with body mass index (BMI) of 30.0 to 30.9 in adult 04/24/2017   Vasomotor symptoms due to menopause 04/19/2017   Periodic limb movement sleep disorder 03/28/2017   Nocturnal hypoxemia 03/06/2017   Morbid obesity ([place]) 03/06/2017   Snoring 03/06/2017   Gasping for breath 03/06/2017   Sleep walking and eating 03/06/2017   Episodic cluster headache, not intractable 03/06/2017   Obesity (BMI 30.0-34.9) 01/29/2017   Other insomnia 11/09/2016   S/P left THA, AA 07/25/2016   Irritable bowel syndrome with diarrhea 04/03/2016   Left ventricular  hypertrophy, mild 02/25/2016   Possible 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   Hearing loss of both ears 07/27/2015   Post concussive syndrome 07/14/2015   Depression (NOS) 07/02/2015   Allergic rhinoconjunctivitis 07/02/2015   Fibromyalgia syndrome 07/01/2015   History of Clostridium difficile colitis, persistent 07/01/2015   Impairment of balance 02/10/2015   Essential hypertension 05/15/2013   Hypothyroidism 05/15/2013   Hyperlipidemia 05/15/2013   Interstitial cystitis 05/15/2013   Chronic migraine without aura 05/15/2013   Spondylosis of lumbar region without myelopathy or radiculopathy 10/30/2011    Past Surgical History:  Procedure Laterality Date   Bladder dilitation     x 3   BREAST BIOPSY  Right 2011   Benign histology   CARDIOVASCULAR STRESS TEST  2000   Unremarkable per pt report   CARPOMETACARPAL JOINT ARTHROTOMY Right 2011   COLONOSCOPY     COLONOSCOPY WITH PROPOFOL N/A 04/21/2015   Procedure: COLONOSCOPY WITH PROPOFOL;  Surgeon: Carol Ada, MD;  Location: [place];  Service: Endoscopy;  Laterality: N/A;   EPIDURAL BLOCK INJECTION Left 04/12/2016   Left Medial Nerve Block and Left L5 ramus block, Dr Suella Broad    EPIDURAL BLOCK INJECTION  03/21/2016   Left L3-4 medial branch block and Left L5 & dorsal ramus block    EPIDURAL BLOCK INJECTION N/A 10/25/2016   Suella Broad, MD. Lumbar medial branch block   EPIDURAL BLOCK INJECTION N/A 02/09/2017   Suella Broad, MD.  Bilateral L3/4 medial branch block, bilateral L5 dorsal ramus block   EPIDURAL BLOCK INJECTION N/A 07/04/2017   Suella Broad, MD   FECAL TRANSPLANT  04/21/2015   Procedure: FECAL TRANSPLANT;  Surgeon: Carol Ada, MD;  Location: [facility] END[facility]COPY;  Service: Endoscopy;;   HIP ARTHROPLASTY Left    HIP ARTHROSCOPY Left 03/06/2018   Left hip arthroplasty, redo for loose hip arthroplasty. Procedure at [place] Left 11/2015   for OA by Dr Suella Broad   OTHER SURGICAL HISTORY Left 2016   Left L3/L4 medial nerve block and Left L5 Dorsal Ramus block Dr Mickel Duhamel   TOTAL HIP ARTHROPLASTY Left 07/25/2016   Procedure: LEFT TOTAL HIP ARTHROPLASTY ANTERIOR APPROACH;  Surgeon: Paralee Cancel, MD;  Location: [facility];  Service: Orthopedics;  Laterality: Left;    Allergies Sulfa antibiotics, Chlorhexidine gluconate, Codeine, Lyrica [pregabalin], Methylprednisolone, Prednisone, Betadine [povidone iodine], Latex, and Wellbutrin [bupropion]  Family History  Problem Relation Age of Onset   Alzheimer's disease Mother    Hyperlipidemia Mother    Hypertension Mother    Osteoporosis Mother    Parkinson's disease Mother    Migraines Sister    Allergies  Sister    Hypertension Sister    Hyperlipidemia Brother    Cardiomyopathy Brother    Diabetes type II Brother    Kidney disease Brother    Asthma Brother    Heart disease Brother    Hypertension Brother    Hyperlipidemia Brother    Kidney disease Brother    Heart disease Father    Hyperlipidemia Father    Hypertension Father    Aortic aneurysm Father    Early death Father 54   Diabetes type II Other    Breast cancer Maternal Aunt     Social History Social History   Tobacco Use   Smoking status: Passive Smoke Exposure - Never Smoker   Smokeless tobacco: Never Used   Tobacco comment: As a child  Substance Use Topics   Alcohol use: No  Alcohol/week: 0.0 standard drinks    Comment: No use in 30 years.   Drug use: No    Review of Systems  Constitutional: No fever/chills Eyes: No visual changes. ENT: No sore throat. Cardiovascular: Denies chest pain. Respiratory: Denies shortness of breath. Gastrointestinal: Positive mild abdominal pain. Positive nausea, no vomiting.  Improving diarrhea.  No constipation. Genitourinary: Negative for dysuria. Musculoskeletal: Positive mild back pain.  Skin: Negative for rash. Neurological: Negative for focal weakness or numbness. Positive HA.   10-point ROS otherwise negative.  ____________________________________________   PHYSICAL EXAM:  VITAL SIGNS: ED Triage Vitals  Enc Vitals Group     BP 07/05/19 1547 (!) 124/91     Pulse Rate 07/05/19 1547 (!) 114     Resp 07/05/19 1547 20     Temp 07/05/19 1547 98.9 F (37.2 C)     Temp Source 07/05/19 1547 Oral     SpO2 07/05/19 1547 94 %     Weight 07/05/19 1549 195 lb (88.5 kg)     Height 07/05/19 1549 5' 6.5" (1.689 m)   Constitutional: Alert and oriented. Well appearing and in no acute distress. Eyes: Conjunctivae are normal. Head: Atraumatic. Nose: No congestion/rhinnorhea. Mouth/Throat: Mucous membranes are dry.  Neck: No stridor.     Cardiovascular: Tachycardia. Good peripheral circulation. Grossly normal heart sounds.   Respiratory: Normal respiratory effort.  No retractions. Lungs CTAB. Gastrointestinal: Soft with very mild diffuse tenderness. No distention.  Musculoskeletal: No gross deformities of extremities. Neurologic:  Normal speech and language.  Skin:  Skin is warm, dry and intact. No rash noted.  ____________________________________________   LABS (all labs ordered are listed, but only abnormal results are displayed)  Labs Reviewed  BASIC METABOLIC PANEL - Abnormal; Notable for the following components:      Result Value   Potassium 3.3 (*)    All other components within normal limits  URINALYSIS, ROUTINE W REFLEX MICROSCOPIC - Abnormal; Notable for the following components:   Specific Gravity, Urine <1.005 (*)    Hgb urine dipstick TRACE (*)    Nitrite POSITIVE (*)    All other components within normal limits  URINALYSIS, MICROSCOPIC (REFLEX) - Abnormal; Notable for the following components:   Bacteria, UA RARE (*)    All other components within normal limits  URINE CULTURE  CBC   ____________________________________________  EKG   EKG Interpretation  Date/Time:  Saturday July 05 2019 15:51:01 EDT Ventricular Rate:  104 PR Interval:  144 QRS Duration: 80 QT Interval:  338 QTC Calculation: 444 R Axis:   1 Text Interpretation:  Sinus tachycardia Otherwise normal ECG No STEMI  Confirmed by Nanda Quinton [phone]) on 07/05/2019 3:58:30 PM      ____________________________________________   PROCEDURES  Procedure(s) performed:   Procedures  None ____________________________________________   INITIAL IMPRESSION / ASSESSMENT AND PLAN / ED COURSE  Pertinent labs & imaging results that were available during my care of the patient were reviewed by me and considered in my medical decision making (see chart for details).   Patient presents to the emergency department for evaluation  of dehydration type symptoms in the setting of recent C. difficile infection.  She is tolerating her oral vancomycin without vomiting.  Her diarrhea symptoms are improving on oral antibiotics.  I do not have focal abdominal tenderness, fever, other findings to suspect complicated C. difficile infection or surgical process in the abdomen.  Patient does have mild tachycardia here which is sinus on EKG.  No concern for acute arrhythmia.  Plan for IV fluids and supportive care. Will send UA and culture.   Patient feeling improved after IVF. Labs unremarkable. Plan for continued PO abx at home and GI follow up.  ____________________________________________  FINAL CLINICAL IMPRESSION(S) / ED DIAGNOSES  Final diagnoses:  Dehydration  C. difficile colitis     MEDICATIONS GIVEN DURING THIS VISIT:  Medications  sodium chloride 0.9 % bolus 1,000 mL (0 mLs Intravenous Stopped 07/05/19 1849)  ondansetron (ZOFRAN) injection 4 mg (4 mg Intravenous Given 07/05/19 1740)  dicyclomine (BENTYL) capsule 10 mg (10 mg Oral Given 07/05/19 1740)     Note:  This document was prepared using Dragon voice recognition software and may include unintentional dictation errors.  Nanda Quinton, MD, [facility]    Jini Horiuchi, [facility], MD 07/06/19 [phone]

## 2019-07-08 ENCOUNTER — Telehealth (HOSPITAL_COMMUNITY): Payer: Self-pay

## 2019-07-08 LAB — URINE CULTURE: Culture: 20000 — AB

## 2019-07-08 NOTE — Telephone Encounter (Signed)
Pt. Called for urine culture results.  Explained to pt. That I do not receive those cultures and once they are resulted that they will be given to our [facility] pharmacist and be reviewed by them . I explained to her she will be notified if she will need an antibiotic or a change in her antibiotic.  I was able to see that they have resulted today and she should be receiving a call if she needs an antibiotic.  She verbalized understanding and all questions answered.

## 2019-07-09 ENCOUNTER — Telehealth: Payer: Self-pay | Admitting: *Deleted

## 2019-07-09 NOTE — Telephone Encounter (Signed)
Post ED Visit - Positive Culture Follow-up  Culture report reviewed by antimicrobial stewardship pharmacist: [facility] Team []  Elenor Quinones, Pharm.D. []  Heide Guile, Pharm.D., BCPS AQ-ID []  Parks Neptune, Pharm.D., BCPS []  Alycia Rossetti, Pharm.D., BCPS []  [place], Pharm.D., BCPS, AAHIVP []  Legrand Como, Pharm.D., BCPS, AAHIVP [x]  Salome Arnt, PharmD, BCPS []  Johnnette Gourd, PharmD, BCPS []  Hughes Better, PharmD, BCPS []  Leeroy Cha, PharmD []  Laqueta Linden, PharmD, BCPS []  Albertina Parr, PharmD  [facility] Team []  Leodis Sias, PharmD []  Lindell Spar, PharmD []  Royetta Asal, PharmD []  Graylin Shiver, Rph []  Rema Fendt) Glennon Mac, PharmD []  Arlyn Dunning, PharmD []  Netta Cedars, PharmD []  Dia Sitter, PharmD []  Leone Haven, PharmD []  Gretta Arab, PharmD []  Theodis Shove, PharmD []  Peggyann Juba, PharmD []  Reuel Boom, PharmD   Positive urine culture Started PO Vancomycin one day prior and no further patient follow-up is required at this time.  Harlon Flor [facility] 07/09/2019, 11:48 AM

## 2019-07-11 ENCOUNTER — Telehealth: Payer: Self-pay | Admitting: Rheumatology

## 2019-07-11 DIAGNOSIS — M797 Fibromyalgia: Secondary | ICD-10-CM

## 2019-07-11 DIAGNOSIS — B3731 Acute candidiasis of vulva and vagina: Secondary | ICD-10-CM

## 2019-07-11 DIAGNOSIS — B373 Candidiasis of vulva and vagina: Secondary | ICD-10-CM

## 2019-07-11 HISTORY — DX: Acute candidiasis of vulva and vagina: B37.31

## 2019-07-11 HISTORY — DX: Candidiasis of vulva and vagina: B37.3

## 2019-07-11 MED ORDER — ZOLPIDEM TARTRATE 10 MG PO TABS: 10.0000 mg | ORAL_TABLET | Freq: Every evening | ORAL | 0 refills | Status: DC | PRN

## 2019-07-11 NOTE — Telephone Encounter (Signed)
Last Visit: 02/04/19 Next Visit: 08/04/19  Okay to refill Ambien?  

## 2019-07-11 NOTE — Telephone Encounter (Signed)
Patient called requesting prescription refill of Zolpidem to be sent to [facility] at [address]  Patient states she is completely out of pills and requesting the prescription be sent today.

## 2019-07-21 NOTE — Progress Notes (Deleted)
Office Visit Note  Patient: Molly Giles             Date of Birth: 19-Oct-1955           MRN: [id number]             PCP: McDiarmid, Blane Ohara, MD Referring: McDiarmid, Blane Ohara, MD Visit Date: 08/04/2019 Occupation: @GUAROCC @  Subjective:  No chief complaint on file.   History of Present Illness: Molly Giles is a 63 y.o. female ***   Activities of Daily Living:  Patient reports morning stiffness for *** {minute/hour:19697}.   Patient {ACTIONS;DENIES/REPORTS:21021675::"Denies"} nocturnal pain.  Difficulty dressing/grooming: {ACTIONS;DENIES/REPORTS:21021675::"Denies"} Difficulty climbing stairs: {ACTIONS;DENIES/REPORTS:21021675::"Denies"} Difficulty getting out of chair: {ACTIONS;DENIES/REPORTS:21021675::"Denies"} Difficulty using hands for taps, buttons, cutlery, and/or writing: {ACTIONS;DENIES/REPORTS:21021675::"Denies"}  No Rheumatology ROS completed.   PMFS History:  Patient Active Problem List   Diagnosis Date Noted  . Glycosuria 06/26/2019  . Cough in adult 03/20/2019  . Pain in joint, multiple sites 11/18/2018  . DDD (degenerative disc disease), cervical 11/18/2018  . DDD (degenerative disc disease), lumbar 11/18/2018  . Positive ANA (antinuclear antibody) 11/18/2018  . Sicca syndrome ([place]) 11/18/2018  . History of colonic polyps 09/30/2018  . Chronic contact dermatitis 08/06/2018  . URI (upper respiratory infection) 08/14/2017  . Fatigue 07/05/2017  . Insulin resistance 07/04/2017  . Vitamin D deficiency 05/08/2017  . Class 1 obesity without serious comorbidity with body mass index (BMI) of 30.0 to 30.9 in adult 04/24/2017  . Vasomotor symptoms due to menopause 04/19/2017  . Periodic limb movement sleep disorder 03/28/2017  . Nocturnal hypoxemia 03/06/2017  . Morbid obesity ([place]) 03/06/2017  . Snoring 03/06/2017  . Gasping for breath 03/06/2017  . Sleep walking and eating 03/06/2017  . Episodic cluster headache, not intractable 03/06/2017  . Obesity  (BMI 30.0-34.9) 01/29/2017  . Other insomnia 11/09/2016  . S/P left THA, AA 07/25/2016  . Irritable bowel syndrome with diarrhea 04/03/2016  . Left ventricular hypertrophy, mild 02/25/2016  . 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 Hearing loss of both ears 07/27/2015  . Post concussive syndrome 07/14/2015  . Depression (NOS) 07/02/2015  . Allergic rhinoconjunctivitis 07/02/2015  . Fibromyalgia syndrome 07/01/2015  . History of Clostridium difficile colitis, persistent 07/01/2015  . Impairment of balance 02/10/2015  . Essential hypertension 05/15/2013  . Hypothyroidism 05/15/2013  . Hyperlipidemia 05/15/2013  . Interstitial cystitis 05/15/2013  . Chronic migraine without aura 05/15/2013  . Spondylosis of lumbar region without myelopathy or radiculopathy 10/30/2011    Past Medical History:  Diagnosis Date  . Allergic rhinoconjunctivitis 07/02/2015  . Arthritis   . Benign positional vertigo 04/2015   Responded well to [facility] Rehab  . Bruxism (teeth grinding)   . Chronic migraine 02/25/2016   Per Dr Jaynee Eagles review in notes from [facility] from September 2015. Showed total headache days last month 18. Severe headache days 7 days. Moderate headache days 5 days. Mild headache days last month sick days. Days without headache last month 10 days. Symptoms associated with photophobia, phonophobia, osmophobia, neck pain, dizziness, jaw pain, nasal congestion, vision disturbances, tingling and numbness, weakness and worsening with activity. Each headache attack last 3 hours depending on treatment in severity. Left side, the right side, easier side, the frontal area in the back of the head. Characterized as throbbing, pressure, tightness, squeezing, stabbing and burning   . Chronic migraine w/o aura w/o status migrainosus, not intractable 07/14/2015  . DDD (degenerative disc disease), cervical 11/18/2018  . DDD (degenerative disc  disease), lumbar   . Degenerative  cervical disc   . Depression   . Disc displacement, lumbar   . Dysrhythmia    seen by dr Wynonia Lawman- not a problem since she has been on Bystolic  . Essential hypertension 05/15/2013  . Family history of adverse reaction to anesthesia    Brother- N/V  . Family history of premature CAD 05/15/2013  . Fibromyalgia   . GERD (gastroesophageal reflux disease) 12/16/2014  . H/O seasonal allergies   . Hammer toe    Left great toe  . Hearing loss of both ears 07/27/2015   mild to borderline moderate low frequency hearing loss improving to within normal limits bilaterally on audiology testing at [facility] in November 2016.    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 History of colonic polyps   . History of irritable bowel syndrome 02/15/2016  . History of migraine headaches 05/15/2013   Per Dr Jaynee Eagles review in notes from [facility] from September 2015. Showed total headache days last month 18. Severe headache days 7 days. Moderate headache days 5 days. Mild headache days last month sick days. Days without headache last month 10 days. Symptoms associated with photophobia, phonophobia, osmophobia, neck pain, dizziness, jaw pain, nasal congestion, vision disturbances, tingling and numbness, weakness and worsening with activity. Each headache attack last 3 hours depending on treatment in severity. Left side, the right side, easier side, the frontal area in the back of the head. Characterized as throbbing, pressure, tightness, squeezing, stabbing and burning    . Hx of bad fall 02/2015   Severe Facial/head trauma without fracture  . Hyperlipidemia 1998  . Hypertension 2004  . Hypothyroidism   . Impairment of balance 02/2015   Consequent of postconcussive syndrome  . Injury of triangular fibrocartilage complex of left wrist 02/25/2019   Dx 02/25/19 Iran Planas IV MD [facility])  . Interstitial cystitis   . Loose total hip  arthroplasty ([place]) 03/10/2018   [facility]  . Lumbar facet joint pain   . 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 Migraine 1962   Chronic Migraines  . Mood disorder ([place])   . Musculoskeletal neck pain 07/14/2015  . Normal coronary arteries 05/14/2014  . Osteoarthritis of left hip 11/01/2015   MRI order by Dr Alvan Dame (ortho) 10/2015 showed significant arthritis of left hip joint with cystic changes in femoral head c/w osteoarthritis  . 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 Overweight   . Pain in joint of left shoulder 11/09/2016  . Palpitations 05/15/2013   [facility] manages  . Periodic limb movement sleep disorder 03/28/2017  . Perirectal cyst 05/07/2016  . PONV (postoperative nausea and vomiting)   . Post concussion syndrome 06/06/2015  . Post concussive syndrome 07/14/2015   Ms Kosch's post-concussive syndrome manifesting in vertigo and headache, mood changes, poor balance, dizziness, and decreased concentration per Dr Jaynee Eagles at [facility].   Marland Kitchen Posterior vitreous detachment of right eye 2014  . Shingles   . Shortness of breath dyspnea    with exertion  . Thyroid nodule 08/11/2009   Findings: The thyroid gland is within normal limits in size.  The gland is diffusely inhomogeneous. A small solid nodule is noted in the lower pole  medially on the right of 7 x 6 x 8 mm. A small solid nodule is noted inferiorly on the left of 3 x 3 x 4 mm.  IMPRESSION:  The thyroid gland is within normal limits in size with only small solid nodules present, the largest of only 8 mm in diameter on the right.    . 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   . Vasomotor symptoms due to menopause 04/19/2017    Family History  Problem Relation Age of Onset  . Alzheimer's disease Mother   . Hyperlipidemia Mother   . Hypertension Mother   . Osteoporosis Mother   . Parkinson's disease Mother   .  Migraines Sister   . Allergies Sister   . Hypertension Sister   . Hyperlipidemia Brother   . Cardiomyopathy Brother   . Diabetes type II Brother   . Kidney disease Brother   . Asthma Brother   . Heart disease Brother   . Hypertension Brother   . Hyperlipidemia Brother   . Kidney disease Brother   . Heart disease Father   . Hyperlipidemia Father   . Hypertension Father   . Aortic aneurysm Father   . Early death Father 62  . Diabetes type II Other   . Breast cancer Maternal Aunt    Past Surgical History:  Procedure Laterality Date  . Bladder dilitation     x 3  . BREAST BIOPSY Right 2011   Benign histology  . CARDIOVASCULAR STRESS TEST  2000   Unremarkable per pt report  . CARPOMETACARPAL JOINT ARTHROTOMY Right 2011  . COLONOSCOPY    . COLONOSCOPY WITH PROPOFOL N/A 04/21/2015   Procedure: COLONOSCOPY WITH PROPOFOL;  Surgeon: Carol Ada, MD;  Location: [place];  Service: Endoscopy;  Laterality: N/A;  . EPIDURAL BLOCK INJECTION Left 04/12/2016   Left Medial Nerve Block and Left L5 ramus block, Dr Suella Broad   . EPIDURAL BLOCK INJECTION  03/21/2016   Left L3-4 medial branch block and Left L5 & dorsal ramus block   . EPIDURAL BLOCK INJECTION N/A 10/25/2016   Suella Broad, MD. Lumbar medial branch block  . EPIDURAL BLOCK INJECTION N/A 02/09/2017   Suella Broad, MD.  Bilateral L3/4 medial branch block, bilateral L5 dorsal ramus block  . EPIDURAL BLOCK INJECTION N/A 07/04/2017   Suella Broad, MD  . FECAL TRANSPLANT  04/21/2015   Procedure: FECAL TRANSPLANT;  Surgeon: Carol Ada, MD;  Location: [place];  Service: Endoscopy;;  . HIP ARTHROPLASTY Left   . HIP ARTHROSCOPY Left 03/06/2018   Left hip arthroplasty, redo for loose hip arthroplasty. Procedure at [facility] hospital  . INJECTION HIP INTRA ARTICULAR Left 11/2015   for OA by Dr Suella Broad  . OTHER SURGICAL HISTORY Left 2016   Left L3/L4 medial nerve block and Left L5 Dorsal Ramus block Dr Mickel Duhamel   . TOTAL HIP ARTHROPLASTY Left 07/25/2016   Procedure: LEFT TOTAL HIP ARTHROPLASTY ANTERIOR APPROACH;  Surgeon: Paralee Cancel, MD;  Location: [facility];  Service: Orthopedics;  Laterality: Left;   Social History   Social History Narrative   Prior PCP With [facility] at [place], [place].   Married, lives with [place], (b. 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    Wears seatbelt usually   No religious beliefs affecting healthcare   No difficulty taking medications as directed.       Home has working smoke alarm   No home throw rugs   Does not have nonslip bathtub / shower    Has railings on  all stairs   Home is free from [place].    Right-handed.      No history of Hospitalization as of 07/2016.       Best number to reach patient [phone] ([phone]) as of 07/01/15   It is permissible to leave messages as of 07/01/15 .    No regular exercise of 3 times a week for 30 minutes at a time      Tulani Kouba has Advanced Directive and a Doctor, general practice is husband, Shambra Jarchow [phone]      Caffeine: 2-3 cups coffee per day   Immunization History  Administered Date(s) Administered  . Influenza,inj,Quad PF,6+ Mos 07/01/2015, 05/10/2017, 06/26/2019  . Influenza-Unspecified 09/13/2016, 08/12/2018  . Tdap 11/09/2011, 02/24/2016, 09/13/2016  . Zoster Recombinat (Shingrix) 08/12/2018     Objective: Vital Signs: There were no vitals taken for this visit.   Physical Exam   Musculoskeletal Exam: ***  CDAI Exam: CDAI Score: - Patient Global: -; Provider Global: - Swollen: -; Tender: - Joint Exam   No joint exam has been documented for this visit   There is currently no information documented on the homunculus. Go to the Rheumatology activity and complete the homunculus joint exam.  Investigation: No additional findings.  Imaging: No results found.  Recent Labs: Lab Results   Component Value Date   WBC 6.6 07/05/2019   HGB 13.3 07/05/2019   PLT 271 07/05/2019   NA 135 07/05/2019   K 3.3 (L) 07/05/2019   CL 102 07/05/2019   CO2 23 07/05/2019   GLUCOSE 93 07/05/2019   BUN 9 07/05/2019   CREATININE 0.80 07/05/2019   BILITOT 0.2 05/07/2018   ALKPHOS 79 05/07/2018   AST 16 05/07/2018   ALT 15 05/07/2018   PROT 6.7 05/07/2018   ALBUMIN 4.2 05/07/2018   CALCIUM 9.1 07/05/2019   GFRAA >60 07/05/2019    Speciality Comments: No specialty comments available.  Procedures:  No procedures performed Allergies: Sulfa antibiotics, Chlorhexidine gluconate, Codeine, Lyrica [pregabalin], Methylprednisolone, Prednisone, Betadine [povidone iodine], Latex, and Wellbutrin [bupropion]   Assessment / Plan:     Visit Diagnoses: No diagnosis found.  Orders: No orders of the defined types were placed in this encounter.  No orders of the defined types were placed in this encounter.   Face-to-face time spent with patient was *** minutes. Greater than 50% of time was spent in counseling and coordination of care.  Follow-Up Instructions: No follow-ups on file.   Earnestine Mealing, CMA  Note - This record has been created using Editor, commissioning.  Chart creation errors have been sought, but may not always  have been located. Such creation errors do not reflect on  the standard of medical care.

## 2019-07-23 ENCOUNTER — Encounter: Payer: Self-pay | Admitting: Family Medicine

## 2019-07-23 NOTE — Progress Notes (Signed)
Summary televisit note from [facility] 07/11/19 CC: Diarrhea and Vomiting with recent Dx of C diff infection Return for fu C diff diarrhea.  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 Taking Vanc 125 mg QID  7 / 14 d.  Decreased diarrhea.  Taking Bentyl for IBS.  ED visit over wknd for dehydration that responded to IVFs and potassium repletion.  Precipitatnt for C diff infection was courses of Amox and  Augmentin for UTI ~ one month ago.  Lower tract dymptoms persist - pt to see her Urologist.  Taking AZO OTC.  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   Plan Continue Florastor fir 2-3 months.  Take Florastor if take Abx.  Finish 14 days toto Vanc po. If recurs or persists, plan Vanc taper p Rx Terconazole Crm for yeast vaginitis.  F/U with Urologist

## 2019-07-24 ENCOUNTER — Ambulatory Visit (INDEPENDENT_AMBULATORY_CARE_PROVIDER_SITE_OTHER): Payer: BC Managed Care – PPO | Admitting: Family Medicine

## 2019-07-24 ENCOUNTER — Other Ambulatory Visit: Payer: Self-pay

## 2019-07-24 ENCOUNTER — Encounter: Payer: Self-pay | Admitting: Family Medicine

## 2019-07-24 VITALS — BP 124/78 | HR 104

## 2019-07-24 DIAGNOSIS — L74519 Primary focal hyperhidrosis, unspecified: Secondary | ICD-10-CM

## 2019-07-24 DIAGNOSIS — E876 Hypokalemia: Secondary | ICD-10-CM | POA: Diagnosis not present

## 2019-07-24 DIAGNOSIS — G4761 Periodic limb movement disorder: Secondary | ICD-10-CM | POA: Diagnosis not present

## 2019-07-24 DIAGNOSIS — E038 Other specified hypothyroidism: Secondary | ICD-10-CM

## 2019-07-24 NOTE — Assessment & Plan Note (Signed)
Diagnosed by Sleep Study 2019

## 2019-07-25 ENCOUNTER — Other Ambulatory Visit: Payer: Self-pay | Admitting: Family Medicine

## 2019-07-25 ENCOUNTER — Encounter: Payer: Self-pay | Admitting: Family Medicine

## 2019-07-25 DIAGNOSIS — E038 Other specified hypothyroidism: Secondary | ICD-10-CM

## 2019-07-25 DIAGNOSIS — L74519 Primary focal hyperhidrosis, unspecified: Secondary | ICD-10-CM | POA: Insufficient documentation

## 2019-07-25 HISTORY — DX: Primary focal hyperhidrosis, unspecified: L74.519

## 2019-07-25 LAB — TSH: TSH: 2.79 u[IU]/mL (ref 0.450–4.500)

## 2019-07-25 LAB — BASIC METABOLIC PANEL
BUN/Creatinine Ratio: 18 (ref 12–28)
BUN: 17 mg/dL (ref 8–27)
CO2: 21 mmol/L (ref 20–29)
Calcium: 9.8 mg/dL (ref 8.7–10.3)
Chloride: 103 mmol/L (ref 96–106)
Creatinine, Ser: 0.93 mg/dL (ref 0.57–1.00)
GFR calc Af Amer: 75 mL/min/{1.73_m2} (ref >59)
GFR calc non Af Amer: 65 mL/min/{1.73_m2} (ref >59)
Glucose: 78 mg/dL (ref 65–99)
Potassium: 4.6 mmol/L (ref 3.5–5.2)
Sodium: 140 mmol/L (ref 134–144)

## 2019-07-25 MED ORDER — LEVOTHYROXINE SODIUM 125 MCG PO TABS: 62.5000 ug | ORAL_TABLET | Freq: Every day | ORAL | 1 refills | Status: DC

## 2019-07-28 ENCOUNTER — Telehealth: Payer: Self-pay | Admitting: Family Medicine

## 2019-07-28 ENCOUNTER — Encounter: Payer: Self-pay | Admitting: Family Medicine

## 2019-07-28 DIAGNOSIS — E876 Hypokalemia: Secondary | ICD-10-CM | POA: Insufficient documentation

## 2019-07-28 HISTORY — DX: Hypokalemia: E87.6

## 2019-07-28 NOTE — Assessment & Plan Note (Signed)
Lab Results  Component Value Date   TSH 2.790 07/24/2019  Adequate LT4 dose 0.125 mg tablet, half-tablet (0.625 mg) daily

## 2019-07-28 NOTE — Assessment & Plan Note (Signed)
Basic Metabolic Panel:    Component Value Date/Time   NA 140 07/24/2019 1116   K 4.6 07/24/2019 1116   Resolved issue

## 2019-07-28 NOTE — Progress Notes (Signed)
   Subjective:    Patient ID: Molly Giles, female    DOB: [birth year], 63 y.o.   MRN: [id number] [name] is alone Sources of clinical information for visit is/are patient, past medical records and summary ov note [facility] 07/11/19 and ED visit discharge summary 07/05/19.. Nursing assessment for this office visit was reviewed with the patient for accuracy and revision.   Previous Report(s) Reviewed: ER records, historical medical records and office notes  Depression screen PHQ 2/9 07/24/2019  Decreased Interest 1  Down, Depressed, Hopeless 1  PHQ - 2 Score 2  Altered sleeping -  Tired, decreased energy -  Change in appetite -  Feeling bad or failure about yourself  -  Trouble concentrating -  Moving slowly or fidgety/restless -  Suicidal thoughts -  PHQ-9 Score -  Difficult doing work/chores -  Some recent data might be hidden    Fall Risk  08/14/2017 07/02/2015 07/02/2015 04/08/2015 01/29/2015  Falls in the past year? No - No No No  Risk for fall due to : - Impaired balance/gait History of fall(s) - -    Adult vaccines due  Topic Date Due  . TETANUS/TDAP  09/13/2026    Health Maintenance Due  Topic Date Due  . PAP SMEAR-Modifier  09/11/2017     @10RELATIVEDAYS @  Is pregnancy a possibility?: no No LMP recorded. Patient is postmenopausal.   Is patient Breastfeeding?  no    History/P.E. limitations: none  Adult vaccines due  Topic Date Due  . TETANUS/TDAP  09/13/2026   There are no preventive care reminders to display for this patient.  Health Maintenance Due  Topic Date Due  . PAP SMEAR-Modifier  09/11/2017     Chief Complaint  Patient presents with  . Thyroid Problem    check tsh and potassium     HPI  Hypothyroidism Longstanding issue for patient Patient presents for evaluation of thyroid function.  Symptoms consist of denies fatigue, weight changes, heat/cold intolerance, bowel/skin changes or CVS symptoms.  The problem has been  unchanged.   Previous thyroid studies include TSH.  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  Hypokalemia - Dx 07/05/19 EDvisit with n/v/d and dehydration due to C diff infection - completed 14 day course vanc with good results - BMET with K+ 3.3 - Do not see supplemental potassium given in ED  - No further n/v/d  Scalp sweating - duration months to years - precipitant: showering - No flushing. - Did not resolve with HRT tx vasomotor symptoms - No weight loss, no fever, no rigors - No excess sweating other body parts.    SH: no smoking  Review of Systems No cough No  diarrhea    Objective:   Physical Exam Physical Exam VS: reviewed GEN: alert, cooperative and no distress COR: Heart sounds are normal.  Regular rate and rhythm without murmur, gallop or rub. LUNG: clear to auscultation bilaterally, no increased WOB ABD: normal appearance, normal bowel sounds, nontender to palpation,, no palpable masses, No HSM EXT: No C/C/E NEURO: alert, oriented, normal speech, no focal findings or movement disorder noted PSYCH: Good insight/prosodic and clear speech/Language concrete and relevant/affect euthymic         Assessment & Plan:

## 2019-07-28 NOTE — Assessment & Plan Note (Signed)
New problem Normal TSH No medication on Molly Giles's list know to cause sweating No constitutional symptoms to support chronic infection nor malignancy  Will recommend localized aluminum chloride topical OTC therapy for use on scalp. If does not improve, may try topical glycopyrrol or systemic oxybutinin.

## 2019-07-28 NOTE — Telephone Encounter (Signed)
Reviewed TSH result. Recommended continuing LT4 0.625 mg daily.  Discussed trial of Aluminom chloride topical formation designed to be applied to the face for localized hyperhidrosis of scalp.

## 2019-08-04 ENCOUNTER — Ambulatory Visit: Payer: BLUE CROSS/BLUE SHIELD | Admitting: Physician Assistant

## 2019-08-12 ENCOUNTER — Telehealth: Payer: Self-pay | Admitting: Family Medicine

## 2019-08-12 NOTE — Telephone Encounter (Signed)
Pt is calling and would like a referral to either see a cardiologist to have a heart scan or just have the doctor order a heart scan. She knows that she will need to speak with the Dr. McDiarmid or one of his nurses to start this process.

## 2019-08-12 NOTE — Telephone Encounter (Signed)
Spoke with patient and she states that she is concerned due to a strong family history of heart disease and would like to be checked. Patient states that her father passed in his 85s from a heart attack and most of her siblings have had "calcium scans" as well as bypasses performed.  Will forward to MD to advise.  Patient is agreeable to seeing someone at [facility] but would like a recommendation from Dr. Wendy Poet.  Molly Giles,CMA

## 2019-08-13 NOTE — Telephone Encounter (Signed)
Please help her schedule in person or virtual visit with Dr. Wendy Poet.

## 2019-08-13 NOTE — Telephone Encounter (Signed)
mychart visit made for 09/01/2019. Molly Giles,CMA

## 2019-08-19 ENCOUNTER — Other Ambulatory Visit: Payer: Self-pay | Admitting: Rheumatology

## 2019-08-19 DIAGNOSIS — M797 Fibromyalgia: Secondary | ICD-10-CM

## 2019-08-20 NOTE — Telephone Encounter (Signed)
Last Visit: 02/04/2019 Next Visit: 09/22/2019  Last fill: 07/11/2019  Okay to refill Lorrin Mais?

## 2019-09-01 ENCOUNTER — Telehealth (INDEPENDENT_AMBULATORY_CARE_PROVIDER_SITE_OTHER): Payer: BC Managed Care – PPO | Admitting: Family Medicine

## 2019-09-01 ENCOUNTER — Other Ambulatory Visit: Payer: Self-pay

## 2019-09-01 DIAGNOSIS — Z8249 Family history of ischemic heart disease and other diseases of the circulatory system: Secondary | ICD-10-CM

## 2019-09-01 DIAGNOSIS — E7800 Pure hypercholesterolemia, unspecified: Secondary | ICD-10-CM | POA: Diagnosis not present

## 2019-09-01 DIAGNOSIS — I10 Essential (primary) hypertension: Secondary | ICD-10-CM

## 2019-09-01 DIAGNOSIS — R1310 Dysphagia, unspecified: Secondary | ICD-10-CM | POA: Diagnosis not present

## 2019-09-02 ENCOUNTER — Encounter: Payer: Self-pay | Admitting: Family Medicine

## 2019-09-02 NOTE — Progress Notes (Signed)
[place] / E- Visit   This visit type was conducted due to national recommendations for restrictions regarding the COVID-19 Pandemic (e.g. social distancing) in an effort to limit this patient's exposure and mitigate transmission in our community.      Encounter participants: Patient: Molly Giles  Patient Location: Other:  car Provider: Sherren Mocha Kasyn Stouffer at office  Others (if applicable): no   ====================================================================================================================================================================================================================================================================================================  Chief Complaint: concern for coronary disease  HPI:  Concern for Coronary artery Disease Mrs Hoogland's sister was recently found tohave abnormal scan that found calcium/ plaques in her heart.  Presumably this was a Coronary CT scan. The sister's PCP physician recommended reduction in cardiac risk factors, with no further cardiac work-up for now.  Ms Bodley's sister recommended she also have a coronary CT.    Mrs. Blick has premature coronary disease in multiple first degree relatives including her father with a CABG age 54, a brothers with CABG in his 87s. A brother with what sounds like ACS who had a CABG, and then the sister described above.   No chest pain, no DOE  PMH: HTN, HLP, postmenopause  Pain with swallowing - Pain in chest with swallow some foods, like bread. - No dysphagia - No hisotry of GERD or esophageal disorders - Not taking PPI or H2B     ROS: see HPI    Exam:  Respiratory: speaking in full sentence, no audible wheeze  Assessment/Plan:  Screening for CAD - Mrs Seiber is at intermediate risk of some degree of coronary atherosclerosis from postmenopausal status, hypertension and hyperlipidemia. . - Currently asymptomatic for typical CAD  symptoms - Pt requesting further CAD stratification - Plan: Referral to Dr Johnsie Cancel ([place]) for evaluation of patient's concern  Odynophagia - No red flags  - Trail of OTC H2B for 4 weeks - If not improved after trial, then referral to patient's gastroenterologist.      Time spent on phone with patient: 9 minutes

## 2019-09-08 ENCOUNTER — Encounter: Payer: Self-pay | Admitting: Family Medicine

## 2019-09-09 ENCOUNTER — Ambulatory Visit: Payer: BC Managed Care – PPO | Attending: Internal Medicine

## 2019-09-09 DIAGNOSIS — R238 Other skin changes: Secondary | ICD-10-CM

## 2019-09-09 DIAGNOSIS — U071 COVID-19: Secondary | ICD-10-CM

## 2019-09-10 LAB — NOVEL CORONAVIRUS, NAA: SARS-CoV-2, NAA: NOT DETECTED

## 2019-09-11 ENCOUNTER — Telehealth: Payer: Self-pay

## 2019-09-11 DIAGNOSIS — F3289 Other specified depressive episodes: Secondary | ICD-10-CM

## 2019-09-11 MED ORDER — CITALOPRAM HYDROBROMIDE 20 MG PO TABS: 20.0000 mg | ORAL_TABLET | Freq: Every day | ORAL | 0 refills | Status: DC

## 2019-09-11 NOTE — Telephone Encounter (Signed)
Refill request received via fax from [facility] on [address] for citalopram.   Last Visit: 02/04/2019 Next Visit: 09/22/2019  Okay to refill per Dr. Estanislado Pandy.

## 2019-09-22 ENCOUNTER — Ambulatory Visit: Payer: BC Managed Care – PPO | Admitting: Physician Assistant

## 2019-10-06 ENCOUNTER — Other Ambulatory Visit: Payer: Self-pay | Admitting: *Deleted

## 2019-10-06 DIAGNOSIS — M797 Fibromyalgia: Secondary | ICD-10-CM

## 2019-10-06 MED ORDER — ZOLPIDEM TARTRATE 10 MG PO TABS: ORAL_TABLET | ORAL | 0 refills | Status: DC

## 2019-10-06 NOTE — Telephone Encounter (Signed)
Refill request received via fax  Last Visit: 02/04/2019 Next Visit: 09/22/2019  Last Fill: 08/20/19  Okay to refill Ambien?

## 2019-10-13 NOTE — Progress Notes (Signed)
CARDIOLOGY CONSULT NOTE       Patient ID: Molly Giles MRN: [id number] DOB/AGE: 03-25-1956 64 y.o.  Admit date: (Not on file) Referring Physician: McDiamird Primary Physician: McDiarmid, Blane Ohara, MD Primary Cardiologist: New  Reason for Consultation: Risk of CAD  Active Problems:   * No active hospital problems. *   HPI:  64 y.o. referred by DR McDiamird for family history of premature CAD Father and brother with CABG in 60's Sister recently had what sounds like a calcium score with "plaque" in her arteries CRF;s otherwise include HLD on statin. She is also on synthroid replacement Last LDL in epic 02/24/19 was 187 It appears that crestor 10 mg was started 06/13/19  She denies chest pain , dyspnea has some palpitations and HR a bit high in office. She actually is my neighbor living a few houses down the street.   ROS All other systems reviewed and negative except as noted above  Past Medical History:  Diagnosis Date  . Allergic rhinoconjunctivitis 07/02/2015  . Arthritis   . Benign positional vertigo 04/2015   Responded well to [facility]  . Bruxism (teeth grinding)   . Chronic contact dermatitis 08/06/2018   Per allergist Dr Pablo Lawrence.   . Chronic migraine 02/25/2016   Per Dr Jaynee Eagles review in notes [facility] from September 2015. Showed total headache days last month 18. Severe headache days 7 days. Moderate headache days 5 days. Mild headache days last month sick days. Days without headache last month 10 days. Symptoms associated with photophobia, phonophobia, osmophobia, neck pain, dizziness, jaw pain, nasal congestion, vision disturbances, tingling and numbness, weakness and worsening with activity. Each headache attack last 3 hours depending on treatment in severity. Left side, the right side, easier side, the frontal area in the back of the head. Characterized as throbbing, pressure, tightness, squeezing, stabbing and burning   . Chronic  migraine without aura 05/15/2013    Dr Jaynee Eagles [facility]  . DDD (degenerative disc disease), cervical 11/18/2018  . DDD (degenerative disc disease), lumbar   . Depression   . Disc displacement, lumbar   . Dysrhythmia    seen by dr Wynonia Lawman- not a problem since she has been on Bystolic  . Episodic cluster headache, not intractable 03/06/2017  . Essential hypertension 05/15/2013  . Family history of adverse reaction to anesthesia    Brother- N/V  . Family history of premature CAD 05/15/2013  . Fibromyalgia syndrome 07/01/2015   Management by Dr Chauncey Cruel. Devashwar ([facility])   . GERD (gastroesophageal reflux disease) 12/16/2014  . H/O seasonal allergies   . Hammer toe    Left great toe  . Hearing loss of both ears 07/27/2015   mild to borderline moderate low frequency hearing loss improving to within normal limits bilaterally on audiology testing at [facility] in November 2016.    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 History of colonic polyps   . Hx of bad fall 02/2015   Severe Facial/head trauma without fracture  . 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 Hyperlipidemia 1998  . Hypothyroidism   . Impairment of balance 02/2015   Consequent of postconcussive syndrome  . Injury of triangular fibrocartilage complex of left wrist 02/25/2019   Dx 02/25/19 Iran Planas IV MD [facility])  . Insulin resistance 07/04/2017  . Interstitial cystitis   . Irritable bowel syndrome with diarrhea 04/03/2016  . Left ventricular hypertrophy, mild 02/25/2016  ECHOcardiogram report 06/17/15 showing EF55-60%, mild LVH and G1DD   . 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[place]) 03/10/2018   [facility]  . Lumbar facet joint pain   . 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 Mood disorder ([place])   . Morbid obesity ([place]) 03/06/2017  . Musculoskeletal neck pain 07/14/2015  . Nocturnal hypoxemia 03/06/2017  . Normal coronary arteries 05/14/2014  .  Obesity (BMI 30.0-34.9) 01/29/2017  . Osteoarthritis of left hip 11/01/2015   MRI order by Dr Alvan Dame (ortho) 10/2015 showed significant arthritis of left hip joint with cystic changes in femoral head c/w osteoarthritis  . 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 Other insomnia 11/09/2016  . Overweight   . Pain in joint of left shoulder 11/09/2016  . Pain in joint, multiple sites 11/18/2018  . Palpitations 05/15/2013   [facility] manages  . Periodic limb movement sleep disorder 03/28/2017  . Perirectal cyst 05/07/2016  . PONV (postoperative nausea and vomiting)   . Positive ANA (antinuclear antibody) 11/18/2018  . Post concussion syndrome 06/06/2015  . Post concussive syndrome 07/14/2015   Ms Antigua's post-concussive syndrome manifesting in vertigo and headache, mood changes, poor balance, dizziness, and decreased concentration per Dr Jaynee Eagles at [facility].   Marland Kitchen Posterior vitreous detachment of right eye 2014  . S/P left THA, AA 07/25/2016  . Shingles   . Shortness of breath dyspnea    with exertion  . Sicca syndrome ([place]) 11/18/2018   (+) ANA  . Sleep walking and eating 03/06/2017  . Snoring 03/06/2017  . Spondylosis of lumbar region without myelopathy or radiculopathy 10/30/2011  . Thyroid nodule 08/11/2009   Findings: The thyroid gland is within normal limits in size.  The gland is diffusely inhomogeneous. A small solid nodule is noted in the lower pole  medially on the right of 7 x 6 x 8 mm. A small solid nodule is noted inferiorly on the left of 3 x 3 x 4 mm.  IMPRESSION:  The thyroid gland is within normal limits in size with only small solid nodules present, the largest of only 8 mm in diameter on the right.    . 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   . Vasomotor symptoms due to menopause 04/19/2017  . Vitamin D deficiency 05/08/2017  . Yeast vaginitis 07/11/2019    Family History    Problem Relation Age of Onset  . Alzheimer's disease Mother   . Hyperlipidemia Mother   . Hypertension Mother   . Osteoporosis Mother   . Parkinson's disease Mother   . Migraines Sister   . Allergies Sister   . Hypertension Sister   . Hyperlipidemia Brother   . Cardiomyopathy Brother   . Diabetes type II Brother   . Kidney disease Brother   . Asthma Brother   . Heart disease Brother   . Hypertension Brother   . Hyperlipidemia Brother   . Kidney disease Brother   . Heart disease Father   . Hyperlipidemia Father   . Hypertension Father   . Aortic aneurysm Father   . Early death Father 72  . Diabetes type II Other   . Breast cancer Maternal Aunt     Social History   Socioeconomic History  . Marital status: Married    Spouse name: Monserath Archibald  . Number of children: 1  . Years of education: 10  . Highest education level: Not on file  Occupational History  . Occupation: Psychologist, counselling: [employer]    Comment: Retired  . Occupation: Receptionist    Employer: [employer]    Comment: Part-time  Tobacco Use  . Smoking status: Passive Smoke Exposure - Never Smoker  . Smokeless tobacco: Never Used  . Tobacco comment: As a child  Substance and Sexual Activity  . Alcohol use: No    Alcohol/week: 0.0 standard drinks    Comment: No use in 30 years.  . Drug use: No  . Sexual activity: Yes    Partners: Male  Other Topics Concern  . Not on file  Social History Narrative   Prior PCP With [facility] at [place], [place].   Married, lives with Gaston, (b. 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    Wears seatbelt usually   No religious beliefs affecting healthcare   No difficulty taking medications as directed.       Home has working smoke alarm   No home throw rugs   Does not have nonslip bathtub / shower    Has railings on all stairs   Home is free from  [place].    Right-handed.      No history of Hospitalization as of 07/2016.       Best number to reach patient [phone] (M) as of 07/01/15   It is permissible to leave messages as of 07/01/15 .    No regular exercise of 3 times a week for 30 minutes at a time      Raymonde Hendy has Advanced Directive and a Doctor, general practice is husband, Shaneia Gaymon [phone]      Caffeine: 2-3 cups coffee per day   Social Determinants of Health   Financial Resource Strain:   . Difficulty of Paying Living Expenses: Not on file  Food Insecurity:   . Worried About Charity fundraiser in the Last Year: Not on file  . Ran Out of Food in the Last Year: Not on file  Transportation Needs:   . Lack of Transportation (Medical): Not on file  . Lack of Transportation (Non-Medical): Not on file  Physical Activity:   . Days of Exercise per Week: Not on file  . Minutes of Exercise per Session: Not on file  Stress:   . Feeling of Stress : Not on file  Social Connections:   . Frequency of Communication with Friends and Family: Not on file  . Frequency of Social Gatherings with Friends and Family: Not on file  . Attends Religious Services: Not on file  . Active Member of Clubs or Organizations: Not on file  . Attends Archivist Meetings: Not on file  . Marital Status: Not on file  Intimate Partner Violence:   . Fear of Current or Ex-Partner: Not on file  . Emotionally Abused: Not on file  . Physically Abused: Not on file  . Sexually Abused: Not on file    Past Surgical History:  Procedure Laterality Date  . Bladder dilitation     x 3  . BREAST BIOPSY Right 2011   Benign histology  . CARDIOVASCULAR STRESS TEST  2000   Unremarkable per pt report  . CARPOMETACARPAL JOINT ARTHROTOMY Right 2011  . COLONOSCOPY    . COLONOSCOPY WITH PROPOFOL N/A 04/21/2015   Procedure: COLONOSCOPY WITH PROPOFOL;  Surgeon: Carol Ada, MD;  Location:  [facility];  Service: Endoscopy;  Laterality:  N/A;  . EPIDURAL BLOCK INJECTION Left 04/12/2016   Left Medial Nerve Block and Left L5 ramus block, Dr Suella Broad   . EPIDURAL BLOCK INJECTION  03/21/2016   Left L3-4 medial branch block and Left L5 & dorsal ramus block   . EPIDURAL BLOCK INJECTION N/A 10/25/2016   Suella Broad, MD. Lumbar medial branch block  . EPIDURAL BLOCK INJECTION N/A 02/09/2017   Suella Broad, MD.  Bilateral L3/4 medial branch block, bilateral L5 dorsal ramus block  . EPIDURAL BLOCK INJECTION N/A 07/04/2017   Suella Broad, MD  . FECAL TRANSPLANT  04/21/2015   Procedure: FECAL TRANSPLANT;  Surgeon: Carol Ada, MD;  Location: [place];  Service: Endoscopy;;  . HIP ARTHROPLASTY Left   . HIP ARTHROSCOPY Left 03/06/2018   Left hip arthroplasty, redo for loose hip arthroplasty. Procedure at [facility] hospital  . INJECTION HIP INTRA ARTICULAR Left 11/2015   for OA by Dr Suella Broad  . OTHER SURGICAL HISTORY Left 2016   Left L3/L4 medial nerve block and Left L5 Dorsal Ramus block Dr Mickel Duhamel  . TOTAL HIP ARTHROPLASTY Left 07/25/2016   Procedure: LEFT TOTAL HIP ARTHROPLASTY ANTERIOR APPROACH;  Surgeon: Paralee Cancel, MD;  Location: [facility];  Service: Orthopedics;  Laterality: Left;      Current Outpatient Medications:  .  cetirizine (ZYRTEC) 10 MG tablet, Take 10 mg by mouth daily., Disp: , Rfl:  .  citalopram (CELEXA) 20 MG tablet, Take 1 tablet (20 mg total) by mouth daily., Disp: 90 tablet, Rfl: 0 .  dicyclomine (BENTYL) 20 MG tablet, Take 1 tablet (20 mg total) by mouth 3 (three) times daily as needed for spasms., Disp: 20 tablet, Rfl: 0 .  eletriptan (RELPAX) 40 MG tablet, Take 1 tablet (40 mg total) by mouth as needed for migraine. may repeat in 2 hours if necessary, Disp: 10 tablet, Rfl: 0 .  fidaxomicin (DIFICID) 200 MG TABS tablet, Take 1 tablet (200 mg total) by mouth 2 (two) times daily., Disp: , Rfl:  .  Galcanezumab-gnlm (EMGALITY) 120 MG/ML SOAJ, Inject 120 mg into the skin every 30 (thirty)  days., Disp: 3 pen, Rfl: 3 .  levothyroxine (SYNTHROID) 125 MCG tablet, Take 0.5 tablets (62.5 mcg total) by mouth daily., Disp: 90 tablet, Rfl: 1 .  meloxicam (MOBIC) 15 MG tablet, Take by mouth., Disp: , Rfl:  .  methocarbamol (ROBAXIN) 500 MG tablet, Take 500 mg by mouth 4 (four) times daily., Disp: , Rfl:  .  ondansetron (ZOFRAN ODT) 4 MG disintegrating tablet, Take 1 tablet (4 mg total) by mouth every 8 (eight) hours as needed., Disp: 20 tablet, Rfl: 0 .  Rimegepant Sulfate (NURTEC) 75 MG TBDP, Take 75 mg by mouth daily as needed (take for abortive therapy of migraine, no more than 1 tablet in 24 hours or 10 per month)., Disp: 10 tablet, Rfl: 11 .  rosuvastatin (CRESTOR) 10 MG tablet, TAKE 1 TABLET(10 MG) BY MOUTH DAILY, Disp: 90 tablet, Rfl: 3 .  traMADol (ULTRAM) 50 MG tablet, Take 2 tablets (100 mg total) by mouth 2 (two) times daily. (Patient taking differently: Take 100 mg by mouth as needed. ), Disp: 120 tablet, Rfl: 0 .  zolpidem (AMBIEN) 10 MG tablet, TAKE 1 TABLET(10 MG) BY MOUTH AT BEDTIME AS NEEDED, Disp: 30 tablet, Rfl: 0    Physical Exam: Blood pressure 110/78, pulse (!) 104, height 5' 6.5" (1.689 m), weight 202 lb (91.6 kg), SpO2 97 %.  Affect appropriate Healthy:  appears stated age 80: normal Neck supple with no adenopathy JVP normal no bruits no thyromegaly Lungs clear with no wheezing and good diaphragmatic motion Heart:  S1/S2 no murmur, no rub, gallop or click PMI normal Abdomen: benighn, BS positve, no tenderness, no AAA no bruit.  No HSM or HJR Distal pulses intact with no bruits No edema Neuro non-focal Skin warm and dry No muscular weakness   Labs:   Lab Results  Component Value Date   WBC 6.6 07/05/2019   HGB 13.3 07/05/2019   HCT 42.1 07/05/2019   MCV 95.0 07/05/2019   PLT 271 07/05/2019   No results for input(s): NA, K, CL, CO2, BUN, CREATININE, CALCIUM, PROT, BILITOT, ALKPHOS, ALT, AST, GLUCOSE in the last 168 hours.  Invalid input(s):  LABALBU No results found for: CKTOTAL, CKMB, CKMBINDEX, TROPONINI  Lab Results  Component Value Date   CHOL 270 (A) 02/24/2019   CHOL 145 05/07/2018   CHOL 150 07/04/2017   Lab Results  Component Value Date   HDL 44 02/24/2019   HDL 47 05/07/2018   HDL 48 07/04/2017   Lab Results  Component Value Date   LDLCALC 187 02/24/2019   LDLCALC 75 05/07/2018   LDLCALC 78 07/04/2017   Lab Results  Component Value Date   TRIG 194 (A) 02/24/2019   TRIG 115 05/07/2018   TRIG 119 07/04/2017   Lab Results  Component Value Date   CHOLHDL 3.9 12/14/2014   No results found for: LDLDIRECT    Radiology: No results found.  EKG: SR rate 104 normal 07/07/19    ASSESSMENT AND PLAN:   1. Family history of CAD:  With HLD Discussed options with patient Favor ETT and calcium score to further risk stratify   2. HLD:  Continue statin needs repeat labs given family history suggest target LDL 70 or less  3. Hypothyroidism:  Continue synthroid replacement needs f/u TSH/T4 with primary   Signed: Jenkins Rouge 10/22/2019, 10:01 AM

## 2019-10-21 ENCOUNTER — Other Ambulatory Visit: Payer: Self-pay | Admitting: Family Medicine

## 2019-10-21 DIAGNOSIS — Z8619 Personal history of other infectious and parasitic diseases: Secondary | ICD-10-CM

## 2019-10-22 ENCOUNTER — Encounter: Payer: Self-pay | Admitting: Cardiovascular Disease

## 2019-10-22 ENCOUNTER — Other Ambulatory Visit: Payer: Self-pay

## 2019-10-22 ENCOUNTER — Ambulatory Visit (INDEPENDENT_AMBULATORY_CARE_PROVIDER_SITE_OTHER): Payer: BC Managed Care – PPO | Admitting: Cardiovascular Disease

## 2019-10-22 VITALS — BP 110/78 | HR 104 | Ht 66.5 in | Wt 202.0 lb

## 2019-10-22 DIAGNOSIS — E785 Hyperlipidemia, unspecified: Secondary | ICD-10-CM

## 2019-10-22 DIAGNOSIS — Z8249 Family history of ischemic heart disease and other diseases of the circulatory system: Secondary | ICD-10-CM

## 2019-10-22 NOTE — Patient Instructions (Signed)
Medication Instructions:   *If you need a refill on your cardiac medications before your next appointment, please call your pharmacy*  Lab Work: Your physician recommends that you return for lab work when you come in for testing. Fasting lipid and liver panel. If you have labs (blood work) drawn today and your tests are completely normal, you will receive your results only by: Marland Kitchen MyChart Message (if you have MyChart) OR . A paper copy in the mail If you have any lab test that is abnormal or we need to change your treatment, we will call you to review the results.  Testing/Procedures: Your physician has requested that you have an exercise tolerance test. For further information please visit [email or web address]. Please also follow instruction sheet, as given.  CT scanning for calcium score,(CAT scanning), is a noninvasive, special x-ray that produces cross-sectional images of the body using x-rays and a computer. CT scans help physicians diagnose and treat medical conditions. For some CT exams, a contrast material is used to enhance visibility in the area of the body being studied. CT scans provide greater clarity and reveal more details than regular x-ray exams.  Follow-Up: At [facility], you and your health needs are our priority.  As part of our continuing mission to provide you with exceptional heart care, we have created designated Provider Care Teams.  These Care Teams include your primary Cardiologist (physician) and Advanced Practice Providers (APPs -  Physician Assistants and Nurse Practitioners) who all work together to provide you with the care you need, when you need it.  Your next appointment:   3 week(s) (after testing)  The format for your next appointment:   In Person  Provider:   You may see Dr. Johnsie Cancel or one of the following Advanced Practice Providers on your designated Care Team:    Truitt Merle, NP  Cecilie Kicks, NP  Kathyrn Drown, NP

## 2019-10-27 ENCOUNTER — Ambulatory Visit: Payer: BC Managed Care – PPO | Admitting: Internal Medicine

## 2019-10-30 ENCOUNTER — Ambulatory Visit: Payer: BC Managed Care – PPO | Admitting: Internal Medicine

## 2019-10-31 ENCOUNTER — Other Ambulatory Visit (HOSPITAL_COMMUNITY)
Admission: RE | Admit: 2019-10-31 | Discharge: 2019-10-31 | Disposition: A | Discharge status: Home / Self Care | Payer: BC Managed Care – PPO | Source: Ambulatory Visit | Attending: Cardiovascular Disease | Admitting: Cardiovascular Disease

## 2019-10-31 DIAGNOSIS — Z20822 Contact with and (suspected) exposure to covid-19: Secondary | ICD-10-CM | POA: Insufficient documentation

## 2019-10-31 DIAGNOSIS — Z01812 Encounter for preprocedural laboratory examination: Secondary | ICD-10-CM | POA: Insufficient documentation

## 2019-10-31 LAB — SARS CORONAVIRUS 2 (TAT 6-24 HRS): SARS Coronavirus 2: NEGATIVE

## 2019-11-03 ENCOUNTER — Telehealth: Payer: Self-pay

## 2019-11-03 NOTE — Telephone Encounter (Signed)
Voicemail: Patient stating appointment was cancelled due to inclement weather.  She was referred for possible stool transplant. She can not wait for next available March 17 appointment.   I spoke with Dr Baxter Flattery and she has agreed to see patient for e visit on 10-04-2019@ 1:30 .Left message on voice mail.     Laverle Patter, RN

## 2019-11-04 ENCOUNTER — Ambulatory Visit (INDEPENDENT_AMBULATORY_CARE_PROVIDER_SITE_OTHER): Payer: BC Managed Care – PPO

## 2019-11-04 ENCOUNTER — Ambulatory Visit (INDEPENDENT_AMBULATORY_CARE_PROVIDER_SITE_OTHER)
Admission: RE | Admit: 2019-11-04 | Discharge: 2019-11-04 | Disposition: A | Discharge status: Home / Self Care | Payer: Self-pay | Source: Ambulatory Visit | Attending: Cardiovascular Disease | Admitting: Cardiovascular Disease

## 2019-11-04 ENCOUNTER — Ambulatory Visit (INDEPENDENT_AMBULATORY_CARE_PROVIDER_SITE_OTHER): Payer: BC Managed Care – PPO | Admitting: Internal Medicine

## 2019-11-04 ENCOUNTER — Other Ambulatory Visit: Payer: Self-pay

## 2019-11-04 DIAGNOSIS — E785 Hyperlipidemia, unspecified: Secondary | ICD-10-CM

## 2019-11-04 DIAGNOSIS — A0471 Enterocolitis due to Clostridium difficile, recurrent: Secondary | ICD-10-CM

## 2019-11-04 DIAGNOSIS — Z8249 Family history of ischemic heart disease and other diseases of the circulatory system: Secondary | ICD-10-CM

## 2019-11-04 MED ORDER — VANCOMYCIN HCL 125 MG PO CAPS: 125.0000 mg | ORAL_CAPSULE | Freq: Four times a day (QID) | ORAL | 1 refills | Status: DC

## 2019-11-04 NOTE — Progress Notes (Signed)
Virtual Visit via Telephone Note  I connected with Molly Giles on 11/04/19 at  1:30 PM EST by telephone and verified that I am speaking with the correct person using two identifiers.  Location: Patient: at parking lot at medical office Provider: clinic office   I discussed the limitations, risks, security and privacy concerns of performing an evaluation and management service by telephone and the availability of in person appointments. I also discussed with the patient that there may be a patient responsible charge related to this service. The patient expressed understanding and agreed to proceed.   History of Present Illness:  64yo F with history of recurrent cystitis, and previous hx of cdifficile in 2016. She had been doing well up until Fall 2020 where she was treated for uti for amox/clav. She started to have watery diarrhea, n/v, 5lb weight loss and dx of recurrence of cdifficile in Oct 2020.  Last time she had been tested for cdiff in early feb. But initially this relapse started in November. She recalls initially treated with 10d course of oral vancomycin. Then did well for a few weeks then had to do vanco taper on nov 13th. She reports again 10-14d improvement then relapse on jan 8th. Treated again with a few courses of 10d of oral vanco without improvement. She did receive dificid - 10d course in beginning of February. Last tested 3 weeks ago (beginning of February)  Then improved for 1 week then started to have recurrent watery stools but now with abdomen cramping, moderate. Talked to PA to restarted oral vancomycin. Now having 2-4 bm loose, no cramping. Has had some blood in stool but difficult to tell because of fissure.  Hx of fissure - noticed that is opening, also very raw in the anal region.- hasn't been evaluated yet.  Follows by [facility]/ dr outlaw would be doing fecal transplant - but not see yet  No fevers or chills, weight loss now normalized, at most  #5lb.    Observation: no exam  Assessment and Plan: Recurrent cdifficile. Will need to get copy of [facility]. For now,  Will send in refill for vanco Taper  FDA has reopened use of fecal transplant. We will Contact [facility] stool specimen - to find assoc. costs vs. MAb use -zinplava Will get in touch with dr Paulita Fujita  Anal fissure = recommend to see gi for management  Follow Up Instructions:    I discussed the assessment and treatment plan with the patient. The patient was provided an opportunity to ask questions and all were answered. The patient agreed with the plan and demonstrated an understanding of the instructions.   The patient was advised to call back or seek an in-person evaluation if the symptoms worsen or if the condition fails to improve as anticipated.  I provided 45 minutes of non-face-to-face time during this encounter for cdifficile treatment management   Carlyle Basques, MD

## 2019-11-05 ENCOUNTER — Other Ambulatory Visit: Payer: BC Managed Care – PPO

## 2019-11-05 LAB — EXERCISE TOLERANCE TEST
Estimated workload: 4.6 METS
Exercise duration (min): 3 min
Exercise duration (sec): 0 s
MPHR: 156 {beats}/min
Peak HR: 144 {beats}/min
Percent HR: 92 %
RPE: 16
Rest HR: 90 {beats}/min

## 2019-11-06 ENCOUNTER — Other Ambulatory Visit: Payer: BC Managed Care – PPO | Admitting: *Deleted

## 2019-11-06 ENCOUNTER — Other Ambulatory Visit: Payer: Self-pay

## 2019-11-06 LAB — LIPID PANEL
Chol/HDL Ratio: 3.3 ratio (ref 0.0–4.4)
Cholesterol, Total: 156 mg/dL (ref 100–199)
HDL: 48 mg/dL (ref >39)
LDL Chol Calc (NIH): 85 mg/dL (ref 0–99)
Triglycerides: 129 mg/dL (ref 0–149)
VLDL Cholesterol Cal: 23 mg/dL (ref 5–40)

## 2019-11-06 LAB — HEPATIC FUNCTION PANEL
ALT: 23 IU/L (ref 0–32)
AST: 23 IU/L (ref 0–40)
Albumin: 4.2 g/dL (ref 3.8–4.8)
Alkaline Phosphatase: 76 IU/L (ref 39–117)
Bilirubin Total: 0.3 mg/dL (ref 0.0–1.2)
Bilirubin, Direct: 0.09 mg/dL (ref 0.00–0.40)
Total Protein: 6.5 g/dL (ref 6.0–8.5)

## 2019-11-06 NOTE — Progress Notes (Signed)
Office Visit Note  Patient: Molly Giles             Date of Birth: [birth year]           MRN: [id number]             PCP: McDiarmid, Blane Ohara, MD Referring: McDiarmid, Blane Ohara, MD Visit Date: 11/12/2019 Occupation: @GUAROCC @  Subjective:  Fatigue   History of Present Illness: Molly Giles is a 64 y.o. female with history of fibromyalgia, osteoarthritis, and DDD.  She denies any pain or swelling in her knee joints.  She denies any lower back pain or symptoms of radiculopathy.  She continues to have chronic fatigue related to insomnia.  She takes ambien 10 mg 1 tablet by mouth at bedtime for insomnia.  She states she wakes up tired no matter how many hours of sleep she gets.  She states her fibromyalgia pain has been tolerable recently.    Activities of Daily Living:  Patient reports morning stiffness for 20 minutes.   Patient Denies nocturnal pain.  Difficulty dressing/grooming: Denies Difficulty climbing stairs: Reports Difficulty getting out of chair: Reports Difficulty using hands for taps, buttons, cutlery, and/or writing: Denies  Review of Systems  Constitutional: Positive for fatigue.  HENT: Positive for mouth dryness. Negative for mouth sores and nose dryness.   Eyes: Positive for dryness. Negative for pain and visual disturbance.  Respiratory: Negative for cough, hemoptysis, shortness of breath and difficulty breathing.   Cardiovascular: Negative for chest pain, palpitations, hypertension and swelling in legs/feet.  Gastrointestinal: Positive for diarrhea. Negative for blood in stool and constipation.  Endocrine: Negative for increased urination.  Genitourinary: Negative for painful urination.  Musculoskeletal: Positive for arthralgias, joint pain and morning stiffness. Negative for joint swelling, myalgias, muscle weakness, muscle tenderness and myalgias.  Skin: Negative for color change, pallor, rash, hair loss, nodules/bumps, skin tightness, ulcers and sensitivity  to sunlight.  Allergic/Immunologic: Negative for susceptible to infections.  Neurological: Negative for dizziness, numbness, headaches and weakness.  Hematological: Negative for swollen glands.  Psychiatric/Behavioral: Positive for sleep disturbance (Ambien 10 mg po at bedtime for insomnia). Negative for depressed mood. The patient is not nervous/anxious.     PMFS History:  Patient Active Problem List   Diagnosis Date Noted  . Hypokalemia due to excessive gastrointestinal loss of potassium 07/28/2019  . 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 DDD (degenerative disc disease), cervical 11/18/2018  . DDD (degenerative disc disease), lumbar 11/18/2018  . Sicca syndrome ([place]) 11/18/2018  . Chronic contact dermatitis 08/06/2018  . Vitamin D deficiency 05/08/2017  . Vasomotor symptoms due to menopause 04/19/2017  . Periodic limb movement sleep disorder 03/28/2017  . Obesity (BMI 30.0-34.9) 01/29/2017  . Other insomnia 11/09/2016  . Irritable bowel syndrome with diarrhea 04/03/2016  . Left ventricular hypertrophy, mild 02/25/2016  . 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 Post concussive syndrome 07/14/2015  . Depression (NOS) 07/02/2015  . Allergic rhinoconjunctivitis 07/02/2015  . Fibromyalgia syndrome 07/01/2015  . History of Clostridium difficile colitis, persistent 07/01/2015  . Essential hypertension 05/15/2013  . Hypothyroidism 05/15/2013  . Hyperlipidemia 05/15/2013  . Interstitial cystitis 05/15/2013  . Chronic migraine without aura 05/15/2013  . Spondylosis of lumbar region without myelopathy or radiculopathy 10/30/2011    Past Medical History:  Diagnosis Date  . Allergic rhinoconjunctivitis 07/02/2015  . Arthritis   . Benign positional vertigo 04/2015   Responded well to [facility]  . Bruxism (teeth grinding)   . Chronic contact dermatitis  08/06/2018   Per allergist Dr Pablo Lawrence.   . Chronic migraine 02/25/2016   Per Dr Jaynee Eagles review in  notes from [facility] from September 2015. Showed total headache days last month 18. Severe headache days 7 days. Moderate headache days 5 days. Mild headache days last month sick days. Days without headache last month 10 days. Symptoms associated with photophobia, phonophobia, osmophobia, neck pain, dizziness, jaw pain, nasal congestion, vision disturbances, tingling and numbness, weakness and worsening with activity. Each headache attack last 3 hours depending on treatment in severity. Left side, the right side, easier side, the frontal area in the back of the head. Characterized as throbbing, pressure, tightness, squeezing, stabbing and burning   . Chronic migraine without aura 05/15/2013    Dr Jaynee Eagles [facility]  . DDD (degenerative disc disease), cervical 11/18/2018  . DDD (degenerative disc disease), lumbar   . Depression   . Disc displacement, lumbar   . Dysrhythmia    seen by dr Wynonia Lawman- not a problem since she has been on Bystolic  . Episodic cluster headache, not intractable 03/06/2017  . Essential hypertension 05/15/2013  . Family history of adverse reaction to anesthesia    Brother- N/V  . Family history of premature CAD 05/15/2013  . Fibromyalgia syndrome 07/01/2015   Management by Dr Chauncey Cruel. Devashwar (Rheum)   . GERD (gastroesophageal reflux disease) 12/16/2014  . H/O seasonal allergies   . Hammer toe    Left great toe  . Hearing loss of both ears 07/27/2015   mild to borderline moderate low frequency hearing loss improving to within normal limits bilaterally on audiology testing at [facility] in November 2016.    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 History of colonic polyps   . Hx of bad fall 02/2015   Severe Facial/head trauma without fracture  . 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 Hyperlipidemia 1998  . Hypothyroidism   . Impairment of balance 02/2015   Consequent of  postconcussive syndrome  . Injury of triangular fibrocartilage complex of left wrist 02/25/2019   Dx 02/25/19 Iran Planas IV MD [facility])  . Insulin resistance 07/04/2017  . Interstitial cystitis   . Irritable bowel syndrome with diarrhea 04/03/2016  . Left ventricular hypertrophy, mild 02/25/2016   ECHOcardiogram report 06/17/15 showing EF55-60%, mild LVH and G1DD   . 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[place]) 03/10/2018   [facility]  . Lumbar facet joint pain   . 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 Mood disorder ([place])   . Morbid obesity ([place]) 03/06/2017  . Musculoskeletal neck pain 07/14/2015  . Nocturnal hypoxemia 03/06/2017  . Normal coronary arteries 05/14/2014  . Obesity (BMI 30.0-34.9) 01/29/2017  . Osteoarthritis of left hip 11/01/2015   MRI order by Dr Alvan Dame (ortho) 10/2015 showed significant arthritis of left hip joint with cystic changes in femoral head c/w osteoarthritis  . 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 Other insomnia 11/09/2016  . Overweight   . Pain in joint of left shoulder 11/09/2016  . Pain in joint, multiple sites 11/18/2018  . Palpitations 05/15/2013   [facility] manages  . Periodic limb movement sleep disorder 03/28/2017  . Perirectal cyst 05/07/2016  . PONV (postoperative nausea and vomiting)   . Positive ANA (antinuclear antibody) 11/18/2018  . Post concussion syndrome 06/06/2015  . Post concussive syndrome 07/14/2015   Ms Pressey's post-concussive syndrome manifesting in vertigo and headache, mood changes,  poor balance, dizziness, and decreased concentration per Dr Jaynee Eagles at [facility].   Marland Kitchen Posterior vitreous detachment of right eye 2014  . S/P left THA, AA 07/25/2016  . Shingles   . Shortness of breath dyspnea    with exertion  . Sicca syndrome ([place]) 11/18/2018   (+) ANA  . Sleep walking and eating 03/06/2017  . Snoring 03/06/2017  . Spondylosis of lumbar region without myelopathy or radiculopathy  10/30/2011  . Thyroid nodule 08/11/2009   Findings: The thyroid gland is within normal limits in size.  The gland is diffusely inhomogeneous. A small solid nodule is noted in the lower pole  medially on the right of 7 x 6 x 8 mm. A small solid nodule is noted inferiorly on the left of 3 x 3 x 4 mm.  IMPRESSION:  The thyroid gland is within normal limits in size with only small solid nodules present, the largest of only 8 mm in diameter on the right.    . 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   . Vasomotor symptoms due to menopause 04/19/2017  . Vitamin D deficiency 05/08/2017  . Yeast vaginitis 07/11/2019    Family History  Problem Relation Age of Onset  . Alzheimer's disease Mother   . Hyperlipidemia Mother   . Hypertension Mother   . Osteoporosis Mother   . Parkinson's disease Mother   . Migraines Sister   . Allergies Sister   . Hypertension Sister   . Hyperlipidemia Brother   . Cardiomyopathy Brother   . Diabetes type II Brother   . Kidney disease Brother   . Asthma Brother   . Heart disease Brother   . Hypertension Brother   . Hyperlipidemia Brother   . Kidney disease Brother   . Heart disease Father   . Hyperlipidemia Father   . Hypertension Father   . Aortic aneurysm Father   . Early death Father 28  . Diabetes type II Other   . Breast cancer Maternal Aunt    Past Surgical History:  Procedure Laterality Date  . Bladder dilitation     x 3  . BREAST BIOPSY Right 2011   Benign histology  . CARDIOVASCULAR STRESS TEST  2000   Unremarkable per pt report  . CARPOMETACARPAL JOINT ARTHROTOMY Right 2011  . COLONOSCOPY    . COLONOSCOPY WITH PROPOFOL N/A 04/21/2015   Procedure: COLONOSCOPY WITH PROPOFOL;  Surgeon: Carol Ada, MD;  Location: [place];  Service: Endoscopy;  Laterality: N/A;  . EPIDURAL BLOCK INJECTION Left 04/12/2016   Left Medial Nerve Block and Left L5 ramus block, Dr Suella Broad   . EPIDURAL  BLOCK INJECTION  03/21/2016   Left L3-4 medial branch block and Left L5 & dorsal ramus block   . EPIDURAL BLOCK INJECTION N/A 10/25/2016   Suella Broad, MD. Lumbar medial branch block  . EPIDURAL BLOCK INJECTION N/A 02/09/2017   Suella Broad, MD.  Bilateral L3/4 medial branch block, bilateral L5 dorsal ramus block  . EPIDURAL BLOCK INJECTION N/A 07/04/2017   Suella Broad, MD  . FECAL TRANSPLANT  04/21/2015   Procedure: FECAL TRANSPLANT;  Surgeon: Carol Ada, MD;  Location: [place];  Service: Endoscopy;;  . HIP ARTHROPLASTY Left   . HIP ARTHROSCOPY Left 03/06/2018   Left hip arthroplasty, redo for loose hip arthroplasty. Procedure at [facility]  . INJECTION HIP INTRA ARTICULAR Left 11/2015   for OA by Dr Suella Broad  . OTHER  SURGICAL HISTORY Left 2016   Left L3/L4 medial nerve block and Left L5 Dorsal Ramus block Dr Mickel Duhamel  . TOTAL HIP ARTHROPLASTY Left 07/25/2016   Procedure: LEFT TOTAL HIP ARTHROPLASTY ANTERIOR APPROACH;  Surgeon: Paralee Cancel, MD;  Location: [facility];  Service: Orthopedics;  Laterality: Left;   Social History   Social History Narrative   Prior PCP With [facility] at [place], [place].   Married, lives with Diomede, (b. 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    Wears seatbelt usually   No religious beliefs affecting healthcare   No difficulty taking medications as directed.       Home has working smoke alarm   No home throw rugs   Does not have nonslip bathtub / shower    Has railings on all stairs   Home is free from [place].    Right-handed.      No history of Hospitalization as of 07/2016.       Best number to reach patient [phone] (M) as of 07/01/15   It is permissible to leave messages as of 07/01/15 .    No regular exercise of 3 times a week for 30 minutes at a time      Sophonie Aring has Advanced Directive and a Doctor, general practice  is husband, Eevee Whitehorse [phone]      Caffeine: 2-3 cups coffee per day   Immunization History  Administered Date(s) Administered  . Influenza Inj Mdck Quad Pf 08/12/2018  . Influenza,inj,Quad PF,6+ Mos 07/01/2015, 05/10/2017, 06/26/2019  . Influenza-Unspecified 09/13/2016, 08/12/2018  . Tdap 11/09/2011, 02/24/2016, 09/13/2016  . Zoster Recombinat (Shingrix) 08/12/2018     Objective: Vital Signs: BP (!) 124/92 (BP Location: Left Arm, Patient Position: Sitting, Cuff Size: Small)   Pulse 92   Resp 12   Ht 5' 6.5" (1.689 m)   Wt 203 lb 6.4 oz (92.3 kg)   BMI 32.34 kg/m    Physical Exam Vitals and nursing note reviewed.  Constitutional:      Appearance: She is well-developed.  HENT:     Head: Normocephalic and atraumatic.  Eyes:     Conjunctiva/sclera: Conjunctivae normal.  Pulmonary:     Effort: Pulmonary effort is normal.  Abdominal:     General: Bowel sounds are normal.     Palpations: Abdomen is soft.  Musculoskeletal:     Cervical back: Normal range of motion.  Lymphadenopathy:     Cervical: No cervical adenopathy.  Skin:    General: Skin is warm and dry.     Capillary Refill: Capillary refill takes less than 2 seconds.  Neurological:     Mental Status: She is alert and oriented to person, place, and time.  Psychiatric:        Behavior: Behavior normal.      Musculoskeletal Exam: C-spine, thoracic spine, and lumbar spine good ROM.  No midline spinal tenderness.  No SI joint tenderness.  Shoulder joints, elbow joints, wrist joints, MCPs, PIPs, and DIPs good ROM with no synovitis. Mild DIP synovial thickening.  Left CMC joint synovial thickening. Hip joints, knee joints, ankle joints, MTPs, PIPs, and DIPs good ROM with no synovitis.  No warmth or effusion of knee joints.  No tenderness or swelling of ankle joints.   CDAI Exam: CDAI Score: -- Patient Global: --; Provider Global: -- Swollen: --; Tender: -- Joint Exam 11/12/2019   No joint exam  has been  documented for this visit   There is currently no information documented on the homunculus. Go to the Rheumatology activity and complete the homunculus joint exam.  Investigation: No additional findings.  Imaging: CT CARDIAC SCORING  Addendum Date: 11/05/2019   ADDENDUM REPORT: 11/05/2019 08:42 CLINICAL DATA:  2F for risk stratification EXAM: Coronary Calcium Score TECHNIQUE: The patient was scanned on a [employer] scanner. Axial non-contrast 3 mm slices were carried out through the heart. The data set was analyzed on a dedicated work station and scored using the [place]. FINDINGS: Non-cardiac: See separate report from [facility]. Ascending Aorta: Normal size. 3.2 cm. Mild calcification of the aortic root. Pericardium: Normal Coronary arteries: Normal coronary origins. Scattered calcifications in the LAD and LCX territories. IMPRESSION: Coronary calcium score of 22. This was 68th percentile for age and sex matched control. Small, scattered calcifications noted in the LAD and LCX territories. Recommend aggressive risk factor modification. Skeet Latch, MD Electronically Signed   By: Skeet Latch   On: 11/05/2019 08:42   Result Date: 11/05/2019 EXAM: OVER-READ INTERPRETATION  CT CHEST The following report is an over-read performed by radiologist Dr. Suzy Bouchard of [facility], [place] on 11/04/2019. This over-read does not include interpretation of cardiac or coronary anatomy or pathology. The coronary calcium score interpretation by the cardiologist is attached. COMPARISON:  None. FINDINGS: Limited view of the lung parenchyma demonstrates no suspicious nodularity. Airways are normal. Limited view of the mediastinum demonstrates no adenopathy. Esophagus normal. Limited view of the upper abdomen unremarkable. Limited view of the skeleton and chest wall is unremarkable. IMPRESSION: No significant extracardiac findings. Electronically Signed: By: Suzy Bouchard M.D. On:  11/04/2019 15:34   EXERCISE TOLERANCE TEST (ETT)  Result Date: 11/05/2019  Blood pressure demonstrated a normal response to exercise.  There was no ST segment deviation noted during stress.  No T wave inversion was noted during stress.  Moderately reduced exercise capacity.  Negative, adequate stress test.     Recent Labs: Lab Results  Component Value Date   WBC 6.6 07/05/2019   HGB 13.3 07/05/2019   PLT 271 07/05/2019   NA 140 07/24/2019   K 4.6 07/24/2019   CL 103 07/24/2019   CO2 21 07/24/2019   GLUCOSE 78 07/24/2019   BUN 17 07/24/2019   CREATININE 0.93 07/24/2019   BILITOT 0.3 11/06/2019   ALKPHOS 76 11/06/2019   AST 23 11/06/2019   ALT 23 11/06/2019   PROT 6.5 11/06/2019   ALBUMIN 4.2 11/06/2019   CALCIUM 9.8 07/24/2019   GFRAA 75 07/24/2019    Speciality Comments: No specialty comments available.  Procedures:  No procedures performed Allergies: Sulfa antibiotics, Chlorhexidine gluconate, Codeine, Lyrica [pregabalin], Methylprednisolone, Prednisone, Betadine [povidone iodine], Latex, and Wellbutrin [bupropion]   Assessment / Plan:     Visit Diagnoses: Fibromyalgia: She has generalized hyperalgesia and positive tender points on exam.  Overall her fibromyalgia pain has been manageable recently.  She takes meloxicam 15 mg 1 tablet daily and tramadol very sparingly for pain relief.  She continues to take Celexa as prescribed.  She is been experiencing increased fatigue recently despite sleeping well at night taking Ambien as prescribed.  We discussed reducing the dose of Ambien to 5 mg 1 tablet by mouth at bedtime as needed.  A refill of Ambien was sent to the pharmacy today.  We discussed importance of regular exercise and good sleep hygiene.  She will follow-up in the office in 6 months.  Other insomnia -  She takes Ambien 10 mg 1 to 3/4 tablet by mouth at bedtime for insomnia.  We discussed that after the age of 37 her insurance would no longer cover the 10 mg tablets  of Ambien.  We discussed trying Ambien 5 mg 1 tablet by mouth at bedtime as needed for insomnia.  A refill of Ambien was sent to the pharmacy today.  Other fatigue: She has been experiencing worsening fatigue recently.  According to the patient she has been sleeping well at night taking Ambien 10 mg 1 tablet-3/4 tablet at bedtime for insomnia.  We discussed the importance of regular exercise and good sleep hygiene.  Primary osteoarthritis of both hands: She has mild DIP synovial thickening consistent with osteoarthritis of both hands.  No tenderness or synovitis was noted.  She has complete fist formation bilaterally.  Joint protection and muscle strengthening were discussed.  Trochanteric bursitis of left hip: She experiences intermittent trochanter bursitis of the left hip.  She was encouraged perform stretching exercises.  Status post left hip replacement: She has good range of motion of the left hip replacement with no discomfort.  DDD (degenerative disc disease), cervical: She has good range of motion no discomfort at this time.  No symptoms of radiculopathy.  DDD (degenerative disc disease), lumbar: She experiences intermittent lower back pain.  She is good range of motion lumbar spine with no discomfort.  No midline spinal tenderness.  No symptoms of radiculopathy.  Chronic pain syndrome - She is taking Mobic 15 mg po daily and tramadol as needed for pain relief.  Other medical conditions are listed as follows:  History of hyperlipidemia  Other depression  History of migraine  IC (interstitial cystitis)  Vitamin D deficiency  History of hypothyroidism  History of hypertension  History of depression  Orders: No orders of the defined types were placed in this encounter.  Meds ordered this encounter  Medications  . zolpidem (AMBIEN) 5 MG tablet    Sig: Take 1 tablet (5 mg total) by mouth at bedtime as needed for sleep.    Dispense:  30 tablet    Refill:  0       Follow-Up Instructions: Return in about 6 months (around 05/14/2020) for Fibromyalgia.   Ofilia Neas, PA-C  Note - This record has been created using Dragon software.  Chart creation errors have been sought, but may not always  have been located. Such creation errors do not reflect on  the standard of medical care.

## 2019-11-07 ENCOUNTER — Telehealth: Payer: Self-pay

## 2019-11-07 DIAGNOSIS — E785 Hyperlipidemia, unspecified: Secondary | ICD-10-CM

## 2019-11-07 MED ORDER — ROSUVASTATIN CALCIUM 20 MG PO TABS: 20.0000 mg | ORAL_TABLET | Freq: Every day | ORAL | 3 refills | Status: DC

## 2019-11-07 NOTE — Telephone Encounter (Signed)
-----   Message from Josue Hector, MD sent at 11/07/2019  2:23 PM EST ----- I would suggest going up to 20 mg daily on crestor and repeating labs in 3 months and I suspect this will get [name] to target

## 2019-11-07 NOTE — Telephone Encounter (Signed)
Patient aware of medication changes. Patient will get repeat lab work in 3 months.

## 2019-11-12 ENCOUNTER — Ambulatory Visit (INDEPENDENT_AMBULATORY_CARE_PROVIDER_SITE_OTHER): Payer: BC Managed Care – PPO | Admitting: Physician Assistant

## 2019-11-12 ENCOUNTER — Other Ambulatory Visit: Payer: Self-pay

## 2019-11-12 ENCOUNTER — Encounter: Payer: Self-pay | Admitting: Physician Assistant

## 2019-11-12 VITALS — BP 124/92 | HR 92 | Resp 12 | Ht 66.5 in | Wt 203.4 lb

## 2019-11-12 DIAGNOSIS — F3289 Other specified depressive episodes: Secondary | ICD-10-CM

## 2019-11-12 DIAGNOSIS — Z96642 Presence of left artificial hip joint: Secondary | ICD-10-CM

## 2019-11-12 DIAGNOSIS — G4709 Other insomnia: Secondary | ICD-10-CM | POA: Diagnosis not present

## 2019-11-12 DIAGNOSIS — E559 Vitamin D deficiency, unspecified: Secondary | ICD-10-CM

## 2019-11-12 DIAGNOSIS — R5383 Other fatigue: Secondary | ICD-10-CM

## 2019-11-12 DIAGNOSIS — Z8669 Personal history of other diseases of the nervous system and sense organs: Secondary | ICD-10-CM

## 2019-11-12 DIAGNOSIS — M7062 Trochanteric bursitis, left hip: Secondary | ICD-10-CM

## 2019-11-12 DIAGNOSIS — M797 Fibromyalgia: Secondary | ICD-10-CM

## 2019-11-12 DIAGNOSIS — Z8659 Personal history of other mental and behavioral disorders: Secondary | ICD-10-CM

## 2019-11-12 DIAGNOSIS — M19041 Primary osteoarthritis, right hand: Secondary | ICD-10-CM

## 2019-11-12 DIAGNOSIS — M5030 Other cervical disc degeneration, unspecified cervical region: Secondary | ICD-10-CM

## 2019-11-12 DIAGNOSIS — N3010 Interstitial cystitis (chronic) without hematuria: Secondary | ICD-10-CM

## 2019-11-12 DIAGNOSIS — M19042 Primary osteoarthritis, left hand: Secondary | ICD-10-CM

## 2019-11-12 DIAGNOSIS — Z8639 Personal history of other endocrine, nutritional and metabolic disease: Secondary | ICD-10-CM

## 2019-11-12 DIAGNOSIS — Z8679 Personal history of other diseases of the circulatory system: Secondary | ICD-10-CM

## 2019-11-12 DIAGNOSIS — G894 Chronic pain syndrome: Secondary | ICD-10-CM

## 2019-11-12 DIAGNOSIS — M5136 Other intervertebral disc degeneration, lumbar region: Secondary | ICD-10-CM

## 2019-11-12 MED ORDER — ZOLPIDEM TARTRATE 5 MG PO TABS: 5.0000 mg | ORAL_TABLET | Freq: Every evening | ORAL | 0 refills | Status: DC | PRN

## 2019-11-14 ENCOUNTER — Telehealth: Payer: Self-pay | Admitting: *Deleted

## 2019-11-14 NOTE — Telephone Encounter (Signed)
Using diltiazem hcl cream TID for anal fissure from GI, started 2/19. Seeing some improvement with this. Now concerned as she notes new bumps and itching from bottom -> stomach and lower back for 2 weeks. Attempted to treat with Zinc, hemorrhoid cream and vaseline but nothing is effective.  She takes benadryl at night to damper symptoms so she can sleep.  GI suggested daily certirizine and hydrocortisone cream today, she will try it. She is taking oral vancomycin, now on 125 mg TID as part of the prolonged taper.  She is asking if this could be a reaction to the prolonged oral vancomycin.  Do you have any other suggestions? Landis Gandy, RN

## 2019-11-25 ENCOUNTER — Other Ambulatory Visit: Payer: Self-pay | Admitting: Family Medicine

## 2019-11-25 ENCOUNTER — Ambulatory Visit: Payer: BC Managed Care – PPO | Admitting: Nurse Practitioner

## 2019-11-25 DIAGNOSIS — Z8619 Personal history of other infectious and parasitic diseases: Secondary | ICD-10-CM

## 2019-11-26 ENCOUNTER — Ambulatory Visit (INDEPENDENT_AMBULATORY_CARE_PROVIDER_SITE_OTHER): Payer: BC Managed Care – PPO | Admitting: Internal Medicine

## 2019-11-26 ENCOUNTER — Telehealth: Payer: Self-pay | Admitting: Pharmacy Technician

## 2019-11-26 ENCOUNTER — Encounter: Payer: Self-pay | Admitting: Internal Medicine

## 2019-11-26 ENCOUNTER — Other Ambulatory Visit: Payer: Self-pay

## 2019-11-26 VITALS — BP 121/87 | HR 94 | Wt 203.0 lb

## 2019-11-26 DIAGNOSIS — K602 Anal fissure, unspecified: Secondary | ICD-10-CM

## 2019-11-26 DIAGNOSIS — A0471 Enterocolitis due to Clostridium difficile, recurrent: Secondary | ICD-10-CM | POA: Diagnosis not present

## 2019-11-26 NOTE — Telephone Encounter (Addendum)
[facility] Patient Advocate Encounter  Prior Authorization for [facility] has been approved.    PA# [id number] Effective dates: 12/01/2019 through 12/01/2019.   [facility] will continue to follow.  Venida Jarvis. Nadara Mustard [place] Patient [facility] for Infectious Disease Phone: [phone] Fax:  [phone]

## 2019-11-26 NOTE — Progress Notes (Signed)
Patient ID: Molly Giles, female   DOB: 11/24/1955, 64 y.o.   MRN: [id number]  HPI Had hx of c.dfficile in the past with fmt in the past. In November, treated for uti with amox then amox/clav then develop cdifficile was treated with dificid x 1 round did well. Then relapse after 7-10days. Has now been on oral vancomycin taper. Semi form (mushy consistency) x 3 per day while bid oral vancomycin (for about 10 days). Had abdominal cramping once last week improved with one dose of bentyl Passing gas in the morning and feels bloated often  Has had several fissure from frequent for frequent diarrrhea -itching.   Outpatient Encounter Medications as of 11/26/2019  Medication Sig  . vancomycin (VANCOCIN) 125 MG capsule Take 1 capsule (125 mg total) by mouth 4 (four) times daily. X 10 day, then 3x per day x 7, then 2x per day until we see you  . cetirizine (ZYRTEC) 10 MG tablet Take 10 mg by mouth daily.  . cholestyramine (QUESTRAN) 4 g packet Take 1 packet (4 g total) by mouth daily.  . citalopram (CELEXA) 20 MG tablet Take 1 tablet (20 mg total) by mouth daily.  Marland Kitchen dicyclomine (BENTYL) 20 MG tablet Take 1 tablet (20 mg total) by mouth 3 (three) times daily as needed for spasms.  Marland Kitchen eletriptan (RELPAX) 40 MG tablet Take 1 tablet (40 mg total) by mouth as needed for migraine. may repeat in 2 hours if necessary  . estradiol (ESTRACE) 0.1 MG/GM vaginal cream Apply one half gram (pea-sized amount) on fingertip and place intravaginally three times weekly.  . fluconazole (DIFLUCAN) 150 MG tablet Take 150 mg by mouth once.  . Galcanezumab-gnlm (EMGALITY) 120 MG/ML SOAJ Inject 120 mg into the skin every 30 (thirty) days.  Marland Kitchen levothyroxine (SYNTHROID) 125 MCG tablet Take 0.5 tablets (62.5 mcg total) by mouth daily.  . meloxicam (MOBIC) 15 MG tablet Take by mouth.  . methocarbamol (ROBAXIN) 500 MG tablet Take 500 mg by mouth 4 (four) times daily.  . NONFORMULARY OR COMPOUNDED ITEM Lidocaine/diltiazem  cream apply prn anal fissure  . nystatin cream (MYCOSTATIN) APPLY EXTERNALLY TO THE AFFECTED AREA TWICE DAILY FOR 14 DAYS  . rosuvastatin (CRESTOR) 20 MG tablet Take 1 tablet (20 mg total) by mouth daily.  Marland Kitchen saccharomyces boulardii (FLORASTOR) 250 MG capsule Take 1 capsule (250 mg total) by mouth 2 (two) times daily.  . traMADol (ULTRAM) 50 MG tablet Take 2 tablets (100 mg total) by mouth 2 (two) times daily. (Patient taking differently: Take 100 mg by mouth as needed. )  . zolpidem (AMBIEN) 5 MG tablet Take 1 tablet (5 mg total) by mouth at bedtime as needed for sleep.   No facility-administered encounter medications on file as of 11/26/2019.     Patient Active Problem List   Diagnosis Date Noted  . Hypokalemia due to excessive gastrointestinal loss of potassium 07/28/2019  . 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 DDD (degenerative disc disease), cervical 11/18/2018  . DDD (degenerative disc disease), lumbar 11/18/2018  . Sicca syndrome ([place]) 11/18/2018  . Chronic contact dermatitis 08/06/2018  . Vitamin D deficiency 05/08/2017  . Vasomotor symptoms due to menopause 04/19/2017  . Periodic limb movement sleep disorder 03/28/2017  . Obesity (BMI 30.0-34.9) 01/29/2017  . Other insomnia 11/09/2016  . Irritable bowel syndrome with diarrhea 04/03/2016  . Left ventricular hypertrophy, mild 02/25/2016  . 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 Post concussive syndrome 07/14/2015  . Depression (NOS) 07/02/2015  .  Allergic rhinoconjunctivitis 07/02/2015  . Fibromyalgia syndrome 07/01/2015  . History of Clostridium difficile colitis, persistent 07/01/2015  . Essential hypertension 05/15/2013  . Hypothyroidism 05/15/2013  . Hyperlipidemia 05/15/2013  . Interstitial cystitis 05/15/2013  . Chronic migraine without aura 05/15/2013  . Spondylosis of lumbar region without myelopathy or radiculopathy 10/30/2011     Health Maintenance Due  Topic Date Due  . PAP  SMEAR-Modifier  09/11/2017     Review of Systems + weight loss at first, but now normalized. Energy level is still low. +diarrhea, bloating. 12 point ros is otherwise negative  Physical Exam   BP 121/87   Pulse 94   Wt 203 lb (92.1 kg)   BMI 32.27 kg/m   gen = a x o by 3 in nad Abd= soft, decreased bowel sound ,mildly distended, no guarding Ext= c/c/e  f CBC Lab Results  Component Value Date   WBC 6.6 07/05/2019   RBC 4.43 07/05/2019   HGB 13.3 07/05/2019   HCT 42.1 07/05/2019   PLT 271 07/05/2019   MCV 95.0 07/05/2019   MCH 30.0 07/05/2019   MCHC 31.6 07/05/2019   RDW 13.8 07/05/2019   LYMPHSABS 2.5 05/07/2018   MONOABS 0.7 03/09/2018   EOSABS 0.2 05/07/2018    BMET Lab Results  Component Value Date   NA 140 07/24/2019   K 4.6 07/24/2019   CL 103 07/24/2019   CO2 21 07/24/2019   GLUCOSE 78 07/24/2019   BUN 17 07/24/2019   CREATININE 0.93 07/24/2019   CALCIUM 9.8 07/24/2019   GFRNONAA 65 07/24/2019   GFRAA 75 07/24/2019    Review of outside records = + cdiff via [facility] -   Assessment and Plan  Anal pruritis = continue with topical creams  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 Plan to treat with fidaxomicin concurrent( $30 copay). Let's get copy for [facility] record for last positive cdifficile test  Anal fissure = continue with steroids  Spent 30 min with patient discussing treatment options

## 2019-11-27 ENCOUNTER — Other Ambulatory Visit: Payer: Self-pay | Admitting: Family Medicine

## 2019-11-27 DIAGNOSIS — Z1231 Encounter for screening mammogram for malignant neoplasm of breast: Secondary | ICD-10-CM

## 2019-12-01 ENCOUNTER — Other Ambulatory Visit: Payer: Self-pay | Admitting: Pharmacist

## 2019-12-01 DIAGNOSIS — A0471 Enterocolitis due to Clostridium difficile, recurrent: Secondary | ICD-10-CM

## 2019-12-01 MED ORDER — ZINPLAVA 1000 MG/40ML IV SOLN: 921.0000 mg | Freq: Once | INTRAVENOUS | 0 refills | Status: AC

## 2019-12-01 MED ORDER — FIDAXOMICIN 200 MG PO TABS: 200.0000 mg | ORAL_TABLET | Freq: Two times a day (BID) | ORAL | 0 refills | Status: DC

## 2019-12-01 MED FILL — DIFICID 200 MG TABLET: 200 | 10 days supply | Qty: 20 | Fill #0

## 2019-12-01 MED FILL — ZINPLAVA 1000 MG/40ML SOLN: 1000 | 30 days supply | Qty: 40 | Fill #0

## 2019-12-01 NOTE — Progress Notes (Signed)
Sending Dificid and Zinplava Rx to [facility] for patient's recurrent c diff infection. She is approved to receive each medication. [place] will order and then we will set her up for the infusion at short stay or another infusion center as an outpatient. Patient aware, Inez Catalina is currently helping with this.

## 2019-12-01 NOTE — Telephone Encounter (Signed)
[facility] Patient Advocate Encounter   Received notification from [facility] that prior authorization for [facility] is required.   PA submitted on 12/01/2019 Key [id number] Status is pending    [facility] will continue to follow.   Venida Jarvis. Nadara Mustard [place] Patient [facility] Phone: [phone] Fax:  [phone]

## 2019-12-02 ENCOUNTER — Telehealth: Payer: Self-pay | Admitting: Pharmacist

## 2019-12-02 NOTE — Telephone Encounter (Signed)
Spoke with patient regarding side effects and administration of Zinplava. Explained that it is a 60 minute infusion and the most common side effects are infusion reactions which include nausea, headache, mild fever, fatigue, and dizziness. Some side effects that could last past the infusion include headache and nausea.   She was originally scheduled for the infusion on Thursday 3/25 but is going out of town this weekend and wishes to reschedule for early next week. Sharyn Lull will help coordinate. The vials of Zinplava and her Dificid Rx will be sent to short stay from [facility]. Jimmy Footman, inpatient ID pharmacist, is aware and Inez Catalina is helping to set this up.

## 2019-12-02 NOTE — Telephone Encounter (Signed)
Patient rescheduled zinplava infusion for 4/1 at 10:00. Will need to send written orders for infusion to short stay.

## 2019-12-04 ENCOUNTER — Encounter (HOSPITAL_COMMUNITY): Payer: BC Managed Care – PPO

## 2019-12-05 ENCOUNTER — Telehealth: Payer: Self-pay

## 2019-12-05 NOTE — Telephone Encounter (Signed)
Received call today from Short stay requesting orders for infusion. Will forward message to pharmacy team and MD to advise on orders. Will need to fax before patient's appointment. [place]

## 2019-12-08 ENCOUNTER — Telehealth: Payer: Self-pay | Admitting: *Deleted

## 2019-12-08 DIAGNOSIS — F3289 Other specified depressive episodes: Secondary | ICD-10-CM

## 2019-12-08 MED ORDER — CITALOPRAM HYDROBROMIDE 20 MG PO TABS: 20.0000 mg | ORAL_TABLET | Freq: Every day | ORAL | 0 refills | Status: DC

## 2019-12-08 NOTE — Telephone Encounter (Signed)
Last Visit: 11/12/19 Next Visit: due September 2021. Message sent to the front to schedule patient.   Okay to refill per Dr. Estanislado Pandy

## 2019-12-08 NOTE — Telephone Encounter (Signed)
Written orders provided and given to Friend.

## 2019-12-08 NOTE — Telephone Encounter (Signed)
Please schedule patient for a follow up visit. Patient due September 2021. Thanks!   

## 2019-12-09 ENCOUNTER — Telehealth: Payer: Self-pay | Admitting: Rheumatology

## 2019-12-09 NOTE — Telephone Encounter (Signed)
Patient called stating her new prescription of Zolpidem 5 mg tablets has no refills.  Patient states she had an appointment on 11/12/19 and is not sure why the prescription was written with no refills.  Please advise.

## 2019-12-09 NOTE — Telephone Encounter (Signed)
Patient advised that the providers only send in a 30 day supply on controlled substances with no additional refills. Patient advised she may reach out through my chart or by phone when she is due for a refill.

## 2019-12-10 ENCOUNTER — Other Ambulatory Visit (HOSPITAL_COMMUNITY): Payer: Self-pay | Admitting: *Deleted

## 2019-12-11 ENCOUNTER — Other Ambulatory Visit: Payer: Self-pay

## 2019-12-11 ENCOUNTER — Ambulatory Visit (HOSPITAL_COMMUNITY)
Admission: RE | Admit: 2019-12-11 | Discharge: 2019-12-11 | Disposition: A | Discharge status: Home / Self Care | Payer: BC Managed Care – PPO | Source: Ambulatory Visit | Attending: Internal Medicine | Admitting: Internal Medicine

## 2019-12-11 DIAGNOSIS — A0471 Enterocolitis due to Clostridium difficile, recurrent: Secondary | ICD-10-CM | POA: Diagnosis not present

## 2019-12-11 MED ORDER — SODIUM CHLORIDE 0.9 % IV SOLN
10.0000 mg/kg | Freq: Once | INTRAVENOUS | Status: AC
  Administered 2019-12-11: 921 mg via INTRAVENOUS
  Filled 2019-12-11: qty 36.84

## 2019-12-15 ENCOUNTER — Telehealth: Payer: Self-pay | Admitting: Rheumatology

## 2019-12-15 MED ORDER — ZOLPIDEM TARTRATE 5 MG PO TABS: 5.0000 mg | ORAL_TABLET | Freq: Every evening | ORAL | 0 refills | Status: DC | PRN

## 2019-12-15 NOTE — Telephone Encounter (Signed)
Last Visit: 11/12/19 Next Visit: 05/18/20  Last Fill 11/12/19  Okay to refill Ambien?

## 2019-12-15 NOTE — Telephone Encounter (Signed)
Patient called requesting prescription refill of Zolpidem to be sent to [facility] at [address].

## 2019-12-16 ENCOUNTER — Ambulatory Visit
Admission: RE | Admit: 2019-12-16 | Discharge: 2019-12-16 | Disposition: A | Discharge status: Home / Self Care | Payer: BC Managed Care – PPO | Source: Ambulatory Visit | Attending: Family Medicine | Admitting: Family Medicine

## 2019-12-16 ENCOUNTER — Other Ambulatory Visit: Payer: Self-pay

## 2019-12-16 DIAGNOSIS — Z1231 Encounter for screening mammogram for malignant neoplasm of breast: Secondary | ICD-10-CM

## 2020-01-07 ENCOUNTER — Ambulatory Visit: Payer: BC Managed Care – PPO | Admitting: Internal Medicine

## 2020-01-07 ENCOUNTER — Encounter: Payer: Self-pay | Admitting: Internal Medicine

## 2020-01-07 ENCOUNTER — Other Ambulatory Visit: Payer: Self-pay

## 2020-01-07 VITALS — BP 113/84 | HR 99 | Temp 98.3°F | Wt 207.6 lb

## 2020-01-07 DIAGNOSIS — A0471 Enterocolitis due to Clostridium difficile, recurrent: Secondary | ICD-10-CM

## 2020-01-07 NOTE — Progress Notes (Signed)
RFV: follow up for cdiff  Patient ID: Molly Giles, female   DOB: 1955-09-27, 64 y.o.   MRN: [id number]  HPI Had recurrent cdifficile, received zinplava infusion on 4/1. Finished dificid 2 weeks ago. No recurrence  Outpatient Encounter Medications as of 01/07/2020  Medication Sig  . cetirizine (ZYRTEC) 10 MG tablet Take 10 mg by mouth daily.  . cholestyramine (QUESTRAN) 4 g packet Take 1 packet (4 g total) by mouth daily.  . citalopram (CELEXA) 20 MG tablet Take 1 tablet (20 mg total) by mouth daily.  Marland Kitchen eletriptan (RELPAX) 40 MG tablet Take 1 tablet (40 mg total) by mouth as needed for migraine. may repeat in 2 hours if necessary  . estradiol (ESTRACE) 0.1 MG/GM vaginal cream Apply one half gram (pea-sized amount) on fingertip and place intravaginally three times weekly.  . Galcanezumab-gnlm (EMGALITY) 120 MG/ML SOAJ Inject 120 mg into the skin every 30 (thirty) days.  Marland Kitchen levothyroxine (SYNTHROID) 125 MCG tablet Take 0.5 tablets (62.5 mcg total) by mouth daily.  . meloxicam (MOBIC) 15 MG tablet Take by mouth.  . methocarbamol (ROBAXIN) 500 MG tablet Take 500 mg by mouth 4 (four) times daily.  . rosuvastatin (CRESTOR) 20 MG tablet Take 1 tablet (20 mg total) by mouth daily.  Marland Kitchen saccharomyces boulardii (FLORASTOR) 250 MG capsule Take 1 capsule (250 mg total) by mouth 2 (two) times daily.  . traMADol (ULTRAM) 50 MG tablet Take 2 tablets (100 mg total) by mouth 2 (two) times daily. (Patient taking differently: Take 100 mg by mouth as needed. )  . zolpidem (AMBIEN) 5 MG tablet Take 1 tablet (5 mg total) by mouth at bedtime as needed for sleep.  Marland Kitchen dicyclomine (BENTYL) 20 MG tablet Take 1 tablet (20 mg total) by mouth 3 (three) times daily as needed for spasms.  . fidaxomicin (DIFICID) 200 MG TABS tablet Take 1 tablet (200 mg total) by mouth 2 (two) times daily. (Patient not taking: Reported on 01/07/2020)  . fluconazole (DIFLUCAN) 150 MG tablet Take 150 mg by mouth once.  . NONFORMULARY OR  COMPOUNDED ITEM Lidocaine/diltiazem cream apply prn anal fissure  . nystatin cream (MYCOSTATIN) APPLY EXTERNALLY TO THE AFFECTED AREA TWICE DAILY FOR 14 DAYS  . vancomycin (VANCOCIN) 125 MG capsule Take 1 capsule (125 mg total) by mouth 4 (four) times daily. X 10 day, then 3x per day x 7, then 2x per day until we see you (Patient not taking: Reported on 01/07/2020)   No facility-administered encounter medications on file as of 01/07/2020.     Patient Active Problem List   Diagnosis Date Noted  . Hypokalemia due to excessive gastrointestinal loss of potassium 07/28/2019  . 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 DDD (degenerative disc disease), cervical 11/18/2018  . DDD (degenerative disc disease), lumbar 11/18/2018  . Sicca syndrome ([facility]) 11/18/2018  . Chronic contact dermatitis 08/06/2018  . Vitamin D deficiency 05/08/2017  . Vasomotor symptoms due to menopause 04/19/2017  . Periodic limb movement sleep disorder 03/28/2017  . Obesity (BMI 30.0-34.9) 01/29/2017  . Other insomnia 11/09/2016  . Irritable bowel syndrome with diarrhea 04/03/2016  . Left ventricular hypertrophy, mild 02/25/2016  . 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 Post concussive syndrome 07/14/2015  . Depression (NOS) 07/02/2015  . Allergic rhinoconjunctivitis 07/02/2015  . Fibromyalgia syndrome 07/01/2015  . History of Clostridium difficile colitis, persistent 07/01/2015  . Essential hypertension 05/15/2013  . Hypothyroidism 05/15/2013  . Hyperlipidemia 05/15/2013  . Interstitial cystitis 05/15/2013  . Chronic migraine without  aura 05/15/2013  . Spondylosis of lumbar region without myelopathy or radiculopathy 10/30/2011     Health Maintenance Due  Topic Date Due  . COVID-19 Vaccine (1) Never done  . PAP SMEAR-Modifier  09/11/2017     Review of Systems  Physical Exam   BP 113/84   Pulse 99   Temp 98.3 F (36.8 C) (Oral)   Wt 207 lb 9.6 oz (94.2 kg)   BMI 33.01 kg/m    No  results found for: CD4TCELL No results found for: CD4TABS No results found for: HIV1RNAQUANT No results found for: HEPBSAB No results found for: RPR, LABRPR  CBC Lab Results  Component Value Date   WBC 6.6 07/05/2019   RBC 4.43 07/05/2019   HGB 13.3 07/05/2019   HCT 42.1 07/05/2019   PLT 271 07/05/2019   MCV 95.0 07/05/2019   MCH 30.0 07/05/2019   MCHC 31.6 07/05/2019   RDW 13.8 07/05/2019   LYMPHSABS 2.5 05/07/2018   MONOABS 0.7 03/09/2018   EOSABS 0.2 05/07/2018    BMET Lab Results  Component Value Date   NA 140 07/24/2019   K 4.6 07/24/2019   CL 103 07/24/2019   CO2 21 07/24/2019   GLUCOSE 78 07/24/2019   BUN 17 07/24/2019   CREATININE 0.93 07/24/2019   CALCIUM 9.8 07/24/2019   GFRNONAA 65 07/24/2019   GFRAA 75 07/24/2019      Assessment and Plan   Doing well Avoid unnecessary abtx rtc prn

## 2020-01-14 ENCOUNTER — Other Ambulatory Visit: Payer: Self-pay | Admitting: *Deleted

## 2020-01-14 MED ORDER — ZOLPIDEM TARTRATE 5 MG PO TABS: 5.0000 mg | ORAL_TABLET | Freq: Every evening | ORAL | 0 refills | Status: DC | PRN

## 2020-01-14 NOTE — Telephone Encounter (Signed)
Refill request received via fax  Last Visit: 11/12/19 Next Visit: 05/18/20  Last Fill 01/14/20  Okay to refill Ambien?

## 2020-01-20 ENCOUNTER — Telehealth: Payer: Self-pay

## 2020-01-20 NOTE — Telephone Encounter (Signed)
Patient called office today stating diarrhea has returned starting yesterday. Has also noticed rash that has broke out all over her body; beliebes this could be related to [place].  Patient has a 10 day trip this upcoming Monday. Would like to know if office could see her before then. Also what she should do regarding diarrhea.  [place]

## 2020-01-20 NOTE — Telephone Encounter (Signed)
In terms of the diarrhea. Can you have her drop off specimen to get tested for cdifficile. For rash, try to do topical over the counter creams to see if it improves for now and we will see if can get her in on thurs

## 2020-01-20 NOTE — Telephone Encounter (Signed)
Attempted to call patient to see if she would be able to pickup stool kit for c.diff. Patient was not able to take call, voicemail is full and not accepting new messages. [place]

## 2020-01-21 NOTE — Telephone Encounter (Signed)
Spoke with patient on 5/11 regarding stool kit and follow up appt. Patient is okay with picking up stool kit and will call office today with updates on how she is feeling.  Molly Giles

## 2020-01-29 ENCOUNTER — Ambulatory Visit: Payer: BC Managed Care – PPO

## 2020-02-04 ENCOUNTER — Other Ambulatory Visit: Payer: BC Managed Care – PPO

## 2020-02-11 ENCOUNTER — Other Ambulatory Visit: Payer: BC Managed Care – PPO

## 2020-02-11 ENCOUNTER — Other Ambulatory Visit: Payer: Self-pay | Admitting: *Deleted

## 2020-02-11 MED ORDER — ZOLPIDEM TARTRATE 5 MG PO TABS: 5.0000 mg | ORAL_TABLET | Freq: Every evening | ORAL | 0 refills | Status: DC | PRN

## 2020-02-11 NOTE — Telephone Encounter (Signed)
Refill request received via fax  Last Visit: 11/12/19 Next Visit: 05/18/20  Last Fill 01/14/20  Okay to refill Ambien?

## 2020-02-12 ENCOUNTER — Other Ambulatory Visit: Payer: BC Managed Care – PPO | Admitting: *Deleted

## 2020-02-12 ENCOUNTER — Other Ambulatory Visit: Payer: Self-pay

## 2020-02-12 DIAGNOSIS — E785 Hyperlipidemia, unspecified: Secondary | ICD-10-CM

## 2020-02-12 LAB — LIPID PANEL
Chol/HDL Ratio: 3.1 ratio (ref 0.0–4.4)
Cholesterol, Total: 181 mg/dL (ref 100–199)
HDL: 59 mg/dL (ref >39)
LDL Chol Calc (NIH): 99 mg/dL (ref 0–99)
Triglycerides: 129 mg/dL (ref 0–149)
VLDL Cholesterol Cal: 23 mg/dL (ref 5–40)

## 2020-02-12 LAB — HEPATIC FUNCTION PANEL
ALT: 18 IU/L (ref 0–32)
AST: 22 IU/L (ref 0–40)
Albumin: 4.7 g/dL (ref 3.8–4.8)
Alkaline Phosphatase: 79 IU/L (ref 48–121)
Bilirubin Total: 0.5 mg/dL (ref 0.0–1.2)
Bilirubin, Direct: 0.14 mg/dL (ref 0.00–0.40)
Total Protein: 7.1 g/dL (ref 6.0–8.5)

## 2020-02-13 ENCOUNTER — Telehealth: Payer: Self-pay

## 2020-02-13 DIAGNOSIS — E785 Hyperlipidemia, unspecified: Secondary | ICD-10-CM

## 2020-02-13 MED ORDER — ROSUVASTATIN CALCIUM 40 MG PO TABS: ORAL_TABLET | ORAL | 3 refills | Status: DC

## 2020-02-13 NOTE — Telephone Encounter (Signed)
Per Dr. Johnsie Cancel, LDL above target of 70 or less with relatively high calcium score for age Would alternate 40 mg crestor with 20 mg and f/u labs in 3 months. Patient agreed to try new dosing. Patient will call back if she has any issues with taking higher dose. Patient will come in on 05/19/20 for repeat lab work.

## 2020-02-13 NOTE — Telephone Encounter (Signed)
-----   Message from Josue Hector, MD sent at 02/12/2020  4:08 PM EDT ----- LDL above target of 70 or less with relatively high calcium score for age Would alternate 40 mg crestor with 20 mg and f/u labs in 3 months

## 2020-03-05 ENCOUNTER — Other Ambulatory Visit: Payer: Self-pay | Admitting: *Deleted

## 2020-03-05 DIAGNOSIS — F3289 Other specified depressive episodes: Secondary | ICD-10-CM

## 2020-03-05 MED ORDER — CITALOPRAM HYDROBROMIDE 20 MG PO TABS: 20.0000 mg | ORAL_TABLET | Freq: Every day | ORAL | 0 refills | Status: DC

## 2020-03-05 NOTE — Telephone Encounter (Signed)
Refill request received via fax  Last Visit: 11/12/19 Next Visit: 05/18/20  Okay to refill per Dr. Estanislado Pandy

## 2020-03-16 ENCOUNTER — Telehealth: Payer: Self-pay | Admitting: Rheumatology

## 2020-03-16 ENCOUNTER — Other Ambulatory Visit: Payer: Self-pay | Admitting: *Deleted

## 2020-03-16 MED ORDER — ZOLPIDEM TARTRATE 5 MG PO TABS: 5.0000 mg | ORAL_TABLET | Freq: Every evening | ORAL | 0 refills | Status: DC | PRN

## 2020-03-16 NOTE — Telephone Encounter (Signed)
Attempted to contact the patient and left message to advise patient the prescription is in Dr. Arlean Hopping box awaiting to be sent to the pharmacy which should be done today. See Rx note for further detail .

## 2020-03-16 NOTE — Telephone Encounter (Signed)
Patient called checking the status of her prescription refill of Zolpidem.  Patient states [facility] is waiting on authorization from Dr. Estanislado Pandy.  Please advise.

## 2020-03-16 NOTE — Telephone Encounter (Signed)
Refill request received via fax  Last Visit: 11/12/19 Next Visit: 05/18/20  Last Fill: 02/11/2020  Okay to refill Ambien?

## 2020-03-24 ENCOUNTER — Telehealth: Payer: Self-pay | Admitting: Family Medicine

## 2020-03-24 NOTE — Telephone Encounter (Signed)
I called pt. She will be changing to [employer] she believes.  She has sumatriptan and rizatriptan would be the preferred triptans.  She would like to try the rizatriptan and is this works great, if not will get eletriptan thru good rx to keep her premium cost lower.  I relayed that definitely cheaper to get preferred drugs.  I told her that it is up to provider that prescribes medication but did not anticipate a problem with her getting that prescribed.  nurtec and ubrelvy did not really work for her.  Emgality helps.  She has tried amerge, sumatriptan too.

## 2020-03-24 NOTE — Telephone Encounter (Signed)
Pt is asking for a call to discuss a medication in place of her eletriptan (RELPAX) 40 MG tablet due to switching over to Medicare. eletriptan (RELPAX) 40 MG tablet will not be covered under her Medicare, please call.

## 2020-04-01 ENCOUNTER — Telehealth: Payer: Self-pay | Admitting: *Deleted

## 2020-04-01 ENCOUNTER — Telehealth: Payer: Self-pay | Admitting: Rheumatology

## 2020-04-01 NOTE — Telephone Encounter (Signed)
Attempted to contact patient and left message for patient to call the office.  

## 2020-04-01 NOTE — Telephone Encounter (Signed)
Patient left a voicemail requesting prescription refill of Zolpidem.  Patient states the pharmacy said it is too soon to refill.  Patient states she is completely out.  Patient states her last prescription only had 15 tablets and she takes 30 tablets per month.

## 2020-04-01 NOTE — Telephone Encounter (Signed)
Submitted a Prior Authorization request to [facility] for Ambien via Cover My Meds. Will update once we receive a response.

## 2020-04-01 NOTE — Telephone Encounter (Signed)
Patient returned call to the office and states she was only given 15 tablets of Ambien from the pharmacy. Patient advised she should contact the pharmacy and discuss with pharmacist as the prescription written and sent on 03/16/2020 was for 30 tablets. Patient states she will contact the pharmacy.

## 2020-04-16 ENCOUNTER — Other Ambulatory Visit: Payer: Self-pay | Admitting: Physician Assistant

## 2020-04-16 NOTE — Telephone Encounter (Signed)
Last Visit: 11/12/19 Next Visit: 05/18/20  Last Fill: 03/16/2020  Okay to refill Ambien?

## 2020-04-26 DIAGNOSIS — M7061 Trochanteric bursitis, right hip: Secondary | ICD-10-CM

## 2020-04-26 HISTORY — DX: Trochanteric bursitis, right hip: M70.61

## 2020-05-05 ENCOUNTER — Telehealth: Payer: Self-pay

## 2020-05-05 NOTE — Telephone Encounter (Signed)
Patient left voicemail on triage line stating her endodontist would like to prescribe Z pack to help with inflammation in gums and nerves. States she is currently having episodes of loose stool for two weeks. Occassionally has 3 episodes a day. States loose stool had a foul odor.  Would like to know if MD is okay with her taking Z pack or if she should hold off. Is complaining of dental pain currently. Will forward message to MD to advise. [place]

## 2020-05-06 NOTE — Progress Notes (Deleted)
Office Visit Note  Patient: Molly Giles             Date of Birth: 20-May-1956           MRN: [id number]             PCP: McDiarmid, Blane Ohara, MD Referring: McDiarmid, Blane Ohara, MD Visit Date: 05/18/2020 Occupation: @GUAROCC @  Subjective:  No chief complaint on file.   History of Present Illness: Molly Giles is a 64 y.o. female ***   Activities of Daily Living:  Patient reports morning stiffness for *** {minute/hour:19697}.   Patient {ACTIONS;DENIES/REPORTS:21021675::"Denies"} nocturnal pain.  Difficulty dressing/grooming: {ACTIONS;DENIES/REPORTS:21021675::"Denies"} Difficulty climbing stairs: {ACTIONS;DENIES/REPORTS:21021675::"Denies"} Difficulty getting out of chair: {ACTIONS;DENIES/REPORTS:21021675::"Denies"} Difficulty using hands for taps, buttons, cutlery, and/or writing: {ACTIONS;DENIES/REPORTS:21021675::"Denies"}  No Rheumatology ROS completed.   PMFS History:  Patient Active Problem List   Diagnosis Date Noted  . Hypokalemia due to excessive gastrointestinal loss of potassium 07/28/2019  . 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 DDD (degenerative disc disease), cervical 11/18/2018  . DDD (degenerative disc disease), lumbar 11/18/2018  . Sicca syndrome ([place]) 11/18/2018  . Chronic contact dermatitis 08/06/2018  . Vitamin D deficiency 05/08/2017  . Vasomotor symptoms due to menopause 04/19/2017  . Periodic limb movement sleep disorder 03/28/2017  . Obesity (BMI 30.0-34.9) 01/29/2017  . Other insomnia 11/09/2016  . Irritable bowel syndrome with diarrhea 04/03/2016  . Left ventricular hypertrophy, mild 02/25/2016  . 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 Post concussive syndrome 07/14/2015  . Depression (NOS) 07/02/2015  . Allergic rhinoconjunctivitis 07/02/2015  . Fibromyalgia syndrome 07/01/2015  . History of Clostridium difficile colitis, persistent 07/01/2015  . Essential hypertension 05/15/2013  . Hypothyroidism 05/15/2013  .  Hyperlipidemia 05/15/2013  . Interstitial cystitis 05/15/2013  . Chronic migraine without aura 05/15/2013  . Spondylosis of lumbar region without myelopathy or radiculopathy 10/30/2011    Past Medical History:  Diagnosis Date  . Allergic rhinoconjunctivitis 07/02/2015  . Arthritis   . Benign positional vertigo 04/2015   Responded well to [facility]  . Bruxism (teeth grinding)   . Chronic contact dermatitis 08/06/2018   Per allergist Dr Pablo Lawrence.   . Chronic migraine 02/25/2016   Per Dr Jaynee Eagles review in notes from [facility] from September 2015. Showed total headache days last month 18. Severe headache days 7 days. Moderate headache days 5 days. Mild headache days last month sick days. Days without headache last month 10 days. Symptoms associated with photophobia, phonophobia, osmophobia, neck pain, dizziness, jaw pain, nasal congestion, vision disturbances, tingling and numbness, weakness and worsening with activity. Each headache attack last 3 hours depending on treatment in severity. Left side, the right side, easier side, the frontal area in the back of the head. Characterized as throbbing, pressure, tightness, squeezing, stabbing and burning   . Chronic migraine without aura 05/15/2013    Dr Jaynee Eagles [facility]  . DDD (degenerative disc disease), cervical 11/18/2018  . DDD (degenerative disc disease), lumbar   . Depression   . Disc displacement, lumbar   . Dysrhythmia    seen by dr Wynonia Lawman- not a problem since she has been on Bystolic  . Episodic cluster headache, not intractable 03/06/2017  . Essential hypertension 05/15/2013  . Family history of adverse reaction to anesthesia    Brother- N/V  . Family history of premature CAD 05/15/2013  . Fibromyalgia syndrome 07/01/2015   Management by Dr Chauncey Cruel. Devashwar (Rhe[facility])   . GERD (gastroesophageal reflux disease) 12/16/2014  . H/O  seasonal allergies   . Hammer toe    Left great toe  . Hearing loss of  both ears 07/27/2015   mild to borderline moderate low frequency hearing loss improving to within normal limits bilaterally on audiology testing at [facility] in November 2016.    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 History of colonic polyps   . Hx of bad fall 02/2015   Severe Facial/head trauma without fracture  . 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 Hyperlipidemia 1998  . Hypothyroidism   . Impairment of balance 02/2015   Consequent of postconcussive syndrome  . Injury of triangular fibrocartilage complex of left wrist 02/25/2019   Dx 02/25/19 Iran Planas IV MD [facility])  . Insulin resistance 07/04/2017  . Interstitial cystitis   . Irritable bowel syndrome with diarrhea 04/03/2016  . Left ventricular hypertrophy, mild 02/25/2016   ECHOcardiogram report 06/17/15 showing EF55-60%, mild LVH and G1DD   . 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[place]) 03/10/2018   [facility]  . Lumbar facet joint pain   . 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 Mood disorder ([place])   . Morbid obesity ([place]) 03/06/2017  . Musculoskeletal neck pain 07/14/2015  . Nocturnal hypoxemia 03/06/2017  . Normal coronary arteries 05/14/2014  . Obesity (BMI 30.0-34.9) 01/29/2017  . Osteoarthritis of left hip 11/01/2015   MRI order by Dr Alvan Dame (ortho) 10/2015 showed significant arthritis of left hip joint with cystic changes in femoral head c/w osteoarthritis  . 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 Other insomnia 11/09/2016  . Overweight   . Pain in joint of left shoulder 11/09/2016  . Pain in joint, multiple sites 11/18/2018  . Palpitations 05/15/2013   [facility] manages  . Periodic limb movement sleep disorder 03/28/2017  . Perirectal cyst 05/07/2016  . PONV (postoperative nausea and vomiting)   . Positive ANA (antinuclear antibody) 11/18/2018  . Post concussion syndrome 06/06/2015  . Post  concussive syndrome 07/14/2015   Ms Kroeze's post-concussive syndrome manifesting in vertigo and headache, mood changes, poor balance, dizziness, and decreased concentration per Dr Jaynee Eagles at [facility].   Marland Kitchen Posterior vitreous detachment of right eye 2014  . S/P left THA, AA 07/25/2016  . Shingles   . Shortness of breath dyspnea    with exertion  . Sicca syndrome ([place]) 11/18/2018   (+) ANA  . Sleep walking and eating 03/06/2017  . Snoring 03/06/2017  . Spondylosis of lumbar region without myelopathy or radiculopathy 10/30/2011  . Thyroid nodule 08/11/2009   Findings: The thyroid gland is within normal limits in size.  The gland is diffusely inhomogeneous. A small solid nodule is noted in the lower pole  medially on the right of 7 x 6 x 8 mm. A small solid nodule is noted inferiorly on the left of 3 x 3 x 4 mm.  IMPRESSION:  The thyroid gland is within normal limits in size with only small solid nodules present, the largest of only 8 mm in diameter on the right.    . 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   . Vasomotor symptoms due to menopause 04/19/2017  . Vitamin D deficiency 05/08/2017  . Yeast vaginitis 07/11/2019    Family History  Problem Relation Age of Onset  . Alzheimer's disease Mother   . Hyperlipidemia Mother   . Hypertension Mother   . Osteoporosis Mother   .  Parkinson's disease Mother   . Migraines Sister   . Allergies Sister   . Hypertension Sister   . Hyperlipidemia Brother   . Cardiomyopathy Brother   . Diabetes type II Brother   . Kidney disease Brother   . Asthma Brother   . Heart disease Brother   . Hypertension Brother   . Hyperlipidemia Brother   . Kidney disease Brother   . Heart disease Father   . Hyperlipidemia Father   . Hypertension Father   . Aortic aneurysm Father   . Early death Father 71  . Diabetes type II Other   . Breast cancer Maternal Aunt    Past Surgical History:  Procedure  Laterality Date  . Bladder dilitation     x 3  . BREAST BIOPSY Right 2011   Benign histology  . CARDIOVASCULAR STRESS TEST  2000   Unremarkable per pt report  . CARPOMETACARPAL JOINT ARTHROTOMY Right 2011  . COLONOSCOPY    . COLONOSCOPY WITH PROPOFOL N/A 04/21/2015   Procedure: COLONOSCOPY WITH PROPOFOL;  Surgeon: Carol Ada, MD;  Location: [facility];  Service: Endoscopy;  Laterality: N/A;  . EPIDURAL BLOCK INJECTION Left 04/12/2016   Left Medial Nerve Block and Left L5 ramus block, Dr Suella Broad   . EPIDURAL BLOCK INJECTION  03/21/2016   Left L3-4 medial branch block and Left L5 & dorsal ramus block   . EPIDURAL BLOCK INJECTION N/A 10/25/2016   Suella Broad, MD. Lumbar medial branch block  . EPIDURAL BLOCK INJECTION N/A 02/09/2017   Suella Broad, MD.  Bilateral L3/4 medial branch block, bilateral L5 dorsal ramus block  . EPIDURAL BLOCK INJECTION N/A 07/04/2017   Suella Broad, MD  . FECAL TRANSPLANT  04/21/2015   Procedure: FECAL TRANSPLANT;  Surgeon: Carol Ada, MD;  Location: [facility];  Service: Endoscopy;;  . HIP ARTHROPLASTY Left   . HIP ARTHROSCOPY Left 03/06/2018   Left hip arthroplasty, redo for loose hip arthroplasty. Procedure at [facility] hospital  . INJECTION HIP INTRA ARTICULAR Left 11/2015   for OA by Dr Suella Broad  . OTHER SURGICAL HISTORY Left 2016   Left L3/L4 medial nerve block and Left L5 Dorsal Ramus block Dr Mickel Duhamel  . TOTAL HIP ARTHROPLASTY Left 07/25/2016   Procedure: LEFT TOTAL HIP ARTHROPLASTY ANTERIOR APPROACH;  Surgeon: Paralee Cancel, MD;  Location: [facility];  Service: Orthopedics;  Laterality: Left;   Social History   Social History Narrative   Prior PCP With [facility] [place], [place].   Married, lives with [place], (b. 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    Wears seatbelt usually   No religious beliefs affecting healthcare   No difficulty  taking medications as directed.       Home has working smoke alarm   No home throw rugs   Does not have nonslip bathtub / shower    Has railings on all stairs   Home is free from [place].    Right-handed.      No history of Hospitalization as of 07/2016.       Best number to reach patient [phone] (M) as of 07/01/15   It is permissible to leave messages as of 07/01/15 .    No regular exercise of 3 times a week for 30 minutes at a time      [place] Frech has Advanced Directive and a Doctor, general practice is husband,  Mollye Guinta [phone]      Caffeine: 2-3 cups coffee per day   Immunization History  Administered Date(s) Administered  . Influenza Inj Mdck Quad Pf 08/12/2018  . Influenza,inj,Quad PF,6+ Mos 07/01/2015, 05/10/2017, 06/26/2019  . Influenza-Unspecified 09/13/2016, 08/12/2018  . Tdap 11/09/2011, 02/24/2016, 09/13/2016  . Zoster Recombinat (Shingrix) 08/12/2018     Objective: Vital Signs: There were no vitals taken for this visit.   Physical Exam   Musculoskeletal Exam: ***  CDAI Exam: CDAI Score: -- Patient Global: --; Provider Global: -- Swollen: --; Tender: -- Joint Exam 05/18/2020   No joint exam has been documented for this visit   There is currently no information documented on the homunculus. Go to the Rheumatology activity and complete the homunculus joint exam.  Investigation: No additional findings.  Imaging: No results found.  Recent Labs: Lab Results  Component Value Date   WBC 6.6 07/05/2019   HGB 13.3 07/05/2019   PLT 271 07/05/2019   NA 140 07/24/2019   K 4.6 07/24/2019   CL 103 07/24/2019   CO2 21 07/24/2019   GLUCOSE 78 07/24/2019   BUN 17 07/24/2019   CREATININE 0.93 07/24/2019   BILITOT 0.5 02/12/2020   ALKPHOS 79 02/12/2020   AST 22 02/12/2020   ALT 18 02/12/2020   PROT 7.1 02/12/2020   ALBUMIN 4.7 02/12/2020   CALCIUM 9.8 07/24/2019   GFRAA 75 07/24/2019    Speciality Comments: No specialty comments  available.  Procedures:  No procedures performed Allergies: Sulfa antibiotics, Chlorhexidine gluconate, Codeine, Lyrica [pregabalin], Methylprednisolone, Prednisone, Betadine [povidone iodine], Latex, and Wellbutrin [bupropion]   Assessment / Plan:     Visit Diagnoses: Fibromyalgia  Other insomnia  Other fatigue  Primary osteoarthritis of both hands  Trochanteric bursitis of left hip  Status post left hip replacement  DDD (degenerative disc disease), cervical  DDD (degenerative disc disease), lumbar  Chronic pain syndrome  History of hyperlipidemia  History of migraine  IC (interstitial cystitis)  Vitamin D deficiency  History of hypothyroidism  History of hypertension  History of depression  Orders: No orders of the defined types were placed in this encounter.  No orders of the defined types were placed in this encounter.   Face-to-face time spent with patient was *** minutes. Greater than 50% of time was spent in counseling and coordination of care.  Follow-Up Instructions: No follow-ups on file.   Ofilia Neas, PA-C  Note - This record has been created using Dragon software.  Chart creation errors have been sought, but may not always  have been located. Such creation errors do not reflect on  the standard of medical care.

## 2020-05-14 ENCOUNTER — Other Ambulatory Visit: Payer: Self-pay | Admitting: Rheumatology

## 2020-05-14 NOTE — Telephone Encounter (Signed)
Last Visit: 11/12/2019 Next Visit: 06/22/2020  Last fill: 04/16/2020  Okay to refill Lorrin Mais?

## 2020-05-14 NOTE — Telephone Encounter (Signed)
Patient called requesting refill on Zolpidem. Patient has two pills left, and wanted to try to get refill today because of the holiday.

## 2020-05-18 ENCOUNTER — Ambulatory Visit: Payer: BC Managed Care – PPO | Admitting: Physician Assistant

## 2020-05-19 ENCOUNTER — Other Ambulatory Visit: Payer: Medicare Other

## 2020-05-19 ENCOUNTER — Other Ambulatory Visit: Payer: Self-pay

## 2020-05-24 ENCOUNTER — Telehealth: Payer: Self-pay | Admitting: Cardiovascular Disease

## 2020-05-24 NOTE — Telephone Encounter (Signed)
Will see if medical records has received any lab results from this patient.

## 2020-05-24 NOTE — Telephone Encounter (Signed)
Patient states she dropped off lab results last week Wednesday.  She wants to make sure Dr. Johnsie Cancel has seen those.

## 2020-06-05 ENCOUNTER — Other Ambulatory Visit: Payer: Self-pay | Admitting: Rheumatology

## 2020-06-05 DIAGNOSIS — F3289 Other specified depressive episodes: Secondary | ICD-10-CM

## 2020-06-07 NOTE — Telephone Encounter (Signed)
Last Visit: 11/12/2019 Next Visit: 06/22/2020  Current Dose per office note 11/12/2019: Celexa as prescribed DX: Fibromyalgia  Okay to refill Celexa per Dr. Estanislado Pandy.

## 2020-06-08 NOTE — Progress Notes (Signed)
Office Visit Note  Patient: Molly Giles             Date of Birth: [birth year]           MRN: [id number]             PCP: McDiarmid, Blane Ohara, MD Referring: McDiarmid, Blane Ohara, MD Visit Date: 06/22/2020 Occupation: @GUAROCC @  Subjective:  Insomnia   History of Present Illness: Molly Giles is a 64 y.o. female with history of fibromyalgia, osteoarthritis, and DDD.   She states overall her discomfort due to fibromyalgia has been tolerable.  She has ongoing discomfort due to trochanteric bursitis bilaterally.  She has been going to PT twice a week to work on ROM and strengthening hip exercises. She states both hip replacements are doing better.  She states she had a fall recently, which she states felt like a setback but she has been working on strengthening exercises.  She continues to take meloxicam 15 mg 1 tablet by mouth daily as needed for pain relief.  She has chronic fatigue secondary to insomnia.  She takes melatonin and ambien 5 mg 1 tablet by mouth at bedtime for insomnia.      Activities of Daily Living:  Patient reports morning stiffness for 30 minutes.   Patient Reports nocturnal pain.  Difficulty dressing/grooming: Denies Difficulty climbing stairs: Reports Difficulty getting out of chair: Denies Difficulty using hands for taps, buttons, cutlery, and/or writing: Denies  Review of Systems  Constitutional: Positive for fatigue.  HENT: Positive for mouth sores, mouth dryness and nose dryness.   Eyes: Positive for dryness. Negative for pain and visual disturbance.  Respiratory: Negative for cough, hemoptysis, shortness of breath and difficulty breathing.   Cardiovascular: Negative for chest pain, palpitations, hypertension and swelling in legs/feet.  Gastrointestinal: Negative for blood in stool, constipation and diarrhea.  Endocrine: Negative for increased urination.  Genitourinary: Negative for painful urination.  Musculoskeletal: Positive for arthralgias, joint  pain, myalgias, muscle weakness, morning stiffness, muscle tenderness and myalgias. Negative for joint swelling.  Skin: Negative for color change, pallor, rash, hair loss, nodules/bumps, skin tightness, ulcers and sensitivity to sunlight.  Allergic/Immunologic: Positive for susceptible to infections.  Neurological: Positive for dizziness and headaches. Negative for numbness and weakness.  Hematological: Negative for swollen glands.  Psychiatric/Behavioral: Positive for sleep disturbance. Negative for depressed mood. The patient is not nervous/anxious.     PMFS History:  Patient Active Problem List   Diagnosis Date Noted  . Hypokalemia due to excessive gastrointestinal loss of potassium 07/28/2019  . 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 DDD (degenerative disc disease), cervical 11/18/2018  . DDD (degenerative disc disease), lumbar 11/18/2018  . Sicca syndrome ([place]) 11/18/2018  . Chronic contact dermatitis 08/06/2018  . Vitamin D deficiency 05/08/2017  . Vasomotor symptoms due to menopause 04/19/2017  . Periodic limb movement sleep disorder 03/28/2017  . Obesity (BMI 30.0-34.9) 01/29/2017  . Other insomnia 11/09/2016  . Irritable bowel syndrome with diarrhea 04/03/2016  . Left ventricular hypertrophy, mild 02/25/2016  . 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 Post concussive syndrome 07/14/2015  . Depression (NOS) 07/02/2015  . Allergic rhinoconjunctivitis 07/02/2015  . Fibromyalgia syndrome 07/01/2015  . History of Clostridium difficile colitis, persistent 07/01/2015  . Essential hypertension 05/15/2013  . Hypothyroidism 05/15/2013  . Hyperlipidemia 05/15/2013  . Interstitial cystitis 05/15/2013  . Chronic migraine without aura 05/15/2013  . Spondylosis of lumbar region without myelopathy or radiculopathy 10/30/2011    Past Medical History:  Diagnosis  Date  . Allergic rhinoconjunctivitis 07/02/2015  . Arthritis   . Benign positional vertigo 04/2015    Responded well to [facility]  . Bruxism (teeth grinding)   . Chronic contact dermatitis 08/06/2018   Per allergist Dr Pablo Lawrence.   . Chronic migraine 02/25/2016   Per Dr Jaynee Eagles review in notes from [facility] from September 2015. Showed total headache days last month 18. Severe headache days 7 days. Moderate headache days 5 days. Mild headache days last month sick days. Days without headache last month 10 days. Symptoms associated with photophobia, phonophobia, osmophobia, neck pain, dizziness, jaw pain, nasal congestion, vision disturbances, tingling and numbness, weakness and worsening with activity. Each headache attack last 3 hours depending on treatment in severity. Left side, the right side, easier side, the frontal area in the back of the head. Characterized as throbbing, pressure, tightness, squeezing, stabbing and burning   . Chronic migraine without aura 05/15/2013    Dr Jaynee Eagles [facility]  . DDD (degenerative disc disease), cervical 11/18/2018  . DDD (degenerative disc disease), lumbar   . Depression   . Disc displacement, lumbar   . Dysrhythmia    seen by dr Wynonia Lawman- not a problem since she has been on Bystolic  . Episodic cluster headache, not intractable 03/06/2017  . Essential hypertension 05/15/2013  . Family history of adverse reaction to anesthesia    Brother- N/V  . Family history of premature CAD 05/15/2013  . Fibromyalgia syndrome 07/01/2015   Management by Dr Chauncey Cruel. Devashwar (Rheum)   . GERD (gastroesophageal reflux disease) 12/16/2014  . H/O seasonal allergies   . Hammer toe    Left great toe  . Hashimoto's thyroiditis    Per patient, diagnosed by Dr. Modena Nunnery  . Hearing loss of both ears 07/27/2015   mild to borderline moderate low frequency hearing loss improving to within normal limits bilaterally on audiology testing at [facility] in November 2016.    Marland Kitchen History of Clostridium difficile colitis 07/01/2015   Required  Fecal Transplantation tocure  . History of colonic polyps   . Hx of bad fall 02/2015   Severe Facial/head trauma without fracture  . 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 Hyperlipidemia 1998  . Hypothyroidism   . Impairment of balance 02/2015   Consequent of postconcussive syndrome  . Injury of triangular fibrocartilage complex of left wrist 02/25/2019   Dx 02/25/19 Iran Planas IV [facility])  . Insulin resistance 07/04/2017  . Interstitial cystitis   . Irritable bowel syndrome with diarrhea 04/03/2016  . Left ventricular hypertrophy, mild 02/25/2016   ECHOcardiogram report 06/17/15 showing EF55-60%, mild LVH and G1DD   . 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[place]) 03/10/2018   [facility]  . Lumbar facet joint pain   . 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 Mood disorder ([place])   . Morbid obesity ([place]) 03/06/2017  . Musculoskeletal neck pain 07/14/2015  . Nocturnal hypoxemia 03/06/2017  . Normal coronary arteries 05/14/2014  . Obesity (BMI 30.0-34.9) 01/29/2017  . Osteoarthritis of left hip 11/01/2015   MRI order by Dr Alvan Dame (ortho) 10/2015 showed significant arthritis of left hip joint with cystic changes in femoral head c/w osteoarthritis  . 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 Other insomnia 11/09/2016  . Overweight   . Pain in joint of left shoulder 11/09/2016  . Pain in joint, multiple sites 11/18/2018  . Palpitations 05/15/2013   [facility] manages  . Periodic limb movement  sleep disorder 03/28/2017  . Perirectal cyst 05/07/2016  . PONV (postoperative nausea and vomiting)   . Positive ANA (antinuclear antibody) 11/18/2018  . Post concussion syndrome 06/06/2015  . Post concussive syndrome 07/14/2015   Ms Lori's post-concussive syndrome manifesting in vertigo and headache, mood changes, poor balance, dizziness, and decreased concentration per Dr Jaynee Eagles at [facility].   Marland Kitchen Posterior vitreous detachment of right eye 2014  .  S/P left THA, AA 07/25/2016  . Shingles   . Shortness of breath dyspnea    with exertion  . Sicca syndrome ([place]) 11/18/2018   (+) ANA  . Sleep walking and eating 03/06/2017  . Snoring 03/06/2017  . Spondylosis of lumbar region without myelopathy or radiculopathy 10/30/2011  . Thyroid nodule 08/11/2009   Findings: The thyroid gland is within normal limits in size.  The gland is diffusely inhomogeneous. A small solid nodule is noted in the lower pole  medially on the right of 7 x 6 x 8 mm. A small solid nodule is noted inferiorly on the left of 3 x 3 x 4 mm.  IMPRESSION:  The thyroid gland is within normal limits in size with only small solid nodules present, the largest of only 8 mm in diameter on the right.    . 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   . Vasomotor symptoms due to menopause 04/19/2017  . Vitamin D deficiency 05/08/2017  . Yeast vaginitis 07/11/2019    Family History  Problem Relation Age of Onset  . Alzheimer's disease Mother   . Hyperlipidemia Mother   . Hypertension Mother   . Osteoporosis Mother   . Parkinson's disease Mother   . Migraines Sister   . Allergies Sister   . Hypertension Sister   . Hyperlipidemia Brother   . Cardiomyopathy Brother   . Diabetes type II Brother   . Kidney disease Brother   . Asthma Brother   . Heart disease Brother   . Hypertension Brother   . Hyperlipidemia Brother   . Kidney disease Brother   . Heart disease Brother   . Heart disease Father   . Hyperlipidemia Father   . Hypertension Father   . Aortic aneurysm Father   . Early death Father 59  . Diabetes type II Other   . Breast cancer Maternal Aunt    Past Surgical History:  Procedure Laterality Date  . Bladder dilitation     x 3  . BREAST BIOPSY Right 2011   Benign histology  . CARDIOVASCULAR STRESS TEST  2000   Unremarkable per pt report  . CARPOMETACARPAL JOINT ARTHROTOMY Right 2011  . COLONOSCOPY    .  COLONOSCOPY WITH PROPOFOL N/A 04/21/2015   Procedure: COLONOSCOPY WITH PROPOFOL;  Surgeon: Carol Ada, MD;  Location: [facility];  Service: Endoscopy;  Laterality: N/A;  . EPIDURAL BLOCK INJECTION Left 04/12/2016   Left Medial Nerve Block and Left L5 ramus block, Dr Suella Broad   . EPIDURAL BLOCK INJECTION  03/21/2016   Left L3-4 medial branch block and Left L5 & dorsal ramus block   . EPIDURAL BLOCK INJECTION N/A 10/25/2016   Suella Broad, MD. Lumbar medial branch block  . EPIDURAL BLOCK INJECTION N/A 02/09/2017   Suella Broad, MD.  Bilateral L3/4 medial branch block, bilateral L5 dorsal ramus block  . EPIDURAL BLOCK INJECTION N/A 07/04/2017   Suella Broad, MD  . FECAL TRANSPLANT  04/21/2015   Procedure: FECAL TRANSPLANT;  Surgeon: Saralyn Pilar  Benson Norway, MD;  Location: [facility];  Service: Endoscopy;;  . HIP ARTHROPLASTY Left   . HIP ARTHROSCOPY Left 03/06/2018   Left hip arthroplasty, redo for loose hip arthroplasty. Procedure at [facility] hospital  . INJECTION HIP INTRA ARTICULAR Left 11/2015   for OA by Dr Suella Broad  . OTHER SURGICAL HISTORY Left 2016   Left L3/L4 medial nerve block and Left L5 Dorsal Ramus block Dr Mickel Duhamel  . TOTAL HIP ARTHROPLASTY Left 07/25/2016   Procedure: LEFT TOTAL HIP ARTHROPLASTY ANTERIOR APPROACH;  Surgeon: Paralee Cancel, MD;  Location: [facility];  Service: Orthopedics;  Laterality: Left;   Social History   Social History Narrative   Prior PCP With [facility] at [place], [place].   Married, lives with [place], (b. 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    Wears seatbelt usually   No religious beliefs affecting healthcare   No difficulty taking medications as directed.       Home has working smoke alarm   No home throw rugs   Does not have nonslip bathtub / shower    Has railings on all stairs   Home is free from [place].    Right-handed.      No history of  Hospitalization as of 07/2016.       Best number to reach patient [phone] (M) as of 07/01/15   It is permissible to leave messages as of 07/01/15 .    No regular exercise of 3 times a week for 30 minutes at a time      Nychelle Cassata has Advanced Directive and a Doctor, general practice is husband, Makelle Marrone [phone]      Caffeine: 2-3 cups coffee per day   Immunization History  Administered Date(s) Administered  . Influenza Inj Mdck Quad Pf 08/12/2018  . Influenza,inj,Quad PF,6+ Mos 07/01/2015, 05/10/2017, 06/26/2019  . Influenza-Unspecified 09/13/2016, 08/12/2018  . Moderna SARS-COVID-2 Vaccination 11/19/2019, 12/18/2019  . Tdap 11/09/2011, 02/24/2016, 09/13/2016  . Zoster Recombinat (Shingrix) 08/12/2018     Objective: Vital Signs: BP 106/75 (BP Location: Left Arm, Patient Position: Sitting, Cuff Size: Small)   Pulse 88   Ht 5\' 6"  (1.676 m)   Wt 197 lb (89.4 kg)   BMI 31.80 kg/m    Physical Exam Vitals and nursing note reviewed.  Constitutional:      Appearance: She is well-developed.  HENT:     Head: Normocephalic and atraumatic.  Eyes:     Conjunctiva/sclera: Conjunctivae normal.  Pulmonary:     Effort: Pulmonary effort is normal.  Abdominal:     Palpations: Abdomen is soft.  Musculoskeletal:     Cervical back: Normal range of motion.  Skin:    General: Skin is warm and dry.     Capillary Refill: Capillary refill takes less than 2 seconds.  Neurological:     Mental Status: She is alert and oriented to person, place, and time.  Psychiatric:        Behavior: Behavior normal.      Musculoskeletal Exam: C-spine, thoracic spine, and lumbar spine good ROM.  Shoulder joints, elbow joints, wrist joints, MCPs, PIPs, and DIPs good ROM with no synovitis.  Complete fist formation bilaterally.  Bilateral hip replacements have good ROM with no discomfort.  Mild tenderness over both trochanteric bursa.  Knee joints and ankle joints good ROM with no discomfort.   No warmth or effusion of knee  joints noted.  No tenderness or swelling of ankle joints.   CDAI Exam: CDAI Score: -- Patient Global: --; Provider Global: -- Swollen: --; Tender: -- Joint Exam 06/22/2020   No joint exam has been documented for this visit   There is currently no information documented on the homunculus. Go to the Rheumatology activity and complete the homunculus joint exam.  Investigation: No additional findings.  Imaging: No results found.  Recent Labs: Lab Results  Component Value Date   WBC 6.6 07/05/2019   HGB 13.3 07/05/2019   PLT 271 07/05/2019   NA 140 07/24/2019   K 4.6 07/24/2019   CL 103 07/24/2019   CO2 21 07/24/2019   GLUCOSE 78 07/24/2019   BUN 17 07/24/2019   CREATININE 0.93 07/24/2019   BILITOT 0.5 02/12/2020   ALKPHOS 79 02/12/2020   AST 22 02/12/2020   ALT 18 02/12/2020   PROT 7.1 02/12/2020   ALBUMIN 4.7 02/12/2020   CALCIUM 9.8 07/24/2019   GFRAA 75 07/24/2019    Speciality Comments: No specialty comments available.  Procedures:  No procedures performed Allergies: Sulfa antibiotics, Chlorhexidine gluconate, Codeine, Lyrica [pregabalin], Methylprednisolone, Prednisone, Betadine [povidone iodine], Latex, and Wellbutrin [bupropion]   Assessment / Plan:     Visit Diagnoses: Fibromyalgia: She experiences intermittent myalgias and muscle tenderness due to underlying fibromyalgia.  Overall her discomfort has been tolerable.  She has ongoing trapezius muscle tension and muscle tenderness bilaterally.  She continues to experience intermittent trochanteric bursitis bilaterally.  She has mild tenderness palpation on examination today.  She has been going to physical therapy twice a week working on hip strengthening and range of motion exercises since both hips have been replaced.  We discussed the importance of continuing home exercises.  She was given a handout of exercises to perform.  She continues to have chronic fatigue secondary to insomnia.   She takes Ambien 5 mg 1 tablet by mouth at bedtime to help her sleep at night.  We discussed the importance of regular exercise and good sleep hygiene.  She would like to follow-up in 1 year.  Other insomnia -She takes Ambien 5 mg 1 tablet by mouth at bedtime for insomnia. Refill was sent to the pharmacy on 06/14/20 by Dr. Estanislado Pandy.    Other fatigue: Chronic and secondary to insomnia.  We discussed the importance of regular exercise.   Primary osteoarthritis of both hands: She has mild PIP and DIP thickening consistent with osteoarthritis of both hands.  No joint tenderness or synovitis was noted on exam.  She experiences intermittent discomfort and stiffness in both hands.  Left CMC joint prominence noted.  Joint protection and muscle strengthening discussed.  We also discussed the use of arthritis compression gloves.  Trochanteric bursitis of both hips: She has mild tenderness to palpation over bilateral trochanteric bursa.  She was advised to perform stretching exercises on a daily basis.  She was a provided a handout of these exercises to perform.   Status post right hip replacement: Doing well.  She has good ROM with no discomfort.  She has been going to physical therapy twice a week. She plans on continuing home exercises.     Status post left hip replacement: Doing well.  She has good ROM with no discomfort at this time.   DDD (degenerative disc disease), cervical: She has good ROM of the C-spine with no discomfort.  No symptoms of radiculopathy.  She has trapezius muscle tension and tenderness bilaterally.   DDD (degenerative disc disease), lumbar:  She has intermittent lower back pain and stiffness.  She has no symptoms of radiculopathy.   Chronic pain syndrome -She takes Mobic 15 mg po daily and tramadol as needed for pain relief.  History of depression - She is taking celexa 20 mg 1 tablet by mouth daily. A refill was sent to the pharmacy today.   Other medical conditions are listed  as follows:   History of hyperlipidemia  History of hypertension  Vitamin D deficiency  IC (interstitial cystitis)  History of migraine  History of hypothyroidism  Orders: No orders of the defined types were placed in this encounter.  Meds ordered this encounter  Medications  . citalopram (CELEXA) 20 MG tablet    Sig: Take 1 tablet (20 mg total) by mouth daily.    Dispense:  90 tablet    Refill:  1     Follow-Up Instructions: Return in about 1 year (around 06/22/2021) for Fibromyalgia, Osteoarthritis.   Ofilia Neas, PA-C  Note - This record has been created using Dragon software.  Chart creation errors have been sought, but may not always  have been located. Such creation errors do not reflect on  the standard of medical care.

## 2020-06-13 ENCOUNTER — Other Ambulatory Visit: Payer: Self-pay | Admitting: Physician Assistant

## 2020-06-14 ENCOUNTER — Telehealth: Payer: Self-pay

## 2020-06-14 MED ORDER — ZOLPIDEM TARTRATE 5 MG PO TABS: ORAL_TABLET | ORAL | 0 refills | Status: DC

## 2020-06-14 NOTE — Telephone Encounter (Signed)
Patient advised prescription was received today. Patient advised the prescription has been approved by Dr. Estanislado Pandy and has to be faxed instead of e-scribed due to a computer issue. Faxed prescription.

## 2020-06-14 NOTE — Telephone Encounter (Addendum)
Patient's labs were received and scanned into the chart.

## 2020-06-14 NOTE — Telephone Encounter (Signed)
Patient left a voicemail stating the pharmacy faxed the request to refill her Zolpidem, but haven't received a response.  Patient states she is out of medication and is due to it tonight.  Patient is requesting the prescription be sent ASAP.

## 2020-06-14 NOTE — Telephone Encounter (Signed)
Last Visit: 11/12/2019 Next Visit: 06/22/2020  Last Fill: 05/14/2020  Okay to refill Ambien?

## 2020-06-15 ENCOUNTER — Encounter: Payer: Self-pay | Admitting: Family Medicine

## 2020-06-15 ENCOUNTER — Other Ambulatory Visit: Payer: Self-pay

## 2020-06-15 ENCOUNTER — Ambulatory Visit (INDEPENDENT_AMBULATORY_CARE_PROVIDER_SITE_OTHER): Payer: Medicare Other | Admitting: Family Medicine

## 2020-06-15 VITALS — BP 102/62 | HR 92 | Ht 66.0 in | Wt 194.0 lb

## 2020-06-15 DIAGNOSIS — L74519 Primary focal hyperhidrosis, unspecified: Secondary | ICD-10-CM

## 2020-06-15 DIAGNOSIS — G43711 Chronic migraine without aura, intractable, with status migrainosus: Secondary | ICD-10-CM

## 2020-06-15 MED ORDER — RIZATRIPTAN BENZOATE 10 MG PO TABS: 10.0000 mg | ORAL_TABLET | ORAL | 11 refills | Status: DC | PRN

## 2020-06-15 NOTE — Patient Instructions (Addendum)
We will continue Emgality monthly. I will switch eletriptan to rizatriptan to see if this helps with sweating.   Stay well hydrated. Well balanced diet and regular exercise encouraged.   Follow up in 1 year, sooner if needed   Rizatriptan disintegrating tablets What is this medicine? RIZATRIPTAN (rye za TRIP tan) is used to treat migraines with or without aura. An aura is a strange feeling or visual disturbance that warns you of an attack. It is not used to prevent migraines. This medicine may be used for other purposes; ask your health care provider or pharmacist if you have questions. COMMON BRAND NAME(S): Maxalt-MLT What should I tell my health care provider before I take this medicine? They need to know if you have any of these conditions:  cigarette smoker  circulation problems in fingers and toes  diabetes  heart disease  high blood pressure  high cholesterol  history of irregular heartbeat  history of stroke  kidney disease  liver disease  stomach or intestine problems  an unusual or allergic reaction to rizatriptan, other medicines, foods, dyes, or preservatives  pregnant or trying to get pregnant  breast-feeding How should I use this medicine? Take this medicine by mouth. Follow the directions on the prescription label. Leave the tablet in the sealed blister pack until you are ready to take it. With dry hands, open the blister and gently remove the tablet. If the tablet breaks or crumbles, throw it away and take a new tablet out of the blister pack. Place the tablet in the mouth and allow it to dissolve, and then swallow. Do not cut, crush, or chew this medicine. You do not need water to take this medicine. Do not take it more often than directed. Talk to your pediatrician regarding the use of this medicine in children. While this drug may be prescribed for children as young as 6 years for selected conditions, precautions do apply. Overdosage: If you think you  have taken too much of this medicine contact a poison control center or emergency room at once. NOTE: This medicine is only for you. Do not share this medicine with others. What if I miss a dose? This does not apply. This medicine is not for regular use. What may interact with this medicine? Do not take this medicine with any of the following medicines:  certain medicines for migraine headache like almotriptan, eletriptan, frovatriptan, naratriptan, rizatriptan, sumatriptan, zolmitriptan  ergot alkaloids like dihydroergotamine, ergonovine, ergotamine, methylergonovine  MAOIs like Carbex, Eldepryl, Marplan, Nardil, and Parnate This medicine may also interact with the following medications:  certain medicines for depression, anxiety, or psychotic disorders  propranolol This list may not describe all possible interactions. Give your health care provider a list of all the medicines, herbs, non-prescription drugs, or dietary supplements you use. Also tell them if you smoke, drink alcohol, or use illegal drugs. Some items may interact with your medicine. What should I watch for while using this medicine? Visit your healthcare professional for regular checks on your progress. Tell your healthcare professional if your symptoms do not start to get better or if they get worse. You may get drowsy or dizzy. Do not drive, use machinery, or do anything that needs mental alertness until you know how this medicine affects you. Do not stand up or sit up quickly, especially if you are an older patient. This reduces the risk of dizzy or fainting spells. Alcohol may interfere with the effect of this medicine. Your mouth may get dry.  Chewing sugarless gum or sucking hard candy and drinking plenty of water may help. Contact your healthcare professional if the problem does not go away or is severe. If you take migraine medicines for 10 or more days a month, your migraines may get worse. Keep a diary of headache days  and medicine use. Contact your healthcare professional if your migraine attacks occur more frequently. What side effects may I notice from receiving this medicine? Side effects that you should report to your doctor or health care professional as soon as possible:  allergic reactions like skin rash, itching or hives, swelling of the face, lips, or tongue  chest pain or chest tightness  signs and symptoms of a dangerous change in heartbeat or heart rhythm like chest pain; dizziness; fast, irregular heartbeat; palpitations; feeling faint or lightheaded; falls; breathing problems  signs and symptoms of a stroke like changes in vision; confusion; trouble speaking or understanding; severe headaches; sudden numbness or weakness of the face, arm or leg; trouble walking; dizziness; loss of balance or coordination  signs and symptoms of serotonin syndrome like irritable; confusion; diarrhea; fast or irregular heartbeat; muscle twitching; stiff muscles; trouble walking; sweating; high fever; seizures; chills; vomiting Side effects that usually do not require medical attention (report to your doctor or health care professional if they continue or are bothersome):  diarrhea  dizziness  drowsiness  dry mouth  headache  nausea, vomiting  pain, tingling, numbness in the hands or feet  stomach pain This list may not describe all possible side effects. Call your doctor for medical advice about side effects. You may report side effects to FDA at 1-800-FDA-1088. Where should I keep my medicine? Keep out of the reach of children. Store at room temperature between 15 and 30 degrees C (59 and 86 degrees F). Protect from light and moisture. Throw away any unused medicine after the expiration date. NOTE: This sheet is a summary. It may not cover all possible information. If you have questions about this medicine, talk to your doctor, pharmacist, or health care provider.  2020 Elsevier/Gold Standard  (2018-03-12 14:58:08)   Migraine Headache A migraine headache is a very strong throbbing pain on one side or both sides of your head. This type of headache can also cause other symptoms. It can last from 4 hours to 3 days. Talk with your doctor about what things may bring on (trigger) this condition. What are the causes? The exact cause of this condition is not known. This condition may be triggered or caused by:  Drinking alcohol.  Smoking.  Taking medicines, such as: ? Medicine used to treat chest pain (nitroglycerin). ? Birth control pills. ? Estrogen. ? Some blood pressure medicines.  Eating or drinking certain products.  Doing physical activity. Other things that may trigger a migraine headache include:  Having a menstrual period.  Pregnancy.  Hunger.  Stress.  Not getting enough sleep or getting too much sleep.  Weather changes.  Tiredness (fatigue). What increases the risk?  Being 23-42 years old.  Being female.  Having a family history of migraine headaches.  Being Caucasian.  Having depression or anxiety.  Being very overweight. What are the signs or symptoms?  A throbbing pain. This pain may: ? Happen in any area of the head, such as on one side or both sides. ? Make it hard to do daily activities. ? Get worse with physical activity. ? Get worse around bright lights or loud noises.  Other symptoms may include: ? Feeling  sick to your stomach (nauseous). ? Vomiting. ? Dizziness. ? Being sensitive to bright lights, loud noises, or smells.  Before you get a migraine headache, you may get warning signs (an aura). An aura may include: ? Seeing flashing lights or having blind spots. ? Seeing bright spots, halos, or zigzag lines. ? Having tunnel vision or blurred vision. ? Having numbness or a tingling feeling. ? Having trouble talking. ? Having weak muscles.  Some people have symptoms after a migraine headache (postdromal phase), such  as: ? Tiredness. ? Trouble thinking (concentrating). How is this treated?  Taking medicines that: ? Relieve pain. ? Relieve the feeling of being sick to your stomach. ? Prevent migraine headaches.  Treatment may also include: ? Having acupuncture. ? Avoiding foods that bring on migraine headaches. ? Learning ways to control your body functions (biofeedback). ? Therapy to help you know and deal with negative thoughts (cognitive behavioral therapy). Follow these instructions at home: Medicines  Take over-the-counter and prescription medicines only as told by your doctor.  Ask your doctor if the medicine prescribed to you: ? Requires you to avoid driving or using heavy machinery. ? Can cause trouble pooping (constipation). You may need to take these steps to prevent or treat trouble pooping:  Drink enough fluid to keep your pee (urine) pale yellow.  Take over-the-counter or prescription medicines.  Eat foods that are high in fiber. These include beans, whole grains, and fresh fruits and vegetables.  Limit foods that are high in fat and sugar. These include fried or sweet foods. Lifestyle  Do not drink alcohol.  Do not use any products that contain nicotine or tobacco, such as cigarettes, e-cigarettes, and chewing tobacco. If you need help quitting, ask your doctor.  Get at least 8 hours of sleep every night.  Limit and deal with stress. General instructions      Keep a journal to find out what may bring on your migraine headaches. For example, write down: ? What you eat and drink. ? How much sleep you get. ? Any change in what you eat or drink. ? Any change in your medicines.  If you have a migraine headache: ? Avoid things that make your symptoms worse, such as bright lights. ? It may help to lie down in a dark, quiet room. ? Do not drive or use heavy machinery. ? Ask your doctor what activities are safe for you.  Keep all follow-up visits as told by your  doctor. This is important. Contact a doctor if:  You get a migraine headache that is different or worse than others you have had.  You have more than 15 headache days in one month. Get help right away if:  Your migraine headache gets very bad.  Your migraine headache lasts longer than 72 hours.  You have a fever.  You have a stiff neck.  You have trouble seeing.  Your muscles feel weak or like you cannot control them.  You start to lose your balance a lot.  You start to have trouble walking.  You pass out (faint).  You have a seizure. Summary  A migraine headache is a very strong throbbing pain on one side or both sides of your head. These headaches can also cause other symptoms.  This condition may be treated with medicines and changes to your lifestyle.  Keep a journal to find out what may bring on your migraine headaches.  Contact a doctor if you get a migraine headache that is  different or worse than others you have had.  Contact your doctor if you have more than 15 headache days in a month. This information is not intended to replace advice given to you by your health care provider. Make sure you discuss any questions you have with your health care provider. Document Revised: 12/20/2018 Document Reviewed: 10/10/2018 Elsevier Patient Education  [employer].

## 2020-06-15 NOTE — Progress Notes (Addendum)
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 time comments Testing: Ms Pileggi named 18 different animal types in 50 seconds WNL     Lab Results  Component Value Date   WBC 6.6 07/05/2019   HGB 13.3 07/05/2019   HCT 42.1 07/05/2019   MCV 95.0 07/05/2019   PLT 271 07/05/2019      Component Value Date/Time   NA 140 07/24/2019 1116   K 4.6 07/24/2019 1116   CL 103 07/24/2019 1116   CO2 21 07/24/2019 1116   GLUCOSE 78 07/24/2019 1116   GLUCOSE 93 07/05/2019 1629   BUN 17 07/24/2019 1116   CREATININE 0.93 07/24/2019 1116   CREATININE 0.74 02/24/2016 1158   CALCIUM 9.8 07/24/2019 1116   PROT 7.1 02/12/2020 1056   ALBUMIN 4.7 02/12/2020 1056   AST 22 02/12/2020 1056   ALT 18 02/12/2020 1056   ALKPHOS 79 02/12/2020 1056   BILITOT 0.5 02/12/2020 1056   GFRNONAA 65 07/24/2019 1116   GFRNONAA 88 02/24/2016 1158   GFRAA 75 07/24/2019 1116   GFRAA >89 02/24/2016 1158   Lab Results  Component Value Date   CHOL 181 02/12/2020   HDL 59 02/12/2020   LDLCALC 99  02/12/2020   TRIG 129 02/12/2020   CHOLHDL 3.1 02/12/2020   Lab Results  Component Value Date   HGBA1C 5.2 06/26/2019   Lab Results  Component Value Date   VITAMINB12 448 03/22/2017   Lab Results  Component Value Date   TSH 2.790 07/24/2019       ASSESSMENT AND PLAN 64 y.o. year old female  has a past medical history of Allergic rhinoconjunctivitis (07/02/2015), Arthritis, Benign positional vertigo (04/2015), Bruxism (teeth grinding), Chronic contact dermatitis (08/06/2018), Chronic migraine (02/25/2016), Chronic migraine without aura (05/15/2013), DDD (degenerative disc disease), cervical (11/18/2018), DDD (degenerative disc disease), lumbar, Depression, Disc displacement, lumbar, Dysrhythmia, Episodic cluster headache, not intractable (03/06/2017), Essential hypertension (05/15/2013), Family history of adverse reaction to anesthesia, Family history of premature CAD (05/15/2013), Fibromyalgia syndrome (07/01/2015), GERD (gastroesophageal reflux disease) (12/16/2014), H/O seasonal allergies, Hammer toe, Hearing loss of both ears (07/27/2015), History of Clostridium difficile colitis (07/01/2015), History of colonic polyps, bad fall (02/2015), Hyperhidrosis, scalp, 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), Hyperlipidemia (1998), Hypothyroidism, Impairment of balance (02/2015), Injury of triangular fibrocartilage complex of left wrist (02/25/2019), Insulin resistance (07/04/2017), Interstitial cystitis, Irritable bowel syndrome with diarrhea (04/03/2016), Left ventricular hypertrophy, mild (02/25/2016), 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[place]) (03/10/2018), Lumbar facet joint pain, 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, lumbar region  (10/30/2011), Other insomnia (11/09/2016), Overweight, Pain in joint of left shoulder (11/09/2016), Pain in joint, multiple sites (11/18/2018), Palpitations (05/15/2013), 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),  Perirectal cyst (05/07/2016), PONV (postoperative nausea and vomiting), Positive ANA (antinuclear antibody) (11/18/2018), Post concussion syndrome (06/06/2015), Post concussive syndrome (07/14/2015), Posterior vitreous detachment of right eye (2014), S/P left THA, 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), Shingles, Shortness of breath dyspnea, 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 here with     ICD-10-CM   1. Chronic migraine without aura, with intractable migraine, so stated, with status migrainosus  G43.711   2. Excessive sweating, local  L74.519     Madonna continues to experience about 8-9 migrainous headaches every month.  She has been on multiple preventative and abortive medications and feels that current treatment with Emgality has been the best regimen for her.  Eletriptan does work well for abortive therapy, however, she has become concerned regarding excessive sweating of the back of her head over the past 1 to 2 years.  She has been on eletriptan for quite some time.  Could be side effects of triptan therapy.  We have reviewed adverse events with rizatriptan and diaphoresis is reported in less than 1% of the population.  We will try rizatriptan in place of eletriptan.  She was educated on appropriate administration.  We have performed a sleep study in the past that was negative for OSA.  She will continue healthy lifestyle habits.  Avoidance of triggers encouraged.  She will follow-up with me in 1 year, sooner if  needed.  She verbalizes understanding and agreement with this plan.   No orders of the defined types were placed in this encounter.    Meds ordered this encounter  Medications  . rizatriptan (MAXALT) 10 MG tablet    Sig: Take 1 tablet (10 mg total) by mouth as needed for migraine. May repeat in 2 hours if needed    Dispense:  10 tablet    Refill:  11    Order Specific Question:   Supervising Provider    Answer:   Melvenia Beam [id number]      I spent 25 minutes with the patient. 50% of this time was spent counseling and educating patient on plan of care and medications.     Debbora Presto, FNP-C 06/15/2020, 4:08 PM [name] [address], [place],  [address] [phone]   Made any corrections needed, and agree with history, physical, neuro exam,assessment and plan as stated.     Sarina Ill, MD [name]

## 2020-06-22 ENCOUNTER — Other Ambulatory Visit: Payer: Self-pay

## 2020-06-22 ENCOUNTER — Ambulatory Visit (INDEPENDENT_AMBULATORY_CARE_PROVIDER_SITE_OTHER): Payer: Medicare Other | Admitting: Physician Assistant

## 2020-06-22 ENCOUNTER — Encounter: Payer: Self-pay | Admitting: Physician Assistant

## 2020-06-22 VITALS — BP 106/75 | HR 88 | Ht 66.0 in | Wt 197.0 lb

## 2020-06-22 DIAGNOSIS — M19041 Primary osteoarthritis, right hand: Secondary | ICD-10-CM

## 2020-06-22 DIAGNOSIS — M5030 Other cervical disc degeneration, unspecified cervical region: Secondary | ICD-10-CM

## 2020-06-22 DIAGNOSIS — G894 Chronic pain syndrome: Secondary | ICD-10-CM

## 2020-06-22 DIAGNOSIS — M797 Fibromyalgia: Secondary | ICD-10-CM | POA: Diagnosis not present

## 2020-06-22 DIAGNOSIS — Z8679 Personal history of other diseases of the circulatory system: Secondary | ICD-10-CM

## 2020-06-22 DIAGNOSIS — G4709 Other insomnia: Secondary | ICD-10-CM | POA: Diagnosis not present

## 2020-06-22 DIAGNOSIS — R5383 Other fatigue: Secondary | ICD-10-CM

## 2020-06-22 DIAGNOSIS — Z8659 Personal history of other mental and behavioral disorders: Secondary | ICD-10-CM

## 2020-06-22 DIAGNOSIS — M7062 Trochanteric bursitis, left hip: Secondary | ICD-10-CM

## 2020-06-22 DIAGNOSIS — Z8639 Personal history of other endocrine, nutritional and metabolic disease: Secondary | ICD-10-CM

## 2020-06-22 DIAGNOSIS — M7061 Trochanteric bursitis, right hip: Secondary | ICD-10-CM

## 2020-06-22 DIAGNOSIS — E559 Vitamin D deficiency, unspecified: Secondary | ICD-10-CM

## 2020-06-22 DIAGNOSIS — N3010 Interstitial cystitis (chronic) without hematuria: Secondary | ICD-10-CM

## 2020-06-22 DIAGNOSIS — Z8669 Personal history of other diseases of the nervous system and sense organs: Secondary | ICD-10-CM

## 2020-06-22 DIAGNOSIS — M5136 Other intervertebral disc degeneration, lumbar region: Secondary | ICD-10-CM

## 2020-06-22 DIAGNOSIS — M19042 Primary osteoarthritis, left hand: Secondary | ICD-10-CM

## 2020-06-22 DIAGNOSIS — Z96641 Presence of right artificial hip joint: Secondary | ICD-10-CM

## 2020-06-22 DIAGNOSIS — Z96642 Presence of left artificial hip joint: Secondary | ICD-10-CM

## 2020-06-22 MED ORDER — CITALOPRAM HYDROBROMIDE 20 MG PO TABS
20.0000 mg | ORAL_TABLET | Freq: Every day | ORAL | 1 refills | Status: DC
Start: 2020-06-22 — End: 2021-03-01

## 2020-06-22 NOTE — Patient Instructions (Signed)
Hip Bursitis Rehab Ask your health care provider which exercises are safe for you. Do exercises exactly as told by your health care provider and adjust them as directed. It is normal to feel mild stretching, pulling, tightness, or discomfort as you do these exercises. Stop right away if you feel sudden pain or your pain gets worse. Do not begin these exercises until told by your health care provider. Stretching exercise This exercise warms up your muscles and joints and improves the movement and flexibility of your hip. This exercise also helps to relieve pain and stiffness. Iliotibial band stretch An iliotibial band is a strong band of muscle tissue that runs from the outer side of your hip to the outer side of your thigh and knee. 1. Lie on your side with your left / right leg in the top position. 2. Bend your left / right knee and grab your ankle. Stretch out your bottom arm to help you balance. 3. Slowly bring your knee back so your thigh is behind your body. 4. Slowly lower your knee toward the floor until you feel a gentle stretch on the outside of your left / right thigh. If you do not feel a stretch and your knee will not fall farther, place the heel of your other foot on top of your knee and pull your knee down toward the floor with your foot. 5. Hold this position for __________ seconds. 6. Slowly return to the starting position. Repeat __________ times. Complete this exercise __________ times a day. Strengthening exercises These exercises build strength and endurance in your hip and pelvis. Endurance is the ability to use your muscles for a long time, even after they get tired. Bridge This exercise strengthens the muscles that move your thigh backward (hip extensors). 1. Lie on your back on a firm surface with your knees bent and your feet flat on the floor. 2. Tighten your buttocks muscles and lift your buttocks off the floor until your trunk is level with your thighs. ? Do not arch  your back. ? You should feel the muscles working in your buttocks and the back of your thighs. If you do not feel these muscles, slide your feet 1-2 inches (2.5-5 cm) farther away from your buttocks. ? If this exercise is too easy, try doing it with your arms crossed over your chest. 3. Hold this position for __________ seconds. 4. Slowly lower your hips to the starting position. 5. Let your muscles relax completely after each repetition. Repeat __________ times. Complete this exercise __________ times a day. Squats This exercise strengthens the muscles in front of your thigh and knee (quadriceps). 1. Stand in front of a table, with your feet and knees pointing straight ahead. You may rest your hands on the table for balance but not for support. 2. Slowly bend your knees and lower your hips like you are going to sit in a chair. ? Keep your weight over your heels, not over your toes. ? Keep your lower legs upright so they are parallel with the table legs. ? Do not let your hips go lower than your knees. ? Do not bend lower than told by your health care provider. ? If your hip pain increases, do not bend as low. 3. Hold the squat position for __________ seconds. 4. Slowly push with your legs to return to standing. Do not use your hands to pull yourself to standing. Repeat __________ times. Complete this exercise __________ times a day. Hip hike 1. Stand   sideways on a bottom step. Stand on your left / right leg with your other foot unsupported next to the step. You can hold on to the railing or wall for balance if needed. 2. Keep your knees straight and your torso square. Then lift your left / right hip up toward the ceiling. 3. Hold this position for __________ seconds. 4. Slowly let your left / right hip lower toward the floor, past the starting position. Your foot should get closer to the floor. Do not lean or bend your knees. Repeat __________ times. Complete this exercise __________ times a  day. Single leg stand 1. Without shoes, stand near a railing or in a doorway. You may hold on to the railing or door frame as needed for balance. 2. Squeeze your left / right buttock muscles, then lift up your other foot. ? Do not let your left / right hip push out to the side. ? It is helpful to stand in front of a mirror for this exercise so you can watch your hip. 3. Hold this position for __________ seconds. Repeat __________ times. Complete this exercise __________ times a day. This information is not intended to replace advice given to you by your health care provider. Make sure you discuss any questions you have with your health care provider. Document Revised: 12/23/2018 Document Reviewed: 12/23/2018 Elsevier Patient Education  2020 Elsevier Inc.  

## 2020-07-08 ENCOUNTER — Other Ambulatory Visit: Payer: Self-pay | Admitting: Rheumatology

## 2020-07-08 DIAGNOSIS — M797 Fibromyalgia: Secondary | ICD-10-CM

## 2020-07-09 NOTE — Telephone Encounter (Signed)
Patient is in agreement to decrease hr Ambien to 5 mg nightly.

## 2020-07-09 NOTE — Telephone Encounter (Signed)
Please discuss if patient can reduce the Ambien to 5 mg p.o. nightly based on her age.

## 2020-07-09 NOTE — Telephone Encounter (Signed)
Last Visit: 06/22/2020 Next Visit: 06/21/2021  Last Fill: 06/14/2020  Okay to refill Ambien?

## 2020-07-12 ENCOUNTER — Telehealth: Payer: Self-pay | Admitting: Family Medicine

## 2020-07-12 DIAGNOSIS — G43711 Chronic migraine without aura, intractable, with status migrainosus: Secondary | ICD-10-CM

## 2020-07-12 DIAGNOSIS — Z8669 Personal history of other diseases of the nervous system and sense organs: Secondary | ICD-10-CM

## 2020-07-12 MED ORDER — EMGALITY 120 MG/ML ~~LOC~~ SOAJ: 120.0000 mg | SUBCUTANEOUS | 3 refills | Status: DC

## 2020-07-12 NOTE — Telephone Encounter (Signed)
Pt request refill Galcanezumab-gnlm (EMGALITY) 120 MG/ML SOAJ at [id number]

## 2020-07-22 ENCOUNTER — Ambulatory Visit (INDEPENDENT_AMBULATORY_CARE_PROVIDER_SITE_OTHER): Payer: Medicare Other | Admitting: Family Medicine

## 2020-07-22 ENCOUNTER — Other Ambulatory Visit: Payer: Self-pay

## 2020-07-22 ENCOUNTER — Encounter: Payer: Self-pay | Admitting: Family Medicine

## 2020-07-22 VITALS — BP 118/70 | HR 84 | Ht 66.0 in | Wt 192.0 lb

## 2020-07-22 DIAGNOSIS — Z23 Encounter for immunization: Secondary | ICD-10-CM

## 2020-07-22 DIAGNOSIS — Z79899 Other long term (current) drug therapy: Secondary | ICD-10-CM | POA: Diagnosis not present

## 2020-07-22 DIAGNOSIS — E038 Other specified hypothyroidism: Secondary | ICD-10-CM | POA: Diagnosis present

## 2020-07-22 DIAGNOSIS — N762 Acute vulvitis: Secondary | ICD-10-CM | POA: Insufficient documentation

## 2020-07-22 DIAGNOSIS — E7800 Pure hypercholesterolemia, unspecified: Secondary | ICD-10-CM | POA: Diagnosis not present

## 2020-07-22 DIAGNOSIS — L74519 Primary focal hyperhidrosis, unspecified: Secondary | ICD-10-CM

## 2020-07-22 DIAGNOSIS — N763 Subacute and chronic vulvitis: Secondary | ICD-10-CM | POA: Insufficient documentation

## 2020-07-22 DIAGNOSIS — I10 Essential (primary) hypertension: Secondary | ICD-10-CM | POA: Diagnosis not present

## 2020-07-22 HISTORY — DX: Acute vulvitis: N76.2

## 2020-07-22 NOTE — Progress Notes (Signed)
Molly Giles is alone Sources of clinical information for visit is/are patient and past medical records. Nursing assessment for this office visit was reviewed with the patient for accuracy and revision.   Previous Report(s) Reviewed: historical medical records, outside lab reports, office notes and Cardiology, Neurology consultation notes  Depression screen [facility] 2/9 07/22/2020  Decreased Interest 0  Down, Depressed, Hopeless 0  PHQ - 2 Score 0  Altered sleeping 3  Tired, decreased energy 1  Change in appetite 0  Feeling bad or failure about yourself  0  Trouble concentrating 1  Moving slowly or fidgety/restless 0  Suicidal thoughts 0  PHQ-9 Score 5  Difficult doing work/chores Somewhat difficult  Some recent data might be hidden    Fall Risk  11/26/2019 08/14/2017 07/02/2015 07/02/2015 04/08/2015  Falls in the past year? 0 No - No No  Risk for fall due to : - - Impaired balance/gait History of fall(s) -  Follow up Falls evaluation completed - - - -    PHQ9 SCORE ONLY 07/22/2020 11/26/2019 07/24/2019  PHQ-9 Total Score 5 1 2     Adult vaccines due  Topic Date Due  . TETANUS/TDAP  09/13/2026    Health Maintenance Due  Topic Date Due  . PAP SMEAR-Modifier  09/11/2017  . COLONOSCOPY  04/20/2020  . PNA vac Low Risk Adult (1 of 2 - PCV13) Never done      History/P.E. limitations: none  Adult vaccines due  Topic Date Due  . TETANUS/TDAP  09/13/2026   There are no preventive care reminders to display for this patient.  Health Maintenance Due  Topic Date Due  . PAP SMEAR-Modifier  09/11/2017  . COLONOSCOPY  04/20/2020  . PNA vac Low Risk Adult (1 of 2 - PCV13) Never done     Chief Complaint  Patient presents with  . Annual Exam

## 2020-07-22 NOTE — Assessment & Plan Note (Signed)
Managed by function medicine physician Currently on rosuvasatin 10 mg daily last several months Their plan is to repeat Lipid panel in about a month

## 2020-07-22 NOTE — Assessment & Plan Note (Signed)
Established problem Molly Giles reports that recent lab work by her functional medicine physician found the TSH was at the right level.   Well Controlled by report. No signs of complications, medication side effects, or red flags. Continue LT4 125 mcg daily.  Molly Giles plans to bring in a copy of recent labs with TSH results

## 2020-07-22 NOTE — Assessment & Plan Note (Addendum)
Established problem Uncontrolled Unable tolerate topical Al hydroxide bc of fragrance.  Topical glycopyrrolate could be an option. May need to have compounded to ensure no fragrance in preparation.

## 2020-07-22 NOTE — Assessment & Plan Note (Signed)
New problem Rx Nystatin-Triamcinolone prn use

## 2020-07-26 ENCOUNTER — Telehealth: Payer: Self-pay

## 2020-07-26 ENCOUNTER — Encounter: Payer: Self-pay | Admitting: Family Medicine

## 2020-07-26 DIAGNOSIS — N763 Subacute and chronic vulvitis: Secondary | ICD-10-CM

## 2020-07-26 MED ORDER — NYSTATIN-TRIAMCINOLONE 100000-0.1 UNIT/GM-% EX CREA: 1.0000 "application " | TOPICAL_CREAM | Freq: Two times a day (BID) | CUTANEOUS | 3 refills | Status: DC

## 2020-07-26 NOTE — Telephone Encounter (Signed)
Patient calls nurse line regarding skin cream that was discussed in [place] on 07/22/20. I am unable to find where this was sent in to. Please advise if this medication can be resent for patient.   Thanks.   Talbot Grumbling, RN

## 2020-07-27 NOTE — Telephone Encounter (Signed)
Pt. is requesting a PA for emgality.   Pharmacy: [facility] [phone]

## 2020-07-28 NOTE — Progress Notes (Signed)
PA submitted for EMgality 120mg  on Cover my Meds. There is a 24/72hr turn around. Key: [id number] Status: PENDING   RESPONSE: Your information has been submitted to [place]. [employer] [place] will review the request and notify you of the determination decision directly, typically within 3 business days of your submission and once all necessary information is received.  You will also receive your request decision electronically. To check for an update later, open the request again from your dashboard.  If [employer] has not responded within the specified timeframe or if you have any questions about your PA submission, contact [place] directly at [facility]) [phone] or ([place]) [phone].

## 2020-07-29 ENCOUNTER — Telehealth: Payer: Self-pay | Admitting: Family Medicine

## 2020-07-29 NOTE — Telephone Encounter (Signed)
Received this 4:29 pm today via fax.  Will be addressed as form needs AL/NP signature.

## 2020-07-29 NOTE — Telephone Encounter (Signed)
[employer] LVM, We need initial clinical on Galcanezumab-gnlm (EMGALITY) 120 MG/ML SOAJl. . Timeline has already started when we received the request yesterday. Can be resent call [phone], option 5. If you want to do clinical on the phone or resubmit Cover My Meds before the timeline is out. Reminder; we need initial questions, not renewal.

## 2020-08-02 NOTE — Telephone Encounter (Signed)
Fax from [place] form and been filled out and faxed back to [facility] at [phone]  Fax confirmation received

## 2020-08-02 NOTE — Telephone Encounter (Signed)
PA Denied. Will submit an appeal

## 2020-08-04 ENCOUNTER — Telehealth: Payer: Self-pay | Admitting: Family Medicine

## 2020-08-04 ENCOUNTER — Other Ambulatory Visit: Payer: Self-pay | Admitting: Family Medicine

## 2020-08-04 LAB — COMPREHENSIVE METABOLIC PANEL
Albumin: 4.4 (ref 3.5–5.0)
Calcium: 9.6 (ref 8.7–10.7)

## 2020-08-04 LAB — LIPID PANEL
Cholesterol: 170 (ref 0–200)
HDL: 53 (ref 35–70)
LDL Cholesterol: 100
Triglycerides: 92 (ref 40–160)

## 2020-08-04 LAB — CBC AND DIFFERENTIAL
HCT: 41 (ref 36–46)
Hemoglobin: 13.5 (ref 12.0–16.0)
Neutrophils Absolute: 3.3
Platelets: 262 (ref 150–399)
WBC: 6.4

## 2020-08-04 LAB — BASIC METABOLIC PANEL
BUN: 14 (ref 4–21)
CO2: 21 (ref 13–22)
Chloride: 106 (ref 99–108)
Creatinine: 0.9 (ref 0.5–1.1)
Glucose: 94
Potassium: 4.3 (ref 3.4–5.3)
Sodium: 142 (ref 137–147)

## 2020-08-04 LAB — CBC: RBC: 4.26 (ref 3.87–5.11)

## 2020-08-04 LAB — TSH: TSH: 1.73 (ref 0.41–5.90)

## 2020-08-04 NOTE — Telephone Encounter (Signed)
Received a determination for Emgality. PA was denied to not receiving any information of the patient had tried or failed propranolol or topiramate. I checked the PA on [employer] and these questions were never asked. Patient has a documented history of trail and failure of propranolol and topiramate.   Will type an appeal letter to send to [facility]. Will await response.

## 2020-08-13 ENCOUNTER — Other Ambulatory Visit: Payer: Self-pay | Admitting: Rheumatology

## 2020-08-13 DIAGNOSIS — M797 Fibromyalgia: Secondary | ICD-10-CM

## 2020-08-13 NOTE — Telephone Encounter (Signed)
Last Visit: 06/22/2020 Next Visit: 06/21/2021  Last Fill: 07/09/2020  Okay to refill Ambien?

## 2020-08-17 NOTE — Telephone Encounter (Signed)
Appeal letter was signed and faxed to [facility] [phone] for continued Emgality injections.  08-16-20 fax confirmation received.

## 2020-08-26 ENCOUNTER — Other Ambulatory Visit: Payer: Self-pay | Admitting: Family Medicine

## 2020-08-26 ENCOUNTER — Encounter: Payer: Self-pay | Admitting: Family Medicine

## 2020-08-26 LAB — LDL-P
Free T4: 1.18
HDL Particle Number: 35.2
LDL Particle Number: 1339
LDL Size: 20.9
LP-IR Score: 54
Small LDL Particle Number: 481

## 2020-08-31 NOTE — Telephone Encounter (Signed)
Called and appeal was overturned approved 08-07-20 thru 08-09-2021 for emgality.

## 2020-09-12 ENCOUNTER — Other Ambulatory Visit: Payer: Self-pay | Admitting: Physician Assistant

## 2020-09-12 DIAGNOSIS — M797 Fibromyalgia: Secondary | ICD-10-CM

## 2020-09-13 NOTE — Telephone Encounter (Signed)
Last Visit: 06/22/2020 Next Visit: 06/21/2021  Last Fill: 08/13/2020  Current Dose per office note on 06/22/2020:  Ambien 5 mg 1 tablet by mouth at bedtime   Okay to refill Ambien?

## 2020-10-03 ENCOUNTER — Other Ambulatory Visit: Payer: Self-pay | Admitting: Family Medicine

## 2020-10-03 DIAGNOSIS — E038 Other specified hypothyroidism: Secondary | ICD-10-CM

## 2020-10-04 ENCOUNTER — Other Ambulatory Visit: Payer: Self-pay | Admitting: Family Medicine

## 2020-10-04 ENCOUNTER — Telehealth: Payer: Self-pay | Admitting: Family Medicine

## 2020-10-04 DIAGNOSIS — E038 Other specified hypothyroidism: Secondary | ICD-10-CM

## 2020-10-04 MED ORDER — LEVOTHYROXINE SODIUM 125 MCG PO TABS: 62.5000 ug | ORAL_TABLET | Freq: Every day | ORAL | 3 refills | Status: DC

## 2020-10-04 NOTE — Progress Notes (Signed)
Confirmed pt's LT4 dose 125 mcg tablets, Half tablet (62.5 mg) daily.

## 2020-10-07 ENCOUNTER — Telehealth: Payer: Self-pay

## 2020-10-07 NOTE — Telephone Encounter (Signed)
Patient states she is have dental procedure which she will need antibiotics for. 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  Patient is planning to schedule procedure prior to next Friday. Routing to provider. Eugenia Mcalpine

## 2020-10-12 ENCOUNTER — Other Ambulatory Visit: Payer: Self-pay | Admitting: Rheumatology

## 2020-10-12 DIAGNOSIS — M797 Fibromyalgia: Secondary | ICD-10-CM

## 2020-10-12 NOTE — Telephone Encounter (Signed)
Last Visit: 06/22/2020 Next Visit: 06/21/2021  Last Fill:09/13/2020  Current Dose per office note on 06/22/2020: Ambien 5 mg 1 tablet by mouth at bedtime   Okay to refill Ambien?

## 2020-10-22 NOTE — Telephone Encounter (Signed)
Pt's endodontist prescribed doxycyline prior to your response. Is that ok? She's having an infection at the root removed.   Thanks!

## 2020-10-22 NOTE — Telephone Encounter (Signed)
She doesn't necessarily have to take antibiotics for dental cleaning. If she were to need abtx for dental abscess, I would do penicillin 500mg  VK 2 tabs.x1

## 2020-11-11 ENCOUNTER — Other Ambulatory Visit: Payer: Self-pay | Admitting: Rheumatology

## 2020-11-11 DIAGNOSIS — M797 Fibromyalgia: Secondary | ICD-10-CM

## 2020-11-12 NOTE — Telephone Encounter (Signed)
I noticed that patient received 10 tablets of alprazolam recently.  Please advise her not to take any Ambien with alprazolam.  If she will continue to receive benzodiazepine then she will have to discontinue Ambien.

## 2020-11-12 NOTE — Telephone Encounter (Signed)
Last Visit: 06/22/2020 Next Visit: 06/21/2021  Current Dose per office note on 06/22/2020,  Ambien 5 mg 1 tablet by mouth at bedtime  Dx: Other insomnia   Last Fill:10/12/2020  Okay to refill Ambien?

## 2020-11-19 DIAGNOSIS — K2900 Acute gastritis without bleeding: Secondary | ICD-10-CM

## 2020-11-19 DIAGNOSIS — K602 Anal fissure, unspecified: Secondary | ICD-10-CM

## 2020-11-19 HISTORY — DX: Acute gastritis without bleeding: K29.00

## 2020-11-19 HISTORY — DX: Anal fissure, unspecified: K60.2

## 2020-11-22 ENCOUNTER — Telehealth: Payer: Self-pay | Admitting: Family Medicine

## 2020-11-22 DIAGNOSIS — Z8669 Personal history of other diseases of the nervous system and sense organs: Secondary | ICD-10-CM

## 2020-11-22 MED ORDER — EMGALITY 120 MG/ML ~~LOC~~ SOAJ: 120.0000 mg | SUBCUTANEOUS | 8 refills | Status: DC

## 2020-11-22 NOTE — Telephone Encounter (Signed)
I called and LMVM for pt to return call. Last appeal thru [employer] 07-2021.

## 2020-11-22 NOTE — Addendum Note (Signed)
Addended by: Brandon Melnick on: 11/22/2020 04:29 PM   Modules accepted: Orders

## 2020-11-22 NOTE — Telephone Encounter (Signed)
Sent in prescription until 07-2021 for emaglity. Pt aware.

## 2020-11-22 NOTE — Telephone Encounter (Signed)
Pt. states that Galcanezumab-gnlm (EMGALITY) 120 MG/ML SOAJ has been denied & she wants to discuss this with RN.

## 2020-12-11 ENCOUNTER — Other Ambulatory Visit: Payer: Self-pay | Admitting: Rheumatology

## 2020-12-11 DIAGNOSIS — M797 Fibromyalgia: Secondary | ICD-10-CM

## 2020-12-13 NOTE — Telephone Encounter (Signed)
Next Visit: 06/21/2021  Last Visit: 06/22/2020  Last Fill: 11/12/2020  Dx:  Insomnia Current Dose per office note on 06/22/2020, Ambien 5 mg 1 tablet by mouth at bedtime for insomnia  Okay to refill Ambien?

## 2020-12-28 ENCOUNTER — Telehealth: Payer: Self-pay | Admitting: *Deleted

## 2020-12-28 NOTE — Telephone Encounter (Signed)
Drug safety concern discussed with patient concerning use of  Zolpidem, Tramadol and Citalopram, patient is taking Ambien daily and tramadol PRN.

## 2021-01-12 ENCOUNTER — Other Ambulatory Visit: Payer: Self-pay | Admitting: Physician Assistant

## 2021-01-12 DIAGNOSIS — M797 Fibromyalgia: Secondary | ICD-10-CM

## 2021-01-12 NOTE — Telephone Encounter (Signed)
Next Visit: 06/21/2021  Last Visit: 06/22/2020  Last Fill: 12/13/2020  Dx: Other insomnia  Current Dose per office note on 06/22/2020, Ambien 5 mg 1 tablet by mouth at bedtime for insomnia  Okay to refill Ambien?

## 2021-01-17 ENCOUNTER — Other Ambulatory Visit: Payer: Self-pay | Admitting: Family Medicine

## 2021-01-17 DIAGNOSIS — Z1231 Encounter for screening mammogram for malignant neoplasm of breast: Secondary | ICD-10-CM

## 2021-01-28 ENCOUNTER — Encounter: Payer: Self-pay | Admitting: Family Medicine

## 2021-01-28 DIAGNOSIS — D126 Benign neoplasm of colon, unspecified: Secondary | ICD-10-CM

## 2021-01-28 DIAGNOSIS — K648 Other hemorrhoids: Secondary | ICD-10-CM | POA: Insufficient documentation

## 2021-01-28 HISTORY — DX: Benign neoplasm of colon, unspecified: D12.6

## 2021-01-28 HISTORY — DX: Other hemorrhoids: K64.8

## 2021-01-28 LAB — HM COLONOSCOPY

## 2021-02-10 ENCOUNTER — Other Ambulatory Visit: Payer: Self-pay | Admitting: Physician Assistant

## 2021-02-10 DIAGNOSIS — M797 Fibromyalgia: Secondary | ICD-10-CM

## 2021-02-11 NOTE — Telephone Encounter (Signed)
Next Visit: 06/21/2021  Last Visit: 06/22/2020  Last Fill:01/12/2021  Dx:   Other insomnia  Current Dose per office note on 06/22/2020,  Ambien 5 mg 1 tablet by mouth at bedtime  Okay to refill Ambien?

## 2021-02-22 DIAGNOSIS — M19019 Primary osteoarthritis, unspecified shoulder: Secondary | ICD-10-CM

## 2021-02-22 DIAGNOSIS — S43432A Superior glenoid labrum lesion of left shoulder, initial encounter: Secondary | ICD-10-CM | POA: Insufficient documentation

## 2021-02-22 HISTORY — DX: Superior glenoid labrum lesion of left shoulder, initial encounter: S43.432A

## 2021-02-22 HISTORY — DX: Primary osteoarthritis, unspecified shoulder: M19.019

## 2021-03-01 ENCOUNTER — Other Ambulatory Visit: Payer: Self-pay | Admitting: *Deleted

## 2021-03-01 MED ORDER — CITALOPRAM HYDROBROMIDE 20 MG PO TABS: 20.0000 mg | ORAL_TABLET | Freq: Every day | ORAL | 0 refills | Status: DC

## 2021-03-01 NOTE — Telephone Encounter (Signed)
Next Visit: 06/21/2021  Last Visit: 06/22/2020  Last Fill: 06/22/2020  Dx: History of depression  Current Dose per office note on 06/22/2020: celexa 20 mg 1 tablet by mouth daily.  Okay to refill Celexa?

## 2021-03-02 DIAGNOSIS — N3281 Overactive bladder: Secondary | ICD-10-CM

## 2021-03-02 HISTORY — DX: Overactive bladder: N32.81

## 2021-03-11 ENCOUNTER — Ambulatory Visit
Admission: RE | Admit: 2021-03-11 | Discharge: 2021-03-11 | Disposition: A | Discharge status: Home / Self Care | Payer: Medicare Other | Source: Ambulatory Visit | Attending: Family Medicine | Admitting: Family Medicine

## 2021-03-11 ENCOUNTER — Other Ambulatory Visit: Payer: Self-pay

## 2021-03-11 DIAGNOSIS — Z1231 Encounter for screening mammogram for malignant neoplasm of breast: Secondary | ICD-10-CM

## 2021-03-14 ENCOUNTER — Other Ambulatory Visit: Payer: Self-pay | Admitting: Physician Assistant

## 2021-03-14 DIAGNOSIS — M797 Fibromyalgia: Secondary | ICD-10-CM

## 2021-03-15 ENCOUNTER — Telehealth: Payer: Self-pay

## 2021-03-15 NOTE — Telephone Encounter (Signed)
Left message to advise patient prescription for Ambien has been sent to the pharmacy.

## 2021-03-15 NOTE — Telephone Encounter (Signed)
Next Visit: 06/21/2021   Last Visit: 06/22/2020   Last Fill:02/11/2021   Dx:   Other insomnia   Current Dose per office note on 06/22/2020,  Ambien 5 mg 1 tablet by mouth at bedtime   Okay to refill Ambien?

## 2021-03-15 NOTE — Telephone Encounter (Signed)
Patient called requesting prescription refill of Zolpidem to be sent to [facility] at [address].  Patient states she is out of medication.

## 2021-03-16 ENCOUNTER — Encounter: Payer: Self-pay | Admitting: Family Medicine

## 2021-03-16 DIAGNOSIS — L237 Allergic contact dermatitis due to plants, except food: Secondary | ICD-10-CM

## 2021-03-18 MED ORDER — PREDNISONE 20 MG PO TABS: 20.0000 mg | ORAL_TABLET | Freq: Every day | ORAL | 0 refills | Status: AC

## 2021-03-18 NOTE — Telephone Encounter (Signed)
Patient calls nurse line checking the stating of medication. Will forward to PCP.

## 2021-03-18 NOTE — Telephone Encounter (Signed)
I sent in the prednisone for your dermatitis.  I noted that you allergry list has you having facial flushing with prednisone in the past. If you think the possibility of facial flushing would be worse than the poison ivy rash, do not take the prednisone.   Molly Giles

## 2021-03-24 ENCOUNTER — Telehealth (INDEPENDENT_AMBULATORY_CARE_PROVIDER_SITE_OTHER): Payer: Medicare Other | Admitting: Family Medicine

## 2021-03-24 DIAGNOSIS — L237 Allergic contact dermatitis due to plants, except food: Secondary | ICD-10-CM

## 2021-03-24 HISTORY — DX: Allergic contact dermatitis due to plants, except food: L23.7

## 2021-03-24 MED ORDER — TRIAMCINOLONE ACETONIDE 0.5 % EX OINT: 1.0000 "application " | TOPICAL_OINTMENT | Freq: Two times a day (BID) | CUTANEOUS | 0 refills | Status: AC

## 2021-03-24 MED ORDER — LORATADINE 10 MG PO TABS
10.0000 mg | ORAL_TABLET | Freq: Every day | ORAL | 0 refills | Status: DC
Start: 2021-03-24 — End: 2021-06-21

## 2021-03-24 NOTE — Assessment & Plan Note (Addendum)
Worsening of poison ivy dermatitis unfortunately not improving with oral prednisone. Pt would like to avoid further prednisone if possible due to side effects. Recommended oatmeal baths, triamcinolone cream to apply to affected areas only, SPF when outside in the sun, loratidine for pruritus. Follow up with dermatologist next week.

## 2021-03-24 NOTE — Progress Notes (Signed)
[place] Telemedicine Visit  Patient consented to have virtual visit and was identified by name and date of birth. Method of visit: Video  Encounter participants: Patient: Molly Giles - located at home Provider: Lattie Haw - located at home  Chief Complaint: Poison ivy rash   HPI:  Poison ivy rash  Pt reports a 2 week history of rash which started on abdomen, neck, face after she was helping husband pulling weeds in the garden. It initially looked like puffy and erythematous and now it looks like a pin prick rash. She sent pics to Dr McDiarmid who diagnosed it as poison ivy and prescribed 20mg  Prednisone for 7 days. Now it is a pin prick rash over chest, back, arms and under breasts. The rash is burning and pruititus.  Has had a similar episode in 2021 and seen by a dermatologist. He put her on heavy doses of prednisone. Has an upcoming with the dermatologist next week. The prednisone has given her flushing and insomnia. She would like to avoid taking further prednisone if possible. She has also tried 2.5% hydrocortisone cream, calamine cream and also zyretc. The zyrtec has been sedating for her. She would like an alternative before she sees the dermatologist.  ROS: per HPI  Pertinent PMHx: Sjogren's syndrome, depression   Exam:  Erythematous, papular rash over breast, chest and back Respiratory: speaking in full sentences   Assessment/Plan:  Poison ivy dermatitis Worsening of poison ivy dermatitis unfortunately not improving with oral prednisone. Pt would like to avoid further prednisone if possible due to side effects. Recommended oatmeal baths, triamcinolone cream to apply to affected areas only, SPF when outside in the sun, loratidine for pruritus. Follow up with dermatologist next week.     Time spent during visit with patient: 20 minutes

## 2021-03-29 ENCOUNTER — Telehealth: Payer: Self-pay | Admitting: Family Medicine

## 2021-03-29 MED ORDER — EMGALITY 120 MG/ML ~~LOC~~ SOAJ: 120.0000 mg | Freq: Once | SUBCUTANEOUS | 0 refills | Status: AC

## 2021-03-29 NOTE — Addendum Note (Signed)
Addended by: Darleen Crocker on: 03/29/2021 05:55 PM   Modules accepted: Orders

## 2021-03-29 NOTE — Telephone Encounter (Signed)
Called patient back.  Patient has reached her maximum amount for medications and is in the "donut hole".  She says that the medication would cost her $177 a month and she cannot afford that.  Patient is agreeable to coming in for a follow-up visit to discuss alternative medications.  In the meantime we will provide a sample of the medication for the patient to hold her over until we can discuss alternative options.  Patient is scheduled for an appointment in August on the 29th at 10 AM.  Patient was appreciative for the call back and for the stable.  She will be by to pick it up tomorrow.

## 2021-03-29 NOTE — Telephone Encounter (Signed)
Pt called stating that due to her getting Medicare she can no longer afford her Galcanezumab-gnlm (EMGALITY) 120 MG/ML SOAJ and is wanting to know if she can be put on another medication. Please advise.

## 2021-04-11 HISTORY — PX: INTERSTIM IMPLANT PLACEMENT: SHX5130

## 2021-04-13 ENCOUNTER — Other Ambulatory Visit: Payer: Self-pay | Admitting: *Deleted

## 2021-04-13 DIAGNOSIS — M797 Fibromyalgia: Secondary | ICD-10-CM

## 2021-04-13 MED ORDER — ZOLPIDEM TARTRATE 5 MG PO TABS: ORAL_TABLET | ORAL | 0 refills | Status: DC

## 2021-04-13 NOTE — Telephone Encounter (Signed)
Next Visit: 06/21/2021   Last Visit: 06/22/2020   Last Fill:03/15/2021   Dx:   Other insomnia   Current Dose per office note on 06/22/2020,  Ambien 5 mg 1 tablet by mouth at bedtime   Okay to refill Ambien?

## 2021-05-04 NOTE — Progress Notes (Signed)
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 and imaging myself where available.  MMSE - Mini Mental State Exam 02/25/2016  Orientation to time (No Data)  Orientation to time comments Testing: Ms Erhart named 18 different animal types in 50 seconds WNL     Lab Results  Component Value Date   WBC 6.4 08/04/2020   HGB 13.5 08/04/2020   HCT 41 08/04/2020   MCV 95.0 07/05/2019   PLT 262 08/04/2020      Component Value Date/Time   NA 142 08/04/2020 0000   K 4.3 08/04/2020 0000   CL 106 08/04/2020 0000   CO2 21 08/04/2020 0000   GLUCOSE 78 07/24/2019 1116   GLUCOSE 93 07/05/2019 1629   BUN 14 08/04/2020 0000   CREATININE 0.9 08/04/2020 0000   CREATININE 0.93 07/24/2019 1116   CREATININE 0.74 02/24/2016 1158   CALCIUM 9.6 08/04/2020 0000   PROT 7.1 02/12/2020 1056   ALBUMIN 4.4 08/04/2020 0000   ALBUMIN 4.7 02/12/2020 1056   AST 22 02/12/2020 1056   ALT 18 02/12/2020 1056   ALKPHOS 79 02/12/2020 1056   BILITOT 0.5 02/12/2020 1056   GFRNONAA 65 07/24/2019 1116    GFRNONAA 88 02/24/2016 1158   GFRAA 75 07/24/2019 1116   GFRAA >89 02/24/2016 1158   Lab Results  Component Value Date   CHOL 170 08/04/2020   HDL 53 08/04/2020   LDLCALC 100 08/04/2020   TRIG 92 08/04/2020   CHOLHDL 3.1 02/12/2020   Lab Results  Component Value Date   HGBA1C 5.2 06/26/2019   Lab Results  Component Value Date   VITAMINB12 448 03/22/2017   Lab Results  Component Value Date   TSH 1.73 08/04/2020       ASSESSMENT AND PLAN 65 y.o. year old female  has a past medical history of Allergic rhinoconjunctivitis (07/02/2015), Arthritis, Benign positional vertigo (04/2015), Bruxism (teeth grinding), Chronic contact dermatitis (08/06/2018), Chronic migraine (02/25/2016), Chronic migraine without aura (05/15/2013), DDD (degenerative disc disease), cervical (11/18/2018), DDD (degenerative disc disease), lumbar, Depression, Disc displacement, lumbar, Dysrhythmia, Episodic cluster headache, not intractable (03/06/2017), Essential hypertension (05/15/2013), Family history of adverse reaction to anesthesia, Family history of premature CAD (05/15/2013), Fibromyalgia syndrome (07/01/2015), GERD (gastroesophageal reflux disease) (12/16/2014), H/O seasonal allergies, Hammer toe, Hashimoto's thyroiditis, Hearing loss of both ears (07/27/2015), History of Clostridium difficile colitis (07/01/2015), History of colonic polyps, bad fall (02/2015), Hyperhidrosis, scalp, 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), Hyperlipidemia (1998), Hypokalemia due to excessive gastrointestinal loss of potassium (  07/28/2019), Hypothyroidism, Impairment of balance (02/2015), Injury of triangular fibrocartilage complex of left wrist (02/25/2019), Insulin resistance (07/04/2017), Internal hemorrhoid (01/28/2021), Interstitial cystitis, Irritable bowel syndrome with diarrhea (04/03/2016), Left ventricular hypertrophy, mild (02/25/2016), 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[place]) (03/10/2018), Lumbar facet joint pain, 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),  Mood disorder ([place]), Morbid obesity ([place]) (03/06/2017), Musculoskeletal neck pain (07/14/2015), Nocturnal hypoxemia (03/06/2017), Normal coronary arteries (05/14/2014), Obesity (BMI 30.0-34.9) (01/29/2017), Osteoarthritis of left hip (11/01/2015), 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, lumbar region (10/30/2011), Other insomnia (11/09/2016), Overweight, Pain in joint of left shoulder (11/09/2016), Pain in joint, multiple sites (11/18/2018), Palpitations (05/15/2013), 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), Perirectal cyst (05/07/2016), PONV (postoperative nausea and vomiting), Positive ANA (antinuclear antibody) (11/18/2018), Post concussion syndrome (06/06/2015), Post concussive syndrome (07/14/2015), Posterior vitreous detachment of right eye (2014), S/P left THA, 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 here with     ICD-10-CM   1. Migraine without aura and without status migrainosus, not intractable  G43.009 Atogepant (QULIPTA) 60 MG TABS       Ricky has felt migraines are best managed on Emgality, however, she can not afford injections. We have reviewed previously tried migraine abortive and prevention medications. We have consider AEDs, however, she is very concerned of side effects. I will try to get Qulipta covered through [facility]. She may be able to qualify for patient assistance. We have reviewed possibility to return to propranolol versus topiramate if we can not get Qulipta covered. She was given Air traffic controller on [place].  Side effects and appropriate dosing discussed.  She will continue healthy lifestyle habits.  Avoidance of triggers encouraged.  She will follow-up with me in 1 year, sooner if needed.  She verbalizes understanding and agreement with this plan.   No orders of the defined types were placed in this encounter.    Meds ordered this encounter  Medications   Atogepant (QULIPTA) 60 MG TABS    Sig: Take 60 mg by mouth daily.    Dispense:  90 tablet    Refill:  3    Order Specific Question:   Supervising Provider    Answer:   Bess Harvest, FNP-C 05/09/2021, 12:34 PM [facility] [address], [place], [place] [address] [phone]

## 2021-05-04 NOTE — Patient Instructions (Signed)
Below is our plan:  We will try to get Molly Giles covered for you. If not we will go back to propranolol.   Please make sure you are staying well hydrated. I recommend 50-60 ounces daily. Well balanced diet and regular exercise encouraged. Consistent sleep schedule with 6-8 hours recommended.   Please continue follow up with care team as directed.   Follow up with me in 3-6 months   You may receive a survey regarding today's visit. I encourage you to leave honest feed back as I do use this information to improve patient care. Thank you for seeing me today!

## 2021-05-09 ENCOUNTER — Encounter: Payer: Self-pay | Admitting: Family Medicine

## 2021-05-09 ENCOUNTER — Ambulatory Visit (INDEPENDENT_AMBULATORY_CARE_PROVIDER_SITE_OTHER): Payer: Medicare Other | Admitting: Family Medicine

## 2021-05-09 VITALS — BP 120/85 | HR 97 | Ht 66.5 in | Wt 179.2 lb

## 2021-05-09 DIAGNOSIS — G43009 Migraine without aura, not intractable, without status migrainosus: Secondary | ICD-10-CM

## 2021-05-09 DIAGNOSIS — G43709 Chronic migraine without aura, not intractable, without status migrainosus: Secondary | ICD-10-CM

## 2021-05-09 MED ORDER — QULIPTA 60 MG PO TABS: 60.0000 mg | ORAL_TABLET | Freq: Every day | ORAL | 3 refills | Status: DC

## 2021-05-10 ENCOUNTER — Encounter: Payer: Self-pay | Admitting: Family Medicine

## 2021-05-10 DIAGNOSIS — K611 Rectal abscess: Secondary | ICD-10-CM

## 2021-05-10 DIAGNOSIS — R140 Abdominal distension (gaseous): Secondary | ICD-10-CM | POA: Insufficient documentation

## 2021-05-10 HISTORY — DX: Abdominal distension (gaseous): R14.0

## 2021-05-10 HISTORY — DX: Rectal abscess: K61.1

## 2021-05-12 ENCOUNTER — Other Ambulatory Visit: Payer: Self-pay | Admitting: Physician Assistant

## 2021-05-12 ENCOUNTER — Telehealth: Payer: Self-pay | Admitting: Family Medicine

## 2021-05-12 DIAGNOSIS — M797 Fibromyalgia: Secondary | ICD-10-CM

## 2021-05-12 NOTE — Telephone Encounter (Signed)
Pt called, update on if Atogepant (QULIPTA) 60 MG TABS has been approved by insurance. Would like a call from the nurse.

## 2021-05-12 NOTE — Telephone Encounter (Signed)
Reviewed pt chart. AL,NP sent in [place] rx to [facility] 05/09/21. I tried calling them at [phone]. Closed for lunch until 2:30pm. Will try again later.

## 2021-05-12 NOTE — Telephone Encounter (Signed)
PA approved effective from 05/12/2021 through 05/12/2022.

## 2021-05-12 NOTE — Telephone Encounter (Signed)
Called pt back. Advised prior auth approved, she should now be able to fill prescription. Provided her phone# to pharmacy. She will f/u w/ pharmacy to get prescription processed.

## 2021-05-12 NOTE — Telephone Encounter (Signed)
Called [facility] pharmacy back. Insurance requesting PA. CMM key: [id number].   [id number], Doreen SalvageKR:[id number], PCN: [place], [id number].   Submitted PA on CMM. Waiting on determination from [place] Medicare Part D.

## 2021-05-13 NOTE — Telephone Encounter (Signed)
Next Visit: 06/21/2021   Last Visit: 06/22/2020   Last Fill:04/13/2021   Dx:   Other insomnia   Current Dose per office note on 06/22/2020,  Ambien 5 mg 1 tablet by mouth at bedtime   Okay to refill Ambien?

## 2021-05-18 NOTE — Telephone Encounter (Signed)
Pt states that she will not be able to afford the medication in a form of 90 or even a 30 day.  Pt states she was informed that Amy, NP would discuss with Dr Jaynee Eagles other options.  Pt would like a call with the status of what other medication she could try.

## 2021-05-19 NOTE — Telephone Encounter (Signed)
Called pt back. She will pick up two samples of Emgality today from office before 5pm. Samples in fridge in sample closet in brown paper bag w/ pt name/dob  Sample: Emagality '120mg'$  Lot: [id number], exp: 01/14/2023.

## 2021-05-19 NOTE — Telephone Encounter (Signed)
Insurance will cover the medication, approval info below. However, it is still too expensive for her even with insurance coverage. Do you still want to offer samples of emgality?

## 2021-05-30 ENCOUNTER — Other Ambulatory Visit: Payer: Self-pay | Admitting: Family Medicine

## 2021-05-30 ENCOUNTER — Other Ambulatory Visit: Payer: Self-pay | Admitting: Physician Assistant

## 2021-05-30 NOTE — Telephone Encounter (Signed)
Next Visit: 06/21/2021   Last Visit: 06/22/2020   Last Fill: 03/01/2021   Dx: History of depression   Current Dose per office note on 06/22/2020: celexa 20 mg 1 tablet by mouth daily.   Okay to refill Celexa?

## 2021-06-07 NOTE — Progress Notes (Signed)
Office Visit Note  Patient: Molly Giles             Date of Birth: 09/25/1955           MRN: [id number]             PCP: McDiarmid, Blane Ohara, MD Referring: McDiarmid, Blane Ohara, MD Visit Date: 06/21/2021 Occupation: @GUAROCC @  Subjective:  Other (Patient reports increased right hip pain and neck pain. )   History of Present Illness: Molly Giles is a 65 y.o. female with a history of fibromyalgia and osteoarthritis.  She states she has been experiencing increased discomfort in her left trapezius region and right hip.  She has been experiencing increased discomfort recently.  She states the Celexa is not controlling her mood anymore.  She ran out of Ambien for couple of nights and could not sleep for both nights.  She states that she is unable to sleep if she does not get Ambien.  She still takes tramadol and muscle relaxers on as needed basis which is prescribed by Dr. Nelva Bush.  Activities of Daily Living:  Patient reports morning stiffness for 30 minutes.   Patient Reports nocturnal pain.  Difficulty dressing/grooming: Denies Difficulty climbing stairs: Reports Difficulty getting out of chair: Denies Difficulty using hands for taps, buttons, cutlery, and/or writing: Reports  Review of Systems  Constitutional:  Positive for fatigue.  HENT:  Positive for mouth sores, mouth dryness and nose dryness.   Eyes:  Positive for dryness. Negative for pain and itching.  Respiratory:  Negative for shortness of breath and difficulty breathing.   Cardiovascular:  Negative for chest pain and palpitations.  Gastrointestinal:  Negative for blood in stool, constipation and diarrhea.  Endocrine: Negative for increased urination.  Genitourinary:  Negative for difficulty urinating.  Musculoskeletal:  Positive for joint pain, joint pain, myalgias, morning stiffness, muscle tenderness and myalgias. Negative for joint swelling.  Skin:  Negative for color change, rash, redness and sensitivity to  sunlight.  Allergic/Immunologic: Negative for susceptible to infections.  Neurological:  Positive for dizziness, headaches and memory loss. Negative for numbness and weakness.  Hematological:  Positive for bruising/bleeding tendency.  Psychiatric/Behavioral:  Positive for depressed mood. Negative for confusion and sleep disturbance. The patient is not nervous/anxious.    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   Chronic low back pain 05/14/2019   Sjogren's syndrome ([place]) 01/27/2019   Cervical spondylosis 11/18/2018   DDD (degenerative disc disease), lumbar 11/18/2018   Sicca syndrome ([place]) 11/18/2018   Chronic contact dermatitis 08/06/2018   Primary osteoarthritis of left knee 06/20/2018   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[place]) 11/29/2017   Vitamin D deficiency 05/08/2017   Periodic limb movement sleep disorder 03/28/2017   Obesity (BMI 30.0-34.9) 01/29/2017   Other insomnia 11/09/2016   Irritable bowel syndrome with diarrhea 04/03/2016   Left ventricular hypertrophy, mild 02/25/2016   Depression (NOS) 07/02/2015   Allergic rhinoconjunctivitis 07/02/2015   Fibromyalgia syndrome 07/01/2015   Personal history of colonic polyps 07/01/2015   Hypothyroidism 05/15/2013   Pure hypercholesterolemia 05/15/2013   Interstitial cystitis 05/15/2013   Chronic migraine without aura 05/15/2013    Past Medical History:  Diagnosis Date   Abdominal bloating 05/10/2021   Acute gastritis 11/19/2020   Allergic rhinoconjunctivitis 07/02/2015  Anal fissure 11/19/2020   Arthritis    Benign positional vertigo 04/2015   Responded well to [facility]   Bruxism (teeth grinding)    Chronic contact dermatitis 08/06/2018   Per allergist Dr Pablo Lawrence.     Chronic migraine 02/25/2016   Per Dr Jaynee Eagles review in notes from [facility] from September 2015. Showed total headache days last month 18. Severe headache days 7 days. Moderate headache days 5 days. Mild headache days last month sick days. Days without headache last month 10 days. Symptoms associated with photophobia, phonophobia, osmophobia, neck pain, dizziness, jaw pain, nasal congestion, vision disturbances, tingling and numbness, weakness and worsening with activity. Each headache attack last 3 hours depending on treatment in severity. Left side, the right side, easier side, the frontal area in the back of the head. Characterized as throbbing, pressure, tightness, squeezing, stabbing and burning    Chronic migraine without aura 05/15/2013    Dr Jaynee Eagles [facility]   Chronic tonsillitis 01/27/2019   DDD (degenerative disc disease), cervical 11/18/2018   DDD (degenerative disc disease), lumbar    Depression    Disc displacement, lumbar    Dysrhythmia    seen by dr Wynonia Lawman- not a problem since she has been on Bystolic   Episodic cluster headache, not intractable 03/06/2017   Essential hypertension 05/15/2013   Family history of adverse reaction to anesthesia    Brother- N/V   Family history of premature CAD 05/15/2013   Fibromyalgia syndrome 07/01/2015   Management by Dr Chauncey Cruel. Devashwar (Rheum)    GERD (gastroesophageal reflux disease) 12/16/2014   H/O seasonal allergies    Hammer toe    Left great toe   Hashimoto's thyroiditis    Per patient, diagnosed by Dr. Modena Nunnery   Hearing loss of both ears 07/27/2015   mild to borderline moderate low frequency hearing loss improving to within normal limits bilaterally on audiology testing at [facility] in November 2016.     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   Hyperlipidemia 1998   Hypokalemia due to excessive gastrointestinal loss of potassium 07/28/2019   Hypothyroidism    Impairment of balance 02/2015   Consequent of postconcussive syndrome   Injury of triangular fibrocartilage complex of left wrist 02/25/2019   Dx 02/25/19 Iran Planas IV MD ([facility])   Insulin resistance 07/04/2017   Internal hemorrhoid 01/28/2021   Internal hemorrhoid seen on colonoscopy 10/2020 Warnell Bureau MD [facility])   Interstitial cystitis    Irritable bowel syndrome with diarrhea 04/03/2016   Left ventricular hypertrophy, mild 02/25/2016   ECHOcardiogram report 06/17/15 showing EF55-60%, mild LVH and G1DD    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[place]) 03/10/2018   [facility]   Lumbar facet joint pain    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   Mood disorder ([place])    Morbid obesity ([place]) 03/06/2017   Musculoskeletal neck pain 07/14/2015   Nocturnal hypoxemia 03/06/2017   Normal coronary arteries 05/14/2014   Obesity (BMI 30.0-34.9) 01/29/2017   Osteoarthritis of left hip 11/01/2015   MRI order by Dr Alvan Dame (ortho) 10/2015 showed significant arthritis of left hip joint with cystic changes in femoral head c/w osteoarthritis   Osteoarthritis  of 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, lumbar region 10/30/2011   Other insomnia 11/09/2016   Overweight    Pain in joint of left shoulder 11/09/2016   Pain in joint, multiple sites 11/18/2018   Palpitations 05/15/2013   [facility] manages   Periodic limb movement sleep disorder 03/28/2017   Perirectal cyst 05/07/2016   Poison ivy dermatitis 03/24/2021   PONV (postoperative nausea and vomiting)    Positive ANA (antinuclear antibody) 11/18/2018   Post concussion syndrome 06/06/2015   Post concussive syndrome 07/14/2015   Ms Schiele's post-concussive syndrome manifesting in vertigo and  headache, mood changes, poor balance, dizziness, and decreased concentration per Dr Jaynee Eagles at [facility].    Posterior vitreous detachment of right eye 2014   Rectal abscess 05/10/2021   S/P left THA, AA 07/25/2016   Shingles    Shortness of breath dyspnea    with exertion   Sicca syndrome ([place]) 11/18/2018   (+) ANA   Sleep walking and eating 03/06/2017   Snoring 03/06/2017   Spondylosis of lumbar region without myelopathy or radiculopathy 10/30/2011   TFC (triangular fibrocartilage complex) injury 02/25/2019   Thyroid nodule 08/11/2009   Findings: The thyroid gland is within normal limits in size.  The gland is diffusely inhomogeneous. A small solid nodule is noted in the lower pole  medially on the right of 7 x 6 x 8 mm. A small solid nodule is noted inferiorly on the left of 3 x 3 x 4 mm.  IMPRESSION:  The thyroid gland is within normal limits in size with only small solid nodules present, the largest of only 8 mm in diameter on the right.     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    Trochanteric bursitis, right hip 04/26/2020   Tubular adenoma of colon 01/28/2021   Colonoscopy screening 4 mm tubular Adenoma polyp Wilford Corner, MD [facility])   Tubular adenoma of colon 01/28/2021   Colonoscopy screening 4 mm tubular Adenoma polyp Wilford Corner, MD [facility])   Vasomotor symptoms due to menopause 04/19/2017   Vitamin D deficiency 05/08/2017   Vulvitis 07/22/2020   Yeast vaginitis 07/11/2019    Family History  Problem Relation Age of Onset   Alzheimer's disease Mother    Hyperlipidemia Mother    Hypertension Mother    Osteoporosis Mother    Parkinson's disease Mother    Migraines Sister    Allergies Sister    Hypertension Sister    Hyperlipidemia Brother    Cardiomyopathy Brother    Diabetes type II Brother    Kidney disease Brother    Asthma Brother    Heart disease Brother    Hypertension Brother     Hyperlipidemia Brother    Kidney disease Brother    Heart disease Brother    Heart disease Father    Hyperlipidemia Father    Hypertension Father    Aortic aneurysm Father    Early death Father 57   Diabetes type II Other    Breast cancer Maternal Aunt    Past Surgical History:  Procedure Laterality Date   Bladder dilitation     x 3   BREAST BIOPSY Right 2011   Benign histology   CARDIOVASCULAR STRESS TEST  2000   Unremarkable per pt report   CARPOMETACARPAL JOINT ARTHROTOMY Right 2011   COLONOSCOPY     COLONOSCOPY WITH PROPOFOL N/A 04/21/2015   Procedure: COLONOSCOPY WITH PROPOFOL;  Surgeon: Saralyn Pilar  Benson Norway, MD;  Location: [facility];  Service: Endoscopy;  Laterality: N/A;   EPIDURAL BLOCK INJECTION Left 04/12/2016   Left Medial Nerve Block and Left L5 ramus block, Dr Suella Broad    EPIDURAL BLOCK INJECTION  03/21/2016   Left L3-4 medial branch block and Left L5 & dorsal ramus block    EPIDURAL BLOCK INJECTION N/A 10/25/2016   Suella Broad, MD. Lumbar medial branch block   EPIDURAL BLOCK INJECTION N/A 02/09/2017   Suella Broad, MD.  Bilateral L3/4 medial branch block, bilateral L5 dorsal ramus block   EPIDURAL BLOCK INJECTION N/A 07/04/2017   Suella Broad, MD   FECAL TRANSPLANT  04/21/2015   Procedure: FECAL TRANSPLANT;  Surgeon: Carol Ada, MD;  Location: [facility] ENDOSCOPY;  Service: Endoscopy;;   HIP ARTHROPLASTY Left    HIP ARTHROSCOPY Left 03/06/2018   Left hip arthroplasty, redo for loose hip arthroplasty. Procedure at [place] Left 11/2015   for OA by Dr Suella Broad   [facility] IMPLANT PLACEMENT  04/2021   OTHER SURGICAL HISTORY Left 2016   Left L3/L4 medial nerve block and Left L5 Dorsal Ramus block Dr Mickel Duhamel   TOTAL HIP ARTHROPLASTY Left 07/25/2016   Procedure: LEFT TOTAL HIP ARTHROPLASTY ANTERIOR APPROACH;  Surgeon: Paralee Cancel, MD;  Location: [facility];  Service: Orthopedics;  Laterality: Left;   Social History    Social History Narrative   Prior PCP With [facility] [place], [place].   Married, lives with Lenwood, (b. 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    Wears seatbelt usually   No religious beliefs affecting healthcare   No difficulty taking medications as directed.       Home has working smoke alarm   No home throw rugs   Does not have nonslip bathtub / shower    Has railings on all stairs   Home is free from [place].    Right-handed.      No history of Hospitalization as of 07/2016.       Best number to reach patient [phone] (M) as of 07/01/15   It is permissible to leave messages as of 07/01/15 .    No regular exercise of 3 times a week for 30 minutes at a time      Chara Marquard has Advanced Directive and a Doctor, general practice is husband, Melissaann Dizdarevic [phone]      Caffeine: 2-3 cups coffee per day   Immunization History  Administered Date(s) Administered   Fluad Quad(high Dose 65+) 07/22/2020   Influenza Inj Mdck Quad Pf 08/12/2018   Influenza,inj,Quad PF,6+ Mos 07/01/2015, 05/10/2017, 06/26/2019   Influenza-Unspecified 09/13/2016, 08/12/2018   Moderna Sars-Covid-2 Vaccination 11/19/2019, 12/18/2019, 08/06/2020   Tdap 11/09/2011, 02/24/2016, 09/13/2016   Zoster Recombinat (Shingrix) 08/12/2018     Objective: Vital Signs: BP 116/82 (BP Location: Left Arm, Patient Position: Sitting, Cuff Size: Normal)   Pulse 80   Ht 5\' 6"  (1.676 m)   Wt 183 lb 12.8 oz (83.4 kg)   BMI 29.67 kg/m    Physical Exam Vitals and nursing note reviewed.  Constitutional:      Appearance: She is well-developed.  HENT:     Head: Normocephalic and atraumatic.  Eyes:     Conjunctiva/sclera: Conjunctivae normal.  Cardiovascular:     Rate and Rhythm: Normal rate and regular rhythm.     Heart sounds: Normal  heart sounds.  Pulmonary:     Effort: Pulmonary effort is normal.     Breath  sounds: Normal breath sounds.  Abdominal:     General: Bowel sounds are normal.     Palpations: Abdomen is soft.  Musculoskeletal:     Cervical back: Normal range of motion.  Lymphadenopathy:     Cervical: No cervical adenopathy.  Skin:    General: Skin is warm and dry.     Capillary Refill: Capillary refill takes less than 2 seconds.  Neurological:     Mental Status: She is alert and oriented to person, place, and time.  Psychiatric:        Behavior: Behavior normal.     Musculoskeletal Exam: C-spine was in good range of motion.  She had a spasm in bilateral trapezius region more prominent on the left side.  Shoulder joints, elbow joints, wrist joints, MCPs PIPs and DIPs with good range of motion with no synovitis.  Left hip joint is replaced.  Right hip joint was in good range of motion.  She had tenderness on palpation over right trochanteric bursa.  Knee joints were in good range of motion without any discomfort.  There was no tenderness over ankles or MTPs.  CDAI Exam: CDAI Score: -- Patient Global: --; Provider Global: -- Swollen: --; Tender: -- Joint Exam 06/21/2021   No joint exam has been documented for this visit   There is currently no information documented on the homunculus. Go to the Rheumatology activity and complete the homunculus joint exam.  Investigation: No additional findings.  Imaging: No results found.  Recent Labs: Lab Results  Component Value Date   WBC 6.4 08/04/2020   HGB 13.5 08/04/2020   PLT 262 08/04/2020   NA 142 08/04/2020   K 4.3 08/04/2020   CL 106 08/04/2020   CO2 21 08/04/2020   GLUCOSE 78 07/24/2019   BUN 14 08/04/2020   CREATININE 0.9 08/04/2020   BILITOT 0.5 02/12/2020   ALKPHOS 79 02/12/2020   AST 22 02/12/2020   ALT 18 02/12/2020   PROT 7.1 02/12/2020   ALBUMIN 4.4 08/04/2020   CALCIUM 9.6 08/04/2020   GFRAA 75 07/24/2019    Speciality Comments: No specialty comments available.  Procedures:  No procedures  performed Allergies: Sulfa antibiotics, Chlorhexidine gluconate, Codeine, Levofloxacin, Lyrica [pregabalin], Methylprednisolone, Prednisone, Betadine [povidone iodine], Latex, and Wellbutrin [bupropion]   Assessment / Plan:     Visit Diagnoses: Fibromyalgia-she continues to have some generalized pain and discomfort from fibromyalgia.  She experiences increased discomfort during the colder weather  Other fatigue-she continues to have fatigue.  Need for regular exercise was emphasized.  Other insomnia -patient states that she ran out of Ambien for 2 days and could not sleep for 2 nights.  She has tried different medications in the past and nothing worked.  She is on Ambien 5 mg 1 tablet by mouth at bedtime for insomnia.  Side effects of Ambien use were discussed.  Primary osteoarthritis of both hands-joint protection muscle strengthening was discussed.  Trochanteric bursitis of both hips-she has severe tenderness on palpation of her right trochanteric bursa.  IT band stretches were discussed.  A handout on IT band stretches was given.  I discouraged use of cortisone injection due to side effects from steroid use.  I also offered a cortisone injection if she had persistent symptoms.  Patient will contact me.  Status post left hip replacement - 2016,2018 doing well.  DDD (degenerative disc disease), cervical-she continues to have  neck discomfort.  She has been followed by Dr. Nelva Bush.  DDD (degenerative disc disease), lumbar-she has chronic lower back pain and is followed by Dr. Nelva Bush.  She had left trapezius spasm.  Stretching exercises were discussed.  I also discussed possible use of trigger point injection if her symptoms persist.  A handout on neck exercises was given.  Chronic pain syndrome - Mobic 15 mg po daily and tramadol as needed for pain relief prescribed by Dr. Nelva Bush.  History of depression - celexa 20 mg 1 tablet by mouth daily.  Patient states that her depression is not controlled  with Celexa anymore.  She has been having more mood changes.  I advised her to schedule an appointment with her PCP to discuss other treatment options.  IC (interstitial cystitis)-she continues to have some IC symptoms.  History of migraine  History of hypothyroidism  Vitamin D deficiency-her last vitamin D level was checked and 2019 which was normal.  She has been taking vitamin D supplement.  History of hypertension-blood pressure is normal today.  History of hyperlipidemia  Orders: No orders of the defined types were placed in this encounter.  No orders of the defined types were placed in this encounter.    Follow-Up Instructions: Return in about 6 months (around 12/20/2021) for Sjogren's, Osteoarthritis.   Bo Merino, MD  Note - This record has been created using Editor, commissioning.  Chart creation errors have been sought, but may not always  have been located. Such creation errors do not reflect on  the standard of medical care.

## 2021-06-11 ENCOUNTER — Other Ambulatory Visit: Payer: Self-pay | Admitting: Physician Assistant

## 2021-06-11 DIAGNOSIS — M797 Fibromyalgia: Secondary | ICD-10-CM

## 2021-06-13 ENCOUNTER — Telehealth: Payer: Self-pay

## 2021-06-13 NOTE — Telephone Encounter (Signed)
I called patient, patient verbalized understanding. 

## 2021-06-13 NOTE — Telephone Encounter (Signed)
Attempted to contact the patient and left message for patient to call the office.   Prescription refill request was not received until 06/11/2021 when we were out of the office. Refill request is in Dr. Arlean Hopping box to be signed and sent to the pharmacy.

## 2021-06-13 NOTE — Telephone Encounter (Signed)
Patient left a voicemail (Saturday, 06/11/21 at 9:00 am) stating the pharmacy has not received a new prescription for her Zolpidem.  Patient states this is a prescription that she gets every month and last night she took her last pill.  Patient states she will be without medication on Saturday and Sunday night.  Patient requested the prescription be sent to the pharmacy first thing Monday morning (06/13/21).

## 2021-06-13 NOTE — Telephone Encounter (Signed)
Next Visit: 06/21/2021   Last Visit: 06/22/2020   Last Fill: 05/13/2021   Dx:   Other insomnia   Current Dose per office note on 06/22/2020,  Ambien 5 mg 1 tablet by mouth at bedtime   Okay to refill Ambien?

## 2021-06-15 ENCOUNTER — Ambulatory Visit: Payer: Medicare Other | Admitting: Family Medicine

## 2021-06-21 ENCOUNTER — Encounter: Payer: Self-pay | Admitting: Rheumatology

## 2021-06-21 ENCOUNTER — Ambulatory Visit (INDEPENDENT_AMBULATORY_CARE_PROVIDER_SITE_OTHER): Payer: Medicare Other | Admitting: Rheumatology

## 2021-06-21 ENCOUNTER — Other Ambulatory Visit: Payer: Self-pay

## 2021-06-21 VITALS — BP 116/82 | HR 80 | Ht 66.0 in | Wt 183.8 lb

## 2021-06-21 DIAGNOSIS — M19042 Primary osteoarthritis, left hand: Secondary | ICD-10-CM

## 2021-06-21 DIAGNOSIS — M5030 Other cervical disc degeneration, unspecified cervical region: Secondary | ICD-10-CM

## 2021-06-21 DIAGNOSIS — Z8639 Personal history of other endocrine, nutritional and metabolic disease: Secondary | ICD-10-CM

## 2021-06-21 DIAGNOSIS — M19041 Primary osteoarthritis, right hand: Secondary | ICD-10-CM

## 2021-06-21 DIAGNOSIS — G4709 Other insomnia: Secondary | ICD-10-CM | POA: Diagnosis not present

## 2021-06-21 DIAGNOSIS — Z8669 Personal history of other diseases of the nervous system and sense organs: Secondary | ICD-10-CM

## 2021-06-21 DIAGNOSIS — R5383 Other fatigue: Secondary | ICD-10-CM

## 2021-06-21 DIAGNOSIS — M797 Fibromyalgia: Secondary | ICD-10-CM

## 2021-06-21 DIAGNOSIS — Z8679 Personal history of other diseases of the circulatory system: Secondary | ICD-10-CM

## 2021-06-21 DIAGNOSIS — M51369 Other intervertebral disc degeneration, lumbar region without mention of lumbar back pain or lower extremity pain: Secondary | ICD-10-CM

## 2021-06-21 DIAGNOSIS — E559 Vitamin D deficiency, unspecified: Secondary | ICD-10-CM

## 2021-06-21 DIAGNOSIS — M7062 Trochanteric bursitis, left hip: Secondary | ICD-10-CM

## 2021-06-21 DIAGNOSIS — G894 Chronic pain syndrome: Secondary | ICD-10-CM

## 2021-06-21 DIAGNOSIS — M7061 Trochanteric bursitis, right hip: Secondary | ICD-10-CM

## 2021-06-21 DIAGNOSIS — Z96641 Presence of right artificial hip joint: Secondary | ICD-10-CM

## 2021-06-21 DIAGNOSIS — Z8659 Personal history of other mental and behavioral disorders: Secondary | ICD-10-CM

## 2021-06-21 DIAGNOSIS — M5136 Other intervertebral disc degeneration, lumbar region: Secondary | ICD-10-CM

## 2021-06-21 DIAGNOSIS — Z96642 Presence of left artificial hip joint: Secondary | ICD-10-CM

## 2021-06-21 DIAGNOSIS — N3010 Interstitial cystitis (chronic) without hematuria: Secondary | ICD-10-CM

## 2021-06-21 NOTE — Patient Instructions (Signed)
Iliotibial Band Syndrome Rehab Ask your health care provider which exercises are safe for you. Do exercises exactly as told by your health care provider and adjust them as directed. It is normal to feel mild stretching, pulling, tightness, or discomfort as you do these exercises. Stop right away if you feel sudden pain or your pain gets significantly worse. Do not begin these exercises until told by your health care provider. Stretching and range-of-motion exercises These exercises warm up your muscles and joints and improve the movement andflexibility of your hip and pelvis. Quadriceps stretch, prone  Lie on your abdomen (prone position) on a firm surface, such as a bed or padded floor. Bend your left / right knee and reach back to hold your ankle or pant leg. If you cannot reach your ankle or pant leg, loop a belt around your foot and grab the belt instead. Gently pull your heel toward your buttocks. Your knee should not slide out to the side. You should feel a stretch in the front of your thigh and knee (quadriceps). Hold this position for __________ seconds. Repeat __________ times. Complete this exercise __________ times a day. Iliotibial band stretch An iliotibial band is a strong band of muscle tissue that runs from the outer side of your hip to the outer side of your thigh and knee. Lie on your side with your left / right leg in the top position. Bend both of your knees and grab your left / right ankle. Stretch out your bottom arm to help you balance. Slowly bring your top knee back so your thigh goes behind your trunk. Slowly lower your top leg toward the floor until you feel a gentle stretch on the outside of your left / right hip and thigh. If you do not feel a stretch and your knee will not fall farther, place the heel of your other foot on top of your knee and pull your knee down toward the floor with your foot. Hold this position for __________ seconds. Repeat __________ times.  Complete this exercise __________ times a day. Strengthening exercises These exercises build strength and endurance in your hip and pelvis. Enduranceis the ability to use your muscles for a long time, even after they get tired. Straight leg raises, side-lying This exercise strengthens the muscles that rotate the leg at the hip and move it away from your body (hip abductors). Lie on your side with your left / right leg in the top position. Lie so your head, shoulder, hip, and knee line up. You may bend your bottom knee to help you balance. Roll your hips slightly forward so your hips are stacked directly over each other and your left / right knee is facing forward. Tense the muscles in your outer thigh and lift your top leg 4-6 inches (10-15 cm). Hold this position for __________ seconds. Slowly lower your leg to return to the starting position. Let your muscles relax completely before doing another repetition. Repeat __________ times. Complete this exercise __________ times a day. Leg raises, prone This exercise strengthens the muscles that move the hips backward (hip extensors). Lie on your abdomen (prone position) on your bed or a firm surface. You can put a pillow under your hips if that is more comfortable for your lower back. Bend your left / right knee so your foot is straight up in the air. Squeeze your buttocks muscles and lift your left / right thigh off the bed. Do not let your back arch. Tense your thigh   muscle as hard as you can without increasing any knee pain. Hold this position for __________ seconds. Slowly lower your leg to return to the starting position and allow it to relax completely. Repeat __________ times. Complete this exercise __________ times a day. Hip hike Stand sideways on a bottom step. Stand on your left / right leg with your other foot unsupported next to the step. You can hold on to a railing or wall for balance if needed. Keep your knees straight and your  torso square. Then lift your left / right hip up toward the ceiling. Slowly let your left / right hip lower toward the floor, past the starting position. Your foot should get closer to the floor. Do not lean or bend your knees. Repeat __________ times. Complete this exercise __________ times a day. This information is not intended to replace advice given to you by your health care provider. Make sure you discuss any questions you have with your healthcare provider. Document Revised: 11/05/2019 Document Reviewed: 11/05/2019 [employer] Patient Education  2022 [employer] Cervical Strain and Sprain Rehab Ask your health care provider which exercises are safe for you. Do exercises exactly as told by your health care provider and adjust them as directed. It is normal to feel mild stretching, pulling, tightness, or discomfort as you do these exercises. Stop right away if you feel sudden pain or your pain gets worse. Do not begin these exercises until told by your health care provider. Stretching and range-of-motion exercises Cervical side bending  Using good posture, sit on a stable chair or stand up. Without moving your shoulders, slowly tilt your left / right ear to your shoulder until you feel a stretch in the opposite side neck muscles. You should be looking straight ahead. Hold for __________ seconds. Repeat with the other side of your neck. Repeat __________ times. Complete this exercise __________ times a day. Cervical rotation  Using good posture, sit on a stable chair or stand up. Slowly turn your head to the side as if you are looking over your left / right shoulder. Keep your eyes level with the ground. Stop when you feel a stretch along the side and the back of your neck. Hold for __________ seconds. Repeat this by turning to your other side. Repeat __________ times. Complete this exercise __________ times a day. Thoracic extension and pectoral stretch Roll a towel or a small blanket  so it is about 4 inches (10 cm) in diameter. Lie down on your back on a firm surface. Put the towel lengthwise, under your spine in the middle of your back. It should not be under your shoulder blades. The towel should line up with your spine from your middle back to your lower back. Put your hands behind your head and let your elbows fall out to your sides. Hold for __________ seconds. Repeat __________ times. Complete this exercise __________ times a day. Strengthening exercises Isometric upper cervical flexion Lie on your back with a thin pillow behind your head and a small rolled-up towel under your neck. Gently tuck your chin toward your chest and nod your head down to look toward your feet. Do not lift your head off the pillow. Hold for __________ seconds. Release the tension slowly. Relax your neck muscles completely before you repeat this exercise. Repeat __________ times. Complete this exercise __________ times a day. Isometric cervical extension  Stand about 6 inches (15 cm) away from a wall, with your back facing the wall. Place a soft   object, about 6-8 inches (15-20 cm) in diameter, between the back of your head and the wall. A soft object could be a small pillow, a ball, or a folded towel. Gently tilt your head back and press into the soft object. Keep your jaw and forehead relaxed. Hold for __________ seconds. Release the tension slowly. Relax your neck muscles completely before you repeat this exercise. Repeat __________ times. Complete this exercise __________ times a day. Posture and body mechanics Body mechanics refers to the movements and positions of your body while you do your daily activities. Posture is part of body mechanics. Good posture and healthy body mechanics can help to relieve stress in your body's tissues and joints. Good posture means that your spine is in its natural S-curve position (your spine is neutral), your shoulders are pulled back slightly, and your  head is not tipped forward. The following are general guidelines for applying improved posture andbody mechanics to your everyday activities. Sitting  When sitting, keep your spine neutral and keep your feet flat on the floor. Use a footrest, if necessary, and keep your thighs parallel to the floor. Avoid rounding your shoulders, and avoid tilting your head forward. When working at a desk or a computer, keep your desk at a height where your hands are slightly lower than your elbows. Slide your chair under your desk so you are close enough to maintain good posture. When working at a computer, place your monitor at a height where you are looking straight ahead and you do not have to tilt your head forward or downward to look at the screen.  Standing  When standing, keep your spine neutral and keep your feet about hip-width apart. Keep a slight bend in your knees. Your ears, shoulders, and hips should line up. When you do a task in which you stand in one place for a long time, place one foot up on a stable object that is 2-4 inches (5-10 cm) high, such as a footstool. This helps keep your spine neutral.  Resting When lying down and resting, avoid positions that are most painful for you. Try to support your neck in a neutral position. You can use a contour pillow or asmall rolled-up towel. Your pillow should support your neck but not push on it. This information is not intended to replace advice given to you by your health care provider. Make sure you discuss any questions you have with your healthcare provider. Document Revised: 12/18/2018 Document Reviewed: 05/29/2018 Else[employer] Patient Education  2022 [employer]  

## 2021-07-11 ENCOUNTER — Other Ambulatory Visit: Payer: Self-pay | Admitting: Rheumatology

## 2021-07-11 DIAGNOSIS — M797 Fibromyalgia: Secondary | ICD-10-CM

## 2021-07-11 NOTE — Telephone Encounter (Signed)
Next Visit: 12/20/2021  Last Visit: 06/21/2021  Last Fill:06/12/2021  Dx: Other insomnia  Current Dose per office note on 06/21/2021:  Ambien 5 mg 1 tablet by mouth at bedtime for insomnia  Okay to refill Ambien?

## 2021-08-02 ENCOUNTER — Other Ambulatory Visit: Payer: Self-pay | Admitting: Neurology

## 2021-08-02 ENCOUNTER — Telehealth: Payer: Self-pay | Admitting: Family Medicine

## 2021-08-02 MED ORDER — RIZATRIPTAN BENZOATE 10 MG PO TABS: 10.0000 mg | ORAL_TABLET | ORAL | 1 refills | Status: DC | PRN

## 2021-08-02 NOTE — Telephone Encounter (Signed)
Refill sent for the pt 

## 2021-08-02 NOTE — Telephone Encounter (Signed)
Refill sent to the pharmacy for the patient

## 2021-08-02 NOTE — Telephone Encounter (Signed)
Pt requesting refill for rizatriptan (MAXALT) 10 MG tablet. [place] [phone]

## 2021-08-11 ENCOUNTER — Other Ambulatory Visit: Payer: Self-pay | Admitting: Physician Assistant

## 2021-08-11 DIAGNOSIS — M797 Fibromyalgia: Secondary | ICD-10-CM

## 2021-08-11 NOTE — Telephone Encounter (Signed)
Next Visit: 12/20/2021   Last Visit: 06/21/2021   Last Fill:07/11/2021   Dx: Other insomnia   Current Dose per office note on 06/21/2021: Ambien 5 mg 1 tablet by mouth at bedtime for insomnia    Okay to refill Ambien?

## 2021-08-18 DIAGNOSIS — M461 Sacroiliitis, not elsewhere classified: Secondary | ICD-10-CM | POA: Insufficient documentation

## 2021-08-18 HISTORY — DX: Sacroiliitis, not elsewhere classified: M46.1

## 2021-08-31 ENCOUNTER — Other Ambulatory Visit (HOSPITAL_COMMUNITY): Payer: Self-pay

## 2021-09-08 ENCOUNTER — Other Ambulatory Visit: Payer: Self-pay | Admitting: Rheumatology

## 2021-09-08 DIAGNOSIS — M797 Fibromyalgia: Secondary | ICD-10-CM

## 2021-09-08 NOTE — Telephone Encounter (Signed)
Next Visit: 12/20/2021   Last Visit: 06/21/2021   Last Fill:08/11/2021   Dx: Other insomnia   Current Dose per office note on 06/21/2021: Ambien 5 mg 1 tablet by mouth at bedtime for insomnia   Okay to refill Ambien?

## 2021-09-11 HISTORY — PX: INTERSTIM IMPLANT REVISION: SHX5138

## 2021-09-15 ENCOUNTER — Other Ambulatory Visit: Payer: Self-pay | Admitting: Family Medicine

## 2021-09-15 ENCOUNTER — Encounter: Payer: Self-pay | Admitting: Family Medicine

## 2021-09-15 DIAGNOSIS — E038 Other specified hypothyroidism: Secondary | ICD-10-CM

## 2021-09-15 DIAGNOSIS — N763 Subacute and chronic vulvitis: Secondary | ICD-10-CM

## 2021-09-15 MED ORDER — NYSTATIN-TRIAMCINOLONE 100000-0.1 UNIT/GM-% EX CREA: 1.0000 "application " | TOPICAL_CREAM | Freq: Two times a day (BID) | CUTANEOUS | 0 refills | Status: DC

## 2021-09-20 ENCOUNTER — Encounter: Payer: Self-pay | Admitting: Neurology

## 2021-09-20 ENCOUNTER — Telehealth: Payer: Self-pay | Admitting: Neurology

## 2021-09-20 NOTE — Telephone Encounter (Signed)
PA submitted through [employer] KEY: [id number] Approved until 09/20/2022

## 2021-09-21 ENCOUNTER — Ambulatory Visit (INDEPENDENT_AMBULATORY_CARE_PROVIDER_SITE_OTHER): Payer: Medicare Other

## 2021-09-21 ENCOUNTER — Other Ambulatory Visit: Payer: Self-pay

## 2021-09-21 VITALS — Ht 66.5 in | Wt 180.0 lb

## 2021-09-21 DIAGNOSIS — Z0000 Encounter for general adult medical examination without abnormal findings: Secondary | ICD-10-CM

## 2021-09-21 NOTE — Progress Notes (Signed)
Subjective:   Molly Giles is a 66 y.o. female who presents for Medicare Annual (Subsequent) preventive examination.  Patient consented to have virtual visit and was identified by name and date of birth. Method of visit: Telephone  Encounter participants: Patient: Molly Giles - located at Home Nurse/Provider: Dorna Bloom - located at [facility] Others (if applicable): NA  Review of Systems: Defer to PCP Cardiac Risk Factors include: advanced age (>45men, >37 women)  Objective:   Vitals: Ht 5' 6.5" (1.689 m)    Wt 180 lb (81.6 kg)    BMI 28.62 kg/m   Body mass index is 28.62 kg/m.  Advanced Directives 09/21/2021 07/22/2020 07/24/2019 07/05/2019 04/01/2019 08/14/2017 07/05/2017  Does Patient Have a Medical Advance Directive? Yes Yes Yes No - Yes Yes  Type of Paramedic of [place];Living will Living will;Healthcare Power of [place];Living will - - [place];Living will [place];Living will  Does patient want to make changes to medical advance directive? No - Patient declined - - - - No - Patient declined -  Copy of [place] in Chart? No - copy requested No - copy requested No - copy requested - No - copy requested No - copy requested No - copy requested  Would patient like information on creating a medical advance directive? - - - - - - -   Tobacco Social History   Tobacco Use  Smoking Status Never  Smokeless Tobacco Never     Clinical Intake:  Pre-visit preparation completed: Yes  Pain Score: 4   How often do you need to have someone help you when you read instructions, pamphlets, or other written materials from your doctor or pharmacy?: 1 - Never What is the last grade level you completed in school?: Bachelors Degree  Past Medical History:  Diagnosis Date   Abdominal bloating 05/10/2021   Acute gastritis 11/19/2020   Allergic rhinoconjunctivitis  07/02/2015   Anal fissure 11/19/2020   Arthritis    Benign positional vertigo 04/2015   Responded well to Vestibular Rehab   Bruxism (teeth grinding)    Chronic contact dermatitis 08/06/2018   Per allergist Dr Pablo Lawrence.    Chronic migraine 02/25/2016   Per Dr Jaynee Eagles review in notes [facility] from September 2015. Showed total headache days last month 18. Severe headache days 7 days. Moderate headache days 5 days. Mild headache days last month sick days. Days without headache last month 10 days. Symptoms associated with photophobia, phonophobia, osmophobia, neck pain, dizziness, jaw pain, nasal congestion, vision disturbances, tingling and numbness, weakness and worsening with activity. Each headache attack last 3 hours depending on treatment in severity. Left side, the right side, easier side, the frontal area in the back of the head. Characterized as throbbing, pressure, tightness, squeezing, stabbing and burning    Chronic migraine without aura 05/15/2013    Dr Jaynee Eagles [facility]   Chronic tonsillitis 01/27/2019   DDD (degenerative disc disease), cervical 11/18/2018   DDD (degenerative disc disease), lumbar    Depression    Disc displacement, lumbar    Dysrhythmia    seen by dr Wynonia Lawman- not a problem since she has been on Bystolic   Episodic cluster headache, not intractable 03/06/2017   Essential hypertension 05/15/2013   Family history of adverse reaction to anesthesia    Brother- N/V   Family history of premature CAD 05/15/2013   Fibromyalgia syndrome 07/01/2015  Management by Dr Chauncey Cruel. Devashwar (Rheum)    GERD (gastroesophageal reflux disease) 12/16/2014   H/O seasonal allergies    Hammer toe    Left great toe   Hashimoto's thyroiditis    Per patient, diagnosed by Dr. Modena Nunnery   Hearing loss of both ears 07/27/2015   mild to borderline moderate low frequency hearing loss improving to within normal limits bilaterally on audiology testing at [facility] in November 2016.     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   Hyperlipidemia 1998   Hypokalemia due to excessive gastrointestinal loss of potassium 07/28/2019   Hypothyroidism    Impairment of balance 02/2015   Consequent of postconcussive syndrome   Injury of triangular fibrocartilage complex of left wrist 02/25/2019   Dx 02/25/19 Iran Planas IV MD ([facility])   Insulin resistance 07/04/2017   Internal hemorrhoid 01/28/2021   Internal hemorrhoid seen on colonoscopy 10/2020 Warnell Bureau MD [facility])   Interstitial cystitis    Irritable bowel syndrome with diarrhea 04/03/2016   Left ventricular hypertrophy, mild 02/25/2016   ECHOcardiogram report 06/17/15 showing EF55-60%, mild LVH and G1DD    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[place]) 03/10/2018   [facility]   Lumbar facet joint pain    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   Mood disorder ([place])    Morbid obesity ([place]) 03/06/2017   Musculoskeletal neck pain 07/14/2015   Nocturnal hypoxemia 03/06/2017   Normal coronary arteries 05/14/2014   Obesity (BMI 30.0-34.9) 01/29/2017   Osteoarthritis of left hip 11/01/2015   MRI order by Dr Alvan Dame (ortho) 10/2015 showed significant arthritis of left hip joint with cystic changes in femoral head c/w osteoarthritis   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, lumbar region 10/30/2011   Other insomnia 11/09/2016   Overweight    Pain in joint of left shoulder 11/09/2016   Pain in joint, multiple sites 11/18/2018   Palpitations 05/15/2013   [facility] manages   Periodic limb movement sleep disorder 03/28/2017   Perirectal cyst 05/07/2016   Poison ivy dermatitis 03/24/2021   PONV  (postoperative nausea and vomiting)    Positive ANA (antinuclear antibody) 11/18/2018   Post concussion syndrome 06/06/2015   Post concussive syndrome 07/14/2015   Ms Kinnan's post-concussive syndrome manifesting in vertigo and headache, mood changes, poor balance, dizziness, and decreased concentration per Dr Jaynee Eagles at [facility].    Posterior vitreous detachment of right eye 2014   Rectal abscess 05/10/2021   S/P left THA, AA 07/25/2016   Shingles    Shortness of breath dyspnea    with exertion   Sicca syndrome ([place]) 11/18/2018   (+) ANA   Sleep walking and eating 03/06/2017   Snoring 03/06/2017   Spondylosis of lumbar region without myelopathy or radiculopathy 10/30/2011   TFC (triangular fibrocartilage complex) injury 02/25/2019   Thyroid nodule 08/11/2009   Findings: The thyroid gland is within normal limits in size.  The gland is diffusely inhomogeneous. A small solid nodule is noted in the lower pole  medially on the right of 7 x 6 x 8 mm. A small solid nodule is noted inferiorly on the left of 3 x 3 x 4 mm.  IMPRESSION:  The  thyroid gland is within normal limits in size with only small solid nodules present, the largest of only 8 mm in diameter on the right.     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    Trochanteric bursitis, right hip 04/26/2020   Tubular adenoma of colon 01/28/2021   Colonoscopy screening 4 mm tubular Adenoma polyp Wilford Corner, MD [facility])   Tubular adenoma of colon 01/28/2021   Colonoscopy screening 4 mm tubular Adenoma polyp Wilford Corner, MD [facility])   Vasomotor symptoms due to menopause 04/19/2017   Vitamin D deficiency 05/08/2017   Vulvitis 07/22/2020   Yeast vaginitis 07/11/2019   Past Surgical History:  Procedure Laterality Date   Bladder dilitation     x 3   BREAST BIOPSY Right 2011   Benign histology   CARDIOVASCULAR STRESS TEST  2000   Unremarkable per pt report   CARPOMETACARPAL  JOINT ARTHROTOMY Right 2011   COLONOSCOPY     COLONOSCOPY WITH PROPOFOL N/A 04/21/2015   Procedure: COLONOSCOPY WITH PROPOFOL;  Surgeon: Carol Ada, MD;  Location: [facility] ENDOSCOPY;  Service: Endoscopy;  Laterality: N/A;   EPIDURAL BLOCK INJECTION Left 04/12/2016   Left Medial Nerve Block and Left L5 ramus block, Dr Suella Broad    EPIDURAL BLOCK INJECTION  03/21/2016   Left L3-4 medial branch block and Left L5 & dorsal ramus block    EPIDURAL BLOCK INJECTION N/A 10/25/2016   Suella Broad, MD. Lumbar medial branch block   EPIDURAL BLOCK INJECTION N/A 02/09/2017   Suella Broad, MD.  Bilateral L3/4 medial branch block, bilateral L5 dorsal ramus block   EPIDURAL BLOCK INJECTION N/A 07/04/2017   Suella Broad, MD   FECAL TRANSPLANT  04/21/2015   Procedure: FECAL TRANSPLANT;  Surgeon: Carol Ada, MD;  Location: [facility] ENDOSCOPY;  Service: Endoscopy;;   HIP ARTHROPLASTY Left    HIP ARTHROSCOPY Left 03/06/2018   Left hip arthroplasty, redo for loose hip arthroplasty. Procedure at [facility] Left 11/2015   for OA by Dr Suella Broad   [facility] IMPLANT PLACEMENT  04/2021   OTHER SURGICAL HISTORY Left 2016   Left L3/L4 medial nerve block and Left L5 Dorsal Ramus block Dr Mickel Duhamel   TOTAL HIP ARTHROPLASTY Left 07/25/2016   Procedure: LEFT TOTAL HIP ARTHROPLASTY ANTERIOR APPROACH;  Surgeon: Paralee Cancel, MD;  Location: [facility] ORS;  Service: Orthopedics;  Laterality: Left;   Family History  Problem Relation Age of Onset   Alzheimer's disease Mother    Hyperlipidemia Mother    Hypertension Mother    Osteoporosis Mother    Parkinson's disease Mother    Migraines Sister    Allergies Sister    Hypertension Sister    Hyperlipidemia Brother    Cardiomyopathy Brother    Diabetes type II Brother    Kidney disease Brother    Asthma Brother    Heart disease Brother    Hypertension Brother    Hyperlipidemia Brother    Kidney disease Brother    Heart disease  Brother    Heart disease Father    Hyperlipidemia Father    Hypertension Father    Aortic aneurysm Father    Early death Father 26   Diabetes type II Other    Breast cancer Maternal Aunt    Social History   Socioeconomic History   Marital status: Married    Spouse name: Tameko Halder   Number of children: 1  Years of education: 22   Highest education level: Bachelor's degree (e.g., BA, AB, BS)  Occupational History   Occupation: Psychologist, counselling: [employer]    Comment: Retired   Occupation: Retail buyer: [employer]    Comment: Part-time  Tobacco Use   Smoking status: Never   Smokeless tobacco: Never  Vaping Use   Vaping Use: Never used  Substance and Sexual Activity   Alcohol use: No    Alcohol/week: 0.0 standard drinks    Comment: No use in 30 years.   Drug use: No   Sexual activity: Yes    Partners: Male    Birth control/protection: None  Other Topics Concern   Not on file  Social History Narrative   Prior PCP With [facility] at [place], [place].   Married, lives with [place], (b. 1954)   Son in [place]. (2) grandchildren.   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    Wears seatbelt usually   No religious beliefs affecting healthcare   No difficulty taking medications as directed.       Home has working smoke alarm   No home throw rugs   Does not have nonslip bathtub / shower    Has railings on all stairs   Home is free from [place].    Right-handed.      Best number to reach patient [phone] (M) as of 09/21/2021.    No regular exercise of 3 times a week for 30 minutes at a time      Lester Platas has Scientist, physiological and a Doctor, general practice is husband, Keeanna Villafranca [phone]      Caffeine: 2-3 cups coffee per day   Social Determinants of Health   Financial Resource Strain: Low Risk    Difficulty of Paying Living  Expenses: Not hard at all  Food Insecurity: No Food Insecurity   Worried About Charity fundraiser in the Last Year: Never true   Arboriculturist in the Last Year: Never true  Transportation Needs: No Transportation Needs   Lack of Transportation (Medical): No   Lack of Transportation (Non-Medical): No  Physical Activity: Insufficiently Active   Days of Exercise per Week: 2 days   Minutes of Exercise per Session: 20 min  Stress: No Stress Concern Present   Feeling of Stress : Only a little  Social Connections: Moderately Isolated   Frequency of Communication with Friends and Family: More than three times a week   Frequency of Social Gatherings with Friends and Family: More than three times a week   Attends Religious Services: Never   Marine scientist or Organizations: No   Attends Archivist Meetings: Never   Marital Status: Married   Outpatient Encounter Medications as of 09/21/2021  Medication Sig   cetirizine (ZYRTEC) 10 MG tablet Take 10 mg by mouth daily as needed.   cholecalciferol (VITAMIN D3) 25 MCG (1000 UNIT) tablet Take 1,000 Units by mouth daily.   citalopram (CELEXA) 20 MG tablet TAKE 1 TABLET(20 MG) BY MOUTH DAILY   estradiol (ESTRACE) 0.1 MG/GM vaginal cream Apply one half gram (pea-sized amount) on fingertip and place intravaginally three times weekly.   EXTRA STRENGTH ACETAMINOPHEN PO Take by mouth.   levothyroxine (SYNTHROID) 125 MCG tablet TAKE 1/2 TABLET(62.5 MCG) BY MOUTH DAILY   Magnesium Citrate 100 MG TABS  Take by mouth.   meloxicam (MOBIC) 15 MG tablet Take by mouth as needed.   methocarbamol (ROBAXIN) 500 MG tablet Take 500 mg by mouth as needed.    NONFORMULARY OR COMPOUNDED ITEM daily. ALBUMIN eye drops- compounded   nystatin-triamcinolone (MYCOLOG II) cream Apply 1 application topically 2 (two) times daily.   Omega-3 Fatty Acids (FISH OIL PO) Take by mouth.   Phenazopyridine HCl (AZO URINARY PAIN PO) Take by mouth.   rosuvastatin  (CRESTOR) 10 MG tablet TAKE 1 TABLET(10 MG) BY MOUTH DAILY   traMADol (ULTRAM) 50 MG tablet Take 100 mg by mouth as needed.   Vitamin D-Vitamin K (K2 PLUS D3 PO) Take by mouth.   zolpidem (AMBIEN) 5 MG tablet TAKE 1 TABLET BY MOUTH EVERY DAY AT BEDTIME AS NEEDED FOR SLEEP   [DISCONTINUED] rizatriptan (MAXALT) 10 MG tablet Take 1 tablet (10 mg total) by mouth as needed for migraine. Take 1 tablet at onset of migraine. May repeat in 2 hours if needed. (Do not exceed more than 2 tab in 24 hours)   D-MANNOSE PO Take by mouth.   [DISCONTINUED] Ascorbic Acid (VITAMIN C PO) Take by mouth.   [DISCONTINUED] Atogepant (QULIPTA) 60 MG TABS Take 60 mg by mouth daily. (Patient not taking: Reported on 06/21/2021)   No facility-administered encounter medications on file as of 09/21/2021.   Activities of Daily Living In your present state of health, do you have any difficulty performing the following activities: 09/21/2021  Hearing? N  Vision? N  Difficulty concentrating or making decisions? N  Walking or climbing stairs? N  Dressing or bathing? N  Doing errands, shopping? N  Preparing Food and eating ? N  Using the Toilet? N  In the past six months, have you accidently leaked urine? N  Do you have problems with loss of bowel control? N  Managing your Medications? N  Managing your Finances? N  Housekeeping or managing your Housekeeping? N  Some recent data might be hidden   Patient Care Team: McDiarmid, Blane Ohara, MD as PCP - General (Family Medicine) Roseanne Kaufman, MD as Consulting Physician (Orthopedic Surgery) Troy Sine, MD as Consulting Physician (Cardiology) Suella Broad, MD as Consulting Physician (Physical Medicine and Rehabilitation) Carol Ada, MD as Consulting Physician (Gastroenterology) Christy Sartorius, MD as Consulting Physician (Urology) Melvenia Beam, MD as Consulting Physician (Neurology) Carlyle Basques, MD as Consulting Physician (Infectious Diseases) Bo Merino, MD as Consulting Physician (Rheumatology) Mervyn Gay, PT as Physical Therapist (Physical Therapy) Louretta Shorten, MD as Consulting Physician (Obstetrics and Gynecology) Thornell Sartorius, MD as Consulting Physician (Otolaryngology) [facility] as Consulting Physician Paralee Cancel, MD as Consulting Physician (Orthopedic Surgery) Pablo Lawrence as Consulting Physician (Allergy and Immunology) Rosita Kea, PA-C (Inactive) as Consulting Physician (Family Medicine)    Assessment:   This is a routine wellness examination for [place].  Exercise Activities and Dietary recommendations Current Exercise Habits: Home exercise routine, Type of exercise: stretching, Time (Minutes): 20, Frequency (Times/Week): 2, Weekly Exercise (Minutes/Week): 40, Intensity: Moderate, Exercise limited by: neurologic condition(s)   Goals      Blood Pressure < 140/90     Weight < 200 lb (90.719 kg)     Has tried multiple diet programs in past without persistent success.  Has consulted with nutritionist in past.        Fall Risk Fall Risk  09/21/2021 11/26/2019 08/14/2017 07/02/2015 07/02/2015  Falls in the past year? 0 0 No - No  Number falls  in past yr: 0 - - - -  Injury with Fall? 0 - - - -  Risk for fall due to : Orthopedic patient;Other (Comment) - - Impaired balance/gait History of fall(s)  Risk for fall due to: Comment Dizziness - - - -  Follow up Falls prevention discussed Falls evaluation completed - - -   Patient reports normal gait and balance. Patient does report intermittent dizziness. Patient does not use assistive devices to ambulate.  Is the patient's home free of loose throw rugs in walkways, pet beds, electrical cords, etc?   yes      Grab bars in the bathroom? yes      Handrails on the stairs?   yes      Adequate lighting?   yes  Patient rating of health (0-10) scale: 6   Depression Screen PHQ 2/9 Scores 09/21/2021 07/22/2020 11/26/2019 07/24/2019  PHQ - 2  Score 0 0 1 2  PHQ- 9 Score - 5 - -  Exception Documentation - - - (No Data)    Cognitive Function MMSE - Mini Mental State Exam 02/25/2016  Orientation to time (No Data)  Orientation to time comments Testing: Ms Stange named 18 different animal types in 50 seconds WNL   6CIT Screen 09/21/2021  What Year? 0 points  What month? 0 points  What time? 0 points  Count back from 20 0 points  Months in reverse 0 points  Repeat phrase 0 points  Total Score 0   Immunization History  Administered Date(s) Administered   Fluad Quad(high Dose 65+) 07/22/2020   Influenza Inj Mdck Quad Pf 08/12/2018   Influenza,inj,Quad PF,6+ Mos 07/01/2015, 05/10/2017, 06/26/2019   Influenza-Unspecified 09/13/2016, 08/12/2018   Moderna Sars-Covid-2 Vaccination 11/19/2019, 12/18/2019, 08/06/2020   Pfizer Covid-19 Vaccine Bivalent Booster 60yrs & up 08/23/2021   Tdap 11/09/2011, 02/24/2016, 09/13/2016   Zoster Recombinat (Shingrix) 08/12/2018   Qualifies for Shingles Vaccine? Yes   Shingrix Completed: #1 dose. Patient is due for #2 dose. Education has been provided regarding the importance of this vaccine. Advised may receive this vaccine at local pharmacy or Health Dept. Aware to provide a copy of the vaccination record if obtained from local pharmacy or Health Dept. Verbalized acceptance and understanding.  Screening Tests Health Maintenance  Topic Date Due   Pneumonia Vaccine 77+ Years old (1 - PCV) Never done   Zoster Vaccines- Shingrix (2 of 2) 10/07/2018   COLONOSCOPY (Pts 45-37yrs Insurance coverage will need to be confirmed)  04/20/2020   INFLUENZA VACCINE  04/11/2021   MAMMOGRAM  03/12/2023   DEXA SCAN  05/12/2024   TETANUS/TDAP  09/13/2026   COVID-19 Vaccine  Completed   Hepatitis C Screening  Completed   HPV VACCINES  Aged Out   Cancer Screenings: Lung: Low Dose CT Chest recommended if Age 65-80 years, 20 pack-year currently smoking OR have quit w/in 15years. Patient does not qualify. Breast:   Up to date on Mammogram? Yes   Up to date of Bone Density/Dexa? Yes Colorectal: Patient reports 2022- Will needs to get [facility] records.  Additional Screenings: Hepatitis C Screening: Completed  HIV Screening: Completed  Plan:  PCP apt scheduled for 1/31. #2 shingles vaccine is due. You can get this at your pharmacy.   I have personally reviewed and noted the following in the patients chart:   Medical and social history Use of alcohol, tobacco or illicit drugs  Current medications and supplements Functional ability and status Nutritional status Physical activity Advanced directives List of other physicians  Hospitalizations, surgeries, and ER visits in previous 12 months Vitals Screenings to include cognitive, depression, and falls Referrals and appointments  In addition, I have reviewed and discussed with patient certain preventive protocols, quality metrics, and best practice recommendations. A written personalized care plan for preventive services as well as general preventive health recommendations were provided to patient.  Dorna Bloom, [place]  09/28/2021

## 2021-09-27 ENCOUNTER — Ambulatory Visit (INDEPENDENT_AMBULATORY_CARE_PROVIDER_SITE_OTHER): Payer: Medicare Other | Admitting: Family Medicine

## 2021-09-27 ENCOUNTER — Other Ambulatory Visit: Payer: Self-pay

## 2021-09-27 ENCOUNTER — Encounter: Payer: Self-pay | Admitting: Family Medicine

## 2021-09-27 VITALS — BP 122/98 | HR 113 | Ht 66.5 in | Wt 180.0 lb

## 2021-09-27 DIAGNOSIS — G8929 Other chronic pain: Secondary | ICD-10-CM

## 2021-09-27 DIAGNOSIS — R42 Dizziness and giddiness: Secondary | ICD-10-CM | POA: Diagnosis not present

## 2021-09-27 DIAGNOSIS — G43009 Migraine without aura, not intractable, without status migrainosus: Secondary | ICD-10-CM

## 2021-09-27 DIAGNOSIS — M542 Cervicalgia: Secondary | ICD-10-CM

## 2021-09-27 MED ORDER — RIZATRIPTAN BENZOATE 10 MG PO TABS: 10.0000 mg | ORAL_TABLET | ORAL | 11 refills | Status: DC | PRN

## 2021-09-27 MED ORDER — EMGALITY 120 MG/ML ~~LOC~~ SOAJ: 120.0000 mg | SUBCUTANEOUS | 3 refills | Status: DC

## 2021-09-27 NOTE — Patient Instructions (Signed)
Below is our plan:  We will continue Emgality monthly and rizatriptan as needed. Please follow up with PCP if vertigo doesn't get better. I will refer you back to vestibular therapy. Call Dr Nelva Bush for follow up for neck pain. Ask him about starting gabapentin for tingling. Sometimes gabapentin can help with headaches as well.   Please make sure you are staying well hydrated. I recommend 50-60 ounces daily. Well balanced diet and regular exercise encouraged. Consistent sleep schedule with 6-8 hours recommended.   Please continue follow up with care team as directed.   Follow up with me in 1 year   You may receive a survey regarding today's visit. I encourage you to leave honest feed back as I do use this information to improve patient care. Thank you for seeing me today!

## 2021-09-27 NOTE — Progress Notes (Signed)
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 railings on all stairs   Home is free from [place].    Right-handed.      No history of Hospitalization as of 07/2016.       Best number to reach patient [phone] (M) as of 07/01/15   It is permissible to leave messages as of 07/01/15 .    No regular exercise of 3 times a week for 30 minutes at a time      Cia Garretson has Scientist, physiological and a Doctor, general practice is husband, Lizvet Chunn [phone]      Caffeine: 2-3 cups coffee per day   Social Determinants of Health   Financial Resource Strain: Low Risk    Difficulty of Paying Living Expenses: Not hard at all  Food Insecurity: No Food Insecurity   Worried About Charity fundraiser in the Last Year: Never true   Arboriculturist in the Last Year: Never true  Transportation Needs: No Transportation Needs   Lack of Transportation (Medical): No   Lack of Transportation (Non-Medical): No  Physical Activity: Insufficiently Active   Days of Exercise per Week: 2 days   Minutes of Exercise per  Session: 20 min  Stress: No Stress Concern Present   Feeling of Stress : Only a little  Social Connections: Moderately Isolated   Frequency of Communication with Friends and Family: More than three times a week   Frequency of Social Gatherings with Friends and Family: More than three times a week   Attends Religious Services: Never   Marine scientist or Organizations: No   Attends Archivist Meetings: Never   Marital Status: Married  Human resources officer Violence: Not At Risk   Fear of Current or Ex-Partner: No   Emotionally Abused: No   Physically Abused: No   Sexually Abused: No      PHYSICAL EXAM  Vitals:   09/27/21 1359  BP: (!) 122/98  Pulse: (!) 113  Weight: 180 lb (81.6 kg)  Height: 5' 6.5" (1.689 m)     Body mass index is 28.62 kg/m.  Repeat BP 140/88 manually in office.   Generalized: Well developed, in no acute distress  Cardiology: normal rate and rhythm, no murmur noted Respiratory: clear to auscultation bilaterally  Neurological examination  Mentation: Alert oriented to time, place, history taking. Follows all commands speech and language fluent Cranial nerve II-XII: Pupils were equal round reactive to light. Extraocular movements were full, visual field were full  Motor: The motor testing reveals 5 over 5 strength of all 4 extremities. Good symmetric motor tone is noted throughout.  Gait and station: Gait is normal.  DTR: brisk but symmetric in bilateral upper extremities and patellars   DIAGNOSTIC DATA (LABS,  IMAGING, TESTING) - I reviewed patient records, labs, notes, testing and imaging myself where available.  MMSE - Mini Mental State Exam 02/25/2016  Orientation to time (No Data)  Orientation to time comments Testing: Molly Giles named 18 different animal types in 50 seconds WNL     Lab Results  Component Value Date   WBC 6.4 08/04/2020   HGB 13.5 08/04/2020   HCT 41 08/04/2020   MCV 95.0 07/05/2019   PLT 262 08/04/2020       Component Value Date/Time   NA 142 08/04/2020 0000   K 4.3 08/04/2020 0000   CL 106 08/04/2020 0000   CO2 21 08/04/2020 0000   GLUCOSE 78 07/24/2019 1116   GLUCOSE 93 07/05/2019 1629   BUN 14 08/04/2020 0000   CREATININE 0.9 08/04/2020 0000   CREATININE 0.93 07/24/2019 1116   CREATININE 0.74 02/24/2016 1158   CALCIUM 9.6 08/04/2020 0000   PROT 7.1 02/12/2020 1056   ALBUMIN 4.4 08/04/2020 0000   ALBUMIN 4.7 02/12/2020 1056   AST 22 02/12/2020 1056   ALT 18 02/12/2020 1056   ALKPHOS 79 02/12/2020 1056   BILITOT 0.5 02/12/2020 1056   GFRNONAA 65 07/24/2019 1116   GFRNONAA 88 02/24/2016 1158   GFRAA 75 07/24/2019 1116   GFRAA >89 02/24/2016 1158   Lab Results  Component Value Date   CHOL 170 08/04/2020   HDL 53 08/04/2020   LDLCALC 100 08/04/2020   TRIG 92 08/04/2020   CHOLHDL 3.1 02/12/2020   Lab Results  Component Value Date   HGBA1C 5.2 06/26/2019   Lab Results  Component Value Date   VITAMINB12 448 03/22/2017   Lab Results  Component Value Date   TSH 1.73 08/04/2020       ASSESSMENT AND PLAN 66 y.o. year old female  has a past medical history of Abdominal bloating (05/10/2021), Acute gastritis (11/19/2020), Allergic rhinoconjunctivitis (07/02/2015), Anal fissure (11/19/2020), Arthritis, Benign positional vertigo (04/2015), Bruxism (teeth grinding), Chronic contact dermatitis (08/06/2018), Chronic migraine (02/25/2016), Chronic migraine without aura (05/15/2013), Chronic tonsillitis (01/27/2019), DDD (degenerative disc disease), cervical (11/18/2018), DDD (degenerative disc disease), lumbar, Depression, Disc displacement, lumbar, Dysrhythmia, Episodic cluster headache, not intractable (03/06/2017), Essential hypertension (05/15/2013), Family history of adverse reaction to anesthesia, Family history of premature CAD (05/15/2013), Fibromyalgia syndrome (07/01/2015), GERD (gastroesophageal reflux disease) (12/16/2014), H/O seasonal allergies, Hammer toe, Hashimoto's thyroiditis,  Hearing loss of both ears (07/27/2015), History of Clostridium difficile colitis (07/01/2015), History of colonic polyps, 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), History of revision of total hip arthroplasty (04/10/2018), bad fall (02/2015), Hyperhidrosis, scalp, 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), Hyperlipidemia (1998), Hypokalemia due to excessive gastrointestinal loss of potassium (07/28/2019), Hypothyroidism, Impairment of balance (02/2015), Injury of triangular fibrocartilage complex of left wrist (02/25/2019), Insulin resistance (07/04/2017), Internal hemorrhoid (01/28/2021), Interstitial cystitis, Irritable bowel syndrome with diarrhea (04/03/2016), Left ventricular hypertrophy, mild (02/25/2016), 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[place]) (03/10/2018), Lumbar facet joint pain, 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), Mood disorder ([place]), Morbid obesity ([place]) (03/06/2017), Musculoskeletal neck pain (07/14/2015), Nocturnal hypoxemia (03/06/2017), Normal coronary arteries (05/14/2014), Obesity (BMI 30.0-34.9) (01/29/2017), Osteoarthritis of left hip (11/01/2015), 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, lumbar region (10/30/2011), Other insomnia (11/09/2016), Overweight, Pain in joint of left shoulder (11/09/2016), Pain in joint, multiple sites (11/18/2018), Palpitations (05/15/2013), 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), Perirectal cyst (05/07/2016), Poison ivy dermatitis (03/24/2021), PONV (postoperative nausea and vomiting), Positive ANA (antinuclear antibody) (11/18/2018), Post concussion syndrome (06/06/2015), Post concussive syndrome (07/14/2015), Posterior vitreous detachment of right eye (2014), Rectal abscess (05/10/2021), S/P left  THA, 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), Shingles, Shortness of breath dyspnea, 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 here with     ICD-10-CM   1. Migraine without aura and without status migrainosus, not intractable  G43.009     2. Vertigo  R42 Ambulatory referral to Physical Therapy       Molly Giles has felt migraines are best managed on Emgality. She is able to afford cost at this time. We will continue 1 injection every 30 days and rizatriptan as needed.  We have reviewed possibility to return to propranolol versus topiramate if needed. She was encouraged to follow up closely with Dr Nelva Bush. Neck etiology could very well contribute to headaches. We have discussed possibility of using gabapentin to help with neck pain and headaches if Dr Nelva Bush agrees. I will send her to vestibular therapy for recurrent vertigo. She will follow up closely with PCP for concerns of elevated BP. She will continue healthy lifestyle habits. She will follow-up with me in 1 year, sooner if needed.  She verbalizes understanding and agreement with this plan.   Orders Placed This Encounter  Procedures   Ambulatory referral to Physical Therapy    Referral Priority:   Routine    Referral Type:   Physical Medicine    Referral Reason:   Specialty Services Required    Requested Specialty:   Physical Therapy    Number of Visits Requested:   1      Meds ordered this encounter  Medications   rizatriptan (MAXALT) 10 MG tablet    Sig: Take 1 tablet (10 mg total) by mouth as needed for migraine. Take 1 tablet at onset of migraine. May repeat in 2 hours if needed. (Do not exceed more than 2 tab in 24 hours)    Dispense:  10 tablet    Refill:  11    Order Specific Question:   Supervising Provider    Answer:    Melvenia Beam [[id number]]   Galcanezumab-gnlm (EMGALITY) 120 MG/ML SOAJ    Sig: Inject 120 mg into the skin every 30 (thirty) days.    Dispense:  3 mL    Refill:  3    Order Specific Question:   Supervising Provider    Answer:   Melvenia Beam [[id number]]      MVE HMCNO, FNP-C 09/27/2021, 2:37 PM [facility] [address] [place],  [address] [phone]

## 2021-09-28 ENCOUNTER — Other Ambulatory Visit: Payer: Self-pay | Admitting: *Deleted

## 2021-09-28 MED ORDER — CITALOPRAM HYDROBROMIDE 20 MG PO TABS: ORAL_TABLET | ORAL | 0 refills | Status: DC

## 2021-09-28 NOTE — Telephone Encounter (Signed)
Refill request received via fax  Next Visit: 12/20/2021  Last Visit: 06/21/2021  Last Fill: 05/30/2021  Dx: History of depression   Current Dose per office note on 06/22/99: celexa 20 mg 1 tablet by mouth daily  Okay to refill Celexa?

## 2021-09-28 NOTE — Patient Instructions (Signed)
You spoke to Molly Giles, [place] over the phone for your annual wellness visit.  We discussed goals:   Goals      Blood Pressure < 140/90     Weight < 200 lb (90.719 kg)     Has tried multiple diet programs in past without persistent success.  Has consulted with nutritionist in past.        We also discussed recommended health maintenance. As discussed, you are due for: Health Maintenance  Topic Date Due   Pneumonia Vaccine 105+ Years old (1 - PCV) Never done   Zoster Vaccines- Shingrix (2 of 2) 10/07/2018   COLONOSCOPY (Pts 45-84yr Insurance coverage will need to be confirmed)  04/20/2020   INFLUENZA VACCINE  04/11/2021   MAMMOGRAM  03/12/2023   DEXA SCAN  05/12/2024   TETANUS/TDAP  09/13/2026   COVID-19 Vaccine  Completed   Hepatitis C Screening  Completed   HPV VACCINES  Aged Out   PCP apt scheduled for 1/31. #2 shingles vaccine is due. You can get this at your pharmacy.   Preventive Care 668Years and Older, Female Preventive care refers to lifestyle choices and visits with your health care provider that can promote health and wellness. Preventive care visits are also called wellness exams. What can I expect for my preventive care visit? Counseling Your health care provider may ask you questions about your: Medical history, including: Past medical problems. Family medical history. Pregnancy and menstrual history. History of falls. Current health, including: Memory and ability to understand (cognition). Emotional well-being. Home life and relationship well-being. Sexual activity and sexual health. Lifestyle, including: Alcohol, nicotine or tobacco, and drug use. Access to firearms. Diet, exercise, and sleep habits. Work and work eStatistician Sunscreen use. Safety issues such as seatbelt and bike helmet use. Physical exam Your health care provider will check your: Height and weight. These may be used to calculate your BMI (body mass index). BMI is a measurement  that tells if you are at a healthy weight. Waist circumference. This measures the distance around your waistline. This measurement also tells if you are at a healthy weight and may help predict your risk of certain diseases, such as type 2 diabetes and high blood pressure. Heart rate and blood pressure. Body temperature. Skin for abnormal spots. What immunizations do I need? Vaccines are usually given at various ages, according to a schedule. Your health care provider will recommend vaccines for you based on your age, medical history, and lifestyle or other factors, such as travel or where you work. What tests do I need? Screening Your health care provider may recommend screening tests for certain conditions. This may include: Lipid and cholesterol levels. Hepatitis C test. Hepatitis B test. HIV (human immunodeficiency virus) test. STI (sexually transmitted infection) testing, if you are at risk. Lung cancer screening. Colorectal cancer screening. Diabetes screening. This is done by checking your blood sugar (glucose) after you have not eaten for a while (fasting). Mammogram. Talk with your health care provider about how often you should have regular mammograms. BRCA-related cancer screening. This may be done if you have a family history of breast, ovarian, tubal, or peritoneal cancers. Bone density scan. This is done to screen for osteoporosis. Talk with your health care provider about your test results, treatment options, and if necessary, the need for more tests. Follow these instructions at home: Eating and drinking  Eat a diet that includes fresh fruits and vegetables, whole grains, lean protein, and low-fat dairy products. Limit  your intake of foods with high amounts of sugar, saturated fats, and salt. Take vitamin and mineral supplements as recommended by your health care provider. Do not drink alcohol if your health care provider tells you not to drink. If you drink  alcohol: Limit how much you have to 0-1 drink a day. Know how much alcohol is in your drink. In the [place] one drink equals one 12 oz bottle of beer (355 mL), one 5 oz glass of wine (148 mL), or one 1 oz glass of hard liquor (44 mL). Lifestyle Brush your teeth every morning and night with fluoride toothpaste. Floss one time each day. Exercise for at least 30 minutes 5 or more days each week. Do not use any products that contain nicotine or tobacco. These products include cigarettes, chewing tobacco, and vaping devices, such as e-cigarettes. If you need help quitting, ask your health care provider. Do not use drugs. If you are sexually active, practice safe sex. Use a condom or other form of protection in order to prevent STIs. Take aspirin only as told by your health care provider. Make sure that you understand how much to take and what form to take. Work with your health care provider to find out whether it is safe and beneficial for you to take aspirin daily. Ask your health care provider if you need to take a cholesterol-lowering medicine (statin). Find healthy ways to manage stress, such as: Meditation, yoga, or listening to music. Journaling. Talking to a trusted person. Spending time with friends and family. Minimize exposure to UV radiation to reduce your risk of skin cancer. Safety Always wear your seat belt while driving or riding in a vehicle. Do not drive: If you have been drinking alcohol. Do not ride with someone who has been drinking. When you are tired or distracted. While texting. If you have been using any mind-altering substances or drugs. Wear a helmet and other protective equipment during sports activities. If you have firearms in your house, make sure you follow all gun safety procedures. What's next? Visit your health care provider once a year for an annual wellness visit. Ask your health care provider how often you should have your eyes and teeth checked. Stay up  to date on all vaccines. This information is not intended to replace advice given to you by your health care provider. Make sure you discuss any questions you have with your health care provider. Document Revised: 02/23/2021 Document Reviewed: 02/23/2021 Elsevier Patient Education  2022 [facility]'s number is [phone]. Please call with questions or concerns about what we discussed today.

## 2021-09-30 NOTE — Progress Notes (Signed)
I have reviewed this visit and agree with the documentation.   

## 2021-10-02 ENCOUNTER — Other Ambulatory Visit: Payer: Self-pay | Admitting: Physician Assistant

## 2021-10-09 ENCOUNTER — Other Ambulatory Visit: Payer: Self-pay | Admitting: Physician Assistant

## 2021-10-09 DIAGNOSIS — M797 Fibromyalgia: Secondary | ICD-10-CM

## 2021-10-10 ENCOUNTER — Encounter: Payer: Self-pay | Admitting: Family Medicine

## 2021-10-10 NOTE — Telephone Encounter (Signed)
Next Visit: 12/20/2021   Last Visit: 06/21/2021   Last Fill:09/08/2021   Dx: Other insomnia   Current Dose per office note on 06/21/2021: Ambien 5 mg 1 tablet by mouth at bedtime for insomnia   Okay to refill Ambien?

## 2021-10-11 ENCOUNTER — Ambulatory Visit (INDEPENDENT_AMBULATORY_CARE_PROVIDER_SITE_OTHER): Payer: Medicare Other | Admitting: Family Medicine

## 2021-10-11 ENCOUNTER — Encounter: Payer: Self-pay | Admitting: Family Medicine

## 2021-10-11 ENCOUNTER — Other Ambulatory Visit: Payer: Self-pay

## 2021-10-11 VITALS — BP 116/86 | HR 99 | Ht 66.5 in | Wt 184.2 lb

## 2021-10-11 DIAGNOSIS — E038 Other specified hypothyroidism: Secondary | ICD-10-CM | POA: Diagnosis not present

## 2021-10-11 DIAGNOSIS — F39 Unspecified mood [affective] disorder: Secondary | ICD-10-CM | POA: Diagnosis not present

## 2021-10-11 DIAGNOSIS — R1319 Other dysphagia: Secondary | ICD-10-CM

## 2021-10-11 DIAGNOSIS — R1084 Generalized abdominal pain: Secondary | ICD-10-CM

## 2021-10-11 DIAGNOSIS — R1310 Dysphagia, unspecified: Secondary | ICD-10-CM | POA: Insufficient documentation

## 2021-10-11 DIAGNOSIS — K580 Irritable bowel syndrome with diarrhea: Secondary | ICD-10-CM

## 2021-10-11 DIAGNOSIS — Z23 Encounter for immunization: Secondary | ICD-10-CM

## 2021-10-11 DIAGNOSIS — E7800 Pure hypercholesterolemia, unspecified: Secondary | ICD-10-CM

## 2021-10-11 DIAGNOSIS — Z79899 Other long term (current) drug therapy: Secondary | ICD-10-CM | POA: Diagnosis not present

## 2021-10-11 HISTORY — DX: Dysphagia, unspecified: R13.10

## 2021-10-11 MED ORDER — DICYCLOMINE HCL 10 MG PO CAPS: 10.0000 mg | ORAL_CAPSULE | Freq: Three times a day (TID) | ORAL | 99 refills | Status: DC | PRN

## 2021-10-11 NOTE — Assessment & Plan Note (Signed)
Established problem Chronic condition that waxes and wanes Feels like food sticks in mid-chest history of GER No difficulty with liquids No history of EGD No alcohol or tobacco No weight loss  A/ Esophageal dysphagia to solids, chronic   P/ Referral to GI, Dr Michail Sermon.

## 2021-10-11 NOTE — Patient Instructions (Signed)
Referral to Dr Darnell Level. Plovsky (Pschiatry) to look into medications for anxiety  Referral to Dr Michail Sermon (GI) to look into the difficulty swallowing and the abdominal discomfort.   Refilled Dicyclomine.  Checking your cholesterol and kidneys today.

## 2021-10-11 NOTE — Progress Notes (Signed)
Molly Giles is alone Sources of clinical information for visit is/are patient and past medical records. Nursing assessment for this office visit was reviewed with the patient for accuracy and revision.     Previous Report(s) Reviewed:  bladder stim op note Depression screen PHQ 2/9 10/11/2021  Decreased Interest 1  Down, Depressed, Hopeless 1  PHQ - 2 Score 2  Altered sleeping 3  Tired, decreased energy 2  Change in appetite 1  Feeling bad or failure about yourself  1  Trouble concentrating 1  Moving slowly or fidgety/restless 0  Suicidal thoughts 0  PHQ-9 Score 10  Difficult doing work/chores -  Some recent data might be hidden   Physiological scientist Office Visit from 10/11/2021 in [place] Office Visit from 07/22/2020 in [place] Office Visit from 03/22/2017 in [place]  Thoughts that you would be better off dead, or of hurting yourself in some way Not at all Not at all Not at all  PHQ-9 Total Score 10 5 14        Fall Risk  10/11/2021 09/21/2021 11/26/2019 08/14/2017 07/02/2015  Falls in the past year? 0 0 0 No -  Number falls in past yr: 0 0 - - -  Injury with Fall? 0 0 - - -  Risk for fall due to : - Orthopedic patient;Other (Comment) - - Impaired balance/gait  Risk for fall due to: Comment - Dizziness - - -  Follow up - Falls prevention discussed Falls evaluation completed - -    PHQ9 SCORE ONLY 10/11/2021 09/21/2021 07/22/2020  PHQ-9 Total Score 10 0 5    Adult vaccines due  Topic Date Due   TETANUS/TDAP  09/13/2026    Health Maintenance Due  Topic Date Due   Zoster Vaccines- Shingrix (2 of 2) 10/07/2018   COLONOSCOPY (Pts 45-38yrs Insurance coverage will need to be confirmed)  04/20/2020      History/P.E. limitations: none  Adult vaccines due  Topic Date Due   TETANUS/TDAP  09/13/2026   There are no preventive care reminders to display for this patient.  Health Maintenance Due  Topic Date  Due   Zoster Vaccines- Shingrix (2 of 2) 10/07/2018   COLONOSCOPY (Pts 45-21yrs Insurance coverage will need to be confirmed)  04/20/2020     Chief Complaint  Patient presents with   Abdominal Pain   Anxiety

## 2021-10-11 NOTE — Assessment & Plan Note (Signed)
Established problem Tolerating Crestor 10 mg daily Check Lipids  Plan continue current med

## 2021-10-11 NOTE — Assessment & Plan Note (Signed)
Established problem TSH recently checked by ophthalmology patient taking levothyroxine 62.5 mg daily  Awit TSH from other office to decide on keeping current dose.

## 2021-10-11 NOTE — Assessment & Plan Note (Signed)
ABDOMINAL PAIN  Location: cramping band across mid-abd  Onset: couple weeks  Radiation: none  Severity: severe at times Pattern: continuous Course: gradually decreasing   Better with: dicyclomine, defecation  No relation to eating   Symptoms Eructation: Increased Flatus: Increased Nausea/Vomiting: no  Diarrhea: yes, treats with Pepto-Bismal  Constipation: yes, alternating with diarrhea, treats with OTC stool softener and Ducolax if no response  Melena/BRBPR: no  Hematemesis: no  Anorexia: no  Dysuria: no  Hematuria: no Flank pain: no Wt loss: no  EtOH use: no  NSAIDs/ASA: no  LMP: postmenopausal Vaginal bleeding: no  Colonoscopy 10/2020 V. 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

## 2021-10-11 NOTE — Assessment & Plan Note (Signed)
History of Present Illness:   Associated Signs/Symptoms: Stresses (significant other, family, friends, work, finances): retired, married Associated symptoms (palpitation, choking, SHOB, sweats, numbness, doom, tremor): no Anxious, nervous or worried most days about several different things: no Content of worries: focused around relation with SIL and granchildren Felling more tired than usual or tire more easily: when not having slept well Trouble falling or staying asleep: trouble staying asleep  anxiety, Waking early with thoughts about conflict with daughter-in-law and being unable to visit her young grandchildren. Uses Ambien 5 mg nightly for sleep. Added melatonin which resulted in her sleep too much.  She has problem with dry eyes/mouth from autoimmune conditions.  She is weary of psychtopic medications that may worsen these conditions.   (Hypo) Manic Symptoms:   No Mood swings affecting the patient's ability to function, Pressured speech, Flight of ideas, Elevated or irritable mood and Psychomotor agitation Anxiety Symptoms:   insomnia and intrusive thoughts  Psychotic Symptoms:   No hallucinations or delusions  Past Psychiatric History:  Depressed.  has previously been in counseling.   Previous antidepressant med therapy: Celexa Prior antipsychotic med therapy:none  No alcohol or illicit drug use  A/  [place] DSM-5  - Due to physiologic effect of substance or general medical condition?: no - Occurring in the context of an abrupt surge of intense fear and discomfort that reaches a peak within minutes?: no - Recurrent unexpected clinically significant panic attacks?: no - Anxiety about being in places from which leaving might be difficulty ir enbarrassing in the event of having a Panic Attack or panic-like symptoms?: no - Anxiety or worry concerning separation from major attachment figures, without more generalized worries?: no - Anxiety about exposure to a feared  object (e.g. spiders) or situations (e.g., getting injections, flying)?: no - Anxiety about health and about having or acquiring a serious illness without more generalized worries?: no - Anxiety about discrading or parting with personal items resulting in accumulation of possessions cluttering living area?: no - Anxiety or worry about an imagined defect in appearance?: no - Anxiety associated with recurrent worries, thoughts, or ruminations?: yes - Anxiety associated with exposure to a severe traumatic stressor?: no  - Anxiety concern: Question of adjustment disorder with anxious mood   P/ Recommend counseling Referral to Dr Casimiro Needle as Mrs Nordin wants to know if there is a medication that can help with her anxiety that would not likely cause problems with dry eyes.mouth or GI symptoms.      - Anxiety associated with a Manic Episode, a hypomanic episode, or a Major Depressive Espisode?: no - Clinically significant anxiety not covered above that represents a psychological or biological dysfunction in the individual (Adjustment DO)?: yes - Anxiety associated with recurrent worries, thoughts, or ruminations?: yes

## 2021-10-12 ENCOUNTER — Telehealth: Payer: Self-pay

## 2021-10-12 ENCOUNTER — Other Ambulatory Visit (HOSPITAL_COMMUNITY): Payer: Self-pay

## 2021-10-12 LAB — BASIC METABOLIC PANEL
BUN/Creatinine Ratio: 19 (ref 12–28)
BUN: 16 mg/dL (ref 8–27)
CO2: 26 mmol/L (ref 20–29)
Calcium: 9.8 mg/dL (ref 8.7–10.3)
Chloride: 101 mmol/L (ref 96–106)
Creatinine, Ser: 0.84 mg/dL (ref 0.57–1.00)
Glucose: 79 mg/dL (ref 70–99)
Potassium: 4.4 mmol/L (ref 3.5–5.2)
Sodium: 140 mmol/L (ref 134–144)
eGFR: 77 mL/min/{1.73_m2} (ref >59)

## 2021-10-12 LAB — LIPID PANEL
Chol/HDL Ratio: 3.3 ratio (ref 0.0–4.4)
Cholesterol, Total: 173 mg/dL (ref 100–199)
HDL: 52 mg/dL (ref >39)
LDL Chol Calc (NIH): 104 mg/dL — ABNORMAL HIGH (ref 0–99)
Triglycerides: 92 mg/dL (ref 0–149)
VLDL Cholesterol Cal: 17 mg/dL (ref 5–40)

## 2021-10-12 NOTE — Telephone Encounter (Signed)
A Prior Authorization was initiated for this patients DICYCLOMINE 10MG  through CoverMyMeds.   Key: [id number]

## 2021-10-13 ENCOUNTER — Encounter: Payer: Self-pay | Admitting: Family Medicine

## 2021-10-13 ENCOUNTER — Telehealth: Payer: Self-pay

## 2021-10-13 NOTE — Telephone Encounter (Signed)
Patient calls nurse line regarding concerns for vaccine reaction to pneumonia vaccine. Patient reports that she has noticed some redness (approx the size of a 50 cent piece)  with soreness and warmth.   Denies streaking, pain radiating into the arm, or fever.   Advised patient of supportive measures. Patient will send mychart picture for reference.   Provided with red flags.   Talbot Grumbling, RN

## 2021-10-13 NOTE — Telephone Encounter (Signed)
Prior Auth for patients medication DICYCLOMINE approved by [employer] D from 10/12/21 to 10/12/22.  Key:  [id number]  CoverMyMeds notified patients pharmacy.

## 2021-10-14 ENCOUNTER — Encounter: Payer: Self-pay | Admitting: Family Medicine

## 2021-10-17 ENCOUNTER — Other Ambulatory Visit: Payer: Self-pay | Admitting: Family Medicine

## 2021-10-17 ENCOUNTER — Encounter: Payer: Self-pay | Admitting: Family Medicine

## 2021-10-17 DIAGNOSIS — H02883 Meibomian gland dysfunction of right eye, unspecified eyelid: Secondary | ICD-10-CM

## 2021-10-17 DIAGNOSIS — H35372 Puckering of macula, left eye: Secondary | ICD-10-CM

## 2021-10-17 DIAGNOSIS — H2513 Age-related nuclear cataract, bilateral: Secondary | ICD-10-CM

## 2021-10-17 DIAGNOSIS — H02886 Meibomian gland dysfunction of left eye, unspecified eyelid: Secondary | ICD-10-CM | POA: Insufficient documentation

## 2021-10-17 HISTORY — DX: Age-related nuclear cataract, bilateral: H25.13

## 2021-10-17 HISTORY — DX: Meibomian gland dysfunction of right eye, unspecified eyelid: H02.883

## 2021-10-17 HISTORY — DX: Puckering of macula, left eye: H35.372

## 2021-10-20 ENCOUNTER — Ambulatory Visit: Payer: Medicare Other

## 2021-10-25 ENCOUNTER — Ambulatory Visit: Payer: Medicare Other | Attending: Urology

## 2021-10-25 ENCOUNTER — Other Ambulatory Visit: Payer: Self-pay

## 2021-10-25 DIAGNOSIS — N318 Other neuromuscular dysfunction of bladder: Secondary | ICD-10-CM | POA: Insufficient documentation

## 2021-10-25 DIAGNOSIS — N8189 Other female genital prolapse: Secondary | ICD-10-CM | POA: Diagnosis not present

## 2021-10-25 DIAGNOSIS — M6281 Muscle weakness (generalized): Secondary | ICD-10-CM | POA: Insufficient documentation

## 2021-10-25 DIAGNOSIS — M62838 Other muscle spasm: Secondary | ICD-10-CM | POA: Diagnosis not present

## 2021-10-25 DIAGNOSIS — R278 Other lack of coordination: Secondary | ICD-10-CM | POA: Diagnosis not present

## 2021-10-25 NOTE — Therapy (Signed)
[facility] [place] [place] [place], [place], [address] Phone: [phone]   Fax:  [phone]  Physical Therapy Evaluation  Patient Details  Name: Molly Giles MRN: [id number] Date of Birth: 1956/02/12 Referring Provider (PT): Pia Mau MD   Encounter Date: 10/25/2021   PT End of Session - 10/25/21 1632     Visit Number 1    Date for PT Re-Evaluation 01/03/22    Authorization Type Medicare    PT Start Time 8182    PT Stop Time 9937    PT Time Calculation (min) 42 min    Activity Tolerance Patient tolerated treatment well    Behavior During Therapy [facility] for tasks assessed/performed             Past Medical History:  Diagnosis Date   Abdominal bloating 05/10/2021   Acute gastritis 11/19/2020   Allergic rhinoconjunctivitis 07/02/2015   Anal fissure 11/19/2020   Arthritis    Benign positional vertigo 04/2015   Responded well to [facility]   Bruxism (teeth grinding)    Chronic contact dermatitis 08/06/2018   Per allergist Dr Pablo Lawrence.    Chronic migraine 02/25/2016   Per Dr Jaynee Eagles review in notes from [facility] from September 2015. Showed total headache days last month 18. Severe headache days 7 days. Moderate headache days 5 days. Mild headache days last month sick days. Days without headache last month 10 days. Symptoms associated with photophobia, phonophobia, osmophobia, neck pain, dizziness, jaw pain, nasal congestion, vision disturbances, tingling and numbness, weakness and worsening with activity. Each headache attack last 3 hours depending on treatment in severity. Left side, the right side, easier side, the frontal area in the back of the head. Characterized as throbbing, pressure, tightness, squeezing, stabbing and burning    Chronic migraine without aura 05/15/2013    Dr Jaynee Eagles [facility]   Chronic tonsillitis 01/27/2019   DDD (degenerative disc disease),  cervical 11/18/2018   DDD (degenerative disc disease), lumbar    Depression    Disc displacement, lumbar    Dysrhythmia    seen by dr Wynonia Lawman- not a problem since she has been on Bystolic   Episodic cluster headache, not intractable 03/06/2017   Essential hypertension 05/15/2013   Family history of adverse reaction to anesthesia    Brother- N/V   Family history of premature CAD 05/15/2013   Fibromyalgia syndrome 07/01/2015   Management by Dr Chauncey Cruel. Devashwar (Rheum)    GERD (gastroesophageal reflux disease) 12/16/2014   H/O seasonal allergies    Hammer toe    Left great toe   Hashimoto's thyroiditis    Per patient, diagnosed by Dr. Modena Nunnery   Hearing loss of both ears 07/27/2015   mild to borderline moderate low frequency hearing loss improving to within normal limits bilaterally on audiology testing at [facility] in November 2016.     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   Hyperlipidemia 1998   Hypokalemia due to excessive gastrointestinal loss of potassium 07/28/2019   Hypothyroidism    Impairment of balance 02/2015   Consequent of postconcussive syndrome   Injury of triangular fibrocartilage complex of left wrist 02/25/2019  Dx 02/25/19 Iran Planas IV MD ([facility])   Insulin resistance 07/04/2017   Internal hemorrhoid 01/28/2021   Internal hemorrhoid seen on colonoscopy 10/2020 Warnell Bureau MD [facility])   Interstitial cystitis    Irritable bowel syndrome with diarrhea 04/03/2016   Left ventricular hypertrophy, mild 02/25/2016   ECHOcardiogram report 06/17/15 showing EF55-60%, mild LVH and G1DD    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[place]) 03/10/2018   [facility]    Lumbar facet joint pain    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   Mood disorder ([place])    Morbid obesity ([place]) 03/06/2017   Musculoskeletal neck pain 07/14/2015   Nocturnal hypoxemia 03/06/2017   Normal coronary arteries 05/14/2014   Obesity (BMI 30.0-34.9) 01/29/2017   Osteoarthritis of left hip 11/01/2015   MRI order by Dr Alvan Dame (ortho) 10/2015 showed significant arthritis of left hip joint with cystic changes in femoral head c/w osteoarthritis   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, lumbar region 10/30/2011   Other insomnia 11/09/2016   Overweight    Pain in joint of left shoulder 11/09/2016   Pain in joint, multiple sites 11/18/2018   Palpitations 05/15/2013   [facility] manages   Periodic limb movement sleep disorder 03/28/2017   Perirectal cyst 05/07/2016   Poison ivy dermatitis 03/24/2021   PONV (postoperative nausea and vomiting)    Positive ANA (antinuclear antibody) 11/18/2018   Post concussion syndrome 06/06/2015   Post concussive syndrome 07/14/2015   Ms Schutter's post-concussive syndrome manifesting in vertigo and headache, mood changes, poor balance, dizziness, and decreased concentration per Dr Jaynee Eagles at [facility].    Posterior vitreous detachment of right eye 2014   Rectal abscess 05/10/2021   S/P left THA, AA 07/25/2016   Shingles    Shortness of breath dyspnea    with exertion   Sicca syndrome ([place]) 11/18/2018   (+) ANA   Sleep walking and eating 03/06/2017   Snoring 03/06/2017   Spondylosis of lumbar region without myelopathy or radiculopathy 10/30/2011   TFC (triangular fibrocartilage complex) injury 02/25/2019   Thyroid nodule 08/11/2009   Findings: The thyroid gland is within normal limits in size.  The gland is diffusely inhomogeneous. A small solid nodule is noted in the lower pole  medially on the right of 7 x 6 x 8 mm. A small solid nodule is noted inferiorly on the left of 3 x 3 x 4 mm.  IMPRESSION:  The thyroid  gland is within normal limits in size with only small solid nodules present, the largest of only 8 mm in diameter on the right.     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    Trochanteric bursitis, right hip 04/26/2020   Tubular adenoma of colon 01/28/2021   Colonoscopy screening 4 mm tubular Adenoma polyp Wilford Corner, MD [facility])   Tubular adenoma of colon 01/28/2021   Colonoscopy screening 4 mm tubular Adenoma polyp Wilford Corner, MD [facility])   Vasomotor symptoms due to menopause 04/19/2017   Vitamin D deficiency 05/08/2017   Vulvitis 07/22/2020   Yeast vaginitis 07/11/2019    Past Surgical History:  Procedure Laterality Date   Bladder dilitation     x 3   BREAST BIOPSY Right 2011   Benign histology   CARDIOVASCULAR STRESS TEST  2000   Unremarkable per pt report   CARPOMETACARPAL JOINT ARTHROTOMY Right 2011   COLONOSCOPY     COLONOSCOPY WITH PROPOFOL N/A 04/21/2015  Procedure: COLONOSCOPY WITH PROPOFOL;  Surgeon: Carol Ada, MD;  Location: [facility];  Service: Endoscopy;  Laterality: N/A;   EPIDURAL BLOCK INJECTION Left 04/12/2016   Left Medial Nerve Block and Left L5 ramus block, Dr Suella Broad    EPIDURAL BLOCK INJECTION  03/21/2016   Left L3-4 medial branch block and Left L5 & dorsal ramus block    EPIDURAL BLOCK INJECTION N/A 10/25/2016   Suella Broad, MD. Lumbar medial branch block   EPIDURAL BLOCK INJECTION N/A 02/09/2017   Suella Broad, MD.  Bilateral L3/4 medial branch block, bilateral L5 dorsal ramus block   EPIDURAL BLOCK INJECTION N/A 07/04/2017   Suella Broad, MD   FECAL TRANSPLANT  04/21/2015   Procedure: FECAL TRANSPLANT;  Surgeon: Carol Ada, MD;  Location: [facility] ENDOSCOPY;  Service: Endoscopy;;   HIP ARTHROPLASTY Left    HIP ARTHROSCOPY Left 03/06/2018   Left hip arthroplasty, redo for loose hip arthroplasty. Procedure at [facility]  Left 11/2015   for OA by Dr Suella Broad   [facility] IMPLANT PLACEMENT  04/2021   OTHER SURGICAL HISTORY Left 2016   Left L3/L4 medial nerve block and Left L5 Dorsal Ramus block Dr Mickel Duhamel   TOTAL HIP ARTHROPLASTY Left 07/25/2016   Procedure: LEFT TOTAL HIP ARTHROPLASTY ANTERIOR APPROACH;  Surgeon: Paralee Cancel, MD;  Location: [facility];  Service: Orthopedics;  Laterality: Left;    There were no vitals filed for this visit.    Subjective Assessment - 10/25/21 1620     Subjective Pt has long standing history of interstitial cystitis and has had PT for the last year and a half for symptom management. She had stim unit (Medtronic) placed 05/03/2021, which was helpful initially then stopped. She had replacement on 10/05/21 and sutures removed a week ago. Since then, she feels like she has been having a flare in symptoms with urgency and frequency. She woke 4x last night where she is typically getting up 2x/night (not helped wit hthe stim). She states that the goal with stim unit is 50% better. She does continue to use wand, but it does not feel like it is helping.    Pertinent History Interstitial cystitis; 1 vaginal delivery with episiotomy;    Patient Stated Goals decrease frequency/urgency    Currently in Pain? Yes    Pain Score 4     Pain Location Pelvis    Pain Orientation Medial    Pain Descriptors / Indicators Pressure;Other (Comment)   urgency   Pain Type Chronic pain    Pain Onset More than a month ago    Pain Frequency Constant    Aggravating Factors  unsure    Pain Relieving Factors estradiol, nystatin    Multiple Pain Sites No                [facility] PT Assessment - 10/25/21 0001       Assessment   Medical Diagnosis N31.8 - other neuromuscular dysfunction of bladder; N81.89 - Other female genital prolapse    Referring Provider (PT) Pia Mau MD    Onset Date/Surgical Date 05/03/21    Next MD Visit none scheduled    Prior Therapy yes      Precautions   Precautions  None      Restrictions   Weight Bearing Restrictions No      Balance Screen   Has the patient fallen in the past 6 months No    Has the patient had a decrease in  activity level because of a fear of falling?  No    Is the patient reluctant to leave their home because of a fear of falling?  No      Home Ecologist residence    Living Arrangements Spouse/significant other      Prior Function   Level of Independence Independent      Cognition   Overall Cognitive Status Within Functional Limits for tasks assessed      Palpation   Palpation comment Significnat tenderness in lower abdomen; bladder restriction with palpable firmness to bladder                  No emotional/communication barriers or cognitive limitation. Patient is motivated to learn. Patient understands and agrees with treatment goals and plan. PT explains patient will be examined in standing, sitting, and lying down to see how their muscles and joints work. When they are ready, they will be asked to remove their underwear so PT can examine their perineum. The patient is also given the option of providing their own chaperone as one is not provided in our facility. The patient also has the right and is explained the right to defer or refuse any part of the evaluation or treatment including the internal exam. With the patient's consent, PT will use one gloved finger to gently assess the muscles of the pelvic floor, seeing how well it contracts and relaxes and if there is muscle symmetry. After, the patient will get dressed and PT and patient will discuss exam findings and plan of care. PT and patient discuss plan of care, schedule, attendance policy and HEP activities.       Objective measurements completed on examination: See above findings.     Pelvic Floor Special Questions - 10/25/21 0001     Prior Pelvic/Prostate Exam Yes    Are you Pregnant or attempting pregnancy? No    Prior  Pregnancies Yes    Number of Pregnancies 1    Number of C-Sections 0    Number of Vaginal Deliveries 1    Any difficulty with labor and deliveries No    Episiotomy Performed Yes    Currently Sexually Active No   significant pain at last attempt to point that he was unable to have insertion   History of sexually transmitted disease No    Urinary Leakage No    Urinary urgency Yes    Urinary frequency tries to wait every 3 hours, but often can't    Fecal incontinence No   IBS   Fluid intake lots    Caffeine beverages no    Falling out feeling (prolapse) No    Skin Integrity Intact    Scar Episiotomy    Perineal Body/Introitus  Normal    External Palpation WNL    Prolapse Anterior Wall    Pelvic Floor Internal Exam Pt identity confirmed and verbal consent for internal pelvic exam provided    Exam Type Vaginal    Sensation WNL    Palpation tenderness and restriction at posterior fourchette; increased sharpness and restriction bil peri-urethra and decreased urethral mobility; pain and restriction in Rt superficial pelvic floor; deep pelvic floor tension bil, Rt>Lt    Strength fair squeeze, definite lift    Strength # of reps 5    Strength # of seconds 5    Tone high              [facility] Adult PT Treatment/Exercise - 10/25/21 0001  Neuro Re-ed    Neuro Re-ed Details  Diaphragmatic breathing with multimodal cues to improve pelvic floor relaxation      Exercises   Exercises Lumbar      Lumbar Exercises: Supine   Other Supine Lumbar Exercises Happy baby 10 breaths      Lumbar Exercises: Sidelying   Other Sidelying Lumbar Exercises Open books 5x bil      Lumbar Exercises: Quadruped   [place]/Old Horse 20 reps    Other Quadruped Lumbar Exercises Child's pose 10 breaths      Manual Therapy   Manual Therapy Myofascial release;Internal Pelvic Floor    Myofascial Release bladder mobilization/urethral mobilization to decrease myofascial restriction    Internal Pelvic Floor  release to Rt superficial and bil deep pelvic floor muscles with static pressure and gentle movement                     PT Education - 10/25/21 1958     Education Details Pt education performed on continuing down trianing; incorporating partner into manual techniques at home to provide greater relief and to help return to pain free intercourse; initial HEP.    Person(s) Educated Patient    Methods Explanation;Demonstration;Tactile cues;Verbal cues;Handout    Comprehension Verbalized understanding              PT Short Term Goals - 10/25/21 2005       PT SHORT TERM GOAL #1   Title Pt will be independent with HEP.    Time 4    Period Weeks    Status New    Target Date 11/22/21      PT SHORT TERM GOAL #2   Title Pt will be able to correctly perform diaphragmatic breathing and appropriate pressure management in order to prevent worsening vaginal wall laxity and improve pelvic floor A/ROM.    Time 4    Period Weeks    Status New    Target Date 11/22/21      PT SHORT TERM GOAL #3   Title Pt will be independent and provide teach back of urge suppression technique and in bladder retraining techniques in order to help decrease urinary frequency/urgency.    Time 4    Period Weeks    Status New    Target Date 11/22/21               PT Long Term Goals - 10/25/21 2007       PT LONG TERM GOAL #1   Title Pt will be independent with advanced HEP.    Time 10    Period Weeks    Status New    Target Date 01/03/22      PT LONG TERM GOAL #2   Title Pt will demonstrate normal pelvic floor muscle tone and A/ROM, able to achieve 4/5 strength with contractions and 10 sec endurance, in order to provide appropriate lumbopelvic support in functional activities and decrease urinary symptoms.    Time 10    Period Weeks    Status New    Target Date 01/03/22      PT LONG TERM GOAL #3   Title Pt will report 0/10 pain with vaginal penetration in order to improve intimate  relationship with partner.    Time 10    Period Weeks    Status New    Target Date 01/03/22                    Plan -  10/25/21 1959     Clinical Impression Statement Pt is a 66 year old female with chief complaint of urinary urgency and frequency that started years ago with diagnosis of interstitial cystitis. Exam findings notable for bladder restriction and palpable pain, increased superficial and deep pelvic floor tension (Rt>Lt), anterior vaginal wall laxity, pelvic floor weakness 3/5, decreased pelvic floor endurance 5 sec, and urethral restriction Bil. Signs and symptoms are most consistent with increased pelvic floor muscular tension and bladder/urethral restriction associated with underlying diagnosis of interstitial cystitis. Initial treatment included manual techniques to help improve restriction/tension with good decrease in pain and sense of urgency; we reviewed initial HEP. She will benefit from skilled PT intervention in order to address impairments, improve pain, and decrease urinary frequency/urgency.    Personal Factors and Comorbidities Comorbidity 1    Comorbidities Interstitial cystitis    Examination-Participation Restrictions Community Activity;Interpersonal Relationship    Stability/Clinical Decision Making Stable/Uncomplicated    Clinical Decision Making Low    Rehab Potential Good    PT Frequency 1x / week    PT Duration Other (comment)   10 weeks   PT Treatment/Interventions ADLs/Self Care Home Management;Biofeedback;Cryotherapy;Electrical Stimulation;Moist Heat;Therapeutic activities;Therapeutic exercise;Neuromuscular re-education;Manual techniques;Patient/family education;Scar mobilization;Passive range of motion;Dry needling;Spinal Manipulations    PT Next Visit Plan Plan to continue manual techniques to reduce bladder/urethral restriction and decrease pelvic floor tension; pelvic floor A/ROM and bulge training.    PT Home Exercise Plan [email or web address]     Consulted and Agree with Plan of Care Patient             Patient will benefit from skilled therapeutic intervention in order to improve the following deficits and impairments:  Decreased coordination, Decreased range of motion, Increased fascial restricitons, Impaired tone, Decreased endurance, Increased muscle spasms, Pain, Decreased activity tolerance, Decreased scar mobility, Hypomobility, Decreased mobility, Decreased strength  Visit Diagnosis: Other muscle spasm  Muscle weakness (generalized)  Other lack of coordination     Problem List Patient Active Problem List   Diagnosis Date Noted   Meibomian gland dysfunction (MGD) of both eyes 10/17/2021   Nuclear sclerosis of both eyes 10/17/2021   Epiretinal membrane (ERM) of left eye 10/17/2021   Dysphagia 10/11/2021   OAB (overactive bladder) 03/02/2021   Anterior to posterior tear of superior glenoid labrum of left shoulder 02/22/2021   Osteoarthritis of acromioclavicular joint 02/22/2021   Hyperhidrosis, scalp, 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   Chronic low back pain 05/14/2019   Sjogren's syndrome ([place]) 01/27/2019   Cervical spondylosis 11/18/2018   DDD (degenerative disc disease), lumbar 11/18/2018   Keratoconjunctivitis sicca ([place]) 11/18/2018   Chronic contact dermatitis 08/06/2018   Primary osteoarthritis of left knee 06/20/2018   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[place]) 11/29/2017   Vitamin D deficiency 05/08/2017   Periodic limb movement sleep disorder 03/28/2017   Obesity (BMI 30.0-34.9) 01/29/2017   Other insomnia 11/09/2016   Abdominal discomfort, generalized 05/07/2016   Irritable bowel syndrome with diarrhea 04/03/2016   Left ventricular hypertrophy, mild 02/25/2016   Mood disorder ([place]) 07/02/2015   Allergic rhinoconjunctivitis 07/02/2015   Fibromyalgia syndrome 07/01/2015   Personal history of colonic polyps 07/01/2015   Hypothyroidism 05/15/2013   Pure hypercholesterolemia 05/15/2013   Interstitial cystitis 05/15/2013    Chronic migraine without aura 05/15/2013    Heather Roberts, PT, DPT02/14/238:09 PM   [facility] [place], [place], [address] Phone: [phone]   Fax:  [phone]  Name: Molly Giles MRN: [id number] Date of Birth:  09/01/1956 ° ° °

## 2021-10-25 NOTE — Patient Instructions (Signed)
Access Code: [email or web address] URL: [email or web address] Date: 10/25/2021 Prepared by: Heather Roberts  Exercises Supine Diaphragmatic Breathing - 1 x daily - 7 x weekly - 3 sets - 10 reps Happy Baby with Pelvic Floor Lengthening - 1 x daily - 7 x weekly - 3 sets - 10 reps Cat Cow - 1 x daily - 7 x weekly - 3 sets - 10 reps Child's Pose Stretch - 1 x daily - 7 x weekly - 3 sets - 10 reps Sidelying Open Book Thoracic Lumbar Rotation and Extension - 1 x daily - 7 x weekly - 3 sets - 10 reps

## 2021-11-03 ENCOUNTER — Ambulatory Visit: Payer: Medicare Other

## 2021-11-08 ENCOUNTER — Ambulatory Visit: Payer: Medicare Other | Admitting: Family Medicine

## 2021-11-08 ENCOUNTER — Other Ambulatory Visit: Payer: Self-pay | Admitting: Physician Assistant

## 2021-11-08 DIAGNOSIS — M797 Fibromyalgia: Secondary | ICD-10-CM

## 2021-11-08 NOTE — Telephone Encounter (Signed)
Next Visit: 12/20/2021   Last Visit: 06/21/2021   Last Fill:10/10/2021   Dx: Other insomnia   Current Dose per office note on 06/21/2021: Ambien 5 mg 1 tablet by mouth at bedtime for insomnia   Okay to refill Ambien?

## 2021-11-10 ENCOUNTER — Other Ambulatory Visit: Payer: Self-pay

## 2021-11-10 ENCOUNTER — Ambulatory Visit: Payer: Medicare Other | Attending: Urology

## 2021-11-10 DIAGNOSIS — M62838 Other muscle spasm: Secondary | ICD-10-CM | POA: Diagnosis not present

## 2021-11-10 DIAGNOSIS — M542 Cervicalgia: Secondary | ICD-10-CM | POA: Insufficient documentation

## 2021-11-10 DIAGNOSIS — H8112 Benign paroxysmal vertigo, left ear: Secondary | ICD-10-CM | POA: Insufficient documentation

## 2021-11-10 DIAGNOSIS — R2689 Other abnormalities of gait and mobility: Secondary | ICD-10-CM | POA: Diagnosis present

## 2021-11-10 DIAGNOSIS — R42 Dizziness and giddiness: Secondary | ICD-10-CM | POA: Insufficient documentation

## 2021-11-10 DIAGNOSIS — M6281 Muscle weakness (generalized): Secondary | ICD-10-CM | POA: Insufficient documentation

## 2021-11-10 DIAGNOSIS — R278 Other lack of coordination: Secondary | ICD-10-CM | POA: Diagnosis present

## 2021-11-10 NOTE — Therapy (Signed)
Molly Giles @ [place], [place], [address] Phone: [phone]   Fax:  [phone]  Physical Therapy Treatment  Patient Details  Name: Molly Giles MRN: [id number] Date of Birth: [birth year] Referring Provider (PT): Pia Mau MD   Encounter Date: 11/10/2021   PT End of Session - 11/10/21 1448     Visit Number 2    Date for PT Re-Evaluation 01/03/22    Authorization Type Medicare    Progress Note Due on Visit 10    PT Start Time 1447    PT Stop Time 1527    PT Time Calculation (min) 40 min    Activity Tolerance Patient tolerated treatment well    Behavior During Therapy [facility] for tasks assessed/performed             Past Medical History:  Diagnosis Date   Abdominal bloating 05/10/2021   Acute gastritis 11/19/2020   Allergic rhinoconjunctivitis 07/02/2015   Anal fissure 11/19/2020   Arthritis    Benign positional vertigo 04/2015   Responded well to [facility]   Bruxism (teeth grinding)    Chronic contact dermatitis 08/06/2018   Per allergist Dr Pablo Lawrence.    Chronic migraine 02/25/2016   Per Dr Jaynee Eagles review in notes from [facility] from September 2015. Showed total headache days last month 18. Severe headache days 7 days. Moderate headache days 5 days. Mild headache days last month sick days. Days without headache last month 10 days. Symptoms associated with photophobia, phonophobia, osmophobia, neck pain, dizziness, jaw pain, nasal congestion, vision disturbances, tingling and numbness, weakness and worsening with activity. Each headache attack last 3 hours depending on treatment in severity. Left side, the right side, easier side, the frontal area in the back of the head. Characterized as throbbing, pressure, tightness, squeezing, stabbing and burning    Chronic migraine without aura 05/15/2013    [facility]   Chronic tonsillitis 01/27/2019    DDD (degenerative disc disease), cervical 11/18/2018   DDD (degenerative disc disease), lumbar    Depression    Disc displacement, lumbar    Dysrhythmia    seen by dr Wynonia Lawman- not a problem since she has been on Bystolic   Episodic cluster headache, not intractable 03/06/2017   Essential hypertension 05/15/2013   Family history of adverse reaction to anesthesia    Brother- N/V   Family history of premature CAD 05/15/2013   Fibromyalgia syndrome 07/01/2015   Management by Dr Chauncey Cruel. Devashwar (Rheum)    GERD (gastroesophageal reflux disease) 12/16/2014   H/O seasonal allergies    Hammer toe    Left great toe   Hashimoto's thyroiditis    Per patient, diagnosed by Dr. Modena Nunnery   Hearing loss of both ears 07/27/2015   mild to borderline moderate low frequency hearing loss improving to within normal limits bilaterally on audiology testing at [facility] in November 2016.     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   Hyperlipidemia 1998   Hypokalemia due to excessive gastrointestinal loss of potassium 07/28/2019   Hypothyroidism    Impairment of balance 02/2015   Consequent of postconcussive syndrome   Injury  of triangular fibrocartilage complex of left wrist 02/25/2019   Dx 02/25/19 Iran Planas IV MD ([facility])   Insulin resistance 07/04/2017   Internal hemorrhoid 01/28/2021   Internal hemorrhoid seen on colonoscopy 10/2020 Warnell Bureau MD [facility])   Interstitial cystitis    Irritable bowel syndrome with diarrhea 04/03/2016   Left ventricular hypertrophy, mild 02/25/2016   ECHOcardiogram report 06/17/15 showing EF55-60%, mild LVH and G1DD    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[place])  03/10/2018   [facility]   Lumbar facet joint pain    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   Mood disorder ([place])    Morbid obesity ([place]) 03/06/2017   Musculoskeletal neck pain 07/14/2015   Nocturnal hypoxemia 03/06/2017   Normal coronary arteries 05/14/2014   Obesity (BMI 30.0-34.9) 01/29/2017   Osteoarthritis of left hip 11/01/2015   MRI order by Dr Alvan Dame (ortho) 10/2015 showed significant arthritis of left hip joint with cystic changes in femoral head c/w osteoarthritis   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, lumbar region 10/30/2011   Other insomnia 11/09/2016   Overweight    Pain in joint of left shoulder 11/09/2016   Pain in joint, multiple sites 11/18/2018   Palpitations 05/15/2013   [facility] manages   Periodic limb movement sleep disorder 03/28/2017   Perirectal cyst 05/07/2016   Poison ivy dermatitis 03/24/2021   PONV (postoperative nausea and vomiting)    Positive ANA (antinuclear antibody) 11/18/2018   Post concussion syndrome 06/06/2015   Post concussive syndrome 07/14/2015   Ms Rabenold's post-concussive syndrome manifesting in vertigo and headache, mood changes, poor balance, dizziness, and decreased concentration per Dr Jaynee Eagles at [facility].    Posterior vitreous detachment of right eye 2014   Rectal abscess 05/10/2021   S/P left THA, AA 07/25/2016   Shingles    Shortness of breath dyspnea    with exertion   Sicca syndrome ([place]) 11/18/2018   (+) ANA   Sleep walking and eating 03/06/2017   Snoring 03/06/2017   Spondylosis of lumbar region without myelopathy or radiculopathy 10/30/2011   TFC (triangular fibrocartilage complex) injury 02/25/2019   Thyroid nodule 08/11/2009   Findings: The thyroid gland is within normal limits in size.  The gland is diffusely inhomogeneous. A small solid nodule is noted in the lower pole  medially on the right of 7 x 6 x 8 mm. A small solid nodule is noted inferiorly on the left of 3 x 3 x 4  mm.  IMPRESSION:  The thyroid gland is within normal limits in size with only small solid nodules present, the largest of only 8 mm in diameter on the right.     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    Trochanteric bursitis, right hip 04/26/2020   Tubular adenoma of colon 01/28/2021   Colonoscopy screening 4 mm tubular Adenoma polyp Wilford Corner, MD [facility])   Tubular adenoma of colon 01/28/2021   Colonoscopy screening 4 mm tubular Adenoma polyp Wilford Corner, MD [facility])   Vasomotor symptoms due to menopause 04/19/2017   Vitamin D deficiency 05/08/2017   Vulvitis 07/22/2020   Yeast vaginitis 07/11/2019    Past Surgical History:  Procedure Laterality Date   Bladder dilitation     x 3   BREAST BIOPSY Right 2011   Benign histology   CARDIOVASCULAR STRESS TEST  2000   Unremarkable per pt report   CARPOMETACARPAL JOINT ARTHROTOMY Right 2011   COLONOSCOPY  COLONOSCOPY WITH PROPOFOL N/A 04/21/2015   Procedure: COLONOSCOPY WITH PROPOFOL;  Surgeon: Carol Ada, MD;  Location: [facility];  Service: Endoscopy;  Laterality: N/A;   EPIDURAL BLOCK INJECTION Left 04/12/2016   Left Medial Nerve Block and Left L5 ramus block, Dr Suella Broad    EPIDURAL BLOCK INJECTION  03/21/2016   Left L3-4 medial branch block and Left L5 & dorsal ramus block    EPIDURAL BLOCK INJECTION N/A 10/25/2016   Suella Broad, MD. Lumbar medial branch block   EPIDURAL BLOCK INJECTION N/A 02/09/2017   Suella Broad, MD.  Bilateral L3/4 medial branch block, bilateral L5 dorsal ramus block   EPIDURAL BLOCK INJECTION N/A 07/04/2017   Suella Broad, MD   FECAL TRANSPLANT  04/21/2015   Procedure: FECAL TRANSPLANT;  Surgeon: Carol Ada, MD;  Location[facility] ENDOSCOPY;  Service: Endoscopy;;   HIP ARTHROPLASTY Left    HIP ARTHROSCOPY Left 03/06/2018   Left hip arthroplasty, redo for loose hip arthroplasty. Procedure at [place] Left 11/2015   for OA by Dr Suella Broad   [facility] IMPLANT PLACEMENT  04/2021   OTHER SURGICAL HISTORY Left 2016   Left L3/L4 medial nerve block and Left L5 Dorsal Ramus block Dr Mickel Duhamel   TOTAL HIP ARTHROPLASTY Left 07/25/2016   Procedure: LEFT TOTAL HIP ARTHROPLASTY ANTERIOR APPROACH;  Surgeon: Paralee Cancel, MD;  Location: [facility];  Service: Orthopedics;  Laterality: Left;    There were no vitals filed for this visit.   Subjective Assessment - 11/10/21 1448     Subjective Pt states that she is feeling much better. When she came last time, she was haivng a lot of symptoms and they are largely improved. She will still have somesevere urgency, but better.    Patient Stated Goals decrease frequency/urgency    Currently in Pain? No/denies    Multiple Pain Sites No                               [facility] Adult PT Treatment/Exercise - 11/10/21 0001       Self-Care   Self-Care Other Self-Care Comments   elimination diet, journaling, chornic pain counseling     Neuro Re-ed    Neuro Re-ed Details  Diaphragmatic breathing with multimodal cues to improve pelvic floor relaxation      Manual Therapy   Manual Therapy Myofascial release;Internal Pelvic Floor    Myofascial Release bladder mobilization/urethral mobilization to decrease myofascial restriction    Internal Pelvic Floor release to Rt superficial and bil deep pelvic floor muscles with static pressure and gentle movement                     PT Education - 11/10/21 1452     Education Details Pt education perofrmed on chronic pain counseling, journaling, and elimination diet.    Person(s) Educated Patient    Methods Explanation;Demonstration;Tactile cues;Verbal cues    Comprehension Verbalized understanding              PT Short Term Goals - 10/25/21 2005       PT SHORT TERM GOAL #1   Title Pt will be independent with HEP.    Time 4    Period Weeks    Status  New    Target Date 11/22/21      PT SHORT TERM GOAL #2   Title Pt will be able to correctly perform diaphragmatic  breathing and appropriate pressure management in order to prevent worsening vaginal wall laxity and improve pelvic floor A/ROM.    Time 4    Period Weeks    Status New    Target Date 11/22/21      PT SHORT TERM GOAL #3   Title Pt will be independent and provide teach back of urge suppression technique and in bladder retraining techniques in order to help decrease urinary frequency/urgency.    Time 4    Period Weeks    Status New    Target Date 11/22/21               PT Long Term Goals - 10/25/21 2007       PT LONG TERM GOAL #1   Title Pt will be independent with advanced HEP.    Time 10    Period Weeks    Status New    Target Date 01/03/22      PT LONG TERM GOAL #2   Title Pt will demonstrate normal pelvic floor muscle tone and A/ROM, able to achieve 4/5 strength with contractions and 10 sec endurance, in order to provide appropriate lumbopelvic support in functional activities and decrease urinary symptoms.    Time 10    Period Weeks    Status New    Target Date 01/03/22      PT LONG TERM GOAL #3   Title Pt will report 0/10 pain with vaginal penetration in order to improve intimate relationship with partner.    Time 10    Period Weeks    Status New    Target Date 01/03/22                   Plan - 11/10/21 1453     Clinical Impression Statement Pt overall doing much better after last visit and return to many exercises to help improve abdominal/pelvic mobility. She did demonstrate notable bil urethral restriction today and abdominal restriction. Good tolerance to all manual techniques, with minor residual burning at end of session internally. She will benefit from skilled PT intervention in order to address impairments, improve pain, and decrease urinary frequency/urgency.    PT Treatment/Interventions ADLs/Self Care Home  Management;Biofeedback;Cryotherapy;Electrical Stimulation;Moist Heat;Therapeutic activities;Therapeutic exercise;Neuromuscular re-education;Manual techniques;Patient/family education;Scar mobilization;Passive range of motion;Dry needling;Spinal Manipulations    PT Next Visit Plan Plan to continue manual techniques to reduce bladder/urethral restriction and decrease pelvic floor tension; pelvic floor A/ROM and bulge training.    PT Home Exercise Plan [email or web address]    Consulted and Agree with Plan of Care Patient             Patient will benefit from skilled therapeutic intervention in order to improve the following deficits and impairments:  Decreased coordination, Decreased range of motion, Increased fascial restricitons, Impaired tone, Decreased endurance, Increased muscle spasms, Pain, Decreased activity tolerance, Decreased scar mobility, Hypomobility, Decreased mobility, Decreased strength  Visit Diagnosis: Other muscle spasm  Muscle weakness (generalized)  Other lack of coordination     Problem List Patient Active Problem List   Diagnosis Date Noted   Meibomian gland dysfunction (MGD) of both eyes 10/17/2021   Nuclear sclerosis of both eyes 10/17/2021   Epiretinal membrane (ERM) of left eye 10/17/2021   Dysphagia 10/11/2021   OAB (overactive bladder) 03/02/2021   Anterior to posterior tear of superior glenoid labrum of left shoulder 02/22/2021   Osteoarthritis of acromioclavicular joint 02/22/2021   Hyperhidrosis, scalp, 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   Chronic low back pain 05/14/2019   Sjogren's  syndrome ([place]) 01/27/2019   Cervical spondylosis 11/18/2018   DDD (degenerative disc disease), lumbar 11/18/2018   Keratoconjunctivitis sicca ([place]) 11/18/2018   Chronic contact dermatitis 08/06/2018   Primary osteoarthritis of left knee 06/20/2018   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[place]) 11/29/2017   Vitamin D deficiency 05/08/2017   Periodic limb movement sleep disorder 03/28/2017   Obesity  (BMI 30.0-34.9) 01/29/2017   Other insomnia 11/09/2016   Abdominal discomfort, generalized 05/07/2016   Irritable bowel syndrome with diarrhea 04/03/2016   Left ventricular hypertrophy, mild 02/25/2016   Mood disorder ([place]) 07/02/2015   Allergic rhinoconjunctivitis 07/02/2015   Fibromyalgia syndrome 07/01/2015   Personal history of colonic polyps 07/01/2015   Hypothyroidism 05/15/2013   Pure hypercholesterolemia 05/15/2013   Interstitial cystitis 05/15/2013   Chronic migraine without aura 05/15/2013    Heather Roberts, PT, DPT03/02/233:28 PM   [facility] @ [place], [place], [address] Phone: [phone]   Fax:  [phone]  Name: Quincy Prisco MRN: [id number] Date of Birth: Mar 15, 1956

## 2021-11-14 ENCOUNTER — Ambulatory Visit: Payer: Medicare Other

## 2021-11-14 ENCOUNTER — Other Ambulatory Visit: Payer: Self-pay

## 2021-11-14 DIAGNOSIS — M6281 Muscle weakness (generalized): Secondary | ICD-10-CM

## 2021-11-14 DIAGNOSIS — M62838 Other muscle spasm: Secondary | ICD-10-CM

## 2021-11-14 DIAGNOSIS — R278 Other lack of coordination: Secondary | ICD-10-CM

## 2021-11-14 NOTE — Therapy (Signed)
[facility] @ [address] [place] [place], [place], [address] Phone: [phone]   Fax:  [phone]  Physical Therapy Treatment  Patient Details  Name: Molly Giles MRN: [id number] Date of Birth: 28-May-1956 Referring Provider (PT): Pia Mau MD   Encounter Date: 11/14/2021   PT End of Session - 11/14/21 0932     Visit Number 3    Date for PT Re-Evaluation 01/03/22    Authorization Type Medicare    Progress Note Due on Visit 10    PT Start Time 0931    PT Stop Time 1014    PT Time Calculation (min) 43 min    Activity Tolerance Patient tolerated treatment well    Behavior During Therapy [facility] for tasks assessed/performed             Past Medical History:  Diagnosis Date   Abdominal bloating 05/10/2021   Acute gastritis 11/19/2020   Allergic rhinoconjunctivitis 07/02/2015   Anal fissure 11/19/2020   Arthritis    Benign positional vertigo 04/2015   Responded well to Vestibular Rehab   Bruxism (teeth grinding)    Chronic contact dermatitis 08/06/2018   Per allergist Dr Pablo Lawrence.    Chronic migraine 02/25/2016   Per Dr Jaynee Eagles review in notes from [facility] from September 2015. Showed total headache days last month 18. Severe headache days 7 days. Moderate headache days 5 days. Mild headache days last month sick days. Days without headache last month 10 days. Symptoms associated with photophobia, phonophobia, osmophobia, neck pain, dizziness, jaw pain, nasal congestion, vision disturbances, tingling and numbness, weakness and worsening with activity. Each headache attack last 3 hours depending on treatment in severity. Left side, the right side, easier side, the frontal area in the back of the head. Characterized as throbbing, pressure, tightness, squeezing, stabbing and burning    Chronic migraine without aura 05/15/2013    Dr Jaynee Eagles [facility]   Chronic tonsillitis 01/27/2019    DDD (degenerative disc disease), cervical 11/18/2018   DDD (degenerative disc disease), lumbar    Depression    Disc displacement, lumbar    Dysrhythmia    seen by dr Wynonia Lawman- not a problem since she has been on Bystolic   Episodic cluster headache, not intractable 03/06/2017   Essential hypertension 05/15/2013   Family history of adverse reaction to anesthesia    Brother- N/V   Family history of premature CAD 05/15/2013   Fibromyalgia syndrome 07/01/2015   Management by Dr Chauncey Cruel. Devashwar (Rheum)    GERD (gastroesophageal reflux disease) 12/16/2014   H/O seasonal allergies    Hammer toe    Left great toe   Hashimoto's thyroiditis    Per patient, diagnosed by Dr. Modena Nunnery   Hearing loss of both ears 07/27/2015   mild to borderline moderate low frequency hearing loss improving to within normal limits bilaterally on audiology testing at [facility] in November 2016.     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   Hyperlipidemia 1998   Hypokalemia due to excessive gastrointestinal loss of potassium 07/28/2019   Hypothyroidism    Impairment of balance 02/2015   Consequent of postconcussive syndrome   Injury  of triangular fibrocartilage complex of left wrist 02/25/2019   Dx 02/25/19 Iran Planas IV MD ([facility])   Insulin resistance 07/04/2017   Internal hemorrhoid 01/28/2021   Internal hemorrhoid seen on colonoscopy 10/2020 Warnell Bureau MD [facility])   Interstitial cystitis    Irritable bowel syndrome with diarrhea 04/03/2016   Left ventricular hypertrophy, mild 02/25/2016   ECHOcardiogram report 06/17/15 showing EF55-60%, mild LVH and G1DD    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[place])  03/10/2018   [facility]   Lumbar facet joint pain    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   Mood disorder ([place])    Morbid obesity ([place]) 03/06/2017   Musculoskeletal neck pain 07/14/2015   Nocturnal hypoxemia 03/06/2017   Normal coronary arteries 05/14/2014   Obesity (BMI 30.0-34.9) 01/29/2017   Osteoarthritis of left hip 11/01/2015   MRI order by Dr Alvan Dame (ortho) 10/2015 showed significant arthritis of left hip joint with cystic changes in femoral head c/w osteoarthritis   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, lumbar region 10/30/2011   Other insomnia 11/09/2016   Overweight    Pain in joint of left shoulder 11/09/2016   Pain in joint, multiple sites 11/18/2018   Palpitations 05/15/2013   [facility] manages   Periodic limb movement sleep disorder 03/28/2017   Perirectal cyst 05/07/2016   Poison ivy dermatitis 03/24/2021   PONV (postoperative nausea and vomiting)    Positive ANA (antinuclear antibody) 11/18/2018   Post concussion syndrome 06/06/2015   Post concussive syndrome 07/14/2015   Ms Rabenold's post-concussive syndrome manifesting in vertigo and headache, mood changes, poor balance, dizziness, and decreased concentration per Dr Jaynee Eagles at [facility].    Posterior vitreous detachment of right eye 2014   Rectal abscess 05/10/2021   S/P left THA, AA 07/25/2016   Shingles    Shortness of breath dyspnea    with exertion   Sicca syndrome ([place]) 11/18/2018   (+) ANA   Sleep walking and eating 03/06/2017   Snoring 03/06/2017   Spondylosis of lumbar region without myelopathy or radiculopathy 10/30/2011   TFC (triangular fibrocartilage complex) injury 02/25/2019   Thyroid nodule 08/11/2009   Findings: The thyroid gland is within normal limits in size.  The gland is diffusely inhomogeneous. A small solid nodule is noted in the lower pole  medially on the right of 7 x 6 x 8 mm. A small solid nodule is noted inferiorly on the left of 3 x 3 x 4  mm.  IMPRESSION:  The thyroid gland is within normal limits in size with only small solid nodules present, the largest of only 8 mm in diameter on the right.     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    Trochanteric bursitis, right hip 04/26/2020   Tubular adenoma of colon 01/28/2021   Colonoscopy screening 4 mm tubular Adenoma polyp Wilford Corner, MD [facility])   Tubular adenoma of colon 01/28/2021   Colonoscopy screening 4 mm tubular Adenoma polyp Wilford Corner, MD [facility])   Vasomotor symptoms due to menopause 04/19/2017   Vitamin D deficiency 05/08/2017   Vulvitis 07/22/2020   Yeast vaginitis 07/11/2019    Past Surgical History:  Procedure Laterality Date   Bladder dilitation     x 3   BREAST BIOPSY Right 2011   Benign histology   CARDIOVASCULAR STRESS TEST  2000   Unremarkable per pt report   CARPOMETACARPAL JOINT ARTHROTOMY Right 2011   COLONOSCOPY  COLONOSCOPY WITH PROPOFOL N/A 04/21/2015   Procedure: COLONOSCOPY WITH PROPOFOL;  Surgeon: Carol Ada, MD;  Location: [place];  Service: Endoscopy;  Laterality: N/A;   EPIDURAL BLOCK INJECTION Left 04/12/2016   Left Medial Nerve Block and Left L5 ramus block, Dr Suella Broad    EPIDURAL BLOCK INJECTION  03/21/2016   Left L3-4 medial branch block and Left L5 & dorsal ramus block    EPIDURAL BLOCK INJECTION N/A 10/25/2016   Suella Broad, MD. Lumbar medial branch block   EPIDURAL BLOCK INJECTION N/A 02/09/2017   Suella Broad, MD.  Bilateral L3/4 medial branch block, bilateral L5 dorsal ramus block   EPIDURAL BLOCK INJECTION N/A 07/04/2017   Suella Broad, MD   FECAL TRANSPLANT  04/21/2015   Procedure: FECAL TRANSPLANT;  Surgeon: Carol Ada, MD;  Location: [facility];  Service: Endoscopy;;   HIP ARTHROPLASTY Left    HIP ARTHROSCOPY Left 03/06/2018   Left hip arthroplasty, redo for loose hip arthroplasty. Procedure at [facility] Left 11/2015   for OA by Dr Suella Broad   [facility] IMPLANT PLACEMENT  04/2021   OTHER SURGICAL HISTORY Left 2016   Left L3/L4 medial nerve block and Left L5 Dorsal Ramus block Dr Mickel Duhamel   TOTAL HIP ARTHROPLASTY Left 07/25/2016   Procedure: LEFT TOTAL HIP ARTHROPLASTY ANTERIOR APPROACH;  Surgeon: Paralee Cancel, MD;  Location: [facility];  Service: Orthopedics;  Laterality: Left;    There were no vitals filed for this visit.   Subjective Assessment - 11/14/21 0932     Subjective Pt states that she has had some burning since her last session. She has started working on cutting out almonds in her diet, but has also realized that she takes in a lot of nuts throughout the day and it will be very difficult to leave out. She is wondering if a vinegarette may have s tarted the burning back up.    Patient Stated Goals decrease frequency/urgency    Currently in Pain? No/denies    Multiple Pain Sites No                  No emotional/communication barriers or cognitive limitation. Patient is motivated to learn. Patient understands and agrees with treatment goals and plan. PT explains patient will be examined in standing, sitting, and lying down to see how their muscles and joints work. When they are ready, they will be asked to remove their underwear so PT can examine their perineum. The patient is also given the option of providing their own chaperone as one is not provided in our facility. The patient also has the right and is explained the right to defer or refuse any part of the evaluation or treatment including the internal exam. With the patient's consent, PT will use one gloved finger to gently assess the muscles of the pelvic floor, seeing how well it contracts and relaxes and if there is muscle symmetry. After, the patient will get dressed and PT and patient will discuss exam findings and plan of care. PT and patient discuss plan of care, schedule, attendance  policy and HEP activities.              [place] Adult PT Treatment/Exercise - 11/14/21 0001       Neuro Re-ed    Neuro Re-ed Details  Diaphragmatic breathing with multimodal cues to improve pelvic floor relaxation; pelvic floor muscle A/ROM and bulge training      Lumbar Exercises: Stretches  Piriformis Stretch Right;Left;1 rep;60 seconds      Lumbar Exercises: Supine   Pelvic Tilt 20 reps   breath coordination   Other Supine Lumbar Exercises Bent knee fall out 10x bil      Manual Therapy   Manual Therapy Myofascial release;Internal Pelvic Floor    Myofascial Release bladder mobilization/urethral mobilization to decrease myofascial restriction    Internal Pelvic Floor release to Rt superficial and bil deep pelvic floor muscles with static pressure and gentle movement                     PT Education - 11/14/21 0944     Education Details Further education perofrmed on chronic pain counseling.    Person(s) Educated Patient    Methods Explanation;Demonstration;Tactile cues;Verbal cues;Handout    Comprehension Verbalized understanding              PT Short Term Goals - 10/25/21 2005       PT SHORT TERM GOAL #1   Title Pt will be independent with HEP.    Time 4    Period Weeks    Status New    Target Date 11/22/21      PT SHORT TERM GOAL #2   Title Pt will be able to correctly perform diaphragmatic breathing and appropriate pressure management in order to prevent worsening vaginal wall laxity and improve pelvic floor A/ROM.    Time 4    Period Weeks    Status New    Target Date 11/22/21      PT SHORT TERM GOAL #3   Title Pt will be independent and provide teach back of urge suppression technique and in bladder retraining techniques in order to help decrease urinary frequency/urgency.    Time 4    Period Weeks    Status New    Target Date 11/22/21               PT Long Term Goals - 10/25/21 2007       PT LONG TERM GOAL #1   Title Pt  will be independent with advanced HEP.    Time 10    Period Weeks    Status New    Target Date 01/03/22      PT LONG TERM GOAL #2   Title Pt will demonstrate normal pelvic floor muscle tone and A/ROM, able to achieve 4/5 strength with contractions and 10 sec endurance, in order to provide appropriate lumbopelvic support in functional activities and decrease urinary symptoms.    Time 10    Period Weeks    Status New    Target Date 01/03/22      PT LONG TERM GOAL #3   Title Pt will report 0/10 pain with vaginal penetration in order to improve intimate relationship with partner.    Time 10    Period Weeks    Status New    Target Date 01/03/22                   Plan - 11/14/21 0944     Clinical Impression Statement Pt had more significant peri-urethral restriction and sharp tenderness today; good tolerance to bladder/urethral traction in reducing symptoms. Since patient primarily hydrates with propel, we discussed examining what type of articial sweeteners are in this and possibly try eliminating. Good tolerance to progression of exercise to include pelvic tilts and bent knee fall outs with no increase in pain. She will benefit from skilled PT intervention in order  to address impairments, improve pain, and decrease urinary frequency/urgency.    PT Treatment/Interventions ADLs/Self Care Home Management;Biofeedback;Cryotherapy;Electrical Stimulation;Moist Heat;Therapeutic activities;Therapeutic exercise;Neuromuscular re-education;Manual techniques;Patient/family education;Scar mobilization;Passive range of motion;Dry needling;Spinal Manipulations    PT Next Visit Plan Plan to continue manual techniques to reduce bladder/urethral restriction and decrease pelvic floor tension; pelvic floor A/ROM and bulge training.    PT Home Exercise Plan [email or web address]    Consulted and Agree with Plan of Care Patient             Patient will benefit from skilled therapeutic intervention in order to  improve the following deficits and impairments:  Decreased coordination, Decreased range of motion, Increased fascial restricitons, Impaired tone, Decreased endurance, Increased muscle spasms, Pain, Decreased activity tolerance, Decreased scar mobility, Hypomobility, Decreased mobility, Decreased strength  Visit Diagnosis: Other muscle spasm  Muscle weakness (generalized)  Other lack of coordination     Problem List Patient Active Problem List   Diagnosis Date Noted   Meibomian gland dysfunction (MGD) of both eyes 10/17/2021   Nuclear sclerosis of both eyes 10/17/2021   Epiretinal membrane (ERM) of left eye 10/17/2021   Dysphagia 10/11/2021   OAB (overactive bladder) 03/02/2021   Anterior to posterior tear of superior glenoid labrum of left shoulder 02/22/2021   Osteoarthritis of acromioclavicular joint 02/22/2021   Hyperhidrosis, scalp, 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) 11/18/2018   Chronic contact dermatitis 08/06/2018   Primary osteoarthritis of left knee 06/20/2018   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[place]) 11/29/2017   Vitamin D deficiency 05/08/2017   Periodic limb movement sleep disorder 03/28/2017   Obesity (BMI 30.0-34.9) 01/29/2017   Other insomnia 11/09/2016   Abdominal discomfort, generalized 05/07/2016   Irritable bowel syndrome with diarrhea 04/03/2016   Left ventricular hypertrophy, mild 02/25/2016   Mood disorder ([place]) 07/02/2015   Allergic rhinoconjunctivitis 07/02/2015   Fibromyalgia syndrome 07/01/2015   Personal history of colonic polyps 07/01/2015   Hypothyroidism 05/15/2013   Pure hypercholesterolemia 05/15/2013   Interstitial cystitis 05/15/2013   Chronic migraine without aura 05/15/2013    Heather Roberts, PT, DPT03/02/2309:14 AM   [facility] @ [address] [place], [place], [address] Phone: [phone]   Fax:  [phone]  Name: Molly Giles MRN: [id number] Date of Birth: [birth year]

## 2021-11-16 ENCOUNTER — Other Ambulatory Visit: Payer: Self-pay | Admitting: Student

## 2021-11-16 DIAGNOSIS — M5412 Radiculopathy, cervical region: Secondary | ICD-10-CM

## 2021-11-22 ENCOUNTER — Other Ambulatory Visit: Payer: Self-pay | Admitting: Family Medicine

## 2021-11-22 DIAGNOSIS — K219 Gastro-esophageal reflux disease without esophagitis: Secondary | ICD-10-CM

## 2021-11-22 NOTE — Progress Notes (Signed)
Office visit note [name] [place], [place]) ?Visit for Odynophagia ?Plan EGD 12/08/21 Dr Michail Sermon ? ?Visit for GERD ?Start Famotidine 40 mg twice a day x 30 days, #60, RF 3 ?

## 2021-11-24 ENCOUNTER — Other Ambulatory Visit: Payer: Self-pay

## 2021-11-24 ENCOUNTER — Ambulatory Visit: Payer: Medicare Other | Admitting: Rehabilitative and Restorative Service Providers"

## 2021-11-24 ENCOUNTER — Ambulatory Visit: Payer: Medicare Other

## 2021-11-24 DIAGNOSIS — M62838 Other muscle spasm: Secondary | ICD-10-CM | POA: Diagnosis not present

## 2021-11-24 NOTE — Therapy (Signed)
[facility] ?[place] @ [address] ?[place], [place], [address] ?Phone: [phone]   Fax:  [phone] ? ?Physical Therapy Treatment ? ?Patient Details  ?Name: Molly Giles ?MRN: [id number] ?Date of Birth: December 28, 1955 ?Referring Provider (PT): Pia Mau MD ? ? ?Encounter Date: 11/24/2021 ? ? PT End of Session - 11/24/21 1456   ? ? Visit Number 4   ? Date for PT Re-Evaluation 01/03/22   ? Authorization Type Medicare   ? PT Start Time 9892   ? PT Stop Time 1533   ? PT Time Calculation (min) 40 min   ? Activity Tolerance Patient tolerated treatment well   ? Behavior During Therapy [facility] for tasks assessed/performed   ? ?  ?  ? ?  ? ? ?Past Medical History:  ?Diagnosis Date  ? Abdominal bloating 05/10/2021  ? Acute gastritis 11/19/2020  ? Allergic rhinoconjunctivitis 07/02/2015  ? Anal fissure 11/19/2020  ? Arthritis   ? Benign positional vertigo 04/2015  ? Responded well to Vestibular Rehab  ? Bruxism (teeth grinding)   ? Chronic contact dermatitis 08/06/2018  ? Per allergist Dr Pablo Lawrence.   ? Chronic migraine 02/25/2016  ? Per Dr Jaynee Eagles review in notes from [facility] from September 2015. Showed total headache days last month 18. Severe headache days 7 days. Moderate headache days 5 days. Mild headache days last month sick days. Days without headache last month 10 days. Symptoms associated with photophobia, phonophobia, osmophobia, neck pain, dizziness, jaw pain, nasal congestion, vision disturbances, tingling and numbness, weakness and worsening with activity. Each headache attack last 3 hours depending on treatment in severity. Left side, the right side, easier side, the frontal area in the back of the head. Characterized as throbbing, pressure, tightness, squeezing, stabbing and burning   ? Chronic migraine without aura 05/15/2013  ?  [facility]  ? Chronic tonsillitis 01/27/2019  ? DDD (degenerative disc disease),  cervical 11/18/2018  ? DDD (degenerative disc disease), lumbar   ? Depression   ? Disc displacement, lumbar   ? Dysrhythmia   ? seen by dr Wynonia Lawman- not a problem since she has been on Bystolic  ? Episodic cluster headache, not intractable 03/06/2017  ? Essential hypertension 05/15/2013  ? Family history of adverse reaction to anesthesia   ? Brother- N/V  ? Family history of premature CAD 05/15/2013  ? Fibromyalgia syndrome 07/01/2015  ? Management by Dr Chauncey Cruel. Devashwar ([facility])   ? GERD (gastroesophageal reflux disease) 12/16/2014  ? H/O seasonal allergies   ? Hammer toe   ? Left great toe  ? Hashimoto's thyroiditis   ? Per patient, diagnosed by Dr. Modena Nunnery  ? Hearing loss of both ears 07/27/2015  ? mild to borderline moderate low frequency hearing loss improving to within normal limits bilaterally on audiology testing at [facility] in November 2016.    ? History of Clostridium difficile colitis 07/01/2015  ? Required Fecal Transplantation tocure  ? History of colonic polyps   ? History of left hip replacement 09/20/2017  ? History of revision of total hip arthroplasty 04/10/2018  ? Hx of bad fall 02/2015  ? Severe Facial/head trauma without fracture  ? 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 Hyperlipidemia 1998  ? Hypokalemia due to excessive gastrointestinal loss of potassium 07/28/2019  ? Hypothyroidism   ? Impairment of balance 02/2015  ? Consequent of postconcussive syndrome  ? Injury of triangular fibrocartilage complex of left wrist 02/25/2019  ?  Dx 02/25/19 Iran Planas IV MD [facility])  ? Insulin resistance 07/04/2017  ? Internal hemorrhoid 01/28/2021  ? Internal hemorrhoid seen on colonoscopy 10/2020 Warnell Bureau MD [facility])  ? Interstitial cystitis   ? Irritable bowel syndrome with diarrhea 04/03/2016  ? Left ventricular hypertrophy, mild 02/25/2016  ? ECHOcardiogram report 06/17/15 showing EF55-60%, mild LVH and G1DD   ? 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[place]) 03/10/2018  ? [facility]  ?  Lumbar facet joint pain   ? 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  ? Mood disorder ([place])   ? Morbid obesity ([place]) 03/06/2017  ? Musculoskeletal neck pain 07/14/2015  ? Nocturnal hypoxemia 03/06/2017  ? Normal coronary arteries 05/14/2014  ? Obesity (BMI 30.0-34.9) 01/29/2017  ? Osteoarthritis of left hip 11/01/2015  ? MRI order by Dr Alvan Dame (ortho) 10/2015 showed significant arthritis of left hip joint with cystic changes in femoral head c/w osteoarthritis  ? 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 Other insomnia 11/09/2016  ? Overweight   ? Pain in joint of left shoulder 11/09/2016  ? Pain in joint, multiple sites 11/18/2018  ? Palpitations 05/15/2013  ? [facility] manages  ? Periodic limb movement sleep disorder 03/28/2017  ? Perirectal cyst 05/07/2016  ? Poison ivy dermatitis 03/24/2021  ? PONV (postoperative nausea and vomiting)   ? Positive ANA (antinuclear antibody) 11/18/2018  ? Post concussion syndrome 06/06/2015  ? Post concussive syndrome 07/14/2015  ? [name]'s post-concussive syndrome manifesting in vertigo and headache, mood changes, poor balance, dizziness, and decreased concentration per Dr Jaynee Eagles at [facility].   ? Posterior vitreous detachment of right eye 2014  ? Rectal abscess 05/10/2021  ? S/P left THA, AA 07/25/2016  ? Shingles   ? Shortness of breath dyspnea   ? with exertion  ? Sicca syndrome ([place]) 11/18/2018  ? (+) ANA  ? Sleep walking and eating 03/06/2017  ? Snoring 03/06/2017  ? Spondylosis of lumbar region without myelopathy or radiculopathy 10/30/2011  ? TFC (triangular fibrocartilage complex) injury 02/25/2019  ? Thyroid nodule 08/11/2009  ? Findings: The thyroid gland is within normal limits in size.  The gland is diffusely inhomogeneous. A small solid nodule is noted in the lower pole  medially on the right of 7 x 6 x 8 mm. A small solid nodule is noted inferiorly on the left of 3 x 3 x 4 mm.  IMPRESSION:  The thyroid  gland is within normal limits in size with only small solid nodules present, the largest of only 8 mm in diameter on the right.    ? Trochanteric bursitis of left hip   ? 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   ? Trochanteric bursitis, right hip 04/26/2020  ? Tubular adenoma of colon 01/28/2021  ? Colonoscopy screening 4 mm tubular Adenoma polyp Wilford Corner, MD [facility])  ? Tubular adenoma of colon 01/28/2021  ? Colonoscopy screening 4 mm tubular Adenoma polyp Wilford Corner, MD [facility])  ? Vasomotor symptoms due to menopause 04/19/2017  ? Vitamin D deficiency 05/08/2017  ? Vulvitis 07/22/2020  ? Yeast vaginitis 07/11/2019  ? ? ?Past Surgical History:  ?Procedure Laterality Date  ? Bladder dilitation    ? x 3  ? BREAST BIOPSY Right 2011  ? Benign histology  ? CARDIOVASCULAR STRESS TEST  2000  ? Unremarkable per pt report  ? CARPOMETACARPAL JOINT ARTHROTOMY Right 2011  ? COLONOSCOPY    ? COLONOSCOPY WITH PROPOFOL N/A 04/21/2015  ?  Procedure: COLONOSCOPY WITH PROPOFOL;  Surgeon: Carol Ada, MD;  Location: [facility];  Service: Endoscopy;  Laterality: N/A;  ? EPIDURAL BLOCK INJECTION Left 04/12/2016  ? Left Medial Nerve Block and Left L5 ramus block, Dr Suella Broad   ? EPIDURAL BLOCK INJECTION  03/21/2016  ? Left L3-4 medial branch block and Left L5 & dorsal ramus block   ? EPIDURAL BLOCK INJECTION N/A 10/25/2016  ? Suella Broad, MD. Lumbar medial branch block  ? EPIDURAL BLOCK INJECTION N/A 02/09/2017  ? Suella Broad, MD.  Bilateral L3/4 medial branch block, bilateral L5 dorsal ramus block  ? EPIDURAL BLOCK INJECTION N/A 07/04/2017  ? Suella Broad, MD  ? FECAL TRANSPLANT  04/21/2015  ? Procedure: FECAL TRANSPLANT;  Surgeon: Carol Ada, MD;  Location: [place];  Service: Endoscopy;;  ? HIP ARTHROPLASTY Left   ? HIP ARTHROSCOPY Left 03/06/2018  ? Left hip arthroplasty, redo for loose hip arthroplasty. Procedure at [facility]  ? [place]  Left 11/2015  ? for OA by Dr Suella Broad  ? INTERSTIM IMPLANT PLACEMENT  04/2021  ? OTHER SURGICAL HISTORY Left 2016  ? Left L3/L4 medial nerve block and Left L5 Dorsal Ramus block Dr Mickel Duhamel  ? TOTAL HIP

## 2021-11-24 NOTE — Therapy (Signed)
[place] ?[facility] ?[address], [address] ?[place], [place], [address] ?Phone: [phone]   Fax:  [phone] ? ?Physical Therapy Evaluation ? ?Patient Details  ?Name: Molly Giles ?MRN: [id number] ?Date of Birth: 1956/06/22 ?Referring Provider (PT): Debbora Presto, NP ? ? ?Encounter Date: 11/24/2021 ? ? PT End of Session - 11/24/21 1818   ? ? Visit Number 5   ? Date for PT Re-Evaluation 01/03/22   ? Authorization Type Medicare   ? Progress Note Due on Visit 10   ? PT Start Time 1545   ? PT Stop Time 1630   ? PT Time Calculation (min) 45 min   ? Activity Tolerance Patient tolerated treatment well   ? Behavior During Therapy [facility] for tasks assessed/performed   ? ?  ?  ? ?  ? ? ?Past Medical History:  ?Diagnosis Date  ? Abdominal bloating 05/10/2021  ? Acute gastritis 11/19/2020  ? Allergic rhinoconjunctivitis 07/02/2015  ? Anal fissure 11/19/2020  ? Arthritis   ? Benign positional vertigo 04/2015  ? Responded well to Vest[facility] Rehab  ? Bruxism (teeth grinding)   ? Chronic contact dermatitis 08/06/2018  ? Per allergist Dr Pablo Lawrence.   ? Chronic migraine 02/25/2016  ? Per Dr Jaynee Eagles review in notes from [facility] from September 2015. Showed total headache days last month 18. Severe headache days 7 days. Moderate headache days 5 days. Mild headache days last month sick days. Days without headache last month 10 days. Symptoms associated with photophobia, phonophobia, osmophobia, neck pain, dizziness, jaw pain, nasal congestion, vision disturbances, tingling and numbness, weakness and worsening with activity. Each headache attack last 3 hours depending on treatment in severity. Left side, the right side, easier side, the frontal area in the back of the head. Characterized as throbbing, pressure, tightness, squeezing, stabbing and burning   ? Chronic migraine without aura 05/15/2013  ?  [facility]  ? Chronic tonsillitis 01/27/2019  ? DDD  (degenerative disc disease), cervical 11/18/2018  ? DDD (degenerative disc disease), lumbar   ? Depression   ? Disc displacement, lumbar   ? Dysrhythmia   ? seen by dr Wynonia Lawman- not a problem since she has been on Bystolic  ? Episodic cluster headache, not intractable 03/06/2017  ? Essential hypertension 05/15/2013  ? Family history of adverse reaction to anesthesia   ? Brother- N/V  ? Family history of premature CAD 05/15/2013  ? Fibromyalgia syndrome 07/01/2015  ? Management by Dr Chauncey Cruel. Devashwar (Rheum)   ? GERD (gastroesophageal reflux disease) 12/16/2014  ? H/O seasonal allergies   ? Hammer toe   ? Left great toe  ? Hashimoto's thyroiditis   ? Per patient, diagnosed by Dr. Modena Nunnery  ? Hearing loss of both ears 07/27/2015  ? mild to borderline moderate low frequency hearing loss improving to within normal limits bilaterally on audiology testing at [facility] in November 2016.    ? History of Clostridium difficile colitis 07/01/2015  ? Required Fecal Transplantation tocure  ? History of colonic polyps   ? History of left hip replacement 09/20/2017  ? History of revision of total hip arthroplasty 04/10/2018  ? Hx of bad fall 02/2015  ? Severe Facial/head trauma without fracture  ? 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 Hyperlipidemia 1998  ? Hypokalemia due to excessive gastrointestinal loss of potassium 07/28/2019  ? Hypothyroidism   ? Impairment of balance 02/2015  ? Consequent of postconcussive syndrome  ? Injury  of triangular fibrocartilage complex of left wrist 02/25/2019  ? Dx 02/25/19 Iran Planas IV MD [facility])  ? Insulin resistance 07/04/2017  ? Internal hemorrhoid 01/28/2021  ? Internal hemorrhoid seen on colonoscopy 10/2020 Warnell Bureau MD [facility])  ? Interstitial cystitis   ? Irritable bowel syndrome with diarrhea 04/03/2016  ? Left ventricular hypertrophy, mild 02/25/2016  ? ECHOcardiogram report 06/17/15 showing EF55-60%, mild LVH and G1DD   ? 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[place])  03/10/2018  ? [facility]  ? Lumbar facet joint pain   ? 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  ? Mood disorder ([place])   ? Morbid obesity ([place]) 03/06/2017  ? Musculoskeletal neck pain 07/14/2015  ? Nocturnal hypoxemia 03/06/2017  ? Normal coronary arteries 05/14/2014  ? Obesity (BMI 30.0-34.9) 01/29/2017  ? Osteoarthritis of left hip 11/01/2015  ? MRI order by Dr Alvan Dame (ortho) 10/2015 showed significant arthritis of left hip joint with cystic changes in femoral head c/w osteoarthritis  ? 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 Other insomnia 11/09/2016  ? Overweight   ? Pain in joint of left shoulder 11/09/2016  ? Pain in joint, multiple sites 11/18/2018  ? Palpitations 05/15/2013  ? [facility] manages  ? Periodic limb movement sleep disorder 03/28/2017  ? Perirectal cyst 05/07/2016  ? Poison ivy dermatitis 03/24/2021  ? PONV (postoperative nausea and vomiting)   ? Positive ANA (antinuclear antibody) 11/18/2018  ? Post concussion syndrome 06/06/2015  ? Post concussive syndrome 07/14/2015  ? Ms Hand's post-concussive syndrome manifesting in vertigo and headache, mood changes, poor balance, dizziness, and decreased concentration per Dr Jaynee Eagles at [facility].   ? Posterior vitreous detachment of right eye 2014  ? Rectal abscess 05/10/2021  ? S/P left THA, AA 07/25/2016  ? Shingles   ? Shortness of breath dyspnea   ? with exertion  ? Sicca syndrome ([place]) 11/18/2018  ? (+) ANA  ? Sleep walking and eating 03/06/2017  ? Snoring 03/06/2017  ? Spondylosis of lumbar region without myelopathy or radiculopathy 10/30/2011  ? TFC (triangular fibrocartilage complex) injury 02/25/2019  ? Thyroid nodule 08/11/2009  ? Findings: The thyroid gland is within normal limits in size.  The gland is diffusely inhomogeneous. A small solid nodule is noted in the lower pole  medially on the right of 7 x 6 x 8 mm. A small solid nodule is noted inferiorly on the left of 3 x 3 x 4  mm.  IMPRESSION:  The thyroid gland is within normal limits in size with only small solid nodules present, the largest of only 8 mm in diameter on the right.    ? Trochanteric bursitis of left hip   ? 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   ? Trochanteric bursitis, right hip 04/26/2020  ? Tubular adenoma of colon 01/28/2021  ? Colonoscopy screening 4 mm tubular Adenoma polyp Wilford Corner, MD [facility])  ? Tubular adenoma of colon 01/28/2021  ? Colonoscopy screening 4 mm tubular Adenoma polyp Wilford Corner, MD [facility])  ? Vasomotor symptoms due to menopause 04/19/2017  ? Vitamin D deficiency 05/08/2017  ? Vulvitis 07/22/2020  ? Yeast vaginitis 07/11/2019  ? ? ?Past Surgical History:  ?Procedure Laterality Date  ? Bladder dilitation    ? x 3  ? BREAST BIOPSY Right 2011  ? Benign histology  ? CARDIOVASCULAR STRESS TEST  2000  ? Unremarkable per pt report  ? CARPOMETACARPAL JOINT ARTHROTOMY Right 2011  ? COLONOSCOPY    ?  COLONOSCOPY WITH PROPOFOL N/A 04/21/2015  ? Procedure: COLONOSCOPY WITH PROPOFOL;  Surgeon: Carol Ada, MD;  Location: [facility];  Service: Endoscopy;  Laterality: N/A;  ? EPIDURAL BLOCK INJECTION Left 04/12/2016  ? Left Medial Nerve Block and Left L5 ramus block, Dr Suella Broad   ? EPIDURAL BLOCK INJECTION  03/21/2016  ? Left L3-4 medial branch block and Left L5 & dorsal ramus block   ? EPIDURAL BLOCK INJECTION N/A 10/25/2016  ? Suella Broad, MD. Lumbar medial branch block  ? EPIDURAL BLOCK INJECTION N/A 02/09/2017  ? Suella Broad, MD.  Bilateral L3/4 medial branch block, bilateral L5 dorsal ramus block  ? EPIDURAL BLOCK INJECTION N/A 07/04/2017  ? Suella Broad, MD  ? FECAL TRANSPLANT  04/21/2015  ? Procedure: FECAL TRANSPLANT;  Surgeon: Carol Ada, MD;  Location: [facility];  Service: Endoscopy;;  ? HIP ARTHROPLASTY Left   ? HIP ARTHROSCOPY Left 03/06/2018  ? Left hip arthroplasty, redo for loose hip arthroplasty. Procedure at [facility] hospital  ?  [place] Left 11/2015  ? for OA by Dr Suella Broad  ? INTERSTIM IMPLANT PLACEMENT  04/2021  ? OTHER SURGICAL HISTORY Left 2016  ? Left L3/L4 medial nerve block and Left L5 Dorsal Ramus bloc

## 2021-11-25 ENCOUNTER — Telehealth: Payer: Self-pay | Admitting: Rehabilitative and Restorative Service Providers"

## 2021-11-25 NOTE — Telephone Encounter (Signed)
Camesha left VM for PT @ [employer].  Received message and called patient with no answer today.  Will attempt to call back.  Per message, the patient has worsening symptoms of vertigo after yesterday's treatment. ?Almira [place], [place] ? ?

## 2021-11-26 ENCOUNTER — Encounter: Payer: Self-pay | Admitting: Rehabilitative and Restorative Service Providers"

## 2021-11-28 ENCOUNTER — Ambulatory Visit: Payer: Medicare Other | Admitting: Rehabilitative and Restorative Service Providers"

## 2021-11-28 ENCOUNTER — Other Ambulatory Visit: Payer: Self-pay

## 2021-11-28 DIAGNOSIS — M62838 Other muscle spasm: Secondary | ICD-10-CM | POA: Diagnosis not present

## 2021-11-28 DIAGNOSIS — H8112 Benign paroxysmal vertigo, left ear: Secondary | ICD-10-CM

## 2021-11-28 DIAGNOSIS — R42 Dizziness and giddiness: Secondary | ICD-10-CM

## 2021-11-28 NOTE — Therapy (Signed)
[place] ?[facility] ?[address], [address] ?[place], [place], [address] ?Phone: [phone]   Fax:  [phone] ? ?Physical Therapy Treatment ? ?Patient Details  ?Name: Molly Giles ?MRN: [id number] ?Date of Birth: [birth year] ?Referring Provider (PT): Debbora Presto, NP ? ? ?Encounter Date: 11/28/2021 ? ? PT End of Session - 11/28/21 1716   ? ? Visit Number 6   eval + 1x/week for 4 weeks (vestibular)  ? Number of Visits --   2/5  ? Date for PT Re-Evaluation 01/03/22   ? Authorization Type Medicare   ? Progress Note Due on Visit 10   ? PT Start Time 1400   ? PT Stop Time 1440   ? PT Time Calculation (min) 40 min   ? Activity Tolerance Patient tolerated treatment well   ? Behavior During Therapy [facility] for tasks assessed/performed   ? ?  ?  ? ?  ? ? ?Past Medical History:  ?Diagnosis Date  ? Abdominal bloating 05/10/2021  ? Acute gastritis 11/19/2020  ? Allergic rhinoconjunctivitis 07/02/2015  ? Anal fissure 11/19/2020  ? Arthritis   ? Benign positional vertigo 04/2015  ? Responded well to Vestibular Rehab  ? Bruxism (teeth grinding)   ? Chronic contact dermatitis 08/06/2018  ? Per allergist Dr Pablo Lawrence.   ? Chronic migraine 02/25/2016  ? Per Dr Jaynee Eagles review in notes from [facility] from September 2015. Showed total headache days last month 18. Severe headache days 7 days. Moderate headache days 5 days. Mild headache days last month sick days. Days without headache last month 10 days. Symptoms associated with photophobia, phonophobia, osmophobia, neck pain, dizziness, jaw pain, nasal congestion, vision disturbances, tingling and numbness, weakness and worsening with activity. Each headache attack last 3 hours depending on treatment in severity. Left side, the right side, easier side, the frontal area in the back of the head. Characterized as throbbing, pressure, tightness, squeezing, stabbing and burning   ? Chronic migraine without aura 05/15/2013  ?  [facility]  ? Chronic tonsillitis 01/27/2019  ? DDD (degenerative disc disease), cervical 11/18/2018  ? DDD (degenerative disc disease), lumbar   ? Depression   ? Disc displacement, lumbar   ? Dysrhythmia   ? seen by dr Wynonia Lawman- not a problem since she has been on Bystolic  ? Episodic cluster headache, not intractable 03/06/2017  ? Essential hypertension 05/15/2013  ? Family history of adverse reaction to anesthesia   ? Brother- N/V  ? Family history of premature CAD 05/15/2013  ? Fibromyalgia syndrome 07/01/2015  ? Management by Dr Chauncey Cruel. Devashwar (Rheum)   ? GERD (gastroesophageal reflux disease) 12/16/2014  ? H/O seasonal allergies   ? Hammer toe   ? Left great toe  ? Hashimoto's thyroiditis   ? Per patient, diagnosed by Dr. Modena Nunnery  ? Hearing loss of both ears 07/27/2015  ? mild to borderline moderate low frequency hearing loss improving to within normal limits bilaterally on audiology testing at [facility] in November 2016.    ? History of Clostridium difficile colitis 07/01/2015  ? Required Fecal Transplantation tocure  ? History of colonic polyps   ? History of left hip replacement 09/20/2017  ? History of revision of total hip arthroplasty 04/10/2018  ? Hx of bad fall 02/2015  ? Severe Facial/head trauma without fracture  ? 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 Hyperlipidemia 1998  ? Hypokalemia due to excessive gastrointestinal loss of potassium 07/28/2019  ?  Hypothyroidism   ? Impairment of balance 02/2015  ? Consequent of postconcussive syndrome  ? Injury of triangular fibrocartilage complex of left wrist 02/25/2019  ? Dx 02/25/19 Iran Planas IV MD [facility])  ? Insulin resistance 07/04/2017  ? Internal hemorrhoid 01/28/2021  ? Internal hemorrhoid seen on colonoscopy 10/2020 Warnell Bureau MD [facility])  ? Interstitial cystitis   ? Irritable bowel syndrome with diarrhea 04/03/2016  ? Left ventricular hypertrophy, mild 02/25/2016  ? ECHOcardiogram report 06/17/15 showing  EF55-60%, mild LVH and G1DD   ? 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[place]) 03/10/2018  ? [facility]  ? Lumbar facet joint pain   ? 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  ? Mood disorder ([place])   ? Morbid obesity ([place]) 03/06/2017  ? Musculoskeletal neck pain 07/14/2015  ? Nocturnal hypoxemia 03/06/2017  ? Normal coronary arteries 05/14/2014  ? Obesity (BMI 30.0-34.9) 01/29/2017  ? Osteoarthritis of left hip 11/01/2015  ? MRI order by Dr Alvan Dame (ortho) 10/2015 showed significant arthritis of left hip joint with cystic changes in femoral head c/w osteoarthritis  ? 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 Other insomnia 11/09/2016  ? Overweight   ? Pain in joint of left shoulder 11/09/2016  ? Pain in joint, multiple sites 11/18/2018  ? Palpitations 05/15/2013  ? [facility] manages  ? Periodic limb movement sleep disorder 03/28/2017  ? Perirectal cyst 05/07/2016  ? Poison ivy dermatitis 03/24/2021  ? PONV (postoperative nausea and vomiting)   ? Positive ANA (antinuclear antibody) 11/18/2018  ? Post concussion syndrome 06/06/2015  ? Post concussive syndrome 07/14/2015  ? Ms Magda's post-concussive syndrome manifesting in vertigo and headache, mood changes, poor balance, dizziness, and decreased concentration per Dr Jaynee Eagles at [facility].   ? Posterior vitreous detachment of right eye 2014  ? Rectal abscess 05/10/2021  ? S/P left THA, AA 07/25/2016  ? Shingles   ? Shortness of breath dyspnea   ? with exertion  ? Sicca syndrome ([place]) 11/18/2018  ? (+) ANA  ? Sleep walking and eating 03/06/2017  ? Snoring 03/06/2017  ? Spondylosis of lumbar region without myelopathy or radiculopathy 10/30/2011  ? TFC (triangular fibrocartilage complex) injury 02/25/2019  ? Thyroid nodule 08/11/2009  ? Findings: The thyroid gland is within normal limits in size.  The gland is diffusely inhomogeneous. A small solid nodule is noted in the lower pole  medially on the right of 7 x 6 x 8 mm. A  small solid nodule is noted inferiorly on the left of 3 x 3 x 4 mm.  IMPRESSION:  The thyroid gland is within normal limits in size with only small solid nodules present, the largest of only 8 mm in diameter on the right.    ? Trochanteric bursitis of left hip   ? 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   ? Trochanteric bursitis, right hip 04/26/2020  ? Tubular adenoma of colon 01/28/2021  ? Colonoscopy screening 4 mm tubular Adenoma polyp Wilford Corner, MD [facility])  ? Tubular adenoma of colon 01/28/2021  ? Colonoscopy screening 4 mm tubular Adenoma polyp Wilford Corner, MD [facility])  ? Vasomotor symptoms due to menopause 04/19/2017  ? Vitamin D deficiency 05/08/2017  ? Vulvitis 07/22/2020  ? Yeast vaginitis 07/11/2019  ? ? ?Past Surgical History:  ?Procedure Laterality Date  ? Bladder dilitation    ? x 3  ? BREAST BIOPSY Right 2011  ? Benign histology  ? CARDIOVASCULAR STRESS TEST  2000  ?  Unremarkable per pt report  ? CARPOMETACARPAL JOINT ARTHROTOMY Right 2011  ? COLONOSCOPY    ? COLONOSCOPY WITH PROPOFOL N/A 04/21/2015  ? Procedure: COLONOSCOPY WITH PROPOFOL;  Surgeon: Carol Ada, MD;  Location: [facility];  Service: Endoscopy;  Laterality: N/A;  ? EPIDURAL BLOCK INJECTION Left 04/12/2016  ? Left Medial Nerve Block and Left L5 ramus block, Dr Suella Broad   ? EPIDURAL BLOCK INJECTION  03/21/2016  ? Left L3-4 medial branch block and Left L5 & dorsal ramus block   ? EPIDURAL BLOCK INJECTION N/A 10/25/2016  ? Suella Broad, MD. Lumbar medial branch block  ? EPIDURAL BLOCK INJECTION N/A 02/09/2017  ? Suella Broad, MD.  Bilateral L3/4 medial branch block, bilateral L5 dorsal ramus block  ? EPIDURAL BLOCK INJECTION N/A 07/04/2017  ? Suella Broad, MD  ? FECAL TRANSPLANT  04/21/2015  ? Procedure: FECAL TRANSPLANT;  Surgeon: Carol Ada, MD;  Location: [facility];  Service: Endoscopy;;  ? HIP ARTHROPLASTY Left   ? HIP ARTHROSCOPY Left 03/06/2018  ? Left hip arthroplasty, redo for  loose hip arthroplasty. Procedure at [facility]  ? [place] Left 11/2015  ? for OA by Dr Suella Broad  ? INTERSTIM IMPLANT PLACEMENT  04/2021  ? OTHER SURGICAL HISTORY [place]

## 2021-12-01 ENCOUNTER — Other Ambulatory Visit: Payer: Self-pay

## 2021-12-01 ENCOUNTER — Ambulatory Visit: Payer: Medicare Other

## 2021-12-01 ENCOUNTER — Ambulatory Visit: Payer: Medicare Other | Admitting: Rehabilitative and Restorative Service Providers"

## 2021-12-01 DIAGNOSIS — M62838 Other muscle spasm: Secondary | ICD-10-CM | POA: Diagnosis not present

## 2021-12-01 DIAGNOSIS — R278 Other lack of coordination: Secondary | ICD-10-CM

## 2021-12-01 DIAGNOSIS — M6281 Muscle weakness (generalized): Secondary | ICD-10-CM

## 2021-12-01 NOTE — Therapy (Addendum)
?OUTPATIENT PHYSICAL THERAPY FEMALE PELVIC EVALUATION ? ? ?Patient Name: Molly Giles ?MRN: [id number] ?DOB:1955/11/06, 66 y.o., female ?Today's Date: 12/01/2021 ? ? PT End of Session - 12/01/21 1100   ? ? Visit Number 7   ? Date for PT Re-Evaluation 01/03/22   ? Authorization Type Medicare   ? Progress Note Due on Visit 10   ? PT Start Time 1100   ? PT Stop Time 1144   ? PT Time Calculation (min) 44 min   ? Activity Tolerance Patient tolerated treatment well   ? Behavior During Therapy [facility] for tasks assessed/performed   ? ?  ?  ? ?  ? ? ?Past Medical History:  ?Diagnosis Date  ? Abdominal bloating 05/10/2021  ? Acute gastritis 11/19/2020  ? Allergic rhinoconjunctivitis 07/02/2015  ? Anal fissure 11/19/2020  ? Arthritis   ? Benign positional vertigo 04/2015  ? Responded well to [facility]  ? Bruxism (teeth grinding)   ? Chronic contact dermatitis 08/06/2018  ? Per allergist Dr Pablo Lawrence.   ? Chronic migraine 02/25/2016  ? Per Dr Jaynee Eagles review in notes from [facility] from September 2015. Showed total headache days last month 18. Severe headache days 7 days. Moderate headache days 5 days. Mild headache days last month sick days. Days without headache last month 10 days. Symptoms associated with photophobia, phonophobia, osmophobia, neck pain, dizziness, jaw pain, nasal congestion, vision disturbances, tingling and numbness, weakness and worsening with activity. Each headache attack last 3 hours depending on treatment in severity. Left side, the right side, easier side, the frontal area in the back of the head. Characterized as throbbing, pressure, tightness, squeezing, stabbing and burning   ? Chronic migraine without aura 05/15/2013  ?  [facility]  ? Chronic tonsillitis 01/27/2019  ? DDD (degenerative disc disease), cervical 11/18/2018  ? DDD (degenerative disc disease), lumbar   ? Depression   ? Disc displacement, lumbar   ? Dysrhythmia   ? seen by dr  Wynonia Lawman- not a problem since she has been on Bystolic  ? Episodic cluster headache, not intractable 03/06/2017  ? Essential hypertension 05/15/2013  ? Family history of adverse reaction to anesthesia   ? Brother- N/V  ? Family history of premature CAD 05/15/2013  ? Fibromyalgia syndrome 07/01/2015  ? Management by Dr Chauncey Cruel. Devashwar ([facility]he[facility])   ? GERD (gastroesophageal reflux disease) 12/16/2014  ? H/O seasonal allergies   ? Hammer toe   ? Left great toe  ? Hashimoto's thyroiditis   ? Per patient, diagnosed by Dr. Modena Nunnery  ? Hearing loss of both ears 07/27/2015  ? mild to borderline moderate low frequency hearing loss improving to within normal limits bilaterally on audiology testing at [facility] in November 2016.    ? History of Clostridium difficile colitis 07/01/2015  ? Required Fecal Transplantation tocure  ? History of colonic polyps   ? History of left hip replacement 09/20/2017  ? History of revision of total hip arthroplasty 04/10/2018  ? Hx of bad fall 02/2015  ? Severe Facial/head trauma without fracture  ? 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 Hyperlipidemia 1998  ? Hypokalemia due to excessive gastrointestinal loss of potassium 07/28/2019  ? Hypothyroidism   ? Impairment of balance 02/2015  ? Consequent of postconcussive syndrome  ? Injury of triangular fibrocartilage complex of left wrist 02/25/2019  ? Dx 02/25/19 Iran Planas IV MD [facility])  ? Insulin resistance 07/04/2017  ? Internal hemorrhoid 01/28/2021  ? Internal  hemorrhoid seen on colonoscopy 10/2020 Warnell Bureau MD [facility])  ? Interstitial cystitis   ? Irritable bowel syndrome with diarrhea 04/03/2016  ? Left ventricular hypertrophy, mild 02/25/2016  ? ECHOcardiogram report 06/17/15 showing EF55-60%, mild LVH and G1DD   ? 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[place]) 03/10/2018  ? [facility]  ? Lumbar facet joint pain   ? 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  ? Mood disorder ([place])   ? Morbid obesity ([place]) 03/06/2017  ?  Musculoskeletal neck pain 07/14/2015  ? Nocturnal hypoxemia 03/06/2017  ? Normal coronary arteries 05/14/2014  ? Obesity (BMI 30.0-34.9) 01/29/2017  ? Osteoarthritis of left hip 11/01/2015  ? MRI order by Dr Alvan Dame (ortho) 10/2015 showed significant arthritis of left hip joint with cystic changes in femoral head c/w osteoarthritis  ? 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 Other insomnia 11/09/2016  ? Overweight   ? Pain in joint of left shoulder 11/09/2016  ? Pain in joint, multiple sites 11/18/2018  ? Palpitations 05/15/2013  ? [facility] manages  ? Periodic limb movement sleep disorder 03/28/2017  ? Perirectal cyst 05/07/2016  ? Poison ivy dermatitis 03/24/2021  ? PONV (postoperative nausea and vomiting)   ? Positive ANA (antinuclear antibody) 11/18/2018  ? Post concussion syndrome 06/06/2015  ? Post concussive syndrome 07/14/2015  ? Ms Paske's post-concussive syndrome manifesting in vertigo and headache, mood changes, poor balance, dizziness, and decreased concentration per Dr Jaynee Eagles at [facility].   ? Posterior vitreous detachment of right eye 2014  ? Rectal abscess 05/10/2021  ? S/P left THA, AA 07/25/2016  ? Shingles   ? Shortness of breath dyspnea   ? with exertion  ? Sicca syndrome ([place]) 11/18/2018  ? (+) ANA  ? Sleep walking and eating 03/06/2017  ? Snoring 03/06/2017  ? Spondylosis of lumbar region without myelopathy or radiculopathy 10/30/2011  ? TFC (triangular fibrocartilage complex) injury 02/25/2019  ? Thyroid nodule 08/11/2009  ? Findings: The thyroid gland is within normal limits in size.  The gland is diffusely inhomogeneous. A small solid nodule is noted in the lower pole  medially on the right of 7 x 6 x 8 mm. A small solid nodule is noted inferiorly on the left of 3 x 3 x 4 mm.  IMPRESSION:  The thyroid gland is within normal limits in size with only small solid nodules present, the largest of only 8 mm in diameter on the right.    ?  Trochanteric bursitis of left hip   ? 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   ? Trochanteric bursitis, right hip 04/26/2020  ? Tubular adenoma of colon 01/28/2021  ? Colonoscopy screening 4 mm tubular Adenoma polyp Wilford Corner, MD [facility])  ? Tubular adenoma of colon 01/28/2021  ? Colonoscopy screening 4 mm tubular Adenoma polyp Wilford Corner, MD [facility])  ? Vasomotor symptoms due to menopause 04/19/2017  ? Vitamin D deficiency 05/08/2017  ? Vulvitis 07/22/2020  ? Yeast vaginitis 07/11/2019  ? ?Past Surgical History:  ?Procedure Laterality Date  ? Bladder dilitation    ? x 3  ? BREAST BIOPSY Right 2011  ? Benign histology  ? CARDIOVASCULAR STRESS TEST  2000  ? Unremarkable per pt report  ? CARPOMETACARPAL JOINT ARTHROTOMY Right 2011  ? COLONOSCOPY    ? COLONOSCOPY WITH PROPOFOL N/A 04/21/2015  ? Procedure: COLONOSCOPY WITH PROPOFOL;  Surgeon: Carol Ada, MD;  Location: [place];  Service: Endoscopy;  Laterality: N/A;  ?  EPIDURAL BLOCK INJECTION Left 04/12/2016  ? Left Medial Nerve Block and Left L5 ramus block, Dr Suella Broad   ? EPIDURAL BLOCK INJECTION  03/21/2016  ? Left L3-4 medial branch block and Left L5 & dorsal ramus block   ? EPIDURAL BLOCK INJECTION N/A 10/25/2016  ? Suella Broad, MD. Lumbar medial branch block  ? EPIDURAL BLOCK INJECTION N/A 02/09/2017  ? Suella Broad, MD.  Bilateral L3/4 medial branch block, bilateral L5 dorsal ramus block  ? EPIDURAL BLOCK INJECTION N/A 07/04/2017  ? Suella Broad, MD  ? FECAL TRANSPLANT  04/21/2015  ? Procedure: FECAL TRANSPLANT;  Surgeon: Carol Ada, MD;  Location: [place];  Service: Endoscopy;;  ? HIP ARTHROPLASTY Left   ? HIP ARTHROSCOPY Left 03/06/2018  ? Left hip arthroplasty, redo for loose hip arthroplasty. Procedure at [facility]  ? [place] Left 11/2015  ? for OA by Dr Suella Broad  ? INTERSTIM IMPLANT PLACEMENT  04/2021  ? OTHER SURGICAL HISTORY Left 2016  ? Left L3/L4  medial nerve block and Left L5 Dorsal Ramus block Dr Mickel Duhamel  ? TOTAL HIP ARTHROPLASTY Left 07/25/2016  ? Procedure: LEFT TOTAL HIP ARTHROPLASTY ANTERIOR APPROACH;  Surgeon: Paralee Cancel, MD;  Location: [facility];  Imagene [place]hes

## 2021-12-06 ENCOUNTER — Ambulatory Visit: Payer: Medicare Other

## 2021-12-06 ENCOUNTER — Other Ambulatory Visit: Payer: Self-pay

## 2021-12-06 DIAGNOSIS — M62838 Other muscle spasm: Secondary | ICD-10-CM | POA: Diagnosis not present

## 2021-12-06 DIAGNOSIS — M6281 Muscle weakness (generalized): Secondary | ICD-10-CM

## 2021-12-06 DIAGNOSIS — R278 Other lack of coordination: Secondary | ICD-10-CM

## 2021-12-06 NOTE — Progress Notes (Deleted)
? ?Office Visit Note ? ?Patient: Molly Giles             ?Date of Birth: 27-Nov-1955           ?MRN: [id number]             ?PCP: McDiarmid, Blane Ohara, MD ?Referring: McDiarmid, Blane Ohara, MD ?Visit Date: 12/20/2021 ?Occupation: '@GUAROCC'$ @ ? ?Subjective:  ?No chief complaint on file. ? ? ?History of Present Illness: Molly Giles is a 66 y.o. female ***  ? ?Activities of Daily Living:  ?Patient reports morning stiffness for *** {minute/hour:19697}.   ?Patient {ACTIONS;DENIES/REPORTS:21021675::"Denies"} nocturnal pain.  ?Difficulty dressing/grooming: {ACTIONS;DENIES/REPORTS:21021675::"Denies"} ?Difficulty climbing stairs: {ACTIONS;DENIES/REPORTS:21021675::"Denies"} ?Difficulty getting out of chair: {ACTIONS;DENIES/REPORTS:21021675::"Denies"} ?Difficulty using hands for taps, buttons, cutlery, and/or writing: {ACTIONS;DENIES/REPORTS:21021675::"Denies"} ? ?No Rheumatology ROS completed.  ? ?PMFS History:  ?Patient Active Problem List  ? Diagnosis Date Noted  ? Meibomian gland dysfunction (MGD) of both eyes 10/17/2021  ? Nuclear sclerosis of both eyes 10/17/2021  ? Epiretinal membrane (ERM) of left eye 10/17/2021  ? Dysphagia 10/11/2021  ? OAB (overactive bladder) 03/02/2021  ? Anterior to posterior tear of superior glenoid labrum of left shoulder 02/22/2021  ? Osteoarthritis of acromioclavicular joint 02/22/2021  ? 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 Chronic low back pain 05/14/2019  ? Sjogren's syndrome ([place]) 01/27/2019  ? Cervical spondylosis 11/18/2018  ? DDD (degenerative disc disease), lumbar 11/18/2018  ? Keratoconjunctivitis sicca ([place]) 11/18/2018  ? Chronic contact dermatitis 08/06/2018  ? Primary osteoarthritis of left knee 06/20/2018  ? 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[place]) 11/29/2017  ? Vitamin D deficiency 05/08/2017  ? Periodic limb movement sleep disorder 03/28/2017  ? Obesity (BMI 30.0-34.9) 01/29/2017  ? Other insomnia 11/09/2016  ? Abdominal discomfort, generalized 05/07/2016  ? Irritable bowel  syndrome with diarrhea 04/03/2016  ? Left ventricular hypertrophy, mild 02/25/2016  ? Mood disorder ([place]) 07/02/2015  ? Allergic rhinoconjunctivitis 07/02/2015  ? Fibromyalgia syndrome 07/01/2015  ? Personal history of colonic polyps 07/01/2015  ? GERD (gastroesophageal reflux disease) 12/16/2014  ? Hypothyroidism 05/15/2013  ? Pure hypercholesterolemia 05/15/2013  ? Interstitial cystitis 05/15/2013  ? Chronic migraine without aura 05/15/2013  ?  ?Past Medical History:  ?Diagnosis Date  ? Abdominal bloating 05/10/2021  ? Acute gastritis 11/19/2020  ? Allergic rhinoconjunctivitis 07/02/2015  ? Anal fissure 11/19/2020  ? Arthritis   ? Benign positional vertigo 04/2015  ? Responded well to [facility]  ? Bruxism (teeth grinding)   ? Chronic contact dermatitis 08/06/2018  ? Per allergist Dr Pablo Lawrence.   ? Chronic migraine 02/25/2016  ? Per Dr Jaynee Eagles review in notes from [facility] from September 2015. Showed total headache days last month 18. Severe headache days 7 days. Moderate headache days 5 days. Mild headache days last month sick days. Days without headache last month 10 days. Symptoms associated with photophobia, phonophobia, osmophobia, neck pain, dizziness, jaw pain, nasal congestion, vision disturbances, tingling and numbness, weakness and worsening with activity. Each headache attack last 3 hours depending on treatment in severity. Left side, the right side, easier side, the frontal area in the back of the head. Characterized as throbbing, pressure, tightness, squeezing, stabbing and burning   ? Chronic migraine without aura 05/15/2013  ?  [facility]  ? Chronic tonsillitis 01/27/2019  ? DDD (degenerative disc disease), cervical 11/18/2018  ? DDD (degenerative disc disease), lumbar   ? Depression   ? Disc displacement, lumbar   ? Dysrhythmia   ? seen by dr Wynonia Lawman-  not a problem since she has been on Bystolic  ? Episodic cluster headache, not intractable  03/06/2017  ? Essential hypertension 05/15/2013  ? Family history of adverse reaction to anesthesia   ? Brother- N/V  ? Family history of premature CAD 05/15/2013  ? Fibromyalgia syndrome 07/01/2015  ? Management by Dr Chauncey Cruel. Devashwar ([facility])   ? GERD (gastroesophageal reflux disease) 12/16/2014  ? H/O seasonal allergies   ? Hammer toe   ? Left great toe  ? Hashimoto's thyroiditis   ? Per patient, diagnosed by Dr. Modena Nunnery  ? Hearing loss of both ears 07/27/2015  ? mild to borderline moderate low frequency hearing loss improving to within normal limits bilaterally on audiology testing at [facility] in November 2016.    ? History of Clostridium difficile colitis 07/01/2015  ? Required Fecal Transplantation tocure  ? History of colonic polyps   ? History of left hip replacement 09/20/2017  ? History of revision of total hip arthroplasty 04/10/2018  ? Hx of bad fall 02/2015  ? Severe Facial/head trauma without fracture  ? 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 Hyperlipidemia 1998  ? Hypokalemia due to excessive gastrointestinal loss of potassium 07/28/2019  ? Hypothyroidism   ? Impairment of balance 02/2015  ? Consequent of postconcussive syndrome  ? Injury of triangular fibrocartilage complex of left wrist 02/25/2019  ? Dx 02/25/19 Iran Planas IV MD [facility])  ? Insulin resistance 07/04/2017  ? Internal hemorrhoid 01/28/2021  ? Internal hemorrhoid seen on colonoscopy 10/2020 Warnell Bureau MD [facility])  ? Interstitial cystitis   ? Irritable bowel syndrome with diarrhea 04/03/2016  ? Left ventricular hypertrophy, mild 02/25/2016  ? ECHOcardiogram report 06/17/15 showing EF55-60%, mild LVH and G1DD   ? 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[place]) 03/10/2018  ? [facility]  ? Lumbar facet joint pain   ? 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  ? Mood disorder ([place])   ? Morbid obesity ([place]) 03/06/2017  ? Musculoskeletal neck pain 07/14/2015  ? Nocturnal hypoxemia 03/06/2017  ? Normal coronary arteries 05/14/2014   ? Obesity (BMI 30.0-34.9) 01/29/2017  ? Osteoarthritis of left hip 11/01/2015  ? MRI order by Dr Alvan Dame (ortho) 10/2015 showed significant arthritis of left hip joint with cystic changes in femoral head c/w osteoarthritis  ? 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 Other insomnia 11/09/2016  ? Overweight   ? Pain in joint of left shoulder 11/09/2016  ? Pain in joint, multiple sites 11/18/2018  ? Palpitations 05/15/2013  ? [facility] manages  ? Periodic limb movement sleep disorder 03/28/2017  ? Perirectal cyst 05/07/2016  ? Poison ivy dermatitis 03/24/2021  ? PONV (postoperative nausea and vomiting)   ? Positive ANA (antinuclear antibody) 11/18/2018  ? Post concussion syndrome 06/06/2015  ? Post concussive syndrome 07/14/2015  ? Ms Delman's post-concussive syndrome manifesting in vertigo and headache, mood changes, poor balance, dizziness, and decreased concentration per Dr [facility]nee Eagles at [facility].   ? Posterior vitreous detachment of right eye 2014  ? Rectal abscess 05/10/2021  ? S/P left THA, AA 07/25/2016  ? Shingles   ? Shortness of breath dyspnea   ? with exertion  ? 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 (+) ANA  ? Sleep walking and eating 03/06/2017  ? Snoring 03/06/2017  ? Spondylosis of lumbar region without myelopathy or radiculopathy 10/30/2011  ? TFC (triangular fibrocartilage complex) injury 02/25/2019  ? Thyroid nodule 08/11/2009  ? Findings: The thyroid gland is within normal limits  in size.  The gland is diffusely inhomogeneous. A small solid nodule is noted in the lower pole  medially on the right of 7 x 6 x 8 mm. A small solid nodule is noted inferiorly on the left of 3 x 3 x 4 mm.  IMPRESSION:  The thyroid gland is within normal limits in size with only small solid nodules present, the largest of only 8 mm in diameter on the right.    ? Trochanteric bursitis of left hip   ? 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   ?  Trochanteric bursitis, right hip 04/26/2020  ? Tubular adenoma of colon 01/28/2021  ? Colonoscopy screening 4 mm tubular Adenoma polyp Wilford Corner, MD [facility])  ? Tubular adenoma of colon 01/28/2021

## 2021-12-06 NOTE — Therapy (Signed)
?OUTPATIENT PHYSICAL THERAPY TREATMENT NOTE ? ? ?Patient Name: Molly Giles ?MRN: [id number] ?DOB:06/05/1956, 66 y.o., female ?Today's Date: 12/06/2021 ? ?PCP: McDiarmid, Blane Ohara, MD ?REFERRING PROVIDER: Christy Sartorius, MD ? ? PT End of Session - 12/06/21 1627   ? ? Visit Number 8   ? Date for PT Re-Evaluation 01/03/22   ? Authorization Type Medicare   ? Progress Note Due on Visit 10   ? PT Start Time [phone]   ? PT Stop Time 0277   ? PT Time Calculation (min) 40 min   ? Activity Tolerance Patient tolerated treatment well   ? Behavior During Therapy [facility] for tasks assessed/performed   ? ?  ?  ? ?  ? ? ?Past Medical History:  ?Diagnosis Date  ? Abdominal bloating 05/10/2021  ? Acute gastritis 11/19/2020  ? Allergic rhinoconjunctivitis 07/02/2015  ? Anal fissure 11/19/2020  ? Arthritis   ? Benign positional vertigo 04/2015  ? Responded well to Vestibular Rehab  ? Bruxism (teeth grinding)   ? Chronic contact dermatitis 08/06/2018  ? Per allergist Dr Pablo Lawrence.   ? Chronic migraine 02/25/2016  ? Per Dr Jaynee Eagles review in notes from [facility] from September 2015. Showed total headache days last month 18. Severe headache days 7 days. Moderate headache days 5 days. Mild headache days last month sick days. Days without headache last month 10 days. Symptoms associated with photophobia, phonophobia, osmophobia, neck pain, dizziness, jaw pain, nasal congestion, vision disturbances, tingling and numbness, weakness and worsening with activity. Each headache attack last 3 hours depending on treatment in severity. Left side, the right side, easier side, the frontal area in the back of the head. Characterized as throbbing, pressure, tightness, squeezing, stabbing and burning   ? Chronic migraine without aura 05/15/2013  ?  [facility]  ? Chronic tonsillitis 01/27/2019  ? DDD (degenerative disc disease), cervical 11/18/2018  ? DDD (degenerative disc disease), lumbar   ? Depression   ?  Disc displacement, lumbar   ? Dysrhythmia   ? seen by dr Wynonia Lawman- not a problem since she has been on Bystolic  ? Episodic cluster headache, not intractable 03/06/2017  ? Essential hypertension 05/15/2013  ? Family history of adverse reaction to anesthesia   ? Brother- N/V  ? Family history of premature CAD 05/15/2013  ? Fibromyalgia syndrome 07/01/2015  ? Management by Dr Chauncey Cruel. Devashwar (Rheum)   ? GERD (gastroesophageal reflux disease) 12/16/2014  ? H/O seasonal allergies   ? Hammer toe   ? Left great toe  ? Hashimoto's thyroiditis   ? Per patient, diagnosed by Dr. Modena Nunnery  ? Hearing loss of both ears 07/27/2015  ? mild to borderline moderate low frequency hearing loss improving to within normal limits bilaterally on audiology testing at [facility] in November 2016.    ? History of Clostridium difficile colitis 07/01/2015  ? Required Fecal Transplantation tocure  ? History of colonic polyps   ? History of left hip replacement 09/20/2017  ? History of revision of total hip arthroplasty 04/10/2018  ? Hx of bad fall 02/2015  ? Severe Facial/head trauma without fracture  ? 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 Hyperlipidemia 1998  ? Hypokalemia due to excessive gastrointestinal loss of potassium 07/28/2019  ? Hypothyroidism   ? Impairment of balance 02/2015  ? Consequent of postconcussive syndrome  ? Injury of triangular fibrocartilage complex of left wrist 02/25/2019  ? Dx 02/25/19 Iran Planas IV MD [facility])  ?  Insulin resistance 07/04/2017  ? Internal hemorrhoid 01/28/2021  ? Internal hemorrhoid seen on colonoscopy 10/2020 Warnell Bureau MD [facility])  ? Interstitial cystitis   ? Irritable bowel syndrome with diarrhea 04/03/2016  ? Left ventricular hypertrophy, mild 02/25/2016  ? ECHOcardiogram report 06/17/15 showing EF55-60%, mild LVH and G1DD   ? 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[place]) 03/10/2018  ? [facility]  ? Lumbar facet joint pain   ? 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  ? Mood  disorder ([place])   ? Morbid obesity ([place]) 03/06/2017  ? Musculoskeletal neck pain 07/14/2015  ? Nocturnal hypoxemia 03/06/2017  ? Normal coronary arteries 05/14/2014  ? Obesity (BMI 30.0-34.9) 01/29/2017  ? Osteoarthritis of left hip 11/01/2015  ? MRI order by Dr Alvan Dame (ortho) 10/2015 showed significant arthritis of left hip joint with cystic changes in femoral head c/w osteoarthritis  ? 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 Other insomnia 11/09/2016  ? Overweight   ? Pain in joint of left shoulder 11/09/2016  ? Pain in joint, multiple sites 11/18/2018  ? Palpitations 05/15/2013  ? [facility] manages  ? Periodic limb movement sleep disorder 03/28/2017  ? Perirectal cyst 05/07/2016  ? Poison ivy dermatitis 03/24/2021  ? PONV (postoperative nausea and vomiting)   ? Positive ANA (antinuclear antibody) 11/18/2018  ? Post concussion syndrome 06/06/2015  ? Post concussive syndrome 07/14/2015  ? Ms Lovins's post-concussive syndrome manifesting in vertigo and headache, mood changes, poor balance, dizziness, and decreased concentration per Dr Jaynee Eagles at [facility].   ? Posterior vitreous detachment of right eye 2014  ? Rectal abscess 05/10/2021  ? S/P left THA, AA 07/25/2016  ? Shingles   ? Shortness of breath dyspnea   ? with exertion  ? Sicca syndrome ([place]) 11/18/2018  ? (+) ANA  ? Sleep walking and eating 03/06/2017  ? Snoring 03/06/2017  ? Spondylosis of lumbar region without myelopathy or radiculopathy 10/30/2011  ? TFC (triangular fibrocartilage complex) injury 02/25/2019  ? Thyroid nodule 08/11/2009  ? Findings: The thyroid gland is within normal limits in size.  The gland is diffusely inhomogeneous. A small solid nodule is noted in the lower pole  medially on the right of 7 x 6 x 8 mm. A small solid nodule is noted inferiorly on the left of 3 x 3 x 4 mm.  IMPRESSION:  The thyroid gland is within normal limits in size with only small solid nodules present,  the largest of only 8 mm in diameter on the right.    ? Trochanteric bursitis of left hip   ? 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   ? Trochanteric bursitis, right hip 04/26/2020  ? Tubular adenoma of colon 01/28/2021  ? Colonoscopy screening 4 mm tubular Adenoma polyp Wilford Corner, MD [facility])  ? Tubular adenoma of colon 01/28/2021  ? Colonoscopy screening 4 mm tubular Adenoma polyp Wilford Corner, MD [facility])  ? Vasomotor symptoms due to menopause 04/19/2017  ? Vitamin D deficiency 05/08/2017  ? Vulvitis 07/22/2020  ? Yeast vaginitis 07/11/2019  ? ?Past Surgical History:  ?Procedure Laterality Date  ? Bladder dilitation    ? x 3  ? BREAST BIOPSY Right 2011  ? Benign histology  ? CARDIOVASCULAR STRESS TEST  2000  ? Unremarkable per pt report  ? CARPOMETACARPAL JOINT ARTHROTOMY Right 2011  ? COLONOSCOPY    ? COLONOSCOPY WITH PROPOFOL N/A 04/21/2015  ? Procedure: COLONOSCOPY WITH PROPOFOL;  Surgeon: Carol Ada, MD;  Location: [facility];  Service: Endoscopy;  Laterality: N/A;  ? EPIDURAL BLOCK INJECTION Left 04/12/2016  ? Left Medial Nerve Block and Left L5 ramus block, Dr Suella Broad   ? EPIDURAL BLOCK INJECTION  03/21/2016  ? Left L3-4 medial branch block and Left L5 & dorsal ramus block   ? EPIDURAL BLOCK INJECTION N/A 10/25/2016  ? Suella Broad, MD. Lumbar medial branch block  ? EPIDURAL BLOCK INJECTION N/A 02/09/2017  ? Suella Broad, MD.  Bilateral L3/4 medial branch block, bilateral L5 dorsal ramus block  ? EPIDURAL BLOCK INJECTION N/A 07/04/2017  ? Suella Broad, MD  ? FECAL TRANSPLANT  04/21/2015  ? Procedure: FECAL TRANSPLANT;  Surgeon: Carol Ada, MD;  Location: [facility];  Service: Endoscopy;;  ? HIP ARTHROPLASTY Left   ? HIP ARTHROSCOPY Left 03/06/2018  ? Left hip arthroplasty, redo for loose hip arthroplasty. Procedure at [facility] hospital  ? [place] Left 11/2015  ? for OA by Dr Suella Broad  ? INTERSTIM IMPLANT PLACEMENT   04/2021  ? OTHER SURGICAL HISTORY Left 2016  ? Left L3/L4 medial nerve block and Left L5 Dorsal Ramus block Dr Mickel Duhamel  ? TOTAL HIP ARTHROPLASTY Left 07/25/2016  ? Procedure: LEFT TOTAL HIP ARTHROPLASTY ANTERIOR APP

## 2021-12-07 ENCOUNTER — Ambulatory Visit: Payer: Medicare Other | Admitting: Rehabilitative and Restorative Service Providers"

## 2021-12-07 DIAGNOSIS — R42 Dizziness and giddiness: Secondary | ICD-10-CM

## 2021-12-07 DIAGNOSIS — M62838 Other muscle spasm: Secondary | ICD-10-CM | POA: Diagnosis not present

## 2021-12-07 DIAGNOSIS — H8112 Benign paroxysmal vertigo, left ear: Secondary | ICD-10-CM

## 2021-12-07 NOTE — Therapy (Signed)
[place] ?[facility] ?[address], [address] ?[place], [place], [address] ?Phone: [phone]   Fax:  [phone] ? ?Physical Therapy Treatment ? ?Patient Details  ?Name: Molly Giles ?MRN: [id number] ?Date of Birth: 06/10/1956 ?Referring Provider (PT): Debbora Presto, NP ? ? ?Encounter Date: 12/07/2021 ? ? PT End of Session - 12/07/21 1240   ? ? Visit Number 9   3/5 for vestibular services  ? Date for PT Re-Evaluation 01/03/22   ? Authorization Type Medicare   ? Progress Note Due on Visit 10   ? PT Start Time 1152   ? PT Stop Time 1230   ? PT Time Calculation (min) 38 min   ? Activity Tolerance Patient tolerated treatment well   ? Behavior During Therapy [facility] for tasks assessed/performed   ? ?  ?  ? ?  ? ? ?Past Medical History:  ?Diagnosis Date  ? Abdominal bloating 05/10/2021  ? Acute gastritis 11/19/2020  ? Allergic rhinoconjunctivitis 07/02/2015  ? Anal fissure 11/19/2020  ? Arthritis   ? Benign positional vertigo 04/2015  ? Responded well to Vestibular Rehab  ? Bruxism (teeth grinding)   ? Chronic contact dermatitis 08/06/2018  ? Per allergist Dr Pablo Lawrence.   ? Chronic migraine 02/25/2016  ? Per Dr Jaynee Eagles review in notes from [facility] from September 2015. Showed total headache days last month 18. Severe headache days 7 days. Moderate headache days 5 days. Mild headache days last month sick days. Days without headache last month 10 days. Symptoms associated with photophobia, phonophobia, osmophobia, neck pain, dizziness, jaw pain, nasal congestion, vision disturbances, tingling and numbness, weakness and worsening with activity. Each headache attack last 3 hours depending on treatment in severity. Left side, the right side, easier side, the frontal area in the back of the head. Characterized as throbbing, pressure, tightness, squeezing, stabbing and burning   ? Chronic migraine without aura 05/15/2013  ?  [facility]  ? Chronic  tonsillitis 01/27/2019  ? DDD (degenerative disc disease), cervical 11/18/2018  ? DDD (degenerative disc disease), lumbar   ? Depression   ? Disc displacement, lumbar   ? Dysrhythmia   ? seen by dr Wynonia Lawman- not a problem since she has been on Bystolic  ? Episodic cluster headache, not intractable 03/06/2017  ? Essential hypertension 05/15/2013  ? Family history of adverse reaction to anesthesia   ? Brother- N/V  ? Family history of premature CAD 05/15/2013  ? Fibromyalgia syndrome 07/01/2015  ? Management by Dr Chauncey Cruel. Devashwar ([facility])   ? GERD (gastroesophageal reflux disease) 12/16/2014  ? H/O seasonal allergies   ? Hammer toe   ? Left great toe  ? Hashimoto's thyroiditis   ? Per patient, diagnosed by Dr. Modena Nunnery  ? Hearing loss of both ears 07/27/2015  ? mild to borderline moderate low frequency hearing loss improving to within normal limits bilaterally on audiology testing at [facility] in November 2016.    ? History of Clostridium difficile colitis 07/01/2015  ? Required Fecal Transplantation tocure  ? History of colonic polyps   ? History of left hip replacement 09/20/2017  ? History of revision of total hip arthroplasty 04/10/2018  ? Hx of bad fall 02/2015  ? Severe Facial/head trauma without fracture  ? 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 Hyperlipidemia 1998  ? Hypokalemia due to excessive gastrointestinal loss of potassium 07/28/2019  ? Hypothyroidism   ? Impairment of balance 02/2015  ? Consequent of  postconcussive syndrome  ? Injury of triangular fibrocartilage complex of left wrist 02/25/2019  ? Dx 02/25/19 Iran Planas IV MD [facility])  ? Insulin resistance 07/04/2017  ? Internal hemorrhoid 01/28/2021  ? Internal hemorrhoid seen on colonoscopy 10/2020 Warnell Bureau MD [facility])  ? Interstitial cystitis   ? Irritable bowel syndrome with diarrhea 04/03/2016  ? Left ventricular hypertrophy, mild 02/25/2016  ? ECHOcardiogram report 06/17/15 showing EF55-60%, mild LVH and G1DD   ? Loose  total hip arthroplasty ([place]) 03/10/2018  ? [facility]  ? Lumbar facet joint pain   ? 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  ? Mood disorder ([place])   ? Morbid obesity ([place]) 03/06/2017  ? Musculoskeletal neck pain 07/14/2015  ? Nocturnal hypoxemia 03/06/2017  ? Normal coronary arteries 05/14/2014  ? Obesity (BMI 30.0-34.9) 01/29/2017  ? Osteoarthritis of left hip 11/01/2015  ? MRI order by Dr Alvan Dame (ortho) 10/2015 showed significant arthritis of left hip joint with cystic changes in femoral head c/w osteoarthritis  ? 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 Other insomnia 11/09/2016  ? Overweight   ? Pain in joint of left shoulder 11/09/2016  ? Pain in joint, multiple sites 11/18/2018  ? Palpitations 05/15/2013  ? [facility]  ? Periodic limb movement sleep disorder 03/28/2017  ? Perirectal cyst 05/07/2016  ? Poison ivy dermatitis 03/24/2021  ? PONV (postoperative nausea and vomiting)   ? Positive ANA (antinuclear antibody) 11/18/2018  ? Post concussion syndrome 06/06/2015  ? Post concussive syndrome 07/14/2015  ? Ms Deeg's post-concussive syndrome manifesting in vertigo and headache, mood changes, poor balance, dizziness, and decreased concentration per Dr Jaynee Eagles at [facility].   ? Posterior vitreous detachment of right eye 2014  ? Rectal abscess 05/10/2021  ? S/P left THA, AA 07/25/2016  ? Shingles   ? Shortness of breath dyspnea   ? with exertion  ? Sicca syndrome ([place]) 11/18/2018  ? (+) ANA  ? Sleep walking and eating 03/06/2017  ? Snoring 03/06/2017  ? Spondylosis of lumbar region without myelopathy or radiculopathy 10/30/2011  ? TFC (triangular fibrocartilage complex) injury 02/25/2019  ? Thyroid nodule 08/11/2009  ? Findings: The thyroid gland is within normal limits in size.  The gland is diffusely inhomogeneous. A small solid nodule is noted in the lower pole  medially on the right of 7 x 6 x 8 mm. A small solid nodule is noted  inferiorly on the left of 3 x 3 x 4 mm.  IMPRESSION:  The thyroid gland is within normal limits in size with only small solid nodules present, the largest of only 8 mm in diameter on the right.    ? Trochanteric bursitis of left hip   ? 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   ? Trochanteric bursitis, right hip 04/26/2020  ? Tubular adenoma of colon 01/28/2021  ? Colonoscopy screening 4 mm tubular Adenoma polyp Wilford Corner, MD [facility])  ? Tubular adenoma of colon 01/28/2021  ? Colonoscopy screening 4 mm tubular Adenoma polyp Wilford Corner, MD [facility])  ? Vasomotor symptoms due to menopause 04/19/2017  ? Vitamin D deficiency 05/08/2017  ? Vulvitis 07/22/2020  ? Yeast vaginitis 07/11/2019  ? ? ?Past Surgical History:  ?Procedure Laterality Date  ? Bladder dilitation    ? x 3  ? BREAST BIOPSY Right 2011  ? Benign histology  ? CARDIOVASCULAR STRESS TEST  2000  ? Unremarkable per pt report  ? CARPOMETACARPAL JOINT ARTHROTOMY Right  2011  ? COLONOSCOPY    ? COLONOSCOPY WITH PROPOFOL N/A 04/21/2015  ? Procedure: COLONOSCOPY WITH PROPOFOL;  Surgeon: Carol Ada, MD;  Location: [place];  Service: Endoscopy;  Laterality: N/A;  ? EPIDURAL BLOCK INJECTION Left 04/12/2016  ? Left Medial Nerve Block and Left L5 ramus block, Dr Suella Broad   ? EPIDURAL BLOCK INJECTION  03/21/2016  ? Left L3-4 medial branch block and Left L5 & dorsal ramus block   ? EPIDURAL BLOCK INJECTION N/A 10/25/2016  ? Suella Broad, MD. Lumbar medial branch block  ? EPIDURAL BLOCK INJECTION N/A 02/09/2017  ? Suella Broad, MD.  Bilateral L3/4 medial branch block, bilateral L5 dorsal ramus block  ? EPIDURAL BLOCK INJECTION N/A 07/04/2017  ? Suella Broad, MD  ? FECAL TRANSPLANT  04/21/2015  ? Procedure: FECAL TRANSPLANT;  Surgeon: Carol Ada, MD;  Location: [facility];  Service: Endoscopy;;  ? HIP ARTHROPLASTY Left   ? HIP ARTHROSCOPY Left 03/06/2018  ? Left hip arthroplasty, redo for loose hip arthroplasty.  Procedure at [facility] hospital  ? [place] Left 11/2015  ? for OA by Dr Suella Broad  ? INTERSTIM IMPLANT PLACEMENT  04/2021  ? OTHER SURGICAL HISTORY Left 2016  ? Left L3/L4 medial nerve block a

## 2021-12-07 NOTE — Patient Instructions (Addendum)
Access Code: [email or web address] ?URL: [email or web address] ?Date: 12/07/2021 ?Prepared by: Rudell Cobb ? ?Exercises ?- Brandt-Daroff Vestibular Exercise  - 2 x daily - 7 x weekly - 1 sets - 5 reps ?- Seated Gaze Stabilization with Head Rotation  - 2 x daily - 7 x weekly - 2 sets - 10 reps ?- Seated Gaze Stabilization with Head Nod  - 2 x daily - 7 x weekly - 2 sets - 10 reps ?- Standing Balance with Eyes Closed on Foam  - 2 x daily - 7 x weekly - 1 sets - 10 reps ?- Standing with Head Rotation  - 2 x daily - 7 x weekly - 1 sets - 10 reps ?- Single Leg Stance  - 2 x daily - 7 x weekly - 1 sets - 10 reps ?- Seated [place] Stretch with Towel  - 2 x daily - 7 x weekly - 1 sets - 3 reps - 20 seconds hold ?- Supine Suboccipital Release with Tennis Balls  - 1 x daily - 7 x weekly - 1 sets - 1 reps - 60 seconds hold ?- Self-Epley Maneuver Right Ear  - 1 x daily - 7 x weekly - 1 sets - 1 reps ?

## 2021-12-08 ENCOUNTER — Other Ambulatory Visit: Payer: Self-pay | Admitting: Rheumatology

## 2021-12-08 DIAGNOSIS — M797 Fibromyalgia: Secondary | ICD-10-CM

## 2021-12-08 NOTE — Telephone Encounter (Signed)
Next Visit: 12/20/2021 ?  ?Last Visit: 06/21/2021 ?  ?Last Fill: 11/08/2021 ?  ?Dx: Other insomnia ?  ?Current Dose per office note on 06/21/2021: Ambien 5 mg 1 tablet by mouth at bedtime for insomnia ?  ?Okay to refill Ambien? ?

## 2021-12-09 ENCOUNTER — Ambulatory Visit
Admission: RE | Admit: 2021-12-09 | Discharge: 2021-12-09 | Disposition: A | Discharge status: Home / Self Care | Payer: Medicare Other | Source: Ambulatory Visit | Attending: Student | Admitting: Student

## 2021-12-09 DIAGNOSIS — M5412 Radiculopathy, cervical region: Secondary | ICD-10-CM

## 2021-12-15 ENCOUNTER — Ambulatory Visit: Payer: Medicare Other | Admitting: Rehabilitative and Restorative Service Providers"

## 2021-12-15 ENCOUNTER — Encounter: Payer: Self-pay | Admitting: Family Medicine

## 2021-12-15 ENCOUNTER — Ambulatory Visit: Payer: Medicare Other | Attending: Family Medicine

## 2021-12-15 DIAGNOSIS — M6281 Muscle weakness (generalized): Secondary | ICD-10-CM | POA: Diagnosis present

## 2021-12-15 DIAGNOSIS — R278 Other lack of coordination: Secondary | ICD-10-CM | POA: Diagnosis present

## 2021-12-15 DIAGNOSIS — H8112 Benign paroxysmal vertigo, left ear: Secondary | ICD-10-CM | POA: Insufficient documentation

## 2021-12-15 DIAGNOSIS — R42 Dizziness and giddiness: Secondary | ICD-10-CM

## 2021-12-15 DIAGNOSIS — M62838 Other muscle spasm: Secondary | ICD-10-CM | POA: Diagnosis present

## 2021-12-15 NOTE — Therapy (Signed)
?OUTPATIENT PHYSICAL THERAPY TREATMENT NOTE ? ? ?Patient Name: Molly Giles ?MRN: [id number] ?DOB:12-Apr-1956, 66 y.o., female ?Today's Date: 12/15/2021 ? ?PCP: McDiarmid, Blane Ohara, MD ?REFERRING PROVIDER: McDiarmid, Blane Ohara, MD ? ? PT End of Session - 12/15/21 1449   ? ? Visit Number 11   ? Date for PT Re-Evaluation 02/23/22   ? Authorization Type Medicare   ? PT Start Time 0300   ? PT Stop Time 1529   ? PT Time Calculation (min) 43 min   ? Activity Tolerance Patient tolerated treatment well   ? Behavior During Therapy [facility] for tasks assessed/performed   ? ?  ?  ? ?  ? ? ?Past Medical History:  ?Diagnosis Date  ? Abdominal bloating 05/10/2021  ? Acute gastritis 11/19/2020  ? Allergic rhinoconjunctivitis 07/02/2015  ? Anal fissure 11/19/2020  ? Arthritis   ? Benign positional vertigo 04/2015  ? Responded well to Vestibular Rehab  ? Bruxism (teeth grinding)   ? Chronic contact dermatitis 08/06/2018  ? Per allergist Dr Pablo Lawrence.   ? Chronic migraine 02/25/2016  ? Per Dr Jaynee Eagles review in notes from [facility] from September 2015. Showed total headache days last month 18. Severe headache days 7 days. Moderate headache days 5 days. Mild headache days last month sick days. Days without headache last month 10 days. Symptoms associated with photophobia, phonophobia, osmophobia, neck pain, dizziness, jaw pain, nasal congestion, vision disturbances, tingling and numbness, weakness and worsening with activity. Each headache attack last 3 hours depending on treatment in severity. Left side, the right side, easier side, the frontal area in the back of the head. Characterized as throbbing, pressure, tightness, squeezing, stabbing and burning   ? Chronic migraine without aura 05/15/2013  ?  [facility]  ? Chronic tonsillitis 01/27/2019  ? DDD (degenerative disc disease), cervical 11/18/2018  ? DDD (degenerative disc disease), lumbar   ? Depression   ? Disc displacement, lumbar   ?  Dysrhythmia   ? seen by dr Wynonia Lawman- not a problem since she has been on Bystolic  ? Episodic cluster headache, not intractable 03/06/2017  ? Essential hypertension 05/15/2013  ? Family history of adverse reaction to anesthesia   ? Brother- N/V  ? Family history of premature CAD 05/15/2013  ? Fibromyalgia syndrome 07/01/2015  ? Management by Dr Chauncey Cruel. Devashwar (Rheum)   ? GERD (gastroesophageal reflux disease) 12/16/2014  ? H/O seasonal allergies   ? Hammer toe   ? Left great toe  ? Hashimoto's thyroiditis   ? Per patient, diagnosed by Dr. Modena Nunnery  ? Hearing loss of both ears 07/27/2015  ? mild to borderline moderate low frequency hearing loss improving to within normal limits bilaterally on audiology testing at [facility] in November 2016.    ? History of Clostridium difficile colitis 07/01/2015  ? Required Fecal Transplantation tocure  ? History of colonic polyps   ? History of left hip replacement 09/20/2017  ? History of revision of total hip arthroplasty 04/10/2018  ? Hx of bad fall 02/2015  ? Severe Facial/head trauma without fracture  ? 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 Hyperlipidemia 1998  ? Hypokalemia due to excessive gastrointestinal loss of potassium 07/28/2019  ? Hypothyroidism   ? Impairment of balance 02/2015  ? Consequent of postconcussive syndrome  ? Injury of triangular fibrocartilage complex of left wrist 02/25/2019  ? Dx 02/25/19 Iran Planas IV MD [facility])  ? Insulin resistance 07/04/2017  ? Internal hemorrhoid 01/28/2021  ?  Internal hemorrhoid seen on colonoscopy 10/2020 Warnell Bureau MD [facility])  ? Interstitial cystitis   ? Irritable bowel syndrome with diarrhea 04/03/2016  ? Left ventricular hypertrophy, mild 02/25/2016  ? ECHOcardiogram report 06/17/15 showing EF55-60%, mild LVH and G1DD   ? 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[place]) 03/10/2018  ? [facility]  ? Lumbar facet joint pain   ? 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  ? Mood disorder ([place])   ? Morbid obesity  ([place]) 03/06/2017  ? Musculoskeletal neck pain 07/14/2015  ? Nocturnal hypoxemia 03/06/2017  ? Normal coronary arteries 05/14/2014  ? Obesity (BMI 30.0-34.9) 01/29/2017  ? Osteoarthritis of left hip 11/01/2015  ? MRI order by Dr Alvan Dame (ortho) 10/2015 showed significant arthritis of left hip joint with cystic changes in femoral head c/w osteoarthritis  ? 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 Other insomnia 11/09/2016  ? Overweight   ? Pain in joint of left shoulder 11/09/2016  ? Pain in joint, multiple sites 11/18/2018  ? Palpitations 05/15/2013  ? [facility] manages  ? Periodic limb movement sleep disorder 03/28/2017  ? Perirectal cyst 05/07/2016  ? Poison ivy dermatitis 03/24/2021  ? PONV (postoperative nausea and vomiting)   ? Positive ANA (antinuclear antibody) 11/18/2018  ? Post concussion syndrome 06/06/2015  ? Post concussive syndrome 07/14/2015  ? Ms Siddiqi's post-concussive syndrome manifesting in vertigo and headache, mood changes, poor balance, dizziness, and decreased concentration per Dr Jaynee Eagles at [facility].   ? Posterior vitreous detachment of right eye 2014  ? Rectal abscess 05/10/2021  ? S/P left THA, AA 07/25/2016  ? Shingles   ? Shortness of breath dyspnea   ? with exertion  ? Sicca syndrome ([place]) 11/18/2018  ? (+) ANA  ? Sleep walking and eating 03/06/2017  ? Snoring 03/06/2017  ? Spondylosis of lumbar region without myelopathy or radiculopathy 10/30/2011  ? TFC (triangular fibrocartilage complex) injury 02/25/2019  ? Thyroid nodule 08/11/2009  ? Findings: The thyroid gland is within normal limits in size.  The gland is diffusely inhomogeneous. A small solid nodule is noted in the lower pole  medially on the right of 7 x 6 x 8 mm. A small solid nodule is noted inferiorly on the left of 3 x 3 x 4 mm.  IMPRESSION:  The thyroid gland is within normal limits in size with only small solid nodules present, the largest of only 8 mm in diameter  on the right.    ? Trochanteric bursitis of left hip   ? 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   ? Trochanteric bursitis, right hip 04/26/2020  ? Tubular adenoma of colon 01/28/2021  ? Colonoscopy screening 4 mm tubular Adenoma polyp Wilford Corner, MD [facility])  ? Tubular adenoma of colon 01/28/2021  ? Colonoscopy screening 4 mm tubular Adenoma polyp Wilford Corner, MD [facility])  ? Vasomotor symptoms due to menopause 04/19/2017  ? Vitamin D deficiency 05/08/2017  ? Vulvitis 07/22/2020  ? Yeast vaginitis 07/11/2019  ? ?Past Surgical History:  ?Procedure Laterality Date  ? Bladder dilitation    ? x 3  ? BREAST BIOPSY Right 2011  ? Benign histology  ? CARDIOVASCULAR STRESS TEST  2000  ? Unremarkable per pt report  ? CARPOMETACARPAL JOINT ARTHROTOMY Right 2011  ? COLONOSCOPY    ? COLONOSCOPY WITH PROPOFOL N/A 04/21/2015  ? Procedure: COLONOSCOPY WITH PROPOFOL;  Surgeon: Carol Ada, MD;  Location: [place];  Service: Endoscopy;  Laterality: N/A;  ?  CYSTOSCOPY W/ DILATION OF BLADDER N/A   ? EPIDURAL BLOCK INJECTION Left 04/12/2016  ? Left Medial Nerve Block and Left L5 ramus block, Dr Suella Broad   ? EPIDURAL BLOCK INJECTION  03/21/2016  ? Left L3-4 medial branch block and Left L5 & dorsal ramus block   ? EPIDURAL BLOCK INJECTION N/A 10/25/2016  ? Suella Broad, MD. Lumbar medial branch block  ? EPIDURAL BLOCK INJECTION N/A 02/09/2017  ? Suella Broad, MD.  Bilateral L3/4 medial branch block, bilateral L5 dorsal ramus block  ? EPIDURAL BLOCK INJECTION N/A 07/04/2017  ? Suella Broad, MD  ? FECAL TRANSPLANT  04/21/2015  ? Procedure: FECAL TRANSPLANT;  Surgeon: Carol Ada, MD;  Location: [place];  Service: Endoscopy;;  ? HIP ARTHROPLASTY Left   ? HIP ARTHROSCOPY Left 03/06/2018  ? Left hip arthroplasty, redo for loose hip arthroplasty. Procedure at [facility] hospital  ? [place] Left 11/2015  ? for OA by Dr Suella Broad  ? INTERSTIM IMPLANT PLACEMENT   04/2021  ? Pia Mau MD ([facility])  ? INTERSTIM IMPLANT REVISION N/A 09/2021  ? Pia Mau MD ([facility])  ? OTHER SURGICAL HISTORY Left 2016  ? Left L3/L4 medial nerve block and L

## 2021-12-15 NOTE — Patient Instructions (Signed)
Access Code: [email or web address] ?URL: [email or web address] ?Date: 12/15/2021 ?Prepared by: Rudell Cobb ? ?Exercises ?- Self-Epley Maneuver Right Ear  - 1 x daily - 7 x weekly - 1 sets - 1 reps ?- Brandt-Daroff Vestibular Exercise  - 2 x daily - 7 x weekly - 1 sets - 5 reps ?- Seated Gaze Stabilization with Head Rotation  - 2 x daily - 7 x weekly - 2 sets - 10 reps ?- Seated Gaze Stabilization with Head Nod  - 2 x daily - 7 x weekly - 2 sets - 10 reps ?- Standing Balance with Eyes Closed on Foam  - 2 x daily - 7 x weekly - 1 sets - 10 reps ?- Single Leg Stance  - 2 x daily - 7 x weekly - 1 sets - 10 reps ?- Seated [place]ene Stretch with Towel  - 2 x daily - 7 x weekly - 1 sets - 3 reps - 20 seconds hold ?- Supine Suboccipital Release with Tennis Balls  - 1 x daily - 7 x weekly - 1 sets - 1 reps - 60 seconds hold ?

## 2021-12-15 NOTE — Progress Notes (Addendum)
Summary ov note B. Louis Meckel MD ([facility]) 12/12/21 ? ?Second opinion about InterStim battery placement ? ?Battery pack well positioned.  Suspects patient had postop seroma.  No evidence infection.  Patient reassured.  ? ?Discussed chronic pelvic pain and voiding dysfunction.   ?Dr Mosie Epstein prescribed intravaginal Valium 10 mg tablet, 1 tablet per vagina at bedtime #30 tablets with two refills. ?

## 2021-12-15 NOTE — Therapy (Signed)
[facility] ?[address], [address] ?[place], [place], [address] ?Phone: [phone]   Fax:  [phone] ? ?Physical Therapy Treatment and Discharge Summary ? ?Patient Details  ?Name: Molly Giles ?MRN: [id number] ?Date of Birth: 1956-01-03 ?Referring Provider (PT): Debbora Presto, NP ? ?PHYSICAL THERAPY DISCHARGE SUMMARY ? ?Visits from Start of Care: 4 ? ?Current functional level related to goals / functional outcomes: ?See goals below ?  ?Remaining deficits: ?Met LTGs ?  ?Education / Equipment: ?Has HEP for self mgmt and for balance.  ? ?Patient agrees to discharge. Patient goals were met. Patient is being discharged due to meeting the stated rehab goals. ?10 ?Encounter Date: 12/15/2021 ? ? PT End of Session - 12/15/21 1434   ? ? Visit Number 10   4/5 for vestibular  ? Date for PT Re-Evaluation 01/03/22   ? Authorization Type Medicare   ? Progress Note Due on Visit 10   ? PT Start Time 1400   ? PT Stop Time 6962   ? PT Time Calculation (min) 32 min   ? Activity Tolerance Patient tolerated treatment well   ? Behavior During Therapy [facility] for tasks assessed/performed   ? ?  ?  ? ?  ? ? ?Past Medical History:  ?Diagnosis Date  ? Abdominal bloating 05/10/2021  ? Acute gastritis 11/19/2020  ? Allergic rhinoconjunctivitis 07/02/2015  ? Anal fissure 11/19/2020  ? Arthritis   ? Benign positional vertigo 04/2015  ? Responded well to Vestibular Rehab  ? Bruxism (teeth grinding)   ? Chronic contact dermatitis 08/06/2018  ? Per allergist Dr Pablo Lawrence.   ? Chronic migraine 02/25/2016  ? Per Dr Jaynee Eagles review in notes from [facility] from September 2015. Showed total headache days last month 18. Severe headache days 7 days. Moderate headache days 5 days. Mild headache days last month sick days. Days without headache last month 10 days. Symptoms associated with photophobia, phonophobia, osmophobia, neck pain, dizziness, jaw pain, nasal congestion, vision disturbances,  tingling and numbness, weakness and worsening with activity. Each headache attack last 3 hours depending on treatment in severity. Left side, the right side, easier side, the frontal area in the back of the head. Characterized as throbbing, pressure, tightness, squeezing, stabbing and burning   ? Chronic migraine without aura 05/15/2013  ?  [facility]  ? Chronic tonsillitis 01/27/2019  ? DDD (degenerative disc disease), cervical 11/18/2018  ? DDD (degenerative disc disease), lumbar   ? Depression   ? Disc displacement, lumbar   ? Dysrhythmia   ? seen by dr Wynonia Lawman- not a problem since she has been on Bystolic  ? Episodic cluster headache, not intractable 03/06/2017  ? Essential hypertension 05/15/2013  ? Family history of adverse reaction to anesthesia   ? Brother- N/V  ? Family history of premature CAD 05/15/2013  ? Fibromyalgia syndrome 07/01/2015  ? Management by Dr Chauncey Cruel. Devashwar ([facility])   ? GERD (gastroesophageal reflux disease) 12/16/2014  ? H/O seasonal allergies   ? Hammer toe   ? Left great toe  ? Hashimoto's thyroiditis   ? Per patient, diagnosed by Dr. Modena Nunnery  ? Hearing loss of both ears 07/27/2015  ? mild to borderline moderate low frequency hearing loss improving to within normal limits bilaterally on audiology testing at [facility] in November 2016.    ? History of Clostridium difficile colitis 07/01/2015  ? Required Fecal Transplantation tocure  ? History of colonic polyps   ?  History of left hip replacement 09/20/2017  ? History of revision of total hip arthroplasty 04/10/2018  ? Hx of bad fall 02/2015  ? Severe Facial/head trauma without fracture  ? 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 Hyperlipidemia 1998  ? Hypokalemia due to excessive gastrointestinal loss of potassium 07/28/2019  ? Hypothyroidism   ? Impairment of balance 02/2015  ? Consequent of postconcussive syndrome  ? Injury of triangular fibrocartilage complex of left wrist 02/25/2019  ? Dx  02/25/19 Iran Planas IV MD [facility])  ? Insulin resistance 07/04/2017  ? Internal hemorrhoid 01/28/2021  ? Internal hemorrhoid seen on colonoscopy 10/2020 Warnell Bureau MD [facility])  ? Interstitial cystitis   ? Irritable bowel syndrome with diarrhea 04/03/2016  ? Left ventricular hypertrophy, mild 02/25/2016  ? ECHOcardiogram report 06/17/15 showing EF55-60%, mild LVH and G1DD   ? 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[place]) 03/10/2018  ? [facility]  ? Lumbar facet joint pain   ? 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  ? Mood disorder ([place])   ? Morbid obesity ([place]) 03/06/2017  ? Musculoskeletal neck pain 07/14/2015  ? Nocturnal hypoxemia 03/06/2017  ? Normal coronary arteries 05/14/2014  ? Obesity (BMI 30.0-34.9) 01/29/2017  ? Osteoarthritis of left hip 11/01/2015  ? MRI order by Dr Alvan Dame (ortho) 10/2015 showed significant arthritis of left hip joint with cystic changes in femoral head c/w osteoarthritis  ? 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 Other insomnia 11/09/2016  ? Overweight   ? Pain in joint of left shoulder 11/09/2016  ? Pain in joint, multiple sites 11/18/2018  ? Palpitations 05/15/2013  ? [facility] manages  ? Periodic limb movement sleep disorder 03/28/2017  ? Perirectal cyst 05/07/2016  ? Poison ivy dermatitis 03/24/2021  ? PONV (postoperative nausea and vomiting)   ? Positive ANA (antinuclear antibody) 11/18/2018  ? Post concussion syndrome 06/06/2015  ? Post concussive syndrome 07/14/2015  ? Ms Bischof's post-concussive syndrome manifesting in vertigo and headache, mood changes, poor balance, dizziness, and decreased concentration per Dr Jaynee Eagles at [facility].   ? Posterior vitreous detachment of right eye 2014  ? Rectal abscess 05/10/2021  ? S/P left THA, AA 07/25/2016  ? Shingles   ? Shortness of breath dyspnea   ? with exertion  ? Sicca syndrome ([employer]) 11/18/2018  ? (+) ANA  ? Sleep walking and eating 03/06/2017  ? Snoring 03/06/2017  ?  Spondylosis of lumbar region without myelopathy or radiculopathy 10/30/2011  ? TFC (triangular fibrocartilage complex) injury 02/25/2019  ? Thyroid nodule 08/11/2009  ? Findings: The thyroid gland is within normal limits in size.  The gland is diffusely inhomogeneous. A small solid nodule is noted in the lower pole  medially on the right of 7 x 6 x 8 mm. A small solid nodule is noted inferiorly on the left of 3 x 3 x 4 mm.  IMPRESSION:  The thyroid gland is within normal limits in size with only small solid nodules present, the largest of only 8 mm in diameter on the right.    ? Trochanteric bursitis of left hip   ? 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   ? Trochanteric bursitis, right hip 04/26/2020  ? Tubular adenoma of colon 01/28/2021  ? Colonoscopy screening 4 mm tubular Adenoma polyp Wilford Corner, MD [facility])  ? Tubular adenoma of colon 01/28/2021  ? Colonoscopy screening 4 mm tubular Adenoma polyp Wilford Corner, MD [facility])  ? Vasomotor symptoms due to menopause  04/19/2017  ? Vitamin D deficiency 05/08/2017  ? Vulvitis 07/22/2020  ? Yeast vaginitis 07/11/2019  ? ? ?Past Surgical History:  ?Procedure Laterality Date  ? Bladder dilitation    ? x 3  ? BREAST BIOPSY Right 2011  ? Benign histology  ? CARDIOVASCULAR STRESS TEST  2000  ? Unremarkable per pt report  ? CARPOMETACARPAL JOINT ARTHROTOMY Right 2011  ? COLONOSCOPY    ? COLONOSCOPY WITH PROPOFOL N/A 04/21/2015  ? Procedure: COLONOSCOPY WITH PROPOFOL;  Surgeon: Carol Ada, MD;  Location: [place];  Service: Endoscopy;  Laterality: N/A;  ? CYSTOSCOPY W/ DILATION OF BLADDER N/A   ? EPIDURAL BLOCK INJECTION Left 04/12/2016  ? Left Medial Nerve Block and Left L5 ramus block, Dr Suella Broad   ? EPIDURAL BLOCK INJECTION  03/21/2016  ? Left L3-4 medial branch block and Left L5 & dorsal ramus block   ? EPIDURAL BLOCK INJECTION N/A 10/25/2016  ? Suella Broad, MD. Lumbar medial branch block  ? EPIDURAL BLOCK INJECTION N/A  02/09/2017  ? Suella Broad, MD.  Bilateral L3/4 medial branch block, bilateral L5 dorsal ramus block  ? EPIDURAL BLOCK INJECTION N/A 07/04/2017  ? Suella Broad, MD  ? FECAL TRANSPLANT  04/21/2015  ? Procedure

## 2021-12-15 NOTE — Addendum Note (Signed)
Addended byWendy Poet, Dez Stauffer D on: 12/15/2021 04:29 PM ? ? Modules accepted: Orders ? ?

## 2021-12-20 ENCOUNTER — Ambulatory Visit: Payer: BLUE CROSS/BLUE SHIELD | Admitting: Physician Assistant

## 2021-12-20 ENCOUNTER — Encounter: Payer: Self-pay | Admitting: Family Medicine

## 2021-12-20 ENCOUNTER — Other Ambulatory Visit: Payer: Self-pay | Admitting: Family Medicine

## 2021-12-20 DIAGNOSIS — E038 Other specified hypothyroidism: Secondary | ICD-10-CM

## 2021-12-20 DIAGNOSIS — Z8619 Personal history of other infectious and parasitic diseases: Secondary | ICD-10-CM

## 2021-12-20 DIAGNOSIS — R1310 Dysphagia, unspecified: Secondary | ICD-10-CM

## 2021-12-20 DIAGNOSIS — Z8659 Personal history of other mental and behavioral disorders: Secondary | ICD-10-CM

## 2021-12-20 DIAGNOSIS — N3010 Interstitial cystitis (chronic) without hematuria: Secondary | ICD-10-CM

## 2021-12-20 DIAGNOSIS — E782 Mixed hyperlipidemia: Secondary | ICD-10-CM | POA: Insufficient documentation

## 2021-12-20 DIAGNOSIS — B3789 Other sites of candidiasis: Secondary | ICD-10-CM

## 2021-12-20 DIAGNOSIS — M797 Fibromyalgia: Secondary | ICD-10-CM

## 2021-12-20 DIAGNOSIS — Z8669 Personal history of other diseases of the nervous system and sense organs: Secondary | ICD-10-CM

## 2021-12-20 DIAGNOSIS — Z96642 Presence of left artificial hip joint: Secondary | ICD-10-CM

## 2021-12-20 DIAGNOSIS — G894 Chronic pain syndrome: Secondary | ICD-10-CM

## 2021-12-20 DIAGNOSIS — M5030 Other cervical disc degeneration, unspecified cervical region: Secondary | ICD-10-CM

## 2021-12-20 DIAGNOSIS — M5136 Other intervertebral disc degeneration, lumbar region: Secondary | ICD-10-CM

## 2021-12-20 DIAGNOSIS — M19041 Primary osteoarthritis, right hand: Secondary | ICD-10-CM

## 2021-12-20 DIAGNOSIS — M7062 Trochanteric bursitis, left hip: Secondary | ICD-10-CM

## 2021-12-20 DIAGNOSIS — E28319 Asymptomatic premature menopause: Secondary | ICD-10-CM

## 2021-12-20 DIAGNOSIS — R5383 Other fatigue: Secondary | ICD-10-CM

## 2021-12-20 DIAGNOSIS — E559 Vitamin D deficiency, unspecified: Secondary | ICD-10-CM

## 2021-12-20 DIAGNOSIS — Z8639 Personal history of other endocrine, nutritional and metabolic disease: Secondary | ICD-10-CM

## 2021-12-20 DIAGNOSIS — M19049 Primary osteoarthritis, unspecified hand: Secondary | ICD-10-CM | POA: Insufficient documentation

## 2021-12-20 DIAGNOSIS — G4709 Other insomnia: Secondary | ICD-10-CM

## 2021-12-20 DIAGNOSIS — Z8679 Personal history of other diseases of the circulatory system: Secondary | ICD-10-CM

## 2021-12-20 HISTORY — DX: Asymptomatic premature menopause: E28.319

## 2021-12-20 HISTORY — DX: Personal history of other infectious and parasitic diseases: Z86.19

## 2021-12-20 HISTORY — DX: Other sites of candidiasis: B37.89

## 2021-12-20 HISTORY — DX: Dysphagia, unspecified: R13.10

## 2021-12-21 NOTE — Progress Notes (Signed)
? ?Office Visit Note ? ?Patient: Molly Giles             ?Date of Birth: 25-Mar-1956           ?MRN: [id number]             ?PCP: McDiarmid, Blane Ohara, MD ?Referring: McDiarmid, Blane Ohara, MD ?Visit Date: 12/27/2021 ?Occupation: '@GUAROCC'$ @ ? ?Subjective:  ?Insomnia  ? ?History of Present Illness: Molly Giles is a 66 y.o. female with history of fibromyalgia, osteoarthritis, and DDD.  Patient continues to experience intermittent myalgias and muscle tenderness due to fibromyalgia.  She experiences occasional muscle spasms and takes methocarbamol 500 mg as needed for muscle spasms and tramadol 100 mg as needed for pain relief.  She has been experiencing increased pain in both shoulder joints and has noted nocturnal pain at times.  She has an upcoming appointment with Dr. Onnie Graham next week for further evaluation and management.  She states she is also been experiencing increased pain in both hands intermittently.  She denies any joint swelling at this time.  She states that the pain is typically between her MCP joints in both hands.  She has started to take meloxicam 15 mg on a daily basis for management of interstitial cystitis as recommended by her urologist. ?She continues to have difficulty falling asleep at night despite taking Ambien 5 mg at bedtime.  Occasionally she will wake up in the middle the night and takes melatonin to help her sleep through the rest of the night. ? ? ?Activities of Daily Living:  ?Patient reports morning stiffness for 30 minutes.   ?Patient Reports nocturnal pain.  ?Difficulty dressing/grooming: Denies ?Difficulty climbing stairs: Reports ?Difficulty getting out of chair: Denies ?Difficulty using hands for taps, buttons, cutlery, and/or writing: Denies ? ?Review of Systems  ?Constitutional:  Positive for fatigue.  ?HENT:  Positive for mouth sores, mouth dryness and nose dryness.   ?Eyes:  Positive for pain, itching and dryness.  ?Respiratory:  Negative for shortness of breath and  difficulty breathing.   ?Cardiovascular:  Negative for chest pain and palpitations.  ?Gastrointestinal:  Positive for diarrhea. Negative for blood in stool and constipation.  ?Endocrine: Negative for increased urination.  ?Genitourinary:  Negative for difficulty urinating.  ?Musculoskeletal:  Positive for joint pain, joint pain, joint swelling, myalgias, morning stiffness, muscle tenderness and myalgias.  ?Skin:  Negative for color change, rash and redness.  ?Allergic/Immunologic: Negative for susceptible to infections.  ?Neurological:  Positive for headaches, memory loss and weakness. Negative for dizziness and numbness.  ?Hematological:  Positive for bruising/bleeding tendency.  ?Psychiatric/Behavioral:  Negative for confusion.   ? ?PMFS History:  ?Patient Active Problem List  ? Diagnosis Date Noted  ? Arthrosis of hand 12/20/2021  ? History of Clostridioides difficile colitis, required fecal transplantation 12/20/2021  ? Mixed hyperlipidemia 12/20/2021  ? Odynophagia 12/20/2021  ? Perianal candidiasis 12/20/2021  ? Premature menopause 12/20/2021  ? Meibomian gland dysfunction (MGD) of both eyes 10/17/2021  ? Nuclear sclerosis of both eyes 10/17/2021  ? Epiretinal membrane (ERM) of left eye 10/17/2021  ? Dysphagia 10/11/2021  ? Sacroiliac inflammation ([place]) 08/18/2021  ? OAB (overactive bladder) 03/02/2021  ? Anterior to posterior tear of superior glenoid labrum of left shoulder 02/22/2021  ? Osteoarthritis of acromioclavicular joint 02/22/2021  ? Acute gastritis 11/19/2020  ? Anal fissure 11/19/2020  ? 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 Chronic low back pain 05/14/2019  ? Cervical spondylosis 11/18/2018  ? DDD (degenerative disc disease), lumbar 11/18/2018  ?  Keratoconjunctivitis sicca ([place]) 11/18/2018  ? Chronic contact dermatitis 08/06/2018  ? Primary osteoarthritis of left knee 06/20/2018  ? 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[place]) 11/29/2017  ? Vitamin D deficiency 05/08/2017  ? Periodic limb movement  sleep disorder 03/28/2017  ? Other insomnia 11/09/2016  ? Abdominal discomfort, generalized 05/07/2016  ? Irritable bowel syndrome with diarrhea 04/03/2016  ? Left ventricular hypertrophy, mild 02/25/2016  ? Mood disorder ([place]) 07/02/2015  ? Allergic rhinoconjunctivitis 07/02/2015  ? Fibromyalgia syndrome 07/01/2015  ? Personal history of colonic polyps 07/01/2015  ? GERD (gastroesophageal reflux disease) 12/16/2014  ? Hypothyroidism 05/15/2013  ? Pure hypercholesterolemia 05/15/2013  ? Interstitial cystitis 05/15/2013  ? Chronic migraine without aura 05/15/2013  ?  ?Past Medical History:  ?Diagnosis Date  ? Abdominal bloating 05/10/2021  ? Acute gastritis 11/19/2020  ? Allergic rhinoconjunctivitis 07/02/2015  ? Anal fissure 11/19/2020  ? Arthritis   ? Benign positional vertigo 04/2015  ? Responded well to [facility]  ? Bruxism (teeth grinding)   ? Chronic contact dermatitis 08/06/2018  ? Per allergist Dr Pablo Lawrence.   ? Chronic migraine 02/25/2016  ? Per Dr Jaynee Eagles review in notes from [facility] from September 2015. Showed total headache days last month 18. Severe headache days 7 days. Moderate headache days 5 days. Mild headache days last month sick days. Days without headache last month 10 days. Symptoms associated with photophobia, phonophobia, osmophobia, neck pain, dizziness, jaw pain, nasal congestion, vision disturbances, tingling and numbness, weakness and worsening with activity. Each headache attack last 3 hours depending on treatment in severity. Left side, the right side, easier side, the frontal area in the back of the head. Characterized as throbbing, pressure, tightness, squeezing, stabbing and burning   ? Chronic migraine without aura 05/15/2013  ?  [facility]  ? Chronic tonsillitis 01/27/2019  ? DDD (degenerative disc disease), cervical 11/18/2018  ? DDD (degenerative disc disease), lumbar   ? Depression   ? Disc displacement, lumbar   ?  Dysrhythmia   ? seen by dr Wynonia Lawman- not a problem since she has been on Bystolic  ? Episodic cluster headache, not intractable 03/06/2017  ? Essential hypertension 05/15/2013  ? Family history of adverse reaction to anesthesia   ? Brother- N/V  ? Family history of premature CAD 05/15/2013  ? Fibromyalgia syndrome 07/01/2015  ? Management by Dr Chauncey Cruel. Devashwar ([facility])   ? GERD (gastroesophageal reflux disease) 12/16/2014  ? H/O seasonal allergies   ? Hammer toe   ? Left great toe  ? Hashimoto's thyroiditis   ? Per patient, diagnosed by Dr. Modena Nunnery  ? Hearing loss of both ears 07/27/2015  ? mild to borderline moderate low frequency hearing loss improving to within normal limits bilaterally on audiology testing at [facility] in November 2016.    ? History of Clostridium difficile colitis 07/01/2015  ? Required Fecal Transplantation tocure  ? History of colonic polyps   ? History of left hip replacement 09/20/2017  ? History of revision of total hip arthroplasty 04/10/2018  ? Hx of bad fall 02/2015  ? Severe Facial/head trauma without fracture  ? 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 Hyperlipidemia 1998  ? Hypokalemia due to excessive gastrointestinal loss of potassium 07/28/2019  ? Hypothyroidism   ? Impairment of balance 02/2015  ? Consequent of postconcussive syndrome  ? Injury of triangular fibrocartilage complex of left wrist 02/25/2019  ? Dx 02/25/19 Iran Planas IV MD [facility])  ? Insulin resistance 07/04/2017  ?  Internal hemorrhoid 01/28/2021  ? Internal hemorrhoid seen on colonoscopy 10/2020 Warnell Bureau MD [facility])  ? Interstitial cystitis   ? Irritable bowel syndrome with diarrhea 04/03/2016  ? Left ventricular hypertrophy, mild 02/25/2016  ? ECHOcardiogram report 06/17/15 showing EF55-60%, mild LVH and G1DD   ? 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[place]) 03/10/2018  ? [facility]  ? Lumbar facet joint pain   ? 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  ? Mood disorder ([place])   ? Morbid obesity  ([place]) 03/06/2017  ? Musculoskeletal neck pain 07/14/2015  ? Nocturnal hypoxemia 03/06/2017  ? Normal coronary arteries 05/14/2014  ? Obesity (BMI 30.0-34.9) 01/29/2017  ? Osteoarthritis of left hip 11/01/2015  ? MRI [place]

## 2021-12-22 ENCOUNTER — Ambulatory Visit: Payer: Medicare Other

## 2021-12-22 DIAGNOSIS — R278 Other lack of coordination: Secondary | ICD-10-CM

## 2021-12-22 DIAGNOSIS — M62838 Other muscle spasm: Secondary | ICD-10-CM | POA: Diagnosis not present

## 2021-12-22 DIAGNOSIS — M6281 Muscle weakness (generalized): Secondary | ICD-10-CM

## 2021-12-22 NOTE — Therapy (Signed)
?OUTPATIENT PHYSICAL THERAPY TREATMENT NOTE ? ? ?Patient Name: Tamani Durney [place] ?MRN: [id number] ?DOB:[birth year], 66 y.o., female ?Today's Date: 12/22/2021 ? ?PCP: McDiarmid, Blane Ohara, MD ?REFERRING PROVIDER: Debbora Presto, NP ? ? PT End of Session - 12/22/21 1449   ? ? Visit Number 12   ? Date for PT Re-Evaluation 02/23/22   ? Authorization Type Medicare   ? Progress Note Due on Visit 10   ? PT Start Time 1448   ? PT Stop Time 1526   ? PT Time Calculation (min) 38 min   ? Activity Tolerance Patient tolerated treatment well   ? Behavior During Therapy [facility] [place] for tasks assessed/performed   ? ?  ?  ? ?  ? ? ? ?Past Medical History:  ?Diagnosis Date  ? Abdominal bloating 05/10/2021  ? Acute gastritis 11/19/2020  ? Allergic rhinoconjunctivitis 07/02/2015  ? Anal fissure 11/19/2020  ? Arthritis   ? Benign positional vertigo 04/2015  ? Responded well to [facility]  ? Bruxism (teeth grinding)   ? Chronic contact dermatitis 08/06/2018  ? Per allergist Dr Pablo Lawrence.   ? Chronic migraine 02/25/2016  ? Per Dr Jaynee Eagles review in notes from [facility] from September 2015. Showed total headache days last month 18. Severe headache days 7 days. Moderate headache days 5 days. Mild headache days last month sick days. Days without headache last month 10 days. Symptoms associated with photophobia, phonophobia, osmophobia, neck pain, dizziness, jaw pain, nasal congestion, vision disturbances, tingling and numbness, weakness and worsening with activity. Each headache attack last 3 hours depending on treatment in severity. Left side, the right side, easier side, the frontal area in the back of the head. Characterized as throbbing, pressure, tightness, squeezing, stabbing and burning   ? Chronic migraine without aura 05/15/2013  ?  [facility]  ? Chronic tonsillitis 01/27/2019  ? DDD (degenerative disc disease), cervical 11/18/2018  ? DDD (degenerative disc disease), lumbar   ? Depression   ?  Disc displacement, lumbar   ? Dysrhythmia   ? seen by dr Wynonia Lawman- not a problem since she has been on Bystolic  ? Episodic cluster headache, not intractable 03/06/2017  ? Essential hypertension 05/15/2013  ? Family history of adverse reaction to anesthesia   ? Brother- N/V  ? Family history of premature CAD 05/15/2013  ? Fibromyalgia syndrome 07/01/2015  ? Management by Dr Chauncey Cruel. Devashwar ([facility])   ? GERD (gastroesophageal reflux disease) 12/16/2014  ? H/O seasonal allergies   ? Hammer toe   ? Left great toe  ? Hashimoto's thyroiditis   ? Per patient, diagnosed by Dr. Modena Nunnery  ? Hearing loss of both ears 07/27/2015  ? mild to borderline moderate low frequency hearing loss improving to within normal limits bilaterally on audiology testing at [facility] in November 2016.    ? History of Clostridium difficile colitis 07/01/2015  ? Required Fecal Transplantation tocure  ? History of colonic polyps   ? History of left hip replacement 09/20/2017  ? History of revision of total hip arthroplasty 04/10/2018  ? Hx of bad fall 02/2015  ? Severe Facial/head trauma without fracture  ? 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 Hyperlipidemia 1998  ? Hypokalemia due to excessive gastrointestinal loss of potassium 07/28/2019  ? Hypothyroidism   ? Impairment of balance 02/2015  ? Consequent of postconcussive syndrome  ? Injury of triangular fibrocartilage complex of left wrist 02/25/2019  ? Dx 02/25/19 Iran Planas IV MD [facility])  ?  Insulin resistance 07/04/2017  ? Internal hemorrhoid 01/28/2021  ? Internal hemorrhoid seen on colonoscopy 10/2020 Warnell Bureau MD [facility])  ? Interstitial cystitis   ? Irritable bowel syndrome with diarrhea 04/03/2016  ? Left ventricular hypertrophy, mild 02/25/2016  ? ECHOcardiogram report 06/17/15 showing EF55-60%, mild LVH and G1DD   ? 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[place]) 03/10/2018  ? [facility]  ? Lumbar facet joint pain   ? 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  ? Mood  disorder ([place])   ? Morbid obesity ([place]) 03/06/2017  ? Musculoskeletal neck pain 07/14/2015  ? Nocturnal hypoxemia 03/06/2017  ? Normal coronary arteries 05/14/2014  ? Obesity (BMI 30.0-34.9) 01/29/2017  ? Osteoarthritis of left hip 11/01/2015  ? MRI order by Dr Alvan Dame (ortho) 10/2015 showed significant arthritis of left hip joint with cystic changes in femoral head c/w osteoarthritis  ? 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 Other insomnia 11/09/2016  ? Overweight   ? Pain in joint of left shoulder 11/09/2016  ? Pain in joint, multiple sites 11/18/2018  ? Palpitations 05/15/2013  ? [facility] manages  ? Periodic limb movement sleep disorder 03/28/2017  ? Perirectal cyst 05/07/2016  ? Poison ivy dermatitis 03/24/2021  ? PONV (postoperative nausea and vomiting)   ? Positive ANA (antinuclear antibody) 11/18/2018  ? Post concussion syndrome 06/06/2015  ? Post concussive syndrome 07/14/2015  ? Ms Cypert's post-concussive syndrome manifesting in vertigo and headache, mood changes, poor balance, dizziness, and decreased concentration per Dr Jaynee Eagles at [facility].   ? Posterior vitreous detachment of right eye 2014  ? Rectal abscess 05/10/2021  ? S/P left THA, AA 07/25/2016  ? Shingles   ? Shortness of breath dyspnea   ? with exertion  ? 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 (+) ANA  ? Sleep walking and eating 03/06/2017  ? Snoring 03/06/2017  ? Spondylosis of lumbar region without myelopathy or radiculopathy 10/30/2011  ? TFC (triangular fibrocartilage complex) injury 02/25/2019  ? Thyroid nodule 08/11/2009  ? Findings: The thyroid gland is within normal limits in size.  The gland is diffusely inhomogeneous. A small solid nodule is noted in the lower pole  medially on the right of 7 x 6 x 8 mm. A small solid nodule is noted inferiorly on the left of 3 x 3 x 4 mm.  IMPRESSION:  The thyroid gland is within normal limits in size with only small solid nodules present,  the largest of only 8 mm in diameter on the right.    ? Trochanteric bursitis of left hip   ? 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   ? Trochanteric bursitis, right hip 04/26/2020  ? Tubular adenoma of colon 01/28/2021  ? Colonoscopy screening 4 mm tubular Adenoma polyp Wilford Corner, MD [facility])  ? Tubular adenoma of colon 01/28/2021  ? Colonoscopy screening 4 mm tubular Adenoma polyp Wilford Corner, MD [facility])  ? Vasomotor symptoms due to menopause 04/19/2017  ? Vitamin D deficiency 05/08/2017  ? Vulvitis 07/22/2020  ? Yeast vaginitis 07/11/2019  ? ?Past Surgical History:  ?Procedure Laterality Date  ? Bladder dilitation    ? x 3  ? BREAST BIOPSY Right 2011  ? Benign histology  ? CARDIOVASCULAR STRESS TEST  2000  ? Unremarkable per pt report  ? CARPOMETACARPAL JOINT ARTHROTOMY Right 2011  ? COLONOSCOPY    ? COLONOSCOPY WITH PROPOFOL N/A 04/21/2015  ? Procedure: COLONOSCOPY WITH PROPOFOL;  Surgeon: Carol Ada, MD;  Location: [facility];  Service: Endoscopy;  Laterality: N/A;  ? CYSTOSCOPY W/ DILATION OF BLADDER N/A   ? EPIDURAL BLOCK INJECTION Left 04/12/2016  ? Left Medial Nerve Block and Left L5 ramus block, Dr Suella Broad   ? EPIDURAL BLOCK INJECTION  03/21/2016  ? Left L3-4 medial branch block and Left L5 & dorsal ramus block   ? EPIDURAL BLOCK INJECTION N/A 10/25/2016  ? Suella Broad, MD. Lumbar medial branch block  ? EPIDURAL BLOCK INJECTION N/A 02/09/2017  ? Suella Broad, MD.  Bilateral L3/4 medial branch block, bilateral L5 dorsal ramus block  ? EPIDURAL BLOCK INJECTION N/A 07/04/2017  ? Suella Broad, MD  ? FECAL TRANSPLANT  04/21/2015  ? Procedure: FECAL TRANSPLANT;  Surgeon: Carol Ada, MD;  Location: [facility];  Service: Endoscopy;;  ? HIP ARTHROPLASTY Left   ? HIP ARTHROSCOPY Left 03/06/2018  ? Left hip arthroplasty, redo for loose hip arthroplasty. Procedure at [facility] hospital  ? [place] Left 11/2015  ? for OA by Dr Suella Broad  ? INTERSTIM IMPLANT PLACEMENT  04/2021  ? Pia Mau MD ([facility])  ? INTERSTIM IMPLANT REVISION N/A 09/2021  ? Pia Mau MD ([facility])  ? OTHER SURGICAL HISTORY Left 2016  ? Left

## 2021-12-27 ENCOUNTER — Encounter: Payer: Self-pay | Admitting: Physician Assistant

## 2021-12-27 ENCOUNTER — Ambulatory Visit (INDEPENDENT_AMBULATORY_CARE_PROVIDER_SITE_OTHER): Payer: Medicare Other | Admitting: Physician Assistant

## 2021-12-27 VITALS — BP 114/84 | HR 94 | Ht 66.5 in | Wt 192.2 lb

## 2021-12-27 DIAGNOSIS — M797 Fibromyalgia: Secondary | ICD-10-CM

## 2021-12-27 DIAGNOSIS — G4709 Other insomnia: Secondary | ICD-10-CM | POA: Diagnosis not present

## 2021-12-27 DIAGNOSIS — M19041 Primary osteoarthritis, right hand: Secondary | ICD-10-CM | POA: Diagnosis not present

## 2021-12-27 DIAGNOSIS — R5383 Other fatigue: Secondary | ICD-10-CM

## 2021-12-27 DIAGNOSIS — Z8669 Personal history of other diseases of the nervous system and sense organs: Secondary | ICD-10-CM

## 2021-12-27 DIAGNOSIS — M5030 Other cervical disc degeneration, unspecified cervical region: Secondary | ICD-10-CM

## 2021-12-27 DIAGNOSIS — Z96642 Presence of left artificial hip joint: Secondary | ICD-10-CM

## 2021-12-27 DIAGNOSIS — N3010 Interstitial cystitis (chronic) without hematuria: Secondary | ICD-10-CM

## 2021-12-27 DIAGNOSIS — M7061 Trochanteric bursitis, right hip: Secondary | ICD-10-CM

## 2021-12-27 DIAGNOSIS — G894 Chronic pain syndrome: Secondary | ICD-10-CM

## 2021-12-27 DIAGNOSIS — Z8659 Personal history of other mental and behavioral disorders: Secondary | ICD-10-CM

## 2021-12-27 DIAGNOSIS — E559 Vitamin D deficiency, unspecified: Secondary | ICD-10-CM

## 2021-12-27 DIAGNOSIS — Z8639 Personal history of other endocrine, nutritional and metabolic disease: Secondary | ICD-10-CM

## 2021-12-27 DIAGNOSIS — M19042 Primary osteoarthritis, left hand: Secondary | ICD-10-CM

## 2021-12-27 DIAGNOSIS — Z8679 Personal history of other diseases of the circulatory system: Secondary | ICD-10-CM

## 2021-12-27 DIAGNOSIS — M5136 Other intervertebral disc degeneration, lumbar region: Secondary | ICD-10-CM

## 2021-12-27 DIAGNOSIS — M7062 Trochanteric bursitis, left hip: Secondary | ICD-10-CM

## 2021-12-27 MED ORDER — CITALOPRAM HYDROBROMIDE 20 MG PO TABS: ORAL_TABLET | ORAL | 0 refills | Status: DC

## 2021-12-27 NOTE — Patient Instructions (Signed)
Hand Exercises Hand exercises can be helpful for almost anyone. These exercises can strengthen the hands, improve flexibility and movement, and increase blood flow to the hands. These results can make work and daily tasks easier. Hand exercises can be especially helpful for people who have joint pain from arthritis or have nerve damage from overuse (carpal tunnel syndrome). These exercises can also help people who have injured a hand. Exercises Most of these hand exercises are gentle stretching and motion exercises. It is usually safe to do them often throughout the day. Warming up your hands before exercise may help to reduce stiffness. You can do this with gentle massage or by placing your hands in warm water for 10-15 minutes. It is normal to feel some stretching, pulling, tightness, or mild discomfort as you begin new exercises. This will gradually improve. Stop an exercise right away if you feel sudden, severe pain or your pain gets worse. Ask your health care provider which exercises are best for you. Knuckle bend or "claw" fist  Stand or sit with your arm, hand, and all five fingers pointed straight up. Make sure to keep your wrist straight during the exercise. Gently bend your fingers down toward your palm until the tips of your fingers are touching the top of your palm. Keep your big knuckle straight and just bend the small knuckles in your fingers. Hold this position for __________ seconds. Straighten (extend) your fingers back to the starting position. Repeat this exercise 5-10 times with each hand. Full finger fist  Stand or sit with your arm, hand, and all five fingers pointed straight up. Make sure to keep your wrist straight during the exercise. Gently bend your fingers into your palm until the tips of your fingers are touching the middle of your palm. Hold this position for __________ seconds. Extend your fingers back to the starting position, stretching every joint fully. Repeat  this exercise 5-10 times with each hand. Straight fist Stand or sit with your arm, hand, and all five fingers pointed straight up. Make sure to keep your wrist straight during the exercise. Gently bend your fingers at the big knuckle, where your fingers meet your hand, and the middle knuckle. Keep the knuckle at the tips of your fingers straight and try to touch the bottom of your palm. Hold this position for __________ seconds. Extend your fingers back to the starting position, stretching every joint fully. Repeat this exercise 5-10 times with each hand. Tabletop  Stand or sit with your arm, hand, and all five fingers pointed straight up. Make sure to keep your wrist straight during the exercise. Gently bend your fingers at the big knuckle, where your fingers meet your hand, as far down as you can while keeping the small knuckles in your fingers straight. Think of forming a tabletop with your fingers. Hold this position for __________ seconds. Extend your fingers back to the starting position, stretching every joint fully. Repeat this exercise 5-10 times with each hand. Finger spread  Place your hand flat on a table with your palm facing down. Make sure your wrist stays straight as you do this exercise. Spread your fingers and thumb apart from each other as far as you can until you feel a gentle stretch. Hold this position for __________ seconds. Bring your fingers and thumb tight together again. Hold this position for __________ seconds. Repeat this exercise 5-10 times with each hand. Making circles  Stand or sit with your arm, hand, and all five fingers pointed   straight up. Make sure to keep your wrist straight during the exercise. Make a circle by touching the tip of your thumb to the tip of your index finger. Hold for __________ seconds. Then open your hand wide. Repeat this motion with your thumb and each finger on your hand. Repeat this exercise 5-10 times with each hand. Thumb  motion  Sit with your forearm resting on a table and your wrist straight. Your thumb should be facing up toward the ceiling. Keep your fingers relaxed as you move your thumb. Lift your thumb up as high as you can toward the ceiling. Hold for __________ seconds. Bend your thumb across your palm as far as you can, reaching the tip of your thumb for the small finger (pinkie) side of your palm. Hold for __________ seconds. Repeat this exercise 5-10 times with each hand. Grip strengthening  Hold a stress ball or other soft ball in the middle of your hand. Slowly increase the pressure, squeezing the ball as much as you can without causing pain. Think of bringing the tips of your fingers into the middle of your palm. All of your finger joints should bend when doing this exercise. Hold your squeeze for __________ seconds, then relax. Repeat this exercise 5-10 times with each hand. Contact a health care provider if: Your hand pain or discomfort gets much worse when you do an exercise. Your hand pain or discomfort does not improve within 2 hours after you exercise. If you have any of these problems, stop doing these exercises right away. Do not do them again unless your health care provider says that you can. Get help right away if: You develop sudden, severe hand pain or swelling. If this happens, stop doing these exercises right away. Do not do them again unless your health care provider says that you can. This information is not intended to replace advice given to you by your health care provider. Make sure you discuss any questions you have with your health care provider. Document Revised: 12/16/2020 Document Reviewed: 12/16/2020 Elsevier Patient Education  2023 [employer]  

## 2021-12-28 ENCOUNTER — Ambulatory Visit: Payer: Medicare Other

## 2021-12-28 DIAGNOSIS — M62838 Other muscle spasm: Secondary | ICD-10-CM | POA: Diagnosis not present

## 2021-12-28 DIAGNOSIS — R278 Other lack of coordination: Secondary | ICD-10-CM

## 2021-12-28 DIAGNOSIS — M6281 Muscle weakness (generalized): Secondary | ICD-10-CM

## 2021-12-28 NOTE — Therapy (Signed)
?OUTPATIENT PHYSICAL THERAPY TREATMENT NOTE ? ? ?Patient Name: Molly Giles ?MRN: [id number] ?DOB:01/17/1956, 66 y.o., female ?Today's Date: 12/28/2021 ? ?PCP: McDiarmid, Blane Ohara, MD ?REFERRING PROVIDER: Debbora Presto, NP ? ? PT End of Session - 12/28/21 1018   ? ? Visit Number 13   ? Date for PT Re-Evaluation 02/23/22   ? Authorization Type Medicare   ? Progress Note Due on Visit 10   ? PT Start Time 1018   ? PT Stop Time 1056   ? PT Time Calculation (min) 38 min   ? Activity Tolerance Patient tolerated treatment well   ? Behavior During Therapy [facility] for tasks assessed/performed   ? ?  ?  ? ?  ? ? ? ? ?Past Medical History:  ?Diagnosis Date  ? Abdominal bloating 05/10/2021  ? Acute gastritis 11/19/2020  ? Allergic rhinoconjunctivitis 07/02/2015  ? Anal fissure 11/19/2020  ? Arthritis   ? Benign positional vertigo 04/2015  ? Responded well to [facility]  ? Bruxism (teeth grinding)   ? Chronic contact dermatitis 08/06/2018  ? Per allergist Dr Pablo Lawrence.   ? Chronic migraine 02/25/2016  ? Per Dr Jaynee Eagles review in notes from [facility] from September 2015. Showed total headache days last month 18. Severe headache days 7 days. Moderate headache days 5 days. Mild headache days last month sick days. Days without headache last month 10 days. Symptoms associated with photophobia, phonophobia, osmophobia, neck pain, dizziness, jaw pain, nasal congestion, vision disturbances, tingling and numbness, weakness and worsening with activity. Each headache attack last 3 hours depending on treatment in severity. Left side, the right side, easier side, the frontal area in the back of the head. Characterized as throbbing, pressure, tightness, squeezing, stabbing and burning   ? Chronic migraine without aura 05/15/2013  ?  [facility]  ? Chronic tonsillitis 01/27/2019  ? DDD (degenerative disc disease), cervical 11/18/2018  ? DDD (degenerative disc disease), lumbar   ? Depression   ?  Disc displacement, lumbar   ? Dysrhythmia   ? seen by dr Wynonia Lawman- not a problem since she has been on Bystolic  ? Episodic cluster headache, not intractable 03/06/2017  ? Essential hypertension 05/15/2013  ? Family history of adverse reaction to anesthesia   ? Brother- N/V  ? Family history of premature CAD 05/15/2013  ? Fibromyalgia syndrome 07/01/2015  ? Management by Dr Chauncey Cruel. Devashwar (Rheum)   ? GERD (gastroesophageal reflux disease) 12/16/2014  ? H/O seasonal allergies   ? Hammer toe   ? Left great toe  ? Hashimoto's thyroiditis   ? Per patient, diagnosed by Dr. Modena Nunnery  ? Hearing loss of both ears 07/27/2015  ? mild to borderline moderate low frequency hearing loss improving to within normal limits bilaterally on audiology testing at [facility] in November 2016.    ? History of Clostridium difficile colitis 07/01/2015  ? Required Fecal Transplantation tocure  ? History of colonic polyps   ? History of left hip replacement 09/20/2017  ? History of revision of total hip arthroplasty 04/10/2018  ? Hx of bad fall 02/2015  ? Severe Facial/head trauma without fracture  ? 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 Hyperlipidemia 1998  ? Hypokalemia due to excessive gastrointestinal loss of potassium 07/28/2019  ? Hypothyroidism   ? Impairment of balance 02/2015  ? Consequent of postconcussive syndrome  ? Injury of triangular fibrocartilage complex of left wrist 02/25/2019  ? Dx 02/25/19 Iran Planas IV MD [facility])  ?  Insulin resistance 07/04/2017  ? Internal hemorrhoid 01/28/2021  ? Internal hemorrhoid seen on colonoscopy 10/2020 Warnell Bureau MD [facility])  ? Interstitial cystitis   ? Irritable bowel syndrome with diarrhea 04/03/2016  ? Left ventricular hypertrophy, mild 02/25/2016  ? ECHOcardiogram report 06/17/15 showing EF55-60%, mild LVH and G1DD   ? 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[place]) 03/10/2018  ? [facility]  ? Lumbar facet joint pain   ? 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  ? Mood  disorder ([place])   ? Morbid obesity ([place]) 03/06/2017  ? Musculoskeletal neck pain 07/14/2015  ? Nocturnal hypoxemia 03/06/2017  ? Normal coronary arteries 05/14/2014  ? Obesity (BMI 30.0-34.9) 01/29/2017  ? Osteoarthritis of left hip 11/01/2015  ? MRI order by Dr Alvan Dame (ortho) 10/2015 showed significant arthritis of left hip joint with cystic changes in femoral head c/w osteoarthritis  ? 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 Other insomnia 11/09/2016  ? Overweight   ? Pain in joint of left shoulder 11/09/2016  ? Pain in joint, multiple sites 11/18/2018  ? Palpitations 05/15/2013  ? [facility] manages  ? Periodic limb movement sleep disorder 03/28/2017  ? Perirectal cyst 05/07/2016  ? Poison ivy dermatitis 03/24/2021  ? PONV (postoperative nausea and vomiting)   ? Positive ANA (antinuclear antibody) 11/18/2018  ? Post concussion syndrome 06/06/2015  ? Post concussive syndrome 07/14/2015  ? Ms Mattice's post-concussive syndrome manifesting in vertigo and headache, mood changes, poor balance, dizziness, and decreased concentration per Dr Jaynee Eagles at [facility].   ? Posterior vitreous detachment of right eye 2014  ? Rectal abscess 05/10/2021  ? S/P left THA, AA 07/25/2016  ? Shingles   ? Shortness of breath dyspnea   ? with exertion  ? Sicca syndrome ([place]) 11/18/2018  ? (+) ANA  ? Sleep walking and eating 03/06/2017  ? Snoring 03/06/2017  ? Spondylosis of lumbar region without myelopathy or radiculopathy 10/30/2011  ? TFC (triangular fibrocartilage complex) injury 02/25/2019  ? Thyroid nodule 08/11/2009  ? Findings: The thyroid gland is within normal limits in size.  The gland is diffusely inhomogeneous. A small solid nodule is noted in the lower pole  medially on the right of 7 x 6 x 8 mm. A small solid nodule is noted inferiorly on the left of 3 x 3 x 4 mm.  IMPRESSION:  The thyroid gland is within normal limits in size with only small solid nodules present,  the largest of only 8 mm in diameter on the right.    ? Trochanteric bursitis of left hip   ? 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   ? Trochanteric bursitis, right hip 04/26/2020  ? Tubular adenoma of colon 01/28/2021  ? Colonoscopy screening 4 mm tubular Adenoma polyp Wilford Corner, MD [facility])  ? Tubular adenoma of colon 01/28/2021  ? Colonoscopy screening 4 mm tubular Adenoma polyp Wilford Corner, MD [facility])  ? Vasomotor symptoms due to menopause 04/19/2017  ? Vitamin D deficiency 05/08/2017  ? Vulvitis 07/22/2020  ? Yeast vaginitis 07/11/2019  ? ?Past Surgical History:  ?Procedure Laterality Date  ? Bladder dilitation    ? x 3  ? BREAST BIOPSY Right 2011  ? Benign histology  ? CARDIOVASCULAR STRESS TEST  2000  ? Unremarkable per pt report  ? CARPOMETACARPAL JOINT ARTHROTOMY Right 2011  ? COLONOSCOPY    ? COLONOSCOPY WITH PROPOFOL N/A 04/21/2015  ? Procedure: COLONOSCOPY WITH PROPOFOL;  Surgeon: Carol Ada, MD;  Location: [facility];  Service: Endoscopy;  Laterality: N/A;  ? CYSTOSCOPY W/ DILATION OF BLADDER N/A   ? EPIDURAL BLOCK INJECTION Left 04/12/2016  ? Left Medial Nerve Block and Left L5 ramus block, Dr Suella Broad   ? EPIDURAL BLOCK INJECTION  03/21/2016  ? Left L3-4 medial branch block and Left L5 & dorsal ramus block   ? EPIDURAL BLOCK INJECTION N/A 10/25/2016  ? Suella Broad, MD. Lumbar medial branch block  ? EPIDURAL BLOCK INJECTION N/A 02/09/2017  ? Suella Broad, MD.  Bilateral L3/4 medial branch block, bilateral L5 dorsal ramus block  ? EPIDURAL BLOCK INJECTION N/A 07/04/2017  ? Suella Broad, MD  ? FECAL TRANSPLANT  04/21/2015  ? Procedure: FECAL TRANSPLANT;  Surgeon: Carol Ada, MD;  Location: [facility];  Service: Endoscopy;;  ? HIP ARTHROPLASTY Left   ? HIP ARTHROSCOPY Left 03/06/2018  ? Left hip arthroplasty, redo for loose hip arthroplasty. Procedure at [facility] hospital  ? [place] Left 11/2015  ? for OA by Dr Suella Broad  ? INTERSTIM IMPLANT PLACEMENT  04/2021  ? Pia Mau MD ([facility])  ? INTERSTIM IMPLANT REVISION N/A 09/2021  ? Pia Mau MD ([facility])  ? OTHER SURGICAL HISTORY Left 2016  ? [place]

## 2022-01-02 ENCOUNTER — Ambulatory Visit: Payer: Medicare Other

## 2022-01-02 DIAGNOSIS — M62838 Other muscle spasm: Secondary | ICD-10-CM | POA: Diagnosis not present

## 2022-01-02 DIAGNOSIS — M6281 Muscle weakness (generalized): Secondary | ICD-10-CM

## 2022-01-02 DIAGNOSIS — R278 Other lack of coordination: Secondary | ICD-10-CM

## 2022-01-02 NOTE — Therapy (Signed)
?OUTPATIENT PHYSICAL THERAPY TREATMENT NOTE ? ? ?Patient Name: Molly Giles ?MRN: [id number] ?DOB:[birth year], 66 y.o., female ?Today's Date: 01/02/2022 ? ?PCP: McDiarmid, Blane Ohara, MD ?REFERRING PROVIDER: Debbora Presto, NP ? ? PT End of Session - 01/02/22 1242   ? ? Visit Number 14   ? Date for PT Re-Evaluation 02/23/22   ? Authorization Type Medicare   ? Progress Note Due on Visit 20   ? PT Start Time 1234   ? PT Stop Time 1312   ? PT Time Calculation (min) 38 min   ? ?  ?  ? ?  ? ? ? ? ? ?Past Medical History:  ?Diagnosis Date  ? Abdominal bloating 05/10/2021  ? Acute gastritis 11/19/2020  ? Allergic rhinoconjunctivitis 07/02/2015  ? Anal fissure 11/19/2020  ? Arthritis   ? Benign positional vertigo 04/2015  ? Responded well to [facility]  ? Bruxism (teeth grinding)   ? Chronic contact dermatitis 08/06/2018  ? Per allergist Dr Pablo Lawrence.   ? Chronic migraine 02/25/2016  ? Per Dr Jaynee Eagles review in notes from [facility] from September 2015. Showed total headache days last month 18. Severe headache days 7 days. Moderate headache days 5 days. Mild headache days last month sick days. Days without headache last month 10 days. Symptoms associated with photophobia, phonophobia, osmophobia, neck pain, dizziness, jaw pain, nasal congestion, vision disturbances, tingling and numbness, weakness and worsening with activity. Each headache attack last 3 hours depending on treatment in severity. Left side, the right side, easier side, the frontal area in the back of the head. Characterized as throbbing, pressure, tightness, squeezing, stabbing and burning   ? Chronic migraine without aura 05/15/2013  ?  [facility]  ? Chronic tonsillitis 01/27/2019  ? DDD (degenerative disc disease), cervical 11/18/2018  ? DDD (degenerative disc disease), lumbar   ? Depression   ? Disc displacement, lumbar   ? Dysrhythmia   ? seen by dr Wynonia Lawman- not a problem since she has been on Bystolic  ?  Episodic cluster headache, not intractable 03/06/2017  ? Essential hypertension 05/15/2013  ? Family history of adverse reaction to anesthesia   ? Brother- N/V  ? Family history of premature CAD 05/15/2013  ? Fibromyalgia syndrome 07/01/2015  ? Management by Dr Chauncey Cruel. Devashwar ([facility])   ? GERD (gastroesophageal reflux disease) 12/16/2014  ? H/O seasonal allergies   ? Hammer toe   ? Left great toe  ? Hashimoto's thyroiditis   ? Per patient, diagnosed by Dr. Modena Nunnery  ? Hearing loss of both ears 07/27/2015  ? mild to borderline moderate low frequency hearing loss improving to within normal limits bilaterally on audiology testing at [facility] in November 2016.    ? History of Clostridium difficile colitis 07/01/2015  ? Required Fecal Transplantation tocure  ? History of colonic polyps   ? History of left hip replacement 09/20/2017  ? History of revision of total hip arthroplasty 04/10/2018  ? Hx of bad fall 02/2015  ? Severe Facial/head trauma without fracture  ? 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 Hyperlipidemia 1998  ? Hypokalemia due to excessive gastrointestinal loss of potassium 07/28/2019  ? Hypothyroidism   ? Impairment of balance 02/2015  ? Consequent of postconcussive syndrome  ? Injury of triangular fibrocartilage complex of left wrist 02/25/2019  ? Dx 02/25/19 Iran Planas IV MD [facility])  ? Insulin resistance 07/04/2017  ? Internal hemorrhoid 01/28/2021  ? Internal hemorrhoid seen on colonoscopy 10/2020 (V.  Schooler MD [facility])  ? Interstitial cystitis   ? Irritable bowel syndrome with diarrhea 04/03/2016  ? Left ventricular hypertrophy, mild 02/25/2016  ? ECHOcardiogram report 06/17/15 showing EF55-60%, mild LVH and G1DD   ? 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[place]) 03/10/2018  ? [facility]  ? Lumbar facet joint pain   ? 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  ? Mood disorder ([place])   ? Morbid obesity ([place]) 03/06/2017  ? Musculoskeletal neck pain 07/14/2015  ? Nocturnal hypoxemia  03/06/2017  ? Normal coronary arteries 05/14/2014  ? Obesity (BMI 30.0-34.9) 01/29/2017  ? Osteoarthritis of left hip 11/01/2015  ? MRI order by Dr Alvan Dame (ortho) 10/2015 showed significant arthritis of left hip joint with cystic changes in femoral head c/w osteoarthritis  ? 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 Other insomnia 11/09/2016  ? Overweight   ? Pain in joint of left shoulder 11/09/2016  ? Pain in joint, multiple sites 11/18/2018  ? Palpitations 05/15/2013  ? [facility] manages  ? Periodic limb movement sleep disorder 03/28/2017  ? Perirectal cyst 05/07/2016  ? Poison ivy dermatitis 03/24/2021  ? PONV (postoperative nausea and vomiting)   ? Positive ANA (antinuclear antibody) 11/18/2018  ? Post concussion syndrome 06/06/2015  ? Post concussive syndrome 07/14/2015  ? Ms Lauro's post-concussive syndrome manifesting in vertigo and headache, mood changes, poor balance, dizziness, and decreased concentration per Dr Jaynee Eagles at [facility].   ? Posterior vitreous detachment of right eye 2014  ? Rectal abscess 05/10/2021  ? S/P left THA, AA 07/25/2016  ? Shingles   ? Shortness of breath dyspnea   ? with exertion  ? Sicca syndrome ([place]) 11/18/2018  ? (+) ANA  ? Sleep walking and eating 03/06/2017  ? Snoring 03/06/2017  ? Spondylosis of lumbar region without myelopathy or radiculopathy 10/30/2011  ? TFC (triangular fibrocartilage complex) injury 02/25/2019  ? Thyroid nodule 08/11/2009  ? Findings: The thyroid gland is within normal limits in size.  The gland is diffusely inhomogeneous. A small solid nodule is noted in the lower pole  medially on the right of 7 x 6 x 8 mm. A small solid nodule is noted inferiorly on the left of 3 x 3 x 4 mm.  IMPRESSION:  The thyroid gland is within normal limits in size with only small solid nodules present, the largest of only 8 mm in diameter on the right.    ? Trochanteric bursitis of left hip   ? Osteoarthritis from  left hip dysplasia; mild dysplasia Crowe 1.   ? Trochanteric bursitis, right hip 04/26/2020  ? Tubular adenoma of colon 01/28/2021  ? Colonoscopy screening 4 mm tubular Adenoma polyp Wilford Corner, MD [facility])  ? Tubular adenoma of colon 01/28/2021  ? Colonoscopy screening 4 mm tubular Adenoma polyp Wilford Corner, MD [facility])  ? Vasomotor symptoms due to menopause 04/19/2017  ? Vitamin D deficiency 05/08/2017  ? Vulvitis 07/22/2020  ? Yeast vaginitis 07/11/2019  ? ?Past Surgical History:  ?Procedure Laterality Date  ? Bladder dilitation    ? x 3  ? BREAST BIOPSY Right 2011  ? Benign histology  ? CARDIOVASCULAR STRESS TEST  2000  ? Unremarkable per pt report  ? CARPOMETACARPAL JOINT ARTHROTOMY Right 2011  ? COLONOSCOPY    ? COLONOSCOPY WITH PROPOFOL N/A 04/21/2015  ? Procedure: COLONOSCOPY WITH PROPOFOL;  Surgeon: Carol Ada, MD;  Location: [facility];  Service: Endoscopy;  Laterality: N/A;  ? CYSTOSCOPY W/ DILATION OF BLADDER N/A   ?  EPIDURAL BLOCK INJECTION Left 04/12/2016  ? Left Medial Nerve Block and Left L5 ramus block, Dr Suella Broad   ? EPIDURAL BLOCK INJECTION  03/21/2016  ? Left L3-4 medial branch block and Left L5 & dorsal ramus block   ? EPIDURAL BLOCK INJECTION N/A 10/25/2016  ? Suella Broad, MD. Lumbar medial branch block  ? EPIDURAL BLOCK INJECTION N/A 02/09/2017  ? Suella Broad, MD.  Bilateral L3/4 medial branch block, bilateral L5 dorsal ramus block  ? EPIDURAL BLOCK INJECTION N/A 07/04/2017  ? Suella Broad, MD  ? FECAL TRANSPLANT  04/21/2015  ? Procedure: FECAL TRANSPLANT;  Surgeon: Carol Ada, MD;  Location: [facility];  Service: Endoscopy;;  ? HIP ARTHROPLASTY Left   ? HIP ARTHROSCOPY Left 03/06/2018  ? Left hip arthroplasty, redo for loose hip arthroplasty. Procedure at [facility]  ? [place] Left 11/2015  ? for OA by Dr Suella Broad  ? INTERSTIM IMPLANT PLACEMENT  04/2021  ? Pia Mau MD ([facility])  ? INTERSTIM IMPLANT REVISION  N/A 09/2021  ? Pia Mau MD ([facility])  ? OTHER SURGICAL HISTORY Left 2016  ? Left L3/L4 medial nerve block and Left L5 Dorsal Ramus block Dr Mickel Duhamel  ? TOTAL HIP ARTHROPLASTY Left 07/25/2016  ?

## 2022-01-04 ENCOUNTER — Ambulatory Visit (HOSPITAL_BASED_OUTPATIENT_CLINIC_OR_DEPARTMENT_OTHER): Payer: Medicare Other | Admitting: Psychiatry

## 2022-01-04 DIAGNOSIS — F4323 Adjustment disorder with mixed anxiety and depressed mood: Secondary | ICD-10-CM | POA: Diagnosis not present

## 2022-01-04 MED ORDER — LORAZEPAM 0.5 MG PO TABS: ORAL_TABLET | ORAL | 3 refills | Status: DC

## 2022-01-04 NOTE — Progress Notes (Signed)
Psychiatric Initial Adult Assessment  ? ?Patient Identification: Molly Giles ?MRN:  [id number] ?Date of Evaluation:  01/04/2022 ?Referral Source:  ?Chief Complaint:   ?Visit Diagnosis: Adjustment disorder ? ?History of Present Illness: Dr. Jamie Kato ? ?This patient is a 66 year old white married female who has no psychiatric history.  She is being seen for progressive anxiety.  The issue is directly related to her son and daughter-in-law and a conflict with them.  On Labor Day this past year they were vacationing together and became evident that her son was drinking excessively.  In the beginning of the vacation the patient's daughter-in-law took her 2 children ages 24 and 50 and left the vacation setting being very upset with her husband the patient's only son who is drinking excessively.  Since then he has sought treatment.  There was further conflict that occurred in early December around her granddaughter's birthday.  The patient spoke to her son's neighbor in a very benign way thanking him for being supportive about looking into his son and supporting his son as he was entering into alcohol treatment.  At this time the patient's son has been sober for 6 months.  Apparently the patient's daughter-in-law is very upset with what she feels as intrusion by this patient in their lives.  The daughter-in-law, Molly Giles actively prevents the patient and her husband from seeing their grandchildren.  They do not allow them to have access to them.  Molly Giles openly expresses her anger with the patient.  Clearly there are dynamic issues.  The son's name is Molly Giles does not really step up for his parents.  Rather he takes the position that he got to support his wife Molly Giles.  The patient does not know what stance to take to best be able to access both her son is going through alcohol treatment and for the 2 grandchildren that they are very invested with.  The patient has been happily married for 40 years in a stable good  marriage.  The patient is a retired Arts development officer person.  The patient actually denies persistent daily depression.  She has intense paralyzing anxiety.  It is directly related to this conflict with her son and daughter-in-law.  The patient is sleeping fairly well taking 5 mg of Ambien.  She is eating okay has good energy and has no problems thinking or concentrating.  She is still able to enjoy things.  She watches TV plays games on her phone and has a dog that she enjoys.  She is still very social.  The patient denies being worthless.  She denies suicidal ideation and has never made a suicide attempt.  She denies the use of any alcohol or drugs.  She has never been psychotic.  In close evaluation she denies ever having a distinct episode of major depression.  She has never had a manic symptoms.  She denies the symptoms of generalized anxiety disorder, panic disorder or excessive compulsive disorder.  The patient takes Celexa 20 mg for her interstitial cystitis.  She takes 5 mg of Ambien.  She occasionally takes Valium 10 mg for bladder spasms.  The patient does not smoke cigarettes.  Her medical history includes fibromyalgia, interstitial cystitis hypothyroidism, arthritis and headaches.  Her past psychiatric history is significant for never being in a psychiatrist office/never has been evaluated by a psychiatrist.  She is never been in a psychiatric hospital and she has never been in one-to-one therapy.  Her only psychiatric medicines are Celexa and Ambien which are  mainly for her bladder condition. ? ?Associated Signs/Symptoms: ?Depression Symptoms:  anxiety, ?(Hypo) Manic Symptoms:   ?Anxiety Symptoms:  Excessive Worry, ?Psychotic Symptoms:  PTSD Symptoms: ?NA ? ?Past Psychiatric History:  ? ?Previous Psychotropic Medications: Yes  ? ?Substance Abuse History in the last 12 months:  No. ? ?Consequences of Substance Abuse: ?NA ? ?Past Medical History:  ?Past Medical History:  ?Diagnosis Date  ? Abdominal bloating  05/10/2021  ? Acute gastritis 11/19/2020  ? Allergic rhinoconjunctivitis 07/02/2015  ? Anal fissure 11/19/2020  ? Arthritis   ? Benign positional vertigo 04/2015  ? Responded well to [facility]  ? Bruxism (teeth grinding)   ? Chronic contact dermatitis 08/06/2018  ? Per allergist Dr Pablo Lawrence.   ? Chronic migraine 02/25/2016  ? Per Dr Jaynee Eagles review in notes from [facility] from September 2015. Showed total headache days last month 18. Severe headache days 7 days. Moderate headache days 5 days. Mild headache days last month sick days. Days without headache last month 10 days. Symptoms associated with photophobia, phonophobia, osmophobia, neck pain, dizziness, jaw pain, nasal congestion, vision disturbances, tingling and numbness, weakness and worsening with activity. Each headache attack last 3 hours depending on treatment in severity. Left side, the right side, easier side, the frontal area in the back of the head. Characterized as throbbing, pressure, tightness, squeezing, stabbing and burning   ? Chronic migraine without aura 05/15/2013  ?  [facility]  ? Chronic tonsillitis 01/27/2019  ? DDD (degenerative disc disease), cervical 11/18/2018  ? DDD (degenerative disc disease), lumbar   ? Depression   ? Disc displacement, lumbar   ? Dysrhythmia   ? seen by dr Wynonia Lawman- not a problem since she has been on Bystolic  ? Episodic cluster headache, not intractable 03/06/2017  ? Essential hypertension 05/15/2013  ? Family history of adverse reaction to anesthesia   ? Brother- N/V  ? Family history of premature CAD 05/15/2013  ? Fibromyalgia syndrome 07/01/2015  ? Management by Dr Chauncey Cruel. Devashwar ([facility])   ? GERD (gastroesophageal reflux disease) 12/16/2014  ? H/O seasonal allergies   ? Hammer toe   ? Left great toe  ? Hashimoto's thyroiditis   ? Per patient, diagnosed by Dr. Modena Nunnery  ? Hearing loss of both ears 07/27/2015  ? mild to borderline moderate low frequency  hearing loss improving to within normal limits bilaterally on audiology testing at [facility] in November 2016.    ? History of Clostridium difficile colitis 07/01/2015  ? Required Fecal Transplantation tocure  ? History of colonic polyps   ? History of left hip replacement 09/20/2017  ? History of revision of total hip arthroplasty 04/10/2018  ? Hx of bad fall 02/2015  ? Severe Facial/head trauma without fracture  ? 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 Hyperlipidemia 1998  ? Hypokalemia due to excessive gastrointestinal loss of potassium 07/28/2019  ? Hypothyroidism   ? Impairment of balance 02/2015  ? Consequent of postconcussive syndrome  ? Injury of triangular fibrocartilage complex of left wrist 02/25/2019  ? Dx 02/25/19 Iran Planas IV MD [facility])  ? Insulin resistance 07/04/2017  ? Internal hemorrhoid 01/28/2021  ? Internal hemorrhoid seen on colonoscopy 10/2020 Warnell Bureau MD [facility])  ? Interstitial cystitis   ? Irritable bowel syndrome with diarrhea 04/03/2016  ? Left ventricular hypertrophy, mild 02/25/2016  ? ECHOcardiogram report 06/17/15 showing EF55-60%, mild LVH and G1DD   ? 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[place]) 03/10/2018  ? [facility]  ?  Lumbar facet joint pain   ? 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  ? Mood disorder ([place])   ? Morbid obesity ([place]) 03/06/2017  ? Musculoskeletal neck pain 07/14/2015  ? Nocturnal hypoxemia 03/06/2017  ? Normal coronary arteries 05/14/2014  ? Obesity (BMI 30.0-34.9) 01/29/2017  ? Osteoarthritis of left hip 11/01/2015  ? MRI order by Dr Alvan Dame (ortho) 10/2015 showed significant arthritis of left hip joint with cystic changes in femoral head c/w osteoarthritis  ? 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 Other insomnia 11/09/2016  ? Overweight   ? Pain in joint of left shoulder 11/09/2016  ? Pain in joint, multiple sites 11/18/2018  ? Palpitations 05/15/2013  ? [facility] manages  ? Periodic  limb movement sleep disorder 03/28/2017  ? Perirectal cyst 05/07/2016  ? Poison ivy dermatitis 03/24/2021  ? PONV (postoperative nausea and vomiting)   ? Positive ANA (antinuclear antibody) 11/18/2018  ? Po

## 2022-01-08 ENCOUNTER — Other Ambulatory Visit: Payer: Self-pay | Admitting: Rheumatology

## 2022-01-08 DIAGNOSIS — M797 Fibromyalgia: Secondary | ICD-10-CM

## 2022-01-09 HISTORY — PX: OTHER SURGICAL HISTORY: SHX169

## 2022-01-09 NOTE — Telephone Encounter (Signed)
Attempted to contact the patient and left message for patient to call the office.  

## 2022-01-09 NOTE — Telephone Encounter (Signed)
Patient advised she received 60 tablets of lorazepam and 30 tablets of Valium in April.  We would not be able to give her Ambien while she is on benzodiazepines. Patient states she crushes the Valium and places it in her bladder for IC. Patient states Dr. Casimiro Needle advised her to take the lorazepam on top of the Ambien for anxiety and because the Ambien was not working so well.  ?

## 2022-01-09 NOTE — Telephone Encounter (Signed)
Next Visit: 06/28/2022 ? ?Last Visit: 12/27/2021 ? ?Last Fill: 12/08/2021 ? ?Dx: Other insomnia  ? ?Current Dose per office note on 12/27/2021: Ambien 5 mg 1 tablet by mouth at bedtime  ? ?Okay to refill Ambien?   ?

## 2022-01-09 NOTE — Telephone Encounter (Signed)
Patient received 60 tablets of lorazepam and 30 tablets of Valium in April.  We would not be able to give her Ambien while she is on benzodiazepines.

## 2022-01-10 ENCOUNTER — Telehealth: Payer: Self-pay | Admitting: Rheumatology

## 2022-01-10 MED ORDER — ZOLPIDEM TARTRATE 5 MG PO TABS: ORAL_TABLET | ORAL | 0 refills | Status: DC

## 2022-01-10 NOTE — Telephone Encounter (Signed)
Patient advised she will have to get Ambien from her PCP.  Dr. Estanislado Pandy does not feel comfortable combining Ambien with lorazepam. Patient is insistent that her PCP will not prescribe Ambien. Patient states she was placed on Ambien from our office about 20 years ago as she was advised it was important for those with Fibromyalgia to sleep. Patient states she would not mind coming off the Ambien but states she does not want to quit it cold [place] as she has read that it is not good to do so. I again emphasized that Dr. Estanislado Pandy does not feel comfortable prescribing the Ambien at all as she is on Lorazepam. Patient states she would like for you to call her to discuss.  ?

## 2022-01-10 NOTE — Addendum Note (Signed)
Addended by: Carole Binning on: 01/10/2022 05:02 PM ? ? Modules accepted: Orders ? ?

## 2022-01-10 NOTE — Telephone Encounter (Signed)
Patient left a voicemail stating she needs to speak with Dr. Estanislado Pandy today. Patient states she has a serious issue and needs a call today. ?

## 2022-01-10 NOTE — Telephone Encounter (Signed)
I returned the patient's call.  Patient stated that she would request future refills of Ambien from her PCP.  Although she would like to taper Ambien and requested only 5 tablets of Ambien.  I advised her to take half a tablet of Ambien 5 mg p.o. nightly as needed until she can completely come off Ambien.  Patient voiced understanding.

## 2022-01-10 NOTE — Telephone Encounter (Signed)
She will have to get Ambien from her PCP.  I do not feel comfortable combining Ambien with lorazepam.

## 2022-01-11 ENCOUNTER — Telehealth: Payer: Self-pay

## 2022-01-11 ENCOUNTER — Other Ambulatory Visit (HOSPITAL_COMMUNITY): Payer: Self-pay | Admitting: Psychiatry

## 2022-01-11 DIAGNOSIS — G4709 Other insomnia: Secondary | ICD-10-CM

## 2022-01-11 MED ORDER — ZOLPIDEM TARTRATE 5 MG PO TABS
5.0000 mg | ORAL_TABLET | Freq: Every evening | ORAL | 4 refills | Status: DC | PRN
Start: 2022-01-11 — End: 2022-07-12

## 2022-01-11 MED ORDER — ZOLPIDEM TARTRATE 5 MG PO TABS: 5.0000 mg | ORAL_TABLET | Freq: Every evening | ORAL | 0 refills | Status: DC | PRN

## 2022-01-11 NOTE — Telephone Encounter (Signed)
Patient calls nurse line in regards to Ambien prescription.  ? ?Patient reports she was recently prescribed Ativan by Dr. Casimiro Needle and Rheumatology will no longer fill her Ambien and asks PCP to take over. (See telephone note) ? ?Patient reports she is currently in the process of weaning herself off Ambien, however Rheumatology only gave her a refill of #5.  ? ?Patient has an apt with PCP for 5/25 to discuss. However, patient does not want to go without Ambien and would like to continue weaning process. Patient is currently taking 1/2 tab at bedtime.  ? ?Will forward to PCP.  ?

## 2022-01-13 MED ORDER — ZOLPIDEM TARTRATE 5 MG PO TABS: 2.5000 mg | ORAL_TABLET | Freq: Every evening | ORAL | 0 refills | Status: DC | PRN

## 2022-01-13 NOTE — Telephone Encounter (Signed)
Rx Zolpidem 5 mg tablet, 1/2 ta at bedtime #15, RF 0 ?Discuss further wean at office visit 02/02/22 ?

## 2022-01-18 ENCOUNTER — Ambulatory Visit: Payer: Medicare Other | Attending: Family Medicine

## 2022-01-18 DIAGNOSIS — M6281 Muscle weakness (generalized): Secondary | ICD-10-CM | POA: Insufficient documentation

## 2022-01-18 DIAGNOSIS — M62838 Other muscle spasm: Secondary | ICD-10-CM | POA: Insufficient documentation

## 2022-01-18 DIAGNOSIS — R278 Other lack of coordination: Secondary | ICD-10-CM | POA: Insufficient documentation

## 2022-01-18 NOTE — Therapy (Signed)
?OUTPATIENT PHYSICAL THERAPY TREATMENT NOTE ? ? ?Patient Name: Molly Giles ?MRN: [id number] ?DOB:04-May-1956, 66 y.o., female ?Today's Date: 01/18/2022 ? ?PCP: McDiarmid, Blane Ohara, MD ?REFERRING PROVIDER: Debbora Presto, NP ? ? PT End of Session - 01/18/22 1616   ? ? Visit Number 15   ? Date for PT Re-Evaluation 02/23/22   ? Authorization Type Medicare   ? Progress Note Due on Visit 20   ? PT Start Time 1615   ? PT Stop Time 1655   ? PT Time Calculation (min) 40 min   ? Activity Tolerance Patient tolerated treatment well   ? Behavior During Therapy [facility] for tasks assessed/performed   ? ?  ?  ? ?  ? ? ? ? ? ?Past Medical History:  ?Diagnosis Date  ? Abdominal bloating 05/10/2021  ? Acute gastritis 11/19/2020  ? Allergic rhinoconjunctivitis 07/02/2015  ? Anal fissure 11/19/2020  ? Arthritis   ? Benign positional vertigo 04/2015  ? Responded well to [facility]  ? Bruxism (teeth grinding)   ? Chronic contact dermatitis 08/06/2018  ? Per allergist Dr Pablo Lawrence.   ? Chronic migraine 02/25/2016  ? Per Dr Jaynee Eagles review in notes from [facility] from September 2015. Showed total headache days last month 18. Severe headache days 7 days. Moderate headache days 5 days. Mild headache days last month sick days. Days without headache last month 10 days. Symptoms associated with photophobia, phonophobia, osmophobia, neck pain, dizziness, jaw pain, nasal congestion, vision disturbances, tingling and numbness, weakness and worsening with activity. Each headache attack last 3 hours depending on treatment in severity. Left side, the right side, easier side, the frontal area in the back of the head. Characterized as throbbing, pressure, tightness, squeezing, stabbing and burning   ? Chronic migraine without aura 05/15/2013  ?  [facility]  ? Chronic tonsillitis 01/27/2019  ? DDD (degenerative disc disease), cervical 11/18/2018  ? DDD (degenerative disc disease), lumbar   ? Depression   ?  Disc displacement, lumbar   ? Dysrhythmia   ? seen by dr Wynonia Lawman- not a problem since she has been on Bystolic  ? Episodic cluster headache, not intractable 03/06/2017  ? Essential hypertension 05/15/2013  ? Family history of adverse reaction to anesthesia   ? Brother- N/V  ? Family history of premature CAD 05/15/2013  ? Fibromyalgia syndrome 07/01/2015  ? Management by Dr Chauncey Cruel. Devashwar ([facility])   ? GERD (gastroesophageal reflux disease) 12/16/2014  ? H/O seasonal allergies   ? Hammer toe   ? Left great toe  ? Hashimoto's thyroiditis   ? Per patient, diagnosed by Dr. Modena Nunnery  ? Hearing loss of both ears 07/27/2015  ? mild to borderline moderate low frequency hearing loss improving to within normal limits bilaterally on audiology testing at [facility] in November 2016.    ? History of Clostridium difficile colitis 07/01/2015  ? Required Fecal Transplantation tocure  ? History of colonic polyps   ? History of left hip replacement 09/20/2017  ? History of revision of total hip arthroplasty 04/10/2018  ? Hx of bad fall 02/2015  ? Severe Facial/head trauma without fracture  ? 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 Hyperlipidemia 1998  ? Hypokalemia due to excessive gastrointestinal loss of potassium 07/28/2019  ? Hypothyroidism   ? Impairment of balance 02/2015  ? Consequent of postconcussive syndrome  ? Injury of triangular fibrocartilage complex of left wrist 02/25/2019  ? Dx 02/25/19 Iran Planas IV MD [facility] Gutting)  ?  Insulin resistance 07/04/2017  ? Internal hemorrhoid 01/28/2021  ? Internal hemorrhoid seen on colonoscopy 10/2020 Warnell Bureau MD [facility])  ? Interstitial cystitis   ? Irritable bowel syndrome with diarrhea 04/03/2016  ? Left ventricular hypertrophy, mild 02/25/2016  ? ECHOcardiogram report 06/17/15 showing EF55-60%, mild LVH and G1DD   ? 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[place]) 03/10/2018  ? [facility]  ? Lumbar facet joint pain   ? 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  ? Mood  disorder ([place])   ? Morbid obesity ([place]) 03/06/2017  ? Musculoskeletal neck pain 07/14/2015  ? Nocturnal hypoxemia 03/06/2017  ? Normal coronary arteries 05/14/2014  ? Obesity (BMI 30.0-34.9) 01/29/2017  ? Osteoarthritis of left hip 11/01/2015  ? MRI order by Dr Alvan Dame (ortho) 10/2015 showed significant arthritis of left hip joint with cystic changes in femoral head c/w osteoarthritis  ? 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 Other insomnia 11/09/2016  ? Overweight   ? Pain in joint of left shoulder 11/09/2016  ? Pain in joint, multiple sites 11/18/2018  ? Palpitations 05/15/2013  ? [facility] manages  ? Periodic limb movement sleep disorder 03/28/2017  ? Perirectal cyst 05/07/2016  ? Poison ivy dermatitis 03/24/2021  ? PONV (postoperative nausea and vomiting)   ? Positive ANA (antinuclear antibody) 11/18/2018  ? Post concussion syndrome 06/06/2015  ? Post concussive syndrome 07/14/2015  ? Ms Buttermore's post-concussive syndrome manifesting in vertigo and headache, mood changes, poor balance, dizziness, and decreased concentration per Dr Jaynee Eagles at [facility].   ? Posterior vitreous detachment of right eye 2014  ? Rectal abscess 05/10/2021  ? S/P left THA, AA 07/25/2016  ? Shingles   ? Shortness of breath dyspnea   ? with exertion  ? Sicca syndrome ([place]) 11/18/2018  ? (+) ANA  ? Sleep walking and eating 03/06/2017  ? Snoring 03/06/2017  ? Spondylosis of lumbar region without myelopathy or radiculopathy 10/30/2011  ? TFC (triangular fibrocartilage complex) injury 02/25/2019  ? Thyroid nodule 08/11/2009  ? Findings: The thyroid gland is within normal limits in size.  The gland is diffusely inhomogeneous. A small solid nodule is noted in the lower pole  medially on the right of 7 x 6 x 8 mm. A small solid nodule is noted inferiorly on the left of 3 x 3 x 4 mm.  IMPRESSION:  The thyroid gland is within normal limits in size with only small solid nodules present,  the largest of only 8 mm in diameter on the right.    ? Trochanteric bursitis of left hip   ? 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   ? Trochanteric bursitis, right hip 04/26/2020  ? Tubular adenoma of colon 01/28/2021  ? Colonoscopy screening 4 mm tubular Adenoma polyp Wilford Corner, MD [facility])  ? Tubular adenoma of colon 01/28/2021  ? Colonoscopy screening 4 mm tubular Adenoma polyp Wilford Corner, MD [facility])  ? Vasomotor symptoms due to menopause 04/19/2017  ? Vitamin D deficiency 05/08/2017  ? Vulvitis 07/22/2020  ? Yeast vaginitis 07/11/2019  ? ?Past Surgical History:  ?Procedure Laterality Date  ? Bladder dilitation    ? x 3  ? BREAST BIOPSY Right 2011  ? Benign histology  ? CARDIOVASCULAR STRESS TEST  2000  ? Unremarkable per pt report  ? CARPOMETACARPAL JOINT ARTHROTOMY Right 2011  ? COLONOSCOPY    ? COLONOSCOPY WITH PROPOFOL N/A 04/21/2015  ? Procedure: COLONOSCOPY WITH PROPOFOL;  Surgeon: Carol Ada, MD;  Location: [facility];  Service: Endoscopy;  Laterality: N/A;  ? CYSTOSCOPY W/ DILATION OF BLADDER N/A   ? EPIDURAL BLOCK INJECTION Left 04/12/2016  ? Left Medial Nerve Block and Left L5 ramus block, Dr Suella Broad   ? EPIDURAL BLOCK INJECTION  03/21/2016  ? Left L3-4 medial branch block and Left L5 & dorsal ramus block   ? EPIDURAL BLOCK INJECTION N/A 10/25/2016  ? Suella Broad, MD. Lumbar medial branch block  ? EPIDURAL BLOCK INJECTION N/A 02/09/2017  ? Suella Broad, MD.  Bilateral L3/4 medial branch block, bilateral L5 dorsal ramus block  ? EPIDURAL BLOCK INJECTION N/A 07/04/2017  ? Suella Broad, MD  ? FECAL TRANSPLANT  04/21/2015  ? Procedure: FECAL TRANSPLANT;  Surgeon: Carol Ada, MD;  Location: [facility];  Service: Endoscopy;;  ? HIP ARTHROPLASTY Left   ? HIP ARTHROSCOPY Left 03/06/2018  ? Left hip arthroplasty, redo for loose hip arthroplasty. Procedure at [facility]  ? [place] Left 11/2015  ? for OA by Dr Suella Broad  ? INTERSTIM IMPLANT PLACEMENT  04/2021  ? Pia Mau MD ([facility])  ? INTERSTIM IMPLANT REVISION N/A 09/2021  ? Pia Mau MD ([facility])  ? OTHER SURGICAL HISTORY Left 2016  ? L

## 2022-01-24 ENCOUNTER — Encounter: Payer: Self-pay | Admitting: Family Medicine

## 2022-01-24 NOTE — Progress Notes (Signed)
Office visit [facility] 01/05/22 ?Colonoscopy 01/2021 by Dr Michail Sermon showed adenomatous polyp - rpt c-scopy in 5 yrs.  ? ?Recommend- ?- Cont famotidine 40 mg bid ?Continu Align capsules daily ?Cont Dicyclomine 10 mg three times a day prn cramps ?Continue Benefiber daily ?- Info on FODMAP diet provided ? ?

## 2022-01-30 ENCOUNTER — Ambulatory Visit: Payer: Medicare Other

## 2022-01-30 DIAGNOSIS — R278 Other lack of coordination: Secondary | ICD-10-CM

## 2022-01-30 DIAGNOSIS — M6281 Muscle weakness (generalized): Secondary | ICD-10-CM

## 2022-01-30 DIAGNOSIS — M62838 Other muscle spasm: Secondary | ICD-10-CM | POA: Diagnosis not present

## 2022-01-30 NOTE — Therapy (Signed)
OUTPATIENT PHYSICAL THERAPY TREATMENT NOTE   Patient Name: Molly Giles MRN: [id number] DOB:Dec 13, 1955, 66 y.o., female Today's Date: 01/30/2022  PCP: McDiarmid, Blane Ohara, MD REFERRING PROVIDER: McDiarmid, Blane Ohara, MD   PT End of Session - 01/30/22 1232     Visit Number 16    Date for PT Re-Evaluation 02/23/22    Authorization Type Medicare    Progress Note Due on Visit 20    PT Start Time 35    PT Stop Time 1310    PT Time Calculation (min) 40 min    Activity Tolerance Patient tolerated treatment well    Behavior During Therapy [facility] for tasks assessed/performed                Past Medical History:  Diagnosis Date   Abdominal bloating 05/10/2021   Acute gastritis 11/19/2020   Allergic rhinoconjunctivitis 07/02/2015   Anal fissure 11/19/2020   Arthritis    Benign positional vertigo 04/2015   Responded well to [facility]   Bruxism (teeth grinding)    Chronic contact dermatitis 08/06/2018   Per allergist Dr Pablo Lawrence.    Chronic migraine 02/25/2016   Per Dr Jaynee Eagles review in notes from [facility] from September 2015. Showed total headache days last month 18. Severe headache days 7 days. Moderate headache days 5 days. Mild headache days last month sick days. Days without headache last month 10 days. Symptoms associated with photophobia, phonophobia, osmophobia, neck pain, dizziness, jaw pain, nasal congestion, vision disturbances, tingling and numbness, weakness and worsening with activity. Each headache attack last 3 hours depending on treatment in severity. Left side, the right side, easier side, the frontal area in the back of the head. Characterized as throbbing, pressure, tightness, squeezing, stabbing and burning    Chronic migraine without aura 05/15/2013    Dr Jaynee Eagles [facility]   Chronic tonsillitis 01/27/2019   DDD (degenerative disc disease), cervical 11/18/2018   DDD (degenerative disc disease), lumbar     Depression    Disc displacement, lumbar    Dysrhythmia    seen by dr Wynonia Lawman- not a problem since she has been on Bystolic   Episodic cluster headache, not intractable 03/06/2017   Essential hypertension 05/15/2013   Family history of adverse reaction to anesthesia    Brother- N/V   Family history of premature CAD 05/15/2013   Fibromyalgia syndrome 07/01/2015   Management by Dr Chauncey Cruel. Devashwar (Rheum)    GERD (gastroesophageal reflux disease) 12/16/2014   H/O seasonal allergies    Hammer toe    Left great toe   Hashimoto's thyroiditis    Per patient, diagnosed by Dr. Modena Nunnery   Hearing loss of both ears 07/27/2015   mild to borderline moderate low frequency hearing loss improving to within normal limits bilaterally on audiology testing at [facility] in November 2016.     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   Hyperlipidemia 1998   Hypokalemia due to excessive gastrointestinal loss of potassium 07/28/2019   Hypothyroidism    Impairment of balance 02/2015   Consequent of postconcussive syndrome   Injury of triangular fibrocartilage complex of left wrist 02/25/2019   Dx 02/25/19 Iran Planas IV MD (  [facility])   Insulin resistance 07/04/2017   Internal hemorrhoid 01/28/2021   Internal hemorrhoid seen on colonoscopy 10/2020 Warnell Bureau MD [facility])   Interstitial cystitis    Irritable bowel syndrome with diarrhea 04/03/2016   Left ventricular hypertrophy, mild 02/25/2016   ECHOcardiogram report 06/17/15 showing EF55-60%, mild LVH and G1DD    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[place]) 03/10/2018   [facility]   Lumbar facet joint pain    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    Mood disorder ([place])    Morbid obesity ([place]) 03/06/2017   Musculoskeletal neck pain 07/14/2015   Nocturnal hypoxemia 03/06/2017   Normal coronary arteries 05/14/2014   Obesity (BMI 30.0-34.9) 01/29/2017   Osteoarthritis of left hip 11/01/2015   MRI order by Dr Alvan Dame (ortho) 10/2015 showed significant arthritis of left hip joint with cystic changes in femoral head c/w osteoarthritis   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, lumbar region 10/30/2011   Other insomnia 11/09/2016   Overweight    Pain in joint of left shoulder 11/09/2016   Pain in joint, multiple sites 11/18/2018   Palpitations 05/15/2013   [facility] manages   Periodic limb movement sleep disorder 03/28/2017   Perirectal cyst 05/07/2016   Poison ivy dermatitis 03/24/2021   PONV (postoperative nausea and vomiting)    Positive ANA (antinuclear antibody) 11/18/2018   Post concussion syndrome 06/06/2015   Post concussive syndrome 07/14/2015   Ms Brothers's post-concussive syndrome manifesting in vertigo and headache, mood changes, poor balance, dizziness, and decreased concentration per Dr Jaynee Eagles at [facility].    Posterior vitreous detachment of right eye 2014   Rectal abscess 05/10/2021   S/P left THA, AA 07/25/2016   Shingles    Shortness of breath dyspnea    with exertion   Sicca syndrome ([place]) 11/18/2018   (+) ANA   Sleep walking and eating 03/06/2017   Snoring 03/06/2017   Spondylosis of lumbar region without myelopathy or radiculopathy 10/30/2011   TFC (triangular fibrocartilage complex) injury 02/25/2019   Thyroid nodule 08/11/2009   Findings: The thyroid gland is within normal limits in size.  The gland is diffusely inhomogeneous. A small solid nodule is noted in the lower pole  medially on the right of 7 x 6 x 8 mm. A small solid nodule is noted inferiorly on the left of 3 x 3 x 4 mm.  IMPRESSION:  The thyroid gland is within normal limits in size with only small solid nodules  present, the largest of only 8 mm in diameter on the right.     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    Trochanteric bursitis, right hip 04/26/2020   Tubular adenoma of colon 01/28/2021   Colonoscopy screening 4 mm tubular Adenoma polyp Wilford Corner, MD [facility])   Tubular adenoma of colon 01/28/2021   Colonoscopy screening 4 mm tubular Adenoma polyp Wilford Corner, MD [facility])   Vasomotor symptoms due to menopause 04/19/2017   Vitamin D deficiency 05/08/2017   Vulvitis 07/22/2020   Yeast vaginitis 07/11/2019   Past Surgical History:  Procedure Laterality Date   Bladder dilitation     x 3   BREAST BIOPSY Right 2011   Benign histology   CARDIOVASCULAR STRESS TEST  2000   Unremarkable per pt report   CARPOMETACARPAL JOINT ARTHROTOMY Right 2011   COLONOSCOPY     COLONOSCOPY WITH PROPOFOL N/A 04/21/2015   Procedure: COLONOSCOPY WITH PROPOFOL;  Surgeon: Saralyn Pilar  Benson Norway, MD;  Location: [facility];  Service: Endoscopy;  Laterality: N/A;   CYSTOSCOPY W/ DILATION OF BLADDER N/A    EPIDURAL BLOCK INJECTION Left 04/12/2016   Left Medial Nerve Block and Left L5 ramus block, Dr Suella Broad    EPIDURAL BLOCK INJECTION  03/21/2016   Left L3-4 medial branch block and Left L5 & dorsal ramus block    EPIDURAL BLOCK INJECTION N/A 10/25/2016   Suella Broad, MD. Lumbar medial branch block   EPIDURAL BLOCK INJECTION N/A 02/09/2017   Suella Broad, MD.  Bilateral L3/4 medial branch block, bilateral L5 dorsal ramus block   EPIDURAL BLOCK INJECTION N/A 07/04/2017   Suella Broad, MD   FECAL TRANSPLANT  04/21/2015   Procedure: FECAL TRANSPLANT;  Surgeon: Carol Ada, MD;  Location: [facility] ENDOSCOPY;  Service: Endoscopy;;   HIP ARTHROPLASTY Left    HIP ARTHROSCOPY Left 03/06/2018   Left hip arthroplasty, redo for loose hip arthroplasty. Procedure at [place] Left 11/2015   for OA by Dr  Charolett Bumpers IMPLANT PLACEMENT  04/2021   Pia Mau MD ([facility])   INTERSTIM IMPLANT REVISION N/A 09/2021   Pia Mau MD ([facility])   OTHER SURGICAL HISTORY Left 2016   Left L3/L4 medial nerve block and Left L5 Dorsal Ramus block Dr Mickel Duhamel   TOTAL HIP ARTHROPLASTY Left 07/25/2016   Procedure: LEFT TOTAL HIP ARTHROPLASTY ANTERIOR APPROACH;  Surgeon: Paralee Cancel, MD;  Location: [facility];  Service: Orthopedics;  Laterality: Left;   Patient Active Problem List   Diagnosis Date Noted   Arthrosis of hand 12/20/2021   History of Clostridioides difficile colitis, required fecal transplantation 12/20/2021   Mixed hyperlipidemia 12/20/2021   Odynophagia 12/20/2021   Perianal candidiasis 12/20/2021   Premature menopause 12/20/2021   Meibomian gland dysfunction (MGD) of both eyes 10/17/2021   Nuclear sclerosis of both eyes 10/17/2021   Epiretinal membrane (ERM) of left eye 10/17/2021   Dysphagia 10/11/2021   Sacroiliac inflammation ([place]) 08/18/2021   OAB (overactive bladder) 03/02/2021   Anterior to posterior tear of superior glenoid labrum of left shoulder 02/22/2021   Osteoarthritis of acromioclavicular joint 02/22/2021   Acute gastritis 11/19/2020   Anal fissure 11/19/2020   Hyperhidrosis, scalp, 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   Chronic low back pain 05/14/2019   Cervical spondylosis 11/18/2018   DDD (degenerative disc disease), lumbar 11/18/2018   Keratoconjunctivitis sicca ([place]) 11/18/2018   Chronic contact dermatitis 08/06/2018   Primary osteoarthritis of left knee 06/20/2018   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[place]) 11/29/2017   Vitamin D deficiency 05/08/2017   Periodic limb movement sleep disorder 03/28/2017   Other insomnia 11/09/2016   Abdominal discomfort, generalized 05/07/2016   Irritable bowel syndrome with diarrhea 04/03/2016   Left ventricular hypertrophy, mild 02/25/2016   Mood disorder ([place]) 07/02/2015   Allergic rhinoconjunctivitis  07/02/2015   Fibromyalgia syndrome 07/01/2015   Personal history of colonic polyps 07/01/2015   GERD (gastroesophageal reflux disease) 12/16/2014   Hypothyroidism 05/15/2013   Pure hypercholesterolemia 05/15/2013   Interstitial cystitis 05/15/2013   Chronic migraine without aura 05/15/2013    REFERRING DIAG: N31.8 - other neuromuscular dysfunction of bladder; N81.89 - Other female genital prolapse  THERAPY DIAG:  Other muscle spasm  Muscle weakness (generalized)  Other lack of coordination  PERTINENT HISTORY: Interstitial cystitis; 1 vaginal delivery with episiotomy  PRECAUTIONS: NA  SUBJECTIVE: Pt states that she has been doing very well overall. She has a little but  of pressure right now, but wonders if it is just due to being here in physical therapy today. She is also recovering from shoulder surgery.   SUBJECTIVE:                                                                                                                                                                                            SUBJECTIVE STATEMENT: Pt states that this is the first day she's driven since last week due to severe flare up of vertigo, but is finally feeling better. She states that she is having good improvements after stopping drinking propel. She looked into Propel label further and found that it has sucralose and aspartame in it. She has been using relevum and has found it very helpful and experienced less burning. Fluid intake: Yes: Pt cutting out propel now due to bladder irritation     Patient confirms identification and approves PT to assess pelvic floor and treatment Yes     PAIN:  Are you having pain? No     Subjective information from 10/25/21 eval:   PATIENT GOALS To decrease urinary urgency and bladder/pelvic pain   PERTINENT HISTORY:  Interstitial cystitis; 1 vaginal delivery with episiotomy Sexual abuse: No   BOWEL MOVEMENT Pain with bowel movement: No Type of bowel  movement:Frequency 1x/day no straining  typically- does have IBS Fully empty rectum: Yes: - Leakage: No Pads: No Fiber supplement: No   URINATION Pain with urination: Yes - sometimes Fully empty bladder: Yes: most of the time Stream: Strong Urgency: Yes: with burning Frequency: tries to wait every 3 hours Leakage:  no Pads: No   INTERCOURSE Pain with intercourse: Initial Penetration and During Penetration Ability to have vaginal penetration:  No Climax: not currently Marinoff Scale: 3/3   PREGNANCY Vaginal deliveries 1 Tearing Yes: episiotomy C-section deliveries 0 Currently pregnant No   OBJECTIVE 12/15/21:    COGNITION:            Overall cognitive status: Within functional limits for tasks assessed                  POSTURE:  Reduced lumbar lordosis and Bil hip extension   LUMBARAROM/PROM   A/PROM A/PROM  12/01/2021  Flexion 75%  Extension 10%  Right lateral flexion 75%  Left lateral flexion 75%  Right rotation 50%  Left rotation 50%   (Blank rows = not tested)     PELVIC MMT:   MMT   12/01/2021  Vaginal 3/5, endurance 5 seconds, 5 repetitions  Internal Anal Sphincter    External Anal Sphincter    Puborectalis    Diastasis  Recti    (Blank rows = not tested)         PALPATION:               Internal Pelvic Floor tenderness and restriction at posterior fourchette; increased sharpness and restriction bil peri-urethra and decreased urethral mobility; pain and restriction in Rt superficial pelvic floor; deep pelvic floor tension bil, Rt>Lt    TONE: High   PROLAPSE: Anterior vaginal wall grade 2   TODAY'S TREATMENT 01/30/22 Manual: Soft tissue mobilization: Scar tissue mobilization: Myofascial release: Spinal mobilization: Internal pelvic floor techniques: Dry needling: Neuromuscular re-education: Core retraining:  Core facilitation: Supine march 2 x 10 Resisted march/core isometric 2 x 10 Leg extensions 10x bil Form correction: Pelvic floor  contraction training: Down training: Exercises: Stretches/mobility: Lower trunk rotation 3 x 10 Seated side bend 10x bil Strengthening: Standing slow march 2 x 10 Supine clam shells, blue band 2 x 10 Therapeutic activities: Functional strengthening activities: Self-care:    TREATMENT 01/18/22: Manual: Soft tissue mobilization: Abdominal mobilization Scar tissue mobilization: Myofascial release: Lumbar paraspinals with instrument assisted techniques Bladder release Spinal mobilization: Internal pelvic floor techniques: Dry needling: Lumbar paraspinals/glutes Neuromuscular re-education: Core retraining:  Core facilitation: Form correction: Pelvic floor contraction training: Down training: Exercises: Stretches/mobility: Strengthening: Therapeutic activities: Functional strengthening activities: Self-care:    TREATMENT 01/02/22 Manual: Soft tissue mobilization: Bil lower abdomen Scar tissue mobilization: Myofascial release: Instrument assisted soft tissue mobilization to bil adductors/medial hamstrings Bladder mobilization Spinal mobilization: Internal pelvic floor techniques: Dry needling: Neuromuscular re-education: Core retraining:  Core facilitation: Form correction: Pelvic floor contraction training: Down training: Exercises: Stretches/mobility: Strengthening: Therapeutic activities: Functional strengthening activities: Self-care: Decreasing decaf Healthy beverage options that may not act as bladder irritants      PATIENT EDUCATION:  Education details: Exercise progressions Person educated: Patient Education method: Explanation, Demonstration, Tactile cues, Verbal cues, and Handouts Education comprehension: verbalized understanding     HOME EXERCISE PROGRAM: [email or web address]    ASSESSMENT:   CLINICAL IMPRESSION: Pt overall doing very well with minimal bladder/abdominal pain and urgency. Due to this, emphasis of this treatment session placed on  strengthening exercises. She tolerated all core progressions very well with no increase in pain and appropriate challenge. We discussed the importance of breath coordination with exercises to help make sure there was not abnormal stress to pelvic floor. She will continue to benefit from skilled PT intervention in order to decrease pelvic pain and improve QOL.     OBJECTIVE IMPAIRMENTS decreased activity tolerance, decreased coordination, decreased endurance, decreased mobility, decreased strength, hypomobility, increased fascial restrictions, increased muscle spasms, impaired flexibility, and pain.    ACTIVITY LIMITATIONS community activity.    PERSONAL FACTORS 1 comorbidity: Interstitial cystitis  are also affecting patient's functional outcome.      REHAB POTENTIAL: Good   CLINICAL DECISION MAKING: Stable/uncomplicated   EVALUATION COMPLEXITY: Low     GOALS: Goals reviewed with patient? Yes   SHORT TERM GOALS: Target date: 11/22/2021 (Reviewed 12/15/21)   Pt will be independent with HEP. Baseline:  Goal status: MET   2.  Pt will be able to correctly perform diaphragmatic breathing and appropriate pressure management in order to prevent worsening vaginal wall laxity and improve pelvic floor A/ROM.  Baseline:  Goal status: IN PROGRESS   3.  Pt will be independent and provide teach back of urge suppression technique and in bladder retraining techniques in order to help decrease urinary frequency/urgency. Baseline:  Goal status: MET       LONG TERM GOALS:  Target date: 01/03/2022 (Reviewed 12/15/21)   Pt will be independent with advanced HEP.  Baseline: Been able to begin incorporating gentle strengthening and lumbar mobility Goal status: IN PROGRESS   2.  Pt will demonstrate normal pelvic floor muscle tone and A/ROM, able to achieve 4/5 strength with contractions and 10 sec endurance, in order to provide appropriate lumbopelvic support in functional activities and decrease urinary  symptoms.  Baseline/notes: Making good improvements in A/ROM and improved relaxation  Goal status: IN PROGRESS   3.  Pt will report 0/10 pain with vaginal penetration in order to improve intimate relationship with partner.  Baseline:  Goal status: IN PROGRESS       PLAN: PT FREQUENCY: 1x/week   PT DURATION: 10 weeks   PLANNED INTERVENTIONS: Therapeutic exercises, Therapeutic activity, Neuromuscular re-education, Balance training, Gait training, Patient/Family education, Joint mobilization, Dry Needling, Biofeedback, and Manual therapy   PLAN FOR NEXT SESSION: If patient continues to feel good, discuss D/C. Progress strengthening.    Heather Roberts, PT, DPT05/22/231:21 PM

## 2022-01-31 NOTE — Patient Instructions (Signed)
    SHINGLES VACCINATION  Chickenpox and shingles are related because they are caused by the same virus (varicella-zoster virus). After a person recovers from chickenpox, the virus stays dormant (inactive) in the body. It can reactivate years later and cause shingles.  CDC recommends that adults 50 years and older get two doses of the shingles vaccine called Shingrix (recombinant zoster vaccine) to prevent shingles and the complications from the disease.  Shingrix vaccination provides strong protection against shingles. In adults 50 years Shingrix is more than 90% effective at preventing shingles.  Studies show that Shingrix is safe. The vaccine helps your body create a strong defense against shingles. As a result, you are likely to have temporary side effects from getting the shots. Some people feel they are having a mild flu without the head cold symptoms.  The side effects might affect your ability to do normal daily activities for 2 to 3 days.  Getting the shot when you have a couple days without commitments is a good idea, in case you do feel under the weather.  Medicare prescription drug plans (Part D) usually cover all available vaccines needed to prevent illness, like the shingles (Shingrix) shot. Contact your Medicare drug plan If you are uncertain if they will cover the shingles vaccination.   Your pharmacist can give you Shingrix as a shot in your upper arm. You will need two Shingrix vaccinations to complete your inoculation against shingles.  The second injection is given at least 2 months after the first Shingrix vaccination was given.   

## 2022-02-02 ENCOUNTER — Encounter: Payer: Self-pay | Admitting: Family Medicine

## 2022-02-02 ENCOUNTER — Ambulatory Visit (INDEPENDENT_AMBULATORY_CARE_PROVIDER_SITE_OTHER): Payer: Medicare Other | Admitting: Family Medicine

## 2022-02-02 DIAGNOSIS — G4709 Other insomnia: Secondary | ICD-10-CM | POA: Diagnosis not present

## 2022-02-02 DIAGNOSIS — M797 Fibromyalgia: Secondary | ICD-10-CM | POA: Diagnosis not present

## 2022-02-03 ENCOUNTER — Encounter: Payer: Self-pay | Admitting: Family Medicine

## 2022-02-03 NOTE — Assessment & Plan Note (Signed)
Established problem Well Controlled and is at goal of ability to perform iADLs and ADLs. No signs of complications, medication side effects, or red flags. Molly Giles asked I take over prescription of her citalopram.  I will.

## 2022-02-03 NOTE — Assessment & Plan Note (Signed)
Established problem After Dr Casimiro Needle (Psych) prescribed lorazepam for Molly Giles, her rheumatologist refused to refill the patient's Ambien.  The rheumatologist snet in 5 tablets which patient was to break in half and take for sleep as a taper.  Molly Giles reached out to our office for assistance.  She was given this appointment, and I sent in a presciption of Ambien amount at 5 mg nightly to last until this appointment.  Molly Giles did not pick up this prescription (see PDMP) Molly Giles reports being on Ambien for many years. She reports inability to sleep with the Ambien 2.5 mg dose.   She contacted Dr Casimiro Needle who agreed to prescribe her Ambien.  He sent in a 30 tablets Ambien 5 mg which Molly Giles did pick up from her pharmacy.   We discussed Ambien to have high risk of falls and reduced reflexes associated with MVAs.  She understands the risks and benefit.  She has an appointment with Dr Casimiro Needle next moth.  She plans on discussing tapering of Ambien over a longer course.

## 2022-02-03 NOTE — Progress Notes (Signed)
Molly Giles is alone Sources of clinical information for visit is/are patient. Nursing assessment for this office visit was reviewed with the patient for accuracy and revision.     Previous Report(s) Reviewed: Telephone conversations with Rheumatology and Physical Therapy notes    02/02/2022    9:47 AM  Depression screen PHQ 2/9  Decreased Interest 1  Down, Depressed, Hopeless 1  PHQ - 2 Score 2  Altered sleeping 2  Tired, decreased energy 1  Change in appetite 2  Feeling bad or failure about yourself  1  Trouble concentrating 1  Moving slowly or fidgety/restless 0  Suicidal thoughts 0  PHQ-9 Score 9  Difficult doing work/chores Somewhat difficult   [place] Visit from 02/02/2022 in [place] Office Visit from 10/11/2021 in [place] Office Visit from 07/22/2020 in [place]  Thoughts that you would be better off dead, or of hurting yourself in some way Not at all Not at all Not at all  PHQ-9 Total Score '9 10 5          '$ 02/02/2022    9:48 AM 10/11/2021   10:19 AM 09/21/2021    3:20 PM 11/26/2019    2:01 PM 08/14/2017    3:00 PM  [place] in the past year? 0 0 0 0 No  Number falls in past yr: 0 0 0    Injury with Fall? 0 0 0    Risk for fall due to :   Orthopedic patient;Other (Comment)    Risk for fall due to: Comment   Dizziness    Follow up   Falls prevention discussed Falls evaluation completed        02/02/2022    9:47 AM 10/11/2021   10:18 AM 09/21/2021    3:16 PM  PHQ9 SCORE ONLY  PHQ-9 Total Score 9 10 0    Adult vaccines due  Topic Date Due   TETANUS/TDAP  09/13/2026    Health Maintenance Due  Topic Date Due   Zoster Vaccines- Shingrix (2 of 2) 10/07/2018   COLONOSCOPY (Pts 45-28yr Insurance coverage will need to be confirmed)  04/20/2020      History/P.E. limitations: none  Adult vaccines due  Topic Date Due   TETANUS/TDAP  09/13/2026   There are no  preventive care reminders to display for this patient.  Health Maintenance Due  Topic Date Due   Zoster Vaccines- Shingrix (2 of 2) 10/07/2018   COLONOSCOPY (Pts 45-455yrInsurance coverage will need to be confirmed)  04/20/2020     Chief Complaint  Patient presents with   Insomnia

## 2022-02-08 ENCOUNTER — Other Ambulatory Visit: Payer: Self-pay | Admitting: Family Medicine

## 2022-02-08 DIAGNOSIS — E038 Other specified hypothyroidism: Secondary | ICD-10-CM

## 2022-02-13 ENCOUNTER — Other Ambulatory Visit: Payer: Self-pay | Admitting: Family Medicine

## 2022-02-13 ENCOUNTER — Encounter: Payer: Self-pay | Admitting: Family Medicine

## 2022-02-13 DIAGNOSIS — E038 Other specified hypothyroidism: Secondary | ICD-10-CM

## 2022-02-13 NOTE — Progress Notes (Signed)
TSH future order

## 2022-02-14 ENCOUNTER — Other Ambulatory Visit: Payer: Medicare Other

## 2022-02-14 ENCOUNTER — Encounter: Payer: Self-pay | Admitting: *Deleted

## 2022-02-14 DIAGNOSIS — E038 Other specified hypothyroidism: Secondary | ICD-10-CM

## 2022-02-15 ENCOUNTER — Other Ambulatory Visit: Payer: Self-pay | Admitting: Family Medicine

## 2022-02-15 DIAGNOSIS — E038 Other specified hypothyroidism: Secondary | ICD-10-CM

## 2022-02-15 LAB — TSH: TSH: 4.28 u[IU]/mL (ref 0.450–4.500)

## 2022-02-15 MED ORDER — LEVOTHYROXINE SODIUM 125 MCG PO TABS: 62.5000 ug | ORAL_TABLET | Freq: Every day | ORAL | 3 refills | Status: DC

## 2022-02-19 ENCOUNTER — Other Ambulatory Visit: Payer: Self-pay | Admitting: Family Medicine

## 2022-03-02 ENCOUNTER — Ambulatory Visit: Payer: Medicare Other | Attending: Urology

## 2022-03-02 DIAGNOSIS — M62838 Other muscle spasm: Secondary | ICD-10-CM | POA: Diagnosis present

## 2022-03-02 DIAGNOSIS — R293 Abnormal posture: Secondary | ICD-10-CM | POA: Insufficient documentation

## 2022-03-02 DIAGNOSIS — M6281 Muscle weakness (generalized): Secondary | ICD-10-CM | POA: Insufficient documentation

## 2022-03-02 NOTE — Therapy (Signed)
OUTPATIENT PHYSICAL THERAPY TREATMENT NOTE   Patient Name: Molly Giles MRN: [id number] DOB:[birth year], 66 y.o., female Today's Date: 03/02/2022  PCP: McDiarmid, Blane Ohara, MD REFERRING PROVIDER: Christy Sartorius, MD   PT End of Session - 03/02/22 1143     Visit Number 17    Authorization Type Medicare    PT Start Time 1100    PT Stop Time 1142    PT Time Calculation (min) 42 min    Activity Tolerance Patient tolerated treatment well    Behavior During Therapy [facility] for tasks assessed/performed                 Past Medical History:  Diagnosis Date   Abdominal bloating 05/10/2021   Acute gastritis 11/19/2020   Allergic rhinoconjunctivitis 07/02/2015   Anal fissure 11/19/2020   Arthritis    Benign positional vertigo 04/2015   Responded well to [facility]   Bruxism (teeth grinding)    Chronic contact dermatitis 08/06/2018   Per allergist Dr Pablo Lawrence.    Chronic migraine 02/25/2016   Per Dr Jaynee Eagles review in notes from [facility] from September 2015. Showed total headache days last month 18. Severe headache days 7 days. Moderate headache days 5 days. Mild headache days last month sick days. Days without headache last month 10 days. Symptoms associated with photophobia, phonophobia, osmophobia, neck pain, dizziness, jaw pain, nasal congestion, vision disturbances, tingling and numbness, weakness and worsening with activity. Each headache attack last 3 hours depending on treatment in severity. Left side, the right side, easier side, the frontal area in the back of the head. Characterized as throbbing, pressure, tightness, squeezing, stabbing and burning    Chronic migraine without aura 05/15/2013    Dr Jaynee Eagles [facility]   Chronic tonsillitis 01/27/2019   DDD (degenerative disc disease), cervical 11/18/2018   DDD (degenerative disc disease), lumbar    Depression    Disc displacement, lumbar    Dysphagia 10/11/2021   Dysrhythmia     seen by dr Wynonia Lawman- not a problem since she has been on Bystolic   Episodic cluster headache, not intractable 03/06/2017   Essential hypertension 05/15/2013   Family history of adverse reaction to anesthesia    Brother- N/V   Family history of premature CAD 05/15/2013   Fibromyalgia syndrome 07/01/2015   Management by Dr Chauncey Cruel. Devashwar (Rheum)    GERD (gastroesophageal reflux disease) 12/16/2014   H/O seasonal allergies    Hammer toe    Left great toe   Hashimoto's thyroiditis    Per patient, diagnosed by Dr. Modena Nunnery   Hearing loss of both ears 07/27/2015   mild to borderline moderate low frequency hearing loss improving to within normal limits bilaterally on audiology testing at [facility] in November 2016.     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   Hyperlipidemia 1998   Hypokalemia due to excessive gastrointestinal loss of potassium 07/28/2019   Hypothyroidism    Impairment of balance 02/2015   Consequent of postconcussive syndrome   Injury of triangular fibrocartilage complex of left wrist 02/25/2019   Dx 02/25/19 Iran Planas IV MD ([facility])   Insulin resistance 07/04/2017   Internal hemorrhoid 01/28/2021  Internal hemorrhoid seen on colonoscopy 10/2020 Warnell Bureau MD [facility])   Interstitial cystitis    Irritable bowel syndrome with diarrhea 04/03/2016   Left ventricular hypertrophy, mild 02/25/2016   ECHOcardiogram report 06/17/15 showing EF55-60%, mild LVH and G1DD    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[place]) 03/10/2018   [facility]   Lumbar facet joint pain    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   Mood disorder ([place])    Morbid obesity ([place]) 03/06/2017    Musculoskeletal neck pain 07/14/2015   Nocturnal hypoxemia 03/06/2017   Normal coronary arteries 05/14/2014   Obesity (BMI 30.0-34.9) 01/29/2017   Odynophagia 12/20/2021   Osteoarthritis of left hip 11/01/2015   MRI order by Dr Alvan Dame (ortho) 10/2015 showed significant arthritis of left hip joint with cystic changes in femoral head c/w osteoarthritis   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, lumbar region 10/30/2011   Other insomnia 11/09/2016   Overweight    Pain in joint of left shoulder 11/09/2016   Pain in joint, multiple sites 11/18/2018   Palpitations 05/15/2013   [facility] manages   Perianal candidiasis 12/20/2021   Periodic limb movement sleep disorder 03/28/2017   Perirectal cyst 05/07/2016   Poison ivy dermatitis 03/24/2021   PONV (postoperative nausea and vomiting)    Positive ANA (antinuclear antibody) 11/18/2018   Post concussion syndrome 06/06/2015   Post concussive syndrome 07/14/2015   Ms Mccarney's post-concussive syndrome manifesting in vertigo and headache, mood changes, poor balance, dizziness, and decreased concentration per Dr Jaynee Eagles at [facility].    Posterior vitreous detachment of right eye 2014   Rectal abscess 05/10/2021   S/P left THA, AA 07/25/2016   Sacroiliac inflammation ([place]) 08/18/2021   Shingles    Shortness of breath dyspnea    with exertion   Sicca syndrome ([place]) 11/18/2018   (+) ANA   Sleep walking and eating 03/06/2017   Snoring 03/06/2017   Spondylosis of lumbar region without myelopathy or radiculopathy 10/30/2011   TFC (triangular fibrocartilage complex) injury 02/25/2019   Thyroid nodule 08/11/2009   Findings: The thyroid gland is within normal limits in size.  The gland is diffusely inhomogeneous. A small solid nodule is noted in the lower pole  medially on the right of 7 x 6 x 8 mm. A small solid nodule is noted inferiorly on the left of 3 x 3 x 4 mm.  IMPRESSION:  The thyroid gland is within normal  limits in size with only small solid nodules present, the largest of only 8 mm in diameter on the right.     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    Trochanteric bursitis, right hip 04/26/2020   Tubular adenoma of colon 01/28/2021   Colonoscopy screening 4 mm tubular Adenoma polyp Wilford Corner, MD [facility])   Tubular adenoma of colon 01/28/2021   Colonoscopy screening 4 mm tubular Adenoma polyp Wilford Corner, MD [facility])   Vasomotor symptoms due to menopause 04/19/2017   Vitamin D deficiency 05/08/2017   Vulvitis 07/22/2020   Yeast vaginitis 07/11/2019   Past Surgical History:  Procedure Laterality Date   Bladder dilitation     x 3   BREAST BIOPSY Right 2011   Benign histology   CARDIOVASCULAR STRESS TEST  2000   Unremarkable per pt report   CARPOMETACARPAL JOINT ARTHROTOMY Right 2011   COLONOSCOPY     COLONOSCOPY WITH PROPOFOL N/A 04/21/2015   Procedure: COLONOSCOPY WITH PROPOFOL;  Surgeon: Carol Ada, MD;  Location: [facility] ENDOSCOPY;  Service: Endoscopy;  Laterality: N/A;   CYSTOSCOPY W/ DILATION OF BLADDER N/A    EPIDURAL BLOCK INJECTION Left 04/12/2016   Left Medial Nerve Block and Left L5 ramus block, Dr Suella Broad    EPIDURAL BLOCK INJECTION  03/21/2016   Left L3-4 medial branch block and Left L5 & dorsal ramus block    EPIDURAL BLOCK INJECTION N/A 10/25/2016   Suella Broad, MD. Lumbar medial branch block   EPIDURAL BLOCK INJECTION N/A 02/09/2017   Suella Broad, MD.  Bilateral L3/4 medial branch block, bilateral L5 dorsal ramus block   EPIDURAL BLOCK INJECTION N/A 07/04/2017   Suella Broad, MD   FECAL TRANSPLANT  04/21/2015   Procedure: FECAL TRANSPLANT;  Surgeon: Carol Ada, MD;  Location: [facility] ENDOSCOPY;  Service: Endoscopy;;   HIP ARTHROPLASTY Left    HIP ARTHROSCOPY Left 03/06/2018   Left hip arthroplasty, redo for loose hip arthroplasty. Procedure at [place] Left 11/2015   for OA by Dr Charolett Bumpers IMPLANT PLACEMENT  04/2021   Pia Mau MD ([facility])   INTERSTIM IMPLANT REVISION N/A 09/2021   Pia Mau MD ([facility])   OTHER SURGICAL HISTORY Left 2016   Left L3/L4 medial nerve block and Left L5 Dorsal Ramus block Dr Mickel Duhamel   TOTAL HIP ARTHROPLASTY Left 07/25/2016   Procedure: LEFT TOTAL HIP ARTHROPLASTY ANTERIOR APPROACH;  Surgeon: Paralee Cancel, MD;  Location: [facility]S;  Service: Orthopedics;  Laterality: Left;   Patient Active Problem List   Diagnosis Date Noted   Arthrosis of hand 12/20/2021   History of Clostridioides difficile colitis, required fecal transplantation 12/20/2021   Mixed hyperlipidemia 12/20/2021   Premature menopause 12/20/2021   Meibomian gland dysfunction (MGD) of both eyes 10/17/2021   Nuclear sclerosis of both eyes 10/17/2021   Epiretinal membrane (ERM) of left eye 10/17/2021   OAB (overactive bladder) 03/02/2021   Anterior to posterior tear of superior glenoid labrum of left shoulder 02/22/2021   Osteoarthritis of acromioclavicular joint 02/22/2021   Hyperhidrosis, scalp, 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   Chronic low back pain 05/14/2019   Cervical spondylosis 11/18/2018   DDD (degenerative disc disease), lumbar 11/18/2018   Keratoconjunctivitis sicca ([place]) 11/18/2018   Chronic contact dermatitis 08/06/2018   Primary osteoarthritis of left knee 06/20/2018   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[place]) 11/29/2017   Vitamin D deficiency 05/08/2017   Periodic limb movement sleep disorder 03/28/2017   Other insomnia 11/09/2016   Abdominal discomfort, generalized 05/07/2016   Irritable bowel syndrome with diarrhea 04/03/2016   Left ventricular hypertrophy, mild 02/25/2016   Mood disorder ([place]) 07/02/2015   Allergic rhinoconjunctivitis 07/02/2015   Fibromyalgia syndrome 07/01/2015   Personal history of colonic polyps 07/01/2015   GERD (gastroesophageal reflux disease) 12/16/2014    Hypothyroidism 05/15/2013   Pure hypercholesterolemia 05/15/2013   Interstitial cystitis 05/15/2013   Chronic migraine without aura 05/15/2013    REFERRING DIAG: N31.8 - other neuromuscular dysfunction of bladder; N81.89 - Other female genital prolapse  THERAPY DIAG:  Abnormal posture  Muscle weakness (generalized)  Other muscle spasm  PERTINENT HISTORY: Interstitial cystitis; 1 vaginal delivery with episiotomy  PRECAUTIONS: NA  SUBJECTIVE: Patients that overall she has been feeling much better. She had one flare up in the last month for 2 days. She was up more last night and had to take AZO in order to sleep. She has been continuing to perform HEP and  feels confident with everything. She is using balance pad regularly. She feels confident about discharge today.   SUBJECTIVE:                                                                                                                                                                                            SUBJECTIVE STATEMENT: Pt states that this is the first day she's driven since last week due to severe flare up of vertigo, but is finally feeling better. She states that she is having good improvements after stopping drinking propel. She looked into Propel label further and found that it has sucralose and aspartame in it. She has been using relevum and has found it very helpful and experienced less burning. Fluid intake: Yes: Pt cutting out propel now due to bladder irritation     Patient confirms identification and approves PT to assess pelvic floor and treatment Yes     PAIN:  Are you having pain? No     Subjective information from 10/25/21 eval:   PATIENT GOALS To decrease urinary urgency and bladder/pelvic pain   PERTINENT HISTORY:  Interstitial cystitis; 1 vaginal delivery with episiotomy Sexual abuse: No   BOWEL MOVEMENT Pain with bowel movement: No Type of bowel movement:Frequency 1x/day no straining   typically- does have IBS Fully empty rectum: Yes: - Leakage: No Pads: No Fiber supplement: No   URINATION Pain with urination: Yes - sometimes Fully empty bladder: Yes: most of the time Stream: Strong Urgency: Yes: with burning Frequency: tries to wait every 3 hours Leakage:  no Pads: No   INTERCOURSE Pain with intercourse: Initial Penetration and During Penetration Ability to have vaginal penetration:  No Climax: not currently Marinoff Scale: 3/3   PREGNANCY Vaginal deliveries 1 Tearing Yes: episiotomy C-section deliveries 0 Currently pregnant No   OBJECTIVE 03/02/22:  *See chart below for updated lumbar A/ROM Increased tension in Rt lumbar paraspinals and Bil adductors  12/15/21:    COGNITION:            Overall cognitive status: Within functional limits for tasks assessed                  POSTURE:  Reduced lumbar lordosis and Bil hip extension   LUMBARAROM/PROM   A/PROM A/PROM  12/01/2021 A/ROM 03/02/22  Flexion 75% 75%  Extension 10% 50%  Right lateral flexion 75% 100%  Left lateral flexion 75% 100%  Right rotation 50% 75%  Left rotation 50% 75%   (Blank rows = not tested)     PELVIC MMT:   MMT   12/01/2021  Vaginal 3/5, endurance 5 seconds, 5 repetitions  Internal Anal Sphincter    External Anal Sphincter    Puborectalis    Diastasis Recti    (Blank rows = not tested)         PALPATION:               Internal Pelvic Floor tenderness and restriction at posterior fourchette; increased sharpness and restriction bil peri-urethra and decreased urethral mobility; pain and restriction in Rt superficial pelvic floor; deep pelvic floor tension bil, Rt>Lt    TONE: High   PROLAPSE: Anterior vaginal wall grade 2   TODAY'S TREATMENT 03/02/22 (discharge) Manual: Soft tissue mobilization: Bil lumbar paraspinals Dry needling: Trigger Point Dry-Needling  Treatment instructions: Expect mild to moderate muscle soreness. S/S of pneumothorax if dry needled over  a lung field, and to seek immediate medical attention should they occur. Patient verbalized understanding of these instructions and education.  Patient Consent Given: Yes Education handout provided: Previously provided Muscles treated: Bil lumbar paraspinals/glutes Electrical stimulation performed: No Parameters: N/A Treatment response/outcome: significant twitch response   TREATMENT 01/30/22 Manual: Soft tissue mobilization: Scar tissue mobilization: Myofascial release: Spinal mobilization: Internal pelvic floor techniques: Dry needling: Neuromuscular re-education: Core retraining:  Core facilitation: Supine march 2 x 10 Resisted march/core isometric 2 x 10 Leg extensions 10x bil Form correction: Pelvic floor contraction training: Down training: Exercises: Stretches/mobility: Lower trunk rotation 3 x 10 Seated side bend 10x bil Strengthening: Standing slow march 2 x 10 Supine clam shells, blue band 2 x 10 Therapeutic activities: Functional strengthening activities: Self-care:    TREATMENT 01/18/22: Manual: Soft tissue mobilization: Abdominal mobilization Scar tissue mobilization: Myofascial release: Lumbar paraspinals with instrument assisted techniques Bladder release Spinal mobilization: Internal pelvic floor techniques: Dry needling: Lumbar paraspinals/glutes Neuromuscular re-education: Core retraining:  Core facilitation: Form correction: Pelvic floor contraction training: Down training: Exercises: Stretches/mobility: Strengthening: Therapeutic activities: Functional strengthening activities: Self-care:     PATIENT EDUCATION:  Education details: Exercise progressions Person educated: Patient Education method: Consulting civil engineer, Demonstration, Corporate treasurer cues, Verbal cues, and Handouts Education comprehension: verbalized understanding     HOME EXERCISE PROGRAM: [email or web address]    ASSESSMENT:   CLINICAL IMPRESSION: Patient overall has made excellent  progress with condition demonstrated by tolerable urgency, no normal urinary frequency, decrease in bladder pain and independent with pain management techniques. Her low back pain has also improved as she has been able to control pelvic tension and perform strengthening/mobility program. She tolerated treatment of manual techniques and DN to low back today with good improvements in discomfort. She has seen good improvements in lumbar A/ROM which are most likely helping with controlling bladder pain. Due to having made great progress towards goals she is prepared to D/C skilled PT intervention. She was encouraged to call with any questions/concerns.      OBJECTIVE IMPAIRMENTS decreased activity tolerance, decreased coordination, decreased endurance, decreased mobility, decreased strength, hypomobility, increased fascial restrictions, increased muscle spasms, impaired flexibility, and pain.    ACTIVITY LIMITATIONS community activity.    PERSONAL FACTORS 1 comorbidity: Interstitial cystitis  are also affecting patient's functional outcome.      REHAB POTENTIAL: Good   CLINICAL DECISION MAKING: Stable/uncomplicated   EVALUATION COMPLEXITY: Low     GOALS: Goals reviewed with patient? Yes   SHORT TERM GOALS: Target date: 11/22/2021 -updated 03/02/22   Pt will be independent with HEP. Baseline:  Goal status: MET   2.  Pt will be able to correctly perform diaphragmatic breathing and appropriate pressure management in order to prevent worsening vaginal wall laxity and improve pelvic  floor A/ROM.  Baseline:  Goal status: MET 03/02/22   3.  Pt will be independent and provide teach back of urge suppression technique and in bladder retraining techniques in order to help decrease urinary frequency/urgency. Baseline:  Goal status: MET       LONG TERM GOALS: Target date: 01/03/2022 (Reviewed 12/15/21)   Pt will be independent with advanced HEP.  Baseline: Been able to begin incorporating gentle  strengthening and lumbar mobility Goal status: MET 03/02/22   2.  Pt will demonstrate normal pelvic floor muscle tone and A/ROM, able to achieve 4/5 strength with contractions and 10 sec endurance, in order to provide appropriate lumbopelvic support in functional activities and decrease urinary symptoms.  Baseline/notes: Making good improvements in A/ROM and improved relaxation  Goal status: Discharge goal - not formally assessed today due to patient request to focus on other things   3.  Pt will report 0/10 pain with vaginal penetration in order to improve intimate relationship with partner.  Baseline: Patient has not attempted due to husband's difficulty with ED Goal status: Discharge goal 03/02/22       PLAN: PT FREQUENCY: -   PT DURATION: -   PLANNED INTERVENTIONS: -   PLAN FOR NEXT SESSION: DC  PHYSICAL THERAPY DISCHARGE SUMMARY  Visits from Start of Care: 17  Current functional level related to goals / functional outcomes: Independent - good progress   Remaining deficits: See above   Education / Equipment: HEP   Patient agrees to discharge. Patient goals were partially met. Patient is being discharged due to  having made great progress towards goals and being independent with HEP/pain management.  Heather Roberts, PT, DPT06/22/2311:45 AM

## 2022-03-21 ENCOUNTER — Ambulatory Visit (HOSPITAL_COMMUNITY): Payer: Medicare Other | Admitting: Psychiatry

## 2022-03-21 ENCOUNTER — Encounter: Payer: Self-pay | Admitting: Family Medicine

## 2022-03-29 ENCOUNTER — Other Ambulatory Visit: Payer: Self-pay | Admitting: Physician Assistant

## 2022-03-29 ENCOUNTER — Telehealth: Payer: Self-pay | Admitting: Family Medicine

## 2022-03-29 MED ORDER — EMGALITY 120 MG/ML ~~LOC~~ SOAJ: 120.0000 mg | Freq: Once | SUBCUTANEOUS | 0 refills | Status: AC

## 2022-03-29 NOTE — Telephone Encounter (Signed)
Next Visit: 06/28/2022   Last Visit: 12/27/2021   Last Fill: 12/27/2021  Dx: Fibromyalgia  Current Dose per office note on 12/27/2021: Celexa 20 mg 1 tablet by mouth daily.  Okay to refill Celexa?

## 2022-03-29 NOTE — Telephone Encounter (Signed)
Pt is calling because the price of her Galcanezumab-gnlm (EMGALITY) 120 MG/ML SOAJ went way up and Pt said Amy would give her samples next times this happen if she have some, so Pt wanted to know if Amy could give her some samples Galcanezumab-gnlm (EMGALITY) 120 MG/ML SOAJ.

## 2022-03-29 NOTE — Telephone Encounter (Signed)
We have one we can provide to the patient

## 2022-03-29 NOTE — Addendum Note (Signed)
Addended by: Darleen Crocker on: 03/29/2022 08:42 AM   Modules accepted: Orders

## 2022-03-29 NOTE — Telephone Encounter (Signed)
Called the pt back and made her aware that there is one sample for her. Pt verbalized understanding and was appreciative.

## 2022-04-10 ENCOUNTER — Ambulatory Visit
Admission: RE | Admit: 2022-04-10 | Discharge: 2022-04-10 | Disposition: A | Discharge status: Home / Self Care | Payer: Medicare Other | Source: Ambulatory Visit | Attending: Family Medicine | Admitting: Family Medicine

## 2022-04-10 ENCOUNTER — Ambulatory Visit (INDEPENDENT_AMBULATORY_CARE_PROVIDER_SITE_OTHER): Payer: Medicare Other | Admitting: Family Medicine

## 2022-04-10 ENCOUNTER — Encounter: Payer: Self-pay | Admitting: Family Medicine

## 2022-04-10 VITALS — BP 132/78 | HR 113 | Ht 66.0 in | Wt 199.0 lb

## 2022-04-10 DIAGNOSIS — J988 Other specified respiratory disorders: Secondary | ICD-10-CM | POA: Diagnosis not present

## 2022-04-10 DIAGNOSIS — R051 Acute cough: Secondary | ICD-10-CM

## 2022-04-10 NOTE — Patient Instructions (Signed)
It was wonderful to see you today.  Please bring ALL of your medications with you to every visit.   Today we talked about:  I recommend pushing fluids (water!)  Start afrin  Take Tylenol 650 mg three times a day for the next two days  Call me on Wednesday if you are NOT better and we will discuss antibiotics for your sinuses  I will message you with results  An x-ray was ordered for you---you do not need an appointment to have this completed.  I recommend going to [place]   If the results are normal,I will send you a letter  I will call you with results if anything is abnormal    Thank you for choosing [facility].   Please call [phone] with any questions about today's appointment.  Please be sure to schedule follow up at the front  desk before you leave today.   Dorris Singh, MD  Family Medicine

## 2022-04-10 NOTE — Progress Notes (Signed)
    SUBJECTIVE:   CHIEF COMPLAINT: sinus/throat issues HPI:   Molly Giles is a 66 y.o.  with history notable for migraines and hypothyroidism presenting for concern for strep/sinus infection. She reports four days of feeling unwell. She saw her grandkids last week who were getting over a cold (granddaughter- specifically). Then had onset of pressure in her head, right sided neck pain, cough, congestion. Has tried nasal spray without relief. Had a migraine over the weekend requiring therapy. No chest pain, feels herself catching her breath but no chest pain. Also has had diarrhea, non-bloody. Took a home COVID test which was negative   PERTINENT  PMH / PSH/Family/Social History : history of C. Diff with Augementin, migraines on injectable therapy, suspected IC, hypothyroidism   OBJECTIVE:   BP 132/78   Pulse (!) 113   Ht '5\' 6"'$  (1.676 m)   Wt 199 lb (90.3 kg)   SpO2 99%   BMI 32.12 kg/m   Today's weight:  Last Weight  Most recent update: 04/10/2022  9:57 AM    Weight  90.3 kg (199 lb)            Review of prior weights: [employer]   04/10/22 0957  Weight: 199 lb (90.3 kg)   HEENT MMM TM retracted on R + TTP along L maxillary sinus Oropharynx benign No LAD  Cardiac: Regular rate and rhythm. HR 98 on exam Normal S1/S2. No murmurs, rubs, or gallops appreciated. Lungs: Clear bilaterally to ascultation.  Abdomen is soft with mild LLQ pain no rebound or guarding  Psych: Pleasant and appropriate    ASSESSMENT/PLAN:   Respiratory infection, primarily upper respiratory but given age and risk factors, will obtain CXR. Day 4 of symptoms, largely suspect this is viral based upon trajectory, exposure. No prolonged duration or drainage suggestive of bacterial cause. Low suspicion for Strep by age, symptoms. Suspect HR elevation due to dehydration--has been eating and drinking less - Push fluids - Tylenol TID - Afrin for 3 days  - COVID Test  - She is to call if symptoms  worsen or do not improve--would treat for bacterial sinusitis        Dorris Singh, MD  [place]

## 2022-04-11 ENCOUNTER — Encounter: Payer: Self-pay | Admitting: Family Medicine

## 2022-04-11 ENCOUNTER — Ambulatory Visit (HOSPITAL_BASED_OUTPATIENT_CLINIC_OR_DEPARTMENT_OTHER): Payer: Medicare Other | Admitting: Psychiatry

## 2022-04-11 DIAGNOSIS — F4323 Adjustment disorder with mixed anxiety and depressed mood: Secondary | ICD-10-CM | POA: Diagnosis not present

## 2022-04-11 LAB — NOVEL CORONAVIRUS, NAA: SARS-CoV-2, NAA: NOT DETECTED

## 2022-04-11 MED ORDER — LORAZEPAM 0.5 MG PO TABS
ORAL_TABLET | ORAL | 3 refills | Status: DC
Start: 2022-04-11 — End: 2022-07-12

## 2022-04-11 MED ORDER — ZOLPIDEM TARTRATE 5 MG PO TABS
ORAL_TABLET | ORAL | 4 refills | Status: DC
Start: 2022-04-11 — End: 2022-07-12
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
## 2022-04-17 ENCOUNTER — Encounter: Payer: Self-pay | Admitting: Family Medicine

## 2022-04-17 DIAGNOSIS — B9689 Other specified bacterial agents as the cause of diseases classified elsewhere: Secondary | ICD-10-CM

## 2022-04-18 MED ORDER — AMOXICILLIN-POT CLAVULANATE 875-125 MG PO TABS: 1.0000 | ORAL_TABLET | Freq: Two times a day (BID) | ORAL | 0 refills | Status: AC

## 2022-05-08 DIAGNOSIS — G8929 Other chronic pain: Secondary | ICD-10-CM | POA: Insufficient documentation

## 2022-05-08 DIAGNOSIS — M7072 Other bursitis of hip, left hip: Secondary | ICD-10-CM | POA: Insufficient documentation

## 2022-05-08 HISTORY — DX: Other bursitis of hip, left hip: M70.72

## 2022-05-09 ENCOUNTER — Other Ambulatory Visit: Payer: Self-pay | Admitting: Family Medicine

## 2022-05-09 DIAGNOSIS — Z1231 Encounter for screening mammogram for malignant neoplasm of breast: Secondary | ICD-10-CM

## 2022-05-12 ENCOUNTER — Ambulatory Visit
Admission: RE | Admit: 2022-05-12 | Discharge: 2022-05-12 | Disposition: A | Discharge status: Home / Self Care | Payer: Medicare Other | Source: Ambulatory Visit | Attending: Family Medicine | Admitting: Family Medicine

## 2022-05-12 DIAGNOSIS — Z1231 Encounter for screening mammogram for malignant neoplasm of breast: Secondary | ICD-10-CM

## 2022-05-29 ENCOUNTER — Telehealth: Payer: Self-pay | Admitting: Family Medicine

## 2022-05-29 NOTE — Telephone Encounter (Signed)
Called and spoke w/ pt. She is in donut hole w/ insurance and cost of Emgality unaffordable right now. She will pick up 1 sample box of Emgality '120mg'$  tomorrow morning. I set aside in fridge for pt.   Lot: [id number] Expiration: 10/09/2023.

## 2022-05-29 NOTE — Telephone Encounter (Signed)
Pt called and LVM asking to see if the provider has an Galcanezumab-gnlm (EMGALITY) 120 MG/ML SOAJ sample she can pick up. Please advise.

## 2022-06-24 ENCOUNTER — Other Ambulatory Visit: Payer: Self-pay | Admitting: Family Medicine

## 2022-06-24 DIAGNOSIS — N763 Subacute and chronic vulvitis: Secondary | ICD-10-CM

## 2022-06-28 ENCOUNTER — Ambulatory Visit: Payer: BLUE CROSS/BLUE SHIELD | Admitting: Rheumatology

## 2022-06-30 ENCOUNTER — Other Ambulatory Visit: Payer: Self-pay | Admitting: *Deleted

## 2022-06-30 MED ORDER — CITALOPRAM HYDROBROMIDE 20 MG PO TABS: 20.0000 mg | ORAL_TABLET | Freq: Every day | ORAL | 0 refills | Status: DC

## 2022-06-30 NOTE — Telephone Encounter (Signed)
Please schedule patient a follow up visit. Patient due October 2023. Thanks!  

## 2022-06-30 NOTE — Telephone Encounter (Signed)
LMOM for patient to call and schedule follow-up appointment.   °

## 2022-06-30 NOTE — Telephone Encounter (Signed)
Refill request received via fax from [facility] for Celexa   Next Visit: Due October 2023. Message sent to the front to schedule.    Last Visit: 12/27/2021   Last Fill: 03/29/2022   Dx: Fibromyalgia   Current Dose per office note on 12/27/2021: Celexa 20 mg 1 tablet by mouth daily.   Okay to refill Celexa?

## 2022-07-12 ENCOUNTER — Ambulatory Visit (HOSPITAL_BASED_OUTPATIENT_CLINIC_OR_DEPARTMENT_OTHER): Payer: Medicare Other | Admitting: Psychiatry

## 2022-07-12 DIAGNOSIS — F4323 Adjustment disorder with mixed anxiety and depressed mood: Secondary | ICD-10-CM | POA: Diagnosis not present

## 2022-07-12 MED ORDER — ZOLPIDEM TARTRATE 5 MG PO TABS
5.0000 mg | ORAL_TABLET | Freq: Every evening | ORAL | 4 refills | Status: DC | PRN
Start: 2022-07-12 — End: 2023-03-07

## 2022-07-12 MED ORDER — ZOLPIDEM TARTRATE 5 MG PO TABS: ORAL_TABLET | ORAL | 4 refills | Status: DC

## 2022-07-12 MED ORDER — LORAZEPAM 0.5 MG PO TABS: ORAL_TABLET | ORAL | 3 refills | Status: DC

## 2022-07-12 MED ORDER — BELSOMRA 20 MG PO TABS: 20.0000 mg | ORAL_TABLET | Freq: Every evening | ORAL | 4 refills | Status: DC | PRN

## 2022-07-12 NOTE — Progress Notes (Signed)
Psychiatric Initial Adult Assessment   Patient Identification: Molly Giles MRN:  [id number] Date of Evaluation:  07/12/2022 Referral Source:  Chief Complaint:   Visit Diagnosis: Adjustment disorder  History of Present Illness: Dr. Jamie Kato   Today the patient is at her baseline.  Her only complaint is that once again she is having great problems sleeping.  Initially when she started taking Ambien 5 mg he did not need any beneficial effect.  It helped her fall to sleep because no next-day sedation.  For a variety of reasons things to change.  Particularly her bladder seems to be a worsening problem that wakes her up.  Further there is an issue that she might have sleep apnea.  She attempted to have a sleep study done but could not fall asleep at all.  He is interested in getting home test for sleep apnea which is a great idea.  As soon as that is identified as her etiology that is CPAP treatment would be appropriate and hypnotics would be less little less likely to be used.  But for now I think the patient's problem with sleep has a direct effect on her anxiety and her wellbeing.  At this time we will continue the Ambien 5 mg and will add to it Belsomra 20 mg.  She continues to take a small dose of Ativan 0.5 mg at night and 0.5 mg if she needs it during the day which she never has had to use.  The best thing is the patient is involved in good therapy with a good therapist in her community.  She has ongoing issues with her daughter-in-law Judson Roch.  Apparently at some point since I have seen her she and her husband babysat for her grandchildren while Judson Roch was out of town.  Apparently their son did not tell the wife that they were babysitting.  Reports they found out and Judson Roch became outraged and is distancing herself even more from her in-laws.  The patient is very distressed by this.  Her son fortunately is not drinking anymore alcohol.  I think her son is in the middle of this a  lot. Associated Signs/Symptoms: Depression Symptoms:  anxiety, (Hypo) Manic Symptoms:   Anxiety Symptoms:  Excessive Worry, Psychotic Symptoms:  PTSD Symptoms: NA  Past Psychiatric History:   Previous Psychotropic Medications: Yes   Substance Abuse History in the last 12 months:  No.  Consequences of Substance Abuse: NA  Past Medical History:  Past Medical History:  Diagnosis Date   Abdominal bloating 05/10/2021   Acute gastritis 11/19/2020   Allergic rhinoconjunctivitis 07/02/2015   Anal fissure 11/19/2020   Arthritis    Benign positional vertigo 04/2015   Responded well to [facility]   Bruxism (teeth grinding)    Chronic contact dermatitis 08/06/2018   Per allergist Dr Pablo Lawrence.    Chronic migraine 02/25/2016   Per Dr Jaynee Eagles review in notes from [facility] from September 2015. Showed total headache days last month 18. Severe headache days 7 days. Moderate headache days 5 days. Mild headache days last month sick days. Days without headache last month 10 days. Symptoms associated with photophobia, phonophobia, osmophobia, neck pain, dizziness, jaw pain, nasal congestion, vision disturbances, tingling and numbness, weakness and worsening with activity. Each headache attack last 3 hours depending on treatment in severity. Left side, the right side, easier side, the frontal area in the back of the head. Characterized as throbbing, pressure, tightness, squeezing, stabbing and burning    Chronic  migraine without aura 05/15/2013    Dr Jaynee Eagles [facility]   Chronic tonsillitis 01/27/2019   DDD (degenerative disc disease), cervical 11/18/2018   DDD (degenerative disc disease), lumbar    Depression    Disc displacement, lumbar    Dysphagia 10/11/2021   Dysrhythmia    seen by dr Wynonia Lawman- not a problem since she has been on Bystolic   Episodic cluster headache, not intractable 03/06/2017   Essential hypertension 05/15/2013   Family history  of adverse reaction to anesthesia    Brother- N/V   Family history of premature CAD 05/15/2013   Fibromyalgia syndrome 07/01/2015   Management by Dr Chauncey Cruel. Devashwar (Rhe[facility])    GERD (gastroesophageal reflux disease) 12/16/2014   H/O seasonal allergies    Hammer toe    Left great toe   Hashimoto's thyroiditis    Per patient, diagnosed by Dr. Modena Nunnery   Hearing loss of both ears 07/27/2015   mild to borderline moderate low frequency hearing loss improving to within normal limits bilaterally on audiology testing at [facility] in November 2016.     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   Hyperlipidemia 1998   Hypokalemia due to excessive gastrointestinal loss of potassium 07/28/2019   Hypothyroidism    Impairment of balance 02/2015   Consequent of postconcussive syndrome   Injury of triangular fibrocartilage complex of left wrist 02/25/2019   Dx 02/25/19 Iran Planas IV MD ([facility])   Insulin resistance 07/04/2017   Internal hemorrhoid 01/28/2021   Internal hemorrhoid seen on colonoscopy 10/2020 Warnell Bureau MD [facility])   Interstitial cystitis    Irritable bowel syndrome with diarrhea 04/03/2016   Left ventricular hypertrophy, mild 02/25/2016   ECHOcardiogram report 06/17/15 showing EF55-60%, mild LVH and G1DD    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[place]) 03/10/2018   [facility]   Lumbar facet joint pain    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   Mood disorder ([place])    Morbid obesity ([place]) 03/06/2017   Musculoskeletal neck pain 07/14/2015   Nocturnal hypoxemia 03/06/2017   Normal coronary arteries 05/14/2014   Obesity (BMI 30.0-34.9) 01/29/2017   Odynophagia 12/20/2021    Osteoarthritis of left hip 11/01/2015   MRI order by Dr Alvan Dame (ortho) 10/2015 showed significant arthritis of left hip joint with cystic changes in femoral head c/w osteoarthritis   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, lumbar region 10/30/2011   Other insomnia 11/09/2016   Overweight    Pain in joint of left shoulder 11/09/2016   Pain in joint, multiple sites 11/18/2018   Palpitations 05/15/2013   [facility] manages   Perianal candidiasis 12/20/2021   Periodic limb movement sleep disorder 03/28/2017   Perirectal cyst 05/07/2016   Poison ivy dermatitis 03/24/2021   PONV (postoperative nausea and vomiting)    Positive ANA (antinuclear antibody) 11/18/2018   Post concussion syndrome 06/06/2015   Post concussive syndrome 07/14/2015   Ms Tribbey's post-concussive syndrome manifesting in vertigo and headache, mood changes, poor balance, dizziness, and decreased concentration per Dr Jaynee Eagles at [facility].    Posterior vitreous detachment of right eye 2014   Rectal abscess 05/10/2021   S/P left THA, AA 07/25/2016   Sacroiliac inflammation (  [place]) 08/18/2021   Shingles    Shortness of breath dyspnea    with exertion   Sicca syndrome ([place]) 11/18/2018   (+) ANA   Sleep walking and eating 03/06/2017   Snoring 03/06/2017   Spondylosis of lumbar region without myelopathy or radiculopathy 10/30/2011   TFC (triangular fibrocartilage complex) injury 02/25/2019   Thyroid nodule 08/11/2009   Findings: The thyroid gland is within normal limits in size.  The gland is diffusely inhomogeneous. A small solid nodule is noted in the lower pole  medially on the right of 7 x 6 x 8 mm. A small solid nodule is noted inferiorly on the left of 3 x 3 x 4 mm.  IMPRESSION:  The thyroid gland is within normal limits in size with only small solid nodules present, the largest of only 8 mm in diameter on the right.     Trochanteric bursitis of left hip    Osteoarthritis from left hip  dysplasia; mild dysplasia [place]we 1.    Trochanteric bursitis, right hip 04/26/2020   Tubular adenoma of colon 01/28/2021   Colonoscopy screening 4 mm tubular Adenoma polyp Wilford Corner, MD [facility])   Tubular adenoma of colon 01/28/2021   Colonoscopy screening 4 mm tubular Adenoma polyp Wilford Corner, MD [facility])   Vasomotor symptoms due to menopause 04/19/2017   Vitamin D deficiency 05/08/2017   Vulvitis 07/22/2020   Yeast vaginitis 07/11/2019    Past Surgical History:  Procedure Laterality Date   arthroscopy Left 01/2022   with SAD and DCR, K. Supple MD   Bladder dilitation     x 3   BREAST BIOPSY Right 2011   Benign histology   CARDIOVASCULAR STRESS TEST  2000   Unremarkable per pt report   CARPOMETACARPAL JOINT ARTHROTOMY Right 2011   COLONOSCOPY     COLONOSCOPY WITH PROPOFOL N/A 04/21/2015   Procedure: COLONOSCOPY WITH PROPOFOL;  Surgeon: Carol Ada, MD;  Location[facility] ENDOSCOPY;  Service: Endoscopy;  Laterality: N/A;   CYSTOSCOPY W/ DILATION OF BLADDER N/A    EPIDURAL BLOCK INJECTION Left 04/12/2016   Left Medial Nerve Block and Left L5 ramus block, Dr Suella Broad    EPIDURAL BLOCK INJECTION  03/21/2016   Left L3-4 medial branch block and Left L5 & dorsal ramus block    EPIDURAL BLOCK INJECTION N/A 10/25/2016   Suella Broad, MD. Lumbar medial branch block   EPIDURAL BLOCK INJECTION N/A 02/09/2017   Suella Broad, MD.  Bilateral L3/4 medial branch block, bilateral L5 dorsal ramus block   EPIDURAL BLOCK INJECTION N/A 07/04/2017   Suella Broad, MD   FECAL TRANSPLANT  04/21/2015   Procedure: FECAL TRANSPLANT;  Surgeon: Carol Ada, MD;  Location: [facility] ENDOSCOPY;  Service: Endoscopy;;   HIP ARTHROPLASTY Left    HIP ARTHROSCOPY Left 03/06/2018   Left hip arthroplasty, redo for loose hip arthroplasty. Procedure at [place] Left 11/2015   for OA by Dr Charolett Bumpers IMPLANT PLACEMENT  04/2021   Pia Mau MD  ([facility])   INTERSTIM IMPLANT REVISION N/A 09/2021   Pia Mau MD ([facility])   OTHER SURGICAL HISTORY Left 2016   Left L3/L4 medial nerve block and Left L5 Dorsal Ramus block Dr Mickel Duhamel   TOTAL HIP ARTHROPLASTY Left 07/25/2016   Procedure: LEFT TOTAL HIP ARTHROPLASTY ANTERIOR APPROACH;  Surgeon: Paralee Cancel, MD;  Location: [facility] ORS;  Service: Orthopedics;  Laterality: Left;    Family  Psychiatric History:   Family History:  Family History  Problem Relation Age of Onset   Alzheimer's disease Mother    Hyperlipidemia Mother    Hypertension Mother    Osteoporosis Mother    Parkinson's disease Mother    Migraines Sister    Allergies Sister    Hypertension Sister    Hyperlipidemia Brother    Cardiomyopathy Brother    Diabetes type II Brother    Kidney disease Brother    Asthma Brother    Heart disease Brother    Hypertension Brother    Hyperlipidemia Brother    Kidney disease Brother    Heart disease Brother    Heart disease Father    Hyperlipidemia Father    Hypertension Father    Aortic aneurysm Father    Early death Father 2   Diabetes type II Other    Breast cancer Maternal Aunt     Social History:   Social History   Socioeconomic History   Marital status: Married    Spouse name: Rush Landmark Rohleder   Number of children: 1   Years of education: 16   Highest education level: Bachelor's degree (e.g., BA, AB, BS)  Occupational History   Occupation: Psychologist, counselling: [employer]    Comment: Retired   Occupation: Retail buyer: [employer]    Comment: Part-time  Tobacco Use   Smoking status: Never    Passive exposure: Past   Smokeless tobacco: Never  Vaping Use   Vaping Use: Never used  Substance and Sexual Activity   Alcohol use: No    Alcohol/week: 0.0 standard drinks of alcohol    Comment: No use in 30 years.   Drug use: No   Sexual activity: Yes    Partners: Male    Birth control/protection:  None  Other Topics Concern   Not on file  Social History Narrative   Prior PCP With [facility] [place], [place].   Married, lives with Liberty, (b. 1954)   Son in [place]. (2) grandchildren.   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    Wears seatbelt usually   No religious beliefs affecting healthcare   No difficulty taking medications as directed.       Home has working smoke alarm   No home throw rugs   Does not have nonslip bathtub / shower    Has railings on all stairs   Home is free from [place].    Right-handed.      Best number to reach patient [phone] (M) as of 09/21/2021.    No regular exercise of 3 times a week for 30 minutes at a time      Matisse Roskelley has Scientist, physiological and a Doctor, general practice is husband, Sanora Cunanan [phone]      Caffeine: 2-3 cups coffee per day   Social Determinants of Health   Financial Resource Strain: Low Risk  (09/21/2021)   Overall Financial Resource Strain (CARDIA)    Difficulty of Paying Living Expenses: Not hard at all  Food Insecurity: No Food Insecurity (09/21/2021)   Hunger Vital Sign    Worried About Running Out of Food in the Last Year: Never true    Ran Out of Food in the Last Year: Never true  Transportation Needs: No Transportation Needs (09/21/2021)   PRAPARE - Transportation    Lack of  Transportation (Medical): No    Lack of Transportation (Non-Medical): No  Physical Activity: Insufficiently Active (09/21/2021)   Exercise Vital Sign    Days of Exercise per Week: 2 days    Minutes of Exercise per Session: 20 min  Stress: No Stress Concern Present (09/21/2021)   Oden    Feeling of Stress : Only a little  Social Connections: Moderately Isolated (09/21/2021)   Social Connection and Isolation Panel [NHANES]    Frequency of Communication with Friends and  Family: More than three times a week    Frequency of Social Gatherings with Friends and Family: More than three times a week    Attends Religious Services: Never    Marine scientist or Organizations: No    Attends Archivist Meetings: Never    Marital Status: Married    Additional Social History:   Allergies:   Allergies  Allergen Reactions   Sulfa Antibiotics Itching   Chlorhexidine Gluconate Other (See Comments) and Rash    Burning   Codeine Nausea Only    "head nausea"    Levofloxacin Other (See Comments)    Pain in arm and behind ankles. Pain in arm and behind ankles.    Lyrica [Pregabalin] Other (See Comments)    Dizziness, nausea   Methylprednisolone     Flushing of the face   Prednisone     Flushing of the face   Betadine [Povidone Iodine] Rash    burning   Latex Itching and Rash    burning   Wellbutrin [Bupropion] Anxiety    Metabolic Disorder Labs: Lab Results  Component Value Date   HGBA1C 5.2 06/26/2019   No results found for: "PROLACTIN" Lab Results  Component Value Date   CHOL 173 10/11/2021   TRIG 92 10/11/2021   HDL 52 10/11/2021   CHOLHDL 3.3 10/11/2021   VLDL 26 12/14/2014   LDLCALC 104 (H) 10/11/2021   LDLCALC 100 08/04/2020   Lab Results  Component Value Date   TSH 4.280 02/14/2022    Therapeutic Level Labs: No results found for: "LITHIUM" No results found for: "CBMZ" No results found for: "VALPROATE"  Current Medications: Current Outpatient Medications  Medication Sig Dispense Refill   Suvorexant (BELSOMRA) 20 MG TABS Take 20 mg by mouth at bedtime as needed. 30 tablet 4   cetirizine (ZYRTEC) 10 MG tablet Take 10 mg by mouth daily as needed.     cholecalciferol (VITAMIN D3) 25 MCG (1000 UNIT) tablet Take 1,000 Units by mouth daily.     citalopram (CELEXA) 20 MG tablet Take 1 tablet (20 mg total) by mouth daily. 90 tablet 0   dicyclomine (BENTYL) 10 MG capsule Take 1 capsule (10 mg total) by mouth 3 (three) times  daily as needed. 90 capsule PRN   estradiol (ESTRACE) 0.1 MG/GM vaginal cream Apply one half gram (pea-sized amount) on fingertip and place intravaginally three times weekly.     famotidine (PEPCID) 40 MG tablet 1 tablet     Galcanezumab-gnlm (EMGALITY) 120 MG/ML SOAJ Inject 120 mg into the skin every 30 (thirty) days. 3 mL 3   levothyroxine (SYNTHROID) 125 MCG tablet Take 0.5 tablets (62.5 mcg total) by mouth daily before breakfast. 45 tablet 3   LORazepam (ATIVAN) 0.5 MG tablet 1 qhs for 4 days then 1 bid 60 tablet 3   meloxicam (MOBIC) 15 MG tablet Take by mouth as needed.     methocarbamol (ROBAXIN) 500 MG tablet Take  500 mg by mouth as needed.      NONFORMULARY OR COMPOUNDED ITEM daily. ALBUMIN eye drops- compounded     nystatin-triamcinolone (MYCOLOG II) cream APPLY TOPICALLY TO THE AFFECTED AREA TWICE DAILY 30 g 0   Phenazopyridine HCl (AZO URINARY PAIN PO) Take by mouth.     Probiotic Product (ALIGN) 4 MG CAPS See admin instructions.     rizatriptan (MAXALT) 10 MG tablet Take 1 tablet (10 mg total) by mouth as needed for migraine. Take 1 tablet at onset of migraine. May repeat in 2 hours if needed. (Do not exceed more than 2 tab in 24 hours) 10 tablet 11   rosuvastatin (CRESTOR) 10 MG tablet TAKE 1 TABLET(10 MG) BY MOUTH DAILY 90 tablet 3   SYSTANE BALANCE 0.6 % SOLN SMARTSIG:1 Drop(s) In Eye(s) PRN     traMADol (ULTRAM) 50 MG tablet Take 100 mg by mouth as needed.     VALIUM 10 MG tablet 10 mg at bedtime as needed.     zolpidem (AMBIEN) 5 MG tablet Take 1 tablet (5 mg total) by mouth at bedtime as needed for sleep. 30 tablet 4   zolpidem (AMBIEN) 5 MG tablet 1 qhs 30 tablet 4   No current facility-administered medications for this visit.    Musculoskeletal: Strength & Muscle Tone: within normal limits Gait & Station: normal Patient leans: Right  Psychiatric Specialty Exam: Review of Systems  There were no vitals taken for this visit.There is no height or weight on file to  calculate BMI.  General Appearance: Fairly Groomed  Eye Contact:  Good  Speech:  Clear and Coherent  Volume:  Normal  Mood:  Anxious  Affect:  Appropriate  Thought Process:  Coherent  Orientation:  Full (Time, Place, and Person)  Thought Content:  WDL  Suicidal Thoughts:  No  Homicidal Thoughts:  No  Memory:  NA  Judgement:  Good  Insight:  Good  Psychomotor Activity:  Normal  Concentration:  Concentration: Good  Recall:  Good  Fund of Knowledge:Good  Language: Good  Akathisia:  No  Handed:  Right  AIMS (if indicated):  not done  Assets:  Desire for Improvement  ADL's:  Intact  Cognition: WNL  Sleep:  Good   Screenings: PHQ2-9    [place] Office Visit from 04/10/2022 in [place] Office Visit from 02/02/2022 in [place] Office Visit from 10/11/2021 in [place] from 09/21/2021 in [place] Office Visit from 07/22/2020 in [place]  PHQ-2 Total Score '1 2 2 '$ 0 0  PHQ-9 Total Score '10 9 10 '$ -- 5       Assessment and Plan:    This patient's diagnosis is adjustment disorder with an anxious mood state.  She will continue taking Ativan 0.5 mg at night and 0.5 if she needs it during the day her second problem is insomnia.  This is likely related to multiple factors.  This includes the pain and spasm that comes in the middle of night that wakes her from her cystitis.  It also comes from the possibility that she has sleep apnea.  Nonetheless it is noted that the Ambien 5 mg did not work and she was delighted to hear that this is 7.5 mg pill.  She has been told by a variety of providers that Ambien is dangerous.  I told her that that simply was not true.  Further one of  her providers is concerned with the use of Ambien together with Ativan.  Again I do not think this is all that significant.  Further the use of Belsomra which is an entirely different  mechanism than either Ambien or Ativan I think would be the ideal alcohol.  It is my hope to try to get her on Belsomra and remove the Ambien.  She will be given 20 mg of Belsomra together with her Ambien and in 1 week she will discontinue her Ambien.  At this time I do not think the 7.5 mg dosage is available for her.  Therefore she will be taking a small dose of Ativan 0.5 mg at night Ambien 5 mg and Belsomra for approximately 1 week and then given that the Belsomra is effective she will discontinue the Ambien.  She will continue in one-to-one therapy and return to see me in 3 months.  Patient/Guardian was advised Release of Information must be obtained prior to any record release in order to collaborate their care with an outside provider. Patient/Guardian was advised if they have not already done so to contact the registration department to sign all necessary forms in order for [name] to release information regarding their care.   Consent: Patient/Guardian gives verbal consent for treatment and assignment of benefits for services provided during this visit. Patient/Guardian expressed understanding and agreed to proceed.   Jerral Ralph, MD 11/1/20233:50 PM

## 2022-07-31 DIAGNOSIS — M7741 Metatarsalgia, right foot: Secondary | ICD-10-CM | POA: Insufficient documentation

## 2022-07-31 DIAGNOSIS — M79671 Pain in right foot: Secondary | ICD-10-CM | POA: Insufficient documentation

## 2022-07-31 HISTORY — DX: Metatarsalgia, right foot: M77.41

## 2022-08-21 ENCOUNTER — Encounter: Payer: Self-pay | Admitting: Family Medicine

## 2022-08-21 ENCOUNTER — Telehealth: Payer: Self-pay

## 2022-08-21 DIAGNOSIS — U071 COVID-19: Secondary | ICD-10-CM

## 2022-08-21 NOTE — Telephone Encounter (Signed)
Patient calls nurse line reporting positive covid.   She reports she tested positive this morning. She reports symptoms started on Saturday evening. She reports headaches, cough, congestion, fevers and body aches.   She denies any SOB or nausea, vomiting or diarrhea.   Patient is requesting anti virals or "anything" that will help.  Conservative measures given to patient.   ED precautions given.   Will forward to PCP.

## 2022-08-22 MED ORDER — PAXLOVID (300/100) 20 X 150 MG & 10 X 100MG PO TBPK: ORAL_TABLET | ORAL | 0 refills | Status: DC

## 2022-08-23 ENCOUNTER — Encounter: Payer: Self-pay | Admitting: Family Medicine

## 2022-09-15 DIAGNOSIS — G5761 Lesion of plantar nerve, right lower limb: Secondary | ICD-10-CM

## 2022-09-15 DIAGNOSIS — M2021 Hallux rigidus, right foot: Secondary | ICD-10-CM | POA: Insufficient documentation

## 2022-09-15 HISTORY — DX: Lesion of plantar nerve, right lower limb: G57.61

## 2022-09-15 HISTORY — DX: Hallux rigidus, right foot: M20.21

## 2022-09-25 ENCOUNTER — Encounter: Payer: Self-pay | Admitting: Family Medicine

## 2022-09-25 DIAGNOSIS — G5761 Lesion of plantar nerve, right lower limb: Secondary | ICD-10-CM | POA: Insufficient documentation

## 2022-09-25 DIAGNOSIS — M2021 Hallux rigidus, right foot: Secondary | ICD-10-CM

## 2022-09-25 HISTORY — DX: Hallux rigidus, right foot: M20.21

## 2022-09-26 NOTE — Progress Notes (Signed)
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 11/2015   for OA by Dr Charolett Bumpers IMPLANT PLACEMENT  04/2021   Pia Mau MD ([facility])   INTERSTIM IMPLANT REVISION N/A 09/2021   Pia Mau MD ([facility])   OTHER SURGICAL HISTORY Left 2016   Left L3/L4 medial nerve block and Left L5 Dorsal Ramus block Dr Mickel Duhamel   TOTAL HIP ARTHROPLASTY Left 07/25/2016   Procedure: LEFT TOTAL HIP ARTHROPLASTY ANTERIOR APPROACH;  Surgeon: Paralee Cancel, MD;  Location: [facility];  Service: Orthopedics;  Laterality: Left;    FAMILY HISTORY: Family History  Problem Relation Age of Onset   Alzheimer's disease Mother    Hyperlipidemia Mother    Hypertension Mother    Osteoporosis Mother    Parkinson's disease Mother    Migraines Sister    Allergies Sister    Hypertension Sister    Hyperlipidemia Brother    Cardiomyopathy Brother    Diabetes type II Brother    Kidney disease Brother    Asthma Brother    Heart disease Brother    Hypertension Brother    Hyperlipidemia Brother    Kidney disease Brother    Heart disease Brother    Heart disease Father    Hyperlipidemia Father     Hypertension Father    Aortic aneurysm Father    Early death Father 49   Diabetes type II Other    Breast cancer Maternal Aunt     SOCIAL HISTORY: Social History   Socioeconomic History   Marital status: Married    Spouse name: Zamiya Dillard   Number of children: 1   Years of education: 16   Highest education level: Bachelor's degree (e.g., BA, AB, BS)  Occupational History   Occupation: Psychologist, counselling: [employer]    Comment: Retired   Occupation: Retail buyer: [employer]    Comment: Part-time  Tobacco Use   Smoking status: Never    Passive exposure: Past   Smokeless tobacco: Never  Vaping Use   Vaping Use: Never used  Substance and Sexual Activity   Alcohol use: No    Alcohol/week: 0.0 standard drinks of alcohol    Comment: No use in 30 years.   Drug use: No   Sexual activity: Yes    Partners: Male    Birth control/protection: None  Other Topics Concern   Not on file  Social History Narrative   Prior PCP With [facility] [place], [place].   Married, lives with [place], (b. 1954)   Son in [place]. (2) grandchildren.   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    Wears seatbelt usually   No religious beliefs affecting healthcare   No difficulty taking medications as directed.       Home has working smoke alarm   No home throw rugs   Does not have nonslip bathtub / shower    Has railings on all stairs   Home is free from [place].    Right-handed.      Best number to reach patient [phone] (M) as of 09/21/2021.    No regular exercise of 3 times a week for 30 minutes at a time      Molly Giles has Scientist, physiological and  a HCPOA agent   Emergency Contact is husband, Kiley Solimine [phone]      Caffeine: 2-3 cups coffee per day   Social Determinants of Health   Financial Resource Strain: Low Risk  (09/21/2021)   Overall Financial  Resource Strain (CARDIA)    Difficulty of Paying Living Expenses: Not hard at all  Food Insecurity: No Food Insecurity (09/21/2021)   Hunger Vital Sign    Worried About Running Out of Food in the Last Year: Never true    Ran Out of Food in the Last Year: Never true  Transportation Needs: No Transportation Needs (09/21/2021)   PRAPARE - Hydrologist (Medical): No    Lack of Transportation (Non-Medical): No  Physical Activity: Insufficiently Active (09/21/2021)   Exercise Vital Sign    Days of Exercise per Week: 2 days    Minutes of Exercise per Session: 20 min  Stress: No Stress Concern Present (09/21/2021)   [place]    Feeling of Stress : Only a little  Social Connections: Moderately Isolated (09/21/2021)   Social Connection and Isolation Panel [NHANES]    Frequency of Communication with Friends and Family: More than three times a week    Frequency of Social Gatherings with Friends and Family: More than three times a week    Attends Religious Services: Never    Marine scientist or Organizations: No    Attends Archivist Meetings: Never    Marital Status: Married  Human resources officer Violence: Not At Risk (09/21/2021)   Humiliation, Afraid, Rape, and Kick questionnaire    Fear of Current or Ex-Partner: No    Emotionally Abused: No    Physically Abused: No    Sexually Abused: No      PHYSICAL EXAM  Vitals:   09/27/22 1319  BP: (!) 133/99  Pulse: 98  Weight: 203 lb 8 oz (92.3 kg)  Height: '5\' 6"'$  (1.676 m)      Body mass index is 32.85 kg/m.  Repeat BP 140/88 manually in office.   Generalized: Well developed, in no acute distress  Cardiology: normal rate and rhythm, no murmur noted Respiratory: clear to auscultation bilaterally  Neurological examination  Mentation: Alert oriented to time, place, history taking. Follows all commands speech and language  fluent Cranial nerve II-XII: Pupils were equal round reactive to light. Extraocular movements were full, visual field were full  Motor: The motor testing reveals 5 over 5 strength of all 4 extremities. Good symmetric motor tone is noted throughout.  Gait and station: Gait is normal.    DIAGNOSTIC DATA (LABS, IMAGING, TESTING) - I reviewed patient records, labs, notes, testing and imaging myself where available.     02/25/2016   12:53 PM  MMSE - Mini Mental State Exam  Orientation to time comments Testing: Molly Giles named 18 different animal types in 50 seconds WNL     Lab Results  Component Value Date   WBC 6.4 08/04/2020   HGB 13.5 08/04/2020   HCT 41 08/04/2020   MCV 95.0 07/05/2019   PLT 262 08/04/2020      Component Value Date/Time   NA 140 10/11/2021 1142   K 4.4 10/11/2021 1142   CL 101 10/11/2021 1142   CO2 26 10/11/2021 1142   GLUCOSE 79 10/11/2021 1142   GLUCOSE 93 07/05/2019 1629   BUN 16 10/11/2021 1142   CREATININE 0.84 10/11/2021 1142   CREATININE 0.74 02/24/2016 1158  CALCIUM 9.8 10/11/2021 1142   PROT 7.1 02/12/2020 1056   ALBUMIN 4.4 08/04/2020 0000   ALBUMIN 4.7 02/12/2020 1056   AST 22 02/12/2020 1056   ALT 18 02/12/2020 1056   ALKPHOS 79 02/12/2020 1056   BILITOT 0.5 02/12/2020 1056   GFRNONAA 65 07/24/2019 1116   GFRNONAA 88 02/24/2016 1158   GFRAA 75 07/24/2019 1116   GFRAA >89 02/24/2016 1158   Lab Results  Component Value Date   CHOL 173 10/11/2021   HDL 52 10/11/2021   LDLCALC 104 (H) 10/11/2021   TRIG 92 10/11/2021   CHOLHDL 3.3 10/11/2021   Lab Results  Component Value Date   HGBA1C 5.2 06/26/2019   Lab Results  Component Value Date   [id number] 335 03/22/2017   Lab Results  Component Value Date   TSH 4.280 02/14/2022       ASSESSMENT AND PLAN 67 y.o. year old female  has a past medical history of Abdominal bloating (05/10/2021), Abdominal discomfort, generalized (05/07/2016), Acute gastritis (11/19/2020), Allergic  rhinoconjunctivitis (07/02/2015), Anal fissure (11/19/2020), Anterior to posterior tear of superior glenoid labrum of left shoulder (02/22/2021), Arthritis, Benign positional vertigo (04/2015), Bruxism (teeth grinding), Chronic contact dermatitis (08/06/2018), Chronic migraine (02/25/2016), Chronic migraine without aura (05/15/2013), Chronic tonsillitis (01/27/2019), DDD (degenerative disc disease), cervical (11/18/2018), DDD (degenerative disc disease), lumbar, Depression, Disc displacement, lumbar, Dysphagia (10/11/2021), Dysrhythmia, Episodic cluster headache, not intractable (03/06/2017), Essential hypertension (05/15/2013), Family history of adverse reaction to anesthesia, Family history of premature CAD (05/15/2013), Fibromyalgia syndrome (07/01/2015), GERD (gastroesophageal reflux disease) (12/16/2014), H/O seasonal allergies, Hallux rigidus of right foot (09/25/2022), Hammer toe, Hashimoto's thyroiditis, Hearing loss of both ears (07/27/2015), History of Clostridium difficile colitis (07/01/2015), History of colonic polyps, 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), History of revision of total hip arthroplasty (04/10/2018), bad fall (02/2015), Hyperhidrosis, scalp, 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), Hyperlipidemia (1998), Hypokalemia due to excessive gastrointestinal loss of potassium (07/28/2019), Hypothyroidism, Impairment of balance (02/2015), Injury of triangular fibrocartilage complex of left wrist (02/25/2019), Insulin resistance (07/04/2017), Internal hemorrhoid (01/28/2021), Interstitial cystitis, Irritable bowel syndrome with diarrhea (04/03/2016), Left ventricular hypertrophy, mild (02/25/2016), 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[place]) (03/10/2018), Lumbar facet joint pain, 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), Mood disorder ([place]), Morbid obesity ([place]) (03/06/2017), Musculoskeletal neck pain (07/14/2015), Nocturnal hypoxemia (03/06/2017), Normal coronary arteries (05/14/2014), Obesity (BMI 30.0-34.9)  (01/29/2017), Odynophagia (12/20/2021), Osteoarthritis of left hip (11/01/2015), 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, lumbar region (10/30/2011), Other insomnia (11/09/2016), Overweight, Pain in joint of left shoulder (11/09/2016), Pain in joint, multiple sites (11/18/2018), Palpitations (05/15/2013), Perianal candidiasis (12/20/2021), 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), Perirectal cyst (05/07/2016), Poison ivy dermatitis (03/24/2021), PONV (postoperative nausea and vomiting), Positive ANA (antinuclear antibody) (11/18/2018), Post concussion syndrome (06/06/2015), Post concussive syndrome (07/14/2015), Posterior vitreous detachment of right eye (2014), Premature menopause (12/20/2021), Rectal abscess (05/10/2021), S/P left THA, 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), Sacroiliac inflammation ([place]) (08/18/2021), Shingles, Shortness of breath dyspnea, 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 here with      ICD-10-CM   1. Migraine without aura and without status migrainosus, not intractable  G43.009 ketorolac (TORADOL) injection 60 mg    2. Snoring  R06.83 Ambulatory referral to Sleep Studies  3. Morning headache  R51.9     4. Chronic neck pain  M54.2    G89.29       Molly Giles has felt migraines were stable but notes significant worsening since being diagnosed with Covid 08/2022. She has an active migraine, today. I will give her Toradol '60mg'$  IM in the office. Advised to  avoid Nsaids for 24 hours. She also report needle phobia and wishes to try Qulipta. I will have her discontinue Emgality and start Qulipta '60mg'$  daily. We will switch rizatriptan back to eletriptan as this seems to work better. She will monitor for adverse effects. She was encouraged to follow up closely with Dr Nelva Bush. Neck etiology could very well contribute to headaches. She request a re evaluation with Dr Brett Fairy for concerns of sleep apnea. Last visit over 3 years ago and new referral placed. She will follow up closely with PCP for concerns of elevated BP. She will continue healthy lifestyle habits. She will follow-up with me in 1 year, sooner if needed.  She verbalizes understanding and agreement with this plan.   Orders Placed This Encounter  Procedures   Ambulatory referral to Sleep Studies    Referral Priority:   Routine    Referral Type:   Consultation    Referral Reason:   Specialty Services Required    Number of Visits Requested:   1      Meds ordered this encounter  Medications   ketorolac (TORADOL) injection 60 mg   eletriptan (RELPAX) 40 MG tablet    Sig: Take 1 tablet (40 mg total) by mouth as needed for migraine or headache. May repeat in 2 hours if headache persists or recurs.    Dispense:  10 tablet    Refill:  11    Order Specific Question:   Supervising Provider    Answer:   Melvenia Beam [[id number]]   Atogepant (QULIPTA) 60 MG TABS    Sig: Take 60 mg by mouth daily.    Dispense:  90 tablet    Refill:  3    Order Specific Question:   Supervising Provider    Answer:   Bess Harvest, FNP-C 09/27/2022, 1:56 PM [name] [address], [place], [place] [address] [phone]

## 2022-09-26 NOTE — Patient Instructions (Signed)
Below is our plan:  We will give you Toradol in the office. Do not take any more Nsaid medications for 24 hours. We will switch Emgality to [place]. Start Qulipta '60mg'$  daily. I will also swithc you back to eletriptan. Please take 1 tablet at onset of headache. May take 1 additional tablet in 2 hours if needed. Do not take more than 2 tablets in 24 hours or more than 10 in a month.   We will place a referral to Dr Brett Fairy for revaluation of concerns of sleep apnea. Continue close follow up with your care team.   Please make sure you are staying well hydrated. I recommend 50-60 ounces daily. Well balanced diet and regular exercise encouraged. Consistent sleep schedule with 6-8 hours recommended.   Please continue follow up with care team as directed.   Follow up with me in 6-12 months pending sleep evaluation.   You may receive a survey regarding today's visit. I encourage you to leave honest feed back as I do use this information to improve patient care. Thank you for seeing me today!

## 2022-09-27 ENCOUNTER — Ambulatory Visit (INDEPENDENT_AMBULATORY_CARE_PROVIDER_SITE_OTHER): Payer: Medicare Other | Admitting: Family Medicine

## 2022-09-27 ENCOUNTER — Other Ambulatory Visit: Payer: Self-pay

## 2022-09-27 ENCOUNTER — Other Ambulatory Visit: Payer: Self-pay | Admitting: Physician Assistant

## 2022-09-27 ENCOUNTER — Encounter: Payer: Self-pay | Admitting: Family Medicine

## 2022-09-27 VITALS — BP 133/99 | HR 98 | Ht 66.0 in | Wt 203.5 lb

## 2022-09-27 DIAGNOSIS — G43009 Migraine without aura, not intractable, without status migrainosus: Secondary | ICD-10-CM | POA: Diagnosis not present

## 2022-09-27 DIAGNOSIS — R0683 Snoring: Secondary | ICD-10-CM

## 2022-09-27 DIAGNOSIS — M542 Cervicalgia: Secondary | ICD-10-CM | POA: Diagnosis not present

## 2022-09-27 DIAGNOSIS — G8929 Other chronic pain: Secondary | ICD-10-CM

## 2022-09-27 DIAGNOSIS — R519 Headache, unspecified: Secondary | ICD-10-CM

## 2022-09-27 MED ORDER — ELETRIPTAN HYDROBROMIDE 40 MG PO TABS: 40.0000 mg | ORAL_TABLET | ORAL | 11 refills | Status: DC | PRN

## 2022-09-27 MED ORDER — QULIPTA 60 MG PO TABS: 60.0000 mg | ORAL_TABLET | Freq: Every day | ORAL | 3 refills | Status: DC

## 2022-09-27 MED ORDER — CITALOPRAM HYDROBROMIDE 20 MG PO TABS: 20.0000 mg | ORAL_TABLET | Freq: Every day | ORAL | 3 refills | Status: DC

## 2022-09-27 MED ORDER — KETOROLAC TROMETHAMINE 60 MG/2ML IM SOLN
60.0000 mg | Freq: Once | INTRAMUSCULAR | Status: AC
  Administered 2022-09-27: 60 mg via INTRAMUSCULAR

## 2022-09-27 NOTE — Progress Notes (Signed)
Gave Toradol '60mg'$ /74m IM in the Right Ventrogluteal. Cleaned with alcohol wipe prior to injection. Band-aid applied. Pt tolerated well.

## 2022-10-02 ENCOUNTER — Other Ambulatory Visit: Payer: Self-pay

## 2022-10-02 MED ORDER — RIZATRIPTAN BENZOATE 10 MG PO TABS: 10.0000 mg | ORAL_TABLET | ORAL | 2 refills | Status: DC | PRN

## 2022-10-03 ENCOUNTER — Telehealth: Payer: Self-pay | Admitting: Family Medicine

## 2022-10-03 NOTE — Telephone Encounter (Signed)
Pt said could not get new medication, Atogepant (QULIPTA) 60 MG TABS and eletriptan (RELPAX) 40 MG tablet  prescribed last office visit due to insurance do not have PA. Would like a call from the nurse to discuss the status of the PA.

## 2022-10-04 ENCOUNTER — Other Ambulatory Visit (HOSPITAL_COMMUNITY): Payer: Self-pay

## 2022-10-04 NOTE — Telephone Encounter (Signed)
Noted  

## 2022-10-04 NOTE — Telephone Encounter (Signed)
Pharmacy Patient Advocate Encounter   Received notification from [facility] that prior authorization for Qulipta '60MG'$  tablets is required/requested.    PA submitted on 10/04/2022 to (ins) [employer] [place] via CoverMyMeds Key [id number] Status is pending  Pharmacy Patient Advocate Encounter   Received notification from [facility] that prior authorization for Eletriptan Hydrobromide '40MG'$  tablets is required/requested.    PA submitted on 10/04/2022 to (ins) [employer]  via CoverMyMeds Key [place] Status is pending

## 2022-10-04 NOTE — Telephone Encounter (Signed)
Pharmacy Patient Advocate Encounter  Prior Authorization for Qulipta '60MG'$  tablets has been approved.    PA# Rx #: [id number] Effective dates: 10/04/22 through 10/05/23

## 2022-10-04 NOTE — Telephone Encounter (Signed)
Jasmine from [facility] called stating that both Qulipta and Eletriptan have been approved for a year. 10/04/22-10/05/23

## 2022-10-04 NOTE — Telephone Encounter (Signed)
Please call pt to let her know medications have been approved and she should be able to fill at her pharmacy now. Thank you

## 2022-10-06 ENCOUNTER — Other Ambulatory Visit (HOSPITAL_COMMUNITY): Payer: Self-pay

## 2022-10-06 NOTE — Telephone Encounter (Signed)
Pharmacy Patient Advocate Encounter  Prior Authorization for Eletriptan Hydrobromide '40MG'$  tablets has been approved.    PA# Rx #: [id number] Effective dates: 10/04/2022 through 10/05/2023

## 2022-10-16 ENCOUNTER — Other Ambulatory Visit (HOSPITAL_COMMUNITY): Payer: Self-pay

## 2022-10-17 ENCOUNTER — Ambulatory Visit (HOSPITAL_COMMUNITY): Payer: Medicare Other | Admitting: Psychiatry

## 2022-10-23 ENCOUNTER — Other Ambulatory Visit: Payer: Self-pay | Admitting: *Deleted

## 2022-10-23 NOTE — Telephone Encounter (Signed)
Per note on 09/27/22 ". I will have her discontinue Emgality and start Qulipta 42m daily. "  Rx denied. QLenoria Chimehas been approved by insurance per note on 10/03/22

## 2022-10-26 ENCOUNTER — Institutional Professional Consult (permissible substitution): Payer: Medicare Other | Admitting: Neurology

## 2022-11-02 ENCOUNTER — Encounter: Payer: Self-pay | Admitting: Family Medicine

## 2022-11-02 ENCOUNTER — Ambulatory Visit (INDEPENDENT_AMBULATORY_CARE_PROVIDER_SITE_OTHER): Payer: Medicare Other | Admitting: Family Medicine

## 2022-11-02 VITALS — BP 106/80 | HR 107 | Ht 66.0 in | Wt 203.0 lb

## 2022-11-02 DIAGNOSIS — E669 Obesity, unspecified: Secondary | ICD-10-CM | POA: Diagnosis not present

## 2022-11-02 DIAGNOSIS — E039 Hypothyroidism, unspecified: Secondary | ICD-10-CM | POA: Diagnosis not present

## 2022-11-02 DIAGNOSIS — J310 Chronic rhinitis: Secondary | ICD-10-CM

## 2022-11-02 MED ORDER — FLUTICASONE PROPIONATE 50 MCG/ACT NA SUSP: 2.0000 | Freq: Every day | NASAL | 6 refills | Status: DC

## 2022-11-02 NOTE — Progress Notes (Signed)
Molly Giles is alone Sources of clinical information for visit is/are patient. Nursing assessment for this office visit was reviewed with the patient for accuracy and revision.     Previous Report(s) Reviewed: none     04/10/2022    9:58 AM  Depression screen PHQ 2/9  Decreased Interest 1  Down, Depressed, Hopeless 0  PHQ - 2 Score 1  Altered sleeping 3  Tired, decreased energy 2  Change in appetite 2  Feeling bad or failure about yourself  1  Trouble concentrating 1  Moving slowly or fidgety/restless 0  Suicidal thoughts 0  PHQ-9 Score 10   Flowsheet Row Office Visit from 04/10/2022 in [facility] Office Visit from 02/02/2022 in [facility] Office Visit from 10/11/2021 in [facility]  Thoughts that you would be better off dead, or of hurting yourself in some way Not at all Not at all Not at all  PHQ-9 Total Score '10 9 10          '$ 04/10/2022    9:59 AM 02/02/2022    9:48 AM 10/11/2021   10:19 AM 09/21/2021    3:20 PM 11/26/2019    2:01 PM  [place] in the past year? 0 0 0 0 0  Number falls in past yr: 0 0 0 0   Injury with Fall? 0 0 0 0   Risk for fall due to :    Orthopedic patient;Other (Comment)   Risk for fall due to: Comment    Dizziness   Follow up Falls evaluation completed   Falls prevention discussed Falls evaluation completed       04/10/2022    9:58 AM 02/02/2022    9:47 AM 10/11/2021   10:18 AM  PHQ9 SCORE ONLY  PHQ-9 Total Score '10 9 10    '$ There are no preventive care reminders to display for this patient.  Health Maintenance Due  Topic Date Due   Zoster Vaccines- Shingrix (2 of 2) 10/07/2018   COLONOSCOPY (Pts 45-72yr Insurance coverage will need to be confirmed)  04/20/2020   Medicare Annual Wellness (AWV)  09/21/2022      History/P.E. limitations: none  There are no preventive care reminders to display for this patient. There are no preventive care reminders to  display for this patient.  Health Maintenance Due  Topic Date Due   Zoster Vaccines- Shingrix (2 of 2) 10/07/2018   COLONOSCOPY (Pts 45-434yrInsurance coverage will need to be confirmed)  04/20/2020   Medicare Annual Wellness (AWV)  09/21/2022     Chief Complaint  Patient presents with   weight managment      --------------------------------------------------------------------------------------------------------------------------------------------- Visit Problem List with A/P  No problem-specific Assessment & Plan notes found for this encounter.

## 2022-11-02 NOTE — Patient Instructions (Addendum)
I will make the referral to [facility] once I figure out how to do it.  We are checking your Thyroid labs today.   Please try a trial of Flonase two sprays each nostril once a day.

## 2022-11-03 ENCOUNTER — Encounter: Payer: Self-pay | Admitting: Family Medicine

## 2022-11-03 DIAGNOSIS — J310 Chronic rhinitis: Secondary | ICD-10-CM | POA: Insufficient documentation

## 2022-11-03 LAB — TSH: TSH: 1.74 u[IU]/mL (ref 0.450–4.500)

## 2022-11-03 LAB — T4, FREE: Free T4: 1.31 ng/dL (ref 0.82–1.77)

## 2022-11-03 NOTE — Assessment & Plan Note (Signed)
Established problem Mngt no longer with other physician We have assumed mngt. Taking Levothyroxine 125 microgram daily  Checking TSH & FT4 (patient request)

## 2022-11-03 NOTE — Assessment & Plan Note (Signed)
New problem Onset during Covid infection in December and has not stopped.   Has nasal congestion, clear mucus with blowing nose, some post nasal drip.  Using NS spray with mild benefit  HEENT: TM's normal, No E/E oropharynx  A/ Persistent Rhinitis P/ Trial Flonase

## 2022-11-03 NOTE — Assessment & Plan Note (Signed)
Established problem\ Molly Giles is interested in changing lifestyle for weight loss She has had experience with [facility], and is willing to try it again.  Referral made.

## 2022-11-07 ENCOUNTER — Ambulatory Visit (INDEPENDENT_AMBULATORY_CARE_PROVIDER_SITE_OTHER): Payer: Medicare Other | Admitting: Neurology

## 2022-11-07 ENCOUNTER — Encounter: Payer: Self-pay | Admitting: Neurology

## 2022-11-07 VITALS — BP 114/84 | HR 108 | Ht 66.0 in | Wt 203.0 lb

## 2022-11-07 DIAGNOSIS — F513 Sleepwalking [somnambulism]: Secondary | ICD-10-CM | POA: Insufficient documentation

## 2022-11-07 DIAGNOSIS — G44011 Episodic cluster headache, intractable: Secondary | ICD-10-CM

## 2022-11-07 DIAGNOSIS — F5104 Psychophysiologic insomnia: Secondary | ICD-10-CM

## 2022-11-07 DIAGNOSIS — F1310 Sedative, hypnotic or anxiolytic abuse, uncomplicated: Secondary | ICD-10-CM | POA: Diagnosis not present

## 2022-11-07 DIAGNOSIS — R519 Headache, unspecified: Secondary | ICD-10-CM

## 2022-11-07 HISTORY — DX: Psychophysiologic insomnia: F51.04
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
## 2022-11-07 NOTE — Patient Instructions (Signed)
Insomnia Insomnia Insomnia is a sleep disorder that makes it difficult to fall asleep or stay asleep. Insomnia can cause fatigue, low energy, difficulty concentrating, mood swings, and poor performance at work or school. There are three different ways to classify insomnia: Difficulty falling asleep. Difficulty staying asleep. Waking up too early in the morning. Any type of insomnia can be long-term (chronic) or short-term (acute). Both are common. Short-term insomnia usually lasts for 3 months or less. Chronic insomnia occurs at least three times a week for longer than 3 months. What are the causes? Insomnia may be caused by another condition, situation, or substance, such as: Having certain mental health conditions, such as anxiety and depression. Using caffeine, alcohol, tobacco, or drugs. Having gastrointestinal conditions, such as gastroesophageal reflux disease (GERD). Having certain medical conditions. These include: Asthma. Alzheimer's disease. Stroke. Chronic pain. An overactive thyroid gland (hyperthyroidism). Other sleep disorders, such as restless legs syndrome and sleep apnea. Menopause. Sometimes, the cause of insomnia may not be known. What increases the risk? Risk factors for insomnia include: Gender. Females are affected more often than males. Age. Insomnia is more common as people get older. Stress and certain medical and mental health conditions. Lack of exercise. Having an irregular work schedule. This may include working night shifts and traveling between different time zones. What are the signs or symptoms? If you have insomnia, the main symptom is having trouble falling asleep or having trouble staying asleep. This may lead to other symptoms, such as: Feeling tired or having low energy. Feeling nervous about going to sleep. Not feeling rested in the morning. Having trouble concentrating. Feeling irritable, anxious, or depressed. How is this diagnosed? This  condition may be diagnosed based on: Your symptoms and medical history. Your health care provider may ask about: Your sleep habits. Any medical conditions you have. Your mental health. A physical exam. How is this treated? Treatment for insomnia depends on the cause. Treatment may focus on treating an underlying condition that is causing the insomnia. Treatment may also include: Medicines to help you sleep. Counseling or therapy. Lifestyle adjustments to help you sleep better. Follow these instructions at home: Eating and drinking  Limit or avoid alcohol, caffeinated beverages, and products that contain nicotine and tobacco, especially close to bedtime. These can disrupt your sleep. Do not eat a large meal or eat spicy foods right before bedtime. This can lead to digestive discomfort that can make it hard for you to sleep. Sleep habits  Keep a sleep diary to help you and your health care provider figure out what could be causing your insomnia. Write down: When you sleep. When you wake up during the night. How well you sleep and how rested you feel the next day. Any side effects of medicines you are taking. What you eat and drink. Make your bedroom a dark, comfortable place where it is easy to fall asleep. Put up shades or blackout curtains to block light from outside. Use a white noise machine to block noise. Keep the temperature cool. Limit screen use before bedtime. This includes: Not watching TV. Not using your smartphone, tablet, or computer. Stick to a routine that includes going to bed and waking up at the same times every day and night. This can help you fall asleep faster. Consider making a quiet activity, such as reading, part of your nighttime routine. Try to avoid taking naps during the day so that you sleep better at night. Get out of bed if you are still awake  after 15 minutes of trying to sleep. Keep the lights down, but try reading or doing a quiet activity. When you  feel sleepy, go back to bed. General instructions Take over-the-counter and prescription medicines only as told by your health care provider. Exercise regularly as told by your health care provider. However, avoid exercising in the hours right before bedtime. Use relaxation techniques to manage stress. Ask your health care provider to suggest some techniques that may work well for you. These may include: Breathing exercises. Routines to release muscle tension. Visualizing peaceful scenes. Make sure that you drive carefully. Do not drive if you feel very sleepy. Keep all follow-up visits. This is important. Contact a health care provider if: You are tired throughout the day. You have trouble in your daily routine due to sleepiness. You continue to have sleep problems, or your sleep problems get worse. Get help right away if: You have thoughts about hurting yourself or someone else. Get help right away if you feel like you may hurt yourself or others, or have thoughts about taking your own life. Go to your nearest emergency room or: Call 911. Call the [place] at [phone] or 988. This is open 24 hours a day. Text the Crisis Text Line at (773)307-6689. Summary Insomnia is a sleep disorder that makes it difficult to fall asleep or stay asleep. Insomnia can be long-term (chronic) or short-term (acute). Treatment for insomnia depends on the cause. Treatment may focus on treating an underlying condition that is causing the insomnia. Keep a sleep diary to help you and your health care provider figure out what could be causing your insomnia. This information is not intended to replace advice given to you by your health care provider. Make sure you discuss any questions you have with your health care provider. Document Revised: 08/08/2021 Document Reviewed: 08/08/2021 Elsevier Patient Education  [place].

## 2022-11-10 DIAGNOSIS — M722 Plantar fascial fibromatosis: Secondary | ICD-10-CM | POA: Insufficient documentation

## 2022-11-10 HISTORY — DX: Plantar fascial fibromatosis: M72.2

## 2022-11-14 ENCOUNTER — Ambulatory Visit (HOSPITAL_COMMUNITY): Payer: Medicare Other | Admitting: Psychiatry

## 2022-11-23 ENCOUNTER — Encounter: Payer: Self-pay | Admitting: Family Medicine

## 2022-11-23 ENCOUNTER — Other Ambulatory Visit: Payer: Self-pay | Admitting: Family Medicine

## 2022-11-23 ENCOUNTER — Telehealth: Payer: Self-pay | Admitting: Neurology

## 2022-11-23 MED ORDER — EMGALITY 120 MG/ML ~~LOC~~ SOAJ: 120.0000 mg | SUBCUTANEOUS | 3 refills | Status: DC

## 2022-11-23 NOTE — Telephone Encounter (Signed)
Molly Giles- are you ok with her going back to Emgality?

## 2022-11-23 NOTE — Telephone Encounter (Signed)
Pt has asked that Amy, NP be made aware that while she was on the [place] she had 10 migraines within that month.  Pt states she is going back to [place] [phone]

## 2022-11-24 ENCOUNTER — Encounter: Payer: Self-pay | Admitting: Family Medicine

## 2022-11-27 ENCOUNTER — Telehealth: Payer: Self-pay | Admitting: Neurology

## 2022-11-27 DIAGNOSIS — Z0289 Encounter for other administrative examinations: Secondary | ICD-10-CM

## 2022-11-27 NOTE — Telephone Encounter (Signed)
Pt reports that she has been informed that a PA is needed for her  Galcanezumab-gnlm (EMGALITY) 120 MG/ML SOAJ ,

## 2022-11-27 NOTE — Telephone Encounter (Signed)
Noted  

## 2022-11-27 NOTE — Telephone Encounter (Signed)
PA submitted on covermymeds. Key: [id number]. Waiting on determination from [place].

## 2022-11-28 ENCOUNTER — Telehealth: Payer: Self-pay | Admitting: Family Medicine

## 2022-11-28 NOTE — Telephone Encounter (Signed)
Raquel Sarna, looks like Dr. Brett Fairy ordered HST 11/07/22. Can you provide pt with update?

## 2022-11-28 NOTE — Telephone Encounter (Signed)
Per covermymeds, PA denied but gives no details. Waiting on denial letter to have a further explanation

## 2022-11-28 NOTE — Telephone Encounter (Signed)
Pt called stated she is following up on home sleep study. She is requesting a call back.

## 2022-11-28 NOTE — Telephone Encounter (Signed)
Pt called stated she was denied by insurance for [employer]. Pt is asking if Amy can do a peer to peer. Stated she has already put an appeal in. Pt stated she is all out of migraine medication. Pt is asking if there are any samples available she can get until insurance approves.

## 2022-11-29 ENCOUNTER — Telehealth: Payer: Self-pay | Admitting: Family Medicine

## 2022-11-29 NOTE — Telephone Encounter (Signed)
Amy out today, will be back in the am. Once she signs, I will fax back.

## 2022-11-29 NOTE — Telephone Encounter (Signed)
We are aware. We are going to be sending urgent appeal letter on our end to fax# pt provided (also on denial letter received this am from [facility])

## 2022-11-29 NOTE — Telephone Encounter (Signed)
Sent mychart message

## 2022-11-29 NOTE — Telephone Encounter (Signed)
Nikki called back. Stated she needs th know the benefits from pt taking Emgality. She is requesting it be sent ASAP.

## 2022-11-29 NOTE — Telephone Encounter (Signed)
Contacted [facility] to schedule their annual wellness visit. Appointment made for 12/07/2022.  Thank you,  [place] Direct dial  [phone]

## 2022-11-29 NOTE — Telephone Encounter (Signed)
Nikki from [place] called needing notes showing that the pt is getting Clinical Benefits from being on the [place]. Lexine Baton is needing a call back at [phone] and VM is confidential. She states that the pt has already started the appeal. Please advise.

## 2022-11-30 NOTE — Telephone Encounter (Signed)
Faxed urgent appeal letter to [facility] at [phone]. Received fax confirmation. Waiting on determination.

## 2022-12-06 NOTE — Patient Instructions (Signed)

## 2022-12-06 NOTE — Progress Notes (Unsigned)
I connected with  Molly Giles on 12/07/2022 by a audio enabled telemedicine application and verified that I am speaking with the correct person using two identifiers.  Patient Location: Home  Provider Location: Home Office  I discussed the limitations of evaluation and management by telemedicine. The patient expressed understanding and agreed to proceed.   Subjective:   Molly Giles is a 67 y.o. female who presents for Medicare Annual (Subsequent) preventive examination.  Review of Systems    Per HPI unless specifically indicated below. Cardiac Risk Factors include: advanced age (>22men, >49 women);female gender, and Pure hypercholesterolemia.          Objective:       11/07/2022   12:51 PM 11/02/2022    3:40 PM 09/27/2022    1:19 PM  Vitals with BMI  Height 5\' 6"  5\' 6"  5\' 6"   Weight 203 lbs 203 lbs 203 lbs 8 oz  BMI 32.78 [id number]  Systolic [id number]  Diastolic 84 80 99  Pulse [id number] 107 98    Today's Vitals   12/07/22 1416  PainSc: 5    There is no height or weight on file to calculate BMI.     12/07/2022    2:39 PM 11/02/2022    3:38 PM 04/10/2022    9:59 AM 02/02/2022    9:47 AM 10/25/2021    4:19 PM 09/21/2021    3:19 PM 07/22/2020   10:39 AM  Advanced Directives  Does Patient Have a Medical Advance Directive? Yes No Yes No Yes Yes Yes  Type of Advance Directive Living will  [place];Living will  Living will;Out of facility DNR (pink MOST or yellow form) [place];Living will Living will;Healthcare Power of Attorney  Does patient want to make changes to medical advance directive? No - Patient declined     No - Patient declined   Copy of [place] in Chart?   No - copy requested   No - copy requested No - copy requested  Would patient like information on creating a medical advance directive?  No - Patient declined  No - Patient declined No - Patient declined      Current Medications  (verified) Outpatient Encounter Medications as of 12/07/2022  Medication Sig   cetirizine (ZYRTEC) 10 MG tablet Take 10 mg by mouth daily as needed.   cholecalciferol (VITAMIN D3) 25 MCG (1000 UNIT) tablet Take 1,000 Units by mouth daily.   citalopram (CELEXA) 20 MG tablet Take 1 tablet (20 mg total) by mouth daily.   eletriptan (RELPAX) 40 MG tablet Take 1 tablet (40 mg total) by mouth as needed for migraine or headache. May repeat in 2 hours if headache persists or recurs.   estradiol (ESTRACE) 0.1 MG/GM vaginal cream Apply one half gram (pea-sized amount) on fingertip and place intravaginally three times weekly.   Galcanezumab-gnlm (EMGALITY) 120 MG/ML SOAJ Inject 120 mg into the skin every 30 (thirty) days.   levothyroxine (SYNTHROID) 125 MCG tablet Take 0.5 tablets (62.5 mcg total) by mouth daily before breakfast.   meloxicam (MOBIC) 15 MG tablet Take by mouth as needed.   methenamine (HIPREX) 1 g tablet Take 1 g by mouth 2 (two) times daily with a meal.   methocarbamol (ROBAXIN) 500 MG tablet Take 500 mg by mouth as needed.    minoxidil (LONITEN) 2.5 MG tablet Take by mouth daily.   nystatin-triamcinolone (MYCOLOG II) cream APPLY TOPICALLY TO THE AFFECTED AREA TWICE DAILY  Phenazopyridine HCl (AZO URINARY PAIN PO) Take by mouth.   rosuvastatin (CRESTOR) 10 MG tablet TAKE 1 TABLET(10 MG) BY MOUTH DAILY   SYSTANE BALANCE 0.6 % SOLN SMARTSIG:1 Drop(s) In Eye(s) PRN   traMADol (ULTRAM) 50 MG tablet Take 50 mg by mouth as needed.   zolpidem (AMBIEN) 5 MG tablet Take 1 tablet (5 mg total) by mouth at bedtime as needed for sleep.   dicyclomine (BENTYL) 10 MG capsule Take 1 capsule (10 mg total) by mouth 3 (three) times daily as needed.   fluticasone (FLONASE) 50 MCG/ACT nasal spray Place 2 sprays into both nostrils daily. (Patient not taking: Reported on 12/07/2022)   No facility-administered encounter medications on file as of 12/07/2022.    Allergies (verified) Sulfa antibiotics,  Chlorhexidine gluconate, Codeine, Levofloxacin, Lyrica [pregabalin], Methylprednisolone, Prednisone, Betadine [povidone iodine], Latex, and Wellbutrin [bupropion]   History: Past Medical History:  Diagnosis Date   Abdominal bloating 05/10/2021   Abdominal discomfort, generalized 05/07/2016   Acute gastritis 11/19/2020   Allergic rhinoconjunctivitis 07/02/2015   Anal fissure 11/19/2020   Anterior to posterior tear of superior glenoid labrum of left shoulder 02/22/2021   Arthritis    Benign positional vertigo 04/2015   Responded well to [facility]   Bruxism (teeth grinding)    Chronic contact dermatitis 08/06/2018   Per allergist Dr Pablo Lawrence.    Chronic migraine 02/25/2016   Per Dr Jaynee Eagles review in notes [facility] from September 2015. Showed total headache days last month 18. Severe headache days 7 days. Moderate headache days 5 days. Mild headache days last month sick days. Days without headache last month 10 days. Symptoms associated with photophobia, phonophobia, osmophobia, neck pain, dizziness, jaw pain, nasal congestion, vision disturbances, tingling and numbness, weakness and worsening with activity. Each headache attack last 3 hours depending on treatment in severity. Left side, the right side, easier side, the frontal area in the back of the head. Characterized as throbbing, pressure, tightness, squeezing, stabbing and burning    Chronic migraine without aura 05/15/2013    Dr Jaynee Eagles [facility]   Chronic tonsillitis 01/27/2019   DDD (degenerative disc disease), cervical 11/18/2018   DDD (degenerative disc disease), lumbar    Depression    Disc displacement, lumbar    Dysphagia 10/11/2021   Dysrhythmia    seen by dr Wynonia Lawman- not a problem since she has been on Bystolic   Episodic cluster headache, not intractable 03/06/2017   Essential hypertension 05/15/2013   Family history of adverse reaction to anesthesia    Brother- N/V    Family history of premature CAD 05/15/2013   Fibromyalgia syndrome 07/01/2015   Management by Dr Chauncey Cruel. Devashwar (Rheum)    GERD (gastroesophageal reflux disease) 12/16/2014   H/O seasonal allergies    Hallux rigidus of right foot 09/25/2022   Hammer toe    Left great toe   Hashimoto's thyroiditis    Per patient, diagnosed by Dr. Modena Nunnery   Hearing loss of both ears 07/27/2015   mild to borderline moderate low frequency hearing loss improving to within normal limits bilaterally on audiology testing at [facility] in November 2016.     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   Hyperlipidemia 1998   Hypokalemia due to excessive gastrointestinal loss of potassium 07/28/2019   Hypothyroidism    Impairment of balance 02/2015   Consequent of postconcussive syndrome   Injury of triangular fibrocartilage complex of left wrist 02/25/2019   Dx 02/25/19 Iran Planas IV MD ([facility])   Insulin resistance 07/04/2017   Internal hemorrhoid 01/28/2021   Internal hemorrhoid seen on colonoscopy 10/2020 Warnell Bureau MD [facility])   Interstitial cystitis    Irritable bowel syndrome with diarrhea 04/03/2016   Left ventricular hypertrophy, mild 02/25/2016   ECHOcardiogram report 06/17/15 showing EF55-60%, mild LVH and G1DD    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[place]) 03/10/2018   [facility]   Lumbar facet joint pain    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   Mood disorder ([place])    Morbid obesity ([place]) 03/06/2017   Musculoskeletal neck pain 07/14/2015   Nocturnal hypoxemia 03/06/2017   Normal coronary arteries 05/14/2014   Obesity (BMI 30.0-34.9) 01/29/2017   Odynophagia 12/20/2021    Osteoarthritis of left hip 11/01/2015   MRI order by Dr Alvan Dame (ortho) 10/2015 showed significant arthritis of left hip joint with cystic changes in femoral head c/w osteoarthritis   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, lumbar region 10/30/2011   Other insomnia 11/09/2016   Overweight    Pain in joint of left shoulder 11/09/2016   Pain in joint, multiple sites 11/18/2018   Palpitations 05/15/2013   [facility] manages   Perianal candidiasis 12/20/2021   Periodic limb movement sleep disorder 03/28/2017   Perirectal cyst 05/07/2016   Poison ivy dermatitis 03/24/2021   PONV (postoperative nausea and vomiting)    Positive ANA (antinuclear antibody) 11/18/2018   Post concussion syndrome 06/06/2015   Post concussive syndrome 07/14/2015   Molly Ante's post-concussive syndrome manifesting in vertigo and headache, mood changes, poor balance, dizziness, and decreased concentration per Dr Jaynee Eagles at [facility].    Posterior vitreous detachment of right eye 2014   Premature menopause 12/20/2021   Rectal abscess 05/10/2021   S/P left THA, AA 07/25/2016   Sacroiliac inflammation ([place]) 08/18/2021   Shingles    Shortness of breath dyspnea    with exertion   Sicca syndrome ([place]) 11/18/2018   (+) ANA   Sleep walking and eating 03/06/2017   Snoring 03/06/2017   Spondylosis of lumbar region without myelopathy or radiculopathy 10/30/2011   TFC (triangular fibrocartilage complex) injury 02/25/2019   Thyroid nodule 08/11/2009   Findings: The thyroid gland is within normal limits in size.  The gland is diffusely inhomogeneous. A small solid nodule is noted in the lower pole  medially on the right of 7 x 6 x 8 mm. A small solid nodule is noted inferiorly on the left of 3 x 3 x 4 mm.  IMPRESSION:  The thyroid gland is within normal limits in size with only small solid nodules present, the largest of only 8 mm in diameter on the right.     Trochanteric bursitis of left hip     Osteoarthritis from left hip dysplasia; mild dysplasia [place] 1.    Trochanteric bursitis, right hip 04/26/2020   Tubular adenoma of colon 01/28/2021   Colonoscopy screening 4 mm tubular Adenoma polyp Wilford Corner, MD [facility])   Tubular adenoma of colon 01/28/2021   Colonoscopy screening 4 mm tubular Adenoma polyp Wilford Corner, MD [facility])   Vasomotor symptoms due to menopause 04/19/2017   Vitamin D deficiency 05/08/2017   Vulvitis 07/22/2020   Yeast  vaginitis 07/11/2019   Past Surgical History:  Procedure Laterality Date   arthroscopy Left 01/2022   with SAD and DCR, K. Supple MD   Bladder dilitation     x 3   BREAST BIOPSY Right 2011   Benign histology   CARDIOVASCULAR STRESS TEST  2000   Unremarkable per pt report   CARPOMETACARPAL JOINT ARTHROTOMY Right 2011   COLONOSCOPY     COLONOSCOPY WITH PROPOFOL N/A 04/21/2015   Procedure: COLONOSCOPY WITH PROPOFOL;  Surgeon: Carol Ada, MD;  Location: [facility] ENDOSCOPY;  Service: Endoscopy;  Laterality: N/A;   CYSTOSCOPY W/ DILATION OF BLADDER N/A    EPIDURAL BLOCK INJECTION Left 04/12/2016   Left Medial Nerve Block and Left L5 ramus block, Dr Suella Broad    EPIDURAL BLOCK INJECTION  03/21/2016   Left L3-4 medial branch block and Left L5 & dorsal ramus block    EPIDURAL BLOCK INJECTION N/A 10/25/2016   Suella Broad, MD. Lumbar medial branch block   EPIDURAL BLOCK INJECTION N/A 02/09/2017   Suella Broad, MD.  Bilateral L3/4 medial branch block, bilateral L5 dorsal ramus block   EPIDURAL BLOCK INJECTION N/A 07/04/2017   Suella Broad, MD   FECAL TRANSPLANT  04/21/2015   Procedure: FECAL TRANSPLANT;  Surgeon: Carol Ada, MD;  Location: [facility]COPY;  Service: Endoscopy;;   HIP ARTHROPLASTY Left    HIP ARTHROSCOPY Left 03/06/2018   Left hip arthroplasty, redo for loose hip arthroplasty. Procedure at [place] Left 11/2015   for OA by Dr Charolett Bumpers IMPLANT  PLACEMENT  04/2021   Pia Mau MD ([facility])   INTERSTIM IMPLANT REVISION N/A 09/2021   Pia Mau MD ([facility])   OTHER SURGICAL HISTORY Left 2016   Left L3/L4 medial nerve block and Left L5 Dorsal Ramus block Dr Mickel Duhamel   TOTAL HIP ARTHROPLASTY Left 07/25/2016   Procedure: LEFT TOTAL HIP ARTHROPLASTY ANTERIOR APPROACH;  Surgeon: Paralee Cancel, MD;  Location: [facility] ORS;  Service: Orthopedics;  Laterality: Left;   Family History  Problem Relation Age of Onset   Alzheimer's disease Mother    Hyperlipidemia Mother    Hypertension Mother    Osteoporosis Mother    Parkinson's disease Mother    Migraines Sister    Allergies Sister    Hypertension Sister    Hyperlipidemia Brother    Cardiomyopathy Brother    Diabetes type II Brother    Kidney disease Brother    Asthma Brother    Heart disease Brother    Hypertension Brother    Hyperlipidemia Brother    Kidney disease Brother    Heart disease Brother    Heart disease Father    Hyperlipidemia Father    Hypertension Father    Aortic aneurysm Father    Early death Father 31   Diabetes type II Other    Breast cancer Maternal Aunt    Social History   Socioeconomic History   Marital status: Married    Spouse name: Cayetana Hettinger   Number of children: 1   Years of education: 16   Highest education level: Bachelor's degree (e.g., BA, AB, BS)  Occupational History   Occupation: Psychologist, counselling: [employer]    Comment: Retired   Occupation: Receptionist    Employer: [employer]    Comment: Part-time  Tobacco Use   Smoking status: Never    Passive exposure: Past   Smokeless  tobacco: Never  Vaping Use   Vaping Use: Never used  Substance and Sexual Activity   Alcohol use: No    Alcohol/week: 0.0 standard drinks of alcohol    Comment: No use in 30 years.   Drug use: No   Sexual activity: Yes    Partners: Male    Birth control/protection: None  Other Topics Concern   Not on  file  Social History Narrative   Prior PCP With [facility] at [place], [place].   Married, lives with Edison, (b. 1954)   Son in [place]. (2) grandchildren.   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    Wears seatbelt usually   No religious beliefs affecting healthcare   No difficulty taking medications as directed.       Home has working smoke alarm   No home throw rugs   Does not have nonslip bathtub / shower    Has railings on all stairs   Home is free from [place].    Right-handed.      Best number to reach patient [phone] (M) as of 09/21/2021.    No regular exercise of 3 times a week for 30 minutes at a time      [place] has Scientist, physiological and a Doctor, general practice is husband, Ayeesha Conradi [phone]      Caffeine: 2-3 cups coffee per day   Social Determinants of Health   Financial Resource Strain: Low Risk  (12/07/2022)   Overall Financial Resource Strain (CARDIA)    Difficulty of Paying Living Expenses: Not hard at all  Food Insecurity: No Food Insecurity (12/07/2022)   Hunger Vital Sign    Worried About Running Out of Food in the Last Year: Never true    Ran Out of Food in the Last Year: Never true  Transportation Needs: No Transportation Needs (12/07/2022)   PRAPARE - Hydrologist (Medical): No    Lack of Transportation (Non-Medical): No  Physical Activity: Inactive (12/07/2022)   Exercise Vital Sign    Days of Exercise per Week: 0 days    Minutes of Exercise per Session: 0 min  Stress: No Stress Concern Present (12/07/2022)   [place]    Feeling of Stress : Only a little  Social Connections: Moderately Integrated (12/07/2022)   Social Connection and Isolation Panel [NHANES]    Frequency of Communication with Friends and Family: More than three times a week    Frequency  of Social Gatherings with Friends and Family: Twice a week    Attends Religious Services: Never    Marine scientist or Organizations: No    Attends Music therapist: 1 to 4 times per year    Marital Status: Married    Tobacco Counseling Counseling given: No   Clinical Intake:  Pre-visit preparation completed: No  Pain : 0-10 Pain Score: 5  Pain Type: Chronic pain Pain Location: Hip Pain Orientation: Right Pain Descriptors / Indicators: Aching, Throbbing Pain Onset: More than a month ago Pain Frequency: Intermittent     Nutritional Status: BMI > 30  Obese Nutritional Risks: Unintentional weight gain Diabetes: No  How often do you need to have someone help you when you read instructions, pamphlets, or other written materials from your doctor or pharmacy?: 1 - Never  Diabetic?No  Interpreter Needed?: No  Information  entered by :: Donnie Mesa, CMA   Activities of Daily Living    12/07/2022    2:15 PM  In your present state of health, do you have any difficulty performing the following activities:  Hearing? 0  Vision? 1  Comment dry eyes, Dr Monica Martinez 11/26/21  Difficulty concentrating or making decisions? 1  Walking or climbing stairs? 1  Comment cane  Dressing or bathing? 0  Doing errands, shopping? 0    Patient Care Team: McDiarmid, Blane Ohara, MD as PCP - General (Family Medicine) Roseanne Kaufman, MD as Consulting Physician (Orthopedic Surgery) Troy Sine, MD as Consulting Physician (Cardiology) Suella Broad, MD as Consulting Physician (Physical Medicine and Rehabilitation) Carol Ada, MD as Consulting Physician (Gastroenterology) Christy Sartorius, MD as Consulting Physician (Urology) Melvenia Beam, MD as Consulting Physician (Neurology) Carlyle Basques, MD as Consulting Physician (Infectious Diseases) Bo Merino, MD as Consulting Physician (Rheumatology) Mervyn Gay, PT as Physical Therapist (Physical  Therapy) Louretta Shorten, MD as Consulting Physician (Obstetrics and Gynecology) Thornell Sartorius, MD as Consulting Physician (Otolaryngology) [facility] as Consulting Physician Paralee Cancel, MD as Consulting Physician (Orthopedic Surgery) Pablo Lawrence as Consulting Physician (Allergy and Immunology) Marvene Staff (Inactive) as Consulting Physician (Family Medicine) Vevelyn Royals, MD as Consulting Physician (Ophthalmology) Ardis Hughs, MD as Consulting Physician (Urology)  Indicate any recent [place] you may have received from other than [facility] providers in the past year (date may be approximate).     Assessment:   This is a routine wellness examination for Molly Giles.  Hearing/Vision screen Denies any hearing loss. Admit to some blurry vision that she associate with her dry eyes. Frequent eye exam to manage her condition.   Dietary issues and exercise activities discussed: Current Exercise Habits: The patient does not participate in regular exercise at present, Exercise limited by: orthopedic condition(s)   Goals Addressed   None    Depression Screen    12/07/2022    2:14 PM 04/10/2022    9:58 AM 02/02/2022    9:47 AM 10/11/2021   10:18 AM 09/21/2021    3:16 PM 07/22/2020   10:46 AM 11/26/2019    2:01 PM  PHQ 2/9 Scores  PHQ - 2 Score 0 1 2 2  0 0 1  PHQ- 9 Score  10 9 10  5      Fall Risk    12/07/2022    2:15 PM 04/10/2022    9:59 AM 02/02/2022    9:48 AM 10/11/2021   10:19 AM 09/21/2021    3:20 PM  Fall Risk   Falls in the past year? 0 0 0 0 0  Number falls in past yr: 0 0 0 0 0  Injury with Fall? 0 0 0 0 0  Risk for fall due to : No Fall Risks    Orthopedic patient;Other (Comment)  Risk for fall due to: Comment     Dizziness  Follow up Falls evaluation completed Falls evaluation completed   Falls prevention discussed    FALL RISK PREVENTION PERTAINING TO THE HOME:  Any stairs in or around the home? Yes  If so, are there  any without handrails? No  Home free of loose throw rugs in walkways, pet beds, electrical cords, etc? Yes  Adequate lighting in your home to reduce risk of falls? Yes   ASSISTIVE DEVICES UTILIZED TO PREVENT FALLS:  Life alert? No  Use of a cane, walker or w/c? Yes  Grab bars in the bathroom? Yes  Shower chair or bench in shower? No  Elevated toilet seat or a handicapped toilet? No   TIMED UP AND GO:  Was the test performed? Unable to perform, virtual appointment   Cognitive Function:    02/25/2016   12:53 PM  MMSE - Mini Mental State Exam  Orientation to time comments Testing: Molly Giles named 18 different animal types in 50 seconds WNL        09/21/2021    3:21 PM  6CIT Screen  What Year? 0 points  What month? 0 points  What time? 0 points  Count back from 20 0 points  Months in reverse 0 points  Repeat phrase 0 points  Total Score 0 points    Immunizations Immunization History  Administered Date(s) Administered   Fluad Quad(high Dose 65+) 07/22/2020   Influenza Inj Mdck Quad Pf 08/12/2018   Influenza,inj,Quad PF,6+ Mos 07/01/2015, 05/10/2017, 06/26/2019   Influenza-Unspecified 09/13/2016, 08/12/2018, 06/11/2021   Moderna Sars-Covid-2 Vaccination 11/19/2019, 12/18/2019, 08/06/2020   PNEUMOCOCCAL CONJUGATE-20 10/11/2021   Pfizer Covid-19 Vaccine Bivalent Booster 43yrs & up 08/23/2021   Tdap 11/09/2011, 02/24/2016, 09/13/2016   Zoster Recombinat (Shingrix) 08/12/2018, 12/15/2021    TDAP status: Up to date  Flu Vaccine status: Due, Education has been provided regarding the importance of this vaccine. Advised may receive this vaccine at local pharmacy or Health Dept. Aware to provide a copy of the vaccination record if obtained from local pharmacy or Health Dept. Verbalized acceptance and understanding.  Pneumococcal vaccine status: Up to date  Covid-19 vaccine status: Information provided on how to obtain vaccines.   Qualifies for Shingles Vaccine? Yes   Zostavax  completed Yes   Screening Tests Health Maintenance  Topic Date Due   COLONOSCOPY (Pts 45-15yrs Insurance coverage will need to be confirmed)  04/20/2020   COVID-19 Vaccine (5 - 2023-24 season) 05/12/2022   INFLUENZA VACCINE  12/10/2022 (Originally 04/11/2022)   Medicare Annual Wellness (AWV)  12/07/2023   MAMMOGRAM  05/12/2024   DEXA SCAN  05/12/2024   DTaP/Tdap/Td (4 - Td or Tdap) 09/13/2026   Pneumonia Vaccine 46+ Years old  Completed   Hepatitis C Screening  Completed   Zoster Vaccines- Shingrix  Completed   HPV VACCINES  Aged Out    Health Maintenance  Health Maintenance Due  Topic Date Due   COLONOSCOPY (Pts 45-22yrs Insurance coverage will need to be confirmed)  04/20/2020   COVID-19 Vaccine (5 - 2023-24 season) 05/12/2022   Colorectal Caner Screening: The patient state she had a colonoscopy last year at [facility]. I will request colonoscopy results.    Mammogram status: Completed 05/12/2022. Repeat every year  DEXA Scan: 05/12/2014  Lung Cancer Screening: (Low Dose CT Chest recommended if Age 15-80 years, 30 pack-year currently smoking OR have quit w/in 15years.) does not qualify.   Lung Cancer Screening Referral: not applicable Additional Screening:  Hepatitis C Screening: does qualify; Completed 07/01/2015  Vision Screening: Recommended annual ophthalmology exams for early detection of glaucoma and other disorders of the eye. Is the patient up to date with their annual eye exam?  Yes  Who is the provider or what is the name of the office in which the patient attends annual eye exams? Dr. Monica Martinez  If pt is not established with a provider, would they like to be referred to a provider to establish care? No .   Dental Screening: Recommended annual dental exams for proper oral hygiene  Community Resource Referral / Chronic Care Management: CRR required this visit?  No   CCM required this visit?  No      Plan:     I have personally reviewed and  noted the following in the patient's chart:   Medical and social history Use of alcohol, tobacco or illicit drugs  Current medications and supplements including opioid prescriptions. Patient is not currently taking opioid prescriptions. Functional ability and status Nutritional status Physical activity Advanced directives List of other physicians Hospitalizations, surgeries, and ER visits in previous 12 months Vitals Screenings to include cognitive, depression, and falls Referrals and appointments  In addition, I have reviewed and discussed with patient certain preventive protocols, quality metrics, and best practice recommendations. A written personalized care plan for preventive services as well as general preventive health recommendations were provided to patient.    Molly. Giles , Thank you for taking time to come for your Medicare Wellness Visit. I appreciate your ongoing commitment to your health goals. Please review the following plan we discussed and let me know if I can assist you in the future.   These are the goals we discussed:  Goals      Blood Pressure < 140/90     Weight < 200 lb (90.719 kg)     Has tried multiple diet programs in past without persistent success.  Has consulted with nutritionist in past.         This is a list of the screening recommended for you and due dates:  Health Maintenance  Topic Date Due   Colon Cancer Screening  04/20/2020   COVID-19 Vaccine (5 - 2023-24 season) 05/12/2022   Flu Shot  12/10/2022*   Medicare Annual Wellness Visit  12/07/2023   Mammogram  05/12/2024   DEXA scan (bone density measurement)  05/12/2024   DTaP/Tdap/Td vaccine (4 - Td or Tdap) 09/13/2026   Pneumonia Vaccine  Completed   Hepatitis C Screening: USPSTF Recommendation to screen - Ages 15-79 yo.  Completed   Zoster (Shingles) Vaccine  Completed   HPV Vaccine  Aged Out  *Topic was postponed. The date shown is not the original due date.     Wilson Singer,  CMA   12/07/2022  Nurse Notes:Approximately 30 minute Non-Face -To-Face Medicare Wellness Visit

## 2022-12-07 ENCOUNTER — Ambulatory Visit (INDEPENDENT_AMBULATORY_CARE_PROVIDER_SITE_OTHER): Payer: Medicare Other

## 2022-12-07 DIAGNOSIS — Z0000 Encounter for general adult medical examination without abnormal findings: Secondary | ICD-10-CM

## 2022-12-11 ENCOUNTER — Ambulatory Visit (INDEPENDENT_AMBULATORY_CARE_PROVIDER_SITE_OTHER): Payer: Medicare Other | Admitting: Family Medicine

## 2022-12-11 ENCOUNTER — Encounter (INDEPENDENT_AMBULATORY_CARE_PROVIDER_SITE_OTHER): Payer: Self-pay | Admitting: Family Medicine

## 2022-12-11 VITALS — BP 113/81 | HR 95 | Temp 97.8°F | Ht 66.0 in | Wt 199.0 lb

## 2022-12-11 DIAGNOSIS — R0602 Shortness of breath: Secondary | ICD-10-CM

## 2022-12-11 DIAGNOSIS — E7800 Pure hypercholesterolemia, unspecified: Secondary | ICD-10-CM

## 2022-12-11 DIAGNOSIS — E039 Hypothyroidism, unspecified: Secondary | ICD-10-CM | POA: Diagnosis not present

## 2022-12-11 DIAGNOSIS — R739 Hyperglycemia, unspecified: Secondary | ICD-10-CM | POA: Insufficient documentation

## 2022-12-11 DIAGNOSIS — E669 Obesity, unspecified: Secondary | ICD-10-CM | POA: Insufficient documentation

## 2022-12-11 DIAGNOSIS — Z6832 Body mass index (BMI) 32.0-32.9, adult: Secondary | ICD-10-CM

## 2022-12-11 DIAGNOSIS — Z1331 Encounter for screening for depression: Secondary | ICD-10-CM | POA: Diagnosis not present

## 2022-12-11 DIAGNOSIS — R5383 Other fatigue: Secondary | ICD-10-CM | POA: Insufficient documentation

## 2022-12-11 DIAGNOSIS — E559 Vitamin D deficiency, unspecified: Secondary | ICD-10-CM | POA: Diagnosis not present

## 2022-12-11 HISTORY — DX: Shortness of breath: R06.02

## 2022-12-11 NOTE — Progress Notes (Unsigned)
Chief Complaint:   OBESITY Molly Giles (MR# [id number]) is a 67 y.o. female who presents for evaluation and treatment of obesity and related comorbidities. Current BMI is Body mass index is 32.12 kg/m. Molly Giles has been struggling with her weight for many years and has been unsuccessful in either losing weight, maintaining weight loss, or reaching her healthy weight goal.  Molly Giles is currently in the action stage of change and ready to dedicate time achieving and maintaining a healthier weight. Molly Giles is interested in becoming our patient and working on intensive lifestyle modifications including (but not limited to) diet and exercise for weight loss.  Molly Giles has been to our clinic previously. Her last visit was in 2019 and she is now a new patient again.   Molly Giles's habits were reviewed today and are as follows: {MWM WT HABITS:23461}.  Depression Screen Molly Giles's Food and Mood (modified PHQ-9) score was ***.     12/07/2022    2:14 PM  Depression screen PHQ 2/9  Decreased Interest 0  Down, Depressed, Hopeless 0  PHQ - 2 Score 0   Subjective:   1. Other fatigue Molly Giles {Actions; denies/reports/admits to:19208} daytime somnolence and {Actions; denies/reports/admits to:19208} waking up still tired. Patient has a history of symptoms of {OSA Sx:17850}. Molly Giles generally gets {numbers (fuzzy):14653} hours of sleep per night, and states that she has {sleep quality:17851}. Snoring {is/are not:32546} present. Apneic episodes {is/are not:32546} present. Epworth Sleepiness Score is ***.   2. SOBOE (shortness of breath on exertion) Molly Giles notes increasing shortness of breath with exercising and seems to be worsening over time with weight gain. She notes getting out of breath sooner with activity than she used to. This has not gotten worse recently. Molly Giles denies shortness of breath at rest or orthopnea.  3. Pure hypercholesterolemia ***  4. Acquired hypothyroidism ***  5. Vitamin D  deficiency ***  6. Hyperglycemia ***  Assessment/Plan:   1. Other fatigue Molly Giles does feel that her weight is causing her energy to be lower than it should be. Fatigue may be related to obesity, depression or many other causes. Labs will be ordered, and in the meanwhile, Shawda will focus on self care including making healthy food choices, increasing physical activity and focusing on stress reduction.  - EKG 12-Lead - Vitamin B12 - CMP14+EGFR - Hemoglobin A1c - Insulin, random - VITAMIN D 25 Hydroxy (Vit-D Deficiency, Fractures)  2. SOBOE (shortness of breath on exertion) Molly Giles does feel that she gets out of breath more easily that she used to when she exercises. Molly Giles's shortness of breath appears to be obesity related and exercise induced. She has agreed to work on weight loss and gradually increase exercise to treat her exercise induced shortness of breath. Will continue to monitor closely.  3. Pure hypercholesterolemia *** - Lipid Panel With LDL/HDL Ratio  4. Acquired hypothyroidism *** - TSH  5. Vitamin D deficiency *** - VITAMIN D 25 Hydroxy (Vit-D Deficiency, Fractures)  6. Hyperglycemia *** - Hemoglobin A1c - Insulin, random  7. Depression screening Molly Giles had a positive depression screening. Depression is commonly associated with obesity and often results in emotional eating behaviors. We will monitor this closely and work on CBT to help improve the non-hunger eating patterns. Referral to Psychology may be required if no improvement is seen as she continues in our clinic.  8. Class 1 obesity with serious comorbidity and body mass index (BMI) of 32.0 to 32.9 in adult, unspecified obesity type Molly Giles is currently in the  action stage of change and her goal is to continue with weight loss efforts. I recommend Molly Giles begin the structured treatment plan as follows:  She has agreed to the Category 1 Plan + 100 calories.  Exercise goals: No exercise has been prescribed for  now, while we concentrate on nutritional changes.   Behavioral modification strategies: increasing lean protein intake and meal planning and cooking strategies.  She was informed of the importance of frequent follow-up visits to maximize her success with intensive lifestyle modifications for her multiple health conditions. She was informed we would discuss her lab results at her next visit unless there is a critical issue that needs to be addressed sooner. Molly Giles agreed to keep her next visit at the agreed upon time to discuss these results.  Objective:   Blood pressure 113/81, pulse 95, temperature 97.8 F (36.6 C), height 5\' 6"  (1.676 m), weight 199 lb (90.3 kg), SpO2 94 %. Body mass index is 32.12 kg/m.  EKG: Normal sinus rhythm, rate 92 BPM.  Indirect Calorimeter completed today shows a VO2 of 189 and a REE of 1296.  Her calculated basal metabolic rate is [id number] thus her basal metabolic rate is worse than expected.  General: Cooperative, alert, well developed, in no acute distress. HEENT: Conjunctivae and lids unremarkable. Cardiovascular: Regular rhythm.  Lungs: Normal work of breathing. Neurologic: No focal deficits.   Lab Results  Component Value Date   CREATININE 0.84 10/11/2021   BUN 16 10/11/2021   NA 140 10/11/2021   K 4.4 10/11/2021   CL 101 10/11/2021   CO2 26 10/11/2021   Lab Results  Component Value Date   ALT 18 02/12/2020   AST 22 02/12/2020   ALKPHOS 79 02/12/2020   BILITOT 0.5 02/12/2020   Lab Results  Component Value Date   HGBA1C 5.2 06/26/2019   HGBA1C 5.0 02/07/2018   HGBA1C 5.2 07/04/2017   HGBA1C 5.3 03/22/2017   Lab Results  Component Value Date   INSULIN 8.3 05/07/2018   INSULIN 11.8 07/04/2017   INSULIN 10.5 03/22/2017   Lab Results  Component Value Date   TSH 1.740 11/02/2022   Lab Results  Component Value Date   CHOL 173 10/11/2021   HDL 52 10/11/2021   LDLCALC 104 (H) 10/11/2021   TRIG 92 10/11/2021   CHOLHDL 3.3 10/11/2021    Lab Results  Component Value Date   WBC 6.4 08/04/2020   HGB 13.5 08/04/2020   HCT 41 08/04/2020   MCV 95.0 07/05/2019   PLT 262 08/04/2020   No results found for: "IRON", "TIBC", "FERRITIN"  Attestation Statements:   Reviewed by clinician on day of visit: allergies, medications, problem list, medical history, surgical history, family history, social history, and previous encounter notes.  Time spent on visit including pre-visit chart review and post-visit charting and care was 45 minutes.   I, Trixie Dredge, am acting as transcriptionist for Dennard Nip, MD.  I have reviewed the above documentation for accuracy and completeness, and I agree with the above. - ***

## 2022-12-12 LAB — INSULIN, RANDOM: INSULIN: 7.8 u[IU]/mL (ref 2.6–24.9)

## 2022-12-12 LAB — CMP14+EGFR
ALT: 20 IU/L (ref 0–32)
AST: 19 IU/L (ref 0–40)
Albumin/Globulin Ratio: 2.3 — ABNORMAL HIGH (ref 1.2–2.2)
Albumin: 4.5 g/dL (ref 3.9–4.9)
Alkaline Phosphatase: 81 IU/L (ref 44–121)
BUN/Creatinine Ratio: 18 (ref 12–28)
BUN: 16 mg/dL (ref 8–27)
Bilirubin Total: 0.3 mg/dL (ref 0.0–1.2)
CO2: 19 mmol/L — ABNORMAL LOW (ref 20–29)
Calcium: 9.4 mg/dL (ref 8.7–10.3)
Chloride: 103 mmol/L (ref 96–106)
Creatinine, Ser: 0.88 mg/dL (ref 0.57–1.00)
Globulin, Total: 2 g/dL (ref 1.5–4.5)
Glucose: 84 mg/dL (ref 70–99)
Potassium: 4 mmol/L (ref 3.5–5.2)
Sodium: 138 mmol/L (ref 134–144)
Total Protein: 6.5 g/dL (ref 6.0–8.5)
eGFR: 72 mL/min/{1.73_m2} (ref >59)

## 2022-12-12 LAB — LIPID PANEL WITH LDL/HDL RATIO
Cholesterol, Total: 176 mg/dL (ref 100–199)
HDL: 54 mg/dL (ref >39)
LDL Chol Calc (NIH): 101 mg/dL — ABNORMAL HIGH (ref 0–99)
LDL/HDL Ratio: 1.9 ratio (ref 0.0–3.2)
Triglycerides: 117 mg/dL (ref 0–149)
VLDL Cholesterol Cal: 21 mg/dL (ref 5–40)

## 2022-12-12 LAB — VITAMIN B12: Vitamin B-12: 269 pg/mL (ref 232–1245)

## 2022-12-12 LAB — TSH: TSH: 4.96 u[IU]/mL — ABNORMAL HIGH (ref 0.450–4.500)

## 2022-12-12 LAB — HEMOGLOBIN A1C
Est. average glucose Bld gHb Est-mCnc: 105 mg/dL
Hgb A1c MFr Bld: 5.3 % (ref 4.8–5.6)

## 2022-12-12 LAB — VITAMIN D 25 HYDROXY (VIT D DEFICIENCY, FRACTURES): Vit D, 25-Hydroxy: 41.2 ng/mL (ref 30.0–100.0)

## 2022-12-12 NOTE — Telephone Encounter (Signed)
Called [employer] and spoke w/ [place]. Appeal approved 11/27/22-11/30/23. Auth# [id number].  Call ref# [id number].

## 2022-12-19 ENCOUNTER — Ambulatory Visit (INDEPENDENT_AMBULATORY_CARE_PROVIDER_SITE_OTHER): Payer: Medicare Other | Admitting: Neurology

## 2022-12-19 DIAGNOSIS — R519 Headache, unspecified: Secondary | ICD-10-CM

## 2022-12-19 DIAGNOSIS — G4733 Obstructive sleep apnea (adult) (pediatric): Secondary | ICD-10-CM | POA: Diagnosis not present

## 2022-12-19 DIAGNOSIS — G44011 Episodic cluster headache, intractable: Secondary | ICD-10-CM

## 2022-12-19 DIAGNOSIS — F1310 Sedative, hypnotic or anxiolytic abuse, uncomplicated: Secondary | ICD-10-CM

## 2022-12-19 DIAGNOSIS — F513 Sleepwalking [somnambulism]: Secondary | ICD-10-CM

## 2022-12-20 NOTE — Progress Notes (Signed)
[facility]    HOME SLEEP TEST REPORT ( by Watch PAT)   STUDY DATE:  12-19-2022 DOB:  [birth year] MRN: [id number]    ORDERING CLINICIAN: Melvyn Novas, MD  REFERRING CLINICIAN: Naomie Dean, MD    CLINICAL INFORMATION/HISTORY: sleep related headache patient, seen last on 11-07-2022 .  "Migraine with aura , headaches wake her from sleep and are present in the morning when she wakes up . She had Covid 08-2022 and since has had an increase in HA frequency, up to 5 times a week. Also having neck pain.  "   I have the pleasure of seeing Kijuana Ruppel Hallett again on 11/07/22 , a right -handed female with a possible sleep disorder.  She had been scheduled for in -lab PSG sleep study at [facility], but couldn't get enough sleep with Ambien. She is interested in a HST.  She had slept less than 4 hours and in those had no apnea, only minimal hypoxia. She is still waking up with a dry mouth and HA".    Epworth sleepiness score: 5 /24. Fatigue SS at 59/ 63 points - very high   BMI: 32.8 kg/m Neck Circumference: 15.5   Sleep Summary:   Total Recording Time (hours, min):     11 h and 15 m Total Sleep Time (hours, min):             9 h and 39 m Percent REM (%):  21.6%                                       Respiratory Indices:   Total AHI :  65.6 /h REM AHI:   55.8/h                               NREM pAHI: 68/h                            Positional AHI:  99% of sleep were supine.  Mean snoring Volume was 46 dB.                                                Oxygen Saturation Statistics:         O2 Saturation Range (%):   between a nadir at 67% (!) and a maximum saturation at 98 %, with a mean  saturation at 92%.                                  O2 Saturation (minutes) <89%:  27 minutes (!) O2 saturation (minutes) < 90%:  38 minutes        Pulse Rate Statistics:   Pulse Mean (bpm):   79 bpm            Pulse Range:  between 57 and 118 bpm.                 IMPRESSION:  This HST confirms the presence of severe sleep apnea ( AHI 65.6/h) with clinically relevant hypoxic event and without REM sleep dependency.  RECOMMENDATION: This hypoxia associated severe sleep apnea requires PAP therapy.  There is no alternative treatment.  I will order an auto CPAP device with a setting from 5 through 16 cm water 2 cm EPR, heated humidification and interface of patient's choice ( DME : fitting in reclined position, please).   ONO to follow within 30 days of CPAP use, and while on CPAP for the night of the test.    INTERPRETING PHYSICIAN:  Melvyn Novas, MD   Board certified in Neurology and Sleep Medicine , and AASM accredited [facility].

## 2022-12-22 DIAGNOSIS — G4733 Obstructive sleep apnea (adult) (pediatric): Secondary | ICD-10-CM | POA: Insufficient documentation

## 2022-12-22 HISTORY — DX: Obstructive sleep apnea (adult) (pediatric): G47.33

## 2022-12-22 NOTE — Procedures (Signed)
[name]    HOME SLEEP TEST REPORT ( by Watch PAT)   STUDY DATE:  12-19-2022 DOB:  [birth year] MRN: [id number]    ORDERING CLINICIAN: Melvyn Novas, MD  REFERRING CLINICIAN: Naomie Dean, MD    CLINICAL INFORMATION/HISTORY: sleep related headache patient, seen last on 11-07-2022 .  "Migraine with aura , headaches wake her from sleep and are present in the morning when she wakes up . She had Covid 08-2022 and since has had an increase in HA frequency, up to 5 times a week. Also having neck pain.  "   I have the pleasure of seeing Molly Giles again on 11/07/22 , a right -handed female with a possible sleep disorder.  She had been scheduled for in -lab PSG sleep study at [facility], but couldn't get enough sleep with Ambien. She is interested in a HST.  She had slept less than 4 hours and in those had no apnea, only minimal hypoxia. She is still waking up with a dry mouth and HA".    Epworth sleepiness score: 5 /24. Fatigue SS at 59/ 63 points - very high   BMI: 32.8 kg/m Neck Circumference: 15.5   Sleep Summary:   Total Recording Time (hours, min):     11 h and 15 m Total Sleep Time (hours, min):             9 h and 39 m Percent REM (%):  21.6%                                       Respiratory Indices:   Total AHI :  65.6 /h REM AHI:   55.8/h                               NREM pAHI: 68/h                            Positional AHI:  99% of sleep were supine.  Mean snoring Volume was 46 dB.                                                Oxygen Saturation Statistics:         O2 Saturation Range (%):   between a nadir at 67% (!) and a maximum saturation at 98 %, with a mean  saturation at 92%.                                  O2 Saturation (minutes) <89%:  27 minutes (!) O2 saturation (minutes) < 90%:  38 minutes        Pulse Rate Statistics:   Pulse Mean (bpm):   79 bpm            Pulse Range:  between 57 and 118 bpm.                IMPRESSION:  This  HST confirms the presence of severe sleep apnea ( AHI 65.6/h) with clinically relevant hypoxic event and without REM sleep dependency.   RECOMMENDATION: This hypoxia associated severe  sleep apnea requires PAP therapy.  There is no alternative treatment.  I will order an auto CPAP device with a setting from 5 through 16 cm water 2 cm EPR, heated humidification and interface of patient's choice ( DME : fitting in reclined position, please).   ONO to follow within 30 days of CPAP use, and while on CPAP for the night of the test.    INTERPRETING PHYSICIAN:  Melvyn Novas, MD   Board certified in Neurology and Sleep Medicine , and AASM accredited [facility].

## 2022-12-25 ENCOUNTER — Ambulatory Visit (INDEPENDENT_AMBULATORY_CARE_PROVIDER_SITE_OTHER): Payer: Medicare Other | Admitting: Family Medicine

## 2022-12-25 NOTE — Telephone Encounter (Signed)
I called Molly Giles. I advised Molly Giles that Dr. Vickey Huger reviewed their sleep study results and found that Molly Giles has sever OSA. Dr. Vickey Huger recommends that Molly Giles starts autoPAP. I reviewed PAP compliance expectations with the Molly Giles. Molly Giles is agreeable to starting a CPAP. I advised Molly Giles that an order will be sent to a DME, [facility], and [facility] will call the Molly Giles within about one week after they file with the Molly Giles's insurance. [facility] will show the Molly Giles how to use the machine, fit for masks, and troubleshoot the CPAP if needed. A follow up appt was made for insurance purposes with Margie Ege on 03/28/23 1245. Molly Giles verbalized understanding to arrive 15 minutes early and bring their CPAP. Molly Giles verbalized understanding of results. Molly Giles had no questions at this time but was encouraged to call back if questions arise. I have sent the order to [facility] and have received confirmation that they have received the order.

## 2022-12-27 ENCOUNTER — Ambulatory Visit (INDEPENDENT_AMBULATORY_CARE_PROVIDER_SITE_OTHER): Payer: Medicare Other | Admitting: Family Medicine

## 2022-12-27 ENCOUNTER — Encounter (INDEPENDENT_AMBULATORY_CARE_PROVIDER_SITE_OTHER): Payer: Self-pay | Admitting: Family Medicine

## 2022-12-27 VITALS — BP 113/81 | HR 99 | Temp 97.6°F | Ht 66.0 in | Wt 195.0 lb

## 2022-12-27 DIAGNOSIS — E559 Vitamin D deficiency, unspecified: Secondary | ICD-10-CM | POA: Diagnosis not present

## 2022-12-27 DIAGNOSIS — E669 Obesity, unspecified: Secondary | ICD-10-CM | POA: Diagnosis not present

## 2022-12-27 DIAGNOSIS — Z6830 Body mass index (BMI) 30.0-30.9, adult: Secondary | ICD-10-CM | POA: Insufficient documentation

## 2022-12-27 DIAGNOSIS — G4733 Obstructive sleep apnea (adult) (pediatric): Secondary | ICD-10-CM

## 2022-12-27 DIAGNOSIS — Z6831 Body mass index (BMI) 31.0-31.9, adult: Secondary | ICD-10-CM

## 2022-12-27 DIAGNOSIS — E88819 Insulin resistance, unspecified: Secondary | ICD-10-CM | POA: Diagnosis not present

## 2022-12-27 HISTORY — DX: Obstructive sleep apnea (adult) (pediatric): G47.33

## 2022-12-28 MED ORDER — VITAMIN D (ERGOCALCIFEROL) 1.25 MG (50000 UNIT) PO CAPS
50000.0000 [IU] | ORAL_CAPSULE | ORAL | 0 refills | Status: DC
Start: 2022-12-28 — End: 2023-01-10

## 2023-01-01 ENCOUNTER — Ambulatory Visit: Payer: Medicare Other | Attending: Adult Reconstructive Orthopaedic Surgery

## 2023-01-01 ENCOUNTER — Other Ambulatory Visit: Payer: Self-pay

## 2023-01-01 DIAGNOSIS — M6281 Muscle weakness (generalized): Secondary | ICD-10-CM | POA: Diagnosis present

## 2023-01-01 DIAGNOSIS — M25551 Pain in right hip: Secondary | ICD-10-CM | POA: Diagnosis present

## 2023-01-01 DIAGNOSIS — R252 Cramp and spasm: Secondary | ICD-10-CM | POA: Insufficient documentation

## 2023-01-01 DIAGNOSIS — M62838 Other muscle spasm: Secondary | ICD-10-CM | POA: Insufficient documentation

## 2023-01-01 DIAGNOSIS — R262 Difficulty in walking, not elsewhere classified: Secondary | ICD-10-CM

## 2023-01-01 DIAGNOSIS — M25651 Stiffness of right hip, not elsewhere classified: Secondary | ICD-10-CM | POA: Diagnosis present

## 2023-01-01 NOTE — Progress Notes (Signed)
Chief Complaint:   OBESITY Molly Giles is here to discuss her progress with her obesity treatment plan along with follow-up of her obesity related diagnoses. Molly Giles is on the Category 1 Plan + 100 calories and states she is following her eating plan approximately 95% of the time. Molly Giles states she is doing 0 minutes 0 times per week.  Today's visit was #: 2 Starting weight: 199 lbs Starting date: 12/11/2022 Today's weight: 195 lbs Today's date: 12/27/2022 Total lbs lost to date: 4 Total lbs lost since last in-office visit: 4  Interim History: Molly Giles has done well with her weight loss.  She struggled with increased p.m. snacking, but overall she followed her plan well.  Subjective:   1. OSA (obstructive sleep apnea) Molly Giles had a sleep study, which showed AHI at >60.  She is waiting for her CPAP.  2. Vitamin D deficiency Molly Giles has a new diagnosis of vitamin D deficiency.  Her recent vitamin D level was low even on OTC vitamin D supplementation, and she notes fatigue.  I discussed labs with the patient today.  3. Insulin resistance Molly Giles is doing well with her diet, and her fasting insulin is mildly elevated.  I discussed labs with the patient today.  Assessment/Plan:   1. OSA (obstructive sleep apnea) Molly Giles will start her CPAP as soon as possible, and she will continue to work on her diet and weight loss.  2. Vitamin D deficiency Molly Giles agreed to start prescription vitamin D 50,000 IU once weekly with no refills.  - Vitamin D, Ergocalciferol, (DRISDOL) 1.25 MG (50000 UNIT) CAPS capsule; Take 1 capsule (50,000 Units total) by mouth every 7 (seven) days.  Dispense: 5 capsule; Refill: 0  3. Insulin resistance We discussed the importance of protein and decreasing simple carbohydrates, and we will continue to follow closely.  4. BMI 31.0-31.9,adult  5. Obesity, Beginning BMI 32.1 Molly Giles is currently in the action stage of change. As such, her goal is to continue with weight loss efforts.  She has agreed to the Category 1 Plan + 100 calories.   Eating out strategies were discussed.   Behavioral modification strategies: increasing lean protein intake and meal planning and cooking strategies.  Molly Giles has agreed to follow-up with our clinic in 4 weeks. She was informed of the importance of frequent follow-up visits to maximize her success with intensive lifestyle modifications for her multiple health conditions.   Objective:   Blood pressure 113/81, pulse 99, temperature 97.6 F (36.4 C), height  (1.676 m), weight 195 lb (88.5 kg), SpO2 95 %. Body mass index is 31.47 kg/m.  Lab Results  Component Value Date   CREATININE 0.88 12/11/2022   BUN 16 12/11/2022   NA 138 12/11/2022   K 4.0 12/11/2022   CL 103 12/11/2022   CO2 19 (L) 12/11/2022   Lab Results  Component Value Date   ALT 20 12/11/2022   AST 19 12/11/2022   ALKPHOS 81 12/11/2022   BILITOT 0.3 12/11/2022   Lab Results  Component Value Date   HGBA1C 5.3 12/11/2022   HGBA1C 5.2 06/26/2019   HGBA1C 5.0 02/07/2018   HGBA1C 5.2 07/04/2017   HGBA1C 5.3 03/22/2017   Lab Results  Component Value Date   INSULIN 7.8 12/11/2022   INSULIN 8.3 05/07/2018   INSULIN 11.8 07/04/2017   INSULIN 10.5 03/22/2017   Lab Results  Component Value Date   TSH 4.960 (H) 12/11/2022   Lab Results  Component Value Date   CHOL 176 12/11/2022  HDL 54 12/11/2022   LDLCALC 101 (H) 12/11/2022   TRIG 117 12/11/2022   CHOLHDL 3.3 10/11/2021   Lab Results  Component Value Date   VD25OH 41.2 12/11/2022   VD25OH 43.3 05/07/2018   VD25OH 48.5 07/04/2017   Lab Results  Component Value Date   WBC 6.4 08/04/2020   HGB 13.5 08/04/2020   HCT 41 08/04/2020   MCV 95.0 07/05/2019   PLT 262 08/04/2020   No results found for: "IRON", "TIBC", "FERRITIN"  Attestation Statements:   Reviewed by clinician on day of visit: allergies, medications, problem list, medical history, surgical history, family history, social history,  and previous encounter notes.  Time spent on visit including pre-visit chart review and post-visit care and charting was 48 minutes.   I, Burt Knack, am acting as transcriptionist for Quillian Quince, MD.  I have reviewed the above documentation for accuracy and completeness, and I agree with the above. -  Quillian Quince, MD

## 2023-01-01 NOTE — Therapy (Signed)
OUTPATIENT PHYSICAL THERAPY LOWER EXTREMITY EVALUATION   Patient Name: Molly Giles MRN: [id number] DOB:1956/04/23, 67 y.o., female Today's Date: 01/01/2023  END OF SESSION:  PT End of Session - 01/01/23 1628     Visit Number 1    Date for PT Re-Evaluation 02/26/23    Authorization Type Medicare A and B    Progress Note Due on Visit 10    PT Start Time 1620    PT Stop Time 1700    PT Time Calculation (min) 40 min    Activity Tolerance Patient tolerated treatment well    Behavior During Therapy [facility] for tasks assessed/performed             Past Medical History:  Diagnosis Date   Abdominal bloating 05/10/2021   Abdominal discomfort, generalized 05/07/2016   Acute gastritis 11/19/2020   Allergic rhinoconjunctivitis 07/02/2015   Anal fissure 11/19/2020   Anterior to posterior tear of superior glenoid labrum of left shoulder 02/22/2021   Arthritis    Benign positional vertigo 04/2015   Responded well to [facility]   Bruxism (teeth grinding)    Chronic contact dermatitis 08/06/2018   Per allergist Dr Edwyna Ready.    Chronic migraine 02/25/2016   Per Dr Lucia Gaskins review in notes from [facility] from September 2015. Showed total headache days last month 18. Severe headache days 7 days. Moderate headache days 5 days. Mild headache days last month sick days. Days without headache last month 10 days. Symptoms associated with photophobia, phonophobia, osmophobia, neck pain, dizziness, jaw pain, nasal congestion, vision disturbances, tingling and numbness, weakness and worsening with activity. Each headache attack last 3 hours depending on treatment in severity. Left side, the right side, easier side, the frontal area in the back of the head. Characterized as throbbing, pressure, tightness, squeezing, stabbing and burning    Chronic migraine without aura 05/15/2013    Dr Lucia Gaskins [facility]   Chronic tonsillitis 01/27/2019   DDD (degenerative  disc disease), cervical 11/18/2018   DDD (degenerative disc disease), lumbar    Depression    Disc displacement, lumbar    Dysphagia 10/11/2021   Dysrhythmia    seen by dr Donnie Aho- not a problem since she has been on Bystolic   Episodic cluster headache, not intractable 03/06/2017   Essential hypertension 05/15/2013   Family history of adverse reaction to anesthesia    Brother- N/V   Family history of premature CAD 05/15/2013   Fibromyalgia syndrome 07/01/2015   Management by Dr Kathie Rhodes. Devashwar (Rheum)    GERD (gastroesophageal reflux disease) 12/16/2014   H/O seasonal allergies    Hallux rigidus of right foot 09/25/2022   Hammer toe    Left great toe   Hashimoto's thyroiditis    Per patient, diagnosed by Dr. Ferdinand Cava   Hearing loss of both ears 07/27/2015   mild to borderline moderate low frequency hearing loss improving to within normal limits bilaterally on audiology testing at [facility] in November 2016.     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   Hyperlipidemia 1998   Hypokalemia due to excessive gastrointestinal loss of potassium 07/28/2019   Hypothyroidism    Impairment of balance 02/2015  Consequent of postconcussive syndrome   Injury of triangular fibrocartilage complex of left wrist 02/25/2019   Dx 02/25/19 Bradly Bienenstock IV MD ([facility])   Insulin resistance 07/04/2017   Internal hemorrhoid 01/28/2021   Internal hemorrhoid seen on colonoscopy 10/2020 Roderic Scarce MD [facility])   Interstitial cystitis    Irritable bowel syndrome with diarrhea 04/03/2016   Left ventricular hypertrophy, mild 02/25/2016   ECHOcardiogram report 06/17/15 showing EF55-60%, mild  LVH and G1DD    Loose total hip arthroplasty 03/10/2018   [facility]   Lumbar facet joint pain    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   Mood disorder    Morbid obesity 03/06/2017   Musculoskeletal neck pain 07/14/2015   Nocturnal hypoxemia 03/06/2017   Normal coronary arteries 05/14/2014   Obesity (BMI 30.0-34.9) 01/29/2017   Odynophagia 12/20/2021   Osteoarthritis of left hip 11/01/2015   MRI order by Dr Charlann Boxer (ortho) 10/2015 showed significant arthritis of left hip joint with cystic changes in femoral head c/w osteoarthritis   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, lumbar region 10/30/2011   Other insomnia 11/09/2016   Overweight    Pain in joint of left shoulder 11/09/2016   Pain in joint, multiple sites 11/18/2018   Palpitations 05/15/2013   [facility] manages   Perianal candidiasis 12/20/2021   Periodic limb movement sleep disorder 03/28/2017   Perirectal cyst 05/07/2016   Poison ivy dermatitis 03/24/2021   PONV (postoperative nausea and vomiting)    Positive ANA (antinuclear antibody) 11/18/2018   Post concussion syndrome 06/06/2015   Post concussive syndrome 07/14/2015   Ms Puff's post-concussive syndrome manifesting in vertigo and headache, mood changes, poor balance, dizziness, and decreased concentration per Dr Lucia Gaskins at [facility].    Posterior vitreous detachment of right eye 2014   Premature menopause 12/20/2021   Rectal abscess 05/10/2021   S/P left THA, AA 07/25/2016   Sacroiliac inflammation 08/18/2021   Shingles    Shortness of breath dyspnea    with exertion   Sicca syndrome 11/18/2018   (+) ANA   Sleep walking and eating 03/06/2017   Snoring 03/06/2017   Spondylosis of lumbar region without myelopathy or radiculopathy 10/30/2011   TFC (triangular fibrocartilage complex) injury 02/25/2019   Thyroid nodule 08/11/2009   Findings: The thyroid gland is within normal limits in size.  The gland is diffusely  inhomogeneous. A small solid nodule is noted in the lower pole  medially on the right of 7 x 6 x 8 mm. A small solid nodule is noted inferiorly on the left of 3 x 3 x 4 mm.  IMPRESSION:  The thyroid gland is within normal limits in size with only small solid nodules present, the largest of only 8 mm in diameter on the right.     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    Trochanteric bursitis, right hip 04/26/2020   Tubular adenoma of colon 01/28/2021   Colonoscopy screening 4 mm tubular Adenoma polyp Charlott Rakes, MD [facility])   Tubular adenoma of colon 01/28/2021   Colonoscopy screening 4 mm tubular Adenoma polyp Charlott Rakes, MD [facility])   Vasomotor symptoms due to menopause 04/19/2017   Vitamin D deficiency 05/08/2017   Vulvitis 07/22/2020   Yeast vaginitis 07/11/2019   Past Surgical History:  Procedure Laterality Date   arthroscopy Left 01/2022   with SAD and DCR, K. Supple MD   Bladder dilitation     x  3   BREAST BIOPSY Right 2011   Benign histology   CARDIOVASCULAR STRESS TEST  2000   Unremarkable per pt report   CARPOMETACARPAL JOINT ARTHROTOMY Right 2011   COLONOSCOPY     COLONOSCOPY WITH PROPOFOL N/A 04/21/2015   Procedure: COLONOSCOPY WITH PROPOFOL;  Surgeon: Jeani Hawking, MD;  Location[facility]COPY;  Service: Endoscopy;  Laterality: N/A;   CYSTOSCOPY W/ DILATION OF BLADDER N/A    EPIDURAL BLOCK INJECTION Left 04/12/2016   Left Medial Nerve Block and Left L5 ramus block, Dr Sheran Luz    EPIDURAL BLOCK INJECTION  03/21/2016   Left L3-4 medial branch block and Left L5 & dorsal ramus block    EPIDURAL BLOCK INJECTION N/A 10/25/2016   Sheran Luz, MD. Lumbar medial branch block   EPIDURAL BLOCK INJECTION N/A 02/09/2017   Sheran Luz, MD.  Bilateral L3/4 medial branch block, bilateral L5 dorsal ramus block   EPIDURAL BLOCK INJECTION N/A 07/04/2017   Sheran Luz, MD   FECAL TRANSPLANT   04/21/2015   Procedure: FECAL TRANSPLANT;  Surgeon: Jeani Hawking, MD;  Location: [facility] ENDOSCOPY;  Service: Endoscopy;;   HIP ARTHROPLASTY Left    HIP ARTHROSCOPY Left 03/06/2018   Left hip arthroplasty, redo for loose hip arthroplasty. Procedure at [facility] hospital   INJECTION HIP INTRA ARTICULAR Left 11/2015   for OA by Dr Vilma Prader IMPLANT PLACEMENT  04/2021   Floyde Parkins MD ([facility])   INTERSTIM IMPLANT REVISION N/A 09/2021   Floyde Parkins MD ([facility])   OTHER SURGICAL HISTORY Left 2016   Left L3/L4 medial nerve block and Left L5 Dorsal Ramus block Dr Rosine Abe   TOTAL HIP ARTHROPLASTY Left 07/25/2016   Procedure: LEFT TOTAL HIP ARTHROPLASTY ANTERIOR APPROACH;  Surgeon: Durene Romans, MD;  Location: [facility];  Service: Orthopedics;  Laterality: Left;   Patient Active Problem List   Diagnosis Date Noted   OSA (obstructive sleep apnea) 12/27/2022   BMI 31.0-31.9,adult 12/27/2022   Severe obstructive sleep apnea-hypopnea syndrome 12/22/2022   Hyperglycemia 12/11/2022   Depression screening 12/11/2022   Generalized obesity 12/11/2022   SOBOE (shortness of breath on exertion) 12/11/2022   Other fatigue 12/11/2022   Sleep related headaches 11/07/2022   Intractable episodic cluster headache 11/07/2022   Sleep walking and eating 11/07/2022   Ambien use disorder, mild 11/07/2022   Psychophysiological insomnia 11/07/2022   Rhinitis 11/03/2022   Hallux rigidus of right foot 09/25/2022   Morton neuroma, right 09/25/2022   Iliopsoas bursitis of left hip 05/08/2022   Chronic pain of right knee 05/08/2022   Arthrosis of hand 12/20/2021   History of Clostridioides difficile colitis, required fecal transplantation 12/20/2021   Mixed hyperlipidemia 12/20/2021   Meibomian gland dysfunction (MGD) of both eyes 10/17/2021   Nuclear sclerosis of both eyes 10/17/2021   Epiretinal membrane (ERM) of left eye 10/17/2021   OAB (overactive bladder) 03/02/2021    Osteoarthritis of acromioclavicular joint 02/22/2021   Hyperhidrosis, scalp, 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   Chronic low back pain 05/14/2019   Cervical spondylosis 11/18/2018   DDD (degenerative disc disease), lumbar 11/18/2018   Keratoconjunctivitis sicca 11/18/2018   Chronic contact dermatitis 08/06/2018   Primary osteoarthritis of left knee 06/20/2018   Loose total hip arthroplasty 11/29/2017   Insulin resistance 07/04/2017   Vitamin D deficiency 05/08/2017   Periodic limb movement sleep disorder 03/28/2017   Obesity (BMI 30.0-34.9) 01/29/2017   Other insomnia 11/09/2016   Irritable bowel syndrome with diarrhea 04/03/2016  Left ventricular hypertrophy, mild 02/25/2016   Mood disorder 07/02/2015   Allergic rhinoconjunctivitis 07/02/2015   Fibromyalgia syndrome 07/01/2015   Personal history of colonic polyps 07/01/2015   GERD (gastroesophageal reflux disease) 12/16/2014   Hypothyroidism 05/15/2013   Pure hypercholesterolemia 05/15/2013   Chronic interstitial cystitis 05/15/2013   Chronic migraine without aura 05/15/2013    PCP: McDiarmid, Leighton Roach, MD   REFERRING PROVIDER: Molly Maduro, MD   REFERRING DIAG: M25.551 Pain in right hip  THERAPY DIAG:  Pain in right hip - Plan: PT plan of care cert/re-cert  Stiffness of right hip, not elsewhere classified - Plan: PT plan of care cert/re-cert  Muscle weakness (generalized) - Plan: PT plan of care cert/re-cert  Difficulty in walking, not elsewhere classified - Plan: PT plan of care cert/re-cert  Cramp and spasm - Plan: PT plan of care cert/re-cert  Rationale for Evaluation and Treatment: Rehabilitation  ONSET DATE: 12/11/22  SUBJECTIVE:   SUBJECTIVE STATEMENT: Patient states she has hx of left hip replacement and felt her right leg was shorter after this.  She began having right lateral hip and right groin area pain over the past year.  She reports progressive decrease in overall function and is now unable to walk for more than a  few minutes before feeling pain. She also must take a cane with her when she is going to be out for a longer period of time.  She notices a trendelenburg gait when she becomes fatigued.   She would like to be able to walk and do her routine daily activities without pain.    PERTINENT HISTORY: Left hip THA PAIN:  Are you having pain?  Pain was in outer hip and in gluteal area, and rated at 4/10 sitting and increases to 6/10,  had recent injection that has helped.   PRECAUTIONS: None  WEIGHT BEARING RESTRICTIONS: No  FALLS:  Has patient fallen in last 6 months? No  LIVING ENVIRONMENT: Lives with: lives with their spouse Lives in: House/apartment   OCCUPATION: Retired, she has sleep apnea and tends to sleep in, gets up to do housework, goes out with friends.  She was walking her dog but unable currently due to the pain.   PLOF: Independent, Independent with basic ADLs, Independent with household mobility without device, Independent with community mobility without device, Independent with homemaking with ambulation, Independent with gait, and Independent with transfers  PATIENT GOALS: To get out of pain and be able to walk and be more active  NEXT MD VISIT: prn  OBJECTIVE:   DIAGNOSTIC FINDINGS:  Impression: Ultrasound  Undersurface fraying and probable low-grade partial tearing of the  distal gluteus minimus tendon at the greater trochanter with  moderate subgluteus bursitis.  PATIENT SURVEYS:  LEFS: complete next visit  COGNITION: Overall cognitive status: Within functional limits for tasks assessed     SENSATION: WFL   MUSCLE LENGTH: Hamstrings: Right 50 deg; Left 60 deg Thomas test: Right Positive; Left Positive  POSTURE: No Significant postural limitations  PALPATION: Tender over lateral right hip at greater trochanter  LOWER EXTREMITY ROM:  WFL  LOWER EXTREMITY MMT:  MMT Right eval Left eval  Hip flexion 4+ 4+  Hip extension 3+ 4  Hip abduction 3+  3+  Hip adduction 5 5  Hip internal rotation 4- 4-  Hip external rotation 4- 4-   LOWER EXTREMITY SPECIAL TESTS:  Hip special tests: Luisa Hart (FABER) test: positive , Trendelenburg test: positive , Thomas test: positive , and Ober's test: positive  FUNCTIONAL TESTS:  5 times sit to stand: 11.67 sec Timed up and go (TUG): 8.57 sec  GAIT: Distance walked: 30 Assistive device utilized:  oc and None Level of assistance: Complete Independence Comments: antalgic/trendelenburg   TODAY'S TREATMENT:                                                                                                                              DATE: 01/01/2023 Initial eval completed and initiated HEP    PATIENT EDUCATION:  Education details: Initiated HEP and educated on leg length discrepency vs muscle imbalance Person educated: Patient Education method: Programmer, multimedia, Facilities manager, Verbal cues, and Handouts Education comprehension: verbalized understanding, returned demonstration, and verbal cues required  HOME EXERCISE PROGRAM: Access Code: [email or web address] URL: [email or web address] Date: 01/01/2023 Prepared by: Mikey Kirschner  Exercises - Sidelying Hip Abduction  - 1 x daily - 7 x weekly - 2 sets - 10 reps - Clam with Resistance  - 1 x daily - 7 x weekly - 2 sets - 10 reps - Sidelying Reverse Clamshell with Resistance  - 1 x daily - 7 x weekly - 2 sets - 10 reps - Prone Hip Extension  - 1 x daily - 7 x weekly - 2 sets - 10 reps - Standing Hamstring Stretch on Chair  - 1 x daily - 7 x weekly - 1 sets - 3 reps - 30 sec hold  ASSESSMENT:  CLINICAL IMPRESSION: Patient is a 67 y.o. female who was seen today for physical therapy evaluation and treatment for right hip pain due to trochanteric bursitis and glut minimus strain.    She presents with slight tightness in right hamstring, but all other areas [facility].  She had significant weakness in glut medius, hip extensors and hip abduction.  She would  benefit from skilled PT to improve hip stability, for postural training and gait training to restore symmetrical gait and eliminate trendelenburg.    OBJECTIVE IMPAIRMENTS: Abnormal gait, difficulty walking, decreased ROM, decreased strength, increased fascial restrictions, increased muscle spasms, impaired flexibility, and pain.   ACTIVITY LIMITATIONS: bending, sitting, squatting, stairs, transfers, bed mobility, and dressing  PARTICIPATION LIMITATIONS: meal prep, cleaning, laundry, driving, shopping, community activity, and yard work  PERSONAL FACTORS: Age, Fitness, and Past/current experiences are also affecting patient's functional outcome.   REHAB POTENTIAL: Good  CLINICAL DECISION MAKING: Stable/uncomplicated  EVALUATION COMPLEXITY: Low   GOALS: Goals reviewed with patient? Yes  SHORT TERM GOALS: Target date: 01/29/2023  Pain report to be no greater than 4/10  Baseline: Goal status: INITIAL  2.  Patient will be independent with initial HEP  Baseline:  Goal status: INITIAL   LONG TERM GOALS: Target date: 02/26/2023  Patient to report pain no greater than 2/10  Baseline:  Goal status: INITIAL  2.  Patient to be independent with advanced HEP  Baseline:  Goal status: INITIAL  3.  Patient to be able to sleep through the night  Baseline:  Goal status: INITIAL  4.  Patient to be able to stand or walk for at least 15 min without right hip pain Baseline:  Goal status: INITIAL  5.  Functional scores to improve by 1-3 seconds Baseline:  Goal status: INITIAL  6.  Patient to report 85% improvement in overall symptoms  Baseline:  Goal status: INITIAL   PLAN:  PT FREQUENCY: 1-2x/week  PT DURATION: 8 weeks  PLANNED INTERVENTIONS: Therapeutic exercises, Therapeutic activity, Neuromuscular re-education, Balance training, Gait training, Patient/Family education, Self Care, Joint mobilization, Stair training, DME instructions, Aquatic Therapy, Dry Needling, Electrical  stimulation, Spinal mobilization, Cryotherapy, Moist heat, Taping, Traction, Ultrasound, Ionotophoresis 4mg /ml Dexamethasone, Manual therapy, and Re-evaluation  PLAN FOR NEXT SESSION: Nustep, review HEP, begin right hip stability training   Nnamdi Dacus B. Ermel Verne, PT 01/01/23 11:23 PM [facility] [address] [place], [place] [address] Phone # [phone] Fax [phone]

## 2023-01-03 ENCOUNTER — Ambulatory Visit: Payer: Medicare Other

## 2023-01-03 DIAGNOSIS — M25551 Pain in right hip: Secondary | ICD-10-CM | POA: Diagnosis not present

## 2023-01-03 DIAGNOSIS — M25651 Stiffness of right hip, not elsewhere classified: Secondary | ICD-10-CM

## 2023-01-03 DIAGNOSIS — R262 Difficulty in walking, not elsewhere classified: Secondary | ICD-10-CM

## 2023-01-03 DIAGNOSIS — R252 Cramp and spasm: Secondary | ICD-10-CM

## 2023-01-03 DIAGNOSIS — M6281 Muscle weakness (generalized): Secondary | ICD-10-CM

## 2023-01-03 DIAGNOSIS — M62838 Other muscle spasm: Secondary | ICD-10-CM

## 2023-01-03 NOTE — Therapy (Signed)
OUTPATIENT PHYSICAL THERAPY LOWER EXTREMITY EVALUATION   Patient Name: Molly Giles MRN: [id number] DOB:December 02, 1955, 67 y.o., female Today's Date: 01/04/2023  END OF SESSION:  PT End of Session - 01/03/23 1414     Visit Number 2    Date for PT Re-Evaluation 02/26/23    Authorization Type Medicare A and B    Progress Note Due on Visit 10    PT Start Time 1405    PT Stop Time 1445    PT Time Calculation (min) 40 min    Activity Tolerance Patient tolerated treatment well    Behavior During Therapy [facility] for tasks assessed/performed             Past Medical History:  Diagnosis Date   Abdominal bloating 05/10/2021   Abdominal discomfort, generalized 05/07/2016   Acute gastritis 11/19/2020   Allergic rhinoconjunctivitis 07/02/2015   Anal fissure 11/19/2020   Anterior to posterior tear of superior glenoid labrum of left shoulder 02/22/2021   Arthritis    Benign positional vertigo 04/2015   Responded well to [facility]   Bruxism (teeth grinding)    Chronic contact dermatitis 08/06/2018   Per allergist Dr Edwyna Ready.    Chronic migraine 02/25/2016   Per Dr Lucia Gaskins review in notes from [facility] from September 2015. Showed total headache days last month 18. Severe headache days 7 days. Moderate headache days 5 days. Mild headache days last month sick days. Days without headache last month 10 days. Symptoms associated with photophobia, phonophobia, osmophobia, neck pain, dizziness, jaw pain, nasal congestion, vision disturbances, tingling and numbness, weakness and worsening with activity. Each headache attack last 3 hours depending on treatment in severity. Left side, the right side, easier side, the frontal area in the back of the head. Characterized as throbbing, pressure, tightness, squeezing, stabbing and burning    Chronic migraine without aura 05/15/2013    Dr Lucia Gaskins [facility]   Chronic tonsillitis 01/27/2019   DDD (degenerative  disc disease), cervical 11/18/2018   DDD (degenerative disc disease), lumbar    Depression    Disc displacement, lumbar    Dysphagia 10/11/2021   Dysrhythmia    seen by dr Donnie Aho- not a problem since she has been on Bystolic   Episodic cluster headache, not intractable 03/06/2017   Essential hypertension 05/15/2013   Family history of adverse reaction to anesthesia    Brother- N/V   Family history of premature CAD 05/15/2013   Fibromyalgia syndrome 07/01/2015   Management by Dr Kathie Rhodes. Devashwar (Rheum)    GERD (gastroesophageal reflux disease) 12/16/2014   H/O seasonal allergies    Hallux rigidus of right foot 09/25/2022   Hammer toe    Left great toe   Hashimoto's thyroiditis    Per patient, diagnosed by Dr. Ferdinand Cava   Hearing loss of both ears 07/27/2015   mild to borderline moderate low frequency hearing loss improving to within normal limits bilaterally on audiology testing at [facility] in November 2016.     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   Hyperlipidemia 1998   Hypokalemia due to excessive gastrointestinal loss of potassium 07/28/2019   Hypothyroidism    Impairment of balance 02/2015  Consequent of postconcussive syndrome   Injury of triangular fibrocartilage complex of left wrist 02/25/2019   Dx 02/25/19 Bradly Bienenstock IV MD ([facility])   Insulin resistance 07/04/2017   Internal hemorrhoid 01/28/2021   Internal hemorrhoid seen on colonoscopy 10/2020 Roderic Scarce MD [facility])   Interstitial cystitis    Irritable bowel syndrome with diarrhea 04/03/2016   Left ventricular hypertrophy, mild 02/25/2016   ECHOcardiogram report 06/17/15 showing EF55-60%, mild  LVH and G1DD    Loose total hip arthroplasty 03/10/2018   [facility]   Lumbar facet joint pain    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   Mood disorder    Morbid obesity 03/06/2017   Musculoskeletal neck pain 07/14/2015   Nocturnal hypoxemia 03/06/2017   Normal coronary arteries 05/14/2014   Obesity (BMI 30.0-34.9) 01/29/2017   Odynophagia 12/20/2021   Osteoarthritis of left hip 11/01/2015   MRI order by Dr Charlann Boxer (ortho) 10/2015 showed significant arthritis of left hip joint with cystic changes in femoral head c/w osteoarthritis   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, lumbar region 10/30/2011   Other insomnia 11/09/2016   Overweight    Pain in joint of left shoulder 11/09/2016   Pain in joint, multiple sites 11/18/2018   Palpitations 05/15/2013   [facility] manages   Perianal candidiasis 12/20/2021   Periodic limb movement sleep disorder 03/28/2017   Perirectal cyst 05/07/2016   Poison ivy dermatitis 03/24/2021   PONV (postoperative nausea and vomiting)    Positive ANA (antinuclear antibody) 11/18/2018   Post concussion syndrome 06/06/2015   Post concussive syndrome 07/14/2015   Ms Czerniak's post-concussive syndrome manifesting in vertigo and headache, mood changes, poor balance, dizziness, and decreased concentration per Dr Lucia Gaskins at [facility].    Posterior vitreous detachment of right eye 2014   Premature menopause 12/20/2021   Rectal abscess 05/10/2021   S/P left THA, AA 07/25/2016   Sacroiliac inflammation 08/18/2021   Shingles    Shortness of breath dyspnea    with exertion   Sicca syndrome 11/18/2018   (+) ANA   Sleep walking and eating 03/06/2017   Snoring 03/06/2017   Spondylosis of lumbar region without myelopathy or radiculopathy 10/30/2011   TFC (triangular fibrocartilage complex) injury 02/25/2019   Thyroid nodule 08/11/2009   Findings: The thyroid gland is within normal limits in size.  The gland is diffusely  inhomogeneous. A small solid nodule is noted in the lower pole  medially on the right of 7 x 6 x 8 mm. A small solid nodule is noted inferiorly on the left of 3 x 3 x 4 mm.  IMPRESSION:  The thyroid gland is within normal limits in size with only small solid nodules present, the largest of only 8 mm in diameter on the right.     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    Trochanteric bursitis, right hip 04/26/2020   Tubular adenoma of colon 01/28/2021   Colonoscopy screening 4 mm tubular Adenoma polyp Charlott Rakes, MD [facility])   Tubular adenoma of colon 01/28/2021   Colonoscopy screening 4 mm tubular Adenoma polyp Charlott Rakes, MD [facility])   Vasomotor symptoms due to menopause 04/19/2017   Vitamin D deficiency 05/08/2017   Vulvitis 07/22/2020   Yeast vaginitis 07/11/2019   Past Surgical History:  Procedure Laterality Date   arthroscopy Left 01/2022   with SAD and DCR, K. Supple MD   Bladder dilitation     x  3   BREAST BIOPSY Right 2011   Benign histology   CARDIOVASCULAR STRESS TEST  2000   Unremarkable per pt report   CARPOMETACARPAL JOINT ARTHROTOMY Right 2011   COLONOSCOPY     COLONOSCOPY WITH PROPOFOL N/A 04/21/2015   Procedure: COLONOSCOPY WITH PROPOFOL;  Surgeon: Jeani Hawking, MD;  Location: [facility] ENDOSCOPY;  Service: Endoscopy;  Laterality: N/A;   CYSTOSCOPY W/ DILATION OF BLADDER N/A    EPIDURAL BLOCK INJECTION Left 04/12/2016   Left Medial Nerve Block and Left L5 ramus block, Dr Sheran Luz    EPIDURAL BLOCK INJECTION  03/21/2016   Left L3-4 medial branch block and Left L5 & dorsal ramus block    EPIDURAL BLOCK INJECTION N/A 10/25/2016   Sheran Luz, MD. Lumbar medial branch block   EPIDURAL BLOCK INJECTION N/A 02/09/2017   Sheran Luz, MD.  Bilateral L3/4 medial branch block, bilateral L5 dorsal ramus block   EPIDURAL BLOCK INJECTION N/A 07/04/2017   Sheran Luz, MD   FECAL TRANSPLANT   04/21/2015   Procedure: FECAL TRANSPLANT;  Surgeon: Jeani Hawking, MD;  Location: [facility] ENDOSCOPY;  Service: Endoscopy;;   HIP ARTHROPLASTY Left    HIP ARTHROSCOPY Left 03/06/2018   Left hip arthroplasty, redo for loose hip arthroplasty. Procedure at [facility] hospital   INJECTION HIP INTRA ARTICULAR Left 11/2015   for OA by Dr Vilma Prader IMPLANT PLACEMENT  04/2021   Floyde Parkins MD ([facility])   INTERSTIM IMPLANT REVISION N/A 09/2021   Floyde Parkins MD ([facility])   OTHER SURGICAL HISTORY Left 2016   Left L3/L4 medial nerve block and Left L5 Dorsal Ramus block Dr Rosine Abe   TOTAL HIP ARTHROPLASTY Left 07/25/2016   Procedure: LEFT TOTAL HIP ARTHROPLASTY ANTERIOR APPROACH;  Surgeon: Durene Romans, MD;  Location: [facility]S;  Service: Orthopedics;  Laterality: Left;   Patient Active Problem List   Diagnosis Date Noted   OSA (obstructive sleep apnea) 12/27/2022   BMI 31.0-31.9,adult 12/27/2022   Severe obstructive sleep apnea-hypopnea syndrome 12/22/2022   Hyperglycemia 12/11/2022   Depression screening 12/11/2022   Generalized obesity 12/11/2022   SOBOE (shortness of breath on exertion) 12/11/2022   Other fatigue 12/11/2022   Sleep related headaches 11/07/2022   Intractable episodic cluster headache 11/07/2022   Sleep walking and eating 11/07/2022   Ambien use disorder, mild 11/07/2022   Psychophysiological insomnia 11/07/2022   Rhinitis 11/03/2022   Hallux rigidus of right foot 09/25/2022   Morton neuroma, right 09/25/2022   Iliopsoas bursitis of left hip 05/08/2022   Chronic pain of right knee 05/08/2022   Arthrosis of hand 12/20/2021   History of Clostridioides difficile colitis, required fecal transplantation 12/20/2021   Mixed hyperlipidemia 12/20/2021   Meibomian gland dysfunction (MGD) of both eyes 10/17/2021   Nuclear sclerosis of both eyes 10/17/2021   Epiretinal membrane (ERM) of left eye 10/17/2021   OAB (overactive bladder) 03/02/2021    Osteoarthritis of acromioclavicular joint 02/22/2021   Hyperhidrosis, scalp, 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   Chronic low back pain 05/14/2019   Cervical spondylosis 11/18/2018   DDD (degenerative disc disease), lumbar 11/18/2018   Keratoconjunctivitis sicca 11/18/2018   Chronic contact dermatitis 08/06/2018   Primary osteoarthritis of left knee 06/20/2018   Loose total hip arthroplasty 11/29/2017   Insulin resistance 07/04/2017   Vitamin D deficiency 05/08/2017   Periodic limb movement sleep disorder 03/28/2017   Obesity (BMI 30.0-34.9) 01/29/2017   Other insomnia 11/09/2016   Irritable bowel syndrome with diarrhea 04/03/2016  Left ventricular hypertrophy, mild 02/25/2016   Mood disorder 07/02/2015   Allergic rhinoconjunctivitis 07/02/2015   Fibromyalgia syndrome 07/01/2015   Personal history of colonic polyps 07/01/2015   GERD (gastroesophageal reflux disease) 12/16/2014   Hypothyroidism 05/15/2013   Pure hypercholesterolemia 05/15/2013   Chronic interstitial cystitis 05/15/2013   Chronic migraine without aura 05/15/2013    PCP: McDiarmid, Leighton Roach, MD   REFERRING PROVIDER: Molly Maduro, MD   REFERRING DIAG: M25.551 Pain in right hip  THERAPY DIAG:  Pain in right hip  Stiffness of right hip, not elsewhere classified  Muscle weakness (generalized)  Difficulty in walking, not elsewhere classified  Cramp and spasm  Other muscle spasm  Rationale for Evaluation and Treatment: Rehabilitation  ONSET DATE: 12/11/22  SUBJECTIVE:   SUBJECTIVE STATEMENT: Patient states her pain is about the same but she is able to walk without a cane now.    PERTINENT HISTORY: Left hip THA PAIN:  Are you having pain?  4-6/10  PRECAUTIONS: None  WEIGHT BEARING RESTRICTIONS: No  FALLS:  Has patient fallen in last 6 months? No  LIVING ENVIRONMENT: Lives with: lives with their spouse Lives in: House/apartment   OCCUPATION: Retired, she has sleep apnea and tends to sleep in, gets up to  do housework, goes out with friends.  She was walking her dog but unable currently due to the pain.   PLOF: Independent, Independent with basic ADLs, Independent with household mobility without device, Independent with community mobility without device, Independent with homemaking with ambulation, Independent with gait, and Independent with transfers  PATIENT GOALS: To get out of pain and be able to walk and be more active  NEXT MD VISIT: prn  OBJECTIVE:   DIAGNOSTIC FINDINGS:  Impression: Ultrasound  Undersurface fraying and probable low-grade partial tearing of the  distal gluteus minimus tendon at the greater trochanter with  moderate subgluteus bursitis.  PATIENT SURVEYS:  LEFS: complete next visit  COGNITION: Overall cognitive status: Within functional limits for tasks assessed     SENSATION: WFL   MUSCLE LENGTH: Hamstrings: Right 50 deg; Left 60 deg Thomas test: Right Positive; Left Positive  POSTURE: No Significant postural limitations  PALPATION: Tender over lateral right hip at greater trochanter  LOWER EXTREMITY ROM:  WFL  LOWER EXTREMITY MMT:  MMT Right eval Left eval  Hip flexion 4+ 4+  Hip extension 3+ 4  Hip abduction 3+ 3+  Hip adduction 5 5  Hip internal rotation 4- 4-  Hip external rotation 4- 4-   LOWER EXTREMITY SPECIAL TESTS:  Hip special tests: Luisa Hart (FABER) test: positive , Trendelenburg test: positive , Thomas test: positive , and Ober's test: positive   FUNCTIONAL TESTS:  5 times sit to stand: 11.67 sec Timed up and go (TUG): 8.57 sec  GAIT: Distance walked: 30 Assistive device utilized:  oc and None Level of assistance: Complete Independence Comments: antalgic/trendelenburg   TODAY'S TREATMENT:  DATE: 01/03/2023 Nustep x 5 min level 5 Seated clam with blue loop x 20 Seated LAQ x 20 with 2 lbs Seated  march x 20 with 2 lbs Seated Hip ER with 2 lbs x 20 Sit to stand  Hip matrix hip extension and abduction  with 25 lbs 4 sets of 5 (both) Manual: rolling and theragun to right IT band x 8-9 min  DATE: 01/01/2023 Initial eval completed and initiated HEP    PATIENT EDUCATION:  Education details: Initiated HEP and educated on leg length discrepency vs muscle imbalance Person educated: Patient Education method: Programmer, multimedia, Facilities manager, Verbal cues, and Handouts Education comprehension: verbalized understanding, returned demonstration, and verbal cues required  HOME EXERCISE PROGRAM: Access Code: [email or web address] URL: [email or web address] Date: 01/01/2023 Prepared by: Mikey Kirschner  Exercises - Sidelying Hip Abduction  - 1 x daily - 7 x weekly - 2 sets - 10 reps - Clam with Resistance  - 1 x daily - 7 x weekly - 2 sets - 10 reps - Sidelying Reverse Clamshell with Resistance  - 1 x daily - 7 x weekly - 2 sets - 10 reps - Prone Hip Extension  - 1 x daily - 7 x weekly - 2 sets - 10 reps - Standing Hamstring Stretch on Chair  - 1 x daily - 7 x weekly - 1 sets - 3 reps - 30 sec hold  ASSESSMENT:  CLINICAL IMPRESSION: Maylyn fatigued very easily with hip matrix and had to do sets of 5.  She has obvious proximal hip weakness and poor core strength.  She would benefit from skilled PT to improve hip stability and core strengthening, postural training and gait training to restore symmetrical gait and eliminate trendelenburg.    OBJECTIVE IMPAIRMENTS: Abnormal gait, difficulty walking, decreased ROM, decreased strength, increased fascial restrictions, increased muscle spasms, impaired flexibility, and pain.   ACTIVITY LIMITATIONS: bending, sitting, squatting, stairs, transfers, bed mobility, and dressing  PARTICIPATION LIMITATIONS: meal prep, cleaning, laundry, driving, shopping, community activity, and yard work  PERSONAL FACTORS: Age, Fitness, and Past/current experiences are also  affecting patient's functional outcome.   REHAB POTENTIAL: Good  CLINICAL DECISION MAKING: Stable/uncomplicated  EVALUATION COMPLEXITY: Low   GOALS: Goals reviewed with patient? Yes  SHORT TERM GOALS: Target date: 01/29/2023  Pain report to be no greater than 4/10  Baseline: Goal status: INITIAL  2.  Patient will be independent with initial HEP  Baseline:  Goal status: INITIAL   LONG TERM GOALS: Target date: 02/26/2023  Patient to report pain no greater than 2/10  Baseline:  Goal status: INITIAL  2.  Patient to be independent with advanced HEP  Baseline:  Goal status: INITIAL  3.  Patient to be able to sleep through the night  Baseline:  Goal status: INITIAL  4.  Patient to be able to stand or walk for at least 15 min without right hip pain Baseline:  Goal status: INITIAL  5.  Functional scores to improve by 1-3 seconds Baseline:  Goal status: INITIAL  6.  Patient to report 85% improvement in overall symptoms  Baseline:  Goal status: INITIAL   PLAN:  PT FREQUENCY: 1-2x/week  PT DURATION: 8 weeks  PLANNED INTERVENTIONS: Therapeutic exercises, Therapeutic activity, Neuromuscular re-education, Balance training, Gait training, Patient/Family education, Self Care, Joint mobilization, Stair training, DME instructions, Aquatic Therapy, Dry Needling, Electrical stimulation, Spinal mobilization, Cryotherapy, Moist heat, Taping, Traction, Ultrasound, Ionotophoresis 4mg /ml Dexamethasone, Manual therapy, and Re-evaluation  PLAN FOR NEXT SESSION: Nustep, progress HEP, focus on bilateral hip stability  training and core strength.   Victorino Dike B. Sumire Halbleib, PT 01/04/23 8:04 AM  [facility] [address] [place], [place] [address] Phone # [phone] Fax [phone]

## 2023-01-08 NOTE — Telephone Encounter (Signed)
Pt says she has reached out to DME and no more than finding out of the approval, she has not heard anything about when she will get her CPAP.  Pt would like a call to discuss other options such as looking into another DME if need be.

## 2023-01-09 NOTE — Telephone Encounter (Signed)
I have reached out to [facility] for the patient. Order was sent 12/25/22 and confirmation received. Waiting to hear back from [facility] on status.

## 2023-01-10 ENCOUNTER — Other Ambulatory Visit (INDEPENDENT_AMBULATORY_CARE_PROVIDER_SITE_OTHER): Payer: Self-pay | Admitting: Physician Assistant

## 2023-01-10 ENCOUNTER — Ambulatory Visit: Payer: Medicare Other | Admitting: Physical Therapy

## 2023-01-10 ENCOUNTER — Encounter (INDEPENDENT_AMBULATORY_CARE_PROVIDER_SITE_OTHER): Payer: Self-pay | Admitting: Physician Assistant

## 2023-01-10 ENCOUNTER — Encounter: Payer: BLUE CROSS/BLUE SHIELD | Admitting: Physical Therapy

## 2023-01-10 ENCOUNTER — Ambulatory Visit (INDEPENDENT_AMBULATORY_CARE_PROVIDER_SITE_OTHER): Payer: Medicare Other | Admitting: Physician Assistant

## 2023-01-10 ENCOUNTER — Ambulatory Visit: Payer: Medicare Other | Attending: Adult Reconstructive Orthopaedic Surgery

## 2023-01-10 VITALS — BP 102/71 | HR 92 | Temp 98.1°F | Ht 66.0 in | Wt 190.0 lb

## 2023-01-10 DIAGNOSIS — E669 Obesity, unspecified: Secondary | ICD-10-CM

## 2023-01-10 DIAGNOSIS — E782 Mixed hyperlipidemia: Secondary | ICD-10-CM | POA: Diagnosis not present

## 2023-01-10 DIAGNOSIS — E88819 Insulin resistance, unspecified: Secondary | ICD-10-CM

## 2023-01-10 DIAGNOSIS — E039 Hypothyroidism, unspecified: Secondary | ICD-10-CM | POA: Diagnosis not present

## 2023-01-10 DIAGNOSIS — E559 Vitamin D deficiency, unspecified: Secondary | ICD-10-CM | POA: Diagnosis not present

## 2023-01-10 DIAGNOSIS — R252 Cramp and spasm: Secondary | ICD-10-CM | POA: Diagnosis present

## 2023-01-10 DIAGNOSIS — M25551 Pain in right hip: Secondary | ICD-10-CM | POA: Diagnosis present

## 2023-01-10 DIAGNOSIS — E538 Deficiency of other specified B group vitamins: Secondary | ICD-10-CM

## 2023-01-10 DIAGNOSIS — M6281 Muscle weakness (generalized): Secondary | ICD-10-CM | POA: Diagnosis present

## 2023-01-10 DIAGNOSIS — Z6830 Body mass index (BMI) 30.0-30.9, adult: Secondary | ICD-10-CM

## 2023-01-10 DIAGNOSIS — M62838 Other muscle spasm: Secondary | ICD-10-CM | POA: Insufficient documentation

## 2023-01-10 DIAGNOSIS — R262 Difficulty in walking, not elsewhere classified: Secondary | ICD-10-CM | POA: Diagnosis present

## 2023-01-10 DIAGNOSIS — M25651 Stiffness of right hip, not elsewhere classified: Secondary | ICD-10-CM

## 2023-01-10 HISTORY — DX: Deficiency of other specified B group vitamins: E53.8

## 2023-01-10 MED ORDER — VITAMIN D (ERGOCALCIFEROL) 1.25 MG (50000 UNIT) PO CAPS: 50000.0000 [IU] | ORAL_CAPSULE | ORAL | 0 refills | Status: DC

## 2023-01-10 MED ORDER — METFORMIN HCL 500 MG PO TABS
250.0000 mg | ORAL_TABLET | Freq: Every day | ORAL | 0 refills | Status: DC
Start: 2023-01-10 — End: 2023-01-11

## 2023-01-10 NOTE — Assessment & Plan Note (Signed)
B 12 level 269 12/11/2022. Endorses fatigue.   Plan: Continue nutrition plan which is B 12 rich diet, but also supplement with B 12 500 mcg SL once daily. Recheck B 12 level in next 3-4 months.

## 2023-01-10 NOTE — Assessment & Plan Note (Signed)
Hyperlipidemia LDL is not at goal. Medication(s): Crestor 10 mg daily (recently increased)  Has very strong family history of CAD, father deceased at 43/brother at 62 from CAD and sister with + calcium cardiac scoring.  Cardiovascular risk factors: advanced age (older than 55 for men, 65 for women), dyslipidemia, family history of premature cardiovascular disease, and obesity (BMI >= 30 kg/m2)  Lab Results  Component Value Date   CHOL 176 12/11/2022   HDL 54 12/11/2022   LDLCALC 101 (H) 12/11/2022   TRIG 117 12/11/2022   CHOLHDL 3.3 10/11/2021   CHOLHDL 3.1 02/12/2020   CHOLHDL 3.3 11/06/2019   Lab Results  Component Value Date   ALT 20 12/11/2022   AST 19 12/11/2022   ALKPHOS 81 12/11/2022   BILITOT 0.3 12/11/2022   The 10-year ASCVD risk score (Arnett DK, et al., 2019) is: 5.6%   Values used to calculate the score:     Age: 67 years     Sex: Female     Is Non-Hispanic African American: No     Diabetic: No     Tobacco smoker: No     Systolic Blood Pressure: 102 mmHg     Is BP treated: Yes     HDL Cholesterol: 54 mg/dL     Total Cholesterol: 176 mg/dL  Plan: Continue statin. Continue working on nutrition plan to promote weight loss and improve lipid profile/decrease CV risk.  Follow up lipid panel in 3-4 months.     

## 2023-01-10 NOTE — Assessment & Plan Note (Signed)
Vitamin D Deficiency Vitamin D is not at goal of 50.  Most recent vitamin D level was 41.2. Endorses fatigue.  She is on  prescription ergocalciferol 50,000 IU weekly. No side effects , no N/V or muscle weakness with Ergocalciferol.  Lab Results  Component Value Date   VD25OH 41.2 12/11/2022   VD25OH 43.3 05/07/2018   VD25OH 48.5 07/04/2017    Plan: Continue and refill  prescription ergocalciferol 50,000 IU weekly Low vitamin D levels can be associated with adiposity and may result in leptin resistance and weight gain. Also associated with fatigue. Currently on vitamin D supplementation without any adverse effects.

## 2023-01-10 NOTE — Assessment & Plan Note (Signed)
Hyperlipidemia LDL is not at goal. Medication(s): Crestor 10 mg daily (recently increased)  Has very strong family history of CAD, father deceased at 43/brother at 51 from CAD and sister with + calcium cardiac scoring.  Cardiovascular risk factors: advanced age (older than 21 for men, 97 for women), dyslipidemia, family history of premature cardiovascular disease, and obesity (BMI >= 30 kg/m2)  Lab Results  Component Value Date   CHOL 176 12/11/2022   HDL 54 12/11/2022   LDLCALC 101 (H) 12/11/2022   TRIG 117 12/11/2022   CHOLHDL 3.3 10/11/2021   CHOLHDL 3.1 02/12/2020   CHOLHDL 3.3 11/06/2019   Lab Results  Component Value Date   ALT 20 12/11/2022   AST 19 12/11/2022   ALKPHOS 81 12/11/2022   BILITOT 0.3 12/11/2022   The 10-year ASCVD risk score (Arnett DK, et al., 2019) is: 5.6%   Values used to calculate the score:     Age: 67 years     Sex: Female     Is Non-Hispanic African American: No     Diabetic: No     Tobacco smoker: No     Systolic Blood Pressure: 102 mmHg     Is BP treated: Yes     HDL Cholesterol: 54 mg/dL     Total Cholesterol: 176 mg/dL  Plan: Continue statin. Continue working on nutrition plan to promote weight loss and improve lipid profile/decrease CV risk.  Follow up lipid panel in 3-4 months.

## 2023-01-10 NOTE — Assessment & Plan Note (Signed)
Insulin Resistance Last fasting insulin was 7.8. A1c was 5.3. Polyphagia:No Medication(s): None She has been working on nutrition plan to decrease simple carbohydrates, increase lean proteins and exercise to promote weight loss, improve glycemic control and prevent progression to Type 2 diabetes.  Discussed role of insulin resistance in metabolism.  Discussed metformin for primary indication of insulin resistance. Discussed possible risks, benefits and side effects and she is agreeable to trial of metformin at this time.   Lab Results  Component Value Date   HGBA1C 5.3 12/11/2022   HGBA1C 5.2 06/26/2019   HGBA1C 5.0 02/07/2018   HGBA1C 5.2 07/04/2017   HGBA1C 5.3 03/22/2017   Lab Results  Component Value Date   INSULIN 7.8 12/11/2022   INSULIN 8.3 05/07/2018   INSULIN 11.8 07/04/2017   INSULIN 10.5 03/22/2017    Plan: Start  metformin 250 mg daily  for insulin resistance.  Continue working on nutrition plan to decrease simple carbohydrates, increase lean proteins and exercise to promote weight loss, improve glycemic control and prevent progression to Type 2 diabetes.  Increase protein intake. Increase fiber intake.

## 2023-01-10 NOTE — Progress Notes (Signed)
Office: [phone]  /  Fax: [phone]  WEIGHT SUMMARY AND BIOMETRICS  Vitals Temp: 98.1 F (36.7 C) BP: 102/71 Pulse Rate: 92 SpO2: 95 %   Anthropometric Measurements Height: 5\' 6"  (1.676 m) Weight: 190 lb (86.2 kg) BMI (Calculated): 30.68 Weight at Last Visit: 195 lb Weight Lost Since Last Visit: 5 lb Weight Gained Since Last Visit: 0 Starting Weight: 199 lb Total Weight Loss (lbs): 9 lb (4.082 kg)   Body Composition  Body Fat %: 44.6 % Fat Mass (lbs): 85 lbs Muscle Mass (lbs): 1004 lbs Total Body Water (lbs): 72.4 lbs Visceral Fat Rating : 12   Other Clinical Data Fasting: yes Labs: yes Today's Visit #: #3 Starting Date: 12/11/22     HPI  Chief Complaint: OBESITY  Molly Giles is here to discuss her progress with her obesity treatment plan. She is on the the Category 1 Plan and states she is following her eating plan approximately 95 % of the time. She states she is exercising doing physical therapy for muscle tear of hip-15-45 minutes 2-7 times per week.   Interval History:  Since last office visit she is down 5 lbs. Hunger/appetite- not excessive, not skipping meals Sleep- using Ambien for many years. 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  Exercise- Going to PT for hip injury currently and limits activity. Was using cane for ambulation, but has been able to discontinue using the cane now.  Hydration- Good intake.  Has vacation to [facility] later this month and we discussed travel strategies and eating out strategies- for dinners ( eating protein prior to going out and trying to stay on plan for breakfast and lunches)  Pharmacotherapy: None for weight loss.  Discussed metformin for primary indication of insulin resistance. Discussed possible risks, benefits and side effects and she is agreeable to trial of metformin at this time.   PHYSICAL EXAM:  Blood pressure 102/71, pulse 92, temperature 98.1 F (36.7 C), height 5\' 6"  (1.676  m), weight 190 lb (86.2 kg), SpO2 95 %. Body mass index is 30.67 kg/m.  General: She is overweight, cooperative, alert, well developed, and in no acute distress. Ambulates without assistive device today.  PSYCH: Has normal mood, affect and thought process.   Cardiovascular: HR 90's regular Lungs: Normal breathing effort, no conversational dyspnea. Neuro: no focal deficits.   DIAGNOSTIC DATA REVIEWED:  BMET    Component Value Date/Time   NA 138 12/11/2022 1105   K 4.0 12/11/2022 1105   CL 103 12/11/2022 1105   CO2 19 (L) 12/11/2022 1105   GLUCOSE 84 12/11/2022 1105   GLUCOSE 93 07/05/2019 1629   BUN 16 12/11/2022 1105   CREATININE 0.88 12/11/2022 1105   CREATININE 0.74 02/24/2016 1158   CALCIUM 9.4 12/11/2022 1105   GFRNONAA 65 07/24/2019 1116   GFRNONAA 88 02/24/2016 1158   GFRAA 75 07/24/2019 1116   GFRAA >89 02/24/2016 1158   Lab Results  Component Value Date   HGBA1C 5.3 12/11/2022   HGBA1C 5.3 03/22/2017   Lab Results  Component Value Date   INSULIN 7.8 12/11/2022   INSULIN 10.5 03/22/2017   Lab Results  Component Value Date   TSH 4.960 (H) 12/11/2022   CBC    Component Value Date/Time   WBC 6.4 08/04/2020 0000   WBC 6.6 07/05/2019 1629   RBC 4.26 08/04/2020 0000   HGB 13.5 08/04/2020 0000   HGB 12.6 05/07/2018 1057   HCT 41 08/04/2020 0000   HCT 38.8 05/07/2018 1057  PLT 262 08/04/2020 0000   MCV 95.0 07/05/2019 1629   MCV 90 05/07/2018 1057   MCH 30.0 07/05/2019 1629   MCHC 31.6 07/05/2019 1629   RDW 13.8 07/05/2019 1629   RDW 14.0 05/07/2018 1057   Iron Studies No results found for: "IRON", "TIBC", "FERRITIN", "IRONPCTSAT" Lipid Panel     Component Value Date/Time   CHOL 176 12/11/2022 1105   TRIG 117 12/11/2022 1105   HDL 54 12/11/2022 1105   CHOLHDL 3.3 10/11/2021 1142   CHOLHDL 3.9 12/14/2014 1036   VLDL 26 12/14/2014 1036   LDLCALC 101 (H) 12/11/2022 1105   Hepatic Function Panel     Component Value Date/Time   PROT 6.5  12/11/2022 1105   ALBUMIN 4.5 12/11/2022 1105   AST 19 12/11/2022 1105   ALT 20 12/11/2022 1105   ALKPHOS 81 12/11/2022 1105   BILITOT 0.3 12/11/2022 1105   BILIDIR 0.14 02/12/2020 1056      Component Value Date/Time   TSH 4.960 (H) 12/11/2022 1105   Nutritional Lab Results  Component Value Date   VD25OH 41.2 12/11/2022   VD25OH 43.3 05/07/2018   VD25OH 48.5 07/04/2017    ASSOCIATED CONDITIONS ADDRESSED TODAY  ASSESSMENT AND PLAN  Problem List Items Addressed This Visit     Vitamin D deficiency    Vitamin D Deficiency Vitamin D is not at goal of 50.  Most recent vitamin D level was 41.2. Endorses fatigue.  She is on  prescription ergocalciferol 50,000 IU weekly. No side effects , no N/V or muscle weakness with Ergocalciferol.  Lab Results  Component Value Date   VD25OH 41.2 12/11/2022   VD25OH 43.3 05/07/2018   VD25OH 48.5 07/04/2017   Plan: Continue and refill  prescription ergocalciferol 50,000 IU weekly Low vitamin D levels can be associated with adiposity and may result in leptin resistance and weight gain. Also associated with fatigue. Currently on vitamin D supplementation without any adverse effects.         Relevant Medications   Vitamin D, Ergocalciferol, (DRISDOL) 1.25 MG (50000 UNIT) CAPS capsule   Insulin resistance - Primary    Insulin Resistance Last fasting insulin was 7.8. A1c was 5.3. Polyphagia:No Medication(s): None She has been working on nutrition plan to decrease simple carbohydrates, increase lean proteins and exercise to promote weight loss, improve glycemic control and prevent progression to Type 2 diabetes.  Discussed role of insulin resistance in metabolism.  Discussed metformin for primary indication of insulin resistance. Discussed possible risks, benefits and side effects and she is agreeable to trial of metformin at this time.   Lab Results  Component Value Date   HGBA1C 5.3 12/11/2022   HGBA1C 5.2 06/26/2019   HGBA1C 5.0  02/07/2018   HGBA1C 5.2 07/04/2017   HGBA1C 5.3 03/22/2017   Lab Results  Component Value Date   INSULIN 7.8 12/11/2022   INSULIN 8.3 05/07/2018   INSULIN 11.8 07/04/2017   INSULIN 10.5 03/22/2017   Plan: Start  metformin 250 mg daily  for insulin resistance.  Continue working on nutrition plan to decrease simple carbohydrates, increase lean proteins and exercise to promote weight loss, improve glycemic control and prevent progression to Type 2 diabetes.  Increase protein intake. Increase fiber intake.        Relevant Medications   metFORMIN (GLUCOPHAGE) 500 MG tablet   Mixed hyperlipidemia    Hyperlipidemia LDL is not at goal. Medication(s): Crestor 10 mg daily (recently increased)  Has very strong family history of CAD, father deceased at  43/brother at 47 from CAD and sister with + calcium cardiac scoring.  Cardiovascular risk factors: advanced age (older than 78 for men, 27 for women), dyslipidemia, family history of premature cardiovascular disease, and obesity (BMI >= 30 kg/m2)        Lab Results  Component Value Date    CHOL 176 12/11/2022    HDL 54 12/11/2022    LDLCALC 101 (H) 12/11/2022    TRIG 117 12/11/2022    CHOLHDL 3.3 10/11/2021    CHOLHDL 3.1 02/12/2020    CHOLHDL 3.3 11/06/2019         Lab Results  Component Value Date    ALT 20 12/11/2022    AST 19 12/11/2022    ALKPHOS 81 12/11/2022    BILITOT 0.3 12/11/2022    The 10-year ASCVD risk score (Arnett DK, et al., 2019) is: 5.6%   Values used to calculate the score:     Age: 59 years     Sex: Female     Is Non-Hispanic African American: No     Diabetic: No     Tobacco smoker: No     Systolic Blood Pressure: 102 mmHg     Is BP treated: Yes     HDL Cholesterol: 54 mg/dL     Total Cholesterol: 176 mg/dL   Plan: Continue statin. Continue working on nutrition plan to promote weight loss and improve lipid profile/decrease CV risk.  Follow up lipid panel in 3-4 months.          Generalized  obesity   Relevant Medications   metFORMIN (GLUCOPHAGE) 500 MG tablet   BMI 30.0-30.9,adult   Low serum vitamin B12    B 12 level 269 12/11/2022. Endorses fatigue.   Plan: Continue nutrition plan which is B 12 rich diet, but also supplement with B 12 500 mcg SL once daily. Recheck B 12 level in next 3-4 months.       Hypothyroidism (Chronic)    On synthroid 62.5 mcg daily. Endorses fatigue.  TSH slightly elevated at 4.960 12/11/2022.  Plan: Continue Synthroid 62.5 mcg daily and repeat thyroid studies in 1-2 months. Sees PCP for management.        TREATMENT PLAN FOR OBESITY:  Recommended Dietary Goals  Beulah is currently in the action stage of change. As such, her goal is to continue weight management plan. She has agreed to the Category 1 Plan.  Behavioral Intervention  We discussed the following Behavioral Modification Strategies today: increasing lean protein intake, decreasing simple carbohydrates , increasing vegetables, avoiding skipping meals, increasing water intake, continue to practice mindfulness when eating, and planning for success.  Additional resources provided today: NA  Recommended Physical Activity Goals  Jenisse has been advised to work up to 150 minutes of moderate intensity aerobic activity a week and strengthening exercises 2-3 times per week for cardiovascular health, weight loss maintenance and preservation of muscle mass.   She has agreed to Continue current level of physical activity    Pharmacotherapy We discussed various medication options to help Harriett Sine with her weight loss efforts and we both agreed to start metformin 250 mg once daily and continue to work on nutritional and behavioral strategies to promote weight loss.  .    Return in about 2 weeks (around 01/24/2023).Marland Kitchen She was informed of the importance of frequent follow up visits to maximize her success with intensive lifestyle modifications for her multiple health conditions.   ATTESTASTION  STATEMENTS:  Reviewed by clinician on day of visit: allergies,  medications, problem list, medical history, surgical history, family history, social history, and previous encounter notes.   I have personally spent 44 minutes total time today in preparation, patient care, nutritional counseling and documentation for this visit, including the following: review of clinical lab tests; review of medical tests/procedures/services.      Adonus Uselman, PA-C

## 2023-01-10 NOTE — Therapy (Signed)
OUTPATIENT PHYSICAL THERAPY TREATMENT   Patient Name: Molly Giles MRN: [id number] DOB:Sep 29, 1955, 67 y.o., female Today's Date: 01/10/2023  END OF SESSION:  PT End of Session - 01/10/23 1653     Visit Number 3    Date for PT Re-Evaluation 02/26/23    Authorization Type Medicare A and B    Progress Note Due on Visit 10    PT Start Time 1615    PT Stop Time 1653    PT Time Calculation (min) 38 min    Activity Tolerance Patient tolerated treatment well    Behavior During Therapy [facility] for tasks assessed/performed              Past Medical History:  Diagnosis Date   Abdominal bloating 05/10/2021   Abdominal discomfort, generalized 05/07/2016   Acute gastritis 11/19/2020   Allergic rhinoconjunctivitis 07/02/2015   Anal fissure 11/19/2020   Anterior to posterior tear of superior glenoid labrum of left shoulder 02/22/2021   Arthritis    Benign positional vertigo 04/2015   Responded well to [facility]   Bruxism (teeth grinding)    Chronic contact dermatitis 08/06/2018   Per allergist Dr Edwyna Ready.    Chronic migraine 02/25/2016   Per Dr Lucia Gaskins review in notes from [facility] from September 2015. Showed total headache days last month 18. Severe headache days 7 days. Moderate headache days 5 days. Mild headache days last month sick days. Days without headache last month 10 days. Symptoms associated with photophobia, phonophobia, osmophobia, neck pain, dizziness, jaw pain, nasal congestion, vision disturbances, tingling and numbness, weakness and worsening with activity. Each headache attack last 3 hours depending on treatment in severity. Left side, the right side, easier side, the frontal area in the back of the head. Characterized as throbbing, pressure, tightness, squeezing, stabbing and burning    Chronic migraine without aura 05/15/2013    Dr Lucia Gaskins [facility]   Chronic tonsillitis 01/27/2019   DDD (degenerative disc disease),  cervical 11/18/2018   DDD (degenerative disc disease), lumbar    Depression    Disc displacement, lumbar    Dysphagia 10/11/2021   Dysrhythmia    seen by dr Donnie Aho- not a problem since she has been on Bystolic   Episodic cluster headache, not intractable 03/06/2017   Essential hypertension 05/15/2013   Family history of adverse reaction to anesthesia    Brother- N/V   Family history of premature CAD 05/15/2013   Fibromyalgia syndrome 07/01/2015   Management by Dr Kathie Rhodes. Devashwar (Rheum)    GERD (gastroesophageal reflux disease) 12/16/2014   H/O seasonal allergies    Hallux rigidus of right foot 09/25/2022   Hammer toe    Left great toe   Hashimoto's thyroiditis    Per patient, diagnosed by Dr. Ferdinand Cava   Hearing loss of both ears 07/27/2015   mild to borderline moderate low frequency hearing loss improving to within normal limits bilaterally on audiology testing at [facility] in November 2016.     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   Hyperlipidemia 1998   Hypokalemia due to excessive gastrointestinal loss of potassium 07/28/2019   Hypothyroidism    Impairment of balance 02/2015  Consequent of postconcussive syndrome   Injury of triangular fibrocartilage complex of left wrist 02/25/2019   Dx 02/25/19 Bradly Bienenstock IV MD ([facility])   Insulin resistance 07/04/2017   Internal hemorrhoid 01/28/2021   Internal hemorrhoid seen on colonoscopy 10/2020 Roderic Scarce MD [facility])   Interstitial cystitis    Irritable bowel syndrome with diarrhea 04/03/2016   Left ventricular hypertrophy, mild 02/25/2016   ECHOcardiogram report 06/17/15 showing EF55-60%, mild LVH and G1DD     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HCC) 03/10/2018   [facility]   Lumbar facet joint pain    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   Mood disorder (HCC)    Morbid obesity (HCC) 03/06/2017   Musculoskeletal neck pain 07/14/2015   Nocturnal hypoxemia 03/06/2017   Normal coronary arteries 05/14/2014   Obesity (BMI 30.0-34.9) 01/29/2017   Odynophagia 12/20/2021   Osteoarthritis of left hip 11/01/2015   MRI order by Dr Charlann Boxer (ortho) 10/2015 showed significant arthritis of left hip joint with cystic changes in femoral head c/w osteoarthritis   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, lumbar region 10/30/2011   Other insomnia 11/09/2016   Overweight    Pain in joint of left shoulder 11/09/2016   Pain in joint, multiple sites 11/18/2018   Palpitations 05/15/2013    Cardiology manages   Perianal candidiasis 12/20/2021   Periodic limb movement sleep disorder 03/28/2017   Perirectal cyst 05/07/2016   Poison ivy dermatitis 03/24/2021   PONV (postoperative nausea and vomiting)    Positive ANA (antinuclear antibody) 11/18/2018   Post concussion syndrome 06/06/2015   Post concussive syndrome 07/14/2015   Ms Macgowan's post-concussive syndrome manifesting in vertigo and headache, mood changes, poor balance, dizziness, and decreased concentration per Dr Lucia Gaskins at [facility].    Posterior vitreous detachment of right eye 2014   Premature menopause 12/20/2021   Rectal abscess 05/10/2021   S/P left THA, AA 07/25/2016   Sacroiliac inflammation (HCC) 08/18/2021   Shingles    Shortness of breath dyspnea    with exertion   Sicca syndrome (HCC) 11/18/2018   (+) ANA   Sleep walking and eating 03/06/2017   Snoring 03/06/2017   Spondylosis of lumbar region without myelopathy or radiculopathy 10/30/2011   TFC (triangular fibrocartilage complex) injury 02/25/2019   Thyroid nodule 08/11/2009   Findings: The thyroid gland is within normal limits in size.  The gland is  diffusely inhomogeneous. A small solid nodule is noted in the lower pole  medially on the right of 7 x 6 x 8 mm. A small solid nodule is noted inferiorly on the left of 3 x 3 x 4 mm.  IMPRESSION:  The thyroid gland is within normal limits in size with only small solid nodules present, the largest of only 8 mm in diameter on the right.     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    Trochanteric bursitis, right hip 04/26/2020   Tubular adenoma of colon 01/28/2021   Colonoscopy screening 4 mm tubular Adenoma polyp Charlott Rakes, MD [facility])   Tubular adenoma of colon 01/28/2021   Colonoscopy screening 4 mm tubular Adenoma polyp Charlott Rakes, MD [facility])   Vasomotor symptoms due to menopause 04/19/2017   Vitamin D deficiency 05/08/2017   Vulvitis 07/22/2020   Yeast vaginitis 07/11/2019   Past Surgical History:  Procedure Laterality Date   arthroscopy Left 01/2022   with SAD and DCR, K. Supple MD   Bladder dilitation  x 3   BREAST BIOPSY Right 2011   Benign histology   CARDIOVASCULAR STRESS TEST  2000   Unremarkable per pt report   CARPOMETACARPAL JOINT ARTHROTOMY Right 2011   COLONOSCOPY     COLONOSCOPY WITH PROPOFOL N/A 04/21/2015   Procedure: COLONOSCOPY WITH PROPOFOL;  Surgeon: Jeani Hawking, MD;  Location: [facility] ENDOSCOPY;  Service: Endoscopy;  Laterality: N/A;   CYSTOSCOPY W/ DILATION OF BLADDER N/A    EPIDURAL BLOCK INJECTION Left 04/12/2016   Left Medial Nerve Block and Left L5 ramus block, Dr Sheran Luz    EPIDURAL BLOCK INJECTION  03/21/2016   Left L3-4 medial branch block and Left L5 & dorsal ramus block    EPIDURAL BLOCK INJECTION N/A 10/25/2016   Sheran Luz, MD. Lumbar medial branch block   EPIDURAL BLOCK INJECTION N/A 02/09/2017   Sheran Luz, MD.  Bilateral L3/4 medial branch block, bilateral L5 dorsal ramus block   EPIDURAL BLOCK INJECTION N/A 07/04/2017   Sheran Luz, MD   FECAL  TRANSPLANT  04/21/2015   Procedure: FECAL TRANSPLANT;  Surgeon: Jeani Hawking, MD;  Location: [facility] ENDOSCOPY;  Service: Endoscopy;;   HIP ARTHROPLASTY Left    HIP ARTHROSCOPY Left 03/06/2018   Left hip arthroplasty, redo for loose hip arthroplasty. Procedure at [facility] hospital   INJECTION HIP INTRA ARTICULAR Left 11/2015   for OA by Dr Vilma Prader IMPLANT PLACEMENT  04/2021   Floyde Parkins MD ([facility])   INTERSTIM IMPLANT REVISION N/A 09/2021   Floyde Parkins MD ([facility])   OTHER SURGICAL HISTORY Left 2016   Left L3/L4 medial nerve block and Left L5 Dorsal Ramus block Dr Rosine Abe   TOTAL HIP ARTHROPLASTY Left 07/25/2016   Procedure: LEFT TOTAL HIP ARTHROPLASTY ANTERIOR APPROACH;  Surgeon: Durene Romans, MD;  Location: [facility];  Service: Orthopedics;  Laterality: Left;   Patient Active Problem List   Diagnosis Date Noted   Low serum vitamin B12 01/10/2023   OSA (obstructive sleep apnea) 12/27/2022   BMI 30.0-30.9,adult 12/27/2022   Severe obstructive sleep apnea-hypopnea syndrome 12/22/2022   Hyperglycemia 12/11/2022   Depression screening 12/11/2022   Generalized obesity 12/11/2022   SOBOE (shortness of breath on exertion) 12/11/2022   Other fatigue 12/11/2022   Sleep related headaches 11/07/2022   Intractable episodic cluster headache 11/07/2022   Sleep walking and eating 11/07/2022   Ambien use disorder, 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   Psychophysiological insomnia 11/07/2022   Rhinitis 11/03/2022   Hallux rigidus of right foot 09/25/2022   Morton neuroma, right 09/25/2022   Iliopsoas bursitis of left hip 05/08/2022   Chronic pain of right knee 05/08/2022   Arthrosis of hand 12/20/2021   History of Clostridioides difficile colitis, required fecal transplantation 12/20/2021   Mixed hyperlipidemia 12/20/2021   Meibomian gland dysfunction (MGD) of both eyes 10/17/2021   Nuclear sclerosis of both eyes 10/17/2021   Epiretinal membrane (ERM) of left eye  10/17/2021   OAB (overactive bladder) 03/02/2021   Osteoarthritis of acromioclavicular joint 02/22/2021   Hyperhidrosis, scalp, 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   Chronic low back pain 05/14/2019   Cervical spondylosis 11/18/2018   DDD (degenerative disc disease), lumbar 11/18/2018   Keratoconjunctivitis sicca (HCC) 11/18/2018   Chronic contact dermatitis 08/06/2018   Primary osteoarthritis of left knee 06/20/2018   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HCC) 11/29/2017   Insulin resistance 07/04/2017   Vitamin D deficiency 05/08/2017   Periodic limb movement sleep disorder 03/28/2017   Obesity (BMI 30.0-34.9) 01/29/2017   Other insomnia  11/09/2016   Irritable bowel syndrome with diarrhea 04/03/2016   Left ventricular hypertrophy, mild 02/25/2016   Mood disorder (HCC) 07/02/2015   Allergic rhinoconjunctivitis 07/02/2015   Fibromyalgia syndrome 07/01/2015   Personal history of colonic polyps 07/01/2015   GERD (gastroesophageal reflux disease) 12/16/2014   Hypothyroidism 05/15/2013   Pure hypercholesterolemia 05/15/2013   Chronic interstitial cystitis 05/15/2013   Chronic migraine without aura 05/15/2013    PCP: McDiarmid, Leighton Roach, MD   REFERRING PROVIDER: Molly Maduro, MD   REFERRING DIAG: M25.551 Pain in right hip  THERAPY DIAG:  Pain in right hip  Stiffness of right hip, not elsewhere classified  Muscle weakness (generalized)  Difficulty in walking, not elsewhere classified  Cramp and spasm  Rationale for Evaluation and Treatment: Rehabilitation  ONSET DATE: 12/11/22  SUBJECTIVE:   SUBJECTIVE STATEMENT: No longer using the cane.  I am feeling better overall.    PERTINENT HISTORY: Left hip THA PAIN:  Are you having pain?  0-4/10 Rt lateral hip, aggravated when hip is tired at the end of the day Pain is better at the start of the day.    PRECAUTIONS: None  WEIGHT BEARING RESTRICTIONS: No  FALLS:  Has patient fallen in last 6 months? No  LIVING ENVIRONMENT: Lives with:  lives with their spouse Lives in: House/apartment   OCCUPATION: Retired, she has sleep apnea and tends to sleep in, gets up to do housework, goes out with friends.  She was walking her dog but unable currently due to the pain.   PLOF: Independent, Independent with basic ADLs, Independent with household mobility without device, Independent with community mobility without device, Independent with homemaking with ambulation, Independent with gait, and Independent with transfers  PATIENT GOALS: To get out of pain and be able to walk and be more active  NEXT MD VISIT: prn  OBJECTIVE:   DIAGNOSTIC FINDINGS:  Impression: Ultrasound Undersurface fraying and probable low-grade partial tearing of the  distal gluteus minimus tendon at the greater trochanter with  moderate subgluteus bursitis.  PATIENT SURVEYS:  LEFS: complete next visit  COGNITION: Overall cognitive status: Within functional limits for tasks assessed     SENSATION: WFL   MUSCLE LENGTH: Hamstrings: Right 50 deg; Left 60 deg Thomas test: Right Positive; Left Positive  POSTURE: No Significant postural limitations  PALPATION: Tender over lateral right hip at greater trochanter  LOWER EXTREMITY ROM:  WFL  LOWER EXTREMITY MMT:  MMT Right eval Left eval  Hip flexion 4+ 4+  Hip extension 3+ 4  Hip abduction 3+ 3+  Hip adduction 5 5  Hip internal rotation 4- 4-  Hip external rotation 4- 4-   LOWER EXTREMITY SPECIAL TESTS:  Hip special tests: Luisa Hart (FABER) test: positive , Trendelenburg test: positive , Thomas test: positive , and Ober's test: positive   FUNCTIONAL TESTS:  5 times sit to stand: 11.67 sec Timed up and go (TUG): 8.57 sec  GAIT: Distance walked: 30 Assistive device utilized:  oc and None Level of assistance: Complete Independence Comments: antalgic/trendelenburg   TODAY'S TREATMENT:        DATE: 01/10/23 Nustep x 6 min level 5-PT present to discuss progress  Seated hamstring stretch  3x20 seconds  Sidelying clam and reverse clam x10 each Seated LAQ x 20 with 2 lbs Standing march 2# 2x10 Seated Hip ER with 2 lbs x 20 Sit to stand 2x10- pad in seat Sidyling abduction: tactile cues for alignment and reduced height to avoid hip flexor activation Standing hip extension and abduction:  x10 bil with UE support                                                                                                                       DATE: 01/03/2023 Nustep x 5 min level 5 Seated clam with blue loop x 20 Seated LAQ x 20 with 2 lbs Seated march x 20 with 2 lbs Seated Hip ER with 2 lbs x 20 Sit to stand  Hip matrix hip extension and abduction  with 25 lbs 4 sets of 5 (both) Manual: rolling and theragun to right IT band x 8-9 min  DATE: 01/01/2023 Initial eval completed and initiated HEP    PATIENT EDUCATION:  Education details: Initiated HEP and educated on leg length discrepency vs muscle imbalance Person educated: Patient Education method: Programmer, multimedia, Facilities manager, Verbal cues, and Handouts Education comprehension: verbalized understanding, returned demonstration, and verbal cues required  HOME EXERCISE PROGRAM: Access Code: [email or web address] URL: [email or web address] Date: 01/01/2023 Prepared by: Mikey Kirschner  Exercises - Sidelying Hip Abduction  - 1 x daily - 7 x weekly - 2 sets - 10 reps - Clam with Resistance  - 1 x daily - 7 x weekly - 2 sets - 10 reps - Sidelying Reverse Clamshell with Resistance  - 1 x daily - 7 x weekly - 2 sets - 10 reps - Prone Hip Extension  - 1 x daily - 7 x weekly - 2 sets - 10 reps - Standing Hamstring Stretch on Chair  - 1 x daily - 7 x weekly - 1 sets - 3 reps - 30 sec hold  ASSESSMENT:  CLINICAL IMPRESSION: Pt reports that she is feeling much better overall.  She has not had to use her cane since she started PT.  Pt with Rt gluteal weakness and she is working to strengthen for symmetry.  PT provided verbal and tactile cues  for alignment and symmetry.   She would benefit from skilled PT to improve hip stability and core strengthening, postural training and gait training to restore symmetrical gait and eliminate trendelenburg.    OBJECTIVE IMPAIRMENTS: Abnormal gait, difficulty walking, decreased ROM, decreased strength, increased fascial restrictions, increased muscle spasms, impaired flexibility, and pain.   ACTIVITY LIMITATIONS: bending, sitting, squatting, stairs, transfers, bed mobility, and dressing  PARTICIPATION LIMITATIONS: meal prep, cleaning, laundry, driving, shopping, community activity, and yard work  PERSONAL FACTORS: Age, Fitness, and Past/current experiences are also affecting patient's functional outcome.   REHAB POTENTIAL: Good  CLINICAL DECISION MAKING: Stable/uncomplicated  EVALUATION COMPLEXITY: Low   GOALS: Goals reviewed with patient? Yes  SHORT TERM GOALS: Target date: 01/29/2023  Pain report to be no greater than 4/10  Baseline: Goal status: INITIAL  2.  Patient will be independent with initial HEP  Baseline:  Goal status: INITIAL   LONG TERM GOALS: Target date: 02/26/2023  Patient to report pain no greater than 2/10  Baseline:  Goal status: INITIAL  2.  Patient to be independent with advanced HEP  Baseline:  Goal status:  INITIAL  3.  Patient to be able to sleep through the night  Baseline:  Goal status: INITIAL  4.  Patient to be able to stand or walk for at least 15 min without right hip pain Baseline:  Goal status: INITIAL  5.  Functional scores to improve by 1-3 seconds Baseline:  Goal status: INITIAL  6.  Patient to report 85% improvement in overall symptoms  Baseline:  Goal status: INITIAL   PLAN:  PT FREQUENCY: 1-2x/week  PT DURATION: 8 weeks  PLANNED INTERVENTIONS: Therapeutic exercises, Therapeutic activity, Neuromuscular re-education, Balance training, Gait training, Patient/Family education, Self Care, Joint mobilization, Stair training,  DME instructions, Aquatic Therapy, Dry Needling, Electrical stimulation, Spinal mobilization, Cryotherapy, Moist heat, Taping, Traction, Ultrasound, Ionotophoresis 4mg /ml Dexamethasone, Manual therapy, and Re-evaluation  PLAN FOR NEXT SESSION: Nustep, review new HEP, focus on bilateral hip stability training and core strength.   Lorrene Reid, PT 01/10/23 4:57 PM   [facility] [address] [place], [place] [address] Phone # [phone] Fax [phone]

## 2023-01-10 NOTE — Assessment & Plan Note (Signed)
On synthroid 62.5 mcg daily. Endorses fatigue.  TSH slightly elevated at 4.960 12/11/2022.  Plan: Continue Synthroid 62.5 mcg daily and repeat thyroid studies in 1-2 months. Sees PCP for management.

## 2023-01-11 ENCOUNTER — Ambulatory Visit (INDEPENDENT_AMBULATORY_CARE_PROVIDER_SITE_OTHER): Payer: Medicare Other | Admitting: Physician Assistant

## 2023-01-16 ENCOUNTER — Ambulatory Visit: Payer: Medicare Other | Admitting: Physical Therapy

## 2023-01-16 DIAGNOSIS — M6281 Muscle weakness (generalized): Secondary | ICD-10-CM

## 2023-01-16 DIAGNOSIS — M25551 Pain in right hip: Secondary | ICD-10-CM | POA: Diagnosis not present

## 2023-01-16 DIAGNOSIS — M25651 Stiffness of right hip, not elsewhere classified: Secondary | ICD-10-CM

## 2023-01-16 NOTE — Therapy (Signed)
OUTPATIENT PHYSICAL THERAPY TREATMENT   Patient Name: Molly Giles MRN: [id number] DOB:19-Jul-1956, 67 y.o., female Today's Date: 01/16/2023  END OF SESSION:  PT End of Session - 01/16/23 1528     Visit Number 4    Date for PT Re-Evaluation 02/26/23    Authorization Type Medicare A and B    Progress Note Due on Visit 10    PT Start Time 1530    PT Stop Time 1612    PT Time Calculation (min) 42 min    Activity Tolerance Patient tolerated treatment well              Past Medical History:  Diagnosis Date   Abdominal bloating 05/10/2021   Abdominal discomfort, generalized 05/07/2016   Acute gastritis 11/19/2020   Allergic rhinoconjunctivitis 07/02/2015   Anal fissure 11/19/2020   Anterior to posterior tear of superior glenoid labrum of left shoulder 02/22/2021   Arthritis    Benign positional vertigo 04/2015   Responded well to [facility]   Bruxism (teeth grinding)    Chronic contact dermatitis 08/06/2018   Per allergist Dr Edwyna Ready.    Chronic migraine 02/25/2016   Per Dr Lucia Gaskins review in notes from [facility] from September 2015. Showed total headache days last month 18. Severe headache days 7 days. Moderate headache days 5 days. Mild headache days last month sick days. Days without headache last month 10 days. Symptoms associated with photophobia, phonophobia, osmophobia, neck pain, dizziness, jaw pain, nasal congestion, vision disturbances, tingling and numbness, weakness and worsening with activity. Each headache attack last 3 hours depending on treatment in severity. Left side, the right side, easier side, the frontal area in the back of the head. Characterized as throbbing, pressure, tightness, squeezing, stabbing and burning    Chronic migraine without aura 05/15/2013    Dr Lucia Gaskins [facility]   Chronic tonsillitis 01/27/2019   DDD (degenerative disc disease), cervical 11/18/2018   DDD (degenerative disc disease),  lumbar    Depression    Disc displacement, lumbar    Dysphagia 10/11/2021   Dysrhythmia    seen by dr Donnie Aho- not a problem since she has been on Bystolic   Episodic cluster headache, not intractable 03/06/2017   Essential hypertension 05/15/2013   Family history of adverse reaction to anesthesia    Brother- N/V   Family history of premature CAD 05/15/2013   Fibromyalgia syndrome 07/01/2015   Management by Dr Kathie Rhodes. Devashwar (Rheum)    GERD (gastroesophageal reflux disease) 12/16/2014   H/O seasonal allergies    Hallux rigidus of right foot 09/25/2022   Hammer toe    Left great toe   Hashimoto's thyroiditis    Per patient, diagnosed by Dr. Ferdinand Cava   Hearing loss of both ears 07/27/2015   mild to borderline moderate low frequency hearing loss improving to within normal limits bilaterally on audiology testing at [facility] in November 2016.     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   Hyperlipidemia 1998   Hypokalemia due to excessive gastrointestinal loss of potassium 07/28/2019   Hypothyroidism    Impairment of balance 02/2015   Consequent of postconcussive syndrome   Injury of triangular fibrocartilage  complex of left wrist 02/25/2019   Dx 02/25/19 Bradly Bienenstock IV MD ([facility])   Insulin resistance 07/04/2017   Internal hemorrhoid 01/28/2021   Internal hemorrhoid seen on colonoscopy 10/2020 Roderic Scarce MD [facility])   Interstitial cystitis    Irritable bowel syndrome with diarrhea 04/03/2016   Left ventricular hypertrophy, mild 02/25/2016   ECHOcardiogram report 06/17/15 showing EF55-60%, mild LVH and G1DD    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HCC) 03/10/2018    [facility]   Lumbar facet joint pain    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   Mood disorder (HCC)    Morbid obesity (HCC) 03/06/2017   Musculoskeletal neck pain 07/14/2015   Nocturnal hypoxemia 03/06/2017   Normal coronary arteries 05/14/2014   Obesity (BMI 30.0-34.9) 01/29/2017   Odynophagia 12/20/2021   Osteoarthritis of left hip 11/01/2015   MRI order by Dr Charlann Boxer (ortho) 10/2015 showed significant arthritis of left hip joint with cystic changes in femoral head c/w osteoarthritis   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, lumbar region 10/30/2011   Other insomnia 11/09/2016   Overweight    Pain in joint of left shoulder 11/09/2016   Pain in joint, multiple sites 11/18/2018   Palpitations 05/15/2013   [facility] manages   Perianal candidiasis 12/20/2021   Periodic limb movement sleep disorder 03/28/2017   Perirectal cyst 05/07/2016   Poison ivy dermatitis 03/24/2021   PONV (postoperative nausea and vomiting)    Positive ANA (antinuclear antibody) 11/18/2018   Post concussion syndrome 06/06/2015   Post concussive syndrome 07/14/2015   Ms Peeters's post-concussive syndrome manifesting in vertigo and headache, mood changes, poor balance, dizziness, and decreased concentration per Dr Lucia Gaskins at [facility].    Posterior vitreous detachment of right eye 2014   Premature menopause 12/20/2021   Rectal abscess 05/10/2021   S/P left THA, AA 07/25/2016   Sacroiliac inflammation (HCC) 08/18/2021   Shingles    Shortness of breath dyspnea    with exertion   Sicca syndrome (HCC) 11/18/2018   (+) ANA   Sleep walking and eating 03/06/2017   Snoring 03/06/2017   Spondylosis of lumbar region without myelopathy or radiculopathy 10/30/2011   TFC (triangular fibrocartilage complex) injury 02/25/2019   Thyroid nodule 08/11/2009   Findings: The thyroid gland is within normal limits in size.  The gland is diffusely inhomogeneous. A small solid nodule is noted  in the lower pole  medially on the right of 7 x 6 x 8 mm. A small solid nodule is noted inferiorly on the left of 3 x 3 x 4 mm.  IMPRESSION:  The thyroid gland is within normal limits in size with only small solid nodules present, the largest of only 8 mm in diameter on the right.     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    Trochanteric bursitis, right hip 04/26/2020   Tubular adenoma of colon 01/28/2021   Colonoscopy screening 4 mm tubular Adenoma polyp Charlott Rakes, MD [facility])   Tubular adenoma of colon 01/28/2021   Colonoscopy screening 4 mm tubular Adenoma polyp Charlott Rakes, MD [facility])   Vasomotor symptoms due to menopause 04/19/2017   Vitamin D deficiency 05/08/2017   Vulvitis 07/22/2020   Yeast vaginitis 07/11/2019   Past Surgical History:  Procedure Laterality Date   arthroscopy Left 01/2022   with SAD and DCR, K. Supple MD   Bladder dilitation     x 3   BREAST BIOPSY  Right 2011   Benign histology   CARDIOVASCULAR STRESS TEST  2000   Unremarkable per pt report   CARPOMETACARPAL JOINT ARTHROTOMY Right 2011   COLONOSCOPY     COLONOSCOPY WITH PROPOFOL N/A 04/21/2015   Procedure: COLONOSCOPY WITH PROPOFOL;  Surgeon: Jeani Hawking, MD;  Location: [facility] ENDOSCOPY;  Service: Endoscopy;  Laterality: N/A;   CYSTOSCOPY W/ DILATION OF BLADDER N/A    EPIDURAL BLOCK INJECTION Left 04/12/2016   Left Medial Nerve Block and Left L5 ramus block, Dr Sheran Luz    EPIDURAL BLOCK INJECTION  03/21/2016   Left L3-4 medial branch block and Left L5 & dorsal ramus block    EPIDURAL BLOCK INJECTION N/A 10/25/2016   Sheran Luz, MD. Lumbar medial branch block   EPIDURAL BLOCK INJECTION N/A 02/09/2017   Sheran Luz, MD.  Bilateral L3/4 medial branch block, bilateral L5 dorsal ramus block   EPIDURAL BLOCK INJECTION N/A 07/04/2017   Sheran Luz, MD   FECAL TRANSPLANT  04/21/2015   Procedure: FECAL TRANSPLANT;   Surgeon: Jeani Hawking, MD;  Location[facility] END[facility]COPY;  Service: Endoscopy;;   HIP ARTHROPLASTY Left    HIP ARTHROSCOPY Left 03/06/2018   Left hip arthroplasty, redo for loose hip arthroplasty. Procedure at [facility] hospital   INJECTION HIP INTRA ARTICULAR Left 11/2015   for OA by Dr Vilma Prader IMPLANT PLACEMENT  04/2021   Floyde Parkins MD ([facility])   INTERSTIM IMPLANT REVISION N/A 09/2021   Floyde Parkins MD ([facility])   OTHER SURGICAL HISTORY Left 2016   Left L3/L4 medial nerve block and Left L5 Dorsal Ramus block Dr Rosine Abe   TOTAL HIP ARTHROPLASTY Left 07/25/2016   Procedure: LEFT TOTAL HIP ARTHROPLASTY ANTERIOR APPROACH;  Surgeon: Durene Romans, MD;  Location: [facility] ORS;  Service: Orthopedics;  Laterality: Left;   Patient Active Problem List   Diagnosis Date Noted   Low serum vitamin B12 01/10/2023   OSA (obstructive sleep apnea) 12/27/2022   BMI 30.0-30.9,adult 12/27/2022   Severe obstructive sleep apnea-hypopnea syndrome 12/22/2022   Hyperglycemia 12/11/2022   Depression screening 12/11/2022   Generalized obesity 12/11/2022   SOBOE (shortness of breath on exertion) 12/11/2022   Other fatigue 12/11/2022   Sleep related headaches 11/07/2022   Intractable episodic cluster headache 11/07/2022   Sleep walking and eating 11/07/2022   Ambien use disorder, 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   Psychophysiological insomnia 11/07/2022   Rhinitis 11/03/2022   Hallux rigidus of right foot 09/25/2022   Morton neuroma, right 09/25/2022   Iliopsoas bursitis of left hip 05/08/2022   Chronic pain of right knee 05/08/2022   Arthrosis of hand 12/20/2021   History of Clostridioides difficile colitis, required fecal transplantation 12/20/2021   Mixed hyperlipidemia 12/20/2021   Meibomian gland dysfunction (MGD) of both eyes 10/17/2021   Nuclear sclerosis of both eyes 10/17/2021   Epiretinal membrane (ERM) of left eye 10/17/2021   OAB (overactive bladder) 03/02/2021    Osteoarthritis of acromioclavicular joint 02/22/2021   Hyperhidrosis, scalp, 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   Chronic low back pain 05/14/2019   Cervical spondylosis 11/18/2018   DDD (degenerative disc disease), lumbar 11/18/2018   Keratoconjunctivitis sicca (HCC) 11/18/2018   Chronic contact dermatitis 08/06/2018   Primary osteoarthritis of left knee 06/20/2018   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HCC) 11/29/2017   Insulin resistance 07/04/2017   Vitamin D deficiency 05/08/2017   Periodic limb movement sleep disorder 03/28/2017   Obesity (BMI 30.0-34.9) 01/29/2017   Other insomnia 11/09/2016   Irritable bowel syndrome  with diarrhea 04/03/2016   Left ventricular hypertrophy, mild 02/25/2016   Mood disorder (HCC) 07/02/2015   Allergic rhinoconjunctivitis 07/02/2015   Fibromyalgia syndrome 07/01/2015   Personal history of colonic polyps 07/01/2015   GERD (gastroesophageal reflux disease) 12/16/2014   Hypothyroidism 05/15/2013   Pure hypercholesterolemia 05/15/2013   Chronic interstitial cystitis 05/15/2013   Chronic migraine without aura 05/15/2013    PCP: McDiarmid, Leighton Roach, MD   REFERRING PROVIDER: Molly Maduro, MD   REFERRING DIAG: M25.551 Pain in right hip  THERAPY DIAG:  Pain in right hip  Stiffness of right hip, not elsewhere classified  Muscle weakness (generalized)  Rationale for Evaluation and Treatment: Rehabilitation  ONSET DATE: 12/11/22  SUBJECTIVE:   SUBJECTIVE STATEMENT: I have Morton's neuroma on right foot, I've had 2 injections but I think that's affected my progress.  Going to see the doctor again about it.  Hip pain depends on how much I'm on it.  Reports good understanding of her ex's lying down.  The only one she's having trouble with is standing hip abduction.    PERTINENT HISTORY: Left hip THA PAIN:  Are you having pain?  4/10 Rt posterior- lateral hip, aggravated when hip is tired at the end of the day Pain is better at the start of the day.     PRECAUTIONS: None  WEIGHT BEARING RESTRICTIONS: No  FALLS:  Has patient fallen in last 6 months? No  LIVING ENVIRONMENT: Lives with: lives with their spouse Lives in: House/apartment   OCCUPATION: Retired, she has sleep apnea and tends to sleep in, gets up to do housework, goes out with friends.  She was walking her dog but unable currently due to the pain.   PLOF: Independent, Independent with basic ADLs, Independent with household mobility without device, Independent with community mobility without device, Independent with homemaking with ambulation, Independent with gait, and Independent with transfers  PATIENT GOALS: To get out of pain and be able to walk and be more active  NEXT MD VISIT: prn  OBJECTIVE:   DIAGNOSTIC FINDINGS:  Impression: Ultrasound Undersurface fraying and probable low-grade partial tearing of the  distal gluteus minimus tendon at the greater trochanter with  moderate subgluteus bursitis.  PATIENT SURVEYS:  LEFS: complete next visit  COGNITION: Overall cognitive status: Within functional limits for tasks assessed     SENSATION: WFL   MUSCLE LENGTH: Hamstrings: Right 50 deg; Left 60 deg Thomas test: Right Positive; Left Positive  POSTURE: No Significant postural limitations  PALPATION: Tender over lateral right hip at greater trochanter  LOWER EXTREMITY ROM:  WFL  LOWER EXTREMITY MMT:  MMT Right eval Left eval  Hip flexion 4+ 4+  Hip extension 3+ 4  Hip abduction 3+ 3+  Hip adduction 5 5  Hip internal rotation 4- 4-  Hip external rotation 4- 4-   LOWER EXTREMITY SPECIAL TESTS:  Hip special tests: Luisa Hart (FABER) test: positive , Trendelenburg test: positive , Thomas test: positive , and Ober's test: positive   FUNCTIONAL TESTS:  5 times sit to stand: 11.67 sec Timed up and go (TUG): 8.57 sec  GAIT: Distance walked: 30 Assistive device utilized:  oc and None Level of assistance: Complete Independence Comments:  antalgic/trendelenburg   TODAY'S TREATMENT:      DATE: 01/16/23 Nustep x 6 min level 3-PT present to discuss progress  Patient having difficulty with standing hip abduction exercise at home (demonstrates major lateral trunk lean compensatory strategy) Standing in front of the mirror with light touch on chair back  in staggered stance position: cue to stand tall to activate glute medius 3x 5 Standing in front of the mirror with light touch on chair back in staggered stance position: with lateral toe touch 2x5 Dead lifts 5# kettlebell to touch tall cone 2 sets of 5 Squats to 20 inch box holding 5# kettlebell 2 sets of 5      DATE: 01/10/23 Nustep x 6 min level 5-PT present to discuss progress  Seated hamstring stretch 3x20 seconds  Sidelying clam and reverse clam x10 each Seated LAQ x 20 with 2 lbs Standing march 2# 2x10 Seated Hip ER with 2 lbs x 20 Sit to stand 2x10- pad in seat Sidyling abduction: tactile cues for alignment and reduced height to avoid hip flexor activation Standing hip extension and abduction: x10 bil with UE support                                                                                                                       DATE: 01/03/2023 Nustep x 5 min level 5 Seated clam with blue loop x 20 Seated LAQ x 20 with 2 lbs Seated march x 20 with 2 lbs Seated Hip ER with 2 lbs x 20 Sit to stand  Hip matrix hip extension and abduction  with 25 lbs 4 sets of 5 (both) Manual: rolling and theragun to right IT band x 8-9 min  DATE: 01/01/2023 Initial eval completed and initiated HEP    PATIENT EDUCATION:  Education details: Initiated HEP and educated on leg length discrepency vs muscle imbalance Person educated: Patient Education method: Programmer, multimedia, Facilities manager, Verbal cues, and Handouts Education comprehension: verbalized understanding, returned demonstration, and verbal cues required  HOME EXERCISE PROGRAM: Access Code: [email or web address] URL:  [email or web address] Date: 01/16/2023 Prepared by: Lavinia Sharps  Exercises - Sidelying Hip Abduction  - 1 x daily - 7 x weekly - 2 sets - 10 reps - Clam with Resistance  - 1 x daily - 7 x weekly - 2 sets - 10 reps - Sidelying Reverse Clamshell with Resistance  - 1 x daily - 7 x weekly - 2 sets - 10 reps - Prone Hip Extension  - 1 x daily - 7 x weekly - 2 sets - 10 reps - Standing Hamstring Stretch on Chair  - 1 x daily - 7 x weekly - 1 sets - 3 reps - 30 sec hold - Standing Hip Extension with Counter Support  - 2 x daily - 7 x weekly - 2 sets - 10 reps - Standing Hip Abduction with Anterior Support  - 2 x daily - 7 x weekly - 2 sets - 10 reps - Sit to Stand Without Arm Support  - 2 x daily - 7 x weekly - 2 sets - 10 reps - Staggered Stance Step Throughs (Mirrored)  - 1 x daily - 7 x weekly - 2 sets - 5 reps - Squat with Chair Touch  - 1 x daily - 7 x weekly - 2 sets -  5 reps - Half Deadlift with Kettlebell  - 1 x daily - 7 x weekly - 2 sets - 5 reps   ASSESSMENT:  CLINICAL IMPRESSION: Patient demonstrates lateral trunk lean compensation with standing hip abduction.  Able to do standing lateral toe touch without compensatory strategies.  Verbal cues for proper recruitment of glute medius muscle activation needed for standing and walking longer periods of time without a pelvic drop.  Therapist progressing and updating HEP for increased intensity and challenge level for further strengthening and functional mobility.   She does have and increase in foot pain at the end of session which may affect standing hip progression of ex's.  OBJECTIVE IMPAIRMENTS: Abnormal gait, difficulty walking, decreased ROM, decreased strength, increased fascial restrictions, increased muscle spasms, impaired flexibility, and pain.   ACTIVITY LIMITATIONS: bending, sitting, squatting, stairs, transfers, bed mobility, and dressing  PARTICIPATION LIMITATIONS: meal prep, cleaning, laundry, driving,  shopping, community activity, and yard work  PERSONAL FACTORS: Age, Fitness, and Past/current experiences are also affecting patient's functional outcome.   REHAB POTENTIAL: Good  CLINICAL DECISION MAKING: Stable/uncomplicated  EVALUATION COMPLEXITY: Low   GOALS: Goals reviewed with patient? Yes  SHORT TERM GOALS: Target date: 01/29/2023  Pain report to be no greater than 4/10  Baseline: Goal status: INITIAL  2.  Patient will be independent with initial HEP  Baseline:  Goal status: INITIAL   LONG TERM GOALS: Target date: 02/26/2023  Patient to report pain no greater than 2/10  Baseline:  Goal status: INITIAL  2.  Patient to be independent with advanced HEP  Baseline:  Goal status: INITIAL  3.  Patient to be able to sleep through the night  Baseline:  Goal status: INITIAL  4.  Patient to be able to stand or walk for at least 15 min without right hip pain Baseline:  Goal status: INITIAL  5.  Functional scores to improve by 1-3 seconds Baseline:  Goal status: INITIAL  6.  Patient to report 85% improvement in overall symptoms  Baseline:  Goal status: INITIAL   PLAN:  PT FREQUENCY: 1-2x/week  PT DURATION: 8 weeks  PLANNED INTERVENTIONS: Therapeutic exercises, Therapeutic activity, Neuromuscular re-education, Balance training, Gait training, Patient/Family education, Self Care, Joint mobilization, Stair training, DME instructions, Aquatic Therapy, Dry Needling, Electrical stimulation, Spinal mobilization, Cryotherapy, Moist heat, Taping, Traction, Ultrasound, Ionotophoresis 4mg /ml Dexamethasone, Manual therapy, and Re-evaluation  PLAN FOR NEXT SESSION: ;  Nustep, review new HEP, focus on bilateral hip stability training and core strength.  Mentioned DN but defers at this time (had in the past)  Lavinia Sharps, PT 01/16/23 8:58 PM Phone: [phone] Fax: [phone]   [facility] [address] [place], [place]  [address] Phone # [phone] Fax [phone]

## 2023-01-17 ENCOUNTER — Ambulatory Visit: Payer: Medicare Other | Admitting: Physical Therapy

## 2023-01-17 DIAGNOSIS — M2040 Other hammer toe(s) (acquired), unspecified foot: Secondary | ICD-10-CM | POA: Insufficient documentation

## 2023-01-22 ENCOUNTER — Telehealth: Payer: Self-pay | Admitting: Neurology

## 2023-01-22 NOTE — Telephone Encounter (Signed)
Contacted pt back, she stated she has migraines and can't tolerate the plastic odor from the mask. She was set up Thursday 5/9 and she tried it Friday for 2hrs and 23 mins per report. She also mentioned the mask being severely uncomfortable and tight. She did attempt to soak it in water with dish detergent the next day, all day, which did not help with odor. She contacted DME and informed them of this and no one gave her other recommendations, just simply said if she can't tolerate  it return the machine and sign papers.   I informed her as far as the fit goes, this would be something she would need to discuss with DME for mask refit. Informed her I am not sure what to do regarding the odor but I can reach out to DME to advise what to do regarding smell and let her know. She verbally understood and was appreciative.  We are reaching out to Production designer, theatre/television/film.

## 2023-01-22 NOTE — Telephone Encounter (Signed)
Pt states she is unable to tolerate her CPAP and will be taking it back. She would like a call to discuss this.

## 2023-01-23 ENCOUNTER — Ambulatory Visit: Payer: Medicare Other

## 2023-01-23 DIAGNOSIS — R252 Cramp and spasm: Secondary | ICD-10-CM

## 2023-01-23 DIAGNOSIS — M25651 Stiffness of right hip, not elsewhere classified: Secondary | ICD-10-CM

## 2023-01-23 DIAGNOSIS — M6281 Muscle weakness (generalized): Secondary | ICD-10-CM

## 2023-01-23 DIAGNOSIS — M25551 Pain in right hip: Secondary | ICD-10-CM

## 2023-01-23 DIAGNOSIS — R262 Difficulty in walking, not elsewhere classified: Secondary | ICD-10-CM

## 2023-01-23 NOTE — Therapy (Signed)
OUTPATIENT PHYSICAL THERAPY TREATMENT   Patient Name: Molly Giles MRN: [id number] DOB:Jun 12, 1956, 67 y.o., female Today's Date: 01/24/2023  END OF SESSION:  PT End of Session - 01/23/23 1534     Visit Number 5    Date for PT Re-Evaluation 02/26/23    Authorization Type Medicare A and B    Progress Note Due on Visit 10    PT Start Time 1535    PT Stop Time 1615    PT Time Calculation (min) 40 min    Activity Tolerance Patient tolerated treatment well    Behavior During Therapy [facility] for tasks assessed/performed              Past Medical History:  Diagnosis Date   Abdominal bloating 05/10/2021   Abdominal discomfort, generalized 05/07/2016   Acute gastritis 11/19/2020   Allergic rhinoconjunctivitis 07/02/2015   Anal fissure 11/19/2020   Anterior to posterior tear of superior glenoid labrum of left shoulder 02/22/2021   Arthritis    Benign positional vertigo 04/2015   Responded well to [facility]   Bruxism (teeth grinding)    Chronic contact dermatitis 08/06/2018   Per allergist Dr Edwyna Ready.    Chronic migraine 02/25/2016   Per Dr Lucia Gaskins review in notes from [facility] from September 2015. Showed total headache days last month 18. Severe headache days 7 days. Moderate headache days 5 days. Mild headache days last month sick days. Days without headache last month 10 days. Symptoms associated with photophobia, phonophobia, osmophobia, neck pain, dizziness, jaw pain, nasal congestion, vision disturbances, tingling and numbness, weakness and worsening with activity. Each headache attack last 3 hours depending on treatment in severity. Left side, the right side, easier side, the frontal area in the back of the head. Characterized as throbbing, pressure, tightness, squeezing, stabbing and burning    Chronic migraine without aura 05/15/2013    Dr Lucia Gaskins [facility]   Chronic tonsillitis 01/27/2019   DDD (degenerative disc disease),  cervical 11/18/2018   DDD (degenerative disc disease), lumbar    Depression    Disc displacement, lumbar    Dysphagia 10/11/2021   Dysrhythmia    seen by dr Donnie Aho- not a problem since she has been on Bystolic   Episodic cluster headache, not intractable 03/06/2017   Essential hypertension 05/15/2013   Family history of adverse reaction to anesthesia    Brother- N/V   Family history of premature CAD 05/15/2013   Fibromyalgia syndrome 07/01/2015   Management by Dr Kathie Rhodes. Devashwar (Rheum)    GERD (gastroesophageal reflux disease) 12/16/2014   H/O seasonal allergies    Hallux rigidus of right foot 09/25/2022   Hammer toe    Left great toe   Hashimoto's thyroiditis    Per patient, diagnosed by Dr. Ferdinand Cava   Hearing loss of both ears 07/27/2015   mild to borderline moderate low frequency hearing loss improving to within normal limits bilaterally on audiology testing at [facility] in November 2016.     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   Hyperlipidemia 1998   Hypokalemia due to excessive gastrointestinal loss of potassium 07/28/2019   Hypothyroidism    Impairment of balance 02/2015  Consequent of postconcussive syndrome   Injury of triangular fibrocartilage complex of left wrist 02/25/2019   Dx 02/25/19 Bradly Bienenstock IV MD ([facility])   Insulin resistance 07/04/2017   Internal hemorrhoid 01/28/2021   Internal hemorrhoid seen on colonoscopy 10/2020 Roderic Scarce MD [facility])   Interstitial cystitis    Irritable bowel syndrome with diarrhea 04/03/2016   Left ventricular hypertrophy, mild 02/25/2016   ECHOcardiogram report 06/17/15 showing EF55-60%, mild LVH and G1DD     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HCC) 03/10/2018   [facility]   Lumbar facet joint pain    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   Mood disorder (HCC)    Morbid obesity (HCC) 03/06/2017   Musculoskeletal neck pain 07/14/2015   Nocturnal hypoxemia 03/06/2017   Normal coronary arteries 05/14/2014   Obesity (BMI 30.0-34.9) 01/29/2017   Odynophagia 12/20/2021   Osteoarthritis of left hip 11/01/2015   MRI order by Dr Charlann Boxer (ortho) 10/2015 showed significant arthritis of left hip joint with cystic changes in femoral head c/w osteoarthritis   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, lumbar region 10/30/2011   Other insomnia 11/09/2016   Overweight    Pain in joint of left shoulder 11/09/2016   Pain in joint, multiple sites 11/18/2018   Palpitations 05/15/2013    Cardiology manages   Perianal candidiasis 12/20/2021   Periodic limb movement sleep disorder 03/28/2017   Perirectal cyst 05/07/2016   Poison ivy dermatitis 03/24/2021   PONV (postoperative nausea and vomiting)    Positive ANA (antinuclear antibody) 11/18/2018   Post concussion syndrome 06/06/2015   Post concussive syndrome 07/14/2015   Ms Macgowan's post-concussive syndrome manifesting in vertigo and headache, mood changes, poor balance, dizziness, and decreased concentration per Dr Lucia Gaskins at [facility].    Posterior vitreous detachment of right eye 2014   Premature menopause 12/20/2021   Rectal abscess 05/10/2021   S/P left THA, AA 07/25/2016   Sacroiliac inflammation (HCC) 08/18/2021   Shingles    Shortness of breath dyspnea    with exertion   Sicca syndrome (HCC) 11/18/2018   (+) ANA   Sleep walking and eating 03/06/2017   Snoring 03/06/2017   Spondylosis of lumbar region without myelopathy or radiculopathy 10/30/2011   TFC (triangular fibrocartilage complex) injury 02/25/2019   Thyroid nodule 08/11/2009   Findings: The thyroid gland is within normal limits in size.  The gland is  diffusely inhomogeneous. A small solid nodule is noted in the lower pole  medially on the right of 7 x 6 x 8 mm. A small solid nodule is noted inferiorly on the left of 3 x 3 x 4 mm.  IMPRESSION:  The thyroid gland is within normal limits in size with only small solid nodules present, the largest of only 8 mm in diameter on the right.     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    Trochanteric bursitis, right hip 04/26/2020   Tubular adenoma of colon 01/28/2021   Colonoscopy screening 4 mm tubular Adenoma polyp Charlott Rakes, MD [facility])   Tubular adenoma of colon 01/28/2021   Colonoscopy screening 4 mm tubular Adenoma polyp Charlott Rakes, MD [facility])   Vasomotor symptoms due to menopause 04/19/2017   Vitamin D deficiency 05/08/2017   Vulvitis 07/22/2020   Yeast vaginitis 07/11/2019   Past Surgical History:  Procedure Laterality Date   arthroscopy Left 01/2022   with SAD and DCR, K. Supple MD   Bladder dilitation  x 3   BREAST BIOPSY Right 2011   Benign histology   CARDIOVASCULAR STRESS TEST  2000   Unremarkable per pt report   CARPOMETACARPAL JOINT ARTHROTOMY Right 2011   COLONOSCOPY     COLONOSCOPY WITH PROPOFOL N/A 04/21/2015   Procedure: COLONOSCOPY WITH PROPOFOL;  Surgeon: Jeani Hawking, MD;  Location: [facility] ENDOSCOPY;  Service: Endoscopy;  Laterality: N/A;   CYSTOSCOPY W/ DILATION OF BLADDER N/A    EPIDURAL BLOCK INJECTION Left 04/12/2016   Left Medial Nerve Block and Left L5 ramus block, Dr Sheran Luz    EPIDURAL BLOCK INJECTION  03/21/2016   Left L3-4 medial branch block and Left L5 & dorsal ramus block    EPIDURAL BLOCK INJECTION N/A 10/25/2016   Sheran Luz, MD. Lumbar medial branch block   EPIDURAL BLOCK INJECTION N/A 02/09/2017   Sheran Luz, MD.  Bilateral L3/4 medial branch block, bilateral L5 dorsal ramus block   EPIDURAL BLOCK INJECTION N/A 07/04/2017   Sheran Luz, MD   FECAL  TRANSPLANT  04/21/2015   Procedure: FECAL TRANSPLANT;  Surgeon: Jeani Hawking, MD;  Location: [facility] ENDOSCOPY;  Service: Endoscopy;;   HIP ARTHROPLASTY Left    HIP ARTHROSCOPY Left 03/06/2018   Left hip arthroplasty, redo for loose hip arthroplasty. Procedure at [facility]   INJECTION HIP INTRA ARTICULAR Left 11/2015   for OA by Dr Vilma Prader IMPLANT PLACEMENT  04/2021   Floyde Parkins MD ([facility])   INTERSTIM IMPLANT REVISION N/A 09/2021   Floyde Parkins MD ([facility])   OTHER SURGICAL HISTORY Left 2016   Left L3/L4 medial nerve block and Left L5 Dorsal Ramus block Dr Rosine Abe   TOTAL HIP ARTHROPLASTY Left 07/25/2016   Procedure: LEFT TOTAL HIP ARTHROPLASTY ANTERIOR APPROACH;  Surgeon: Durene Romans, MD;  Location: [facility];  Service: Orthopedics;  Laterality: Left;   Patient Active Problem List   Diagnosis Date Noted   Low serum vitamin B12 01/10/2023   OSA (obstructive sleep apnea) 12/27/2022   BMI 30.0-30.9,adult 12/27/2022   Severe obstructive sleep apnea-hypopnea syndrome 12/22/2022   Hyperglycemia 12/11/2022   Depression screening 12/11/2022   Generalized obesity 12/11/2022   SOBOE (shortness of breath on exertion) 12/11/2022   Other fatigue 12/11/2022   Sleep related headaches 11/07/2022   Intractable episodic cluster headache 11/07/2022   Sleep walking and eating 11/07/2022   Ambien use disorder, 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   Psychophysiological insomnia 11/07/2022   Rhinitis 11/03/2022   Hallux rigidus of right foot 09/25/2022   Morton neuroma, right 09/25/2022   Iliopsoas bursitis of left hip 05/08/2022   Chronic pain of right knee 05/08/2022   Arthrosis of hand 12/20/2021   History of Clostridioides difficile colitis, required fecal transplantation 12/20/2021   Mixed hyperlipidemia 12/20/2021   Meibomian gland dysfunction (MGD) of both eyes 10/17/2021   Nuclear sclerosis of both eyes 10/17/2021   Epiretinal membrane (ERM) of left eye  10/17/2021   OAB (overactive bladder) 03/02/2021   Osteoarthritis of acromioclavicular joint 02/22/2021   Hyperhidrosis, scalp, 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   Chronic low back pain 05/14/2019   Cervical spondylosis 11/18/2018   DDD (degenerative disc disease), lumbar 11/18/2018   Keratoconjunctivitis sicca (HCC) 11/18/2018   Chronic contact dermatitis 08/06/2018   Primary osteoarthritis of left knee 06/20/2018   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HCC) 11/29/2017   Insulin resistance 07/04/2017   Vitamin D deficiency 05/08/2017   Periodic limb movement sleep disorder 03/28/2017   Obesity (BMI 30.0-34.9) 01/29/2017   Other insomnia  11/09/2016   Irritable bowel syndrome with diarrhea 04/03/2016   Left ventricular hypertrophy, mild 02/25/2016   Mood disorder (HCC) 07/02/2015   Allergic rhinoconjunctivitis 07/02/2015   Fibromyalgia syndrome 07/01/2015   Personal history of colonic polyps 07/01/2015   GERD (gastroesophageal reflux disease) 12/16/2014   Hypothyroidism 05/15/2013   Pure hypercholesterolemia 05/15/2013   Chronic interstitial cystitis 05/15/2013   Chronic migraine without aura 05/15/2013    PCP: McDiarmid, Leighton Roach, MD   REFERRING PROVIDER: Molly Maduro, MD   REFERRING DIAG: M25.551 Pain in right hip  THERAPY DIAG:  Pain in right hip  Stiffness of right hip, not elsewhere classified  Muscle weakness (generalized)  Difficulty in walking, not elsewhere classified  Cramp and spasm  Rationale for Evaluation and Treatment: Rehabilitation  ONSET DATE: 12/11/22  SUBJECTIVE:   SUBJECTIVE STATEMENT: I did a lot of walking over the weekend.  I went to my grandson's ball game and had to walk over a lot of rough terrain.  She also [place] to walk a lot there as well    PERTINENT HISTORY: Left hip THA PAIN:  Are you having pain?  4/10 Rt posterior- lateral hip, aggravated when hip is tired at the end of the day Pain is better at the start of  the day.    PRECAUTIONS: None  WEIGHT BEARING RESTRICTIONS: No  FALLS:  Has patient fallen in last 6 months? No  LIVING ENVIRONMENT: Lives with: lives with their spouse Lives in: House/apartment   OCCUPATION: Retired, she has sleep apnea and tends to sleep in, gets up to do housework, goes out with friends.  She was walking her dog but unable currently due to the pain.   PLOF: Independent, Independent with basic ADLs, Independent with household mobility without device, Independent with community mobility without device, Independent with homemaking with ambulation, Independent with gait, and Independent with transfers  PATIENT GOALS: To get out of pain and be able to walk and be more active  NEXT MD VISIT: prn  OBJECTIVE:   DIAGNOSTIC FINDINGS:  Impression: Ultrasound Undersurface fraying and probable low-grade partial tearing of the  distal gluteus minimus tendon at the greater trochanter with  moderate subgluteus bursitis.  PATIENT SURVEYS:  LEFS: complete next visit  COGNITION: Overall cognitive status: Within functional limits for tasks assessed     SENSATION: WFL   MUSCLE LENGTH: Hamstrings: Right 50 deg; Left 60 deg Thomas test: Right Positive; Left Positive  POSTURE: No Significant postural limitations  PALPATION: Tender over lateral right hip at greater trochanter  LOWER EXTREMITY ROM:  WFL  LOWER EXTREMITY MMT:  MMT Right eval Left eval  Hip flexion 4+ 4+  Hip extension 3+ 4  Hip abduction 3+ 3+  Hip adduction 5 5  Hip internal rotation 4- 4-  Hip external rotation 4- 4-   LOWER EXTREMITY SPECIAL TESTS:  Hip special tests: Luisa Hart (FABER) test: positive , Trendelenburg test: positive , Thomas test: positive , and Ober's test: positive   FUNCTIONAL TESTS:  5 times sit to stand: 11.67 sec Timed up and go (TUG): 8.57 sec  GAIT: Distance walked: 30 Assistive device utilized:  oc and None Level of assistance: Complete  Independence Comments: antalgic/trendelenburg   TODAY'S TREATMENT:      DATE: 01/23/23 Nustep x 6 min level 6 -PT present to discuss progress  Lateral band walks with green loop x 10 steps each way, 3 laps Dead lifts 10# kettlebell to touch tall cone 2  sets of 10 Squats x 10 Step up and hold fwd and lateral x 10 each LE in each direction Standing in front of the mirror in staggered stance position on balance pad x 2 min each leg fwd Squats to 20 inch box holding 5# kettlebell 2 sets of 5  DATE: 01/16/23 Nustep x 6 min level 3-PT present to discuss progress  Patient having difficulty with standing hip abduction exercise at home (demonstrates major lateral trunk lean compensatory strategy) Standing in front of the mirror with light touch on chair back in staggered stance position: cue to stand tall to activate glute medius 3x 5 Standing in front of the mirror with light touch on chair back in staggered stance position: with lateral toe touch 2x5 Dead lifts 5# kettlebell to touch tall cone 2 sets of 5 Squats to 20 inch box holding 5# kettlebell 2 sets of 5      DATE: 01/10/23 Nustep x 6 min level 5-PT present to discuss progress  Seated hamstring stretch 3x20 seconds  Sidelying clam and reverse clam x10 each Seated LAQ x 20 with 2 lbs Standing march 2# 2x10 Seated Hip ER with 2 lbs x 20 Sit to stand 2x10- pad in seat Sidyling abduction: tactile cues for alignment and reduced height to avoid hip flexor activation Standing hip extension and abduction: x10 bil with UE support                                                                                                                       PATIENT EDUCATION:  Education details: Initiated HEP and educated on leg length discrepency vs muscle imbalance Person educated: Patient Education method: Programmer, multimedia, Facilities manager, Verbal cues, and Handouts Education comprehension: verbalized understanding, returned demonstration, and verbal cues  required  HOME EXERCISE PROGRAM: Access Code: [email or web address] URL: [email or web address] Date: 01/16/2023 Prepared by: Lavinia Sharps  Exercises - Sidelying Hip Abduction  - 1 x daily - 7 x weekly - 2 sets - 10 reps - Clam with Resistance  - 1 x daily - 7 x weekly - 2 sets - 10 reps - Sidelying Reverse Clamshell with Resistance  - 1 x daily - 7 x weekly - 2 sets - 10 reps - Prone Hip Extension  - 1 x daily - 7 x weekly - 2 sets - 10 reps - Standing Hamstring Stretch on Chair  - 1 x daily - 7 x weekly - 1 sets - 3 reps - 30 sec hold - Standing Hip Extension with Counter Support  - 2 x daily - 7 x weekly - 2 sets - 10 reps - Standing Hip Abduction with Anterior Support  - 2 x daily - 7 x weekly - 2 sets - 10 reps - Sit to Stand Without Arm Support  - 2 x daily - 7 x weekly - 2 sets - 10 reps - Staggered Stance Step Throughs (Mirrored)  - 1 x daily - 7 x weekly - 2 sets - 5 reps - Squat with  Chair Touch  - 1 x daily - 7 x weekly - 2 sets - 5 reps - Half Deadlift with Kettlebell  - 1 x daily - 7 x weekly - 2 sets - 5 reps   ASSESSMENT:  CLINICAL IMPRESSION: Molly Giles is progressing appropriately. She is demonstrating improved balance strategies.  She should continue to do well.    OBJECTIVE IMPAIRMENTS: Abnormal gait, difficulty walking, decreased ROM, decreased strength, increased fascial restrictions, increased muscle spasms, impaired flexibility, and pain.   ACTIVITY LIMITATIONS: bending, sitting, squatting, stairs, transfers, bed mobility, and dressing  PARTICIPATION LIMITATIONS: meal prep, cleaning, laundry, driving, shopping, community activity, and yard work  PERSONAL FACTORS: Age, Fitness, and Past/current experiences are also affecting patient's functional outcome.   REHAB POTENTIAL: Good  CLINICAL DECISION MAKING: Stable/uncomplicated  EVALUATION COMPLEXITY: Low   GOALS: Goals reviewed with patient? Yes  SHORT TERM GOALS: Target date: 01/29/2023  Pain report to  be no greater than 4/10  Baseline: Goal status: INITIAL  2.  Patient will be independent with initial HEP  Baseline:  Goal status: INITIAL   LONG TERM GOALS: Target date: 02/26/2023  Patient to report pain no greater than 2/10  Baseline:  Goal status: INITIAL  2.  Patient to be independent with advanced HEP  Baseline:  Goal status: INITIAL  3.  Patient to be able to sleep through the night  Baseline:  Goal status: INITIAL  4.  Patient to be able to stand or walk for at least 15 min without right hip pain Baseline:  Goal status: INITIAL  5.  Functional scores to improve by 1-3 seconds Baseline:  Goal status: INITIAL  6.  Patient to report 85% improvement in overall symptoms  Baseline:  Goal status: INITIAL   PLAN:  PT FREQUENCY: 1-2x/week  PT DURATION: 8 weeks  PLANNED INTERVENTIONS: Therapeutic exercises, Therapeutic activity, Neuromuscular re-education, Balance training, Gait training, Patient/Family education, Self Care, Joint mobilization, Stair training, DME instructions, Aquatic Therapy, Dry Needling, Electrical stimulation, Spinal mobilization, Cryotherapy, Moist heat, Taping, Traction, Ultrasound, Ionotophoresis 4mg /ml Dexamethasone, Manual therapy, and Re-evaluation  PLAN FOR NEXT SESSION: ;  Nustep, review new HEP, focus on bilateral hip stability training and core strength.  Mentioned DN but defers at this time (had in the past)  Victorino Dike B. Caylen Yardley, PT 01/24/23 12:40 AM  [facility] [address], [address] [place], [place] [address] Phone # [phone] Fax [phone]

## 2023-01-23 NOTE — Telephone Encounter (Signed)
Tammy, Production designer, theatre/television/film of [facility], spoke to her manager and they did reach out to patient to try to advise ways to resolve smell in mask and hose. Pt stated she is sensitive to smell and just can't tolerate machine and will return it.

## 2023-01-24 ENCOUNTER — Ambulatory Visit (INDEPENDENT_AMBULATORY_CARE_PROVIDER_SITE_OTHER): Payer: Medicare Other | Admitting: Family Medicine

## 2023-01-24 ENCOUNTER — Encounter (INDEPENDENT_AMBULATORY_CARE_PROVIDER_SITE_OTHER): Payer: Self-pay | Admitting: Family Medicine

## 2023-01-24 VITALS — BP 108/83 | HR 97 | Temp 98.2°F | Ht 66.0 in | Wt 189.0 lb

## 2023-01-24 DIAGNOSIS — Z6830 Body mass index (BMI) 30.0-30.9, adult: Secondary | ICD-10-CM | POA: Diagnosis not present

## 2023-01-24 DIAGNOSIS — E88819 Insulin resistance, unspecified: Secondary | ICD-10-CM

## 2023-01-24 DIAGNOSIS — E669 Obesity, unspecified: Secondary | ICD-10-CM

## 2023-01-24 DIAGNOSIS — G4733 Obstructive sleep apnea (adult) (pediatric): Secondary | ICD-10-CM

## 2023-01-24 MED ORDER — METFORMIN HCL 500 MG PO TABS
ORAL_TABLET | ORAL | 0 refills | Status: DC
Start: 2023-01-24 — End: 2023-03-29

## 2023-01-29 NOTE — Progress Notes (Unsigned)
Chief Complaint:   OBESITY Molly Giles is here to discuss her progress with her obesity treatment plan along with follow-up of her obesity related diagnoses. Molly Giles is on the Category 1 Plan + 100 calories and states she is following her eating plan approximately 96% of the time. Molly Giles states she is doing physical therapy for 45 minutes 2 times per week.  Today's visit was #: 4 Starting weight: 199 lbs Starting date: 12/11/2022 Today's weight: 189 lbs Today's date: 01/24/2023 Total lbs lost to date: 10 Total lbs lost since last in-office visit: 1  Interim History: Molly Giles continues to lose weight on her Category 1 eating plan. She is working on increasing protein and exercise, and she is doing well overall. She will be traveling soon and she is making strategies to make better food choices.   Subjective:   1. Insulin resistance Molly Giles was prescribed a low dose of metformin (250 mg daily) at her last visit. She is doing well with it with no nausea or vomiting.   2. OSA (obstructive sleep apnea) Molly Giles recently started a CPAP and she is very frustrated that the plastic is off-gasify and gives her a headache. It also doesn't fit well.   Assessment/Plan:   1. Insulin resistance Molly Giles will continue metformin, and we will refill for 1 month.   - metFORMIN (GLUCOPHAGE) 500 MG tablet; TAKE 1/2 TABLET(250 MG) BY MOUTH DAILY  Dispense: 45 tablet; Refill: 0  2. OSA (obstructive sleep apnea) Molly Giles is to try another mask and she was given tips on how to slowly acclimate herself to the CPAP. She was offered support and encouragement. We will follow-up in 2 weeks.   3. Obesity, Beginning BMI 32.1  4. BMI 30.0-30.9,adult Current BMI 30.8 Molly Giles is currently in the action stage of change. As such, her goal is to continue with weight loss efforts. She has agreed to the Category 1 Plan.   Exercise goals: As is.   Behavioral modification strategies: increasing lean protein intake and travel eating  strategies.  Molly Giles has agreed to follow-up with our clinic in 3 weeks. She was informed of the importance of frequent follow-up visits to maximize her success with intensive lifestyle modifications for her multiple health conditions.   Objective:   Blood pressure 108/83, pulse 97, temperature 98.2 F (36.8 C), height 5\' 6"  (1.676 m), weight 189 lb (85.7 kg), SpO2 94 %. Body mass index is 30.51 kg/m.  Lab Results  Component Value Date   CREATININE 0.88 12/11/2022   BUN 16 12/11/2022   NA 138 12/11/2022   K 4.0 12/11/2022   CL 103 12/11/2022   CO2 19 (L) 12/11/2022   Lab Results  Component Value Date   ALT 20 12/11/2022   AST 19 12/11/2022   ALKPHOS 81 12/11/2022   BILITOT 0.3 12/11/2022   Lab Results  Component Value Date   HGBA1C 5.3 12/11/2022   HGBA1C 5.2 06/26/2019   HGBA1C 5.0 02/07/2018   HGBA1C 5.2 07/04/2017   HGBA1C 5.3 03/22/2017   Lab Results  Component Value Date   INSULIN 7.8 12/11/2022   INSULIN 8.3 05/07/2018   INSULIN 11.8 07/04/2017   INSULIN 10.5 03/22/2017   Lab Results  Component Value Date   TSH 4.960 (H) 12/11/2022   Lab Results  Component Value Date   CHOL 176 12/11/2022   HDL 54 12/11/2022   LDLCALC 101 (H) 12/11/2022   TRIG 117 12/11/2022   CHOLHDL 3.3 10/11/2021   Lab Results  Component Value Date  VD25OH 41.2 12/11/2022   VD25OH 43.3 05/07/2018   VD25OH 48.5 07/04/2017   Lab Results  Component Value Date   WBC 6.4 08/04/2020   HGB 13.5 08/04/2020   HCT 41 08/04/2020   MCV 95.0 07/05/2019   PLT 262 08/04/2020   No results found for: "IRON", "TIBC", "FERRITIN"  Attestation Statements:   Reviewed by clinician on day of visit: allergies, medications, problem list, medical history, surgical history, family history, social history, and previous encounter notes.  Time spent on visit including pre-visit chart review and post-visit care and charting was 40 minutes.   I, Burt Knack, am acting as transcriptionist for Quillian Quince, MD.  I have reviewed the above documentation for accuracy and completeness, and I agree with the above. -  ***

## 2023-01-30 ENCOUNTER — Ambulatory Visit: Payer: Medicare Other

## 2023-02-01 ENCOUNTER — Ambulatory Visit: Payer: Medicare Other

## 2023-02-01 DIAGNOSIS — R252 Cramp and spasm: Secondary | ICD-10-CM

## 2023-02-01 DIAGNOSIS — R262 Difficulty in walking, not elsewhere classified: Secondary | ICD-10-CM

## 2023-02-01 DIAGNOSIS — M62838 Other muscle spasm: Secondary | ICD-10-CM

## 2023-02-01 DIAGNOSIS — M25551 Pain in right hip: Secondary | ICD-10-CM | POA: Diagnosis not present

## 2023-02-01 DIAGNOSIS — M25651 Stiffness of right hip, not elsewhere classified: Secondary | ICD-10-CM

## 2023-02-01 DIAGNOSIS — M6281 Muscle weakness (generalized): Secondary | ICD-10-CM

## 2023-02-01 NOTE — Therapy (Signed)
OUTPATIENT PHYSICAL THERAPY TREATMENT   Patient Name: Molly Giles MRN: [id number] DOB:09-Jun-1956, 67 y.o., female Today's Date: 02/01/2023  END OF SESSION:  PT End of Session - 02/01/23 1807     Visit Number 6    Date for PT Re-Evaluation 02/26/23    Authorization Type Medicare A and B    Progress Note Due on Visit 10    PT Start Time 1453    PT Stop Time 1531    PT Time Calculation (min) 38 min    Activity Tolerance Patient tolerated treatment well    Behavior During Therapy [facility] for tasks assessed/performed              Past Medical History:  Diagnosis Date   Abdominal bloating 05/10/2021   Abdominal discomfort, generalized 05/07/2016   Acute gastritis 11/19/2020   Allergic rhinoconjunctivitis 07/02/2015   Anal fissure 11/19/2020   Anterior to posterior tear of superior glenoid labrum of left shoulder 02/22/2021   Arthritis    Benign positional vertigo 04/2015   Responded well to [facility]   Bruxism (teeth grinding)    Chronic contact dermatitis 08/06/2018   Per allergist Dr Edwyna Ready.    Chronic migraine 02/25/2016   Per Dr Lucia Gaskins review in notes from [facility] from September 2015. Showed total headache days last month 18. Severe headache days 7 days. Moderate headache days 5 days. Mild headache days last month sick days. Days without headache last month 10 days. Symptoms associated with photophobia, phonophobia, osmophobia, neck pain, dizziness, jaw pain, nasal congestion, vision disturbances, tingling and numbness, weakness and worsening with activity. Each headache attack last 3 hours depending on treatment in severity. Left side, the right side, easier side, the frontal area in the back of the head. Characterized as throbbing, pressure, tightness, squeezing, stabbing and burning    Chronic migraine without aura 05/15/2013    Dr Lucia Gaskins [facility]   Chronic tonsillitis 01/27/2019   DDD (degenerative disc disease),  cervical 11/18/2018   DDD (degenerative disc disease), lumbar    Depression    Disc displacement, lumbar    Dysphagia 10/11/2021   Dysrhythmia    seen by dr Donnie Aho- not a problem since she has been on Bystolic   Episodic cluster headache, not intractable 03/06/2017   Essential hypertension 05/15/2013   Family history of adverse reaction to anesthesia    Brother- N/V   Family history of premature CAD 05/15/2013   Fibromyalgia syndrome 07/01/2015   Management by Dr Kathie Rhodes. Devashwar (Rheum)    GERD (gastroesophageal reflux disease) 12/16/2014   H/O seasonal allergies    Hallux rigidus of right foot 09/25/2022   Hammer toe    Left great toe   Hashimoto's thyroiditis    Per patient, diagnosed by Dr. Ferdinand Cava   Hearing loss of both ears 07/27/2015   mild to borderline moderate low frequency hearing loss improving to within normal limits bilaterally on audiology testing at [facility] in November 2016.     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   Hyperlipidemia 1998   Hypokalemia due to excessive gastrointestinal loss of potassium 07/28/2019   Hypothyroidism    Impairment of balance 02/2015  Consequent of postconcussive syndrome   Injury of triangular fibrocartilage complex of left wrist 02/25/2019   Dx 02/25/19 Bradly Bienenstock IV MD ([facility])   Insulin resistance 07/04/2017   Internal hemorrhoid 01/28/2021   Internal hemorrhoid seen on colonoscopy 10/2020 Roderic Scarce MD [facility])   Interstitial cystitis    Irritable bowel syndrome with diarrhea 04/03/2016   Left ventricular hypertrophy, mild 02/25/2016   ECHOcardiogram report 06/17/15 showing EF55-60%, mild LVH and G1DD     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HCC) 03/10/2018   [facility]   Lumbar facet joint pain    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   Mood disorder (HCC)    Morbid obesity (HCC) 03/06/2017   Musculoskeletal neck pain 07/14/2015   Nocturnal hypoxemia 03/06/2017   Normal coronary arteries 05/14/2014   Obesity (BMI 30.0-34.9) 01/29/2017   Odynophagia 12/20/2021   Osteoarthritis of left hip 11/01/2015   MRI order by Dr Charlann Boxer (ortho) 10/2015 showed significant arthritis of left hip joint with cystic changes in femoral head c/w osteoarthritis   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, lumbar region 10/30/2011   Other insomnia 11/09/2016   Overweight    Pain in joint of left shoulder 11/09/2016   Pain in joint, multiple sites 11/18/2018   Palpitations 05/15/2013   [facility] manages   Perianal candidiasis 12/20/2021   Periodic limb movement sleep disorder 03/28/2017   Perirectal cyst 05/07/2016   Poison ivy dermatitis 03/24/2021   PONV (postoperative nausea and vomiting)    Positive ANA (antinuclear antibody) 11/18/2018   Post concussion syndrome 06/06/2015   Post concussive syndrome 07/14/2015   Ms Hammad's post-concussive syndrome manifesting in vertigo and headache, mood changes, poor balance, dizziness, and decreased concentration per Dr Lucia Gaskins at [facility].    Posterior vitreous detachment of right eye 2014   Premature menopause 12/20/2021   Rectal abscess 05/10/2021   S/P left THA, AA 07/25/2016   Sacroiliac inflammation (HCC) 08/18/2021   Shingles    Shortness of breath dyspnea    with exertion   Sicca syndrome (HCC) 11/18/2018   (+) ANA   Sleep walking and eating 03/06/2017   Snoring 03/06/2017   Spondylosis of lumbar region without myelopathy or radiculopathy 10/30/2011   TFC (triangular fibrocartilage complex) injury 02/25/2019   Thyroid nodule 08/11/2009   Findings: The thyroid gland is within normal limits in size.  The gland is  diffusely inhomogeneous. A small solid nodule is noted in the lower pole  medially on the right of 7 x 6 x 8 mm. A small solid nodule is noted inferiorly on the left of 3 x 3 x 4 mm.  IMPRESSION:  The thyroid gland is within normal limits in size with only small solid nodules present, the largest of only 8 mm in diameter on the right.     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    Trochanteric bursitis, right hip 04/26/2020   Tubular adenoma of colon 01/28/2021   Colonoscopy screening 4 mm tubular Adenoma polyp Charlott Rakes, MD [facility])   Tubular adenoma of colon 01/28/2021   Colonoscopy screening 4 mm tubular Adenoma polyp Charlott Rakes, MD [facility])   Vasomotor symptoms due to menopause 04/19/2017   Vitamin D deficiency 05/08/2017   Vulvitis 07/22/2020   Yeast vaginitis 07/11/2019   Past Surgical History:  Procedure Laterality Date   arthroscopy Left 01/2022   with SAD and DCR, K. Supple MD   Bladder dilitation  x 3   BREAST BIOPSY Right 2011   Benign histology   CARDIOVASCULAR STRESS TEST  2000   Unremarkable per pt report   CARPOMETACARPAL JOINT ARTHROTOMY Right 2011   COLONOSCOPY     COLONOSCOPY WITH PROPOFOL N/A 04/21/2015   Procedure: COLONOSCOPY WITH PROPOFOL;  Surgeon: Jeani Hawking, MD;  Location[facility] ENDOSCOPY;  Service: Endoscopy;  Laterality: N/A;   CYSTOSCOPY W/ DILATION OF BLADDER N/A    EPIDURAL BLOCK INJECTION Left 04/12/2016   Left Medial Nerve Block and Left L5 ramus block, Dr Sheran Luz    EPIDURAL BLOCK INJECTION  03/21/2016   Left L3-4 medial branch block and Left L5 & dorsal ramus block    EPIDURAL BLOCK INJECTION N/A 10/25/2016   Sheran Luz, MD. Lumbar medial branch block   EPIDURAL BLOCK INJECTION N/A 02/09/2017   Sheran Luz, MD.  Bilateral L3/4 medial branch block, bilateral L5 dorsal ramus block   EPIDURAL BLOCK INJECTION N/A 07/04/2017   Sheran Luz, MD   FECAL  TRANSPLANT  04/21/2015   Procedure: FECAL TRANSPLANT;  Surgeon: Jeani Hawking, MD;  Location[facility] ENDOSCOPY;  Service: Endoscopy;;   HIP ARTHROPLASTY Left    HIP ARTHROSCOPY Left 03/06/2018   Left hip arthroplasty, redo for loose hip arthroplasty. Procedure at [facility] hospital   INJECTION HIP INTRA ARTICULAR Left 11/2015   for OA by Dr Vilma Prader IMPLANT PLACEMENT  04/2021   Floyde Parkins MD ([facility])   INTERSTIM IMPLANT REVISION N/A 09/2021   Floyde Parkins MD ([facility])   OTHER SURGICAL HISTORY Left 2016   Left L3/L4 medial nerve block and Left L5 Dorsal Ramus block Dr Rosine Abe   TOTAL HIP ARTHROPLASTY Left 07/25/2016   Procedure: LEFT TOTAL HIP ARTHROPLASTY ANTERIOR APPROACH;  Surgeon: Durene Romans, MD;  Location: [facility];  Service: Orthopedics;  Laterality: Left;   Patient Active Problem List   Diagnosis Date Noted   Low serum vitamin B12 01/10/2023   OSA (obstructive sleep apnea) 12/27/2022   BMI 30.0-30.9,adult 12/27/2022   Severe obstructive sleep apnea-hypopnea syndrome 12/22/2022   Hyperglycemia 12/11/2022   Depression screening 12/11/2022   Generalized obesity 12/11/2022   SOBOE (shortness of breath on exertion) 12/11/2022   Other fatigue 12/11/2022   Sleep related headaches 11/07/2022   Intractable episodic cluster headache 11/07/2022   Sleep walking and eating 11/07/2022   Ambien use disorder, 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   Psychophysiological insomnia 11/07/2022   Rhinitis 11/03/2022   Hallux rigidus of right foot 09/25/2022   Morton neuroma, right 09/25/2022   Iliopsoas bursitis of left hip 05/08/2022   Chronic pain of right knee 05/08/2022   Arthrosis of hand 12/20/2021   History of Clostridioides difficile colitis, required fecal transplantation 12/20/2021   Mixed hyperlipidemia 12/20/2021   Meibomian gland dysfunction (MGD) of both eyes 10/17/2021   Nuclear sclerosis of both eyes 10/17/2021   Epiretinal membrane (ERM) of left eye  10/17/2021   OAB (overactive bladder) 03/02/2021   Osteoarthritis of acromioclavicular joint 02/22/2021   Hyperhidrosis, scalp, 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   Chronic low back pain 05/14/2019   Cervical spondylosis 11/18/2018   DDD (degenerative disc disease), lumbar 11/18/2018   Keratoconjunctivitis sicca (HCC) 11/18/2018   Chronic contact dermatitis 08/06/2018   Primary osteoarthritis of left knee 06/20/2018   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HCC) 11/29/2017   Insulin resistance 07/04/2017   Vitamin D deficiency 05/08/2017   Periodic limb movement sleep disorder 03/28/2017   Obesity (BMI 30.0-34.9) 01/29/2017   Other insomnia  11/09/2016   Irritable bowel syndrome with diarrhea 04/03/2016   Left ventricular hypertrophy, mild 02/25/2016   Mood disorder (HCC) 07/02/2015   Allergic rhinoconjunctivitis 07/02/2015   Fibromyalgia syndrome 07/01/2015   Personal history of colonic polyps 07/01/2015   GERD (gastroesophageal reflux disease) 12/16/2014   Hypothyroidism 05/15/2013   Pure hypercholesterolemia 05/15/2013   Chronic interstitial cystitis 05/15/2013   Chronic migraine without aura 05/15/2013    PCP: McDiarmid, Leighton Roach, MD   REFERRING PROVIDER: Molly Maduro, MD   REFERRING DIAG: M25.551 Pain in right hip  THERAPY DIAG:  Pain in right hip  Stiffness of right hip, not elsewhere classified  Muscle weakness (generalized)  Difficulty in walking, not elsewhere classified  Cramp and spasm  Other muscle spasm  Rationale for Evaluation and Treatment: Rehabilitation  ONSET DATE: 12/11/22  SUBJECTIVE:   SUBJECTIVE STATEMENT: I did really good on my trip to the beach in [place].  It only hurt if I walked for a really long time and didn't rest.  PERTINENT HISTORY: Left hip THA PAIN:  02/01/23 Are you having pain?  3/10 Rt posterior- lateral hip, aggravated when hip is tired at the end of the day Pain is better at the start of the day.    PRECAUTIONS: None  WEIGHT BEARING  RESTRICTIONS: No  FALLS:  Has patient fallen in last 6 months? No  LIVING ENVIRONMENT: Lives with: lives with their spouse Lives in: House/apartment   OCCUPATION: Retired, she has sleep apnea and tends to sleep in, gets up to do housework, goes out with friends.  She was walking her dog but unable currently due to the pain.   PLOF: Independent, Independent with basic ADLs, Independent with household mobility without device, Independent with community mobility without device, Independent with homemaking with ambulation, Independent with gait, and Independent with transfers  PATIENT GOALS: To get out of pain and be able to walk and be more active  NEXT MD VISIT: prn  OBJECTIVE:   DIAGNOSTIC FINDINGS:  Impression: Ultrasound Undersurface fraying and probable low-grade partial tearing of the  distal gluteus minimus tendon at the greater trochanter with  moderate subgluteus bursitis.  PATIENT SURVEYS:  LEFS: complete next visit  COGNITION: Overall cognitive status: Within functional limits for tasks assessed     SENSATION: WFL   MUSCLE LENGTH: Hamstrings: Right 50 deg; Left 60 deg Thomas test: Right Positive; Left Positive  POSTURE: No Significant postural limitations  PALPATION: Tender over lateral right hip at greater trochanter  LOWER EXTREMITY ROM:  WFL  LOWER EXTREMITY MMT:  MMT Right eval Left eval  Hip flexion 4+ 4+  Hip extension 3+ 4  Hip abduction 3+ 3+  Hip adduction 5 5  Hip internal rotation 4- 4-  Hip external rotation 4- 4-   LOWER EXTREMITY SPECIAL TESTS:  Hip special tests: Luisa Hart (FABER) test: positive , Trendelenburg test: positive , Thomas test: positive , and Ober's test: positive   FUNCTIONAL TESTS:  5 times sit to stand: 11.67 sec Timed up and go (TUG): 8.57 sec  GAIT: Distance walked: 30 Assistive device utilized:  oc and None Level of assistance: Complete Independence Comments: antalgic/trendelenburg   TODAY'S TREATMENT:       DATE: 02/01/23 Nustep x 5 min level 6 -PT present to discuss progress  Lateral band walks with blue loop x 10 steps each way, 3 laps Standing hip abduction in slight squat position with blue loop x 20 Cone touches 3 x 10 each LE (patient had very easy time with cone  on counter, switched to cone on seat of lat pull down) Single leg dead lifts 10# kettlebell to touch tall cone 2 sets of 10  Squats x 10 to chair Hip matrix x 20 each on each LE hip abduction and hip extension (verbal cues needed for proper hip position on abduction) Hook lying clam with blue loop x 20 Side lying clam with blue loop x 20 each side Side lying hip IR with blue loop x 10 each side Reviewed technique on side lying hip abduction for HEP  DATE: 01/23/23 Nustep x 6 min level 6 -PT present to discuss progress  Lateral band walks with green loop x 10 steps each way, 3 laps Dead lifts 10# kettlebell to touch tall cone 2 sets of 10 Squats x 10 Step up and hold fwd and lateral x 10 each LE in each direction Standing in front of the mirror in staggered stance position on balance pad x 2 min each leg fwd Squats to 20 inch box holding 5# kettlebell 2 sets of 5  DATE: 01/16/23 Nustep x 6 min level 3-PT present to discuss progress  Patient having difficulty with standing hip abduction exercise at home (demonstrates major lateral trunk lean compensatory strategy) Standing in front of the mirror with light touch on chair back in staggered stance position: cue to stand tall to activate glute medius 3x 5 Standing in front of the mirror with light touch on chair back in staggered stance position: with lateral toe touch 2x5 Dead lifts 5# kettlebell to touch tall cone 2 sets of 5 Squats to 20 inch box holding 5# kettlebell 2 sets of 5                                                                                                                     PATIENT EDUCATION:  Education details: Initiated HEP and educated on leg length  discrepency vs muscle imbalance Person educated: Patient Education method: Programmer, multimedia, Facilities manager, Verbal cues, and Handouts Education comprehension: verbalized understanding, returned demonstration, and verbal cues required  HOME EXERCISE PROGRAM: Access Code: [email or web address] URL: [email or web address] Date: 01/16/2023 Prepared by: Lavinia Sharps  Exercises - Sidelying Hip Abduction  - 1 x daily - 7 x weekly - 2 sets - 10 reps - Clam with Resistance  - 1 x daily - 7 x weekly - 2 sets - 10 reps - Sidelying Reverse Clamshell with Resistance  - 1 x daily - 7 x weekly - 2 sets - 10 reps - Prone Hip Extension  - 1 x daily - 7 x weekly - 2 sets - 10 reps - Standing Hamstring Stretch on Chair  - 1 x daily - 7 x weekly - 1 sets - 3 reps - 30 sec hold - Standing Hip Extension with Counter Support  - 2 x daily - 7 x weekly - 2 sets - 10 reps - Standing Hip Abduction with Anterior Support  - 2 x daily - 7 x weekly - 2 sets - 10 reps -  Sit to Stand Without Arm Support  - 2 x daily - 7 x weekly - 2 sets - 10 reps - Staggered Stance Step Throughs (Mirrored)  - 1 x daily - 7 x weekly - 2 sets - 5 reps - Squat with Chair Touch  - 1 x daily - 7 x weekly - 2 sets - 5 reps - Half Deadlift with Kettlebell  - 1 x daily - 7 x weekly - 2 sets - 5 reps   ASSESSMENT:  CLINICAL IMPRESSION: Sarahgrace demonstrates significant improvement in hip stability and strength but she does continue to fatigue easily.  She did very well on her trip to the beach and had very little pain to speak of unless she was walking long distances.  She demonstrates improved hip strategy on SL dynamic balance activity.  She had some minor complaints of right sided low back pain during single leg dead lifts.  After correcting her hip symmetry, this was eliminated.  She should continue to do well.  She would benefit from continuing skilled PT for core and hip strengthening along with flexibility exercises.      OBJECTIVE  IMPAIRMENTS: Abnormal gait, difficulty walking, decreased ROM, decreased strength, increased fascial restrictions, increased muscle spasms, impaired flexibility, and pain.   ACTIVITY LIMITATIONS: bending, sitting, squatting, stairs, transfers, bed mobility, and dressing  PARTICIPATION LIMITATIONS: meal prep, cleaning, laundry, driving, shopping, community activity, and yard work  PERSONAL FACTORS: Age, Fitness, and Past/current experiences are also affecting patient's functional outcome.   REHAB POTENTIAL: Good  CLINICAL DECISION MAKING: Stable/uncomplicated  EVALUATION COMPLEXITY: Low   GOALS: Goals reviewed with patient? Yes  SHORT TERM GOALS: Target date: 01/29/2023  Pain report to be no greater than 4/10  Baseline: Goal status: MET  2.  Patient will be independent with initial HEP  Baseline:  Goal status: MET   LONG TERM GOALS: Target date: 02/26/2023  Patient to report pain no greater than 2/10  Baseline:  Goal status: IN PROGRESS  2.  Patient to be independent with advanced HEP  Baseline:  Goal status: IN PROGRESS  3.  Patient to be able to sleep through the night  Baseline:  Goal status: IN PROGRESS  4.  Patient to be able to stand or walk for at least 15 min without right hip pain Baseline:  Goal status: INITIAL  5.  Functional scores to improve by 1-3 seconds Baseline:  Goal status: INITIAL  6.  Patient to report 85% improvement in overall symptoms  Baseline:  Goal status: INITIAL   PLAN:  PT FREQUENCY: 1-2x/week  PT DURATION: 8 weeks  PLANNED INTERVENTIONS: Therapeutic exercises, Therapeutic activity, Neuromuscular re-education, Balance training, Gait training, Patient/Family education, Self Care, Joint mobilization, Stair training, DME instructions, Aquatic Therapy, Dry Needling, Electrical stimulation, Spinal mobilization, Cryotherapy, Moist heat, Taping, Traction, Ultrasound, Ionotophoresis 4mg /ml Dexamethasone, Manual therapy, and  Re-evaluation  PLAN FOR NEXT SESSION: ;  Nustep, review new HEP, focus on bilateral hip stability training and core strength.  Mentioned DN but defers at this time (had in the past)  Victorino Dike B. Karcyn Menn, PT 02/01/23 6:10 PM  [facility] [address], [address] [place], [place] [address] Phone # [phone] Fax [phone]

## 2023-02-07 ENCOUNTER — Ambulatory Visit: Payer: Medicare Other

## 2023-02-07 ENCOUNTER — Encounter (INDEPENDENT_AMBULATORY_CARE_PROVIDER_SITE_OTHER): Payer: Self-pay | Admitting: Physician Assistant

## 2023-02-07 ENCOUNTER — Ambulatory Visit (INDEPENDENT_AMBULATORY_CARE_PROVIDER_SITE_OTHER): Payer: Medicare Other | Admitting: Physician Assistant

## 2023-02-07 VITALS — BP 100/71 | HR 96 | Temp 97.6°F | Ht 66.0 in | Wt 185.0 lb

## 2023-02-07 DIAGNOSIS — E669 Obesity, unspecified: Secondary | ICD-10-CM

## 2023-02-07 DIAGNOSIS — Z6828 Body mass index (BMI) 28.0-28.9, adult: Secondary | ICD-10-CM | POA: Insufficient documentation

## 2023-02-07 DIAGNOSIS — E039 Hypothyroidism, unspecified: Secondary | ICD-10-CM

## 2023-02-07 DIAGNOSIS — E88819 Insulin resistance, unspecified: Secondary | ICD-10-CM

## 2023-02-07 DIAGNOSIS — Z6829 Body mass index (BMI) 29.0-29.9, adult: Secondary | ICD-10-CM | POA: Diagnosis not present

## 2023-02-07 DIAGNOSIS — R21 Rash and other nonspecific skin eruption: Secondary | ICD-10-CM | POA: Diagnosis not present

## 2023-02-07 DIAGNOSIS — G4733 Obstructive sleep apnea (adult) (pediatric): Secondary | ICD-10-CM

## 2023-02-07 DIAGNOSIS — Z6827 Body mass index (BMI) 27.0-27.9, adult: Secondary | ICD-10-CM | POA: Insufficient documentation

## 2023-02-07 DIAGNOSIS — Z6826 Body mass index (BMI) 26.0-26.9, adult: Secondary | ICD-10-CM | POA: Insufficient documentation

## 2023-02-07 NOTE — Progress Notes (Signed)
.sm[facility]  Office: [phone]  /  Fax: [phone]  WEIGHT SUMMARY AND BIOMETRICS  Vitals Temp: 97.6 F (36.4 C) BP: 100/71 Pulse Rate: 96 SpO2: 98 %   Anthropometric Measurements Height: 5\' 6"  (1.676 m) Weight: 185 lb (83.9 kg) BMI (Calculated): 29.87 Weight at Last Visit: 189 lb Weight Lost Since Last Visit: 4 lb Weight Gained Since Last Visit: 0 Starting Weight: 199 lb   Body Composition  Body Fat %: 44.4 % Fat Mass (lbs): 82.2 lbs Muscle Mass (lbs): 97.6 lbs Total Body Water (lbs): 69.8 lbs Visceral Fat Rating : 12   Other Clinical Data Fasting: no Labs: No Today's Visit #: 5 Starting Date: 12/11/22     HPI  Chief Complaint: OBESITY  Molly Giles is here to discuss her progress with her obesity treatment plan. She is on the the Category 1 Plan and states she is following her eating plan approximately 95 % of the time. She states she is exercising/walking/PT 45-60 minutes 3-4 times per week.   Interval History:  Since last office visit she is down 4 lbs.  Hunger/appetite-well controlled Good adherence to nutrition plan.  Cravings- not excessive Stress- manageable Exercise-walking 3-4 times weekly and going to PT for right hip pain . Discussed starting some upper body strengthening exercises.  Hydration-Adequate.   Goals over next 2 weeks: 1) work on upper body strengthening exercises 2-3 times weekly          2) work on getting 3 fiber sources daily- vegetables or fruits and     Provided smart fruit hand out.   Pharmacotherapy: metformin started for insulin resistance- but stopped due to rash. No GI side effects with metformin. Will monitor off metformin for now.   TREATMENT PLAN FOR OBESITY:  Recommended Dietary Goals  Molly Giles is currently in the action stage of change. As such, her goal is to continue weight management plan. She has agreed to the Category 1 Plan.  Behavioral Intervention  We discussed the following Behavioral Modification Strategies  today: increasing lean protein intake, decreasing simple carbohydrates , increasing vegetables, increasing lower glycemic fruits, increasing fiber rich foods, increasing water intake, continue to practice mindfulness when eating, and planning for success.  Additional resources provided today: Smart fruits and Breakfast and Lunch options provided today.   Recommended Physical Activity Goals  Molly Giles has been advised to work up to 150 minutes of moderate intensity aerobic activity a week and strengthening exercises 2-3 times per week for cardiovascular health, weight loss maintenance and preservation of muscle mass.   She has agreed to Continue current level of physical activity  and Start strengthening exercises with a goal of 2-3 sessions a week    Pharmacotherapy We discussed various medication options to help Molly Giles with her weight loss efforts and we both agreed to hold metformin for now as developed truncal rash, ? Allergy to metformin.    Return in about 2 weeks (around 02/21/2023).Marland Kitchen She was informed of the importance of frequent follow up visits to maximize her success with intensive lifestyle modifications for her multiple health conditions.  PHYSICAL EXAM:  Blood pressure 100/71, pulse 96, temperature 97.6 F (36.4 C), height 5\' 6"  (1.676 m), weight 185 lb (83.9 kg), SpO2 98 %. Body mass index is 29.86 kg/m.  General: She is overweight, cooperative, alert, well developed, and in no acute distress. Skin: punctate red slightly inflamed rash over back and abdomen. Patient reports significant itching with rash which began after starting metformin.  PSYCH: Has normal mood, affect and thought  process.   Cardiovascular: HR 90's regular Lungs: Normal breathing effort, no conversational dyspnea. Neuro: no focal deficits  DIAGNOSTIC DATA REVIEWED:  BMET    Component Value Date/Time   NA 138 12/11/2022 1105   K 4.0 12/11/2022 1105   CL 103 12/11/2022 1105   CO2 19 (L) 12/11/2022 1105    GLUCOSE 84 12/11/2022 1105   GLUCOSE 93 07/05/2019 1629   BUN 16 12/11/2022 1105   CREATININE 0.88 12/11/2022 1105   CREATININE 0.74 02/24/2016 1158   CALCIUM 9.4 12/11/2022 1105   GFRNONAA 65 07/24/2019 1116   GFRNONAA 88 02/24/2016 1158   GFRAA 75 07/24/2019 1116   GFRAA >89 02/24/2016 1158   Lab Results  Component Value Date   HGBA1C 5.3 12/11/2022   HGBA1C 5.3 03/22/2017   Lab Results  Component Value Date   INSULIN 7.8 12/11/2022   INSULIN 10.5 03/22/2017   Lab Results  Component Value Date   TSH 4.960 (H) 12/11/2022   CBC    Component Value Date/Time   WBC 6.4 08/04/2020 0000   WBC 6.6 07/05/2019 1629   RBC 4.26 08/04/2020 0000   HGB 13.5 08/04/2020 0000   HGB 12.6 05/07/2018 1057   HCT 41 08/04/2020 0000   HCT 38.8 05/07/2018 1057   PLT 262 08/04/2020 0000   MCV 95.0 07/05/2019 1629   MCV 90 05/07/2018 1057   MCH 30.0 07/05/2019 1629   MCHC 31.6 07/05/2019 1629   RDW 13.8 07/05/2019 1629   RDW 14.0 05/07/2018 1057   Iron Studies No results found for: "IRON", "TIBC", "FERRITIN", "IRONPCTSAT" Lipid Panel     Component Value Date/Time   CHOL 176 12/11/2022 1105   TRIG 117 12/11/2022 1105   HDL 54 12/11/2022 1105   CHOLHDL 3.3 10/11/2021 1142   CHOLHDL 3.9 12/14/2014 1036   VLDL 26 12/14/2014 1036   LDLCALC 101 (H) 12/11/2022 1105   Hepatic Function Panel     Component Value Date/Time   PROT 6.5 12/11/2022 1105   ALBUMIN 4.5 12/11/2022 1105   AST 19 12/11/2022 1105   ALT 20 12/11/2022 1105   ALKPHOS 81 12/11/2022 1105   BILITOT 0.3 12/11/2022 1105   BILIDIR 0.14 02/12/2020 1056      Component Value Date/Time   TSH 4.960 (H) 12/11/2022 1105   Nutritional Lab Results  Component Value Date   VD25OH 41.2 12/11/2022   VD25OH 43.3 05/07/2018   VD25OH 48.5 07/04/2017    ASSOCIATED CONDITIONS ADDRESSED TODAY  ASSESSMENT AND PLAN  Problem List Items Addressed This Visit     Insulin resistance - Primary   Generalized obesity   Rash    BMI 29.0-29.9,adult Current BMI 29.9   Insulin Resistance Last fasting insulin was 7.8- not at goal. A1c was 5.3- at goal. Polyphagia:No Medication(s):  metformin 250 mg daily- No GI side effects, but stopped as developed a truncal rash. Will monitor off metformin.  She is working on Engineer, technical sales to decrease simple carbohydrates, increase lean proteins and exercise to promote weight loss, improve glycemic control and prevent progression to Type 2 diabetes.   Lab Results  Component Value Date   HGBA1C 5.3 12/11/2022   HGBA1C 5.2 06/26/2019   HGBA1C 5.0 02/07/2018   HGBA1C 5.2 07/04/2017   HGBA1C 5.3 03/22/2017   Lab Results  Component Value Date   INSULIN 7.8 12/11/2022   INSULIN 8.3 05/07/2018   INSULIN 11.8 07/04/2017   INSULIN 10.5 03/22/2017    Plan: Discontinue  metformin for now and monitor.  Continue  working on nutrition plan to decrease simple carbohydrates, increase lean proteins and exercise to promote weight loss, improve glycemic control and prevent progression to Type 2 diabetes.   Rash:  She developed itchy, slightly raised, red inflamed truncal rash after starting metformin. No other new medications or products. She did travel to [facility] for [place] trip, but rash began prior to the trip.  Plan: She is taking Zrytec for itching and using HC cream and can continue with both of these until rash resolved. Will monitor off metformin. ? Metformin related allergy or other etiology.  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   ATTESTASTION STATEMENTS:  Reviewed by clinician on day of visit: allergies, medications, problem list, medical history, surgical history, family history, social history, and previous encounter notes.   I have personally spent 42 minutes total time today in preparation, patient care, nutritional counseling and documentation for this visit, including the following: review of clinical lab tests; review of medical  tests/procedures/services.      Temesha Queener, PA-C

## 2023-02-09 ENCOUNTER — Other Ambulatory Visit (HOSPITAL_COMMUNITY): Payer: Self-pay | Admitting: Psychiatry

## 2023-02-09 ENCOUNTER — Ambulatory Visit: Payer: Medicare Other | Admitting: Physical Therapy

## 2023-02-09 DIAGNOSIS — M6281 Muscle weakness (generalized): Secondary | ICD-10-CM

## 2023-02-09 DIAGNOSIS — M25551 Pain in right hip: Secondary | ICD-10-CM | POA: Diagnosis not present

## 2023-02-09 DIAGNOSIS — M25651 Stiffness of right hip, not elsewhere classified: Secondary | ICD-10-CM

## 2023-02-09 NOTE — Therapy (Signed)
OUTPATIENT PHYSICAL THERAPY TREATMENT   Patient Name: Molly Giles MRN: [id number] DOB:1956/06/22, 67 y.o., female Today's Date: 02/01/2023  END OF SESSION:  PT End of Session - 02/01/23 1807     Visit Number 6    Date for PT Re-Evaluation 02/26/23    Authorization Type Medicare A and B    Progress Note Due on Visit 10    PT Start Time 1453    PT Stop Time 1531    PT Time Calculation (min) 38 min    Activity Tolerance Patient tolerated treatment well    Behavior During Therapy [facility] for tasks assessed/performed              Past Medical History:  Diagnosis Date   Abdominal bloating 05/10/2021   Abdominal discomfort, generalized 05/07/2016   Acute gastritis 11/19/2020   Allergic rhinoconjunctivitis 07/02/2015   Anal fissure 11/19/2020   Anterior to posterior tear of superior glenoid labrum of left shoulder 02/22/2021   Arthritis    Benign positional vertigo 04/2015   Responded well to [facility]   Bruxism (teeth grinding)    Chronic contact dermatitis 08/06/2018   Per allergist Dr Edwyna Ready.    Chronic migraine 02/25/2016   Per Dr Lucia Gaskins review in notes from [facility] from September 2015. Showed total headache days last month 18. Severe headache days 7 days. Moderate headache days 5 days. Mild headache days last month sick days. Days without headache last month 10 days. Symptoms associated with photophobia, phonophobia, osmophobia, neck pain, dizziness, jaw pain, nasal congestion, vision disturbances, tingling and numbness, weakness and worsening with activity. Each headache attack last 3 hours depending on treatment in severity. Left side, the right side, easier side, the frontal area in the back of the head. Characterized as throbbing, pressure, tightness, squeezing, stabbing and burning    Chronic migraine without aura 05/15/2013    Dr Lucia Gaskins [facility]   Chronic tonsillitis 01/27/2019   DDD (degenerative disc disease),  cervical 11/18/2018   DDD (degenerative disc disease), lumbar    Depression    Disc displacement, lumbar    Dysphagia 10/11/2021   Dysrhythmia    seen by dr Donnie Aho- not a problem since she has been on Bystolic   Episodic cluster headache, not intractable 03/06/2017   Essential hypertension 05/15/2013   Family history of adverse reaction to anesthesia    Brother- N/V   Family history of premature CAD 05/15/2013   Fibromyalgia syndrome 07/01/2015   Management by Dr Kathie Rhodes. Devashwar (Rheum)    GERD (gastroesophageal reflux disease) 12/16/2014   H/O seasonal allergies    Hallux rigidus of right foot 09/25/2022   Hammer toe    Left great toe   Hashimoto's thyroiditis    Per patient, diagnosed by Dr. Ferdinand Cava   Hearing loss of both ears 07/27/2015   mild to borderline moderate low frequency hearing loss improving to within normal limits bilaterally on audiology testing at [facility] in November 2016.     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   Hyperlipidemia 1998   Hypokalemia due to excessive gastrointestinal loss of potassium 07/28/2019   Hypothyroidism    Impairment of balance 02/2015  Consequent of postconcussive syndrome   Injury of triangular fibrocartilage complex of left wrist 02/25/2019   Dx 02/25/19 Bradly Bienenstock IV MD ([facility])   Insulin resistance 07/04/2017   Internal hemorrhoid 01/28/2021   Internal hemorrhoid seen on colonoscopy 10/2020 Roderic Scarce MD [facility])   Interstitial cystitis    Irritable bowel syndrome with diarrhea 04/03/2016   Left ventricular hypertrophy, mild 02/25/2016   ECHOcardiogram report 06/17/15 showing EF55-60%, mild LVH and G1DD     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HCC) 03/10/2018   [facility]   Lumbar facet joint pain    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   Mood disorder (HCC)    Morbid obesity (HCC) 03/06/2017   Musculoskeletal neck pain 07/14/2015   Nocturnal hypoxemia 03/06/2017   Normal coronary arteries 05/14/2014   Obesity (BMI 30.0-34.9) 01/29/2017   Odynophagia 12/20/2021   Osteoarthritis of left hip 11/01/2015   MRI order by Dr Charlann Boxer (ortho) 10/2015 showed significant arthritis of left hip joint with cystic changes in femoral head c/w osteoarthritis   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, lumbar region 10/30/2011   Other insomnia 11/09/2016   Overweight    Pain in joint of left shoulder 11/09/2016   Pain in joint, multiple sites 11/18/2018   Palpitations 05/15/2013   [facility] manages   Perianal candidiasis 12/20/2021   Periodic limb movement sleep disorder 03/28/2017   Perirectal cyst 05/07/2016   Poison ivy dermatitis 03/24/2021   PONV (postoperative nausea and vomiting)    Positive ANA (antinuclear antibody) 11/18/2018   Post concussion syndrome 06/06/2015   Post concussive syndrome 07/14/2015   Ms Redner's post-concussive syndrome manifesting in vertigo and headache, mood changes, poor balance, dizziness, and decreased concentration per Dr Lucia Gaskins at [facility].    Posterior vitreous detachment of right eye 2014   Premature menopause 12/20/2021   Rectal abscess 05/10/2021   S/P left THA, AA 07/25/2016   Sacroiliac inflammation (HCC) 08/18/2021   Shingles    Shortness of breath dyspnea    with exertion   Sicca syndrome (HCC) 11/18/2018   (+) ANA   Sleep walking and eating 03/06/2017   Snoring 03/06/2017   Spondylosis of lumbar region without myelopathy or radiculopathy 10/30/2011   TFC (triangular fibrocartilage complex) injury 02/25/2019   Thyroid nodule 08/11/2009   Findings: The thyroid gland is within normal limits in size.  The gland is  diffusely inhomogeneous. A small solid nodule is noted in the lower pole  medially on the right of 7 x 6 x 8 mm. A small solid nodule is noted inferiorly on the left of 3 x 3 x 4 mm.  IMPRESSION:  The thyroid gland is within normal limits in size with only small solid nodules present, the largest of only 8 mm in diameter on the right.     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    Trochanteric bursitis, right hip 04/26/2020   Tubular adenoma of colon 01/28/2021   Colonoscopy screening 4 mm tubular Adenoma polyp Charlott Rakes, MD [facility])   Tubular adenoma of colon 01/28/2021   Colonoscopy screening 4 mm tubular Adenoma polyp Charlott Rakes, MD [facility])   Vasomotor symptoms due to menopause 04/19/2017   Vitamin D deficiency 05/08/2017   Vulvitis 07/22/2020   Yeast vaginitis 07/11/2019   Past Surgical History:  Procedure Laterality Date   arthroscopy Left 01/2022   with SAD and DCR, K. Supple MD   Bladder dilitation  x 3   BREAST BIOPSY Right 2011   Benign histology   CARDIOVASCULAR STRESS TEST  2000   Unremarkable per pt report   CARPOMETACARPAL JOINT ARTHROTOMY Right 2011   COLONOSCOPY     COLONOSCOPY WITH PROPOFOL N/A 04/21/2015   Procedure: COLONOSCOPY WITH PROPOFOL;  Surgeon: Jeani Hawking, MD;  Location: [facility] ENDOSCOPY;  Service: Endoscopy;  Laterality: N/A;   CYSTOSCOPY W/ DILATION OF BLADDER N/A    EPIDURAL BLOCK INJECTION Left 04/12/2016   Left Medial Nerve Block and Left L5 ramus block, Dr Sheran Luz    EPIDURAL BLOCK INJECTION  03/21/2016   Left L3-4 medial branch block and Left L5 & dorsal ramus block    EPIDURAL BLOCK INJECTION N/A 10/25/2016   Sheran Luz, MD. Lumbar medial branch block   EPIDURAL BLOCK INJECTION N/A 02/09/2017   Sheran Luz, MD.  Bilateral L3/4 medial branch block, bilateral L5 dorsal ramus block   EPIDURAL BLOCK INJECTION N/A 07/04/2017   Sheran Luz, MD   FECAL  TRANSPLANT  04/21/2015   Procedure: FECAL TRANSPLANT;  Surgeon: Jeani Hawking, MD;  Location: [facility] ENDOSCOPY;  Service: Endoscopy;;   HIP ARTHROPLASTY Left    HIP ARTHROSCOPY Left 03/06/2018   Left hip arthroplasty, redo for loose hip arthroplasty. Procedure at [facility] hospital   INJECTION HIP INTRA ARTICULAR Left 11/2015   for OA by Dr Vilma Prader IMPLANT PLACEMENT  04/2021   Floyde Parkins MD ([facility])   INTERSTIM IMPLANT REVISION N/A 09/2021   Floyde Parkins MD ([facility])   OTHER SURGICAL HISTORY Left 2016   Left L3/L4 medial nerve block and Left L5 Dorsal Ramus block Dr Rosine Abe   TOTAL HIP ARTHROPLASTY Left 07/25/2016   Procedure: LEFT TOTAL HIP ARTHROPLASTY ANTERIOR APPROACH;  Surgeon: Durene Romans, MD;  Location: [facility];  Service: Orthopedics;  Laterality: Left;   Patient Active Problem List   Diagnosis Date Noted   Low serum vitamin B12 01/10/2023   OSA (obstructive sleep apnea) 12/27/2022   BMI 30.0-30.9,adult 12/27/2022   Severe obstructive sleep apnea-hypopnea syndrome 12/22/2022   Hyperglycemia 12/11/2022   Depression screening 12/11/2022   Generalized obesity 12/11/2022   SOBOE (shortness of breath on exertion) 12/11/2022   Other fatigue 12/11/2022   Sleep related headaches 11/07/2022   Intractable episodic cluster headache 11/07/2022   Sleep walking and eating 11/07/2022   Ambien use disorder, 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   Psychophysiological insomnia 11/07/2022   Rhinitis 11/03/2022   Hallux rigidus of right foot 09/25/2022   Morton neuroma, right 09/25/2022   Iliopsoas bursitis of left hip 05/08/2022   Chronic pain of right knee 05/08/2022   Arthrosis of hand 12/20/2021   History of Clostridioides difficile colitis, required fecal transplantation 12/20/2021   Mixed hyperlipidemia 12/20/2021   Meibomian gland dysfunction (MGD) of both eyes 10/17/2021   Nuclear sclerosis of both eyes 10/17/2021   Epiretinal membrane (ERM) of left eye  10/17/2021   OAB (overactive bladder) 03/02/2021   Osteoarthritis of acromioclavicular joint 02/22/2021   Hyperhidrosis, scalp, 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   Chronic low back pain 05/14/2019   Cervical spondylosis 11/18/2018   DDD (degenerative disc disease), lumbar 11/18/2018   Keratoconjunctivitis sicca (HCC) 11/18/2018   Chronic contact dermatitis 08/06/2018   Primary osteoarthritis of left knee 06/20/2018   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HCC) 11/29/2017   Insulin resistance 07/04/2017   Vitamin D deficiency 05/08/2017   Periodic limb movement sleep disorder 03/28/2017   Obesity (BMI 30.0-34.9) 01/29/2017   Other insomnia  11/09/2016   Irritable bowel syndrome with diarrhea 04/03/2016   Left ventricular hypertrophy, mild 02/25/2016   Mood disorder (HCC) 07/02/2015   Allergic rhinoconjunctivitis 07/02/2015   Fibromyalgia syndrome 07/01/2015   Personal history of colonic polyps 07/01/2015   GERD (gastroesophageal reflux disease) 12/16/2014   Hypothyroidism 05/15/2013   Pure hypercholesterolemia 05/15/2013   Chronic interstitial cystitis 05/15/2013   Chronic migraine without aura 05/15/2013    PCP: McDiarmid, Leighton Roach, MD   REFERRING PROVIDER: Molly Maduro, MD   REFERRING DIAG: M25.551 Pain in right hip  THERAPY DIAG:  Pain in right hip  Stiffness of right hip, not elsewhere classified  Muscle weakness (generalized)  Difficulty in walking, not elsewhere classified  Cramp and spasm  Other muscle spasm  Rationale for Evaluation and Treatment: Rehabilitation  ONSET DATE: 12/11/22  SUBJECTIVE:   SUBJECTIVE STATEMENT: For the past week I've been able to do steps reciprocally rather than one at a time!  Yesterday I did have an issue with walking up an incline.    PERTINENT HISTORY: Left hip THA PAIN:  Are you having pain?  2/10 Rt posterior- lateral hip, aggravated when hip is tired at the end of the day Pain is better at the start of the day.    PRECAUTIONS:  None  WEIGHT BEARING RESTRICTIONS: No  FALLS:  Has patient fallen in last 6 months? No  LIVING ENVIRONMENT: Lives with: lives with their spouse Lives in: House/apartment   OCCUPATION: Retired, she has sleep apnea and tends to sleep in, gets up to do housework, goes out with friends.  She was walking her dog but unable currently due to the pain.   PLOF: Independent, Independent with basic ADLs, Independent with household mobility without device, Independent with community mobility without device, Independent with homemaking with ambulation, Independent with gait, and Independent with transfers  PATIENT GOALS: To get out of pain and be able to walk and be more active  NEXT MD VISIT: prn  OBJECTIVE:   DIAGNOSTIC FINDINGS:  Impression: Ultrasound Undersurface fraying and probable low-grade partial tearing of the  distal gluteus minimus tendon at the greater trochanter with  moderate subgluteus bursitis.  PATIENT SURVEYS:  LEFS: complete next visit  COGNITION: Overall cognitive status: Within functional limits for tasks assessed     SENSATION: WFL   MUSCLE LENGTH: Hamstrings: Right 50 deg; Left 60 deg Thomas test: Right Positive; Left Positive  POSTURE: No Significant postural limitations  PALPATION: Tender over lateral right hip at greater trochanter  LOWER EXTREMITY ROM:  WFL  LOWER EXTREMITY MMT:  MMT Right eval Left eval  Hip flexion 4+ 4+  Hip extension 3+ 4  Hip abduction 3+ 3+  Hip adduction 5 5  Hip internal rotation 4- 4-  Hip external rotation 4- 4-   LOWER EXTREMITY SPECIAL TESTS:  Hip special tests: Luisa Hart (FABER) test: positive , Trendelenburg test: positive , Thomas test: positive , and Ober's test: positive   FUNCTIONAL TESTS:  5 times sit to stand: 11.67 sec Timed up and go (TUG): 8.57 sec  GAIT: Distance walked: 30 Assistive device utilized:  oc and None Level of assistance: Complete Independence Comments:  antalgic/trendelenburg   TODAY'S TREATMENT:     DATE: 5/31 Nustep x 5 min level 6 -PT present to discuss progress  Squats in front of 20 inch box holding 10# KB goblet style 3 sets of 5 Dead lifts with 10# KB 6 inches from floor 3 sets of 5 Side to wall knee dynamic isometric hip abduction  towel slides on wall forward and back:  right side slides 15x, left side slides fatigues at 5 reps secondary to fatigue with WB on right Purple X-heavy power cord forward hip thrusts against resistance 15x Purple X-heavy power cord around right hip/groin with left lateral lunge 8x Patient demonstrates up and down 4 steps x2 reciprocally Hip matrix 40# x 10 each on each LE hip abduction and hip extension (verbal cues needed for proper hip position on abduction)      DATE: 02/01/23 Nustep x 5 min level 6 -PT present to discuss progress  Lateral band walks with blue loop x 10 steps each way, 3 laps Standing hip abduction in slight squat position with blue loop x 20 Cone touches 3 x 10 each LE (patient had very easy time with cone on counter, switched to cone on seat of lat pull down) Single leg dead lifts 10# kettlebell to touch tall cone 2 sets of 10  Squats x 10 to chair Hip matrix x 20 each on each LE hip abduction and hip extension (verbal cues needed for proper hip position on abduction) Hook lying clam with blue loop x 20 Side lying clam with blue loop x 20 each side Side lying hip IR with blue loop x 10 each side Reviewed technique on side lying hip abduction for HEP  DATE: 01/23/23 Nustep x 6 min level 6 -PT present to discuss progress  Lateral band walks with green loop x 10 steps each way, 3 laps Dead lifts 10# kettlebell to touch tall cone 2 sets of 10 Squats x 10 Step up and hold fwd and lateral x 10 each LE in each direction Standing in front of the mirror in staggered stance position on balance pad x 2 min each leg fwd Squats to 20 inch box holding 5# kettlebell 2 sets of 5                                                                                                                      PATIENT EDUCATION:  Education details: Initiated HEP and educated on leg length discrepency vs muscle imbalance Person educated: Patient Education method: Programmer, multimedia, Facilities manager, Verbal cues, and Handouts Education comprehension: verbalized understanding, returned demonstration, and verbal cues required  HOME EXERCISE PROGRAM: Access Code: [email or web address] URL: [email or web address] Date: 01/16/2023 Prepared by: Lavinia Sharps  Exercises - Sidelying Hip Abduction  - 1 x daily - 7 x weekly - 2 sets - 10 reps - Clam with Resistance  - 1 x daily - 7 x weekly - 2 sets - 10 reps - Sidelying Reverse Clamshell with Resistance  - 1 x daily - 7 x weekly - 2 sets - 10 reps - Prone Hip Extension  - 1 x daily - 7 x weekly - 2 sets - 10 reps - Standing Hamstring Stretch on Chair  - 1 x daily - 7 x weekly - 1 sets - 3 reps - 30 sec hold - Standing Hip Extension with Counter Support  -  2 x daily - 7 x weekly - 2 sets - 10 reps - Standing Hip Abduction with Anterior Support  - 2 x daily - 7 x weekly - 2 sets - 10 reps - Sit to Stand Without Arm Support  - 2 x daily - 7 x weekly - 2 sets - 10 reps - Staggered Stance Step Throughs (Mirrored)  - 1 x daily - 7 x weekly - 2 sets - 5 reps - Squat with Chair Touch  - 1 x daily - 7 x weekly - 2 sets - 5 reps - Half Deadlift with Kettlebell  - 1 x daily - 7 x weekly - 2 sets - 5 reps   ASSESSMENT:  CLINICAL IMPRESSION: Verbal cues with dead lifts and squats to optimize technique including wider stance and more hip hinge than knee flexion with dead lifts.  Wall hip abduction dynamic isometrics were the most challenging ex for her with quick muscular fatigue in gluteals with left wall slides while standing in right.  Good improvements functionally with patient now able go up and down steps reciprocally.    OBJECTIVE IMPAIRMENTS: Abnormal gait,  difficulty walking, decreased ROM, decreased strength, increased fascial restrictions, increased muscle spasms, impaired flexibility, and pain.   ACTIVITY LIMITATIONS: bending, sitting, squatting, stairs, transfers, bed mobility, and dressing  PARTICIPATION LIMITATIONS: meal prep, cleaning, laundry, driving, shopping, community activity, and yard work  PERSONAL FACTORS: Age, Fitness, and Past/current experiences are also affecting patient's functional outcome.   REHAB POTENTIAL: Good  CLINICAL DECISION MAKING: Stable/uncomplicated  EVALUATION COMPLEXITY: Low   GOALS: Goals reviewed with patient? Yes  SHORT TERM GOALS: Target date: 01/29/2023  Pain report to be no greater than 4/10  Baseline: Goal status: MET  2.  Patient will be independent with initial HEP  Baseline:  Goal status: MET   LONG TERM GOALS: Target date: 02/26/2023  Patient to report pain no greater than 2/10  Baseline:  Goal status: IN PROGRESS  2.  Patient to be independent with advanced HEP  Baseline:  Goal status: IN PROGRESS  3.  Patient to be able to sleep through the night  Baseline:  Goal status: IN PROGRESS  4.  Patient to be able to stand or walk for at least 15 min without right hip pain Baseline:  Goal status: INITIAL  5.  Functional scores to improve by 1-3 seconds Baseline:  Goal status: INITIAL  6.  Patient to report 85% improvement in overall symptoms  Baseline:  Goal status: INITIAL   PLAN:  PT FREQUENCY: 1-2x/week  PT DURATION: 8 weeks  PLANNED INTERVENTIONS: Therapeutic exercises, Therapeutic activity, Neuromuscular re-education, Balance training, Gait training, Patient/Family education, Self Care, Joint mobilization, Stair training, DME instructions, Aquatic Therapy, Dry Needling, Electrical stimulation, Spinal mobilization, Cryotherapy, Moist heat, Taping, Traction, Ultrasound, Ionotophoresis 4mg /ml Dexamethasone, Manual therapy, and Re-evaluation  PLAN FOR NEXT SESSION:  ;  Nustep, review new HEP, focus on bilateral hip stability training and core strength.  Mentioned DN but defers at this time (had in the past)   Lavinia Sharps, PT 02/09/23 11:16 AM Phone: [phone] Fax: [phone]  [facility] [address], [address] [place], [place] [address] Phone # [phone] Fax [phone]

## 2023-02-13 ENCOUNTER — Other Ambulatory Visit: Payer: Self-pay | Admitting: Family Medicine

## 2023-02-13 ENCOUNTER — Ambulatory Visit: Payer: Medicare Other | Attending: Adult Reconstructive Orthopaedic Surgery

## 2023-02-13 ENCOUNTER — Encounter (HOSPITAL_COMMUNITY): Payer: Self-pay | Admitting: Psychiatry

## 2023-02-13 ENCOUNTER — Ambulatory Visit (HOSPITAL_BASED_OUTPATIENT_CLINIC_OR_DEPARTMENT_OTHER): Payer: Medicare Other | Admitting: Psychiatry

## 2023-02-13 ENCOUNTER — Other Ambulatory Visit: Payer: Self-pay

## 2023-02-13 VITALS — BP 119/86 | HR 94 | Ht 66.0 in | Wt 185.0 lb

## 2023-02-13 DIAGNOSIS — R262 Difficulty in walking, not elsewhere classified: Secondary | ICD-10-CM | POA: Insufficient documentation

## 2023-02-13 DIAGNOSIS — M6281 Muscle weakness (generalized): Secondary | ICD-10-CM | POA: Insufficient documentation

## 2023-02-13 DIAGNOSIS — F5105 Insomnia due to other mental disorder: Secondary | ICD-10-CM | POA: Diagnosis not present

## 2023-02-13 DIAGNOSIS — M62838 Other muscle spasm: Secondary | ICD-10-CM | POA: Diagnosis present

## 2023-02-13 DIAGNOSIS — M25651 Stiffness of right hip, not elsewhere classified: Secondary | ICD-10-CM | POA: Diagnosis present

## 2023-02-13 DIAGNOSIS — M25551 Pain in right hip: Secondary | ICD-10-CM | POA: Diagnosis present

## 2023-02-13 DIAGNOSIS — R252 Cramp and spasm: Secondary | ICD-10-CM | POA: Insufficient documentation

## 2023-02-13 MED ORDER — ZOLPIDEM TARTRATE 5 MG PO TABS: 5.0000 mg | ORAL_TABLET | Freq: Every evening | ORAL | 5 refills | Status: DC | PRN

## 2023-02-13 NOTE — Progress Notes (Signed)
Psychiatric Initial Adult Assessment   Patient Identification: Molly Giles MRN:  [id number] Date of Evaluation:  02/13/2023 Referral Source:  Chief Complaint:   Visit Diagnosis: Adjustment disorder  History of Present Illness: Dr. Awilda Bill    Today the patient is doing better.  Interpersonal issues with her daughter.  Anxiety has dropped off.  The patient did have a sleep study and was determined to have sleep apnea.  She tended to wear a mask that was not successful but she is going to have it adjusted by the company and in the next few weeks.  On the other hand she takes 5 mg of Ambien and gets a good night's sleep.  At first we are attempting to change the Ambien to Belsomra.  She has some issues through [employer].  Apparently got lost and that she needed a prior authorization.  She takes Ambien 5 mg and sleeps very well.  She has no falls.  She has no next-day confusion or sedation.  She does very well taking the Ambien.  The patient also herself has discontinued her Ativan.  She no longer needs it.  She is getting along much better with Seroquel and her daughter-in-law.  Patient is doing quite well.  She is functioning well.  Hopefully she will get the maximum readjusted and be able to tolerate his CPAP machine.  Diagnosis is primary insomnia. Associated Signs/Symptoms: Depression Symptoms:  anxiety, (Hypo) Manic Symptoms:   Anxiety Symptoms:  Excessive Worry, Psychotic Symptoms:  PTSD Symptoms: NA  Past Psychiatric History:   Previous Psychotropic Medications: Yes   Substance Abuse History in the last 12 months:  No.  Consequences of Substance Abuse: NA  Past Medical History:  Past Medical History:  Diagnosis Date   Abdominal bloating 05/10/2021   Abdominal discomfort, generalized 05/07/2016   Acute gastritis 11/19/2020   Allergic rhinoconjunctivitis 07/02/2015   Anal fissure 11/19/2020   Anterior to posterior tear of superior glenoid labrum of left  shoulder 02/22/2021   Arthritis    Benign positional vertigo 04/2015   Responded well to [facility]   Bruxism (teeth grinding)    Chronic contact dermatitis 08/06/2018   Per allergist Dr Edwyna Ready.    Chronic migraine 02/25/2016   Per Dr Lucia Gaskins review in notes from [facility] from September 2015. Showed total headache days last month 18. Severe headache days 7 days. Moderate headache days 5 days. Mild headache days last month sick days. Days without headache last month 10 days. Symptoms associated with photophobia, phonophobia, osmophobia, neck pain, dizziness, jaw pain, nasal congestion, vision disturbances, tingling and numbness, weakness and worsening with activity. Each headache attack last 3 hours depending on treatment in severity. Left side, the right side, easier side, the frontal area in the back of the head. Characterized as throbbing, pressure, tightness, squeezing, stabbing and burning    Chronic migraine without aura 05/15/2013    Dr Lucia Gaskins [facility]   Chronic tonsillitis 01/27/2019   DDD (degenerative disc disease), cervical 11/18/2018   DDD (degenerative disc disease), lumbar    Depression    Disc displacement, lumbar    Dysphagia 10/11/2021   Dysrhythmia    seen by dr Donnie Aho- not a problem since she has been on Bystolic   Episodic cluster headache, not intractable 03/06/2017   Essential hypertension 05/15/2013   Family history of adverse reaction to anesthesia    Brother- N/V   Family history of premature CAD 05/15/2013   Fibromyalgia syndrome 07/01/2015  Management by Dr Kathie Rhodes. Devashwar (Rheum)    GERD (gastroesophageal reflux disease) 12/16/2014   H/O seasonal allergies    Hallux rigidus of right foot 09/25/2022   Hammer toe    Left great toe   Hashimoto's thyroiditis    Per patient, diagnosed by Dr. Ferdinand Cava   Hearing loss of both ears 07/27/2015   mild to borderline moderate low frequency hearing loss improving to  within normal limits bilaterally on audiology testing at [facility] in November 2016.     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   Hyperlipidemia 1998   Hypokalemia due to excessive gastrointestinal loss of potassium 07/28/2019   Hypothyroidism    Impairment of balance 02/2015   Consequent of postconcussive syndrome   Injury of triangular fibrocartilage complex of left wrist 02/25/2019   Dx 02/25/19 Bradly Bienenstock IV MD ([facility])   Insulin resistance 07/04/2017   Internal hemorrhoid 01/28/2021   Internal hemorrhoid seen on colonoscopy 10/2020 Roderic Scarce MD [facility])   Interstitial cystitis    Irritable bowel syndrome with diarrhea 04/03/2016   Left ventricular hypertrophy, mild 02/25/2016   ECHOcardiogram report 06/17/15 showing EF55-60%, mild LVH and G1DD    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HCC) 03/10/2018   [facility]   Lumbar facet joint pain    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   Mood disorder (HCC)    Morbid obesity (HCC) 03/06/2017   Musculoskeletal neck pain 07/14/2015   Nocturnal hypoxemia 03/06/2017   Normal coronary arteries 05/14/2014   Obesity (BMI 30.0-34.9) 01/29/2017   Odynophagia 12/20/2021   Osteoarthritis of left hip 11/01/2015   MRI order by Dr Charlann Boxer (ortho) 10/2015 showed significant arthritis of left hip joint with cystic changes in femoral head c/w osteoarthritis   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, lumbar region 10/30/2011   Other insomnia 11/09/2016   Overweight    Pain in joint of left shoulder 11/09/2016   Pain in joint, multiple sites 11/18/2018   Palpitations 05/15/2013   [facility] manages   Perianal  candidiasis 12/20/2021   Periodic limb movement sleep disorder 03/28/2017   Perirectal cyst 05/07/2016   Poison ivy dermatitis 03/24/2021   PONV (postoperative nausea and vomiting)    Positive ANA (antinuclear antibody) 11/18/2018   Post concussion syndrome 06/06/2015   Post concussive syndrome 07/14/2015   Ms Debroux's post-concussive syndrome manifesting in vertigo and headache, mood changes, poor balance, dizziness, and decreased concentration per Dr Lucia Gaskins at [facility].    Posterior vitreous detachment of right eye 2014   Premature menopause 12/20/2021   Rectal abscess 05/10/2021   S/P left THA, AA 07/25/2016   Sacroiliac inflammation (HCC) 08/18/2021   Shingles    Shortness of breath dyspnea    with exertion   Sicca syndrome (HCC) 11/18/2018   (+) ANA   Sleep walking and eating 03/06/2017   Snoring 03/06/2017   Spondylosis of lumbar region without myelopathy or radiculopathy 10/30/2011   TFC (triangular fibrocartilage complex) injury 02/25/2019   Thyroid nodule 08/11/2009   Findings: The thyroid gland is within normal limits in size.  The gland is diffusely inhomogeneous. A small solid nodule is noted in the lower pole  medially on the right  of 7 x 6 x 8 mm. A small solid nodule is noted inferiorly on the left of 3 x 3 x 4 mm.  IMPRESSION:  The thyroid gland is within normal limits in size with only small solid nodules present, the largest of only 8 mm in diameter on the right.     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    Trochanteric bursitis, right hip 04/26/2020   Tubular adenoma of colon 01/28/2021   Colonoscopy screening 4 mm tubular Adenoma polyp Charlott Rakes, MD [facility])   Tubular adenoma of colon 01/28/2021   Colonoscopy screening 4 mm tubular Adenoma polyp Charlott Rakes, MD [facility])   Vasomotor symptoms due to menopause 04/19/2017   Vitamin D deficiency 05/08/2017   Vulvitis 07/22/2020    Yeast vaginitis 07/11/2019    Past Surgical History:  Procedure Laterality Date   arthroscopy Left 01/2022   with SAD and DCR, K. Supple MD   Bladder dilitation     x 3   BREAST BIOPSY Right 2011   Benign histology   CARDIOVASCULAR STRESS TEST  2000   Unremarkable per pt report   CARPOMETACARPAL JOINT ARTHROTOMY Right 2011   COLONOSCOPY     COLONOSCOPY WITH PROPOFOL N/A 04/21/2015   Procedure: COLONOSCOPY WITH PROPOFOL;  Surgeon: Jeani Hawking, MD;  [facility] ENDOSCOPY;  Service: Endoscopy;  Laterality: N/A;   CYSTOSCOPY W/ DILATION OF BLADDER N/A    EPIDURAL BLOCK INJECTION Left 04/12/2016   Left Medial Nerve Block and Left L5 ramus block, Dr Sheran Luz    EPIDURAL BLOCK INJECTION  03/21/2016   Left L3-4 medial branch block and Left L5 & dorsal ramus block    EPIDURAL BLOCK INJECTION N/A 10/25/2016   Sheran Luz, MD. Lumbar medial branch block   EPIDURAL BLOCK INJECTION N/A 02/09/2017   Sheran Luz, MD.  Bilateral L3/4 medial branch block, bilateral L5 dorsal ramus block   EPIDURAL BLOCK INJECTION N/A 07/04/2017   Sheran Luz, MD   FECAL TRANSPLANT  04/21/2015   Procedure: FECAL TRANSPLANT;  Surgeon: Jeani Hawking, MD;  Location: [facility] END[facility]COPY;  Service: Endoscopy;;   HIP ARTHROPLASTY Left    HIP ARTHROSCOPY Left 03/06/2018   Left hip arthroplasty, redo for loose hip arthroplasty. Procedure at [facility] hospital   INJECTION HIP INTRA ARTICULAR Left 11/2015   for OA by Dr Vilma Prader IMPLANT PLACEMENT  04/2021   Floyde Parkins MD ([facility])   INTERSTIM IMPLANT REVISION N/A 09/2021   Floyde Parkins MD ([facility])   OTHER SURGICAL HISTORY Left 2016   Left L3/L4 medial nerve block and Left L5 Dorsal Ramus block Dr Rosine Abe   TOTAL HIP ARTHROPLASTY Left 07/25/2016   Procedure: LEFT TOTAL HIP ARTHROPLASTY ANTERIOR APPROACH;  Surgeon: Durene Romans, MD;  Location: [facility];  Service: Orthopedics;  Laterality: Left;    Family Psychiatric  History:   Family History:  Family History  Problem Relation Age of Onset   Alzheimer's disease Mother    Hyperlipidemia Mother    Hypertension Mother    Osteoporosis Mother    Parkinson's disease Mother    Migraines Sister    Allergies Sister    Hypertension Sister    Hyperlipidemia Brother    Cardiomyopathy Brother    Diabetes type II Brother    Kidney disease Brother    Asthma Brother    Heart disease Brother    Hypertension Brother  Hyperlipidemia Brother    Kidney disease Brother    Heart disease Brother    Heart disease Father    Hyperlipidemia Father    Hypertension Father    Aortic aneurysm Father    Early death Father 59   Diabetes type II Other    Breast cancer Maternal Aunt     Social History:   Social History   Socioeconomic History   Marital status: Married    Spouse name: Devynn Fukui   Number of children: 1   Years of education: 16   Highest education level: Bachelor's degree (e.g., BA, AB, BS)  Occupational History   Occupation: Dietitian: [employer]    Comment: Retired   Occupation: Database administrator: [employer]    Comment: Part-time  Tobacco Use   Smoking status: Never    Passive exposure: Past   Smokeless tobacco: Never  Vaping Use   Vaping Use: Never used  Substance and Sexual Activity   Alcohol use: No    Alcohol/week: 0.0 standard drinks of alcohol    Comment: No use in 30 years.   Drug use: No   Sexual activity: Yes    Partners: Male    Birth control/protection: None  Other Topics Concern   Not on file  Social History Narrative   Prior PCP With [facility] at [place], [place].   Married, lives with [place], (b. 1954)   Son in [place]. (2) grandchildren.   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    Wears seatbelt usually   No religious beliefs affecting healthcare   No difficulty taking medications as  directed.       Home has working smoke alarm   No home throw rugs   Does not have nonslip bathtub / shower    Has railings on all stairs   Home is free from Clutter.    Right-handed.      Best number to reach patient [phone] (M) as of 09/21/2021.    No regular exercise of 3 times a week for 30 minutes at a time      Timea Kozisek has Scientist, water quality and a Sports administrator is husband, Venetia Welford [phone]      Caffeine: 2-3 cups coffee per day   Social Determinants of Health   Financial Resource Strain: Low Risk  (12/07/2022)   Overall Financial Resource Strain (CARDIA)    Difficulty of Paying Living Expenses: Not hard at all  Food Insecurity: No Food Insecurity (12/07/2022)   Hunger Vital Sign    Worried About Running Out of Food in the Last Year: Never true    Ran Out of Food in the Last Year: Never true  Transportation Needs: No Transportation Needs (12/07/2022)   PRAPARE - Administrator, Civil Service (Medical): No    Lack of Transportation (Non-Medical): No  Physical Activity: Inactive (12/07/2022)   Exercise Vital Sign    Days of Exercise per Week: 0 days    Minutes of Exercise per Session: 0 min  Stress: No Stress Concern Present (12/07/2022)   [employer] of Occupational Health - Occupational Stress Questionnaire    Feeling of Stress : Only a little  Social Connections: Moderately Integrated (12/07/2022)   Social Connection and Isolation Panel [NHANES]    Frequency of Communication with Friends and Family: More than three times a  week    Frequency of Social Gatherings with Friends and Family: Twice a week    Attends Religious Services: Never    Database administrator or Organizations: No    Attends Engineer, structural: 1 to 4 times per year    Marital Status: Married    Additional Social History:   Allergies:   Allergies  Allergen Reactions   Sulfa Antibiotics Itching   Chlorhexidine Gluconate Other (See Comments)  and Rash    Burning   Codeine Nausea Only    "head nausea"    Levofloxacin Other (See Comments)    Pain in arm and behind ankles. Pain in arm and behind ankles.    Lyrica [Pregabalin] Other (See Comments)    Dizziness, nausea   Methylprednisolone     Flushing of the face   Prednisone     Flushing of the face   Betadine [Povidone Iodine] Rash    burning   Latex Itching and Rash    burning   Wellbutrin [Bupropion] Anxiety    Metabolic Disorder Labs: Lab Results  Component Value Date   HGBA1C 5.3 12/11/2022   No results found for: "PROLACTIN" Lab Results  Component Value Date   CHOL 176 12/11/2022   TRIG 117 12/11/2022   HDL 54 12/11/2022   CHOLHDL 3.3 10/11/2021   VLDL 26 12/14/2014   LDLCALC 101 (H) 12/11/2022   LDLCALC 104 (H) 10/11/2021   Lab Results  Component Value Date   TSH 4.960 (H) 12/11/2022    Therapeutic Level Labs: No results found for: "LITHIUM" No results found for: "CBMZ" No results found for: "VALPROATE"  Current Medications: Current Outpatient Medications  Medication Sig Dispense Refill   Ascorbic Acid (VITAMIN C PO) Take by mouth.     cholecalciferol (VITAMIN D3) 25 MCG (1000 UNIT) tablet Take 1,000 Units by mouth daily.     citalopram (CELEXA) 20 MG tablet Take 1 tablet (20 mg total) by mouth daily. 90 tablet 3   eletriptan (RELPAX) 40 MG tablet Take 1 tablet (40 mg total) by mouth as needed for migraine or headache. May repeat in 2 hours if headache persists or recurs. 10 tablet 11   Famotidine-Ca Carb-Mag Hydrox (PEPCID COMPLETE PO) Take by mouth.     Galcanezumab-gnlm (EMGALITY) 120 MG/ML SOAJ Inject 120 mg into the skin every 30 (thirty) days. 3 mL 3   levothyroxine (SYNTHROID) 125 MCG tablet Take 0.5 tablets (62.5 mcg total) by mouth daily before breakfast. 45 tablet 3   meloxicam (MOBIC) 15 MG tablet Take by mouth as needed.     methenamine (HIPREX) 1 g tablet Take 1 g by mouth 2 (two) times daily with a meal.     methocarbamol  (ROBAXIN) 500 MG tablet Take 500 mg by mouth as needed.      minoxidil (LONITEN) 2.5 MG tablet Take by mouth daily.     rosuvastatin (CRESTOR) 10 MG tablet TAKE 1 TABLET(10 MG) BY MOUTH DAILY 90 tablet 3   SYSTANE BALANCE 0.6 % SOLN      traMADol (ULTRAM) 50 MG tablet Take 50 mg by mouth as needed.     Vitamin D, Ergocalciferol, (DRISDOL) 1.25 MG (50000 UNIT) CAPS capsule Take 1 capsule (50,000 Units total) by mouth every 7 (seven) days. 5 capsule 0   zolpidem (AMBIEN) 5 MG tablet Take 1 tablet (5 mg total) by mouth at bedtime as needed for sleep. 30 tablet 4   zolpidem (AMBIEN) 5 MG tablet Take 1 tablet (5 mg  total) by mouth at bedtime as needed for sleep. 30 tablet 5   dicyclomine (BENTYL) 10 MG capsule Take 1 capsule (10 mg total) by mouth 3 (three) times daily as needed. (Patient not taking: Reported on 02/13/2023) 90 capsule PRN   metFORMIN (GLUCOPHAGE) 500 MG tablet TAKE 1/2 TABLET(250 MG) BY MOUTH DAILY (Patient not taking: Reported on 02/13/2023) 45 tablet 0   No current facility-administered medications for this visit.    Musculoskeletal: Strength & Muscle Tone: within normal limits Gait & Station: normal Patient leans: Right  Psychiatric Specialty Exam: Review of Systems  Blood pressure 119/86, pulse 94, height 5\' 6"  (1.676 m), weight 185 lb (83.9 kg).Body mass index is 29.86 kg/m.  General Appearance: Fairly Groomed  Eye Contact:  Good  Speech:  Clear and Coherent  Volume:  Normal  Mood:  Anxious  Affect:  Appropriate  Thought Process:  Coherent  Orientation:  Full (Time, Place, and Person)  Thought Content:  WDL  Suicidal Thoughts:  No  Homicidal Thoughts:  No  Memory:  NA  Judgement:  Good  Insight:  Good  Psychomotor Activity:  Normal  Concentration:  Concentration: Good  Recall:  Good  Fund of Knowledge:Good  Language: Good  Akathisia:  No  Handed:  Right  AIMS (if indicated):  not done  Assets:  Desire for Improvement  ADL's:  Intact  Cognition: WNL  Sleep:   Good   Screenings: PHQ2-9    Flowsheet Row Clinical Support from 12/07/2022 in [facility] Office Visit from 04/10/2022 in [facility] Office Visit from 02/02/2022 in [facility] Office Visit from 10/11/2021 in [facility] Clinical Support from 09/21/2021 in [facility]  PHQ-2 Total Score 0 1 2 2  0  PHQ-9 Total Score -- 10 9 10  --       Assessment and Plan:     Today the patient is doing much better.  She is sleeping well taking 5 mg of Ambien.  I have no problems giving this woman Ambien 5 mg.  She uses no drugs drinks no alcohol and has had no falls and is not taking any psychotropic medications.  She is very responsible including benefits by the Ambien.  The patient will have her CPAP machine adjusted and she will return to see me in approximately 5 months.  If the CPAP machine can work successfully we will discuss with her about discontinuing the Ambien.  The patient is very stable.  Noted is that she gets Celexa from her primary care doctor.  Patient/Guardian was advised Release of Information must be obtained prior to any record release in order to collaborate their care with an outside provider. Patient/Guardian was advised if they have not already done so to contact the registration department to sign all necessary forms in order for [name] to release information regarding their care.   Consent: Patient/Guardian gives verbal consent for treatment and assignment of benefits for services provided during this visit. Patient/Guardian expressed understanding and agreed to proceed.   Gypsy Balsam, MD 6/4/20241:42 PM

## 2023-02-13 NOTE — Therapy (Signed)
OUTPATIENT PHYSICAL THERAPY TREATMENT   Patient Name: Molly Giles MRN: [id number] DOB:1955-12-29, 67 y.o., female Today's Date: 02/13/2023  END OF SESSION:  PT End of Session - 02/13/23 1110     Visit Number 8    Date for PT Re-Evaluation 02/26/23    Authorization Type Medicare A and B    Progress Note Due on Visit 10    PT Start Time 1100    PT Stop Time 1140    PT Time Calculation (min) 40 min    Activity Tolerance Patient tolerated treatment well    Behavior During Therapy [facility] for tasks assessed/performed              Past Medical History:  Diagnosis Date   Abdominal bloating 05/10/2021   Abdominal discomfort, generalized 05/07/2016   Acute gastritis 11/19/2020   Allergic rhinoconjunctivitis 07/02/2015   Anal fissure 11/19/2020   Anterior to posterior tear of superior glenoid labrum of left shoulder 02/22/2021   Arthritis    Benign positional vertigo 04/2015   Responded well to [facility]   Bruxism (teeth grinding)    Chronic contact dermatitis 08/06/2018   Per allergist Dr Edwyna Ready.    Chronic migraine 02/25/2016   Per Dr Lucia Gaskins review in notes from [facility] from September 2015. Showed total headache days last month 18. Severe headache days 7 days. Moderate headache days 5 days. Mild headache days last month sick days. Days without headache last month 10 days. Symptoms associated with photophobia, phonophobia, osmophobia, neck pain, dizziness, jaw pain, nasal congestion, vision disturbances, tingling and numbness, weakness and worsening with activity. Each headache attack last 3 hours depending on treatment in severity. Left side, the right side, easier side, the frontal area in the back of the head. Characterized as throbbing, pressure, tightness, squeezing, stabbing and burning    Chronic migraine without aura 05/15/2013    Dr Lucia Gaskins [facility]   Chronic tonsillitis 01/27/2019   DDD (degenerative disc disease),  cervical 11/18/2018   DDD (degenerative disc disease), lumbar    Depression    Disc displacement, lumbar    Dysphagia 10/11/2021   Dysrhythmia    seen by dr Donnie Aho- not a problem since she has been on Bystolic   Episodic cluster headache, not intractable 03/06/2017   Essential hypertension 05/15/2013   Family history of adverse reaction to anesthesia    Brother- N/V   Family history of premature CAD 05/15/2013   Fibromyalgia syndrome 07/01/2015   Management by Dr Kathie Rhodes. Devashwar (Rheum)    GERD (gastroesophageal reflux disease) 12/16/2014   H/O seasonal allergies    Hallux rigidus of right foot 09/25/2022   Hammer toe    Left great toe   Hashimoto's thyroiditis    Per patient, diagnosed by Dr. Ferdinand Cava   Hearing loss of both ears 07/27/2015   mild to borderline moderate low frequency hearing loss improving to within normal limits bilaterally on audiology testing at [facility] in November 2016.     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   Hyperlipidemia 1998   Hypokalemia due to excessive gastrointestinal loss of potassium 07/28/2019   Hypothyroidism    Impairment of balance 02/2015  Consequent of postconcussive syndrome   Injury of triangular fibrocartilage complex of left wrist 02/25/2019   Dx 02/25/19 Bradly Bienenstock IV MD ([facility])   Insulin resistance 07/04/2017   Internal hemorrhoid 01/28/2021   Internal hemorrhoid seen on colonoscopy 10/2020 Roderic Scarce MD [facility])   Interstitial cystitis    Irritable bowel syndrome with diarrhea 04/03/2016   Left ventricular hypertrophy, mild 02/25/2016   ECHOcardiogram report 06/17/15 showing EF55-60%, mild LVH and G1DD     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HCC) 03/10/2018   [facility]   Lumbar facet joint pain    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   Mood disorder (HCC)    Morbid obesity (HCC) 03/06/2017   Musculoskeletal neck pain 07/14/2015   Nocturnal hypoxemia 03/06/2017   Normal coronary arteries 05/14/2014   Obesity (BMI 30.0-34.9) 01/29/2017   Odynophagia 12/20/2021   Osteoarthritis of left hip 11/01/2015   MRI order by Dr Charlann Boxer (ortho) 10/2015 showed significant arthritis of left hip joint with cystic changes in femoral head c/w osteoarthritis   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, lumbar region 10/30/2011   Other insomnia 11/09/2016   Overweight    Pain in joint of left shoulder 11/09/2016   Pain in joint, multiple sites 11/18/2018   Palpitations 05/15/2013   [facility] manages   Perianal candidiasis 12/20/2021   Periodic limb movement sleep disorder 03/28/2017   Perirectal cyst 05/07/2016   Poison ivy dermatitis 03/24/2021   PONV (postoperative nausea and vomiting)    Positive ANA (antinuclear antibody) 11/18/2018   Post concussion syndrome 06/06/2015   Post concussive syndrome 07/14/2015   Ms Tuft's post-concussive syndrome manifesting in vertigo and headache, mood changes, poor balance, dizziness, and decreased concentration per Dr Lucia Gaskins at [facility].    Posterior vitreous detachment of right eye 2014   Premature menopause 12/20/2021   Rectal abscess 05/10/2021   S/P left THA, AA 07/25/2016   Sacroiliac inflammation (HCC) 08/18/2021   Shingles    Shortness of breath dyspnea    with exertion   Sicca syndrome (HCC) 11/18/2018   (+) ANA   Sleep walking and eating 03/06/2017   Snoring 03/06/2017   Spondylosis of lumbar region without myelopathy or radiculopathy 10/30/2011   TFC (triangular fibrocartilage complex) injury 02/25/2019   Thyroid nodule 08/11/2009   Findings: The thyroid gland is within normal limits in size.  The gland is  diffusely inhomogeneous. A small solid nodule is noted in the lower pole  medially on the right of 7 x 6 x 8 mm. A small solid nodule is noted inferiorly on the left of 3 x 3 x 4 mm.  IMPRESSION:  The thyroid gland is within normal limits in size with only small solid nodules present, the largest of only 8 mm in diameter on the right.     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    Trochanteric bursitis, right hip 04/26/2020   Tubular adenoma of colon 01/28/2021   Colonoscopy screening 4 mm tubular Adenoma polyp Charlott Rakes, MD [facility])   Tubular adenoma of colon 01/28/2021   Colonoscopy screening 4 mm tubular Adenoma polyp Charlott Rakes, MD [facility])   Vasomotor symptoms due to menopause 04/19/2017   Vitamin D deficiency 05/08/2017   Vulvitis 07/22/2020   Yeast vaginitis 07/11/2019   Past Surgical History:  Procedure Laterality Date   arthroscopy Left 01/2022   with SAD and DCR, K. Supple MD   Bladder dilitation  x 3   BREAST BIOPSY Right 2011   Benign histology   CARDIOVASCULAR STRESS TEST  2000   Unremarkable per pt report   CARPOMETACARPAL JOINT ARTHROTOMY Right 2011   COLONOSCOPY     COLONOSCOPY WITH PROPOFOL N/A 04/21/2015   Procedure: COLONOSCOPY WITH PROPOFOL;  Surgeon: Jeani Hawking, MD;  Location: [facility] END[facility]COPY;  Service: Endoscopy;  Laterality: N/A;   CYSTOSCOPY W/ DILATION OF BLADDER N/A    EPIDURAL BLOCK INJECTION Left 04/12/2016   Left Medial Nerve Block and Left L5 ramus block, Dr Sheran Luz    EPIDURAL BLOCK INJECTION  03/21/2016   Left L3-4 medial branch block and Left L5 & dorsal ramus block    EPIDURAL BLOCK INJECTION N/A 10/25/2016   Sheran Luz, MD. Lumbar medial branch block   EPIDURAL BLOCK INJECTION N/A 02/09/2017   Sheran Luz, MD.  Bilateral L3/4 medial branch block, bilateral L5 dorsal ramus block   EPIDURAL BLOCK INJECTION N/A 07/04/2017   Sheran Luz, MD   FECAL  TRANSPLANT  04/21/2015   Procedure: FECAL TRANSPLANT;  Surgeon: Jeani Hawking, MD;  Location: [facility] ENDOSCOPY;  Service: Endoscopy;;   HIP ARTHROPLASTY Left    HIP ARTHROSCOPY Left 03/06/2018   Left hip arthroplasty, redo for loose hip arthroplasty. Procedure at [facility]   INJECTION HIP INTRA ARTICULAR Left 11/2015   for OA by Dr Vilma Prader IMPLANT PLACEMENT  04/2021   Floyde Parkins MD ([facility])   INTERSTIM IMPLANT REVISION N/A 09/2021   Floyde Parkins MD ([facility])   OTHER SURGICAL HISTORY Left 2016   Left L3/L4 medial nerve block and Left L5 Dorsal Ramus block Dr Rosine Abe   TOTAL HIP ARTHROPLASTY Left 07/25/2016   Procedure: LEFT TOTAL HIP ARTHROPLASTY ANTERIOR APPROACH;  Surgeon: Durene Romans, MD;  Location: [facility];  Service: Orthopedics;  Laterality: Left;   Patient Active Problem List   Diagnosis Date Noted   Rash 02/07/2023   BMI 29.0-29.9,adult Current BMI 29.9 02/07/2023   Low serum vitamin B12 01/10/2023   OSA (obstructive sleep apnea) 12/27/2022   BMI 30.0-30.9,adult 12/27/2022   Severe obstructive sleep apnea-hypopnea syndrome 12/22/2022   Hyperglycemia 12/11/2022   Depression screening 12/11/2022   Generalized obesity 12/11/2022   SOBOE (shortness of breath on exertion) 12/11/2022   Other fatigue 12/11/2022   Sleep related headaches 11/07/2022   Intractable episodic cluster headache 11/07/2022   Sleep walking and eating 11/07/2022   Ambien use disorder, 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   Psychophysiological insomnia 11/07/2022   Rhinitis 11/03/2022   Hallux rigidus of right foot 09/25/2022   Morton neuroma, right 09/25/2022   Iliopsoas bursitis of left hip 05/08/2022   Chronic pain of right knee 05/08/2022   Arthrosis of hand 12/20/2021   History of Clostridioides difficile colitis, required fecal transplantation 12/20/2021   Mixed hyperlipidemia 12/20/2021   Meibomian gland dysfunction (MGD) of both eyes 10/17/2021   Nuclear  sclerosis of both eyes 10/17/2021   Epiretinal membrane (ERM) of left eye 10/17/2021   OAB (overactive bladder) 03/02/2021   Osteoarthritis of acromioclavicular joint 02/22/2021   Hyperhidrosis, scalp, 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   Chronic low back pain 05/14/2019   Cervical spondylosis 11/18/2018   DDD (degenerative disc disease), lumbar 11/18/2018   Keratoconjunctivitis sicca (HCC) 11/18/2018   Chronic contact dermatitis 08/06/2018   Primary osteoarthritis of left knee 06/20/2018   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HCC) 11/29/2017   Insulin resistance 07/04/2017   Vitamin D deficiency 05/08/2017   Periodic limb movement sleep  disorder 03/28/2017   Obesity (BMI 30.0-34.9) 01/29/2017   Other insomnia 11/09/2016   Irritable bowel syndrome with diarrhea 04/03/2016   Left ventricular hypertrophy, mild 02/25/2016   Mood disorder (HCC) 07/02/2015   Allergic rhinoconjunctivitis 07/02/2015   Fibromyalgia syndrome 07/01/2015   Personal history of colonic polyps 07/01/2015   GERD (gastroesophageal reflux disease) 12/16/2014   Hypothyroidism 05/15/2013   Pure hypercholesterolemia 05/15/2013   Chronic interstitial cystitis 05/15/2013   Chronic migraine without aura 05/15/2013    PCP: McDiarmid, Leighton Roach, MD   REFERRING PROVIDER: Molly Maduro, MD   REFERRING DIAG: M25.551 Pain in right hip  THERAPY DIAG:  Pain in right hip  Stiffness of right hip, not elsewhere classified  Muscle weakness (generalized)  Cramp and spasm  Difficulty in walking, not elsewhere classified  Rationale for Evaluation and Treatment: Rehabilitation  ONSET DATE: 12/11/22  SUBJECTIVE:   SUBJECTIVE STATEMENT: Still having some discomfort with inclines but overall, doing so much better than when I started.     PERTINENT HISTORY: Left hip THA PAIN:  02/13/23: Are you having pain?  1/10 Rt posterior- lateral hip, aggravated when hip is tired at the end of the day Pain is better at the start of the day.     PRECAUTIONS: None  WEIGHT BEARING RESTRICTIONS: No  FALLS:  Has patient fallen in last 6 months? No  LIVING ENVIRONMENT: Lives with: lives with their spouse Lives in: House/apartment   OCCUPATION: Retired, she has sleep apnea and tends to sleep in, gets up to do housework, goes out with friends.  She was walking her dog but unable currently due to the pain.   PLOF: Independent, Independent with basic ADLs, Independent with household mobility without device, Independent with community mobility without device, Independent with homemaking with ambulation, Independent with gait, and Independent with transfers  PATIENT GOALS: To get out of pain and be able to walk and be more active  NEXT MD VISIT: prn  OBJECTIVE:   DIAGNOSTIC FINDINGS:  Impression: Ultrasound Undersurface fraying and probable low-grade partial tearing of the  distal gluteus minimus tendon at the greater trochanter with  moderate subgluteus bursitis.  PATIENT SURVEYS:  LEFS: complete next visit  COGNITION: Overall cognitive status: Within functional limits for tasks assessed     SENSATION: WFL   MUSCLE LENGTH: Hamstrings: Right 50 deg; Left 60 deg Thomas test: Right Positive; Left Positive  POSTURE: No Significant postural limitations  PALPATION: Tender over lateral right hip at greater trochanter  LOWER EXTREMITY ROM:  WFL  LOWER EXTREMITY MMT:  MMT Right eval Left eval  Hip flexion 4+ 4+  Hip extension 3+ 4  Hip abduction 3+ 3+  Hip adduction 5 5  Hip internal rotation 4- 4-  Hip external rotation 4- 4-   LOWER EXTREMITY SPECIAL TESTS:  Hip special tests: Luisa Hart (FABER) test: positive , Trendelenburg test: positive , Thomas test: positive , and Ober's test: positive   FUNCTIONAL TESTS:  5 times sit to stand: 11.67 sec Timed up and go (TUG): 8.57 sec  GAIT: Distance walked: 30 Assistive device utilized:  oc and None Level of assistance: Complete Independence Comments:  antalgic/trendelenburg   TODAY'S TREATMENT:     DATE: 02/13/23 Nustep x 5 min level 6 -PT present to discuss progress  Hip matrix x 20 each on each LE hip abduction and hip extension 30 lbs Lateral band walks with yellow loop x 10 steps each way, 3 laps Standing hip abduction in slight squat position with blue loop x 20  Seated clam x 20 with yellow loop Supine bridge with clam x 20 with yellow loop  Side lying clam right side with yellow loop Side lying hip IR with blue loop x 10 each side Squats x 10 to chair Cone touches 3 x 10 each LE  Single leg dead lifts 10# dumbell to touch tall cone 2 sets of 10  Lunge to BOSU fwd x 10 each LE, then side x 10 each LE PPT x 20 PPT with 90/90 heel tap x 20 PPT with dying bug x 20  DATE: 5/31 Nustep x 5 min level 6 -PT present to discuss progress  Squats in front of 20 inch box holding 10# KB goblet style 3 sets of 5 Dead lifts with 10# KB 6 inches from floor 3 sets of 5 Side to wall knee dynamic isometric hip abduction towel slides on wall forward and back:  right side slides 15x, left side slides fatigues at 5 reps secondary to fatigue with WB on right Purple X-heavy power cord forward hip thrusts against resistance 15x Purple X-heavy power cord around right hip/groin with left lateral lunge 8x Patient demonstrates up and down 4 steps x2 reciprocally Hip matrix 40# x 10 each on each LE hip abduction and hip extension (verbal cues needed for proper hip position on abduction)   DATE: 02/01/23 Nustep x 5 min level 6 -PT present to discuss progress  Lateral band walks with blue loop x 10 steps each way, 3 laps Standing hip abduction in slight squat position with blue loop x 20 Cone touches 3 x 10 each LE (patient had very easy time with cone on counter, switched to cone on seat of lat pull down) Single leg dead lifts 10# kettlebell to touch tall cone 2 sets of 10  Squats x 10 to chair Hip matrix x 20 each on each LE hip abduction and hip  extension (verbal cues needed for proper hip position on abduction) Hook lying clam with blue loop x 20 Side lying clam with blue loop x 20 each side Side lying hip IR with blue loop x 10 each side Reviewed technique on side lying hip abduction for HEP                                                                                                                     PATIENT EDUCATION:  Education details: Initiated HEP and educated on leg length discrepency vs muscle imbalance Person educated: Patient Education method: Programmer, multimedia, Facilities manager, Verbal cues, and Handouts Education comprehension: verbalized understanding, returned demonstration, and verbal cues required  HOME EXERCISE PROGRAM: Access Code: [email or web address] URL: [email or web address] Date: 02/13/2023 Prepared by: Mikey Kirschner  Exercises - Sidelying Hip Abduction  - 1 x daily - 7 x weekly - 2 sets - 10 reps - Clam with Resistance  - 1 x daily - 7 x weekly - 2 sets - 10 reps - Sidelying Reverse Clamshell with Resistance  - 1 x daily - 7 x weekly - 2 sets -  10 reps - Prone Hip Extension  - 1 x daily - 7 x weekly - 2 sets - 10 reps - Standing Hamstring Stretch on Chair  - 1 x daily - 7 x weekly - 1 sets - 3 reps - 30 sec hold - Standing Hip Extension with Counter Support  - 2 x daily - 7 x weekly - 2 sets - 10 reps - Standing Hip Abduction with Anterior Support  - 2 x daily - 7 x weekly - 2 sets - 10 reps - Sit to Stand Without Arm Support  - 2 x daily - 7 x weekly - 2 sets - 10 reps - Staggered Stance Step Throughs (Mirrored)  - 1 x daily - 7 x weekly - 2 sets - 5 reps - Squat with Chair Touch  - 1 x daily - 7 x weekly - 2 sets - 5 reps - Half Deadlift with Kettlebell  - 1 x daily - 7 x weekly - 2 sets - 5 reps - Single Leg Cone Touch  - 1 x daily - 7 x weekly - 1 sets - 10 reps - Forward Reach with Kettlebell  - 1 x daily - 7 x weekly - 1 sets - 10 reps - Supine Posterior Pelvic Tilt  - 1 x daily - 7 x weekly -  1 sets - 20 reps - Supine 90/90 Alternating Heel Touches with Posterior Pelvic Tilt  - 1 x daily - 7 x weekly - 1 sets - 20 reps - Supine Dead Bug with Leg Extension  - 1 x daily - 7 x weekly - 1 sets - 20 reps  ASSESSMENT:  CLINICAL IMPRESSION: Patient is progressing very well now.  She is tolerating higher level activities with less fatigue.  She demonstrates good hip stability and balance on single leg balance activities.  She was able to do yellow loop vs blue loop on all hip exercises today.  We added pelvic tilt and core stabilization exercises today.  Patient was challenged by this but completed all reps with only minimal back pain.  Reminders provided to maintain neutral pelvis on all core activities.  She would benefit from continuing skilled PT for hip strengthening and core stabilization.     OBJECTIVE IMPAIRMENTS: Abnormal gait, difficulty walking, decreased ROM, decreased strength, increased fascial restrictions, increased muscle spasms, impaired flexibility, and pain.   ACTIVITY LIMITATIONS: bending, sitting, squatting, stairs, transfers, bed mobility, and dressing  PARTICIPATION LIMITATIONS: meal prep, cleaning, laundry, driving, shopping, community activity, and yard work  PERSONAL FACTORS: Age, Fitness, and Past/current experiences are also affecting patient's functional outcome.   REHAB POTENTIAL: Good  CLINICAL DECISION MAKING: Stable/uncomplicated  EVALUATION COMPLEXITY: Low   GOALS: Goals reviewed with patient? Yes  SHORT TERM GOALS: Target date: 01/29/2023  Pain report to be no greater than 4/10  Baseline: Goal status: MET  2.  Patient will be independent with initial HEP  Baseline:  Goal status: MET   LONG TERM GOALS: Target date: 02/26/2023  Patient to report pain no greater than 2/10  Baseline:  Goal status: IN PROGRESS  2.  Patient to be independent with advanced HEP  Baseline:  Goal status: IN PROGRESS  3.  Patient to be able to sleep through  the night  Baseline:  Goal status: IN PROGRESS  4.  Patient to be able to stand or walk for at least 15 min without right hip pain Baseline:  Goal status: INITIAL  5.  Functional scores to improve by 1-3  seconds Baseline:  Goal status: INITIAL  6.  Patient to report 85% improvement in overall symptoms  Baseline:  Goal status: INITIAL   PLAN:  PT FREQUENCY: 1-2x/week  PT DURATION: 8 weeks  PLANNED INTERVENTIONS: Therapeutic exercises, Therapeutic activity, Neuromuscular re-education, Balance training, Gait training, Patient/Family education, Self Care, Joint mobilization, Stair training, DME instructions, Aquatic Therapy, Dry Needling, Electrical stimulation, Spinal mobilization, Cryotherapy, Moist heat, Taping, Traction, Ultrasound, Ionotophoresis 4mg /ml Dexamethasone, Manual therapy, and Re-evaluation  PLAN FOR NEXT SESSION:  Nustep, review exercises added to HEP, focus on bilateral hip stability training and core strength.  Mentioned DN but defers at this time (had in the past)   Victorino Dike B. Enez Monahan, PT 02/13/23 11:48 AM  [facility] [address], [address] [place], [place] [address] Phone # [phone] Fax [phone]

## 2023-02-15 ENCOUNTER — Other Ambulatory Visit (INDEPENDENT_AMBULATORY_CARE_PROVIDER_SITE_OTHER): Payer: Self-pay | Admitting: Physician Assistant

## 2023-02-15 ENCOUNTER — Ambulatory Visit: Payer: Medicare Other

## 2023-02-15 DIAGNOSIS — R262 Difficulty in walking, not elsewhere classified: Secondary | ICD-10-CM

## 2023-02-15 DIAGNOSIS — M6281 Muscle weakness (generalized): Secondary | ICD-10-CM

## 2023-02-15 DIAGNOSIS — M25551 Pain in right hip: Secondary | ICD-10-CM

## 2023-02-15 DIAGNOSIS — M62838 Other muscle spasm: Secondary | ICD-10-CM

## 2023-02-15 DIAGNOSIS — E559 Vitamin D deficiency, unspecified: Secondary | ICD-10-CM

## 2023-02-15 DIAGNOSIS — M25651 Stiffness of right hip, not elsewhere classified: Secondary | ICD-10-CM

## 2023-02-15 DIAGNOSIS — R252 Cramp and spasm: Secondary | ICD-10-CM

## 2023-02-15 NOTE — Therapy (Signed)
OUTPATIENT PHYSICAL THERAPY TREATMENT   Patient Name: Molly Giles MRN: [id number] DOB:October 23, 1955, 67 y.o., female Today's Date: 02/15/2023  END OF SESSION:  PT End of Session - 02/15/23 1151     Visit Number 9    Date for PT Re-Evaluation 02/26/23    Authorization Type Medicare A and B    Progress Note Due on Visit 10    PT Start Time 1148    Activity Tolerance Patient tolerated treatment well    Behavior During Therapy [facility] for tasks assessed/performed              Past Medical History:  Diagnosis Date   Abdominal bloating 05/10/2021   Abdominal discomfort, generalized 05/07/2016   Acute gastritis 11/19/2020   Allergic rhinoconjunctivitis 07/02/2015   Anal fissure 11/19/2020   Anterior to posterior tear of superior glenoid labrum of left shoulder 02/22/2021   Arthritis    Benign positional vertigo 04/2015   Responded well to [facility]   Bruxism (teeth grinding)    Chronic contact dermatitis 08/06/2018   Per allergist Dr Edwyna Ready.    Chronic migraine 02/25/2016   Per Dr Lucia Gaskins review in notes from [facility] from September 2015. Showed total headache days last month 18. Severe headache days 7 days. Moderate headache days 5 days. Mild headache days last month sick days. Days without headache last month 10 days. Symptoms associated with photophobia, phonophobia, osmophobia, neck pain, dizziness, jaw pain, nasal congestion, vision disturbances, tingling and numbness, weakness and worsening with activity. Each headache attack last 3 hours depending on treatment in severity. Left side, the right side, easier side, the frontal area in the back of the head. Characterized as throbbing, pressure, tightness, squeezing, stabbing and burning    Chronic migraine without aura 05/15/2013    Dr Lucia Gaskins [facility]   Chronic tonsillitis 01/27/2019   DDD (degenerative disc disease), cervical 11/18/2018   DDD (degenerative disc disease),  lumbar    Depression    Disc displacement, lumbar    Dysphagia 10/11/2021   Dysrhythmia    seen by dr Donnie Aho- not a problem since she has been on Bystolic   Episodic cluster headache, not intractable 03/06/2017   Essential hypertension 05/15/2013   Family history of adverse reaction to anesthesia    Brother- N/V   Family history of premature CAD 05/15/2013   Fibromyalgia syndrome 07/01/2015   Management by Dr Kathie Rhodes. Devashwar (Rheum)    GERD (gastroesophageal reflux disease) 12/16/2014   H/O seasonal allergies    Hallux rigidus of right foot 09/25/2022   Hammer toe    Left great toe   Hashimoto's thyroiditis    Per patient, diagnosed by Dr. Ferdinand Cava   Hearing loss of both ears 07/27/2015   mild to borderline moderate low frequency hearing loss improving to within normal limits bilaterally on audiology testing at [facility] in November 2016.     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   Hyperlipidemia 1998   Hypokalemia due to excessive gastrointestinal loss of potassium 07/28/2019   Hypothyroidism    Impairment of balance 02/2015   Consequent of postconcussive syndrome   Injury of triangular fibrocartilage complex of left wrist 02/25/2019  Dx 02/25/19 Bradly Bienenstock IV MD ([facility])   Insulin resistance 07/04/2017   Internal hemorrhoid 01/28/2021   Internal hemorrhoid seen on colonoscopy 10/2020 Roderic Scarce MD [facility])   Interstitial cystitis    Irritable bowel syndrome with diarrhea 04/03/2016   Left ventricular hypertrophy, mild 02/25/2016   ECHOcardiogram report 06/17/15 showing EF55-60%, mild LVH and G1DD    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HCC) 03/10/2018    [facility]   Lumbar facet joint pain    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   Mood disorder (HCC)    Morbid obesity (HCC) 03/06/2017   Musculoskeletal neck pain 07/14/2015   Nocturnal hypoxemia 03/06/2017   Normal coronary arteries 05/14/2014   Obesity (BMI 30.0-34.9) 01/29/2017   Odynophagia 12/20/2021   Osteoarthritis of left hip 11/01/2015   MRI order by Dr Charlann Boxer (ortho) 10/2015 showed significant arthritis of left hip joint with cystic changes in femoral head c/w osteoarthritis   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, lumbar region 10/30/2011   Other insomnia 11/09/2016   Overweight    Pain in joint of left shoulder 11/09/2016   Pain in joint, multiple sites 11/18/2018   Palpitations 05/15/2013   [facility] manages   Perianal candidiasis 12/20/2021   Periodic limb movement sleep disorder 03/28/2017   Perirectal cyst 05/07/2016   Poison ivy dermatitis 03/24/2021   PONV (postoperative nausea and vomiting)    Positive ANA (antinuclear antibody) 11/18/2018   Post concussion syndrome 06/06/2015   Post concussive syndrome 07/14/2015   Ms Correll's post-concussive syndrome manifesting in vertigo and headache, mood changes, poor balance, dizziness, and decreased concentration per Dr Lucia Gaskins at [facility].    Posterior vitreous detachment of right eye 2014   Premature menopause 12/20/2021   Rectal abscess 05/10/2021   S/P left THA, AA 07/25/2016   Sacroiliac inflammation (HCC) 08/18/2021   Shingles    Shortness of breath dyspnea    with exertion   Sicca syndrome (HCC) 11/18/2018   (+) ANA   Sleep walking and eating 03/06/2017   Snoring 03/06/2017   Spondylosis of lumbar region without myelopathy or radiculopathy 10/30/2011   TFC (triangular fibrocartilage complex) injury 02/25/2019   Thyroid nodule 08/11/2009   Findings: The thyroid gland is within normal limits in size.  The gland is diffusely inhomogeneous. A small solid nodule is noted  in the lower pole  medially on the right of 7 x 6 x 8 mm. A small solid nodule is noted inferiorly on the left of 3 x 3 x 4 mm.  IMPRESSION:  The thyroid gland is within normal limits in size with only small solid nodules present, the largest of only 8 mm in diameter on the right.     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    Trochanteric bursitis, right hip 04/26/2020   Tubular adenoma of colon 01/28/2021   Colonoscopy screening 4 mm tubular Adenoma polyp Charlott Rakes, MD [facility])   Tubular adenoma of colon 01/28/2021   Colonoscopy screening 4 mm tubular Adenoma polyp Charlott Rakes, MD [facility])   Vasomotor symptoms due to menopause 04/19/2017   Vitamin D deficiency 05/08/2017   Vulvitis 07/22/2020   Yeast vaginitis 07/11/2019   Past Surgical History:  Procedure Laterality Date   arthroscopy Left 01/2022   with SAD and DCR, K. Supple MD   Bladder dilitation     x 3   BREAST BIOPSY Right 2011   Benign histology  CARDIOVASCULAR STRESS TEST  2000   Unremarkable per pt report   CARPOMETACARPAL JOINT ARTHROTOMY Right 2011   COLONOSCOPY     COLONOSCOPY WITH PROPOFOL N/A 04/21/2015   Procedure: COLONOSCOPY WITH PROPOFOL;  Surgeon: Jeani Hawking, MD;  Location: [facility] ENDOSCOPY;  Service: Endoscopy;  Laterality: N/A;   CYSTOSCOPY W/ DILATION OF BLADDER N/A    EPIDURAL BLOCK INJECTION Left 04/12/2016   Left Medial Nerve Block and Left L5 ramus block, Dr Sheran Luz    EPIDURAL BLOCK INJECTION  03/21/2016   Left L3-4 medial branch block and Left L5 & dorsal ramus block    EPIDURAL BLOCK INJECTION N/A 10/25/2016   Sheran Luz, MD. Lumbar medial branch block   EPIDURAL BLOCK INJECTION N/A 02/09/2017   Sheran Luz, MD.  Bilateral L3/4 medial branch block, bilateral L5 dorsal ramus block   EPIDURAL BLOCK INJECTION N/A 07/04/2017   Sheran Luz, MD   FECAL TRANSPLANT  04/21/2015   Procedure: FECAL TRANSPLANT;   Surgeon: Jeani Hawking, MD;  Location: [facility] ENDOSCOPY;  Service: Endoscopy;;   HIP ARTHROPLASTY Left    HIP ARTHROSCOPY Left 03/06/2018   Left hip arthroplasty, redo for loose hip arthroplasty. Procedure at [facility] hospital   INJECTION HIP INTRA ARTICULAR Left 11/2015   for OA by Dr Vilma Prader IMPLANT PLACEMENT  04/2021   Floyde Parkins MD ([facility])   INTERSTIM IMPLANT REVISION N/A 09/2021   Floyde Parkins MD ([facility])   OTHER SURGICAL HISTORY Left 2016   Left L3/L4 medial nerve block and Left L5 Dorsal Ramus block Dr Rosine Abe   TOTAL HIP ARTHROPLASTY Left 07/25/2016   Procedure: LEFT TOTAL HIP ARTHROPLASTY ANTERIOR APPROACH;  Surgeon: Durene Romans, MD;  Location: [facility];  Service: Orthopedics;  Laterality: Left;   Patient Active Problem List   Diagnosis Date Noted   Rash 02/07/2023   BMI 29.0-29.9,adult Current BMI 29.9 02/07/2023   Low serum vitamin B12 01/10/2023   OSA (obstructive sleep apnea) 12/27/2022   BMI 30.0-30.9,adult 12/27/2022   Severe obstructive sleep apnea-hypopnea syndrome 12/22/2022   Hyperglycemia 12/11/2022   Depression screening 12/11/2022   Generalized obesity 12/11/2022   SOBOE (shortness of breath on exertion) 12/11/2022   Other fatigue 12/11/2022   Sleep related headaches 11/07/2022   Intractable episodic cluster headache 11/07/2022   Sleep walking and eating 11/07/2022   Ambien use disorder, 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   Psychophysiological insomnia 11/07/2022   Rhinitis 11/03/2022   Hallux rigidus of right foot 09/25/2022   Morton neuroma, right 09/25/2022   Iliopsoas bursitis of left hip 05/08/2022   Chronic pain of right knee 05/08/2022   Arthrosis of hand 12/20/2021   History of Clostridioides difficile colitis, required fecal transplantation 12/20/2021   Mixed hyperlipidemia 12/20/2021   Meibomian gland dysfunction (MGD) of both eyes 10/17/2021   Nuclear sclerosis of both eyes 10/17/2021   Epiretinal membrane  (ERM) of left eye 10/17/2021   OAB (overactive bladder) 03/02/2021   Osteoarthritis of acromioclavicular joint 02/22/2021   Hyperhidrosis, scalp, 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   Chronic low back pain 05/14/2019   Cervical spondylosis 11/18/2018   DDD (degenerative disc disease), lumbar 11/18/2018   Keratoconjunctivitis sicca (HCC) 11/18/2018   Chronic contact dermatitis 08/06/2018   Primary osteoarthritis of left knee 06/20/2018   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HCC) 11/29/2017   Insulin resistance 07/04/2017   Vitamin D deficiency 05/08/2017   Periodic limb movement sleep disorder 03/28/2017   Obesity (BMI 30.0-34.9) 01/29/2017   Other insomnia 11/09/2016  Irritable bowel syndrome with diarrhea 04/03/2016   Left ventricular hypertrophy, mild 02/25/2016   Mood disorder (HCC) 07/02/2015   Allergic rhinoconjunctivitis 07/02/2015   Fibromyalgia syndrome 07/01/2015   Personal history of colonic polyps 07/01/2015   GERD (gastroesophageal reflux disease) 12/16/2014   Hypothyroidism 05/15/2013   Pure hypercholesterolemia 05/15/2013   Chronic interstitial cystitis 05/15/2013   Chronic migraine without aura 05/15/2013    PCP: McDiarmid, Leighton Roach, MD   REFERRING PROVIDER: Molly Maduro, MD   REFERRING DIAG: M25.551 Pain in right hip  THERAPY DIAG:  Pain in right hip  Stiffness of right hip, not elsewhere classified  Muscle weakness (generalized)  Cramp and spasm  Difficulty in walking, not elsewhere classified  Other muscle spasm  Rationale for Evaluation and Treatment: Rehabilitation  ONSET DATE: 12/11/22  SUBJECTIVE:   SUBJECTIVE STATEMENT: "My right hamstring was really sore"      PERTINENT HISTORY: Left hip THA PAIN:  02/15/23: Are you having pain?  1/10  "just sore" Rt posterior- lateral hip, aggravated when hip is tired at the end of the day Pain is better at the start of the day.    PRECAUTIONS: None  WEIGHT BEARING RESTRICTIONS: No  FALLS:  Has patient fallen in  last 6 months? No  LIVING ENVIRONMENT: Lives with: lives with their spouse Lives in: House/apartment   OCCUPATION: Retired, she has sleep apnea and tends to sleep in, gets up to do housework, goes out with friends.  She was walking her dog but unable currently due to the pain.   PLOF: Independent, Independent with basic ADLs, Independent with household mobility without device, Independent with community mobility without device, Independent with homemaking with ambulation, Independent with gait, and Independent with transfers  PATIENT GOALS: To get out of pain and be able to walk and be more active  NEXT MD VISIT: prn  OBJECTIVE:   DIAGNOSTIC FINDINGS:  Impression: Ultrasound Undersurface fraying and probable low-grade partial tearing of the  distal gluteus minimus tendon at the greater trochanter with  moderate subgluteus bursitis.  PATIENT SURVEYS:  LEFS: complete next visit  COGNITION: Overall cognitive status: Within functional limits for tasks assessed     SENSATION: WFL   MUSCLE LENGTH: Hamstrings: Right 50 deg; Left 60 deg Thomas test: Right Positive; Left Positive  POSTURE: No Significant postural limitations  PALPATION: Tender over lateral right hip at greater trochanter  LOWER EXTREMITY ROM:  WFL  LOWER EXTREMITY MMT:  MMT Right eval Left eval  Hip flexion 4+ 4+  Hip extension 3+ 4  Hip abduction 3+ 3+  Hip adduction 5 5  Hip internal rotation 4- 4-  Hip external rotation 4- 4-   LOWER EXTREMITY SPECIAL TESTS:  Hip special tests: Luisa Hart (FABER) test: positive , Trendelenburg test: positive , Thomas test: positive , and Ober's test: positive   FUNCTIONAL TESTS:  5 times sit to stand: 11.67 sec Timed up and go (TUG): 8.57 sec  GAIT: Distance walked: 30 Assistive device utilized:  oc and None Level of assistance: Complete Independence Comments: antalgic/trendelenburg   TODAY'S TREATMENT:     DATE: 02/15/23 Nustep x 5 min level 6 -PT  present to discuss progress  Seated hamstring stretch 3 x 30 seconds Squats x 10 to chair Lateral band walks with yellow loop x 10 steps each way, 3 laps Seated clam x 20 with yellow loop Supine bridge with clam x 20 with yellow loop  Side lying clam right side with yellow loop Side lying hip IR with blue loop  x 10 each side PPT x 20 PPT with 90/90 heel tap x 20 PPT with dying bug x 20 Hip matrix x 20 each on each LE hip abduction and hip extension 45 lbs  DATE: 02/13/23 Nustep x 5 min level 6 -PT present to discuss progress  Hip matrix x 20 each on each LE hip abduction and hip extension 30 lbs Lateral band walks with yellow loop x 10 steps each way, 3 laps Standing hip abduction in slight squat position with blue loop x 20 Seated clam x 20 with yellow loop Supine bridge with clam x 20 with yellow loop  Side lying clam right side with yellow loop Side lying hip IR with blue loop x 10 each side Squats x 10 to chair Cone touches 3 x 10 each LE  Single leg dead lifts 10# dumbell to touch tall cone 2 sets of 10  Lunge to BOSU fwd x 10 each LE, then side x 10 each LE PPT x 20 PPT with 90/90 heel tap x 20 PPT with dying bug x 20  DATE: 5/31 Nustep x 5 min level 6 -PT present to discuss progress  Squats in front of 20 inch box holding 10# KB goblet style 3 sets of 5 Dead lifts with 10# KB 6 inches from floor 3 sets of 5 Side to wall knee dynamic isometric hip abduction towel slides on wall forward and back:  right side slides 15x, left side slides fatigues at 5 reps secondary to fatigue with WB on right Purple X-heavy power cord forward hip thrusts against resistance 15x Purple X-heavy power cord around right hip/groin with left lateral lunge 8x Patient demonstrates up and down 4 steps x2 reciprocally Hip matrix 40# x 10 each on each LE hip abduction and hip extension (verbal cues needed for proper hip position on abduction)                                                                                                                      PATIENT EDUCATION:  Education details: Initiated HEP and educated on leg length discrepency vs muscle imbalance Person educated: Patient Education method: Programmer, multimedia, Facilities manager, Verbal cues, and Handouts Education comprehension: verbalized understanding, returned demonstration, and verbal cues required  HOME EXERCISE PROGRAM: Access Code: [email or web address] URL: [email or web address] Date: 02/13/2023 Prepared by: Mikey Kirschner  Exercises - Sidelying Hip Abduction  - 1 x daily - 7 x weekly - 2 sets - 10 reps - Clam with Resistance  - 1 x daily - 7 x weekly - 2 sets - 10 reps - Sidelying Reverse Clamshell with Resistance  - 1 x daily - 7 x weekly - 2 sets - 10 reps - Prone Hip Extension  - 1 x daily - 7 x weekly - 2 sets - 10 reps - Standing Hamstring Stretch on Chair  - 1 x daily - 7 x weekly - 1 sets - 3 reps - 30 sec hold - Standing Hip Extension with Counter Support  - 2  x daily - 7 x weekly - 2 sets - 10 reps - Standing Hip Abduction with Anterior Support  - 2 x daily - 7 x weekly - 2 sets - 10 reps - Sit to Stand Without Arm Support  - 2 x daily - 7 x weekly - 2 sets - 10 reps - Staggered Stance Step Throughs (Mirrored)  - 1 x daily - 7 x weekly - 2 sets - 5 reps - Squat with Chair Touch  - 1 x daily - 7 x weekly - 2 sets - 5 reps - Half Deadlift with Kettlebell  - 1 x daily - 7 x weekly - 2 sets - 5 reps - Single Leg Cone Touch  - 1 x daily - 7 x weekly - 1 sets - 10 reps - Forward Reach with Kettlebell  - 1 x daily - 7 x weekly - 1 sets - 10 reps - Supine Posterior Pelvic Tilt  - 1 x daily - 7 x weekly - 1 sets - 20 reps - Supine 90/90 Alternating Heel Touches with Posterior Pelvic Tilt  - 1 x daily - 7 x weekly - 1 sets - 20 reps - Supine Dead Bug with Leg Extension  - 1 x daily - 7 x weekly - 1 sets - 20 reps  ASSESSMENT:  CLINICAL IMPRESSION: Tijera is making excellent progress now.  She has 2 visits left.  She  was sore in right hamstring from single leg dead lifts last visit.  We left hamstring activities off for today.  She was able to complete all other tasks with ease.    She would benefit from continuing skilled PT for her last 2 visits for hip strengthening and core stabilization.     OBJECTIVE IMPAIRMENTS: Abnormal gait, difficulty walking, decreased ROM, decreased strength, increased fascial restrictions, increased muscle spasms, impaired flexibility, and pain.   ACTIVITY LIMITATIONS: bending, sitting, squatting, stairs, transfers, bed mobility, and dressing  PARTICIPATION LIMITATIONS: meal prep, cleaning, laundry, driving, shopping, community activity, and yard work  PERSONAL FACTORS: Age, Fitness, and Past/current experiences are also affecting patient's functional outcome.   REHAB POTENTIAL: Good  CLINICAL DECISION MAKING: Stable/uncomplicated  EVALUATION COMPLEXITY: Low   GOALS: Goals reviewed with patient? Yes  SHORT TERM GOALS: Target date: 01/29/2023  Pain report to be no greater than 4/10  Baseline: Goal status: MET  2.  Patient will be independent with initial HEP  Baseline:  Goal status: MET   LONG TERM GOALS: Target date: 02/26/2023  Patient to report pain no greater than 2/10  Baseline:  Goal status: MET 02/15/23  2.  Patient to be independent with advanced HEP  Baseline:  Goal status: MET 02/15/23  3.  Patient to be able to sleep through the night  Baseline:  Goal status: IN PROGRESS  4.  Patient to be able to stand or walk for at least 15 min without right hip pain Baseline:  Goal status: MET 02/15/23  5.  Functional scores to improve by 1-3 seconds Baseline:  Goal status: INITIAL  6.  Patient to report 85% improvement in overall symptoms  Baseline:  Goal status: INITIAL   PLAN:  PT FREQUENCY: 1-2x/week  PT DURATION: 8 weeks  PLANNED INTERVENTIONS: Therapeutic exercises, Therapeutic activity, Neuromuscular re-education, Balance training, Gait  training, Patient/Family education, Self Care, Joint mobilization, Stair training, DME instructions, Aquatic Therapy, Dry Needling, Electrical stimulation, Spinal mobilization, Cryotherapy, Moist heat, Taping, Traction, Ultrasound, Ionotophoresis 4mg /ml Dexamethasone, Manual therapy, and Re-evaluation  PLAN FOR NEXT SESSION:  Nustep, review exercises added to HEP, focus on bilateral hip stability training and core strength.  Mentioned DN but defers at this time (had in the past)   Victorino Dike B. Jamina Macbeth, PT 02/15/23 12:31 PM  [facility] [address], [address] [place], [place] [address] Phone # [phone] Fax [phone]

## 2023-02-20 ENCOUNTER — Ambulatory Visit: Payer: Medicare Other

## 2023-02-21 ENCOUNTER — Ambulatory Visit (INDEPENDENT_AMBULATORY_CARE_PROVIDER_SITE_OTHER): Payer: Medicare Other | Admitting: Family Medicine

## 2023-02-21 ENCOUNTER — Encounter (INDEPENDENT_AMBULATORY_CARE_PROVIDER_SITE_OTHER): Payer: Self-pay | Admitting: Family Medicine

## 2023-02-21 VITALS — BP 109/76 | HR 103 | Temp 97.7°F | Ht 66.0 in | Wt 185.0 lb

## 2023-02-21 DIAGNOSIS — E559 Vitamin D deficiency, unspecified: Secondary | ICD-10-CM

## 2023-02-21 DIAGNOSIS — E669 Obesity, unspecified: Secondary | ICD-10-CM

## 2023-02-21 DIAGNOSIS — Z6829 Body mass index (BMI) 29.0-29.9, adult: Secondary | ICD-10-CM

## 2023-02-21 DIAGNOSIS — E88819 Insulin resistance, unspecified: Secondary | ICD-10-CM | POA: Diagnosis not present

## 2023-02-21 DIAGNOSIS — G4733 Obstructive sleep apnea (adult) (pediatric): Secondary | ICD-10-CM | POA: Diagnosis not present

## 2023-02-22 ENCOUNTER — Ambulatory Visit: Payer: Medicare Other

## 2023-02-22 DIAGNOSIS — R262 Difficulty in walking, not elsewhere classified: Secondary | ICD-10-CM

## 2023-02-22 DIAGNOSIS — M25551 Pain in right hip: Secondary | ICD-10-CM

## 2023-02-22 DIAGNOSIS — M25651 Stiffness of right hip, not elsewhere classified: Secondary | ICD-10-CM

## 2023-02-22 DIAGNOSIS — M6281 Muscle weakness (generalized): Secondary | ICD-10-CM

## 2023-02-22 DIAGNOSIS — R252 Cramp and spasm: Secondary | ICD-10-CM

## 2023-02-22 NOTE — Therapy (Signed)
OUTPATIENT PHYSICAL THERAPY RECERT Progress Note Reporting Period 01/01/23 to 02/22/23  See note below for Objective Data and Assessment of Progress/Goals.       Patient Name: Molly Giles MRN: [id number] DOB:1955-10-17, 67 y.o., female Today's Date: 02/22/2023  END OF SESSION:  PT End of Session - 02/22/23 1119     Visit Number 10    Date for PT Re-Evaluation 04/19/23    Authorization Type Medicare A and B    Progress Note Due on Visit 20    PT Start Time 1051    PT Stop Time 1146    PT Time Calculation (min) 55 min    Activity Tolerance Patient tolerated treatment well    Behavior During Therapy [facility] for tasks assessed/performed              Past Medical History:  Diagnosis Date   Abdominal bloating 05/10/2021   Abdominal discomfort, generalized 05/07/2016   Acute gastritis 11/19/2020   Allergic rhinoconjunctivitis 07/02/2015   Anal fissure 11/19/2020   Anterior to posterior tear of superior glenoid labrum of left shoulder 02/22/2021   Arthritis    Benign positional vertigo 04/2015   Responded well to [facility]   Bruxism (teeth grinding)    Chronic contact dermatitis 08/06/2018   Per allergist Dr Edwyna Ready.    Chronic migraine 02/25/2016   Per Dr Lucia Gaskins review in notes from [facility] from September 2015. Showed total headache days last month 18. Severe headache days 7 days. Moderate headache days 5 days. Mild headache days last month sick days. Days without headache last month 10 days. Symptoms associated with photophobia, phonophobia, osmophobia, neck pain, dizziness, jaw pain, nasal congestion, vision disturbances, tingling and numbness, weakness and worsening with activity. Each headache attack last 3 hours depending on treatment in severity. Left side, the right side, easier side, the frontal area in the back of the head. Characterized as throbbing, pressure, tightness, squeezing, stabbing and burning    Chronic migraine without  aura 05/15/2013    Dr Lucia Gaskins [facility]   Chronic tonsillitis 01/27/2019   DDD (degenerative disc disease), cervical 11/18/2018   DDD (degenerative disc disease), lumbar    Depression    Disc displacement, lumbar    Dysphagia 10/11/2021   Dysrhythmia    seen by dr Donnie Aho- not a problem since she has been on Bystolic   Episodic cluster headache, not intractable 03/06/2017   Essential hypertension 05/15/2013   Family history of adverse reaction to anesthesia    Brother- N/V   Family history of premature CAD 05/15/2013   Fibromyalgia syndrome 07/01/2015   Management by Dr Kathie Rhodes. Devashwar (Rheum)    GERD (gastroesophageal reflux disease) 12/16/2014   H/O seasonal allergies    Hallux rigidus of right foot 09/25/2022   Hammer toe    Left great toe   Hashimoto's thyroiditis    Per patient, diagnosed by Dr. Ferdinand Cava   Hearing loss of both ears 07/27/2015   mild to borderline moderate low frequency hearing loss improving to within normal limits bilaterally on audiology testing at [facility] in November 2016.     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   Hyperlipidemia 1998  Hypokalemia due to excessive gastrointestinal loss of potassium 07/28/2019   Hypothyroidism    Impairment of balance 02/2015   Consequent of postconcussive syndrome   Injury of triangular fibrocartilage complex of left wrist 02/25/2019   Dx 02/25/19 Bradly Bienenstock IV MD ([facility])   Insulin resistance 07/04/2017   Internal hemorrhoid 01/28/2021   Internal hemorrhoid seen on colonoscopy 10/2020 Roderic Scarce MD [facility])   Interstitial cystitis    Irritable bowel syndrome with diarrhea  04/03/2016   Left ventricular hypertrophy, mild 02/25/2016   ECHOcardiogram report 06/17/15 showing EF55-60%, mild LVH and G1DD    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HCC) 03/10/2018   [facility]   Lumbar facet joint pain    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   Mood disorder (HCC)    Morbid obesity (HCC) 03/06/2017   Musculoskeletal neck pain 07/14/2015   Nocturnal hypoxemia 03/06/2017   Normal coronary arteries 05/14/2014   Obesity (BMI 30.0-34.9) 01/29/2017   Odynophagia 12/20/2021   Osteoarthritis of left hip 11/01/2015   MRI order by Dr Charlann Boxer (ortho) 10/2015 showed significant arthritis of left hip joint with cystic changes in femoral head c/w osteoarthritis   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, lumbar region 10/30/2011   Other insomnia 11/09/2016   Overweight    Pain in joint of left shoulder 11/09/2016   Pain in joint, multiple sites 11/18/2018   Palpitations 05/15/2013   [facility] manages   Perianal candidiasis 12/20/2021   Periodic limb movement sleep disorder 03/28/2017   Perirectal cyst 05/07/2016   Poison ivy dermatitis 03/24/2021   PONV (postoperative nausea and vomiting)    Positive ANA (antinuclear antibody) 11/18/2018   Post concussion syndrome 06/06/2015   Post concussive syndrome 07/14/2015   Ms Mclean's post-concussive syndrome manifesting in vertigo and headache, mood changes, poor balance, dizziness, and decreased concentration per Dr Lucia Gaskins at [facility].    Posterior vitreous detachment of right eye 2014   Premature menopause 12/20/2021   Rectal abscess 05/10/2021   S/P left THA, AA 07/25/2016   Sacroiliac inflammation (HCC) 08/18/2021   Shingles    Shortness of breath dyspnea    with exertion   Sicca syndrome (HCC) 11/18/2018   (+) ANA   Sleep walking and eating 03/06/2017   Snoring 03/06/2017   Spondylosis of lumbar region without myelopathy or radiculopathy 10/30/2011   TFC (triangular fibrocartilage complex)  injury 02/25/2019   Thyroid nodule 08/11/2009   Findings: The thyroid gland is within normal limits in size.  The gland is diffusely inhomogeneous. A small solid nodule is noted in the lower pole  medially on the right of 7 x 6 x 8 mm. A small solid nodule is noted inferiorly on the left of 3 x 3 x 4 mm.  IMPRESSION:  The thyroid gland is within normal limits in size with only small solid nodules present, the largest of only 8 mm in diameter on the right.     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    Trochanteric bursitis, right hip 04/26/2020   Tubular adenoma of colon 01/28/2021   Colonoscopy screening 4 mm tubular Adenoma polyp Charlott Rakes, MD [facility])   Tubular adenoma of colon 01/28/2021   Colonoscopy screening 4 mm tubular Adenoma polyp Charlott Rakes, MD [facility])   Vasomotor symptoms due to menopause 04/19/2017   Vitamin D deficiency 05/08/2017   Vulvitis 07/22/2020   Yeast vaginitis 07/11/2019   Past Surgical History:  Procedure Laterality Date   arthroscopy Left 01/2022   with SAD and DCR, K. Supple MD   Bladder dilitation     x 3   BREAST BIOPSY Right 2011   Benign histology   CARDIOVASCULAR STRESS TEST  2000   Unremarkable per pt report   CARPOMETACARPAL JOINT ARTHROTOMY Right 2011   COLONOSCOPY     COLONOSCOPY WITH PROPOFOL N/A 04/21/2015   Procedure: COLONOSCOPY WITH PROPOFOL;  Surgeon: Jeani Hawking, MD;  Location: [facility] ENDOSCOPY;  Service: Endoscopy;  Laterality: N/A;   CYSTOSCOPY W/ DILATION OF BLADDER N/A    EPIDURAL BLOCK INJECTION Left 04/12/2016   Left Medial Nerve Block and Left L5 ramus block, Dr Sheran Luz    EPIDURAL BLOCK INJECTION  03/21/2016   Left L3-4 medial branch block and Left L5 & dorsal ramus block    EPIDURAL BLOCK INJECTION N/A 10/25/2016   Sheran Luz, MD. Lumbar medial branch block   EPIDURAL BLOCK INJECTION N/A 02/09/2017   Sheran Luz, MD.  Bilateral L3/4 medial  branch block, bilateral L5 dorsal ramus block   EPIDURAL BLOCK INJECTION N/A 07/04/2017   Sheran Luz, MD   FECAL TRANSPLANT  04/21/2015   Procedure: FECAL TRANSPLANT;  Surgeon: Jeani Hawking, MD;  Location: [facility] ENDOSCOPY;  Service: Endoscopy;;   HIP ARTHROPLASTY Left    HIP ARTHROSCOPY Left 03/06/2018   Left hip arthroplasty, redo for loose hip arthroplasty. Procedure at [facility]   INJECTION HIP INTRA ARTICULAR Left 11/2015   for OA by Dr Vilma Prader IMPLANT PLACEMENT  04/2021   Floyde Parkins MD ([facility])   INTERSTIM IMPLANT REVISION N/A 09/2021   Floyde Parkins MD ([facility])   OTHER SURGICAL HISTORY Left 2016   Left L3/L4 medial nerve block and Left L5 Dorsal Ramus block Dr Rosine Abe   TOTAL HIP ARTHROPLASTY Left 07/25/2016   Procedure: LEFT TOTAL HIP ARTHROPLASTY ANTERIOR APPROACH;  Surgeon: Durene Romans, MD;  Location: [facility];  Service: Orthopedics;  Laterality: Left;   Patient Active Problem List   Diagnosis Date Noted   Rash 02/07/2023   BMI 29.0-29.9,adult Current BMI 29.9 02/07/2023   Low serum vitamin B12 01/10/2023   OSA (obstructive sleep apnea) 12/27/2022   BMI 30.0-30.9,adult 12/27/2022   Severe obstructive sleep apnea-hypopnea syndrome 12/22/2022   Hyperglycemia 12/11/2022   Depression screening 12/11/2022   Generalized obesity 12/11/2022   SOBOE (shortness of breath on exertion) 12/11/2022   Other fatigue 12/11/2022   Sleep related headaches 11/07/2022   Intractable episodic cluster headache 11/07/2022   Sleep walking and eating 11/07/2022   Ambien use disorder, 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   Psychophysiological insomnia 11/07/2022   Rhinitis 11/03/2022   Hallux rigidus of right foot 09/25/2022   Morton neuroma, right 09/25/2022   Iliopsoas bursitis of left hip 05/08/2022   Chronic pain of right knee 05/08/2022   Arthrosis of hand 12/20/2021   History of Clostridioides difficile colitis, required fecal transplantation  12/20/2021   Mixed hyperlipidemia 12/20/2021   Meibomian gland dysfunction (MGD) of both eyes 10/17/2021   Nuclear sclerosis of both eyes 10/17/2021   Epiretinal membrane (ERM) of left eye 10/17/2021   OAB (overactive bladder) 03/02/2021   Osteoarthritis of acromioclavicular joint 02/22/2021   Hyperhidrosis, scalp, 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   Chronic low back pain 05/14/2019   Cervical spondylosis 11/18/2018   DDD (degenerative disc disease), lumbar 11/18/2018   Keratoconjunctivitis sicca (HCC) 11/18/2018   Chronic contact dermatitis 08/06/2018   Primary osteoarthritis of left knee 06/20/2018  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HCC) 11/29/2017   Insulin resistance 07/04/2017   Vitamin D deficiency 05/08/2017   Periodic limb movement sleep disorder 03/28/2017   Obesity (BMI 30.0-34.9) 01/29/2017   Other insomnia 11/09/2016   Irritable bowel syndrome with diarrhea 04/03/2016   Left ventricular hypertrophy, mild 02/25/2016   Mood disorder (HCC) 07/02/2015   Allergic rhinoconjunctivitis 07/02/2015   Fibromyalgia syndrome 07/01/2015   Personal history of colonic polyps 07/01/2015   GERD (gastroesophageal reflux disease) 12/16/2014   Hypothyroidism 05/15/2013   Pure hypercholesterolemia 05/15/2013   Chronic interstitial cystitis 05/15/2013   Chronic migraine without aura 05/15/2013    PCP: McDiarmid, Leighton Roach, MD   REFERRING PROVIDER: Molly Maduro, MD   REFERRING DIAG: M25.551 Pain in right hip  THERAPY DIAG:  Pain in right hip  Stiffness of right hip, not elsewhere classified  Muscle weakness (generalized)  Cramp and spasm  Difficulty in walking, not elsewhere classified  Rationale for Evaluation and Treatment: Rehabilitation  ONSET DATE: 12/11/22  SUBJECTIVE:   SUBJECTIVE STATEMENT: "I was doing really good but I went for a walk yesterday and at about 200 yards of walking, I had a pain in my right hip"     PERTINENT HISTORY: Left hip THA PAIN:  02/22/23: Are you having  pain?  3/10  "I had been doing well until yesterday, I set out to walk and at about 200 yards, I had a sharp pain and now it feels like it did initially, not nearly as bad but the same symptoms" Rt posterior- lateral hip, aggravated when hip is tired at the end of the day Pain is better at the start of the day.    PRECAUTIONS: None  WEIGHT BEARING RESTRICTIONS: No  FALLS:  Has patient fallen in last 6 months? No  LIVING ENVIRONMENT: Lives with: lives with their spouse Lives in: House/apartment   OCCUPATION: Retired, she has sleep apnea and tends to sleep in, gets up to do housework, goes out with friends.  She was walking her dog but unable currently due to the pain.   PLOF: Independent, Independent with basic ADLs, Independent with household mobility without device, Independent with community mobility without device, Independent with homemaking with ambulation, Independent with gait, and Independent with transfers  PATIENT GOALS: To get out of pain and be able to walk and be more active  NEXT MD VISIT: prn  OBJECTIVE:   DIAGNOSTIC FINDINGS:  Impression: Ultrasound Undersurface fraying and probable low-grade partial tearing of the  distal gluteus minimus tendon at the greater trochanter with  moderate subgluteus bursitis.  PATIENT SURVEYS:  LEFS: 33/80  COGNITION: Overall cognitive status: Within functional limits for tasks assessed     SENSATION: WFL   MUSCLE LENGTH: Hamstrings: Right 50 deg; Left 60 deg Thomas test: Right Positive; Left Positive  POSTURE: No Significant postural limitations  PALPATION: Tender over lateral right hip at greater trochanter  LOWER EXTREMITY ROM:  [facility]  LOWER EXTREMITY MMT:  MMT Right eval Right 02/22/23 Left eval Left 02/22/23  Hip flexion 4+ 4+ 4+ 4+  Hip extension 3+ 4- 4 4  Hip abduction 3+ 4- 3+ 4-  Hip adduction 5 5 5 5   Hip internal rotation 4- 4 4- 4-  Hip external rotation 4- 4 4- 4   LOWER EXTREMITY SPECIAL  TESTS:  Hip special tests: Luisa Hart (FABER) test: positive , Trendelenburg test: positive , Thomas test: positive , and Ober's test: positive   FUNCTIONAL TESTS:  Eval: 5 times sit to stand: 11.67 sec  Timed up and go (TUG): 8.57 sec  02/22/23: 5 times sit to stand: 10.10 sec Timed up and go (TUG): 6.58 sec  GAIT: Distance walked: had worked up to approx 4 blocks Assistive device utilized:  oc and None Level of assistance: Complete Independence Comments: antalgic/trendelenburg   TODAY'S TREATMENT:     DATE: 02/22/23 Recert and 10th visit completed Side lying rolling of right IT band Side lying addaday to right IT band and glutes Seated clam x 20 with yellow loop Supine bridge with clam x 20 with yellow loop  Side lying clam right side with yellow loop Side lying hip IR with blue loop x 10 each side Ice to right hip post treatment x 10 min  DATE: 02/15/23 Nustep x 5 min level 6 -PT present to discuss progress  Seated hamstring stretch 3 x 30 seconds Squats x 10 to chair Lateral band walks with yellow loop x 10 steps each way, 3 laps Seated clam x 20 with yellow loop Supine bridge with clam x 20 with yellow loop  Side lying clam right side with yellow loop Side lying hip IR with blue loop x 10 each side PPT x 20 PPT with 90/90 heel tap x 20 PPT with dying bug x 20 Hip matrix x 20 each on each LE hip abduction and hip extension 45 lbs  DATE: 02/13/23 Nustep x 5 min level 6 -PT present to discuss progress  Hip matrix x 20 each on each LE hip abduction and hip extension 30 lbs Lateral band walks with yellow loop x 10 steps each way, 3 laps Standing hip abduction in slight squat position with blue loop x 20 Seated clam x 20 with yellow loop Supine bridge with clam x 20 with yellow loop  Side lying clam right side with yellow loop Side lying hip IR with blue loop x 10 each side Squats x 10 to chair Cone touches 3 x 10 each LE  Single leg dead lifts 10# dumbell to touch tall  cone 2 sets of 10  Lunge to BOSU fwd x 10 each LE, then side x 10 each LE PPT x 20 PPT with 90/90 heel tap x 20 PPT with dying bug x 20                                                                                                                     PATIENT EDUCATION:  Education details: Initiated HEP and educated on leg length discrepency vs muscle imbalance Person educated: Patient Education method: Programmer, multimedia, Facilities manager, Verbal cues, and Handouts Education comprehension: verbalized understanding, returned demonstration, and verbal cues required  HOME EXERCISE PROGRAM: Access Code: [email or web address] URL: [email or web address] Date: 02/13/2023 Prepared by: Mikey Kirschner  Exercises - Sidelying Hip Abduction  - 1 x daily - 7 x weekly - 2 sets - 10 reps - Clam with Resistance  - 1 x daily - 7 x weekly - 2 sets - 10 reps - Sidelying Reverse Clamshell with Resistance  - 1 x daily -  7 x weekly - 2 sets - 10 reps - Prone Hip Extension  - 1 x daily - 7 x weekly - 2 sets - 10 reps - Standing Hamstring Stretch on Chair  - 1 x daily - 7 x weekly - 1 sets - 3 reps - 30 sec hold - Standing Hip Extension with Counter Support  - 2 x daily - 7 x weekly - 2 sets - 10 reps - Standing Hip Abduction with Anterior Support  - 2 x daily - 7 x weekly - 2 sets - 10 reps - Sit to Stand Without Arm Support  - 2 x daily - 7 x weekly - 2 sets - 10 reps - Staggered Stance Step Throughs (Mirrored)  - 1 x daily - 7 x weekly - 2 sets - 5 reps - Squat with Chair Touch  - 1 x daily - 7 x weekly - 2 sets - 5 reps - Half Deadlift with Kettlebell  - 1 x daily - 7 x weekly - 2 sets - 5 reps - Single Leg Cone Touch  - 1 x daily - 7 x weekly - 1 sets - 10 reps - Forward Reach with Kettlebell  - 1 x daily - 7 x weekly - 1 sets - 10 reps - Supine Posterior Pelvic Tilt  - 1 x daily - 7 x weekly - 1 sets - 20 reps - Supine 90/90 Alternating Heel Touches with Posterior Pelvic Tilt  - 1 x daily - 7 x weekly - 1  sets - 20 reps - Supine Dead Bug with Leg Extension  - 1 x daily - 7 x weekly - 1 sets - 20 reps  ASSESSMENT:  CLINICAL IMPRESSION: Jaynae had a setback in the past 2 days and has exacerbation of symptoms.  She was able to do most activities today but was experiencing what appeared to be either some glut min or medius issue or possible labral issue.  She has one visit left but we are going to recertify to see if we can restore her pain free level.    OBJECTIVE IMPAIRMENTS: Abnormal gait, difficulty walking, decreased ROM, decreased strength, increased fascial restrictions, increased muscle spasms, impaired flexibility, and pain.   ACTIVITY LIMITATIONS: bending, sitting, squatting, stairs, transfers, bed mobility, and dressing  PARTICIPATION LIMITATIONS: meal prep, cleaning, laundry, driving, shopping, community activity, and yard work  PERSONAL FACTORS: Age, Fitness, and Past/current experiences are also affecting patient's functional outcome.   REHAB POTENTIAL: Good  CLINICAL DECISION MAKING: Stable/uncomplicated  EVALUATION COMPLEXITY: Low   GOALS: Goals reviewed with patient? Yes  SHORT TERM GOALS: Target date: 01/29/2023  Pain report to be no greater than 4/10  Baseline: Goal status: MET  2.  Patient will be independent with initial HEP  Baseline:  Goal status: MET   LONG TERM GOALS: Target date: 02/26/2023  Patient to report pain no greater than 2/10  Baseline:  Goal status: MET 02/15/23  2.  Patient to be independent with advanced HEP  Baseline:  Goal status: MET 02/15/23  3.  Patient to be able to sleep through the night  Baseline:  Goal status: IN PROGRESS  4.  Patient to be able to stand or walk for at least 15 min without right hip pain Baseline:  Goal status: MET 02/15/23  5.  Functional scores to improve by 1-3 seconds Baseline:  Goal status: INITIAL  6.  Patient to report 85% improvement in overall symptoms  Baseline:  Goal status:  INITIAL  PLAN:  PT FREQUENCY: 1x/week  PT DURATION: an additional  8 weeks  PLANNED INTERVENTIONS: Therapeutic exercises, Therapeutic activity, Neuromuscular re-education, Balance training, Gait training, Patient/Family education, Self Care, Joint mobilization, Stair training, DME instructions, Aquatic Therapy, Dry Needling, Electrical stimulation, Spinal mobilization, Cryotherapy, Moist heat, Taping, Traction, Ultrasound, Ionotophoresis 4mg /ml Dexamethasone, Manual therapy, and Re-evaluation  PLAN FOR NEXT SESSION:  Nustep, review exercises added to HEP, focus on bilateral hip stability training and core strength.  Mentioned DN but defers at this time (had in the past)   Victorino Dike B. Claudie Rathbone, PT 02/22/23 1:26 PM  [facility] [address] [place], [place] [address] Phone # [phone] Fax [phone]

## 2023-02-26 NOTE — Progress Notes (Unsigned)
Chief Complaint:   OBESITY Molly Giles is here to discuss her progress with her obesity treatment plan along with follow-up of her obesity related diagnoses. Molly Giles is on the Category 1 Plan and states she is following her eating plan approximately 95% of the time. Molly Giles states she is walking and stretching for 40-45 minutes 3 times per week.  Today's visit was #: 6 Starting weight: 199 lbs Starting date: 12/11/2022 Today's weight: 185 lbs Today's date: 02/21/2023 Total lbs lost to date: 14 Total lbs lost since last in-office visit: 0  Interim History: Patient has done well with maintaining her weight.  She has been on prednisone and she still did not gain weight.  Her muscle mass has been improving with physical therapy.  Subjective:   1. Insulin resistance Patient started metformin and had a rash so she stopped.  She has been on a higher dose of prednisone which likely worsen her insulin resistance.  2. Vitamin D deficiency Patient's recent vitamin D level was not yet at goal.  She denies nausea, vomiting, or muscle weakness.  3. OSA (obstructive sleep apnea) Patient now has a new CPAP mask which seems to fit better.  She is working on getting used to her CPAP.  Assessment/Plan:   1. Insulin resistance Patient agreed to restart metformin 500 mg 1/2 tablet daily, and we will follow-up at her next visit in 3 to 4 weeks.  2. Vitamin D deficiency Patient will continue prescription vitamin D 50,000 IU once weekly #5, and we will refill for 1 month.  3. OSA (obstructive sleep apnea) Patient will continue CPAP tolerance strategies and will continue to help with weight loss.  4. BMI 29.0-29.9,adult Current BMI 29.9  5. Obesity, Beginning BMI 32.1 Molly Giles is currently in the action stage of change. As such, her goal is to continue with weight loss efforts. She has agreed to the Category 1 Plan.   Exercise goals: As is.   Behavioral modification strategies: increasing lean protein  intake, no skipping meals, and travel eating strategies.  Molly Giles has agreed to follow-up with our clinic in 3 to 4 weeks. She was informed of the importance of frequent follow-up visits to maximize her success with intensive lifestyle modifications for her multiple health conditions.   Objective:   Blood pressure 109/76, pulse (!) 103, temperature 97.7 F (36.5 C), height 5\' 6"  (1.676 m), weight 185 lb (83.9 kg), SpO2 96 %. Body mass index is 29.86 kg/m.  Lab Results  Component Value Date   CREATININE 0.88 12/11/2022   BUN 16 12/11/2022   NA 138 12/11/2022   K 4.0 12/11/2022   CL 103 12/11/2022   CO2 19 (L) 12/11/2022   Lab Results  Component Value Date   ALT 20 12/11/2022   AST 19 12/11/2022   ALKPHOS 81 12/11/2022   BILITOT 0.3 12/11/2022   Lab Results  Component Value Date   HGBA1C 5.3 12/11/2022   HGBA1C 5.2 06/26/2019   HGBA1C 5.0 02/07/2018   HGBA1C 5.2 07/04/2017   HGBA1C 5.3 03/22/2017   Lab Results  Component Value Date   INSULIN 7.8 12/11/2022   INSULIN 8.3 05/07/2018   INSULIN 11.8 07/04/2017   INSULIN 10.5 03/22/2017   Lab Results  Component Value Date   TSH 4.960 (H) 12/11/2022   Lab Results  Component Value Date   CHOL 176 12/11/2022   HDL 54 12/11/2022   LDLCALC 101 (H) 12/11/2022   TRIG 117 12/11/2022   CHOLHDL 3.3 10/11/2021  Lab Results  Component Value Date   VD25OH 41.2 12/11/2022   VD25OH 43.3 05/07/2018   VD25OH 48.5 07/04/2017   Lab Results  Component Value Date   WBC 6.4 08/04/2020   HGB 13.5 08/04/2020   HCT 41 08/04/2020   MCV 95.0 07/05/2019   PLT 262 08/04/2020   No results found for: "IRON", "TIBC", "FERRITIN"  Attestation Statements:   Reviewed by clinician on day of visit: allergies, medications, problem list, medical history, surgical history, family history, social history, and previous encounter notes.   I, Burt Knack, am acting as transcriptionist for Quillian Quince, MD.  I have reviewed the above  documentation for accuracy and completeness, and I agree with the above. -  Quillian Quince, MD

## 2023-02-27 MED ORDER — VITAMIN D (ERGOCALCIFEROL) 1.25 MG (50000 UNIT) PO CAPS: 50000.0000 [IU] | ORAL_CAPSULE | ORAL | 0 refills | Status: DC

## 2023-03-01 ENCOUNTER — Other Ambulatory Visit (HOSPITAL_COMMUNITY): Payer: Self-pay | Admitting: Orthopedic Surgery

## 2023-03-05 ENCOUNTER — Ambulatory Visit: Payer: Medicare Other | Admitting: Rehabilitative and Restorative Service Providers"

## 2023-03-05 ENCOUNTER — Encounter: Payer: Self-pay | Admitting: Rehabilitative and Restorative Service Providers"

## 2023-03-05 DIAGNOSIS — R252 Cramp and spasm: Secondary | ICD-10-CM

## 2023-03-05 DIAGNOSIS — M6281 Muscle weakness (generalized): Secondary | ICD-10-CM

## 2023-03-05 DIAGNOSIS — M25551 Pain in right hip: Secondary | ICD-10-CM

## 2023-03-05 DIAGNOSIS — R262 Difficulty in walking, not elsewhere classified: Secondary | ICD-10-CM

## 2023-03-05 DIAGNOSIS — M25651 Stiffness of right hip, not elsewhere classified: Secondary | ICD-10-CM

## 2023-03-05 NOTE — Therapy (Signed)
OUTPATIENT PHYSICAL THERAPY TREATMENT NOTE     Patient Name: Molly Giles MRN: [id number] DOB:01/25/1956, 67 y.o., female Today's Date: 03/05/2023  END OF SESSION:  PT End of Session - 03/05/23 1539     Visit Number 11    Date for PT Re-Evaluation 04/19/23    Authorization Type Medicare A and B    Progress Note Due on Visit 20    PT Start Time 1336    PT Stop Time 1416    PT Time Calculation (min) 40 min    Activity Tolerance Patient tolerated treatment well    Behavior During Therapy [facility] for tasks assessed/performed              Past Medical History:  Diagnosis Date   Abdominal bloating 05/10/2021   Abdominal discomfort, generalized 05/07/2016   Acute gastritis 11/19/2020   Allergic rhinoconjunctivitis 07/02/2015   Anal fissure 11/19/2020   Anterior to posterior tear of superior glenoid labrum of left shoulder 02/22/2021   Arthritis    Benign positional vertigo 04/2015   Responded well to [facility]   Bruxism (teeth grinding)    Chronic contact dermatitis 08/06/2018   Per allergist Dr Edwyna Ready.    Chronic migraine 02/25/2016   Per Dr Lucia Gaskins review in notes from [facility] from September 2015. Showed total headache days last month 18. Severe headache days 7 days. Moderate headache days 5 days. Mild headache days last month sick days. Days without headache last month 10 days. Symptoms associated with photophobia, phonophobia, osmophobia, neck pain, dizziness, jaw pain, nasal congestion, vision disturbances, tingling and numbness, weakness and worsening with activity. Each headache attack last 3 hours depending on treatment in severity. Left side, the right side, easier side, the frontal area in the back of the head. Characterized as throbbing, pressure, tightness, squeezing, stabbing and burning    Chronic migraine without aura 05/15/2013    Dr Lucia Gaskins [facility]   Chronic tonsillitis 01/27/2019   DDD (degenerative disc  disease), cervical 11/18/2018   DDD (degenerative disc disease), lumbar    Depression    Disc displacement, lumbar    Dysphagia 10/11/2021   Dysrhythmia    seen by dr Donnie Aho- not a problem since she has been on Bystolic   Episodic cluster headache, not intractable 03/06/2017   Essential hypertension 05/15/2013   Family history of adverse reaction to anesthesia    Brother- N/V   Family history of premature CAD 05/15/2013   Fibromyalgia syndrome 07/01/2015   Management by Dr Kathie Rhodes. Devashwar (Rheum)    GERD (gastroesophageal reflux disease) 12/16/2014   H/O seasonal allergies    Hallux rigidus of right foot 09/25/2022   Hammer toe    Left great toe   Hashimoto's thyroiditis    Per patient, diagnosed by Dr. Ferdinand Cava   Hearing loss of both ears 07/27/2015   mild to borderline moderate low frequency hearing loss improving to within normal limits bilaterally on audiology testing at [facility] in November 2016.     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   Hyperlipidemia 1998   Hypokalemia due to excessive gastrointestinal loss of potassium 07/28/2019   Hypothyroidism    Impairment of balance  02/2015   Consequent of postconcussive syndrome   Injury of triangular fibrocartilage complex of left wrist 02/25/2019   Dx 02/25/19 Bradly Bienenstock IV MD ([facility])   Insulin resistance 07/04/2017   Internal hemorrhoid 01/28/2021   Internal hemorrhoid seen on colonoscopy 10/2020 Roderic Scarce MD [facility])   Interstitial cystitis    Irritable bowel syndrome with diarrhea 04/03/2016   Left ventricular hypertrophy, mild 02/25/2016   ECHOcardiogram report 06/17/15 showing EF55-60%, mild LVH and  G1DD    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HCC) 03/10/2018   [facility]   Lumbar facet joint pain    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   Mood disorder (HCC)    Morbid obesity (HCC) 03/06/2017   Musculoskeletal neck pain 07/14/2015   Nocturnal hypoxemia 03/06/2017   Normal coronary arteries 05/14/2014   Obesity (BMI 30.0-34.9) 01/29/2017   Odynophagia 12/20/2021   Osteoarthritis of left hip 11/01/2015   MRI order by Dr Charlann Boxer (ortho) 10/2015 showed significant arthritis of left hip joint with cystic changes in femoral head c/w osteoarthritis   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, lumbar region 10/30/2011   Other insomnia 11/09/2016   Overweight    Pain in joint of left shoulder 11/09/2016   Pain in joint, multiple sites 11/18/2018   Palpitations 05/15/2013   [facility] manages   Perianal candidiasis 12/20/2021   Periodic limb movement sleep disorder 03/28/2017   Perirectal cyst 05/07/2016   Poison ivy dermatitis 03/24/2021   PONV (postoperative nausea and vomiting)    Positive ANA (antinuclear antibody) 11/18/2018   Post concussion syndrome 06/06/2015   Post concussive syndrome 07/14/2015   Ms Rue's post-concussive syndrome manifesting in vertigo and headache, mood changes, poor balance, dizziness, and decreased concentration per Dr Lucia Gaskins at [facility].    Posterior vitreous detachment of right eye 2014   Premature menopause 12/20/2021   Rectal abscess 05/10/2021   S/P left THA, AA 07/25/2016   Sacroiliac inflammation (HCC) 08/18/2021   Shingles    Shortness of breath dyspnea    with exertion   Sicca syndrome (HCC) 11/18/2018   (+) ANA   Sleep walking and eating 03/06/2017   Snoring 03/06/2017   Spondylosis of lumbar region without myelopathy or radiculopathy 10/30/2011   TFC (triangular fibrocartilage complex) injury 02/25/2019   Thyroid nodule 08/11/2009   Findings: The thyroid gland is within normal limits in size.  The gland  is diffusely inhomogeneous. A small solid nodule is noted in the lower pole  medially on the right of 7 x 6 x 8 mm. A small solid nodule is noted inferiorly on the left of 3 x 3 x 4 mm.  IMPRESSION:  The thyroid gland is within normal limits in size with only small solid nodules present, the largest of only 8 mm in diameter on the right.     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    Trochanteric bursitis, right hip 04/26/2020   Tubular adenoma of colon 01/28/2021   Colonoscopy screening 4 mm tubular Adenoma polyp Charlott Rakes, MD [facility])   Tubular adenoma of colon 01/28/2021   Colonoscopy screening 4 mm tubular Adenoma polyp Charlott Rakes, MD [facility])   Vasomotor symptoms due to menopause 04/19/2017   Vitamin D deficiency 05/08/2017   Vulvitis 07/22/2020   Yeast vaginitis 07/11/2019   Past Surgical History:  Procedure Laterality Date   arthroscopy Left 01/2022   with SAD and DCR, K. Supple MD  Bladder dilitation     x 3   BREAST BIOPSY Right 2011   Benign histology   CARDIOVASCULAR STRESS TEST  2000   Unremarkable per pt report   CARPOMETACARPAL JOINT ARTHROTOMY Right 2011   COLONOSCOPY     COLONOSCOPY WITH PROPOFOL N/A 04/21/2015   Procedure: COLONOSCOPY WITH PROPOFOL;  Surgeon: Jeani Hawking, MD;  Location[facility];  Service: Endoscopy;  Laterality: N/A;   CYSTOSCOPY W/ DILATION OF BLADDER N/A    EPIDURAL BLOCK INJECTION Left 04/12/2016   Left Medial Nerve Block and Left L5 ramus block, Dr Sheran Luz    EPIDURAL BLOCK INJECTION  03/21/2016   Left L3-4 medial branch block and Left L5 & dorsal ramus block    EPIDURAL BLOCK INJECTION N/A 10/25/2016   Sheran Luz, MD. Lumbar medial branch block   EPIDURAL BLOCK INJECTION N/A 02/09/2017   Sheran Luz, MD.  Bilateral L3/4 medial branch block, bilateral L5 dorsal ramus block   EPIDURAL BLOCK INJECTION N/A 07/04/2017   Sheran Luz, MD   FECAL  TRANSPLANT  04/21/2015   Procedure: FECAL TRANSPLANT;  Surgeon: Jeani Hawking, MD;  Location: [facility] ENDOSCOPY;  Service: Endoscopy;;   HIP ARTHROPLASTY Left    HIP ARTHROSCOPY Left 03/06/2018   Left hip arthroplasty, redo for loose hip arthroplasty. Procedure at [facility] hospital   INJECTION HIP INTRA ARTICULAR Left 11/2015   for OA by Dr Vilma Prader IMPLANT PLACEMENT  04/2021   Floyde Parkins MD ([facility])   INTERSTIM IMPLANT REVISION N/A 09/2021   Floyde Parkins MD ([facility])   OTHER SURGICAL HISTORY Left 2016   Left L3/L4 medial nerve block and Left L5 Dorsal Ramus block Dr Rosine Abe   TOTAL HIP ARTHROPLASTY Left 07/25/2016   Procedure: LEFT TOTAL HIP ARTHROPLASTY ANTERIOR APPROACH;  Surgeon: Durene Romans, MD;  Location: [facility];  Service: Orthopedics;  Laterality: Left;   Patient Active Problem List   Diagnosis Date Noted   Rash 02/07/2023   BMI 29.0-29.9,adult Current BMI 29.9 02/07/2023   Low serum vitamin B12 01/10/2023   OSA (obstructive sleep apnea) 12/27/2022   BMI 30.0-30.9,adult 12/27/2022   Severe obstructive sleep apnea-hypopnea syndrome 12/22/2022   Hyperglycemia 12/11/2022   Depression screening 12/11/2022   Generalized obesity 12/11/2022   SOBOE (shortness of breath on exertion) 12/11/2022   Other fatigue 12/11/2022   Sleep related headaches 11/07/2022   Intractable episodic cluster headache 11/07/2022   Sleep walking and eating 11/07/2022   Ambien use disorder, 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   Psychophysiological insomnia 11/07/2022   Rhinitis 11/03/2022   Hallux rigidus of right foot 09/25/2022   Morton neuroma, right 09/25/2022   Iliopsoas bursitis of left hip 05/08/2022   Chronic pain of right knee 05/08/2022   Arthrosis of hand 12/20/2021   History of Clostridioides difficile colitis, required fecal transplantation 12/20/2021   Mixed hyperlipidemia 12/20/2021   Meibomian gland dysfunction (MGD) of both eyes 10/17/2021   Nuclear  sclerosis of both eyes 10/17/2021   Epiretinal membrane (ERM) of left eye 10/17/2021   OAB (overactive bladder) 03/02/2021   Osteoarthritis of acromioclavicular joint 02/22/2021   Hyperhidrosis, scalp, 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   Chronic low back pain 05/14/2019   Cervical spondylosis 11/18/2018   DDD (degenerative disc disease), lumbar 11/18/2018   Keratoconjunctivitis sicca (HCC) 11/18/2018   Chronic contact dermatitis 08/06/2018   Primary osteoarthritis of left knee 06/20/2018   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HCC) 11/29/2017   Insulin resistance 07/04/2017   Vitamin D deficiency 05/08/2017  Periodic limb movement sleep disorder 03/28/2017   Obesity (BMI 30.0-34.9) 01/29/2017   Other insomnia 11/09/2016   Irritable bowel syndrome with diarrhea 04/03/2016   Left ventricular hypertrophy, mild 02/25/2016   Mood disorder (HCC) 07/02/2015   Allergic rhinoconjunctivitis 07/02/2015   Fibromyalgia syndrome 07/01/2015   Personal history of colonic polyps 07/01/2015   GERD (gastroesophageal reflux disease) 12/16/2014   Hypothyroidism 05/15/2013   Pure hypercholesterolemia 05/15/2013   Chronic interstitial cystitis 05/15/2013   Chronic migraine without aura 05/15/2013    PCP: McDiarmid, Leighton Roach, MD   REFERRING PROVIDER: Molly Maduro, MD   REFERRING DIAG: M25.551 Pain in right hip  THERAPY DIAG:  Pain in right hip  Stiffness of right hip, not elsewhere classified  Muscle weakness (generalized)  Cramp and spasm  Difficulty in walking, not elsewhere classified  Rationale for Evaluation and Treatment: Rehabilitation  ONSET DATE: 12/11/22  SUBJECTIVE:   SUBJECTIVE STATEMENT: Pt reports that she is still having increased pain in her hip, especially with walking.  PERTINENT HISTORY: Left hip THA PAIN:  02/22/23: Are you having pain?  4-5/10 Pain description:  sharp with walking Rt posterior- lateral hip, aggravated when hip is tired at the end of the day Pain is better at the  start of the day.    PRECAUTIONS: None  WEIGHT BEARING RESTRICTIONS: No  FALLS:  Has patient fallen in last 6 months? No  LIVING ENVIRONMENT: Lives with: lives with their spouse Lives in: House/apartment   OCCUPATION: Retired, she has sleep apnea and tends to sleep in, gets up to do housework, goes out with friends.  She was walking her dog but unable currently due to the pain.   PLOF: Independent, Independent with basic ADLs, Independent with household mobility without device, Independent with community mobility without device, Independent with homemaking with ambulation, Independent with gait, and Independent with transfers  PATIENT GOALS: To get out of pain and be able to walk and be more active  NEXT MD VISIT: prn  OBJECTIVE:   DIAGNOSTIC FINDINGS:  Impression: Ultrasound Undersurface fraying and probable low-grade partial tearing of the  distal gluteus minimus tendon at the greater trochanter with  moderate subgluteus bursitis.  PATIENT SURVEYS:  Eval:  LEFS: 33/80  COGNITION: Overall cognitive status: Within functional limits for tasks assessed     SENSATION: WFL   MUSCLE LENGTH: Hamstrings: Right 50 deg; Left 60 deg Thomas test: Right Positive; Left Positive  POSTURE: No Significant postural limitations  PALPATION: Tender over lateral right hip at greater trochanter  LOWER EXTREMITY ROM:  [facility]  LOWER EXTREMITY MMT:  MMT Right eval Right 02/22/23 Left eval Left 02/22/23  Hip flexion 4+ 4+ 4+ 4+  Hip extension 3+ 4- 4 4  Hip abduction 3+ 4- 3+ 4-  Hip adduction 5 5 5 5   Hip internal rotation 4- 4 4- 4-  Hip external rotation 4- 4 4- 4   LOWER EXTREMITY SPECIAL TESTS:  Hip special tests: Luisa Hart (FABER) test: positive , Trendelenburg test: positive , Thomas test: positive , and Ober's test: positive   FUNCTIONAL TESTS:  Eval: 5 times sit to stand: 11.67 sec Timed up and go (TUG): 8.57 sec  02/22/23: 5 times sit to stand: 10.10 sec Timed up  and go (TUG): 6.58 sec  GAIT: Distance walked: had worked up to approx 4 blocks Assistive device utilized:  oc and None Level of assistance: Complete Independence Comments: antalgic/trendelenburg   TODAY'S TREATMENT:      DATE: 03/05/2023 Nustep level 5 x6 min with  PT present to discuss status Seated hamstring stretch 2x20 sec bilat Seated piriformis stretch to right side 2x20 sec.  Attempted left side, but pt had discomfort, so stopped. Seated hip abduction with yellow loop 2x10 Side stepping with yellow loop around ankles 4x15 ft bilat Supine 90/90 heel tap 2x5 with ab soreness reported Supine 90/90 isometric contraction 2x20 sec Supine bridge 2x10 with 2 sec hold Sidelying reverse clamshell x10 bilat (stopped at one set secondary to pain) Sidelying clamshell 2x10 bilat (with cuing for decreased pacing and proper hip stacking/positioning) Manual Therapy with patient in left sidelying to access right IT band, piriformis/glute with Addaday   DATE: 02/22/23 Recert and 10th visit completed Side lying rolling of right IT band Side lying addaday to right IT band and glutes Seated clam x 20 with yellow loop Supine bridge with clam x 20 with yellow loop  Side lying clam right side with yellow loop Side lying hip IR with blue loop x 10 each side Ice to right hip post treatment x 10 min  DATE: 02/15/23 Nustep x 5 min level 6 -PT present to discuss progress  Seated hamstring stretch 3 x 30 seconds Squats x 10 to chair Lateral band walks with yellow loop x 10 steps each way, 3 laps Seated clam x 20 with yellow loop Supine bridge with clam x 20 with yellow loop  Side lying clam right side with yellow loop Side lying hip IR with blue loop x 10 each side PPT x 20 PPT with 90/90 heel tap x 20 PPT with dying bug x 20 Hip matrix x 20 each on each LE hip abduction and hip extension 45 lbs  DATE: 02/13/23 Nustep x 5 min level 6 -PT present to discuss progress  Hip matrix x 20 each on each  LE hip abduction and hip extension 30 lbs Lateral band walks with yellow loop x 10 steps each way, 3 laps Standing hip abduction in slight squat position with blue loop x 20 Seated clam x 20 with yellow loop Supine bridge with clam x 20 with yellow loop  Side lying clam right side with yellow loop Side lying hip IR with blue loop x 10 each side Squats x 10 to chair Cone touches 3 x 10 each LE  Single leg dead lifts 10# dumbell to touch tall cone 2 sets of 10  Lunge to BOSU fwd x 10 each LE, then side x 10 each LE PPT x 20 PPT with 90/90 heel tap x 20 PPT with dying bug x 20                                                                                                                     PATIENT EDUCATION:  Education details: Initiated HEP and educated on leg length discrepency vs muscle imbalance Person educated: Patient Education method: Programmer, multimedia, Facilities manager, Verbal cues, and Handouts Education comprehension: verbalized understanding, returned demonstration, and verbal cues required  HOME EXERCISE PROGRAM: Access Code: [email or web address] URL: [email or web address] Date: 02/13/2023 Prepared  by: Mikey Kirschner  Exercises - Sidelying Hip Abduction  - 1 x daily - 7 x weekly - 2 sets - 10 reps - Clam with Resistance  - 1 x daily - 7 x weekly - 2 sets - 10 reps - Sidelying Reverse Clamshell with Resistance  - 1 x daily - 7 x weekly - 2 sets - 10 reps - Prone Hip Extension  - 1 x daily - 7 x weekly - 2 sets - 10 reps - Standing Hamstring Stretch on Chair  - 1 x daily - 7 x weekly - 1 sets - 3 reps - 30 sec hold - Standing Hip Extension with Counter Support  - 2 x daily - 7 x weekly - 2 sets - 10 reps - Standing Hip Abduction with Anterior Support  - 2 x daily - 7 x weekly - 2 sets - 10 reps - Sit to Stand Without Arm Support  - 2 x daily - 7 x weekly - 2 sets - 10 reps - Staggered Stance Step Throughs (Mirrored)  - 1 x daily - 7 x weekly - 2 sets - 5 reps - Squat with  Chair Touch  - 1 x daily - 7 x weekly - 2 sets - 5 reps - Half Deadlift with Kettlebell  - 1 x daily - 7 x weekly - 2 sets - 5 reps - Single Leg Cone Touch  - 1 x daily - 7 x weekly - 1 sets - 10 reps - Forward Reach with Kettlebell  - 1 x daily - 7 x weekly - 1 sets - 10 reps - Supine Posterior Pelvic Tilt  - 1 x daily - 7 x weekly - 1 sets - 20 reps - Supine 90/90 Alternating Heel Touches with Posterior Pelvic Tilt  - 1 x daily - 7 x weekly - 1 sets - 20 reps - Supine Dead Bug with Leg Extension  - 1 x daily - 7 x weekly - 1 sets - 20 reps  ASSESSMENT:  CLINICAL IMPRESSION: Today's session focused on hamstring flexibility and hip extension/abduction neuromuscular rehabilitation exercises. Patient responded well to the piriformis stretch reporting reduced pain on involved side however did not continue the exercise on the contralateral side due to reported pain with concurrent surgical hx of THA. Patient did require verbal and tactile cuing during the clamshell exercise for proper trunk alignment and hip stacking in addition to continued verbal cueing in regard to pace during repetitions.   OBJECTIVE IMPAIRMENTS: Abnormal gait, difficulty walking, decreased ROM, decreased strength, increased fascial restrictions, increased muscle spasms, impaired flexibility, and pain.   ACTIVITY LIMITATIONS: bending, sitting, squatting, stairs, transfers, bed mobility, and dressing  PARTICIPATION LIMITATIONS: meal prep, cleaning, laundry, driving, shopping, community activity, and yard work  PERSONAL FACTORS: Age, Fitness, and Past/current experiences are also affecting patient's functional outcome.   REHAB POTENTIAL: Good  CLINICAL DECISION MAKING: Stable/uncomplicated  EVALUATION COMPLEXITY: Low   GOALS: Goals reviewed with patient? Yes  SHORT TERM GOALS: Target date: 01/29/2023  Pain report to be no greater than 4/10  Baseline: Goal status: MET  2.  Patient will be independent with initial HEP   Baseline:  Goal status: MET   LONG TERM GOALS: Target date: 02/26/2023  Patient to report pain no greater than 2/10  Baseline:  Goal status: MET 02/15/23  2.  Patient to be independent with advanced HEP  Baseline:  Goal status: MET 02/15/23  3.  Patient to be able to sleep through the night  Baseline:  Goal status: IN PROGRESS  4.  Patient to be able to stand or walk for at least 15 min without right hip pain Baseline:  Goal status: MET 02/15/23  5.  Functional scores to improve by 1-3 seconds Baseline:  Goal status: INITIAL  6.  Patient to report 85% improvement in overall symptoms  Baseline:  Goal status: INITIAL   PLAN:  PT FREQUENCY: 1x/week  PT DURATION: an additional  8 weeks  PLANNED INTERVENTIONS: Therapeutic exercises, Therapeutic activity, Neuromuscular re-education, Balance training, Gait training, Patient/Family education, Self Care, Joint mobilization, Stair training, DME instructions, Aquatic Therapy, Dry Needling, Electrical stimulation, Spinal mobilization, Cryotherapy, Moist heat, Taping, Traction, Ultrasound, Ionotophoresis 4mg /ml Dexamethasone, Manual therapy, and Re-evaluation  PLAN FOR NEXT SESSION:  Nustep, review exercises added to HEP, focus on bilateral hip stability training and core strength.  DN/Manual therapy as indicated   Reather Laurence, PT 03/05/23 4:26 PM   [facility] [address] [place], [place] [address] Phone # [phone] Fax [phone]

## 2023-03-06 ENCOUNTER — Other Ambulatory Visit (HOSPITAL_COMMUNITY): Payer: Self-pay | Admitting: Psychiatry

## 2023-03-06 ENCOUNTER — Telehealth: Payer: Self-pay | Admitting: Family Medicine

## 2023-03-06 MED ORDER — RIZATRIPTAN BENZOATE 10 MG PO TABS: 10.0000 mg | ORAL_TABLET | ORAL | 2 refills | Status: DC | PRN

## 2023-03-06 NOTE — Telephone Encounter (Signed)
Pt requesting refill for rizatriptan (MAXALT) 10 MG tablet. Pharmacy [id number]  

## 2023-03-06 NOTE — Telephone Encounter (Signed)
Reviewed pt chart. Rizaptriptan was changed to eletriptan at last visit with Amy Lomax,NP. However, pt sent mychart later stating cost high for eletriptan and went back to rizatriptan d/t not seeing big difference in therapy.   I e-scribed rizatriptan refill to pharmacy.

## 2023-03-07 ENCOUNTER — Encounter (INDEPENDENT_AMBULATORY_CARE_PROVIDER_SITE_OTHER): Payer: Self-pay | Admitting: Family Medicine

## 2023-03-07 ENCOUNTER — Ambulatory Visit (INDEPENDENT_AMBULATORY_CARE_PROVIDER_SITE_OTHER): Payer: Medicare Other | Admitting: Family Medicine

## 2023-03-07 VITALS — BP 115/82 | HR 111 | Temp 98.0°F | Ht 66.0 in | Wt 182.0 lb

## 2023-03-07 DIAGNOSIS — E559 Vitamin D deficiency, unspecified: Secondary | ICD-10-CM

## 2023-03-07 DIAGNOSIS — R632 Polyphagia: Secondary | ICD-10-CM | POA: Insufficient documentation

## 2023-03-07 DIAGNOSIS — E669 Obesity, unspecified: Secondary | ICD-10-CM

## 2023-03-07 DIAGNOSIS — E88819 Insulin resistance, unspecified: Secondary | ICD-10-CM | POA: Diagnosis not present

## 2023-03-07 DIAGNOSIS — Z6829 Body mass index (BMI) 29.0-29.9, adult: Secondary | ICD-10-CM

## 2023-03-07 MED ORDER — VITAMIN D (ERGOCALCIFEROL) 1.25 MG (50000 UNIT) PO CAPS: 50000.0000 [IU] | ORAL_CAPSULE | ORAL | 0 refills | Status: DC

## 2023-03-07 MED ORDER — ZEPBOUND 2.5 MG/0.5ML ~~LOC~~ SOAJ
2.5000 mg | SUBCUTANEOUS | 0 refills | Status: DC
Start: 2023-03-07 — End: 2023-03-13

## 2023-03-08 ENCOUNTER — Telehealth (INDEPENDENT_AMBULATORY_CARE_PROVIDER_SITE_OTHER): Payer: Self-pay | Admitting: Family Medicine

## 2023-03-08 ENCOUNTER — Telehealth (INDEPENDENT_AMBULATORY_CARE_PROVIDER_SITE_OTHER): Payer: Self-pay | Admitting: *Deleted

## 2023-03-08 DIAGNOSIS — R632 Polyphagia: Secondary | ICD-10-CM

## 2023-03-08 NOTE — Telephone Encounter (Signed)
Pt called in to say that her Medicare will not cover the Zepbound at her local pharmacy [facility]) and is asking for a paper script to take to pharmacy of her choice.  VW

## 2023-03-08 NOTE — Progress Notes (Signed)
Chief Complaint:   OBESITY Molly Giles is here to discuss her progress with her obesity treatment plan along with follow-up of her obesity related diagnoses. Molly Giles is on the Category 1 Plan and states she is following her eating plan approximately 90-95% of the time. Molly Giles states she is doing stretches and walking for 40 minutes 2 times per week.  Today's visit was #: 7 Starting weight: 199 lbs Starting date: 12/11/2022 Today's weight: 182 lbs Today's date: 03/07/2023 Total lbs lost to date: 17 Total lbs lost since last in-office visit: 3  Interim History: Patient continues to do well with her weight loss.  She had extra temptations.  She is doing well on her category 1 plan.  She is doing physical therapy for her hip.    Subjective:   1. Vitamin D deficiency Patient is on Vitamin D, and she denies nausea, vomiting, or muscle weakness.   2. Insulin resistance Patient's rash from metformin has returned, so she stopped.   3. Polyphagia Patient is unable to take metformin, and she has a lot of questions about GLP-1 and weight loss.   Assessment/Plan:   1. Vitamin D deficiency Patient will continue prescription Vitamin D, and we will refill for 1 month.   - Vitamin D, Ergocalciferol, (DRISDOL) 1.25 MG (50000 UNIT) CAPS capsule; Take 1 capsule (50,000 Units total) by mouth every 7 (seven) days.  Dispense: 5 capsule; Refill: 0  2. Insulin resistance Patient is to hold metformin for now, and we will continue to monitor.  May need additional prednisone if rash worsens, use OTC hydrocortisone twice daily for now.  3. Polyphagia Patient agreed to start Zepbound 2.5 mg once weekly with no refills. All of the patient's questions were answered in depth.   - tirzepatide (ZEPBOUND) 2.5 MG/0.5ML Pen; Inject 2.5 mg into the skin once a week.  Dispense: 2 mL; Refill: 0  4. BMI 29.0-29.9,adult Current BMI 29.9  5. Obesity, Beginning BMI 32.1 Molly Giles is currently in the action stage of change.  As such, her goal is to continue with weight loss efforts. She has agreed to the Category 1 Plan.   Exercise goals: As is.   Behavioral modification strategies: increasing lean protein intake.  Molly Giles has agreed to follow-up with our clinic in 4 weeks. She was informed of the importance of frequent follow-up visits to maximize her success with intensive lifestyle modifications for her multiple health conditions.   Objective:   Blood pressure 115/82, pulse (!) 111, temperature 98 F (36.7 C), height 5\' 6"  (1.676 m), weight 182 lb (82.6 kg), SpO2 95 %. Body mass index is 29.38 kg/m.  Lab Results  Component Value Date   CREATININE 0.88 12/11/2022   BUN 16 12/11/2022   NA 138 12/11/2022   K 4.0 12/11/2022   CL 103 12/11/2022   CO2 19 (L) 12/11/2022   Lab Results  Component Value Date   ALT 20 12/11/2022   AST 19 12/11/2022   ALKPHOS 81 12/11/2022   BILITOT 0.3 12/11/2022   Lab Results  Component Value Date   HGBA1C 5.3 12/11/2022   HGBA1C 5.2 06/26/2019   HGBA1C 5.0 02/07/2018   HGBA1C 5.2 07/04/2017   HGBA1C 5.3 03/22/2017   Lab Results  Component Value Date   INSULIN 7.8 12/11/2022   INSULIN 8.3 05/07/2018   INSULIN 11.8 07/04/2017   INSULIN 10.5 03/22/2017   Lab Results  Component Value Date   TSH 4.960 (H) 12/11/2022   Lab Results  Component Value Date  CHOL 176 12/11/2022   HDL 54 12/11/2022   LDLCALC 101 (H) 12/11/2022   TRIG 117 12/11/2022   CHOLHDL 3.3 10/11/2021   Lab Results  Component Value Date   VD25OH 41.2 12/11/2022   VD25OH 43.3 05/07/2018   VD25OH 48.5 07/04/2017   Lab Results  Component Value Date   WBC 6.4 08/04/2020   HGB 13.5 08/04/2020   HCT 41 08/04/2020   MCV 95.0 07/05/2019   PLT 262 08/04/2020   No results found for: "IRON", "TIBC", "FERRITIN"  Attestation Statements:   Reviewed by clinician on day of visit: allergies, medications, problem list, medical history, surgical history, family history, social history, and  previous encounter notes.  Time spent on visit including pre-visit chart review and post-visit care and charting was 40 minutes.   I, Burt Knack, am acting as transcriptionist for Quillian Quince, MD.  I have reviewed the above documentation for accuracy and completeness, and I agree with the above. -  Quillian Quince, MD

## 2023-03-08 NOTE — Telephone Encounter (Signed)
PA SUBMITTED FOR COVERMYMEDS.   Molly Giles (Key: [id number])  [name] [place] is processing your PA request and will respond shortly with next steps. You may close this dialog, return to your dashboard, and perform other tasks. To check for an update later, open this request again from your dashboard.  If you need assistance, please chat with CoverMyMeds or call us at [phone].

## 2023-03-12 NOTE — Telephone Encounter (Signed)
Questions answered from prior authorization from cover my meds. Waiting on determination.

## 2023-03-13 MED ORDER — ZEPBOUND 2.5 MG/0.5ML ~~LOC~~ SOAJ
2.5000 mg | SUBCUTANEOUS | 0 refills | Status: AC
Start: 2023-03-13 — End: ?

## 2023-03-13 NOTE — Telephone Encounter (Signed)
Denied. We denied this request under Medicare Part D because the Social Security Act permits the exclusion of certain drugs or classes of drugs from coverage under Part D. Zepbound is a drug used for weight loss, which is one of the excluded classes of drugs listed in the Social Security Act. Because Zepbound is excluded from coverage under this Act, as well as listed as an exclusion in your Evidence of Coverage document, we are denying your request.  Patient sent Mychart message

## 2023-03-13 NOTE — Addendum Note (Signed)
Addended by: Verlon Setting on: 03/13/2023 03:39 PM   Modules accepted: Orders

## 2023-03-13 NOTE — Telephone Encounter (Signed)
Please give patient a call back.    Thanks!

## 2023-03-13 NOTE — Telephone Encounter (Signed)
Left patient a message to call back.

## 2023-03-13 NOTE — Telephone Encounter (Signed)
Phone call to patient she does want to pay cash for her Zepbound and wants a printed prescription to take to the pharmacy of her choice. Ok per Dr. Dalbert Garnet.

## 2023-03-19 ENCOUNTER — Ambulatory Visit (INDEPENDENT_AMBULATORY_CARE_PROVIDER_SITE_OTHER): Payer: Medicare Other | Admitting: Family Medicine

## 2023-03-19 ENCOUNTER — Encounter: Payer: Self-pay | Admitting: Family Medicine

## 2023-03-19 ENCOUNTER — Ambulatory Visit (HOSPITAL_COMMUNITY)
Admission: RE | Admit: 2023-03-19 | Discharge: 2023-03-19 | Disposition: A | Discharge status: Home / Self Care | Payer: Medicare Other | Source: Ambulatory Visit | Attending: Family Medicine | Admitting: Family Medicine

## 2023-03-19 VITALS — BP 127/79 | HR 103 | Ht 66.0 in | Wt 183.6 lb

## 2023-03-19 DIAGNOSIS — R42 Dizziness and giddiness: Secondary | ICD-10-CM | POA: Insufficient documentation

## 2023-03-19 DIAGNOSIS — B029 Zoster without complications: Secondary | ICD-10-CM | POA: Insufficient documentation

## 2023-03-19 HISTORY — DX: Dizziness and giddiness: R42

## 2023-03-19 MED ORDER — PREDNISONE 10 MG (21) PO TBPK
ORAL_TABLET | ORAL | 0 refills | Status: DC
Start: 2023-03-19 — End: 2023-04-27

## 2023-03-19 MED ORDER — VALACYCLOVIR HCL 1 G PO TABS
1000.00 mg | ORAL_TABLET | Freq: Three times a day (TID) | ORAL | 0 refills | Status: AC
Start: 2023-03-19 — End: 2023-03-26

## 2023-03-19 NOTE — Assessment & Plan Note (Signed)
Inside R nare. With prolonged course, age, presentation along V1 dermatome, and possible associated vertigo warrants treatment with antiviral, steroids though >72 hours since onset. No current ocular or otic lesions and no significant neurological deficits to warrant inpatient treatment however strict return and emergency precautions discussed should symptoms worsen.

## 2023-03-19 NOTE — Assessment & Plan Note (Addendum)
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 EKG obtained given tachycardia and report of diaphoresis, no changes from previous. Neuro exam benign, no concern for primary CNS process or disseminated VZV infection. Referred to vestibular PT. Steroids and antiviral as below. Strict return and emergency precautions discussed.

## 2023-03-19 NOTE — Progress Notes (Signed)
    SUBJECTIVE:   CHIEF COMPLAINT / HPI:   DIZZINESS - wasn't feeling well about 1 week ago, went shopping and had trouble with changing clothes, became hot, sweaty, stumbled backwards and subsequently with episode of room spinning. Since then has experienced constant room spinning made worse with movement. Has h/o vertigo and has been doing vertigo exercises with some improvement.  Duration: 1 week Description of symptoms: room spinning Duration of episode:  constant with intermittent worsening with movement Dizziness frequency: recurrent Provoking factors:  none Aggravating factors:   movement Triggered by rolling over in bed:  some Triggered by bending over: yes Aggravated by head movement: yes Recent head injury: no Recent or current viral symptoms: no History of vasovagal episodes: no Nausea: yes Vomiting: no Tinnitus: no Hearing loss: no Headache: yes Unsteady gait: yes Diplopia, dysarthria, dysphagia or weakness: no Diaphoresis: yes Dyspnea: no Chest pain: no  Rash - noticed itchy and painful bumps inside R nare for the past few weeks. Previously has had "cold sores" around lateral edge of L nare, used abreva with resolution. Currently using abreva with no improvement. Denies rash, itching, pain in or around eyes, face, ear. Took steroids ~3 weeks ago for eczema flare. Otherwise denies recent illnesses, stress, med changes.  OBJECTIVE:   BP 127/79   Pulse (!) 103   Ht 5\' 6"  (1.676 m)   Wt 183 lb 9.6 oz (83.3 kg)   SpO2 98%   BMI 29.63 kg/m   Gen: well appearing, in NAD HEENT:  vesicular/pustular lesions to inside lateral edge of R nare. No lesions to tip of nose, in or around b/l eye. R TM visible without bulging, erythema, purulence, no lesions in canal.  Card: RRR Lungs: CTAB Ext: WWP, no edema Neuro: Alert and oriented, speech normal.  Optic field normal. Extraocular movements intact.  Hearing grossly intact bilaterally.  Smile symmetric without facial droop  or paralysis. Gilberto Better without nystagmus but with reproduction of symptoms.  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 EKG obtained given tachycardia and report of diaphoresis, no changes from previous. Neuro exam benign, no concern for primary CNS process or disseminated VZV infection. Referred to vestibular PT. Steroids and antiviral as below. Strict return and emergency precautions discussed.  Shingles Inside R nare. With prolonged course, age, presentation along V1 dermatome, and possible associated vertigo warrants treatment with antiviral, steroids though >72 hours since onset. No current ocular or otic lesions and no significant neurological deficits to warrant inpatient treatment however strict return and emergency precautions discussed should symptoms worsen.      Caro Laroche, DO

## 2023-03-19 NOTE — Patient Instructions (Addendum)
It was great to see you!  Our plans for today:  - Take the steroids and antiviral as prescribed. - If you notice any vision changes or rash around your eye, see your eye doctor immediately.  - We referred you to physical therapy for your vertigo, call if you don't hear about an appointment soon.   Take care and seek immediate care sooner if you develop any concerns.   Dr. Linwood Dibbles

## 2023-03-21 ENCOUNTER — Ambulatory Visit: Payer: Medicare Other | Attending: Physician Assistant

## 2023-03-21 ENCOUNTER — Ambulatory Visit (INDEPENDENT_AMBULATORY_CARE_PROVIDER_SITE_OTHER): Payer: Medicare Other | Admitting: Family Medicine

## 2023-03-21 DIAGNOSIS — M25651 Stiffness of right hip, not elsewhere classified: Secondary | ICD-10-CM | POA: Diagnosis present

## 2023-03-21 DIAGNOSIS — M25551 Pain in right hip: Secondary | ICD-10-CM | POA: Diagnosis present

## 2023-03-21 DIAGNOSIS — R252 Cramp and spasm: Secondary | ICD-10-CM | POA: Diagnosis present

## 2023-03-21 DIAGNOSIS — R262 Difficulty in walking, not elsewhere classified: Secondary | ICD-10-CM | POA: Insufficient documentation

## 2023-03-21 DIAGNOSIS — M6281 Muscle weakness (generalized): Secondary | ICD-10-CM | POA: Insufficient documentation

## 2023-03-21 DIAGNOSIS — M62838 Other muscle spasm: Secondary | ICD-10-CM | POA: Insufficient documentation

## 2023-03-21 NOTE — Therapy (Signed)
OUTPATIENT PHYSICAL THERAPY TREATMENT NOTE     Patient Name: Molly Giles MRN: [id number] DOB:Jun 09, 1956, 67 y.o., female Today's Date: 03/21/2023  END OF SESSION:  PT End of Session - 03/21/23 1219     Visit Number 12    Date for PT Re-Evaluation 04/19/23    Authorization Type Medicare A and B    Progress Note Due on Visit 20    PT Start Time 1145    PT Stop Time 1230    PT Time Calculation (min) 45 min    Activity Tolerance Patient tolerated treatment well    Behavior During Therapy [facility] : [facility] for tasks assessed/performed              Past Medical History:  Diagnosis Date   Abdominal bloating 05/10/2021   Abdominal discomfort, generalized 05/07/2016   Acute gastritis 11/19/2020   Allergic rhinoconjunctivitis 07/02/2015   Anal fissure 11/19/2020   Anterior to posterior tear of superior glenoid labrum of left shoulder 02/22/2021   Arthritis    Benign positional vertigo 04/2015   Responded well to Vestibular Rehab   Bruxism (teeth grinding)    Chronic contact dermatitis 08/06/2018   Per allergist Dr Edwyna Ready.    Chronic migraine 02/25/2016   Per Dr Lucia Gaskins review in notes from [facility] from September 2015. Showed total headache days last month 18. Severe headache days 7 days. Moderate headache days 5 days. Mild headache days last month sick days. Days without headache last month 10 days. Symptoms associated with photophobia, phonophobia, osmophobia, neck pain, dizziness, jaw pain, nasal congestion, vision disturbances, tingling and numbness, weakness and worsening with activity. Each headache attack last 3 hours depending on treatment in severity. Left side, the right side, easier side, the frontal area in the back of the head. Characterized as throbbing, pressure, tightness, squeezing, stabbing and burning    Chronic migraine without aura 05/15/2013    Dr Lucia Gaskins [facility]   Chronic tonsillitis 01/27/2019   DDD (degenerative disc  disease), cervical 11/18/2018   DDD (degenerative disc disease), lumbar    Depression    Disc displacement, lumbar    Dysphagia 10/11/2021   Dysrhythmia    seen by dr Donnie Aho- not a problem since she has been on Bystolic   Episodic cluster headache, not intractable 03/06/2017   Essential hypertension 05/15/2013   Family history of adverse reaction to anesthesia    Brother- N/V   Family history of premature CAD 05/15/2013   Fibromyalgia syndrome 07/01/2015   Management by Dr Kathie Rhodes. Devashwar (Rheum)    GERD (gastroesophageal reflux disease) 12/16/2014   H/O seasonal allergies    Hallux rigidus of right foot 09/25/2022   Hammer toe    Left great toe   Hashimoto's thyroiditis    Per patient, diagnosed by Dr. Ferdinand Cava   Hearing loss of both ears 07/27/2015   mild to borderline moderate low frequency hearing loss improving to within normal limits bilaterally on audiology testing at [facility] in November 2016.     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   Hyperlipidemia 1998   Hypokalemia due to excessive gastrointestinal loss of potassium 07/28/2019   Hypothyroidism    Impairment of balance  02/2015   Consequent of postconcussive syndrome   Injury of triangular fibrocartilage complex of left wrist 02/25/2019   Dx 02/25/19 Bradly Bienenstock IV MD ([facility])   Insulin resistance 07/04/2017   Internal hemorrhoid 01/28/2021   Internal hemorrhoid seen on colonoscopy 10/2020 Roderic Scarce MD [facility])   Interstitial cystitis    Irritable bowel syndrome with diarrhea 04/03/2016   Left ventricular hypertrophy, mild 02/25/2016   ECHOcardiogram report 06/17/15 showing EF55-60%, mild LVH and  G1DD    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HCC) 03/10/2018   [facility]   Lumbar facet joint pain    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   Mood disorder (HCC)    Morbid obesity (HCC) 03/06/2017   Musculoskeletal neck pain 07/14/2015   Nocturnal hypoxemia 03/06/2017   Normal coronary arteries 05/14/2014   Obesity (BMI 30.0-34.9) 01/29/2017   Odynophagia 12/20/2021   Osteoarthritis of left hip 11/01/2015   MRI order by Dr Charlann Boxer (ortho) 10/2015 showed significant arthritis of left hip joint with cystic changes in femoral head c/w osteoarthritis   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, lumbar region 10/30/2011   Other insomnia 11/09/2016   Overweight    Pain in joint of left shoulder 11/09/2016   Pain in joint, multiple sites 11/18/2018   Palpitations 05/15/2013   [facility] manages   Perianal candidiasis 12/20/2021   Periodic limb movement sleep disorder 03/28/2017   Perirectal cyst 05/07/2016   Poison ivy dermatitis 03/24/2021   PONV (postoperative nausea and vomiting)    Positive ANA (antinuclear antibody) 11/18/2018   Post concussion syndrome 06/06/2015   Post concussive syndrome 07/14/2015   Ms Novoa's post-concussive syndrome manifesting in vertigo and headache, mood changes, poor balance, dizziness, and decreased concentration per Dr Lucia Gaskins at [facility].    Posterior vitreous detachment of right eye 2014   Premature menopause 12/20/2021   Rectal abscess 05/10/2021   S/P left THA, AA 07/25/2016   Sacroiliac inflammation (HCC) 08/18/2021   Shingles    Shortness of breath dyspnea    with exertion   Sicca syndrome (HCC) 11/18/2018   (+) ANA   Sleep walking and eating 03/06/2017   Snoring 03/06/2017   Spondylosis of lumbar region without myelopathy or radiculopathy 10/30/2011   TFC (triangular fibrocartilage complex) injury 02/25/2019   Thyroid nodule 08/11/2009   Findings: The thyroid gland is within normal limits in size.  The gland  is diffusely inhomogeneous. A small solid nodule is noted in the lower pole  medially on the right of 7 x 6 x 8 mm. A small solid nodule is noted inferiorly on the left of 3 x 3 x 4 mm.  IMPRESSION:  The thyroid gland is within normal limits in size with only small solid nodules present, the largest of only 8 mm in diameter on the right.     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    Trochanteric bursitis, right hip 04/26/2020   Tubular adenoma of colon 01/28/2021   Colonoscopy screening 4 mm tubular Adenoma polyp Charlott Rakes, MD [facility])   Tubular adenoma of colon 01/28/2021   Colonoscopy screening 4 mm tubular Adenoma polyp Charlott Rakes, MD [facility])   Vasomotor symptoms due to menopause 04/19/2017   Vitamin D deficiency 05/08/2017   Vulvitis 07/22/2020   Yeast vaginitis 07/11/2019   Past Surgical History:  Procedure Laterality Date   arthroscopy Left 01/2022   with SAD and DCR, K. Supple MD  Bladder dilitation     x 3   BREAST BIOPSY Right 2011   Benign histology   CARDIOVASCULAR STRESS TEST  2000   Unremarkable per pt report   CARPOMETACARPAL JOINT ARTHROTOMY Right 2011   COLONOSCOPY     COLONOSCOPY WITH PROPOFOL N/A 04/21/2015   Procedure: COLONOSCOPY WITH PROPOFOL;  Surgeon: Jeani Hawking, MD;  Location: [facility] ENDOSCOPY;  Service: Endoscopy;  Laterality: N/A;   CYSTOSCOPY W/ DILATION OF BLADDER N/A    EPIDURAL BLOCK INJECTION Left 04/12/2016   Left Medial Nerve Block and Left L5 ramus block, Dr Sheran Luz    EPIDURAL BLOCK INJECTION  03/21/2016   Left L3-4 medial branch block and Left L5 & dorsal ramus block    EPIDURAL BLOCK INJECTION N/A 10/25/2016   Sheran Luz, MD. Lumbar medial branch block   EPIDURAL BLOCK INJECTION N/A 02/09/2017   Sheran Luz, MD.  Bilateral L3/4 medial branch block, bilateral L5 dorsal ramus block   EPIDURAL BLOCK INJECTION N/A 07/04/2017   Sheran Luz, MD   FECAL  TRANSPLANT  04/21/2015   Procedure: FECAL TRANSPLANT;  Surgeon: Jeani Hawking, MD;  Location: [facility] ENDOSCOPY;  Service: Endoscopy;;   HIP ARTHROPLASTY Left    HIP ARTHROSCOPY Left 03/06/2018   Left hip arthroplasty, redo for loose hip arthroplasty. Procedure at [facility] hospital   INJECTION HIP INTRA ARTICULAR Left 11/2015   for OA by Dr Vilma Prader IMPLANT PLACEMENT  04/2021   Floyde Parkins MD ([facility])   INTERSTIM IMPLANT REVISION N/A 09/2021   Floyde Parkins MD ([facility])   OTHER SURGICAL HISTORY Left 2016   Left L3/L4 medial nerve block and Left L5 Dorsal Ramus block Dr Rosine Abe   TOTAL HIP ARTHROPLASTY Left 07/25/2016   Procedure: LEFT TOTAL HIP ARTHROPLASTY ANTERIOR APPROACH;  Surgeon: Durene Romans, MD;  Location: [facility];  Service: Orthopedics;  Laterality: Left;   Patient Active Problem List   Diagnosis Date Noted   Vertigo 03/19/2023   Shingles 03/19/2023   Polyphagia 03/07/2023   Rash 02/07/2023   BMI 29.0-29.9,adult Current BMI 29.9 02/07/2023   Low serum vitamin B12 01/10/2023   OSA (obstructive sleep apnea) 12/27/2022   BMI 30.0-30.9,adult 12/27/2022   Severe obstructive sleep apnea-hypopnea syndrome 12/22/2022   Hyperglycemia 12/11/2022   Depression screening 12/11/2022   Generalized obesity 12/11/2022   SOBOE (shortness of breath on exertion) 12/11/2022   Other fatigue 12/11/2022   Sleep related headaches 11/07/2022   Intractable episodic cluster headache 11/07/2022   Sleep walking and eating 11/07/2022   Ambien use disorder, 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   Psychophysiological insomnia 11/07/2022   Rhinitis 11/03/2022   Hallux rigidus of right foot 09/25/2022   Morton neuroma, right 09/25/2022   Iliopsoas bursitis of left hip 05/08/2022   Chronic pain of right knee 05/08/2022   Arthrosis of hand 12/20/2021   History of Clostridioides difficile colitis, required fecal transplantation 12/20/2021   Mixed hyperlipidemia 12/20/2021    Meibomian gland dysfunction (MGD) of both eyes 10/17/2021   Nuclear sclerosis of both eyes 10/17/2021   Epiretinal membrane (ERM) of left eye 10/17/2021   OAB (overactive bladder) 03/02/2021   Osteoarthritis of acromioclavicular joint 02/22/2021   Hyperhidrosis, scalp, 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   Chronic low back pain 05/14/2019   Cervical spondylosis 11/18/2018   DDD (degenerative disc disease), lumbar 11/18/2018   Keratoconjunctivitis sicca (HCC) 11/18/2018   Chronic contact dermatitis 08/06/2018   Primary osteoarthritis of left knee 06/20/2018   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HCC)  11/29/2017   Insulin resistance 07/04/2017   Vitamin D deficiency 05/08/2017   Periodic limb movement sleep disorder 03/28/2017   Obesity (BMI 30.0-34.9) 01/29/2017   Other insomnia 11/09/2016   Irritable bowel syndrome with diarrhea 04/03/2016   Left ventricular hypertrophy, mild 02/25/2016   Mood disorder (HCC) 07/02/2015   Allergic rhinoconjunctivitis 07/02/2015   Fibromyalgia syndrome 07/01/2015   Personal history of colonic polyps 07/01/2015   GERD (gastroesophageal reflux disease) 12/16/2014   Hypothyroidism 05/15/2013   Pure hypercholesterolemia 05/15/2013   Chronic interstitial cystitis 05/15/2013   Chronic migraine without aura 05/15/2013    PCP: McDiarmid, Leighton Roach, MD   REFERRING PROVIDER: Molly Maduro, MD   REFERRING DIAG: M25.551 Pain in right hip  THERAPY DIAG:  Pain in right hip  Stiffness of right hip, not elsewhere classified  Muscle weakness (generalized)  Cramp and spasm  Difficulty in walking, not elsewhere classified  Rationale for Evaluation and Treatment: Rehabilitation  ONSET DATE: 12/11/22  SUBJECTIVE:   SUBJECTIVE STATEMENT: Pt reports that she has been experiencing severe vertigo.  She was unable to go on a trip to visit with family and attend a wedding.  Hip pain is controlled at the moment but walking distance about the same.   PERTINENT HISTORY: Left hip  THA PAIN:  02/22/23: Are you having pain?  4-5/10 Pain description:  sharp with walking Rt posterior- lateral hip, aggravated when hip is tired at the end of the day Pain is better at the start of the day.    PRECAUTIONS: None  WEIGHT BEARING RESTRICTIONS: No  FALLS:  Has patient fallen in last 6 months? No  LIVING ENVIRONMENT: Lives with: lives with their spouse Lives in: House/apartment   OCCUPATION: Retired, she has sleep apnea and tends to sleep in, gets up to do housework, goes out with friends.  She was walking her dog but unable currently due to the pain.   PLOF: Independent, Independent with basic ADLs, Independent with household mobility without device, Independent with community mobility without device, Independent with homemaking with ambulation, Independent with gait, and Independent with transfers  PATIENT GOALS: To get out of pain and be able to walk and be more active  NEXT MD VISIT: prn  OBJECTIVE:   DIAGNOSTIC FINDINGS:  Impression: Ultrasound Undersurface fraying and probable low-grade partial tearing of the  distal gluteus minimus tendon at the greater trochanter with  moderate subgluteus bursitis.  PATIENT SURVEYS:  Eval:  LEFS: 33/80  COGNITION: Overall cognitive status: Within functional limits for tasks assessed     SENSATION: WFL   MUSCLE LENGTH: Hamstrings: Right 50 deg; Left 60 deg Thomas test: Right Positive; Left Positive  POSTURE: No Significant postural limitations  PALPATION: Tender over lateral right hip at greater trochanter  LOWER EXTREMITY ROM:  [facility]  LOWER EXTREMITY MMT:  MMT Right eval Right 02/22/23 Left eval Left 02/22/23  Hip flexion 4+ 4+ 4+ 4+  Hip extension 3+ 4- 4 4  Hip abduction 3+ 4- 3+ 4-  Hip adduction 5 5 5 5   Hip internal rotation 4- 4 4- 4-  Hip external rotation 4- 4 4- 4   LOWER EXTREMITY SPECIAL TESTS:  Hip special tests: Luisa Hart (FABER) test: positive , Trendelenburg test: positive , Thomas  test: positive , and Ober's test: positive   FUNCTIONAL TESTS:  Eval: 5 times sit to stand: 11.67 sec Timed up and go (TUG): 8.57 sec  02/22/23: 5 times sit to stand: 10.10 sec Timed up and go (TUG): 6.58 sec  GAIT:  Distance walked: had worked up to approx 4 blocks Assistive device utilized:  oc and None Level of assistance: Complete Independence Comments: antalgic/trendelenburg   TODAY'S TREATMENT:     DATE: 03/21/2023 Nustep level 5 x6 min with PT present to discuss status Patient was having significant vertigo: inquired if we could do the [place] with her or run her through it but we did not have orders.  She had seen neuro PT for this previously.  We were able to locate her previous exercises and printed those out for her.  Seated piriformis stretch to right side 2x20 sec.   Seated hip abduction with yellow loop 2x10 Supine bridge 2x10 with 2 sec hold Supine 90/90 heel tap 2x5 with ab soreness reported Supine 90/90 isometric contraction 2x20 sec Sidelying reverse clamshell x10 bilat (stopped at one set secondary to pain) Sidelying clamshell 2x10 bilat (with cuing for decreased pacing and proper hip stacking/positioning)  DATE: 03/05/2023 Nustep level 5 x6 min with PT present to discuss status Seated hamstring stretch 2x20 sec bilat Seated piriformis stretch to right side 2x20 sec.  Attempted left side, but pt had discomfort, so stopped. Seated hip abduction with yellow loop 2x10 Side stepping with yellow loop around ankles 4x15 ft bilat Supine 90/90 heel tap 2x5 with ab soreness reported Supine 90/90 isometric contraction 2x20 sec Supine bridge 2x10 with 2 sec hold Sidelying reverse clamshell x10 bilat (stopped at one set secondary to pain) Sidelying clamshell 2x10 bilat (with cuing for decreased pacing and proper hip stacking/positioning) Manual Therapy with patient in left sidelying to access right IT band, piriformis/glute with Addaday   DATE: 02/22/23 Recert and 10th  visit completed Side lying rolling of right IT band Side lying addaday to right IT band and glutes Seated clam x 20 with yellow loop Supine bridge with clam x 20 with yellow loop  Side lying clam right side with yellow loop Side lying hip IR with blue loop x 10 each side Ice to right hip post treatment x 10 min                                                                                                                     PATIENT EDUCATION:  Education details: Initiated HEP and educated on leg length discrepency vs muscle imbalance Person educated: Patient Education method: Programmer, multimedia, Facilities manager, Verbal cues, and Handouts Education comprehension: verbalized understanding, returned demonstration, and verbal cues required  HOME EXERCISE PROGRAM: Access Code: [email or web address] URL: [email or web address] Date: 02/13/2023 Prepared by: Mikey Kirschner  Exercises - Sidelying Hip Abduction  - 1 x daily - 7 x weekly - 2 sets - 10 reps - Clam with Resistance  - 1 x daily - 7 x weekly - 2 sets - 10 reps - Sidelying Reverse Clamshell with Resistance  - 1 x daily - 7 x weekly - 2 sets - 10 reps - Prone Hip Extension  - 1 x daily - 7 x weekly - 2 sets - 10 reps - Standing Hamstring Stretch on Chair  -  1 x daily - 7 x weekly - 1 sets - 3 reps - 30 sec hold - Standing Hip Extension with Counter Support  - 2 x daily - 7 x weekly - 2 sets - 10 reps - Standing Hip Abduction with Anterior Support  - 2 x daily - 7 x weekly - 2 sets - 10 reps - Sit to Stand Without Arm Support  - 2 x daily - 7 x weekly - 2 sets - 10 reps - Staggered Stance Step Throughs (Mirrored)  - 1 x daily - 7 x weekly - 2 sets - 5 reps - Squat with Chair Touch  - 1 x daily - 7 x weekly - 2 sets - 5 reps - Half Deadlift with Kettlebell  - 1 x daily - 7 x weekly - 2 sets - 5 reps - Single Leg Cone Touch  - 1 x daily - 7 x weekly - 1 sets - 10 reps - Forward Reach with Kettlebell  - 1 x daily - 7 x weekly - 1 sets - 10  reps - Supine Posterior Pelvic Tilt  - 1 x daily - 7 x weekly - 1 sets - 20 reps - Supine 90/90 Alternating Heel Touches with Posterior Pelvic Tilt  - 1 x daily - 7 x weekly - 1 sets - 20 reps - Supine Dead Bug with Leg Extension  - 1 x daily - 7 x weekly - 1 sets - 20 reps  ASSESSMENT:  CLINICAL IMPRESSION: We continue to focus on hamstring flexibility and hip extension/abduction neuromuscular rehabilitation exercises. Aveena was experiencing vertigo today, therefore some treatment was limited.  She had PT at neuro rehab for vertigo previously but could not recall exercises.  We located these exercises and printed those out for her.  Suggested she get order for vestibular assessment if sx's persist.  She would benefit from continuing skilled PT for monitoring these symptoms and progressing core strength.    OBJECTIVE IMPAIRMENTS: Abnormal gait, difficulty walking, decreased ROM, decreased strength, increased fascial restrictions, increased muscle spasms, impaired flexibility, and pain.   ACTIVITY LIMITATIONS: bending, sitting, squatting, stairs, transfers, bed mobility, and dressing  PARTICIPATION LIMITATIONS: meal prep, cleaning, laundry, driving, shopping, community activity, and yard work  PERSONAL FACTORS: Age, Fitness, and Past/current experiences are also affecting patient's functional outcome.   REHAB POTENTIAL: Good  CLINICAL DECISION MAKING: Stable/uncomplicated  EVALUATION COMPLEXITY: Low   GOALS: Goals reviewed with patient? Yes  SHORT TERM GOALS: Target date: 01/29/2023  Pain report to be no greater than 4/10  Baseline: Goal status: MET  2.  Patient will be independent with initial HEP  Baseline:  Goal status: MET   LONG TERM GOALS: Target date: 02/26/2023  Patient to report pain no greater than 2/10  Baseline:  Goal status: MET 02/15/23  2.  Patient to be independent with advanced HEP  Baseline:  Goal status: MET 02/15/23  3.  Patient to be able to sleep  through the night  Baseline:  Goal status: IN PROGRESS  4.  Patient to be able to stand or walk for at least 15 min without right hip pain Baseline:  Goal status: MET 02/15/23  5.  Functional scores to improve by 1-3 seconds Baseline:  Goal status: INITIAL  6.  Patient to report 85% improvement in overall symptoms  Baseline:  Goal status: INITIAL   PLAN:  PT FREQUENCY: 1x/week  PT DURATION: an additional  8 weeks  PLANNED INTERVENTIONS: Therapeutic exercises, Therapeutic activity, Neuromuscular re-education, Balance  training, Gait training, Patient/Family education, Self Care, Joint mobilization, Stair training, DME instructions, Aquatic Therapy, Dry Needling, Electrical stimulation, Spinal mobilization, Cryotherapy, Moist heat, Taping, Traction, Ultrasound, Ionotophoresis 4mg /ml Dexamethasone, Manual therapy, and Re-evaluation  PLAN FOR NEXT SESSION:  Nustep, review exercises added to HEP, focus on bilateral hip stability training and core strength.  DN/Manual therapy as indicated   Hasset Chaviano B. Saveon Plant, PT 03/21/23 11:09 PM  [facility] [address] [place], [place] [address] Phone # [phone] Fax [phone]

## 2023-03-27 ENCOUNTER — Telehealth: Payer: Self-pay

## 2023-03-27 ENCOUNTER — Ambulatory Visit: Payer: Medicare Other

## 2023-03-27 DIAGNOSIS — M62838 Other muscle spasm: Secondary | ICD-10-CM

## 2023-03-27 DIAGNOSIS — R262 Difficulty in walking, not elsewhere classified: Secondary | ICD-10-CM

## 2023-03-27 DIAGNOSIS — M25551 Pain in right hip: Secondary | ICD-10-CM

## 2023-03-27 DIAGNOSIS — M6281 Muscle weakness (generalized): Secondary | ICD-10-CM

## 2023-03-27 DIAGNOSIS — R252 Cramp and spasm: Secondary | ICD-10-CM

## 2023-03-27 DIAGNOSIS — M25651 Stiffness of right hip, not elsewhere classified: Secondary | ICD-10-CM

## 2023-03-27 NOTE — Telephone Encounter (Signed)
Received call from Boneta Lucks, PT at [facility]. She reports that they need physician signature for two plan of care orders.   They have faxed these documents previously, however, I was unable to find in provider's box.   She is going to re-fax these documents this morning.   Veronda Prude, RN

## 2023-03-27 NOTE — Therapy (Unsigned)
OUTPATIENT PHYSICAL THERAPY TREATMENT NOTE     Patient Name: Molly Giles MRN: [id number] DOB:February 25, 1956, 67 y.o., female Today's Date: 03/28/2023  END OF SESSION:  PT End of Session - 03/27/23 1536     Visit Number 13    Date for PT Re-Evaluation 04/19/23    Authorization Type Medicare A and B    PT Start Time 1536    PT Stop Time 1612    PT Time Calculation (min) 36 min    Activity Tolerance Patient tolerated treatment well    Behavior During Therapy [facility] for tasks assessed/performed              Past Medical History:  Diagnosis Date   Abdominal bloating 05/10/2021   Abdominal discomfort, generalized 05/07/2016   Acute gastritis 11/19/2020   Allergic rhinoconjunctivitis 07/02/2015   Anal fissure 11/19/2020   Anterior to posterior tear of superior glenoid labrum of left shoulder 02/22/2021   Arthritis    Benign positional vertigo 04/2015   Responded well to [facility]   Bruxism (teeth grinding)    Chronic contact dermatitis 08/06/2018   Per allergist Dr Edwyna Ready.    Chronic migraine 02/25/2016   Per Dr Lucia Gaskins review in notes from [facility] from September 2015. Showed total headache days last month 18. Severe headache days 7 days. Moderate headache days 5 days. Mild headache days last month sick days. Days without headache last month 10 days. Symptoms associated with photophobia, phonophobia, osmophobia, neck pain, dizziness, jaw pain, nasal congestion, vision disturbances, tingling and numbness, weakness and worsening with activity. Each headache attack last 3 hours depending on treatment in severity. Left side, the right side, easier side, the frontal area in the back of the head. Characterized as throbbing, pressure, tightness, squeezing, stabbing and burning    Chronic migraine without aura 05/15/2013    Dr Lucia Gaskins [facility]   Chronic tonsillitis 01/27/2019   DDD (degenerative disc disease), cervical 11/18/2018    DDD (degenerative disc disease), lumbar    Depression    Disc displacement, lumbar    Dysphagia 10/11/2021   Dysrhythmia    seen by dr Donnie Aho- not a problem since she has been on Bystolic   Episodic cluster headache, not intractable 03/06/2017   Essential hypertension 05/15/2013   Family history of adverse reaction to anesthesia    Brother- N/V   Family history of premature CAD 05/15/2013   Fibromyalgia syndrome 07/01/2015   Management by Dr Kathie Rhodes. Devashwar (Rheum)    GERD (gastroesophageal reflux disease) 12/16/2014   H/O seasonal allergies    Hallux rigidus of right foot 09/25/2022   Hammer toe    Left great toe   Hashimoto's thyroiditis    Per patient, diagnosed by Dr. Ferdinand Cava   Hearing loss of both ears 07/27/2015   mild to borderline moderate low frequency hearing loss improving to within normal limits bilaterally on audiology testing at [facility] in November 2016.     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   Hyperlipidemia 1998   Hypokalemia due to excessive gastrointestinal loss of potassium 07/28/2019   Hypothyroidism    Impairment of balance 02/2015   Consequent of postconcussive syndrome  Injury of triangular fibrocartilage complex of left wrist 02/25/2019   Dx 02/25/19 Bradly Bienenstock IV MD ([facility])   Insulin resistance 07/04/2017   Internal hemorrhoid 01/28/2021   Internal hemorrhoid seen on colonoscopy 10/2020 Roderic Scarce MD [facility])   Interstitial cystitis    Irritable bowel syndrome with diarrhea 04/03/2016   Left ventricular hypertrophy, mild 02/25/2016   ECHOcardiogram report 06/17/15 showing EF55-60%, mild LVH and G1DD    Loose total hip  arthroplasty (HCC) 03/10/2018   [facility]   Lumbar facet joint pain    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   Mood disorder (HCC)    Morbid obesity (HCC) 03/06/2017   Musculoskeletal neck pain 07/14/2015   Nocturnal hypoxemia 03/06/2017   Normal coronary arteries 05/14/2014   Obesity (BMI 30.0-34.9) 01/29/2017   Odynophagia 12/20/2021   Osteoarthritis of left hip 11/01/2015   MRI order by Dr Charlann Boxer (ortho) 10/2015 showed significant arthritis of left hip joint with cystic changes in femoral head c/w osteoarthritis   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, lumbar region 10/30/2011   Other insomnia 11/09/2016   Overweight    Pain in joint of left shoulder 11/09/2016   Pain in joint, multiple sites 11/18/2018   Palpitations 05/15/2013   [facility] manages   Perianal candidiasis 12/20/2021   Periodic limb movement sleep disorder 03/28/2017   Perirectal cyst 05/07/2016   Poison ivy dermatitis 03/24/2021   PONV (postoperative nausea and vomiting)    Positive ANA (antinuclear antibody) 11/18/2018   Post concussion syndrome 06/06/2015   Post concussive syndrome 07/14/2015   Ms Pigeon's post-concussive syndrome manifesting in vertigo and headache, mood changes, poor balance, dizziness, and decreased concentration per Dr Lucia Gaskins at [facility].    Posterior vitreous detachment of right eye 2014   Premature menopause 12/20/2021   Rectal abscess 05/10/2021   S/P left THA, AA 07/25/2016   Sacroiliac inflammation (HCC) 08/18/2021   Shingles    Shortness of breath dyspnea    with exertion   Sicca syndrome (HCC) 11/18/2018   (+) ANA   Sleep walking and eating 03/06/2017   Snoring 03/06/2017   Spondylosis of lumbar region without myelopathy or radiculopathy 10/30/2011   TFC (triangular fibrocartilage complex) injury 02/25/2019   Thyroid nodule 08/11/2009   Findings: The thyroid gland is within normal limits in size.  The gland is diffusely  inhomogeneous. A small solid nodule is noted in the lower pole  medially on the right of 7 x 6 x 8 mm. A small solid nodule is noted inferiorly on the left of 3 x 3 x 4 mm.  IMPRESSION:  The thyroid gland is within normal limits in size with only small solid nodules present, the largest of only 8 mm in diameter on the right.     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    Trochanteric bursitis, right hip 04/26/2020   Tubular adenoma of colon 01/28/2021   Colonoscopy screening 4 mm tubular Adenoma polyp Charlott Rakes, MD [facility])   Tubular adenoma of colon 01/28/2021   Colonoscopy screening 4 mm tubular Adenoma polyp Charlott Rakes, MD [facility])   Vasomotor symptoms due to menopause 04/19/2017   Vitamin D deficiency 05/08/2017   Vulvitis 07/22/2020   Yeast vaginitis 07/11/2019   Past Surgical History:  Procedure Laterality Date   arthroscopy Left 01/2022   with SAD and DCR, K. Supple MD   Bladder dilitation     x 3  BREAST BIOPSY Right 2011   Benign histology   CARDIOVASCULAR STRESS TEST  2000   Unremarkable per pt report   CARPOMETACARPAL JOINT ARTHROTOMY Right 2011   COLONOSCOPY     COLONOSCOPY WITH PROPOFOL N/A 04/21/2015   Procedure: COLONOSCOPY WITH PROPOFOL;  Surgeon: Jeani Hawking, MD;  Location[facility] ENDOSCOPY;  Service: Endoscopy;  Laterality: N/A;   CYSTOSCOPY W/ DILATION OF BLADDER N/A    EPIDURAL BLOCK INJECTION Left 04/12/2016   Left Medial Nerve Block and Left L5 ramus block, Dr Sheran Luz    EPIDURAL BLOCK INJECTION  03/21/2016   Left L3-4 medial branch block and Left L5 & dorsal ramus block    EPIDURAL BLOCK INJECTION N/A 10/25/2016   Sheran Luz, MD. Lumbar medial branch block   EPIDURAL BLOCK INJECTION N/A 02/09/2017   Sheran Luz, MD.  Bilateral L3/4 medial branch block, bilateral L5 dorsal ramus block   EPIDURAL BLOCK INJECTION N/A 07/04/2017   Sheran Luz, MD   FECAL TRANSPLANT   04/21/2015   Procedure: FECAL TRANSPLANT;  Surgeon: Jeani Hawking, MD;  Location: [facility] ENDOSCOPY;  Service: Endoscopy;;   HIP ARTHROPLASTY Left    HIP ARTHROSCOPY Left 03/06/2018   Left hip arthroplasty, redo for loose hip arthroplasty. Procedure at [facility] hospital   INJECTION HIP INTRA ARTICULAR Left 11/2015   for OA by Dr Vilma Prader IMPLANT PLACEMENT  04/2021   Floyde Parkins MD ([facility])   INTERSTIM IMPLANT REVISION N/A 09/2021   Floyde Parkins MD ([facility])   OTHER SURGICAL HISTORY Left 2016   Left L3/L4 medial nerve block and Left L5 Dorsal Ramus block Dr Rosine Abe   TOTAL HIP ARTHROPLASTY Left 07/25/2016   Procedure: LEFT TOTAL HIP ARTHROPLASTY ANTERIOR APPROACH;  Surgeon: Durene Romans, MD;  Location: [facility];  Service: Orthopedics;  Laterality: Left;   Patient Active Problem List   Diagnosis Date Noted   Vertigo 03/19/2023   Shingles 03/19/2023   Polyphagia 03/07/2023   Rash 02/07/2023   BMI 29.0-29.9,adult Current BMI 29.9 02/07/2023   Low serum vitamin B12 01/10/2023   OSA (obstructive sleep apnea) 12/27/2022   BMI 30.0-30.9,adult 12/27/2022   Severe obstructive sleep apnea-hypopnea syndrome 12/22/2022   Hyperglycemia 12/11/2022   Depression screening 12/11/2022   Generalized obesity 12/11/2022   SOBOE (shortness of breath on exertion) 12/11/2022   Other fatigue 12/11/2022   Sleep related headaches 11/07/2022   Intractable episodic cluster headache 11/07/2022   Sleep walking and eating 11/07/2022   Ambien use disorder, 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   Psychophysiological insomnia 11/07/2022   Rhinitis 11/03/2022   Hallux rigidus of right foot 09/25/2022   Morton neuroma, right 09/25/2022   Iliopsoas bursitis of left hip 05/08/2022   Chronic pain of right knee 05/08/2022   Arthrosis of hand 12/20/2021   History of Clostridioides difficile colitis, required fecal transplantation 12/20/2021   Mixed hyperlipidemia 12/20/2021   Meibomian gland  dysfunction (MGD) of both eyes 10/17/2021   Nuclear sclerosis of both eyes 10/17/2021   Epiretinal membrane (ERM) of left eye 10/17/2021   OAB (overactive bladder) 03/02/2021   Osteoarthritis of acromioclavicular joint 02/22/2021   Hyperhidrosis, scalp, 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   Chronic low back pain 05/14/2019   Cervical spondylosis 11/18/2018   DDD (degenerative disc disease), lumbar 11/18/2018   Keratoconjunctivitis sicca (HCC) 11/18/2018   Chronic contact dermatitis 08/06/2018   Primary osteoarthritis of left knee 06/20/2018   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HCC) 11/29/2017   Insulin resistance 07/04/2017   Vitamin D  deficiency 05/08/2017   Periodic limb movement sleep disorder 03/28/2017   Obesity (BMI 30.0-34.9) 01/29/2017   Other insomnia 11/09/2016   Irritable bowel syndrome with diarrhea 04/03/2016   Left ventricular hypertrophy, mild 02/25/2016   Mood disorder (HCC) 07/02/2015   Allergic rhinoconjunctivitis 07/02/2015   Fibromyalgia syndrome 07/01/2015   Personal history of colonic polyps 07/01/2015   GERD (gastroesophageal reflux disease) 12/16/2014   Hypothyroidism 05/15/2013   Pure hypercholesterolemia 05/15/2013   Chronic interstitial cystitis 05/15/2013   Chronic migraine without aura 05/15/2013    PCP: McDiarmid, Leighton Roach, MD   REFERRING PROVIDER: Molly Maduro, MD   REFERRING DIAG: M25.551 Pain in right hip  THERAPY DIAG:  Pain in right hip  Stiffness of right hip, not elsewhere classified  Muscle weakness (generalized)  Cramp and spasm  Difficulty in walking, not elsewhere classified  Other muscle spasm  Rationale for Evaluation and Treatment: Rehabilitation  ONSET DATE: 12/11/22  SUBJECTIVE:   SUBJECTIVE STATEMENT: Pt reports that the vertigo is better since she re-visited her home program from neuro.  She reports her back and hip pain at 4/10.     PERTINENT HISTORY: Left hip THA PAIN:  02/22/23: Are you having pain?  4-5/10 Pain description:   sharp with walking Rt posterior- lateral hip, aggravated when hip is tired at the end of the day Pain is better at the start of the day.    PRECAUTIONS: None  WEIGHT BEARING RESTRICTIONS: No  FALLS:  Has patient fallen in last 6 months? No  LIVING ENVIRONMENT: Lives with: lives with their spouse Lives in: House/apartment   OCCUPATION: Retired, she has sleep apnea and tends to sleep in, gets up to do housework, goes out with friends.  She was walking her dog but unable currently due to the pain.   PLOF: Independent, Independent with basic ADLs, Independent with household mobility without device, Independent with community mobility without device, Independent with homemaking with ambulation, Independent with gait, and Independent with transfers  PATIENT GOALS: To get out of pain and be able to walk and be more active  NEXT MD VISIT: prn  OBJECTIVE:   DIAGNOSTIC FINDINGS:  Impression: Ultrasound Undersurface fraying and probable low-grade partial tearing of the  distal gluteus minimus tendon at the greater trochanter with  moderate subgluteus bursitis.  PATIENT SURVEYS:  Eval:  LEFS: 33/80  COGNITION: Overall cognitive status: Within functional limits for tasks assessed     SENSATION: WFL   MUSCLE LENGTH: Hamstrings: Right 50 deg; Left 60 deg Thomas test: Right Positive; Left Positive  POSTURE: No Significant postural limitations  PALPATION: Tender over lateral right hip at greater trochanter  LOWER EXTREMITY ROM:  [facility]  LOWER EXTREMITY MMT:  MMT Right eval Right 02/22/23 Left eval Left 02/22/23  Hip flexion 4+ 4+ 4+ 4+  Hip extension 3+ 4- 4 4  Hip abduction 3+ 4- 3+ 4-  Hip adduction 5 5 5 5   Hip internal rotation 4- 4 4- 4-  Hip external rotation 4- 4 4- 4   LOWER EXTREMITY SPECIAL TESTS:  Hip special tests: Luisa Hart (FABER) test: positive , Trendelenburg test: positive , Thomas test: positive , and Ober's test: positive   FUNCTIONAL TESTS:   Eval: 5 times sit to stand: 11.67 sec Timed up and go (TUG): 8.57 sec  02/22/23: 5 times sit to stand: 10.10 sec Timed up and go (TUG): 6.58 sec  GAIT: Distance walked: had worked up to approx 4 blocks Assistive device utilized:  oc and None Level of assistance:  Complete Independence Comments: antalgic/trendelenburg   TODAY'S TREATMENT:     DATE: 03/27/2023 Nustep level 5 x 5 min with PT present to discuss status Supine lower trunk rotation x 20 Supine piriformis stretch x 2 each LE holding 20 sec each.   Trigger Point Dry-Needling  Treatment instructions: Expect mild to moderate muscle soreness. S/S of pneumothorax if dry needled over a lung field, and to seek immediate medical attention should they occur. Patient verbalized understanding of these instructions and education. Patient Consent Given: Yes Education handout provided: Yes Muscles treated: Glut max, medius and minimus Electrical stimulation performed: No Parameters: N/A Treatment response/outcome: Skilled palpation used to identify taut bands and trigger points.  Once identified, dry needling techniques used to treat these areas.  Deep ache response ellicited along with verbal relief of symptoms.  "Wow, I can walk without pain? That feels great"  DATE: 03/21/2023 Nustep level 5 x6 min with PT present to discuss status Patient was having significant vertigo: inquired if we could do the [place] with her or run her through it but we did not have orders.  She had seen neuro PT for this previously.  We were able to locate her previous exercises and printed those out for her.  Seated piriformis stretch to right side 2x20 sec.   Seated hip abduction with yellow loop 2x10 Supine bridge 2x10 with 2 sec hold Supine 90/90 heel tap 2x5 with ab soreness reported Supine 90/90 isometric contraction 2x20 sec Sidelying reverse clamshell x10 bilat (stopped at one set secondary to pain) Sidelying clamshell 2x10 bilat (with cuing for  decreased pacing and proper hip stacking/positioning)  DATE: 03/05/2023 Nustep level 5 x6 min with PT present to discuss status Seated hamstring stretch 2x20 sec bilat Seated piriformis stretch to right side 2x20 sec.  Attempted left side, but pt had discomfort, so stopped. Seated hip abduction with yellow loop 2x10 Side stepping with yellow loop around ankles 4x15 ft bilat Supine 90/90 heel tap 2x5 with ab soreness reported Supine 90/90 isometric contraction 2x20 sec Supine bridge 2x10 with 2 sec hold Sidelying reverse clamshell x10 bilat (stopped at one set secondary to pain) Sidelying clamshell 2x10 bilat (with cuing for decreased pacing and proper hip stacking/positioning) Manual Therapy with patient in left sidelying to access right IT band, piriformis/glute with Addaday                                                                                                                     PATIENT EDUCATION:  Education details: Initiated HEP and educated on leg length discrepency vs muscle imbalance Person educated: Patient Education method: Programmer, multimedia, Facilities manager, Verbal cues, and Handouts Education comprehension: verbalized understanding, returned demonstration, and verbal cues required  HOME EXERCISE PROGRAM: Access Code: [email or web address] URL: [email or web address] Date: 02/13/2023 Prepared by: Mikey Kirschner  Exercises - Sidelying Hip Abduction  - 1 x daily - 7 x weekly - 2 sets - 10 reps - Clam with Resistance  - 1 x daily - 7 x weekly - 2 sets -  10 reps - Sidelying Reverse Clamshell with Resistance  - 1 x daily - 7 x weekly - 2 sets - 10 reps - Prone Hip Extension  - 1 x daily - 7 x weekly - 2 sets - 10 reps - Standing Hamstring Stretch on Chair  - 1 x daily - 7 x weekly - 1 sets - 3 reps - 30 sec hold - Standing Hip Extension with Counter Support  - 2 x daily - 7 x weekly - 2 sets - 10 reps - Standing Hip Abduction with Anterior Support  - 2 x daily - 7 x weekly -  2 sets - 10 reps - Sit to Stand Without Arm Support  - 2 x daily - 7 x weekly - 2 sets - 10 reps - Staggered Stance Step Throughs (Mirrored)  - 1 x daily - 7 x weekly - 2 sets - 5 reps - Squat with Chair Touch  - 1 x daily - 7 x weekly - 2 sets - 5 reps - Half Deadlift with Kettlebell  - 1 x daily - 7 x weekly - 2 sets - 5 reps - Single Leg Cone Touch  - 1 x daily - 7 x weekly - 1 sets - 10 reps - Forward Reach with Kettlebell  - 1 x daily - 7 x weekly - 1 sets - 10 reps - Supine Posterior Pelvic Tilt  - 1 x daily - 7 x weekly - 1 sets - 20 reps - Supine 90/90 Alternating Heel Touches with Posterior Pelvic Tilt  - 1 x daily - 7 x weekly - 1 sets - 20 reps - Supine Dead Bug with Leg Extension  - 1 x daily - 7 x weekly - 1 sets - 20 reps  ASSESSMENT:  CLINICAL IMPRESSION: Kissie's vertigo seems to be resolving slowly.  She continues to have intermittent back and hip pain but does not seem to be significantly limited by this functionally.  She seems compliant with her HEP.   She would benefit from continuing skilled PT for monitoring these symptoms and progressing core strength.    OBJECTIVE IMPAIRMENTS: Abnormal gait, difficulty walking, decreased ROM, decreased strength, increased fascial restrictions, increased muscle spasms, impaired flexibility, and pain.   ACTIVITY LIMITATIONS: bending, sitting, squatting, stairs, transfers, bed mobility, and dressing  PARTICIPATION LIMITATIONS: meal prep, cleaning, laundry, driving, shopping, community activity, and yard work  PERSONAL FACTORS: Age, Fitness, and Past/current experiences are also affecting patient's functional outcome.   REHAB POTENTIAL: Good  CLINICAL DECISION MAKING: Stable/uncomplicated  EVALUATION COMPLEXITY: Low   GOALS: Goals reviewed with patient? Yes  SHORT TERM GOALS: Target date: 01/29/2023  Pain report to be no greater than 4/10  Baseline: Goal status: MET  2.  Patient will be independent with initial HEP   Baseline:  Goal status: MET   LONG TERM GOALS: Target date: 02/26/2023  Patient to report pain no greater than 2/10  Baseline:  Goal status: MET 02/15/23  2.  Patient to be independent with advanced HEP  Baseline:  Goal status: MET 02/15/23  3.  Patient to be able to sleep through the night  Baseline:  Goal status: IN PROGRESS  4.  Patient to be able to stand or walk for at least 15 min without right hip pain Baseline:  Goal status: MET 02/15/23  5.  Functional scores to improve by 1-3 seconds Baseline:  Goal status: INITIAL  6.  Patient to report 85% improvement in overall symptoms  Baseline:  Goal status:  INITIAL   PLAN:  PT FREQUENCY: 1x/week  PT DURATION: an additional  8 weeks  PLANNED INTERVENTIONS: Therapeutic exercises, Therapeutic activity, Neuromuscular re-education, Balance training, Gait training, Patient/Family education, Self Care, Joint mobilization, Stair training, DME instructions, Aquatic Therapy, Dry Needling, Electrical stimulation, Spinal mobilization, Cryotherapy, Moist heat, Taping, Traction, Ultrasound, Ionotophoresis 4mg /ml Dexamethasone, Manual therapy, and Re-evaluation  PLAN FOR NEXT SESSION:  Assess response to DN #1, Nustep, review exercises added to HEP, focus on bilateral hip stability training and core strength.  DN/Manual therapy as indicated   Geraldina Parrott B. Adamaris King, PT 03/28/23 7:41 AM  [facility] [address], [address] [place], [place] [address] Phone # [phone] Fax [phone]

## 2023-03-27 NOTE — Telephone Encounter (Signed)
Call placed to inquire re: POC's not yet signed.  Spoke with nurse.  She states she does not see anything in his inbox and can we re-fax to [phone] and she will make a note about this to follow up for signatures.

## 2023-03-28 ENCOUNTER — Encounter: Payer: Self-pay | Admitting: Neurology

## 2023-03-28 ENCOUNTER — Telehealth: Payer: Self-pay

## 2023-03-28 ENCOUNTER — Ambulatory Visit: Payer: Medicare Other | Admitting: Neurology

## 2023-03-28 VITALS — BP 117/79 | HR 102 | Ht 66.0 in | Wt 182.4 lb

## 2023-03-28 DIAGNOSIS — G4733 Obstructive sleep apnea (adult) (pediatric): Secondary | ICD-10-CM | POA: Diagnosis not present

## 2023-03-28 DIAGNOSIS — G43709 Chronic migraine without aura, not intractable, without status migrainosus: Secondary | ICD-10-CM | POA: Diagnosis not present
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
## 2023-03-28 NOTE — Patient Instructions (Addendum)
Order mask refit from DME Increase the humidity, I will reduce the pressure Can try barrier cream (Aquaphor)  inside your nose if needed Try nightly use for minimum for 4 hours Continue to work on weight loss Follow up in 4-6 months  I will pull a download in 1 month

## 2023-03-28 NOTE — Telephone Encounter (Signed)
CPAP orders placed, message sent through community message via epi.

## 2023-03-29 ENCOUNTER — Ambulatory Visit (INDEPENDENT_AMBULATORY_CARE_PROVIDER_SITE_OTHER): Payer: Medicare Other | Admitting: Family Medicine

## 2023-03-29 ENCOUNTER — Encounter (INDEPENDENT_AMBULATORY_CARE_PROVIDER_SITE_OTHER): Payer: Self-pay | Admitting: Family Medicine

## 2023-03-29 VITALS — BP 94/66 | HR 109 | Temp 97.6°F | Ht 66.0 in | Wt 178.0 lb

## 2023-03-29 DIAGNOSIS — E669 Obesity, unspecified: Secondary | ICD-10-CM

## 2023-03-29 DIAGNOSIS — Z6828 Body mass index (BMI) 28.0-28.9, adult: Secondary | ICD-10-CM

## 2023-03-29 DIAGNOSIS — E785 Hyperlipidemia, unspecified: Secondary | ICD-10-CM

## 2023-03-29 DIAGNOSIS — E7800 Pure hypercholesterolemia, unspecified: Secondary | ICD-10-CM

## 2023-03-29 DIAGNOSIS — E559 Vitamin D deficiency, unspecified: Secondary | ICD-10-CM | POA: Diagnosis not present

## 2023-03-29 NOTE — Progress Notes (Signed)
.smr  Office: [phone]  /  Fax: [phone]  WEIGHT SUMMARY AND BIOMETRICS  Anthropometric Measurements Height: 5\' 6"  (1.676 m) Weight: 178 lb (80.7 kg) BMI (Calculated): 28.74 Weight at Last Visit: 182 lb Weight Lost Since Last Visit: 4 lb Weight Gained Since Last Visit: 0 Starting Weight: 199 lb   Body Composition  Body Fat %: 42.8 % Fat Mass (lbs): 76.4 lbs Muscle Mass (lbs): 96.8 lbs Total Body Water (lbs): 68.8 lbs Visceral Fat Rating : 11   Other Clinical Data Fasting: No Labs: No Today's Visit #: 8 Starting Date: 12/11/22    Chief Complaint: OBESITY   Discussed the use of AI scribe software for clinical note transcription with the patient, who gave verbal consent to proceed.  History of Present Illness   The patient is a 67 year old individual who presented for a follow-up consultation regarding her ongoing weight loss efforts and management of hyperlipidemia. She reported a successful weight loss of four pounds over the past three weeks, adhering to a category one eating plan 98% of the time. However, she noted that she has not been exercising regularly.  The patient reported an incident of severe dizziness and nausea that occurred while shopping for a dress. She described a sudden onset of vertigo, which led to a fall in the dressing room, resulting in neck and shoulder pain. This episode of vertigo was accompanied by severe nausea that lasted for a week, which deterred her from starting a new medication, Zepbound. She has a history of vertigo and managed the recent episode with exercises she had learned previously.  The patient expressed a desire to diversify her lunch options, expressing fatigue with her current [employer] meals. She inquired about incorporating foods such as hummus, cottage cheese, and sushi into her diet. She was provided with guidance on how to balance her protein and calorie intake while incorporating these foods.          PHYSICAL  EXAM:  Blood pressure 94/66, pulse (!) 109, temperature 97.6 F (36.4 C), height 5\' 6"  (1.676 m), weight 178 lb (80.7 kg), SpO2 96%. Body mass index is 28.73 kg/m.  DIAGNOSTIC DATA REVIEWED:  BMET    Component Value Date/Time   NA 138 12/11/2022 1105   K 4.0 12/11/2022 1105   CL 103 12/11/2022 1105   CO2 19 (L) 12/11/2022 1105   GLUCOSE 84 12/11/2022 1105   GLUCOSE 93 07/05/2019 1629   BUN 16 12/11/2022 1105   CREATININE 0.88 12/11/2022 1105   CREATININE 0.74 02/24/2016 1158   CALCIUM 9.4 12/11/2022 1105   GFRNONAA 65 07/24/2019 1116   GFRNONAA 88 02/24/2016 1158   GFRAA 75 07/24/2019 1116   GFRAA >89 02/24/2016 1158   Lab Results  Component Value Date   HGBA1C 5.3 12/11/2022   HGBA1C 5.3 03/22/2017   Lab Results  Component Value Date   INSULIN 7.8 12/11/2022   INSULIN 10.5 03/22/2017   Lab Results  Component Value Date   TSH 4.960 (H) 12/11/2022   CBC    Component Value Date/Time   WBC 6.4 08/04/2020 0000   WBC 6.6 07/05/2019 1629   RBC 4.26 08/04/2020 0000   HGB 13.5 08/04/2020 0000   HGB 12.6 05/07/2018 1057   HCT 41 08/04/2020 0000   HCT 38.8 05/07/2018 1057   PLT 262 08/04/2020 0000   MCV 95.0 07/05/2019 1629   MCV 90 05/07/2018 1057   MCH 30.0 07/05/2019 1629   MCHC 31.6 07/05/2019 1629   RDW 13.8 07/05/2019 1629  RDW 14.0 05/07/2018 1057   Iron Studies No results found for: "IRON", "TIBC", "FERRITIN", "IRONPCTSAT" Lipid Panel     Component Value Date/Time   CHOL 176 12/11/2022 1105   TRIG 117 12/11/2022 1105   HDL 54 12/11/2022 1105   CHOLHDL 3.3 10/11/2021 1142   CHOLHDL 3.9 12/14/2014 1036   VLDL 26 12/14/2014 1036   LDLCALC 101 (H) 12/11/2022 1105   Hepatic Function Panel     Component Value Date/Time   PROT 6.5 12/11/2022 1105   ALBUMIN 4.5 12/11/2022 1105   AST 19 12/11/2022 1105   ALT 20 12/11/2022 1105   ALKPHOS 81 12/11/2022 1105   BILITOT 0.3 12/11/2022 1105   BILIDIR 0.14 02/12/2020 1056      Component Value  Date/Time   TSH 4.960 (H) 12/11/2022 1105   Nutritional Lab Results  Component Value Date   VD25OH 41.2 12/11/2022   VD25OH 43.3 05/07/2018   VD25OH 48.5 07/04/2017     Assessment and Plan    Obesity: Continued weight loss with a loss of 4 pounds in the last 3 weeks. Adherence to category one eating plan 98% of the time. No current exercise regimen. -Continue current diet plan. -Consider adding exercise to routine. -Modify lunch meal plan to include 300-450 calories with at least 25 grams of protein.  Hyperlipidemia: Managed with diet, exercise, and Crestor 10mg  nightly. -Continue current management plan and continue with diet and exercise as discussed to improve HLD  Vitamin D deficiency: -Continue Vitamin D supplementation.         I have personally spent 44 minutes total time today in preparation, patient care, and documentation for this visit, including the following: review of clinical lab tests; review of medical tests/procedures/services.    She was informed of the importance of frequent follow up visits to maximize her success with intensive lifestyle modifications for her multiple health conditions.    Quillian Quince, MD

## 2023-03-29 NOTE — Progress Notes (Deleted)
.smr  Office: [phone]  /  Fax: [phone]  WEIGHT SUMMARY AND BIOMETRICS  Anthropometric Measurements Height: 5\' 6"  (1.676 m) Weight: 178 lb (80.7 kg) BMI (Calculated): 28.74 Weight at Last Visit: 182 lb Weight Lost Since Last Visit: 4 lb Weight Gained Since Last Visit: 0 Starting Weight: 199 lb   Body Composition  Body Fat %: 42.8 % Fat Mass (lbs): 76.4 lbs Muscle Mass (lbs): 96.8 lbs Total Body Water (lbs): 68.8 lbs Visceral Fat Rating : 11   Other Clinical Data Fasting: No Labs: No Today's Visit #: 8 Starting Date: 12/11/22    Chief Complaint: OBESITY   Discussed the use of AI scribe software for clinical note transcription with the patient, who gave verbal consent to proceed.  History of Present Illness              PHYSICAL EXAM:  Blood pressure 94/66, pulse (!) 109, temperature 97.6 F (36.4 C), height 5\' 6"  (1.676 m), weight 178 lb (80.7 kg), SpO2 96%. Body mass index is 28.73 kg/m.  DIAGNOSTIC DATA REVIEWED:  BMET    Component Value Date/Time   NA 138 12/11/2022 1105   K 4.0 12/11/2022 1105   CL 103 12/11/2022 1105   CO2 19 (L) 12/11/2022 1105   GLUCOSE 84 12/11/2022 1105   GLUCOSE 93 07/05/2019 1629   BUN 16 12/11/2022 1105   CREATININE 0.88 12/11/2022 1105   CREATININE 0.74 02/24/2016 1158   CALCIUM 9.4 12/11/2022 1105   GFRNONAA 65 07/24/2019 1116   GFRNONAA 88 02/24/2016 1158   GFRAA 75 07/24/2019 1116   GFRAA >89 02/24/2016 1158   Lab Results  Component Value Date   HGBA1C 5.3 12/11/2022   HGBA1C 5.3 03/22/2017   Lab Results  Component Value Date   INSULIN 7.8 12/11/2022   INSULIN 10.5 03/22/2017   Lab Results  Component Value Date   TSH 4.960 (H) 12/11/2022   CBC    Component Value Date/Time   WBC 6.4 08/04/2020 0000   WBC 6.6 07/05/2019 1629   RBC 4.26 08/04/2020 0000   HGB 13.5 08/04/2020 0000   HGB 12.6 05/07/2018 1057   HCT 41 08/04/2020 0000   HCT 38.8 05/07/2018 1057   PLT 262 08/04/2020 0000    MCV 95.0 07/05/2019 1629   MCV 90 05/07/2018 1057   MCH 30.0 07/05/2019 1629   MCHC 31.6 07/05/2019 1629   RDW 13.8 07/05/2019 1629   RDW 14.0 05/07/2018 1057   Iron Studies No results found for: "IRON", "TIBC", "FERRITIN", "IRONPCTSAT" Lipid Panel     Component Value Date/Time   CHOL 176 12/11/2022 1105   TRIG 117 12/11/2022 1105   HDL 54 12/11/2022 1105   CHOLHDL 3.3 10/11/2021 1142   CHOLHDL 3.9 12/14/2014 1036   VLDL 26 12/14/2014 1036   LDLCALC 101 (H) 12/11/2022 1105   Hepatic Function Panel     Component Value Date/Time   PROT 6.5 12/11/2022 1105   ALBUMIN 4.5 12/11/2022 1105   AST 19 12/11/2022 1105   ALT 20 12/11/2022 1105   ALKPHOS 81 12/11/2022 1105   BILITOT 0.3 12/11/2022 1105   BILIDIR 0.14 02/12/2020 1056      Component Value Date/Time   TSH 4.960 (H) 12/11/2022 1105   Nutritional Lab Results  Component Value Date   VD25OH 41.2 12/11/2022   VD25OH 43.3 05/07/2018   VD25OH 48.5 07/04/2017     Assessment and Plan              I have personally spent ***  minutes total time today in preparation, patient care, and documentation for this visit, including the following: review of clinical lab tests; review of medical tests/procedures/services.    She was informed of the importance of frequent follow up visits to maximize her success with intensive lifestyle modifications for her multiple health conditions.    Quillian Quince, MD

## 2023-03-30 NOTE — Telephone Encounter (Signed)
I signed Physical Therapy orders yesterday and placed in fax pile

## 2023-04-03 ENCOUNTER — Ambulatory Visit: Payer: Medicare Other

## 2023-04-03 DIAGNOSIS — M6281 Muscle weakness (generalized): Secondary | ICD-10-CM

## 2023-04-03 DIAGNOSIS — M25551 Pain in right hip: Secondary | ICD-10-CM | POA: Diagnosis not present

## 2023-04-03 DIAGNOSIS — R262 Difficulty in walking, not elsewhere classified: Secondary | ICD-10-CM

## 2023-04-03 DIAGNOSIS — R252 Cramp and spasm: Secondary | ICD-10-CM

## 2023-04-03 DIAGNOSIS — M25651 Stiffness of right hip, not elsewhere classified: Secondary | ICD-10-CM

## 2023-04-03 NOTE — Therapy (Signed)
OUTPATIENT PHYSICAL THERAPY TREATMENT NOTE     Patient Name: Molly Giles MRN: [id number] DOB:1956/08/26, 67 y.o., female Today's Date: 04/03/2023  END OF SESSION:  PT End of Session - 04/03/23 1534     Visit Number 14    Date for PT Re-Evaluation 04/19/23    Authorization Type Medicare A and B    Progress Note Due on Visit 20    PT Start Time 1530    PT Stop Time 1600    PT Time Calculation (min) 30 min    Activity Tolerance Patient tolerated treatment well    Behavior During Therapy [facility] for tasks assessed/performed              Past Medical History:  Diagnosis Date   Abdominal bloating 05/10/2021   Abdominal discomfort, generalized 05/07/2016   Acute gastritis 11/19/2020   Allergic rhinoconjunctivitis 07/02/2015   Anal fissure 11/19/2020   Anterior to posterior tear of superior glenoid labrum of left shoulder 02/22/2021   Arthritis    Benign positional vertigo 04/2015   Responded well to [facility]   Bruxism (teeth grinding)    Chronic contact dermatitis 08/06/2018   Per allergist Dr Edwyna Ready.    Chronic migraine 02/25/2016   Per Dr Lucia Gaskins review in notes from [facility] from September 2015. Showed total headache days last month 18. Severe headache days 7 days. Moderate headache days 5 days. Mild headache days last month sick days. Days without headache last month 10 days. Symptoms associated with photophobia, phonophobia, osmophobia, neck pain, dizziness, jaw pain, nasal congestion, vision disturbances, tingling and numbness, weakness and worsening with activity. Each headache attack last 3 hours depending on treatment in severity. Left side, the right side, easier side, the frontal area in the back of the head. Characterized as throbbing, pressure, tightness, squeezing, stabbing and burning    Chronic migraine without aura 05/15/2013    Dr Lucia Gaskins [facility]   Chronic tonsillitis 01/27/2019   DDD (degenerative disc  disease), cervical 11/18/2018   DDD (degenerative disc disease), lumbar    Depression    Disc displacement, lumbar    Dysphagia 10/11/2021   Dysrhythmia    seen by dr Donnie Aho- not a problem since she has been on Bystolic   Episodic cluster headache, not intractable 03/06/2017   Essential hypertension 05/15/2013   Family history of adverse reaction to anesthesia    Brother- N/V   Family history of premature CAD 05/15/2013   Fibromyalgia syndrome 07/01/2015   Management by Dr Kathie Rhodes. Devashwar (Rheum)    GERD (gastroesophageal reflux disease) 12/16/2014   H/O seasonal allergies    Hallux rigidus of right foot 09/25/2022   Hammer toe    Left great toe   Hashimoto's thyroiditis    Per patient, diagnosed by Dr. Ferdinand Cava   Hearing loss of both ears 07/27/2015   mild to borderline moderate low frequency hearing loss improving to within normal limits bilaterally on audiology testing at [facility] in November 2016.     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   Hyperlipidemia 1998   Hypokalemia due to excessive gastrointestinal loss of potassium 07/28/2019   Hypothyroidism    Impairment of balance  02/2015   Consequent of postconcussive syndrome   Injury of triangular fibrocartilage complex of left wrist 02/25/2019   Dx 02/25/19 Bradly Bienenstock IV MD ([facility])   Insulin resistance 07/04/2017   Internal hemorrhoid 01/28/2021   Internal hemorrhoid seen on colonoscopy 10/2020 Roderic Scarce MD [facility])   Interstitial cystitis    Irritable bowel syndrome with diarrhea 04/03/2016   Left ventricular hypertrophy, mild 02/25/2016   ECHOcardiogram report 06/17/15 showing EF55-60%, mild LVH and  G1DD    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HCC) 03/10/2018   [facility]   Lumbar facet joint pain    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   Mood disorder (HCC)    Morbid obesity (HCC) 03/06/2017   Musculoskeletal neck pain 07/14/2015   Nocturnal hypoxemia 03/06/2017   Normal coronary arteries 05/14/2014   Obesity (BMI 30.0-34.9) 01/29/2017   Odynophagia 12/20/2021   Osteoarthritis of left hip 11/01/2015   MRI order by Dr Charlann Boxer (ortho) 10/2015 showed significant arthritis of left hip joint with cystic changes in femoral head c/w osteoarthritis   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, lumbar region 10/30/2011   Other insomnia 11/09/2016   Overweight    Pain in joint of left shoulder 11/09/2016   Pain in joint, multiple sites 11/18/2018   Palpitations 05/15/2013   [facility] manages   Perianal candidiasis 12/20/2021   Periodic limb movement sleep disorder 03/28/2017   Perirectal cyst 05/07/2016   Poison ivy dermatitis 03/24/2021   PONV (postoperative nausea and vomiting)    Positive ANA (antinuclear antibody) 11/18/2018   Post concussion syndrome 06/06/2015   Post concussive syndrome 07/14/2015   Ms Posch's post-concussive syndrome manifesting in vertigo and headache, mood changes, poor balance, dizziness, and decreased concentration per Dr Lucia Gaskins at [facility].    Posterior vitreous detachment of right eye 2014   Premature menopause 12/20/2021   Rectal abscess 05/10/2021   S/P left THA, AA 07/25/2016   Sacroiliac inflammation (HCC) 08/18/2021   Shingles    Shortness of breath dyspnea    with exertion   Sicca syndrome (HCC) 11/18/2018   (+) ANA   Sleep walking and eating 03/06/2017   Snoring 03/06/2017   Spondylosis of lumbar region without myelopathy or radiculopathy 10/30/2011   TFC (triangular fibrocartilage complex) injury 02/25/2019   Thyroid nodule 08/11/2009   Findings: The thyroid gland is within normal limits in size.  The gland  is diffusely inhomogeneous. A small solid nodule is noted in the lower pole  medially on the right of 7 x 6 x 8 mm. A small solid nodule is noted inferiorly on the left of 3 x 3 x 4 mm.  IMPRESSION:  The thyroid gland is within normal limits in size with only small solid nodules present, the largest of only 8 mm in diameter on the right.     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    Trochanteric bursitis, right hip 04/26/2020   Tubular adenoma of colon 01/28/2021   Colonoscopy screening 4 mm tubular Adenoma polyp Charlott Rakes, MD [facility])   Tubular adenoma of colon 01/28/2021   Colonoscopy screening 4 mm tubular Adenoma polyp Charlott Rakes, MD [facility])   Vasomotor symptoms due to menopause 04/19/2017   Vitamin D deficiency 05/08/2017   Vulvitis 07/22/2020   Yeast vaginitis 07/11/2019   Past Surgical History:  Procedure Laterality Date   arthroscopy Left 01/2022   with SAD and DCR, K. Supple MD  Bladder dilitation     x 3   BREAST BIOPSY Right 2011   Benign histology   CARDIOVASCULAR STRESS TEST  2000   Unremarkable per pt report   CARPOMETACARPAL JOINT ARTHROTOMY Right 2011   COLONOSCOPY     COLONOSCOPY WITH PROPOFOL N/A 04/21/2015   Procedure: COLONOSCOPY WITH PROPOFOL;  Surgeon: Jeani Hawking, MD;  Location: [facility] ENDOSCOPY;  Service: Endoscopy;  Laterality: N/A;   CYSTOSCOPY W/ DILATION OF BLADDER N/A    EPIDURAL BLOCK INJECTION Left 04/12/2016   Left Medial Nerve Block and Left L5 ramus block, Dr Sheran Luz    EPIDURAL BLOCK INJECTION  03/21/2016   Left L3-4 medial branch block and Left L5 & dorsal ramus block    EPIDURAL BLOCK INJECTION N/A 10/25/2016   Sheran Luz, MD. Lumbar medial branch block   EPIDURAL BLOCK INJECTION N/A 02/09/2017   Sheran Luz, MD.  Bilateral L3/4 medial branch block, bilateral L5 dorsal ramus block   EPIDURAL BLOCK INJECTION N/A 07/04/2017   Sheran Luz, MD   FECAL  TRANSPLANT  04/21/2015   Procedure: FECAL TRANSPLANT;  Surgeon: Jeani Hawking, MD;  Location: [facility] ENDOSCOPY;  Service: Endoscopy;;   HIP ARTHROPLASTY Left    HIP ARTHROSCOPY Left 03/06/2018   Left hip arthroplasty, redo for loose hip arthroplasty. Procedure at [facility]   INJECTION HIP INTRA ARTICULAR Left 11/2015   for OA by Dr Vilma Prader IMPLANT PLACEMENT  04/2021   Floyde Parkins MD ([facility])   INTERSTIM IMPLANT REVISION N/A 09/2021   Floyde Parkins MD ([facility])   OTHER SURGICAL HISTORY Left 2016   Left L3/L4 medial nerve block and Left L5 Dorsal Ramus block Dr Rosine Abe   TOTAL HIP ARTHROPLASTY Left 07/25/2016   Procedure: LEFT TOTAL HIP ARTHROPLASTY ANTERIOR APPROACH;  Surgeon: Durene Romans, MD;  Location: [facility];  Service: Orthopedics;  Laterality: Left;   Patient Active Problem List   Diagnosis Date Noted   Vertigo 03/19/2023   Shingles 03/19/2023   Polyphagia 03/07/2023   Rash 02/07/2023   BMI 29.0-29.9,adult Current BMI 29.9 02/07/2023   Low serum vitamin B12 01/10/2023   OSA (obstructive sleep apnea) 12/27/2022   BMI 30.0-30.9,adult 12/27/2022   Severe obstructive sleep apnea-hypopnea syndrome 12/22/2022   Hyperglycemia 12/11/2022   Depression screening 12/11/2022   Generalized obesity 12/11/2022   SOBOE (shortness of breath on exertion) 12/11/2022   Other fatigue 12/11/2022   Sleep related headaches 11/07/2022   Intractable episodic cluster headache 11/07/2022   Sleep walking and eating 11/07/2022   Ambien use disorder, 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   Psychophysiological insomnia 11/07/2022   Rhinitis 11/03/2022   Hallux rigidus of right foot 09/25/2022   Morton neuroma, right 09/25/2022   Iliopsoas bursitis of left hip 05/08/2022   Chronic pain of right knee 05/08/2022   Arthrosis of hand 12/20/2021   History of Clostridioides difficile colitis, required fecal transplantation 12/20/2021   Mixed hyperlipidemia 12/20/2021    Meibomian gland dysfunction (MGD) of both eyes 10/17/2021   Nuclear sclerosis of both eyes 10/17/2021   Epiretinal membrane (ERM) of left eye 10/17/2021   OAB (overactive bladder) 03/02/2021   Osteoarthritis of acromioclavicular joint 02/22/2021   Hyperhidrosis, scalp, 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   Chronic low back pain 05/14/2019   Cervical spondylosis 11/18/2018   DDD (degenerative disc disease), lumbar 11/18/2018   Keratoconjunctivitis sicca (HCC) 11/18/2018   Chronic contact dermatitis 08/06/2018   Primary osteoarthritis of left knee 06/20/2018   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HCC)  11/29/2017   Insulin resistance 07/04/2017   Vitamin D deficiency 05/08/2017   Periodic limb movement sleep disorder 03/28/2017   Obesity (BMI 30.0-34.9) 01/29/2017   Other insomnia 11/09/2016   Irritable bowel syndrome with diarrhea 04/03/2016   Left ventricular hypertrophy, mild 02/25/2016   Mood disorder (HCC) 07/02/2015   Allergic rhinoconjunctivitis 07/02/2015   Fibromyalgia syndrome 07/01/2015   Personal history of colonic polyps 07/01/2015   GERD (gastroesophageal reflux disease) 12/16/2014   Hypothyroidism 05/15/2013   Pure hypercholesterolemia 05/15/2013   Chronic interstitial cystitis 05/15/2013   Chronic migraine without aura 05/15/2013    PCP: McDiarmid, Leighton Roach, MD   REFERRING PROVIDER: Molly Maduro, MD   REFERRING DIAG: M25.551 Pain in right hip  THERAPY DIAG:  Pain in right hip  Stiffness of right hip, not elsewhere classified  Muscle weakness (generalized)  Cramp and spasm  Difficulty in walking, not elsewhere classified  Rationale for Evaluation and Treatment: Rehabilitation  ONSET DATE: 12/11/22  SUBJECTIVE:   SUBJECTIVE STATEMENT: Pt reports she seems to be doing much better with the vertigo but its still sort of lingering.   She states her right hip is feeling good but she did get a little return of the bursa irritation when she went walking.  She is going to the beach  today and hopes to be able to walk with her sister.      PERTINENT HISTORY: Left hip THA PAIN:  04/03/23: Are you having pain?  2/10 Pain description:  sharp with walking Rt posterior- lateral hip, aggravated when hip is tired at the end of the day Pain is better at the start of the day.    PRECAUTIONS: None  WEIGHT BEARING RESTRICTIONS: No  FALLS:  Has patient fallen in last 6 months? No  LIVING ENVIRONMENT: Lives with: lives with their spouse Lives in: House/apartment   OCCUPATION: Retired, she has sleep apnea and tends to sleep in, gets up to do housework, goes out with friends.  She was walking her dog but unable currently due to the pain.   PLOF: Independent, Independent with basic ADLs, Independent with household mobility without device, Independent with community mobility without device, Independent with homemaking with ambulation, Independent with gait, and Independent with transfers  PATIENT GOALS: To get out of pain and be able to walk and be more active  NEXT MD VISIT: prn  OBJECTIVE:   DIAGNOSTIC FINDINGS:  Impression: Ultrasound Undersurface fraying and probable low-grade partial tearing of the  distal gluteus minimus tendon at the greater trochanter with  moderate subgluteus bursitis.  PATIENT SURVEYS:  Eval:  LEFS: 33/80  COGNITION: Overall cognitive status: Within functional limits for tasks assessed     SENSATION: WFL   MUSCLE LENGTH: Hamstrings: Right 50 deg; Left 60 deg Thomas test: Right Positive; Left Positive  POSTURE: No Significant postural limitations  PALPATION: Tender over lateral right hip at greater trochanter  LOWER EXTREMITY ROM:  [facility]  LOWER EXTREMITY MMT:  MMT Right eval Right 02/22/23 Left eval Left 02/22/23  Hip flexion 4+ 4+ 4+ 4+  Hip extension 3+ 4- 4 4  Hip abduction 3+ 4- 3+ 4-  Hip adduction 5 5 5 5   Hip internal rotation 4- 4 4- 4-  Hip external rotation 4- 4 4- 4   LOWER EXTREMITY SPECIAL TESTS:  Hip  special tests: Luisa Hart (FABER) test: positive , Trendelenburg test: positive , Thomas test: positive , and Ober's test: positive   FUNCTIONAL TESTS:  Eval: 5 times sit to stand: 11.67 sec Timed  up and go (TUG): 8.57 sec  02/22/23: 5 times sit to stand: 10.10 sec Timed up and go (TUG): 6.58 sec  GAIT: Distance walked: had worked up to approx 4 blocks Assistive device utilized:  oc and None Level of assistance: Complete Independence Comments: antalgic/trendelenburg   TODAY'S TREATMENT:     DATE: 04/03/2023 Nustep level 5 x 6 min with PT present to discuss status Supine lower trunk rotation x 20 Supine piriformis stretch x 2 each LE holding 20 sec each.   Supine IT band stretch 3 x 15 sec PPT x 10 PPT with 90/90 heel tap x 20 Trigger Point Dry-Needling  Treatment instructions: Expect mild to moderate muscle soreness. S/S of pneumothorax if dry needled over a lung field, and to seek immediate medical attention should they occur. Patient verbalized understanding of these instructions and education. Patient Consent Given: Yes Education handout provided: Yes Muscles treated: Glut max, medius and minimus, lateral quad right, hamstring right Electrical stimulation performed: No Parameters: N/A Treatment response/outcome: Skilled palpation used to identify taut bands and trigger points.  Once identified, dry needling techniques used to treat these areas.  Deep ache response ellicited along with verbal relief of symptoms.  "Wow, I can walk without pain? That feels great"  DATE: 03/27/2023 Nustep level 5 x 5 min with PT present to discuss status Supine lower trunk rotation x 20 Supine piriformis stretch x 2 each LE holding 20 sec each.   Trigger Point Dry-Needling  Treatment instructions: Expect mild to moderate muscle soreness. S/S of pneumothorax if dry needled over a lung field, and to seek immediate medical attention should they occur. Patient verbalized understanding of these  instructions and education. Patient Consent Given: Yes Education handout provided: Yes Muscles treated: Glut max, medius and minimus Electrical stimulation performed: No Parameters: N/A Treatment response/outcome: Skilled palpation used to identify taut bands and trigger points.  Once identified, dry needling techniques used to treat these areas.  Deep ache response ellicited along with verbal relief of symptoms.  "Wow, I can walk without pain? That feels great"  DATE: 03/21/2023 Nustep level 5 x6 min with PT present to discuss status Patient was having significant vertigo: inquired if we could do the [place] with her or run her through it but we did not have orders.  She had seen neuro PT for this previously.  We were able to locate her previous exercises and printed those out for her.  Seated piriformis stretch to right side 2x20 sec.   Seated hip abduction with yellow loop 2x10 Supine bridge 2x10 with 2 sec hold Supine 90/90 heel tap 2x5 with ab soreness reported Supine 90/90 isometric contraction 2x20 sec Sidelying reverse clamshell x10 bilat (stopped at one set secondary to pain) Sidelying clamshell 2x10 bilat (with cuing for decreased pacing and proper hip stacking/positioning)                                                                                                                     PATIENT EDUCATION:  Education details: Initiated HEP and educated on leg length discrepency vs muscle imbalance Person educated: Patient Education method: Programmer, multimedia, Facilities manager, Verbal cues, and Handouts Education comprehension: verbalized understanding, returned demonstration, and verbal cues required  HOME EXERCISE PROGRAM: Access Code: [email or web address] URL: [email or web address] Date: 02/13/2023 Prepared by: Mikey Kirschner  Exercises - Sidelying Hip Abduction  - 1 x daily - 7 x weekly - 2 sets - 10 reps - Clam with Resistance  - 1 x daily - 7 x weekly - 2 sets - 10 reps -  Sidelying Reverse Clamshell with Resistance  - 1 x daily - 7 x weekly - 2 sets - 10 reps - Prone Hip Extension  - 1 x daily - 7 x weekly - 2 sets - 10 reps - Standing Hamstring Stretch on Chair  - 1 x daily - 7 x weekly - 1 sets - 3 reps - 30 sec hold - Standing Hip Extension with Counter Support  - 2 x daily - 7 x weekly - 2 sets - 10 reps - Standing Hip Abduction with Anterior Support  - 2 x daily - 7 x weekly - 2 sets - 10 reps - Sit to Stand Without Arm Support  - 2 x daily - 7 x weekly - 2 sets - 10 reps - Staggered Stance Step Throughs (Mirrored)  - 1 x daily - 7 x weekly - 2 sets - 5 reps - Squat with Chair Touch  - 1 x daily - 7 x weekly - 2 sets - 5 reps - Half Deadlift with Kettlebell  - 1 x daily - 7 x weekly - 2 sets - 5 reps - Single Leg Cone Touch  - 1 x daily - 7 x weekly - 1 sets - 10 reps - Forward Reach with Kettlebell  - 1 x daily - 7 x weekly - 1 sets - 10 reps - Supine Posterior Pelvic Tilt  - 1 x daily - 7 x weekly - 1 sets - 20 reps - Supine 90/90 Alternating Heel Touches with Posterior Pelvic Tilt  - 1 x daily - 7 x weekly - 1 sets - 20 reps - Supine Dead Bug with Leg Extension  - 1 x daily - 7 x weekly - 1 sets - 20 reps  ASSESSMENT:  CLINICAL IMPRESSION: Shamir responded very well to dry needling.  She was able to walk further with minimal hip pain.  She admits that she did have a little return of the bursa irritation but overall, she seems to have had a very positive response to the dry needling.  We emphasized need to continue LE stretching and strengthening along with core stabilization.   She would benefit from continuing skilled PT for monitoring these symptoms and progressing core strength.    OBJECTIVE IMPAIRMENTS: Abnormal gait, difficulty walking, decreased ROM, decreased strength, increased fascial restrictions, increased muscle spasms, impaired flexibility, and pain.   ACTIVITY LIMITATIONS: bending, sitting, squatting, stairs, transfers, bed mobility, and  dressing  PARTICIPATION LIMITATIONS: meal prep, cleaning, laundry, driving, shopping, community activity, and yard work  PERSONAL FACTORS: Age, Fitness, and Past/current experiences are also affecting patient's functional outcome.   REHAB POTENTIAL: Good  CLINICAL DECISION MAKING: Stable/uncomplicated  EVALUATION COMPLEXITY: Low   GOALS: Goals reviewed with patient? Yes  SHORT TERM GOALS: Target date: 01/29/2023  Pain report to be no greater than 4/10  Baseline: Goal status: MET  2.  Patient will be independent with initial HEP  Baseline:  Goal status: MET   LONG TERM  GOALS: Target date: 02/26/2023  Patient to report pain no greater than 2/10  Baseline:  Goal status: MET 02/15/23  2.  Patient to be independent with advanced HEP  Baseline:  Goal status: MET 02/15/23  3.  Patient to be able to sleep through the night  Baseline:  Goal status: IN PROGRESS  4.  Patient to be able to stand or walk for at least 15 min without right hip pain Baseline:  Goal status: MET 02/15/23  5.  Functional scores to improve by 1-3 seconds Baseline:  Goal status: INITIAL  6.  Patient to report 85% improvement in overall symptoms  Baseline:  Goal status: INITIAL   PLAN:  PT FREQUENCY: 1x/week  PT DURATION: an additional  8 weeks  PLANNED INTERVENTIONS: Therapeutic exercises, Therapeutic activity, Neuromuscular re-education, Balance training, Gait training, Patient/Family education, Self Care, Joint mobilization, Stair training, DME instructions, Aquatic Therapy, Dry Needling, Electrical stimulation, Spinal mobilization, Cryotherapy, Moist heat, Taping, Traction, Ultrasound, Ionotophoresis 4mg /ml Dexamethasone, Manual therapy, and Re-evaluation  PLAN FOR NEXT SESSION:  Assess response to DN #2, Nustep, focus on bilateral hip stability training and core strength.  DN/Manual therapy as indicated   Keino Placencia B. Aquila Delaughter, PT 04/03/23 4:15 PM  [facility] [address], [address] [place], [place] [address] Phone # [phone] Fax [phone]

## 2023-04-06 ENCOUNTER — Ambulatory Visit (INDEPENDENT_AMBULATORY_CARE_PROVIDER_SITE_OTHER): Payer: Medicare Other | Admitting: Podiatry

## 2023-04-06 DIAGNOSIS — M25871 Other specified joint disorders, right ankle and foot: Secondary | ICD-10-CM

## 2023-04-06 NOTE — Progress Notes (Signed)
Subjective:  Patient ID: Molly Giles, female    DOB: February 15, 1956,  MRN: [id number]  Chief Complaint  Patient presents with   Toe Pain    Patient dropped a picture frame on right second toe around December 2023. She has been seeing [facility] for the issue. They obtained xray's and no fractures. Provider recommended MRI but patient has a wire near her bladder and she is unable to get an MRI done. Provider at [facility] stated that she may have a torn ligament to her plantar plate. Provider recommended surgery but patient wants a second opinion.     67 y.o. female presents with concern for pain in the right second toe around the base of the toe at the toe joint.  Patient has had pain since around December 2023.  She has seen Dr. Victorino Dike with [facility] for the issue.  She had x-rays taken and there were no fractures.  He recommended getting an MRI to evaluate for possible plantar plate injury.  Patient is unable to get MRI however due to a retained wire from a nerve stimulator near her bladder.  Dr. Victorino Dike recommended surgery to fix the problem to recreate the plantar plate.  She has had steroid injections around the second MPJ 1 which worked initially but the second 1 did not help at all.  She is currently wearing a Budin splint and taping the toe as instructed.  She says that it does help some but it is also uncomfortable to wear the splint and is trying to figure out the best way to take the toe down.  Past Medical History:  Diagnosis Date   Abdominal bloating 05/10/2021   Abdominal discomfort, generalized 05/07/2016   Acute gastritis 11/19/2020   Allergic rhinoconjunctivitis 07/02/2015   Anal fissure 11/19/2020   Anterior to posterior tear of superior glenoid labrum of left shoulder 02/22/2021   Arthritis    Benign positional vertigo 04/2015   Responded well to [facility]   Bruxism (teeth grinding)    Chronic contact dermatitis 08/06/2018   Per allergist Dr Edwyna Ready.     Chronic migraine 02/25/2016   Per Dr Lucia Gaskins review in notes from [facility] from September 2015. Showed total headache days last month 18. Severe headache days 7 days. Moderate headache days 5 days. Mild headache days last month sick days. Days without headache last month 10 days. Symptoms associated with photophobia, phonophobia, osmophobia, neck pain, dizziness, jaw pain, nasal congestion, vision disturbances, tingling and numbness, weakness and worsening with activity. Each headache attack last 3 hours depending on treatment in severity. Left side, the right side, easier side, the frontal area in the back of the head. Characterized as throbbing, pressure, tightness, squeezing, stabbing and burning    Chronic migraine without aura 05/15/2013    Dr Lucia Gaskins [facility]   Chronic tonsillitis 01/27/2019   DDD (degenerative disc disease), cervical 11/18/2018   DDD (degenerative disc disease), lumbar    Depression    Disc displacement, lumbar    Dysphagia 10/11/2021   Dysrhythmia    seen by dr Donnie Aho- not a problem since she has been on Bystolic   Episodic cluster headache, not intractable 03/06/2017   Essential hypertension 05/15/2013   Family history of adverse reaction to anesthesia    Brother- N/V   Family history of premature CAD 05/15/2013   Fibromyalgia syndrome 07/01/2015   Management by Dr Kathie Rhodes. Devashwar ([facility])    GERD (gastroesophageal reflux disease) 12/16/2014   H/O seasonal allergies  Hallux rigidus of right foot 09/25/2022   Hammer toe    Left great toe   Hashimoto's thyroiditis    Per patient, diagnosed by Dr. Ferdinand Cava   Hearing loss of both ears 07/27/2015   mild to borderline moderate low frequency hearing loss improving to within normal limits bilaterally on audiology testing at [facility] in November 2016.     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   Hyperlipidemia 1998   Hypokalemia due to excessive gastrointestinal loss of potassium 07/28/2019   Hypothyroidism    Impairment of balance 02/2015   Consequent of postconcussive syndrome   Injury of triangular fibrocartilage complex of left wrist 02/25/2019   Dx 02/25/19 Bradly Bienenstock IV MD ([facility])   Insulin resistance 07/04/2017   Internal hemorrhoid 01/28/2021   Internal hemorrhoid seen on colonoscopy 10/2020 Roderic Scarce MD [facility])   Interstitial cystitis    Irritable bowel syndrome with diarrhea 04/03/2016   Left ventricular hypertrophy, mild 02/25/2016   ECHOcardiogram report 06/17/15 showing EF55-60%, mild LVH and G1DD    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HCC) 03/10/2018   [facility]   Lumbar facet joint pain    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   Mood disorder (HCC)    Morbid obesity (HCC) 03/06/2017   Musculoskeletal neck pain 07/14/2015   Nocturnal hypoxemia 03/06/2017   Normal coronary arteries 05/14/2014   Obesity (BMI 30.0-34.9) 01/29/2017   Odynophagia 12/20/2021   Osteoarthritis of left hip 11/01/2015   MRI order by Dr Charlann Boxer (ortho) 10/2015 showed significant arthritis of left hip joint with cystic changes in femoral head c/w osteoarthritis   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, lumbar region 10/30/2011   Other insomnia 11/09/2016   Overweight    Pain in joint of left shoulder 11/09/2016   Pain in joint, multiple sites 11/18/2018   Palpitations 05/15/2013   [facility] manages   Perianal candidiasis 12/20/2021   Periodic limb movement sleep disorder 03/28/2017   Perirectal cyst 05/07/2016   Poison ivy dermatitis 03/24/2021   PONV (postoperative nausea and vomiting)    Positive ANA (antinuclear  antibody) 11/18/2018   Post concussion syndrome 06/06/2015   Post concussive syndrome 07/14/2015   Molly Giles's post-concussive syndrome manifesting in vertigo and headache, mood changes, poor balance, dizziness, and decreased concentration per Dr Lucia Gaskins at [facility].    Posterior vitreous detachment of right eye 2014   Premature menopause 12/20/2021   Rectal abscess 05/10/2021   S/P left THA, AA 07/25/2016   Sacroiliac inflammation (HCC) 08/18/2021   Shingles    Shortness of breath dyspnea    with exertion   Sicca syndrome (HCC) 11/18/2018   (+) ANA   Sleep walking and eating 03/06/2017   Snoring 03/06/2017   Spondylosis of lumbar region without myelopathy or radiculopathy 10/30/2011   TFC (triangular fibrocartilage complex) injury 02/25/2019   Thyroid nodule 08/11/2009   Findings: The thyroid gland is within normal limits in size.  The gland is diffusely inhomogeneous. A small solid nodule is noted in the lower pole  medially on the right of 7 x 6 x 8 mm. A small solid nodule is noted inferiorly on the left of 3 x 3 x  4 mm.  IMPRESSION:  The thyroid gland is within normal limits in size with only small solid nodules present, the largest of only 8 mm in diameter on the right.     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    Trochanteric bursitis, right hip 04/26/2020   Tubular adenoma of colon 01/28/2021   Colonoscopy screening 4 mm tubular Adenoma polyp Charlott Rakes, MD [facility])   Tubular adenoma of colon 01/28/2021   Colonoscopy screening 4 mm tubular Adenoma polyp Charlott Rakes, MD [facility])   Vasomotor symptoms due to menopause 04/19/2017   Vitamin D deficiency 05/08/2017   Vulvitis 07/22/2020   Yeast vaginitis 07/11/2019    Allergies  Allergen Reactions   Sulfa Antibiotics Itching   Chlorhexidine Gluconate Other (See Comments) and Rash    Burning   Codeine Nausea Only    "head nausea"     Levofloxacin Other (See Comments)    Pain in arm and behind ankles. Pain in arm and behind ankles.    Lyrica [Pregabalin] Other (See Comments)    Dizziness, nausea   Methylprednisolone     Flushing of the face   Prednisone     Flushing of the face   Betadine [Povidone Iodine] Rash    burning   Latex Itching and Rash    burning   Wellbutrin [Bupropion] Anxiety    ROS: Negative except as per HPI above  Objective:  General: AAO x3, NAD  Dermatological: With inspection and palpation of the right and left lower extremities there are no open sores, no preulcerative lesions, no rash or signs of infection present. Nails are of normal length thickness and coloration.   Vascular:  Dorsalis Pedis artery and Posterior Tibial artery pedal pulses are 2/4 bilateral.  Capillary fill time < 3 sec to all digits.   Neruologic: Grossly intact via light touch bilateral. Protective threshold intact to all sites bilateral.   Musculoskeletal: Pain with patient the plantar aspect of the second metatarsal phalange joint of the right foot.  This was at the location of the plantar plate.  The second toe does have mild dorsal subluxation at the MPJ and there is hypermobility and pain elicited with dorsal plantar forced motion of the second toe.  Overall findings consistent with possible plantar plate partial tearing or tear.  Early dorsal predislocation syndrome.  Mild hammer toe deformity of the second digit  Gait: Unassisted, Nonantalgic.   No images are attached to the encounter.  Radiographs:  Deferred Assessment:   1. 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  # Predislocation syndrome of second MPJ with likely plantar plate pathology -Discussed with the patient that I do agree with Dr. Victorino Dike and that her issue was probably plantar plate mediated pain -Evidence of predislocation syndrome with  dorsal elevation of the second toe at the second MPJ -Discussed conservative and surgical treatment options with the patient in detail -She is doing exactly what I would recommend conservatively including Budin splint usage and/or taping the toe in a plantarflexed position -Demonstrated Coban taping of the toe -Discussed surgical intervention which would include plantar plate reconstruction of the second MPJ with possible hammertoe correction at the same time as needed -Discussed the risk and benefits of the surgery.  Advised patient that I do agree with the surgical plan as previously discussed with Dr.  Hewitt.  Patient states she will likely want to proceed with surgical intervention with Dr. Victorino Dike and I agree with this plan of action.  All questions answered.  Patient to follow-up as needed          Corinna Gab, DPM [facility] / [facility]

## 2023-04-10 ENCOUNTER — Telehealth: Payer: Self-pay | Admitting: Neurology

## 2023-04-10 ENCOUNTER — Ambulatory Visit: Payer: Medicare Other | Admitting: Physical Therapy

## 2023-04-10 DIAGNOSIS — M25551 Pain in right hip: Secondary | ICD-10-CM

## 2023-04-10 DIAGNOSIS — M25651 Stiffness of right hip, not elsewhere classified: Secondary | ICD-10-CM

## 2023-04-10 DIAGNOSIS — G4733 Obstructive sleep apnea (adult) (pediatric): Secondary | ICD-10-CM

## 2023-04-10 DIAGNOSIS — M6281 Muscle weakness (generalized): Secondary | ICD-10-CM

## 2023-04-10 DIAGNOSIS — R252 Cramp and spasm: Secondary | ICD-10-CM

## 2023-04-10 NOTE — Telephone Encounter (Signed)
Let's try to appeal, it looks like since our office visit patient has been committed to improvement.

## 2023-04-10 NOTE — Therapy (Signed)
OUTPATIENT PHYSICAL THERAPY TREATMENT NOTE     Patient Name: Molly Giles MRN: [id number] DOB:April 19, 1956, 67 y.o., female Today's Date: 04/10/2023  END OF SESSION:  PT End of Session - 04/10/23 1150     Visit Number 15    Date for PT Re-Evaluation 04/19/23    Authorization Type Medicare A and B    Progress Note Due on Visit 20    PT Start Time 1147    PT Stop Time 1230    PT Time Calculation (min) 43 min    Activity Tolerance Patient tolerated treatment well              Past Medical History:  Diagnosis Date   Abdominal bloating 05/10/2021   Abdominal discomfort, generalized 05/07/2016   Acute gastritis 11/19/2020   Allergic rhinoconjunctivitis 07/02/2015   Anal fissure 11/19/2020   Anterior to posterior tear of superior glenoid labrum of left shoulder 02/22/2021   Arthritis    Benign positional vertigo 04/2015   Responded well to [facility]   Bruxism (teeth grinding)    Chronic contact dermatitis 08/06/2018   Per allergist Dr Edwyna Ready.    Chronic migraine 02/25/2016   Per Dr Lucia Gaskins review in notes from [facility] from September 2015. Showed total headache days last month 18. Severe headache days 7 days. Moderate headache days 5 days. Mild headache days last month sick days. Days without headache last month 10 days. Symptoms associated with photophobia, phonophobia, osmophobia, neck pain, dizziness, jaw pain, nasal congestion, vision disturbances, tingling and numbness, weakness and worsening with activity. Each headache attack last 3 hours depending on treatment in severity. Left side, the right side, easier side, the frontal area in the back of the head. Characterized as throbbing, pressure, tightness, squeezing, stabbing and burning    Chronic migraine without aura 05/15/2013    Dr Lucia Gaskins [facility]   Chronic tonsillitis 01/27/2019   DDD (degenerative disc disease), cervical 11/18/2018   DDD (degenerative disc  disease), lumbar    Depression    Disc displacement, lumbar    Dysphagia 10/11/2021   Dysrhythmia    seen by dr Donnie Aho- not a problem since she has been on Bystolic   Episodic cluster headache, not intractable 03/06/2017   Essential hypertension 05/15/2013   Family history of adverse reaction to anesthesia    Brother- N/V   Family history of premature CAD 05/15/2013   Fibromyalgia syndrome 07/01/2015   Management by Dr Kathie Rhodes. Devashwar (Rheum)    GERD (gastroesophageal reflux disease) 12/16/2014   H/O seasonal allergies    Hallux rigidus of right foot 09/25/2022   Hammer toe    Left great toe   Hashimoto's thyroiditis    Per patient, diagnosed by Dr. Ferdinand Cava   Hearing loss of both ears 07/27/2015   mild to borderline moderate low frequency hearing loss improving to within normal limits bilaterally on audiology testing at [facility] in November 2016.     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   Hyperlipidemia 1998   Hypokalemia due to excessive gastrointestinal loss of potassium 07/28/2019   Hypothyroidism    Impairment of balance 02/2015   Consequent of postconcussive syndrome   Injury  of triangular fibrocartilage complex of left wrist 02/25/2019   Dx 02/25/19 Bradly Bienenstock IV MD ([facility])   Insulin resistance 07/04/2017   Internal hemorrhoid 01/28/2021   Internal hemorrhoid seen on colonoscopy 10/2020 Roderic Scarce MD [facility])   Interstitial cystitis    Irritable bowel syndrome with diarrhea 04/03/2016   Left ventricular hypertrophy, mild 02/25/2016   ECHOcardiogram report 06/17/15 showing EF55-60%, mild LVH and G1DD    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HCC) 03/10/2018    [facility]   Lumbar facet joint pain    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   Mood disorder (HCC)    Morbid obesity (HCC) 03/06/2017   Musculoskeletal neck pain 07/14/2015   Nocturnal hypoxemia 03/06/2017   Normal coronary arteries 05/14/2014   Obesity (BMI 30.0-34.9) 01/29/2017   Odynophagia 12/20/2021   Osteoarthritis of left hip 11/01/2015   MRI order by Dr Charlann Boxer (ortho) 10/2015 showed significant arthritis of left hip joint with cystic changes in femoral head c/w osteoarthritis   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, lumbar region 10/30/2011   Other insomnia 11/09/2016   Overweight    Pain in joint of left shoulder 11/09/2016   Pain in joint, multiple sites 11/18/2018   Palpitations 05/15/2013   [facility] manages   Perianal candidiasis 12/20/2021   Periodic limb movement sleep disorder 03/28/2017   Perirectal cyst 05/07/2016   Poison ivy dermatitis 03/24/2021   PONV (postoperative nausea and vomiting)    Positive ANA (antinuclear antibody) 11/18/2018   Post concussion syndrome 06/06/2015   Post concussive syndrome 07/14/2015   Ms Canny's post-concussive syndrome manifesting in vertigo and headache, mood changes, poor balance, dizziness, and decreased concentration per Dr Lucia Gaskins at [facility].    Posterior vitreous detachment of right eye 2014   Premature menopause 12/20/2021   Rectal abscess 05/10/2021   S/P left THA, AA 07/25/2016   Sacroiliac inflammation (HCC) 08/18/2021   Shingles    Shortness of breath dyspnea    with exertion   Sicca syndrome (HCC) 11/18/2018   (+) ANA   Sleep walking and eating 03/06/2017   Snoring 03/06/2017   Spondylosis of lumbar region without myelopathy or radiculopathy 10/30/2011   TFC (triangular fibrocartilage complex) injury 02/25/2019   Thyroid nodule 08/11/2009   Findings: The thyroid gland is within normal limits in size.  The gland is diffusely inhomogeneous. A small solid nodule is  noted in the lower pole  medially on the right of 7 x 6 x 8 mm. A small solid nodule is noted inferiorly on the left of 3 x 3 x 4 mm.  IMPRESSION:  The thyroid gland is within normal limits in size with only small solid nodules present, the largest of only 8 mm in diameter on the right.     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    Trochanteric bursitis, right hip 04/26/2020   Tubular adenoma of colon 01/28/2021   Colonoscopy screening 4 mm tubular Adenoma polyp Charlott Rakes, MD [facility])   Tubular adenoma of colon 01/28/2021   Colonoscopy screening 4 mm tubular Adenoma polyp Charlott Rakes, MD [facility])   Vasomotor symptoms due to menopause 04/19/2017   Vitamin D deficiency 05/08/2017   Vulvitis 07/22/2020   Yeast vaginitis 07/11/2019   Past Surgical History:  Procedure Laterality Date   arthroscopy Left 01/2022   with SAD and DCR, K. Supple MD   Bladder dilitation     x 3  BREAST BIOPSY Right 2011   Benign histology   CARDIOVASCULAR STRESS TEST  2000   Unremarkable per pt report   CARPOMETACARPAL JOINT ARTHROTOMY Right 2011   COLONOSCOPY     COLONOSCOPY WITH PROPOFOL N/A 04/21/2015   Procedure: COLONOSCOPY WITH PROPOFOL;  Surgeon: Jeani Hawking, MD;  Location: [facility] ENDOSCOPY;  Service: Endoscopy;  Laterality: N/A;   CYSTOSCOPY W/ DILATION OF BLADDER N/A    EPIDURAL BLOCK INJECTION Left 04/12/2016   Left Medial Nerve Block and Left L5 ramus block, Dr Sheran Luz    EPIDURAL BLOCK INJECTION  03/21/2016   Left L3-4 medial branch block and Left L5 & dorsal ramus block    EPIDURAL BLOCK INJECTION N/A 10/25/2016   Sheran Luz, MD. Lumbar medial branch block   EPIDURAL BLOCK INJECTION N/A 02/09/2017   Sheran Luz, MD.  Bilateral L3/4 medial branch block, bilateral L5 dorsal ramus block   EPIDURAL BLOCK INJECTION N/A 07/04/2017   Sheran Luz, MD   FECAL TRANSPLANT  04/21/2015   Procedure: FECAL TRANSPLANT;   Surgeon: Jeani Hawking, MD;  Location: [facility] ENDOSCOPY;  Service: Endoscopy;;   HIP ARTHROPLASTY Left    HIP ARTHROSCOPY Left 03/06/2018   Left hip arthroplasty, redo for loose hip arthroplasty. Procedure at [facility] hospital   INJECTION HIP INTRA ARTICULAR Left 11/2015   for OA by Dr Vilma Prader IMPLANT PLACEMENT  04/2021   Floyde Parkins MD ([facility])   INTERSTIM IMPLANT REVISION N/A 09/2021   Floyde Parkins MD ([facility])   OTHER SURGICAL HISTORY Left 2016   Left L3/L4 medial nerve block and Left L5 Dorsal Ramus block Dr Rosine Abe   TOTAL HIP ARTHROPLASTY Left 07/25/2016   Procedure: LEFT TOTAL HIP ARTHROPLASTY ANTERIOR APPROACH;  Surgeon: Durene Romans, MD;  Location: [facility];  Service: Orthopedics;  Laterality: Left;   Patient Active Problem List   Diagnosis Date Noted   Vertigo 03/19/2023   Shingles 03/19/2023   Polyphagia 03/07/2023   Rash 02/07/2023   BMI 29.0-29.9,adult Current BMI 29.9 02/07/2023   Low serum vitamin B12 01/10/2023   OSA (obstructive sleep apnea) 12/27/2022   BMI 30.0-30.9,adult 12/27/2022   Severe obstructive sleep apnea-hypopnea syndrome 12/22/2022   Hyperglycemia 12/11/2022   Depression screening 12/11/2022   Generalized obesity 12/11/2022   SOBOE (shortness of breath on exertion) 12/11/2022   Other fatigue 12/11/2022   Sleep related headaches 11/07/2022   Intractable episodic cluster headache 11/07/2022   Sleep walking and eating 11/07/2022   Ambien use disorder, 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   Psychophysiological insomnia 11/07/2022   Rhinitis 11/03/2022   Hallux rigidus of right foot 09/25/2022   Morton neuroma, right 09/25/2022   Iliopsoas bursitis of left hip 05/08/2022   Chronic pain of right knee 05/08/2022   Arthrosis of hand 12/20/2021   History of Clostridioides difficile colitis, required fecal transplantation 12/20/2021   Mixed hyperlipidemia 12/20/2021   Meibomian gland dysfunction (MGD) of both eyes 10/17/2021    Nuclear sclerosis of both eyes 10/17/2021   Epiretinal membrane (ERM) of left eye 10/17/2021   OAB (overactive bladder) 03/02/2021   Osteoarthritis of acromioclavicular joint 02/22/2021   Hyperhidrosis, scalp, 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   Chronic low back pain 05/14/2019   Cervical spondylosis 11/18/2018   DDD (degenerative disc disease), lumbar 11/18/2018   Keratoconjunctivitis sicca (HCC) 11/18/2018   Chronic contact dermatitis 08/06/2018   Primary osteoarthritis of left knee 06/20/2018   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HCC) 11/29/2017   Insulin resistance 07/04/2017   Vitamin D  deficiency 05/08/2017   Periodic limb movement sleep disorder 03/28/2017   Obesity (BMI 30.0-34.9) 01/29/2017   Other insomnia 11/09/2016   Irritable bowel syndrome with diarrhea 04/03/2016   Left ventricular hypertrophy, mild 02/25/2016   Mood disorder (HCC) 07/02/2015   Allergic rhinoconjunctivitis 07/02/2015   Fibromyalgia syndrome 07/01/2015   Personal history of colonic polyps 07/01/2015   GERD (gastroesophageal reflux disease) 12/16/2014   Hypothyroidism 05/15/2013   Pure hypercholesterolemia 05/15/2013   Chronic interstitial cystitis 05/15/2013   Chronic migraine without aura 05/15/2013    PCP: McDiarmid, Leighton Roach, MD   REFERRING PROVIDER: Molly Maduro, MD   REFERRING DIAG: M25.551 Pain in right hip  THERAPY DIAG:  Pain in right hip  Stiffness of right hip, not elsewhere classified  Muscle weakness (generalized)  Cramp and spasm  Rationale for Evaluation and Treatment: Rehabilitation  ONSET DATE: 12/11/22  SUBJECTIVE:   SUBJECTIVE STATEMENT: [place] walking [place] the beach ( a short distance).   The hardest part was doing the stairs reciprocally.    Surgery on right foot 8/22    PERTINENT HISTORY: Left hip THA PAIN:  04/03/23: Are you having pain?  2/10 Pain description:  sharp with walking Rt posterior- lateral hip, aggravated when hip is tired at the end of the day Pain is better  at the start of the day.    PRECAUTIONS: None  WEIGHT BEARING RESTRICTIONS: No  FALLS:  Has patient fallen in last 6 months? No  LIVING ENVIRONMENT: Lives with: lives with their spouse Lives in: House/apartment   OCCUPATION: Retired, she has sleep apnea and tends to sleep in, gets up to do housework, goes out with friends.  She was walking her dog but unable currently due to the pain.   PLOF: Independent, Independent with basic ADLs, Independent with household mobility without device, Independent with community mobility without device, Independent with homemaking with ambulation, Independent with gait, and Independent with transfers  PATIENT GOALS: To get out of pain and be able to walk and be more active  NEXT MD VISIT: prn  OBJECTIVE:   DIAGNOSTIC FINDINGS:  Impression: Ultrasound Undersurface fraying and probable low-grade partial tearing of the  distal gluteus minimus tendon at the greater trochanter with  moderate subgluteus bursitis.  PATIENT SURVEYS:  Eval:  LEFS: 33/80  COGNITION: Overall cognitive status: Within functional limits for tasks assessed     SENSATION: WFL   MUSCLE LENGTH: Hamstrings: Right 50 deg; Left 60 deg Thomas test: Right Positive; Left Positive  POSTURE: No Significant postural limitations  PALPATION: Tender over lateral right hip at greater trochanter  LOWER EXTREMITY ROM:  [facility]  LOWER EXTREMITY MMT:  MMT Right eval Right 02/22/23 Left eval Left 02/22/23  Hip flexion 4+ 4+ 4+ 4+  Hip extension 3+ 4- 4 4  Hip abduction 3+ 4- 3+ 4-  Hip adduction 5 5 5 5   Hip internal rotation 4- 4 4- 4-  Hip external rotation 4- 4 4- 4   LOWER EXTREMITY SPECIAL TESTS:  Hip special tests: Luisa Hart (FABER) test: positive , Trendelenburg test: positive , Thomas test: positive , and Ober's test: positive   FUNCTIONAL TESTS:  Eval: 5 times sit to stand: 11.67 sec Timed up and go (TUG): 8.57 sec  02/22/23: 5 times sit to stand: 10.10  sec Timed up and go (TUG): 6.58 sec  GAIT: Distance walked: had worked up to approx 4 blocks Assistive device utilized:  oc and None Level of assistance: Complete Independence Comments: antalgic/trendelenburg   TODAY'S TREATMENT:  DATE: 04/03/2023 Nustep level 5 x 6 min with PT present to discuss status 20 leg swings off 4 inch step right only (forward/back) 10 right hip abduction  12 mountain climbers at the counter Dead lift 10# x10 (cue for hips back) Standing clamshell x10 each side 1 minute 4 inch lateral step up and overs  10x Resisted cable walk backwards 15# Trigger Point Dry-Needling  Treatment instructions: Expect mild to moderate muscle soreness. S/S of pneumothorax if dry needled over a lung field, and to seek immediate medical attention should they occur. Patient verbalized understanding of these instructions and education. Patient Consent Given: Yes Education handout provided: Yes Muscles treated: Glut max, medius and minimus in sidelying Electrical stimulation performed: No Parameters: N/A Treatment response/outcome: Skilled palpation used to identify taut bands and trigger points.  Once identified, dry needling techniques used to treat these areas.  Deep ache response ellicited along with verbal relief of symptoms.    DATE: 04/03/2023 Nustep level 5 x 6 min with PT present to discuss status Supine lower trunk rotation x 20 Supine piriformis stretch x 2 each LE holding 20 sec each.   Supine IT band stretch 3 x 15 sec PPT x 10 PPT with 90/90 heel tap x 20 Trigger Point Dry-Needling  Treatment instructions: Expect mild to moderate muscle soreness. S/S of pneumothorax if dry needled over a lung field, and to seek immediate medical attention should they occur. Patient verbalized understanding of these instructions and education. Patient Consent Given: Yes Education handout provided: Yes Muscles treated: Glut max, medius and minimus, lateral quad right, hamstring  right Electrical stimulation performed: No Parameters: N/A Treatment response/outcome: Skilled palpation used to identify taut bands and trigger points.  Once identified, dry needling techniques used to treat these areas.  Deep ache response ellicited along with verbal relief of symptoms.  "Wow, I can walk without pain? That feels great"  DATE: 03/27/2023 Nustep level 5 x 5 min with PT present to discuss status Supine lower trunk rotation x 20 Supine piriformis stretch x 2 each LE holding 20 sec each.   Trigger Point Dry-Needling  Treatment instructions: Expect mild to moderate muscle soreness. S/S of pneumothorax if dry needled over a lung field, and to seek immediate medical attention should they occur. Patient verbalized understanding of these instructions and education. Patient Consent Given: Yes Education handout provided: Yes Muscles treated: Glut max, medius and minimus Electrical stimulation performed: No Parameters: N/A Treatment response/outcome: Skilled palpation used to identify taut bands and trigger points.  Once identified, dry needling techniques used to treat these areas.  Deep ache response ellicited along with verbal relief of symptoms.  "Wow, I can walk without pain? That feels great"                                                                                                                     PATIENT EDUCATION:  Education details: Initiated HEP and educated on leg length discrepency vs muscle imbalance Person educated: Patient Education method: Explanation,  Demonstration, Verbal cues, and Handouts Education comprehension: verbalized understanding, returned demonstration, and verbal cues required  HOME EXERCISE PROGRAM: Access Code: [email or web address] URL: [email or web address] Date: 02/13/2023 Prepared by: Mikey Kirschner  Exercises - Sidelying Hip Abduction  - 1 x daily - 7 x weekly - 2 sets - 10 reps - Clam with Resistance  - 1 x daily - 7 x weekly - 2  sets - 10 reps - Sidelying Reverse Clamshell with Resistance  - 1 x daily - 7 x weekly - 2 sets - 10 reps - Prone Hip Extension  - 1 x daily - 7 x weekly - 2 sets - 10 reps - Standing Hamstring Stretch on Chair  - 1 x daily - 7 x weekly - 1 sets - 3 reps - 30 sec hold - Standing Hip Extension with Counter Support  - 2 x daily - 7 x weekly - 2 sets - 10 reps - Standing Hip Abduction with Anterior Support  - 2 x daily - 7 x weekly - 2 sets - 10 reps - Sit to Stand Without Arm Support  - 2 x daily - 7 x weekly - 2 sets - 10 reps - Staggered Stance Step Throughs (Mirrored)  - 1 x daily - 7 x weekly - 2 sets - 5 reps - Squat with Chair Touch  - 1 x daily - 7 x weekly - 2 sets - 5 reps - Half Deadlift with Kettlebell  - 1 x daily - 7 x weekly - 2 sets - 5 reps - Single Leg Cone Touch  - 1 x daily - 7 x weekly - 1 sets - 10 reps - Forward Reach with Kettlebell  - 1 x daily - 7 x weekly - 1 sets - 10 reps - Supine Posterior Pelvic Tilt  - 1 x daily - 7 x weekly - 1 sets - 20 reps - Supine 90/90 Alternating Heel Touches with Posterior Pelvic Tilt  - 1 x daily - 7 x weekly - 1 sets - 20 reps - Supine Dead Bug with Leg Extension  - 1 x daily - 7 x weekly - 1 sets - 20 reps  ASSESSMENT:  CLINICAL IMPRESSION: Treatment emphasis on strengthening glute muscles.  Muscular fatigue with single standing on right left but also painful on right foot (upcoming surgery).  Although the patient finds DN to be painful she feels the positive results are worth the temporary discomfort.  Decreased tender points, improved soft tissue mobility and decreased pain reported immediately post session.    OBJECTIVE IMPAIRMENTS: Abnormal gait, difficulty walking, decreased ROM, decreased strength, increased fascial restrictions, increased muscle spasms, impaired flexibility, and pain.   ACTIVITY LIMITATIONS: bending, sitting, squatting, stairs, transfers, bed mobility, and dressing  PARTICIPATION LIMITATIONS: meal prep,  cleaning, laundry, driving, shopping, community activity, and yard work  PERSONAL FACTORS: Age, Fitness, and Past/current experiences are also affecting patient's functional outcome.   REHAB POTENTIAL: Good  CLINICAL DECISION MAKING: Stable/uncomplicated  EVALUATION COMPLEXITY: Low   GOALS: Goals reviewed with patient? Yes  SHORT TERM GOALS: Target date: 01/29/2023  Pain report to be no greater than 4/10  Baseline: Goal status: MET  2.  Patient will be independent with initial HEP  Baseline:  Goal status: MET   LONG TERM GOALS: Target date: 04/19/2023  Patient to report pain no greater than 2/10  Baseline:  Goal status: MET 02/15/23  2.  Patient to be independent with advanced HEP  Baseline:  Goal status: MET 02/15/23  3.  Patient to be able to sleep through the night  Baseline:  Goal status: IN PROGRESS  4.  Patient to be able to stand or walk for at least 15 min without right hip pain Baseline:  Goal status: MET 02/15/23  5.  Functional scores to improve by 1-3 seconds Baseline:  Goal status: INITIAL  6.  Patient to report 85% improvement in overall symptoms  Baseline:  Goal status: INITIAL   PLAN:  PT FREQUENCY: 1x/week  PT DURATION: an additional  8 weeks  PLANNED INTERVENTIONS: Therapeutic exercises, Therapeutic activity, Neuromuscular re-education, Balance training, Gait training, Patient/Family education, Self Care, Joint mobilization, Stair training, DME instructions, Aquatic Therapy, Dry Needling, Electrical stimulation, Spinal mobilization, Cryotherapy, Moist heat, Taping, Traction, Ultrasound, Ionotophoresis 4mg /ml Dexamethasone, Manual therapy, and Re-evaluation  PLAN FOR NEXT SESSION:  Assess response to DN, Nustep, focus on bilateral hip stability training and core strength.  DN/Manual therapy as indicated; upcoming foot surgery   Lavinia Sharps, PT 04/10/23 5:52 PM Phone: [phone] Fax: [phone] [facility] [address] [place], [place] [address] Phone # [phone] Fax [phone]

## 2023-04-10 NOTE — Telephone Encounter (Signed)
Pt called needing to speak to the provider regarding her cpap machine. Pt states that her DME has informed her that she will need to pay for the machine or turn it back in due to non compliance. Please advise.

## 2023-04-12 ENCOUNTER — Ambulatory Visit: Payer: Medicare Other | Admitting: Rehabilitative and Restorative Service Providers"

## 2023-04-14 ENCOUNTER — Other Ambulatory Visit (INDEPENDENT_AMBULATORY_CARE_PROVIDER_SITE_OTHER): Payer: Self-pay | Admitting: Family Medicine

## 2023-04-14 DIAGNOSIS — E559 Vitamin D deficiency, unspecified: Secondary | ICD-10-CM

## 2023-04-16 ENCOUNTER — Other Ambulatory Visit: Payer: Self-pay | Admitting: Family Medicine

## 2023-04-16 DIAGNOSIS — Z1231 Encounter for screening mammogram for malignant neoplasm of breast: Secondary | ICD-10-CM

## 2023-04-16 NOTE — Telephone Encounter (Signed)
Pt called needing to speak to the RN regarding when she can restart the sleep study to get her cpap machine back. Please advise.

## 2023-04-19 ENCOUNTER — Ambulatory Visit: Payer: Medicare Other

## 2023-04-19 ENCOUNTER — Ambulatory Visit (INDEPENDENT_AMBULATORY_CARE_PROVIDER_SITE_OTHER): Payer: Medicare Other | Admitting: Family Medicine

## 2023-04-24 ENCOUNTER — Encounter: Payer: Self-pay | Admitting: Rehabilitative and Restorative Service Providers"

## 2023-04-24 ENCOUNTER — Ambulatory Visit: Payer: Medicare Other | Attending: Physician Assistant | Admitting: Rehabilitative and Restorative Service Providers"

## 2023-04-24 ENCOUNTER — Telehealth: Payer: Self-pay | Admitting: Neurology

## 2023-04-24 ENCOUNTER — Other Ambulatory Visit: Payer: Self-pay

## 2023-04-24 DIAGNOSIS — R42 Dizziness and giddiness: Secondary | ICD-10-CM | POA: Diagnosis present

## 2023-04-24 DIAGNOSIS — M542 Cervicalgia: Secondary | ICD-10-CM | POA: Diagnosis present

## 2023-04-24 DIAGNOSIS — H8112 Benign paroxysmal vertigo, left ear: Secondary | ICD-10-CM | POA: Diagnosis present

## 2023-04-24 NOTE — Therapy (Signed)
OUTPATIENT PHYSICAL THERAPY VESTIBULAR EVALUATION     Patient Name: Molly Giles MRN: [id number] DOB:Feb 01, 1956, 67 y.o., female Today's Date: 04/24/2023  END OF SESSION:  PT End of Session - 04/24/23 1709     Visit Number 1    Number of Visits 8    Date for PT Re-Evaluation 06/23/23    Authorization Type Medicare A and B    Progress Note Due on Visit 10    PT Start Time 1404    PT Stop Time 1449    PT Time Calculation (min) 45 min    Activity Tolerance Patient tolerated treatment well    Behavior During Therapy [facility] for tasks assessed/performed            Past Medical History:  Diagnosis Date   Abdominal bloating 05/10/2021   Abdominal discomfort, generalized 05/07/2016   Acute gastritis 11/19/2020   Allergic rhinoconjunctivitis 07/02/2015   Anal fissure 11/19/2020   Anterior to posterior tear of superior glenoid labrum of left shoulder 02/22/2021   Arthritis    Benign positional vertigo 04/2015   Responded well to Vestibular Rehab   Bruxism (teeth grinding)    Chronic contact dermatitis 08/06/2018   Per allergist Dr Edwyna Ready.    Chronic migraine 02/25/2016   Per Dr Lucia Gaskins review in notes from [facility] from September 2015. Showed total headache days last month 18. Severe headache days 7 days. Moderate headache days 5 days. Mild headache days last month sick days. Days without headache last month 10 days. Symptoms associated with photophobia, phonophobia, osmophobia, neck pain, dizziness, jaw pain, nasal congestion, vision disturbances, tingling and numbness, weakness and worsening with activity. Each headache attack last 3 hours depending on treatment in severity. Left side, the right side, easier side, the frontal area in the back of the head. Characterized as throbbing, pressure, tightness, squeezing, stabbing and burning    Chronic migraine without aura 05/15/2013    Dr Lucia Gaskins [facility]   Chronic tonsillitis 01/27/2019    DDD (degenerative disc disease), cervical 11/18/2018   DDD (degenerative disc disease), lumbar    Depression    Disc displacement, lumbar    Dysphagia 10/11/2021   Dysrhythmia    seen by dr Donnie Aho- not a problem since she has been on Bystolic   Episodic cluster headache, not intractable 03/06/2017   Essential hypertension 05/15/2013   Family history of adverse reaction to anesthesia    Brother- N/V   Family history of premature CAD 05/15/2013   Fibromyalgia syndrome 07/01/2015   Management by Dr Kathie Rhodes. Devashwar (Rheum)    GERD (gastroesophageal reflux disease) 12/16/2014   H/O seasonal allergies    Hallux rigidus of right foot 09/25/2022   Hammer toe    Left great toe   Hashimoto's thyroiditis    Per patient, diagnosed by Dr. Ferdinand Cava   Hearing loss of both ears 07/27/2015   mild to borderline moderate low frequency hearing loss improving to within normal limits bilaterally on audiology testing at [facility] in November 2016.     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   Hyperlipidemia 1998   Hypokalemia due to excessive gastrointestinal loss of potassium 07/28/2019   Hypothyroidism  Impairment of balance 02/2015   Consequent of postconcussive syndrome   Injury of triangular fibrocartilage complex of left wrist 02/25/2019   Dx 02/25/19 Bradly Bienenstock IV MD ([facility])   Insulin resistance 07/04/2017   Internal hemorrhoid 01/28/2021   Internal hemorrhoid seen on colonoscopy 10/2020 Roderic Scarce MD [facility])   Interstitial cystitis    Irritable bowel syndrome with diarrhea 04/03/2016   Left ventricular hypertrophy, mild 02/25/2016   ECHOcardiogram report 06/17/15  showing EF55-60%, mild LVH and G1DD    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HCC) 03/10/2018   [facility]   Lumbar facet joint pain    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   Mood disorder (HCC)    Morbid obesity (HCC) 03/06/2017   Musculoskeletal neck pain 07/14/2015   Nocturnal hypoxemia 03/06/2017   Normal coronary arteries 05/14/2014   Obesity (BMI 30.0-34.9) 01/29/2017   Odynophagia 12/20/2021   Osteoarthritis of left hip 11/01/2015   MRI order by Dr Charlann Boxer (ortho) 10/2015 showed significant arthritis of left hip joint with cystic changes in femoral head c/w osteoarthritis   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, lumbar region 10/30/2011   Other insomnia 11/09/2016   Overweight    Pain in joint of left shoulder 11/09/2016   Pain in joint, multiple sites 11/18/2018   Palpitations 05/15/2013   [facility] manages   Perianal candidiasis 12/20/2021   Periodic limb movement sleep disorder 03/28/2017   Perirectal cyst 05/07/2016   Poison ivy dermatitis 03/24/2021   PONV (postoperative nausea and vomiting)    Positive ANA (antinuclear antibody) 11/18/2018   Post concussion syndrome 06/06/2015   Post concussive syndrome 07/14/2015   Ms Bouffard's post-concussive syndrome manifesting in vertigo and headache, mood changes, poor balance, dizziness, and decreased concentration per Dr Lucia Gaskins at [facility].    Posterior vitreous detachment of right eye 2014   Premature menopause 12/20/2021   Rectal abscess 05/10/2021   S/P left THA, AA 07/25/2016   Sacroiliac inflammation (HCC) 08/18/2021   Shingles    Shortness of breath dyspnea    with exertion   Sicca syndrome (HCC) 11/18/2018   (+) ANA   Sleep walking and eating 03/06/2017   Snoring 03/06/2017   Spondylosis of lumbar region without myelopathy or radiculopathy 10/30/2011   TFC (triangular fibrocartilage complex) injury 02/25/2019   Thyroid nodule 08/11/2009   Findings: The thyroid gland is within  normal limits in size.  The gland is diffusely inhomogeneous. A small solid nodule is noted in the lower pole  medially on the right of 7 x 6 x 8 mm. A small solid nodule is noted inferiorly on the left of 3 x 3 x 4 mm.  IMPRESSION:  The thyroid gland is within normal limits in size with only small solid nodules present, the largest of only 8 mm in diameter on the right.     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    Trochanteric bursitis, right hip 04/26/2020   Tubular adenoma of colon 01/28/2021   Colonoscopy screening 4 mm tubular Adenoma polyp Charlott Rakes, MD [facility])   Tubular adenoma of colon 01/28/2021   Colonoscopy screening 4 mm tubular Adenoma polyp Charlott Rakes, MD [facility])   Vasomotor symptoms due to menopause 04/19/2017   Vitamin D deficiency 05/08/2017   Vulvitis 07/22/2020   Yeast vaginitis 07/11/2019   Past Surgical History:  Procedure Laterality Date   arthroscopy Left 01/2022   with SAD and DCR, K.  Supple MD   Bladder dilitation     x 3   BREAST BIOPSY Right 2011   Benign histology   CARDIOVASCULAR STRESS TEST  2000   Unremarkable per pt report   CARPOMETACARPAL JOINT ARTHROTOMY Right 2011   COLONOSCOPY     COLONOSCOPY WITH PROPOFOL N/A 04/21/2015   Procedure: COLONOSCOPY WITH PROPOFOL;  Surgeon: Jeani Hawking, MD;  Location: [facility] ENDOSCOPY;  Service: Endoscopy;  Laterality: N/A;   CYSTOSCOPY W/ DILATION OF BLADDER N/A    EPIDURAL BLOCK INJECTION Left 04/12/2016   Left Medial Nerve Block and Left L5 ramus block, Dr Sheran Luz    EPIDURAL BLOCK INJECTION  03/21/2016   Left L3-4 medial branch block and Left L5 & dorsal ramus block    EPIDURAL BLOCK INJECTION N/A 10/25/2016   Sheran Luz, MD. Lumbar medial branch block   EPIDURAL BLOCK INJECTION N/A 02/09/2017   Sheran Luz, MD.  Bilateral L3/4 medial branch block, bilateral L5 dorsal ramus block   EPIDURAL BLOCK INJECTION N/A 07/04/2017    Sheran Luz, MD   FECAL TRANSPLANT  04/21/2015   Procedure: FECAL TRANSPLANT;  Surgeon: Jeani Hawking, MD;  Location: [facility] ENDOSCOPY;  Service: Endoscopy;;   HIP ARTHROPLASTY Left    HIP ARTHROSCOPY Left 03/06/2018   Left hip arthroplasty, redo for loose hip arthroplasty. Procedure at [facility]   INJECTION HIP INTRA ARTICULAR Left 11/2015   for OA by Dr Vilma Prader IMPLANT PLACEMENT  04/2021   Floyde Parkins MD ([facility])   INTERSTIM IMPLANT REVISION N/A 09/2021   Floyde Parkins MD ([facility])   OTHER SURGICAL HISTORY Left 2016   Left L3/L4 medial nerve block and Left L5 Dorsal Ramus block Dr Rosine Abe   TOTAL HIP ARTHROPLASTY Left 07/25/2016   Procedure: LEFT TOTAL HIP ARTHROPLASTY ANTERIOR APPROACH;  Surgeon: Durene Romans, MD;  Location: [facility];  Service: Orthopedics;  Laterality: Left;   Patient Active Problem List   Diagnosis Date Noted   Vertigo 03/19/2023   Shingles 03/19/2023   Polyphagia 03/07/2023   Rash 02/07/2023   BMI 29.0-29.9,adult Current BMI 29.9 02/07/2023   Low serum vitamin B12 01/10/2023   OSA (obstructive sleep apnea) 12/27/2022   BMI 30.0-30.9,adult 12/27/2022   Severe obstructive sleep apnea-hypopnea syndrome 12/22/2022   Hyperglycemia 12/11/2022   Depression screening 12/11/2022   Generalized obesity 12/11/2022   SOBOE (shortness of breath on exertion) 12/11/2022   Other fatigue 12/11/2022   Sleep related headaches 11/07/2022   Intractable episodic cluster headache 11/07/2022   Sleep walking and eating 11/07/2022   Ambien use disorder, 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   Psychophysiological insomnia 11/07/2022   Rhinitis 11/03/2022   Hallux rigidus of right foot 09/25/2022   Morton neuroma, right 09/25/2022   Iliopsoas bursitis of left hip 05/08/2022   Chronic pain of right knee 05/08/2022   Arthrosis of hand 12/20/2021   History of Clostridioides difficile colitis, required fecal transplantation 12/20/2021   Mixed  hyperlipidemia 12/20/2021   Meibomian gland dysfunction (MGD) of both eyes 10/17/2021   Nuclear sclerosis of both eyes 10/17/2021   Epiretinal membrane (ERM) of left eye 10/17/2021   OAB (overactive bladder) 03/02/2021   Osteoarthritis of acromioclavicular joint 02/22/2021   Hyperhidrosis, scalp, 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   Chronic low back pain 05/14/2019   Cervical spondylosis 11/18/2018   DDD (degenerative disc disease), lumbar 11/18/2018   Keratoconjunctivitis sicca (HCC) 11/18/2018   Chronic contact dermatitis 08/06/2018   Primary osteoarthritis of left knee 06/20/2018   Loose  total hip arthroplasty (HCC) 11/29/2017   Insulin resistance 07/04/2017   Vitamin D deficiency 05/08/2017   Periodic limb movement sleep disorder 03/28/2017   Obesity (BMI 30.0-34.9) 01/29/2017   Other insomnia 11/09/2016   Irritable bowel syndrome with diarrhea 04/03/2016   Left ventricular hypertrophy, mild 02/25/2016   Mood disorder (HCC) 07/02/2015   Allergic rhinoconjunctivitis 07/02/2015   Fibromyalgia syndrome 07/01/2015   Personal history of colonic polyps 07/01/2015   GERD (gastroesophageal reflux disease) 12/16/2014   Hypothyroidism 05/15/2013   Pure hypercholesterolemia 05/15/2013   Chronic interstitial cystitis 05/15/2013   Chronic migraine without aura 05/15/2013    PCP: Tawanna Cooler McDiarmid, MD REFERRING PROVIDER: Caro Laroche, DO   REFERRING DIAG: R42 (ICD-10-CM) - Vertigo   THERAPY DIAG:  Dizziness and giddiness  BPPV (benign paroxysmal positional vertigo), left  ONSET DATE: 03/19/23  Rationale for Evaluation and Treatment: Rehabilitation  SUBJECTIVE:  SUBJECTIVE STATEMENT: The patient had a sudden onset of dizziness in July when at [facility]. She was changing in a dressing room and stood up quickly with room spinning. The initial phase of vertigo lasted x days and she had to cancel a trip. She also hurt her neck and shoulder during that time. She notes dizziness with rolling to the left  that lasts for a few seconds.  Pt accompanied by: self  PERTINENT HISTORY: Migraines  PAIN:  Are you having pain? Hip and toe pain-- she just finished with PT.  We will not address unless it limits   PRECAUTIONS: None  WEIGHT BEARING RESTRICTIONS: No  FALLS: Has patient fallen in last 6 months? No  LIVING ENVIRONMENT: Lives with: lives with their spouse Lives in: House/apartment  PLOF: Independent  PATIENT GOALS: reduce dizziness  OBJECTIVE:  Cervical ROM:   Active A/PROM (deg) eval  Flexion WFLs  Extension WFLs  Right lateral flexion WFLs  Left lateral flexion WFLs  Right rotation WFLs  Left rotation WFLs  (Blank rows = not tested)  PATIENT SURVEYS:  Was not captured at eval for FOTO  VESTIBULAR ASSESSMENT:  GENERAL OBSERVATION: The patient ambulates into clinic independently.    SYMPTOM BEHAVIOR:  Subjective history: Sudden onset of dizziness, has h/o dizziness x years  Non-Vestibular symptoms: migraine symptoms  Type of dizziness: Spinning/Vertigo  Frequency: daily  Duration: seconds at this time (was minutes at onset)  Aggravating factors:  rolling to the left  Relieving factors: head stationary  Progression of symptoms: better  OCULOMOTOR EXAM:  Ocular Alignment: normal  Ocular ROM: No Limitations  Spontaneous Nystagmus: absent  Gaze-Induced Nystagmus: absent  Smooth Pursuits: intact  Saccades: intact   VESTIBULAR - OCULAR REFLEX:   Slow VOR: Normal  Head-Impulse Test: HIT Right: negative HIT Left: negative  POSITIONAL TESTING: Right Dix-Hallpike: no nystagmus and sensation of fogginess worsens Left Dix-Hallpike: no nystagmus and return to sitting provokes an unsteadiness and patient needs to rest Right Roll Test: no nystagmus Left Roll Test: note a trace of upbeat, rotary nystagmus   [facility] Adult PT Treatment:                                                DATE: 04/24/23  Canalith Repositioning:  Epley Left: Number of Reps: 2 and Comment:  symptoms improved second rep  PATIENT EDUCATION: Education details: nature of condition, plan for treatment Person educated: Patient Education method: Explanation Education comprehension: verbalized understanding  HOME EXERCISE PROGRAM: None at eval  GOALS: Goals reviewed with patient? Yes  SHORT TERM GOALS: Target date: 05/24/23  The patient will be indep with habituation HEP. Baseline: to provide HEP Goal status: INITIAL   LONG TERM GOALS: Target date: 06/23/23  The patient will report bed mobility without episodes of spinning. Baseline:  Spinning with L roll Goal status: INITIAL  2.  The patient will have negative positional testing. Baseline:  + L horizontal roll and noted nystagmus when entering into 1st position with epley's Goal status: INITIAL  3.  The patient will verbalize understanding of self mgmt of symptoms.  Baseline:  to review home treatment for future mgmt Goal status: INITIAL   ASSESSMENT: CLINICAL IMPRESSION: Patient is a 67 y.o. female who was seen today for physical therapy evaluation and treatment for vertigo. She is known to me from prior treatment for BPPV and vestibular issues. She presents today with + L BPPV initially noted with rotary nystagmus with L horizontal roll. She responded well to L epley's x 2 reps today. She also does get imbalance and HA at times-- PT to monitor and adjust treatment as needed.    OBJECTIVE IMPAIRMENTS: decreased balance and dizziness.   ACTIVITY LIMITATIONS: bending and bed mobility  PARTICIPATION LIMITATIONS: community activity  PERSONAL FACTORS: 1-2 comorbidities: h/o concussion, h/o migraines  are also affecting patient's functional outcome.   REHAB POTENTIAL: Good  CLINICAL DECISION MAKING: Stable/uncomplicated  EVALUATION COMPLEXITY: Low  PLAN:  PT FREQUENCY: 1x/week  PT DURATION: 8 weeks  PLANNED INTERVENTIONS: Therapeutic exercises, Therapeutic activity, Neuromuscular re-education, Balance  training, Gait training, Patient/Family education, Self Care, Joint mobilization, Vestibular training, Canalith repositioning, Dry Needling, and Manual therapy  PLAN FOR NEXT SESSION: recheck BPPV and treat, initiate HEP for habituation.   Keiji Melland, PT 04/24/2023, 5:10 PM

## 2023-04-24 NOTE — Telephone Encounter (Signed)
NPSG- Medicare/BCBS supp no auth req.  Sent patient a [employer] geologist.

## 2023-04-24 NOTE — Addendum Note (Signed)
Addended by: Judi Cong on: 04/24/2023 10:01 AM   Modules accepted: Orders

## 2023-04-26 ENCOUNTER — Other Ambulatory Visit: Payer: Self-pay | Admitting: Family Medicine

## 2023-04-26 DIAGNOSIS — E038 Other specified hypothyroidism: Secondary | ICD-10-CM

## 2023-04-27 ENCOUNTER — Other Ambulatory Visit: Payer: Self-pay

## 2023-04-27 ENCOUNTER — Encounter (HOSPITAL_BASED_OUTPATIENT_CLINIC_OR_DEPARTMENT_OTHER): Payer: Self-pay | Admitting: Orthopedic Surgery

## 2023-04-30 ENCOUNTER — Other Ambulatory Visit: Payer: Self-pay | Admitting: Family Medicine

## 2023-04-30 DIAGNOSIS — E039 Hypothyroidism, unspecified: Secondary | ICD-10-CM

## 2023-04-30 NOTE — Progress Notes (Signed)
Repeat TSH ordered to follow up sl. High one in May 2024 [facility]

## 2023-05-01 ENCOUNTER — Ambulatory Visit: Payer: Medicare Other | Admitting: Rehabilitative and Restorative Service Providers"

## 2023-05-01 ENCOUNTER — Telehealth: Payer: Self-pay

## 2023-05-01 NOTE — Telephone Encounter (Signed)
-----   Message from [facility] McDiarmid sent at 04/30/2023  8:38 AM EDT ----- Please let Ms Beed know she needs to have her thyroid function checked before further refills.   This is because here thyroid check at [facility] in May was slightly abnormal.   Recommend she come into [facility] lab in next 1 to 2 weeks to have it rechecked.   In meantime, Dr McDiarmid sent in a 3 month supply of your levothyroxine at the current dose.   Thank you,  Tawanna Cooler

## 2023-05-01 NOTE — Telephone Encounter (Signed)
Spoke with patient made appt. For 9/5 at 11:00a. Molly Giles, CMA

## 2023-05-02 ENCOUNTER — Encounter: Payer: Self-pay | Admitting: Rehabilitative and Restorative Service Providers"

## 2023-05-02 ENCOUNTER — Ambulatory Visit: Payer: Medicare Other | Admitting: Rehabilitative and Restorative Service Providers"

## 2023-05-02 DIAGNOSIS — M542 Cervicalgia: Secondary | ICD-10-CM

## 2023-05-02 DIAGNOSIS — R42 Dizziness and giddiness: Secondary | ICD-10-CM

## 2023-05-02 DIAGNOSIS — H8112 Benign paroxysmal vertigo, left ear: Secondary | ICD-10-CM

## 2023-05-02 NOTE — H&P (Cosign Needed)
Molly Giles is an 67 y.o. female.   Chief Complaint: Right forefoot pain HPI: Patient is a 67 yo female that presents to the OR today, after failing conservative treatment, for her chronic Right forefoot pain.  She will require right 2nd MT weil osteotomy; collateral ligament repair; possible hammertoe correction; 2nd webspace Morton's neuroma excision.  Allergies:  Allergies  Allergen Reactions   Sulfa Antibiotics Itching   Chlorhexidine Gluconate Other (See Comments) and Rash    Burning   Codeine Nausea Only    "head nausea"    Levofloxacin Other (See Comments)    Pain in arm and behind ankles. Pain in arm and behind ankles.    Lyrica [Pregabalin] Other (See Comments)    Dizziness, nausea   Methylprednisolone     Flushing of the face   Prednisone     Flushing of the face   Betadine [Povidone Iodine] Rash    burning   Latex Itching and Rash    burning   Wellbutrin [Bupropion] Anxiety    Past Medical History:  Diagnosis Date   Abdominal bloating 05/10/2021   Abdominal discomfort, generalized 05/07/2016   Acute gastritis 11/19/2020   Allergic rhinoconjunctivitis 07/02/2015   Anal fissure 11/19/2020   Anterior to posterior tear of superior glenoid labrum of left shoulder 02/22/2021   Arthritis    Benign positional vertigo 04/2015   Responded well to [facility]   Bruxism (teeth grinding)    Chronic contact dermatitis 08/06/2018   Per allergist Dr Edwyna Ready.    Chronic migraine 02/25/2016   Per Dr Lucia Gaskins review in notes from [facility] from September 2015. Showed total headache days last month 18. Severe headache days 7 days. Moderate headache days 5 days. Mild headache days last month sick days. Days without headache last month 10 days. Symptoms associated with photophobia, phonophobia, osmophobia, neck pain, dizziness, jaw pain, nasal congestion, vision disturbances, tingling and numbness, weakness and worsening with activity. Each  headache attack last 3 hours depending on treatment in severity. Left side, the right side, easier side, the frontal area in the back of the head. Characterized as throbbing, pressure, tightness, squeezing, stabbing and burning    Chronic migraine without aura 05/15/2013    Dr Lucia Gaskins [facility]   Chronic tonsillitis 01/27/2019   DDD (degenerative disc disease), cervical 11/18/2018   DDD (degenerative disc disease), lumbar    Depression    Disc displacement, lumbar    Dysphagia 10/11/2021   Dysrhythmia    seen by dr Donnie Aho- not a problem since she has been on Bystolic   Episodic cluster headache, not intractable 03/06/2017   Essential hypertension 05/15/2013   Family history of adverse reaction to anesthesia    Brother- N/V   Family history of premature CAD 05/15/2013   Fibromyalgia syndrome 07/01/2015   Management by Dr Kathie Rhodes. Devashwar (Rheum)    GERD (gastroesophageal reflux disease) 12/16/2014   H/O seasonal allergies    Hallux rigidus of right foot 09/25/2022   Hammer toe    Left great toe   Hashimoto's thyroiditis    Per patient, diagnosed by Dr. Ferdinand Cava   Hearing loss of both ears 07/27/2015   mild to borderline moderate low frequency hearing loss improving to within normal limits bilaterally on audiology testing at [facility] in November 2016.     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   Hyperlipidemia 1998   Hypokalemia due to excessive gastrointestinal loss of potassium 07/28/2019   Hypothyroidism    Impairment of balance 02/2015   Consequent of postconcussive syndrome   Injury of triangular fibrocartilage complex of left wrist 02/25/2019   Dx  02/25/19 Bradly Bienenstock IV MD ([facility])   Insulin resistance 07/04/2017   Internal hemorrhoid 01/28/2021   Internal hemorrhoid seen on colonoscopy 10/2020 Roderic Scarce MD [facility])   Interstitial cystitis    Irritable bowel syndrome with diarrhea 04/03/2016   Left ventricular hypertrophy, mild 02/25/2016   ECHOcardiogram report 06/17/15 showing EF55-60%, mild LVH and G1DD    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HCC) 03/10/2018   [facility]   Lumbar facet joint pain    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   Mood disorder (HCC)    Morbid obesity (HCC) 03/06/2017   Musculoskeletal neck pain 07/14/2015   Nocturnal hypoxemia 03/06/2017   Normal coronary arteries 05/14/2014   Obesity (BMI 30.0-34.9) 01/29/2017   Odynophagia 12/20/2021   Osteoarthritis of left hip 11/01/2015   MRI order by Dr Charlann Boxer (ortho) 10/2015 showed significant arthritis of left hip joint with cystic changes in femoral head c/w osteoarthritis   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, lumbar region 10/30/2011   Other insomnia 11/09/2016   Overweight    Pain in joint of left shoulder 11/09/2016   Pain in joint, multiple sites 11/18/2018   Palpitations 05/15/2013   [facility] manages   Perianal candidiasis 12/20/2021   Periodic limb movement sleep disorder 03/28/2017   Perirectal cyst 05/07/2016   Poison ivy dermatitis 03/24/2021   PONV (postoperative nausea and vomiting)    Positive ANA (antinuclear antibody) 11/18/2018   Post concussion syndrome 06/06/2015   Post concussive syndrome 07/14/2015   Ms Ury's post-concussive syndrome manifesting in vertigo and headache, mood changes, poor balance, dizziness, and decreased concentration per Dr Lucia Gaskins at [facility].    Posterior vitreous detachment of right eye 2014   Premature menopause 12/20/2021   Rectal abscess 05/10/2021   S/P left THA, AA 07/25/2016   Sacroiliac inflammation (HCC) 08/18/2021   Shingles    Shortness of breath  dyspnea    with exertion   Sicca syndrome (HCC) 11/18/2018   (+) ANA   Sleep walking and eating 03/06/2017   Snoring 03/06/2017   Spondylosis of lumbar region without myelopathy or radiculopathy 10/30/2011   TFC (triangular fibrocartilage complex) injury 02/25/2019   Thyroid nodule 08/11/2009   Findings: The thyroid gland is within normal limits in size.  The gland is diffusely inhomogeneous. A small solid nodule is noted in the lower pole  medially on the right of 7 x 6 x 8 mm. A small solid nodule is noted inferiorly on the left of 3 x 3 x 4 mm.  IMPRESSION:  The thyroid gland is within normal limits in size with only small solid nodules present, the largest of only 8 mm in diameter on the right.     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    Trochanteric bursitis, right hip 04/26/2020   Tubular adenoma of colon 01/28/2021   Colonoscopy screening 4 mm tubular Adenoma polyp Charlott Rakes, MD [facility])   Tubular adenoma of colon 01/28/2021   Colonoscopy screening 4 mm  tubular Adenoma polyp Charlott Rakes, MD [facility])   Vasomotor symptoms due to menopause 04/19/2017   Vitamin D deficiency 05/08/2017   Vulvitis 07/22/2020   Yeast vaginitis 07/11/2019    Past Surgical History:  Procedure Laterality Date   arthroscopy Left 01/2022   with SAD and DCR, K. Supple MD   Bladder dilitation     x 3   BREAST BIOPSY Right 2011   Benign histology   CARDIOVASCULAR STRESS TEST  2000   Unremarkable per pt report   CARPOMETACARPAL JOINT ARTHROTOMY Right 2011   COLONOSCOPY     COLONOSCOPY WITH PROPOFOL N/A 04/21/2015   Procedure: COLONOSCOPY WITH PROPOFOL;  Surgeon: Jeani Hawking, MD;  Location: [facility] ENDOSCOPY;  Service: Endoscopy;  Laterality: N/A;   CYSTOSCOPY W/ DILATION OF BLADDER N/A    EPIDURAL BLOCK INJECTION Left 04/12/2016   Left Medial Nerve Block and Left L5 ramus block, Dr Sheran Luz    EPIDURAL BLOCK INJECTION   03/21/2016   Left L3-4 medial branch block and Left L5 & dorsal ramus block    EPIDURAL BLOCK INJECTION N/A 10/25/2016   Sheran Luz, MD. Lumbar medial branch block   EPIDURAL BLOCK INJECTION N/A 02/09/2017   Sheran Luz, MD.  Bilateral L3/4 medial branch block, bilateral L5 dorsal ramus block   EPIDURAL BLOCK INJECTION N/A 07/04/2017   Sheran Luz, MD   FECAL TRANSPLANT  04/21/2015   Procedure: FECAL TRANSPLANT;  Surgeon: Jeani Hawking, MD;  Location: [facility] ENDOSCOPY;  Service: Endoscopy;;   HIP ARTHROPLASTY Left    HIP ARTHROSCOPY Left 03/06/2018   Left hip arthroplasty, redo for loose hip arthroplasty. Procedure at [facility] hospital   INJECTION HIP INTRA ARTICULAR Left 11/2015   for OA by Dr Vilma Prader IMPLANT PLACEMENT  04/2021   Floyde Parkins MD ([facility])   INTERSTIM IMPLANT REVISION N/A 09/2021   Floyde Parkins MD ([facility])   OTHER SURGICAL HISTORY Left 2016   Left L3/L4 medial nerve block and Left L5 Dorsal Ramus block Dr Rosine Abe   TOTAL HIP ARTHROPLASTY Left 07/25/2016   Procedure: LEFT TOTAL HIP ARTHROPLASTY ANTERIOR APPROACH;  Surgeon: Durene Romans, MD;  Location: [facility];  Service: Orthopedics;  Laterality: Left;    Family History: Family History  Problem Relation Age of Onset   Alzheimer's disease Mother    Hyperlipidemia Mother    Hypertension Mother    Osteoporosis Mother    Parkinson's disease Mother    Migraines Sister    Allergies Sister    Hypertension Sister    Hyperlipidemia Brother    Cardiomyopathy Brother    Diabetes type II Brother    Kidney disease Brother    Asthma Brother    Heart disease Brother    Hypertension Brother    Hyperlipidemia Brother    Kidney disease Brother    Heart disease Brother    Heart disease Father    Hyperlipidemia Father    Hypertension Father    Aortic aneurysm Father    Early death Father 62   Diabetes type II Other    Breast cancer Maternal Aunt     Social History:    reports that she has never smoked. She has been exposed to tobacco smoke. She has never used smokeless tobacco. She reports that she does not drink alcohol and does not use drugs.  Medications: No medications prior to admission.    No results found for this or any previous visit (from the past 48  hour(s)).  No results found.    Height 5\' 6"  (1.676 m), weight 80.7 kg.  PE:  well nourished and well developed.  NAD.  EOMI.  Resp unlabored.  Beginnings of Right 2nd hammertoe.  Tender to palpation over R 2nd MTP joint.  Assessment/Plan Right foot metatarsalgia   Patients presents for elective right 2nd MT weil osteotomy; collateral ligament repair; possible hammertoe correction; 2nd webspace Morton's neuroma excision. After reviewing the procedure, post-op protocol and risks of surgery the elects for surgical intervention. The patient specifically understands risks of bleeding, infection, nerve damage, blood clots, need for additional surgery, continued pain, nonunion, post traumatic arthritis, recurrence of deformity, amputation and death.   Alfredo Martinez PA-C [facility] Office:  [phone]   No changes from note above.  To OR today.  The risks and benefits of the alternative treatment options have been discussed in detail.  The patient wishes to proceed with surgery and specifically understands risks of bleeding, infection, nerve damage, blood clots, need for additional surgery, amputation and death.

## 2023-05-02 NOTE — Therapy (Signed)
OUTPATIENT PHYSICAL THERAPY VESTIBULAR EVALUATION     Patient Name: Molly Giles MRN: [id number] DOB:October 19, 1955, 67 y.o., female Today's Date: 05/02/2023  END OF SESSION:  PT End of Session - 05/02/23 1109     Visit Number 2    Number of Visits 8    Date for PT Re-Evaluation 06/23/23    Authorization Type Medicare A and B    Progress Note Due on Visit 10    PT Start Time 1107    PT Stop Time 1145    PT Time Calculation (min) 38 min    Activity Tolerance Patient tolerated treatment well    Behavior During Therapy [facility] for tasks assessed/performed             Past Medical History:  Diagnosis Date   Abdominal bloating 05/10/2021   Abdominal discomfort, generalized 05/07/2016   Acute gastritis 11/19/2020   Allergic rhinoconjunctivitis 07/02/2015   Anal fissure 11/19/2020   Anterior to posterior tear of superior glenoid labrum of left shoulder 02/22/2021   Arthritis    Benign positional vertigo 04/2015   Responded well to Vestibular Rehab   Bruxism (teeth grinding)    Chronic contact dermatitis 08/06/2018   Per allergist Dr Edwyna Ready.    Chronic migraine 02/25/2016   Per Dr Lucia Gaskins review in notes from [facility] from September 2015. Showed total headache days last month 18. Severe headache days 7 days. Moderate headache days 5 days. Mild headache days last month sick days. Days without headache last month 10 days. Symptoms associated with photophobia, phonophobia, osmophobia, neck pain, dizziness, jaw pain, nasal congestion, vision disturbances, tingling and numbness, weakness and worsening with activity. Each headache attack last 3 hours depending on treatment in severity. Left side, the right side, easier side, the frontal area in the back of the head. Characterized as throbbing, pressure, tightness, squeezing, stabbing and burning    Chronic migraine without aura 05/15/2013    Dr Lucia Gaskins [facility]   Chronic tonsillitis  01/27/2019   DDD (degenerative disc disease), cervical 11/18/2018   DDD (degenerative disc disease), lumbar    Depression    Disc displacement, lumbar    Dysphagia 10/11/2021   Dysrhythmia    seen by dr Donnie Aho- not a problem since she has been on Bystolic   Episodic cluster headache, not intractable 03/06/2017   Essential hypertension 05/15/2013   Family history of adverse reaction to anesthesia    Brother- N/V   Family history of premature CAD 05/15/2013   Fibromyalgia syndrome 07/01/2015   Management by Dr Kathie Rhodes. Devashwar (Rheum)    GERD (gastroesophageal reflux disease) 12/16/2014   H/O seasonal allergies    Hallux rigidus of right foot 09/25/2022   Hammer toe    Left great toe   Hashimoto's thyroiditis    Per patient, diagnosed by Dr. Ferdinand Cava   Hearing loss of both ears 07/27/2015   mild to borderline moderate low frequency hearing loss improving to within normal limits bilaterally on audiology testing at [facility] in November 2016.     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   Hyperlipidemia 1998   Hypokalemia due to excessive gastrointestinal loss of potassium 07/28/2019   Hypothyroidism  Impairment of balance 02/2015   Consequent of postconcussive syndrome   Injury of triangular fibrocartilage complex of left wrist 02/25/2019   Dx 02/25/19 Bradly Bienenstock IV MD ([facility])   Insulin resistance 07/04/2017   Internal hemorrhoid 01/28/2021   Internal hemorrhoid seen on colonoscopy 10/2020 Roderic Scarce MD [facility])   Interstitial cystitis    Irritable bowel syndrome with diarrhea 04/03/2016   Left ventricular hypertrophy, mild 02/25/2016   ECHOcardiogram report  06/17/15 showing EF55-60%, mild LVH and G1DD    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HCC) 03/10/2018   [facility]   Lumbar facet joint pain    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   Mood disorder (HCC)    Morbid obesity (HCC) 03/06/2017   Musculoskeletal neck pain 07/14/2015   Nocturnal hypoxemia 03/06/2017   Normal coronary arteries 05/14/2014   Obesity (BMI 30.0-34.9) 01/29/2017   Odynophagia 12/20/2021   Osteoarthritis of left hip 11/01/2015   MRI order by Dr Charlann Boxer (ortho) 10/2015 showed significant arthritis of left hip joint with cystic changes in femoral head c/w osteoarthritis   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, lumbar region 10/30/2011   Other insomnia 11/09/2016   Overweight    Pain in joint of left shoulder 11/09/2016   Pain in joint, multiple sites 11/18/2018   Palpitations 05/15/2013    Cardiology manages   Perianal candidiasis 12/20/2021   Periodic limb movement sleep disorder 03/28/2017   Perirectal cyst 05/07/2016   Poison ivy dermatitis 03/24/2021   PONV (postoperative nausea and vomiting)    Positive ANA (antinuclear antibody) 11/18/2018   Post concussion syndrome 06/06/2015   Post concussive syndrome 07/14/2015   Ms Jemmott's post-concussive syndrome manifesting in vertigo and headache, mood changes, poor balance, dizziness, and decreased concentration per Dr Lucia Gaskins at [facility].    Posterior vitreous detachment of right eye 2014   Premature menopause 12/20/2021   Rectal abscess 05/10/2021   S/P left THA, AA 07/25/2016   Sacroiliac inflammation (HCC) 08/18/2021   Shingles    Shortness of breath dyspnea    with exertion   Sicca syndrome (HCC) 11/18/2018   (+) ANA   Sleep walking and eating 03/06/2017   Snoring 03/06/2017   Spondylosis of lumbar region without myelopathy or radiculopathy 10/30/2011   TFC (triangular fibrocartilage complex) injury 02/25/2019   Thyroid nodule 08/11/2009   Findings: The thyroid gland is  within normal limits in size.  The gland is diffusely inhomogeneous. A small solid nodule is noted in the lower pole  medially on the right of 7 x 6 x 8 mm. A small solid nodule is noted inferiorly on the left of 3 x 3 x 4 mm.  IMPRESSION:  The thyroid gland is within normal limits in size with only small solid nodules present, the largest of only 8 mm in diameter on the right.     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    Trochanteric bursitis, right hip 04/26/2020   Tubular adenoma of colon 01/28/2021   Colonoscopy screening 4 mm tubular Adenoma polyp Charlott Rakes, MD [facility])   Tubular adenoma of colon 01/28/2021   Colonoscopy screening 4 mm tubular Adenoma polyp Charlott Rakes, MD [facility])   Vasomotor symptoms due to menopause 04/19/2017   Vitamin D deficiency 05/08/2017   Vulvitis 07/22/2020   Yeast vaginitis 07/11/2019   Past Surgical History:  Procedure Laterality Date   arthroscopy Left 01/2022   with SAD and DCR, K.  Supple MD   Bladder dilitation     x 3   BREAST BIOPSY Right 2011   Benign histology   CARDIOVASCULAR STRESS TEST  2000   Unremarkable per pt report   CARPOMETACARPAL JOINT ARTHROTOMY Right 2011   COLONOSCOPY     COLONOSCOPY WITH PROPOFOL N/A 04/21/2015   Procedure: COLONOSCOPY WITH PROPOFOL;  Surgeon: Jeani Hawking, MD;  Location: [facility] ENDOSCOPY;  Service: Endoscopy;  Laterality: N/A;   CYSTOSCOPY W/ DILATION OF BLADDER N/A    EPIDURAL BLOCK INJECTION Left 04/12/2016   Left Medial Nerve Block and Left L5 ramus block, Dr Sheran Luz    EPIDURAL BLOCK INJECTION  03/21/2016   Left L3-4 medial branch block and Left L5 & dorsal ramus block    EPIDURAL BLOCK INJECTION N/A 10/25/2016   Sheran Luz, MD. Lumbar medial branch block   EPIDURAL BLOCK INJECTION N/A 02/09/2017   Sheran Luz, MD.  Bilateral L3/4 medial branch block, bilateral L5 dorsal ramus block   EPIDURAL BLOCK INJECTION N/A  07/04/2017   Sheran Luz, MD   FECAL TRANSPLANT  04/21/2015   Procedure: FECAL TRANSPLANT;  Surgeon: Jeani Hawking, MD;  Location: [facility] ENDOSCOPY;  Service: Endoscopy;;   HIP ARTHROPLASTY Left    HIP ARTHROSCOPY Left 03/06/2018   Left hip arthroplasty, redo for loose hip arthroplasty. Procedure at [facility] hospital   INJECTION HIP INTRA ARTICULAR Left 11/2015   for OA by Dr Vilma Prader IMPLANT PLACEMENT  04/2021   Floyde Parkins MD ([facility])   INTERSTIM IMPLANT REVISION N/A 09/2021   Floyde Parkins MD ([facility])   OTHER SURGICAL HISTORY Left 2016   Left L3/L4 medial nerve block and Left L5 Dorsal Ramus block Dr Rosine Abe   TOTAL HIP ARTHROPLASTY Left 07/25/2016   Procedure: LEFT TOTAL HIP ARTHROPLASTY ANTERIOR APPROACH;  Surgeon: Durene Romans, MD;  Location: [facility];  Service: Orthopedics;  Laterality: Left;   Patient Active Problem List   Diagnosis Date Noted   Vertigo 03/19/2023   Shingles 03/19/2023   Polyphagia 03/07/2023   Rash 02/07/2023   BMI 29.0-29.9,adult Current BMI 29.9 02/07/2023   Low serum vitamin B12 01/10/2023   OSA (obstructive sleep apnea) 12/27/2022   BMI 30.0-30.9,adult 12/27/2022   Severe obstructive sleep apnea-hypopnea syndrome 12/22/2022   Hyperglycemia 12/11/2022   Depression screening 12/11/2022   Generalized obesity 12/11/2022   SOBOE (shortness of breath on exertion) 12/11/2022   Other fatigue 12/11/2022   Sleep related headaches 11/07/2022   Intractable episodic cluster headache 11/07/2022   Sleep walking and eating 11/07/2022   Ambien use disorder, 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   Psychophysiological insomnia 11/07/2022   Rhinitis 11/03/2022   Hallux rigidus of right foot 09/25/2022   Morton neuroma, right 09/25/2022   Iliopsoas bursitis of left hip 05/08/2022   Chronic pain of right knee 05/08/2022   Arthrosis of hand 12/20/2021   History of Clostridioides difficile colitis, required fecal transplantation 12/20/2021    Mixed hyperlipidemia 12/20/2021   Meibomian gland dysfunction (MGD) of both eyes 10/17/2021   Nuclear sclerosis of both eyes 10/17/2021   Epiretinal membrane (ERM) of left eye 10/17/2021   OAB (overactive bladder) 03/02/2021   Osteoarthritis of acromioclavicular joint 02/22/2021   Hyperhidrosis, scalp, 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   Chronic low back pain 05/14/2019   Cervical spondylosis 11/18/2018   DDD (degenerative disc disease), lumbar 11/18/2018   Keratoconjunctivitis sicca (HCC) 11/18/2018   Chronic contact dermatitis 08/06/2018   Primary osteoarthritis of left knee 06/20/2018   Loose  total hip arthroplasty (HCC) 11/29/2017   Insulin resistance 07/04/2017   Vitamin D deficiency 05/08/2017   Periodic limb movement sleep disorder 03/28/2017   Obesity (BMI 30.0-34.9) 01/29/2017   Other insomnia 11/09/2016   Irritable bowel syndrome with diarrhea 04/03/2016   Left ventricular hypertrophy, mild 02/25/2016   Mood disorder (HCC) 07/02/2015   Allergic rhinoconjunctivitis 07/02/2015   Fibromyalgia syndrome 07/01/2015   Personal history of colonic polyps 07/01/2015   GERD (gastroesophageal reflux disease) 12/16/2014   Hypothyroidism 05/15/2013   Pure hypercholesterolemia 05/15/2013   Chronic interstitial cystitis 05/15/2013   Chronic migraine without aura 05/15/2013    PCP: Tawanna Cooler McDiarmid, MD REFERRING PROVIDER: Caro Laroche, DO   REFERRING DIAG: R42 (ICD-10-CM) - Vertigo   THERAPY DIAG:  Dizziness and giddiness  BPPV (benign paroxysmal positional vertigo), left  Cervicalgia  ONSET DATE: 03/19/23  Rationale for Evaluation and Treatment: Rehabilitation  SUBJECTIVE:   SUBJECTIVE STATEMENT: Dizziness resolved-- continued discomfort in the neck and headaches remain.   EVAL:The patient had a sudden onset of dizziness in July when at [facility]. She was changing in a dressing room and stood up quickly with room spinning. The initial phase of vertigo lasted x days and she had to  cancel a trip. She also hurt her neck and shoulder during that time. She notes dizziness with rolling to the left that lasts for a few seconds.  Pt accompanied by: self  PERTINENT HISTORY: Migraines  PAIN:  Are you having pain? Hip and toe pain-- she just finished with PT.  We will not address unless it limits   PRECAUTIONS: None  WEIGHT BEARING RESTRICTIONS: No  FALLS: Has patient fallen in last 6 months? No  LIVING ENVIRONMENT: Lives with: lives with their spouse Lives in: House/apartment  PLOF: Independent  PATIENT GOALS: reduce dizziness  OBJECTIVE:  Cervical ROM:   Active A/PROM (deg) eval  Flexion WFLs  Extension WFLs  Right lateral flexion WFLs  Left lateral flexion WFLs  Right rotation WFLs  Left rotation WFLs  (Blank rows = not tested)  PATIENT SURVEYS:  Was not captured at eval for FOTO  VESTIBULAR ASSESSMENT: (Measures in this section from initial evaluation unless otherwise noted) GENERAL OBSERVATION: The patient ambulates into clinic independently.    SYMPTOM BEHAVIOR:  Subjective history: Sudden onset of dizziness, has h/o dizziness x years  Non-Vestibular symptoms: migraine symptoms  Type of dizziness: Spinning/Vertigo  Frequency: daily  Duration: seconds at this time (was minutes at onset)  Aggravating factors:  rolling to the left  Relieving factors: head stationary  Progression of symptoms: better  OCULOMOTOR EXAM:  Ocular Alignment: normal  Ocular ROM: No Limitations  Spontaneous Nystagmus: absent  Gaze-Induced Nystagmus: absent  Smooth Pursuits: intact  Saccades: intact   VESTIBULAR - OCULAR REFLEX:   Slow VOR: Normal  Head-Impulse Test: HIT Right: negative HIT Left: negative  POSITIONAL TESTING: Right Dix-Hallpike: no nystagmus and sensation of fogginess worsens Left Dix-Hallpike: no nystagmus and return to sitting provokes an unsteadiness and patient needs to rest Right Roll Test: no nystagmus Left Roll Test: note a trace of  upbeat, rotary nystagmus  [facility] Adult PT Treatment:                                                DATE: 05/02/23 Therapeutic Exercise: Prone Scap retraction and depression Sidelying  Open book Seated AROM sidebending neck  Levator SCM stretch Manual Therapy: STM bilat suboccipitals, bilat upper trap Trigger Point Dry-Needling  Treatment instructions: Expect mild to moderate muscle soreness. S/S of pneumothorax if dry needled over a lung field, and to seek immediate medical attention should they occur. Patient verbalized understanding of these instructions and education. Patient Consent Given: Yes Education handout provided: Yes Muscles treated: bilat upper trap Treatment response/outcome:  muscle twitch and palpable lengthening, has continued "foggy" feeling in head, no HA after DN-- notes overall improvement Neuromuscular re-ed: Positional testing L sidelying= feels internal dizziness, no room spinning-- also feels dizzy with return to sitting R sidelying=no symptoms, no nystagmus L dix hallpike=sensation of dizziness with no nystagmus viewed in room light R dix hallpike=none Bilat horizontal roll= none Habituation Austin Miles for HEP   [facility] Adult PT Treatment:                                                DATE: 04/24/23  Canalith Repositioning:  Epley Left: Number of Reps: 2 and Comment: symptoms improved second rep  PATIENT EDUCATION: Education details: nature of condition, plan for treatment Person educated: Patient Education method: Explanation Education comprehension: verbalized understanding  HOME EXERCISE PROGRAM: Access Code: [email or web address] URL: [email or web address] Date: 05/02/2023 Prepared by: Margretta Ditty  Exercises - Brandt-Daroff Vestibular Exercise  - 2 x daily - 7 x weekly - 1 sets - 5 reps - Prone Scapular Slide with Shoulder Extension  - 1 x daily - 7 x weekly - 1 sets - 10 reps - Sidelying Thoracic Rotation with Open Book  - 1 x daily  - 7 x weekly - 1 sets - 10 reps - Sternocleidomastoid Stretch  - 1 x daily - 7 x weekly - 1 sets - 3 reps - 30 seconds hold - Seated Levator Scapulae Stretch  - 1 x daily - 7 x weekly - 1 sets - 3 reps - 30 seconds hold - Seated Cervical Sidebending Stretch  - 1 x daily - 7 x weekly - 1 sets - 3 reps - 30 seconds hold  GOALS: Goals reviewed with patient? Yes  SHORT TERM GOALS: Target date: 05/24/23  The patient will be indep with habituation HEP. Baseline: to provide HEP Goal status: INITIAL   LONG TERM GOALS: Target date: 06/23/23  The patient will report bed mobility without episodes of spinning. Baseline:  Spinning with L roll Goal status: INITIAL  2.  The patient will have negative positional testing. Baseline:  + L horizontal roll and noted nystagmus when entering into 1st position with epley's Goal status: INITIAL  3.  The patient will verbalize understanding of self mgmt of symptoms.  Baseline:  to review home treatment for future mgmt Goal status: INITIAL   ASSESSMENT: CLINICAL IMPRESSION: The patient has improvement since last session. She has continued neck pain and HA and responded well today to DN.  EVAL: Patient is a 67 y.o. female who was seen today for physical therapy evaluation and treatment for vertigo. She is known to me from prior treatment for BPPV and vestibular issues. She presents today with + L BPPV initially noted with rotary nystagmus with L horizontal roll. She responded well to L epley's x 2 reps today. She also does get imbalance and HA at times-- PT to monitor and adjust treatment as needed.    OBJECTIVE IMPAIRMENTS: decreased balance and dizziness.  PLAN:  PT FREQUENCY: 1x/week  PT DURATION: 8 weeks  PLANNED INTERVENTIONS: Therapeutic exercises, Therapeutic activity, Neuromuscular re-education, Balance training, Gait training, Patient/Family education, Self Care, Joint mobilization, Vestibular training, Canalith repositioning, Dry Needling,  and Manual therapy  PLAN FOR NEXT SESSION: recheck BPPV and treat, initiate HEP for habituation. Folllow up on neck and HA mgmt.   Alek Poncedeleon, PT 05/02/2023, 1:24 PM

## 2023-05-02 NOTE — Anesthesia Preprocedure Evaluation (Addendum)
Anesthesia Evaluation  Patient identified by MRN, date of birth, ID band Patient awake    Reviewed: Allergy & Precautions, NPO status , Patient's Chart, lab work & pertinent test results  History of Anesthesia Complications (+) PONV and history of anesthetic complications  Airway Mallampati: III  TM Distance: >3 FB Neck ROM: Full    Dental  (+) Dental Advisory Given   Pulmonary neg shortness of breath, sleep apnea and Continuous Positive Airway Pressure Ventilation , neg COPD, neg recent URI   Pulmonary exam normal breath sounds clear to auscultation       Cardiovascular hypertension, (-) angina (-) Past MI, (-) Cardiac Stents and (-) CABG + dysrhythmias (palpitations) + Valvular Problems/Murmurs (mild MR/TR)  Rhythm:Regular Rate:Normal  HLD  Normal ETT 11/04/2019   Neuro/Psych  Headaches, neg Seizures PSYCHIATRIC DISORDERS  Depression    BPPV, Meniere's disease  Neuromuscular disease (lumbar spondylosis)    GI/Hepatic Neg liver ROS,GERD  ,,  Endo/Other  neg diabetesHypothyroidism  Thyroid nodule  Renal/GU negative Renal ROS Bladder dysfunction      Musculoskeletal  (+) Arthritis ,  Fibromyalgia -  Abdominal   Peds  Hematology negative hematology ROS (+)   Anesthesia Other Findings Sicca syndrome  Last Zepbound: has not started it yet  Reproductive/Obstetrics                             Anesthesia Physical Anesthesia Plan  ASA: 3  Anesthesia Plan: General   Post-op Pain Management: Tylenol PO (pre-op)*   Induction: Intravenous  PONV Risk Score and Plan: 4 or greater and Ondansetron, Dexamethasone, Propofol infusion, TIVA, Midazolam and Treatment may vary due to age or medical condition  Airway Management Planned: LMA  Additional Equipment:   Intra-op Plan:   Post-operative Plan: Extubation in OR  Informed Consent: I have reviewed the patients History and Physical, chart,  labs and discussed the procedure including the risks, benefits and alternatives for the proposed anesthesia with the patient or authorized representative who has indicated his/her understanding and acceptance.     Dental advisory given  Plan Discussed with: CRNA and Anesthesiologist  Anesthesia Plan Comments: (Risks of general anesthesia discussed including, but not limited to, sore throat, hoarse voice, chipped/damaged teeth, injury to vocal cords, nausea and vomiting, allergic reactions, lung infection, heart attack, stroke, and death. All questions answered. )       Anesthesia Quick Evaluation

## 2023-05-03 ENCOUNTER — Ambulatory Visit (HOSPITAL_BASED_OUTPATIENT_CLINIC_OR_DEPARTMENT_OTHER): Payer: Medicare Other | Admitting: Anesthesiology

## 2023-05-03 ENCOUNTER — Other Ambulatory Visit: Payer: Self-pay

## 2023-05-03 ENCOUNTER — Encounter (HOSPITAL_BASED_OUTPATIENT_CLINIC_OR_DEPARTMENT_OTHER): Payer: Self-pay | Admitting: Orthopedic Surgery

## 2023-05-03 ENCOUNTER — Ambulatory Visit (HOSPITAL_BASED_OUTPATIENT_CLINIC_OR_DEPARTMENT_OTHER): Payer: Medicare Other

## 2023-05-03 ENCOUNTER — Ambulatory Visit (HOSPITAL_BASED_OUTPATIENT_CLINIC_OR_DEPARTMENT_OTHER): Payer: Self-pay | Admitting: Anesthesiology

## 2023-05-03 ENCOUNTER — Encounter (HOSPITAL_BASED_OUTPATIENT_CLINIC_OR_DEPARTMENT_OTHER)
Admission: RE | Disposition: A | Discharge status: Home / Self Care | Payer: Self-pay | Source: Home / Self Care | Attending: Orthopedic Surgery

## 2023-05-03 ENCOUNTER — Ambulatory Visit (HOSPITAL_BASED_OUTPATIENT_CLINIC_OR_DEPARTMENT_OTHER)
Admission: RE | Admit: 2023-05-03 | Discharge: 2023-05-03 | Disposition: A | Discharge status: Home / Self Care | Payer: Medicare Other | Attending: Orthopedic Surgery | Admitting: Orthopedic Surgery

## 2023-05-03 DIAGNOSIS — M797 Fibromyalgia: Secondary | ICD-10-CM | POA: Insufficient documentation

## 2023-05-03 DIAGNOSIS — K219 Gastro-esophageal reflux disease without esophagitis: Secondary | ICD-10-CM | POA: Diagnosis not present

## 2023-05-03 DIAGNOSIS — I10 Essential (primary) hypertension: Secondary | ICD-10-CM | POA: Insufficient documentation

## 2023-05-03 DIAGNOSIS — M1612 Unilateral primary osteoarthritis, left hip: Secondary | ICD-10-CM | POA: Diagnosis not present

## 2023-05-03 DIAGNOSIS — M7741 Metatarsalgia, right foot: Secondary | ICD-10-CM

## 2023-05-03 DIAGNOSIS — Z6829 Body mass index (BMI) 29.0-29.9, adult: Secondary | ICD-10-CM | POA: Diagnosis not present

## 2023-05-03 DIAGNOSIS — G5761 Lesion of plantar nerve, right lower limb: Secondary | ICD-10-CM

## 2023-05-03 DIAGNOSIS — Z01818 Encounter for other preprocedural examination: Secondary | ICD-10-CM

## 2023-05-03 DIAGNOSIS — M25374 Other instability, right foot: Secondary | ICD-10-CM | POA: Diagnosis not present

## 2023-05-03 DIAGNOSIS — G4733 Obstructive sleep apnea (adult) (pediatric): Secondary | ICD-10-CM | POA: Diagnosis not present

## 2023-05-03 HISTORY — PX: LIGAMENT REPAIR: SHX5444

## 2023-05-03 HISTORY — PX: EXCISION MORTON'S NEUROMA: SHX5013

## 2023-05-03 HISTORY — PX: WEIL OSTEOTOMY: SHX5044

## 2023-05-03 SURGERY — OSTEOTOMY, WEIL
Anesthesia: General | Site: Foot | Laterality: Right

## 2023-05-03 MED ORDER — CEFAZOLIN SODIUM-DEXTROSE 2-4 GM/100ML-% IV SOLN
2.0000 g | INTRAVENOUS | Status: AC
  Administered 2023-05-03: 2 g via INTRAVENOUS

## 2023-05-03 MED ORDER — ONDANSETRON HCL 4 MG/2ML IJ SOLN
INTRAMUSCULAR | Status: AC
  Filled 2023-05-03: qty 2

## 2023-05-03 MED ORDER — BUPIVACAINE-EPINEPHRINE (PF) 0.5% -1:200000 IJ SOLN
INTRAMUSCULAR | Status: AC
  Filled 2023-05-03: qty 30

## 2023-05-03 MED ORDER — MIDAZOLAM HCL 2 MG/2ML IJ SOLN
INTRAMUSCULAR | Status: AC
  Filled 2023-05-03: qty 2

## 2023-05-03 MED ORDER — LIDOCAINE 2% (20 MG/ML) 5 ML SYRINGE
INTRAMUSCULAR | Status: AC
  Filled 2023-05-03: qty 5

## 2023-05-03 MED ORDER — FENTANYL CITRATE (PF) 100 MCG/2ML IJ SOLN
INTRAMUSCULAR | Status: AC
  Filled 2023-05-03: qty 2

## 2023-05-03 MED ORDER — EPHEDRINE SULFATE (PRESSORS) 50 MG/ML IJ SOLN
INTRAMUSCULAR | Status: DC | PRN
  Administered 2023-05-03 (×2): 10 mg via INTRAVENOUS

## 2023-05-03 MED ORDER — DEXAMETHASONE SODIUM PHOSPHATE 10 MG/ML IJ SOLN
INTRAMUSCULAR | Status: AC
  Filled 2023-05-03: qty 1

## 2023-05-03 MED ORDER — ACETAMINOPHEN 500 MG PO TABS
1000.0000 mg | ORAL_TABLET | Freq: Once | ORAL | Status: AC
  Administered 2023-05-03: 1000 mg via ORAL

## 2023-05-03 MED ORDER — SODIUM CHLORIDE 0.9 % IV SOLN: INTRAVENOUS | Status: DC

## 2023-05-03 MED ORDER — DEXAMETHASONE SODIUM PHOSPHATE 10 MG/ML IJ SOLN
INTRAMUSCULAR | Status: DC | PRN
  Administered 2023-05-03: 5 mg via INTRAVENOUS

## 2023-05-03 MED ORDER — PROPOFOL 10 MG/ML IV BOLUS
INTRAVENOUS | Status: AC
  Filled 2023-05-03: qty 20

## 2023-05-03 MED ORDER — PHENYLEPHRINE 80 MCG/ML (10ML) SYRINGE FOR IV PUSH (FOR BLOOD PRESSURE SUPPORT)
PREFILLED_SYRINGE | INTRAVENOUS | Status: AC
  Filled 2023-05-03: qty 10

## 2023-05-03 MED ORDER — SENNA 8.6 MG PO TABS: 2.0000 | ORAL_TABLET | Freq: Two times a day (BID) | ORAL | 0 refills | Status: DC

## 2023-05-03 MED ORDER — VANCOMYCIN HCL 500 MG IV SOLR
INTRAVENOUS | Status: AC
  Filled 2023-05-03: qty 10

## 2023-05-03 MED ORDER — 0.9 % SODIUM CHLORIDE (POUR BTL) OPTIME
TOPICAL | Status: DC | PRN
  Administered 2023-05-03: 200 mL

## 2023-05-03 MED ORDER — AMISULPRIDE (ANTIEMETIC) 5 MG/2ML IV SOLN: 10.0000 mg | Freq: Once | INTRAVENOUS | Status: DC | PRN

## 2023-05-03 MED ORDER — ONDANSETRON HCL 4 MG/2ML IJ SOLN
INTRAMUSCULAR | Status: DC | PRN
  Administered 2023-05-03: 4 mg via INTRAVENOUS

## 2023-05-03 MED ORDER — LIDOCAINE 2% (20 MG/ML) 5 ML SYRINGE
INTRAMUSCULAR | Status: DC | PRN
Start: 2023-05-03 — End: 2023-05-03
  Administered 2023-05-03: 100 mg via INTRAVENOUS

## 2023-05-03 MED ORDER — OXYCODONE HCL 5 MG/5ML PO SOLN: 5.0000 mg | Freq: Once | ORAL | Status: AC | PRN

## 2023-05-03 MED ORDER — PHENYLEPHRINE HCL (PRESSORS) 10 MG/ML IV SOLN
INTRAVENOUS | Status: DC | PRN
  Administered 2023-05-03: 200 ug via INTRAVENOUS
  Administered 2023-05-03: 120 ug via INTRAVENOUS

## 2023-05-03 MED ORDER — VANCOMYCIN HCL 500 MG IV SOLR
INTRAVENOUS | Status: DC | PRN
  Administered 2023-05-03: 500 mg via TOPICAL

## 2023-05-03 MED ORDER — FENTANYL CITRATE (PF) 100 MCG/2ML IJ SOLN
INTRAMUSCULAR | Status: DC | PRN
  Administered 2023-05-03 (×2): 25 ug via INTRAVENOUS
  Administered 2023-05-03: 50 ug via INTRAVENOUS

## 2023-05-03 MED ORDER — OXYCODONE HCL 5 MG PO TABS
ORAL_TABLET | ORAL | Status: AC
  Filled 2023-05-03: qty 1

## 2023-05-03 MED ORDER — BUPIVACAINE-EPINEPHRINE (PF) 0.5% -1:200000 IJ SOLN
INTRAMUSCULAR | Status: DC | PRN
  Administered 2023-05-03: 10 mL

## 2023-05-03 MED ORDER — OXYCODONE HCL 5 MG PO TABS
5.0000 mg | ORAL_TABLET | Freq: Once | ORAL | Status: AC | PRN
  Administered 2023-05-03: 5 mg via ORAL

## 2023-05-03 MED ORDER — DOCUSATE SODIUM 100 MG PO CAPS: 100.0000 mg | ORAL_CAPSULE | Freq: Two times a day (BID) | ORAL | 0 refills | Status: DC

## 2023-05-03 MED ORDER — OXYCODONE HCL 5 MG PO TABS: 5.0000 mg | ORAL_TABLET | Freq: Four times a day (QID) | ORAL | 0 refills | Status: AC | PRN

## 2023-05-03 MED ORDER — ACETAMINOPHEN 500 MG PO TABS
ORAL_TABLET | ORAL | Status: AC
  Filled 2023-05-03: qty 2

## 2023-05-03 MED ORDER — MIDAZOLAM HCL 5 MG/5ML IJ SOLN
INTRAMUSCULAR | Status: DC | PRN
  Administered 2023-05-03: 2 mg via INTRAVENOUS

## 2023-05-03 MED ORDER — LACTATED RINGERS IV SOLN: INTRAVENOUS | Status: DC

## 2023-05-03 MED ORDER — FENTANYL CITRATE (PF) 100 MCG/2ML IJ SOLN: 25.0000 ug | INTRAMUSCULAR | Status: DC | PRN

## 2023-05-03 MED ORDER — PROPOFOL 10 MG/ML IV BOLUS
INTRAVENOUS | Status: DC | PRN
  Administered 2023-05-03: 150 mg via INTRAVENOUS
  Administered 2023-05-03: 150 ug/kg/min via INTRAVENOUS

## 2023-05-03 SURGICAL SUPPLY — 86 items
APL PRP STRL LF DISP 70% ISPRP (MISCELLANEOUS) ×1
BANDAGE ESMARK 6X9 LF (GAUZE/BANDAGES/DRESSINGS) IMPLANT
BLADE AVERAGE 25X9 (BLADE) IMPLANT
BLADE LONG MED 25X9 (BLADE) ×1 IMPLANT
BLADE SURG 15 STRL LF DISP TIS (BLADE) ×2 IMPLANT
BLADE SURG 15 STRL SS (BLADE) ×2
BNDG CMPR 5X4 CHSV STRCH STRL (GAUZE/BANDAGES/DRESSINGS)
BNDG CMPR 5X4 KNIT ELC UNQ LF (GAUZE/BANDAGES/DRESSINGS) ×1
BNDG CMPR 6 X 5 YARDS HK CLSR (GAUZE/BANDAGES/DRESSINGS)
BNDG CMPR 75X21 PLY HI ABS (MISCELLANEOUS)
BNDG CMPR 9X4 STRL LF SNTH (GAUZE/BANDAGES/DRESSINGS)
BNDG CMPR 9X6 STRL LF SNTH (GAUZE/BANDAGES/DRESSINGS)
BNDG COHESIVE 4X5 TAN STRL LF (GAUZE/BANDAGES/DRESSINGS) IMPLANT
BNDG ELASTIC 4INX 5YD STR LF (GAUZE/BANDAGES/DRESSINGS) ×1 IMPLANT
BNDG ELASTIC 6INX 5YD STR LF (GAUZE/BANDAGES/DRESSINGS) IMPLANT
BNDG ESMARK 4X9 LF (GAUZE/BANDAGES/DRESSINGS) IMPLANT
BNDG ESMARK 6X9 LF (GAUZE/BANDAGES/DRESSINGS)
BNDG GZE 12X3 1 PLY HI ABS (GAUZE/BANDAGES/DRESSINGS) ×1
BNDG STRETCH GAUZE 3IN X12FT (GAUZE/BANDAGES/DRESSINGS) ×1 IMPLANT
CANISTER SUCT 1200ML W/VALVE (MISCELLANEOUS) ×1 IMPLANT
CHLORAPREP W/TINT 26 (MISCELLANEOUS) ×1 IMPLANT
CORD BIPOLAR FORCEPS 12FT (ELECTRODE) ×1 IMPLANT
COVER BACK TABLE 60X90IN (DRAPES) ×1 IMPLANT
CUFF TOURN SGL QUICK 18X4 (TOURNIQUET CUFF) IMPLANT
CUFF TOURN SGL QUICK 24 (TOURNIQUET CUFF) ×1
CUFF TOURN SGL QUICK 34 (TOURNIQUET CUFF)
CUFF TRNQT CYL 24X4X16.5-23 (TOURNIQUET CUFF) IMPLANT
CUFF TRNQT CYL 34X4.125X (TOURNIQUET CUFF) IMPLANT
DRAPE EXTREMITY T 121X128X90 (DISPOSABLE) ×1 IMPLANT
DRAPE OEC MINIVIEW 54X84 (DRAPES) ×1 IMPLANT
DRAPE U-SHAPE 47X51 STRL (DRAPES) ×1 IMPLANT
DRSG MEPITEL 4X7.2 (GAUZE/BANDAGES/DRESSINGS) ×1 IMPLANT
ELECT REM PT RETURN 9FT ADLT (ELECTROSURGICAL) ×1
ELECTRODE REM PT RTRN 9FT ADLT (ELECTROSURGICAL) ×1 IMPLANT
GAUZE PAD ABD 8X10 STRL (GAUZE/BANDAGES/DRESSINGS) ×1 IMPLANT
GAUZE SPONGE 4X4 12PLY STRL (GAUZE/BANDAGES/DRESSINGS) ×1 IMPLANT
GAUZE STRETCH 2X75IN STRL (MISCELLANEOUS) IMPLANT
GLOVE BIOGEL PI IND STRL 8 (GLOVE) ×2 IMPLANT
GLOVE SURG SYN 8.0 (GLOVE) ×2 IMPLANT
GLOVE SURG SYN 8.0 PF PI (GLOVE) ×2 IMPLANT
GOWN STRL REUS W/ TWL LRG LVL3 (GOWN DISPOSABLE) ×1 IMPLANT
GOWN STRL REUS W/ TWL XL LVL3 (GOWN DISPOSABLE) ×2 IMPLANT
GOWN STRL REUS W/TWL LRG LVL3 (GOWN DISPOSABLE) ×1
GOWN STRL REUS W/TWL XL LVL3 (GOWN DISPOSABLE) ×2
K-WIRE DBL .062X4 NSTRL (WIRE)
KWIRE DBL .062X4 NSTRL (WIRE) IMPLANT
NDL HYPO 22X1.5 SAFETY MO (MISCELLANEOUS) IMPLANT
NDL HYPO 25X1 1.5 SAFETY (NEEDLE) IMPLANT
NDL SUT 6 .5 CRC .975X.05 MAYO (NEEDLE) IMPLANT
NEEDLE HYPO 22X1.5 SAFETY MO (MISCELLANEOUS) IMPLANT
NEEDLE HYPO 25X1 1.5 SAFETY (NEEDLE) ×1 IMPLANT
NEEDLE MAYO TAPER (NEEDLE)
NS IRRIG 1000ML POUR BTL (IV SOLUTION) ×1 IMPLANT
PACK BASIN DAY SURGERY FS (CUSTOM PROCEDURE TRAY) ×1 IMPLANT
PAD CAST 4YDX4 CTTN HI CHSV (CAST SUPPLIES) ×1 IMPLANT
PADDING CAST ABS COTTON 4X4 ST (CAST SUPPLIES) IMPLANT
PADDING CAST COTTON 4X4 STRL (CAST SUPPLIES) ×1
PADDING CAST COTTON 6X4 STRL (CAST SUPPLIES) IMPLANT
PASSER SUT SWANSON 36MM LOOP (INSTRUMENTS) IMPLANT
PENCIL SMOKE EVACUATOR (MISCELLANEOUS) ×1 IMPLANT
RETRIEVER SUT HEWSON (MISCELLANEOUS) IMPLANT
SANITIZER HAND PURELL FF 515ML (MISCELLANEOUS) ×1 IMPLANT
SCREW HCS TWIST-OFF 2.0X12MM (Screw) IMPLANT
SHEET MEDIUM DRAPE 40X70 STRL (DRAPES) ×1 IMPLANT
SLEEVE SCD COMPRESS KNEE MED (STOCKING) ×1 IMPLANT
SPIKE FLUID TRANSFER (MISCELLANEOUS) IMPLANT
SPLINT PLASTER CAST FAST 5X30 (CAST SUPPLIES) IMPLANT
SPONGE T-LAP 18X18 ~~LOC~~+RFID (SPONGE) ×1 IMPLANT
STOCKINETTE 6 STRL (DRAPES) ×1 IMPLANT
SUCTION TUBE FRAZIER 10FR DISP (SUCTIONS) ×1 IMPLANT
SUT ETHIBOND 0 MO6 C/R (SUTURE) IMPLANT
SUT ETHIBOND 2 OS 4 DA (SUTURE) IMPLANT
SUT ETHILON 3 0 PS 1 (SUTURE) ×1 IMPLANT
SUT FIBERWIRE 2-0 18 17.9 3/8 (SUTURE)
SUT MNCRL AB 3-0 PS2 18 (SUTURE) ×1 IMPLANT
SUT VIC AB 2-0 SH 27 (SUTURE)
SUT VIC AB 2-0 SH 27XBRD (SUTURE) IMPLANT
SUT VICRYL 0 SH 27 (SUTURE) IMPLANT
SUT VICRYL 0 UR6 27IN ABS (SUTURE) IMPLANT
SUTURE FIBERWR 2-0 18 17.9 3/8 (SUTURE) IMPLANT
SYR BULB EAR ULCER 3OZ GRN STR (SYRINGE) ×1 IMPLANT
SYR CONTROL 10ML LL (SYRINGE) IMPLANT
TOWEL GREEN STERILE FF (TOWEL DISPOSABLE) ×2 IMPLANT
TUBE CONNECTING 20X1/4 (TUBING) ×1 IMPLANT
UNDERPAD 30X36 HEAVY ABSORB (UNDERPADS AND DIAPERS) ×1 IMPLANT
YANKAUER SUCT BULB TIP NO VENT (SUCTIONS) IMPLANT

## 2023-05-03 NOTE — Discharge Instructions (Addendum)
Next dose of Tylenol at 12:30 today if needed. Next dose of Oxycodone may be given at 3pm if needed.  Post Anesthesia Home Care Instructions  Activity: Get plenty of rest for the remainder of the day. A responsible individual must stay with you for 24 hours following the procedure.  For the next 24 hours, DO NOT: -Drive a car -Advertising copywriter -Drink alcoholic beverages -Take any medication unless instructed by your physician -Make any legal decisions or sign important papers.  Meals: Start with liquid foods such as gelatin or soup. Progress to regular foods as tolerated. Avoid greasy, spicy, heavy foods. If nausea and/or vomiting occur, drink only clear liquids until the nausea and/or vomiting subsides. Call your physician if vomiting continues.  Special Instructions/Symptoms: Your throat may feel dry or sore from the anesthesia or the breathing tube placed in your throat during surgery. If this causes discomfort, gargle with warm salt water. The discomfort should disappear within 24 hours.  If you had a scopolamine patch placed behind your ear for the management of post- operative nausea and/or vomiting:  1. The medication in the patch is effective for 72 hours, after which it should be removed.  Wrap patch in a tissue and discard in the trash. Wash hands thoroughly with soap and water. 2. You may remove the patch earlier than 72 hours if you experience unpleasant side effects which may include dry mouth, dizziness or visual disturbances. 3. Avoid touching the patch. Wash your hands with soap and water after contact with the patch.     Toni Arthurs, MD [facility]  Please read the following information regarding your care after surgery.  Medications  You only need a prescription for the narcotic pain medicine (ex. oxycodone, Percocet, Norco).  All of the other medicines listed below are available over the counter. ? Aleve 2 pills twice a day for the first 3 days after  surgery. ? acetominophen (Tylenol) 650 mg every 4-6 hours as you need for minor to moderate pain ? oxycodone as prescribed for severe pain  Narcotic pain medicine (ex. oxycodone, Percocet, Vicodin) will cause constipation.  To prevent this problem, take the following medicines while you are taking any pain medicine. ? docusate sodium (Colace) 100 mg twice a day ? senna (Senokot) 2 tablets twice a day   Weight Bearing ? Bear weight only on your operated foot in the post-op shoe.   Cast / Splint / Dressing ? Keep your splint, cast or dressing clean and dry.  Don't put anything (coat hanger, pencil, etc) down inside of it.  If it gets damp, use a hair dryer on the cool setting to dry it.  If it gets soaked, call the office to schedule an appointment for a cast change.    After your dressing, cast or splint is removed; you may shower, but do not soak or scrub the wound.  Allow the water to run over it, and then gently pat it dry.  Swelling It is normal for you to have swelling where you had surgery.  To reduce swelling and pain, keep your toes above your nose for at least 3 days after surgery.  It may be necessary to keep your foot or leg elevated for several weeks.  If it hurts, it should be elevated.  Follow Up Call my office at [phone] when you are discharged from the hospital or surgery center to schedule an appointment to be seen two weeks after surgery.  Call my office at [phone] if you  develop a fever >101.5 F, nausea, vomiting, bleeding from the surgical site or severe pain.

## 2023-05-03 NOTE — Progress Notes (Signed)
Patient called from home to say she has bleeding from her surgical site (near toes). She has been riding in car so foot has been low. Encouraged her to lie down and elevate leg/foot above ("toes above the nose"), remove surgical shoe and place ice pack. Encouraged her to call MD office if bleeding doesn't subside. Will notify Dr Victorino Dike (who is currently in OR case) when he comes through preop before next case.

## 2023-05-03 NOTE — Anesthesia Postprocedure Evaluation (Signed)
Anesthesia Post Note  Patient: Chanci Cope Metallo  Procedure(s) Performed: RIGHT 2ND WEIL OSTEOTOMY (Right: Foot) COLLATERAL LIGAMENT REPAIR (Right: Foot) EXCISION MORTON'S NEUROMA (Right: Foot)     Patient location during evaluation: PACU Anesthesia Type: General Level of consciousness: awake Pain management: pain level controlled Vital Signs Assessment: post-procedure vital signs reviewed and stable Respiratory status: spontaneous breathing, nonlabored ventilation and respiratory function stable Cardiovascular status: blood pressure returned to baseline and stable Postop Assessment: no apparent nausea or vomiting Anesthetic complications: no   No notable events documented.  Last Vitals:  Vitals:   05/03/23 0845 05/03/23 0908  BP: 119/72 124/86  Pulse: 87 91  Resp: (!) 9 14  Temp:  36.4 C  SpO2: 98% 95%    Last Pain:  Vitals:   05/03/23 0857  TempSrc:   PainSc: 3                  Linton Rump

## 2023-05-03 NOTE — Transfer of Care (Signed)
Immediate Anesthesia Transfer of Care Note  Patient: Molly Giles  Procedure(s) Performed: RIGHT 2ND WEIL OSTEOTOMY, POSSIBLE HAMMERTOE CORRECTION (Right) COLLATERAL LIGAMENT REPAIR (Right) EXCISION MORTON'S NEUROMA (Right)  Patient Location: PACU  Anesthesia Type:General  Level of Consciousness: awake, alert , and oriented  Airway & Oxygen Therapy: Patient Spontanous Breathing and Patient connected to nasal cannula oxygen  Post-op Assessment: Report given to RN and Post -op Vital signs reviewed and stable  Post vital signs: Reviewed and stable  Last Vitals:  Vitals Value Taken Time  BP 117/69 05/03/23 0815  Temp    Pulse 115 05/03/23 0816  Resp 11 05/03/23 0816  SpO2 97 % 05/03/23 0816  Vitals shown include unfiled device data.  Last Pain:  Vitals:   05/03/23 0629  TempSrc: Tympanic  PainSc: 4       Patients Stated Pain Goal: 5 (05/03/23 0629)  Complications: No notable events documented.

## 2023-05-03 NOTE — Anesthesia Procedure Notes (Signed)
Procedure Name: LMA Insertion Date/Time: 05/03/2023 7:35 AM  Performed by: Elliot Dally, CRNAPre-anesthesia Checklist: Patient identified, Emergency Drugs available, Suction available and Patient being monitored Patient Re-evaluated:Patient Re-evaluated prior to induction Oxygen Delivery Method: Circle System Utilized Preoxygenation: Pre-oxygenation with 100% oxygen Induction Type: IV induction Ventilation: Mask ventilation without difficulty LMA: LMA inserted LMA Size: 4.0 Number of attempts: 1 Airway Equipment and Method: Bite block Placement Confirmation: positive ETCO2 Tube secured with: Tape Dental Injury: Teeth and Oropharynx as per pre-operative assessment

## 2023-05-03 NOTE — Op Note (Signed)
05/03/2023  8:07 AM  PATIENT:  Molly Giles  67 y.o. female  PRE-OPERATIVE DIAGNOSIS: 1.  Right forefoot metatarsalgia 2.  Right second MTP joint instability 3.  Right second webspace Morton's neuroma  POST-OPERATIVE DIAGNOSIS: Same  Procedure(s): 1.  Right second webspace Morton's neuroma excision 2.  Right second metatarsal Weil osteotomy 3.  Right second MTP joint lateral collateral ligament repair 4.  Right foot AP, lateral and oblique radiographs  SURGEON:  Toni Arthurs, MD  ASSISTANT: Alfredo Martinez, PA-C  ANESTHESIA:   General, local  EBL:  minimal   TOURNIQUET:   Total Tourniquet Time Documented: Calf (Right) - 19 minutes Total: Calf (Right) - 19 minutes  COMPLICATIONS:  None apparent  DISPOSITION:  Extubated, awake and stable to recovery.  INDICATION FOR PROCEDURE: 67 year old female without significant past medical history has a long history of right forefoot worsening pain.  She has signs and symptoms of right metatarsalgia, second MTP joint instability and second webspace Morton's neuroma.  She has failed nonoperative treatment including activity modification, oral anti-inflammatories and shoewear modification.  She presents today for surgical treatment of these painful right forefoot conditions.  The risks and benefits of the alternative treatment options have been discussed in detail.  The patient wishes to proceed with surgery and specifically understands risks of bleeding, infection, nerve damage, blood clots, need for additional surgery, amputation and death.   PROCEDURE IN DETAIL:  After pre operative consent was obtained, and the correct operative site was identified, the patient was brought to the operating room and placed supine on the OR table.  Anesthesia was administered.  Pre-operative antibiotics were administered.  A surgical timeout was taken.  The right lower extremity was prepped and draped in standard sterile fashion with a tourniquet around the  calf.  The extremity was elevated, and the tourniquet was inflated to 200 mmHg.  A longitudinal incision was made over the second webspace.  Dissection was carried sharply down through the subcutaneous tissues.  The interval between the second and third metatarsal heads was developed.  The intermetatarsal ligament was divided under direct vision.  The interdigital nerve was identified.  It was noted to be thickened and fibrotic.  It was traced proximally to the level of the interossei and transected.  It was traced distally past the bifurcation and transected before removing it completely.  Attention was turned to the second MTP joint.  The extensor tendons were protected.  The dorsal joint capsule was incised and elevated medially and laterally.  The second metatarsal head was noted to have some degenerative change.  An osteotomy was made with the oscillating saw in the fashion of a Weil.  A second osteotomy was made to remove the exposed degenerated metatarsal head.  The plantar cortical surface was rotated dorsally and the head was allowed to retract a few millimeters proximally.  The osteotomy was fixed with a 2 mm [employer] FRS screw.  The overhanging bone was trimmed with a rondure.  The plantar plate was roughened with a small curette.  A 0 Vicryl box suture was placed in the lateral collateral ligament.  The toe was reduced and the suture tied securely.  Final AP, oblique and lateral radiographs showed appropriate shortening of the second metatarsal and appropriate position and length of the screw.  The wound was irrigated copiously and sprinkled with vancomycin powder.  The skin incision was closed with horizontal mattress sutures of 3-0 nylon.  Sterile dressings were applied followed by a compression wrap with  a bolster between the hallux and second toe.  The tourniquet was released after application of the dressings.  The patient was awakened from anesthesia and transported to the recovery room in  stable condition.  FOLLOW UP PLAN: Weightbearing as tolerated in a flat postop shoe.  Follow-up in the office in 2 weeks for suture removal and a toe spacer.  Plan 4 to 6 weeks postoperative nonweightbearing immobilization.  Toe spacer for 6 weeks postop.  No indication for DVT prophylaxis in this ambulatory patient.   RADIOGRAPHS: AP, oblique and lateral radiographs of the right foot are obtained intraoperatively.  These show interval shortening of the second metatarsal.  Hardware is appropriately positioned and of the appropriate length.  No other acute injuries are noted.    Alfredo Martinez PA-C was present and scrubbed for the duration of the operative case. His assistance was essential in positioning the patient, prepping and draping, gaining and maintaining exposure, performing the operation, closing and dressing the wounds and applying the splint.

## 2023-05-04 ENCOUNTER — Encounter (HOSPITAL_BASED_OUTPATIENT_CLINIC_OR_DEPARTMENT_OTHER): Payer: Self-pay | Admitting: Orthopedic Surgery

## 2023-05-10 ENCOUNTER — Encounter (INDEPENDENT_AMBULATORY_CARE_PROVIDER_SITE_OTHER): Payer: Self-pay | Admitting: Family Medicine

## 2023-05-10 ENCOUNTER — Ambulatory Visit (INDEPENDENT_AMBULATORY_CARE_PROVIDER_SITE_OTHER): Payer: Medicare Other | Admitting: Family Medicine

## 2023-05-10 VITALS — BP 110/85 | HR 97 | Temp 97.7°F | Ht 66.0 in | Wt 177.0 lb

## 2023-05-10 DIAGNOSIS — Z6828 Body mass index (BMI) 28.0-28.9, adult: Secondary | ICD-10-CM

## 2023-05-10 DIAGNOSIS — E559 Vitamin D deficiency, unspecified: Secondary | ICD-10-CM

## 2023-05-10 DIAGNOSIS — E669 Obesity, unspecified: Secondary | ICD-10-CM | POA: Diagnosis not present

## 2023-05-10 MED ORDER — VITAMIN D (ERGOCALCIFEROL) 1.25 MG (50000 UNIT) PO CAPS
50000.0000 [IU] | ORAL_CAPSULE | ORAL | 0 refills | Status: DC
Start: 2023-05-10 — End: 2023-07-10

## 2023-05-15 NOTE — Progress Notes (Signed)
Chief Complaint:   OBESITY Molly Giles is here to discuss her progress with her obesity treatment plan along with follow-up of her obesity related diagnoses. Molly Giles is on the Category 1 Plan and states she is following her eating plan approximately 90% of the time. Nayari states she is doing 0 minutes 0 times per week.  Today's visit was #: 9 Starting weight: 199 lbs Starting date: 12/11/2022 Today's weight: 199 lbs Today's date: 05/10/2023 Total lbs lost to date: 22 Total lbs lost since last in-office visit: 1  Interim History: Patient continues to lose weight.  She is working on increasing her protein and not skipping meals.  Subjective:   1. Vitamin D deficiency Patient's vitamin D level is not yet at goal.  She requests a refill today.  Assessment/Plan:   1. Vitamin D deficiency Patient will continue prescription vitamin D 50,000 IU once weekly, and we will refill for 1 month.  We will recheck labs in 2 months.  - Vitamin D, Ergocalciferol, (DRISDOL) 1.25 MG (50000 UNIT) CAPS capsule; Take 1 capsule (50,000 Units total) by mouth every 7 (seven) days.  Dispense: 5 capsule; Refill: 0  2. BMI 28.0-28.9,adult  3. Obesity, Beginning BMI 32.1 Jazalyn is currently in the action stage of change. As such, her goal is to continue with weight loss efforts. She has agreed to the Category 1 Plan.  Patient was shown how to use chat GPT to meal plan including meeting macros and how to create grocery lists.   Behavioral modification strategies: increasing lean protein intake and meal planning and cooking strategies.  Harriette has agreed to follow-up with our clinic in 4 weeks. She was informed of the importance of frequent follow-up visits to maximize her success with intensive lifestyle modifications for her multiple health conditions.   Objective:   Blood pressure 110/85, pulse 97, temperature 97.7 F (36.5 C), height 5\' 6"  (1.676 m), weight 177 lb (80.3 kg), SpO2 96%. Body mass index is 28.57  kg/m.  Lab Results  Component Value Date   CREATININE 0.88 12/11/2022   BUN 16 12/11/2022   NA 138 12/11/2022   K 4.0 12/11/2022   CL 103 12/11/2022   CO2 19 (L) 12/11/2022   Lab Results  Component Value Date   ALT 20 12/11/2022   AST 19 12/11/2022   ALKPHOS 81 12/11/2022   BILITOT 0.3 12/11/2022   Lab Results  Component Value Date   HGBA1C 5.3 12/11/2022   HGBA1C 5.2 06/26/2019   HGBA1C 5.0 02/07/2018   HGBA1C 5.2 07/04/2017   HGBA1C 5.3 03/22/2017   Lab Results  Component Value Date   INSULIN 7.8 12/11/2022   INSULIN 8.3 05/07/2018   INSULIN 11.8 07/04/2017   INSULIN 10.5 03/22/2017   Lab Results  Component Value Date   TSH 4.960 (H) 12/11/2022   Lab Results  Component Value Date   CHOL 176 12/11/2022   HDL 54 12/11/2022   LDLCALC 101 (H) 12/11/2022   TRIG 117 12/11/2022   CHOLHDL 3.3 10/11/2021   Lab Results  Component Value Date   VD25OH 41.2 12/11/2022   VD25OH 43.3 05/07/2018   VD25OH 48.5 07/04/2017   Lab Results  Component Value Date   WBC 6.4 08/04/2020   HGB 13.5 08/04/2020   HCT 41 08/04/2020   MCV 95.0 07/05/2019   PLT 262 08/04/2020   No results found for: "IRON", "TIBC", "FERRITIN"  Attestation Statements:   Reviewed by clinician on day of visit: allergies, medications, problem list, medical history,  surgical history, family history, social history, and previous encounter notes.  Time spent on visit including pre-visit chart review and post-visit care and charting was 30 minutes.   I, Burt Knack, am acting as transcriptionist for Quillian Quince, MD.  I have reviewed the above documentation for accuracy and completeness, and I agree with the above. -  Quillian Quince, MD

## 2023-05-16 ENCOUNTER — Telehealth (INDEPENDENT_AMBULATORY_CARE_PROVIDER_SITE_OTHER): Payer: Self-pay | Admitting: Family Medicine

## 2023-05-16 NOTE — Telephone Encounter (Signed)
Patient called stating that she has not been able to obtain the Zepbound injection Dr. Dalbert Garnet prescribed. Pt would like Dr. Dalbert Garnet to prescribe the Zepbound vials instead. Please call patient at the number on file. Thank You!

## 2023-05-17 ENCOUNTER — Ambulatory Visit (INDEPENDENT_AMBULATORY_CARE_PROVIDER_SITE_OTHER): Payer: Medicare Other | Admitting: Family Medicine

## 2023-05-17 ENCOUNTER — Encounter: Payer: Self-pay | Admitting: Family Medicine

## 2023-05-17 VITALS — BP 112/86 | HR 87 | Ht 66.0 in | Wt 181.8 lb

## 2023-05-17 DIAGNOSIS — E038 Other specified hypothyroidism: Secondary | ICD-10-CM | POA: Diagnosis not present

## 2023-05-17 DIAGNOSIS — Z23 Encounter for immunization: Secondary | ICD-10-CM

## 2023-05-17 DIAGNOSIS — E039 Hypothyroidism, unspecified: Secondary | ICD-10-CM | POA: Diagnosis present

## 2023-05-17 NOTE — Patient Instructions (Signed)
  Congratulation on the weight loss!  We are checking your TSH today.  If it is abnormal, Dr Mersadez Linden will call you. If it is normal, then he will send you a message through MyChart and refill your levothyroxine at the current daily dose of 62.5 mg.

## 2023-05-17 NOTE — Telephone Encounter (Signed)
Patient called stating that she has not been able to obtain the Zepbound injection Dr. Dalbert Garnet prescribed. Pt would like Dr. Dalbert Garnet to prescribe the Zepbound vials instead. Please call patient at the number on file. Thank You!

## 2023-05-18 ENCOUNTER — Encounter: Payer: Self-pay | Admitting: Family Medicine

## 2023-05-18 LAB — TSH: TSH: 1.97 u[IU]/mL (ref 0.450–4.500)

## 2023-05-18 MED ORDER — LEVOTHYROXINE SODIUM 125 MCG PO TABS
62.5000 ug | ORAL_TABLET | Freq: Every day | ORAL | 3 refills | Status: DC
Start: 2023-05-18 — End: 2024-07-08

## 2023-05-18 NOTE — Addendum Note (Signed)
Addended byPerley Jain, Searcy Miyoshi D on: 05/18/2023 10:24 AM   Modules accepted: Orders

## 2023-05-18 NOTE — Progress Notes (Signed)
Molly Giles is alone Sources of clinical information for visit is/are patient. Nursing assessment for this office visit was reviewed with the patient for accuracy and revision.     Previous Report(s) Reviewed: none     05/17/2023   11:03 AM  Depression screen PHQ 2/9  Decreased Interest 0  Down, Depressed, Hopeless 0  PHQ - 2 Score 0  Altered sleeping 3  Tired, decreased energy 1  Change in appetite 0  Feeling bad or failure about yourself  1  Trouble concentrating 2  Moving slowly or fidgety/restless 0  Suicidal thoughts 0  PHQ-9 Score 7  Difficult doing work/chores Somewhat difficult   Flowsheet Row Office Visit from 05/17/2023 in [facility] Office Visit from 04/10/2022 in [facility] Office Visit from 02/02/2022 in [facility]  Thoughts that you would be better off dead, or of hurting yourself in some way Not at all Not at all Not at all  PHQ-9 Total Score 7 10 9           05/17/2023   11:04 AM 03/19/2023   11:01 AM 12/07/2022    2:15 PM 04/10/2022    9:59 AM 02/02/2022    9:48 AM  Fall Risk   Falls in the past year? 0 0 0 0 0  Number falls in past yr: 0 0 0 0 0  Injury with Fall? 0 0 0 0 0  Risk for fall due to :   No Fall Risks    Follow up   Falls evaluation completed Falls evaluation completed        05/17/2023   11:03 AM 12/07/2022    2:14 PM 04/10/2022    9:58 AM  PHQ9 SCORE ONLY  PHQ-9 Total Score 7 0 10    There are no preventive care reminders to display for this patient.  Health Maintenance Due  Topic Date Due   COVID-19 Vaccine (5 - 2023-24 season) 05/13/2023      History/P.E. limitations: none  There are no preventive care reminders to display for this patient. There are no preventive care reminders to display for this patient.  Health Maintenance Due  Topic Date Due   COVID-19 Vaccine (5 - 2023-24 season) 05/13/2023     Chief Complaint  Patient presents with   Thyroid  function     --------------------------------------------------------------------------------------------------------------------------------------------- Visit Problem List with A/P  No problem-specific Assessment & Plan notes found for this encounter.

## 2023-05-18 NOTE — Assessment & Plan Note (Signed)
Established problem Controlled - patient at goal TSH Molly Giles had ten monotiring TSH levels since 2016, but a slightly high at 4.960 on 12/11/22 at [facility] initial labs.  Lab Results  Component Value Date   TSH 1.970 05/17/2023   Continue current levothyroxine 125 microgram tablet, 1/2 tablet (62.5 mg) daily in morning before meals or other oral meds.  Year refills sent to patient's pharmacy.

## 2023-05-21 NOTE — Telephone Encounter (Signed)
I spoke with the patient to r/s her SS due to the tech being out.  She is r/s for 05/22/23 at 8 pm.

## 2023-05-22 ENCOUNTER — Encounter (INDEPENDENT_AMBULATORY_CARE_PROVIDER_SITE_OTHER): Payer: Self-pay

## 2023-05-22 ENCOUNTER — Ambulatory Visit (INDEPENDENT_AMBULATORY_CARE_PROVIDER_SITE_OTHER): Payer: Medicare Other | Admitting: Neurology

## 2023-05-22 DIAGNOSIS — G4709 Other insomnia: Secondary | ICD-10-CM

## 2023-05-22 DIAGNOSIS — G4733 Obstructive sleep apnea (adult) (pediatric): Secondary | ICD-10-CM | POA: Diagnosis not present

## 2023-05-22 DIAGNOSIS — M47812 Spondylosis without myelopathy or radiculopathy, cervical region: Secondary | ICD-10-CM

## 2023-05-22 DIAGNOSIS — G44011 Episodic cluster headache, intractable: Secondary | ICD-10-CM

## 2023-05-22 NOTE — Telephone Encounter (Signed)
Advise the patient we can do that but we need a visit to go over the differences in dosing and how to get the vials of zepbound as the rules are a bit different with this version.

## 2023-05-24 ENCOUNTER — Ambulatory Visit (INDEPENDENT_AMBULATORY_CARE_PROVIDER_SITE_OTHER): Payer: Medicare Other | Admitting: Family Medicine

## 2023-05-24 ENCOUNTER — Ambulatory Visit: Payer: Medicare Other | Admitting: Rehabilitative and Restorative Service Providers"

## 2023-05-24 MED ORDER — ALPRAZOLAM 1 MG PO TABS: 0.5000 mg | ORAL_TABLET | Freq: Every evening | ORAL | 0 refills | Status: DC | PRN

## 2023-05-24 NOTE — Addendum Note (Signed)
Addended by: Melvyn Novas on: 05/24/2023 01:09 PM   Modules accepted: Orders
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
## 2023-05-24 NOTE — Therapy (Deleted)
OUTPATIENT PHYSICAL THERAPY VESTIBULAR TREATMENT     Patient Name: Molly Giles MRN: [id number] DOB:12-25-1955, 67 y.o., female Today's Date: 05/24/2023  END OF SESSION:    Past Medical History:  Diagnosis Date   Abdominal bloating 05/10/2021   Abdominal discomfort, generalized 05/07/2016   Acute gastritis 11/19/2020   Allergic rhinoconjunctivitis 07/02/2015   Anal fissure 11/19/2020   Anterior to posterior tear of superior glenoid labrum of left shoulder 02/22/2021   Arthritis    Benign positional vertigo 04/2015   Responded well to V[facility]ibular Rehab   Bruxism (teeth grinding)    Chronic contact dermatitis 08/06/2018   Per allergist Dr Edwyna Ready.    Chronic migraine 02/25/2016   Per Dr Lucia Gaskins review in notes from [facility] from September 2015. Showed total headache days last month 18. Severe headache days 7 days. Moderate headache days 5 days. Mild headache days last month sick days. Days without headache last month 10 days. Symptoms associated with photophobia, phonophobia, osmophobia, neck pain, dizziness, jaw pain, nasal congestion, vision disturbances, tingling and numbness, weakness and worsening with activity. Each headache attack last 3 hours depending on treatment in severity. Left side, the right side, easier side, the frontal area in the back of the head. Characterized as throbbing, pressure, tightness, squeezing, stabbing and burning    Chronic migraine without aura 05/15/2013    [facility]   Chronic tonsillitis 01/27/2019   DDD (degenerative disc disease), cervical 11/18/2018   DDD (degenerative disc disease), lumbar    Depression    Disc displacement, lumbar    Dysphagia 10/11/2021   Dysrhythmia    seen by dr Donnie Aho- not a problem since she has been on Bystolic   Episodic cluster headache, not intractable 03/06/2017   Essential hypertension 05/15/2013   Family history of adverse reaction to anesthesia     Brother- N/V   Family history of premature CAD 05/15/2013   Fibromyalgia syndrome 07/01/2015   Management by Dr Kathie Rhodes. Devashwar (Rheum)    GERD (gastroesophageal reflux disease) 12/16/2014   H/O seasonal allergies    Hallux rigidus of right foot 09/25/2022   Hammer toe    Left great toe   Hashimoto's thyroiditis    Per patient, diagnosed by Dr. Ferdinand Cava   Hearing loss of both ears 07/27/2015   mild to borderline moderate low frequency hearing loss improving to within normal limits bilaterally on audiology testing at [facility] in November 2016.     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   Hyperlipidemia 1998   Hypokalemia due to excessive gastrointestinal loss of potassium 07/28/2019   Hypothyroidism    Iliopsoas bursitis of left hip 05/08/2022   Impairment of balance 02/2015   Consequent of postconcussive syndrome   Injury of triangular fibrocartilage complex of left wrist 02/25/2019   Dx 02/25/19 Bradly Bienenstock IV MD ([facility])   Insulin resistance 07/04/2017   Internal hemorrhoid 01/28/2021   Internal hemorrhoid seen on colonoscopy 10/2020 Roderic Scarce MD [facility])   Interstitial cystitis    Irritable bowel syndrome with diarrhea 04/03/2016   Left ventricular hypertrophy, mild 02/25/2016   ECHOcardiogram report 06/17/15 showing EF55-60%, mild LVH and G1DD    Loose  total hip arthroplasty (HCC) 03/10/2018   [facility]   Lumbar facet joint pain    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   Mood disorder (HCC)    Morbid obesity (HCC) 03/06/2017   Musculoskeletal neck pain 07/14/2015   Nocturnal hypoxemia 03/06/2017   Normal coronary arteries 05/14/2014   Obesity (BMI  30.0-34.9) 01/29/2017   Odynophagia 12/20/2021   Osteoarthritis of left hip 11/01/2015   MRI order by Dr Charlann Boxer (ortho) 10/2015 showed significant arthritis of left hip joint with cystic changes in femoral head c/w osteoarthritis   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, lumbar region 10/30/2011   Other insomnia 11/09/2016   Overweight    Pain in joint of left shoulder 11/09/2016   Pain in joint, multiple sites 11/18/2018   Palpitations 05/15/2013   [facility] manages   Perianal candidiasis 12/20/2021   Periodic limb movement sleep disorder 03/28/2017   Perirectal cyst 05/07/2016   Poison ivy dermatitis 03/24/2021   PONV (postoperative nausea and vomiting)    Positive ANA (antinuclear antibody) 11/18/2018   Post concussion syndrome 06/06/2015   Post concussive syndrome 07/14/2015   Ms Schappert's post-concussive syndrome manifesting in vertigo and headache, mood changes, poor balance, dizziness, and decreased concentration per Dr Lucia Gaskins at [facility].    Posterior vitreous detachment of right eye 2014   Premature menopause 12/20/2021   Rectal abscess 05/10/2021   S/P left THA, AA 07/25/2016   Sacroiliac inflammation (HCC) 08/18/2021   Shingles    Shortness of breath dyspnea    with exertion   Sicca syndrome (HCC) 11/18/2018   (+) ANA   Sleep walking and eating 03/06/2017   Snoring 03/06/2017   Spondylosis of lumbar region without myelopathy or radiculopathy 10/30/2011   TFC (triangular fibrocartilage complex) injury 02/25/2019   Thyroid nodule 08/11/2009   Findings: The thyroid gland is within normal limits in size.  The gland is diffusely inhomogeneous. A small solid nodule is noted in the lower pole  medially on the right of 7 x 6 x 8 mm. A small solid nodule is noted inferiorly on the left of 3 x 3 x 4 mm.  IMPRESSION:  The thyroid gland is within normal limits in size with only small solid nodules present, the largest of only 8 mm in diameter on the  right.     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    Trochanteric bursitis, right hip 04/26/2020   Tubular adenoma of colon 01/28/2021   Colonoscopy screening 4 mm tubular Adenoma polyp Charlott Rakes, MD [facility])   Tubular adenoma of colon 01/28/2021   Colonoscopy screening 4 mm tubular Adenoma polyp Charlott Rakes, MD [facility])   Vasomotor symptoms due to menopause 04/19/2017   Vitamin D deficiency 05/08/2017   Vulvitis 07/22/2020   Yeast vaginitis 07/11/2019   Past Surgical History:  Procedure Laterality Date   arthroscopy Left 01/2022   with SAD and DCR, K. Supple MD   Bladder dilitation     x 3   BREAST BIOPSY Right 2011   Benign histology   CARDIOVASCULAR STRESS TEST  2000   Unremarkable per pt report   CARPOMETACARPAL JOINT ARTHROTOMY Right 2011   COLONOSCOPY     COLONOSCOPY WITH PROPOFOL N/A 04/21/2015   Procedure: COLONOSCOPY WITH PROPOFOL;  Surgeon: Jeani Hawking, MD;  Location: [facility] ENDOSCOPY;  Service: Endoscopy;  Laterality: N/A;   CYSTOSCOPY W/ DILATION OF BLADDER N/A    EPIDURAL BLOCK  INJECTION Left 04/12/2016   Left Medial Nerve Block and Left L5 ramus block, Dr Sheran Luz    EPIDURAL BLOCK INJECTION  03/21/2016   Left L3-4 medial branch block and Left L5 & dorsal ramus block    EPIDURAL BLOCK INJECTION N/A 10/25/2016   Sheran Luz, MD. Lumbar medial branch block   EPIDURAL BLOCK INJECTION N/A 02/09/2017   Sheran Luz, MD.  Bilateral L3/4 medial branch block, bilateral L5 dorsal ramus block   EPIDURAL BLOCK INJECTION N/A 07/04/2017   Sheran Luz, MD   EXCISION MORTON'S NEUROMA Right 05/03/2023   Procedure: EXCISION MORTON'S NEUROMA;  Surgeon: Toni Arthurs, MD;  Location: [facility];  Service: Orthopedics;  Laterality: Right;  general, local by surgeon 60 MIN   FECAL TRANSPLANT  04/21/2015   Procedure: FECAL TRANSPLANT;  Surgeon: Jeani Hawking, MD;  Location: [facility] ENDOSCOPY;   Service: Endoscopy;;   HIP ARTHROPLASTY Left    HIP ARTHROSCOPY Left 03/06/2018   Left hip arthroplasty, redo for loose hip arthroplasty. Procedure at [facility] hospital   INJECTION HIP INTRA ARTICULAR Left 11/2015   for OA by Dr Vilma Prader IMPLANT PLACEMENT  04/2021   Floyde Parkins MD ([facility])   INTERSTIM IMPLANT REVISION N/A 09/2021   Floyde Parkins MD ([facility])   LIGAMENT REPAIR Right 05/03/2023   Procedure: COLLATERAL LIGAMENT REPAIR;  Surgeon: Toni Arthurs, MD;  Location: [facility];  Service: Orthopedics;  Laterality: Right;  general, local by surgeon 60 MIN   OTHER SURGICAL HISTORY Left 2016   Left L3/L4 medial nerve block and Left L5 Dorsal Ramus block Dr Rosine Abe   TOTAL HIP ARTHROPLASTY Left 07/25/2016   Procedure: LEFT TOTAL HIP ARTHROPLASTY ANTERIOR APPROACH;  Surgeon: Durene Romans, MD;  Location: [facility];  Service: Orthopedics;  Laterality: Left;   WEIL OSTEOTOMY Right 05/03/2023   Procedure: RIGHT 2ND WEIL OSTEOTOMY;  Surgeon: Toni Arthurs, MD;  Location: [facility];  Service: Orthopedics;  Laterality: Right;  general, local by surgeon 60 MIN   Patient Active Problem List   Diagnosis Date Noted   Vertigo 03/19/2023   Shingles 03/19/2023   Polyphagia 03/07/2023   Rash 02/07/2023   BMI 28.0-28.9,adult 02/07/2023   Low serum vitamin B12 01/10/2023   OSA (obstructive sleep apnea) 12/27/2022   BMI 30.0-30.9,adult 12/27/2022   Severe obstructive sleep apnea-hypopnea syndrome 12/22/2022   Hyperglycemia 12/11/2022   Depression screening 12/11/2022   Generalized obesity 12/11/2022   SOBOE (shortness of breath on exertion) 12/11/2022   Other fatigue 12/11/2022   Sleep related headaches 11/07/2022   Intractable episodic cluster headache 11/07/2022   Sleep walking and eating 11/07/2022   Ambien use disorder, 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   Psychophysiological insomnia 11/07/2022   Rhinitis 11/03/2022   Hallux  rigidus of right foot 09/25/2022   Morton neuroma, right 09/25/2022   Chronic pain of right knee 05/08/2022   Arthrosis of hand 12/20/2021   History of Clostridioides difficile colitis, required fecal transplantation 12/20/2021   Mixed hyperlipidemia 12/20/2021   Meibomian gland dysfunction (MGD) of both eyes 10/17/2021   Nuclear sclerosis of both eyes 10/17/2021   Epiretinal membrane (ERM) of left eye 10/17/2021   OAB (overactive bladder) 03/02/2021   Osteoarthritis of acromioclavicular joint 02/22/2021   Hyperhidrosis, scalp, 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   Chronic low back pain 05/14/2019   Cervical spondylosis 11/18/2018   DDD (degenerative disc disease), lumbar 11/18/2018   Keratoconjunctivitis sicca (HCC) 11/18/2018   Chronic contact dermatitis 08/06/2018  Primary osteoarthritis of left knee 06/20/2018   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HCC) 11/29/2017   Insulin resistance 07/04/2017   Vitamin D deficiency 05/08/2017   Periodic limb movement sleep disorder 03/28/2017   Obesity (BMI 30.0-34.9) 01/29/2017   Other insomnia 11/09/2016   Irritable bowel syndrome with diarrhea 04/03/2016   Left ventricular hypertrophy, mild 02/25/2016   Mood disorder (HCC) 07/02/2015   Allergic rhinoconjunctivitis 07/02/2015   Fibromyalgia syndrome 07/01/2015   Personal history of colonic polyps 07/01/2015   GERD (gastroesophageal reflux disease) 12/16/2014   Hypothyroidism 05/15/2013   Pure hypercholesterolemia 05/15/2013   Chronic interstitial cystitis 05/15/2013   Chronic migraine without aura 05/15/2013    PCP: Tawanna Cooler McDiarmid, MD REFERRING PROVIDER: Caro Laroche, DO   REFERRING DIAG: R42 (ICD-10-CM) - Vertigo   THERAPY DIAG:  No diagnosis found.  ONSET DATE: 03/19/23  Rationale for Evaluation and Treatment: Rehabilitation  SUBJECTIVE:   SUBJECTIVE STATEMENT: ***  EVAL:The patient had a sudden onset of dizziness in July when at [facility]. She was changing in a dressing room and stood up  quickly with room spinning. The initial phase of vertigo lasted x days and she had to cancel a trip. She also hurt her neck and shoulder during that time. She notes dizziness with rolling to the left that lasts for a few seconds.  Pt accompanied by: self  PERTINENT HISTORY: Migraines  PAIN:  Are you having pain? Hip and toe pain-- she just finished with PT.  We will not address unless it limits   PRECAUTIONS: None  WEIGHT BEARING RESTRICTIONS: No  FALLS: Has patient fallen in last 6 months? No  LIVING ENVIRONMENT: Lives with: lives with their spouse Lives in: House/apartment  PLOF: Independent  PATIENT GOALS: reduce dizziness  OBJECTIVE:  Cervical ROM:   Active A/PROM (deg) eval  Flexion WFLs  Extension WFLs  Right lateral flexion WFLs  Left lateral flexion WFLs  Right rotation WFLs  Left rotation WFLs  (Blank rows = not tested)  PATIENT SURVEYS:  Was not captured at eval for FOTO  VESTIBULAR ASSESSMENT: (Measures in this section from initial evaluation unless otherwise noted) GENERAL OBSERVATION: The patient ambulates into clinic independently.    SYMPTOM BEHAVIOR:  Subjective history: Sudden onset of dizziness, has h/o dizziness x years  Non-Vestibular symptoms: migraine symptoms  Type of dizziness: Spinning/Vertigo  Frequency: daily  Duration: seconds at this time (was minutes at onset)  Aggravating factors:  rolling to the left  Relieving factors: head stationary  Progression of symptoms: better  OCULOMOTOR EXAM:  Ocular Alignment: normal  Ocular ROM: No Limitations  Spontaneous Nystagmus: absent  Gaze-Induced Nystagmus: absent  Smooth Pursuits: intact  Saccades: intact   VESTIBULAR - OCULAR REFLEX:   Slow VOR: Normal  Head-Impulse Test: HIT Right: negative HIT Left: negative  POSITIONAL TESTING: Right Dix-Hallpike: no nystagmus and sensation of fogginess worsens Left Dix-Hallpike: no nystagmus and return to sitting provokes an unsteadiness and  patient needs to rest Right Roll Test: no nystagmus Left Roll Test: note a trace of upbeat, rotary nystagmus  [facility] Adult PT Treatment:                                                DATE: 05/24/23 Therapeutic Exercise: *** Manual Therapy: *** Neuromuscular re-ed: *** Therapeutic Activity: *** Gait: *** Modalities: *** Self Care: [facility] Adult PT Treatment:  DATE: 05/02/23 Therapeutic Exercise: Prone Scap retraction and depression Sidelying  Open book Seated AROM sidebending neck Levator SCM stretch Manual Therapy: STM bilat suboccipitals, bilat upper trap Trigger Point Dry-Needling  Treatment instructions: Expect mild to moderate muscle soreness. S/S of pneumothorax if dry needled over a lung field, and to seek immediate medical attention should they occur. Patient verbalized understanding of these instructions and education. Patient Consent Given: Yes Education handout provided: Yes Muscles treated: bilat upper trap Treatment response/outcome:  muscle twitch and palpable lengthening, has continued "foggy" feeling in head, no HA after DN-- notes overall improvement Neuromuscular re-ed: Positional testing L sidelying= feels internal dizziness, no room spinning-- also feels dizzy with return to sitting R sidelying=no symptoms, no nystagmus L dix hallpike=sensation of dizziness with no nystagmus viewed in room light R dix hallpike=none Bilat horizontal roll= none Habituation Austin Miles for HEP   [facility] Adult PT Treatment:                                                DATE: 04/24/23  Canalith Repositioning:  Epley Left: Number of Reps: 2 and Comment: symptoms improved second rep  PATIENT EDUCATION: Education details: nature of condition, plan for treatment Person educated: Patient Education method: Explanation Education comprehension: verbalized understanding  HOME EXERCISE PROGRAM: Access Code: [email or web address] URL:  [email or web address] Date: 05/02/2023 Prepared by: Margretta Ditty  Exercises - Brandt-Daroff Vestibular Exercise  - 2 x daily - 7 x weekly - 1 sets - 5 reps - Prone Scapular Slide with Shoulder Extension  - 1 x daily - 7 x weekly - 1 sets - 10 reps - Sidelying Thoracic Rotation with Open Book  - 1 x daily - 7 x weekly - 1 sets - 10 reps - Sternocleidomastoid Stretch  - 1 x daily - 7 x weekly - 1 sets - 3 reps - 30 seconds hold - Seated Levator Scapulae Stretch  - 1 x daily - 7 x weekly - 1 sets - 3 reps - 30 seconds hold - Seated Cervical Sidebending Stretch  - 1 x daily - 7 x weekly - 1 sets - 3 reps - 30 seconds hold  GOALS: Goals reviewed with patient? Yes  SHORT TERM GOALS: Target date: 05/24/23  The patient will be indep with habituation HEP. Baseline: to provide HEP Goal status: INITIAL   LONG TERM GOALS: Target date: 06/23/23  The patient will report bed mobility without episodes of spinning. Baseline:  Spinning with L roll Goal status: INITIAL  2.  The patient will have negative positional testing. Baseline:  + L horizontal roll and noted nystagmus when entering into 1st position with epley's Goal status: INITIAL  3.  The patient will verbalize understanding of self mgmt of symptoms.  Baseline:  to review home treatment for future mgmt Goal status: INITIAL   ASSESSMENT: CLINICAL IMPRESSION: ***The patient has improvement since last session. She has continued neck pain and HA and responded well today to DN.  EVAL: Patient is a 67 y.o. female who was seen today for physical therapy evaluation and treatment for vertigo. She is known to me from prior treatment for BPPV and vestibular issues. She presents today with + L BPPV initially noted with rotary nystagmus with L horizontal roll. She responded well to L epley's x 2 reps today. She also does get imbalance and HA at times-- PT to  monitor and adjust treatment as needed.    OBJECTIVE IMPAIRMENTS:  decreased balance and dizziness.   PLAN:  PT FREQUENCY: 1x/week  PT DURATION: 8 weeks  PLANNED INTERVENTIONS: Therapeutic exercises, Therapeutic activity, Neuromuscular re-education, Balance training, Gait training, Patient/Family education, Self Care, Joint mobilization, Vestibular training, Canalith repositioning, Dry Needling, and Manual therapy  PLAN FOR NEXT SESSION: recheck BPPV and treat, initiate HEP for habituation. Folllow up on neck and HA mgmt.   Kashena Novitski, PT 05/24/2023, 10:50 AM

## 2023-05-24 NOTE — Progress Notes (Signed)
Patient of Dr Trevor Mace. CC to Quillian Quince, MD, Surgeon Dr Victorino Dike, MD, and Archer Asa, MD, PT Margretta Ditty.   Severe sleep hypopnea, likely obstructive in origin. Strongly dependent on supine sleep position , which should be avoided if possible. There was no REM sleep which made it more difficult to distinguish between central and obstructive events. If supine sleep cannot be avoided , this patient needs to use PAP - and we have an appointment for in lab titration already ordered. I will add a sleep aid  prescription of xanax to help get enough sleep time.  PS : Would the patient be willing to wait for that test or want to start PAP auto-titration now ?   I already reccommended settings incase she wants to try auto CPAP

## 2023-05-28 ENCOUNTER — Telehealth: Payer: Self-pay | Admitting: Family Medicine

## 2023-05-28 NOTE — Telephone Encounter (Signed)
Pt said is in the donut hole and Galcanezumab-gnlm (EMGALITY) 120 MG/ML SOAJ would cost $175. Would like a call from the nurse to discuss if you had some samplles for Emgality.

## 2023-05-28 NOTE — Telephone Encounter (Signed)
Phone room: we are out of all injectable samples but she should mychart in often asking if we have received any.  Thanks,  Eavan Gonterman  Amy Lomax: Don't we need to find her a less cost prohibitive option. I do not think patient assistance is offered for migraine medications through medicare.

## 2023-05-30 ENCOUNTER — Other Ambulatory Visit (INDEPENDENT_AMBULATORY_CARE_PROVIDER_SITE_OTHER): Payer: Self-pay | Admitting: Family Medicine

## 2023-05-30 ENCOUNTER — Ambulatory Visit (INDEPENDENT_AMBULATORY_CARE_PROVIDER_SITE_OTHER): Payer: Medicare Other | Admitting: Family Medicine

## 2023-05-30 ENCOUNTER — Encounter (INDEPENDENT_AMBULATORY_CARE_PROVIDER_SITE_OTHER): Payer: Self-pay | Admitting: Family Medicine

## 2023-05-30 VITALS — BP 107/78 | HR 92 | Temp 97.6°F | Ht 66.0 in | Wt 177.0 lb

## 2023-05-30 DIAGNOSIS — R632 Polyphagia: Secondary | ICD-10-CM

## 2023-05-30 DIAGNOSIS — G4733 Obstructive sleep apnea (adult) (pediatric): Secondary | ICD-10-CM | POA: Diagnosis not present

## 2023-05-30 DIAGNOSIS — E669 Obesity, unspecified: Secondary | ICD-10-CM | POA: Diagnosis not present

## 2023-05-30 DIAGNOSIS — Z6828 Body mass index (BMI) 28.0-28.9, adult: Secondary | ICD-10-CM | POA: Diagnosis not present

## 2023-05-30 MED ORDER — TOPIRAMATE 25 MG PO TABS: ORAL_TABLET | ORAL | 0 refills | Status: DC

## 2023-05-30 MED ORDER — LOMAIRA 8 MG PO TABS
ORAL_TABLET | ORAL | 0 refills | Status: AC
Start: 2023-05-30 — End: 2023-06-27

## 2023-05-30 NOTE — Progress Notes (Unsigned)
Chief Complaint:   OBESITY Molly Giles is here to discuss her progress with her obesity treatment plan along with follow-up of her obesity related diagnoses. Molly Giles is on the Category 1 Plan and states she is following her eating plan approximately 75% of the time. Molly Giles states she is stretching 7 times per week.    Today's visit was #: 10 Starting weight: 199 lbs Starting date: 12/11/2022 Today's weight: 177 lbs Today's date: 05/30/2023 Total lbs lost to date: 22 Total lbs lost since last in-office visit: 0  Interim History: Patient is normally a Dr. Dalbert Giles patient.  She is here to see if she can start the Zepbound vials.  Patient is interested in medication intervention for weight loss.  She has been limited in activity due to recent foot surgery.   Subjective:   1. Polyphagia We discussed medication options to help treat.  Due to cost and possible side effects, patient opted for makeshift Qsymia.  PDMP was checked with no concerns and controlled substance contract was signed by the patient today.  Her starting weight is 177 pounds, needs to lose 8.5 pounds by January.  She has no history of nephrolithiasis.  2. OSA (obstructive sleep apnea) Patient had a recent sleep study that unfortunately was not split.  She sees [facility].  Assessment/Plan:   1. Polyphagia Patient agreed to start Lomaira 8 mg and Topamax at 25 mg with no refills.  - Phentermine HCl (LOMAIRA) 8 MG TABS; Take 0.5 tablets (4 mg total) by mouth daily at 6 (six) AM for 14 days, THEN 1 tablet (8 mg total) daily at 6 (six) AM for 14 days.  Dispense: 23 tablet; Refill: 0 - topiramate (TOPAMAX) 25 MG tablet; Take 1 tablet (25 mg total) by mouth daily for 14 days, THEN 2 tablets (50 mg total) daily for 14 days.  Dispense: 45 tablet; Refill: 0  2. OSA (obstructive sleep apnea) Patient will follow-up for further treatment and guidance with Molly Giles; significant position dependent.  3. BMI  28.0-28.9,adult  4. Obesity, Beginning BMI 32.1 Molly Giles is currently in the action stage of change. As such, her goal is to continue with weight loss efforts. She has agreed to the Category 1 Plan.   Exercise goals: All adults should avoid inactivity. Some physical activity is better than none, and adults who participate in any amount of physical activity gain some health benefits.  Patient is to incorporate more mind muscle connection for 10 to 15 minutes resistance training.  Behavioral modification strategies: increasing lean protein intake, meal planning and cooking strategies, keeping healthy foods in the home, and planning for success.  Molly Giles has agreed to follow-up with our clinic in 4 weeks. She was informed of the importance of frequent follow-up visits to maximize her success with intensive lifestyle modifications for her multiple health conditions.   Objective:   Blood pressure 107/78, pulse 92, temperature 97.6 F (36.4 C), height 5\' 6"  (1.676 m), weight 177 lb (80.3 kg), SpO2 98%. Body mass index is 28.57 kg/m.  General: Cooperative, alert, well developed, in no acute distress. HEENT: Conjunctivae and lids unremarkable. Cardiovascular: Regular rhythm.  Lungs: Normal work of breathing. Neurologic: No focal deficits.   Lab Results  Component Value Date   CREATININE 0.88 12/11/2022   BUN 16 12/11/2022   NA 138 12/11/2022   K 4.0 12/11/2022   CL 103 12/11/2022   CO2 19 (L) 12/11/2022   Lab Results  Component Value Date   ALT 20  12/11/2022   AST 19 12/11/2022   ALKPHOS 81 12/11/2022   BILITOT 0.3 12/11/2022   Lab Results  Component Value Date   HGBA1C 5.3 12/11/2022   HGBA1C 5.2 06/26/2019   HGBA1C 5.0 02/07/2018   HGBA1C 5.2 07/04/2017   HGBA1C 5.3 03/22/2017   Lab Results  Component Value Date   INSULIN 7.8 12/11/2022   INSULIN 8.3 05/07/2018   INSULIN 11.8 07/04/2017   INSULIN 10.5 03/22/2017   Lab Results  Component Value Date   TSH 1.970 05/17/2023    Lab Results  Component Value Date   CHOL 176 12/11/2022   HDL 54 12/11/2022   LDLCALC 101 (H) 12/11/2022   TRIG 117 12/11/2022   CHOLHDL 3.3 10/11/2021   Lab Results  Component Value Date   VD25OH 41.2 12/11/2022   VD25OH 43.3 05/07/2018   VD25OH 48.5 07/04/2017   Lab Results  Component Value Date   WBC 6.4 08/04/2020   HGB 13.5 08/04/2020   HCT 41 08/04/2020   MCV 95.0 07/05/2019   PLT 262 08/04/2020   No results found for: "IRON", "TIBC", "FERRITIN"  Attestation Statements:   Reviewed by clinician on day of visit: allergies, medications, problem list, medical history, surgical history, family history, social history, and previous encounter notes.   I, Molly Giles, am acting as transcriptionist for Molly Likes, MD.  I have reviewed the above documentation for accuracy and completeness, and I agree with the above. - Molly Likes, MD

## 2023-05-31 ENCOUNTER — Ambulatory Visit: Payer: Medicare Other | Attending: Physician Assistant | Admitting: Rehabilitative and Restorative Service Providers"

## 2023-05-31 ENCOUNTER — Encounter: Payer: Self-pay | Admitting: Rehabilitative and Restorative Service Providers"

## 2023-05-31 DIAGNOSIS — H8112 Benign paroxysmal vertigo, left ear: Secondary | ICD-10-CM | POA: Diagnosis present

## 2023-05-31 DIAGNOSIS — M542 Cervicalgia: Secondary | ICD-10-CM | POA: Insufficient documentation

## 2023-05-31 DIAGNOSIS — R42 Dizziness and giddiness: Secondary | ICD-10-CM | POA: Diagnosis present

## 2023-05-31 NOTE — Therapy (Signed)
OUTPATIENT PHYSICAL THERAPY VESTIBULAR TREATMENT     Patient Name: Molly Giles MRN: [id number] DOB:[birth year], 67 y.o., female Today's Date: 05/31/2023  END OF SESSION:  PT End of Session - 05/31/23 1410     Visit Number 3    Number of Visits 8    Date for PT Re-Evaluation 06/23/23    Authorization Type Medicare A and B    Progress Note Due on Visit 10    PT Start Time 1410    PT Stop Time 1450    PT Time Calculation (min) 40 min    Activity Tolerance Patient tolerated treatment well    Behavior During Therapy [facility] for tasks assessed/performed            Past Medical History:  Diagnosis Date   Abdominal bloating 05/10/2021   Abdominal discomfort, generalized 05/07/2016   Acute gastritis 11/19/2020   Allergic rhinoconjunctivitis 07/02/2015   Anal fissure 11/19/2020   Anterior to posterior tear of superior glenoid labrum of left shoulder 02/22/2021   Arthritis    Benign positional vertigo 04/2015   Responded well to Vestibular Rehab   Bruxism (teeth grinding)    Chronic contact dermatitis 08/06/2018   Per allergist Dr Edwyna Ready.    Chronic migraine 02/25/2016   Per Dr Lucia Gaskins review in notes from [facility] from September 2015. Showed total headache days last month 18. Severe headache days 7 days. Moderate headache days 5 days. Mild headache days last month sick days. Days without headache last month 10 days. Symptoms associated with photophobia, phonophobia, osmophobia, neck pain, dizziness, jaw pain, nasal congestion, vision disturbances, tingling and numbness, weakness and worsening with activity. Each headache attack last 3 hours depending on treatment in severity. Left side, the right side, easier side, the frontal area in the back of the head. Characterized as throbbing, pressure, tightness, squeezing, stabbing and burning    Chronic migraine without aura 05/15/2013    Dr Lucia Gaskins [facility]   Chronic tonsillitis 01/27/2019    DDD (degenerative disc disease), cervical 11/18/2018   DDD (degenerative disc disease), lumbar    Depression    Disc displacement, lumbar    Dysphagia 10/11/2021   Dysrhythmia    seen by dr Donnie Aho- not a problem since she has been on Bystolic   Episodic cluster headache, not intractable 03/06/2017   Essential hypertension 05/15/2013   Family history of adverse reaction to anesthesia    Brother- N/V   Family history of premature CAD 05/15/2013   Fibromyalgia syndrome 07/01/2015   Management by Dr Kathie Rhodes. Devashwar (Rheum)    GERD (gastroesophageal reflux disease) 12/16/2014   H/O seasonal allergies    Hallux rigidus of right foot 09/25/2022   Hammer toe    Left great toe   Hashimoto's thyroiditis    Per patient, diagnosed by Dr. Ferdinand Cava   Hearing loss of both ears 07/27/2015   mild to borderline moderate low frequency hearing loss improving to within normal limits bilaterally on audiology testing at [facility] in November 2016.     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   Hyperlipidemia 1998   Hypokalemia due to excessive gastrointestinal loss of potassium 07/28/2019   Hypothyroidism  Iliopsoas bursitis of left hip 05/08/2022   Impairment of balance 02/2015   Consequent of postconcussive syndrome   Injury of triangular fibrocartilage complex of left wrist 02/25/2019   Dx 02/25/19 Bradly Bienenstock IV MD ([facility])   Insulin resistance 07/04/2017   Internal hemorrhoid 01/28/2021   Internal hemorrhoid seen on colonoscopy 10/2020 Roderic Scarce MD [facility])   Interstitial cystitis    Irritable bowel syndrome with diarrhea 04/03/2016   Left ventricular hypertrophy, mild  02/25/2016   ECHOcardiogram report 06/17/15 showing EF55-60%, mild LVH and G1DD    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HCC) 03/10/2018   [facility]   Lumbar facet joint pain    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   Mood disorder (HCC)    Morbid obesity (HCC) 03/06/2017   Musculoskeletal neck pain 07/14/2015   Nocturnal hypoxemia 03/06/2017   Normal coronary arteries 05/14/2014   Obesity (BMI 30.0-34.9) 01/29/2017   Odynophagia 12/20/2021   Osteoarthritis of left hip 11/01/2015   MRI order by Dr Charlann Boxer (ortho) 10/2015 showed significant arthritis of left hip joint with cystic changes in femoral head c/w osteoarthritis   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, lumbar region 10/30/2011   Other insomnia 11/09/2016   Overweight    Pain in joint of left shoulder 11/09/2016   Pain in joint, multiple sites 11/18/2018   Palpitations 05/15/2013   [facility] manages   Perianal candidiasis 12/20/2021   Periodic limb movement sleep disorder 03/28/2017   Perirectal cyst 05/07/2016   Poison ivy dermatitis 03/24/2021   PONV (postoperative nausea and vomiting)    Positive ANA (antinuclear antibody) 11/18/2018   Post concussion syndrome 06/06/2015   Post concussive syndrome 07/14/2015   Ms Mesina's post-concussive syndrome manifesting in vertigo and headache, mood changes, poor balance, dizziness, and decreased concentration per Dr Lucia Gaskins at [facility].    Posterior vitreous detachment of right eye 2014   Premature menopause 12/20/2021   Rectal abscess 05/10/2021   S/P left THA, AA 07/25/2016   Sacroiliac inflammation (HCC) 08/18/2021   Shingles    Shortness of breath dyspnea    with exertion   Sicca syndrome (HCC) 11/18/2018   (+) ANA   Sleep walking and eating 03/06/2017   Snoring 03/06/2017   Spondylosis of lumbar region without myelopathy or radiculopathy 10/30/2011   TFC (triangular fibrocartilage complex) injury 02/25/2019   Thyroid nodule 08/11/2009    Findings: The thyroid gland is within normal limits in size.  The gland is diffusely inhomogeneous. A small solid nodule is noted in the lower pole  medially on the right of 7 x 6 x 8 mm. A small solid nodule is noted inferiorly on the left of 3 x 3 x 4 mm.  IMPRESSION:  The thyroid gland is within normal limits in size with only small solid nodules present, the largest of only 8 mm in diameter on the right.     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    Trochanteric bursitis, right hip 04/26/2020   Tubular adenoma of colon 01/28/2021   Colonoscopy screening 4 mm tubular Adenoma polyp Charlott Rakes, MD [facility])   Tubular adenoma of colon 01/28/2021   Colonoscopy screening 4 mm tubular Adenoma polyp Charlott Rakes, MD [facility])   Vasomotor symptoms due to menopause 04/19/2017   Vitamin D deficiency 05/08/2017   Vulvitis 07/22/2020   Yeast vaginitis 07/11/2019   Past Surgical History:  Procedure Laterality Date   arthroscopy Left  01/2022   with SAD and DCR, K. Supple MD   Bladder dilitation     x 3   BREAST BIOPSY Right 2011   Benign histology   CARDIOVASCULAR STRESS TEST  2000   Unremarkable per pt report   CARPOMETACARPAL JOINT ARTHROTOMY Right 2011   COLONOSCOPY     COLONOSCOPY WITH PROPOFOL N/A 04/21/2015   Procedure: COLONOSCOPY WITH PROPOFOL;  Surgeon: Jeani Hawking, MD;  Location: [facility] ENDOSCOPY;  Service: Endoscopy;  Laterality: N/A;   CYSTOSCOPY W/ DILATION OF BLADDER N/A    EPIDURAL BLOCK INJECTION Left 04/12/2016   Left Medial Nerve Block and Left L5 ramus block, Dr Sheran Luz    EPIDURAL BLOCK INJECTION  03/21/2016   Left L3-4 medial branch block and Left L5 & dorsal ramus block    EPIDURAL BLOCK INJECTION N/A 10/25/2016   Sheran Luz, MD. Lumbar medial branch block   EPIDURAL BLOCK INJECTION N/A 02/09/2017   Sheran Luz, MD.  Bilateral L3/4 medial branch block, bilateral L5 dorsal ramus block    EPIDURAL BLOCK INJECTION N/A 07/04/2017   Sheran Luz, MD   EXCISION MORTON'S NEUROMA Right 05/03/2023   Procedure: EXCISION MORTON'S NEUROMA;  Surgeon: Toni Arthurs, MD;  Location: [facility];  Service: Orthopedics;  Laterality: Right;  general, local by surgeon 60 MIN   FECAL TRANSPLANT  04/21/2015   Procedure: FECAL TRANSPLANT;  Surgeon: Jeani Hawking, MD;  Location: [facility] ENDOSCOPY;  Service: Endoscopy;;   HIP ARTHROPLASTY Left    HIP ARTHROSCOPY Left 03/06/2018   Left hip arthroplasty, redo for loose hip arthroplasty. Procedure at [facility]   INJECTION HIP INTRA ARTICULAR Left 11/2015   for OA by Dr Vilma Prader IMPLANT PLACEMENT  04/2021   Floyde Parkins MD ([facility])   INTERSTIM IMPLANT REVISION N/A 09/2021   Floyde Parkins MD ([facility])   LIGAMENT REPAIR Right 05/03/2023   Procedure: COLLATERAL LIGAMENT REPAIR;  Surgeon: Toni Arthurs, MD;  Location: [facility];  Service: Orthopedics;  Laterality: Right;  general, local by surgeon 60 MIN   OTHER SURGICAL HISTORY Left 2016   Left L3/L4 medial nerve block and Left L5 Dorsal Ramus block Dr Rosine Abe   TOTAL HIP ARTHROPLASTY Left 07/25/2016   Procedure: LEFT TOTAL HIP ARTHROPLASTY ANTERIOR APPROACH;  Surgeon: Durene Romans, MD;  Location: [facility];  Service: Orthopedics;  Laterality: Left;   WEIL OSTEOTOMY Right 05/03/2023   Procedure: RIGHT 2ND WEIL OSTEOTOMY;  Surgeon: Toni Arthurs, MD;  Location:  SURGERY CENTER;  Service: Orthopedics;  Laterality: Right;  general, local by surgeon 60 MIN   Patient Active Problem List   Diagnosis Date Noted   Vertigo 03/19/2023   Shingles 03/19/2023   Polyphagia 03/07/2023   Rash 02/07/2023   BMI 28.0-28.9,adult 02/07/2023   Low serum vitamin B12 01/10/2023   OSA (obstructive sleep apnea) 12/27/2022   BMI 30.0-30.9,adult 12/27/2022   Severe obstructive sleep apnea-hypopnea syndrome 12/22/2022   Hyperglycemia  12/11/2022   Depression screening 12/11/2022   Generalized obesity 12/11/2022   SOBOE (shortness of breath on exertion) 12/11/2022   Other fatigue 12/11/2022   Sleep related headaches 11/07/2022   Intractable episodic cluster headache 11/07/2022   Sleep walking and eating 11/07/2022   Ambien use disorder, 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   Psychophysiological insomnia 11/07/2022   Rhinitis 11/03/2022   Hallux rigidus of right foot 09/25/2022   Morton neuroma, right 09/25/2022   Chronic pain of right knee 05/08/2022   Arthrosis of  hand 12/20/2021   History of Clostridioides difficile colitis, required fecal transplantation 12/20/2021   Mixed hyperlipidemia 12/20/2021   Meibomian gland dysfunction (MGD) of both eyes 10/17/2021   Nuclear sclerosis of both eyes 10/17/2021   Epiretinal membrane (ERM) of left eye 10/17/2021   OAB (overactive bladder) 03/02/2021   Osteoarthritis of acromioclavicular joint 02/22/2021   Hyperhidrosis, scalp, 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   Chronic low back pain 05/14/2019   Cervical spondylosis 11/18/2018   DDD (degenerative disc disease), lumbar 11/18/2018   Keratoconjunctivitis sicca (HCC) 11/18/2018   Chronic contact dermatitis 08/06/2018   Primary osteoarthritis of left knee 06/20/2018   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HCC) 11/29/2017   Insulin resistance 07/04/2017   Vitamin D deficiency 05/08/2017   Periodic limb movement sleep disorder 03/28/2017   Obesity (BMI 30.0-34.9) 01/29/2017   Other insomnia 11/09/2016   Irritable bowel syndrome with diarrhea 04/03/2016   Left ventricular hypertrophy, mild 02/25/2016   Mood disorder (HCC) 07/02/2015   Allergic rhinoconjunctivitis 07/02/2015   Fibromyalgia syndrome 07/01/2015   Personal history of colonic polyps 07/01/2015   GERD (gastroesophageal reflux disease) 12/16/2014   Hypothyroidism 05/15/2013   Pure hypercholesterolemia 05/15/2013   Chronic interstitial cystitis 05/15/2013   Chronic migraine without aura 05/15/2013     PCP: Tawanna Cooler McDiarmid, MD REFERRING PROVIDER: Caro Laroche, DO   REFERRING DIAG: R42 (ICD-10-CM) - Vertigo   THERAPY DIAG:  Dizziness and giddiness  BPPV (benign paroxysmal positional vertigo), left  ONSET DATE: 03/19/23  Rationale for Evaluation and Treatment: Rehabilitation  SUBJECTIVE:   SUBJECTIVE STATEMENT: The patient began with cpap machine and also has had R foot surgery since the last session. She has had 2 episodes of dizziness when rolling to the left side and also notes neck is continuing to be a problem leading to HA and tension.   EVAL:The patient had a sudden onset of dizziness in July when at [facility]. She was changing in a dressing room and stood up quickly with room spinning. The initial phase of vertigo lasted x days and she had to cancel a trip. She also hurt her neck and shoulder during that time. She notes dizziness with rolling to the left that lasts for a few seconds.  Pt accompanied by: self  PERTINENT HISTORY: Migraines  PAIN:  Are you having pain? Hip and toe pain-- she just finished with PT.  We will not address unless it limits   PRECAUTIONS: None  WEIGHT BEARING RESTRICTIONS: No  FALLS: Has patient fallen in last 6 months? No  LIVING ENVIRONMENT: Lives with: lives with their spouse Lives in: House/apartment  PLOF: Independent  PATIENT GOALS: reduce dizziness  OBJECTIVE:  Cervical ROM:   Active A/PROM (deg) eval  Flexion WFLs  Extension WFLs  Right lateral flexion WFLs  Left lateral flexion WFLs  Right rotation WFLs  Left rotation WFLs  (Blank rows = not tested)  PATIENT SURVEYS:  Was not captured at eval for FOTO  VESTIBULAR ASSESSMENT: (Measures in this section from initial evaluation unless otherwise noted) GENERAL OBSERVATION: The patient ambulates into clinic independently.    SYMPTOM BEHAVIOR:  Subjective history: Sudden onset of dizziness, has h/o dizziness x years  Non-Vestibular symptoms: migraine symptoms  Type  of dizziness: Spinning/Vertigo  Frequency: daily  Duration: seconds at this time (was minutes at onset)  Aggravating factors:  rolling to the left  Relieving factors: head stationary  Progression of symptoms: better  OCULOMOTOR EXAM:  Ocular Alignment: normal  Ocular ROM: No Limitations  Spontaneous Nystagmus: absent  Gaze-Induced Nystagmus:  absent  Smooth Pursuits: intact  Saccades: intact   VESTIBULAR - OCULAR REFLEX:   Slow VOR: Normal  Head-Impulse Test: HIT Right: negative HIT Left: negative  POSITIONAL TESTING: Right Dix-Hallpike: no nystagmus and sensation of fogginess worsens Left Dix-Hallpike: no nystagmus and return to sitting provokes an unsteadiness and patient needs to rest Right Roll Test: no nystagmus Left Roll Test: note a trace of upbeat, rotary nystagmus  [facility] Adult PT Treatment:                                                DATE: 05/31/23 Therapeutic Exercise: Seated Levator stretch Upper trap stretch SCM stretch Supine Scalene stretch-- see HEP Sidelying Open book thoracic mobility Prone Shoulder extension with scapular depression Manual Therapy: STM upper trap, bilat scalenes, upper thoracic spine, cervical multifidi Trigger Point Dry-Needling  Treatment instructions: Expect mild to moderate muscle soreness. Drink plenty of fluids and use heat/ice if sore Patient Consent Given: Yes Education handout provided: Previously provided Muscles treated: upper trap and cervical multifidi Treatment response/outcome: palpable lenthening Neuromuscular re-ed: Positional testing Brandt daroff x 1 reps Horizontal rolling x 3 reps Habituation Horizontal rolling-- provokes a level of fogginess   [facility] Adult PT Treatment:                                                DATE: 05/02/23 Therapeutic Exercise: Prone Scap retraction and depression Sidelying  Open book Seated AROM sidebending neck Levator SCM stretch Manual Therapy: STM bilat suboccipitals,  bilat upper trap Trigger Point Dry-Needling  Treatment instructions: Expect mild to moderate muscle soreness. S/S of pneumothorax if dry needled over a lung field, and to seek immediate medical attention should they occur. Patient verbalized understanding of these instructions and education. Patient Consent Given: Yes Education handout provided: Yes Muscles treated: bilat upper trap Treatment response/outcome:  muscle twitch and palpable lengthening, has continued "foggy" feeling in head, no HA after DN-- notes overall improvement Neuromuscular re-ed: Positional testing L sidelying= feels internal dizziness, no room spinning-- also feels dizzy with return to sitting R sidelying=no symptoms, no nystagmus L dix hallpike=sensation of dizziness with no nystagmus viewed in room light R dix hallpike=none Bilat horizontal roll= none Habituation Austin Miles for HEP   [facility] Adult PT Treatment:                                                DATE: 04/24/23  Canalith Repositioning:  Epley Left: Number of Reps: 2 and Comment: symptoms improved second rep  PATIENT EDUCATION: Education details: nature of condition, plan for treatment Person educated: Patient Education method: Explanation Education comprehension: verbalized understanding  HOME EXERCISE PROGRAM: Access Code: [email or web address] URL: [email or web address] Date: 05/02/2023 Prepared by: Margretta Ditty  Exercises - Brandt-Daroff Vestibular Exercise  - 2 x daily - 7 x weekly - 1 sets - 5 reps - Prone Scapular Slide with Shoulder Extension  - 1 x daily - 7 x weekly - 1 sets - 10 reps - Sidelying Thoracic Rotation with Open Book  - 1 x daily - 7 x weekly - 1 sets - 10  reps - Sternocleidomastoid Stretch  - 1 x daily - 7 x weekly - 1 sets - 3 reps - 30 seconds hold - Seated Levator Scapulae Stretch  - 1 x daily - 7 x weekly - 1 sets - 3 reps - 30 seconds hold - Seated Cervical Sidebending Stretch  - 1 x daily - 7 x weekly - 1  sets - 3 reps - 30 seconds hold  GOALS: Goals reviewed with patient? Yes  SHORT TERM GOALS: Target date: 05/24/23  The patient will be indep with habituation HEP. Baseline: to provide HEP Goal status:MET   LONG TERM GOALS: Target date: 06/23/23  The patient will report bed mobility without episodes of spinning. Baseline:  Spinning with L roll Goal status: MET  2.  The patient will have negative positional testing. Baseline:  + L horizontal roll and noted nystagmus when entering into 1st position with epley's Goal status: MET  3.  The patient will verbalize understanding of self mgmt of symptoms.  Baseline:  to review home treatment for future mgmt Goal status: INITIAL   ASSESSMENT: CLINICAL IMPRESSION: The patient has met STG for HEP and LTGs for dizziness. She continues with some chronic neck and HA issues improved with HEP and dry needling + STM. PT to continue to progress to patient tolerance working on self mgmt for discharge.  EVAL: Patient is a 67 y.o. female who was seen today for physical therapy evaluation and treatment for vertigo. She is known to me from prior treatment for BPPV and vestibular issues. She presents today with + L BPPV initially noted with rotary nystagmus with L horizontal roll. She responded well to L epley's x 2 reps today. She also does get imbalance and HA at times-- PT to monitor and adjust treatment as needed.    OBJECTIVE IMPAIRMENTS: decreased balance and dizziness.   PLAN:  PT FREQUENCY: 1x/week  PT DURATION: 8 weeks  PLANNED INTERVENTIONS: Therapeutic exercises, Therapeutic activity, Neuromuscular re-education, Balance training, Gait training, Patient/Family education, Self Care, Joint mobilization, Vestibular training, Canalith repositioning, Dry Needling, and Manual therapy  PLAN FOR NEXT SESSION:  Folllow up on neck and HA mgmt. D/c if progressing well.    Wilford Merryfield, PT 05/31/2023, 2:10 PM

## 2023-06-05 ENCOUNTER — Ambulatory Visit
Admission: RE | Admit: 2023-06-05 | Discharge: 2023-06-05 | Disposition: A | Discharge status: Home / Self Care | Payer: Medicare Other | Source: Ambulatory Visit | Attending: Family Medicine | Admitting: Family Medicine

## 2023-06-05 DIAGNOSIS — Z1231 Encounter for screening mammogram for malignant neoplasm of breast: Secondary | ICD-10-CM

## 2023-06-07 ENCOUNTER — Encounter: Payer: Self-pay | Admitting: Rehabilitative and Restorative Service Providers"

## 2023-06-07 ENCOUNTER — Ambulatory Visit: Payer: Medicare Other | Admitting: Rehabilitative and Restorative Service Providers"

## 2023-06-07 ENCOUNTER — Telehealth: Payer: Self-pay

## 2023-06-07 DIAGNOSIS — R42 Dizziness and giddiness: Secondary | ICD-10-CM

## 2023-06-07 DIAGNOSIS — H8112 Benign paroxysmal vertigo, left ear: Secondary | ICD-10-CM

## 2023-06-07 DIAGNOSIS — M542 Cervicalgia: Secondary | ICD-10-CM

## 2023-06-07 NOTE — Telephone Encounter (Signed)
Patient LVM on Sleep Lab phone stating she has read the MyChart msg/results from Dr. Vickey Huger but is unclear on what is to happen next and would like someone to call her and explain what to do. Please call patient back at [phone]

## 2023-06-07 NOTE — Therapy (Signed)
OUTPATIENT PHYSICAL THERAPY VESTIBULAR TREATMENT     Patient Name: Molly Giles MRN: [id number] DOB:1956-03-23, 67 y.o., female Today's Date: 06/07/2023  END OF SESSION:  PT End of Session - 06/07/23 1109     Visit Number 4    Number of Visits 8    Date for PT Re-Evaluation 06/23/23    Authorization Type Medicare A and B    Progress Note Due on Visit 10    PT Start Time 1106    PT Stop Time 1145    PT Time Calculation (min) 39 min    Activity Tolerance Patient tolerated treatment well    Behavior During Therapy [facility] for tasks assessed/performed            Past Medical History:  Diagnosis Date   Abdominal bloating 05/10/2021   Abdominal discomfort, generalized 05/07/2016   Acute gastritis 11/19/2020   Allergic rhinoconjunctivitis 07/02/2015   Anal fissure 11/19/2020   Anterior to posterior tear of superior glenoid labrum of left shoulder 02/22/2021   Arthritis    Benign positional vertigo 04/2015   Responded well to Vestibular Rehab   Bruxism (teeth grinding)    Chronic contact dermatitis 08/06/2018   Per allergist Dr Edwyna Ready.    Chronic migraine 02/25/2016   Per Dr Lucia Gaskins review in notes from [facility] from September 2015. Showed total headache days last month 18. Severe headache days 7 days. Moderate headache days 5 days. Mild headache days last month sick days. Days without headache last month 10 days. Symptoms associated with photophobia, phonophobia, osmophobia, neck pain, dizziness, jaw pain, nasal congestion, vision disturbances, tingling and numbness, weakness and worsening with activity. Each headache attack last 3 hours depending on treatment in severity. Left side, the right side, easier side, the frontal area in the back of the head. Characterized as throbbing, pressure, tightness, squeezing, stabbing and burning    Chronic migraine without aura 05/15/2013    Dr Lucia Gaskins [facility]   Chronic tonsillitis 01/27/2019    DDD (degenerative disc disease), cervical 11/18/2018   DDD (degenerative disc disease), lumbar    Depression    Disc displacement, lumbar    Dysphagia 10/11/2021   Dysrhythmia    seen by dr Donnie Aho- not a problem since she has been on Bystolic   Episodic cluster headache, not intractable 03/06/2017   Essential hypertension 05/15/2013   Family history of adverse reaction to anesthesia    Brother- N/V   Family history of premature CAD 05/15/2013   Fibromyalgia syndrome 07/01/2015   Management by Dr Kathie Rhodes. Devashwar (Rheum)    GERD (gastroesophageal reflux disease) 12/16/2014   H/O seasonal allergies    Hallux rigidus of right foot 09/25/2022   Hammer toe    Left great toe   Hashimoto's thyroiditis    Per patient, diagnosed by Dr. Ferdinand Cava   Hearing loss of both ears 07/27/2015   mild to borderline moderate low frequency hearing loss improving to within normal limits bilaterally on audiology testing at [facility] in November 2016.     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   Hyperlipidemia 1998   Hypokalemia due to excessive gastrointestinal loss of potassium 07/28/2019   Hypothyroidism  Iliopsoas bursitis of left hip 05/08/2022   Impairment of balance 02/2015   Consequent of postconcussive syndrome   Injury of triangular fibrocartilage complex of left wrist 02/25/2019   Dx 02/25/19 Bradly Bienenstock IV MD ([facility])   Insulin resistance 07/04/2017   Internal hemorrhoid 01/28/2021   Internal hemorrhoid seen on colonoscopy 10/2020 Roderic Scarce MD [facility])   Interstitial cystitis    Irritable bowel syndrome with diarrhea 04/03/2016   Left ventricular hypertrophy, mild  02/25/2016   ECHOcardiogram report 06/17/15 showing EF55-60%, mild LVH and G1DD    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HCC) 03/10/2018   [facility]   Lumbar facet joint pain    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   Mood disorder (HCC)    Morbid obesity (HCC) 03/06/2017   Musculoskeletal neck pain 07/14/2015   Nocturnal hypoxemia 03/06/2017   Normal coronary arteries 05/14/2014   Obesity (BMI 30.0-34.9) 01/29/2017   Odynophagia 12/20/2021   Osteoarthritis of left hip 11/01/2015   MRI order by Dr Charlann Boxer (ortho) 10/2015 showed significant arthritis of left hip joint with cystic changes in femoral head c/w osteoarthritis   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, lumbar region 10/30/2011   Other insomnia 11/09/2016   Overweight    Pain in joint of left shoulder 11/09/2016   Pain in joint, multiple sites 11/18/2018   Palpitations 05/15/2013   [facility] manages   Perianal candidiasis 12/20/2021   Periodic limb movement sleep disorder 03/28/2017   Perirectal cyst 05/07/2016   Poison ivy dermatitis 03/24/2021   PONV (postoperative nausea and vomiting)    Positive ANA (antinuclear antibody) 11/18/2018   Post concussion syndrome 06/06/2015   Post concussive syndrome 07/14/2015   Ms Mukherjee's post-concussive syndrome manifesting in vertigo and headache, mood changes, poor balance, dizziness, and decreased concentration per Dr Lucia Gaskins at [facility].    Posterior vitreous detachment of right eye 2014   Premature menopause 12/20/2021   Rectal abscess 05/10/2021   S/P left THA, AA 07/25/2016   Sacroiliac inflammation (HCC) 08/18/2021   Shingles    Shortness of breath dyspnea    with exertion   Sicca syndrome (HCC) 11/18/2018   (+) ANA   Sleep walking and eating 03/06/2017   Snoring 03/06/2017   Spondylosis of lumbar region without myelopathy or radiculopathy 10/30/2011   TFC (triangular fibrocartilage complex) injury 02/25/2019   Thyroid nodule 08/11/2009    Findings: The thyroid gland is within normal limits in size.  The gland is diffusely inhomogeneous. A small solid nodule is noted in the lower pole  medially on the right of 7 x 6 x 8 mm. A small solid nodule is noted inferiorly on the left of 3 x 3 x 4 mm.  IMPRESSION:  The thyroid gland is within normal limits in size with only small solid nodules present, the largest of only 8 mm in diameter on the right.     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    Trochanteric bursitis, right hip 04/26/2020   Tubular adenoma of colon 01/28/2021   Colonoscopy screening 4 mm tubular Adenoma polyp Charlott Rakes, MD [facility])   Tubular adenoma of colon 01/28/2021   Colonoscopy screening 4 mm tubular Adenoma polyp Charlott Rakes, MD [facility])   Vasomotor symptoms due to menopause 04/19/2017   Vitamin D deficiency 05/08/2017   Vulvitis 07/22/2020   Yeast vaginitis 07/11/2019   Past Surgical History:  Procedure Laterality Date   arthroscopy Left  01/2022   with SAD and DCR, K. Supple MD   Bladder dilitation     x 3   BREAST BIOPSY Right 2011   Benign histology   CARDIOVASCULAR STRESS TEST  2000   Unremarkable per pt report   CARPOMETACARPAL JOINT ARTHROTOMY Right 2011   COLONOSCOPY     COLONOSCOPY WITH PROPOFOL N/A 04/21/2015   Procedure: COLONOSCOPY WITH PROPOFOL;  Surgeon: Jeani Hawking, MD;  Location: [facility] ENDOSCOPY;  Service: Endoscopy;  Laterality: N/A;   CYSTOSCOPY W/ DILATION OF BLADDER N/A    EPIDURAL BLOCK INJECTION Left 04/12/2016   Left Medial Nerve Block and Left L5 ramus block, Dr Sheran Luz    EPIDURAL BLOCK INJECTION  03/21/2016   Left L3-4 medial branch block and Left L5 & dorsal ramus block    EPIDURAL BLOCK INJECTION N/A 10/25/2016   Sheran Luz, MD. Lumbar medial branch block   EPIDURAL BLOCK INJECTION N/A 02/09/2017   Sheran Luz, MD.  Bilateral L3/4 medial branch block, bilateral L5 dorsal ramus block    EPIDURAL BLOCK INJECTION N/A 07/04/2017   Sheran Luz, MD   EXCISION MORTON'S NEUROMA Right 05/03/2023   Procedure: EXCISION MORTON'S NEUROMA;  Surgeon: Toni Arthurs, MD;  Location: [facility];  Service: Orthopedics;  Laterality: Right;  general, local by surgeon 60 MIN   FECAL TRANSPLANT  04/21/2015   Procedure: FECAL TRANSPLANT;  Surgeon: Jeani Hawking, MD;  Location: [facility] ENDOSCOPY;  Service: Endoscopy;;   HIP ARTHROPLASTY Left    HIP ARTHROSCOPY Left 03/06/2018   Left hip arthroplasty, redo for loose hip arthroplasty. Procedure at [facility]   INJECTION HIP INTRA ARTICULAR Left 11/2015   for OA by Dr Vilma Prader IMPLANT PLACEMENT  04/2021   Floyde Parkins MD ([facility])   INTERSTIM IMPLANT REVISION N/A 09/2021   Floyde Parkins MD ([facility])   LIGAMENT REPAIR Right 05/03/2023   Procedure: COLLATERAL LIGAMENT REPAIR;  Surgeon: Toni Arthurs, MD;  Location: [facility];  Service: Orthopedics;  Laterality: Right;  general, local by surgeon 60 MIN   OTHER SURGICAL HISTORY Left 2016   Left L3/L4 medial nerve block and Left L5 Dorsal Ramus block Dr Rosine Abe   TOTAL HIP ARTHROPLASTY Left 07/25/2016   Procedure: LEFT TOTAL HIP ARTHROPLASTY ANTERIOR APPROACH;  Surgeon: Durene Romans, MD;  Location: [facility];  Service: Orthopedics;  Laterality: Left;   WEIL OSTEOTOMY Right 05/03/2023   Procedure: RIGHT 2ND WEIL OSTEOTOMY;  Surgeon: Toni Arthurs, MD;  Location: [facility];  Service: Orthopedics;  Laterality: Right;  general, local by surgeon 60 MIN   Patient Active Problem List   Diagnosis Date Noted   Vertigo 03/19/2023   Shingles 03/19/2023   Polyphagia 03/07/2023   Rash 02/07/2023   BMI 28.0-28.9,adult 02/07/2023   Low serum vitamin B12 01/10/2023   OSA (obstructive sleep apnea) 12/27/2022   BMI 30.0-30.9,adult 12/27/2022   Severe obstructive sleep apnea-hypopnea syndrome 12/22/2022   Hyperglycemia  12/11/2022   Depression screening 12/11/2022   Generalized obesity 12/11/2022   SOBOE (shortness of breath on exertion) 12/11/2022   Other fatigue 12/11/2022   Sleep related headaches 11/07/2022   Intractable episodic cluster headache 11/07/2022   Sleep walking and eating 11/07/2022   Ambien use disorder, 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   Psychophysiological insomnia 11/07/2022   Rhinitis 11/03/2022   Hallux rigidus of right foot 09/25/2022   Morton neuroma, right 09/25/2022   Chronic pain of right knee 05/08/2022   Arthrosis of  hand 12/20/2021   History of Clostridioides difficile colitis, required fecal transplantation 12/20/2021   Mixed hyperlipidemia 12/20/2021   Meibomian gland dysfunction (MGD) of both eyes 10/17/2021   Nuclear sclerosis of both eyes 10/17/2021   Epiretinal membrane (ERM) of left eye 10/17/2021   OAB (overactive bladder) 03/02/2021   Osteoarthritis of acromioclavicular joint 02/22/2021   Hyperhidrosis, scalp, 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   Chronic low back pain 05/14/2019   Cervical spondylosis 11/18/2018   DDD (degenerative disc disease), lumbar 11/18/2018   Keratoconjunctivitis sicca (HCC) 11/18/2018   Chronic contact dermatitis 08/06/2018   Primary osteoarthritis of left knee 06/20/2018   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HCC) 11/29/2017   Insulin resistance 07/04/2017   Vitamin D deficiency 05/08/2017   Periodic limb movement sleep disorder 03/28/2017   Obesity (BMI 30.0-34.9) 01/29/2017   Other insomnia 11/09/2016   Irritable bowel syndrome with diarrhea 04/03/2016   Left ventricular hypertrophy, mild 02/25/2016   Mood disorder (HCC) 07/02/2015   Allergic rhinoconjunctivitis 07/02/2015   Fibromyalgia syndrome 07/01/2015   Personal history of colonic polyps 07/01/2015   GERD (gastroesophageal reflux disease) 12/16/2014   Hypothyroidism 05/15/2013   Pure hypercholesterolemia 05/15/2013   Chronic interstitial cystitis 05/15/2013   Chronic migraine without aura 05/15/2013     PCP: Tawanna Cooler McDiarmid, MD REFERRING PROVIDER: Caro Laroche, DO   REFERRING DIAG: R42 (ICD-10-CM) - Vertigo   THERAPY DIAG:  Dizziness and giddiness  BPPV (benign paroxysmal positional vertigo), left  Cervicalgia  ONSET DATE: 03/19/23  Rationale for Evaluation and Treatment: Rehabilitation  SUBJECTIVE:   SUBJECTIVE STATEMENT: The patient reports she was sore after dry needling last session. She had one episode of a quick moment of dizziness.  Her neck is sore today-- and this may be related to sleeping position.  EVAL:The patient had a sudden onset of dizziness in July when at [facility]. She was changing in a dressing room and stood up quickly with room spinning. The initial phase of vertigo lasted x days and she had to cancel a trip. She also hurt her neck and shoulder during that time. She notes dizziness with rolling to the left that lasts for a few seconds.  Pt accompanied by: self  PERTINENT HISTORY: Migraines  PAIN:  Are you having pain? Yes: NPRS scale: 4/10 Pain location: neck Pain description: soreness Aggravating factors: turn to the R-- gets L pain Relieving factors: head at neutral   PRECAUTIONS: None  WEIGHT BEARING RESTRICTIONS: No  FALLS: Has patient fallen in last 6 months? No  LIVING ENVIRONMENT: Lives with: lives with their spouse Lives in: House/apartment  PLOF: Independent  PATIENT GOALS: reduce dizziness  OBJECTIVE:  Cervical ROM:   Active A/PROM (deg) eval  Flexion WFLs  Extension WFLs  Right lateral flexion WFLs  Left lateral flexion WFLs  Right rotation WFLs  Left rotation WFLs  (Blank rows = not tested)  PATIENT SURVEYS:  Was not captured at eval for FOTO  VESTIBULAR ASSESSMENT: (Measures in this section from initial evaluation unless otherwise noted) GENERAL OBSERVATION: The patient ambulates into clinic independently.    SYMPTOM BEHAVIOR:  Subjective history: Sudden onset of dizziness, has h/o dizziness x  years  Non-Vestibular symptoms: migraine symptoms  Type of dizziness: Spinning/Vertigo  Frequency: daily  Duration: seconds at this time (was minutes at onset)  Aggravating factors:  rolling to the left  Relieving factors: head stationary  Progression of symptoms: better  OCULOMOTOR EXAM:  Ocular Alignment: normal  Ocular ROM: No Limitations  Spontaneous Nystagmus: absent  Gaze-Induced Nystagmus: absent  Smooth Pursuits: intact  Saccades: intact   VESTIBULAR - OCULAR REFLEX:   Slow VOR: Normal  Head-Impulse Test: HIT Right: negative HIT Left: negative  POSITIONAL TESTING: Right Dix-Hallpike: no nystagmus and sensation of fogginess worsens Left Dix-Hallpike: no nystagmus and return to sitting provokes an unsteadiness and patient needs to rest Right Roll Test: no nystagmus Left Roll Test: note a trace of upbeat, rotary nystagmus   [facility] Adult PT Treatment:                                                DATE: 06/07/23 Therapeutic Exercise: Standing Row x red band x 12 reps Overhead press 2# x 15 reps *provided handouts for HEP Manual Therapy: STM scalenes, suboccipitals, rhomboid, latissimus dorsi Neuromuscular re-ed: Habituation Brandt daroff x 2 re[s Rolling R<>L x 3 reps Notes a sensation of head/eyes needing to catch up with movement Self Care: Discussed gym routine and provided information on HEP to begin seated row, lat pull and overhead press as she returns to gym routine  [facility] Adult PT Treatment:                                                DATE: 05/31/23 Therapeutic Exercise: Seated Levator stretch Upper trap stretch SCM stretch Supine Scalene stretch-- see HEP Sidelying Open book thoracic mobility Prone Shoulder extension with scapular depression Manual Therapy: STM upper trap, bilat scalenes, upper thoracic spine, cervical multifidi Trigger Point Dry-Needling  Treatment instructions: Expect mild to moderate muscle soreness. Drink plenty of fluids and  use heat/ice if sore Patient Consent Given: Yes Education handout provided: Previously provided Muscles treated: upper trap and cervical multifidi Treatment response/outcome: palpable lenthening Neuromuscular re-ed: Positional testing Brandt daroff x 1 reps Horizontal rolling x 3 reps Habituation Horizontal rolling-- provokes a level of fogginess   [facility] Adult PT Treatment:                                                DATE: 05/02/23 Therapeutic Exercise: Prone Scap retraction and depression Sidelying  Open book Seated AROM sidebending neck Levator SCM stretch Manual Therapy: STM bilat suboccipitals, bilat upper trap Trigger Point Dry-Needling  Treatment instructions: Expect mild to moderate muscle soreness. S/S of pneumothorax if dry needled over a lung field, and to seek immediate medical attention should they occur. Patient verbalized understanding of these instructions and education. Patient Consent Given: Yes Education handout provided: Yes Muscles treated: bilat upper trap Treatment response/outcome:  muscle twitch and palpable lengthening, has continued "foggy" feeling in head, no HA after DN-- notes overall improvement Neuromuscular re-ed: Positional testing L sidelying= feels internal dizziness, no room spinning-- also feels dizzy with return to sitting R sidelying=no symptoms, no nystagmus L dix hallpike=sensation of dizziness with no nystagmus viewed in room light R dix hallpike=none Bilat horizontal roll= none Habituation Austin Miles for HEP   [facility] Adult PT Treatment:  DATE: 04/24/23  Canalith Repositioning:  Epley Left: Number of Reps: 2 and Comment: symptoms improved second rep  PATIENT EDUCATION: Education details: nature of condition, plan for treatment Person educated: Patient Education method: Explanation Education comprehension: verbalized understanding  HOME EXERCISE PROGRAM: Access Code:  [email or web address] URL: [email or web address] Date: 06/07/2023 Prepared by: Margretta Ditty  Program Notes GYM EXERCISES TO CONSIDER:seated row machine, lat pulls, overhead press with dumb bells  Exercises - Right Sidelying to Left Sidelying Vestibular Habituation  - 2 x daily - 7 x weekly - 1 sets - 5 reps - Brandt-Daroff Vestibular Exercise  - 2 x daily - 7 x weekly - 1 sets - 5 reps - Prone Scapular Slide with Shoulder Extension  - 1 x daily - 7 x weekly - 1 sets - 10 reps - Sidelying Thoracic Rotation with Open Book  - 1 x daily - 7 x weekly - 1 sets - 10 reps - Sternocleidomastoid Stretch  - 1 x daily - 7 x weekly - 1 sets - 3 reps - 30 seconds hold - Seated Levator Scapulae Stretch  - 1 x daily - 7 x weekly - 1 sets - 3 reps - 30 seconds hold - Seated Cervical Sidebending Stretch  - 1 x daily - 7 x weekly - 1 sets - 3 reps - 30 seconds hold - Supine Anterior Scalene Stretch  - 2 x daily - 7 x weekly - 1 sets - 3 reps - 30 seconds hold - Standing Row with Anchored Resistance  - 1 x daily - 3 x weekly - 2 sets - 15 reps - Standing Single Arm Shoulder Press with Dumbbell  - 1 x daily - 3 x weekly - 2 sets - 15 reps  GOALS: Goals reviewed with patient? Yes  SHORT TERM GOALS: Target date: 05/24/23  The patient will be indep with habituation HEP. Baseline: to provide HEP Goal status:MET   LONG TERM GOALS: Target date: 06/23/23  The patient will report bed mobility without episodes of spinning. Baseline:  Spinning with L roll Goal status: MET  2.  The patient will have negative positional testing. Baseline:  + L horizontal roll and noted nystagmus when entering into 1st position with epley's Goal status: MET  3.  The patient will verbalize understanding of self mgmt of symptoms.  Baseline:  to review home treatment for future mgmt Goal status: INITIAL   ASSESSMENT: CLINICAL IMPRESSION: The patient continues with some chronic neck and HA issues improved with HEP and  dry needling + STM. PT added further strengthening today for postural stability and plans to do one further follow up. PT to continue to progress to patient tolerance working on self mgmt for discharge.  EVAL: Patient is a 67 y.o. female who was seen today for physical therapy evaluation and treatment for vertigo. She is known to me from prior treatment for BPPV and vestibular issues. She presents today with + L BPPV initially noted with rotary nystagmus with L horizontal roll. She responded well to L epley's x 2 reps today. She also does get imbalance and HA at times-- PT to monitor and adjust treatment as needed.    OBJECTIVE IMPAIRMENTS: decreased balance and dizziness.   PLAN:  PT FREQUENCY: 1x/week  PT DURATION: 8 weeks  PLANNED INTERVENTIONS: Therapeutic exercises, Therapeutic activity, Neuromuscular re-education, Balance training, Gait training, Patient/Family education, Self Care, Joint mobilization, Vestibular training, Canalith repositioning, Dry Needling, and Manual therapy  PLAN FOR NEXT SESSION:  Folllow up on neck and HA  mgmt. Check LTGs and d/c if appropriate, renew if further visits indicated.   Kathlyne Loud, PT 06/07/2023, 11:10 AM

## 2023-06-12 ENCOUNTER — Other Ambulatory Visit (INDEPENDENT_AMBULATORY_CARE_PROVIDER_SITE_OTHER): Payer: Self-pay | Admitting: Family Medicine

## 2023-06-12 ENCOUNTER — Ambulatory Visit (INDEPENDENT_AMBULATORY_CARE_PROVIDER_SITE_OTHER): Payer: Medicare Other | Admitting: Family Medicine

## 2023-06-12 ENCOUNTER — Encounter (INDEPENDENT_AMBULATORY_CARE_PROVIDER_SITE_OTHER): Payer: Self-pay | Admitting: Family Medicine

## 2023-06-12 VITALS — BP 112/79 | HR 81 | Temp 98.9°F | Ht 66.0 in | Wt 176.0 lb

## 2023-06-12 DIAGNOSIS — Z6828 Body mass index (BMI) 28.0-28.9, adult: Secondary | ICD-10-CM | POA: Diagnosis not present

## 2023-06-12 DIAGNOSIS — F3289 Other specified depressive episodes: Secondary | ICD-10-CM | POA: Diagnosis not present

## 2023-06-12 DIAGNOSIS — R632 Polyphagia: Secondary | ICD-10-CM

## 2023-06-12 DIAGNOSIS — E669 Obesity, unspecified: Secondary | ICD-10-CM

## 2023-06-12 MED ORDER — ZEPBOUND 2.5 MG/0.5ML ~~LOC~~ SOLN
2.5000 mg | SUBCUTANEOUS | 0 refills | Status: DC
Start: 2023-06-12 — End: 2023-07-10

## 2023-06-12 MED ORDER — TOPIRAMATE 25 MG PO TABS
ORAL_TABLET | ORAL | 0 refills | Status: DC
Start: 2023-06-12 — End: 2023-09-10

## 2023-06-12 NOTE — Progress Notes (Signed)
Chief Complaint:   OBESITY Molly Giles is here to discuss her progress with her obesity treatment plan along with follow-up of her obesity related diagnoses. Molly Giles is on the Category 1 Plan and states she is following her eating plan approximately 85% of the time. Molly Giles states she is stretching for 10 minutes 7 times per week.  Today's visit was #: 11 Starting weight: 199 lbs Starting date: 12/11/2022 Today's weight: 176 lbs Today's date: 06/12/2023 Total lbs lost to date: 23 Total lbs lost since last in-office visit: 1  Interim History: Patient has questions about starting Zepbound.  She is struggling with polyphagia and with the new cost with the vials she is open to trying it.  Subjective:   1. Polyphagia Patient notes increased nausea on phentermine.  She did lose a little bit of weight, but she feels the benefit is not worth the nausea.  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  Assessment/Plan:   1. Polyphagia Patient will continue Topamax at 25 mg for 14 days, then increase to 50 mg, and we will refill for 1 month.  - topiramate (TOPAMAX) 25 MG tablet; Take 1 tablet (25 mg total) by mouth daily for 14 days, THEN 2 tablets (50 mg total) daily for 14 days.  Dispense: 45 tablet; Refill: 0  2. Emotinal Eating Behavior Patient agreed to discontinue phentermine, and she will try with a GLP-1.  3. BMI 28.0-28.9,adult  4. Obesity, Beginning BMI 32.1 Patient agreed to start Zepbound vial 2.5 mg once weekly with no refills.  Risks versus benefits were discussed with the patient.  Patient is to call us if she has any questions about how to dose herself.  - tirzepatide (ZEPBOUND) 2.5 MG/0.5ML injection vial; Inject 2.5 mg into the skin once a week.  Dispense: 2 mL; Refill: 0  Molly Giles is currently in the action stage of change. As such, her goal is to continue with weight loss efforts. She has agreed to the  Category 1 Plan.   Exercise goals: All adults should avoid inactivity. Some physical activity is better than none, and adults who participate in any amount of physical activity gain some health benefits.  Behavioral modification strategies: increasing lean protein intake and no skipping meals.  Molly Giles has agreed to follow-up with our clinic in 4 weeks. She was informed of the importance of frequent follow-up visits to maximize her success with intensive lifestyle modifications for her multiple health conditions.   Objective:   Blood pressure 112/79, pulse 81, temperature 98.9 F (37.2 C), height 5\' 6"  (1.676 m), weight 176 lb (79.8 kg), SpO2 98%. Body mass index is 28.41 kg/m.  Lab Results  Component Value Date   CREATININE 0.88 12/11/2022   BUN 16 12/11/2022   NA 138 12/11/2022   K 4.0 12/11/2022   CL 103 12/11/2022   CO2 19 (L) 12/11/2022   Lab Results  Component Value Date   ALT 20 12/11/2022   AST 19 12/11/2022   ALKPHOS 81 12/11/2022   BILITOT 0.3 12/11/2022   Lab Results  Component Value Date   HGBA1C 5.3 12/11/2022   HGBA1C 5.2 06/26/2019   HGBA1C 5.0 02/07/2018   HGBA1C 5.2 07/04/2017   HGBA1C 5.3 03/22/2017   Lab Results  Component Value Date   INSULIN 7.8 12/11/2022   INSULIN 8.3 05/07/2018   INSULIN 11.8 07/04/2017   INSULIN 10.5 03/22/2017   Lab Results  Component Value  Date   TSH 1.970 05/17/2023   Lab Results  Component Value Date   CHOL 176 12/11/2022   HDL 54 12/11/2022   LDLCALC 101 (H) 12/11/2022   TRIG 117 12/11/2022   CHOLHDL 3.3 10/11/2021   Lab Results  Component Value Date   VD25OH 41.2 12/11/2022   VD25OH 43.3 05/07/2018   VD25OH 48.5 07/04/2017   Lab Results  Component Value Date   WBC 6.4 08/04/2020   HGB 13.5 08/04/2020   HCT 41 08/04/2020   MCV 95.0 07/05/2019   PLT 262 08/04/2020   No results found for: "IRON", "TIBC", "FERRITIN"  Attestation Statements:   Reviewed by clinician on day of visit: allergies,  medications, problem list, medical history, surgical history, family history, social history, and previous encounter notes.   I, Burt Knack, am acting as transcriptionist for Quillian Quince, MD.  I have reviewed the above documentation for accuracy and completeness, and I agree with the above. -  Quillian Quince, MD

## 2023-06-14 ENCOUNTER — Other Ambulatory Visit (INDEPENDENT_AMBULATORY_CARE_PROVIDER_SITE_OTHER): Payer: Self-pay | Admitting: Family Medicine

## 2023-06-14 DIAGNOSIS — E559 Vitamin D deficiency, unspecified: Secondary | ICD-10-CM

## 2023-06-18 ENCOUNTER — Telehealth (INDEPENDENT_AMBULATORY_CARE_PROVIDER_SITE_OTHER): Payer: Self-pay | Admitting: Family Medicine

## 2023-06-18 NOTE — Telephone Encounter (Signed)
Patient called in stating she picked up the vials of Zepbound and states she is having difficulty getting all of the solution in the syringe. Would like someone to call her back. Phone number verified.

## 2023-06-19 ENCOUNTER — Ambulatory Visit: Payer: Medicare Other | Attending: Physician Assistant | Admitting: Rehabilitative and Restorative Service Providers"

## 2023-06-19 ENCOUNTER — Encounter: Payer: Self-pay | Admitting: Rehabilitative and Restorative Service Providers"

## 2023-06-19 ENCOUNTER — Encounter (INDEPENDENT_AMBULATORY_CARE_PROVIDER_SITE_OTHER): Payer: Self-pay | Admitting: Family Medicine

## 2023-06-19 DIAGNOSIS — H8112 Benign paroxysmal vertigo, left ear: Secondary | ICD-10-CM | POA: Insufficient documentation

## 2023-06-19 DIAGNOSIS — M542 Cervicalgia: Secondary | ICD-10-CM | POA: Insufficient documentation

## 2023-06-19 DIAGNOSIS — R42 Dizziness and giddiness: Secondary | ICD-10-CM | POA: Insufficient documentation

## 2023-06-19 NOTE — Telephone Encounter (Signed)
Please call patient and get more info. If she would like we can put her on the schedule at the end of they day and help her do her first injection.

## 2023-06-19 NOTE — Therapy (Signed)
OUTPATIENT PHYSICAL THERAPY VESTIBULAR TREATMENT     Patient Name: Molly Giles MRN: [id number] DOB:30-Sep-1955, 67 y.o., female Today's Date: 06/19/2023  END OF SESSION:  PT End of Session - 06/19/23 1201     Visit Number 5    Number of Visits 8    Date for PT Re-Evaluation 06/23/23    Authorization Type Medicare A and B    Progress Note Due on Visit 10    PT Start Time 1200    PT Stop Time 1238    PT Time Calculation (min) 38 min    Activity Tolerance Patient tolerated treatment well    Behavior During Therapy [facility] for tasks assessed/performed             Past Medical History:  Diagnosis Date   Abdominal bloating 05/10/2021   Abdominal discomfort, generalized 05/07/2016   Acute gastritis 11/19/2020   Allergic rhinoconjunctivitis 07/02/2015   Anal fissure 11/19/2020   Anterior to posterior tear of superior glenoid labrum of left shoulder 02/22/2021   Arthritis    Benign positional vertigo 04/2015   Responded well to Vestibular Rehab   Bruxism (teeth grinding)    Chronic contact dermatitis 08/06/2018   Per allergist Dr Edwyna Ready.    Chronic migraine 02/25/2016   Per Dr Lucia Gaskins review in notes from [facility] from September 2015. Showed total headache days last month 18. Severe headache days 7 days. Moderate headache days 5 days. Mild headache days last month sick days. Days without headache last month 10 days. Symptoms associated with photophobia, phonophobia, osmophobia, neck pain, dizziness, jaw pain, nasal congestion, vision disturbances, tingling and numbness, weakness and worsening with activity. Each headache attack last 3 hours depending on treatment in severity. Left side, the right side, easier side, the frontal area in the back of the head. Characterized as throbbing, pressure, tightness, squeezing, stabbing and burning    Chronic migraine without aura 05/15/2013    Dr Lucia Gaskins [facility]   Chronic tonsillitis 01/27/2019    DDD (degenerative disc disease), cervical 11/18/2018   DDD (degenerative disc disease), lumbar    Depression    Disc displacement, lumbar    Dysphagia 10/11/2021   Dysrhythmia    seen by dr Donnie Aho- not a problem since she has been on Bystolic   Episodic cluster headache, not intractable 03/06/2017   Essential hypertension 05/15/2013   Family history of adverse reaction to anesthesia    Brother- N/V   Family history of premature CAD 05/15/2013   Fibromyalgia syndrome 07/01/2015   Management by Dr Kathie Rhodes. Devashwar (Rheum)    GERD (gastroesophageal reflux disease) 12/16/2014   H/O seasonal allergies    Hallux rigidus of right foot 09/25/2022   Hammer toe    Left great toe   Hashimoto's thyroiditis    Per patient, diagnosed by Dr. Ferdinand Cava   Hearing loss of both ears 07/27/2015   mild to borderline moderate low frequency hearing loss improving to within normal limits bilaterally on audiology testing at [facility] in November 2016.     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   Hyperlipidemia 1998   Hypokalemia due to excessive gastrointestinal loss of potassium 07/28/2019   Hypothyroidism  Iliopsoas bursitis of left hip 05/08/2022   Impairment of balance 02/2015   Consequent of postconcussive syndrome   Injury of triangular fibrocartilage complex of left wrist 02/25/2019   Dx 02/25/19 Bradly Bienenstock IV MD ([facility])   Insulin resistance 07/04/2017   Internal hemorrhoid 01/28/2021   Internal hemorrhoid seen on colonoscopy 10/2020 Roderic Scarce MD [facility])   Interstitial cystitis    Irritable bowel syndrome with diarrhea 04/03/2016   Left ventricular hypertrophy, mild  02/25/2016   ECHOcardiogram report 06/17/15 showing EF55-60%, mild LVH and G1DD    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HCC) 03/10/2018   [facility]   Lumbar facet joint pain    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   Mood disorder (HCC)    Morbid obesity (HCC) 03/06/2017   Musculoskeletal neck pain 07/14/2015   Nocturnal hypoxemia 03/06/2017   Normal coronary arteries 05/14/2014   Obesity (BMI 30.0-34.9) 01/29/2017   Odynophagia 12/20/2021   Osteoarthritis of left hip 11/01/2015   MRI order by Dr Charlann Boxer (ortho) 10/2015 showed significant arthritis of left hip joint with cystic changes in femoral head c/w osteoarthritis   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, lumbar region 10/30/2011   Other insomnia 11/09/2016   Overweight    Pain in joint of left shoulder 11/09/2016   Pain in joint, multiple sites 11/18/2018   Palpitations 05/15/2013   [name] manages   Perianal candidiasis 12/20/2021   Periodic limb movement sleep disorder 03/28/2017   Perirectal cyst 05/07/2016   Poison ivy dermatitis 03/24/2021   PONV (postoperative nausea and vomiting)    Positive ANA (antinuclear antibody) 11/18/2018   Post concussion syndrome 06/06/2015   Post concussive syndrome 07/14/2015   Ms Spada's post-concussive syndrome manifesting in vertigo and headache, mood changes, poor balance, dizziness, and decreased concentration per Dr Lucia Gaskins at [facility].    Posterior vitreous detachment of right eye 2014   Premature menopause 12/20/2021   Rectal abscess 05/10/2021   S/P left THA, AA 07/25/2016   Sacroiliac inflammation (HCC) 08/18/2021   Shingles    Shortness of breath dyspnea    with exertion   Sicca syndrome (HCC) 11/18/2018   (+) ANA   Sleep walking and eating 03/06/2017   Snoring 03/06/2017   Spondylosis of lumbar region without myelopathy or radiculopathy 10/30/2011   TFC (triangular fibrocartilage complex) injury 02/25/2019   Thyroid nodule 08/11/2009    Findings: The thyroid gland is within normal limits in size.  The gland is diffusely inhomogeneous. A small solid nodule is noted in the lower pole  medially on the right of 7 x 6 x 8 mm. A small solid nodule is noted inferiorly on the left of 3 x 3 x 4 mm.  IMPRESSION:  The thyroid gland is within normal limits in size with only small solid nodules present, the largest of only 8 mm in diameter on the right.     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    Trochanteric bursitis, right hip 04/26/2020   Tubular adenoma of colon 01/28/2021   Colonoscopy screening 4 mm tubular Adenoma polyp Charlott Rakes, MD [facility])   Tubular adenoma of colon 01/28/2021   Colonoscopy screening 4 mm tubular Adenoma polyp Charlott Rakes, MD [facility])   Vasomotor symptoms due to menopause 04/19/2017   Vitamin D deficiency 05/08/2017   Vulvitis 07/22/2020   Yeast vaginitis 07/11/2019   Past Surgical History:  Procedure Laterality Date   arthroscopy Left  01/2022   with SAD and DCR, K. Supple MD   Bladder dilitation     x 3   BREAST BIOPSY Right 2011   Benign histology   CARDIOVASCULAR STRESS TEST  2000   Unremarkable per pt report   CARPOMETACARPAL JOINT ARTHROTOMY Right 2011   COLONOSCOPY     COLONOSCOPY WITH PROPOFOL N/A 04/21/2015   Procedure: COLONOSCOPY WITH PROPOFOL;  Surgeon: Jeani Hawking, MD;  Location: [facility] ENDOSCOPY;  Service: Endoscopy;  Laterality: N/A;   CYSTOSCOPY W/ DILATION OF BLADDER N/A    EPIDURAL BLOCK INJECTION Left 04/12/2016   Left Medial Nerve Block and Left L5 ramus block, Dr Sheran Luz    EPIDURAL BLOCK INJECTION  03/21/2016   Left L3-4 medial branch block and Left L5 & dorsal ramus block    EPIDURAL BLOCK INJECTION N/A 10/25/2016   Sheran Luz, MD. Lumbar medial branch block   EPIDURAL BLOCK INJECTION N/A 02/09/2017   Sheran Luz, MD.  Bilateral L3/4 medial branch block, bilateral L5 dorsal ramus block    EPIDURAL BLOCK INJECTION N/A 07/04/2017   Sheran Luz, MD   EXCISION MORTON'S NEUROMA Right 05/03/2023   Procedure: EXCISION MORTON'S NEUROMA;  Surgeon: Toni Arthurs, MD;  Location: [facility];  Service: Orthopedics;  Laterality: Right;  general, local by surgeon 60 MIN   FECAL TRANSPLANT  04/21/2015   Procedure: FECAL TRANSPLANT;  Surgeon: Jeani Hawking, MD;  Location: [facility] ENDOSCOPY;  Service: Endoscopy;;   HIP ARTHROPLASTY Left    HIP ARTHROSCOPY Left 03/06/2018   Left hip arthroplasty, redo for loose hip arthroplasty. Procedure at [facility] hospital   INJECTION HIP INTRA ARTICULAR Left 11/2015   for OA by Dr Vilma Prader IMPLANT PLACEMENT  04/2021   Floyde Parkins MD ([facility])   INTERSTIM IMPLANT REVISION N/A 09/2021   Floyde Parkins MD ([facility])   LIGAMENT REPAIR Right 05/03/2023   Procedure: COLLATERAL LIGAMENT REPAIR;  Surgeon: Toni Arthurs, MD;  Location: [facility];  Service: Orthopedics;  Laterality: Right;  general, local by surgeon 60 MIN   OTHER SURGICAL HISTORY Left 2016   Left L3/L4 medial nerve block and Left L5 Dorsal Ramus block Dr Rosine Abe   TOTAL HIP ARTHROPLASTY Left 07/25/2016   Procedure: LEFT TOTAL HIP ARTHROPLASTY ANTERIOR APPROACH;  Surgeon: Durene Romans, MD;  Location: [facility];  Service: Orthopedics;  Laterality: Left;   WEIL OSTEOTOMY Right 05/03/2023   Procedure: RIGHT 2ND WEIL OSTEOTOMY;  Surgeon: Toni Arthurs, MD;  Location: [facility];  Service: Orthopedics;  Laterality: Right;  general, local by surgeon 60 MIN   Patient Active Problem List   Diagnosis Date Noted   Vertigo 03/19/2023   Shingles 03/19/2023   Polyphagia 03/07/2023   Rash 02/07/2023   BMI 28.0-28.9,adult 02/07/2023   Low serum vitamin B12 01/10/2023   OSA (obstructive sleep apnea) 12/27/2022   BMI 30.0-30.9,adult 12/27/2022   Severe obstructive sleep apnea-hypopnea syndrome 12/22/2022   Hyperglycemia  12/11/2022   Depression screening 12/11/2022   Generalized obesity 12/11/2022   SOBOE (shortness of breath on exertion) 12/11/2022   Other fatigue 12/11/2022   Sleep related headaches 11/07/2022   Intractable episodic cluster headache 11/07/2022   Sleep walking and eating 11/07/2022   Ambien use disorder, 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   Psychophysiological insomnia 11/07/2022   Rhinitis 11/03/2022   Hallux rigidus of right foot 09/25/2022   Morton neuroma, right 09/25/2022   Chronic pain of right knee 05/08/2022   Arthrosis of  hand 12/20/2021   History of Clostridioides difficile colitis, required fecal transplantation 12/20/2021   Mixed hyperlipidemia 12/20/2021   Meibomian gland dysfunction (MGD) of both eyes 10/17/2021   Nuclear sclerosis of both eyes 10/17/2021   Epiretinal membrane (ERM) of left eye 10/17/2021   OAB (overactive bladder) 03/02/2021   Osteoarthritis of acromioclavicular joint 02/22/2021   Hyperhidrosis, scalp, 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   Chronic low back pain 05/14/2019   Cervical spondylosis 11/18/2018   DDD (degenerative disc disease), lumbar 11/18/2018   Keratoconjunctivitis sicca 11/18/2018   Chronic contact dermatitis 08/06/2018   Primary osteoarthritis of left knee 06/20/2018   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HCC) 11/29/2017   Insulin resistance 07/04/2017   Vitamin D deficiency 05/08/2017   Periodic limb movement sleep disorder 03/28/2017   Obesity (BMI 30.0-34.9) 01/29/2017   Other insomnia 11/09/2016   Irritable bowel syndrome with diarrhea 04/03/2016   Left ventricular hypertrophy, mild 02/25/2016   Mood disorder (HCC) 07/02/2015   Allergic rhinoconjunctivitis 07/02/2015   Fibromyalgia syndrome 07/01/2015   History of colonic polyps 07/01/2015   GERD (gastroesophageal reflux disease) 12/16/2014   Hypothyroidism 05/15/2013   Pure hypercholesterolemia 05/15/2013   Chronic interstitial cystitis 05/15/2013   Chronic migraine without aura 05/15/2013    PCP:  Tawanna Cooler McDiarmid, MD REFERRING PROVIDER: Caro Laroche, DO   REFERRING DIAG: R42 (ICD-10-CM) - Vertigo   THERAPY DIAG:  Dizziness and giddiness  BPPV (benign paroxysmal positional vertigo), left  Cervicalgia  ONSET DATE: 03/19/23  Rationale for Evaluation and Treatment: Rehabilitation  SUBJECTIVE:   SUBJECTIVE STATEMENT: The patient gets whole back soreness with row with band. She has had a HA 4 days this past week. Hair products contribute (the smell) to HA.  Dizziness remains resolved.  EVAL:The patient had a sudden onset of dizziness in July when at [facility] She was changing in a dressing room and stood up quickly with room spinning. The initial phase of vertigo lasted x days and she had to cancel a trip. She also hurt her neck and shoulder during that time. She notes dizziness with rolling to the left that lasts for a few seconds.  Pt accompanied by: self PERTINENT HISTORY: Migraines PAIN:  Are you having pain? Yes: NPRS scale: 4/10 Pain location: neck Pain description: soreness Aggravating factors: turn to the R-- gets L pain Relieving factors: head at neutral PRECAUTIONS: None WEIGHT BEARING RESTRICTIONS: No FALLS: Has patient fallen in last 6 months? No LIVING ENVIRONMENT: Lives with: lives with their spouse Lives in: House/apartment PLOF: Independent PATIENT GOALS: reduce dizziness  OBJECTIVE:  Cervical ROM:   Active A/PROM (deg) eval  Flexion WFLs  Extension WFLs  Right lateral flexion WFLs  Left lateral flexion WFLs  Right rotation WFLs  Left rotation WFLs  (Blank rows = not tested) PATIENT SURVEYS:  Was not captured at eval for FOTO  VESTIBULAR ASSESSMENT: (Measures in this section from initial evaluation unless otherwise noted) GENERAL OBSERVATION: The patient ambulates into clinic independently.    SYMPTOM BEHAVIOR:  Subjective history: Sudden onset of dizziness, has h/o dizziness x years  Non-Vestibular symptoms: migraine symptoms  Type of  dizziness: Spinning/Vertigo  Frequency: daily  Duration: seconds at this time (was minutes at onset)  Aggravating factors:  rolling to the left  Relieving factors: head stationary  Progression of symptoms: better  OCULOMOTOR EXAM:  Ocular Alignment: normal  Ocular ROM: No Limitations  Spontaneous Nystagmus: absent  Gaze-Induced Nystagmus: absent  Smooth Pursuits: intact  Saccades: intact   VESTIBULAR - OCULAR REFLEX:   Slow VOR:  Normal  Head-Impulse Test: HIT Right: negative HIT Left: negative  POSITIONAL TESTING: Right Dix-Hallpike: no nystagmus and sensation of fogginess worsens Left Dix-Hallpike: no nystagmus and return to sitting provokes an unsteadiness and patient needs to rest Right Roll Test: no nystagmus Left Roll Test: note a trace of upbeat, rotary nystagmus  [facility] Adult PT Treatment:                                                DATE: 06/19/23 Therapeutic Exercise: Standing Overhead press x 10 reps 2# bilat Single arm press x R and L x 10 reps 2# Shoulder ER with red band x 15 reps  D/c'd rowing from HEP with band due to pain Bent over rows x 2# x 10 reps Manual Therapy: STM bilat suboccipitals and bilat scalenes, upper trap, cervical multifidi Trigger Point Dry-Needling  Treatment instructions: Expect mild to moderate muscle soreness. Patient verbalized understanding of these instructions and education. Patient Consent Given: Yes Education handout provided: Previously provided Muscles treated: bilat suboccipitals, bilat upper traps Treatment response/outcome: palpable lengthening Self Care: Discussed gym routine and return to PT after 2-3 weeks to allow time to start routine.   [facility] Adult PT Treatment:                                                DATE: 06/07/23 Therapeutic Exercise: Standing Row x red band x 12 reps Overhead press 2# x 15 reps *provided handouts for HEP Manual Therapy: STM scalenes, suboccipitals, rhomboid, latissimus  dorsi Neuromuscular re-ed: Habituation Brandt daroff x 2 re[s Rolling R<>L x 3 reps Notes a sensation of head/eyes needing to catch up with movement Self Care: Discussed gym routine and provided information on HEP to begin seated row, lat pull and overhead press as she returns to gym routine  [facility] Adult PT Treatment:                                                DATE: 05/31/23 Therapeutic Exercise: Seated Levator stretch Upper trap stretch SCM stretch Supine Scalene stretch-- see HEP Sidelying Open book thoracic mobility Prone Shoulder extension with scapular depression Manual Therapy: STM upper trap, bilat scalenes, upper thoracic spine, cervical multifidi Trigger Point Dry-Needling  Treatment instructions: Expect mild to moderate muscle soreness. Drink plenty of fluids and use heat/ice if sore Patient Consent Given: Yes Education handout provided: Previously provided Muscles treated: upper trap and cervical multifidi Treatment response/outcome: palpable lenthening Neuromuscular re-ed: Positional testing Brandt daroff x 1 reps Horizontal rolling x 3 reps Habituation Horizontal rolling-- provokes a level of fogginess   [facility] Adult PT Treatment:                                                DATE: 05/02/23 Therapeutic Exercise: Prone Scap retraction and depression Sidelying  Open book Seated AROM sidebending neck Levator SCM stretch Manual Therapy: STM bilat suboccipitals, bilat upper trap Trigger Point Dry-Needling  Treatment instructions: Expect  mild to moderate muscle soreness. S/S of pneumothorax if dry needled over a lung field, and to seek immediate medical attention should they occur. Patient verbalized understanding of these instructions and education. Patient Consent Given: Yes Education handout provided: Yes Muscles treated: bilat upper trap Treatment response/outcome:  muscle twitch and palpable lengthening, has continued "foggy" feeling in head, no HA  after DN-- notes overall improvement Neuromuscular re-ed: Positional testing L sidelying= feels internal dizziness, no room spinning-- also feels dizzy with return to sitting R sidelying=no symptoms, no nystagmus L dix hallpike=sensation of dizziness with no nystagmus viewed in room light R dix hallpike=none Bilat horizontal roll= none Habituation Austin Miles for HEP   [facility] Adult PT Treatment:                                                DATE: 04/24/23  Canalith Repositioning:  Epley Left: Number of Reps: 2 and Comment: symptoms improved second rep  PATIENT EDUCATION: Education details: nature of condition, plan for treatment Person educated: Patient Education method: Explanation Education comprehension: verbalized understanding  HOME EXERCISE PROGRAM: Access Code: [email or web address] URL: [email or web address] Date: 06/07/2023 Prepared by: Margretta Ditty  Program Notes GYM EXERCISES TO CONSIDER:seated row machine, lat pulls, overhead press with dumb bells  Exercises - Right Sidelying to Left Sidelying Vestibular Habituation  - 2 x daily - 7 x weekly - 1 sets - 5 reps - Brandt-Daroff Vestibular Exercise  - 2 x daily - 7 x weekly - 1 sets - 5 reps - Prone Scapular Slide with Shoulder Extension  - 1 x daily - 7 x weekly - 1 sets - 10 reps - Sidelying Thoracic Rotation with Open Book  - 1 x daily - 7 x weekly - 1 sets - 10 reps - Sternocleidomastoid Stretch  - 1 x daily - 7 x weekly - 1 sets - 3 reps - 30 seconds hold - Seated Levator Scapulae Stretch  - 1 x daily - 7 x weekly - 1 sets - 3 reps - 30 seconds hold - Seated Cervical Sidebending Stretch  - 1 x daily - 7 x weekly - 1 sets - 3 reps - 30 seconds hold - Supine Anterior Scalene Stretch  - 2 x daily - 7 x weekly - 1 sets - 3 reps - 30 seconds hold - Standing Row with Anchored Resistance  - 1 x daily - 3 x weekly - 2 sets - 15 reps - Standing Single Arm Shoulder Press with Dumbbell  - 1 x daily - 3 x weekly - 2  sets - 15 reps  GOALS: Goals reviewed with patient? Yes  SHORT TERM GOALS: Target date: 05/24/23  The patient will be indep with habituation HEP. Baseline: to provide HEP Goal status:MET   LONG TERM GOALS: Target date: 06/23/23  The patient will report bed mobility without episodes of spinning. Baseline:  Spinning with L roll Goal status: MET  2.  The patient will have negative positional testing. Baseline:  + L horizontal roll and noted nystagmus when entering into 1st position with epley's Goal status: MET  3.  The patient will verbalize understanding of self mgmt of symptoms.  Baseline:  to review home treatment for future mgmt Goal status: INITIAL   ASSESSMENT: CLINICAL IMPRESSION: The patient continues with regular headaches. PT is treating muscle tightness via STM and also encouraging progressive strength  training. PT to follow up and renew next session for main goal of allowing patient time to continue to develop HEP and trial gym routine. Will adjust exercises as needed.   EVAL: Patient is a 67 y.o. female who was seen today for physical therapy evaluation and treatment for vertigo. She is known to me from prior treatment for BPPV and vestibular issues. She presents today with + L BPPV initially noted with rotary nystagmus with L horizontal roll. She responded well to L epley's x 2 reps today. She also does get imbalance and HA at times-- PT to monitor and adjust treatment as needed.    OBJECTIVE IMPAIRMENTS: decreased balance and dizziness.   PLAN:  PT FREQUENCY: 1x/week  PT DURATION: 8 weeks  PLANNED INTERVENTIONS: Therapeutic exercises, Therapeutic activity, Neuromuscular re-education, Balance training, Gait training, Patient/Family education, Self Care, Joint mobilization, Vestibular training, Canalith repositioning, Dry Needling, and Manual therapy  PLAN FOR NEXT SESSION:  check on how gym routine is going, Folllow up on neck and HA mgmt. Check LTGs and renew as  needed.    Larhonda Dettloff, PT 06/19/2023, 12:02 PM

## 2023-06-20 ENCOUNTER — Ambulatory Visit (INDEPENDENT_AMBULATORY_CARE_PROVIDER_SITE_OTHER): Payer: Medicare Other | Admitting: Family Medicine

## 2023-06-20 ENCOUNTER — Encounter (INDEPENDENT_AMBULATORY_CARE_PROVIDER_SITE_OTHER): Payer: Self-pay | Admitting: Family Medicine

## 2023-06-20 VITALS — BP 115/85 | HR 90 | Temp 97.9°F | Ht 66.0 in | Wt 175.0 lb

## 2023-06-20 DIAGNOSIS — E669 Obesity, unspecified: Secondary | ICD-10-CM

## 2023-06-20 DIAGNOSIS — R632 Polyphagia: Secondary | ICD-10-CM

## 2023-06-20 DIAGNOSIS — Z6828 Body mass index (BMI) 28.0-28.9, adult: Secondary | ICD-10-CM

## 2023-06-20 NOTE — Progress Notes (Unsigned)
Chief Complaint:   OBESITY Molly Giles is here to discuss her progress with her obesity treatment plan along with follow-up of her obesity related diagnoses. Molly Giles is on the Category 1 Plan and states she is following her eating plan approximately (unknown)% of the time. [name] states she is doing 0 minutes 0 times per week.  Today's visit was #: 12 Starting weight: 199 lbs Starting date: 12/11/2022 Today's weight: 175 lbs Today's date: 06/20/2023 Total lbs lost to date: 24 Total lbs lost since last in-office visit: 1  Interim History: Patient is here today for medication management.  She has questions about how to take her GLP-1.  Subjective:   1. Polyphagia Patient has Zepbound vials via [employer] and she has questions about how to take it.   Assessment/Plan:   1. Polyphagia Patient was shown how to draw up Zepbound and inject.  She has no problems and all of her questions were answered.  2. BMI 28.0-28.9,adult  3. Obesity, Beginning BMI 32.1 Molly Giles is currently in the action stage of change. As such, her goal is to continue with weight loss efforts. She has agreed to the Category 1 Plan.   Patient was encouraged to continue eating all of the food on her plan to minimize hair loss and excessive skin laxity.  Behavioral modification strategies: no skipping meals.  Molly Giles has agreed to follow-up with our clinic in 3 weeks. She was informed of the importance of frequent follow-up visits to maximize her success with intensive lifestyle modifications for her multiple health conditions.   Objective:   Blood pressure 115/85, pulse 90, temperature 97.9 F (36.6 C), height 5\' 6"  (1.676 m), weight 175 lb (79.4 kg), SpO2 98%. Body mass index is 28.25 kg/m.  Lab Results  Component Value Date   CREATININE 0.88 12/11/2022   BUN 16 12/11/2022   NA 138 12/11/2022   K 4.0 12/11/2022   CL 103 12/11/2022   CO2 19 (L) 12/11/2022   Lab Results  Component Value Date   ALT 20  12/11/2022   AST 19 12/11/2022   ALKPHOS 81 12/11/2022   BILITOT 0.3 12/11/2022   Lab Results  Component Value Date   HGBA1C 5.3 12/11/2022   HGBA1C 5.2 06/26/2019   HGBA1C 5.0 02/07/2018   HGBA1C 5.2 07/04/2017   HGBA1C 5.3 03/22/2017   Lab Results  Component Value Date   INSULIN 7.8 12/11/2022   INSULIN 8.3 05/07/2018   INSULIN 11.8 07/04/2017   INSULIN 10.5 03/22/2017   Lab Results  Component Value Date   TSH 1.970 05/17/2023   Lab Results  Component Value Date   CHOL 176 12/11/2022   HDL 54 12/11/2022   LDLCALC 101 (H) 12/11/2022   TRIG 117 12/11/2022   CHOLHDL 3.3 10/11/2021   Lab Results  Component Value Date   VD25OH 41.2 12/11/2022   VD25OH 43.3 05/07/2018   VD25OH 48.5 07/04/2017   Lab Results  Component Value Date   WBC 6.4 08/04/2020   HGB 13.5 08/04/2020   HCT 41 08/04/2020   MCV 95.0 07/05/2019   PLT 262 08/04/2020   No results found for: "IRON", "TIBC", "FERRITIN"  Attestation Statements:   Reviewed by clinician on day of visit: allergies, medications, problem list, medical history, surgical history, family history, social history, and previous encounter notes.   I, Burt Knack, am acting as transcriptionist for Quillian Quince, MD.  I have reviewed the above documentation for accuracy and completeness, and I agree with the above. -  Quillian Quince, MD

## 2023-06-21 NOTE — Telephone Encounter (Signed)
Patient was seen in clinic, please see previous note as multiple encounters were created.

## 2023-06-25 ENCOUNTER — Telehealth: Payer: Self-pay | Admitting: Neurology

## 2023-06-25 DIAGNOSIS — G4733 Obstructive sleep apnea (adult) (pediatric): Secondary | ICD-10-CM

## 2023-06-25 MED ORDER — RIZATRIPTAN BENZOATE 10 MG PO TABS: 10.0000 mg | ORAL_TABLET | ORAL | 3 refills | Status: DC | PRN

## 2023-06-25 NOTE — Telephone Encounter (Signed)
Last saw AL,NP 09/27/22 and next f/u with Amy 09/27/23 for migraine management. Last refill for rizatriptan sent 03/06/23 #10, 2 refills. Due for refill. I e-scribed to pharmacy.  Sees Dr. Vickey Huger for sleep related issues. Had f/u for this with SS,NP last on 03/28/23.  I called [facility] at [phone]. Spoke w/ Tammy. States she had to do a restart.  Needs restart order. I placed in epic and sent community message to [facility] that order placed.  I called pt and LVM providing update. Asked her to call back if anything else is needed.

## 2023-06-25 NOTE — Telephone Encounter (Signed)
Pt called and LVM stating that she is completely out of her rizatriptan (MAXALT) 10 MG tablet and the pharmacy is still waiting on the ok to refill. She also would like to speak to the RN regarding a call she received from [facility] stating that they are needing some information that they are missing. Please advise.

## 2023-06-26 DIAGNOSIS — M7062 Trochanteric bursitis, left hip: Secondary | ICD-10-CM | POA: Insufficient documentation

## 2023-06-30 ENCOUNTER — Other Ambulatory Visit: Payer: Self-pay | Admitting: Family Medicine

## 2023-07-02 ENCOUNTER — Encounter (INDEPENDENT_AMBULATORY_CARE_PROVIDER_SITE_OTHER): Payer: Self-pay | Admitting: Family Medicine

## 2023-07-05 ENCOUNTER — Encounter: Payer: Self-pay | Admitting: Rehabilitative and Restorative Service Providers"

## 2023-07-05 ENCOUNTER — Ambulatory Visit: Payer: Medicare Other | Admitting: Rehabilitative and Restorative Service Providers"

## 2023-07-05 DIAGNOSIS — R42 Dizziness and giddiness: Secondary | ICD-10-CM

## 2023-07-05 DIAGNOSIS — H8112 Benign paroxysmal vertigo, left ear: Secondary | ICD-10-CM

## 2023-07-05 DIAGNOSIS — M542 Cervicalgia: Secondary | ICD-10-CM

## 2023-07-05 NOTE — Therapy (Signed)
OUTPATIENT PHYSICAL THERAPY VESTIBULAR TREATMENT     Patient Name: Molly Giles MRN: [id number] DOB:[birth year], 67 y.o., female Today's Date: 07/05/2023  END OF SESSION:  PT End of Session - 07/05/23 1152     Visit Number 6    Number of Visits 8    Date for PT Re-Evaluation 06/23/23    Authorization Type Medicare A and B    Progress Note Due on Visit 10    PT Start Time 1150    PT Stop Time 1230    PT Time Calculation (min) 40 min    Activity Tolerance Patient tolerated treatment well    Behavior During Therapy [facility] for tasks assessed/performed             Past Medical History:  Diagnosis Date   Abdominal bloating 05/10/2021   Abdominal discomfort, generalized 05/07/2016   Acute gastritis 11/19/2020   Allergic rhinoconjunctivitis 07/02/2015   Anal fissure 11/19/2020   Anterior to posterior tear of superior glenoid labrum of left shoulder 02/22/2021   Arthritis    Benign positional vertigo 04/2015   Responded well to Vestibular Rehab   Bruxism (teeth grinding)    Chronic contact dermatitis 08/06/2018   Per allergist Dr Edwyna Ready.    Chronic migraine 02/25/2016   Per Dr Lucia Gaskins review in notes from [facility] from September 2015. Showed total headache days last month 18. Severe headache days 7 days. Moderate headache days 5 days. Mild headache days last month sick days. Days without headache last month 10 days. Symptoms associated with photophobia, phonophobia, osmophobia, neck pain, dizziness, jaw pain, nasal congestion, vision disturbances, tingling and numbness, weakness and worsening with activity. Each headache attack last 3 hours depending on treatment in severity. Left side, the right side, easier side, the frontal area in the back of the head. Characterized as throbbing, pressure, tightness, squeezing, stabbing and burning    Chronic migraine without aura 05/15/2013    Dr Lucia Gaskins [facility]   Chronic tonsillitis  01/27/2019   DDD (degenerative disc disease), cervical 11/18/2018   DDD (degenerative disc disease), lumbar    Depression    Disc displacement, lumbar    Dysphagia 10/11/2021   Dysrhythmia    seen by dr Donnie Aho- not a problem since she has been on Bystolic   Episodic cluster headache, not intractable 03/06/2017   Essential hypertension 05/15/2013   Family history of adverse reaction to anesthesia    Brother- N/V   Family history of premature CAD 05/15/2013   Fibromyalgia syndrome 07/01/2015   Management by Dr Kathie Rhodes. Devashwar (Rheum)    GERD (gastroesophageal reflux disease) 12/16/2014   H/O seasonal allergies    Hallux rigidus of right foot 09/25/2022   Hammer toe    Left great toe   Hashimoto's thyroiditis    Per patient, diagnosed by Dr. Ferdinand Cava   Hearing loss of both ears 07/27/2015   mild to borderline moderate low frequency hearing loss improving to within normal limits bilaterally on audiology testing at [facility] in November 2016.     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   Hyperlipidemia 1998   Hypokalemia due to excessive gastrointestinal loss of potassium 07/28/2019   Hypothyroidism  Iliopsoas bursitis of left hip 05/08/2022   Impairment of balance 02/2015   Consequent of postconcussive syndrome   Injury of triangular fibrocartilage complex of left wrist 02/25/2019   Dx 02/25/19 Bradly Bienenstock IV MD ([facility])   Insulin resistance 07/04/2017   Internal hemorrhoid 01/28/2021   Internal hemorrhoid seen on colonoscopy 10/2020 Roderic Scarce MD [facility])   Interstitial cystitis    Irritable bowel syndrome with diarrhea 04/03/2016   Left ventricular  hypertrophy, mild 02/25/2016   ECHOcardiogram report 06/17/15 showing EF55-60%, mild LVH and G1DD    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HCC) 03/10/2018   [facility]   Lumbar facet joint pain    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   Mood disorder (HCC)    Morbid obesity (HCC) 03/06/2017   Musculoskeletal neck pain 07/14/2015   Nocturnal hypoxemia 03/06/2017   Normal coronary arteries 05/14/2014   Obesity (BMI 30.0-34.9) 01/29/2017   Odynophagia 12/20/2021   Osteoarthritis of left hip 11/01/2015   MRI order by Dr Charlann Boxer (ortho) 10/2015 showed significant arthritis of left hip joint with cystic changes in femoral head c/w osteoarthritis   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, lumbar region 10/30/2011   Other insomnia 11/09/2016   Overweight    Pain in joint of left shoulder 11/09/2016   Pain in joint, multiple sites 11/18/2018   Palpitations 05/15/2013   [facility] manages   Perianal candidiasis 12/20/2021   Periodic limb movement sleep disorder 03/28/2017   Perirectal cyst 05/07/2016   Poison ivy dermatitis 03/24/2021   PONV (postoperative nausea and vomiting)    Positive ANA (antinuclear antibody) 11/18/2018   Post concussion syndrome 06/06/2015   Post concussive syndrome 07/14/2015   Ms Heimsoth's post-concussive syndrome manifesting in vertigo and headache, mood changes, poor balance, dizziness, and decreased concentration per Dr Lucia Gaskins at [facility].    Posterior vitreous detachment of right eye 2014   Premature menopause 12/20/2021   Rectal abscess 05/10/2021   S/P left THA, AA 07/25/2016   Sacroiliac inflammation (HCC) 08/18/2021   Shingles    Shortness of breath dyspnea    with exertion   Sicca syndrome (HCC) 11/18/2018   (+) ANA   Sleep walking and eating 03/06/2017   Snoring 03/06/2017   Spondylosis of lumbar region without myelopathy or radiculopathy 10/30/2011   TFC (triangular fibrocartilage complex) injury 02/25/2019   Thyroid  nodule 08/11/2009   Findings: The thyroid gland is within normal limits in size.  The gland is diffusely inhomogeneous. A small solid nodule is noted in the lower pole  medially on the right of 7 x 6 x 8 mm. A small solid nodule is noted inferiorly on the left of 3 x 3 x 4 mm.  IMPRESSION:  The thyroid gland is within normal limits in size with only small solid nodules present, the largest of only 8 mm in diameter on the right.     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    Trochanteric bursitis, right hip 04/26/2020   Tubular adenoma of colon 01/28/2021   Colonoscopy screening 4 mm tubular Adenoma polyp Charlott Rakes, MD [facility])   Tubular adenoma of colon 01/28/2021   Colonoscopy screening 4 mm tubular Adenoma polyp Charlott Rakes, MD [facility])   Vasomotor symptoms due to menopause 04/19/2017   Vitamin D deficiency 05/08/2017   Vulvitis 07/22/2020   Yeast vaginitis 07/11/2019   Past Surgical History:  Procedure Laterality Date   arthroscopy Left  01/2022   with SAD and DCR, K. Supple MD   Bladder dilitation     x 3   BREAST BIOPSY Right 2011   Benign histology   CARDIOVASCULAR STRESS TEST  2000   Unremarkable per pt report   CARPOMETACARPAL JOINT ARTHROTOMY Right 2011   COLONOSCOPY     COLONOSCOPY WITH PROPOFOL N/A 04/21/2015   Procedure: COLONOSCOPY WITH PROPOFOL;  Surgeon: Jeani Hawking, MD;  Location: [facility] ENDOSCOPY;  Service: Endoscopy;  Laterality: N/A;   CYSTOSCOPY W/ DILATION OF BLADDER N/A    EPIDURAL BLOCK INJECTION Left 04/12/2016   Left Medial Nerve Block and Left L5 ramus block, Dr Sheran Luz    EPIDURAL BLOCK INJECTION  03/21/2016   Left L3-4 medial branch block and Left L5 & dorsal ramus block    EPIDURAL BLOCK INJECTION N/A 10/25/2016   Sheran Luz, MD. Lumbar medial branch block   EPIDURAL BLOCK INJECTION N/A 02/09/2017   Sheran Luz, MD.  Bilateral L3/4 medial branch block, bilateral L5  dorsal ramus block   EPIDURAL BLOCK INJECTION N/A 07/04/2017   Sheran Luz, MD   EXCISION MORTON'S NEUROMA Right 05/03/2023   Procedure: EXCISION MORTON'S NEUROMA;  Surgeon: Toni Arthurs, MD;  Location: [facility];  Service: Orthopedics;  Laterality: Right;  general, local by surgeon 60 MIN   FECAL TRANSPLANT  04/21/2015   Procedure: FECAL TRANSPLANT;  Surgeon: Jeani Hawking, MD;  Location: [facility] END[facility]COPY;  Service: Endoscopy;;   HIP ARTHROPLASTY Left    HIP ARTHROSCOPY Left 03/06/2018   Left hip arthroplasty, redo for loose hip arthroplasty. Procedure at [facility]   INJECTION HIP INTRA ARTICULAR Left 11/2015   for OA by Dr Vilma Prader IMPLANT PLACEMENT  04/2021   Floyde Parkins MD ([facility])   INTERSTIM IMPLANT REVISION N/A 09/2021   Floyde Parkins MD ([facility])   LIGAMENT REPAIR Right 05/03/2023   Procedure: COLLATERAL LIGAMENT REPAIR;  Surgeon: Toni Arthurs, MD;  Location: [facility];  Service: Orthopedics;  Laterality: Right;  general, local by surgeon 60 MIN   OTHER SURGICAL HISTORY Left 2016   Left L3/L4 medial nerve block and Left L5 Dorsal Ramus block Dr Rosine Abe   TOTAL HIP ARTHROPLASTY Left 07/25/2016   Procedure: LEFT TOTAL HIP ARTHROPLASTY ANTERIOR APPROACH;  Surgeon: Durene Romans, MD;  Location: [facility];  Service: Orthopedics;  Laterality: Left;   WEIL OSTEOTOMY Right 05/03/2023   Procedure: RIGHT 2ND WEIL OSTEOTOMY;  Surgeon: Toni Arthurs, MD;  Location: [facility];  Service: Orthopedics;  Laterality: Right;  general, local by surgeon 60 MIN   Patient Active Problem List   Diagnosis Date Noted   Vertigo 03/19/2023   Shingles 03/19/2023   Polyphagia 03/07/2023   Rash 02/07/2023   BMI 28.0-28.9,adult 02/07/2023   Low serum vitamin B12 01/10/2023   OSA (obstructive sleep apnea) 12/27/2022   BMI 30.0-30.9,adult 12/27/2022   Severe obstructive sleep apnea-hypopnea syndrome 12/22/2022    Hyperglycemia 12/11/2022   Depression screening 12/11/2022   Generalized obesity 12/11/2022   SOBOE (shortness of breath on exertion) 12/11/2022   Other fatigue 12/11/2022   Sleep related headaches 11/07/2022   Intractable episodic cluster headache 11/07/2022   Sleep walking and eating 11/07/2022   Ambien use disorder, 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   Psychophysiological insomnia 11/07/2022   Rhinitis 11/03/2022   Hallux rigidus of right foot 09/25/2022   Morton neuroma, right 09/25/2022   Chronic pain of right knee 05/08/2022   Arthrosis of  hand 12/20/2021   History of Clostridioides difficile colitis, required fecal transplantation 12/20/2021   Mixed hyperlipidemia 12/20/2021   Meibomian gland dysfunction (MGD) of both eyes 10/17/2021   Nuclear sclerosis of both eyes 10/17/2021   Epiretinal membrane (ERM) of left eye 10/17/2021   OAB (overactive bladder) 03/02/2021   Osteoarthritis of acromioclavicular joint 02/22/2021   Hyperhidrosis, scalp, 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   Chronic low back pain 05/14/2019   Cervical spondylosis 11/18/2018   DDD (degenerative disc disease), lumbar 11/18/2018   Keratoconjunctivitis sicca 11/18/2018   Chronic contact dermatitis 08/06/2018   Primary osteoarthritis of left knee 06/20/2018   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HCC) 11/29/2017   Insulin resistance 07/04/2017   Vitamin D deficiency 05/08/2017   Periodic limb movement sleep disorder 03/28/2017   Obesity (BMI 30.0-34.9) 01/29/2017   Other insomnia 11/09/2016   Irritable bowel syndrome with diarrhea 04/03/2016   Left ventricular hypertrophy, mild 02/25/2016   Mood disorder (HCC) 07/02/2015   Allergic rhinoconjunctivitis 07/02/2015   Fibromyalgia syndrome 07/01/2015   History of colonic polyps 07/01/2015   GERD (gastroesophageal reflux disease) 12/16/2014   Hypothyroidism 05/15/2013   Pure hypercholesterolemia 05/15/2013   Chronic interstitial cystitis 05/15/2013   Chronic migraine without aura 05/15/2013     PCP: Tawanna Cooler McDiarmid, MD REFERRING PROVIDER: Caro Laroche, DO   REFERRING DIAG: R42 (ICD-10-CM) - Vertigo   THERAPY DIAG:  Dizziness and giddiness  BPPV (benign paroxysmal positional vertigo), left  Cervicalgia  ONSET DATE: 03/19/23  Rationale for Evaluation and Treatment: Rehabilitation  SUBJECTIVE:   SUBJECTIVE STATEMENT: The patient reports spinning sensation returning 06/25/23. She is noticing balance issues.  She also had a crown fall out of her mouth and this led to grinding in her jaw, neck pain, and headaches.   EVAL:The patient had a sudden onset of dizziness in July when at [facility]. She was changing in a dressing room and stood up quickly with room spinning. The initial phase of vertigo lasted x days and she had to cancel a trip. She also hurt her neck and shoulder during that time. She notes dizziness with rolling to the left that lasts for a few seconds.  Pt accompanied by: self PERTINENT HISTORY: Migraines PAIN:  Are you having pain? Yes: NPRS scale: 4/10 Pain location: neck Pain description: soreness Aggravating factors: turn to the R-- gets L pain Relieving factors: head at neutral PRECAUTIONS: None WEIGHT BEARING RESTRICTIONS: No FALLS: Has patient fallen in last 6 months? No LIVING ENVIRONMENT: Lives with: lives with their spouse Lives in: House/apartment PLOF: Independent PATIENT GOALS: reduce dizziness  OBJECTIVE:  Cervical ROM:   Active A/PROM (deg) eval  Flexion WFLs  Extension WFLs  Right lateral flexion WFLs  Left lateral flexion WFLs  Right rotation WFLs  Left rotation WFLs  (Blank rows = not tested) PATIENT SURVEYS:  Was not captured at eval for FOTO  VESTIBULAR ASSESSMENT: (Measures in this section from initial evaluation unless otherwise noted) GENERAL OBSERVATION: The patient ambulates into clinic independently.    SYMPTOM BEHAVIOR:  Subjective history: Sudden onset of dizziness, has h/o dizziness x years  Non-Vestibular  symptoms: migraine symptoms  Type of dizziness: Spinning/Vertigo  Frequency: daily  Duration: seconds at this time (was minutes at onset)  Aggravating factors:  rolling to the left  Relieving factors: head stationary  Progression of symptoms: better  OCULOMOTOR EXAM:  Ocular Alignment: normal  Ocular ROM: No Limitations  Spontaneous Nystagmus: absent  Gaze-Induced Nystagmus: absent  Smooth Pursuits: intact  Saccades: intact   VESTIBULAR - OCULAR  REFLEX:   Slow VOR: Normal  Head-Impulse Test: HIT Right: negative HIT Left: negative  POSITIONAL TESTING: Right Dix-Hallpike: no nystagmus and sensation of fogginess worsens Left Dix-Hallpike: no nystagmus and return to sitting provokes an unsteadiness and patient needs to rest Right Roll Test: no nystagmus Left Roll Test: note a trace of upbeat, rotary nystagmus   [facility] Adult PT Treatment:                                                DATE: 07/05/23 Neuromuscular re-ed: Positional testing L sidelying test + for upbeating, rotary nystagmus to the left lasting 10 second duration R sidelying test - for nystagmus Bilat horizontal roll test - for nystagmus Canolith repositioning L epley's x 2 reps, with symptoms resolved on 2nd rep Manual Therapy: STM bilat bilat scalenes, upper trap, cervical multifidi Trigger Point Dry-Needling  Treatment instructions: Expect mild to moderate muscle soreness. Patient verbalized understanding of these instructions and education. Patient Consent Given: Yes Education handout provided: Previously provided Muscles treated: bilat suboccipitals, bilat upper traps Treatment response/outcome: palpable lengthening Self Care: Self epley's handout  [facility] Adult PT Treatment:                                                DATE: 06/19/23 Therapeutic Exercise: Standing Overhead press x 10 reps 2# bilat Single arm press x R and L x 10 reps 2# Shoulder ER with red band x 15 reps  D/c'd rowing from HEP with band  due to pain Bent over rows x 2# x 10 reps Manual Therapy: STM bilat suboccipitals and bilat scalenes, upper trap, cervical multifidi Trigger Point Dry-Needling  Treatment instructions: Expect mild to moderate muscle soreness. Patient verbalized understanding of these instructions and education. Patient Consent Given: Yes Education handout provided: Previously provided Muscles treated: bilat suboccipitals, bilat upper traps Treatment response/outcome: palpable lengthening Self Care: Discussed gym routine and return to PT after 2-3 weeks to allow time to start routine.  PATIENT EDUCATION: Education details: nature of condition, plan for treatment Person educated: Patient Education method: Explanation Education comprehension: verbalized understanding  HOME EXERCISE PROGRAM: Access Code: [email or web address] URL: [email or web address] Date: 07/05/2023 Prepared by: Margretta Ditty  Program Notes [facility]  on you tube: Epley maneuver to treat BPPV (you would start on the left side) GYM EXERCISES TO CONSIDER:seated row machine, lat pulls, overhead press with dumb bells  Exercises - Right Sidelying to Left Sidelying Vestibular Habituation  - 2 x daily - 7 x weekly - 1 sets - 5 reps - Brandt-Daroff Vestibular Exercise  - 2 x daily - 7 x weekly - 1 sets - 5 reps - Prone Scapular Slide with Shoulder Extension  - 1 x daily - 7 x weekly - 1 sets - 10 reps - Sidelying Thoracic Rotation with Open Book  - 1 x daily - 7 x weekly - 1 sets - 10 reps - Sternocleidomastoid Stretch  - 1 x daily - 7 x weekly - 1 sets - 3 reps - 30 seconds hold - Seated Levator Scapulae Stretch  - 1 x daily - 7 x weekly - 1 sets - 3 reps - 30 seconds hold - Seated Cervical Sidebending Stretch  - 1 x daily -  7 x weekly - 1 sets - 3 reps - 30 seconds hold - Supine Anterior Scalene Stretch  - 2 x daily - 7 x weekly - 1 sets - 3 reps - 30 seconds hold - Standing Single Arm Shoulder Press with Dumbbell  - 1 x daily -  3 x weekly - 2 sets - 15 reps - Shoulder External Rotation and Scapular Retraction with Resistance  - 1 x daily - 7 x weekly - 1 sets - 10 reps  Patient Education - Left Epley Maneuver  GOALS: Goals reviewed with patient? Yes  SHORT TERM GOALS: Target date: 05/24/23  The patient will be indep with habituation HEP. Baseline: to provide HEP Goal status:MET   LONG TERM GOALS: Target date: 06/23/23  The patient will report bed mobility without episodes of spinning. Baseline:  Spinning with L roll Goal status: MET  2.  The patient will have negative positional testing. Baseline:  + L horizontal roll and noted nystagmus when entering into 1st position with epley's Goal status: MET  3.  The patient will verbalize understanding of self mgmt of symptoms.  Baseline:  to review home treatment for future mgmt Goal status: continue  UPDATED LONG TERM GOALS: SHORT TERM GOALS: Target date: 08/04/23  The patient will be indep with self management of vertigo (home epley's) due to recurring symptoms. Baseline: provided handout and instructions today Goal status: UPDATED to new target date  LONG TERM GOALS: Target date: 09/02/23  The patient will report bed mobility without episodes of spinning. Baseline:  L BPPV returned Goal status: UPDATED  2.  The patient will have negative positional testing. Baseline:  + L BPPV Goal status:UPDATED  3.  The patient will report 50% reduction in neck tightness.   Baseline:  to review home treatment for future mgmt Goal status: continue   ASSESSMENT: CLINICAL IMPRESSION: The patient returned today with L BPPV. We treated with canolith repositioning and also treated cervical tightness as headaches have been daily since dental issue arose. PT updated plan of care to continue with intermittent management of recurrent symptoms. We discussed self treatment today for ongoing mgmt of chronic BPPV. Patient also inquired about ENT due to auditory changes  (tinnitus in loud environments), and intermittent vertigo and imbalance.   EVAL: Patient is a 67 y.o. female who was seen today for physical therapy evaluation and treatment for vertigo. She is known to me from prior treatment for BPPV and vestibular issues. She presents today with + L BPPV initially noted with rotary nystagmus with L horizontal roll. She responded well to L epley's x 2 reps today. She also does get imbalance and HA at times-- PT to monitor and adjust treatment as needed.    OBJECTIVE IMPAIRMENTS: decreased balance and dizziness.   PLAN:  PT FREQUENCY: 1x/week  PT DURATION: 8 weeks  PLANNED INTERVENTIONS: Therapeutic exercises, Therapeutic activity, Neuromuscular re-education, Balance training, Gait training, Patient/Family education, Self Care, Joint mobilization, Vestibular training, Canalith repositioning, Dry Needling, and Manual therapy  PLAN FOR NEXT SESSION:  continue working to [employer] with focus on home management.   Jamiah Recore, PT 07/05/2023, 11:52 AM

## 2023-07-10 ENCOUNTER — Encounter (INDEPENDENT_AMBULATORY_CARE_PROVIDER_SITE_OTHER): Payer: Self-pay | Admitting: Family Medicine

## 2023-07-10 ENCOUNTER — Telehealth (INDEPENDENT_AMBULATORY_CARE_PROVIDER_SITE_OTHER): Payer: Self-pay | Admitting: Family Medicine

## 2023-07-10 ENCOUNTER — Ambulatory Visit (INDEPENDENT_AMBULATORY_CARE_PROVIDER_SITE_OTHER): Payer: Medicare Other | Admitting: Family Medicine

## 2023-07-10 VITALS — BP 109/76 | HR 83 | Temp 98.0°F | Ht 66.0 in | Wt 168.0 lb

## 2023-07-10 DIAGNOSIS — E782 Mixed hyperlipidemia: Secondary | ICD-10-CM

## 2023-07-10 DIAGNOSIS — E538 Deficiency of other specified B group vitamins: Secondary | ICD-10-CM

## 2023-07-10 DIAGNOSIS — E559 Vitamin D deficiency, unspecified: Secondary | ICD-10-CM | POA: Diagnosis not present

## 2023-07-10 DIAGNOSIS — R739 Hyperglycemia, unspecified: Secondary | ICD-10-CM

## 2023-07-10 DIAGNOSIS — Z6827 Body mass index (BMI) 27.0-27.9, adult: Secondary | ICD-10-CM

## 2023-07-10 DIAGNOSIS — K5909 Other constipation: Secondary | ICD-10-CM | POA: Diagnosis not present

## 2023-07-10 DIAGNOSIS — E039 Hypothyroidism, unspecified: Secondary | ICD-10-CM

## 2023-07-10 DIAGNOSIS — E669 Obesity, unspecified: Secondary | ICD-10-CM

## 2023-07-10 MED ORDER — ZEPBOUND 2.5 MG/0.5ML ~~LOC~~ SOLN: 2.5000 mg | SUBCUTANEOUS | 0 refills | Status: DC

## 2023-07-10 MED ORDER — VITAMIN D (ERGOCALCIFEROL) 1.25 MG (50000 UNIT) PO CAPS: 50000.0000 [IU] | ORAL_CAPSULE | ORAL | 0 refills | Status: DC

## 2023-07-10 NOTE — Telephone Encounter (Signed)
Patient called in requesting a new script  for her Vitamin D. Patient is asking to have the script sent in to [facility] on [facility].  Thank you

## 2023-07-10 NOTE — Progress Notes (Unsigned)
Chief Complaint:   OBESITY Molly Giles is here to discuss her progress with her obesity treatment plan along with follow-up of her obesity related diagnoses. Molly Giles is on the Category 1 Plan and states she is following her eating plan approximately 95% of the time. Molly Giles states she is stretching and walking for 15 minutes 5 times per week.  Today's visit was #: 13 Starting weight: 199 lbs Starting date: 12/11/2022 Today's weight: 168 lbs Today's date: 07/10/2023 Total lbs lost to date: 31 Total lbs lost since last in-office visit: 7  Interim History: Patient continues to do well with her weight loss.  Her hunger is controlled and she is not skipping meals.  She is satisfied with her plan and she has increased her exercise.  Subjective:   1. Low serum vitamin B12 Patient is low on vitamin D and B12, and she is due for labs.  2. Vitamin D deficiency Patient is low on vitamin D and B12, and she is due for labs.  3. Other constipation Patient notes increased constipation on GLP-1, and no improvement with Colace twice daily.  4. Hyperglycemia Patient is doing well on Zepbound, but she notes significant constipation.  She notes decreased polyphagia though.  5. Mixed hyperlipidemia Patient is working on her diet, and she is due to have her labs rechecked.  She is on Crestor with no side effects.  6. Hypothyroidism, unspecified type Patient is on Synthroid, and she is working on her weight loss.  She is due for labs.  Assessment/Plan:   1. Low serum vitamin B12 We will check labs between visits, and we will review results at patient's next visit.  - Vitamin B12  2. Vitamin D deficiency We will check labs between visits, and we will review results at patient's next visit.  - VITAMIN D 25 Hydroxy (Vit-D Deficiency, Fractures)  3. Other constipation Patient agreed to start MiraLAX OTC 17 g daily, and increase her water intake.  We will follow-up at her next visit.  4.  Hyperglycemia Patient will continue Zepbound at 2.5 mg once weekly (patient is to take her dose every other week).  - CMP14+EGFR - Insulin, random - Hemoglobin A1c  5. Mixed hyperlipidemia We will check labs between visits, and we will review results at her next visit.  Patient will continue with her diet and exercise.  - Lipid Panel With LDL/HDL Ratio  6. Hypothyroidism, unspecified type We will check labs between visits, and we will follow-up with results at patient's next visit.  - TSH  7. BMI 27.0-27.9,adult  8. Obesity, Beginning BMI 32.1 Patient will continue Zepbound 2.5 mg once weekly, and we will refill for 1 month.  (patient is to take her dose every other week).  - tirzepatide (ZEPBOUND) 2.5 MG/0.5ML injection vial; Inject 2.5 mg into the skin once a week.  Dispense: 2 mL; Refill: 0  Molly Giles is currently in the action stage of change. As such, her goal is to continue with weight loss efforts. She has agreed to the Category 1 Plan.   Exercise goals: As is.   Behavioral modification strategies: increasing lean protein intake.  Molly Giles has agreed to follow-up with our clinic in 4 weeks. She was informed of the importance of frequent follow-up visits to maximize her success with intensive lifestyle modifications for her multiple health conditions.   Molly Giles was informed we would discuss her lab results at her next visit unless there is a critical issue that needs to be addressed sooner. Harriett Sine  agreed to keep her next visit at the agreed upon time to discuss these results.  Objective:   Blood pressure 109/76, pulse 83, temperature 98 F (36.7 C), height 5\' 6"  (1.676 m), weight 168 lb (76.2 kg), SpO2 99%. Body mass index is 27.12 kg/m.  Lab Results  Component Value Date   CREATININE 0.88 12/11/2022   BUN 16 12/11/2022   NA 138 12/11/2022   K 4.0 12/11/2022   CL 103 12/11/2022   CO2 19 (L) 12/11/2022   Lab Results  Component Value Date   ALT 20 12/11/2022   AST 19  12/11/2022   ALKPHOS 81 12/11/2022   BILITOT 0.3 12/11/2022   Lab Results  Component Value Date   HGBA1C 5.3 12/11/2022   HGBA1C 5.2 06/26/2019   HGBA1C 5.0 02/07/2018   HGBA1C 5.2 07/04/2017   HGBA1C 5.3 03/22/2017   Lab Results  Component Value Date   INSULIN 7.8 12/11/2022   INSULIN 8.3 05/07/2018   INSULIN 11.8 07/04/2017   INSULIN 10.5 03/22/2017   Lab Results  Component Value Date   TSH 1.970 05/17/2023   Lab Results  Component Value Date   CHOL 176 12/11/2022   HDL 54 12/11/2022   LDLCALC 101 (H) 12/11/2022   TRIG 117 12/11/2022   CHOLHDL 3.3 10/11/2021   Lab Results  Component Value Date   VD25OH 41.2 12/11/2022   VD25OH 43.3 05/07/2018   VD25OH 48.5 07/04/2017   Lab Results  Component Value Date   WBC 6.4 08/04/2020   HGB 13.5 08/04/2020   HCT 41 08/04/2020   MCV 95.0 07/05/2019   PLT 262 08/04/2020   No results found for: "IRON", "TIBC", "FERRITIN"  Attestation Statements:   Reviewed by clinician on day of visit: allergies, medications, problem list, medical history, surgical history, family history, social history, and previous encounter notes.   I, Burt Knack, am acting as transcriptionist for Quillian Quince, MD.  I have reviewed the above documentation for accuracy and completeness, and I agree with the above. -  Quillian Quince, MD

## 2023-07-14 ENCOUNTER — Other Ambulatory Visit (INDEPENDENT_AMBULATORY_CARE_PROVIDER_SITE_OTHER): Payer: Self-pay | Admitting: Family Medicine

## 2023-07-14 DIAGNOSIS — R632 Polyphagia: Secondary | ICD-10-CM

## 2023-07-18 ENCOUNTER — Ambulatory Visit (HOSPITAL_BASED_OUTPATIENT_CLINIC_OR_DEPARTMENT_OTHER): Payer: Medicare Other | Admitting: Psychiatry

## 2023-07-18 DIAGNOSIS — F4323 Adjustment disorder with mixed anxiety and depressed mood: Secondary | ICD-10-CM

## 2023-07-18 MED ORDER — ZOLPIDEM TARTRATE 5 MG PO TABS: 5.0000 mg | ORAL_TABLET | Freq: Every evening | ORAL | 5 refills | Status: DC | PRN

## 2023-07-18 NOTE — Progress Notes (Signed)
Psychiatric Initial Adult Assessment   Patient Identification: Molly Giles MRN:  [id number] Date of Evaluation:  07/18/2023 Referral Source:  Chief Complaint:   Visit Diagnosis: Adjustment disorder  History of Present Illness: Dr. Awilda Bill   Today the patient is doing very well.  Her relationship with her daughter-in-law is better.  Her family is getting along much better.  Importantly the patient had her mask refitted for his CPAP machine.  It is working better.  All the signs of sleep apnea seem to be better.  She drinks no alcohol and uses no drugs.  She takes 5 mg of Ambien and does very well.  She has had no falls and cognitively she feels great.  The patient has also lost 30 pounds.  Her health is doing great.  She takes Celexa actually from her neurologist.  She says efforts to reduce or discontinue the Celexa led to getting very depressed.  Therefore I suspect there probably is an underlying depression disorder.  Given that I think a good night's sleep is very important for maintenance for treatment for depression.  I think it is another reason for her to take a low-dose Ambien as she is doing. Associated Signs/Symptoms: Depression Symptoms:  anxiety, (Hypo) Manic Symptoms:   Anxiety Symptoms:  Excessive Worry, Psychotic Symptoms:  PTSD Symptoms: NA  Past Psychiatric History:   Previous Psychotropic Medications: Yes   Substance Abuse History in the last 12 months:  No.  Consequences of Substance Abuse: NA  Past Medical History:  Past Medical History:  Diagnosis Date   Abdominal bloating 05/10/2021   Abdominal discomfort, generalized 05/07/2016   Acute gastritis 11/19/2020   Allergic rhinoconjunctivitis 07/02/2015   Anal fissure 11/19/2020   Anterior to posterior tear of superior glenoid labrum of left shoulder 02/22/2021   Arthritis    Benign positional vertigo 04/2015   Responded well to [facility]   Bruxism (teeth grinding)    Chronic contact  dermatitis 08/06/2018   Per allergist Dr Edwyna Ready.    Chronic migraine 02/25/2016   Per Dr Lucia Gaskins review in notes from [facility] from September 2015. Showed total headache days last month 18. Severe headache days 7 days. Moderate headache days 5 days. Mild headache days last month sick days. Days without headache last month 10 days. Symptoms associated with photophobia, phonophobia, osmophobia, neck pain, dizziness, jaw pain, nasal congestion, vision disturbances, tingling and numbness, weakness and worsening with activity. Each headache attack last 3 hours depending on treatment in severity. Left side, the right side, easier side, the frontal area in the back of the head. Characterized as throbbing, pressure, tightness, squeezing, stabbing and burning    Chronic migraine without aura 05/15/2013    Dr Lucia Gaskins [facility]   Chronic tonsillitis 01/27/2019   DDD (degenerative disc disease), cervical 11/18/2018   DDD (degenerative disc disease), lumbar    Depression    Disc displacement, lumbar    Dysphagia 10/11/2021   Dysrhythmia    seen by dr Donnie Aho- not a problem since she has been on Bystolic   Episodic cluster headache, not intractable 03/06/2017   Essential hypertension 05/15/2013   Family history of adverse reaction to anesthesia    Brother- N/V   Family history of premature CAD 05/15/2013   Fibromyalgia syndrome 07/01/2015   Management by Dr Kathie Rhodes. Devashwar (Rheum)    GERD (gastroesophageal reflux disease) 12/16/2014   H/O seasonal allergies    Hallux rigidus of right foot 09/25/2022   Hammer toe  Left great toe   Hashimoto's thyroiditis    Per patient, diagnosed by Dr. Ferdinand Cava   Hearing loss of both ears 07/27/2015   mild to borderline moderate low frequency hearing loss improving to within normal limits bilaterally on audiology testing at [facility] in November 2016.     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   Hyperlipidemia 1998   Hypokalemia due to excessive gastrointestinal loss of potassium 07/28/2019   Hypothyroidism    Iliopsoas bursitis of left hip 05/08/2022   Impairment of balance 02/2015   Consequent of postconcussive syndrome   Injury of triangular fibrocartilage complex of left wrist 02/25/2019   Dx 02/25/19 Bradly Bienenstock IV MD ([facility])   Insulin resistance 07/04/2017   Internal hemorrhoid 01/28/2021   Internal hemorrhoid seen on colonoscopy 10/2020 Roderic Scarce MD [facility])   Interstitial cystitis    Irritable bowel syndrome with diarrhea 04/03/2016   Left ventricular hypertrophy, mild 02/25/2016   ECHOcardiogram report 06/17/15 showing EF55-60%, mild LVH and G1DD    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HCC) 03/10/2018   [facility]   Lumbar facet joint pain    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   Mood disorder (HCC)    Morbid obesity (HCC) 03/06/2017   Musculoskeletal neck pain 07/14/2015   Nocturnal hypoxemia 03/06/2017   Normal coronary arteries 05/14/2014   Obesity (BMI 30.0-34.9) 01/29/2017   Odynophagia 12/20/2021   Osteoarthritis of left hip 11/01/2015   MRI order by Dr Charlann Boxer (ortho) 10/2015 showed significant arthritis of left hip joint with cystic changes in femoral head c/w osteoarthritis   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, lumbar region 10/30/2011   Other insomnia 11/09/2016   Overweight    Pain in joint of left shoulder 11/09/2016   Pain in joint, multiple sites 11/18/2018   Palpitations 05/15/2013   [facility] manages   Perianal candidiasis 12/20/2021   Periodic limb movement sleep disorder 03/28/2017   Perirectal cyst 05/07/2016    Poison ivy dermatitis 03/24/2021   PONV (postoperative nausea and vomiting)    Positive ANA (antinuclear antibody) 11/18/2018   Post concussion syndrome 06/06/2015   Post concussive syndrome 07/14/2015   Ms Saavedra's post-concussive syndrome manifesting in vertigo and headache, mood changes, poor balance, dizziness, and decreased concentration per Dr Lucia [facility]ins [facility].    Posterior vitreous detachment of right eye 2014   Premature menopause 12/20/2021   Rectal abscess 05/10/2021   S/P left THA, AA 07/25/2016   Sacroiliac inflammation (HCC) 08/18/2021   Shingles    Shortness of breath dyspnea    with exertion   Sicca syndrome (HCC) 11/18/2018   (+) ANA   Sleep walking and eating 03/06/2017   Snoring 03/06/2017   Spondylosis of lumbar region without myelopathy or radiculopathy 10/30/2011   TFC (triangular fibrocartilage complex) injury 02/25/2019   Thyroid nodule 08/11/2009   Findings: The thyroid gland is within normal limits in size.  The gland is diffusely inhomogeneous. A small solid nodule is noted in the lower pole  medially on the right of 7 x 6 x 8 mm. A small solid nodule is noted inferiorly on the left of 3 x 3 x 4 mm.  IMPRESSION:  The thyroid gland is within normal limits in size with only small solid nodules present, the largest of only 8 mm in diameter on the right.     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    Trochanteric bursitis, right hip 04/26/2020   Tubular adenoma of colon 01/28/2021   Colonoscopy screening 4 mm tubular Adenoma polyp Charlott Rakes, MD [facility])   Tubular adenoma of colon 01/28/2021   Colonoscopy screening 4 mm tubular Adenoma polyp Charlott Rakes, MD [facility])   Vasomotor symptoms due to menopause 04/19/2017   Vitamin D deficiency 05/08/2017   Vulvitis 07/22/2020   Yeast vaginitis 07/11/2019    Past Surgical History:  Procedure Laterality Date   arthroscopy Left 01/2022    with SAD and DCR, K. Supple MD   Bladder dilitation     x 3   BREAST BIOPSY Right 2011   Benign histology   CARDIOVASCULAR STRESS TEST  2000   Unremarkable per pt report   CARPOMETACARPAL JOINT ARTHROTOMY Right 2011   COLONOSCOPY     COLONOSCOPY WITH PROPOFOL N/A 04/21/2015   Procedure: COLONOSCOPY WITH PROPOFOL;  Surgeon: Jeani Hawking, MD;  Location: [facility] ENDOSCOPY;  Service: Endoscopy;  Laterality: N/A;   CYSTOSCOPY W/ DILATION OF BLADDER N/A    EPIDURAL BLOCK INJECTION Left 04/12/2016   Left Medial Nerve Block and Left L5 ramus block, Dr Sheran Luz    EPIDURAL BLOCK INJECTION  03/21/2016   Left L3-4 medial branch block and Left L5 & dorsal ramus block    EPIDURAL BLOCK INJECTION N/A 10/25/2016   Sheran Luz, MD. Lumbar medial branch block   EPIDURAL BLOCK INJECTION N/A 02/09/2017   Sheran Luz, MD.  Bilateral L3/4 medial branch block, bilateral L5 dorsal ramus block   EPIDURAL BLOCK INJECTION N/A 07/04/2017   Sheran Luz, MD   EXCISION MORTON'S NEUROMA Right 05/03/2023   Procedure: EXCISION MORTON'S NEUROMA;  Surgeon: Toni Arthurs, MD;  Location: [facility];  Service: Orthopedics;  Laterality: Right;  general, local by surgeon 60 MIN   FECAL TRANSPLANT  04/21/2015   Procedure: FECAL TRANSPLANT;  Surgeon: Jeani Hawking, MD;  Location: [facility] ENDOSCOPY;  Service: Endoscopy;;   HIP ARTHROPLASTY Left    HIP ARTHROSCOPY Left 03/06/2018   Left hip arthroplasty, redo for loose hip arthroplasty. Procedure at [facility] hospital   INJECTION HIP INTRA ARTICULAR Left 11/2015   for OA by Dr Vilma Prader IMPLANT PLACEMENT  04/2021   Floyde Parkins MD ([facility])   INTERSTIM IMPLANT REVISION N/A 09/2021   Floyde Parkins MD ([facility])   LIGAMENT REPAIR Right 05/03/2023   Procedure: COLLATERAL LIGAMENT REPAIR;  Surgeon: Toni Arthurs, MD;  Location: [facility];  Service: Orthopedics;  Laterality: Right;  general, local by  surgeon 60 MIN   OTHER SURGICAL HISTORY Left 2016   Left L3/L4 medial nerve block and Left L5 Dorsal Ramus block Dr Rosine Abe   TOTAL HIP ARTHROPLASTY Left 07/25/2016   Procedure: LEFT TOTAL HIP ARTHROPLASTY ANTERIOR APPROACH;  Surgeon: Durene Romans, MD;  Location: [facility];  Service: Orthopedics;  Laterality: Left;   WEIL OSTEOTOMY Right 05/03/2023   Procedure: RIGHT 2ND WEIL OSTEOTOMY;  Surgeon: Toni Arthurs, MD;  Location: [facility];  Service: Orthopedics;  Laterality: Right;  general, local by surgeon 60 MIN    Family Psychiatric History:   Family History:  Family History  Problem Relation  Age of Onset   Alzheimer's disease Mother    Hyperlipidemia Mother    Hypertension Mother    Osteoporosis Mother    Parkinson's disease Mother    Migraines Sister    Allergies Sister    Hypertension Sister    Hyperlipidemia Brother    Cardiomyopathy Brother    Diabetes type II Brother    Kidney disease Brother    Asthma Brother    Heart disease Brother    Hypertension Brother    Hyperlipidemia Brother    Kidney disease Brother    Heart disease Brother    Heart disease Father    Hyperlipidemia Father    Hypertension Father    Aortic aneurysm Father    Early death Father 61   Diabetes type II Other    Breast cancer Maternal Aunt     Social History:   Social History   Socioeconomic History   Marital status: Married    Spouse name: Annette Stable Ocallaghan   Number of children: 1   Years of education: 16   Highest education level: Bachelor's degree (e.g., BA, AB, BS)  Occupational History   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   Drug use: No   Sexual activity: Yes    Partners: Male    Birth control/protection: None  Other Topics Concern   Not on file  Social History Narrative   Prior PCP With [facility] at [place], [place].   Married, lives with [place], (b. 1954)   Son in [place]. (2) grandchildren.   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    Wears seatbelt usually   No religious beliefs affecting healthcare   No difficulty taking medications as directed.       Home has working smoke alarm   No home throw rugs   Does not have nonslip bathtub / shower    Has railings on all stairs   Home is free from Clutter.    Right-handed.      Best number to reach patient [phone] (M) as of 09/21/2021.    No regular exercise of 3 times a week for 30 minutes at a time      Isma Tietje has Scientist, water quality and a Sports administrator is husband, Annalee Meyerhoff [phone]      Caffeine: 2-3 cups coffee per day   Social Determinants of Health   Financial Resource Strain: Low Risk  (03/16/2023)   Overall Financial Resource Strain (CARDIA)    Difficulty of Paying Living Expenses: Not hard at all  Food Insecurity: No Food Insecurity (03/16/2023)   Hunger Vital Sign    Worried About Running Out of Food in the Last Year: Never true    Ran Out of Food in the Last Year: Never true  Transportation Needs: No Transportation Needs (03/16/2023)   PRAPARE - Administrator, Civil Service (Medical): No    Lack of Transportation (Non-Medical): No  Physical Activity: Inactive (03/16/2023)   Exercise Vital Sign    Days of Exercise per Week: 0 days    Minutes of Exercise per Session: 0 min  Stress: No Stress Concern Present (03/16/2023)   [employer] of Occupational Health - Occupational Stress Questionnaire    Feeling of Stress : Only a little  Social Connections: Moderately  Integrated (03/16/2023)   Social Connection and Isolation Panel [NHANES]    Frequency of Communication with Friends and Family: Once a week    Frequency of Social Gatherings with Friends and Family: Twice a week    Attends Religious Services: Never    Database administrator or Organizations: Yes    Attends Engineer, structural: More than 4 times per year    Marital Status: Married    Additional Social History:   Allergies:   Allergies  Allergen Reactions   Sulfa Antibiotics Itching   Chlorhexidine Gluconate Rash and Other (See Comments)    Burning "only when used in private area, otherwise OK to use"   Codeine Nausea Only    "head nausea"    Levofloxacin Other (See Comments)    Pain in arm and behind ankles. Pain in arm and behind ankles.    Lyrica [Pregabalin] Other (See Comments)    Dizziness, nausea   Methylprednisolone     Flushing of the face   Prednisone     Flushing of the face   Betadine [Povidone Iodine] Rash    burning   Latex Itching and Rash    burning   Wellbutrin [Bupropion] Anxiety    Metabolic Disorder Labs: Lab Results  Component Value Date   HGBA1C 5.3 12/11/2022   No results found for: "PROLACTIN" Lab Results  Component Value Date   CHOL 176 12/11/2022   TRIG 117 12/11/2022   HDL 54 12/11/2022   CHOLHDL 3.3 10/11/2021   VLDL 26 12/14/2014   LDLCALC 101 (H) 12/11/2022   LDLCALC 104 (H) 10/11/2021   Lab Results  Component Value Date   TSH 1.970 05/17/2023    Therapeutic Level Labs: No results found for: "LITHIUM" No results found for: "CBMZ" No results found for: "VALPROATE"  Current Medications: Current Outpatient Medications  Medication Sig Dispense Refill   Ascorbic Acid (VITAMIN C PO) Take by mouth.     citalopram (CELEXA) 20 MG tablet TAKE 1 TABLET(20 MG) BY MOUTH DAILY 90 tablet 3   dicyclomine (BENTYL) 10 MG capsule Take 1 capsule (10 mg total) by mouth 3 (three) times daily as needed. 90 capsule PRN    Galcanezumab-gnlm (EMGALITY) 120 MG/ML SOAJ Inject 120 mg into the skin every 30 (thirty) days. 3 mL 3   levothyroxine (SYNTHROID) 125 MCG tablet Take 0.5 tablets (62.5 mcg total) by mouth daily before breakfast. 45 tablet 3   meloxicam (MOBIC) 15 MG tablet Take by mouth as needed.     methenamine (HIPREX) 1 g tablet Take 1 g by mouth 2 (two) times daily with a meal.     methocarbamol (ROBAXIN) 500 MG tablet Take 500 mg by mouth as needed.      minoxidil (LONITEN) 2.5 MG tablet Take by mouth daily.     rizatriptan (MAXALT) 10 MG tablet Take 1 tablet (10 mg total) by mouth as needed for migraine. Take 1 tablet at onset of migraine. May repeat in 2 hours if needed. (Do not exceed more than 2 tab in 24 hours) 10 tablet 3   rosuvastatin (CRESTOR) 10 MG tablet TAKE 1 TABLET(10 MG)  BY MOUTH DAILY 90 tablet 3   SYSTANE BALANCE 0.6 % SOLN      tirzepatide (ZEPBOUND) 2.5 MG/0.5ML injection vial Inject 2.5 mg into the skin once a week. 2 mL 0   topiramate (TOPAMAX) 25 MG tablet Take 1 tablet (25 mg total) by mouth daily for 14 days, THEN 2 tablets (50 mg total) daily for 14 days. 45 tablet 0   traMADol (ULTRAM) 50 MG tablet Take 1 tablet by mouth 2 (two) times daily as needed.     Vitamin D, Ergocalciferol, (DRISDOL) 1.25 MG (50000 UNIT) CAPS capsule Take 1 capsule (50,000 Units total) by mouth every 7 (seven) days. 5 capsule 0   zolpidem (AMBIEN) 5 MG tablet Take 1 tablet (5 mg total) by mouth at bedtime as needed for sleep. 30 tablet 5   No current facility-administered medications for this visit.    Musculoskeletal: Strength & Muscle Tone: within normal limits Gait & Station: normal Patient leans: Right  Psychiatric Specialty Exam: Review of Systems  There were no vitals taken for this visit.There is no height or weight on file to calculate BMI.  General Appearance: Fairly Groomed  Eye Contact:  Good  Speech:  Clear and Coherent  Volume:  Normal  Mood:  Anxious  Affect:  Appropriate  Thought  Process:  Coherent  Orientation:  Full (Time, Place, and Person)  Thought Content:  WDL  Suicidal Thoughts:  No  Homicidal Thoughts:  No  Memory:  NA  Judgement:  Good  Insight:  Good  Psychomotor Activity:  Normal  Concentration:  Concentration: Good  Recall:  Good  Fund of Knowledge:Good  Language: Good  Akathisia:  No  Handed:  Right  AIMS (if indicated):  not done  Assets:  Desire for Improvement  ADL's:  Intact  Cognition: WNL  Sleep:  Good   Screenings: PHQ2-9    Flowsheet Row Office Visit from 05/17/2023 in [facility] - A Dept Of [place]. [facility] Clinical Support from 12/07/2022 in [facility] - A Dept Of Eligha Bridegroom. [facility] Office Visit from 04/10/2022 in [facility] - A Dept Of [facility] Brideg[facility]. [facility] Office Visit from 02/02/2022 in [facility] - A Dept Of Eligha Bridegroom. [facility] Office Visit from 10/11/2021 in [facility] - A Dept Of [place]. [facility]  PHQ-2 Total Score 0 0 1 2 2   PHQ-9 Total Score 7 -- 10 9 10       Flowsheet Row Admission (Discharged) from 05/03/2023 in [facility]  C-SSRS RISK CATEGORY No Risk       Assessment and Plan:     Diagnosis is adjustment disorder with a depressed mood state.  At this time she will continue taking Celexa from another provider.  I will contribute to her care by giving her low-dose Ambien 5 mg.  This together with a CPAP system meets her needs.  The patient denies the use of drugs or alcohol.  She is very responsible and compliant.  She is doing very well at this time.  She will return to see me in 5 months.  Patient/Guardian was advised Release of Information must be obtained prior to any record release in order to collaborate their care with an outside provider. Patient/Guardian was advised if they have not already done so to contact the registration department to sign all necessary forms in order  for [name] to  release information regarding their care.   Consent: Patient/Guardian gives verbal consent for treatment and assignment of benefits for services provided during this visit. Patient/Guardian expressed understanding and agreed to proceed.   Gypsy Balsam, MD 11/6/20242:02 PM

## 2023-07-24 ENCOUNTER — Ambulatory Visit: Payer: Medicare Other | Admitting: Rehabilitative and Restorative Service Providers"

## 2023-07-31 ENCOUNTER — Encounter: Payer: Self-pay | Admitting: Rehabilitative and Restorative Service Providers"

## 2023-07-31 ENCOUNTER — Encounter (INDEPENDENT_AMBULATORY_CARE_PROVIDER_SITE_OTHER): Payer: Self-pay | Admitting: Family Medicine

## 2023-07-31 ENCOUNTER — Ambulatory Visit: Payer: Medicare Other | Attending: Physician Assistant | Admitting: Rehabilitative and Restorative Service Providers"

## 2023-07-31 ENCOUNTER — Ambulatory Visit (INDEPENDENT_AMBULATORY_CARE_PROVIDER_SITE_OTHER): Payer: Medicare Other | Admitting: Family Medicine

## 2023-07-31 VITALS — BP 109/77 | HR 72 | Temp 97.4°F | Ht 66.0 in | Wt 168.0 lb

## 2023-07-31 DIAGNOSIS — M542 Cervicalgia: Secondary | ICD-10-CM | POA: Insufficient documentation

## 2023-07-31 DIAGNOSIS — H8112 Benign paroxysmal vertigo, left ear: Secondary | ICD-10-CM | POA: Insufficient documentation

## 2023-07-31 DIAGNOSIS — E559 Vitamin D deficiency, unspecified: Secondary | ICD-10-CM

## 2023-07-31 DIAGNOSIS — Z6827 Body mass index (BMI) 27.0-27.9, adult: Secondary | ICD-10-CM

## 2023-07-31 DIAGNOSIS — E669 Obesity, unspecified: Secondary | ICD-10-CM | POA: Diagnosis not present

## 2023-07-31 DIAGNOSIS — R42 Dizziness and giddiness: Secondary | ICD-10-CM | POA: Insufficient documentation

## 2023-07-31 MED ORDER — VITAMIN D (ERGOCALCIFEROL) 1.25 MG (50000 UNIT) PO CAPS: 50000.0000 [IU] | ORAL_CAPSULE | ORAL | 0 refills | Status: DC

## 2023-07-31 NOTE — Progress Notes (Signed)
.[facility]  Office: [phone]  /  Fax: [phone]  WEIGHT SUMMARY AND BIOMETRICS  Anthropometric Measurements Height: 5\' 6"  (1.676 m) Weight: 168 lb (76.2 kg) BMI (Calculated): 27.13 Weight at Last Visit: 168 lb Weight Lost Since Last Visit: 0 Weight Gained Since Last Visit: 0 Starting Weight: 199 lb Total Weight Loss (lbs): 31 lb (14.1 kg)   Body Composition  Body Fat %: 42 % Fat Mass (lbs): 70.6 lbs Muscle Mass (lbs): 92.4 lbs Total Body Water (lbs): 69.4 lbs Visceral Fat Rating : 11   Other Clinical Data Fasting: No Labs: No Today's Visit #: 14 Starting Date: 12/11/22    Chief Complaint: OBESITY   History of Present Illness   The patient, with a known history of obesity and vitamin D deficiency, presents for a follow-up consultation. She has been on prescription vitamin D (50,000 international units weekly) and her last vitamin D level, drawn earlier this year, was slightly below the target. She reports maintaining her weight over the last three weeks and adhering to her category one eating plan 95% of the time. However, she is not currently engaging in any exercise regimen.  The patient has been experiencing vertigo and dizziness, for which she has been receiving physical therapy. She reports aching after the exercises provided by the physical therapist and is considering following a YouTube yoga program for strength exercises. She also mentions having access to a gym through the [employer] program but has not been attending due to concerns related to the ongoing pandemic.  The patient also reports an episode of nausea on the day of the consultation, which she attributes to the shots she has been receiving. This is the second time she has experienced nausea since starting the shots. She also expresses concerns about her abdominal area, thighs, and arms, questioning if these areas will ever tighten up despite her weight loss. She has been considering strength exercises  to help with these areas.  The patient is also preparing for a family gathering for Thanksgiving and is planning to prepare a meal that includes a chicken pot pie and pot roast. She expresses confidence in her ability to make healthy choices during the holiday meal. She also expresses a desire to participate in the exercise classes offered at the clinic.          PHYSICAL EXAM:  Blood pressure 109/77, pulse 72, temperature (!) 97.4 F (36.3 C), height 5\' 6"  (1.676 m), weight 168 lb (76.2 kg), SpO2 99%. Body mass index is 27.12 kg/m.  DIAGNOSTIC DATA REVIEWED:  BMET    Component Value Date/Time   NA 138 12/11/2022 1105   K 4.0 12/11/2022 1105   CL 103 12/11/2022 1105   CO2 19 (L) 12/11/2022 1105   GLUCOSE 84 12/11/2022 1105   GLUCOSE 93 07/05/2019 1629   BUN 16 12/11/2022 1105   CREATININE 0.88 12/11/2022 1105   CREATININE 0.74 02/24/2016 1158   CALCIUM 9.4 12/11/2022 1105   GFRNONAA 65 07/24/2019 1116   GFRNONAA 88 02/24/2016 1158   GFRAA 75 07/24/2019 1116   GFRAA >89 02/24/2016 1158   Lab Results  Component Value Date   HGBA1C 5.3 12/11/2022   HGBA1C 5.3 03/22/2017   Lab Results  Component Value Date   INSULIN 7.8 12/11/2022   INSULIN 10.5 03/22/2017   Lab Results  Component Value Date   TSH 1.970 05/17/2023   CBC    Component Value Date/Time   WBC 6.4 08/04/2020 0000   WBC 6.6 07/05/2019 1629  RBC 4.26 08/04/2020 0000   HGB 13.5 08/04/2020 0000   HGB 12.6 05/07/2018 1057   HCT 41 08/04/2020 0000   HCT 38.8 05/07/2018 1057   PLT 262 08/04/2020 0000   MCV 95.0 07/05/2019 1629   MCV 90 05/07/2018 1057   MCH 30.0 07/05/2019 1629   MCHC 31.6 07/05/2019 1629   RDW 13.8 07/05/2019 1629   RDW 14.0 05/07/2018 1057   Iron Studies No results found for: "IRON", "TIBC", "FERRITIN", "IRONPCTSAT" Lipid Panel     Component Value Date/Time   CHOL 176 12/11/2022 1105   TRIG 117 12/11/2022 1105   HDL 54 12/11/2022 1105   CHOLHDL 3.3 10/11/2021 1142    CHOLHDL 3.9 12/14/2014 1036   VLDL 26 12/14/2014 1036   LDLCALC 101 (H) 12/11/2022 1105   Hepatic Function Panel     Component Value Date/Time   PROT 6.5 12/11/2022 1105   ALBUMIN 4.5 12/11/2022 1105   AST 19 12/11/2022 1105   ALT 20 12/11/2022 1105   ALKPHOS 81 12/11/2022 1105   BILITOT 0.3 12/11/2022 1105   BILIDIR 0.14 02/12/2020 1056      Component Value Date/Time   TSH 1.970 05/17/2023 1627   Nutritional Lab Results  Component Value Date   VD25OH 41.2 12/11/2022   VD25OH 43.3 05/07/2018   VD25OH 48.5 07/04/2017     Assessment and Plan    Obesity Obesity with well-managed weight over the last three weeks. Following a category one eating plan 95% of the time but not currently exercising. Discussed the importance of incorporating strengthening exercises to prevent muscle mass loss and maintain metabolic rate. Advised by physical therapist to follow strength exercises and recommended 'Yoga with Hansel Starling' on YouTube. Discussed benefits of simple exercises that can be done at home without requiring a full workout. Explained that muscle loss can decrease metabolic rate, and maintaining muscle mass is crucial for weight management. Discussed the importance of protein intake to support muscle maintenance. - Incorporate strengthening exercises as recommended by the physical therapist - Consider following 'Yoga with Hansel Starling' on YouTube for strength exercises - Perform simple exercises such as heel and toe lifts throughout the day - Ensure adequate protein intake to support muscle maintenance  Vitamin D Deficiency Vitamin D deficiency with last level slightly below goal. On prescription vitamin D 50,000 IU once a week. Discussed the importance of rechecking levels to ensure adequacy. - Continue prescription vitamin D 50,000 IU once a week - Recheck vitamin D levels at next visit  General Health Maintenance Discussed the importance of maintaining hydration and moisturizing skin to  support skin elasticity, especially in areas with loose skin due to weight loss. Encouraged to continue following eating plan and manage special occasion meals mindfully. Advised to avoid indulging in leftovers for extended periods to maintain weight management. - Maintain hydration and use lotion on areas with loose skin - Follow eating plan during Thanksgiving and manage portion sizes - Avoid indulging in leftovers for extended periods  Follow-up - Complete blood work before next visit - Follow-up appointment on August 22, 2023 - Schedule January appointment to ensure continuity of care.         She was informed of the importance of frequent follow up visits to maximize her success with intensive lifestyle modifications for her multiple health conditions.    Quillian Quince, MD

## 2023-07-31 NOTE — Therapy (Signed)
OUTPATIENT PHYSICAL THERAPY VESTIBULAR TREATMENT     Patient Name: Molly Giles MRN: [id number] DOB:1956/04/30, 67 y.o., female Today's Date: 07/31/2023  END OF SESSION:  PT End of Session - 07/31/23 1021     Visit Number 7    Number of Visits 14    Date for PT Re-Evaluation 09/02/23    Authorization Type Medicare A and B    Progress Note Due on Visit 10    PT Start Time 1020    PT Stop Time 1100    PT Time Calculation (min) 40 min    Activity Tolerance Patient tolerated treatment well    Behavior During Therapy [facility] for tasks assessed/performed              Past Medical History:  Diagnosis Date   Abdominal bloating 05/10/2021   Abdominal discomfort, generalized 05/07/2016   Acute gastritis 11/19/2020   Allergic rhinoconjunctivitis 07/02/2015   Anal fissure 11/19/2020   Anterior to posterior tear of superior glenoid labrum of left shoulder 02/22/2021   Arthritis    Benign positional vertigo 04/2015   Responded well to Vestibular Rehab   Bruxism (teeth grinding)    Chronic contact dermatitis 08/06/2018   Per allergist Dr Edwyna Ready.    Chronic migraine 02/25/2016   Per Dr Lucia Gaskins review in notes from [facility] from September 2015. Showed total headache days last month 18. Severe headache days 7 days. Moderate headache days 5 days. Mild headache days last month sick days. Days without headache last month 10 days. Symptoms associated with photophobia, phonophobia, osmophobia, neck pain, dizziness, jaw pain, nasal congestion, vision disturbances, tingling and numbness, weakness and worsening with activity. Each headache attack last 3 hours depending on treatment in severity. Left side, the right side, easier side, the frontal area in the back of the head. Characterized as throbbing, pressure, tightness, squeezing, stabbing and burning    Chronic migraine without aura 05/15/2013    Dr Lucia Gaskins [facility]   Chronic tonsillitis  01/27/2019   DDD (degenerative disc disease), cervical 11/18/2018   DDD (degenerative disc disease), lumbar    Depression    Disc displacement, lumbar    Dysphagia 10/11/2021   Dysrhythmia    seen by dr Donnie Aho- not a problem since she has been on Bystolic   Episodic cluster headache, not intractable 03/06/2017   Essential hypertension 05/15/2013   Family history of adverse reaction to anesthesia    Brother- N/V   Family history of premature CAD 05/15/2013   Fibromyalgia syndrome 07/01/2015   Management by Dr Kathie Rhodes. Devashwar (Rheum)    GERD (gastroesophageal reflux disease) 12/16/2014   H/O seasonal allergies    Hallux rigidus of right foot 09/25/2022   Hammer toe    Left great toe   Hashimoto's thyroiditis    Per patient, diagnosed by Dr. Ferdinand Cava   Hearing loss of both ears 07/27/2015   mild to borderline moderate low frequency hearing loss improving to within normal limits bilaterally on audiology testing at [facility] in November 2016.     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   Hyperlipidemia 1998   Hypokalemia due to excessive gastrointestinal loss of potassium 07/28/2019  Hypothyroidism    Iliopsoas bursitis of left hip 05/08/2022   Impairment of balance 02/2015   Consequent of postconcussive syndrome   Injury of triangular fibrocartilage complex of left wrist 02/25/2019   Dx 02/25/19 Bradly Bienenstock IV MD ([facility])   Insulin resistance 07/04/2017   Internal hemorrhoid 01/28/2021   Internal hemorrhoid seen on colonoscopy 10/2020 Roderic Scarce MD [facility])   Interstitial cystitis    Irritable bowel syndrome with diarrhea 04/03/2016   Left ventricular  hypertrophy, mild 02/25/2016   ECHOcardiogram report 06/17/15 showing EF55-60%, mild LVH and G1DD    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HCC) 03/10/2018   [facility]   Lumbar facet joint pain    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   Mood disorder (HCC)    Morbid obesity (HCC) 03/06/2017   Musculoskeletal neck pain 07/14/2015   Nocturnal hypoxemia 03/06/2017   Normal coronary arteries 05/14/2014   Obesity (BMI 30.0-34.9) 01/29/2017   Odynophagia 12/20/2021   Osteoarthritis of left hip 11/01/2015   MRI order by Dr Charlann Boxer (ortho) 10/2015 showed significant arthritis of left hip joint with cystic changes in femoral head c/w osteoarthritis   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, lumbar region 10/30/2011   Other insomnia 11/09/2016   Overweight    Pain in joint of left shoulder 11/09/2016   Pain in joint, multiple sites 11/18/2018   Palpitations 05/15/2013   [facility] manages   Perianal candidiasis 12/20/2021   Periodic limb movement sleep disorder 03/28/2017   Perirectal cyst 05/07/2016   Poison ivy dermatitis 03/24/2021   PONV (postoperative nausea and vomiting)    Positive ANA (antinuclear antibody) 11/18/2018   Post concussion syndrome 06/06/2015   Post concussive syndrome 07/14/2015   Ms Kobashigawa's post-concussive syndrome manifesting in vertigo and headache, mood changes, poor balance, dizziness, and decreased concentration per Dr Lucia Gaskins at [facility].    Posterior vitreous detachment of right eye 2014   Premature menopause 12/20/2021   Rectal abscess 05/10/2021   S/P left THA, AA 07/25/2016   Sacroiliac inflammation (HCC) 08/18/2021   Shingles    Shortness of breath dyspnea    with exertion   Sicca syndrome (HCC) 11/18/2018   (+) ANA   Sleep walking and eating 03/06/2017   Snoring 03/06/2017   Spondylosis of lumbar region without myelopathy or radiculopathy 10/30/2011   TFC (triangular fibrocartilage complex) injury 02/25/2019   Thyroid  nodule 08/11/2009   Findings: The thyroid gland is within normal limits in size.  The gland is diffusely inhomogeneous. A small solid nodule is noted in the lower pole  medially on the right of 7 x 6 x 8 mm. A small solid nodule is noted inferiorly on the left of 3 x 3 x 4 mm.  IMPRESSION:  The thyroid gland is within normal limits in size with only small solid nodules present, the largest of only 8 mm in diameter on the right.     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    Trochanteric bursitis, right hip 04/26/2020   Tubular adenoma of colon 01/28/2021   Colonoscopy screening 4 mm tubular Adenoma polyp Charlott Rakes, MD [facility])   Tubular adenoma of colon 01/28/2021   Colonoscopy screening 4 mm tubular Adenoma polyp Charlott Rakes, MD [facility])   Vasomotor symptoms due to menopause 04/19/2017   Vitamin D deficiency 05/08/2017   Vulvitis 07/22/2020   Yeast vaginitis 07/11/2019   Past Surgical History:  Procedure Laterality Date  arthroscopy Left 01/2022   with SAD and DCR, K. Supple MD   Bladder dilitation     x 3   BREAST BIOPSY Right 2011   Benign histology   CARDIOVASCULAR STRESS TEST  2000   Unremarkable per pt report   CARPOMETACARPAL JOINT ARTHROTOMY Right 2011   COLONOSCOPY     COLONOSCOPY WITH PROPOFOL N/A 04/21/2015   Procedure: COLONOSCOPY WITH PROPOFOL;  Surgeon: Jeani Hawking, MD;  Location: [facility] ENDOSCOPY;  Service: Endoscopy;  Laterality: N/A;   CYSTOSCOPY W/ DILATION OF BLADDER N/A    EPIDURAL BLOCK INJECTION Left 04/12/2016   Left Medial Nerve Block and Left L5 ramus block, Dr Sheran Luz    EPIDURAL BLOCK INJECTION  03/21/2016   Left L3-4 medial branch block and Left L5 & dorsal ramus block    EPIDURAL BLOCK INJECTION N/A 10/25/2016   Sheran Luz, MD. Lumbar medial branch block   EPIDURAL BLOCK INJECTION N/A 02/09/2017   Sheran Luz, MD.  Bilateral L3/4 medial branch block, bilateral L5  dorsal ramus block   EPIDURAL BLOCK INJECTION N/A 07/04/2017   Sheran Luz, MD   EXCISION MORTON'S NEUROMA Right 05/03/2023   Procedure: EXCISION MORTON'S NEUROMA;  Surgeon: Toni Arthurs, MD;  Location: [facility];  Service: Orthopedics;  Laterality: Right;  general, local by surgeon 60 MIN   FECAL TRANSPLANT  04/21/2015   Procedure: FECAL TRANSPLANT;  Surgeon: Jeani Hawking, MD;  Location: [facility];  Service: Endoscopy;;   HIP ARTHROPLASTY Left    HIP ARTHROSCOPY Left 03/06/2018   Left hip arthroplasty, redo for loose hip arthroplasty. Procedure [facility]   INJECTION HIP INTRA ARTICULAR Left 11/2015   for OA by Dr Vilma Prader IMPLANT PLACEMENT  04/2021   Floyde Parkins MD ([facility])   INTERSTIM IMPLANT REVISION N/A 09/2021   Floyde Parkins MD ([facility])   LIGAMENT REPAIR Right 05/03/2023   Procedure: COLLATERAL LIGAMENT REPAIR;  Surgeon: Toni Arthurs, MD;  Location: [facility];  Service: Orthopedics;  Laterality: Right;  general, local by surgeon 60 MIN   OTHER SURGICAL HISTORY Left 2016   Left L3/L4 medial nerve block and Left L5 Dorsal Ramus block Dr Rosine Abe   TOTAL HIP ARTHROPLASTY Left 07/25/2016   Procedure: LEFT TOTAL HIP ARTHROPLASTY ANTERIOR APPROACH;  Surgeon: Durene Romans, MD;  Location: [facility];  Service: Orthopedics;  Laterality: Left;   WEIL OSTEOTOMY Right 05/03/2023   Procedure: RIGHT 2ND WEIL OSTEOTOMY;  Surgeon: Toni Arthurs, MD;  Location: [facility];  Service: Orthopedics;  Laterality: Right;  general, local by surgeon 60 MIN   Patient Active Problem List   Diagnosis Date Noted   Vertigo 03/19/2023   Shingles 03/19/2023   Polyphagia 03/07/2023   Rash 02/07/2023   BMI 28.0-28.9,adult 02/07/2023   Low serum vitamin B12 01/10/2023   OSA (obstructive sleep apnea) 12/27/2022   BMI 30.0-30.9,adult 12/27/2022   Severe obstructive sleep apnea-hypopnea syndrome 12/22/2022    Hyperglycemia 12/11/2022   Depression screening 12/11/2022   Generalized obesity 12/11/2022   SOBOE (shortness of breath on exertion) 12/11/2022   Other fatigue 12/11/2022   Sleep related headaches 11/07/2022   Intractable episodic cluster headache 11/07/2022   Sleep walking and eating 11/07/2022   Ambien use disorder, 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   Psychophysiological insomnia 11/07/2022   Rhinitis 11/03/2022   Hallux rigidus of right foot 09/25/2022   Morton neuroma, right 09/25/2022   Chronic pain of right knee 05/08/2022  Arthrosis of hand 12/20/2021   History of Clostridioides difficile colitis, required fecal transplantation 12/20/2021   Mixed hyperlipidemia 12/20/2021   Meibomian gland dysfunction (MGD) of both eyes 10/17/2021   Nuclear sclerosis of both eyes 10/17/2021   Epiretinal membrane (ERM) of left eye 10/17/2021   OAB (overactive bladder) 03/02/2021   Osteoarthritis of acromioclavicular joint 02/22/2021   Hyperhidrosis, scalp, 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   Chronic low back pain 05/14/2019   Cervical spondylosis 11/18/2018   DDD (degenerative disc disease), lumbar 11/18/2018   Keratoconjunctivitis sicca 11/18/2018   Chronic contact dermatitis 08/06/2018   Primary osteoarthritis of left knee 06/20/2018   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HCC) 11/29/2017   Insulin resistance 07/04/2017   Vitamin D deficiency 05/08/2017   Periodic limb movement sleep disorder 03/28/2017   Obesity (BMI 30.0-34.9) 01/29/2017   Other insomnia 11/09/2016   Irritable bowel syndrome with diarrhea 04/03/2016   Left ventricular hypertrophy, mild 02/25/2016   Mood disorder (HCC) 07/02/2015   Allergic rhinoconjunctivitis 07/02/2015   Fibromyalgia syndrome 07/01/2015   History of colonic polyps 07/01/2015   GERD (gastroesophageal reflux disease) 12/16/2014   Hypothyroidism 05/15/2013   Pure hypercholesterolemia 05/15/2013   Chronic interstitial cystitis 05/15/2013   Chronic migraine without aura 05/15/2013     PCP: Tawanna Cooler McDiarmid, MD REFERRING PROVIDER: Caro Laroche, DO   REFERRING DIAG: R42 (ICD-10-CM) - Vertigo   THERAPY DIAG:  Dizziness and giddiness  BPPV (benign paroxysmal positional vertigo), left  Cervicalgia  ONSET DATE: 03/19/23  Rationale for Evaluation and Treatment: Rehabilitation  SUBJECTIVE:   SUBJECTIVE STATEMENT: The vertigo is gone. She gets some dizziness if she gets up quickly, further described as lightheaded sensation.  L upper trap and L suboccipitals are tight. She had a bad migraine last week, but none since last Tuesday.   EVAL:The patient had a sudden onset of dizziness in July when at [facility]. She was changing in a dressing room and stood up quickly with room spinning. The initial phase of vertigo lasted x days and she had to cancel a trip. She also hurt her neck and shoulder during that time. She notes dizziness with rolling to the left that lasts for a few seconds.  Pt accompanied by: self PERTINENT HISTORY: Migraines PAIN:  Are you having pain? Yes: NPRS scale: 4/10 Pain location: neck Pain description: soreness Aggravating factors: turn to the R-- gets L pain Relieving factors: head at neutral PRECAUTIONS: None WEIGHT BEARING RESTRICTIONS: No FALLS: Has patient fallen in last 6 months? No LIVING ENVIRONMENT: Lives with: lives with their spouse Lives in: House/apartment PLOF: Independent PATIENT GOALS: reduce dizziness  OBJECTIVE:  Cervical ROM:   Active A/PROM (deg) eval  Flexion WFLs  Extension WFLs  Right lateral flexion WFLs  Left lateral flexion WFLs  Right rotation WFLs  Left rotation WFLs  (Blank rows = not tested) PATIENT SURVEYS:  Was not captured at eval for FOTO  VESTIBULAR ASSESSMENT: (Measures in this section from initial evaluation unless otherwise noted) GENERAL OBSERVATION: The patient ambulates into clinic independently.    SYMPTOM BEHAVIOR:  Subjective history: Sudden onset of dizziness, has h/o dizziness x  years  Non-Vestibular symptoms: migraine symptoms  Type of dizziness: Spinning/Vertigo  Frequency: daily  Duration: seconds at this time (was minutes at onset)  Aggravating factors:  rolling to the left  Relieving factors: head stationary  Progression of symptoms: better  OCULOMOTOR EXAM:  Ocular Alignment: normal  Ocular ROM: No Limitations  Spontaneous Nystagmus: absent  Gaze-Induced Nystagmus: absent  Smooth Pursuits: intact  Saccades:  intact   VESTIBULAR - OCULAR REFLEX:   Slow VOR: Normal  Head-Impulse Test: HIT Right: negative HIT Left: negative  POSITIONAL TESTING: Right Dix-Hallpike: no nystagmus and sensation of fogginess worsens Left Dix-Hallpike: no nystagmus and return to sitting provokes an unsteadiness and patient needs to rest Right Roll Test: no nystagmus Left Roll Test: note a trace of upbeat, rotary nystagmus  [facility] Adult PT Treatment:                                                DATE: 07/31/23 Therapeutic Exercise: Sidelying Open book R and L x 5 reps Supine Scalene stretch Seated Levator stretch Upper trap stretch SCM stretch Prone Scapular retraction with slide x 6 reps Manual Therapy: Suboccipital release Scalene STM Upper trap and suboccipital STM Trigger Point Dry-Needling  Treatment instructions: Expect mild to moderate muscle soreness. Patient verbalized understanding of these instructions and education. Patient Consent Given: Yes Education handout provided: Previously provided Muscles treated: bilat suboccipitals, bilat upper traps Treatment response/outcome: palpable lengthening   [facility] Adult PT Treatment:                                                DATE: 07/05/23 Neuromuscular re-ed: Positional testing L sidelying test + for upbeating, rotary nystagmus to the left lasting 10 second duration R sidelying test - for nystagmus Bilat horizontal roll test - for nystagmus Canolith repositioning L epley's x 2 reps, with symptoms  resolved on 2nd rep Manual Therapy: STM bilat bilat scalenes, upper trap, cervical multifidi Trigger Point Dry-Needling  Treatment instructions: Expect mild to moderate muscle soreness. Patient verbalized understanding of these instructions and education. Patient Consent Given: Yes Education handout provided: Previously provided Muscles treated: bilat suboccipitals, bilat upper traps Treatment response/outcome: palpable lengthening Self Care: Self epley's handout  [facility] Adult PT Treatment:                                                DATE: 06/19/23 Therapeutic Exercise: Standing Overhead press x 10 reps 2# bilat Single arm press x R and L x 10 reps 2# Shoulder ER with red band x 15 reps  D/c'd rowing from HEP with band due to pain Bent over rows x 2# x 10 reps Manual Therapy: STM bilat suboccipitals and bilat scalenes, upper trap, cervical multifidi Trigger Point Dry-Needling  Treatment instructions: Expect mild to moderate muscle soreness. Patient verbalized understanding of these instructions and education. Patient Consent Given: Yes Education handout provided: Previously provided Muscles treated: bilat suboccipitals, bilat upper traps Treatment response/outcome: palpable lengthening Self Care: Discussed gym routine and return to PT after 2-3 weeks to allow time to start routine.  PATIENT EDUCATION: Education details: nature of condition, plan for treatment Person educated: Patient Education method: Explanation Education comprehension: verbalized understanding  HOME EXERCISE PROGRAM: Access Code: [email or web address] URL: [email or web address] Date: 07/05/2023 Prepared by: Margretta Ditty  Program Notes [facility]  on you tube: Epley maneuver to treat BPPV (you would start on the left side) GYM EXERCISES TO CONSIDER:seated row machine, lat pulls, overhead press with dumb bells  Exercises - Right  Sidelying to Left Sidelying Vestibular Habituation  - 2 x daily - 7  x weekly - 1 sets - 5 reps - Brandt-Daroff Vestibular Exercise  - 2 x daily - 7 x weekly - 1 sets - 5 reps - Prone Scapular Slide with Shoulder Extension  - 1 x daily - 7 x weekly - 1 sets - 10 reps - Sidelying Thoracic Rotation with Open Book  - 1 x daily - 7 x weekly - 1 sets - 10 reps - Sternocleidomastoid Stretch  - 1 x daily - 7 x weekly - 1 sets - 3 reps - 30 seconds hold - Seated Levator Scapulae Stretch  - 1 x daily - 7 x weekly - 1 sets - 3 reps - 30 seconds hold - Seated Cervical Sidebending Stretch  - 1 x daily - 7 x weekly - 1 sets - 3 reps - 30 seconds hold - Supine Anterior Scalene Stretch  - 2 x daily - 7 x weekly - 1 sets - 3 reps - 30 seconds hold - Standing Single Arm Shoulder Press with Dumbbell  - 1 x daily - 3 x weekly - 2 sets - 15 reps - Shoulder External Rotation and Scapular Retraction with Resistance  - 1 x daily - 7 x weekly - 1 sets - 10 reps  Patient Education - Left Epley Maneuver  GOALS: Goals reviewed with patient? Yes  UPDATED  GOALS: SHORT TERM GOALS: Target date: 08/04/23  The patient will be indep with self management of vertigo (home epley's) due to recurring symptoms. Baseline: provided handout and instructions today Goal status:MET  LONG TERM GOALS: Target date: 09/02/23  The patient will report bed mobility without episodes of spinning. Baseline:  L BPPV returned Goal status: MET  2.  The patient will have negative positional testing. Baseline:  + L BPPV Goal status:UPDATED  3.  The patient will report 50% reduction in neck tightness.   Baseline:  to review home treatment for future mgmt Goal status: continue   ASSESSMENT: CLINICAL IMPRESSION: The patient does not have vertigo today-- it has been improved since last session. She does continue with neck pain and fatigues with HEP. We discussed continuing HEP at lower # of reps and to tolerance.   EVAL: Patient is a 67 y.o. female who was seen today for physical therapy evaluation and  treatment for vertigo. She is known to me from prior treatment for BPPV and vestibular issues. She presents today with + L BPPV initially noted with rotary nystagmus with L horizontal roll. She responded well to L epley's x 2 reps today. She also does get imbalance and HA at times-- PT to monitor and adjust treatment as needed.    OBJECTIVE IMPAIRMENTS: decreased balance and dizziness.   PLAN:  PT FREQUENCY: 1x/week  PT DURATION: 8 weeks  PLANNED INTERVENTIONS: Therapeutic exercises, Therapeutic activity, Neuromuscular re-education, Balance training, Gait training, Patient/Family education, Self Care, Joint mobilization, Vestibular training, Canalith repositioning, Dry Needling, and Manual therapy  PLAN FOR NEXT SESSION:  continue working to [employer] with focus on home management.   Vollie Brunty, PT 07/31/2023, 10:22 AM

## 2023-08-01 ENCOUNTER — Other Ambulatory Visit (INDEPENDENT_AMBULATORY_CARE_PROVIDER_SITE_OTHER): Payer: Self-pay | Admitting: Family Medicine

## 2023-08-01 DIAGNOSIS — E669 Obesity, unspecified: Secondary | ICD-10-CM

## 2023-08-10 ENCOUNTER — Other Ambulatory Visit (HOSPITAL_COMMUNITY): Payer: Self-pay | Admitting: Psychiatry

## 2023-08-14 ENCOUNTER — Ambulatory Visit: Payer: Medicare Other | Admitting: Rehabilitative and Restorative Service Providers"

## 2023-08-21 ENCOUNTER — Ambulatory Visit: Payer: Medicare Other | Admitting: Rehabilitative and Restorative Service Providers"

## 2023-08-22 ENCOUNTER — Ambulatory Visit (INDEPENDENT_AMBULATORY_CARE_PROVIDER_SITE_OTHER): Payer: Medicare Other | Admitting: Family Medicine

## 2023-08-22 ENCOUNTER — Encounter (INDEPENDENT_AMBULATORY_CARE_PROVIDER_SITE_OTHER): Payer: Self-pay

## 2023-09-04 ENCOUNTER — Other Ambulatory Visit (INDEPENDENT_AMBULATORY_CARE_PROVIDER_SITE_OTHER): Payer: Self-pay | Admitting: Family Medicine

## 2023-09-04 DIAGNOSIS — E559 Vitamin D deficiency, unspecified: Secondary | ICD-10-CM

## 2023-09-07 DIAGNOSIS — M1812 Unilateral primary osteoarthritis of first carpometacarpal joint, left hand: Secondary | ICD-10-CM | POA: Insufficient documentation

## 2023-09-07 HISTORY — DX: Unilateral primary osteoarthritis of first carpometacarpal joint, left hand: M18.12

## 2023-09-10 ENCOUNTER — Ambulatory Visit (INDEPENDENT_AMBULATORY_CARE_PROVIDER_SITE_OTHER): Payer: Medicare Other | Admitting: Family Medicine

## 2023-09-10 ENCOUNTER — Encounter (INDEPENDENT_AMBULATORY_CARE_PROVIDER_SITE_OTHER): Payer: Self-pay | Admitting: Family Medicine

## 2023-09-10 VITALS — BP 109/75 | HR 106 | Temp 98.0°F | Ht 66.0 in | Wt 164.0 lb

## 2023-09-10 DIAGNOSIS — Z6826 Body mass index (BMI) 26.0-26.9, adult: Secondary | ICD-10-CM

## 2023-09-10 DIAGNOSIS — E669 Obesity, unspecified: Secondary | ICD-10-CM | POA: Diagnosis not present

## 2023-09-10 DIAGNOSIS — E559 Vitamin D deficiency, unspecified: Secondary | ICD-10-CM

## 2023-09-10 MED ORDER — ZEPBOUND 2.5 MG/0.5ML ~~LOC~~ SOLN: 2.5000 mg | SUBCUTANEOUS | 0 refills | Status: DC

## 2023-09-10 NOTE — Progress Notes (Unsigned)
.smr  Office: [phone]  /  Fax: [phone]  WEIGHT SUMMARY AND BIOMETRICS  Anthropometric Measurements Height: 5\' 6"  (1.676 m) Weight: 164 lb (74.4 kg) BMI (Calculated): 26.48 Weight at Last Visit: 168 lb Weight Lost Since Last Visit: 4 lb Starting Weight: 199 lb Total Weight Loss (lbs): 35 lb (15.9 kg)   Body Composition  Body Fat %: 41.7 % Fat Mass (lbs): 68.4 lbs Muscle Mass (lbs): 90.8 lbs Total Body Water (lbs): 69.2 lbs Visceral Fat Rating : 10   Other Clinical Data Fasting: No Labs: No Today's Visit #: 15 Starting Date: 12/11/22    Chief Complaint: OBESITY   History of Present Illness   The patient, currently on Zepbound for obesity and prescription vitamin D for vitamin D deficiency, presents for a follow-up consultation. Over the holiday period, she managed to lose four pounds, despite admitting to only adhering to her category one eating plan about fifty percent of the time due to holiday indulgences.  The patient has been documenting her weight with each Zepbound injection for the past two months and has noticed a steady decrease. She initially experienced stomach pains with the medication, leading to a reduction in frequency to once every other week. However, as the stomach pains subsided, she increased the frequency to once every one and a half weeks.  In addition to her weight loss efforts, the patient has expressed a desire to increase her physical strength, specifically her ability to perform squats and rise from the floor without assistance.   The patient's overall progress with her weight loss has been positive, exceeding expectations, especially considering the holiday period.          PHYSICAL EXAM:  Blood pressure 109/75, pulse (!) 106, temperature 98 F (36.7 C), height 5\' 6"  (1.676 m), weight 164 lb (74.4 kg), SpO2 95%. Body mass index is 26.47 kg/m.  DIAGNOSTIC DATA REVIEWED:  BMET    Component Value Date/Time   NA 138  12/11/2022 1105   K 4.0 12/11/2022 1105   CL 103 12/11/2022 1105   CO2 19 (L) 12/11/2022 1105   GLUCOSE 84 12/11/2022 1105   GLUCOSE 93 07/05/2019 1629   BUN 16 12/11/2022 1105   CREATININE 0.88 12/11/2022 1105   CREATININE 0.74 02/24/2016 1158   CALCIUM 9.4 12/11/2022 1105   GFRNONAA 65 07/24/2019 1116   GFRNONAA 88 02/24/2016 1158   GFRAA 75 07/24/2019 1116   GFRAA >89 02/24/2016 1158   Lab Results  Component Value Date   HGBA1C 5.3 12/11/2022   HGBA1C 5.3 03/22/2017   Lab Results  Component Value Date   INSULIN 7.8 12/11/2022   INSULIN 10.5 03/22/2017   Lab Results  Component Value Date   TSH 1.970 05/17/2023   CBC    Component Value Date/Time   WBC 6.4 08/04/2020 0000   WBC 6.6 07/05/2019 1629   RBC 4.26 08/04/2020 0000   HGB 13.5 08/04/2020 0000   HGB 12.6 05/07/2018 1057   HCT 41 08/04/2020 0000   HCT 38.8 05/07/2018 1057   PLT 262 08/04/2020 0000   MCV 95.0 07/05/2019 1629   MCV 90 05/07/2018 1057   MCH 30.0 07/05/2019 1629   MCHC 31.6 07/05/2019 1629   RDW 13.8 07/05/2019 1629   RDW 14.0 05/07/2018 1057   Iron Studies No results found for: "IRON", "TIBC", "FERRITIN", "IRONPCTSAT" Lipid Panel     Component Value Date/Time   CHOL 176 12/11/2022 1105   TRIG 117 12/11/2022 1105   HDL 54 12/11/2022 1105  CHOLHDL 3.3 10/11/2021 1142   CHOLHDL 3.9 12/14/2014 1036   VLDL 26 12/14/2014 1036   LDLCALC 101 (H) 12/11/2022 1105   Hepatic Function Panel     Component Value Date/Time   PROT 6.5 12/11/2022 1105   ALBUMIN 4.5 12/11/2022 1105   AST 19 12/11/2022 1105   ALT 20 12/11/2022 1105   ALKPHOS 81 12/11/2022 1105   BILITOT 0.3 12/11/2022 1105   BILIDIR 0.14 02/12/2020 1056      Component Value Date/Time   TSH 1.970 05/17/2023 1627   Nutritional Lab Results  Component Value Date   VD25OH 41.2 12/11/2022   VD25OH 43.3 05/07/2018   VD25OH 48.5 07/04/2017     Assessment and Plan    Obesity On Zepbound for obesity management. Lost 4  pounds over the holiday season, better than expected. Following category one eating plan about 50% of the time. Experienced stomach pains with medication, improved with dosing interval adjusted to once every other week and then to every one and a half weeks. Considering increasing dose to 0.5 mg weekly for better hunger control in the future if this adjustment is not enough. Discussed risks of nausea with increased dose and benefits of improved hunger control. Explained that the first week of each dose provides the best hunger control, lasting in the system for about four weeks. - Increase Zepbound dose to 0.5 mg weekly - Refill Zepbound prescription - Encourage continuation of category one eating plan - Recommend starting physical strengthening exercises, especially targeting muscles for functional movements like squatting, targeting exercise videos for functional strengthening over 50  Vitamin D Deficiency On prescription vitamin D. Due for a vitamin D level check to ensure not over replaced. Explained risks of over replacement, including potential nerve tissue problems if levels are above 100 ng/mL. Discussed that if levels are close to 100 ng/mL, supplementation should be stopped. - Check vitamin D levels - If vitamin D level is above 100 ng/mL or close to it, stop vitamin D supplementation - Continue current vitamin D supplementation if levels are below 100 ng/mL  Follow-up - Schedule next appointment in January - Follow up with lab results via MyChart.       She was informed of the importance of frequent follow up visits to maximize her success with intensive lifestyle modifications for her multiple health conditions.    Quillian Quince, MD

## 2023-09-11 ENCOUNTER — Encounter (INDEPENDENT_AMBULATORY_CARE_PROVIDER_SITE_OTHER): Payer: Self-pay | Admitting: Family Medicine

## 2023-09-11 LAB — LIPID PANEL WITH LDL/HDL RATIO
Cholesterol, Total: 166 mg/dL (ref 100–199)
HDL: 51 mg/dL (ref >39)
LDL Chol Calc (NIH): 97 mg/dL (ref 0–99)
LDL/HDL Ratio: 1.9 {ratio} (ref 0.0–3.2)
Triglycerides: 95 mg/dL (ref 0–149)
VLDL Cholesterol Cal: 18 mg/dL (ref 5–40)

## 2023-09-11 LAB — CMP14+EGFR
ALT: 21 [IU]/L (ref 0–32)
AST: 21 [IU]/L (ref 0–40)
Albumin: 4.3 g/dL (ref 3.9–4.9)
Alkaline Phosphatase: 84 [IU]/L (ref 44–121)
BUN/Creatinine Ratio: 28 (ref 12–28)
BUN: 20 mg/dL (ref 8–27)
Bilirubin Total: 0.3 mg/dL (ref 0.0–1.2)
CO2: 22 mmol/L (ref 20–29)
Calcium: 9.3 mg/dL (ref 8.7–10.3)
Chloride: 107 mmol/L — ABNORMAL HIGH (ref 96–106)
Creatinine, Ser: 0.71 mg/dL (ref 0.57–1.00)
Globulin, Total: 2.3 g/dL (ref 1.5–4.5)
Glucose: 79 mg/dL (ref 70–99)
Potassium: 4.2 mmol/L (ref 3.5–5.2)
Sodium: 144 mmol/L (ref 134–144)
Total Protein: 6.6 g/dL (ref 6.0–8.5)
eGFR: 93 mL/min/{1.73_m2} (ref >59)

## 2023-09-11 LAB — HEMOGLOBIN A1C
Est. average glucose Bld gHb Est-mCnc: 103 mg/dL
Hgb A1c MFr Bld: 5.2 % (ref 4.8–5.6)

## 2023-09-11 LAB — TSH: TSH: 5.49 u[IU]/mL — ABNORMAL HIGH (ref 0.450–4.500)

## 2023-09-11 LAB — VITAMIN B12: Vitamin B-12: 385 pg/mL (ref 232–1245)

## 2023-09-11 LAB — VITAMIN D 25 HYDROXY (VIT D DEFICIENCY, FRACTURES): Vit D, 25-Hydroxy: 103 ng/mL — ABNORMAL HIGH (ref 30.0–100.0)

## 2023-09-11 LAB — INSULIN, RANDOM: INSULIN: 8 u[IU]/mL (ref 2.6–24.9)

## 2023-09-13 NOTE — Telephone Encounter (Signed)
**Note De-identified  [place] Obfuscation** Please advise 

## 2023-09-18 ENCOUNTER — Other Ambulatory Visit: Payer: Self-pay | Admitting: Family Medicine

## 2023-09-18 DIAGNOSIS — N763 Subacute and chronic vulvitis: Secondary | ICD-10-CM

## 2023-09-26 NOTE — Progress Notes (Signed)
 Marland Kitchen

## 2023-09-26 NOTE — Patient Instructions (Addendum)
Below is our plan:  We will see if we can get Vyepti infusions covered. Continue Emgality for now but will plan to stop once we get Vyepti approved. Continue rizatriptan for now.   We are giving you a toradol shot today. Please hold all other nsaids including meloxicam for 24 hours   Please continue using your CPAP regularly. While your insurance requires that you use CPAP at least 4 hours each night on 70% of the nights, I recommend, that you not skip any nights and use it throughout the night if you can. Getting used to CPAP and staying with the treatment long term does take time and patience and discipline. Untreated obstructive sleep apnea when it is moderate to severe can have an adverse impact on cardiovascular health and raise her risk for heart disease, arrhythmias, hypertension, congestive heart failure, stroke and diabetes. Untreated obstructive sleep apnea causes sleep disruption, nonrestorative sleep, and sleep deprivation. This can have an impact on your day to day functioning and cause daytime sleepiness and impairment of cognitive function, memory loss, mood disturbance, and problems focussing. Using CPAP regularly can improve these symptoms.  Please make sure you are staying well hydrated. I recommend 50-60 ounces daily. Well balanced diet and regular exercise encouraged. Consistent sleep schedule with 6-8 hours recommended.   Please continue follow up with care team as directed.   Follow up with me in 6 months   You may receive a survey regarding today's visit. I encourage you to leave honest feed back as I do use this information to improve patient care. Thank you for seeing me today!   GENERAL HEADACHE INFORMATION:   Natural supplements: Magnesium Oxide or Magnesium Glycinate 500 mg at bed (up to 800 mg daily) Coenzyme Q10 300 mg in AM Vitamin B2- 200 mg twice a day   Add 1 supplement at a time since even natural supplements can have undesirable side effects. You can  sometimes buy supplements cheaper (especially Coenzyme Q10) at www.WebmailGuide.co.za or at [facility].  Migraine with aura: There is increased risk for stroke in women with migraine with aura and a contraindication for the combined contraceptive pill for use by women who have migraine with aura. The risk for women with migraine without aura is lower. However other risk factors like smoking are far more likely to increase stroke risk than migraine. There is a recommendation for no smoking and for the use of OCPs without estrogen such as progestogen only pills particularly for women with migraine with aura.Marland Kitchen People who have migraine headaches with auras may be 3 times more likely to have a stroke caused by a blood clot, compared to migraine patients who don't see auras. Women who take hormone-replacement therapy may be 30 percent more likely to suffer a clot-based stroke than women not taking medication containing estrogen. Other risk factors like smoking and high blood pressure may be  much more important.    Vitamins and herbs that show potential:   Magnesium: Magnesium (250 mg twice a day or 500 mg at bed) has a relaxant effect on smooth muscles such as blood vessels. Individuals suffering from frequent or daily headache usually have low magnesium levels which can be increase with daily supplementation of 400-750 mg. Three trials found 40-90% average headache reduction  when used as a preventative. Magnesium may help with headaches are aura, the best evidence for magnesium is for migraine with aura is its thought to stop the cortical spreading depression we believe is the pathophysiology of migraine  aura.Magnesium also demonstrated the benefit in menstrually related migraine.  Magnesium is part of the messenger system in the serotonin cascade and it is a good muscle relaxant.  It is also useful for constipation which can be a side effect of other medications used to treat migraine. Good sources include nuts, whole  grains, and tomatoes. Side Effects: loose stool/diarrhea  Riboflavin (vitamin B 2) 200 mg twice a day. This vitamin assists nerve cells in the production of ATP a principal energy storing molecule.  It is necessary for many chemical reactions in the body.  There have been at least 3 clinical trials of riboflavin using 400 mg per day all of which suggested that migraine frequency can be decreased.  All 3 trials showed significant improvement in over half of migraine sufferers.  The supplement is found in bread, cereal, milk, meat, and poultry.  Most Americans get more riboflavin than the recommended daily allowance, however riboflavin deficiency is not necessary for the supplements to help prevent headache. Side effects: energizing, green urine   Coenzyme Q10: This is present in almost all cells in the body and is critical component for the conversion of energy.  Recent studies have shown that a nutritional supplement of CoQ10 can reduce the frequency of migraine attacks by improving the energy production of cells as with riboflavin.  Doses of 150 mg twice a day have been shown to be effective.   Melatonin: Increasing evidence shows correlation between melatonin secretion and headache conditions.  Melatonin supplementation has decreased headache intensity and duration.  It is widely used as a sleep aid.  Sleep is natures way of dealing with migraine.  A dose of 3 mg is recommended to start for headaches including cluster headache. Higher doses up to 15 mg has been reviewed for use in Cluster headache and have been used. The rationale behind using melatonin for cluster is that many theories regarding the cause of Cluster headache center around the disruption of the normal circadian rhythm in the brain.  This helps restore the normal circadian rhythm.   HEADACHE DIET: Foods and beverages which may trigger migraine Note that only 20% of headache patients are food sensitive. You will know if you are food  sensitive if you get a headache consistently 20 minutes to 2 hours after eating a certain food. Only cut out a food if it causes headaches, otherwise you might remove foods you enjoy! What matters most for diet is to eat a well balanced healthy diet full of vegetables and low fat protein, and to not miss meals.   Chocolate, other sweets ALL cheeses except cottage and cream cheese Dairy products, yogurt, sour cream, ice cream Liver Meat extracts (Bovril, Marmite, meat tenderizers) Meats or fish which have undergone aging, fermenting, pickling or smoking. These include: Hotdogs,salami,Lox,sausage, mortadellas,smoked salmon, pepperoni, Pickled herring Pods of broad bean (English beans, Chinese pea pods, [place] (fava) beans, lima and navy beans Ripe avocado, ripe banana Yeast extracts or active yeast preparations such as Brewer's or Fleishman's (commercial bakes goods are permitted) Tomato based foods, pizza (lasagna, etc.)   MSG (monosodium glutamate) is disguised as many things; look for these common aliases: Monopotassium glutamate Autolysed yeast Hydrolysed protein Sodium caseinate "flavorings" "all natural preservatives" Nutrasweet   Avoid all other foods that convincingly provoke headaches.   Resources: The Dizzy [place] Laundry Your Headache Diet, migrainestrong.com  https://zamora-andrews.com/   Caffeine and Migraine For patients that have migraine, caffeine intake more than 3 days per week can lead to dependency and  increased migraine frequency. I would recommend cutting back on your caffeine intake as best you can. The recommended amount of caffeine is 200-300 mg daily, although migraine patients may experience dependency at even lower doses. While you may notice an increase in headache temporarily, cutting back will be helpful for headaches in the long run. For more information on caffeine and migraine, visit:  https://americanmigrainefoundation.org/resource-library/caffeine-and-migraine/   Headache Prevention Strategies:   1. Maintain a headache diary; learn to identify and avoid triggers.  - This can be a simple note where you log when you had a headache, associated symptoms, and medications used - There are several smartphone apps developed to help track migraines: Migraine Buddy, Migraine Monitor, Curelator N1-Headache App   Common triggers include: Emotional triggers: Emotional/Upset family or friends Emotional/Upset occupation Business reversal/success Anticipation anxiety Crisis-serious Post-crisis periodNew job/position   Physical triggers: Vacation Day Weekend Strenuous Exercise High Altitude Location New Move Menstrual Day Physical Illness Oversleep/Not enough sleep Weather changes Light: Photophobia or light sesnitivity treatment involves a balance between desensitization and reduction in overly strong input. Use dark polarized glasses outside, but not inside. Avoid bright or fluorescent light, but do not dim environment to the point that going into a normally lit room hurts. Consider FL-41 tint lenses, which reduce the most irritating wavelengths without blocking too much light.  These can be obtained at axonoptics.com or theraspecs.com Foods: see list above.   2. Limit use of acute treatments (over-the-counter medications, triptans, etc.) to no more than 2 days per week or 10 days per month to prevent medication overuse headache (rebound headache).     3. Follow a regular schedule (including weekends and holidays): Don't skip meals. Eat a balanced diet. 8 hours of sleep nightly. Minimize stress. Exercise 30 minutes per day. Being overweight is associated with a 5 times increased risk of chronic migraine. Keep well hydrated and drink 6-8 glasses of water per day.   4. Initiate non-pharmacologic measures at the earliest onset of your headache. Rest and quiet  environment. Relax and reduce stress. Breathe2Relax is a free app that can instruct you on    some simple relaxtion and breathing techniques. Http://Dawnbuse.com is a    free website that provides teaching videos on relaxation.  Also, there are  many apps that   can be downloaded for "mindful" relaxation.  An app called YOGA NIDRA will help walk you through mindfulness. Another app called Calm can be downloaded to give you a structured mindfulness guide with daily reminders and skill development. Headspace for guided meditation Mindfulness Based Stress Reduction Online Course: www.palousemindfulness.com Cold compresses.   5. Don't wait!! Take the maximum allowable dosage of prescribed medication at the first sign of migraine.   6. Compliance:  Take prescribed medication regularly as directed and at the first sign of a migraine.   7. Communicate:  Call your physician when problems arise, especially if your headaches change, increase in frequency/severity, or become associated with neurological symptoms (weakness, numbness, slurred speech, etc.). Proceed to emergency room if you experience new or worsening symptoms or symptoms do not resolve, if you have new neurologic symptoms or if headache is severe, or for any concerning symptom.   8. Headache/pain management therapies: Consider various complementary methods, including medication, behavioral therapy, psychological counselling, biofeedback, massage therapy, acupuncture, dry needling, and other modalities.  Such measures may reduce the need for medications. Counseling for pain management, where patients learn to function and ignore/minimize their pain, seems to work very well.   9. Recommend  changing family's attention and focus away from patient's headaches. Instead, emphasize daily activities. If first question of day is 'How are your headaches/Do you have a headache today?', then patient will constantly think about headaches, thus making them worse.  Goal is to re-direct attention away from headaches, toward daily activities and other distractions.   10. Helpful Websites: www.AmericanHeadacheSociety.org PatentHood.ch www.headaches.org TightMarket.nl www.achenet.org

## 2023-09-26 NOTE — Progress Notes (Signed)
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 DCR, K. Supple MD   Bladder dilitation     x 3   BREAST BIOPSY Right 2011   Benign histology   CARDIOVASCULAR STRESS TEST  2000   Unremarkable per pt  report   CARPOMETACARPAL JOINT ARTHROTOMY Right 2011   COLONOSCOPY     COLONOSCOPY WITH PROPOFOL N/A 04/21/2015   Procedure: COLONOSCOPY WITH PROPOFOL;  Surgeon: Jeani Hawking, MD;  Location: [name];  Service: Endoscopy;  Laterality: N/A;   CYSTOSCOPY W/ DILATION OF BLADDER N/A    EPIDURAL BLOCK INJECTION Left 04/12/2016   Left Medial Nerve Block and Left L5 ramus block, Dr Sheran Luz    EPIDURAL BLOCK INJECTION  03/21/2016   Left L3-4 medial branch block and Left L5 & dorsal ramus block    EPIDURAL BLOCK INJECTION N/A 10/25/2016   Sheran Luz, MD. Lumbar medial branch block   EPIDURAL BLOCK INJECTION N/A 02/09/2017   Sheran Luz, MD.  Bilateral L3/4 medial branch block, bilateral L5 dorsal ramus block   EPIDURAL BLOCK INJECTION N/A 07/04/2017   Sheran Luz, MD   EXCISION MORTON'S NEUROMA Right 05/03/2023   Procedure: EXCISION MORTON'S NEUROMA;  Surgeon: Toni Arthurs, MD;  Location: [facility];  Service: Orthopedics;  Laterality: Right;  general, local by surgeon 60 MIN    FECAL TRANSPLANT  04/21/2015   Procedure: FECAL TRANSPLANT;  Surgeon: Jeani Hawking, MD;  Location: [facility] ENDOSCOPY;  Service: Endoscopy;;   HIP ARTHROPLASTY Left    HIP ARTHROSCOPY Left 03/06/2018   Left hip arthroplasty, redo for loose hip arthroplasty. Procedure at [facility] hospital   INJECTION HIP INTRA ARTICULAR Left 11/2015   for OA by Dr Vilma Prader IMPLANT PLACEMENT  04/2021   Floyde Parkins MD ([facility])   INTERSTIM IMPLANT REVISION N/A 09/2021   Floyde Parkins MD ([facility])   LIGAMENT REPAIR Right 05/03/2023   Procedure: COLLATERAL LIGAMENT REPAIR;  Surgeon: Toni Arthurs, MD;  Location: [facility];  Service: Orthopedics;  Laterality: Right;  general, local by surgeon 60 MIN   OTHER SURGICAL HISTORY Left 2016   Left L3/L4 medial nerve block and Left L5 Dorsal Ramus block Dr Rosine Abe   TOTAL HIP ARTHROPLASTY Left 07/25/2016   Procedure: LEFT TOTAL HIP ARTHROPLASTY ANTERIOR APPROACH;  Surgeon: Durene Romans, MD;  Location: [facility];  Service: Orthopedics;  Laterality: Left;   WEIL OSTEOTOMY Right 05/03/2023   Procedure: RIGHT 2ND WEIL OSTEOTOMY;  Surgeon: Toni Arthurs, MD;  Location: [facility];  Service: Orthopedics;  Laterality: Right;  general, local by surgeon 60 MIN    FAMILY HISTORY: Family History  Problem Relation Age of Onset   Alzheimer's disease Mother    Hyperlipidemia Mother    Hypertension Mother    Osteoporosis Mother    Parkinson's disease Mother    Migraines Sister    Allergies Sister    Hypertension Sister    Hyperlipidemia Brother    Cardiomyopathy Brother    Diabetes type II Brother    Kidney disease Brother    Asthma Brother    Heart disease Brother    Hypertension Brother    Hyperlipidemia Brother    Kidney disease Brother    Heart disease Brother    Heart disease Father    Hyperlipidemia Father    Hypertension Father    Aortic aneurysm Father    Early death Father 93   Diabetes type  II Other    Breast cancer Maternal Aunt     SOCIAL HISTORY: Social History   Socioeconomic History   Marital status: Married    Spouse name: Anslee Bobek   Number of children: 1   Years of education: 16   Highest education  level: Bachelor's degree (e.g., BA, AB, BS)  Occupational History   Occupation: Dietitian: [employer]    Comment: Retired   Occupation: Database administrator: [employer]    Comment: Part-time  Tobacco Use   Smoking status: Never    Passive exposure: Past   Smokeless tobacco: Never  Vaping Use   Vaping status: Never Used  Substance and Sexual Activity   Alcohol use: No    Alcohol/week: 0.0 standard drinks of alcohol    Comment: No use in 30 years.   Drug use: No   Sexual activity: Yes    Partners: Male    Birth control/protection: None  Other Topics Concern   Not on file  Social History Narrative   Prior PCP With [facility] [place], [place].   Married, lives with [place]illa, (b. 1954)   Son in [place]. (2) grandchildren.   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    Wears seatbelt usually   No religious beliefs affecting healthcare   No difficulty taking medications as directed.       Home has working smoke alarm   No home throw rugs   Does not have nonslip bathtub / shower    Has railings on all stairs   Home is free from Clutter.    Right-handed.      Best number to reach patient [phone] (M) as of 09/21/2021.    No regular exercise of 3 times a week for 30 minutes at a time      Anisten Gulyas has Scientist, water quality and a Sports administrator is husband, Adesuwa Sturm [phone]      Caffeine: 2-3 cups coffee per day   Social Drivers of Corporate investment banker Strain: Low Risk  (03/16/2023)   Overall Financial Resource Strain (CARDIA)    Difficulty of Paying Living Expenses: Not hard at all  Food  Insecurity: No Food Insecurity (03/16/2023)   Hunger Vital Sign    Worried About Running Out of Food in the Last Year: Never true    Ran Out of Food in the Last Year: Never true  Transportation Needs: No Transportation Needs (03/16/2023)   PRAPARE - Administrator, Civil Service (Medical): No    Lack of Transportation (Non-Medical): No  Physical Activity: Inactive (03/16/2023)   Exercise Vital Sign    Days of Exercise per Week: 0 days    Minutes of Exercise per Session: 0 min  Stress: No Stress Concern Present (03/16/2023)   [employer] of Occupational Health - Occupational Stress Questionnaire    Feeling of Stress : Only a little  Social Connections: Moderately Integrated (03/16/2023)   Social Connection and Isolation Panel [NHANES]    Frequency of Communication with Friends and Family: Once a week    Frequency of Social Gatherings with Friends and Family: Twice a week    Attends Religious Services: Never    Database administrator or Organizations: Yes    Attends Engineer, structural: More than 4 times per year    Marital Status: Married  Catering manager Violence: Not At Risk (12/07/2022)   Humiliation, Afraid, Rape, and Kick questionnaire    Fear of Current or Ex-Partner: No    Emotionally Abused: No    Physically Abused: No    Sexually Abused: No      PHYSICAL  EXAM  Vitals:   09/27/23 1351  BP: 107/76  Pulse: (!) 102  Weight: 165 lb (74.8 kg)  Height: 5\' 6"  (1.676 m)       Body mass index is 26.63 kg/m.  Repeat BP 140/88 manually in office.   Generalized: Well developed, in no acute distress  Cardiology: normal rate and rhythm, no murmur noted Respiratory: clear to auscultation bilaterally  Neurological examination  Mentation: Alert oriented to time, place, history taking. Follows all commands speech and language fluent Cranial nerve II-XII: Pupils were equal round reactive to light. Extraocular movements were full, visual field were full   Motor: The motor testing reveals 5 over 5 strength of all 4 extremities. Good symmetric motor tone is noted throughout.  Gait and station: Gait is normal.    DIAGNOSTIC DATA (LABS, IMAGING, TESTING) - I reviewed patient records, labs, notes, testing and imaging myself where available.     02/25/2016   12:53 PM  MMSE - Mini Mental State Exam  Orientation to time --  Orientation to time comments Testing: Ms Lindaman named 18 different animal types in 50 seconds WNL     Lab Results  Component Value Date   WBC 6.4 08/04/2020   HGB 13.5 08/04/2020   HCT 41 08/04/2020   MCV 95.0 07/05/2019   PLT 262 08/04/2020      Component Value Date/Time   NA 144 09/10/2023 0731   K 4.2 09/10/2023 0731   CL 107 (H) 09/10/2023 0731   CO2 22 09/10/2023 0731   GLUCOSE 79 09/10/2023 0731   GLUCOSE 93 07/05/2019 1629   BUN 20 09/10/2023 0731   CREATININE 0.71 09/10/2023 0731   CREATININE 0.74 02/24/2016 1158   CALCIUM 9.3 09/10/2023 0731   PROT 6.6 09/10/2023 0731   ALBUMIN 4.3 09/10/2023 0731   AST 21 09/10/2023 0731   ALT 21 09/10/2023 0731   ALKPHOS 84 09/10/2023 0731   BILITOT 0.3 09/10/2023 0731   GFRNONAA 65 07/24/2019 1116   GFRNONAA 88 02/24/2016 1158   GFRAA 75 07/24/2019 1116   GFRAA >89 02/24/2016 1158   Lab Results  Component Value Date   CHOL 166 09/10/2023   HDL 51 09/10/2023   LDLCALC 97 09/10/2023   TRIG 95 09/10/2023   CHOLHDL 3.3 10/11/2021   Lab Results  Component Value Date   HGBA1C 5.2 09/10/2023   Lab Results  Component Value Date   VITAMINB12 385 09/10/2023   Lab Results  Component Value Date   TSH 5.490 (H) 09/10/2023       ASSESSMENT AND PLAN 68 y.o. year old female  has a past medical history of Abdominal bloating (05/10/2021), Abdominal discomfort, generalized (05/07/2016), Acute gastritis (11/19/2020), Allergic rhinoconjunctivitis (07/02/2015), Anal fissure (11/19/2020), Anterior to posterior tear of superior glenoid labrum of left shoulder  (02/22/2021), Arthritis, Benign positional vertigo (04/2015), Bruxism (teeth grinding), Chronic contact dermatitis (08/06/2018), Chronic migraine (02/25/2016), Chronic migraine without aura (05/15/2013), Chronic tonsillitis (01/27/2019), DDD (degenerative disc disease), cervical (11/18/2018), DDD (degenerative disc disease), lumbar, Depression, Disc displacement, lumbar, Dysphagia (10/11/2021), Dysrhythmia, Episodic cluster headache, not intractable (03/06/2017), Essential hypertension (05/15/2013), Family history of adverse reaction to anesthesia, Family history of premature CAD (05/15/2013), Fibromyalgia syndrome (07/01/2015), GERD (gastroesophageal reflux disease) (12/16/2014), H/O seasonal allergies, Hallux rigidus of right foot (09/25/2022), Hammer toe, Hashimoto's thyroiditis, Hearing loss of both ears (07/27/2015), History of Clostridium difficile colitis (07/01/2015), History of colonic polyps, 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), History of revision of total hip arthroplasty (04/10/2018), bad fall (02/2015), Hyperhidrosis, scalp, 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), Hyperlipidemia (1998), Hypokalemia due to excessive gastrointestinal loss of potassium (07/28/2019), Hypothyroidism, Iliopsoas bursitis of left hip (05/08/2022), Impairment of balance (02/2015), Injury of triangular fibrocartilage complex of left wrist (02/25/2019), Insulin resistance (07/04/2017), Internal hemorrhoid (01/28/2021), Interstitial cystitis, Irritable bowel syndrome with diarrhea (04/03/2016), Left ventricular hypertrophy, mild (02/25/2016), 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HCC) (03/10/2018), Lumbar facet joint pain, 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), Mood disorder (HCC), Morbid obesity (HCC) (03/06/2017), Musculoskeletal neck pain (07/14/2015), Nocturnal hypoxemia (03/06/2017), Normal coronary arteries (05/14/2014), Obesity (BMI 30.0-34.9) (01/29/2017), Odynophagia (12/20/2021), Osteoarthritis of left hip (11/01/2015),  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, lumbar region (10/30/2011), Other insomnia (11/09/2016), Overweight, Pain in joint of left shoulder (11/09/2016), Pain in joint, multiple sites (11/18/2018), Palpitations (05/15/2013), Perianal candidiasis (12/20/2021), 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), Perirectal cyst (05/07/2016), Poison ivy dermatitis (03/24/2021), PONV (postoperative nausea and vomiting), Positive ANA (antinuclear antibody) (11/18/2018), Post concussion syndrome (06/06/2015), Post concussive syndrome (07/14/2015), Posterior vitreous detachment of right eye (2014), Premature menopause (12/20/2021), Rectal abscess (05/10/2021), S/P left THA, 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), Sacroiliac inflammation (HCC) (08/18/2021), Shingles, Shortness of breath dyspnea, Sicca syndrome (HCC) (11/18/2018), Sleep walking and eating (03/06/2017), Snoring (03/06/2017), Spondylosis of lumbar region without myelopathy or radiculopathy (10/30/2011), TFC (triangular fibrocartilage complex) injury (02/25/2019), Thyroid nodule (08/11/2009), Trochanteric bursitis of left hip, Trochanteric bursitis, right hip (04/26/2020), Tubular adenoma of colon (01/28/2021), Tubular adenoma of colon (01/28/2021), Vasomotor symptoms due to menopause (04/19/2017), Vitamin D deficiency (05/08/2017), Vulvitis (07/22/2020), and Yeast vaginitis (07/11/2019). here with      ICD-10-CM   1. OSA (obstructive sleep apnea)  G47.33     2. Chronic migraine without aura without status migrainosus, not intractable  G43.709 ketorolac (TORADOL) injection 60 mg      Harriett Sine reports migraines continue fairly consistently. She would like to switch Emgality to [employer]. I will submit orders for 100mg  IV every 12 weeks. May increase dose to 300mg  q12w if needed. She will continue rizatriptan for abortive therapy. She has an active migraine, today. I will give her Toradol 60mg  IM in the office. Advised to avoid Nsaids for 24 hours. She was  encouraged to follow up closely with Dr Ethelene Hal. Neck etiology could very well contribute to headaches. Compliance report show optimal daily but sub optimal four hour compliance. I have encouraged her to focus on using CPAP daily for at least 4 hours. She will continue healthy lifestyle habits. She will follow-up with me in 6 months, sooner if needed.  She verbalizes understanding and agreement with this plan.   No orders of the defined types were placed in this encounter.     Meds ordered this encounter  Medications   rizatriptan (MAXALT) 10 MG tablet    Sig: Take 1 tablet (10 mg total) by mouth as needed for migraine. Take 1 tablet at onset of migraine. May repeat in 2 hours if needed. (Do not exceed more than 2 tab in 24 hours)    Dispense:  10 tablet    Refill:  11    Supervising Provider:   Anson Fret [[id number]]   ketorolac (TORADOL) injection 60 mg      Shawnie Dapper, FNP-C 09/27/2023, 3:26 PM [name] [address] [place], [place] [address] [phone]

## 2023-09-27 ENCOUNTER — Encounter: Payer: Self-pay | Admitting: Family Medicine

## 2023-09-27 ENCOUNTER — Telehealth: Payer: Self-pay | Admitting: Neurology

## 2023-09-27 ENCOUNTER — Ambulatory Visit: Payer: Medicare Other | Admitting: Family Medicine

## 2023-09-27 VITALS — BP 107/76 | HR 102 | Ht 66.0 in | Wt 165.0 lb

## 2023-09-27 DIAGNOSIS — G43709 Chronic migraine without aura, not intractable, without status migrainosus: Secondary | ICD-10-CM | POA: Diagnosis not present

## 2023-09-27 DIAGNOSIS — G4733 Obstructive sleep apnea (adult) (pediatric): Secondary | ICD-10-CM | POA: Diagnosis not present

## 2023-09-27 MED ORDER — KETOROLAC TROMETHAMINE 60 MG/2ML IM SOLN
60.0000 mg | Freq: Once | INTRAMUSCULAR | Status: AC
  Administered 2023-09-27: 60 mg via INTRAMUSCULAR

## 2023-09-27 MED ORDER — RIZATRIPTAN BENZOATE 10 MG PO TABS: 10.0000 mg | ORAL_TABLET | ORAL | 11 refills | Status: DC | PRN

## 2023-09-27 NOTE — Telephone Encounter (Signed)
Amy wanted to get the patient started with [facility] inf[facility]. I have got her to sign the order and submitted everything to infusion suite to attempt to work on PA.

## 2023-10-01 ENCOUNTER — Other Ambulatory Visit (INDEPENDENT_AMBULATORY_CARE_PROVIDER_SITE_OTHER): Payer: Self-pay | Admitting: Family Medicine

## 2023-10-01 DIAGNOSIS — E669 Obesity, unspecified: Secondary | ICD-10-CM

## 2023-10-18 ENCOUNTER — Ambulatory Visit (INDEPENDENT_AMBULATORY_CARE_PROVIDER_SITE_OTHER): Payer: Medicare Other | Admitting: Family Medicine

## 2023-10-18 ENCOUNTER — Encounter (INDEPENDENT_AMBULATORY_CARE_PROVIDER_SITE_OTHER): Payer: Self-pay | Admitting: Family Medicine

## 2023-10-18 VITALS — BP 121/89 | HR 90 | Temp 98.0°F | Ht 66.0 in | Wt 159.0 lb

## 2023-10-18 DIAGNOSIS — E559 Vitamin D deficiency, unspecified: Secondary | ICD-10-CM | POA: Diagnosis not present

## 2023-10-18 DIAGNOSIS — E65 Localized adiposity: Secondary | ICD-10-CM

## 2023-10-18 DIAGNOSIS — E669 Obesity, unspecified: Secondary | ICD-10-CM

## 2023-10-18 DIAGNOSIS — E039 Hypothyroidism, unspecified: Secondary | ICD-10-CM

## 2023-10-18 DIAGNOSIS — Z6825 Body mass index (BMI) 25.0-25.9, adult: Secondary | ICD-10-CM

## 2023-10-18 DIAGNOSIS — R7989 Other specified abnormal findings of blood chemistry: Secondary | ICD-10-CM | POA: Diagnosis not present

## 2023-10-18 MED ORDER — ZEPBOUND 2.5 MG/0.5ML ~~LOC~~ SOLN: 2.5000 mg | SUBCUTANEOUS | 0 refills | Status: DC

## 2023-10-18 NOTE — Progress Notes (Signed)
 .smr  Office: [phone]  /  Fax: [phone]  WEIGHT SUMMARY AND BIOMETRICS  Anthropometric Measurements Height: 5' 6 (1.676 m) Weight: 159 lb (72.1 kg) BMI (Calculated): 25.68 Weight at Last Visit: 164 lb Weight Lost Since Last Visit: 5 lb Weight Gained Since Last Visit: 0 Starting Weight: 199 lb Total Weight Loss (lbs): 40 lb (18.1 kg)   Body Composition  Body Fat %: 40.7 % Fat Mass (lbs): 65 lbs Muscle Mass (lbs): 90 lbs Total Body Water  (lbs): 67 lbs Visceral Fat Rating : 10   Other Clinical Data Fasting: No Labs: Yes Today's Visit #: 16 Starting Date: 12/11/22    Chief Complaint: OBESITY   History of Present Illness   Molly Giles is a 68 year old female who presents for management of obesity and vitamin D  deficiency.  She has been managing her obesity with Zepbound , resulting in a weight loss of 5 pounds in the last two months and a total of 40 pounds since starting the medication. Her BMI has decreased from 32 to 25.6. She faces challenges with nighttime snacking, which she manages by consuming 100-calorie packs of almonds and Yasso. She has not had issues with sweets, except for some consumption during Christmas, but she was able to maintain her weight loss.  Her vitamin D  level was recently elevated at 103, leading to the discontinuation of her prescription vitamin D , which she had been taking at a dose of 50,000 IU weekly. She plans to reassess her vitamin D  levels in three months.  She has concerns about excess skin and a 'wrinkly' appearance following her weight loss, particularly in the arms and thighs. She is considering cosmetic procedures to address these issues, including a 'mommy makeover' or tummy tuck, and inquires about liposuction. She also mentions concerns about drooping circles under her eyes and is interested in potential treatments for this.  She is currently managing her weight with Zepbound  and is considering adjusting the frequency  of its use. She is also taking minoxidil and Nutrafol for hair concerns, having completed two months of Nutrafol and starting the third month.          PHYSICAL EXAM:  Blood pressure 121/89, pulse 90, temperature 98 F (36.7 C), height 5' 6 (1.676 m), weight 159 lb (72.1 kg), SpO2 97%. Body mass index is 25.66 kg/m.  DIAGNOSTIC DATA REVIEWED:  BMET    Component Value Date/Time   NA 144 09/10/2023 0731   K 4.2 09/10/2023 0731   CL 107 (H) 09/10/2023 0731   CO2 22 09/10/2023 0731   GLUCOSE 79 09/10/2023 0731   GLUCOSE 93 07/05/2019 1629   BUN 20 09/10/2023 0731   CREATININE 0.71 09/10/2023 0731   CREATININE 0.74 02/24/2016 1158   CALCIUM  9.3 09/10/2023 0731   GFRNONAA 65 07/24/2019 1116   GFRNONAA 88 02/24/2016 1158   GFRAA 75 07/24/2019 1116   GFRAA >89 02/24/2016 1158   Lab Results  Component Value Date   HGBA1C 5.2 09/10/2023   HGBA1C 5.3 03/22/2017   Lab Results  Component Value Date   INSULIN  8.0 09/10/2023   INSULIN  10.5 03/22/2017   Lab Results  Component Value Date   TSH 5.490 (H) 09/10/2023   CBC    Component Value Date/Time   WBC 6.4 08/04/2020 0000   WBC 6.6 07/05/2019 1629   RBC 4.26 08/04/2020 0000   HGB 13.5 08/04/2020 0000   HGB 12.6 05/07/2018 1057   HCT 41 08/04/2020 0000   HCT 38.8 05/07/2018 1057  PLT 262 08/04/2020 0000   MCV 95.0 07/05/2019 1629   MCV 90 05/07/2018 1057   MCH 30.0 07/05/2019 1629   MCHC 31.6 07/05/2019 1629   RDW 13.8 07/05/2019 1629   RDW 14.0 05/07/2018 1057   Iron  Studies No results found for: IRON , TIBC, FERRITIN, IRONPCTSAT Lipid Panel     Component Value Date/Time   CHOL 166 09/10/2023 0731   TRIG 95 09/10/2023 0731   HDL 51 09/10/2023 0731   CHOLHDL 3.3 10/11/2021 1142   CHOLHDL 3.9 12/14/2014 1036   VLDL 26 12/14/2014 1036   LDLCALC 97 09/10/2023 0731   Hepatic Function Panel     Component Value Date/Time   PROT 6.6 09/10/2023 0731   ALBUMIN 4.3 09/10/2023 0731   AST 21 09/10/2023  0731   ALT 21 09/10/2023 0731   ALKPHOS 84 09/10/2023 0731   BILITOT 0.3 09/10/2023 0731   BILIDIR 0.14 02/12/2020 1056      Component Value Date/Time   TSH 5.490 (H) 09/10/2023 0731   Nutritional Lab Results  Component Value Date   VD25OH 103.0 (H) 09/10/2023   VD25OH 41.2 12/11/2022   VD25OH 43.3 05/07/2018     Assessment and Plan    Obesity Patient has lost 40 pounds on Zepbound , reducing BMI from 32 to 25.6. She manages nighttime snacking with 100-calorie packs of almonds and Yasso. Discussed continuing weight loss and muscle gain for overall health. Consider reducing Zepbound  to every two weeks to assess readiness to discontinue. Encouraged increased protein intake and focusing on large muscle groups for exercise. - Continue Zepbound  - Consider reducing Zepbound  to every two weeks - Encourage protein intake - Increase exercise focusing on large muscle groups - Recommend sink stretch exercise  Pannus Patient concerned about excess skin and fat post-weight loss. Discussed surgical options including panniculectomy and liposuction. Explained liposuction alone may result in wrinkly skin and combination with skin tightening may be better. Referred to Dr. Estefana Fritter for evaluation and potential treatment. She is also interested in facial rejuvenation options - Refer to Dr. Estefana Fritter for consultation - Provide Dr. Ace contact information  Vitamin D  Deficiency Patient's vitamin D  level elevated at 103, leading to discontinuation of prescription vitamin D . Recheck levels in three months to determine future supplementation needs. Explained weight loss can cause vitamin D  levels to rise faster, and future supplementation may be over-the-counter or prescription based on recheck results. - Discontinue prescription vitamin D  - Recheck vitamin D  levels in three months  Hypothyroidism TSH levels fluctuating, possibly due to stress. Previous T3 and T4 tests were  normal, suggesting no significant thyroid  dysfunction. Discussed further testing to confirm thyroid  function. - Order TSH, T3, T4 tests - Continue current thyroxine dosage  General Health Maintenance Reviewed recent lab results. Discussed hydration status, cholesterol levels, and B12 levels. Mild dehydration likely caused elevated BUN and electrolytes. HDL slightly decreased but within goal. Triglycerides and LDL levels are excellent. B12 levels adequate but could be improved with diet or supplements. - Encourage hydration - Continue current diet and exercise regimen - Monitor B12 levels, consider over-the-counter supplements if needed  Follow-up - Schedule follow-up appointment in March - Schedule additional follow-up appointment in April.         I have personally spent 40 minutes total time today in preparation, patient care, and documentation for this visit, including the following: review of clinical lab tests; review of medical tests/procedures/services.    She was informed of the importance of frequent follow up visits to maximize  her success with intensive lifestyle modifications for her multiple health conditions.    Louann Penton, MD

## 2023-10-19 LAB — T4, FREE: Free T4: 1.56 ng/dL (ref 0.82–1.77)

## 2023-10-19 LAB — TSH: TSH: 2.29 u[IU]/mL (ref 0.450–4.500)

## 2023-10-19 LAB — T3: T3, Total: 73 ng/dL (ref 71–180)

## 2023-10-22 ENCOUNTER — Encounter: Payer: Self-pay | Admitting: Family Medicine

## 2023-10-22 DIAGNOSIS — M542 Cervicalgia: Secondary | ICD-10-CM

## 2023-10-30 ENCOUNTER — Other Ambulatory Visit: Payer: Self-pay

## 2023-10-30 ENCOUNTER — Ambulatory Visit: Payer: Medicare Other | Attending: Physician Assistant | Admitting: Rehabilitative and Restorative Service Providers"

## 2023-10-30 ENCOUNTER — Encounter: Payer: Self-pay | Admitting: Rehabilitative and Restorative Service Providers"

## 2023-10-30 DIAGNOSIS — M6281 Muscle weakness (generalized): Secondary | ICD-10-CM | POA: Diagnosis present

## 2023-10-30 DIAGNOSIS — R293 Abnormal posture: Secondary | ICD-10-CM | POA: Insufficient documentation

## 2023-10-30 DIAGNOSIS — M542 Cervicalgia: Secondary | ICD-10-CM | POA: Insufficient documentation

## 2023-10-30 NOTE — Therapy (Signed)
OUTPATIENT PHYSICAL THERAPY CERVICAL EVALUATION   Patient Name: Molly Giles MRN: [id number] DOB:11-23-1955, 68 y.o., female Today's Date: 10/30/2023  END OF SESSION:  PT End of Session - 10/30/23 1359     Visit Number 1    Number of Visits 8    Date for PT Re-Evaluation 12/29/23    Authorization Type medicare and BCBS supplemental    Progress Note Due on Visit 10    PT Start Time 1403    PT Stop Time 1443    PT Time Calculation (min) 40 min    Activity Tolerance Patient tolerated treatment well    Behavior During Therapy [facility] for tasks assessed/performed            Past Medical History:  Diagnosis Date   Abdominal bloating 05/10/2021   Abdominal discomfort, generalized 05/07/2016   Acute gastritis 11/19/2020   Allergic rhinoconjunctivitis 07/02/2015   Anal fissure 11/19/2020   Anterior to posterior tear of superior glenoid labrum of left shoulder 02/22/2021   Arthritis    Benign positional vertigo 04/2015   Responded well to [facility]   Bruxism (teeth grinding)    Chronic contact dermatitis 08/06/2018   Per allergist Dr Edwyna Ready.    Chronic migraine 02/25/2016   Per Dr Lucia Gaskins review in notes from [facility] from September 2015. Showed total headache days last month 18. Severe headache days 7 days. Moderate headache days 5 days. Mild headache days last month sick days. Days without headache last month 10 days. Symptoms associated with photophobia, phonophobia, osmophobia, neck pain, dizziness, jaw pain, nasal congestion, vision disturbances, tingling and numbness, weakness and worsening with activity. Each headache attack last 3 hours depending on treatment in severity. Left side, the right side, easier side, the frontal area in the back of the head. Characterized as throbbing, pressure, tightness, squeezing, stabbing and burning    Chronic migraine without aura 05/15/2013    Dr Lucia Gaskins [facility]   Chronic tonsillitis  01/27/2019   DDD (degenerative disc disease), cervical 11/18/2018   DDD (degenerative disc disease), lumbar    Depression    Disc displacement, lumbar    Dysphagia 10/11/2021   Dysrhythmia    seen by dr Donnie Aho- not a problem since she has been on Bystolic   Episodic cluster headache, not intractable 03/06/2017   Essential hypertension 05/15/2013   Family history of adverse reaction to anesthesia    Brother- N/V   Family history of premature CAD 05/15/2013   Fibromyalgia syndrome 07/01/2015   Management by Dr Kathie Rhodes. Devashwar (Rheum)    GERD (gastroesophageal reflux disease) 12/16/2014   H/O seasonal allergies    Hallux rigidus of right foot 09/25/2022   Hammer toe    Left great toe   Hashimoto's thyroiditis    Per patient, diagnosed by Dr. Ferdinand Cava   Hearing loss of both ears 07/27/2015   mild to borderline moderate low frequency hearing loss improving to within normal limits bilaterally on audiology testing at [facility] in November 2016.     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   Hyperlipidemia 1998   Hypokalemia due to excessive gastrointestinal loss of potassium 07/28/2019   Hypothyroidism  Iliopsoas bursitis of left hip 05/08/2022   Impairment of balance 02/2015   Consequent of postconcussive syndrome   Injury of triangular fibrocartilage complex of left wrist 02/25/2019   Dx 02/25/19 Bradly Bienenstock IV MD ([facility])   Insulin resistance 07/04/2017   Internal hemorrhoid 01/28/2021   Internal hemorrhoid seen on colonoscopy 10/2020 Roderic Scarce MD [facility])   Interstitial cystitis    Irritable bowel syndrome with diarrhea 04/03/2016   Left ventricular  hypertrophy, mild 02/25/2016   ECHOcardiogram report 06/17/15 showing EF55-60%, mild LVH and G1DD    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HCC) 03/10/2018   [facility]   Lumbar facet joint pain    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   Mood disorder (HCC)    Morbid obesity (HCC) 03/06/2017   Musculoskeletal neck pain 07/14/2015   Nocturnal hypoxemia 03/06/2017   Normal coronary arteries 05/14/2014   Obesity (BMI 30.0-34.9) 01/29/2017   Odynophagia 12/20/2021   Osteoarthritis of left hip 11/01/2015   MRI order by Dr Charlann Boxer (ortho) 10/2015 showed significant arthritis of left hip joint with cystic changes in femoral head c/w osteoarthritis   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, lumbar region 10/30/2011   Other insomnia 11/09/2016   Overweight    Pain in joint of left shoulder 11/09/2016   Pain in joint, multiple sites 11/18/2018   Palpitations 05/15/2013   [facility] manages   Perianal candidiasis 12/20/2021   Periodic limb movement sleep disorder 03/28/2017   Perirectal cyst 05/07/2016   Poison ivy dermatitis 03/24/2021   PONV (postoperative nausea and vomiting)    Positive ANA (antinuclear antibody) 11/18/2018   Post concussion syndrome 06/06/2015   Post concussive syndrome 07/14/2015   Ms Dolinsky's post-concussive syndrome manifesting in vertigo and headache, mood changes, poor balance, dizziness, and decreased concentration per Dr Lucia Gaskins at [facility].    Posterior vitreous detachment of right eye 2014   Premature menopause 12/20/2021   Rectal abscess 05/10/2021   S/P left THA, AA 07/25/2016   Sacroiliac inflammation (HCC) 08/18/2021   Shingles    Shortness of breath dyspnea    with exertion   Sicca syndrome (HCC) 11/18/2018   (+) ANA   Sleep walking and eating 03/06/2017   Snoring 03/06/2017   Spondylosis of lumbar region without myelopathy or radiculopathy 10/30/2011   TFC (triangular fibrocartilage complex) injury 02/25/2019   Thyroid  nodule 08/11/2009   Findings: The thyroid gland is within normal limits in size.  The gland is diffusely inhomogeneous. A small solid nodule is noted in the lower pole  medially on the right of 7 x 6 x 8 mm. A small solid nodule is noted inferiorly on the left of 3 x 3 x 4 mm.  IMPRESSION:  The thyroid gland is within normal limits in size with only small solid nodules present, the largest of only 8 mm in diameter on the right.     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    Trochanteric bursitis, right hip 04/26/2020   Tubular adenoma of colon 01/28/2021   Colonoscopy screening 4 mm tubular Adenoma polyp Charlott Rakes, MD [facility])   Tubular adenoma of colon 01/28/2021   Colonoscopy screening 4 mm tubular Adenoma polyp Charlott Rakes, MD [facility])   Vasomotor symptoms due to menopause 04/19/2017   Vitamin D deficiency 05/08/2017   Vulvitis 07/22/2020   Yeast vaginitis 07/11/2019   Past Surgical History:  Procedure Laterality Date   arthroscopy Left  01/2022   with SAD and DCR, K. Supple MD   Bladder dilitation     x 3   BREAST BIOPSY Right 2011   Benign histology   CARDIOVASCULAR STRESS TEST  2000   Unremarkable per pt report   CARPOMETACARPAL JOINT ARTHROTOMY Right 2011   COLONOSCOPY     COLONOSCOPY WITH PROPOFOL N/A 04/21/2015   Procedure: COLONOSCOPY WITH PROPOFOL;  Surgeon: Jeani Hawking, MD;  Location[facility] END[facility]COPY;  Service: Endoscopy;  Laterality: N/A;   CYSTOSCOPY W/ DILATION OF BLADDER N/A    EPIDURAL BLOCK INJECTION Left 04/12/2016   Left Medial Nerve Block and Left L5 ramus block, Dr Sheran Luz    EPIDURAL BLOCK INJECTION  03/21/2016   Left L3-4 medial branch block and Left L5 & dorsal ramus block    EPIDURAL BLOCK INJECTION N/A 10/25/2016   Sheran Luz, MD. Lumbar medial branch block   EPIDURAL BLOCK INJECTION N/A 02/09/2017   Sheran Luz, MD.  Bilateral L3/4 medial branch block, bilateral L5  dorsal ramus block   EPIDURAL BLOCK INJECTION N/A 07/04/2017   Sheran Luz, MD   EXCISION MORTON'S NEUROMA Right 05/03/2023   Procedure: EXCISION MORTON'S NEUROMA;  Surgeon: Toni Arthurs, MD;  Location: [facility];  Service: Orthopedics;  Laterality: Right;  general, local by surgeon 60 MIN   FECAL TRANSPLANT  04/21/2015   Procedure: FECAL TRANSPLANT;  Surgeon: Jeani Hawking, MD;  Location: [facility] ENDOSCOPY;  Service: Endoscopy;;   HIP ARTHROPLASTY Left    HIP ARTHROSCOPY Left 03/06/2018   Left hip arthroplasty, redo for loose hip arthroplasty. Procedure at [facility]   INJECTION HIP INTRA ARTICULAR Left 11/2015   for OA by Dr Vilma Prader IMPLANT PLACEMENT  04/2021   Floyde Parkins MD ([facility])   INTERSTIM IMPLANT REVISION N/A 09/2021   Floyde Parkins MD ([facility])   LIGAMENT REPAIR Right 05/03/2023   Procedure: COLLATERAL LIGAMENT REPAIR;  Surgeon: Toni Arthurs, MD;  Location: [facility];  Service: Orthopedics;  Laterality: Right;  general, local by surgeon 60 MIN   OTHER SURGICAL HISTORY Left 2016   Left L3/L4 medial nerve block and Left L5 Dorsal Ramus block Dr Rosine Abe   TOTAL HIP ARTHROPLASTY Left 07/25/2016   Procedure: LEFT TOTAL HIP ARTHROPLASTY ANTERIOR APPROACH;  Surgeon: Durene Romans, MD;  Location: [facility];  Service: Orthopedics;  Laterality: Left;   WEIL OSTEOTOMY Right 05/03/2023   Procedure: RIGHT 2ND WEIL OSTEOTOMY;  Surgeon: Toni Arthurs, MD;  Location: [facility];  Service: Orthopedics;  Laterality: Right;  general, local by surgeon 60 MIN   Patient Active Problem List   Diagnosis Date Noted   Vertigo 03/19/2023   Shingles 03/19/2023   Polyphagia 03/07/2023   Rash 02/07/2023   BMI 27.0-27.9,adult 02/07/2023   Low serum vitamin B12 01/10/2023   OSA (obstructive sleep apnea) 12/27/2022   BMI 30.0-30.9,adult 12/27/2022   Severe obstructive sleep apnea-hypopnea syndrome 12/22/2022    Hyperglycemia 12/11/2022   Depression screening 12/11/2022   Generalized obesity 12/11/2022   SOBOE (shortness of breath on exertion) 12/11/2022   Other fatigue 12/11/2022   Sleep related headaches 11/07/2022   Intractable episodic cluster headache 11/07/2022   Sleep walking and eating 11/07/2022   Ambien use disorder, 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   Psychophysiological insomnia 11/07/2022   Rhinitis 11/03/2022   Hallux rigidus of right foot 09/25/2022   Morton neuroma, right 09/25/2022   Chronic pain of right knee 05/08/2022   Arthrosis of  hand 12/20/2021   History of Clostridioides difficile colitis, required fecal transplantation 12/20/2021   Mixed hyperlipidemia 12/20/2021   Meibomian gland dysfunction (MGD) of both eyes 10/17/2021   Nuclear sclerosis of both eyes 10/17/2021   Epiretinal membrane (ERM) of left eye 10/17/2021   OAB (overactive bladder) 03/02/2021   Osteoarthritis of acromioclavicular joint 02/22/2021   Hyperhidrosis, scalp, 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   Chronic low back pain 05/14/2019   Cervical spondylosis 11/18/2018   DDD (degenerative disc disease), lumbar 11/18/2018   Keratoconjunctivitis sicca 11/18/2018   Chronic contact dermatitis 08/06/2018   Primary osteoarthritis of left knee 06/20/2018   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HCC) 11/29/2017   Insulin resistance 07/04/2017   Vitamin D deficiency 05/08/2017   Periodic limb movement sleep disorder 03/28/2017   Obesity (BMI 30.0-34.9) 01/29/2017   Other insomnia 11/09/2016   Irritable bowel syndrome with diarrhea 04/03/2016   Left ventricular hypertrophy, mild 02/25/2016   Mood disorder (HCC) 07/02/2015   Allergic rhinoconjunctivitis 07/02/2015   Fibromyalgia syndrome 07/01/2015   History of colonic polyps 07/01/2015   GERD (gastroesophageal reflux disease) 12/16/2014   Hypothyroidism 05/15/2013   Pure hypercholesterolemia 05/15/2013   Chronic interstitial cystitis 05/15/2013   Chronic migraine without aura 05/15/2013     PCP: McDiarmid, Todd MD REFERRING PROVIDER: McDiarmid,Todd MD REFERRING DIAG: neck pain THERAPY DIAG:  Cervicalgia  Muscle weakness (generalized)  Abnormal posture  Rationale for Evaluation and Treatment: Rehabilitation  ONSET DATE: 10/22/2023  SUBJECTIVE:                                                                                                                                                                                                         SUBJECTIVE STATEMENT: The patient reports she began some exercises (she started a deep squat holding onto sink with back stretch and some of the prior HEP PT gave n November) and this led to significant L sided neck pain. She feels pain into her suboccipital region and notes some increased headache.  Hand dominance: Right  PERTINENT HISTORY:  Migraines, concussion, h/o vertigo, fibromyalgia  PAIN:  Are you having pain? Yes: NPRS scale: 5/10, worse with activity Pain location: L side of her neck Pain description: neck felt locked and unable to turn to the left side Aggravating factors: activity Relieving factors: not turning to the left, and heat, voltaren, muscle realxer  PRECAUTIONS: None  WEIGHT BEARING RESTRICTIONS: No  FALLS:  Has patient fallen in last 6 months? No  LIVING ENVIRONMENT: Lives with: lives with their spouse Lives in: House/apartment  OCCUPATION: Retired  PLOF: Independent  PATIENT  GOALS: Reduce pain in her neck.   OBJECTIVE:  Note: Objective measures were completed at Evaluation unless otherwise noted.  PATIENT SURVEYS:  NDI 40%   COGNITION: Overall cognitive status: Within functional limits for tasks assessed  SENSATION: WFL  POSTURE: rounded shoulders and forward head  PALPATION: Tenderness suboccipitals L, deep L upper trap, upper C-spine paraspinals   CERVICAL ROM:  Active ROM A/PROM (deg) eval  Flexion 30 deg  Extension 38 deg  Right lateral flexion 30 deg  Left lateral  flexion 30 deg  Right rotation 50 deg  Left rotation 38 deg *most painful   (Blank rows = not tested)  UPPER EXTREMITY MMT:  TBA* MMT Right eval Left eval  Shoulder flexion    Shoulder extension    Shoulder abduction    Shoulder adduction    Shoulder extension    Shoulder internal rotation    Shoulder external rotation    Middle trapezius    Lower trapezius    Elbow flexion    Elbow extension    Wrist flexion    Wrist extension    Wrist ulnar deviation    Wrist radial deviation    Wrist pronation    Wrist supination    Grip strength     (Blank rows = not tested)  CERVICAL SPECIAL TESTS:  Distraction improves pain  FUNCTIONAL TESTS:  Patient demonstrated deep squat exercise provided at [facility]  [facility] Adult PT Treatment:                                                DATE: 10/30/23 Therapeutic Exercise: See HEP below Deflated ball in supine--  chin tucks x 5 reps, cervical nods x 5 reps, rotation--with mild discomfort Manual Therapy: STM L upper trap, suboccipitals, and cervical musculature PROM deep cervical flexion with rotation Trigger Point Dry Needling Treatment: Pre-treatment instruction: Patient instructed on dry needling rationale, procedures, and possible side effects including pain during treatment (achy,cramping feeling), bruising, drop of blood, lightheadedness, nausea, sweating. Patient Consent Given: Yes Education handout provided: Yes Muscles treated: upper trap L, suboccipitals L  Treatment response/outcome: Twitch response elicited and Palpable decrease in muscle tension Post-treatment instructions: Patient instructed to expect possible mild to moderate muscle soreness later today and/or tomorrow. Patient instructed in methods to reduce muscle soreness and to continue prescribed HEP. If patient was dry needled over the lung field, patient was instructed on signs and symptoms of pneumothorax and, however unlikely, to see immediate  medical attention should they occur. Patient was also educated on signs and symptoms of infection and to seek medical attention should they occur. Patient verbalized understanding of these instructions and education.   PATIENT EDUCATION:  Education details: HEP, dry needling Person educated: Patient Education method: Explanation, Demonstration, and Handouts Education comprehension: verbalized understanding, returned demonstration, and needs further education  HOME EXERCISE PROGRAM: Access Code: [email or web address] URL: [email or web address] Date: 10/30/2023 Prepared by: Margretta Ditty  Exercises - Standing Backward Shoulder Rolls  - 1 x daily - 7 x weekly - 1 sets - 5-10 reps - Seated Cervical Rotation AROM  - 1 x daily - 7 x weekly - 1 sets - 5 reps  Patient Education - Trigger Point Dry Needling  ASSESSMENT:  CLINICAL IMPRESSION: Patient is a 68 y.o. female who was seen today for physical therapy evaluation and treatment for neck pain. She  has h/o chronic neck pain with general postural rounding and weakness. PT to address pain, dec'd ROM, and hypomobility. Plan to also discuss post d/c strengthening and exercises that can strengthen without flaring pain.    OBJECTIVE IMPAIRMENTS: decreased ROM, decreased strength, increased muscle spasms, impaired flexibility, impaired tone, and pain.   ACTIVITY LIMITATIONS: carrying, lifting, and reach over head  PARTICIPATION LIMITATIONS: driving  PERSONAL FACTORS: 1-2 comorbidities: h/o migraines and concussion  are also affecting patient's functional outcome.   REHAB POTENTIAL: Good  CLINICAL DECISION MAKING: Stable/uncomplicated  EVALUATION COMPLEXITY: Low   GOALS: Goals reviewed with patient? Yes  SHORT TERM GOALS: Target date: 11/27/23  The patient will be indep with HEP. Baseline:  initiated at eval Goal status: INITIAL  2.  The patient will improve NDI to < or equal to 30% Baseline:  40% Goal status: INITIAL  3.   The patient will report resting pain < or equal to 2/10. Baseline: 5/10 today Goal status: INITIAL  LONG TERM GOALS: Target date: 12/28/23  The patient will be indep with HEP progression Baseline: initiated at eval Goal status: INITIAL  2.  The patient will improve NDI to < or equal to 20%. Baseline:  40% Goal status: INITIAL  3.  The patient will improve AROM c-spine rotation to 55 degrees bilaterally. Baseline: see above Goal status: INITIAL  4.  The patient will verbalize post d/c HEP progression. Baseline:  does not have regular routine-- working on establishing Goal status: INITIAL  PLAN:  PT FREQUENCY: 1-2x/week  PT DURATION: 8 weeks  PLANNED INTERVENTIONS: [id number]- PT Re-evaluation, 97110-Therapeutic exercises, 97530- Therapeutic activity, [id number]- Neuromuscular re-education, 97535- Self Care, [id number]- Manual therapy, [id number]- Electrical stimulation (unattended), Patient/Family education, Taping, Dry Needling, Cryotherapy, and Moist heat  PLAN FOR NEXT SESSION: progress exercises, STM/manual/DN as indicated, postural strengthening.   Chasitie Passey, PT 10/30/2023, 2:11 PM

## 2023-10-30 NOTE — Patient Instructions (Signed)

## 2023-11-05 ENCOUNTER — Other Ambulatory Visit (INDEPENDENT_AMBULATORY_CARE_PROVIDER_SITE_OTHER): Payer: Self-pay | Admitting: Family Medicine

## 2023-11-05 DIAGNOSIS — E669 Obesity, unspecified: Secondary | ICD-10-CM

## 2023-11-06 ENCOUNTER — Encounter: Payer: Self-pay | Admitting: Rehabilitative and Restorative Service Providers"

## 2023-11-06 ENCOUNTER — Ambulatory Visit: Payer: Medicare Other | Admitting: Rehabilitative and Restorative Service Providers"

## 2023-11-06 DIAGNOSIS — R293 Abnormal posture: Secondary | ICD-10-CM

## 2023-11-06 DIAGNOSIS — M542 Cervicalgia: Secondary | ICD-10-CM

## 2023-11-06 DIAGNOSIS — M6281 Muscle weakness (generalized): Secondary | ICD-10-CM

## 2023-11-06 NOTE — Therapy (Signed)
 OUTPATIENT PHYSICAL THERAPY CERVICAL TREATMENT   Patient Name: Molly Giles MRN: [id number] DOB:1956-09-11, 68 y.o., female Today's Date: 11/06/2023  END OF SESSION:  PT End of Session - 11/06/23 1403     Visit Number 2    Number of Visits 8    Date for PT Re-Evaluation 12/29/23    Authorization Type medicare and BCBS supplemental    Progress Note Due on Visit 10    PT Start Time 1403    PT Stop Time 1445    PT Time Calculation (min) 42 min    Activity Tolerance Patient tolerated treatment well    Behavior During Therapy [facility] for tasks assessed/performed             Past Medical History:  Diagnosis Date   Abdominal bloating 05/10/2021   Abdominal discomfort, generalized 05/07/2016   Acute gastritis 11/19/2020   Allergic rhinoconjunctivitis 07/02/2015   Anal fissure 11/19/2020   Anterior to posterior tear of superior glenoid labrum of left shoulder 02/22/2021   Arthritis    Benign positional vertigo 04/2015   Responded well to [facility]   Bruxism (teeth grinding)    Chronic contact dermatitis 08/06/2018   Per allergist Dr Edwyna Ready.    Chronic migraine 02/25/2016   Per Dr Lucia Gaskins review in notes from [facility] from September 2015. Showed total headache days last month 18. Severe headache days 7 days. Moderate headache days 5 days. Mild headache days last month sick days. Days without headache last month 10 days. Symptoms associated with photophobia, phonophobia, osmophobia, neck pain, dizziness, jaw pain, nasal congestion, vision disturbances, tingling and numbness, weakness and worsening with activity. Each headache attack last 3 hours depending on treatment in severity. Left side, the right side, easier side, the frontal area in the back of the head. Characterized as throbbing, pressure, tightness, squeezing, stabbing and burning    Chronic migraine without aura 05/15/2013    [facility]   Chronic tonsillitis  01/27/2019   DDD (degenerative disc disease), cervical 11/18/2018   DDD (degenerative disc disease), lumbar    Depression    Disc displacement, lumbar    Dysphagia 10/11/2021   Dysrhythmia    seen by dr Donnie Aho- not a problem since she has been on Bystolic   Episodic cluster headache, not intractable 03/06/2017   Essential hypertension 05/15/2013   Family history of adverse reaction to anesthesia    Brother- N/V   Family history of premature CAD 05/15/2013   Fibromyalgia syndrome 07/01/2015   Management by Dr Kathie Rhodes. Devashwar (Rheum)    GERD (gastroesophageal reflux disease) 12/16/2014   H/O seasonal allergies    Hallux rigidus of right foot 09/25/2022   Hammer toe    Left great toe   Hashimoto's thyroiditis    Per patient, diagnosed by Dr. Ferdinand Cava   Hearing loss of both ears 07/27/2015   mild to borderline moderate low frequency hearing loss improving to within normal limits bilaterally on audiology testing at [facility] in November 2016.     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   Hyperlipidemia 1998   Hypokalemia due to excessive gastrointestinal loss of potassium 07/28/2019   Hypothyroidism  Iliopsoas bursitis of left hip 05/08/2022   Impairment of balance 02/2015   Consequent of postconcussive syndrome   Injury of triangular fibrocartilage complex of left wrist 02/25/2019   Dx 02/25/19 Bradly Bienenstock IV MD ([facility])   Insulin resistance 07/04/2017   Internal hemorrhoid 01/28/2021   Internal hemorrhoid seen on colonoscopy 10/2020 Roderic Scarce MD [facility])   Interstitial cystitis    Irritable bowel syndrome with diarrhea 04/03/2016   Left ventricular  hypertrophy, mild 02/25/2016   ECHOcardiogram report 06/17/15 showing EF55-60%, mild LVH and G1DD    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HCC) 03/10/2018   [facility]   Lumbar facet joint pain    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   Mood disorder (HCC)    Morbid obesity (HCC) 03/06/2017   Musculoskeletal neck pain 07/14/2015   Nocturnal hypoxemia 03/06/2017   Normal coronary arteries 05/14/2014   Obesity (BMI 30.0-34.9) 01/29/2017   Odynophagia 12/20/2021   Osteoarthritis of left hip 11/01/2015   MRI order by Dr Charlann Boxer (ortho) 10/2015 showed significant arthritis of left hip joint with cystic changes in femoral head c/w osteoarthritis   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, lumbar region 10/30/2011   Other insomnia 11/09/2016   Overweight    Pain in joint of left shoulder 11/09/2016   Pain in joint, multiple sites 11/18/2018   Palpitations 05/15/2013   [facility] manages   Perianal candidiasis 12/20/2021   Periodic limb movement sleep disorder 03/28/2017   Perirectal cyst 05/07/2016   Poison ivy dermatitis 03/24/2021   PONV (postoperative nausea and vomiting)    Positive ANA (antinuclear antibody) 11/18/2018   Post concussion syndrome 06/06/2015   Post concussive syndrome 07/14/2015   Ms Rotan's post-concussive syndrome manifesting in vertigo and headache, mood changes, poor balance, dizziness, and decreased concentration per Dr Lucia Gaskins at [facility].    Posterior vitreous detachment of right eye 2014   Premature menopause 12/20/2021   Rectal abscess 05/10/2021   S/P left THA, AA 07/25/2016   Sacroiliac inflammation (HCC) 08/18/2021   Shingles    Shortness of breath dyspnea    with exertion   Sicca syndrome (HCC) 11/18/2018   (+) ANA   Sleep walking and eating 03/06/2017   Snoring 03/06/2017   Spondylosis of lumbar region without myelopathy or radiculopathy 10/30/2011   TFC (triangular fibrocartilage complex) injury 02/25/2019   Thyroid  nodule 08/11/2009   Findings: The thyroid gland is within normal limits in size.  The gland is diffusely inhomogeneous. A small solid nodule is noted in the lower pole  medially on the right of 7 x 6 x 8 mm. A small solid nodule is noted inferiorly on the left of 3 x 3 x 4 mm.  IMPRESSION:  The thyroid gland is within normal limits in size with only small solid nodules present, the largest of only 8 mm in diameter on the right.     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    Trochanteric bursitis, right hip 04/26/2020   Tubular adenoma of colon 01/28/2021   Colonoscopy screening 4 mm tubular Adenoma polyp Charlott Rakes, MD [facility])   Tubular adenoma of colon 01/28/2021   Colonoscopy screening 4 mm tubular Adenoma polyp Charlott Rakes, MD [facility])   Vasomotor symptoms due to menopause 04/19/2017   Vitamin D deficiency 05/08/2017   Vulvitis 07/22/2020   Yeast vaginitis 07/11/2019   Past Surgical History:  Procedure Laterality Date   arthroscopy Left  01/2022   with SAD and DCR, K. Supple MD   Bladder dilitation     x 3   BREAST BIOPSY Right 2011   Benign histology   CARDIOVASCULAR STRESS TEST  2000   Unremarkable per pt report   CARPOMETACARPAL JOINT ARTHROTOMY Right 2011   COLONOSCOPY     COLONOSCOPY WITH PROPOFOL N/A 04/21/2015   Procedure: COLONOSCOPY WITH PROPOFOL;  Surgeon: Jeani Hawking, MD;  Location: [facility] ENDOSCOPY;  Service: Endoscopy;  Laterality: N/A;   CYSTOSCOPY W/ DILATION OF BLADDER N/A    EPIDURAL BLOCK INJECTION Left 04/12/2016   Left Medial Nerve Block and Left L5 ramus block, Dr Sheran Luz    EPIDURAL BLOCK INJECTION  03/21/2016   Left L3-4 medial branch block and Left L5 & dorsal ramus block    EPIDURAL BLOCK INJECTION N/A 10/25/2016   Sheran Luz, MD. Lumbar medial branch block   EPIDURAL BLOCK INJECTION N/A 02/09/2017   Sheran Luz, MD.  Bilateral L3/4 medial branch block, bilateral L5  dorsal ramus block   EPIDURAL BLOCK INJECTION N/A 07/04/2017   Sheran Luz, MD   EXCISION MORTON'S NEUROMA Right 05/03/2023   Procedure: EXCISION MORTON'S NEUROMA;  Surgeon: Toni Arthurs, MD;  Location: [facility];  Service: Orthopedics;  Laterality: Right;  general, local by surgeon 60 MIN   FECAL TRANSPLANT  04/21/2015   Procedure: FECAL TRANSPLANT;  Surgeon: Jeani Hawking, MD;  Location: [facility] ENDOSCOPY;  Service: Endoscopy;;   HIP ARTHROPLASTY Left    HIP ARTHROSCOPY Left 03/06/2018   Left hip arthroplasty, redo for loose hip arthroplasty. Procedure at [facility]   INJECTION HIP INTRA ARTICULAR Left 11/2015   for OA by Dr Vilma Prader IMPLANT PLACEMENT  04/2021   Floyde Parkins MD ([facility])   INTERSTIM IMPLANT REVISION N/A 09/2021   Floyde Parkins MD ([facility])   LIGAMENT REPAIR Right 05/03/2023   Procedure: COLLATERAL LIGAMENT REPAIR;  Surgeon: Toni Arthurs, MD;  Location: [facility];  Service: Orthopedics;  Laterality: Right;  general, local by surgeon 60 MIN   OTHER SURGICAL HISTORY Left 2016   Left L3/L4 medial nerve block and Left L5 Dorsal Ramus block Dr Rosine Abe   TOTAL HIP ARTHROPLASTY Left 07/25/2016   Procedure: LEFT TOTAL HIP ARTHROPLASTY ANTERIOR APPROACH;  Surgeon: Durene Romans, MD;  Location: [facility];  Service: Orthopedics;  Laterality: Left;   WEIL OSTEOTOMY Right 05/03/2023   Procedure: RIGHT 2ND WEIL OSTEOTOMY;  Surgeon: Toni Arthurs, MD;  Location: [facility];  Service: Orthopedics;  Laterality: Right;  general, local by surgeon 60 MIN   Patient Active Problem List   Diagnosis Date Noted   Vertigo 03/19/2023   Shingles 03/19/2023   Polyphagia 03/07/2023   Rash 02/07/2023   BMI 27.0-27.9,adult 02/07/2023   Low serum vitamin B12 01/10/2023   OSA (obstructive sleep apnea) 12/27/2022   BMI 30.0-30.9,adult 12/27/2022   Severe obstructive sleep apnea-hypopnea syndrome 12/22/2022    Hyperglycemia 12/11/2022   Depression screening 12/11/2022   Generalized obesity 12/11/2022   SOBOE (shortness of breath on exertion) 12/11/2022   Other fatigue 12/11/2022   Sleep related headaches 11/07/2022   Intractable episodic cluster headache 11/07/2022   Sleep walking and eating 11/07/2022   Ambien use disorder, 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   Psychophysiological insomnia 11/07/2022   Rhinitis 11/03/2022   Hallux rigidus of right foot 09/25/2022   Morton neuroma, right 09/25/2022   Chronic pain of right knee 05/08/2022   Arthrosis of  hand 12/20/2021   History of Clostridioides difficile colitis, required fecal transplantation 12/20/2021   Mixed hyperlipidemia 12/20/2021   Meibomian gland dysfunction (MGD) of both eyes 10/17/2021   Nuclear sclerosis of both eyes 10/17/2021   Epiretinal membrane (ERM) of left eye 10/17/2021   OAB (overactive bladder) 03/02/2021   Osteoarthritis of acromioclavicular joint 02/22/2021   Hyperhidrosis, scalp, 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   Chronic low back pain 05/14/2019   Cervical spondylosis 11/18/2018   DDD (degenerative disc disease), lumbar 11/18/2018   Keratoconjunctivitis sicca 11/18/2018   Chronic contact dermatitis 08/06/2018   Primary osteoarthritis of left knee 06/20/2018   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HCC) 11/29/2017   Insulin resistance 07/04/2017   Vitamin D deficiency 05/08/2017   Periodic limb movement sleep disorder 03/28/2017   Obesity (BMI 30.0-34.9) 01/29/2017   Other insomnia 11/09/2016   Irritable bowel syndrome with diarrhea 04/03/2016   Left ventricular hypertrophy, mild 02/25/2016   Mood disorder (HCC) 07/02/2015   Allergic rhinoconjunctivitis 07/02/2015   Fibromyalgia syndrome 07/01/2015   History of colonic polyps 07/01/2015   GERD (gastroesophageal reflux disease) 12/16/2014   Hypothyroidism 05/15/2013   Pure hypercholesterolemia 05/15/2013   Chronic interstitial cystitis 05/15/2013   Chronic migraine without aura 05/15/2013     PCP: McDiarmid, Tawanna Cooler MD REFERRING PROVIDER: McDiarmid,Todd MD REFERRING DIAG: neck pain THERAPY DIAG:  Cervicalgia  Muscle weakness (generalized)  Abnormal posture  Rationale for Evaluation and Treatment: Rehabilitation  ONSET DATE: 10/22/2023  SUBJECTIVE:                                                                                                                                                                                                         SUBJECTIVE STATEMENT: The patient is feeling better. Her ROM is improved.  EVALThe patient reports she began some exercises (she started a deep squat holding onto sink with back stretch and some of the prior HEP PT gave n November) and this led to significant L sided neck pain. She feels pain into her suboccipital region and notes some increased headache.  Hand dominance: Right  PERTINENT HISTORY:  Migraines, concussion, h/o vertigo, fibromyalgia  PAIN:  Are you having pain? Yes: NPRS scale: 5/10, worse with activity Pain location: L side of her neck Pain description: neck felt locked and unable to turn to the left side Aggravating factors: activity Relieving factors: not turning to the left, and heat, voltaren, muscle realxer  PRECAUTIONS: None  WEIGHT BEARING RESTRICTIONS: No  FALLS:  Has patient fallen in last 6 months? No  LIVING ENVIRONMENT: Lives with: lives with their spouse Lives  in: House/apartment  PATIENT GOALS: Reduce pain in her neck.   OBJECTIVE:  Note: Objective measures were completed at Evaluation unless otherwise noted.  PATIENT SURVEYS:  NDI 40%   COGNITION: Overall cognitive status: Within functional limits for tasks assessed  SENSATION: WFL  POSTURE: rounded shoulders and forward head  PALPATION: Tenderness suboccipitals L, deep L upper trap, upper C-spine paraspinals   CERVICAL ROM:  Active ROM A/PROM (deg) eval AROM   Flexion 30 deg   Extension 38 deg   Right lateral  flexion 30 deg 40  Left lateral flexion 30 deg 30  Right rotation 50 deg 65  Left rotation 38 deg *most painful 60   (Blank rows = not tested)  UPPER EXTREMITY MMT:  TBA* MMT Right eval Left eval  Shoulder flexion    Shoulder extension    Shoulder abduction    Shoulder adduction    Shoulder extension    Shoulder internal rotation    Shoulder external rotation    Middle trapezius    Lower trapezius    Elbow flexion    Elbow extension    Wrist flexion    Wrist extension    Wrist ulnar deviation    Wrist radial deviation    Wrist pronation    Wrist supination    Grip strength     (Blank rows = not tested)  CERVICAL SPECIAL TESTS:  Distraction improves pain  FUNCTIONAL TESTS:  Patient demonstrated deep squat exercise provided at [facility]   [facility] Adult PT Treatment:                                                DATE: 11/06/23 Therapeutic Exercise: Supine Thoracic extension x 2 minutes with towel  Prone Elbow prop with rotation R and L for thoracic rotation x 10 reps Sidelying Open book x 12 reps Seated AROM rotation C-spine AROM sidebending x 3 reps with holds Neck rotation with flexion/extension SCM stretch anterior musculature Standing Shoulder extension red band x 12 reps Row with red band x 12 reps   [facility] Adult PT Treatment:                                                DATE: 10/30/23 Therapeutic Exercise: See HEP below Deflated ball in supine--  chin tucks x 5 reps, cervical nods x 5 reps, rotation--with mild discomfort Manual Therapy: STM L upper trap, suboccipitals, and cervical musculature PROM deep cervical flexion with rotation Trigger Point Dry Needling Treatment: Pre-treatment instruction: Patient instructed on dry needling rationale, procedures, and possible side effects including pain during treatment (achy,cramping feeling), bruising, drop of blood, lightheadedness, nausea, sweating. Patient Consent Given: Yes Education  handout provided: Yes Muscles treated: upper trap L, suboccipitals L  Treatment response/outcome: Twitch response elicited and Palpable decrease in muscle tension Post-treatment instructions: Patient instructed to expect possible mild to moderate muscle soreness later today and/or tomorrow. Patient instructed in methods to reduce muscle soreness and to continue prescribed HEP. If patient was dry needled over the lung field, patient was instructed on signs and symptoms of pneumothorax and, however unlikely, to see immediate medical attention should they occur. Patient was also educated on signs and symptoms of infection and to  seek medical attention should they occur. Patient verbalized understanding of these instructions and education.   PATIENT EDUCATION:  Education details: HEP, dry needling Person educated: Patient Education method: Explanation, Demonstration, and Handouts Education comprehension: verbalized understanding, returned demonstration, and needs further education  HOME EXERCISE PROGRAM: Access Code: [email or web address] URL: [email or web address] Date: 11/06/2023 Prepared by: Margretta Ditty  Exercises - Standing Backward Shoulder Rolls  - 1 x daily - 5 x weekly - 1 sets - 5-10 reps - Seated Cervical Rotation AROM  - 1 x daily - 5 x weekly - 1 sets - 5 reps - Sternocleidomastoid Stretch  - 2 x daily - 7 x weekly - 1 sets - 10 reps - Seated Cervical Sidebending AROM  - 2 x daily - 7 x weekly - 1 sets - 10 reps - Sidelying Open Book Thoracic Lumbar Rotation and Extension  - 1 x daily - 5 x weekly - 1 sets - 10 reps - Prone on Elbows Thoracic Rotation  - 1 x daily - 5 x weekly - 1 sets - 10 reps - Plank on Knees  - 1 x daily - 5 x weekly - 1 sets - 10 reps - Squat with Chair Touch  - 2 x daily - 7 x weekly - 1 sets - 10 reps -  ASSESSMENT:  CLINICAL IMPRESSION: The patient tolerates increased HEP and strengthening. Added some activities to home with instruction to go until  gentle stretch versus pain. Also modified deep squat at the sink as she was using Ues to help pull herself up-- switched it to a deep squat using chair for support. Plan to continue working to STGs/ LTGs to patient tolerance. She did get a couple of brief episodes of dizziness with rolling today that resolved within a few seconds.   EVAL:Patient is a 68 y.o. female who was seen today for physical therapy evaluation and treatment for neck pain. She has h/o chronic neck pain with general postural rounding and weakness. PT to address pain, dec'd ROM, and hypomobility. Plan to also discuss post d/c strengthening and exercises that can strengthen without flaring pain.    OBJECTIVE IMPAIRMENTS: decreased ROM, decreased strength, increased muscle spasms, impaired flexibility, impaired tone, and pain.   GOALS: Goals reviewed with patient? Yes  SHORT TERM GOALS: Target date: 11/27/23  The patient will be indep with HEP. Baseline:  initiated at eval Goal status: INITIAL  2.  The patient will improve NDI to < or equal to 30% Baseline:  40% Goal status: INITIAL  3.  The patient will report resting pain < or equal to 2/10. Baseline: 5/10 today Goal status: INITIAL  LONG TERM GOALS: Target date: 12/28/23  The patient will be indep with HEP progression Baseline: initiated at eval Goal status: INITIAL  2.  The patient will improve NDI to < or equal to 20%. Baseline:  40% Goal status: INITIAL  3.  The patient will improve AROM c-spine rotation to 55 degrees bilaterally. Baseline: see above Goal status: INITIAL  4.  The patient will verbalize post d/c HEP progression. Baseline:  does not have regular routine-- working on establishing Goal status: INITIAL  PLAN:  PT FREQUENCY: 1-2x/week  PT DURATION: 8 weeks  PLANNED INTERVENTIONS: 97164- PT Re-evaluation, 97110-Therapeutic exercises, 97530- Therapeutic activity, [id number]- Neuromuscular re-education, 97535- Self Care, [id number]- Manual therapy,  [id number]- Electrical stimulation (unattended), Patient/Family education, Taping, Dry Needling, Cryotherapy, and Moist heat  PLAN FOR NEXT SESSION: progress exercises, STM/manual/DN as indicated, postural strengthening.    Keilani Terrance, PT  11/06/2023, 3:05 PM

## 2023-11-12 ENCOUNTER — Encounter: Payer: Self-pay | Admitting: Family Medicine

## 2023-11-13 ENCOUNTER — Ambulatory Visit: Payer: Medicare Other | Attending: Family Medicine | Admitting: Rehabilitative and Restorative Service Providers"

## 2023-11-13 ENCOUNTER — Encounter: Payer: Self-pay | Admitting: Rehabilitative and Restorative Service Providers"

## 2023-11-13 DIAGNOSIS — R293 Abnormal posture: Secondary | ICD-10-CM | POA: Insufficient documentation

## 2023-11-13 DIAGNOSIS — M6281 Muscle weakness (generalized): Secondary | ICD-10-CM | POA: Insufficient documentation

## 2023-11-13 DIAGNOSIS — M542 Cervicalgia: Secondary | ICD-10-CM | POA: Insufficient documentation

## 2023-11-13 NOTE — Therapy (Signed)
 OUTPATIENT PHYSICAL THERAPY CERVICAL TREATMENT   Patient Name: Molly Giles MRN: [id number] DOB:1955-10-20, 69 y.o., female Today's Date: 11/13/2023  END OF SESSION:  PT End of Session - 11/13/23 1106     Visit Number 3    Number of Visits 8    Date for PT Re-Evaluation 12/29/23    Authorization Type medicare and BCBS supplemental    Progress Note Due on Visit 10    PT Start Time 1106    PT Stop Time 1145    PT Time Calculation (min) 39 min    Activity Tolerance Patient tolerated treatment well    Behavior During Therapy WFL for tasks assessed/performed              Past Medical History:  Diagnosis Date   Abdominal bloating 05/10/2021   Abdominal discomfort, generalized 05/07/2016   Acute gastritis 11/19/2020   Allergic rhinoconjunctivitis 07/02/2015   Anal fissure 11/19/2020   Anterior to posterior tear of superior glenoid labrum of left shoulder 02/22/2021   Arthritis    Benign positional vertigo 04/2015   Responded well to [facility]   Bruxism (teeth grinding)    Chronic contact dermatitis 08/06/2018   Per allergist Dr Edwyna Ready.    Chronic migraine 02/25/2016   Per Dr Lucia Gaskins review in notes from [facility] from September 2015. Showed total headache days last month 18. Severe headache days 7 days. Moderate headache days 5 days. Mild headache days last month sick days. Days without headache last month 10 days. Symptoms associated with photophobia, phonophobia, osmophobia, neck pain, dizziness, jaw pain, nasal congestion, vision disturbances, tingling and numbness, weakness and worsening with activity. Each headache attack last 3 hours depending on treatment in severity. Left side, the right side, easier side, the frontal area in the back of the head. Characterized as throbbing, pressure, tightness, squeezing, stabbing and burning    Chronic migraine without aura 05/15/2013    Dr Lucia Gaskins [facility]   Chronic tonsillitis  01/27/2019   DDD (degenerative disc disease), cervical 11/18/2018   DDD (degenerative disc disease), lumbar    Depression    Disc displacement, lumbar    Dysphagia 10/11/2021   Dysrhythmia    seen by dr Donnie Aho- not a problem since she has been on Bystolic   Episodic cluster headache, not intractable 03/06/2017   Essential hypertension 05/15/2013   Family history of adverse reaction to anesthesia    Brother- N/V   Family history of premature CAD 05/15/2013   Fibromyalgia syndrome 07/01/2015   Management by Dr Kathie Rhodes. Devashwar (Rheum)    GERD (gastroesophageal reflux disease) 12/16/2014   H/O seasonal allergies    Hallux rigidus of right foot 09/25/2022   Hammer toe    Left great toe   Hashimoto's thyroiditis    Per patient, diagnosed by Dr. Ferdinand Cava   Hearing loss of both ears 07/27/2015   mild to borderline moderate low frequency hearing loss improving to within normal limits bilaterally on audiology testing at [facility] in November 2016.     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   Hyperlipidemia 1998   Hypokalemia due to excessive gastrointestinal loss of potassium 07/28/2019   Hypothyroidism  Iliopsoas bursitis of left hip 05/08/2022   Impairment of balance 02/2015   Consequent of postconcussive syndrome   Injury of triangular fibrocartilage complex of left wrist 02/25/2019   Dx 02/25/19 Bradly Bienenstock IV MD ([facility])   Insulin resistance 07/04/2017   Internal hemorrhoid 01/28/2021   Internal hemorrhoid seen on colonoscopy 10/2020 Roderic Scarce MD [facility])   Interstitial cystitis    Irritable bowel syndrome with diarrhea 04/03/2016   Left ventricular  hypertrophy, mild 02/25/2016   ECHOcardiogram report 06/17/15 showing EF55-60%, mild LVH and G1DD    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HCC) 03/10/2018   [facility]   Lumbar facet joint pain    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   Mood disorder (HCC)    Morbid obesity (HCC) 03/06/2017   Musculoskeletal neck pain 07/14/2015   Nocturnal hypoxemia 03/06/2017   Normal coronary arteries 05/14/2014   Obesity (BMI 30.0-34.9) 01/29/2017   Odynophagia 12/20/2021   Osteoarthritis of left hip 11/01/2015   MRI order by Dr Charlann Boxer (ortho) 10/2015 showed significant arthritis of left hip joint with cystic changes in femoral head c/w osteoarthritis   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, lumbar region 10/30/2011   Other insomnia 11/09/2016   Overweight    Pain in joint of left shoulder 11/09/2016   Pain in joint, multiple sites 11/18/2018   Palpitations 05/15/2013   [facility] manages   Perianal candidiasis 12/20/2021   Periodic limb movement sleep disorder 03/28/2017   Perirectal cyst 05/07/2016   Poison ivy dermatitis 03/24/2021   PONV (postoperative nausea and vomiting)    Positive ANA (antinuclear antibody) 11/18/2018   Post concussion syndrome 06/06/2015   Post concussive syndrome 07/14/2015   Ms Tupou's post-concussive syndrome manifesting in vertigo and headache, mood changes, poor balance, dizziness, and decreased concentration per Dr Lucia Gaskins at [facility].    Posterior vitreous detachment of right eye 2014   Premature menopause 12/20/2021   Rectal abscess 05/10/2021   S/P left THA, AA 07/25/2016   Sacroiliac inflammation (HCC) 08/18/2021   Shingles    Shortness of breath dyspnea    with exertion   Sicca syndrome (HCC) 11/18/2018   (+) ANA   Sleep walking and eating 03/06/2017   Snoring 03/06/2017   Spondylosis of lumbar region without myelopathy or radiculopathy 10/30/2011   TFC (triangular fibrocartilage complex) injury 02/25/2019   Thyroid  nodule 08/11/2009   Findings: The thyroid gland is within normal limits in size.  The gland is diffusely inhomogeneous. A small solid nodule is noted in the lower pole  medially on the right of 7 x 6 x 8 mm. A small solid nodule is noted inferiorly on the left of 3 x 3 x 4 mm.  IMPRESSION:  The thyroid gland is within normal limits in size with only small solid nodules present, the largest of only 8 mm in diameter on the right.     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    Trochanteric bursitis, right hip 04/26/2020   Tubular adenoma of colon 01/28/2021   Colonoscopy screening 4 mm tubular Adenoma polyp Charlott Rakes, MD [facility])   Tubular adenoma of colon 01/28/2021   Colonoscopy screening 4 mm tubular Adenoma polyp Charlott Rakes, MD [facility])   Vasomotor symptoms due to menopause 04/19/2017   Vitamin D deficiency 05/08/2017   Vulvitis 07/22/2020   Yeast vaginitis 07/11/2019   Past Surgical History:  Procedure Laterality Date   arthroscopy Left  01/2022   with SAD and DCR, K. Supple MD   Bladder dilitation     x 3   BREAST BIOPSY Right 2011   Benign histology   CARDIOVASCULAR STRESS TEST  2000   Unremarkable per pt report   CARPOMETACARPAL JOINT ARTHROTOMY Right 2011   COLONOSCOPY     COLONOSCOPY WITH PROPOFOL N/A 04/21/2015   Procedure: COLONOSCOPY WITH PROPOFOL;  Surgeon: Jeani Hawking, MD;  Location: [facility] ENDOSCOPY;  Service: Endoscopy;  Laterality: N/A;   CYSTOSCOPY W/ DILATION OF BLADDER N/A    EPIDURAL BLOCK INJECTION Left 04/12/2016   Left Medial Nerve Block and Left L5 ramus block, Dr Sheran Luz    EPIDURAL BLOCK INJECTION  03/21/2016   Left L3-4 medial branch block and Left L5 & dorsal ramus block    EPIDURAL BLOCK INJECTION N/A 10/25/2016   Sheran Luz, MD. Lumbar medial branch block   EPIDURAL BLOCK INJECTION N/A 02/09/2017   Sheran Luz, MD.  Bilateral L3/4 medial branch block, bilateral L5  dorsal ramus block   EPIDURAL BLOCK INJECTION N/A 07/04/2017   Sheran Luz, MD   EXCISION MORTON'S NEUROMA Right 05/03/2023   Procedure: EXCISION MORTON'S NEUROMA;  Surgeon: Toni Arthurs, MD;  Location: [facility];  Service: Orthopedics;  Laterality: Right;  general, local by surgeon 60 MIN   FECAL TRANSPLANT  04/21/2015   Procedure: FECAL TRANSPLANT;  Surgeon: Jeani Hawking, MD;  Location: [facility] ENDOSCOPY;  Service: Endoscopy;;   HIP ARTHROPLASTY Left    HIP ARTHROSCOPY Left 03/06/2018   Left hip arthroplasty, redo for loose hip arthroplasty. Procedure at [facility] hospital   INJECTION HIP INTRA ARTICULAR Left 11/2015   for OA by Dr Vilma Prader IMPLANT PLACEMENT  04/2021   Floyde Parkins MD ([facility])   INTERSTIM IMPLANT REVISION N/A 09/2021   Floyde Parkins MD ([facility])   LIGAMENT REPAIR Right 05/03/2023   Procedure: COLLATERAL LIGAMENT REPAIR;  Surgeon: Toni Arthurs, MD;  Location: [facility];  Service: Orthopedics;  Laterality: Right;  general, local by surgeon 60 MIN   OTHER SURGICAL HISTORY Left 2016   Left L3/L4 medial nerve block and Left L5 Dorsal Ramus block Dr Rosine Abe   TOTAL HIP ARTHROPLASTY Left 07/25/2016   Procedure: LEFT TOTAL HIP ARTHROPLASTY ANTERIOR APPROACH;  Surgeon: Durene Romans, MD;  Location: [facility];  Service: Orthopedics;  Laterality: Left;   WEIL OSTEOTOMY Right 05/03/2023   Procedure: RIGHT 2ND WEIL OSTEOTOMY;  Surgeon: Toni Arthurs, MD;  Location: [facility];  Service: Orthopedics;  Laterality: Right;  general, local by surgeon 60 MIN   Patient Active Problem List   Diagnosis Date Noted   Vertigo 03/19/2023   Shingles 03/19/2023   Polyphagia 03/07/2023   Rash 02/07/2023   BMI 27.0-27.9,adult 02/07/2023   Low serum vitamin B12 01/10/2023   OSA (obstructive sleep apnea) 12/27/2022   BMI 30.0-30.9,adult 12/27/2022   Severe obstructive sleep apnea-hypopnea syndrome 12/22/2022    Hyperglycemia 12/11/2022   Depression screening 12/11/2022   Generalized obesity 12/11/2022   SOBOE (shortness of breath on exertion) 12/11/2022   Other fatigue 12/11/2022   Sleep related headaches 11/07/2022   Intractable episodic cluster headache 11/07/2022   Sleep walking and eating 11/07/2022   Ambien use disorder, 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   Psychophysiological insomnia 11/07/2022   Rhinitis 11/03/2022   Hallux rigidus of right foot 09/25/2022   Morton neuroma, right 09/25/2022   Chronic pain of right knee 05/08/2022   Arthrosis of  hand 12/20/2021   History of Clostridioides difficile colitis, required fecal transplantation 12/20/2021   Mixed hyperlipidemia 12/20/2021   Meibomian gland dysfunction (MGD) of both eyes 10/17/2021   Nuclear sclerosis of both eyes 10/17/2021   Epiretinal membrane (ERM) of left eye 10/17/2021   OAB (overactive bladder) 03/02/2021   Osteoarthritis of acromioclavicular joint 02/22/2021   Hyperhidrosis, scalp, 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   Chronic low back pain 05/14/2019   Cervical spondylosis 11/18/2018   DDD (degenerative disc disease), lumbar 11/18/2018   Keratoconjunctivitis sicca 11/18/2018   Chronic contact dermatitis 08/06/2018   Primary osteoarthritis of left knee 06/20/2018   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HCC) 11/29/2017   Insulin resistance 07/04/2017   Vitamin D deficiency 05/08/2017   Periodic limb movement sleep disorder 03/28/2017   Obesity (BMI 30.0-34.9) 01/29/2017   Other insomnia 11/09/2016   Irritable bowel syndrome with diarrhea 04/03/2016   Left ventricular hypertrophy, mild 02/25/2016   Mood disorder (HCC) 07/02/2015   Allergic rhinoconjunctivitis 07/02/2015   Fibromyalgia syndrome 07/01/2015   History of colonic polyps 07/01/2015   GERD (gastroesophageal reflux disease) 12/16/2014   Hypothyroidism 05/15/2013   Pure hypercholesterolemia 05/15/2013   Chronic interstitial cystitis 05/15/2013   Chronic migraine without aura 05/15/2013     PCP: McDiarmid, Tawanna Cooler MD REFERRING PROVIDER: McDiarmid,Todd MD REFERRING DIAG: neck pain THERAPY DIAG:  Cervicalgia  Muscle weakness (generalized)  Abnormal posture  Rationale for Evaluation and Treatment: Rehabilitation  ONSET DATE: 10/22/2023  SUBJECTIVE:                                                                                                                                                                                                         SUBJECTIVE STATEMENT: The patient reports she has had some bad HA days. She is continuing to do HEP and has L sided neck pain that does wake her occasionally.   EVALThe patient reports she began some exercises (she started a deep squat holding onto sink with back stretch and some of the prior HEP PT gave n November) and this led to significant L sided neck pain. She feels pain into her suboccipital region and notes some increased headache.  Hand dominance: Right  PERTINENT HISTORY:  Migraines, concussion, h/o vertigo, fibromyalgia  PAIN:  Are you having pain? Yes: NPRS scale: 5/10, worse with activity Pain location: L side of her neck Pain description: neck felt locked and unable to turn to the left side Aggravating factors: activity Relieving factors: not turning to the left, and heat, voltaren, muscle realxer  PRECAUTIONS: None  WEIGHT BEARING RESTRICTIONS: No  FALLS:  Has patient fallen in last 6 months? No  LIVING ENVIRONMENT: Lives with: lives with their spouse Lives in: House/apartment  PATIENT GOALS: Reduce pain in her neck.   OBJECTIVE:  Note: Objective measures were completed at Evaluation unless otherwise noted.  PATIENT SURVEYS:  NDI 40%   COGNITION: Overall cognitive status: Within functional limits for tasks assessed  SENSATION: WFL  POSTURE: rounded shoulders and forward head  PALPATION: Tenderness suboccipitals L, deep L upper trap, upper C-spine paraspinals   CERVICAL ROM:  Active ROM  A/PROM (deg) eval AROM   Flexion 30 deg   Extension 38 deg   Right lateral flexion 30 deg 40  Left lateral flexion 30 deg 30  Right rotation 50 deg 65  Left rotation 38 deg *most painful 60   (Blank rows = not tested)  UPPER EXTREMITY MMT:  TBA* MMT Right eval Left eval  Shoulder flexion    Shoulder extension    Shoulder abduction    Shoulder adduction    Shoulder extension    Shoulder internal rotation    Shoulder external rotation    Middle trapezius    Lower trapezius    Elbow flexion    Elbow extension    Wrist flexion    Wrist extension    Wrist ulnar deviation    Wrist radial deviation    Wrist pronation    Wrist supination    Grip strength     (Blank rows = not tested)  CERVICAL SPECIAL TESTS:  Distraction improves pain  FUNCTIONAL TESTS:  Patient demonstrated deep squat exercise provided at [facility]   [facility] Adult PT Treatment:                                                DATE: 11/13/23 Therapeutic Exercise: Standing Shoulder rolls Wall leans with thoracic opening adding thread the needle x 10 reps Elevated surface plank on elbows with 15 second holds x 5 reps Shoulder row with red band x 10 reps Shoulder extension with red band x 10 reps Manual Therapy: STM for L upper trap, bilateral suboccipitals, bilateral mutifidi, and scalenes Trigger Point Dry Needling Subsequent Treatment: Instructions reviewed, if requested by the patient, prior to subsequent dry needling treatment.  Patient Verbal Consent Given: Yes Education Handout Provided: Yes Muscles Treated: L upper trap, L C3-C4 cervical multifidi Treatment Response/Outcome:  palpable lengthening, twitch response   [facility] Adult PT Treatment:                                                DATE: 11/06/23 Therapeutic Exercise: Supine Thoracic extension x 2 minutes with towel  Prone Elbow prop with rotation R and L for thoracic rotation x 10 reps Sidelying Open book x 12  reps Seated AROM rotation C-spine AROM sidebending x 3 reps with holds Neck rotation with flexion/extension SCM stretch anterior musculature Standing Shoulder extension red band x 12 reps Row with red band x 12 reps   [facility] Adult PT Treatment:  DATE: 10/30/23 Therapeutic Exercise: See HEP below Deflated ball in supine--  chin tucks x 5 reps, cervical nods x 5 reps, rotation--with mild discomfort Manual Therapy: STM L upper trap, suboccipitals, and cervical musculature PROM deep cervical flexion with rotation Trigger Point Dry Needling Treatment: Pre-treatment instruction: Patient instructed on dry needling rationale, procedures, and possible side effects including pain during treatment (achy,cramping feeling), bruising, drop of blood, lightheadedness, nausea, sweating. Patient Consent Given: Yes Education handout provided: Yes Muscles treated: upper trap L, suboccipitals L  Treatment response/outcome: Twitch response elicited and Palpable decrease in muscle tension Post-treatment instructions: Patient instructed to expect possible mild to moderate muscle soreness later today and/or tomorrow. Patient instructed in methods to reduce muscle soreness and to continue prescribed HEP. If patient was dry needled over the lung field, patient was instructed on signs and symptoms of pneumothorax and, however unlikely, to see immediate medical attention should they occur. Patient was also educated on signs and symptoms of infection and to seek medical attention should they occur. Patient verbalized understanding of these instructions and education.   PATIENT EDUCATION:  Education details: HEP, dry needling Person educated: Patient Education method: Explanation, Demonstration, and Handouts Education comprehension: verbalized understanding, returned demonstration, and needs further education  HOME EXERCISE PROGRAM: Access Code: [email or web address] URL:  [email or web address] Date: 11/13/2023 Prepared by: Margretta Ditty  Exercises - Standing Backward Shoulder Rolls  - 1 x daily - 7 x weekly - 1 sets - 5-10 reps - Sternocleidomastoid Stretch  - 2 x daily - 7 x weekly - 1 sets - 10 reps - Seated Cervical Sidebending AROM  - 2 x daily - 7 x weekly - 1 sets - 10 reps - Seated Cervical Rotation AROM  - 1 x daily - 5 x weekly - 1 sets - 5 reps - Sidelying Open Book Thoracic Lumbar Rotation and Extension  - 1 x daily - 5 x weekly - 1 sets - 10 reps - Standing with Forearms Thoracic Rotation  - 1 x daily - 4 x weekly - 1 sets - 10 reps - Plank with Elbows on Table  - 1 x daily - 4 x weekly - 2 sets - 5 reps - 15 seconds hold - Standing Shoulder Row with Anchored Resistance  - 1 x daily - 3 x weekly - 2 sets - 10 reps - Single Arm Shoulder Extension with Anchored Resistance  - 1 x daily - 3 x weekly - 2 sets - 10 reps - Squat with Chair Touch  - 2 x daily - 3 x weekly - 1 sets - 10 reps -  ASSESSMENT:  CLINICAL IMPRESSION: The patient's ROM in c-spine improved after dry needling today. The patient is progressing strengthening. Her HA have been worse and she attributes this to smells that she is sensitive to (more stores burning candles, etc). PT to progress to STGs.    EVAL:Patient is a 68 y.o. female who was seen today for physical therapy evaluation and treatment for neck pain. She has h/o chronic neck pain with general postural rounding and weakness. PT to address pain, dec'd ROM, and hypomobility. Plan to also discuss post d/c strengthening and exercises that can strengthen without flaring pain.    OBJECTIVE IMPAIRMENTS: decreased ROM, decreased strength, increased muscle spasms, impaired flexibility, impaired tone, and pain.   GOALS: Goals reviewed with patient? Yes  SHORT TERM GOALS: Target date: 11/27/23  The patient will be indep with HEP. Baseline:  initiated at eval Goal status: INITIAL  2.  The  patient will improve  NDI to < or equal to 30% Baseline:  40% Goal status: INITIAL  3.  The patient will report resting pain < or equal to 2/10. Baseline: 5/10 today Goal status: INITIAL  LONG TERM GOALS: Target date: 12/28/23  The patient will be indep with HEP progression Baseline: initiated at eval Goal status: INITIAL  2.  The patient will improve NDI to < or equal to 20%. Baseline:  40% Goal status: INITIAL  3.  The patient will improve AROM c-spine rotation to 55 degrees bilaterally. Baseline: see above Goal status: INITIAL  4.  The patient will verbalize post d/c HEP progression. Baseline:  does not have regular routine-- working on establishing Goal status: INITIAL  PLAN:  PT FREQUENCY: 1-2x/week  PT DURATION: 8 weeks  PLANNED INTERVENTIONS: 97164- PT Re-evaluation, 97110-Therapeutic exercises, 97530- Therapeutic activity, [id number]- Neuromuscular re-education, 97535- Self Care, 13086- Manual therapy, [id number]- Electrical stimulation (unattended), Patient/Family education, Taping, Dry Needling, Cryotherapy, and Moist heat  PLAN FOR NEXT SESSION: progress exercises, STM/manual/DN as indicated, postural strengthening.  Recheck NDI and begin checking STGs.   Charmaine Placido, PT 11/13/2023, 11:49 AM

## 2023-11-14 NOTE — Telephone Encounter (Signed)
 Molly Giles scheduling patient on 11/21/23 at 7:45 am

## 2023-11-14 NOTE — Telephone Encounter (Signed)
 Ok per Amy to schedule 745a to work pt in. Scheduled for 11/21/23. NP made aware.

## 2023-11-15 ENCOUNTER — Encounter (INDEPENDENT_AMBULATORY_CARE_PROVIDER_SITE_OTHER): Payer: Self-pay | Admitting: Family Medicine

## 2023-11-15 ENCOUNTER — Ambulatory Visit (INDEPENDENT_AMBULATORY_CARE_PROVIDER_SITE_OTHER): Payer: BLUE CROSS/BLUE SHIELD | Admitting: Family Medicine

## 2023-11-15 VITALS — BP 90/65 | HR 85 | Temp 97.5°F | Ht 66.0 in | Wt 158.0 lb

## 2023-11-15 DIAGNOSIS — Z6825 Body mass index (BMI) 25.0-25.9, adult: Secondary | ICD-10-CM

## 2023-11-15 DIAGNOSIS — Z8639 Personal history of other endocrine, nutritional and metabolic disease: Secondary | ICD-10-CM

## 2023-11-15 DIAGNOSIS — I959 Hypotension, unspecified: Secondary | ICD-10-CM | POA: Diagnosis not present

## 2023-11-15 DIAGNOSIS — E559 Vitamin D deficiency, unspecified: Secondary | ICD-10-CM

## 2023-11-15 DIAGNOSIS — R7989 Other specified abnormal findings of blood chemistry: Secondary | ICD-10-CM | POA: Diagnosis not present

## 2023-11-15 DIAGNOSIS — E66811 Obesity, class 1: Secondary | ICD-10-CM

## 2023-11-15 DIAGNOSIS — E669 Obesity, unspecified: Secondary | ICD-10-CM

## 2023-11-15 NOTE — Progress Notes (Signed)
 Office: [phone]  /  Fax: [phone]  WEIGHT SUMMARY AND BIOMETRICS  Anthropometric Measurements Height: 5\' 6"  (1.676 m) Weight: 158 lb (71.7 kg) BMI (Calculated): 25.51 Weight at Last Visit: 159lb Weight Lost Since Last Visit: 1lb Weight Gained Since Last Visit: 0 Starting Weight: 199lb Total Weight Loss (lbs): 41 lb (18.6 kg)   Body Composition  Body Fat %: 39.9 % Fat Mass (lbs): 63 lbs Muscle Mass (lbs): 90.2 lbs Total Body Water (lbs): 67.2 lbs Visceral Fat Rating : 10   Other Clinical Data Fasting: no Labs: no Today's Visit #: 17 Starting Date: 12/11/22    Chief Complaint: OBESITY    History of Present Illness   Molly Giles is a 68 year old female who presents for obesity treatment and progress monitoring.  She has lost one pound in the last month, bringing her BMI to 25.5. She follows her category one eating plan about 75% of the time and has incorporated strength training into her routine for about 15 minutes, six days per week. There is a slight improvement in muscle mass over the last month as measured by a bioimpedance scale. She is concerned about only losing one pound but acknowledges that it was a pound of fat and her muscle mass has increased slightly.  She is currently taking low dose minoxidil 2.5 mg for hair loss and a vitamin D prescription of 50,000 IU per week. Her most recent vitamin D level was elevated at 103. She mentions feeling 'good' and not experiencing lightheadedness or dizziness beyond her usual vertigo symptoms. She discusses her hydration habits, noting that she drinks water and coffee.  She experiences some aches and pains, which she attributes to either the weather or her exercise routine. She has a history of vertigo and has been seeing a physical therapist for this issue. She describes her strength training exercises and notes soreness after performing them.          PHYSICAL EXAM:  Blood pressure 90/65, pulse 85,  temperature (!) 97.5 F (36.4 C), height 5\' 6"  (1.676 m), weight 158 lb (71.7 kg), SpO2 95%. Body mass index is 25.5 kg/m.  DIAGNOSTIC DATA REVIEWED:  BMET    Component Value Date/Time   NA 144 09/10/2023 0731   K 4.2 09/10/2023 0731   CL 107 (H) 09/10/2023 0731   CO2 22 09/10/2023 0731   GLUCOSE 79 09/10/2023 0731   GLUCOSE 93 07/05/2019 1629   BUN 20 09/10/2023 0731   CREATININE 0.71 09/10/2023 0731   CREATININE 0.74 02/24/2016 1158   CALCIUM 9.3 09/10/2023 0731   GFRNONAA 65 07/24/2019 1116   GFRNONAA 88 02/24/2016 1158   GFRAA 75 07/24/2019 1116   GFRAA >89 02/24/2016 1158   Lab Results  Component Value Date   HGBA1C 5.2 09/10/2023   HGBA1C 5.3 03/22/2017   Lab Results  Component Value Date   INSULIN 8.0 09/10/2023   INSULIN 10.5 03/22/2017   Lab Results  Component Value Date   TSH 2.290 10/18/2023   CBC    Component Value Date/Time   WBC 6.4 08/04/2020 0000   WBC 6.6 07/05/2019 1629   RBC 4.26 08/04/2020 0000   HGB 13.5 08/04/2020 0000   HGB 12.6 05/07/2018 1057   HCT 41 08/04/2020 0000   HCT 38.8 05/07/2018 1057   PLT 262 08/04/2020 0000   MCV 95.0 07/05/2019 1629   MCV 90 05/07/2018 1057   MCH 30.0 07/05/2019 1629   MCHC 31.6 07/05/2019 1629   RDW 13.8  07/05/2019 1629   RDW 14.0 05/07/2018 1057   Iron Studies No results found for: "IRON", "TIBC", "FERRITIN", "IRONPCTSAT" Lipid Panel     Component Value Date/Time   CHOL 166 09/10/2023 0731   TRIG 95 09/10/2023 0731   HDL 51 09/10/2023 0731   CHOLHDL 3.3 10/11/2021 1142   CHOLHDL 3.9 12/14/2014 1036   VLDL 26 12/14/2014 1036   LDLCALC 97 09/10/2023 0731   Hepatic Function Panel     Component Value Date/Time   PROT 6.6 09/10/2023 0731   ALBUMIN 4.3 09/10/2023 0731   AST 21 09/10/2023 0731   ALT 21 09/10/2023 0731   ALKPHOS 84 09/10/2023 0731   BILITOT 0.3 09/10/2023 0731   BILIDIR 0.14 02/12/2020 1056      Component Value Date/Time   TSH 2.290 10/18/2023 1212   Nutritional Lab  Results  Component Value Date   VD25OH 103.0 (H) 09/10/2023   VD25OH 41.2 12/11/2022   VD25OH 43.3 05/07/2018     Assessment and Plan    Obesity in remission She has successfully managed her weight, losing one pound in the last month. Her BMI is 25.5. She adheres to her category one eating plan 75% of the time and has incorporated strength training, resulting in slight improvement in muscle mass per bioimpedance scale. She is considering tapering off Zepbound due to cost concerns and has initiated a self-tapering process. The medication remains in the system for four weeks post last dose, allowing a self-tapering effect. Alternatives include immediate discontinuation or tapering every two weeks for four doses. - Continue Zepbound 2.5 mg until current supply is exhausted, then discontinue. - Consider alternative structured eating plans for more variety, including a pescetarian plan. - Encourage continuation of strength training and healthy eating habits.  Asymptomatic hypotension Blood pressure is 90/65 mmHg. She is not on antihypertensives except for low-dose minoxidil for hair loss, which may slightly lower blood pressure. Dehydration is suspected as a contributing factor. - Increase water intake, especially when consuming coffee. - Monitor for symptoms of hypotension such as lightheadedness or dizziness.  Vitamin D deficiency (over-replacement) Vitamin D level is elevated at 103 ng/mL. She has been advised to hold vitamin D supplementation to allow levels to decrease below 100 ng/mL. - Hold vitamin D supplementation. - Recheck vitamin D levels in 6 to 8 weeks.  Thyroid function monitoring Previous TSH was slightly elevated but has normalized to 2.2. T3 and free T4 levels are normal. Previous elevation attributed to stress with no ongoing thyroid issues. - No immediate action required as thyroid levels are normal.  Follow-up She is managing her weight and health conditions well. She  has a follow-up appointment scheduled for April 3rd but is considering extending the interval between visits. - Reschedule follow-up appointment to 6 to 8 weeks from now. - Perform fasting labs at the next appointment.        She was informed of the importance of frequent follow up visits to maximize her success with intensive lifestyle modifications for her multiple health conditions.    Quillian Quince, MD

## 2023-11-16 ENCOUNTER — Encounter: Payer: Self-pay | Admitting: Plastic Surgery

## 2023-11-16 ENCOUNTER — Ambulatory Visit (INDEPENDENT_AMBULATORY_CARE_PROVIDER_SITE_OTHER): Payer: Self-pay | Admitting: Plastic Surgery

## 2023-11-16 VITALS — BP 130/92 | HR 85 | Ht 66.0 in | Wt 162.4 lb

## 2023-11-16 DIAGNOSIS — Z719 Counseling, unspecified: Secondary | ICD-10-CM

## 2023-11-16 NOTE — Progress Notes (Signed)
 Patient ID: Molly Giles, female    DOB: 09/30/1955, 68 y.o.   MRN: [id number]   Chief Complaint  Patient presents with   Advice Only    The patient is a 68 year old female here for evaluation of her face.  She is 5 feet 6 inches tall weighs 162 pounds.  She has been actively [facility]  Since she has been losing weight she has noticed some changes in the way her face looks and is interested in rejuvenation.  The thing that bothers her the most are the under eye area and the neck.  She has not really done anything in the past but was in the sun frequently growing up.  She has interstitial cystitis and hyperlipidemia.  She is on Zepbound and her last hemoglobin A1c in December was 5.2.  She has some cramping of the skin and loss of the midface volume.  She does have excess adipose as well as loose skin on the neck area.    Review of Systems  Constitutional: Negative.   Eyes: Negative.   Endocrine: Negative.     Past Medical History:  Diagnosis Date   Abdominal bloating 05/10/2021   Abdominal discomfort, generalized 05/07/2016   Acute gastritis 11/19/2020   Allergic rhinoconjunctivitis 07/02/2015   Anal fissure 11/19/2020   Anterior to posterior tear of superior glenoid labrum of left shoulder 02/22/2021   Arthritis    Benign positional vertigo 04/2015   Responded well to [facility]   Bruxism (teeth grinding)    Chronic contact dermatitis 08/06/2018   Per allergist Dr Edwyna Ready.    Chronic migraine 02/25/2016   Per Dr Lucia Gaskins review in notes from [facility] from September 2015. Showed total headache days last month 18. Severe headache days 7 days. Moderate headache days 5 days. Mild headache days last month sick days. Days without headache last month 10 days. Symptoms associated with photophobia, phonophobia, osmophobia, neck pain, dizziness, jaw pain, nasal congestion, vision  disturbances, tingling and numbness, weakness and worsening with activity. Each headache attack last 3 hours depending on treatment in severity. Left side, the right side, easier side, the frontal area in the back of the head. Characterized as throbbing, pressure, tightness, squeezing, stabbing and burning    Chronic migraine without aura 05/15/2013    Dr Lucia Gaskins [facility]   Chronic tonsillitis 01/27/2019   DDD (degenerative disc disease), cervical 11/18/2018   DDD (degenerative disc disease), lumbar    Depression    Disc displacement, lumbar    Dysphagia 10/11/2021   Dysrhythmia    seen by dr Donnie Aho- not a problem since she has been on Bystolic   Episodic cluster headache, not intractable 03/06/2017   Essential hypertension 05/15/2013   Family history of adverse reaction to anesthesia    Brother- N/V   Family history of premature CAD 05/15/2013   Fibromyalgia syndrome 07/01/2015   Management by Dr Kathie Rhodes. Devashwar ([facility])    GERD (gastroesophageal reflux disease) 12/16/2014   H/O seasonal allergies    Hallux rigidus of right foot 09/25/2022   Hammer toe    Left great toe   Hashimoto's thyroiditis    Per patient, diagnosed by Dr. Ferdinand Cava   Hearing loss of both ears 07/27/2015   mild to borderline moderate low frequency hearing loss improving to within normal limits bilaterally on audiology testing at [facility] in November 2016.     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   Hyperlipidemia 1998   Hypokalemia due to excessive gastrointestinal loss of potassium 07/28/2019   Hypothyroidism    Iliopsoas bursitis of left hip 05/08/2022   Impairment of balance  02/2015   Consequent of postconcussive syndrome   Injury of triangular fibrocartilage complex of left wrist 02/25/2019   Dx 02/25/19 Bradly Bienenstock IV MD ([facility])   Insulin resistance 07/04/2017   Internal hemorrhoid 01/28/2021   Internal hemorrhoid seen on colonoscopy 10/2020 Roderic Scarce MD [facility])   Interstitial cystitis    Irritable bowel syndrome with diarrhea 04/03/2016   Left ventricular hypertrophy, mild 02/25/2016   ECHOcardiogram report 06/17/15 showing EF55-60%, mild LVH and G1DD    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HCC) 03/10/2018   [facility]   Lumbar facet joint pain    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   Mood disorder (HCC)    Morbid obesity (HCC) 03/06/2017   Musculoskeletal neck pain 07/14/2015   Nocturnal hypoxemia 03/06/2017   Normal coronary arteries 05/14/2014   Obesity (BMI 30.0-34.9) 01/29/2017   Odynophagia 12/20/2021   Osteoarthritis of left hip 11/01/2015   MRI order by Dr Charlann Boxer (ortho) 10/2015 showed significant arthritis of left hip joint with cystic changes in femoral head c/w osteoarthritis   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, lumbar region 10/30/2011   Other insomnia 11/09/2016   Overweight    Pain in joint of left shoulder 11/09/2016   Pain in joint, multiple sites 11/18/2018   Palpitations 05/15/2013   [facility] manages   Perianal candidiasis 12/20/2021   Periodic limb movement sleep disorder 03/28/2017   Perirectal cyst 05/07/2016   Poison ivy dermatitis 03/24/2021   PONV (postoperative nausea and vomiting)    Positive ANA (antinuclear antibody) 11/18/2018   Post concussion syndrome 06/06/2015   Post concussive syndrome 07/14/2015   Ms Mcnay's post-concussive syndrome manifesting in vertigo and headache, mood changes, poor balance, dizziness, and decreased concentration per Dr Lucia Gaskins at [facility].    Posterior vitreous detachment of right eye 2014   Premature menopause 12/20/2021   Rectal  abscess 05/10/2021   S/P left THA, AA 07/25/2016   Sacroiliac inflammation (HCC) 08/18/2021   Shingles    Shortness of breath dyspnea    with exertion   Sicca syndrome (HCC) 11/18/2018   (+) ANA   Sleep walking and eating 03/06/2017   Snoring 03/06/2017   Spondylosis of lumbar region without myelopathy or radiculopathy 10/30/2011   TFC (triangular fibrocartilage complex) injury 02/25/2019   Thyroid nodule 08/11/2009   Findings: The thyroid gland is within normal limits in size.  The gland is diffusely inhomogeneous. A small solid nodule is noted in the lower pole  medially on the right of 7 x 6 x 8 mm. A small solid nodule is noted inferiorly on the left of 3 x 3 x 4 mm.  IMPRESSION:  The thyroid gland is within normal limits in size with only small solid nodules present, the largest of only 8 mm in diameter on the right.     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    Trochanteric bursitis, right hip 04/26/2020  Tubular adenoma of colon 01/28/2021   Colonoscopy screening 4 mm tubular Adenoma polyp Charlott Rakes, MD [facility])   Tubular adenoma of colon 01/28/2021   Colonoscopy screening 4 mm tubular Adenoma polyp Charlott Rakes, MD [facility])   Vasomotor symptoms due to menopause 04/19/2017   Vitamin D deficiency 05/08/2017   Vulvitis 07/22/2020   Yeast vaginitis 07/11/2019    Past Surgical History:  Procedure Laterality Date   arthroscopy Left 01/2022   with SAD and DCR, K. Supple MD   Bladder dilitation     x 3   BREAST BIOPSY Right 2011   Benign histology   CARDIOVASCULAR STRESS TEST  2000   Unremarkable per pt report   CARPOMETACARPAL JOINT ARTHROTOMY Right 2011   COLONOSCOPY     COLONOSCOPY WITH PROPOFOL N/A 04/21/2015   Procedure: COLONOSCOPY WITH PROPOFOL;  Surgeon: Jeani Hawking, MD;  [facility] END[facility];  Service: Endoscopy;  Laterality: N/A;   CYSTOSCOPY W/ DILATION OF BLADDER N/A    EPIDURAL BLOCK  INJECTION Left 04/12/2016   Left Medial Nerve Block and Left L5 ramus block, Dr Sheran Luz    EPIDURAL BLOCK INJECTION  03/21/2016   Left L3-4 medial branch block and Left L5 & dorsal ramus block    EPIDURAL BLOCK INJECTION N/A 10/25/2016   Sheran Luz, MD. Lumbar medial branch block   EPIDURAL BLOCK INJECTION N/A 02/09/2017   Sheran Luz, MD.  Bilateral L3/4 medial branch block, bilateral L5 dorsal ramus block   EPIDURAL BLOCK INJECTION N/A 07/04/2017   Sheran Luz, MD   EXCISION MORTON'S NEUROMA Right 05/03/2023   Procedure: EXCISION MORTON'S NEUROMA;  Surgeon: Toni Arthurs, MD;  Location: [facility];  Service: Orthopedics;  Laterality: Right;  general, local by surgeon 60 MIN   FECAL TRANSPLANT  04/21/2015   Procedure: FECAL TRANSPLANT;  Surgeon: Jeani Hawking, MD;  Location: [facility] END[facility]COPY;  Service: Endoscopy;;   HIP ARTHROPLASTY Left    HIP ARTHROSCOPY Left 03/06/2018   Left hip arthroplasty, redo for loose hip arthroplasty. Procedure at [facility] hospital   INJECTION HIP INTRA ARTICULAR Left 11/2015   for OA by Dr Vilma Prader IMPLANT PLACEMENT  04/2021   Floyde Parkins MD ([facility])   INTERSTIM IMPLANT REVISION N/A 09/2021   Floyde Parkins MD ([facility])   LIGAMENT REPAIR Right 05/03/2023   Procedure: COLLATERAL LIGAMENT REPAIR;  Surgeon: Toni Arthurs, MD;  Location: [facility];  Service: Orthopedics;  Laterality: Right;  general, local by surgeon 60 MIN   OTHER SURGICAL HISTORY Left 2016   Left L3/L4 medial nerve block and Left L5 Dorsal Ramus block Dr Rosine Abe   TOTAL HIP ARTHROPLASTY Left 07/25/2016   Procedure: LEFT TOTAL HIP ARTHROPLASTY ANTERIOR APPROACH;  Surgeon: Durene Romans, MD;  Location: [facility];  Service: Orthopedics;  Laterality: Left;   WEIL OSTEOTOMY Right 05/03/2023   Procedure: RIGHT 2ND WEIL OSTEOTOMY;  Surgeon: Toni Arthurs, MD;  Location: [facility];  Service: Orthopedics;   Laterality: Right;  general, local by surgeon 60 MIN      Current Outpatient Medications:    Ascorbic Acid (VITAMIN C PO), Take by mouth., Disp: , Rfl:    citalopram (CELEXA) 20 MG tablet, TAKE 1 TABLET(20 MG) BY MOUTH DAILY, Disp: 90 tablet, Rfl: 3   levothyroxine (SYNTHROID) 125 MCG tablet, Take 0.5 tablets (62.5 mcg total) by mouth daily before breakfast., Disp: 45 tablet, Rfl: 3   meloxicam (MOBIC) 15 MG tablet, Take  by mouth as needed., Disp: , Rfl:    methenamine (HIPREX) 1 g tablet, Take 1 g by mouth 2 (two) times daily with a meal., Disp: , Rfl:    methocarbamol (ROBAXIN) 500 MG tablet, Take 500 mg by mouth as needed. , Disp: , Rfl:    minoxidil (LONITEN) 2.5 MG tablet, Take by mouth daily., Disp: , Rfl:    nystatin-triamcinolone (MYCOLOG II) cream, APPLY TOPICALLY TO THE AFFECTED AREA TWICE DAILY, Disp: 30 g, Rfl: 0   rizatriptan (MAXALT) 10 MG tablet, Take 1 tablet (10 mg total) by mouth as needed for migraine. Take 1 tablet at onset of migraine. May repeat in 2 hours if needed. (Do not exceed more than 2 tab in 24 hours), Disp: 10 tablet, Rfl: 11   rosuvastatin (CRESTOR) 10 MG tablet, TAKE 1 TABLET(10 MG) BY MOUTH DAILY, Disp: 90 tablet, Rfl: 3   SYSTANE BALANCE 0.6 % SOLN, , Disp: , Rfl:    tirzepatide (ZEPBOUND) 2.5 MG/0.5ML injection vial, Inject 2.5 mg into the skin once a week., Disp: 2 mL, Rfl: 0   traMADol (ULTRAM) 50 MG tablet, Take 1 tablet by mouth 2 (two) times daily as needed., Disp: , Rfl:    zolpidem (AMBIEN) 5 MG tablet, Take 1 tablet (5 mg total) by mouth at bedtime as needed for sleep., Disp: 30 tablet, Rfl: 5   Objective:   Vitals:   11/16/23 0927  BP: (!) 130/92  Pulse: 85  SpO2: 99%    Physical Exam Vitals reviewed.  Constitutional:      Appearance: Normal appearance.  HENT:     Head: Atraumatic.  Cardiovascular:     Rate and Rhythm: Normal rate.  Neurological:     Mental Status: She is oriented to person, place, and time.  Psychiatric:         Mood and Affect: Mood normal.        Behavior: Behavior normal.        Thought Content: Thought content normal.        Judgment: Judgment normal.     Assessment & Plan:  Encounter for counseling  The patient has some really nice options for facial rejuvenation.  I am a send her the information for the BBL and the halo.  She is a good candidate for [employer] as well as midface filler.  Ultimately she may decide on a facelift but if she goes through the steps I believe she will have much healthier skin.  The patient is good to look over the information.  Will also get a get her an appointment with [facility] for consultation.  Alena Bills Muriah Harsha, DO

## 2023-11-19 ENCOUNTER — Ambulatory Visit (INDEPENDENT_AMBULATORY_CARE_PROVIDER_SITE_OTHER): Payer: Self-pay

## 2023-11-19 DIAGNOSIS — Z719 Counseling, unspecified: Secondary | ICD-10-CM

## 2023-11-19 NOTE — Patient Instructions (Signed)
 Below is our plan:  We will increase Vyepti dose to 300mg  via infusion every 12 weeks.   Please make sure you are staying well hydrated. I recommend 50-60 ounces daily. Well balanced diet and regular exercise encouraged. Consistent sleep schedule with 6-8 hours recommended.   Please continue follow up with care team as directed.   Follow up with me in 6 months  You may receive a survey regarding today's visit. I encourage you to leave honest feed back as I do use this information to improve patient care. Thank you for seeing me today!   GENERAL HEADACHE INFORMATION:   Natural supplements: Magnesium Oxide or Magnesium Glycinate 500 mg at bed (up to 800 mg daily) Coenzyme Q10 300 mg in AM Vitamin B2- 200 mg twice a day   Add 1 supplement at a time since even natural supplements can have undesirable side effects. You can sometimes buy supplements cheaper (especially Coenzyme Q10) at www.WebmailGuide.co.za or at [facility]  Migraine with aura: There is increased risk for stroke in women with migraine with aura and a contraindication for the combined contraceptive pill for use by women who have migraine with aura. The risk for women with migraine without aura is lower. However other risk factors like smoking are far more likely to increase stroke risk than migraine. There is a recommendation for no smoking and for the use of OCPs without estrogen such as progestogen only pills particularly for women with migraine with aura.Marland Kitchen People who have migraine headaches with auras may be 3 times more likely to have a stroke caused by a blood clot, compared to migraine patients who don't see auras. Women who take hormone-replacement therapy may be 30 percent more likely to suffer a clot-based stroke than women not taking medication containing estrogen. Other risk factors like smoking and high blood pressure may be  much more important.    Vitamins and herbs that show potential:   Magnesium: Magnesium (250 mg twice a  day or 500 mg at bed) has a relaxant effect on smooth muscles such as blood vessels. Individuals suffering from frequent or daily headache usually have low magnesium levels which can be increase with daily supplementation of 400-750 mg. Three trials found 40-90% average headache reduction  when used as a preventative. Magnesium may help with headaches are aura, the best evidence for magnesium is for migraine with aura is its thought to stop the cortical spreading depression we believe is the pathophysiology of migraine aura.Magnesium also demonstrated the benefit in menstrually related migraine.  Magnesium is part of the messenger system in the serotonin cascade and it is a good muscle relaxant.  It is also useful for constipation which can be a side effect of other medications used to treat migraine. Good sources include nuts, whole grains, and tomatoes. Side Effects: loose stool/diarrhea  Riboflavin (vitamin B 2) 200 mg twice a day. This vitamin assists nerve cells in the production of ATP a principal energy storing molecule.  It is necessary for many chemical reactions in the body.  There have been at least 3 clinical trials of riboflavin using 400 mg per day all of which suggested that migraine frequency can be decreased.  All 3 trials showed significant improvement in over half of migraine sufferers.  The supplement is found in bread, cereal, milk, meat, and poultry.  Most Americans get more riboflavin than the recommended daily allowance, however riboflavin deficiency is not necessary for the supplements to help prevent headache. Side effects: energizing, green urine  Coenzyme Q10: This is present in almost all cells in the body and is critical component for the conversion of energy.  Recent studies have shown that a nutritional supplement of CoQ10 can reduce the frequency of migraine attacks by improving the energy production of cells as with riboflavin.  Doses of 150 mg twice a day have been shown to be  effective.   Melatonin: Increasing evidence shows correlation between melatonin secretion and headache conditions.  Melatonin supplementation has decreased headache intensity and duration.  It is widely used as a sleep aid.  Sleep is natures way of dealing with migraine.  A dose of 3 mg is recommended to start for headaches including cluster headache. Higher doses up to 15 mg has been reviewed for use in Cluster headache and have been used. The rationale behind using melatonin for cluster is that many theories regarding the cause of Cluster headache center around the disruption of the normal circadian rhythm in the brain.  This helps restore the normal circadian rhythm.   HEADACHE DIET: Foods and beverages which may trigger migraine Note that only 20% of headache patients are food sensitive. You will know if you are food sensitive if you get a headache consistently 20 minutes to 2 hours after eating a certain food. Only cut out a food if it causes headaches, otherwise you might remove foods you enjoy! What matters most for diet is to eat a well balanced healthy diet full of vegetables and low fat protein, and to not miss meals.   Chocolate, other sweets ALL cheeses except cottage and cream cheese Dairy products, yogurt, sour cream, ice cream Liver Meat extracts (Bovril, Marmite, meat tenderizers) Meats or fish which have undergone aging, fermenting, pickling or smoking. These include: Hotdogs,salami,Lox,sausage, mortadellas,smoked salmon, pepperoni, Pickled herring Pods of broad bean (English beans, Chinese pea pods, [place] (fava) beans, lima and navy beans Ripe avocado, ripe banana Yeast extracts or active yeast preparations such as Brewer's or Fleishman's (commercial bakes goods are permitted) Tomato based foods, pizza (lasagna, etc.)   MSG (monosodium glutamate) is disguised as many things; look for these common aliases: Monopotassium glutamate Autolysed yeast Hydrolysed protein Sodium  caseinate "flavorings" "all natural preservatives" Nutrasweet   Avoid all other foods that convincingly provoke headaches.   Resources: The Dizzy [place] Laundry Your Headache Diet, migrainestrong.com  https://zamora-andrews.com/   Caffeine and Migraine For patients that have migraine, caffeine intake more than 3 days per week can lead to dependency and increased migraine frequency. I would recommend cutting back on your caffeine intake as best you can. The recommended amount of caffeine is 200-300 mg daily, although migraine patients may experience dependency at even lower doses. While you may notice an increase in headache temporarily, cutting back will be helpful for headaches in the long run. For more information on caffeine and migraine, visit: https://americanmigrainefoundation.org/resource-library/caffeine-and-migraine/   Headache Prevention Strategies:   1. Maintain a headache diary; learn to identify and avoid triggers.  - This can be a simple note where you log when you had a headache, associated symptoms, and medications used - There are several smartphone apps developed to help track migraines: Migraine Buddy, Migraine Monitor, Curelator N1-Headache App   Common triggers include: Emotional triggers: Emotional/Upset family or friends Emotional/Upset occupation Business reversal/success Anticipation anxiety Crisis-serious Post-crisis periodNew job/position   Physical triggers: Vacation Day Weekend Strenuous Exercise High Altitude Location New Move Menstrual Day Physical Illness Oversleep/Not enough sleep Weather changes Light: Photophobia or light sesnitivity treatment involves a balance between desensitization and reduction  in overly strong input. Use dark polarized glasses outside, but not inside. Avoid bright or fluorescent light, but do not dim environment to the point that going into a normally lit room hurts. Consider  FL-41 tint lenses, which reduce the most irritating wavelengths without blocking too much light.  These can be obtained at axonoptics.com or theraspecs.com Foods: see list above.   2. Limit use of acute treatments (over-the-counter medications, triptans, etc.) to no more than 2 days per week or 10 days per month to prevent medication overuse headache (rebound headache).     3. Follow a regular schedule (including weekends and holidays): Don't skip meals. Eat a balanced diet. 8 hours of sleep nightly. Minimize stress. Exercise 30 minutes per day. Being overweight is associated with a 5 times increased risk of chronic migraine. Keep well hydrated and drink 6-8 glasses of water per day.   4. Initiate non-pharmacologic measures at the earliest onset of your headache. Rest and quiet environment. Relax and reduce stress. Breathe2Relax is a free app that can instruct you on    some simple relaxtion and breathing techniques. Http://Dawnbuse.com is a    free website that provides teaching videos on relaxation.  Also, there are  many apps that   can be downloaded for "mindful" relaxation.  An app called YOGA NIDRA will help walk you through mindfulness. Another app called Calm can be downloaded to give you a structured mindfulness guide with daily reminders and skill development. Headspace for guided meditation Mindfulness Based Stress Reduction Online Course: www.palousemindfulness.com Cold compresses.   5. Don't wait!! Take the maximum allowable dosage of prescribed medication at the first sign of migraine.   6. Compliance:  Take prescribed medication regularly as directed and at the first sign of a migraine.   7. Communicate:  Call your physician when problems arise, especially if your headaches change, increase in frequency/severity, or become associated with neurological symptoms (weakness, numbness, slurred speech, etc.). Proceed to emergency room if you experience new or worsening symptoms or  symptoms do not resolve, if you have new neurologic symptoms or if headache is severe, or for any concerning symptom.   8. Headache/pain management therapies: Consider various complementary methods, including medication, behavioral therapy, psychological counselling, biofeedback, massage therapy, acupuncture, dry needling, and other modalities.  Such measures may reduce the need for medications. Counseling for pain management, where patients learn to function and ignore/minimize their pain, seems to work very well.   9. Recommend changing family's attention and focus away from patient's headaches. Instead, emphasize daily activities. If first question of day is 'How are your headaches/Do you have a headache today?', then patient will constantly think about headaches, thus making them worse. Goal is to re-direct attention away from headaches, toward daily activities and other distractions.   10. Helpful Websites: www.AmericanHeadacheSociety.org PatentHood.ch www.headaches.org TightMarket.nl www.achenet.org

## 2023-11-19 NOTE — Progress Notes (Unsigned)
 PATIENT: Molly Giles DOB: Jan 13, 1956  REASON FOR VISIT: follow up HISTORY FROM: patient  Virtual Visit via MyChart video  I connected with Molly Giles on 11/19/23 at  7:45 AM EDT via MyChart video and verified that I am speaking with the correct person using two identifiers.   I discussed the limitations, risks, security and privacy concerns of performing an evaluation and management service by Mychart video and the availability of in person appointments. I also discussed with the patient that there may be a patient responsible charge related to this service. The patient expressed understanding and agreed to proceed.   History of Present Illness:  11/19/23 ALL: Molly Giles is a 68 y.o. female here today for follow up for migraines. She was started on Vyept 10/2023. She tolerated infusion well but has not noted any significant improvement in migraine intensity or frequency.   09/27/23 ALL: Molly Giles returns for follow up for migraines. I last saw her 09/2022 and she reported worsening headaches since COVID infection 08/2022. We switched Emgality to [place] d/t needle phobia and switched rizatriptan back to eletriptan as it seemed to work better. She was referred to Dr Vickey Huger for sleep eval. She felt migraines were no better and Qulipta was expensive so she requested to switch back to [facility] 11/2022.   She was seen in follow up with Molly Ege, DNP, 03/2023 and had difficulty tolerating therapy. She wished to resume use. Insurance required in lab eval to reauthorize coverage of CPAP machine. PSG 05/2023 showed severe sleep hypopnea with strong dependence on supine sleep position. Orders placed to restart CPAP.   Since, she reports doing fair. Migraines continue fairly frequently. She usually has at least 7-8 migrainous days. Some last 2-3 days at a time. She does feel tension and morning headaches may be a little better after starting CPAP. She is waking with more energy. She dos  not ove using it and usually takes it off when waking to use the restroom. She continues working on healthy lifestyle habits. She has lost 30lbs.   Eletriptan was too expensive. She has continued rizatriptan. It does usually help.   Tried and failed: Emgality (taking now), propranolol, topiramate, nortriptyline, venlafaxine, Ajovy (ineffective), cant take Amovig (latex allergy) Celexa (on now), Namenda, Botox (ineffective, droopy eyes), Imitrex, eletriptan, rizatriptan, meloxicam, Cambia, Toradol, prednisone (caused sunburn feeling), Ubrelvy and Nurtec.     Observations/Objective:  Generalized: Well developed, in no acute distress  Mentation: Alert oriented to time, place, history taking. Follows all commands speech and language fluent   Assessment and Plan:  68 y.o. year old female  has a past medical history of Abdominal bloating (05/10/2021), Abdominal discomfort, generalized (05/07/2016), Acute gastritis (11/19/2020), Allergic rhinoconjunctivitis (07/02/2015), Anal fissure (11/19/2020), Anterior to posterior tear of superior glenoid labrum of left shoulder (02/22/2021), Arthritis, Benign positional vertigo (04/2015), Bruxism (teeth grinding), Chronic contact dermatitis (08/06/2018), Chronic migraine (02/25/2016), Chronic migraine without aura (05/15/2013), Chronic tonsillitis (01/27/2019), DDD (degenerative disc disease), cervical (11/18/2018), DDD (degenerative disc disease), lumbar, Depression, Disc displacement, lumbar, Dysphagia (10/11/2021), Dysrhythmia, Episodic cluster headache, not intractable (03/06/2017), Essential hypertension (05/15/2013), Family history of adverse reaction to anesthesia, Family history of premature CAD (05/15/2013), Fibromyalgia syndrome (07/01/2015), GERD (gastroesophageal reflux disease) (12/16/2014), H/O seasonal allergies, Hallux rigidus of right foot (09/25/2022), Hammer toe, Hashimoto's thyroiditis, Hearing loss of both ears (07/27/2015), History of  Clostridium difficile colitis (07/01/2015), History of colonic polyps, 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), History of revision of total hip arthroplasty (04/10/2018), bad fall (02/2015),  Hyperhidrosis, scalp, 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), Hyperlipidemia (1998), Hypokalemia due to excessive gastrointestinal loss of potassium (07/28/2019), Hypothyroidism, Iliopsoas bursitis of left hip (05/08/2022), Impairment of balance (02/2015), Injury of triangular fibrocartilage complex of left wrist (02/25/2019), Insulin resistance (07/04/2017), Internal hemorrhoid (01/28/2021), Interstitial cystitis, Irritable bowel syndrome with diarrhea (04/03/2016), Left ventricular hypertrophy, mild (02/25/2016), 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HCC) (03/10/2018), Lumbar facet joint pain, 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), Mood disorder (HCC), Morbid obesity (HCC) (03/06/2017), Musculoskeletal neck pain (07/14/2015), Nocturnal hypoxemia (03/06/2017), Normal coronary arteries (05/14/2014), Obesity (BMI 30.0-34.9) (01/29/2017), Odynophagia (12/20/2021), Osteoarthritis of left hip (11/01/2015), 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, lumbar region (10/30/2011), Other insomnia (11/09/2016), Overweight, Pain in joint of left shoulder (11/09/2016), Pain in joint, multiple sites (11/18/2018), Palpitations (05/15/2013), Perianal candidiasis (12/20/2021), 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), Perirectal cyst (05/07/2016), Poison ivy dermatitis (03/24/2021), PONV (postoperative nausea and vomiting), Positive ANA (antinuclear antibody) (11/18/2018), Post concussion syndrome (06/06/2015), Post concussive syndrome (07/14/2015), Posterior vitreous detachment of right eye (2014), Premature menopause (12/20/2021), Rectal abscess (05/10/2021), S/P left THA, 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), Sacroiliac inflammation (HCC) (08/18/2021), Shingles, Shortness of breath dyspnea, Sicca syndrome (HCC) (11/18/2018), Sleep walking and eating  (03/06/2017), Snoring (03/06/2017), Spondylosis of lumbar region without myelopathy or radiculopathy (10/30/2011), TFC (triangular fibrocartilage complex) injury (02/25/2019), Thyroid nodule (08/11/2009), Trochanteric bursitis of left hip, Trochanteric bursitis, right hip (04/26/2020), Tubular adenoma of colon (01/28/2021), Tubular adenoma of colon (01/28/2021), Vasomotor symptoms due to menopause (04/19/2017), Vitamin D deficiency (05/08/2017), Vulvitis (07/22/2020), and Yeast vaginitis (07/11/2019). here with    ICD-10-CM   1. Chronic migraine without aura without status migrainosus, not intractable  G43.709     2. OSA (obstructive sleep apnea)  G47.33       No orders of the defined types were placed in this encounter.   No orders of the defined types were placed in this encounter.    Follow Up Instructions:  I discussed the assessment and treatment plan with the patient. The patient was provided an opportunity to ask questions and all were answered. The patient agreed with the plan and demonstrated an understanding of the instructions.   The patient was advised to call back or seek an in-person evaluation if the symptoms worsen or if the condition fails to improve as anticipated.  I provided *** minutes of face-to-face and non face-to-face time during this MyChart video encounter. Patient located at their place of residence. Provider is in the office.    Shawnie Dapper, NP

## 2023-11-20 ENCOUNTER — Encounter: Payer: Self-pay | Admitting: *Deleted

## 2023-11-20 ENCOUNTER — Encounter: Payer: Self-pay | Admitting: Rehabilitative and Restorative Service Providers"

## 2023-11-20 ENCOUNTER — Ambulatory Visit: Payer: BLUE CROSS/BLUE SHIELD | Admitting: Rehabilitative and Restorative Service Providers"

## 2023-11-20 DIAGNOSIS — M542 Cervicalgia: Secondary | ICD-10-CM

## 2023-11-20 DIAGNOSIS — R293 Abnormal posture: Secondary | ICD-10-CM

## 2023-11-20 DIAGNOSIS — M6281 Muscle weakness (generalized): Secondary | ICD-10-CM

## 2023-11-20 NOTE — Progress Notes (Unsigned)
 Marland Kitchen

## 2023-11-20 NOTE — Therapy (Signed)
 OUTPATIENT PHYSICAL THERAPY CERVICAL TREATMENT   Patient Name: Molly Giles MRN: [id number] DOB:[birth year], 68 y.o., female Today's Date: 11/20/2023  END OF SESSION:  PT End of Session - 11/20/23 1100     Visit Number 4    Number of Visits 8    Date for PT Re-Evaluation 12/29/23    Authorization Type medicare and BCBS supplemental    Progress Note Due on Visit 10    PT Start Time 1102    PT Stop Time 1145    PT Time Calculation (min) 43 min    Activity Tolerance Patient tolerated treatment well    Behavior During Therapy WFL for tasks assessed/performed             Past Medical History:  Diagnosis Date   Abdominal bloating 05/10/2021   Abdominal discomfort, generalized 05/07/2016   Acute gastritis 11/19/2020   Allergic rhinoconjunctivitis 07/02/2015   Anal fissure 11/19/2020   Anterior to posterior tear of superior glenoid labrum of left shoulder 02/22/2021   Arthritis    Benign positional vertigo 04/2015   Responded well to [facility]   Bruxism (teeth grinding)    Chronic contact dermatitis 08/06/2018   Per allergist Dr Edwyna Ready.    Chronic migraine 02/25/2016   Per Dr Lucia Gaskins review in notes from [facility] from September 2015. Showed total headache days last month 18. Severe headache days 7 days. Moderate headache days 5 days. Mild headache days last month sick days. Days without headache last month 10 days. Symptoms associated with photophobia, phonophobia, osmophobia, neck pain, dizziness, jaw pain, nasal congestion, vision disturbances, tingling and numbness, weakness and worsening with activity. Each headache attack last 3 hours depending on treatment in severity. Left side, the right side, easier side, the frontal area in the back of the head. Characterized as throbbing, pressure, tightness, squeezing, stabbing and burning    Chronic migraine without aura 05/15/2013    Dr Lucia Gaskins [facility]   Chronic tonsillitis  01/27/2019   DDD (degenerative disc disease), cervical 11/18/2018   DDD (degenerative disc disease), lumbar    Depression    Disc displacement, lumbar    Dysphagia 10/11/2021   Dysrhythmia    seen by dr Donnie Aho- not a problem since she has been on Bystolic   Episodic cluster headache, not intractable 03/06/2017   Essential hypertension 05/15/2013   Family history of adverse reaction to anesthesia    Brother- N/V   Family history of premature CAD 05/15/2013   Fibromyalgia syndrome 07/01/2015   Management by Dr Kathie Rhodes. Devashwar (Rhe[facility])    GERD (gastroesophageal reflux disease) 12/16/2014   H/O seasonal allergies    Hallux rigidus of right foot 09/25/2022   Hammer toe    Left great toe   Hashimoto's thyroiditis    Per patient, diagnosed by Dr. Ferdinand Cava   Hearing loss of both ears 07/27/2015   mild to borderline moderate low frequency hearing loss improving to within normal limits bilaterally on audiology testing at [facility] in November 2016.     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   Hyperlipidemia 1998   Hypokalemia due to excessive gastrointestinal loss of potassium 07/28/2019   Hypothyroidism  Iliopsoas bursitis of left hip 05/08/2022   Impairment of balance 02/2015   Consequent of postconcussive syndrome   Injury of triangular fibrocartilage complex of left wrist 02/25/2019   Dx 02/25/19 Bradly Bienenstock IV MD ([facility])   Insulin resistance 07/04/2017   Internal hemorrhoid 01/28/2021   Internal hemorrhoid seen on colonoscopy 10/2020 Roderic Scarce MD [facility])   Interstitial cystitis    Irritable bowel syndrome with diarrhea 04/03/2016   Left ventricular  hypertrophy, mild 02/25/2016   ECHOcardiogram report 06/17/15 showing EF55-60%, mild LVH and G1DD    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HCC) 03/10/2018   [facility]   Lumbar facet joint pain    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   Mood disorder (HCC)    Morbid obesity (HCC) 03/06/2017   Musculoskeletal neck pain 07/14/2015   Nocturnal hypoxemia 03/06/2017   Normal coronary arteries 05/14/2014   Obesity (BMI 30.0-34.9) 01/29/2017   Odynophagia 12/20/2021   Osteoarthritis of left hip 11/01/2015   MRI order by Dr Charlann Boxer (ortho) 10/2015 showed significant arthritis of left hip joint with cystic changes in femoral head c/w osteoarthritis   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, lumbar region 10/30/2011   Other insomnia 11/09/2016   Overweight    Pain in joint of left shoulder 11/09/2016   Pain in joint, multiple sites 11/18/2018   Palpitations 05/15/2013   [facility] manages   Perianal candidiasis 12/20/2021   Periodic limb movement sleep disorder 03/28/2017   Perirectal cyst 05/07/2016   Poison ivy dermatitis 03/24/2021   PONV (postoperative nausea and vomiting)    Positive ANA (antinuclear antibody) 11/18/2018   Post concussion syndrome 06/06/2015   Post concussive syndrome 07/14/2015   Ms Barrasso's post-concussive syndrome manifesting in vertigo and headache, mood changes, poor balance, dizziness, and decreased concentration per Dr Lucia Gaskins at [facility].    Posterior vitreous detachment of right eye 2014   Premature menopause 12/20/2021   Rectal abscess 05/10/2021   S/P left THA, AA 07/25/2016   Sacroiliac inflammation (HCC) 08/18/2021   Shingles    Shortness of breath dyspnea    with exertion   Sicca syndrome (HCC) 11/18/2018   (+) ANA   Sleep walking and eating 03/06/2017   Snoring 03/06/2017   Spondylosis of lumbar region without myelopathy or radiculopathy 10/30/2011   TFC (triangular fibrocartilage complex) injury 02/25/2019   Thyroid  nodule 08/11/2009   Findings: The thyroid gland is within normal limits in size.  The gland is diffusely inhomogeneous. A small solid nodule is noted in the lower pole  medially on the right of 7 x 6 x 8 mm. A small solid nodule is noted inferiorly on the left of 3 x 3 x 4 mm.  IMPRESSION:  The thyroid gland is within normal limits in size with only small solid nodules present, the largest of only 8 mm in diameter on the right.     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    Trochanteric bursitis, right hip 04/26/2020   Tubular adenoma of colon 01/28/2021   Colonoscopy screening 4 mm tubular Adenoma polyp Charlott Rakes, MD [facility])   Tubular adenoma of colon 01/28/2021   Colonoscopy screening 4 mm tubular Adenoma polyp Charlott Rakes, MD [facility])   Vasomotor symptoms due to menopause 04/19/2017   Vitamin D deficiency 05/08/2017   Vulvitis 07/22/2020   Yeast vaginitis 07/11/2019   Past Surgical History:  Procedure Laterality Date   arthroscopy Left  01/2022   with SAD and DCR, K. Supple MD   Bladder dilitation     x 3   BREAST BIOPSY Right 2011   Benign histology   CARDIOVASCULAR STRESS TEST  2000   Unremarkable per pt report   CARPOMETACARPAL JOINT ARTHROTOMY Right 2011   COLONOSCOPY     COLONOSCOPY WITH PROPOFOL N/A 04/21/2015   Procedure: COLONOSCOPY WITH PROPOFOL;  Surgeon: Jeani Hawking, MD;  Location: [facility] ENDOSCOPY;  Service: Endoscopy;  Laterality: N/A;   CYSTOSCOPY W/ DILATION OF BLADDER N/A    EPIDURAL BLOCK INJECTION Left 04/12/2016   Left Medial Nerve Block and Left L5 ramus block, Dr Sheran Luz    EPIDURAL BLOCK INJECTION  03/21/2016   Left L3-4 medial branch block and Left L5 & dorsal ramus block    EPIDURAL BLOCK INJECTION N/A 10/25/2016   Sheran Luz, MD. Lumbar medial branch block   EPIDURAL BLOCK INJECTION N/A 02/09/2017   Sheran Luz, MD.  Bilateral L3/4 medial branch block, bilateral L5  dorsal ramus block   EPIDURAL BLOCK INJECTION N/A 07/04/2017   Sheran Luz, MD   EXCISION MORTON'S NEUROMA Right 05/03/2023   Procedure: EXCISION MORTON'S NEUROMA;  Surgeon: Toni Arthurs, MD;  Location: [facility];  Service: Orthopedics;  Laterality: Right;  general, local by surgeon 60 MIN   FECAL TRANSPLANT  04/21/2015   Procedure: FECAL TRANSPLANT;  Surgeon: Jeani Hawking, MD;  Location: [facility] ENDOSCOPY;  Service: Endoscopy;;   HIP ARTHROPLASTY Left    HIP ARTHROSCOPY Left 03/06/2018   Left hip arthroplasty, redo for loose hip arthroplasty. Procedure at [facility] hospital   INJECTION HIP INTRA ARTICULAR Left 11/2015   for OA by Dr Vilma Prader IMPLANT PLACEMENT  04/2021   Floyde Parkins MD ([facility])   INTERSTIM IMPLANT REVISION N/A 09/2021   Floyde Parkins MD ([facility])   LIGAMENT REPAIR Right 05/03/2023   Procedure: COLLATERAL LIGAMENT REPAIR;  Surgeon: Toni Arthurs, MD;  Location: [facility];  Service: Orthopedics;  Laterality: Right;  general, local by surgeon 60 MIN   OTHER SURGICAL HISTORY Left 2016   Left L3/L4 medial nerve block and Left L5 Dorsal Ramus block Dr Rosine Abe   TOTAL HIP ARTHROPLASTY Left 07/25/2016   Procedure: LEFT TOTAL HIP ARTHROPLASTY ANTERIOR APPROACH;  Surgeon: Durene Romans, MD;  Location: [facility];  Service: Orthopedics;  Laterality: Left;   WEIL OSTEOTOMY Right 05/03/2023   Procedure: RIGHT 2ND WEIL OSTEOTOMY;  Surgeon: Toni Arthurs, MD;  Location: [facility];  Service: Orthopedics;  Laterality: Right;  general, local by surgeon 60 MIN   Patient Active Problem List   Diagnosis Date Noted   Encounter for counseling 11/16/2023   Vertigo 03/19/2023   Shingles 03/19/2023   Polyphagia 03/07/2023   Rash 02/07/2023   BMI 27.0-27.9,adult 02/07/2023   Low serum vitamin B12 01/10/2023   OSA (obstructive sleep apnea) 12/27/2022   BMI 30.0-30.9,adult 12/27/2022   Severe obstructive sleep  apnea-hypopnea syndrome 12/22/2022   Hyperglycemia 12/11/2022   Depression screening 12/11/2022   Generalized obesity 12/11/2022   SOBOE (shortness of breath on exertion) 12/11/2022   Other fatigue 12/11/2022   Sleep related headaches 11/07/2022   Intractable episodic cluster headache 11/07/2022   Sleep walking and eating 11/07/2022   Ambien use disorder, 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   Psychophysiological insomnia 11/07/2022   Rhinitis 11/03/2022   Hallux rigidus of right foot 09/25/2022   Morton neuroma, right 09/25/2022   Chronic pain of right  knee 05/08/2022   Arthrosis of hand 12/20/2021   History of Clostridioides difficile colitis, required fecal transplantation 12/20/2021   Mixed hyperlipidemia 12/20/2021   Meibomian gland dysfunction (MGD) of both eyes 10/17/2021   Nuclear sclerosis of both eyes 10/17/2021   Epiretinal membrane (ERM) of left eye 10/17/2021   OAB (overactive bladder) 03/02/2021   Osteoarthritis of acromioclavicular joint 02/22/2021   Hyperhidrosis, scalp, 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   Chronic low back pain 05/14/2019   Cervical spondylosis 11/18/2018   DDD (degenerative disc disease), lumbar 11/18/2018   Keratoconjunctivitis sicca 11/18/2018   Chronic contact dermatitis 08/06/2018   Primary osteoarthritis of left knee 06/20/2018   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HCC) 11/29/2017   Insulin resistance 07/04/2017   Vitamin D deficiency 05/08/2017   Periodic limb movement sleep disorder 03/28/2017   Obesity (BMI 30.0-34.9) 01/29/2017   Other insomnia 11/09/2016   Irritable bowel syndrome with diarrhea 04/03/2016   Left ventricular hypertrophy, mild 02/25/2016   Mood disorder (HCC) 07/02/2015   Allergic rhinoconjunctivitis 07/02/2015   Fibromyalgia syndrome 07/01/2015   History of colonic polyps 07/01/2015   GERD (gastroesophageal reflux disease) 12/16/2014   Hypothyroidism 05/15/2013   Pure hypercholesterolemia 05/15/2013   Chronic interstitial cystitis 05/15/2013    Chronic migraine without aura 05/15/2013    PCP: McDiarmid, Tawanna Cooler MD REFERRING PROVIDER: McDiarmid,Todd MD REFERRING DIAG: neck pain THERAPY DIAG:  Cervicalgia  Muscle weakness (generalized)  Abnormal posture  Rationale for Evaluation and Treatment: Rehabilitation  ONSET DATE: 10/22/2023  SUBJECTIVE:                                                                                                                                                                                                         SUBJECTIVE STATEMENT: The patient reports her R glut medius is tender today and she has antalgic gait. The patient [place] [facility] and gets injections every few months for the bursitis.The patient reports she continues with 4 HA this week and one today with pain on R side of her neck.   EVALThe patient reports she began some exercises (she started a deep squat holding onto sink with back stretch and some of the prior HEP PT gave n November) and this led to significant L sided neck pain. She feels pain into her suboccipital region and notes some increased headache.  Hand dominance: Right  PERTINENT HISTORY:  Migraines, concussion, h/o vertigo, fibromyalgia  PAIN:  Are you having pain? Yes: NPRS scale: 5/10, worse with activity Pain location: L side of her neck improved; pain R side of the neck.  Pain description: neck felt locked and unable to turn to the left side Aggravating factors: activity Relieving factors: not turning to the left, and heat, voltaren, muscle realxer  PRECAUTIONS: None  WEIGHT BEARING RESTRICTIONS: No  FALLS:  Has patient fallen in last 6 months? No  LIVING ENVIRONMENT: Lives with: lives with their spouse Lives in: House/apartment  PATIENT GOALS: Reduce pain in her neck.   OBJECTIVE:  Note: Objective measures were completed at Evaluation unless otherwise noted. PATIENT SURVEYS:  NDI 40%   COGNITION: Overall cognitive status: Within  functional limits for tasks assessed  SENSATION: WFL  POSTURE: rounded shoulders and forward head  PALPATION: Tenderness suboccipitals L, deep L upper trap, upper C-spine paraspinals   CERVICAL ROM:  Active ROM A/PROM (deg) eval AROM 11/13/23  Flexion 30 deg   Extension 38 deg   Right lateral flexion 30 deg 40  Left lateral flexion 30 deg 30  Right rotation 50 deg 65  Left rotation 38 deg *most painful 60   (Blank rows = not tested)  UPPER EXTREMITY MMT:  TBA* MMT Right eval Left eval  Shoulder flexion    Shoulder extension    Shoulder abduction    Shoulder adduction    Shoulder extension    Shoulder internal rotation    Shoulder external rotation    Middle trapezius    Lower trapezius    Elbow flexion    Elbow extension    Wrist flexion    Wrist extension    Wrist ulnar deviation    Wrist radial deviation    Wrist pronation    Wrist supination    Grip strength     (Blank rows = not tested)  CERVICAL SPECIAL TESTS:  Distraction improves pain  FUNCTIONAL TESTS:  Patient demonstrated deep squat exercise provided at [facility]  [facility] Adult PT Treatment:                                                DATE: 11/20/23 Therapeutic Exercise: SNAGs and mobilization with movement for neck extension and rotation Shoulder rolls sitting Levator stretch sitting with rotation and sidebending Manual Therapy: STM for R upper trap, bilateral suboccipitals, bilateral mutifidi Gentle manual distraction supine PROM into cervical flexion with rotation for upper cervical mobility Joint mobilizations upglides, downglides and lateral glides for mid cervical spine Trigger Point Dry Needling Subsequent Treatment: Instructions reviewed, if requested by the patient, prior to subsequent dry needling treatment.  Patient Verbal Consent Given: Yes Education Handout Provided: Yes Muscles Treated:R cervical multifidi and R upper trap Treatment Response/Outcome:  palpable  lengthening, twitch response  [facility] Adult PT Treatment:                                                DATE: 11/13/23 Therapeutic Exercise: Standing Shoulder rolls Wall leans with thoracic opening adding thread the needle x 10 reps Elevated surface plank on elbows with 15 second holds x 5 reps Shoulder row with red band x 10 reps Shoulder extension with red band x 10 reps Manual Therapy: STM for L upper trap, bilateral suboccipitals, bilateral mutifidi, and scalenes Trigger Point Dry Needling Subsequent Treatment: Instructions reviewed, if requested by the patient, prior to subsequent dry needling  treatment.  Patient Verbal Consent Given: Yes Education Handout Provided: Yes Muscles Treated: L upper trap, L C3-C4 cervical multifidi Treatment Response/Outcome:  palpable lengthening, twitch response   [facility] Adult PT Treatment:                                                DATE: 11/06/23 Therapeutic Exercise: Supine Thoracic extension x 2 minutes with towel  Prone Elbow prop with rotation R and L for thoracic rotation x 10 reps Sidelying Open book x 12 reps Seated AROM rotation C-spine AROM sidebending x 3 reps with holds Neck rotation with flexion/extension SCM stretch anterior musculature Standing Shoulder extension red band x 12 reps Row with red band x 12 reps  PATIENT EDUCATION:  Education details: HEP, dry needling Person educated: Patient Education method: Programmer, multimedia, Demonstration, and Handouts Education comprehension: verbalized understanding, returned demonstration, and needs further education  HOME EXERCISE PROGRAM: Access Code: [email or web address] URL: [email or web address] Date: 11/13/2023 Prepared by: Margretta Ditty  Exercises - Standing Backward Shoulder Rolls  - 1 x daily - 7 x weekly - 1 sets - 5-10 reps - Sternocleidomastoid Stretch  - 2 x daily - 7 x weekly - 1 sets - 10 reps - Seated Cervical Sidebending AROM  - 2 x daily - 7 x weekly - 1 sets -  10 reps - Seated Cervical Rotation AROM  - 1 x daily - 5 x weekly - 1 sets - 5 reps - Sidelying Open Book Thoracic Lumbar Rotation and Extension  - 1 x daily - 5 x weekly - 1 sets - 10 reps - Standing with Forearms Thoracic Rotation  - 1 x daily - 4 x weekly - 1 sets - 10 reps - Plank with Elbows on Table  - 1 x daily - 4 x weekly - 2 sets - 5 reps - 15 seconds hold - Standing Shoulder Row with Anchored Resistance  - 1 x daily - 3 x weekly - 2 sets - 10 reps - Single Arm Shoulder Extension with Anchored Resistance  - 1 x daily - 3 x weekly - 2 sets - 10 reps - Squat with Chair Touch  - 2 x daily - 3 x weekly - 1 sets - 10 reps -  ASSESSMENT:  CLINICAL IMPRESSION: The patient's left side of cervical spine improved and ROM improved. She continues with Headaches and tightness more on R side today. PT discussed ocntinuation of current HEP and added stretching and SNAGS to tolerance for home.  PT to progress to STGs.    EVAL:Patient is a 68 y.o. female who was seen today for physical therapy evaluation and treatment for neck pain. She has h/o chronic neck pain with general postural rounding and weakness. PT to address pain, dec'd ROM, and hypomobility. Plan to also discuss post d/c strengthening and exercises that can strengthen without flaring pain.    OBJECTIVE IMPAIRMENTS: decreased ROM, decreased strength, increased muscle spasms, impaired flexibility, impaired tone, and pain.   GOALS: Goals reviewed with patient? Yes  SHORT TERM GOALS: Target date: 11/27/23  The patient will be indep with HEP. Baseline:  initiated at eval Goal status: INITIAL  2.  The patient will improve NDI to < or equal to 30% Baseline:  40% Goal status: INITIAL  3.  The patient will report resting pain < or equal to 2/10. Baseline: 5/10 today Goal status:  INITIAL  LONG TERM GOALS: Target date: 12/28/23  The patient will be indep with HEP progression Baseline: initiated at eval Goal status: INITIAL  2.  The  patient will improve NDI to < or equal to 20%. Baseline:  40% Goal status: INITIAL  3.  The patient will improve AROM c-spine rotation to 55 degrees bilaterally. Baseline: see above Goal status: INITIAL  4.  The patient will verbalize post d/c HEP progression. Baseline:  does not have regular routine-- working on establishing Goal status: INITIAL  PLAN:  PT FREQUENCY: 1-2x/week  PT DURATION: 8 weeks  PLANNED INTERVENTIONS: 97164- PT Re-evaluation, 97110-Therapeutic exercises, 97530- Therapeutic activity, [id number]- Neuromuscular re-education, 97535- Self Care, 75643- Manual therapy, [id number]- Electrical stimulation (unattended), Patient/Family education, Taping, Dry Needling, Cryotherapy, and Moist heat  PLAN FOR NEXT SESSION: progress exercises, STM/manual/DN as indicated, postural strengthening.  Recheck NDI and begin checking STGs.   Calven Gilkes, PT 11/20/2023, 11:01 AM

## 2023-11-21 ENCOUNTER — Encounter: Payer: Self-pay | Admitting: Family Medicine

## 2023-11-21 ENCOUNTER — Telehealth (INDEPENDENT_AMBULATORY_CARE_PROVIDER_SITE_OTHER): Admitting: Family Medicine

## 2023-11-21 DIAGNOSIS — G43709 Chronic migraine without aura, not intractable, without status migrainosus: Secondary | ICD-10-CM

## 2023-11-21 DIAGNOSIS — G4733 Obstructive sleep apnea (adult) (pediatric): Secondary | ICD-10-CM | POA: Diagnosis not present

## 2023-11-21 NOTE — Telephone Encounter (Signed)
Gave signed order below to intrafusion:    

## 2023-12-10 ENCOUNTER — Encounter

## 2023-12-13 ENCOUNTER — Encounter: Payer: Self-pay | Admitting: Rehabilitative and Restorative Service Providers"

## 2023-12-13 ENCOUNTER — Ambulatory Visit: Attending: Family Medicine | Admitting: Rehabilitative and Restorative Service Providers"

## 2023-12-13 ENCOUNTER — Ambulatory Visit (INDEPENDENT_AMBULATORY_CARE_PROVIDER_SITE_OTHER): Payer: Medicare Other | Admitting: Family Medicine

## 2023-12-13 DIAGNOSIS — M542 Cervicalgia: Secondary | ICD-10-CM | POA: Insufficient documentation

## 2023-12-13 DIAGNOSIS — R293 Abnormal posture: Secondary | ICD-10-CM | POA: Diagnosis present

## 2023-12-13 DIAGNOSIS — M6281 Muscle weakness (generalized): Secondary | ICD-10-CM | POA: Insufficient documentation

## 2023-12-13 NOTE — Therapy (Signed)
 OUTPATIENT PHYSICAL THERAPY CERVICAL TREATMENT AND DISCHARGE SUMMARY   Patient Name: Molly Giles MRN: [id number] DOB:03-28-1956, 68 y.o., female Today's Date: 12/13/2023   PHYSICAL THERAPY DISCHARGE SUMMARY  Visits from Start of Care: 5  Current functional level related to goals / functional outcomes: See goals below-- all met   Remaining deficits: H/o chronic migraines and neck tightness associated with migraines    Education / Equipment: HEP General wellness plan   Patient agrees to discharge. Patient goals were met. Patient is being discharged due to meeting the stated rehab goals.  END OF SESSION:  PT End of Session - 12/13/23 1200     Visit Number 5    Number of Visits 8    Date for PT Re-Evaluation 12/29/23    Authorization Type medicare and BCBS supplemental    Progress Note Due on Visit 10    PT Start Time 1200    PT Stop Time 1230    PT Time Calculation (min) 30 min    Activity Tolerance Patient tolerated treatment well    Behavior During Therapy [facility] for tasks assessed/performed              Past Medical History:  Diagnosis Date   Abdominal bloating 05/10/2021   Abdominal discomfort, generalized 05/07/2016   Acute gastritis 11/19/2020   Allergic rhinoconjunctivitis 07/02/2015   Anal fissure 11/19/2020   Anterior to posterior tear of superior glenoid labrum of left shoulder 02/22/2021   Arthritis    Benign positional vertigo 04/2015   Responded well to [facility]   Bruxism (teeth grinding)    Chronic contact dermatitis 08/06/2018   Per allergist Dr Edwyna Ready.    Chronic migraine 02/25/2016   Per Dr Lucia Gaskins review in notes from [facility] from September 2015. Showed total headache days last month 18. Severe headache days 7 days. Moderate headache days 5 days. Mild headache days last month sick days. Days without headache last month 10 days. Symptoms associated with photophobia, phonophobia, osmophobia, neck pain,  dizziness, jaw pain, nasal congestion, vision disturbances, tingling and numbness, weakness and worsening with activity. Each headache attack last 3 hours depending on treatment in severity. Left side, the right side, easier side, the frontal area in the back of the head. Characterized as throbbing, pressure, tightness, squeezing, stabbing and burning    Chronic migraine without aura 05/15/2013    Dr Lucia Gaskins [facility]   Chronic tonsillitis 01/27/2019   DDD (degenerative disc disease), cervical 11/18/2018   DDD (degenerative disc disease), lumbar    Depression    Disc displacement, lumbar    Dysphagia 10/11/2021   Dysrhythmia    seen by dr Donnie Aho- not a problem since she has been on Bystolic   Episodic cluster headache, not intractable 03/06/2017   Essential hypertension 05/15/2013   Family history of adverse reaction to anesthesia    Brother- N/V   Family history of premature CAD 05/15/2013   Fibromyalgia syndrome 07/01/2015   Management by Dr Kathie Rhodes. Devashwar (Rheum)    GERD (gastroesophageal reflux disease) 12/16/2014   H/O seasonal allergies    Hallux rigidus of right foot 09/25/2022   Hammer toe    Left great toe   Hashimoto's thyroiditis    Per patient, diagnosed by Dr. Ferdinand Cava   Hearing loss of both ears 07/27/2015   mild to borderline moderate low frequency hearing loss improving to within normal limits bilaterally on audiology testing at [facility] in November 2016.     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   Hyperlipidemia 1998   Hypokalemia due to excessive gastrointestinal loss of potassium 07/28/2019   Hypothyroidism    Iliopsoas bursitis of  left hip 05/08/2022   Impairment of balance 02/2015   Consequent of postconcussive syndrome   Injury of triangular fibrocartilage complex of left wrist 02/25/2019   Dx 02/25/19 Bradly Bienenstock IV MD ([facility])   Insulin resistance 07/04/2017   Internal hemorrhoid 01/28/2021   Internal hemorrhoid seen on colonoscopy 10/2020 Roderic Scarce MD [facility])   Interstitial cystitis    Irritable bowel syndrome with diarrhea 04/03/2016   Left ventricular hypertrophy, mild 02/25/2016   ECHOcardiogram report 06/17/15 showing EF55-60%, mild LVH and G1DD    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HCC) 03/10/2018   [facility]   Lumbar facet joint pain    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   Mood disorder (HCC)    Morbid obesity (HCC) 03/06/2017   Musculoskeletal neck pain 07/14/2015   Nocturnal hypoxemia 03/06/2017   Normal coronary arteries 05/14/2014   Obesity (BMI 30.0-34.9) 01/29/2017   Odynophagia 12/20/2021   Osteoarthritis of left hip 11/01/2015   MRI order by Dr Charlann Boxer (ortho) 10/2015 showed significant arthritis of left hip joint with cystic changes in femoral head c/w osteoarthritis   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, lumbar region 10/30/2011   Other insomnia 11/09/2016   Overweight    Pain in joint of left shoulder 11/09/2016   Pain in joint, multiple sites 11/18/2018   Palpitations 05/15/2013   [facility] manages   Perianal candidiasis 12/20/2021   Periodic limb movement sleep disorder 03/28/2017   Perirectal cyst 05/07/2016   Poison ivy dermatitis 03/24/2021   PONV (postoperative nausea and vomiting)    Positive ANA (antinuclear antibody) 11/18/2018   Post concussion syndrome 06/06/2015   Post concussive syndrome 07/14/2015   Ms Jeon's post-concussive syndrome manifesting in vertigo and headache, mood changes, poor balance, dizziness, and decreased concentration per Dr Lucia Gaskins at [facility].    Posterior vitreous detachment of right eye 2014    Premature menopause 12/20/2021   Rectal abscess 05/10/2021   S/P left THA, AA 07/25/2016   Sacroiliac inflammation (HCC) 08/18/2021   Shingles    Shortness of breath dyspnea    with exertion   Sicca syndrome (HCC) 11/18/2018   (+) ANA   Sleep walking and eating 03/06/2017   Snoring 03/06/2017   Spondylosis of lumbar region without myelopathy or radiculopathy 10/30/2011   TFC (triangular fibrocartilage complex) injury 02/25/2019   Thyroid nodule 08/11/2009   Findings: The thyroid gland is within normal limits in size.  The gland is diffusely inhomogeneous. A small solid nodule is noted in the lower pole  medially on the right of 7 x 6 x 8 mm. A small solid nodule is noted inferiorly on the left of 3 x 3 x 4 mm.  IMPRESSION:  The thyroid gland is within normal limits in size with only small solid nodules present, the largest of only 8 mm in diameter on the right.     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    Trochanteric bursitis, right hip 04/26/2020  Tubular adenoma of colon 01/28/2021   Colonoscopy screening 4 mm tubular Adenoma polyp Charlott Rakes, MD [facility])   Tubular adenoma of colon 01/28/2021   Colonoscopy screening 4 mm tubular Adenoma polyp Charlott Rakes, MD [facility])   Vasomotor symptoms due to menopause 04/19/2017   Vitamin D deficiency 05/08/2017   Vulvitis 07/22/2020   Yeast vaginitis 07/11/2019   Past Surgical History:  Procedure Laterality Date   arthroscopy Left 01/2022   with SAD and DCR, K. Supple MD   Bladder dilitation     x 3   BREAST BIOPSY Right 2011   Benign histology   CARDIOVASCULAR STRESS TEST  2000   Unremarkable per pt report   CARPOMETACARPAL JOINT ARTHROTOMY Right 2011   COLONOSCOPY     COLONOSCOPY WITH PROPOFOL N/A 04/21/2015   Procedure: COLONOSCOPY WITH PROPOFOL;  Surgeon: Jeani Hawking, MD;  Location: [facility] ENDOSCOPY;  Service: Endoscopy;  Laterality: N/A;   CYSTOSCOPY W/  DILATION OF BLADDER N/A    EPIDURAL BLOCK INJECTION Left 04/12/2016   Left Medial Nerve Block and Left L5 ramus block, Dr Sheran Luz    EPIDURAL BLOCK INJECTION  03/21/2016   Left L3-4 medial branch block and Left L5 & dorsal ramus block    EPIDURAL BLOCK INJECTION N/A 10/25/2016   Sheran Luz, MD. Lumbar medial branch block   EPIDURAL BLOCK INJECTION N/A 02/09/2017   Sheran Luz, MD.  Bilateral L3/4 medial branch block, bilateral L5 dorsal ramus block   EPIDURAL BLOCK INJECTION N/A 07/04/2017   Sheran Luz, MD   EXCISION MORTON'S NEUROMA Right 05/03/2023   Procedure: EXCISION MORTON'S NEUROMA;  Surgeon: Toni Arthurs, MD;  Location: [facility];  Service: Orthopedics;  Laterality: Right;  general, local by surgeon 60 MIN   FECAL TRANSPLANT  04/21/2015   Procedure: FECAL TRANSPLANT;  Surgeon: Jeani Hawking, MD;  Location: [facility] ENDOSCOPY;  Service: Endoscopy;;   HIP ARTHROPLASTY Left    HIP ARTHROSCOPY Left 03/06/2018   Left hip arthroplasty, redo for loose hip arthroplasty. Procedure at [facility]   INJECTION HIP INTRA ARTICULAR Left 11/2015   for OA by Dr Vilma Prader IMPLANT PLACEMENT  04/2021   Floyde Parkins MD ([facility])   INTERSTIM IMPLANT REVISION N/A 09/2021   Floyde Parkins MD ([facility])   LIGAMENT REPAIR Right 05/03/2023   Procedure: COLLATERAL LIGAMENT REPAIR;  Surgeon: Toni Arthurs, MD;  Location: [facility];  Service: Orthopedics;  Laterality: Right;  general, local by surgeon 60 MIN   OTHER SURGICAL HISTORY Left 2016   Left L3/L4 medial nerve block and Left L5 Dorsal Ramus block Dr Rosine Abe   TOTAL HIP ARTHROPLASTY Left 07/25/2016   Procedure: LEFT TOTAL HIP ARTHROPLASTY ANTERIOR APPROACH;  Surgeon: Durene Romans, MD;  Location: [facility];  Service: Orthopedics;  Laterality: Left;   WEIL OSTEOTOMY Right 05/03/2023   Procedure: RIGHT 2ND WEIL OSTEOTOMY;  Surgeon: Toni Arthurs, MD;  Location: [facility];  Service: Orthopedics;  Laterality: Right;  general, local by surgeon 60 MIN   Patient Active Problem List   Diagnosis Date Noted   Encounter for counseling 11/16/2023   Vertigo 03/19/2023   Shingles 03/19/2023   Polyphagia 03/07/2023   Rash 02/07/2023   BMI 27.0-27.9,adult 02/07/2023   Low serum vitamin B12 01/10/2023   OSA (obstructive sleep apnea) 12/27/2022   BMI 30.0-30.9,adult 12/27/2022   Severe obstructive sleep apnea-hypopnea syndrome 12/22/2022   Hyperglycemia 12/11/2022   Depression screening 12/11/2022  Generalized obesity 12/11/2022   SOBOE (shortness of breath on exertion) 12/11/2022   Other fatigue 12/11/2022   Sleep related headaches 11/07/2022   Intractable episodic cluster headache 11/07/2022   Sleep walking and eating 11/07/2022   Ambien use disorder, 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   Psychophysiological insomnia 11/07/2022   Rhinitis 11/03/2022   Hallux rigidus of right foot 09/25/2022   Morton neuroma, right 09/25/2022   Chronic pain of right knee 05/08/2022   Arthrosis of hand 12/20/2021   History of Clostridioides difficile colitis, required fecal transplantation 12/20/2021   Mixed hyperlipidemia 12/20/2021   Meibomian gland dysfunction (MGD) of both eyes 10/17/2021   Nuclear sclerosis of both eyes 10/17/2021   Epiretinal membrane (ERM) of left eye 10/17/2021   OAB (overactive bladder) 03/02/2021   Osteoarthritis of acromioclavicular joint 02/22/2021   Hyperhidrosis, scalp, 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   Chronic low back pain 05/14/2019   Cervical spondylosis 11/18/2018   DDD (degenerative disc disease), lumbar 11/18/2018   Keratoconjunctivitis sicca 11/18/2018   Chronic contact dermatitis 08/06/2018   Primary osteoarthritis of left knee 06/20/2018   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HCC) 11/29/2017   Insulin resistance 07/04/2017   Vitamin D deficiency 05/08/2017   Periodic limb movement sleep disorder 03/28/2017   Obesity (BMI 30.0-34.9) 01/29/2017    Other insomnia 11/09/2016   Irritable bowel syndrome with diarrhea 04/03/2016   Left ventricular hypertrophy, mild 02/25/2016   Mood disorder (HCC) 07/02/2015   Allergic rhinoconjunctivitis 07/02/2015   Fibromyalgia syndrome 07/01/2015   History of colonic polyps 07/01/2015   GERD (gastroesophageal reflux disease) 12/16/2014   Hypothyroidism 05/15/2013   Pure hypercholesterolemia 05/15/2013   Chronic interstitial cystitis 05/15/2013   Chronic migraine without aura 05/15/2013    PCP: McDiarmid, Tawanna Cooler MD REFERRING PROVIDER: McDiarmid,Todd MD REFERRING DIAG: neck pain THERAPY DIAG:  Cervicalgia  Muscle weakness (generalized)  Abnormal posture  Rationale for Evaluation and Treatment: Rehabilitation  ONSET DATE: 10/22/2023  SUBJECTIVE:                                                                                                                                                                                                         SUBJECTIVE STATEMENT: The patient reports no increased pain with neck rotation. She does have HA and R upper trap tightness today rated a 5/10. She went a week without HA and then developed one yesterday that is present R frontal HA today.   EVALThe patient reports she began some exercises (she started a deep squat holding onto sink with back stretch and some of the prior HEP PT gave n November)  and this led to significant L sided neck pain. She feels pain into her suboccipital region and notes some increased headache.  Hand dominance: Right  PERTINENT HISTORY:  Migraines, concussion, h/o vertigo, fibromyalgia  PAIN:  Are you having pain? Yes: NPRS scale: 5/10, worse with activity (on R side today) Pain location: R side of her neck Pain description: neck felt locked and unable to turn to the left side Aggravating factors: activity Relieving factors: not turning to the left, and heat, voltaren, muscle realxer  PRECAUTIONS: None  WEIGHT BEARING  RESTRICTIONS: No  FALLS:  Has patient fallen in last 6 months? No  PATIENT GOALS: Reduce pain in her neck.   OBJECTIVE:  Note: Objective measures were completed at Evaluation unless otherwise noted. PATIENT SURVEYS:  NDI 40%   COGNITION: Overall cognitive status: Within functional limits for tasks assessed  SENSATION: WFL  POSTURE: rounded shoulders and forward head  PALPATION: Tenderness suboccipitals L, deep L upper trap, upper C-spine paraspinals   CERVICAL ROM:  Active ROM A/PROM (deg) eval AROM 11/13/23 AROM 12/13/23  Flexion 30 deg    Extension 38 deg    Right lateral flexion 30 deg 40   Left lateral flexion 30 deg 30   Right rotation 50 deg 65 64  Left rotation 38 deg *most painful 60 72   (Blank rows = not tested)  UPPER EXTREMITY MMT:  TBA* MMT Right eval Left eval  Shoulder flexion    Shoulder extension    Shoulder abduction    Shoulder adduction    Shoulder extension    Shoulder internal rotation    Shoulder external rotation    Middle trapezius    Lower trapezius    Elbow flexion    Elbow extension    Wrist flexion    Wrist extension    Wrist ulnar deviation    Wrist radial deviation    Wrist pronation    Wrist supination    Grip strength     (Blank rows = not tested)  CERVICAL SPECIAL TESTS:  Distraction improves pain  FUNCTIONAL TESTS:  Patient demonstrated deep squat exercise provided at [facility]   [facility] Adult PT Treatment:                                                DATE: 12/13/23 Therapeutic Exercise: Verbally reviewed HEP and modified plank to be at wall due to not doing one at counter Discussed HEP post d/c and ways to increase compliance Reviewed exercise recommendations for 150 minutes/week of cardio (recommended start small due to hip/foot/migraines at 10 min/day of walking), and strength training 2 days/week Wall leans with knees into flexion x 5 reps Seated neck AROM and gentle stretching Manual  Therapy: STM bilateral upper trap, scalenes Gentle manual distraction x 5 reps x 20 second holds  [facility] Adult PT Treatment:                                                DATE: 11/20/23 Therapeutic Exercise: SNAGs and mobilization with movement for neck extension and rotation Shoulder rolls sitting Levator stretch sitting with rotation and sidebending Manual Therapy: STM for R upper trap, bilateral suboccipitals, bilateral mutifidi Gentle manual distraction supine PROM  into cervical flexion with rotation for upper cervical mobility Joint mobilizations upglides, downglides and lateral glides for mid cervical spine Trigger Point Dry Needling Subsequent Treatment: Instructions reviewed, if requested by the patient, prior to subsequent dry needling treatment.  Patient Verbal Consent Given: Yes Education Handout Provided: Yes Muscles Treated:R cervical multifidi and R upper trap Treatment Response/Outcome:  palpable lengthening, twitch response  [facility] Adult PT Treatment:                                                DATE: 11/13/23 Therapeutic Exercise: Standing Shoulder rolls Wall leans with thoracic opening adding thread the needle x 10 reps Elevated surface plank on elbows with 15 second holds x 5 reps Shoulder row with red band x 10 reps Shoulder extension with red band x 10 reps Manual Therapy: STM for L upper trap, bilateral suboccipitals, bilateral mutifidi, and scalenes Trigger Point Dry Needling Subsequent Treatment: Instructions reviewed, if requested by the patient, prior to subsequent dry needling treatment.  Patient Verbal Consent Given: Yes Education Handout Provided: Yes Muscles Treated: L upper trap, L C3-C4 cervical multifidi Treatment Response/Outcome:  palpable lengthening, twitch response   [facility] Adult PT Treatment:                                                DATE: 11/06/23 Therapeutic Exercise: Supine Thoracic extension x 2 minutes with towel  Prone Elbow prop  with rotation R and L for thoracic rotation x 10 reps Sidelying Open book x 12 reps Seated AROM rotation C-spine AROM sidebending x 3 reps with holds Neck rotation with flexion/extension SCM stretch anterior musculature Standing Shoulder extension red band x 12 reps Row with red band x 12 reps  PATIENT EDUCATION:  Education details: HEP, dry needling Person educated: Patient Education method: Programmer, multimedia, Demonstration, and Handouts Education comprehension: verbalized understanding, returned demonstration, and needs further education  HOME EXERCISE PROGRAM: Access Code: [email or web address] URL: [email or web address] Date: 12/13/2023 Prepared by: Margretta Ditty  Exercises - Standing Backward Shoulder Rolls  - 1 x daily - 7 x weekly - 1 sets - 5-10 reps - Sternocleidomastoid Stretch  - 2 x daily - 7 x weekly - 1 sets - 10 reps - Seated Cervical Sidebending AROM  - 2 x daily - 7 x weekly - 1 sets - 10 reps - Upper Cervical Extension SNAG with Strap  - 1 x daily - 4 x weekly - 1 sets - 5 reps - Seated Cervical Rotation AROM  - 1 x daily - 5 x weekly - 1 sets - 5 reps - Gentle Levator Scapulae Stretch  - 1 x daily - 4 x weekly - 1 sets - 3 reps - 10 seconds hold - Sidelying Open Book Thoracic Lumbar Rotation and Extension  - 1 x daily - 5 x weekly - 1 sets - 10 reps - Standing with Forearms Thoracic Rotation  - 1 x daily - 4 x weekly - 1 sets - 10 reps - Forearm Plank with Hip Flexion/Adduction Knee Drive at Wall  - 1 x daily - 7 x weekly - 1 sets - 10 reps - Standing Shoulder Row with Anchored Resistance  - 1 x daily - 3 x weekly - 2 sets -  10 reps - Single Arm Shoulder Extension with Anchored Resistance  - 1 x daily - 3 x weekly - 2 sets - 10 reps - Squat with Chair Touch  - 2 x daily - 3 x weekly - 1 sets - 10 reps -  ASSESSMENT:  CLINICAL IMPRESSION: The patient has met LTGs. Patient has HEP for post d/c and we discussed ways to improve compliance.     EVAL:Patient is a  68 y.o. female who was seen today for physical therapy evaluation and treatment for neck pain. She has h/o chronic neck pain with general postural rounding and weakness. PT to address pain, dec'd ROM, and hypomobility. Plan to also discuss post d/c strengthening and exercises that can strengthen without flaring pain.    OBJECTIVE IMPAIRMENTS: decreased ROM, decreased strength, increased muscle spasms, impaired flexibility, impaired tone, and pain.   GOALS: Goals reviewed with patient? Yes  SHORT TERM GOALS: Target date: 11/27/23  The patient will be indep with HEP. Baseline:  initiated at eval Goal status: NOT MET  2.  The patient will improve NDI to < or equal to 30% Baseline:  40% Goal status:met--14% AT D/C  3.  The patient will report resting pain < or equal to 2/10. Baseline: 5/10 today --at eval in L; L side resolved. Has R sided pain today related to headache/migraine Goal status: MET FOR left side today  LONG TERM GOALS: Target date: 12/28/23  The patient will be indep with HEP progression Baseline: initiated at eval Goal status: MET  2.  The patient will improve NDI to < or equal to 20%. Baseline:  40% Goal status: MET-15% at D/C   3.  The patient will improve AROM c-spine rotation to 55 degrees bilaterally. Baseline: see above Goal status: MET  4.  The patient will verbalize post d/c HEP progression. Baseline:  does not have regular routine-- working on establishing Goal status: MET  PLAN:  PT FREQUENCY: 1-2x/week  PT DURATION: 8 weeks  PLANNED INTERVENTIONS: 97164- PT Re-evaluation, 97110-Therapeutic exercises, 97530- Therapeutic activity, [id number]- Neuromuscular re-education, 97535- Self Care, 16109- Manual therapy, [id number]- Electrical stimulation (unattended), Patient/Family education, Taping, Dry Needling, Cryotherapy, and Moist heat  PLAN FOR NEXT SESSION: discharge   Mckenlee Mangham, PT 12/13/2023, 12:49 PM

## 2023-12-19 ENCOUNTER — Other Ambulatory Visit: Payer: Self-pay

## 2023-12-19 ENCOUNTER — Encounter (HOSPITAL_COMMUNITY): Payer: Self-pay | Admitting: Psychiatry

## 2023-12-19 ENCOUNTER — Ambulatory Visit (HOSPITAL_BASED_OUTPATIENT_CLINIC_OR_DEPARTMENT_OTHER): Payer: BLUE CROSS/BLUE SHIELD | Admitting: Psychiatry

## 2023-12-19 VITALS — BP 117/93 | HR 88 | Wt 161.0 lb

## 2023-12-19 DIAGNOSIS — F4323 Adjustment disorder with mixed anxiety and depressed mood: Secondary | ICD-10-CM | POA: Diagnosis not present

## 2023-12-19 MED ORDER — ZOLPIDEM TARTRATE 5 MG PO TABS: 5.0000 mg | ORAL_TABLET | Freq: Every evening | ORAL | 5 refills | Status: DC | PRN

## 2023-12-19 MED ORDER — LORAZEPAM 0.5 MG PO TABS: ORAL_TABLET | ORAL | 1 refills | Status: DC

## 2023-12-19 NOTE — Progress Notes (Signed)
 Psychiatric Initial Adult Assessment   Patient Identification: Molly Giles MRN:  [id number] Date of Evaluation:  12/19/2023 Referral Source:  Chief Complaint:   Visit Diagnosis: Adjustment disorder  History of Present Illness: Dr. Awilda Bill    Today the patient is doing very well.  Her mood is good.  There is less conflict with her daughter-in-law and with the rest of the family.  The patient's husband is good.  The only medical problem she has is restless with migraine headaches.  Patient is having difficulty with her CPAP machine.  Overall though she denies daily depression or significant anxiety.  The 5 mg of Ambien is helpful with the majority of the time but occasionally she will be 0.5 mg of Ativan as needed.  The patient gets Celexa from her primary care doctor.  She made attempts to come off bit but had great problems.  For now her primary care doctor will continue prescribing.  Overall I think the patient is doing well.  He has had some minor medical problems including some podiatry problems.  The patient has lost 45 pounds in the last 1 year.  She feels good about it.  Her emotions are stable. Associated Signs/Symptoms: Depression Symptoms:  anxiety, (Hypo) Manic Symptoms:   Anxiety Symptoms:  Excessive Worry, Psychotic Symptoms:  PTSD Symptoms: NA  Past Psychiatric History:   Previous Psychotropic Medications: Yes   Substance Abuse History in the last 12 months:  No.  Consequences of Substance Abuse: NA  Past Medical History:  Past Medical History:  Diagnosis Date   Abdominal bloating 05/10/2021   Abdominal discomfort, generalized 05/07/2016   Acute gastritis 11/19/2020   Allergic rhinoconjunctivitis 07/02/2015   Anal fissure 11/19/2020   Anterior to posterior tear of superior glenoid labrum of left shoulder 02/22/2021   Arthritis    Benign positional vertigo 04/2015   Responded well to [facility]   Bruxism (teeth grinding)    Chronic contact  dermatitis 08/06/2018   Per allergist Dr Edwyna Ready.    Chronic migraine 02/25/2016   Per Dr Lucia Gaskins review in notes from [facility] from September 2015. Showed total headache days last month 18. Severe headache days 7 days. Moderate headache days 5 days. Mild headache days last month sick days. Days without headache last month 10 days. Symptoms associated with photophobia, phonophobia, osmophobia, neck pain, dizziness, jaw pain, nasal congestion, vision disturbances, tingling and numbness, weakness and worsening with activity. Each headache attack last 3 hours depending on treatment in severity. Left side, the right side, easier side, the frontal area in the back of the head. Characterized as throbbing, pressure, tightness, squeezing, stabbing and burning    Chronic migraine without aura 05/15/2013    [facility]   Chronic tonsillitis 01/27/2019   DDD (degenerative disc disease), cervical 11/18/2018   DDD (degenerative disc disease), lumbar    Depression    Disc displacement, lumbar    Dysphagia 10/11/2021   Dysrhythmia    seen by dr Donnie Aho- not a problem since she has been on Bystolic   Episodic cluster headache, not intractable 03/06/2017   Essential hypertension 05/15/2013   Family history of adverse reaction to anesthesia    Brother- N/V   Family history of premature CAD 05/15/2013   Fibromyalgia syndrome 07/01/2015   Management by Dr Kathie Rhodes. Devashwar ([facility]he[facility])    GERD (gastroesophageal reflux disease) 12/16/2014   H/O seasonal allergies    Hallux rigidus of right foot 09/25/2022   Hammer toe  Left great toe   Hashimoto's thyroiditis    Per patient, diagnosed by Dr. Ferdinand Cava   Hearing loss of both ears 07/27/2015   mild to borderline moderate low frequency hearing loss improving to within normal limits bilaterally on audiology testing at [facility] in November 2016.     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   Hyperlipidemia 1998   Hypokalemia due to excessive gastrointestinal loss of potassium 07/28/2019   Hypothyroidism    Iliopsoas bursitis of left hip 05/08/2022   Impairment of balance 02/2015   Consequent of postconcussive syndrome   Injury of triangular fibrocartilage complex of left wrist 02/25/2019   Dx 02/25/19 Bradly Bienenstock IV MD ([facility])   Insulin resistance 07/04/2017   Internal hemorrhoid 01/28/2021   Internal hemorrhoid seen on colonoscopy 10/2020 Roderic Scarce MD [facility])   Interstitial cystitis    Irritable bowel syndrome with diarrhea 04/03/2016   Left ventricular hypertrophy, mild 02/25/2016   ECHOcardiogram report 06/17/15 showing EF55-60%, mild LVH and G1DD    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HCC) 03/10/2018   [facility]   Lumbar facet joint pain    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   Mood disorder (HCC)    Morbid obesity (HCC) 03/06/2017   Musculoskeletal neck pain 07/14/2015   Nocturnal hypoxemia 03/06/2017   Normal coronary arteries 05/14/2014   Obesity (BMI 30.0-34.9) 01/29/2017   Odynophagia 12/20/2021   Osteoarthritis of left hip 11/01/2015   MRI order by Dr Charlann Boxer (ortho) 10/2015 showed significant arthritis of left hip joint with cystic changes in femoral head c/w osteoarthritis   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, lumbar region 10/30/2011   Other insomnia 11/09/2016   Overweight    Pain in joint of left shoulder 11/09/2016   Pain in joint, multiple sites 11/18/2018   Palpitations 05/15/2013   [facility] manages   Perianal candidiasis 12/20/2021   Periodic limb movement sleep disorder 03/28/2017   Perirectal cyst 05/07/2016    Poison ivy dermatitis 03/24/2021   PONV (postoperative nausea and vomiting)    Positive ANA (antinuclear antibody) 11/18/2018   Post concussion syndrome 06/06/2015   Post concussive syndrome 07/14/2015   Ms Brizzi's post-concussive syndrome manifesting in vertigo and headache, mood changes, poor balance, dizziness, and decreased concentration per Dr Lucia Gaskins at [facility].    Posterior vitreous detachment of right eye 2014   Premature menopause 12/20/2021   Rectal abscess 05/10/2021   S/P left THA, AA 07/25/2016   Sacroiliac inflammation (HCC) 08/18/2021   Shingles    Shortness of breath dyspnea    with exertion   Sicca syndrome (HCC) 11/18/2018   (+) ANA   Sleep walking and eating 03/06/2017   Snoring 03/06/2017   Spondylosis of lumbar region without myelopathy or radiculopathy 10/30/2011   TFC (triangular fibrocartilage complex) injury 02/25/2019   Thyroid nodule 08/11/2009   Findings: The thyroid gland is within normal limits in size.  The gland is diffusely inhomogeneous. A small solid nodule is noted in the lower pole  medially on the right of 7 x 6 x 8 mm. A small solid nodule is noted inferiorly on the left of 3 x 3 x 4 mm.  IMPRESSION:  The thyroid gland is within normal limits in size with only small solid nodules present, the largest of only 8 mm in diameter on the right.     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    Trochanteric bursitis, right hip 04/26/2020   Tubular adenoma of colon 01/28/2021   Colonoscopy screening 4 mm tubular Adenoma polyp Charlott Rakes, MD [facility])   Tubular adenoma of colon 01/28/2021   Colonoscopy screening 4 mm tubular Adenoma polyp Charlott Rakes, MD [facility])   Vasomotor symptoms due to menopause 04/19/2017   Vitamin D deficiency 05/08/2017   Vulvitis 07/22/2020   Yeast vaginitis 07/11/2019    Past Surgical History:  Procedure Laterality Date   arthroscopy Left 01/2022    with SAD and DCR, K. Supple MD   Bladder dilitation     x 3   BREAST BIOPSY Right 2011   Benign histology   CARDIOVASCULAR STRESS TEST  2000   Unremarkable per pt report   CARPOMETACARPAL JOINT ARTHROTOMY Right 2011   COLONOSCOPY     COLONOSCOPY WITH PROPOFOL N/A 04/21/2015   Procedure: COLONOSCOPY WITH PROPOFOL;  Surgeon: Jeani Hawking, MD;  Location: [facility] END[facility]COPY;  Service: Endoscopy;  Laterality: N/A;   CYSTOSCOPY W/ DILATION OF BLADDER N/A    EPIDURAL BLOCK INJECTION Left 04/12/2016   Left Medial Nerve Block and Left L5 ramus block, Dr Sheran Luz    EPIDURAL BLOCK INJECTION  03/21/2016   Left L3-4 medial branch block and Left L5 & dorsal ramus block    EPIDURAL BLOCK INJECTION N/A 10/25/2016   Sheran Luz, MD. Lumbar medial branch block   EPIDURAL BLOCK INJECTION N/A 02/09/2017   Sheran Luz, MD.  Bilateral L3/4 medial branch block, bilateral L5 dorsal ramus block   EPIDURAL BLOCK INJECTION N/A 07/04/2017   Sheran Luz, MD   EXCISION MORTON'S NEUROMA Right 05/03/2023   Procedure: EXCISION MORTON'S NEUROMA;  Surgeon: Toni Arthurs, MD;  Location: [facility];  Service: Orthopedics;  Laterality: Right;  general, local by surgeon 60 MIN   FECAL TRANSPLANT  04/21/2015   Procedure: FECAL TRANSPLANT;  Surgeon: Jeani Hawking, MD;  Location: [facility] END[facility]COPY;  Service: Endoscopy;;   HIP ARTHROPLASTY Left    HIP ARTHROSCOPY Left 03/06/2018   Left hip arthroplasty, redo for loose hip arthroplasty. Procedure [facility]   INJECTION HIP INTRA ARTICULAR Left 11/2015   for OA by Dr Vilma Prader IMPLANT PLACEMENT  04/2021   Floyde Parkins MD ([facility])   INTERSTIM IMPLANT REVISION N/A 09/2021   Floyde Parkins MD ([facility])   LIGAMENT REPAIR Right 05/03/2023   Procedure: COLLATERAL LIGAMENT REPAIR;  Surgeon: Toni Arthurs, MD;  Location: [facility];  Service: Orthopedics;  Laterality: Right;  general, local by  surgeon 60 MIN   OTHER SURGICAL HISTORY Left 2016   Left L3/L4 medial nerve block and Left L5 Dorsal Ramus block Dr Rosine Abe   TOTAL HIP ARTHROPLASTY Left 07/25/2016   Procedure: LEFT TOTAL HIP ARTHROPLASTY ANTERIOR APPROACH;  Surgeon: Durene Romans, MD;  Location: [facility];  Service: Orthopedics;  Laterality: Left;   WEIL OSTEOTOMY Right 05/03/2023   Procedure: RIGHT 2ND WEIL OSTEOTOMY;  Surgeon: Toni Arthurs, MD;  Location: [facility];  Service: Orthopedics;  Laterality: Right;  general, local by surgeon 60 MIN    Family Psychiatric History:   Family History:  Family History  Problem Relation  Age of Onset   Alzheimer's disease Mother    Hyperlipidemia Mother    Hypertension Mother    Osteoporosis Mother    Parkinson's disease Mother    Migraines Sister    Allergies Sister    Hypertension Sister    Hyperlipidemia Brother    Cardiomyopathy Brother    Diabetes type II Brother    Kidney disease Brother    Asthma Brother    Heart disease Brother    Hypertension Brother    Hyperlipidemia Brother    Kidney disease Brother    Heart disease Brother    Heart disease Father    Hyperlipidemia Father    Hypertension Father    Aortic aneurysm Father    Early death Father 67   Diabetes type II Other    Breast cancer Maternal Aunt     Social History:   Social History   Socioeconomic History   Marital status: Married    Spouse name: Annette Stable Mages   Number of children: 1   Years of education: 16   Highest education level: Bachelor's degree (e.g., BA, AB, BS)  Occupational History   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   Drug use: No   Sexual activity: Yes    Partners: Male    Birth control/protection: None  Other Topics Concern   Not on file  Social History Narrative   Prior PCP With [facility] at [place], [place].   Married, lives with [place], (b. 1954)   Son in [place]. (2) grandchildren.   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    Wears seatbelt usually   No religious beliefs affecting healthcare   No difficulty taking medications as directed.       Home has working smoke alarm   No home throw rugs   Does not have nonslip bathtub / shower    Has railings on all stairs   Home is free from Clutter.    Right-handed.      Best number to reach patient [phone] (M) as of 09/21/2021.    No regular exercise of 3 times a week for 30 minutes at a time      Aireonna Bauer has Scientist, water quality and a Sports administrator is husband, Ranell Finelli [phone]      Caffeine: 2-3 cups coffee per day   Social Drivers of Corporate investment banker Strain: Low Risk  (03/16/2023)   Overall Financial Resource Strain (CARDIA)    Difficulty of Paying Living Expenses: Not hard at all  Food Insecurity: No Food Insecurity (03/16/2023)   Hunger Vital Sign    Worried About Running Out of Food in the Last Year: Never true    Ran Out of Food in the Last Year: Never true  Transportation Needs: No Transportation Needs (03/16/2023)   PRAPARE - Administrator, Civil Service (Medical): No    Lack of Transportation (Non-Medical): No  Physical Activity: Unknown (03/16/2023)   Exercise Vital Sign    Days of Exercise per Week: 0 days    Minutes of Exercise per Session: Not on file  Recent Concern: Physical Activity - Inactive (03/16/2023)   Exercise Vital Sign    Days of Exercise per Week: 0 days    Minutes of Exercise per Session: 0 min  Stress: No Stress Concern Present  (03/16/2023)   [employer] of Occupational Health - Occupational Stress Questionnaire    Feeling of Stress : Only a little  Social Connections: Moderately Integrated (03/16/2023)   Social Connection and Isolation Panel [NHANES]    Frequency of Communication with Friends and Family: Once a week    Frequency of Social Gatherings with Friends and Family: Twice a week    Attends Religious Services: Never    Database administrator or Organizations: Yes    Attends Engineer, structural: More than 4 times per year    Marital Status: Married    Additional Social History:   Allergies:   Allergies  Allergen Reactions   Metformin Hives and Rash   Sulfa Antibiotics Itching   Chlorhexidine Gluconate Rash and Other (See Comments)    Burning "only when used in private area, otherwise OK to use"   Codeine Nausea Only    "head nausea"    Ketoprofen Other (See Comments)   Levofloxacin Other (See Comments)    Pain in arm and behind ankles. Pain in arm and behind ankles.    Lyrica [Pregabalin] Other (See Comments)    Dizziness, nausea   Methylprednisolone     Flushing of the face   Niacin Other (See Comments)    Flushing and tingling.    Prednisone     Flushing of the face   Betadine [Povidone Iodine] Rash    burning   Latex Itching and Rash    burning   Wellbutrin [Bupropion] Anxiety    Metabolic Disorder Labs: Lab Results  Component Value Date   HGBA1C 5.2 09/10/2023   No results found for: "PROLACTIN" Lab Results  Component Value Date   CHOL 166 09/10/2023   TRIG 95 09/10/2023   HDL 51 09/10/2023   CHOLHDL 3.3 10/11/2021   VLDL 26 12/14/2014   LDLCALC 97 09/10/2023   LDLCALC 101 (H) 12/11/2022   Lab Results  Component Value Date   TSH 2.290 10/18/2023    Therapeutic Level Labs: No results found for: "LITHIUM" No results found for: "CBMZ" No results found for: "VALPROATE"  Current Medications: Current Outpatient Medications  Medication Sig Dispense  Refill   LORazepam (ATIVAN) 0.5 MG tablet 1 at bedtime PRN 20 tablet 1   Ascorbic Acid (VITAMIN C PO) Take by mouth.     citalopram (CELEXA) 20 MG tablet TAKE 1 TABLET(20 MG) BY MOUTH DAILY 90 tablet 3   levothyroxine (SYNTHROID) 125 MCG tablet Take 0.5 tablets (62.5 mcg total) by mouth daily before breakfast. 45 tablet 3   meloxicam (MOBIC) 15 MG tablet Take by mouth as needed.     methenamine (HIPREX) 1 g tablet Take 1 g by mouth 2 (two) times daily with a meal.     methocarbamol (ROBAXIN) 500 MG tablet Take 500 mg by mouth as needed.      minoxidil (LONITEN) 2.5 MG tablet Take by mouth daily.     nystatin-triamcinolone (MYCOLOG II) cream APPLY TOPICALLY TO THE AFFECTED AREA TWICE DAILY 30 g 0   rizatriptan (MAXALT) 10 MG tablet Take 1 tablet (10 mg  total) by mouth as needed for migraine. Take 1 tablet at onset of migraine. May repeat in 2 hours if needed. (Do not exceed more than 2 tab in 24 hours) 10 tablet 11   rosuvastatin (CRESTOR) 10 MG tablet TAKE 1 TABLET(10 MG) BY MOUTH DAILY 90 tablet 3   SYSTANE BALANCE 0.6 % SOLN      tirzepatide (ZEPBOUND) 2.5 MG/0.5ML injection vial Inject 2.5 mg into the skin once a week. 2 mL 0   traMADol (ULTRAM) 50 MG tablet Take 1 tablet by mouth 2 (two) times daily as needed.     zolpidem (AMBIEN) 5 MG tablet Take 1 tablet (5 mg total) by mouth at bedtime as needed for sleep. 30 tablet 5   No current facility-administered medications for this visit.    Musculoskeletal: Strength & Muscle Tone: within normal limits Gait & Station: normal Patient leans: Right  Psychiatric Specialty Exam: Review of Systems  Blood pressure (!) 117/93, pulse 88, weight 161 lb (73 kg).Body mass index is 25.99 kg/m.  General Appearance: Fairly Groomed  Eye Contact:  Good  Speech:  Clear and Coherent  Volume:  Normal  Mood:  Anxious  Affect:  Appropriate  Thought Process:  Coherent  Orientation:  Full (Time, Place, and Person)  Thought Content:  WDL  Suicidal  Thoughts:  No  Homicidal Thoughts:  No  Memory:  NA  Judgement:  Good  Insight:  Good  Psychomotor Activity:  Normal  Concentration:  Concentration: Good  Recall:  Good  Fund of Knowledge:Good  Language: Good  Akathisia:  No  Handed:  Right  AIMS (if indicated):  not done  Assets:  Desire for Improvement  ADL's:  Intact  Cognition: WNL  Sleep:  Good   Screenings: PHQ2-9    Flowsheet Row Office Visit from 05/17/2023 in [facility] [place]. [facility] Clinical Support from 12/07/2022 in [facility] - [facility] Dept Of [facility] Bridegroom[facility] Office Visit from 04/10/2022 in [facility] Of [facility] Brideg[facility]. [facility] Office Visit from 02/02/2022 in [facility] - [facility] Of Eligha Bridegroom. [facility] Office Visit from 10/11/2021 in [facility] Of [place]. [facility]  PHQ-2 Total Score 0 0 1 2 2   PHQ-9 Total Score 7 -- 10 9 10       Flowsheet Row Admission (Discharged) from 05/03/2023 in [facility]  C-SSRS RISK CATEGORY No Risk       Assessment and Plan:     This patient's diagnosis is adjustment disorder with a depressed mood state.  She also has insomnia probably primary in nature.  For now she is on a CPAP machine and I give her 5 mg of Ambien.  Today we will add 0.5 mg Ativan to be taking on a rare as needed basis.  The patient return to see me in 5 months.  She is really doing well..  Patient/Guardian was advised Release of Information must be obtained prior to any record release in order to collaborate their care with an outside provider. Patient/Guardian was advised if they have not already done so to contact the registration department to sign all necessary forms in order for [name] to release information regarding their care.   Consent: Patient/Guardian gives verbal consent for treatment and assignment of benefits for services provided during this  visit. Patient/Guardian expressed understanding  and agreed to proceed.   Gypsy Balsam, MD 4/9/20252:11 PM

## 2023-12-25 ENCOUNTER — Other Ambulatory Visit (HOSPITAL_COMMUNITY): Payer: Self-pay

## 2023-12-25 ENCOUNTER — Ambulatory Visit: Admitting: Rehabilitative and Restorative Service Providers"

## 2023-12-25 NOTE — Progress Notes (Signed)
 Called to start PA on Lorazepam 0.5 mg. They will call back with their decision

## 2023-12-27 ENCOUNTER — Ambulatory Visit (INDEPENDENT_AMBULATORY_CARE_PROVIDER_SITE_OTHER): Admitting: Family Medicine

## 2024-01-01 ENCOUNTER — Encounter (INDEPENDENT_AMBULATORY_CARE_PROVIDER_SITE_OTHER): Payer: Self-pay | Admitting: Family Medicine

## 2024-01-01 ENCOUNTER — Telehealth (INDEPENDENT_AMBULATORY_CARE_PROVIDER_SITE_OTHER): Admitting: Family Medicine

## 2024-01-01 VITALS — Ht 66.0 in | Wt 158.0 lb

## 2024-01-01 DIAGNOSIS — Z6825 Body mass index (BMI) 25.0-25.9, adult: Secondary | ICD-10-CM

## 2024-01-01 DIAGNOSIS — R632 Polyphagia: Secondary | ICD-10-CM | POA: Diagnosis not present

## 2024-01-01 DIAGNOSIS — E559 Vitamin D deficiency, unspecified: Secondary | ICD-10-CM | POA: Diagnosis not present

## 2024-01-01 DIAGNOSIS — E669 Obesity, unspecified: Secondary | ICD-10-CM | POA: Diagnosis not present

## 2024-01-01 MED ORDER — ZEPBOUND 2.5 MG/0.5ML ~~LOC~~ SOLN: 2.5000 mg | SUBCUTANEOUS | 0 refills | Status: DC

## 2024-01-02 NOTE — Progress Notes (Signed)
 Office: [phone]  /  Fax: [phone]  WEIGHT SUMMARY AND BIOMETRICS  Anthropometric Measurements Height: 5\' 6"  (1.676 m) Weight: 158 lb (71.7 kg) BMI (Calculated): 25.51   No data recorded Other Clinical Data Fasting: No Labs: No Today's Visit #: 18 Starting Date: 12/11/22 Comments: Virtual Visit  Virtual Visit via Telephone Note  I connected with Molly Giles on 01/02/24 at  4:20 PM EDT by audiovisual telehealth and verified that I am speaking with the correct person using two identifiers.  Location: Patient: home Provider: home   I discussed the limitations, risks, security and privacy concerns of performing an evaluation and management service by AV telehealth and the availability of in person appointments. I also discussed with the patient that there may be a patient responsible charge related to this service. The patient expressed understanding and agreed to proceed.     Chief Complaint: OBESITY   History of Present Illness Molly Giles is a 68 year old female who presents for a follow-up on her obesity treatment plan.  She follows her category one eating plan 75% of the time and has experienced slight weight loss since her last visit, although her weight has been mostly stable over the past two months. She is currently at a weight she considers healthy.  She has a diagnosis of polyphagia and is currently taking Zepbound  2.5 mg every other week, which has helped her maintain her weight.  She has a history of vitamin D  deficiency, which was previously treated with prescription vitamin D . Her levels had increased rapidly, resulting in over-replacement with a level of 103. She has since stopped taking vitamin D  supplements and plans to have her levels rechecked in three months.      PHYSICAL EXAM:  Height 5\' 6"  (1.676 m), weight 158 lb (71.7 kg). Body mass index is 25.5 kg/m.  DIAGNOSTIC DATA REVIEWED:  BMET    Component Value Date/Time    NA 144 09/10/2023 0731   K 4.2 09/10/2023 0731   CL 107 (H) 09/10/2023 0731   CO2 22 09/10/2023 0731   GLUCOSE 79 09/10/2023 0731   GLUCOSE 93 07/05/2019 1629   BUN 20 09/10/2023 0731   CREATININE 0.71 09/10/2023 0731   CREATININE 0.74 02/24/2016 1158   CALCIUM  9.3 09/10/2023 0731   GFRNONAA 65 07/24/2019 1116   GFRNONAA 88 02/24/2016 1158   GFRAA 75 07/24/2019 1116   GFRAA >89 02/24/2016 1158   Lab Results  Component Value Date   HGBA1C 5.2 09/10/2023   HGBA1C 5.3 03/22/2017   Lab Results  Component Value Date   INSULIN  8.0 09/10/2023   INSULIN  10.5 03/22/2017   Lab Results  Component Value Date   TSH 2.290 10/18/2023   CBC    Component Value Date/Time   WBC 6.4 08/04/2020 0000   WBC 6.6 07/05/2019 1629   RBC 4.26 08/04/2020 0000   HGB 13.5 08/04/2020 0000   HGB 12.6 05/07/2018 1057   HCT 41 08/04/2020 0000   HCT 38.8 05/07/2018 1057   PLT 262 08/04/2020 0000   MCV 95.0 07/05/2019 1629   MCV 90 05/07/2018 1057   MCH 30.0 07/05/2019 1629   MCHC 31.6 07/05/2019 1629   RDW 13.8 07/05/2019 1629   RDW 14.0 05/07/2018 1057   Iron  Studies No results found for: "IRON ", "TIBC", "FERRITIN", "IRONPCTSAT" Lipid Panel     Component Value Date/Time   CHOL 166 09/10/2023 0731   TRIG 95 09/10/2023 0731   HDL 51 09/10/2023 0731   CHOLHDL 3.3 10/11/2021  1142   CHOLHDL 3.9 12/14/2014 1036   VLDL 26 12/14/2014 1036   LDLCALC 97 09/10/2023 0731   Hepatic Function Panel     Component Value Date/Time   PROT 6.6 09/10/2023 0731   ALBUMIN 4.3 09/10/2023 0731   AST 21 09/10/2023 0731   ALT 21 09/10/2023 0731   ALKPHOS 84 09/10/2023 0731   BILITOT 0.3 09/10/2023 0731   BILIDIR 0.14 02/12/2020 1056      Component Value Date/Time   TSH 2.290 10/18/2023 1212   Nutritional Lab Results  Component Value Date   VD25OH 103.0 (H) 09/10/2023   VD25OH 41.2 12/11/2022   VD25OH 43.3 05/07/2018     Assessment and Plan Assessment & Plan Obesity and Polyphagia Polyphagia  is well-managed with Zepbound  2.5 mg every other week, maintaining weight. Consideration was given to increasing the dose for further weight loss, but the decision was made to maintain the current dose. - Continue Zepbound  2.5 mg every other week. - Refill Zepbound  prescription and send to [facility]. - Continue Category 1 eating plan  Vitamin D  deficiency Vitamin D  deficiency with previous over-replacement. Current levels are below goal but rapidly increasing. Vitamin D  supplementation remains on hold as previously instructed. - Hold vitamin D  supplementation. - Check vitamin D  levels in 3 months.      She was informed of the importance of frequent follow up visits to maximize her success with intensive lifestyle modifications for her multiple health conditions.    Jasmine Mesi, MD

## 2024-01-07 ENCOUNTER — Encounter (INDEPENDENT_AMBULATORY_CARE_PROVIDER_SITE_OTHER): Payer: Self-pay

## 2024-01-07 ENCOUNTER — Telehealth: Payer: Self-pay

## 2024-01-07 DIAGNOSIS — Z719 Counseling, unspecified: Secondary | ICD-10-CM

## 2024-01-07 NOTE — Telephone Encounter (Signed)
 pt only did 100.00 on facial but was charged 125.00, but Pt bought [employer] Elta MD we took the 25.00 off of that to make it 20.00. Pt next visit will be full price.

## 2024-01-10 HISTORY — PX: HIP ADDUCTOR TENOTOMY: SHX1747

## 2024-01-14 ENCOUNTER — Ambulatory Visit: Payer: Medicare Other

## 2024-01-14 ENCOUNTER — Encounter (INDEPENDENT_AMBULATORY_CARE_PROVIDER_SITE_OTHER): Payer: Self-pay

## 2024-01-14 VITALS — Ht 66.0 in | Wt 158.0 lb

## 2024-01-14 DIAGNOSIS — Z0000 Encounter for general adult medical examination without abnormal findings: Secondary | ICD-10-CM | POA: Diagnosis not present

## 2024-01-14 DIAGNOSIS — Z719 Counseling, unspecified: Secondary | ICD-10-CM

## 2024-01-14 NOTE — Progress Notes (Signed)
 Because this visit was a virtual/telehealth visit,  certain criteria was not obtained, such a blood pressure, CBG if applicable, and timed get up and go. Any medications not marked as "taking" were not mentioned during the medication reconciliation part of the visit. Any vitals not documented were not able to be obtained due to this being a telehealth visit or patient was unable to self-report a recent blood pressure reading due to a lack of equipment at home via telehealth. Vitals that have been documented are verbally provided by the patient.   Subjective:   Molly Giles is a 68 y.o. who presents for a Medicare Wellness preventive visit.  Visit Complete: Virtual I connected with  Laurian Pop Bal on 01/14/24 by a audio enabled telemedicine application and verified that I am speaking with the correct person using two identifiers.  Patient Location: Home  Provider Location: Office/Clinic  I discussed the limitations of evaluation and management by telemedicine. The patient expressed understanding and agreed to proceed.  Vital Signs: Because this visit was a virtual/telehealth visit, some criteria may be missing or patient reported. Any vitals not documented were not able to be obtained and vitals that have been documented are patient reported.  VideoDeclined- This patient declined Librarian, academic. Therefore the visit was completed with audio only.  Persons Participating in Visit: Patient.  AWV Questionnaire: Yes: Patient Medicare AWV questionnaire was completed by the patient on 01/10/2024; I have confirmed that all information answered by patient is correct and no changes since this date.  Cardiac Risk Factors include: dyslipidemia;advanced age (>84men, >67 women);family history of premature cardiovascular disease;sedentary lifestyle     Objective:    Today's Vitals   01/14/24 1112 01/14/24 1113  Weight: 158 lb (71.7 kg)   Height: 5\' 6"  (1.676 m)    PainSc: 4  4   PainLoc: Foot    Body mass index is 25.5 kg/m.     01/14/2024   11:22 AM 10/30/2023    1:59 PM 05/03/2023    6:23 AM 04/27/2023   10:26 AM 04/24/2023    5:09 PM 03/19/2023   11:01 AM 01/01/2023    4:27 PM  Advanced Directives  Does Patient Have a Medical Advance Directive? No Yes Yes Yes Yes No Yes  Type of Special educational needs teacher of [place];Living will  Healthcare Power of [place];Living will Healthcare Power of [place];Living will  Healthcare Power of [place];Living will  Does patient want to make changes to medical advance directive?   No - Patient declined    No - Patient declined  Copy of Healthcare Power of Attorney in Chart?       Yes - validated most recent copy scanned in chart (See row information)  Would patient like information on creating a medical advance directive? No - Patient declined  No - Patient declined    No - Patient declined    Current Medications (verified) Outpatient Encounter Medications as of 01/14/2024  Medication Sig   Ascorbic Acid (VITAMIN C PO) Take by mouth.   citalopram  (CELEXA ) 20 MG tablet TAKE 1 TABLET(20 MG) BY MOUTH DAILY   levothyroxine  (SYNTHROID ) 125 MCG tablet Take 0.5 tablets (62.5 mcg total) by mouth daily before breakfast.   LORazepam  (ATIVAN ) 0.5 MG tablet 1 at bedtime PRN   meloxicam (MOBIC) 15 MG tablet Take by mouth as needed.   methenamine (HIPREX) 1 g tablet Take 1 g by mouth 2 (two) times daily with a meal.   methocarbamol  (ROBAXIN )  500 MG tablet Take 500 mg by mouth as needed.    minoxidil (LONITEN) 2.5 MG tablet Take by mouth daily.   rizatriptan  (MAXALT ) 10 MG tablet Take 1 tablet (10 mg total) by mouth as needed for migraine. Take 1 tablet at onset of migraine. May repeat in 2 hours if needed. (Do not exceed more than 2 tab in 24 hours)   rosuvastatin  (CRESTOR ) 10 MG tablet TAKE 1 TABLET(10 MG) BY MOUTH DAILY   SYSTANE BALANCE 0.6 % SOLN    tirzepatide  (ZEPBOUND ) 2.5 MG/0.5ML injection vial Inject 2.5  mg into the skin once a week.   traMADol  (ULTRAM ) 50 MG tablet Take 1 tablet by mouth 2 (two) times daily as needed.   zolpidem  (AMBIEN ) 5 MG tablet Take 1 tablet (5 mg total) by mouth at bedtime as needed for sleep.   No facility-administered encounter medications on file as of 01/14/2024.    Allergies (verified) Metformin , Sulfa antibiotics, Chlorhexidine gluconate, Codeine, Ketoprofen, Levofloxacin, Lyrica [pregabalin], Methylprednisolone , Niacin, Prednisone , Betadine [povidone iodine], Latex, and Wellbutrin [bupropion]   History: Past Medical History:  Diagnosis Date   Abdominal bloating 05/10/2021   Abdominal discomfort, generalized 05/07/2016   Acute gastritis 11/19/2020   Allergic rhinoconjunctivitis 07/02/2015   Anal fissure 11/19/2020   Anterior to posterior tear of superior glenoid labrum of left shoulder 02/22/2021   Arthritis    Benign positional vertigo 04/2015   Responded well to [facility] Re[facility]   Bruxism (teeth grinding)    Chronic contact dermatitis 08/06/2018   Per allergist Dr Magdaline Schools.    Chronic migraine 02/25/2016   Per Dr Tresia Fruit review in [facility] 2015. Showed total headache days last month 18. Severe headache days 7 days. Moderate headache days 5 days. Mild headache days last month sick days. Days without headache last month 10 days. Symptoms associated with photophobia, phonophobia, osmophobia, neck pain, dizziness, jaw pain, nasal congestion, vision disturbances, tingling and numbness, weakness and worsening with activity. Each headache attack last 3 hours depending on treatment in severity. Left side, the right side, easier side, the frontal area in the back of the head. Characterized as throbbing, pressure, tightness, squeezing, stabbing and burning    Chronic migraine without aura 05/15/2013    Dr Tresia Fruit [facility]   Chronic tonsillitis 01/27/2019   DDD (degenerative disc disease), cervical  11/18/2018   DDD (degenerative disc disease), lumbar    Depression    Disc displacement, lumbar    Dysphagia 10/11/2021   Dysrhythmia    seen by dr Anastasia Balo- not a problem since she has been on Bystolic    Episodic cluster headache, not intractable 03/06/2017   Essential hypertension 05/15/2013   Family history of adverse reaction to anesthesia    Brother- N/V   Family history of premature CAD 05/15/2013   Fibromyalgia syndrome 07/01/2015   Management by Dr Laneta Pintos. Devashwar (Rheum)    GERD (gastroesophageal reflux disease) 12/16/2014   H/O seasonal allergies    Hallux rigidus of right foot 09/25/2022   Hammer toe    Left great toe   Hashimoto's thyroiditis    Per patient, diagnosed by Dr. Arthur Billing   Hearing loss of both ears 07/27/2015   mild to borderline moderate low frequency hearing loss improving to within normal limits bilaterally on audiology testing at [facility] in November 2016.     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   Hyperlipidemia 1998   Hypokalemia due to excessive gastrointestinal loss of potassium 07/28/2019   Hypothyroidism    Iliopsoas bursitis of left hip 05/08/2022   Impairment of balance 02/2015   Consequent of postconcussive syndrome   Injury of triangular fibrocartilage complex of left wrist 02/25/2019   Dx 02/25/19 Arvil Birks IV MD ([facility])   Insulin  resistance 07/04/2017   Internal hemorrhoid 01/28/2021   Internal hemorrhoid seen on colonoscopy 10/2020 Selwyn Dalton MD [facility])   Interstitial cystitis    Irritable bowel syndrome with diarrhea 04/03/2016   Left ventricular hypertrophy, mild 02/25/2016   ECHOcardiogram report 06/17/15  showing EF55-60%, mild LVH and G1DD    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HCC) 03/10/2018   [facility]   Lumbar facet joint pain    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   Mood disorder (HCC)    Morbid obesity (HCC) 03/06/2017   Musculoskeletal neck pain 07/14/2015   Nocturnal hypoxemia 03/06/2017   Normal coronary arteries 05/14/2014   Obesity (BMI 30.0-34.9) 01/29/2017   Odynophagia 12/20/2021   Osteoarthritis of left hip 11/01/2015   MRI order by Dr Bernard Brick (ortho) 10/2015 showed significant arthritis of left hip joint with cystic changes in femoral head c/w osteoarthritis   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, lumbar region 10/30/2011   Other insomnia 11/09/2016   Overweight    Pain in joint of left shoulder 11/09/2016   Pain in joint, multiple sites 11/18/2018   Palpitations 05/15/2013   [facility] manages   Perianal candidiasis 12/20/2021   Periodic limb movement sleep disorder 03/28/2017   Perirectal cyst 05/07/2016   Poison ivy dermatitis 03/24/2021   PONV (postoperative nausea and vomiting)    Positive ANA (antinuclear antibody) 11/18/2018   Post concussion syndrome 06/06/2015   Post concussive syndrome 07/14/2015   Ms Ohlsen's post-concussive syndrome manifesting in vertigo and headache, mood changes, poor balance, dizziness, and decreased concentration per Dr Tresia Fruit at [facility].    Posterior vitreous detachment of right eye 2014   Premature menopause 12/20/2021   Rectal abscess 05/10/2021   S/P left THA, AA 07/25/2016   Sacroiliac inflammation (HCC) 08/18/2021   Shingles    Shortness of breath dyspnea    with exertion   Sicca syndrome (HCC) 11/18/2018   (+) ANA   Sleep walking and eating 03/06/2017   Snoring 03/06/2017   Spondylosis of lumbar region without myelopathy or radiculopathy 10/30/2011   TFC (triangular fibrocartilage complex) injury 02/25/2019   Thyroid  nodule 08/11/2009   Findings: The thyroid  gland is within  normal limits in size.  The gland is diffusely inhomogeneous. A small solid nodule is noted in the lower pole  medially on the right of 7 x 6 x 8 mm. A small solid nodule is noted inferiorly on the left of 3 x 3 x 4 mm.  IMPRESSION:  The thyroid  gland is within normal limits in size with only small solid nodules present, the largest of only 8 mm in diameter on the right.     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    Trochanteric bursitis, right hip 04/26/2020   Tubular adenoma of colon 01/28/2021   Colonoscopy screening 4 mm tubular Adenoma polyp Baldo Bonds, MD [facility])  Tubular adenoma of colon 01/28/2021   Colonoscopy screening 4 mm tubular Adenoma polyp Baldo Bonds, MD [facility])   Vasomotor symptoms due to menopause 04/19/2017   Vitamin D  deficiency 05/08/2017   Vulvitis 07/22/2020   Yeast vaginitis 07/11/2019   Past Surgical History:  Procedure Laterality Date   arthroscopy Left 01/2022   with SAD and DCR, K. Supple MD   Bladder dilitation     x 3   BREAST BIOPSY Right 2011   Benign histology   CARDIOVASCULAR STRESS TEST  2000   Unremarkable per pt report   CARPOMETACARPAL JOINT ARTHROTOMY Right 2011   COLONOSCOPY     COLONOSCOPY WITH PROPOFOL  N/A 04/21/2015   Procedure: COLONOSCOPY WITH PROPOFOL ;  Surgeon: Alvis Jourdain, MD;  Location: [facility] END[facility]COPY;  Service: Endoscopy;  Laterality: N/A;   CYSTOSCOPY W/ DILATION OF BLADDER N/A    EPIDURAL BLOCK INJECTION Left 04/12/2016   Left Medial Nerve Block and Left L5 ramus block, Dr Adelaide Adjutant    EPIDURAL BLOCK INJECTION  03/21/2016   Left L3-4 medial branch block and Left L5 & dorsal ramus block    EPIDURAL BLOCK INJECTION N/A 10/25/2016   Adelaide Adjutant, MD. Lumbar medial branch block   EPIDURAL BLOCK INJECTION N/A 02/09/2017   Adelaide Adjutant, MD.  Bilateral L3/4 medial branch block, bilateral L5 dorsal ramus block   EPIDURAL BLOCK INJECTION N/A 07/04/2017    Adelaide Adjutant, MD   EXCISION MORTON'S NEUROMA Right 05/03/2023   Procedure: EXCISION MORTON'S NEUROMA;  Surgeon: Amada Backer, MD;  Location: [facility];  Service: Orthopedics;  Laterality: Right;  general, local by surgeon 60 MIN   FECAL TRANSPLANT  04/21/2015   Procedure: FECAL TRANSPLANT;  Surgeon: Alvis Jourdain, MD;  Location: [facility] ENDOSCOPY;  Service: Endoscopy;;   HIP ARTHROPLASTY Left    HIP ARTHROSCOPY Left 03/06/2018   Left hip arthroplasty, redo for loose hip arthroplasty. Procedure at [facility]   INJECTION HIP INTRA ARTICULAR Left 11/2015   for OA by Dr Dana Duncan IMPLANT PLACEMENT  04/2021   Francena Infield MD ([facility])   INTERSTIM IMPLANT REVISION N/A 09/2021   Francena Infield MD ([facility])   LIGAMENT REPAIR Right 05/03/2023   Procedure: COLLATERAL LIGAMENT REPAIR;  Surgeon: Amada Backer, MD;  Location: [facility];  Service: Orthopedics;  Laterality: Right;  general, local by surgeon 60 MIN   OTHER SURGICAL HISTORY Left 2016   Left L3/L4 medial nerve block and Left L5 Dorsal Ramus block Dr Sandee Crook   TOTAL HIP ARTHROPLASTY Left 07/25/2016   Procedure: LEFT TOTAL HIP ARTHROPLASTY ANTERIOR APPROACH;  Surgeon: Claiborne Crew, MD;  Location: [facility];  Service: Orthopedics;  Laterality: Left;   WEIL OSTEOTOMY Right 05/03/2023   Procedure: RIGHT 2ND WEIL OSTEOTOMY;  Surgeon: Amada Backer, MD;  Location: [facility];  Service: Orthopedics;  Laterality: Right;  general, local by surgeon 60 MIN   Family History  Problem Relation Age of Onset   Alzheimer's disease Mother    Hyperlipidemia Mother    Hypertension Mother    Osteoporosis Mother    Parkinson's disease Mother    Migraines Sister    Allergies Sister    Hypertension Sister    Hyperlipidemia Brother    Cardiomyopathy Brother    Diabetes type II Brother    Kidney disease Brother    Asthma Brother    Heart disease Brother     Hypertension Brother    Hyperlipidemia Brother  Kidney disease Brother    Heart disease Brother    Heart disease Father    Hyperlipidemia Father    Hypertension Father    Aortic aneurysm Father    Early death Father 54   Diabetes type II Other    Breast cancer Maternal Aunt    Social History   Socioeconomic History   Marital status: Married    Spouse name: Camerin Ewan   Number of children: 1   Years of education: 16   Highest education level: Bachelor's degree (e.g., BA, AB, BS)  Occupational History   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   Drug use: No   Sexual activity: Yes    Partners: Male    Birth control/protection: None  Other Topics Concern   Not on file  Social History Narrative   Prior PCP With [facility] [place], [place].   Married, lives with [place], (b. 1954)   Son in [place]. (2) grandchildren.   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    Wears seatbelt usually   No religious beliefs affecting healthcare   No difficulty taking medications as directed.       Home has working smoke alarm   No home throw rugs   Does not have nonslip bathtub / shower    Has railings on all stairs   Home is free from Clutter.    Right-handed.      Best number to reach patient [phone] (M) as of 09/21/2021.    No regular exercise of 3 times a week for 30 minutes at a time      Julio Kaatz has Scientist, water quality and a Sports administrator is husband, Brittiny Nestel [phone]       Caffeine: 2-3 cups coffee per day   Social Drivers of Corporate investment banker Strain: Low Risk  (01/10/2024)   Overall Financial Resource Strain (CARDIA)    Difficulty of Paying Living Expenses: Not hard at all  Food Insecurity: No Food Insecurity (01/10/2024)   Hunger Vital Sign    Worried About Running Out of Food in the Last Year: Never true    Ran Out of Food in the Last Year: Never true  Transportation Needs: No Transportation Needs (01/10/2024)   PRAPARE - Administrator, Civil Service (Medical): No    Lack of Transportation (Non-Medical): No  Physical Activity: Unknown (01/10/2024)   Exercise Vital Sign    Days of Exercise per Week: 0 days    Minutes of Exercise per Session: Not on file  Stress: No Stress Concern Present (01/10/2024)   [employer] of Occupational Health - Occupational Stress Questionnaire    Feeling of Stress : Only a little  Social Connections: Moderately Integrated (01/10/2024)   Social Connection and Isolation Panel [NHANES]    Frequency of Communication with Friends and Family: Once a week    Frequency of Social Gatherings with Friends and Family:  Twice a week    Attends Religious Services: Never    Active Member of Clubs or Organizations: Yes    Attends Engineer, structural: More than 4 times per year    Marital Status: Married    Tobacco Counseling Counseling given: Not Answered    Clinical Intake:  Pre-visit preparation completed: Yes  Pain : No/denies pain Pain Score: 4      BMI - recorded: 25.5 Nutritional Status: BMI 25 -29 Overweight Nutritional Risks: None Diabetes: No  Lab Results  Component Value Date   HGBA1C 5.2 09/10/2023   HGBA1C 5.3 12/11/2022   HGBA1C 5.2 06/26/2019     How often do you need to have someone help you when you read instructions, pamphlets, or other written materials from your doctor or pharmacy?: 1 - Never  Interpreter Needed?: No  Information entered by :: Logen Fowle N. Aysa Larivee,  LPN.   Activities of Daily Living     01/14/2024   11:22 AM 01/10/2024    5:04 PM  In your present state of health, do you have any difficulty performing the following activities:  Hearing? 0 0  Vision? 0 0  Difficulty concentrating or making decisions? 1 1  Walking or climbing stairs? 1 1  Dressing or bathing? 0 0  Doing errands, shopping? 0 0  Preparing Food and eating ? N N  Using the Toilet? N N  In the past six months, have you accidently leaked urine? N N  Do you have problems with loss of bowel control? N N  Managing your Medications? N N  Managing your Finances? N N  Housekeeping or managing your Housekeeping? N N    Patient Care Team: McDiarmid, Demetra Filter, MD as PCP - General (Family Medicine) Ronn Cohn, MD as Consulting Physician (Orthopedic Surgery) Millicent Ally, MD as Consulting Physician (Cardiology) Adelaide Adjutant, MD as Consulting Physician (Physical Medicine and Rehabilitation) Alvis Jourdain, MD as Consulting Physician (Gastroenterology) Viviano Ground, MD as Consulting Physician (Urology) Glory Larsen, MD as Consulting Physician (Neurology) Liane Redman, MD as Consulting Physician (Infectious Diseases) Nicholas Bari, MD as Consulting Physician (Rheumatology) Doria Garden, PT as Physical Therapist (Physical Therapy) Belle Box, MD as Consulting Physician (Obstetrics and Gynecology) Deena Farrier, MD as Consulting Physician (Otolaryngology) [facility] as Consulting Physician Claiborne Crew, MD as Consulting Physician (Orthopedic Surgery) Magdaline Schools as Consulting Physician (Allergy and Immunology) Alferd Angers (Inactive) as Consulting Physician (Family Medicine) Andrez Banker, MD as Consulting Physician (Urology) Aminta Kales, MD as Consulting Physician (Ophthalmology)  Indicate any recent Medical Services you may have received from other than [facility] providers in the past year (date may be  approximate).     Assessment:   This is a routine wellness examination for [place].  Hearing/Vision screen Hearing Screening - Comments:: Patient denied any hearing difficulty.   No hearing aids.  Vision Screening - Comments:: Patient does wear corrective lenses.  Annual eye exam done by: Dr. Geraldina Klinefelter at [facility]    Goals Addressed               This Visit's Progress     Blood Pressure < 140/90        My goal is to start an exercise regimen and do some strength training. (pt-stated)         Depression Screen     01/14/2024   11:14 AM 05/17/2023   11:03 AM 03/19/2023   11:49 AM 12/07/2022  2:14 PM 04/10/2022    9:58 AM 02/02/2022    9:47 AM 10/11/2021   10:18 AM  PHQ 2/9 Scores  PHQ - 2 Score 0 0  0 1 2 2   PHQ- 9 Score 0 7   10 9 10   Exception Documentation   Patient refusal        Fall Risk     01/14/2024   11:22 AM 01/10/2024    5:04 PM 05/17/2023   11:04 AM 03/19/2023   11:01 AM 12/07/2022    2:15 PM  Fall Risk   Falls in the past year? 0 0 0 0 0  Number falls in past yr: 0 0 0 0 0  Injury with Fall? 0  0 0 0  Risk for fall due to : No Fall Risks    No Fall Risks  Follow up Falls prevention discussed;Falls evaluation completed    Falls evaluation completed    MEDICARE RISK AT HOME:  Medicare Risk at Home Any stairs in or around the home?: Yes If so, are there any without handrails?: No Home free of loose throw rugs in walkways, pet beds, electrical cords, etc?: No Adequate lighting in your home to reduce risk of falls?: Yes Life alert?: No Use of a cane, walker or w/c?: No Grab bars in the bathroom?: No Shower chair or bench in shower?: No Elevated toilet seat or a handicapped toilet?: No  TIMED UP AND GO:  Was the test performed?  No  Cognitive Function: 6CIT completed    01/14/2024   11:14 AM 02/25/2016   12:53 PM  MMSE - Mini Mental State Exam  Not completed: Unable to complete   Orientation to time  --  Orientation to time comments  Testing:  Ms Banko named 41 different animal types in 50 seconds WNL        01/14/2024   11:20 AM 09/21/2021    3:21 PM  6CIT Screen  What Year? 0 points 0 points  What month? 0 points 0 points  What time? 0 points 0 points  Count back from 20 0 points 0 points  Months in reverse 0 points 0 points  Repeat phrase 0 points 0 points  Total Score 0 points 0 points    Immunizations Immunization History  Administered Date(s) Administered   Fluad Quad(high Dose 65+) 07/22/2020   Fluad Trivalent(High Dose 65+) 05/17/2023   Influenza Inj Mdck Quad Pf 08/12/2018   Influenza,inj,Quad PF,6+ Mos 07/01/2015, 05/10/2017, 06/26/2019   Influenza-Unspecified 09/13/2016, 08/12/2018, 06/11/2021   Moderna Sars-Covid-2 Vaccination 11/19/2019, 12/18/2019, 08/06/2020   PNEUMOCOCCAL CONJUGATE-20 10/11/2021   Pfizer Covid-19 Vaccine Bivalent Booster 5yrs & up 08/23/2021   Tdap 11/09/2011, 02/24/2016, 09/13/2016   Zoster Recombinant(Shingrix) 08/12/2018, 12/15/2021    Screening Tests Health Maintenance  Topic Date Due   COVID-19 Vaccine (5 - 2024-25 season) 05/13/2023   INFLUENZA VACCINE  04/11/2024   DEXA SCAN  05/12/2024   Medicare Annual Wellness (AWV)  01/13/2025   MAMMOGRAM  06/04/2025   Colonoscopy  01/28/2026   DTaP/Tdap/Td (4 - Td or Tdap) 09/13/2026   Pneumonia Vaccine 55+ Years old  Completed   Hepatitis C Screening  Completed   Zoster Vaccines- Shingrix  Completed   HPV VACCINES  Aged Out   Meningococcal B Vaccine  Aged Out    Health Maintenance  Health Maintenance Due  Topic Date Due   COVID-19 Vaccine (5 - 2024-25 season) 05/13/2023   Health Maintenance Items Addressed: Yes Patient is due for Covid-19 vaccine.  Additional Screening:  Vision Screening: Recommended annual ophthalmology exams for early detection of glaucoma and other disorders of the eye.  Dental Screening: Recommended annual dental exams for proper oral hygiene  Community Resource Referral / Chronic Care  Management: CRR required this visit?  No   CCM required this visit?  No     Plan:     I have personally reviewed and noted the following in the patient's chart:   Medical and social history Use of alcohol , tobacco or illicit drugs  Current medications and supplements including opioid prescriptions. Patient is not currently taking opioid prescriptions. Functional ability and status Nutritional status Physical activity Advanced directives List of other physicians Hospitalizations, surgeries, and ER visits in previous 12 months Vitals Screenings to include cognitive, depression, and falls Referrals and appointments  In addition, I have reviewed and discussed with patient certain preventive protocols, quality metrics, and best practice recommendations. A written personalized care plan for preventive services as well as general preventive health recommendations were provided to patient.     Margette Sheldon, LPN   4/0/1027   After Visit Summary: (MyChart) Due to this being a telephonic visit, the after visit summary with patients personalized plan was offered to patient via MyChart   Notes: Nothing significant to report at this time.

## 2024-01-14 NOTE — Patient Instructions (Signed)
 [name] , Thank you for taking time to come for your Medicare Wellness Visit. I appreciate your ongoing commitment to your health goals. Please review the following plan we discussed and let me know if I can assist you in the future.   Referrals/Orders/Follow-Ups/Clinician Recommendations: Yes, continue to maintain your health by staying independent and start that exercise regimen and strength training for your 2025 healthcare goal.  You got this!!!!!!  This is a list of the screening recommended for you and due dates:  Health Maintenance  Topic Date Due   COVID-19 Vaccine (5 - 2024-25 season) 05/13/2023   Flu Shot  04/11/2024   DEXA scan (bone density measurement)  05/12/2024   Medicare Annual Wellness Visit  01/13/2025   Mammogram  06/04/2025   Colon Cancer Screening  01/28/2026   DTaP/Tdap/Td vaccine (4 - Td or Tdap) 09/13/2026   Pneumonia Vaccine  Completed   Hepatitis C Screening  Completed   Zoster (Shingles) Vaccine  Completed   HPV Vaccine  Aged Out   Meningitis B Vaccine  Aged Out    Advanced directives: (Declined) Advance directive discussed with you today. Even though you declined this today, please call our office should you change your mind, and we can give you the proper paperwork for you to fill out.  Next Medicare Annual Wellness Visit scheduled for next year: Yes, 01/19/2025 at 11:30 a.m. via PHONE with Nurse Health Advisor.  Have you seen your provider in the last 6 months (3 months if uncontrolled diabetes)? No, left voice message to contact office at [phone] to schedule an appointment with Dr. Ena Harries McDiarmid.

## 2024-01-17 ENCOUNTER — Other Ambulatory Visit (INDEPENDENT_AMBULATORY_CARE_PROVIDER_SITE_OTHER): Payer: Self-pay | Admitting: Family Medicine

## 2024-01-17 DIAGNOSIS — E669 Obesity, unspecified: Secondary | ICD-10-CM

## 2024-01-18 ENCOUNTER — Other Ambulatory Visit (INDEPENDENT_AMBULATORY_CARE_PROVIDER_SITE_OTHER): Payer: Self-pay | Admitting: Family Medicine

## 2024-01-18 DIAGNOSIS — E669 Obesity, unspecified: Secondary | ICD-10-CM

## 2024-01-23 ENCOUNTER — Encounter: Payer: Self-pay | Admitting: Family Medicine

## 2024-01-23 DIAGNOSIS — Z9889 Other specified postprocedural states: Secondary | ICD-10-CM | POA: Insufficient documentation

## 2024-01-23 HISTORY — DX: Other specified postprocedural states: Z98.890

## 2024-02-02 ENCOUNTER — Other Ambulatory Visit (HOSPITAL_COMMUNITY): Payer: Self-pay | Admitting: Psychiatry

## 2024-02-05 ENCOUNTER — Other Ambulatory Visit: Admitting: Plastic Surgery

## 2024-02-11 ENCOUNTER — Encounter

## 2024-02-13 ENCOUNTER — Encounter: Payer: Self-pay | Admitting: Rehabilitative and Restorative Service Providers"

## 2024-02-19 DIAGNOSIS — G8929 Other chronic pain: Secondary | ICD-10-CM | POA: Insufficient documentation

## 2024-02-19 HISTORY — DX: Other chronic pain: G89.29

## 2024-02-25 ENCOUNTER — Ambulatory Visit: Admitting: Rehabilitative and Restorative Service Providers"

## 2024-02-27 ENCOUNTER — Ambulatory Visit (INDEPENDENT_AMBULATORY_CARE_PROVIDER_SITE_OTHER): Admitting: Family Medicine

## 2024-03-04 ENCOUNTER — Other Ambulatory Visit: Payer: Self-pay | Admitting: Plastic Surgery

## 2024-03-05 ENCOUNTER — Encounter (INDEPENDENT_AMBULATORY_CARE_PROVIDER_SITE_OTHER): Payer: Self-pay | Admitting: Family Medicine

## 2024-03-05 ENCOUNTER — Ambulatory Visit (INDEPENDENT_AMBULATORY_CARE_PROVIDER_SITE_OTHER): Admitting: Family Medicine

## 2024-03-05 VITALS — BP 122/81 | HR 77 | Temp 98.0°F | Ht 66.0 in | Wt 160.0 lb

## 2024-03-05 DIAGNOSIS — R609 Edema, unspecified: Secondary | ICD-10-CM | POA: Diagnosis not present

## 2024-03-05 DIAGNOSIS — Z9889 Other specified postprocedural states: Secondary | ICD-10-CM | POA: Diagnosis not present

## 2024-03-05 DIAGNOSIS — M6258 Muscle wasting and atrophy, not elsewhere classified, other site: Secondary | ICD-10-CM

## 2024-03-05 DIAGNOSIS — Z6825 Body mass index (BMI) 25.0-25.9, adult: Secondary | ICD-10-CM

## 2024-03-05 DIAGNOSIS — E669 Obesity, unspecified: Secondary | ICD-10-CM

## 2024-03-05 MED ORDER — ZEPBOUND 2.5 MG/0.5ML ~~LOC~~ SOLN: 2.5000 mg | SUBCUTANEOUS | 0 refills | Status: DC
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
## 2024-03-07 ENCOUNTER — Other Ambulatory Visit: Payer: Self-pay | Admitting: Plastic Surgery

## 2024-03-10 ENCOUNTER — Encounter

## 2024-03-17 ENCOUNTER — Encounter: Payer: Self-pay | Admitting: Rehabilitative and Restorative Service Providers"

## 2024-03-17 ENCOUNTER — Other Ambulatory Visit (INDEPENDENT_AMBULATORY_CARE_PROVIDER_SITE_OTHER): Payer: Self-pay | Admitting: Family Medicine

## 2024-03-17 ENCOUNTER — Ambulatory Visit: Attending: Physician Assistant | Admitting: Rehabilitative and Restorative Service Providers"

## 2024-03-17 ENCOUNTER — Other Ambulatory Visit: Payer: Self-pay

## 2024-03-17 DIAGNOSIS — R2689 Other abnormalities of gait and mobility: Secondary | ICD-10-CM | POA: Insufficient documentation

## 2024-03-17 DIAGNOSIS — M6281 Muscle weakness (generalized): Secondary | ICD-10-CM | POA: Diagnosis present

## 2024-03-17 DIAGNOSIS — E669 Obesity, unspecified: Secondary | ICD-10-CM

## 2024-03-17 DIAGNOSIS — M25551 Pain in right hip: Secondary | ICD-10-CM | POA: Insufficient documentation

## 2024-03-17 NOTE — Therapy (Signed)
 OUTPATIENT PHYSICAL THERAPY LOWER EXTREMITY EVALUATION   Patient Name: Molly Giles MRN: [id number] DOB:12/19/1955, 68 y.o., female Today's Date: 03/17/2024  END OF SESSION:  PT End of Session - 03/17/24 1153     Visit Number 1    Number of Visits 16    Date for PT Re-Evaluation 04/16/24    Progress Note Due on Visit 10    PT Start Time 1152    PT Stop Time 1235    PT Time Calculation (min) 43 min    Activity Tolerance Patient tolerated treatment well    Behavior During Therapy [facility] for tasks assessed/performed          Past Medical History:  Diagnosis Date   Abdominal bloating 05/10/2021   Abdominal discomfort, generalized 05/07/2016   Acquired hallux rigidus of right foot 09/15/2022   Acute gastritis 11/19/2020   Allergic rhinoconjunctivitis 07/02/2015   Anal fissure 11/19/2020   Anterior to posterior tear of superior glenoid labrum of left shoulder 02/22/2021   Arthritis    Arthritis of carpometacarpal [facility]) joint of left thumb 09/07/2023   Aseptic loosening of prosthetic hip, initial encounter (HCC) 11/29/2017   Benign essential hypertension 10/28/2012   Benign positional vertigo 04/2015   Responded well to [facility]   Bruxism (teeth grinding)    Cervical spondylosis 11/18/2018   Chronic contact dermatitis 08/06/2018   Per allergist Dr Almarie Scala.    Chronic interstitial cystitis 11/09/2011   Managed by [facility] urology branch in [place] hydrodistention 5 years ago with Dr Claudene in [place], has relief of symptoms until now     Chronic low back pain 05/14/2019   Chronic migraine 02/25/2016   Per Dr Ines review in notes from [facility] from September 2015. Showed total headache days last month 18. Severe headache days 7 days. Moderate headache days 5 days. Mild headache days last month sick days. Days without headache last month 10 days. Symptoms associated with photophobia, phonophobia, osmophobia, neck pain, dizziness, jaw pain, nasal  congestion, vision disturbances, tingling and numbness, weakness and worsening with activity. Each headache attack last 3 hours depending on treatment in severity. Left side, the right side, easier side, the frontal area in the back of the head. Characterized as throbbing, pressure, tightness, squeezing, stabbing and burning    Chronic migraine without aura 05/15/2013    Dr Ines [facility]   Chronic tonsillitis 01/27/2019   DDD (degenerative disc disease), cervical 11/18/2018   DDD (degenerative disc disease), lumbar    Depression    Disc displacement, lumbar    Dysphagia 10/11/2021   Dysrhythmia    seen by dr Blanca- not a problem since she has been on Bystolic    Epiretinal membrane (ERM) of left eye 10/17/2021   Periodic ophthalmological monitoring     Episodic cluster headache, not intractable 03/06/2017   Essential hypertension 05/15/2013   Family history of adverse reaction to anesthesia    Brother- N/V   Family history of premature CAD 05/15/2013   Fibromyalgia 10/01/2013   Management by Dr GORMAN. Devashwar (Rheum)     Fibromyalgia syndrome 07/01/2015   Management by Dr GORMAN. Devashwar (Rheum)    GERD (gastroesophageal reflux disease) 12/16/2014   H/O seasonal allergies    Hallux rigidus of right foot 09/25/2022   Hammer toe    Left great toe   Hashimoto's thyroiditis    Per patient, diagnosed by Dr. Sharyne Pacini   Hearing loss of both ears 07/27/2015   mild to borderline moderate low  frequency hearing loss improving to within normal limits bilaterally on audiology testing at [facility] in November 2016.     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   Hyperlipidemia 1998   Hypokalemia due to excessive gastrointestinal loss of potassium 07/28/2019   Hypothyroidism    Iliopsoas bursitis of left hip 05/08/2022   Impairment of balance 02/2015   Consequent of postconcussive syndrome   Injury of triangular fibrocartilage complex (TFCC) 02/25/2019   Injury of triangular fibrocartilage complex of left wrist 02/25/2019   Dx 02/25/19 Prentice Pagan IV MD ([facility])   Insulin  resistance 07/04/2017   Internal hemorrhoid 01/28/2021   Internal hemorrhoid seen on colonoscopy 10/2020 REGINOLD Sol MD [facility])   Interstitial cystitis    Irritable bowel syndrome with diarrhea 04/03/2016   Keratoconjunctivitis sicca 11/18/2018   (+) ANA     Left ventricular hypertrophy, mild 02/25/2016   ECHOcardiogram report 06/17/15 showing EF55-60%, mild LVH and G1DD    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HCC) 03/10/2018   [facility]   Low serum vitamin B12 01/10/2023   Lumbar facet joint pain    Meibomian gland dysfunction (MGD) of both eyes 10/17/2021   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  Added automatically from request for surgery [id number]     Formatting of this note might be different from the original.  Added automatically from request for surgery [id number]     Obesity (BMI 30.0-34.9) 01/29/2017   Odynophagia 12/20/2021   OSA (obstructive sleep apnea) 12/27/2022   Osteoarthritis of acromioclavicular joint 02/22/2021   Osteoarthritis of left hip 11/01/2015   MRI order by Dr Ernie  (ortho) 10/2015 showed significant arthritis of left hip joint with cystic changes in femoral head c/w osteoarthritis   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, lumbar region 10/30/2011   Other insomnia 11/09/2016   Overweight    Pain in joint of left shoulder 11/09/2016   Pain in joint, multiple sites 11/18/2018   Palpitations 05/15/2013   [facility] manages   Perianal candidiasis 12/20/2021   Periodic limb movement sleep disorder 03/28/2017   Perirectal cyst 05/07/2016   Plantar fasciitis of right foot 11/10/2022   Poison ivy dermatitis 03/24/2021   PONV (postoperative nausea and vomiting)    Positive ANA (antinuclear antibody) 11/18/2018   Post concussion syndrome 06/06/2015   Post concussive syndrome 07/14/2015   Ms Rawles's post-concussive syndrome manifesting in vertigo and headache, mood changes, poor balance, dizziness, and decreased concentration per Dr Ines at [facility].    Posterior vitreous detachment of right eye 2014   Premature menopause 12/20/2021   Primary osteoarthritis of left knee 06/20/2018   Psychophysiological insomnia 11/07/2022   Pure hypercholesterolemia 05/15/2013   Rectal abscess 05/10/2021   S/P left  THA, AA 07/25/2016   S/P revision of total hip 04/10/2018   S/P tendon repair 01/23/2024   Sacroiliac inflammation (HCC) 08/18/2021   Severe obstructive sleep apnea-hypopnea syndrome 12/22/2022   Shingles    Shortness of breath dyspnea    with exertion   Sicca syndrome (HCC) 11/18/2018   (+) ANA   Sjogren syndrome (HCC) 01/27/2019   Sjogren's syndrome (HCC) 01/27/2019   Sleep walking and eating 03/06/2017   Snoring 03/06/2017   Spondylosis of lumbar region without myelopathy or radiculopathy 10/30/2011   Status post total hip replacement, left 09/20/2017   TFC (triangular fibrocartilage complex) injury 02/25/2019   Thyroid  nodule 08/11/2009   Findings: The thyroid  gland is within normal limits in size.  The gland  is diffusely inhomogeneous. A small solid nodule is noted in the lower pole  medially on the right of 7 x 6 x 8 mm. A small solid nodule is noted inferiorly on the left of 3 x 3 x 4 mm.  IMPRESSION:  The thyroid  gland is within normal limits in size with only small solid nodules present, the largest of only 8 mm in diameter on the right.     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    Trochanteric bursitis, right hip 04/26/2020   Tubular adenoma of colon 01/28/2021   Colonoscopy screening 4 mm tubular Adenoma polyp Marilu Sol, MD [facility])   Tubular adenoma of colon 01/28/2021   Colonoscopy screening 4 mm tubular Adenoma polyp Marilu Sol, MD [facility])   Vasomotor symptoms due to menopause 04/19/2017   Vertigo 03/19/2023   Vitamin D  deficiency 05/08/2017   Vulvitis 07/22/2020   Yeast vaginitis 07/11/2019   Past Surgical History:  Procedure Laterality Date   arthroscopy Left 01/2022   with SAD and DCR, K. Supple MD   Bladder dilitation     x 3   BREAST BIOPSY Right 2011   Benign histology   CARDIOVASCULAR STRESS TEST  2000   Unremarkable per pt report   CARPOMETACARPAL JOINT ARTHROTOMY Right 2011   COLONOSCOPY     COLONOSCOPY WITH PROPOFOL  N/A 04/21/2015   Procedure: COLONOSCOPY WITH PROPOFOL ;  Surgeon: Belvie Just, MD;  Location: [facility] END[facility]COPY;  Service: Endoscopy;  Laterality: N/A;   CYSTOSCOPY W/ DILATION OF BLADDER N/A    EPIDURAL BLOCK INJECTION Left 04/12/2016   Left Medial Nerve Block and Left L5 ramus block, Dr Charlie Dolores    EPIDURAL BLOCK INJECTION  03/21/2016   Left L3-4 medial branch block and Left L5 & dorsal ramus block    EPIDURAL BLOCK INJECTION N/A 10/25/2016   Charlie Dolores, MD. Lumbar medial branch block   EPIDURAL BLOCK INJECTION N/A 02/09/2017   Charlie Dolores, MD.  Bilateral L3/4 medial branch block, bilateral L5 dorsal ramus block   EPIDURAL BLOCK INJECTION N/A 07/04/2017   Charlie Dolores, MD   EXCISION MORTON'S NEUROMA Right 05/03/2023   Procedure: EXCISION MORTON'S NEUROMA;  Surgeon: Kit Rush, MD;  Location: [facility];  Service: Orthopedics;  Laterality: Right;  general, local by surgeon 60 MIN   FECAL TRANSPLANT  04/21/2015   Procedure: FECAL TRANSPLANT;  Surgeon: Belvie Just, MD;  Location: [facility] ENDOSCOPY;  Service: Endoscopy;;   HIP ARTHROPLASTY Left    HIP ARTHROSCOPY Left 03/06/2018   Left hip arthroplasty, redo for loose hip arthroplasty. Procedure at [facility] hospital   INJECTION HIP INTRA ARTICULAR Left 11/2015   for OA by Dr Charlie Dolores  RENNA IMPLANT PLACEMENT  04/2021   Norleen Sharps MD ([facility])   INTERSTIM IMPLANT REVISION N/A 09/2021   Norleen Sharps MD ([facility])   LIGAMENT REPAIR Right 05/03/2023   Procedure: COLLATERAL LIGAMENT REPAIR;  Surgeon: Kit Norleen, MD;  Location: [facility];  Service: Orthopedics;  Laterality: Right;  general, local by surgeon 60 MIN   OTHER SURGICAL HISTORY Left 2016   Left L3/L4 medial nerve block and Left L5 Dorsal Ramus block Dr FABIENE Dolores   TOTAL HIP ARTHROPLASTY Left 07/25/2016   Procedure: LEFT TOTAL HIP ARTHROPLASTY ANTERIOR APPROACH;  Surgeon: Donnice Car, MD;  Location: [facility];  Service: Orthopedics;  Laterality: Left;   WEIL OSTEOTOMY Right 05/03/2023   Procedure: RIGHT 2ND WEIL OSTEOTOMY;  Surgeon: Kit Norleen, MD;  Location: [facility];  Service: Orthopedics;  Laterality: Right;  general, local by surgeon 60 MIN   Patient Active Problem List   Diagnosis Date Noted   Arthritis of carpometacarpal [facility]) joint of left thumb 09/07/2023   Low serum vitamin B12 01/10/2023   OSA (obstructive sleep apnea) 12/27/2022   Severe obstructive sleep apnea-hypopnea syndrome 12/22/2022   Hyperglycemia 12/11/2022   SOBOE (shortness of breath on exertion) 12/11/2022   Other fatigue 12/11/2022   Sleep related headaches 11/07/2022   Intractable  episodic cluster headache 11/07/2022   Psychophysiological insomnia 11/07/2022   Hallux rigidus of right foot 09/25/2022   Chronic pain of right knee 05/08/2022   Arthrosis of hand 12/20/2021   History of Clostridioides difficile colitis, required fecal transplantation 12/20/2021   Meibomian gland dysfunction (MGD) of both eyes 10/17/2021   Nuclear sclerosis of both eyes 10/17/2021   Epiretinal membrane (ERM) of left eye 10/17/2021   OAB (overactive bladder) 03/02/2021   Osteoarthritis of acromioclavicular joint 02/22/2021   Hyperhidrosis, scalp, 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   Chronic low back pain 05/14/2019   Sjogren syndrome (HCC) 01/27/2019   DDD (degenerative disc disease), cervical 12/27/2018   Cervical spondylosis 11/18/2018   DDD (degenerative disc disease), lumbar 11/18/2018   Keratoconjunctivitis sicca 11/18/2018   Chronic contact dermatitis 08/06/2018   Primary osteoarthritis of left knee 06/20/2018   Insulin  resistance 07/04/2017   Irritable bowel syndrome with diarrhea 04/03/2016   Left ventricular hypertrophy, mild 02/25/2016   Allergic rhinoconjunctivitis 07/02/2015   History of colonic polyps 07/01/2015   GERD (gastroesophageal reflux disease) 12/16/2014   Fibromyalgia 10/01/2013   Mood disorder (HCC) 10/01/2013   Hyperlipidemia 10/01/2013   Pure hypercholesterolemia 05/15/2013   Chronic migraine without aura 05/15/2013   Benign essential hypertension 10/28/2012   Hypothyroidism 11/09/2011   Chronic interstitial cystitis 11/09/2011    PCP: McDiarmid, Krystal, MD REFERRING PROVIDER: Daniel Norleen, PA-C REFERRING DIAG: s/p tendon repair Z 98.890  THERAPY DIAG:  Muscle weakness (generalized)  Other abnormalities of gait and mobility  Pain in right hip  Rationale for Evaluation and Treatment: Rehabilitation  ONSET DATE: 01/23/24   SUBJECTIVE:  SUBJECTIVE STATEMENT: The patient is s/p R glut med repair on 01/23/24. She initially had significant pain and used a RW until  6 weeks after surgery. She is using hte SPC, with pain in R Quadratus lumborum region and L MTP joint (due to h/o arthritis--gets intermittent injections in L thumb). She started standing hip abduction this week at home.  MD NOTE:  In physical therapy will work on abductor strengthening and gait training. Will work on balance. Will wean off of a walker on to a cane. Will stay on a cane until  is walking without a limp.   PERTINENT HISTORY: HTN, hypercholesterolemia, fibromyalgia, migraines, arthritis, concussion PAIN:  Are you having pain? Yes: NPRS scale: pain is a 5/10 Pain location: R QL, L cervical region, L 1st MCP, and R foot (these are prior conditions) Pain description: achy and sore Aggravating factors: *does not hurt in R hip -- occasional soreness end of day when fatigued Relieving factors: none--mild discomfort  PRECAUTIONS: Other: The patient will be TDWB x 6 weeks, Abductor hip precautions x 6 weeks to the right side.  WEIGHT BEARING RESTRICTIONS: Yes initially TDWB x 6 weeks (she is beyond that per surgical date)  FALLS:  Has patient fallen in last 6 months? No  LIVING ENVIRONMENT: Lives with: lives with their spouse Lives in: House/apartment Stairs: Yes: Internal: 12-14 steps; on right going up Has following equipment at home: Single point cane  OCCUPATION: Retired  PLOF: Independent  PATIENT GOALS: No pain, no cane  NEXT MD VISIT: late August  OBJECTIVE:  Note: Objective measures were completed at Evaluation unless otherwise noted.  PATIENT SURVEYS:  LEFS : 47.5%  COGNITION: Overall cognitive status: Within functional limits for tasks assessed     SENSATION: WFL  POSTURE: No Significant postural limitations  PALPATION: Mild soreness in lateral hip  LOWER EXTREMITY ROM: AROM to 90 degrees hip flexion Passive ROM Right eval Left eval  Hip flexion 95   Hip extension    Hip abduction 35   Hip adduction    Hip internal rotation 30   Hip external  rotation 30   Knee flexion    Knee extension    Ankle dorsiflexion    Ankle plantarflexion    Ankle inversion    Ankle eversion     (Blank rows = not tested)  LOWER EXTREMITY MMT: did not test due to post surgical status-- will assess as we initiate strengthening progression in PT MMT Right eval Left eval  Hip flexion    Hip extension    Hip abduction    Hip adduction    Hip internal rotation    Hip external rotation    Knee flexion    Knee extension    Ankle dorsiflexion    Ankle plantarflexion    Ankle inversion    Ankle eversion     (Blank rows = not tested)  BALANCE: R single limb x 2 seconds  L single limb x 10 seconds  GAIT: Distance walked: 150 ft Assistive device utilized: Single point cane and None Level of assistance: Modified independence Comments: 3.2 ft/sec-- patient does start off without a limp and after 100 ft has a R antalgic gait pattern *PT and patient discussed still needing the cane when outdoors and in the evening when fatigued                                                                                                                            [facility] Adult PT Treatment:  DATE: 03/17/24 Therapeutic Exercise: See HEP established at eval Self Care: Educated on use of cane when out of her home and in the evening when fatigued (to avoid limping/antalgic gait pattern)  PATIENT EDUCATION:  Education details: HEP Person educated: Patient Education method: Programmer, multimedia, Facilities manager, and Handouts Education comprehension: verbalized understanding and returned demonstration  HOME EXERCISE PROGRAM: Access Code: [facility] URL: [email or web address] Date: 03/17/2024 Prepared by: Tawni Ferrier  Program Notes Start with 1 set of exercises this week (7/7) and ensure you tolerate them without pain before adding a 2nd set.  Exercises - Supine Heel Slide  - 1 x daily - 7 x weekly - 2 sets - 10  reps - Supine March  - 12 x daily - 7 x weekly - 2 sets - 10 reps - Supine Hip Adduction Isometric with Ball  - 1 x daily - 7 x weekly - 2 sets - 10 reps - Supine Bridge  - 2 x daily - 7 x weekly - 1 sets - 10 reps - Standing Hip Extension with Counter Support  - 2 x daily - 7 x weekly - 1 sets - 10 reps - Standing Hip Abduction with Counter Support  - 2 x daily - 7 x weekly - 1 sets - 10 reps - Standing Shoulder and Trunk Flexion at Table  - 1 x daily - 7 x weekly - 1 sets - 2 reps - 20 seconds hold  ASSESSMENT:  CLINICAL IMPRESSION: Patient is a 68 y.o. female who was seen today for physical therapy evaluation and treatment s/p R glut med repair. She is 6.5 weeks post op today and cleared for WBAT and to begin strengthening. PT initiated HEP today for gentle ROM, glut activations, and to also address R QL pain. Patient feels that cane use is flaring other pain including L 1st MCP joint, L shoulder, and R QL. PT discussed the rationale for cane use discussing goal to normalize gait and avoid an antalgic pattern, therefore, patient to use when in the community and in the evening if she is fatigued. PT to address deficits and promote return to prior functional status.   OBJECTIVE IMPAIRMENTS: Abnormal gait, decreased activity tolerance, decreased balance, decreased mobility, difficulty walking, decreased ROM, decreased strength, increased fascial restrictions, impaired flexibility, and pain.   ACTIVITY LIMITATIONS: lifting, bending, squatting, stairs, and locomotion level  PARTICIPATION LIMITATIONS: meal prep, cleaning, and community activity  PERSONAL FACTORS: 3+ comorbidities: see PMH are also affecting patient's functional outcome.   REHAB POTENTIAL: Good  CLINICAL DECISION MAKING: Evolving/moderate complexity  EVALUATION COMPLEXITY: Moderate   GOALS: Goals reviewed with patient? Yes  SHORT TERM GOALS: Target date: 04/16/24  The patient will be indep with initial HEP Baseline:  initiated at eval Goal status: INITIAL  2.  The patient will improve LEFS by 8% to demonstrate improved functional abilities. Baseline:  47.5% Goal status: INITIAL  3.   The patient will  demonstrate normal gait pattern with no evidence of antalgic pattern x 500 ft to d/c cane for community distances. Baseline:  Gets antalgic pattern after 100 ft in clinic Goal status: INITIAL  4.  The patient will maintain single leg standing R side x 8 seconds to demo good hip stability for functional loading. Baseline:  2 seconds, L is 10 seconds. Goal status: INITIAL  LONG TERM GOALS: Target date: 05/17/24  The patient will be indep with progression of HEP. Baseline:  initiated at eval Goal status: INITIAL  2.  The patient will improve LEFS by  15% to demonstrate improved functional abilities. Baseline: 47.5% Goal status: INITIAL  3.  The patient will report no pain in R quadratus lumborum region demonstrating reduced compensatory strategies with gait. Baseline:  Is using QL for hip hike and has pain at eval Goal status: INITIAL  4.  The patient will be able to negotiate 12 steps with one rail and reciprocal pattern mod indep. Baseline:  has done steps 1x since d/c home Goal status: INITIAL  5.  The patient will demo improved R LE strength demonstrating SLR x 10 reps. Baseline:   to assess MMT Goal status: INITIAL  PLAN:  PT FREQUENCY: 2x/week  PT DURATION: 8 weeks  PLANNED INTERVENTIONS: [id number]- PT Re-evaluation, 97750- Physical Performance Testing, 97110-Therapeutic exercises, 97530- Therapeutic activity, [id number]- Neuromuscular re-education, 97535- Self Care, [id number]- Manual therapy, [phone]- Gait training, [phone]- Aquatic Therapy, [phone]- Electrical stimulation (unattended), [phone] (1-2 muscles), [phone] (3+ muscles)- Dry Needling, Patient/Family education, Balance training, Stair training, Taping, Joint mobilization, Cryotherapy, and Moist heat  PLAN FOR NEXT SESSION: Progress HEP for R hip  strengthening, normalize gait pattern without device, work on QL lengthening due to pain, work on stairs and R LE stance control.    Twilla Khouri, PT 03/17/2024, 12:09 PM

## 2024-03-19 ENCOUNTER — Ambulatory Visit

## 2024-03-19 DIAGNOSIS — M25551 Pain in right hip: Secondary | ICD-10-CM

## 2024-03-19 DIAGNOSIS — M6281 Muscle weakness (generalized): Secondary | ICD-10-CM

## 2024-03-19 DIAGNOSIS — R2689 Other abnormalities of gait and mobility: Secondary | ICD-10-CM

## 2024-03-19 NOTE — Therapy (Signed)
 OUTPATIENT PHYSICAL THERAPY LOWER EXTREMITY TREATMENT   Patient Name: Molly Giles MRN: [id number] DOB:[birth year], 68 y.o., female Today's Date: 03/19/2024  END OF SESSION:  PT End of Session - 03/19/24 1450     Visit Number 2    Number of Visits 16    Date for PT Re-Evaluation 04/16/24    Authorization Type medicare and BCBS supplemental    Progress Note Due on Visit 10    PT Start Time 1449    PT Stop Time 1528    PT Time Calculation (min) 39 min    Activity Tolerance Patient tolerated treatment well    Behavior During Therapy Molly Giles for tasks assessed/performed           Past Medical History:  Diagnosis Date   Abdominal bloating 05/10/2021   Abdominal discomfort, generalized 05/07/2016   Acquired hallux rigidus of right foot 09/15/2022   Acute gastritis 11/19/2020   Allergic rhinoconjunctivitis 07/02/2015   Anal fissure 11/19/2020   Anterior to posterior tear of superior glenoid labrum of left shoulder 02/22/2021   Arthritis    Arthritis of carpometacarpal (CMC) joint of left thumb 09/07/2023   Aseptic loosening of prosthetic hip, initial encounter (HCC) 11/29/2017   Benign essential hypertension 10/28/2012   Benign positional vertigo 04/2015   Responded well to [facility]   Bruxism (teeth grinding)    Cervical spondylosis 11/18/2018   Chronic contact dermatitis 08/06/2018   Per allergist Molly Molly Giles.    Chronic interstitial cystitis 11/09/2011   Managed by Molly Giles urology branch in Molly Giles [place] 5 years ago with Molly Molly Giles in Molly Giles, has relief of symptoms until now     Chronic low back pain 05/14/2019   Chronic migraine 02/25/2016   Per Molly Molly Giles review in notes from Molly Giles from September 2015. Showed total headache days last month 18. Severe headache days 7 days. Moderate headache days 5 days. Mild headache days last month sick days. Days without headache last month 10 days. Symptoms associated with photophobia, phonophobia,  osmophobia, neck pain, dizziness, jaw pain, nasal congestion, vision disturbances, tingling and numbness, weakness and worsening with activity. Each headache attack last 3 hours depending on treatment in severity. Left side, the right side, easier side, the frontal area in the back of the head. Characterized as throbbing, pressure, tightness, squeezing, stabbing and burning    Chronic migraine without aura 05/15/2013    Molly Molly Giles   Chronic tonsillitis 01/27/2019   DDD (degenerative disc disease), cervical 11/18/2018   DDD (degenerative disc disease), lumbar    Depression    Disc displacement, lumbar    Dysphagia 10/11/2021   Dysrhythmia    seen by Molly Blanca- not a problem since she has been on Bystolic    Epiretinal membrane (ERM) of left eye 10/17/2021   Periodic ophthalmological monitoring     Episodic cluster headache, not intractable 03/06/2017   Essential hypertension 05/15/2013   Family history of adverse reaction to anesthesia    Brother- N/V   Family history of premature CAD 05/15/2013   Fibromyalgia 10/01/2013   Management by Molly Molly Giles (Rheum)     Fibromyalgia syndrome 07/01/2015   Management by Molly Molly Giles (Rheum)    GERD (gastroesophageal reflux disease) 12/16/2014   H/O seasonal allergies    Hallux rigidus of right foot 09/25/2022   Hammer toe    Left great toe   Hashimoto's thyroiditis    Per patient, diagnosed by Molly Giles   Hearing loss of  both ears 07/27/2015   mild to borderline moderate low frequency hearing loss improving to within normal limits bilaterally on audiology testing at Molly Giles in November 2016.     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   Hyperlipidemia 1998   Hypokalemia due to excessive gastrointestinal loss of potassium 07/28/2019   Hypothyroidism    Iliopsoas bursitis of left hip 05/08/2022   Impairment of balance 02/2015   Consequent of postconcussive syndrome   Injury of triangular fibrocartilage complex (TFCC) 02/25/2019   Injury of triangular fibrocartilage complex of left wrist 02/25/2019   Dx 02/25/19 Molly Giles (Molly Giles)   Insulin  resistance 07/04/2017   Internal hemorrhoid 01/28/2021   Internal hemorrhoid seen on colonoscopy 10/2020 Molly Sol Giles Molly Giles)   Interstitial cystitis    Irritable bowel syndrome with diarrhea 04/03/2016   Keratoconjunctivitis sicca 11/18/2018   (+) ANA     Left ventricular hypertrophy, mild 02/25/2016   Molly Giles report 06/17/15 showing EF55-60%, mild LVH and G1DD    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HCC) 03/10/2018   Molly Giles   Low serum vitamin B12 01/10/2023   Lumbar facet joint pain    Meibomian gland dysfunction (MGD) of both eyes 10/17/2021   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  Added automatically from request for surgery [id number]     Formatting of this note might be different from the original.  Added automatically from request for surgery [id number]     Obesity (BMI 30.0-34.9) 01/29/2017   Odynophagia 12/20/2021   OSA (obstructive sleep apnea) 12/27/2022   Osteoarthritis of acromioclavicular joint 02/22/2021    Osteoarthritis of left hip 11/01/2015   MRI order by Molly Giles (ortho) 10/2015 showed significant arthritis of left hip joint with cystic changes in femoral head c/w osteoarthritis   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, lumbar region 10/30/2011   Other insomnia 11/09/2016   Overweight    Pain in joint of left shoulder 11/09/2016   Pain in joint, multiple sites 11/18/2018   Palpitations 05/15/2013   [facility] manages   Perianal candidiasis 12/20/2021   Periodic limb movement sleep disorder 03/28/2017   Perirectal cyst 05/07/2016   Plantar fasciitis of right foot 11/10/2022   Poison ivy dermatitis 03/24/2021   PONV (postoperative nausea and vomiting)    Positive ANA (antinuclear antibody) 11/18/2018   Post concussion syndrome 06/06/2015   Post concussive syndrome 07/14/2015   Ms Heffner's post-concussive syndrome manifesting in vertigo and headache, mood changes, poor balance, dizziness, and decreased concentration per Molly Molly Giles at [facility].    Posterior vitreous detachment of right eye 2014   Premature menopause 12/20/2021   Primary osteoarthritis of left knee 06/20/2018   Psychophysiological insomnia 11/07/2022   Pure hypercholesterolemia  05/15/2013   Rectal abscess 05/10/2021   S/P left THA, AA 07/25/2016   S/P revision of total hip 04/10/2018   S/P tendon repair 01/23/2024   Sacroiliac inflammation (HCC) 08/18/2021   Severe obstructive sleep apnea-hypopnea syndrome 12/22/2022   Shingles    Shortness of breath dyspnea    with exertion   Sicca syndrome (HCC) 11/18/2018   (+) ANA   Sjogren syndrome (HCC) 01/27/2019   Sjogren's syndrome (HCC) 01/27/2019   Sleep walking and eating 03/06/2017   Snoring 03/06/2017   Spondylosis of lumbar region without myelopathy or radiculopathy 10/30/2011   Status post total hip replacement, left 09/20/2017   TFC (triangular fibrocartilage complex) injury 02/25/2019   Thyroid  nodule 08/11/2009   Findings:  The thyroid  gland is within normal limits in size.  The gland is diffusely inhomogeneous. A small solid nodule is noted in the lower pole  medially on the right of 7 x 6 x 8 mm. A small solid nodule is noted inferiorly on the left of 3 x 3 x 4 mm.  IMPRESSION:  The thyroid  gland is within normal limits in size with only small solid nodules present, the largest of only 8 mm in diameter on the right.     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    Trochanteric bursitis, right hip 04/26/2020   Tubular adenoma of colon 01/28/2021   Colonoscopy screening 4 mm tubular Adenoma polyp Marilu Sol, Giles Molly Giles)   Tubular adenoma of colon 01/28/2021   Colonoscopy screening 4 mm tubular Adenoma polyp Marilu Sol, Giles Molly Giles)   Vasomotor symptoms due to menopause 04/19/2017   Vertigo 03/19/2023   Vitamin D  deficiency 05/08/2017   Vulvitis 07/22/2020   Yeast vaginitis 07/11/2019   Past Surgical History:  Procedure Laterality Date   arthroscopy Left 01/2022   with SAD and DCR, K. Supple Giles   Bladder dilitation     x 3   BREAST BIOPSY Right 2011   Benign histology   CARDIOVASCULAR STRESS TEST  2000   Unremarkable per pt report   CARPOMETACARPAL JOINT ARTHROTOMY Right 2011   COLONOSCOPY     COLONOSCOPY WITH PROPOFOL  N/A 04/21/2015   Procedure: COLONOSCOPY WITH PROPOFOL ;  Surgeon: Belvie Just, Giles;  Location: [facility];  Service: Endoscopy;  Laterality: N/A;   CYSTOSCOPY W/ DILATION OF BLADDER N/A    EPIDURAL BLOCK INJECTION Left 04/12/2016   Left Medial Nerve Block and Left L5 ramus block, Molly Charlie Dolores    EPIDURAL BLOCK INJECTION  03/21/2016   Left L3-4 medial branch block and Left L5 & dorsal ramus block    EPIDURAL BLOCK INJECTION N/A 10/25/2016   Charlie Dolores, Giles. Lumbar medial branch block   EPIDURAL BLOCK INJECTION N/A 02/09/2017   Charlie Dolores, Giles.  Bilateral L3/4 medial branch block, bilateral L5 dorsal ramus  block   EPIDURAL BLOCK INJECTION N/A 07/04/2017   Charlie Dolores, Giles   EXCISION MORTON'S NEUROMA Right 05/03/2023   Procedure: EXCISION MORTON'S NEUROMA;  Surgeon: Kit Rush, Giles;  Location: [facility];  Service: Orthopedics;  Laterality: Right;  general, local by surgeon 60 MIN   FECAL TRANSPLANT  04/21/2015   Procedure: FECAL TRANSPLANT;  Surgeon: Belvie Just, Giles;  Location: [facility] ENDOSCOPY;  Service: Endoscopy;;   HIP ARTHROPLASTY Left    HIP ARTHROSCOPY Left 03/06/2018   Left hip arthroplasty, redo for loose hip arthroplasty. Procedure at [facility]   INJECTION HIP INTRA ARTICULAR  Left 11/2015   for OA by Molly Charlie Bonner REDBIRD IMPLANT PLACEMENT  04/2021   Norleen Sharps Giles ([facility])   INTERSTIM IMPLANT REVISION N/A 09/2021   Norleen Sharps Giles ([facility])   LIGAMENT REPAIR Right 05/03/2023   Procedure: COLLATERAL LIGAMENT REPAIR;  Surgeon: Kit Norleen, Giles;  Location: [facility];  Service: Orthopedics;  Laterality: Right;  general, local by surgeon 60 MIN   OTHER SURGICAL HISTORY Left 2016   Left L3/L4 medial nerve block and Left L5 Dorsal Ramus block Molly FABIENE Bonner   TOTAL HIP ARTHROPLASTY Left 07/25/2016   Procedure: LEFT TOTAL HIP ARTHROPLASTY ANTERIOR APPROACH;  Surgeon: Donnice Car, Giles;  Location: [facility];  Service: Orthopedics;  Laterality: Left;   WEIL OSTEOTOMY Right 05/03/2023   Procedure: RIGHT 2ND WEIL OSTEOTOMY;  Surgeon: Kit Norleen, Giles;  Location:  [facility];  Service: Orthopedics;  Laterality: Right;  general, local by surgeon 60 MIN   Patient Active Problem List   Diagnosis Date Noted   Arthritis of carpometacarpal [facility]) joint of left thumb 09/07/2023   Low serum vitamin B12 01/10/2023   OSA (obstructive sleep apnea) 12/27/2022   Severe obstructive sleep apnea-hypopnea syndrome 12/22/2022   Hyperglycemia 12/11/2022   SOBOE (shortness of breath on exertion) 12/11/2022   Other fatigue  12/11/2022   Sleep related headaches 11/07/2022   Intractable episodic cluster headache 11/07/2022   Psychophysiological insomnia 11/07/2022   Hallux rigidus of right foot 09/25/2022   Chronic pain of right knee 05/08/2022   Arthrosis of hand 12/20/2021   History of Clostridioides difficile colitis, required fecal transplantation 12/20/2021   Meibomian gland dysfunction (MGD) of both eyes 10/17/2021   Nuclear sclerosis of both eyes 10/17/2021   Epiretinal membrane (ERM) of left eye 10/17/2021   OAB (overactive bladder) 03/02/2021   Osteoarthritis of acromioclavicular joint 02/22/2021   Hyperhidrosis, scalp, 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   Chronic low back pain 05/14/2019   Sjogren syndrome (HCC) 01/27/2019   DDD (degenerative disc disease), cervical 12/27/2018   Cervical spondylosis 11/18/2018   DDD (degenerative disc disease), lumbar 11/18/2018   Keratoconjunctivitis sicca 11/18/2018   Chronic contact dermatitis 08/06/2018   Primary osteoarthritis of left knee 06/20/2018   Insulin  resistance 07/04/2017   Irritable bowel syndrome with diarrhea 04/03/2016   Left ventricular hypertrophy, mild 02/25/2016   Allergic rhinoconjunctivitis 07/02/2015   History of colonic polyps 07/01/2015   GERD (gastroesophageal reflux disease) 12/16/2014   Fibromyalgia 10/01/2013   Mood disorder (HCC) 10/01/2013   Hyperlipidemia 10/01/2013   Pure hypercholesterolemia 05/15/2013   Chronic migraine without aura 05/15/2013   Benign essential hypertension 10/28/2012   Hypothyroidism 11/09/2011   Chronic interstitial cystitis 11/09/2011    PCP: McDiarmid, Krystal, Giles REFERRING PROVIDER: Daniel Norleen, PA-C REFERRING DIAG: s/p tendon repair Z 98.890  THERAPY DIAG:  Muscle weakness (generalized)  Other abnormalities of gait and mobility  Pain in right hip  Rationale for Evaluation and Treatment: Rehabilitation  ONSET DATE: 01/23/24   SUBJECTIVE:  SUBJECTIVE STATEMENT: It feels ok. If I walk a lot I am sore.  I was sore after the exercises yesterday.   Giles NOTE:  In physical therapy will work on abductor strengthening and gait training. Will work on balance. Will wean off of a walker on to a cane. Will stay on a cane until is walking without a limp.   PERTINENT HISTORY: HTN, hypercholesterolemia, fibromyalgia, migraines, arthritis, concussion PAIN:  Are you having pain? Yes: NPRS scale: 3 Pain location:  R QL, L cervical region, L 1st MCP, and R foot (these are prior conditions) Pain description: achy and sore Aggravating factors: *does not hurt in R hip -- occasional soreness end of day when fatigued Relieving factors: none--mild discomfort  PRECAUTIONS: Other: The patient will be TDWB x 6 weeks, Abductor hip precautions x 6 weeks to the right side.  WEIGHT BEARING RESTRICTIONS: Yes initially TDWB x 6 weeks (she is beyond that per surgical date)  FALLS:  Has patient fallen in last 6 months? No  LIVING ENVIRONMENT: Lives with: lives with their spouse Lives in: House/apartment Stairs: Yes: Internal: 12-14 steps; on right going up Has following equipment at home: Single point cane  OCCUPATION: Retired  PLOF: Independent  PATIENT GOALS: No pain, no cane  NEXT Giles VISIT: late August  OBJECTIVE:  Note: Objective measures were completed at Evaluation unless otherwise noted.  PATIENT SURVEYS:  LEFS : 47.5%  COGNITION: Overall cognitive status: Within functional limits for tasks assessed     SENSATION: WFL  POSTURE: No Significant postural limitations  PALPATION: Mild soreness in lateral hip  LOWER EXTREMITY ROM: AROM to 90 degrees hip flexion Passive ROM Right eval Left eval  Hip flexion 95   Hip extension    Hip abduction 35   Hip adduction    Hip internal rotation 30   Hip external rotation 30   Knee flexion    Knee extension    Ankle dorsiflexion    Ankle plantarflexion    Ankle inversion    Ankle eversion     (Blank rows = not tested)  LOWER EXTREMITY MMT:  did not test due to post surgical status-- will assess as we initiate strengthening progression in PT MMT Right eval Left eval  Hip flexion    Hip extension    Hip abduction    Hip adduction    Hip internal rotation    Hip external rotation    Knee flexion    Knee extension    Ankle dorsiflexion    Ankle plantarflexion    Ankle inversion    Ankle eversion     (Blank rows = not tested)  BALANCE: R single limb x 2 seconds  L single limb x 10 seconds  GAIT: Distance walked: 150 ft Assistive device utilized: Single point cane and None Level of assistance: Modified independence Comments: 3.2 ft/sec-- patient does start off without a limp and after 100 ft has a R antalgic gait pattern *PT and patient discussed still needing the cane when outdoors and in the evening when fatigued [facility] Treatment:                                                DATE: 03/19/24 Therapeutic Exercise: Hip abduction isometric 2 x 10; 5 sec hold Walking in clinic 2 x 15 ft without AD LAQ 2 x 10 @ 1.5 lbs Hip ER/IR with stool 2 x 10  Seated HS curl 2 x 10 green band  Quadruped rocking x 10   Neuromuscular re-ed: SLS with UE support focusing on level pelvis x 5 reps  Self Care: Ice for pain/soreness Wean from cane as able without limp  [facility] Adult PT Treatment:                                                DATE: 03/17/24 Therapeutic Exercise: See HEP established at eval Self Care: Educated on use of cane when out of her home and in the evening when fatigued (to avoid limping/antalgic gait pattern)  PATIENT EDUCATION:  Education details: HEP Person educated: Patient Education method: Programmer, multimedia, Facilities manager, and Handouts Education comprehension: verbalized understanding and returned demonstration  HOME EXERCISE PROGRAM: Access Code: [facility] URL:  [email or web address] Date: 03/19/2024 Prepared by: Lucie Meeter  Program Notes Start with 1 set of exercises this week (7/7) and ensure you tolerate them without pain before adding a 2nd set.  Exercises - Supine Heel Slide  - 1 x daily - 7 x weekly - 2 sets - 10 reps - Supine March  - 1 x daily - 7 x weekly - 2 sets - 10 reps - Supine Hip Adduction Isometric with Ball  - 1 x daily - 7 x weekly - 2 sets - 10 reps - Standing Hip Extension with Counter Support  - 2 x daily - 7 x weekly - 1 sets - 10 reps - Standing Hip Abduction with Counter Support  - 2 x daily - 7 x weekly - 1 sets - 10 reps - Standing Shoulder and Trunk Flexion at Table  - 1 x daily - 7 x weekly - 1 sets - 2 reps - 20 seconds hold - Hooklying Isometric Hip Abduction with Belt  - 1 x daily - 7 x weekly - 2 sets - 10 reps - Standing Shoulder and Trunk Flexion at Table  - 1 x daily - 7 x weekly - 1 sets - 2 reps - 20 seconds hold  ASSESSMENT:  CLINICAL IMPRESSION: Patient tolerated session well today focusing on LE strengthening and hip ROM activity with good tolerance. Challenged with engaging glute with SLS on the RLE with ability to maintain activation for brief period before fatigue and strain occur. When ambulating short distance in clinic she has no obvious limp. Was recommended that she could begin to wean from cane as long as limp does not occur with patient verbalizing understanding.   EVAL: Patient is a 68 y.o. female who was seen today for physical therapy evaluation and treatment s/p R glut med repair. She is 6.5 weeks post op today and cleared for WBAT and to begin strengthening. PT initiated HEP today for gentle ROM, glut activations, and to also address R QL pain. Patient feels that cane use is flaring other pain including L 1st MCP joint, L shoulder, and R QL. PT discussed the rationale for cane use discussing goal to normalize gait and avoid an antalgic pattern, therefore, patient to use when in  the community and in the evening if she is fatigued. PT to address deficits and promote return to prior functional status.   OBJECTIVE IMPAIRMENTS: Abnormal gait, decreased activity tolerance, decreased balance, decreased mobility, difficulty walking, decreased ROM, decreased strength, increased fascial restrictions, impaired flexibility, and pain.   ACTIVITY LIMITATIONS: lifting, bending, squatting, stairs, and locomotion level  PARTICIPATION LIMITATIONS: meal prep, cleaning, and community activity  PERSONAL FACTORS: 3+ comorbidities: see PMH are also affecting patient's functional outcome.   REHAB POTENTIAL: Good  CLINICAL DECISION MAKING: Evolving/moderate complexity  EVALUATION COMPLEXITY: Moderate   GOALS: Goals reviewed  with patient? Yes  SHORT TERM GOALS: Target date: 04/16/24  The patient will be indep with initial HEP Baseline: initiated at eval Goal status: INITIAL  2.  The patient will improve LEFS by 8% to demonstrate improved functional abilities. Baseline:  47.5% Goal status: INITIAL  3.   The patient will  demonstrate normal gait pattern with no evidence of antalgic pattern x 500 ft to d/c cane for community distances. Baseline:  Gets antalgic pattern after 100 ft in clinic Goal status: INITIAL  4.  The patient will maintain single leg standing R side x 8 seconds to demo good hip stability for functional loading. Baseline:  2 seconds, L is 10 seconds. Goal status: INITIAL  LONG TERM GOALS: Target date: 05/17/24  The patient will be indep with progression of HEP. Baseline:  initiated at eval Goal status: INITIAL  2.  The patient will improve LEFS by 15% to demonstrate improved functional abilities. Baseline: 47.5% Goal status: INITIAL  3.  The patient will report no pain in R quadratus lumborum region demonstrating reduced compensatory strategies with gait. Baseline:  Is using QL for hip hike and has pain at eval Goal status: INITIAL  4.  The patient will  be able to negotiate 12 steps with one rail and reciprocal pattern mod indep. Baseline:  has done steps 1x since d/c home Goal status: INITIAL  5.  The patient will demo improved R LE strength demonstrating SLR x 10 reps. Baseline:   to assess MMT Goal status: INITIAL  PLAN:  PT FREQUENCY: 2x/week  PT DURATION: 8 weeks  PLANNED INTERVENTIONS: [id number]- PT Re-evaluation, 97750- Physical Performance Testing, 97110-Therapeutic exercises, 97530- Therapeutic activity, [id number]- Neuromuscular re-education, 97535- Self Care, [id number]- Manual therapy, [phone]- Gait training, [phone]- Aquatic Therapy, [phone]- Electrical stimulation (unattended), [phone] (1-2 muscles), [phone] (3+ muscles)- Dry Needling, Patient/Family education, Balance training, Stair training, Taping, Joint mobilization, Cryotherapy, and Moist heat  PLAN FOR NEXT SESSION: Progress HEP for R hip strengthening, normalize gait pattern without device, work on QL lengthening due to pain, work on stairs and R LE stance control.   Evalin Shawhan, PT, DPT, ATC 03/19/24 3:30 PM

## 2024-03-21 ENCOUNTER — Other Ambulatory Visit (INDEPENDENT_AMBULATORY_CARE_PROVIDER_SITE_OTHER): Payer: Self-pay | Admitting: Family Medicine

## 2024-03-21 DIAGNOSIS — E669 Obesity, unspecified: Secondary | ICD-10-CM

## 2024-03-24 ENCOUNTER — Ambulatory Visit: Admitting: Rehabilitative and Restorative Service Providers"

## 2024-03-24 ENCOUNTER — Encounter: Payer: Self-pay | Admitting: Rehabilitative and Restorative Service Providers"

## 2024-03-24 DIAGNOSIS — R2689 Other abnormalities of gait and mobility: Secondary | ICD-10-CM

## 2024-03-24 DIAGNOSIS — M6281 Muscle weakness (generalized): Secondary | ICD-10-CM

## 2024-03-24 DIAGNOSIS — M25551 Pain in right hip: Secondary | ICD-10-CM

## 2024-03-24 NOTE — Therapy (Signed)
 OUTPATIENT PHYSICAL THERAPY LOWER EXTREMITY TREATMENT   Patient Name: Molly Giles MRN: [id number] DOB:[birth year], 68 y.o., female Today's Date: 03/24/2024  END OF SESSION:  PT End of Session - 03/24/24 1403     Visit Number 3    Number of Visits 16    Date for PT Re-Evaluation 04/16/24    Authorization Type medicare and BCBS supplemental    Progress Note Due on Visit 10    PT Start Time 1403    PT Stop Time 1445    PT Time Calculation (min) 42 min    Activity Tolerance Patient tolerated treatment well    Behavior During Therapy [facility] for tasks assessed/performed         Past Medical History:  Diagnosis Date   Abdominal bloating 05/10/2021   Abdominal discomfort, generalized 05/07/2016   Acquired hallux rigidus of right foot 09/15/2022   Acute gastritis 11/19/2020   Allergic rhinoconjunctivitis 07/02/2015   Anal fissure 11/19/2020   Anterior to posterior tear of superior glenoid labrum of left shoulder 02/22/2021   Arthritis    Arthritis of carpometacarpal (CMC) joint of left thumb 09/07/2023   Aseptic loosening of prosthetic hip, initial encounter (HCC) 11/29/2017   Benign essential hypertension 10/28/2012   Benign positional vertigo 04/2015   Responded well to [facility]   Bruxism (teeth grinding)    Cervical spondylosis 11/18/2018   Chronic contact dermatitis 08/06/2018   Per allergist Dr Almarie Scala.    Chronic interstitial cystitis 11/09/2011   Managed by [facility] urology branch in [place] hydrodistention 5 years ago with Dr Claudene in [place], has relief of symptoms until now     Chronic low back pain 05/14/2019   Chronic migraine 02/25/2016   Per Dr Ines review in notes from [facility] from September 2015. Showed total headache days last month 18. Severe headache days 7 days. Moderate headache days 5 days. Mild headache days last month sick days. Days without headache last month 10 days. Symptoms associated with photophobia, phonophobia,  osmophobia, neck pain, dizziness, jaw pain, nasal congestion, vision disturbances, tingling and numbness, weakness and worsening with activity. Each headache attack last 3 hours depending on treatment in severity. Left side, the right side, easier side, the frontal area in the back of the head. Characterized as throbbing, pressure, tightness, squeezing, stabbing and burning    Chronic migraine without aura 05/15/2013    Dr Ines [facility]   Chronic tonsillitis 01/27/2019   DDD (degenerative disc disease), cervical 11/18/2018   DDD (degenerative disc disease), lumbar    Depression    Disc displacement, lumbar    Dysphagia 10/11/2021   Dysrhythmia    seen by dr Blanca- not a problem since she has been on Bystolic    Epiretinal membrane (ERM) of left eye 10/17/2021   Periodic ophthalmological monitoring     Episodic cluster headache, not intractable 03/06/2017   Essential hypertension 05/15/2013   Family history of adverse reaction to anesthesia    Brother- N/V   Family history of premature CAD 05/15/2013   Fibromyalgia 10/01/2013   Management by Dr GORMAN. Devashwar (Rheum)     Fibromyalgia syndrome 07/01/2015   Management by Dr GORMAN. Devashwar ([facility]he[facility])    GERD (gastroesophageal reflux disease) 12/16/2014   H/O seasonal allergies    Hallux rigidus of right foot 09/25/2022   Hammer toe    Left great toe   Hashimoto's thyroiditis    Per patient, diagnosed by Dr. Sharyne Pacini   Hearing loss of both ears  07/27/2015   mild to borderline moderate low frequency hearing loss improving to within normal limits bilaterally on audiology testing at [facility] in November 2016.     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   Hyperlipidemia 1998   Hypokalemia due to excessive gastrointestinal loss of potassium 07/28/2019   Hypothyroidism    Iliopsoas bursitis of left hip 05/08/2022   Impairment of balance 02/2015   Consequent of postconcussive syndrome   Injury of triangular fibrocartilage complex (TFCC) 02/25/2019   Injury of triangular fibrocartilage complex of left wrist 02/25/2019   Dx 02/25/19 Prentice Pagan IV MD ([facility])   Insulin  resistance 07/04/2017   Internal hemorrhoid 01/28/2021   Internal hemorrhoid seen on colonoscopy 10/2020 REGINOLD Sol MD [facility])   Interstitial cystitis    Irritable bowel syndrome with diarrhea 04/03/2016   Keratoconjunctivitis sicca 11/18/2018   (+) ANA     Left ventricular hypertrophy, mild 02/25/2016   ECHOcardiogram report 06/17/15 showing EF55-60%, mild LVH and G1DD    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HCC) 03/10/2018   [facility]   Low serum vitamin B12 01/10/2023   Lumbar facet joint pain    Meibomian gland dysfunction (MGD) of both eyes 10/17/2021   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  Added automatically from request for surgery [id number]     Formatting of this note might be different from the original.  Added automatically from request for surgery [id number]     Obesity (BMI 30.0-34.9) 01/29/2017   Odynophagia 12/20/2021   OSA (obstructive sleep apnea) 12/27/2022   Osteoarthritis of acromioclavicular joint 02/22/2021    Osteoarthritis of left hip 11/01/2015   MRI order by Dr Ernie (ortho) 10/2015 showed significant arthritis of left hip joint with cystic changes in femoral head c/w osteoarthritis   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, lumbar region 10/30/2011   Other insomnia 11/09/2016   Overweight    Pain in joint of left shoulder 11/09/2016   Pain in joint, multiple sites 11/18/2018   Palpitations 05/15/2013   [facility] manages   Perianal candidiasis 12/20/2021   Periodic limb movement sleep disorder 03/28/2017   Perirectal cyst 05/07/2016   Plantar fasciitis of right foot 11/10/2022   Poison ivy dermatitis 03/24/2021   PONV (postoperative nausea and vomiting)    Positive ANA (antinuclear antibody) 11/18/2018   Post concussion syndrome 06/06/2015   Post concussive syndrome 07/14/2015   Ms Burkle's post-concussive syndrome manifesting in vertigo and headache, mood changes, poor balance, dizziness, and decreased concentration per Dr Ines at [facility].    Posterior vitreous detachment of right eye 2014   Premature menopause 12/20/2021   Primary osteoarthritis of left knee 06/20/2018   Psychophysiological insomnia 11/07/2022   Pure hypercholesterolemia 05/15/2013  Rectal abscess 05/10/2021   S/P left THA, AA 07/25/2016   S/P revision of total hip 04/10/2018   S/P tendon repair 01/23/2024   Sacroiliac inflammation (HCC) 08/18/2021   Severe obstructive sleep apnea-hypopnea syndrome 12/22/2022   Shingles    Shortness of breath dyspnea    with exertion   Sicca syndrome (HCC) 11/18/2018   (+) ANA   Sjogren syndrome (HCC) 01/27/2019   Sjogren's syndrome (HCC) 01/27/2019   Sleep walking and eating 03/06/2017   Snoring 03/06/2017   Spondylosis of lumbar region without myelopathy or radiculopathy 10/30/2011   Status post total hip replacement, left 09/20/2017   TFC (triangular fibrocartilage complex) injury 02/25/2019   Thyroid  nodule 08/11/2009   Findings:  The thyroid  gland is within normal limits in size.  The gland is diffusely inhomogeneous. A small solid nodule is noted in the lower pole  medially on the right of 7 x 6 x 8 mm. A small solid nodule is noted inferiorly on the left of 3 x 3 x 4 mm.  IMPRESSION:  The thyroid  gland is within normal limits in size with only small solid nodules present, the largest of only 8 mm in diameter on the right.     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    Trochanteric bursitis, right hip 04/26/2020   Tubular adenoma of colon 01/28/2021   Colonoscopy screening 4 mm tubular Adenoma polyp Marilu Sol, MD [facility])   Tubular adenoma of colon 01/28/2021   Colonoscopy screening 4 mm tubular Adenoma polyp Marilu Sol, MD [facility])   Vasomotor symptoms due to menopause 04/19/2017   Vertigo 03/19/2023   Vitamin D  deficiency 05/08/2017   Vulvitis 07/22/2020   Yeast vaginitis 07/11/2019   Past Surgical History:  Procedure Laterality Date   arthroscopy Left 01/2022   with SAD and DCR, K. Supple MD   Bladder dilitation     x 3   BREAST BIOPSY Right 2011   Benign histology   CARDIOVASCULAR STRESS TEST  2000   Unremarkable per pt report   CARPOMETACARPAL JOINT ARTHROTOMY Right 2011   COLONOSCOPY     COLONOSCOPY WITH PROPOFOL  N/A 04/21/2015   Procedure: COLONOSCOPY WITH PROPOFOL ;  Surgeon: Belvie Just, MD;  Location: [facility]COPY;  Service: Endoscopy;  Laterality: N/A;   CYSTOSCOPY W/ DILATION OF BLADDER N/A    EPIDURAL BLOCK INJECTION Left 04/12/2016   Left Medial Nerve Block and Left L5 ramus block, Dr Charlie Dolores    EPIDURAL BLOCK INJECTION  03/21/2016   Left L3-4 medial branch block and Left L5 & dorsal ramus block    EPIDURAL BLOCK INJECTION N/A 10/25/2016   Charlie Dolores, MD. Lumbar medial branch block   EPIDURAL BLOCK INJECTION N/A 02/09/2017   Charlie Dolores, MD.  Bilateral L3/4 medial branch block, bilateral L5 dorsal ramus  block   EPIDURAL BLOCK INJECTION N/A 07/04/2017   Charlie Dolores, MD   EXCISION MORTON'S NEUROMA Right 05/03/2023   Procedure: EXCISION MORTON'S NEUROMA;  Surgeon: Kit Rush, MD;  Location: [facility];  Service: Orthopedics;  Laterality: Right;  general, local by surgeon 60 MIN   FECAL TRANSPLANT  04/21/2015   Procedure: FECAL TRANSPLANT;  Surgeon: Belvie Just, MD;  Location: [facility] ENDOSCOPY;  Service: Endoscopy;;   HIP ARTHROPLASTY Left    HIP ARTHROSCOPY Left 03/06/2018   Left hip arthroplasty, redo for loose hip arthroplasty. Procedure at [facility]   INJECTION HIP INTRA ARTICULAR Left 11/2015  for OA by Dr Charlie Bonner REDBIRD IMPLANT PLACEMENT  04/2021   Norleen Sharps MD ([facility])   INTERSTIM IMPLANT REVISION N/A 09/2021   Norleen Sharps MD ([facility])   LIGAMENT REPAIR Right 05/03/2023   Procedure: COLLATERAL LIGAMENT REPAIR;  Surgeon: Kit Norleen, MD;  Location: [facility];  Service: Orthopedics;  Laterality: Right;  general, local by surgeon 60 MIN   OTHER SURGICAL HISTORY Left 2016   Left L3/L4 medial nerve block and Left L5 Dorsal Ramus block Dr FABIENE Bonner   TOTAL HIP ARTHROPLASTY Left 07/25/2016   Procedure: LEFT TOTAL HIP ARTHROPLASTY ANTERIOR APPROACH;  Surgeon: Donnice Car, MD;  Location: [facility];  Service: Orthopedics;  Laterality: Left;   WEIL OSTEOTOMY Right 05/03/2023   Procedure: RIGHT 2ND WEIL OSTEOTOMY;  Surgeon: Kit Norleen, MD;  Location: [facility];  Service: Orthopedics;  Laterality: Right;  general, local by surgeon 60 MIN   Patient Active Problem List   Diagnosis Date Noted   Arthritis of carpometacarpal [facility]) joint of left thumb 09/07/2023   Low serum vitamin B12 01/10/2023   OSA (obstructive sleep apnea) 12/27/2022   Severe obstructive sleep apnea-hypopnea syndrome 12/22/2022   Hyperglycemia 12/11/2022   SOBOE (shortness of breath on exertion) 12/11/2022   Other fatigue  12/11/2022   Sleep related headaches 11/07/2022   Intractable episodic cluster headache 11/07/2022   Psychophysiological insomnia 11/07/2022   Hallux rigidus of right foot 09/25/2022   Chronic pain of right knee 05/08/2022   Arthrosis of hand 12/20/2021   History of Clostridioides difficile colitis, required fecal transplantation 12/20/2021   Meibomian gland dysfunction (MGD) of both eyes 10/17/2021   Nuclear sclerosis of both eyes 10/17/2021   Epiretinal membrane (ERM) of left eye 10/17/2021   OAB (overactive bladder) 03/02/2021   Osteoarthritis of acromioclavicular joint 02/22/2021   Hyperhidrosis, scalp, 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   Chronic low back pain 05/14/2019   Sjogren syndrome (HCC) 01/27/2019   DDD (degenerative disc disease), cervical 12/27/2018   Cervical spondylosis 11/18/2018   DDD (degenerative disc disease), lumbar 11/18/2018   Keratoconjunctivitis sicca 11/18/2018   Chronic contact dermatitis 08/06/2018   Primary osteoarthritis of left knee 06/20/2018   Insulin  resistance 07/04/2017   Irritable bowel syndrome with diarrhea 04/03/2016   Left ventricular hypertrophy, mild 02/25/2016   Allergic rhinoconjunctivitis 07/02/2015   History of colonic polyps 07/01/2015   GERD (gastroesophageal reflux disease) 12/16/2014   Fibromyalgia 10/01/2013   Mood disorder (HCC) 10/01/2013   Hyperlipidemia 10/01/2013   Pure hypercholesterolemia 05/15/2013   Chronic migraine without aura 05/15/2013   Benign essential hypertension 10/28/2012   Hypothyroidism 11/09/2011   Chronic interstitial cystitis 11/09/2011    PCP: McDiarmid, Krystal, MD REFERRING PROVIDER: Daniel Norleen, PA-C REFERRING DIAG: s/p tendon repair Z 98.890  THERAPY DIAG:  Muscle weakness (generalized)  Other abnormalities of gait and mobility  Pain in right hip  Rationale for Evaluation and Treatment: Rehabilitation  ONSET DATE: 01/23/24   SUBJECTIVE:  SUBJECTIVE STATEMENT: The patient feels like she is moving  better. She is not needing the cane in the community at this time. She is tolerating HEP well-- she got some soreness after last session. *She did not have her yoga belt, therefore we will need to look at that exercise.  MD NOTE:  In physical therapy will work on abductor strengthening and gait training. Will work on balance. Will wean off of a walker on to a cane. Will stay on a cane until is walking  without a limp.   PERTINENT HISTORY: HTN, hypercholesterolemia, fibromyalgia, migraines, arthritis, concussion PAIN:  Are you having pain? Yes: NPRS scale: 2/10 Pain location: in back, left side of her neck, and R foot (these are prior conditions) Pain description: achy and sore Aggravating factors: *does not hurt in R hip -- occasional soreness end of day when fatigued Relieving factors: none--mild discomfort  PRECAUTIONS: Other: The patient will be TDWB x 6 weeks, Abductor hip precautions x 6 weeks to the right side.  WEIGHT BEARING RESTRICTIONS: Yes initially TDWB x 6 weeks (she is beyond that per surgical date)  FALLS:  Has patient fallen in last 6 months? No  LIVING ENVIRONMENT: Lives with: lives with their spouse Lives in: House/apartment Stairs: Yes: Internal: 12-14 steps; on right going up Has following equipment at home: Single point cane  PATIENT GOALS: No pain, no cane  NEXT MD VISIT: late August  OBJECTIVE:  Note: Objective measures were completed at Evaluation unless otherwise noted.  PATIENT SURVEYS:  LEFS : 47.5%  COGNITION: Overall cognitive status: Within functional limits for tasks assessed     SENSATION: WFL  POSTURE: No Significant postural limitations  PALPATION: Mild soreness in lateral hip  LOWER EXTREMITY ROM: AROM to 90 degrees hip flexion Passive ROM Right eval Left eval  Hip flexion 95   Hip extension    Hip abduction 35   Hip adduction    Hip internal rotation 30   Hip external rotation 30   Knee flexion    Knee extension    Ankle  dorsiflexion    Ankle plantarflexion    Ankle inversion    Ankle eversion     (Blank rows = not tested)  LOWER EXTREMITY MMT: did not test due to post surgical status-- will assess as we initiate strengthening progression in PT MMT Right eval Left eval  Hip flexion    Hip extension    Hip abduction    Hip adduction    Hip internal rotation    Hip external rotation    Knee flexion    Knee extension    Ankle dorsiflexion    Ankle plantarflexion    Ankle inversion    Ankle eversion     (Blank rows = not tested)  BALANCE: R single limb x 2 seconds  L single limb x 10 seconds  GAIT: Distance walked: 150 ft Assistive device utilized: Single point cane and None Level of assistance: Modified independence Comments: 3.2 ft/sec-- patient does start off without a limp and after 100 ft has a R antalgic gait pattern *PT and patient discussed still needing the cane when outdoors and in the evening when fatigued  [facility] Adult PT Treatment:                                                DATE: 03/24/24 Therapeutic Exercise: Supine Hip abduction isometrics using black belt for strap x 5 reps Progressed to red band doing isotonic within pain free ROM Sidelying Hip hike/depression x 10 reps with R foot supported on deflated pilates ball Quadriped Rocking ant/posterior Seated--patient c/o pain R foot with standing PT provided HEP for foot -- has h/o injury without any precautions Towel scrunch Ball -- toe flexion/extension Arch lifting Ankle DF sitting-- then long sitting-- felt some hip discomfort, so discontnued Standing Hip IR/ER on stool x 15 reps Hamstring curl  x 12 reps R and L  Step ups 4 x 12 reps anteriorly R and L sides Manual Therapy: Sidelying QL -- some tightness noted Gentle stretching in quadriped Neuromuscular re-ed: Single leg standing activities with cues for hip engagement and level pelvic positioning    [facility] Adult PT Treatment:                                                 DATE: 03/19/24 Therapeutic Exercise: Hip abduction isometric 2 x 10; 5 sec hold Walking in clinic 2 x 15 ft without AD LAQ 2 x 10 @ 1.5 lbs Hip ER/IR with stool 2 x 10  Seated HS curl 2 x 10 green band  Quadruped rocking x 10   Neuromuscular re-ed: SLS with UE support focusing on level pelvis x 5 reps  Self Care: Ice for pain/soreness Wean from cane as able without limp                                                                                                                    [facility] Adult PT Treatment:                                                DATE: 03/17/24 Therapeutic Exercise: See HEP established at eval Self Care: Educated on use of cane when out of her home and in the evening when fatigued (to avoid limping/antalgic gait pattern)  PATIENT EDUCATION:  Education details: HEP Person educated: Patient Education method: Programmer, multimedia, Facilities manager, and Handouts Education comprehension: verbalized understanding and returned demonstration  HOME EXERCISE PROGRAM: Access Code: [facility] URL: [email or web address] Date: 03/19/2024 Prepared by: Lucie Meeter  Program Notes Start with 1 set of exercises this week (7/7) and ensure you tolerate them without pain before adding a 2nd set.  Exercises - Supine Heel Slide  - 1 x daily - 7 x weekly - 2 sets - 10 reps - Supine March  - 1 x daily - 7 x weekly - 2 sets - 10 reps - Supine Hip Adduction Isometric with Ball  - 1 x daily - 7 x weekly - 2 sets - 10 reps - Standing Hip Extension with Counter Support  - 2 x daily - 7 x weekly - 1 sets - 10 reps - Standing Hip Abduction with Counter Support  - 2 x daily - 7 x weekly - 1 sets - 10 reps - Standing Shoulder and Trunk Flexion at Table  - 1 x daily - 7 x weekly - 1 sets - 2 reps - 20 seconds hold - Hooklying Isometric Hip Abduction with Belt  - 1 x daily - 7 x weekly - 2 sets - 10 reps - Standing Shoulder and Trunk Flexion at Table  -  1 x daily - 7 x  weekly - 1 sets - 2 reps - 20 seconds hold  ASSESSMENT:  CLINICAL IMPRESSION: The patient is showing progress with gait mechanics. Her R hip does have some fatigue by end of session. She is tolerating progression of exercises well. She notes her R foot hurts in the morning as soon as she weight bears. She has a h/o surgical repair in the past on the R foot. PT recommended gentle exercises that will improve mobility. We also noted dec'd eccentric control of anterior tibialis during gait activities. We trialed strengthening, but patient felt in hip (with long sitting ankle DF), so deferred at this time. Will progress to patient tolerance.   EVAL: Patient is a 68 y.o. female who was seen today for physical therapy evaluation and treatment s/p R glut med repair. She is 6.5 weeks post op today and cleared for WBAT and to begin strengthening. PT initiated HEP today for gentle ROM, glut activations, and to also address R QL pain. Patient feels that cane use is flaring other pain including L 1st MCP joint, L shoulder, and R QL. PT discussed the rationale for cane use discussing goal to normalize gait and avoid an antalgic pattern, therefore, patient to use when in the community and in the evening if she is fatigued. PT to address deficits and promote return to prior functional status.   OBJECTIVE IMPAIRMENTS: Abnormal gait, decreased activity tolerance, decreased balance, decreased mobility, difficulty walking, decreased ROM, decreased strength, increased fascial restrictions, impaired flexibility, and pain.   GOALS: Goals reviewed with patient? Yes  SHORT TERM GOALS: Target date: 04/16/24  The patient will be indep with initial HEP Baseline: initiated at eval Goal status: INITIAL  2.  The patient will improve LEFS by 8% to demonstrate improved functional abilities. Baseline:  47.5% Goal status: INITIAL  3.   The patient will  demonstrate normal gait pattern with no evidence of antalgic pattern x 500  ft to d/c cane for community distances. Baseline:  Gets antalgic pattern after 100 ft in clinic Goal status: INITIAL  4.  The patient will maintain single leg standing R side x 8 seconds to demo good hip stability for functional loading. Baseline:  2 seconds, L is 10 seconds. Goal status: INITIAL  LONG TERM GOALS: Target date: 05/17/24  The patient will be indep with progression of HEP. Baseline:  initiated at eval Goal status: INITIAL  2.  The patient will improve LEFS by 15% to demonstrate improved functional abilities. Baseline: 47.5% Goal status: INITIAL  3.  The patient will report no pain in R quadratus lumborum region demonstrating reduced compensatory strategies with gait. Baseline:  Is using QL for hip hike and has pain at eval Goal status: INITIAL  4.  The patient will be able to negotiate 12 steps with one rail and reciprocal pattern mod indep. Baseline:  has done steps 1x since d/c home Goal status: INITIAL  5.  The patient will demo improved R LE strength demonstrating SLR x 10 reps. Baseline:   to assess MMT Goal status: INITIAL  PLAN:  PT FREQUENCY: 2x/week  PT DURATION: 8 weeks  PLANNED INTERVENTIONS: [id number]- PT Re-evaluation, 97750- Physical Performance Testing, 97110-Therapeutic exercises, 97530- Therapeutic activity, [id number]- Neuromuscular re-education, 97535- Self Care, [id number]- Manual therapy, [id number]- Gait training, [phone]- Aquatic Therapy, [phone]- Electrical stimulation (unattended), [phone] (1-2 muscles), [id number] (3+ muscles)- Dry Needling, Patient/Family education, Balance training, Stair training, Taping, Joint mobilization, Cryotherapy, and Moist heat  PLAN FOR NEXT SESSION:  Progress HEP for R hip strengthening, normalize gait pattern without device, work on QL lengthening due to pain, work on stairs and R LE stance control.  *anterior tibialis strengthening (due to dec'd eccentric control), treadmill working on incline for negotiating  driveway   Tye Vigo, PT 03/24/24 3:19 PM

## 2024-03-25 NOTE — Progress Notes (Unsigned)
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 dizziness, and decreased concentration per Dr Ines at [facility].    Posterior vitreous detachment of right eye 2014   Premature menopause 12/20/2021   Primary osteoarthritis of left knee 06/20/2018   Psychophysiological insomnia 11/07/2022   Pure hypercholesterolemia 05/15/2013   Rectal abscess 05/10/2021   S/P left THA, AA 07/25/2016   S/P revision of total hip 04/10/2018   S/P tendon repair 01/23/2024   Sacroiliac inflammation (HCC) 08/18/2021  Severe obstructive sleep apnea-hypopnea syndrome 12/22/2022   Shingles    Shortness of breath dyspnea    with exertion   Sicca syndrome (HCC) 11/18/2018   (+) ANA   Sjogren syndrome (HCC) 01/27/2019   Sjogren's syndrome (HCC) 01/27/2019   Sleep walking and eating 03/06/2017   Snoring 03/06/2017   Spondylosis of lumbar region without myelopathy or radiculopathy 10/30/2011   Status post total hip replacement, left 09/20/2017   TFC (triangular fibrocartilage complex) injury 02/25/2019   Thyroid  nodule 08/11/2009   Findings: The thyroid  gland is within normal limits in size.  The gland is diffusely inhomogeneous. A small solid nodule is noted in the lower pole  medially on the right of 7 x 6 x 8 mm. A  small solid nodule is noted inferiorly on the left of 3 x 3 x 4 mm.  IMPRESSION:  The thyroid  gland is within normal limits in size with only small solid nodules present, the largest of only 8 mm in diameter on the right.     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    Trochanteric bursitis, right hip 04/26/2020   Tubular adenoma of colon 01/28/2021   Colonoscopy screening 4 mm tubular Adenoma polyp Marilu Sol, MD [facility])   Tubular adenoma of colon 01/28/2021   Colonoscopy screening 4 mm tubular Adenoma polyp Marilu Sol, MD [facility])   Vasomotor symptoms due to menopause 04/19/2017   Vertigo 03/19/2023   Vitamin D  deficiency 05/08/2017   Vulvitis 07/22/2020   Yeast vaginitis 07/11/2019    PAST SURGICAL HISTORY: Past Surgical History:  Procedure Laterality Date   arthroscopy Left 01/2022   with SAD and DCR, K. Supple MD   Bladder dilitation     x 3   BREAST BIOPSY Right 2011   Benign histology   CARDIOVASCULAR STRESS TEST  2000   Unremarkable per pt report   CARPOMETACARPAL JOINT ARTHROTOMY Right 2011   COLONOSCOPY     COLONOSCOPY WITH PROPOFOL  N/A 04/21/2015   Procedure: COLONOSCOPY WITH PROPOFOL ;  Surgeon: Belvie Just, MD;  Location: [facility];  Service: Endoscopy;  Laterality: N/A;   CYSTOSCOPY W/ DILATION OF BLADDER N/A    EPIDURAL BLOCK INJECTION Left 04/12/2016   Left Medial Nerve Block and Left L5 ramus block, Dr Charlie Dolores    EPIDURAL BLOCK INJECTION  03/21/2016   Left L3-4 medial branch block and Left L5 & dorsal ramus block    EPIDURAL BLOCK INJECTION N/A 10/25/2016   Charlie Dolores, MD. Lumbar medial branch block   EPIDURAL BLOCK INJECTION N/A 02/09/2017   Charlie Dolores, MD.  Bilateral L3/4 medial branch block, bilateral L5 dorsal ramus block   EPIDURAL BLOCK INJECTION N/A 07/04/2017   Charlie Dolores, MD   EXCISION MORTON'S NEUROMA Right 05/03/2023   Procedure: EXCISION MORTON'S  NEUROMA;  Surgeon: Kit Rush, MD;  Location: [facility];  Service: Orthopedics;  Laterality: Right;  general, local by surgeon 60 MIN   FECAL TRANSPLANT  04/21/2015   Procedure: FECAL TRANSPLANT;  Surgeon: Belvie Just, MD;  Location: [facility] ENDOSCOPY;  Service: Endoscopy;;   HIP ARTHROPLASTY Left    HIP ARTHROSCOPY Left 03/06/2018   Left hip arthroplasty, redo for loose hip arthroplasty. Procedure at [facility] hospital   INJECTION HIP INTRA ARTICULAR Left 11/2015   for OA by Dr Charlie Dolores REDBIRD IMPLANT PLACEMENT  04/2021   Rush Sharps MD ([facility])   INTERSTIM IMPLANT REVISION  N/A 09/2021   Norleen Sharps MD ([facility])   LIGAMENT REPAIR Right 05/03/2023   Procedure: COLLATERAL LIGAMENT REPAIR;  Surgeon: Kit Norleen, MD;  Location: [facility];  Service: Orthopedics;  Laterality: Right;  general, local by surgeon 60 MIN   OTHER SURGICAL HISTORY Left 2016   Left L3/L4 medial nerve block and Left L5 Dorsal Ramus block Dr FABIENE Dolores   TOTAL HIP ARTHROPLASTY Left 07/25/2016   Procedure: LEFT TOTAL HIP ARTHROPLASTY ANTERIOR APPROACH;  Surgeon: Donnice Car, MD;  Location: [facility];  Service: Orthopedics;  Laterality: Left;   WEIL OSTEOTOMY Right 05/03/2023   Procedure: RIGHT 2ND WEIL OSTEOTOMY;  Surgeon: Kit Norleen, MD;  Location: [facility];  Service: Orthopedics;  Laterality: Right;  general, local by surgeon 60 MIN    FAMILY HISTORY: Family History  Problem Relation Age of Onset   Alzheimer's disease Mother    Hyperlipidemia Mother    Hypertension Mother    Osteoporosis Mother    Parkinson's disease Mother    Migraines Sister    Allergies Sister    Hypertension Sister    Hyperlipidemia Brother    Cardiomyopathy Brother    Diabetes type II Brother    Kidney disease Brother    Asthma Brother    Heart disease Brother    Hypertension Brother    Hyperlipidemia Brother    Kidney disease Brother    Heart  disease Brother    Heart disease Father    Hyperlipidemia Father    Hypertension Father    Aortic aneurysm Father    Early death Father 76   Diabetes type II Other    Breast cancer Maternal Aunt     SOCIAL HISTORY: Social History   Socioeconomic History   Marital status: Married    Spouse name: Nira Visscher   Number of children: 1   Years of education: 16   Highest education level: Bachelor's degree (e.g., BA, AB, BS)  Occupational History   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   Drug use: No   Sexual activity: Yes    Partners: Male    Birth control/protection: None  Other Topics Concern   Not on file  Social History Narrative   Prior PCP With [facility] [place], [place].   Married, lives with Dora, (b. 1954)   Son in [place]. (2) grandchildren.   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    Wears seatbelt usually   No religious beliefs affecting healthcare   No difficulty taking medications as directed.       Home has working smoke alarm   No home throw rugs   Does not have nonslip bathtub / shower    Has railings on all stairs   Home is free from Clutter.    Right-handed.      Best number to reach patient [phone] (M) as of 09/21/2021.    No regular exercise of 3  times a week for 30 minutes at a time      Mardene Lessig has Advanced Directive and a Sports administrator is husband, Eldoris Beiser [phone]      Caffeine: 2-3 cups coffee per day   Social Drivers of Manufacturing engineer Strain: Low Risk  (01/10/2024)   Overall Financial Resource Strain (CARDIA)    Difficulty of Paying Living Expenses: Not hard at all  Food Insecurity: Low Risk  (01/23/2024)   Received from [facility]   Hunger Vital Sign    Within the past 12 months, you worried that your food would run out before you got money to buy more: Never true    Within the past 12 months, the food you bought just didn't last and you didn't have money to get more. : Never true  Transportation Needs: No Transportation Needs (01/23/2024)   Received from [facility]    In the past 12 months, has lack of reliable transportation kept you from medical appointments, meetings, work or from getting things needed for daily living? : No  Physical Activity: Unknown (01/10/2024)   Exercise Vital Sign    Days of Exercise per Week: 0 days    Minutes of Exercise per Session: Not on file  Stress: No Stress Concern Present (01/10/2024)   [employer] of Occupational Health - Occupational Stress Questionnaire    Feeling of Stress : Only a little  Social Connections: Moderately Integrated (01/10/2024)   Social Connection and Isolation Panel    Frequency of Communication with Friends and Family: Once a week    Frequency of Social Gatherings with Friends and Family: Twice a week    Attends Religious Services: Never    Database administrator or Organizations: Yes    Attends Engineer, structural: More than 4 times per year    Marital Status: Married  Catering manager Violence: Not At Risk (12/07/2022)   Humiliation, Afraid, Rape, and Kick questionnaire    Fear of Current or Ex-Partner: No    Emotionally Abused: No    Physically Abused: No    Sexually Abused: No      PHYSICAL EXAM  There were no vitals filed for this visit.      There is no height or weight on file to calculate BMI.  Repeat BP 140/88 manually in office.   Generalized: Well developed, in no acute distress   Cardiology: normal rate and rhythm, no murmur noted Respiratory: clear to auscultation bilaterally  Neurological examination  Mentation: Alert oriented to time, place, history taking. Follows all commands speech and language fluent Cranial nerve II-XII: Pupils were equal round reactive to light. Extraocular movements were full, visual field were full  Motor: The motor testing reveals 5 over 5 strength of all 4 extremities. Good symmetric motor tone is noted throughout.  Gait and station: Gait is normal.    DIAGNOSTIC DATA (LABS, IMAGING, TESTING) - I reviewed patient records, labs, notes, testing and imaging myself where available.     01/14/2024   11:14 AM 02/25/2016   12:53 PM  MMSE - Mini Mental State Exam  Not completed: Unable to complete   Orientation to time  --   Orientation to time comments  Testing: Ms Troy named 18 different animal types in 50 seconds WNL      Data saved with a previous flowsheet row definition     Lab Results  Component Value Date   WBC  6.4 08/04/2020   HGB 13.5 08/04/2020   HCT 41 08/04/2020   MCV 95.0 07/05/2019   PLT 262 08/04/2020      Component Value Date/Time   NA 144 09/10/2023 0731   K 4.2 09/10/2023 0731   CL 107 (H) 09/10/2023 0731   CO2 22 09/10/2023 0731   GLUCOSE 79 09/10/2023 0731   GLUCOSE 93 07/05/2019 1629   BUN 20 09/10/2023 0731   CREATININE 0.71 09/10/2023 0731   CREATININE 0.74 02/24/2016 1158   CALCIUM  9.3 09/10/2023 0731   PROT 6.6 09/10/2023 0731   ALBUMIN 4.3 09/10/2023 0731   AST 21 09/10/2023 0731   ALT 21 09/10/2023 0731   ALKPHOS 84 09/10/2023 0731   BILITOT 0.3 09/10/2023 0731   GFRNONAA 65 07/24/2019 1116   GFRNONAA 88 02/24/2016 1158   GFRAA 75 07/24/2019 1116   GFRAA >89 02/24/2016 1158   Lab Results  Component Value Date   CHOL 166 09/10/2023   HDL 51 09/10/2023   LDLCALC 97 09/10/2023   TRIG 95 09/10/2023   CHOLHDL 3.3 10/11/2021   Lab Results  Component Value Date   HGBA1C 5.2 09/10/2023    Lab Results  Component Value Date   VITAMINB12 385 09/10/2023   Lab Results  Component Value Date   TSH 2.290 10/18/2023       ASSESSMENT AND PLAN 68 y.o. year old female  has a past medical history of Abdominal bloating (05/10/2021), Abdominal discomfort, generalized (05/07/2016), Acquired hallux rigidus of right foot (09/15/2022), Acute gastritis (11/19/2020), Allergic rhinoconjunctivitis (07/02/2015), Anal fissure (11/19/2020), Anterior to posterior tear of superior glenoid labrum of left shoulder (02/22/2021), Arthritis, Arthritis of carpometacarpal (CMC) joint of left thumb (09/07/2023), Aseptic loosening of prosthetic hip, initial encounter (HCC) (11/29/2017), Benign essential hypertension (10/28/2012), Benign positional vertigo (04/2015), Bruxism (teeth grinding), Cervical spondylosis (11/18/2018), Chronic contact dermatitis (08/06/2018), Chronic interstitial cystitis (11/09/2011), Chronic low back pain (05/14/2019), Chronic migraine (02/25/2016), Chronic migraine without aura (05/15/2013), Chronic tonsillitis (01/27/2019), DDD (degenerative disc disease), cervical (11/18/2018), DDD (degenerative disc disease), lumbar, Depression, Disc displacement, lumbar, Dysphagia (10/11/2021), Dysrhythmia, Epiretinal membrane (ERM) of left eye (10/17/2021), Episodic cluster headache, not intractable (03/06/2017), Essential hypertension (05/15/2013), Family history of adverse reaction to anesthesia, Family history of premature CAD (05/15/2013), Fibromyalgia (10/01/2013), Fibromyalgia syndrome (07/01/2015), GERD (gastroesophageal reflux disease) (12/16/2014), H/O seasonal allergies, Hallux rigidus of right foot (09/25/2022), Hammer toe, Hashimoto's thyroiditis, Hearing loss of both ears (07/27/2015), History of Clostridioides difficile colitis, required fecal transplantation (12/20/2021), History of Clostridium difficile colitis (07/01/2015), History of colonic polyps, History of left hip replacement  (09/20/2017), History of revision of total hip arthroplasty (04/10/2018), bad fall (02/2015), Hyperhidrosis, scalp, 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), Hyperlipidemia (1998), Hypokalemia due to excessive gastrointestinal loss of potassium (07/28/2019), Hypothyroidism, Iliopsoas bursitis of left hip (05/08/2022), Impairment of balance (02/2015), Injury of triangular fibrocartilage complex (TFCC) (02/25/2019), Injury of triangular fibrocartilage complex of left wrist (02/25/2019), Insulin  resistance (07/04/2017), Internal hemorrhoid (01/28/2021), Interstitial cystitis, Irritable bowel syndrome with diarrhea (04/03/2016), Keratoconjunctivitis sicca (11/18/2018), Left ventricular hypertrophy, mild (02/25/2016), 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HCC) (03/10/2018), Low serum vitamin B12 (01/10/2023), Lumbar facet joint pain, Meibomian gland dysfunction (MGD) of both eyes (10/17/2021), 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), Metatarsalgia of both feet (07/31/2022), Mood disorder (HCC), Morbid obesity (HCC) (03/06/2017), Morton's neuroma of right foot (09/15/2022), Musculoskeletal neck pain (07/14/2015), Nocturnal hypoxemia (03/06/2017), Normal coronary arteries (05/14/2014), Nuclear sclerosis of both eyes (10/17/2021), OAB (overactive bladder) (03/02/2021), Obesity (BMI 30.0-34.9) (01/29/2017), Odynophagia (12/20/2021), OSA (obstructive sleep apnea) (12/27/2022), Osteoarthritis of acromioclavicular joint (02/22/2021), Osteoarthritis of  left hip (11/01/2015), 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, lumbar region (10/30/2011), Other insomnia (11/09/2016), Overweight, Pain in joint of left shoulder (11/09/2016), Pain in joint, multiple sites (11/18/2018), Palpitations (05/15/2013), Perianal candidiasis (12/20/2021), 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), Perirectal cyst (05/07/2016), Plantar fasciitis of right foot (11/10/2022), Poison ivy dermatitis (03/24/2021), PONV (postoperative nausea and vomiting),  Positive ANA (antinuclear antibody) (11/18/2018), Post concussion syndrome (06/06/2015), Post concussive syndrome (07/14/2015), Posterior vitreous detachment of right eye (2014), Premature menopause (12/20/2021), Primary osteoarthritis of left knee (06/20/2018), Psychophysiological insomnia (11/07/2022), Pure hypercholesterolemia (05/15/2013), Rectal abscess (05/10/2021), S/P left THA, 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), S/P revision of total hip (04/10/2018), S/P tendon repair (01/23/2024), Sacroiliac inflammation (HCC) (08/18/2021), Severe obstructive sleep apnea-hypopnea syndrome (12/22/2022), Shingles, Shortness of breath dyspnea, Sicca syndrome (HCC) (11/18/2018), Sjogren syndrome (HCC) (01/27/2019), Sjogren's syndrome (HCC) (01/27/2019), Sleep walking and eating (03/06/2017), Snoring (03/06/2017), Spondylosis of 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 here with    No diagnosis found.   Delorice reports migraines continue fairly consistently. She would like to switch Emgality  to Vyepti. I will submit orders for 100mg  IV every 12 weeks. May increase dose to 300mg  q12w if needed. She will continue rizatriptan  for abortive therapy. She has an active migraine, today. I will give her Toradol  60mg  IM in the office. Advised to avoid Nsaids for 24 hours. She was encouraged to follow up closely with Dr Bonner. Neck etiology could very well contribute to headaches. Compliance report show optimal daily but sub optimal four hour compliance. I have  encouraged her to focus on using CPAP daily for at least 4 hours. She will continue healthy lifestyle habits. She will follow-up with me in 6 months, sooner if needed.  She verbalizes understanding and agreement with this plan.   No orders of the defined types were placed in this encounter.     No orders of the defined types were placed in this encounter.     [name], FNP-C 03/25/2024, 11:25 AM [name] [address], [address] [place], [place] [address] [phone]

## 2024-03-25 NOTE — Patient Instructions (Signed)
 Below is our plan:  We will continue Vyepti 300mg  IV every 3 months and rizatriptan  as needed. I will place orders to repeat HST.   Please continue using your CPAP regularly. While your insurance requires that you use CPAP at least 4 hours each night on 70% of the nights, I recommend, that you not skip any nights and use it throughout the night if you can. Getting used to CPAP and staying with the treatment long term does take time and patience and discipline. Untreated obstructive sleep apnea when it is moderate to severe can have an adverse impact on cardiovascular health and raise her risk for heart disease, arrhythmias, hypertension, congestive heart failure, stroke and diabetes. Untreated obstructive sleep apnea causes sleep disruption, nonrestorative sleep, and sleep deprivation. This can have an impact on your day to day functioning and cause daytime sleepiness and impairment of cognitive function, memory loss, mood disturbance, and problems focussing. Using CPAP regularly can improve these symptoms.  Please make sure you are staying well hydrated. I recommend 50-60 ounces daily. Well balanced diet and regular exercise encouraged. Consistent sleep schedule with 6-8 hours recommended.   Please continue follow up with care team as directed.   Follow up with me in 1 year   You may receive a survey regarding today's visit. I encourage you to leave honest feed back as I do use this information to improve patient care. Thank you for seeing me today!

## 2024-03-26 ENCOUNTER — Encounter (INDEPENDENT_AMBULATORY_CARE_PROVIDER_SITE_OTHER): Payer: Self-pay | Admitting: Family Medicine

## 2024-03-26 ENCOUNTER — Encounter: Payer: Self-pay | Admitting: Family Medicine

## 2024-03-26 ENCOUNTER — Ambulatory Visit (INDEPENDENT_AMBULATORY_CARE_PROVIDER_SITE_OTHER): Payer: Medicare Other | Admitting: Family Medicine

## 2024-03-26 VITALS — BP 115/75 | HR 87 | Ht 66.0 in | Wt 165.5 lb

## 2024-03-26 DIAGNOSIS — G43709 Chronic migraine without aura, not intractable, without status migrainosus: Secondary | ICD-10-CM | POA: Diagnosis not present

## 2024-03-26 DIAGNOSIS — G4733 Obstructive sleep apnea (adult) (pediatric): Secondary | ICD-10-CM

## 2024-03-26 NOTE — Therapy (Unsigned)
 OUTPATIENT PHYSICAL THERAPY LOWER EXTREMITY TREATMENT   Patient Name: Molly Giles MRN: [id number] DOB:21-Apr-1956, 68 y.o., female Today's Date: 03/27/2024  END OF SESSION:  PT End of Session - 03/27/24 1450     Visit Number 4    Number of Visits 16    Date for PT Re-Evaluation 04/16/24    Authorization Type medicare and BCBS supplemental    Progress Note Due on Visit 10    PT Start Time 1450    PT Stop Time 1530    PT Time Calculation (min) 40 min    Activity Tolerance Patient tolerated treatment well    Behavior During Therapy [facility] for tasks assessed/performed          Past Medical History:  Diagnosis Date   Abdominal bloating 05/10/2021   Abdominal discomfort, generalized 05/07/2016   Acquired hallux rigidus of right foot 09/15/2022   Acute gastritis 11/19/2020   Allergic rhinoconjunctivitis 07/02/2015   Anal fissure 11/19/2020   Anterior to posterior tear of superior glenoid labrum of left shoulder 02/22/2021   Arthritis    Arthritis of carpometacarpal (CMC) joint of left thumb 09/07/2023   Aseptic loosening of prosthetic hip, initial encounter (HCC) 11/29/2017   Benign essential hypertension 10/28/2012   Benign positional vertigo 04/2015   Responded well to Vestibular Rehab   Bruxism (teeth grinding)    Cervical spondylosis 11/18/2018   Chronic contact dermatitis 08/06/2018   Per allergist Dr Almarie Scala.    Chronic interstitial cystitis 11/09/2011   Managed by [facility] urology branch in [place] hydrodistention 5 years ago with Dr Claudene in [place], has relief of symptoms until now     Chronic low back pain 05/14/2019   Chronic migraine 02/25/2016   Per Dr Ines review in notes from [facility] from September 2015. Showed total headache days last month 18. Severe headache days 7 days. Moderate headache days 5 days. Mild headache days last month sick days. Days without headache last month 10 days. Symptoms associated with photophobia, phonophobia,  osmophobia, neck pain, dizziness, jaw pain, nasal congestion, vision disturbances, tingling and numbness, weakness and worsening with activity. Each headache attack last 3 hours depending on treatment in severity. Left side, the right side, easier side, the frontal area in the back of the head. Characterized as throbbing, pressure, tightness, squeezing, stabbing and burning    Chronic migraine without aura 05/15/2013    Dr Ines [facility]   Chronic tonsillitis 01/27/2019   DDD (degenerative disc disease), cervical 11/18/2018   DDD (degenerative disc disease), lumbar    Depression    Disc displacement, lumbar    Dysphagia 10/11/2021   Dysrhythmia    seen by dr Blanca- not a problem since she has been on Bystolic    Epiretinal membrane (ERM) of left eye 10/17/2021   Periodic ophthalmological monitoring     Episodic cluster headache, not intractable 03/06/2017   Essential hypertension 05/15/2013   Family history of adverse reaction to anesthesia    Brother- N/V   Family history of premature CAD 05/15/2013   Fibromyalgia 10/01/2013   Management by Dr GORMAN. Devashwar (Rheum)     Fibromyalgia syndrome 07/01/2015   Management by Dr GORMAN. Devashwar ([facility]he[facility])    GERD (gastroesophageal reflux disease) 12/16/2014   H/O seasonal allergies    Hallux rigidus of right foot 09/25/2022   Hammer toe    Left great toe   Hashimoto's thyroiditis    Per patient, diagnosed by Dr. Sharyne Pacini   Hearing loss of both  ears 07/27/2015   mild to borderline moderate low frequency hearing loss improving to within normal limits bilaterally on audiology testing at [facility] in November 2016.     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   Hyperlipidemia 1998   Hypokalemia due to excessive gastrointestinal loss of potassium 07/28/2019   Hypothyroidism    Iliopsoas bursitis of left hip 05/08/2022   Impairment of balance 02/2015   Consequent of postconcussive syndrome   Injury of triangular fibrocartilage complex (TFCC) 02/25/2019   Injury of triangular fibrocartilage complex of left wrist 02/25/2019   Dx 02/25/19 Prentice Pagan IV MD ([facility])   Insulin  resistance 07/04/2017   Internal hemorrhoid 01/28/2021   Internal hemorrhoid seen on colonoscopy 10/2020 REGINOLD Sol MD [facility])   Interstitial cystitis    Irritable bowel syndrome with diarrhea 04/03/2016   Keratoconjunctivitis sicca 11/18/2018   (+) ANA     Left ventricular hypertrophy, mild 02/25/2016   ECHOcardiogram report 06/17/15 showing EF55-60%, mild LVH and G1DD    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HCC) 03/10/2018   [facility]   Low serum vitamin B12 01/10/2023   Lumbar facet joint pain    Meibomian gland dysfunction (MGD) of both eyes 10/17/2021   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  Added automatically from request for surgery [id number]     Formatting of this note might be different from the original.  Added automatically from request for surgery [id number]     Obesity (BMI 30.0-34.9) 01/29/2017   Odynophagia 12/20/2021   OSA (obstructive sleep apnea) 12/27/2022   Osteoarthritis of acromioclavicular joint 02/22/2021    Osteoarthritis of left hip 11/01/2015   MRI order by Dr Ernie (ortho) 10/2015 showed significant arthritis of left hip joint with cystic changes in femoral head c/w osteoarthritis   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, lumbar region 10/30/2011   Other insomnia 11/09/2016   Overweight    Pain in joint of left shoulder 11/09/2016   Pain in joint, multiple sites 11/18/2018   Palpitations 05/15/2013   [facility] manages   Perianal candidiasis 12/20/2021   Periodic limb movement sleep disorder 03/28/2017   Perirectal cyst 05/07/2016   Plantar fasciitis of right foot 11/10/2022   Poison ivy dermatitis 03/24/2021   PONV (postoperative nausea and vomiting)    Positive ANA (antinuclear antibody) 11/18/2018   Post concussion syndrome 06/06/2015   Post concussive syndrome 07/14/2015   Ms Fetch's post-concussive syndrome manifesting in vertigo and headache, mood changes, poor balance, dizziness, and decreased concentration per Dr Ines at [facility].    Posterior vitreous detachment of right eye 2014   Premature menopause 12/20/2021   Primary osteoarthritis of left knee 06/20/2018   Psychophysiological insomnia 11/07/2022   Pure hypercholesterolemia 05/15/2013  Rectal abscess 05/10/2021   S/P left THA, AA 07/25/2016   S/P revision of total hip 04/10/2018   S/P tendon repair 01/23/2024   Sacroiliac inflammation (HCC) 08/18/2021   Severe obstructive sleep apnea-hypopnea syndrome 12/22/2022   Shingles    Shortness of breath dyspnea    with exertion   Sicca syndrome (HCC) 11/18/2018   (+) ANA   Sjogren syndrome (HCC) 01/27/2019   Sjogren's syndrome (HCC) 01/27/2019   Sleep walking and eating 03/06/2017   Snoring 03/06/2017   Spondylosis of lumbar region without myelopathy or radiculopathy 10/30/2011   Status post total hip replacement, left 09/20/2017   TFC (triangular fibrocartilage complex) injury 02/25/2019   Thyroid  nodule 08/11/2009   Findings:  The thyroid  gland is within normal limits in size.  The gland is diffusely inhomogeneous. A small solid nodule is noted in the lower pole  medially on the right of 7 x 6 x 8 mm. A small solid nodule is noted inferiorly on the left of 3 x 3 x 4 mm.  IMPRESSION:  The thyroid  gland is within normal limits in size with only small solid nodules present, the largest of only 8 mm in diameter on the right.     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    Trochanteric bursitis, right hip 04/26/2020   Tubular adenoma of colon 01/28/2021   Colonoscopy screening 4 mm tubular Adenoma polyp Marilu Sol, MD [facility])   Tubular adenoma of colon 01/28/2021   Colonoscopy screening 4 mm tubular Adenoma polyp Marilu Sol, MD [facility])   Vasomotor symptoms due to menopause 04/19/2017   Vertigo 03/19/2023   Vitamin D  deficiency 05/08/2017   Vulvitis 07/22/2020   Yeast vaginitis 07/11/2019   Past Surgical History:  Procedure Laterality Date   arthroscopy Left 01/2022   with SAD and DCR, K. Supple MD   Bladder dilitation     x 3   BREAST BIOPSY Right 2011   Benign histology   CARDIOVASCULAR STRESS TEST  2000   Unremarkable per pt report   CARPOMETACARPAL JOINT ARTHROTOMY Right 2011   COLONOSCOPY     COLONOSCOPY WITH PROPOFOL  N/A 04/21/2015   Procedure: COLONOSCOPY WITH PROPOFOL ;  Surgeon: Belvie Just, MD;  Location: [facility] ENDOSCOPY;  Service: Endoscopy;  Laterality: N/A;   CYSTOSCOPY W/ DILATION OF BLADDER N/A    EPIDURAL BLOCK INJECTION Left 04/12/2016   Left Medial Nerve Block and Left L5 ramus block, Dr Charlie Dolores    EPIDURAL BLOCK INJECTION  03/21/2016   Left L3-4 medial branch block and Left L5 & dorsal ramus block    EPIDURAL BLOCK INJECTION N/A 10/25/2016   Charlie Dolores, MD. Lumbar medial branch block   EPIDURAL BLOCK INJECTION N/A 02/09/2017   Charlie Dolores, MD.  Bilateral L3/4 medial branch block, bilateral L5 dorsal ramus  block   EPIDURAL BLOCK INJECTION N/A 07/04/2017   Charlie Dolores, MD   EXCISION MORTON'S NEUROMA Right 05/03/2023   Procedure: EXCISION MORTON'S NEUROMA;  Surgeon: Kit Rush, MD;  Location: [facility];  Service: Orthopedics;  Laterality: Right;  general, local by surgeon 60 MIN   FECAL TRANSPLANT  04/21/2015   Procedure: FECAL TRANSPLANT;  Surgeon: Belvie Just, MD;  Location: [facility] ENDOSCOPY;  Service: Endoscopy;;   HIP ADDUCTOR TENOTOMY  01/2024   HIP ARTHROPLASTY Left    HIP ARTHROSCOPY Left 03/06/2018   Left hip arthroplasty, redo for loose hip arthroplasty. Procedure at [facility]  INJECTION HIP INTRA ARTICULAR Left 11/2015   for OA by Dr Charlie Bonner REDBIRD IMPLANT PLACEMENT  04/2021   Norleen Sharps MD ([facility])   INTERSTIM IMPLANT REVISION N/A 09/2021   Norleen Sharps MD ([facility])   LIGAMENT REPAIR Right 05/03/2023   Procedure: COLLATERAL LIGAMENT REPAIR;  Surgeon: Kit Norleen, MD;  Location: [facility];  Service: Orthopedics;  Laterality: Right;  general, local by surgeon 60 MIN   OTHER SURGICAL HISTORY Left 2016   Left L3/L4 medial nerve block and Left L5 Dorsal Ramus block Dr FABIENE Bonner   TOTAL HIP ARTHROPLASTY Left 07/25/2016   Procedure: LEFT TOTAL HIP ARTHROPLASTY ANTERIOR APPROACH;  Surgeon: Donnice Car, MD;  Location: [facility];  Service: Orthopedics;  Laterality: Left;   WEIL OSTEOTOMY Right 05/03/2023   Procedure: RIGHT 2ND WEIL OSTEOTOMY;  Surgeon: Kit Norleen, MD;  Location: [facility];  Service: Orthopedics;  Laterality: Right;  general, local by surgeon 60 MIN   Patient Active Problem List   Diagnosis Date Noted   Chronic pain of left upper extremity 02/19/2024   Arthritis of carpometacarpal [facility]) joint of left thumb 09/07/2023   Low serum vitamin B12 01/10/2023   OSA (obstructive sleep apnea) 12/27/2022   Severe obstructive sleep apnea-hypopnea syndrome 12/22/2022   Hyperglycemia  12/11/2022   SOBOE (shortness of breath on exertion) 12/11/2022   Other fatigue 12/11/2022   Sleep related headaches 11/07/2022   Intractable episodic cluster headache 11/07/2022   Psychophysiological insomnia 11/07/2022   Hallux rigidus of right foot 09/25/2022   Chronic pain of right knee 05/08/2022   Arthrosis of hand 12/20/2021   History of Clostridioides difficile colitis, required fecal transplantation 12/20/2021   Meibomian gland dysfunction (MGD) of both eyes 10/17/2021   Nuclear sclerosis of both eyes 10/17/2021   Epiretinal membrane (ERM) of left eye 10/17/2021   OAB (overactive bladder) 03/02/2021   Osteoarthritis of acromioclavicular joint 02/22/2021   Hyperhidrosis, scalp, 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   Chronic low back pain 05/14/2019   Sjogren syndrome (HCC) 01/27/2019   DDD (degenerative disc disease), cervical 12/27/2018   Cervical spondylosis 11/18/2018   DDD (degenerative disc disease), lumbar 11/18/2018   Keratoconjunctivitis sicca 11/18/2018   Chronic contact dermatitis 08/06/2018   Primary osteoarthritis of left knee 06/20/2018   Insulin  resistance 07/04/2017   Irritable bowel syndrome with diarrhea 04/03/2016   Left ventricular hypertrophy, mild 02/25/2016   Allergic rhinoconjunctivitis 07/02/2015   History of colonic polyps 07/01/2015   GERD (gastroesophageal reflux disease) 12/16/2014   Fibromyalgia 10/01/2013   Mood disorder (HCC) 10/01/2013   Hyperlipidemia 10/01/2013   Pure hypercholesterolemia 05/15/2013   Chronic migraine without aura 05/15/2013   Benign essential hypertension 10/28/2012   Hypothyroidism 11/09/2011   Chronic interstitial cystitis 11/09/2011    PCP: McDiarmid, Krystal, MD REFERRING PROVIDER: Daniel Norleen, PA-C REFERRING DIAG: s/p tendon repair Z 98.890  THERAPY DIAG:  Muscle weakness (generalized)  Other abnormalities of gait and mobility  Pain in right hip  Rationale for Evaluation and Treatment: Rehabilitation  ONSET DATE: 01/23/24    SUBJECTIVE:  SUBJECTIVE STATEMENT: The patient reports she was sore in the R lateral hip after last session.  The patient did some R foot exercise after last session and woke with foot soreness on Wednesday. We discussed prior R foot history-- she has had surgery to remove morton's neuroma and osteotomy. She has no precautions at this time for the foot.    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 Will work on balance. Will wean off of a walker on to a cane. Will stay on a cane until is walking without a limp.   PERTINENT HISTORY: HTN, hypercholesterolemia, fibromyalgia, migraines, arthritis, concussion PAIN:  Are you having pain? Yes: NPRS scale: mild soreness in R quad lumborum, and L shoulder Pain location: in back, left side of her neck, and R foot (these are prior conditions) Pain description: achy and sore Aggravating factors: *does not hurt in R hip -- occasional soreness end of day when fatigued Relieving factors: none--mild discomfort  PRECAUTIONS: Other: The patient will be TDWB x 6 weeks, Abductor hip precautions x 6 weeks to the right side.  WEIGHT BEARING RESTRICTIONS: Yes initially TDWB x 6 weeks (she is beyond that per surgical date)  FALLS:  Has patient fallen in last 6 months? No  LIVING ENVIRONMENT: Lives with: lives with their spouse Lives in: House/apartment Stairs: Yes: Internal: 12-14 steps; on right going up Has following equipment at home: Single point cane  PATIENT GOALS: No pain, no cane  NEXT MD VISIT: late August  OBJECTIVE:  Note: Objective measures were completed at Evaluation unless otherwise noted.  PATIENT SURVEYS:  LEFS : 47.5%  COGNITION: Overall cognitive status: Within functional limits for tasks assessed     SENSATION: WFL  POSTURE: No Significant postural limitations  PALPATION: Mild soreness in lateral hip  LOWER EXTREMITY ROM: AROM to 90 degrees hip flexion Passive ROM Right eval  Left eval Right 03/27/24  Hip flexion 95  In quadriped 120 deg  Hip extension     Hip abduction 35    Hip adduction     Hip internal rotation 30    Hip external rotation 30    Knee flexion     Knee extension     Ankle dorsiflexion     Ankle plantarflexion     Ankle inversion     Ankle eversion      (Blank rows = not tested)  LOWER EXTREMITY MMT: did not test due to post surgical status-- will assess as we initiate strengthening progression in PT MMT Right eval Left eval  Hip flexion    Hip extension    Hip abduction    Hip adduction    Hip internal rotation    Hip external rotation    Knee flexion    Knee extension    Ankle dorsiflexion    Ankle plantarflexion    Ankle inversion    Ankle eversion     (Blank rows = not tested)  BALANCE: R single limb x 2 seconds  L single limb x 10 seconds  GAIT: Distance walked: 150 ft Assistive device utilized: Single point cane and None Level of assistance: Modified independence Comments: 3.2 ft/sec-- patient does start off without a limp and after 100 ft has a R antalgic gait pattern *PT and patient discussed still needing the cane when outdoors and in the evening when fatigued  [facility] Adult PT Treatment:                                                DATE: 03/27/24 Therapeutic Exercise: Supine Hip extension isometrics into a yoga ball x 10 reps with 5 second holds Hooklying isometric abduction x 5 second holds x 6 reps Using deflated yoga ball to roll out sacrum in  supine Quadriped Rocking anterior/posterior Sidelying  Hip abduction x 10 reps SLR Neuromuscular re-ed: Single leg standing R and L-- PT corrected L hip adduction and IR during R leg stance Gait: Ambulation x 240 ft without antalgic hip pattern Patient does continue with narrow base of support-- we discussed-- if she abducts legs to 2-4 width during gait she gets R QL discomfort Discussed her orthotics, shoewear   [facility] Adult PT Treatment:                                                 DATE: 03/24/24 Therapeutic Exercise: Supine Hip abduction isometrics using black belt for strap x 5 reps Progressed to red band doing isotonic within pain free ROM Sidelying Hip hike/depression x 10 reps with R foot supported on deflated pilates ball Quadriped Rocking ant/posterior Seated--patient c/o pain R foot with standing PT provided HEP for foot -- has h/o injury without any precautions Towel scrunch Ball -- toe flexion/extension Arch lifting Ankle DF sitting-- then long sitting-- felt some hip discomfort, so discontnued Standing Hip IR/ER on stool x 15 reps Hamstring curl x 12 reps R and L  Step ups 4 x 12 reps anteriorly R and L sides Manual Therapy: Sidelying QL -- some tightness noted Gentle stretching in quadriped Neuromuscular re-ed: Single leg standing activities with cues for hip engagement and level pelvic positioning    [facility] Adult PT Treatment:                                                DATE: 03/19/24 Therapeutic Exercise: Hip abduction isometric 2 x 10; 5 sec hold Walking in clinic 2 x 15 ft without AD LAQ 2 x 10 @ 1.5 lbs Hip ER/IR with stool 2 x 10  Seated HS curl 2 x 10 green band  Quadruped rocking x 10   Neuromuscular re-ed: SLS with UE support focusing on level pelvis x 5 reps  Self Care: Ice for pain/soreness Wean from cane as able without limp                                                                                                                    [facility] Adult PT Treatment:                                                DATE: 03/17/24 Therapeutic Exercise: See HEP established at eval Self Care: Educated on use of cane when out of her home and in the evening when fatigued (to avoid limping/antalgic gait pattern)  PATIENT EDUCATION:  Education details: HEP Person educated: Patient Education  method: Explanation, Demonstration, and Handouts Education comprehension: verbalized understanding and returned  demonstration  HOME EXERCISE PROGRAM: Access Code: [facility] URL: [email or web address] Date: 03/19/2024 Prepared by: Lucie Meeter  Program Notes Start with 1 set of exercises this week (7/7) and ensure you tolerate them without pain before adding a 2nd set.  Exercises - Supine Heel Slide  - 1 x daily - 7 x weekly - 2 sets - 10 reps - Supine March  - 1 x daily - 7 x weekly - 2 sets - 10 reps - Supine Hip Adduction Isometric with Ball  - 1 x daily - 7 x weekly - 2 sets - 10 reps - Standing Hip Extension with Counter Support  - 2 x daily - 7 x weekly - 1 sets - 10 reps - Standing Hip Abduction with Counter Support  - 2 x daily - 7 x weekly - 1 sets - 10 reps - Standing Shoulder and Trunk Flexion at Table  - 1 x daily - 7 x weekly - 1 sets - 2 reps - 20 seconds hold - Hooklying Isometric Hip Abduction with Belt  - 1 x daily - 7 x weekly - 2 sets - 10 reps - Standing Shoulder and Trunk Flexion at Table  - 1 x daily - 7 x weekly - 1 sets - 2 reps - 20 seconds hold  ASSESSMENT:  CLINICAL IMPRESSION: The patient was sore after last session, so reduced intensity of exercises today. Her R foot flared after doing HEP that we worked on last session. PT added some foot exercise b/c this was leading to dec'd load into the R LE, which we are encouraging to normalize the gait pattern. She has no foot precautions but has h/o surgery approximately a year ago. PT recommended reduced reps and intensity of exercises if soreness persists. PT to continue to progress meeting STG for single leg standing. Plan to continue to work towards other STGs/LTGs improving hip strength, hip mobility , and gait mechanics.   EVAL: Patient is a 68 y.o. female who was seen today for physical therapy evaluation and treatment s/p R glut med repair. She is 6.5 weeks post op today and cleared for WBAT and to begin strengthening. PT initiated HEP today for gentle ROM, glut activations, and to also address R QL pain.  Patient feels that cane use is flaring other pain including L 1st MCP joint, L shoulder, and R QL. PT discussed the rationale for cane use discussing goal to normalize gait and avoid an antalgic pattern, therefore, patient to use when in the community and in the evening if she is fatigued. PT to address deficits and promote return to prior functional status.   OBJECTIVE IMPAIRMENTS: Abnormal gait, decreased activity tolerance, decreased balance, decreased mobility, difficulty walking, decreased ROM, decreased strength, increased fascial restrictions, impaired flexibility, and pain.   GOALS: Goals reviewed with patient? Yes  SHORT TERM GOALS: Target date: 04/16/24  The patient will be indep with initial HEP Baseline: initiated at eval Goal status: INITIAL  2.  The patient will improve LEFS by 8% to demonstrate improved functional abilities. Baseline:  47.5% Goal status: INITIAL  3.   The patient will  demonstrate normal gait pattern with no evidence of antalgic pattern x 500 ft to d/c cane for community distances. Baseline:  Gets antalgic pattern after 100 ft in clinic Goal status: INITIAL  4.  The patient will maintain single leg standing R side x 8 seconds to demo good hip stability for functional  loading. Baseline:  2 seconds, L is 10 seconds. Goal status: MET on 03/27/24 10 seconds each side  LONG TERM GOALS: Target date: 05/17/24  The patient will be indep with progression of HEP. Baseline:  initiated at eval Goal status: INITIAL  2.  The patient will improve LEFS by 15% to demonstrate improved functional abilities. Baseline: 47.5% Goal status: INITIAL  3.  The patient will report no pain in R quadratus lumborum region demonstrating reduced compensatory strategies with gait. Baseline:  Is using QL for hip hike and has pain at eval Goal status: INITIAL  4.  The patient will be able to negotiate 12 steps with one rail and reciprocal pattern mod indep. Baseline:  has done steps 1x  since d/c home Goal status: INITIAL  5.  The patient will demo improved R LE strength demonstrating SLR x 10 reps. Baseline:   to assess MMT Goal status: INITIAL  PLAN:  PT FREQUENCY: 2x/week  PT DURATION: 8 weeks  PLANNED INTERVENTIONS: 97164- PT Re-evaluation, 97750- Physical Performance Testing, 97110-Therapeutic exercises, 97530- Therapeutic activity, [id number]- Neuromuscular re-education, 97535- Self Care, [id number]- Manual therapy, [phone]- Gait training, [phone]- Aquatic Therapy, [phone]- Electrical stimulation (unattended), [phone] (1-2 muscles), [id number] (3+ muscles)- Dry Needling, Patient/Family education, Balance training, Stair training, Taping, Joint mobilization, Cryotherapy, and Moist heat  PLAN FOR NEXT SESSION: Progress HEP for R hip strengthening, normalize gait pattern without device, work on QL lengthening due to pain, work on stairs and R LE stance control.  *anterior tibialis strengthening (due to dec'd eccentric control), treadmill working on incline for negotiating driveway   Riven Beebe, PT 03/27/24 2:51 PM

## 2024-03-26 NOTE — Progress Notes (Signed)
 Molly Giles

## 2024-03-27 ENCOUNTER — Ambulatory Visit: Admitting: Rehabilitative and Restorative Service Providers"

## 2024-03-27 ENCOUNTER — Encounter: Payer: Self-pay | Admitting: Rehabilitative and Restorative Service Providers"

## 2024-03-27 DIAGNOSIS — M6281 Muscle weakness (generalized): Secondary | ICD-10-CM

## 2024-03-27 DIAGNOSIS — M25551 Pain in right hip: Secondary | ICD-10-CM

## 2024-03-27 DIAGNOSIS — R2689 Other abnormalities of gait and mobility: Secondary | ICD-10-CM

## 2024-03-31 ENCOUNTER — Ambulatory Visit

## 2024-03-31 DIAGNOSIS — M25551 Pain in right hip: Secondary | ICD-10-CM

## 2024-03-31 DIAGNOSIS — M6281 Muscle weakness (generalized): Secondary | ICD-10-CM

## 2024-03-31 DIAGNOSIS — R2689 Other abnormalities of gait and mobility: Secondary | ICD-10-CM

## 2024-03-31 NOTE — Therapy (Signed)
 OUTPATIENT PHYSICAL THERAPY LOWER EXTREMITY TREATMENT   Patient Name: Molly Giles MRN: [id number] DOB:1955/09/15, 68 y.o., female Today's Date: 03/31/2024  END OF SESSION:  PT End of Session - 03/31/24 1148     Visit Number 5    Number of Visits 16    Date for PT Re-Evaluation 04/16/24    Authorization Type medicare and BCBS supplemental    Progress Note Due on Visit 10    PT Start Time 1148    PT Stop Time 1230    PT Time Calculation (min) 42 min    Activity Tolerance Patient tolerated treatment well    Behavior During Therapy WFL for tasks assessed/performed           Past Medical History:  Diagnosis Date   Abdominal bloating 05/10/2021   Abdominal discomfort, generalized 05/07/2016   Acquired hallux rigidus of right foot 09/15/2022   Acute gastritis 11/19/2020   Allergic rhinoconjunctivitis 07/02/2015   Anal fissure 11/19/2020   Anterior to posterior tear of superior glenoid labrum of left shoulder 02/22/2021   Arthritis    Arthritis of carpometacarpal (CMC) joint of left thumb 09/07/2023   Aseptic loosening of prosthetic hip, initial encounter (HCC) 11/29/2017   Benign essential hypertension 10/28/2012   Benign positional vertigo 04/2015   Responded well to [facility]   Bruxism (teeth grinding)    Cervical spondylosis 11/18/2018   Chronic contact dermatitis 08/06/2018   Per allergist Dr Almarie Scala.    Chronic interstitial cystitis 11/09/2011   Managed by [facility] urology branch in [place] hydrodistention 5 years ago with Dr Claudene in [place], has relief of symptoms until now     Chronic low back pain 05/14/2019   Chronic migraine 02/25/2016   Per Dr Ines review in notes from [facility] from September 2015. Showed total headache days last month 18. Severe headache days 7 days. Moderate headache days 5 days. Mild headache days last month sick days. Days without headache last month 10 days. Symptoms associated with photophobia,  phonophobia, osmophobia, neck pain, dizziness, jaw pain, nasal congestion, vision disturbances, tingling and numbness, weakness and worsening with activity. Each headache attack last 3 hours depending on treatment in severity. Left side, the right side, easier side, the frontal area in the back of the head. Characterized as throbbing, pressure, tightness, squeezing, stabbing and burning    Chronic migraine without aura 05/15/2013    Dr Ines [facility]   Chronic tonsillitis 01/27/2019   DDD (degenerative disc disease), cervical 11/18/2018   DDD (degenerative disc disease), lumbar    Depression    Disc displacement, lumbar    Dysphagia 10/11/2021   Dysrhythmia    seen by dr Blanca- not a problem since she has been on Bystolic    Epiretinal membrane (ERM) of left eye 10/17/2021   Periodic ophthalmological monitoring     Episodic cluster headache, not intractable 03/06/2017   Essential hypertension 05/15/2013   Family history of adverse reaction to anesthesia    Brother- N/V   Family history of premature CAD 05/15/2013   Fibromyalgia 10/01/2013   Management by Dr GORMAN. Devashwar ([facility]he[facility])     Fibromyalgia syndrome 07/01/2015   Management by Dr GORMAN. Devashwar ([facility]he[facility])    GERD (gastroesophageal reflux disease) 12/16/2014   H/O seasonal allergies    Hallux rigidus of right foot 09/25/2022   Hammer toe    Left great toe   Hashimoto's thyroiditis    Per patient, diagnosed by Dr. Sharyne Pacini   Hearing loss of  both ears 07/27/2015   mild to borderline moderate low frequency hearing loss improving to within normal limits bilaterally on audiology testing at [facility] in November 2016.     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   Hyperlipidemia 1998   Hypokalemia due to excessive gastrointestinal loss of potassium 07/28/2019   Hypothyroidism    Iliopsoas bursitis of left hip 05/08/2022   Impairment of balance 02/2015   Consequent of postconcussive syndrome   Injury of triangular fibrocartilage complex (TFCC) 02/25/2019   Injury of triangular fibrocartilage complex of left wrist 02/25/2019   Dx 02/25/19 Prentice Pagan IV MD ([facility])   Insulin  resistance 07/04/2017   Internal hemorrhoid 01/28/2021   Internal hemorrhoid seen on colonoscopy 10/2020 REGINOLD Sol MD [facility])   Interstitial cystitis    Irritable bowel syndrome with diarrhea 04/03/2016   Keratoconjunctivitis sicca 11/18/2018   (+) ANA     Left ventricular hypertrophy, mild 02/25/2016   ECHOcardiogram report 06/17/15 showing EF55-60%, mild LVH and G1DD    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HCC) 03/10/2018   [facility]   Low serum vitamin B12 01/10/2023   Lumbar facet joint pain    Meibomian gland dysfunction (MGD) of both eyes 10/17/2021   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  Added automatically from request for surgery [id number]     Formatting of this note might be different from the original.  Added automatically from request for surgery [id number]     Obesity (BMI 30.0-34.9) 01/29/2017   Odynophagia 12/20/2021   OSA (obstructive sleep apnea) 12/27/2022   Osteoarthritis of acromioclavicular joint 02/22/2021    Osteoarthritis of left hip 11/01/2015   MRI order by Dr Ernie (ortho) 10/2015 showed significant arthritis of left hip joint with cystic changes in femoral head c/w osteoarthritis   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, lumbar region 10/30/2011   Other insomnia 11/09/2016   Overweight    Pain in joint of left shoulder 11/09/2016   Pain in joint, multiple sites 11/18/2018   Palpitations 05/15/2013   [facility] manages   Perianal candidiasis 12/20/2021   Periodic limb movement sleep disorder 03/28/2017   Perirectal cyst 05/07/2016   Plantar fasciitis of right foot 11/10/2022   Poison ivy dermatitis 03/24/2021   PONV (postoperative nausea and vomiting)    Positive ANA (antinuclear antibody) 11/18/2018   Post concussion syndrome 06/06/2015   Post concussive syndrome 07/14/2015   Ms Pomerleau's post-concussive syndrome manifesting in vertigo and headache, mood changes, poor balance, dizziness, and decreased concentration per Dr Ines at [facility].    Posterior vitreous detachment of right eye 2014   Premature menopause 12/20/2021   Primary osteoarthritis of left knee 06/20/2018   Psychophysiological insomnia 11/07/2022   Pure hypercholesterolemia  05/15/2013   Rectal abscess 05/10/2021   S/P left THA, AA 07/25/2016   S/P revision of total hip 04/10/2018   S/P tendon repair 01/23/2024   Sacroiliac inflammation (HCC) 08/18/2021   Severe obstructive sleep apnea-hypopnea syndrome 12/22/2022   Shingles    Shortness of breath dyspnea    with exertion   Sicca syndrome (HCC) 11/18/2018   (+) ANA   Sjogren syndrome (HCC) 01/27/2019   Sjogren's syndrome (HCC) 01/27/2019   Sleep walking and eating 03/06/2017   Snoring 03/06/2017   Spondylosis of lumbar region without myelopathy or radiculopathy 10/30/2011   Status post total hip replacement, left 09/20/2017   TFC (triangular fibrocartilage complex) injury 02/25/2019   Thyroid  nodule 08/11/2009   Findings:  The thyroid  gland is within normal limits in size.  The gland is diffusely inhomogeneous. A small solid nodule is noted in the lower pole  medially on the right of 7 x 6 x 8 mm. A small solid nodule is noted inferiorly on the left of 3 x 3 x 4 mm.  IMPRESSION:  The thyroid  gland is within normal limits in size with only small solid nodules present, the largest of only 8 mm in diameter on the right.     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    Trochanteric bursitis, right hip 04/26/2020   Tubular adenoma of colon 01/28/2021   Colonoscopy screening 4 mm tubular Adenoma polyp Marilu Sol, MD [facility])   Tubular adenoma of colon 01/28/2021   Colonoscopy screening 4 mm tubular Adenoma polyp Marilu Sol, MD [facility])   Vasomotor symptoms due to menopause 04/19/2017   Vertigo 03/19/2023   Vitamin D  deficiency 05/08/2017   Vulvitis 07/22/2020   Yeast vaginitis 07/11/2019   Past Surgical History:  Procedure Laterality Date   arthroscopy Left 01/2022   with SAD and DCR, K. Supple MD   Bladder dilitation     x 3   BREAST BIOPSY Right 2011   Benign histology   CARDIOVASCULAR STRESS TEST  2000   Unremarkable per pt report   CARPOMETACARPAL JOINT ARTHROTOMY Right 2011   COLONOSCOPY     COLONOSCOPY WITH PROPOFOL  N/A 04/21/2015   Procedure: COLONOSCOPY WITH PROPOFOL ;  Surgeon: Belvie Just, MD;  Location: [facility] ENDOSCOPY;  Service: Endoscopy;  Laterality: N/A;   CYSTOSCOPY W/ DILATION OF BLADDER N/A    EPIDURAL BLOCK INJECTION Left 04/12/2016   Left Medial Nerve Block and Left L5 ramus block, Dr Charlie Dolores    EPIDURAL BLOCK INJECTION  03/21/2016   Left L3-4 medial branch block and Left L5 & dorsal ramus block    EPIDURAL BLOCK INJECTION N/A 10/25/2016   Charlie Dolores, MD. Lumbar medial branch block   EPIDURAL BLOCK INJECTION N/A 02/09/2017   Charlie Dolores, MD.  Bilateral L3/4 medial branch block, bilateral L5 dorsal ramus  block   EPIDURAL BLOCK INJECTION N/A 07/04/2017   Charlie Dolores, MD   EXCISION MORTON'S NEUROMA Right 05/03/2023   Procedure: EXCISION MORTON'S NEUROMA;  Surgeon: Kit Rush, MD;  Location: [facility];  Service: Orthopedics;  Laterality: Right;  general, local by surgeon 60 MIN   FECAL TRANSPLANT  04/21/2015   Procedure: FECAL TRANSPLANT;  Surgeon: Belvie Just, MD;  Location: [facility] ENDOSCOPY;  Service: Endoscopy;;   HIP ADDUCTOR TENOTOMY  01/2024   HIP ARTHROPLASTY Left    HIP ARTHROSCOPY Left 03/06/2018   Left hip arthroplasty, redo for loose hip arthroplasty. Procedure at [facility]   INJECTION HIP INTRA ARTICULAR Left 11/2015   for OA by Dr Charlie Bonner REDBIRD IMPLANT PLACEMENT  04/2021   Norleen Sharps MD ([facility])   INTERSTIM IMPLANT REVISION N/A 09/2021   Norleen Sharps MD ([facility])   LIGAMENT REPAIR Right 05/03/2023   Procedure: COLLATERAL LIGAMENT REPAIR;  Surgeon: Kit Norleen, MD;  Location: [facility];  Service: Orthopedics;  Laterality: Right;  general, local by surgeon 60 MIN   OTHER SURGICAL HISTORY Left 2016   Left L3/L4 medial nerve block and Left L5 Dorsal Ramus block Dr FABIENE Bonner   TOTAL HIP ARTHROPLASTY Left 07/25/2016   Procedure: LEFT TOTAL HIP ARTHROPLASTY ANTERIOR APPROACH;  Surgeon: Donnice Car, MD;  Location: [facility];  Service: Orthopedics;  Laterality: Left;   WEIL OSTEOTOMY Right 05/03/2023   Procedure: RIGHT 2ND WEIL OSTEOTOMY;  Surgeon: Kit Norleen, MD;  Location: [facility];  Service: Orthopedics;  Laterality: Right;  general, local by surgeon 60 MIN   Patient Active Problem List   Diagnosis Date Noted   Chronic pain of left upper extremity 02/19/2024   Arthritis of carpometacarpal [facility]) joint of left thumb 09/07/2023   Low serum vitamin B12 01/10/2023   OSA (obstructive sleep apnea) 12/27/2022   Severe obstructive sleep apnea-hypopnea syndrome 12/22/2022   Hyperglycemia  12/11/2022   SOBOE (shortness of breath on exertion) 12/11/2022   Other fatigue 12/11/2022   Sleep related headaches 11/07/2022   Intractable episodic cluster headache 11/07/2022   Psychophysiological insomnia 11/07/2022   Hallux rigidus of right foot 09/25/2022   Chronic pain of right knee 05/08/2022   Arthrosis of hand 12/20/2021   History of Clostridioides difficile colitis, required fecal transplantation 12/20/2021   Meibomian gland dysfunction (MGD) of both eyes 10/17/2021   Nuclear sclerosis of both eyes 10/17/2021   Epiretinal membrane (ERM) of left eye 10/17/2021   OAB (overactive bladder) 03/02/2021   Osteoarthritis of acromioclavicular joint 02/22/2021   Hyperhidrosis, scalp, 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   Chronic low back pain 05/14/2019   Sjogren syndrome (HCC) 01/27/2019   DDD (degenerative disc disease), cervical 12/27/2018   Cervical spondylosis 11/18/2018   DDD (degenerative disc disease), lumbar 11/18/2018   Keratoconjunctivitis sicca 11/18/2018   Chronic contact dermatitis 08/06/2018   Primary osteoarthritis of left knee 06/20/2018   Insulin  resistance 07/04/2017   Irritable bowel syndrome with diarrhea 04/03/2016   Left ventricular hypertrophy, mild 02/25/2016   Allergic rhinoconjunctivitis 07/02/2015   History of colonic polyps 07/01/2015   GERD (gastroesophageal reflux disease) 12/16/2014   Fibromyalgia 10/01/2013   Mood disorder (HCC) 10/01/2013   Hyperlipidemia 10/01/2013   Pure hypercholesterolemia 05/15/2013   Chronic migraine without aura 05/15/2013   Benign essential hypertension 10/28/2012   Hypothyroidism 11/09/2011   Chronic interstitial cystitis 11/09/2011    PCP: McDiarmid, Krystal, MD REFERRING PROVIDER: Daniel Norleen, PA-C REFERRING DIAG: s/p tendon repair Z 98.890  THERAPY DIAG:  Muscle weakness (generalized)  Other abnormalities of gait and mobility  Pain in right hip  Rationale for Evaluation and Treatment: Rehabilitation  ONSET DATE: 01/23/24    SUBJECTIVE:  SUBJECTIVE STATEMENT: Patient had near fall when walking into coffee shop on Saturday. She hit her Rt hip and trunk against a cabinet. She gets a twinge of pain with walking since this happened, but otherwise no other issues.   MD NOTE:  In physical therapy will work on abductor strengthening and gait training. Will work on balance. Will wean off of a walker on to  a cane. Will stay on a cane until is walking without a limp.   PERTINENT HISTORY: HTN, hypercholesterolemia, fibromyalgia, migraines, arthritis, concussion PAIN:  Are you having pain? Yes: NPRS scale: 4 Pain location: Rt lateral hip/back Pain description: achy and sore Aggravating factors: *does not hurt in R hip -- occasional soreness end of day when fatigued Relieving factors: none--mild discomfort  PRECAUTIONS: Other: The patient will be TDWB x 6 weeks, Abductor hip precautions x 6 weeks to the right side.  WEIGHT BEARING RESTRICTIONS: Yes initially TDWB x 6 weeks (she is beyond that per surgical date)  FALLS:  Has patient fallen in last 6 months? No  LIVING ENVIRONMENT: Lives with: lives with their spouse Lives in: House/apartment Stairs: Yes: Internal: 12-14 steps; on right going up Has following equipment at home: Single point cane  PATIENT GOALS: No pain, no cane  NEXT MD VISIT: late August  OBJECTIVE:  Note: Objective measures were completed at Evaluation unless otherwise noted.  PATIENT SURVEYS:  LEFS : 47.5%  COGNITION: Overall cognitive status: Within functional limits for tasks assessed     SENSATION: WFL  POSTURE: No Significant postural limitations  PALPATION: Mild soreness in lateral hip  LOWER EXTREMITY ROM: AROM to 90 degrees hip flexion Passive ROM Right eval Left eval Right 03/27/24  Hip flexion 95  In quadriped 120 deg  Hip extension     Hip abduction 35    Hip adduction     Hip internal rotation 30    Hip external rotation 30    Knee flexion     Knee  extension     Ankle dorsiflexion     Ankle plantarflexion     Ankle inversion     Ankle eversion      (Blank rows = not tested)  LOWER EXTREMITY MMT: did not test due to post surgical status-- will assess as we initiate strengthening progression in PT MMT Right eval Left eval  Hip flexion    Hip extension    Hip abduction    Hip adduction    Hip internal rotation    Hip external rotation    Knee flexion    Knee extension    Ankle dorsiflexion    Ankle plantarflexion    Ankle inversion    Ankle eversion     (Blank rows = not tested)  BALANCE: R single limb x 2 seconds  L single limb x 10 seconds  GAIT: Distance walked: 150 ft Assistive device utilized: Single point cane and None Level of assistance: Modified independence Comments: 3.2 ft/sec-- patient does start off without a limp and after 100 ft has a R antalgic gait pattern *PT and patient discussed still needing the cane when outdoors and in the evening when fatigued [facility] Adult PT Treatment:                                                DATE: 03/31/24 Therapeutic Exercise: Open book x 10  LAQ 2 x 10 @ 2 lbs  Updated HEP  Neuromuscular re-ed: Supine TA march yellow band 2 x 10  Hooklying hip abduction (single activation) 2 x 10; yellow band  Clamshells 2 x 10; 1 lb  Prone hip extension 2 x 10  Hip bridge with adduction ball squeeze x 7; d/c due to lateral hip pain    [facility] Adult PT Treatment:  DATE: 03/27/24 Therapeutic Exercise: Supine Hip extension isometrics into a yoga ball x 10 reps with 5 second holds Hooklying isometric abduction x 5 second holds x 6 reps Using deflated yoga ball to roll out sacrum in supine Quadriped Rocking anterior/posterior Sidelying  Hip abduction x 10 reps SLR Neuromuscular re-ed: Single leg standing R and L-- PT corrected L hip adduction and IR during R leg stance Gait: Ambulation x 240 ft without antalgic hip pattern Patient  does continue with narrow base of support-- we discussed-- if she abducts legs to 2-4 width during gait she gets R QL discomfort Discussed her orthotics, shoewear   [facility] Adult PT Treatment:                                                DATE: 03/24/24 Therapeutic Exercise: Supine Hip abduction isometrics using black belt for strap x 5 reps Progressed to red band doing isotonic within pain free ROM Sidelying Hip hike/depression x 10 reps with R foot supported on deflated pilates ball Quadriped Rocking ant/posterior Seated--patient c/o pain R foot with standing PT provided HEP for foot -- has h/o injury without any precautions Towel scrunch Ball -- toe flexion/extension Arch lifting Ankle DF sitting-- then long sitting-- felt some hip discomfort, so discontnued Standing Hip IR/ER on stool x 15 reps Hamstring curl x 12 reps R and L  Step ups 4 x 12 reps anteriorly R and L sides Manual Therapy: Sidelying QL -- some tightness noted Gentle stretching in quadriped Neuromuscular re-ed: Single leg standing activities with cues for hip engagement and level pelvic positioning    [facility] Adult PT Treatment:                                                DATE: 03/19/24 Therapeutic Exercise: Hip abduction isometric 2 x 10; 5 sec hold Walking in clinic 2 x 15 ft without AD LAQ 2 x 10 @ 1.5 lbs Hip ER/IR with stool 2 x 10  Seated HS curl 2 x 10 green band  Quadruped rocking x 10   Neuromuscular re-ed: SLS with UE support focusing on level pelvis x 5 reps  Self Care: Ice for pain/soreness Wean from cane as able without limp    PATIENT EDUCATION:  Education details: HEP update Person educated: Patient Education method: Explanation, Demonstration, and Handouts Education comprehension: verbalized understanding and returned demonstration  HOME EXERCISE PROGRAM: Access Code: [facility] URL: [email or web address] Date: 03/31/2024 Prepared by: Lucie Meeter  Program  Notes n/a  Exercises - Quadruped Rocking Backward  - 1 x daily - 5 x weekly - 1 sets - 10 reps - Standing Hip Extension with Counter Support  - 1 x daily - 5 x weekly - 1 sets - 10 reps - Standing Hip Abduction with Counter Support  - 1 x daily - 5 x weekly - 1 sets - 10 reps - Standing Shoulder and Trunk Flexion at Table  - 1 x daily - 5 x weekly - 1 sets - 2 reps - 20 seconds hold - Towel Scrunches  - 1 x daily - 4-5 x weekly - 1 sets - 10 reps - Seated Plantar Fascia Mobilization with Small Ball  - 1 x daily - 4-5  x weekly - 1 sets - 10 reps - Seated Arch Lifts  - 1 x daily - 4-5 x weekly - 1 sets - 10 reps - Hooklying Isometric Clamshell  - 1 x daily - 5 x weekly - 2 sets - 10 reps - Supine March with Resistance Band  - 1 x daily - 5 x weekly - 2 sets - 10 reps - Clamshell  - 1 x daily - 5 x weekly - 2 sets - 10 reps - Prone Hip Extension  - 1 x daily - 5 x weekly - 2 sets - 10 reps  ASSESSMENT:  CLINICAL IMPRESSION: Patient reports near fall over the weekend when walking into coffee shop. She was able to catch herself, but bumped into cabinet with the Rt hip/trunk and still has twinges of pain in the hip with walking. Given this mild flare up we focused on mat based hip strength progression with fairly good tolerance. She did experience sharp lateral hip pain towards end of prescribed reps with bridging activity, so this was discontinued. She reported muscle fatigue at conclusion, but no increase in pain.   EVAL: Patient is a 68 y.o. female who was seen today for physical therapy evaluation and treatment s/p R glut med repair. She is 6.5 weeks post op today and cleared for WBAT and to begin strengthening. PT initiated HEP today for gentle ROM, glut activations, and to also address R QL pain. Patient feels that cane use is flaring other pain including L 1st MCP joint, L shoulder, and R QL. PT discussed the rationale for cane use discussing goal to normalize gait and avoid an antalgic  pattern, therefore, patient to use when in the community and in the evening if she is fatigued. PT to address deficits and promote return to prior functional status.   OBJECTIVE IMPAIRMENTS: Abnormal gait, decreased activity tolerance, decreased balance, decreased mobility, difficulty walking, decreased ROM, decreased strength, increased fascial restrictions, impaired flexibility, and pain.   GOALS: Goals reviewed with patient? Yes  SHORT TERM GOALS: Target date: 04/16/24  The patient will be indep with initial HEP Baseline: initiated at eval Goal status: INITIAL  2.  The patient will improve LEFS by 8% to demonstrate improved functional abilities. Baseline:  47.5% Goal status: INITIAL  3.   The patient will  demonstrate normal gait pattern with no evidence of antalgic pattern x 500 ft to d/c cane for community distances. Baseline:  Gets antalgic pattern after 100 ft in clinic Goal status: INITIAL  4.  The patient will maintain single leg standing R side x 8 seconds to demo good hip stability for functional loading. Baseline:  2 seconds, L is 10 seconds. Goal status: MET on 03/27/24 10 seconds each side  LONG TERM GOALS: Target date: 05/17/24  The patient will be indep with progression of HEP. Baseline:  initiated at eval Goal status: INITIAL  2.  The patient will improve LEFS by 15% to demonstrate improved functional abilities. Baseline: 47.5% Goal status: INITIAL  3.  The patient will report no pain in R quadratus lumborum region demonstrating reduced compensatory strategies with gait. Baseline:  Is using QL for hip hike and has pain at eval Goal status: INITIAL  4.  The patient will be able to negotiate 12 steps with one rail and reciprocal pattern mod indep. Baseline:  has done steps 1x since d/c home Goal status: INITIAL  5.  The patient will demo improved R LE strength demonstrating SLR x 10 reps. Baseline:  to assess MMT Goal status: INITIAL  PLAN:  PT FREQUENCY:  2x/week  PT DURATION: 8 weeks  PLANNED INTERVENTIONS: [id number]- PT Re-evaluation, 97750- Physical Performance Testing, 97110-Therapeutic exercises, 97530- Therapeutic activity, [id number]- Neuromuscular re-education, 97535- Self Care, [id number]- Manual therapy, [phone]- Gait training, [phone]- Aquatic Therapy, [phone]- Electrical stimulation (unattended), [phone] (1-2 muscles), [phone] (3+ muscles)- Dry Needling, Patient/Family education, Balance training, Stair training, Taping, Joint mobilization, Cryotherapy, and Moist heat  PLAN FOR NEXT SESSION: Progress HEP for R hip strengthening, normalize gait pattern without device, work on QL lengthening due to pain, work on stairs and R LE stance control.  *anterior tibialis strengthening (due to dec'd eccentric control), treadmill working on incline for negotiating driveway  Lucie Meeter, PT, DPT, ATC 03/31/24 12:35 PM

## 2024-04-01 ENCOUNTER — Other Ambulatory Visit: Payer: Self-pay | Admitting: Family Medicine

## 2024-04-02 ENCOUNTER — Ambulatory Visit

## 2024-04-02 DIAGNOSIS — R2689 Other abnormalities of gait and mobility: Secondary | ICD-10-CM

## 2024-04-02 DIAGNOSIS — M6281 Muscle weakness (generalized): Secondary | ICD-10-CM

## 2024-04-02 DIAGNOSIS — M25551 Pain in right hip: Secondary | ICD-10-CM

## 2024-04-02 NOTE — Therapy (Signed)
 OUTPATIENT PHYSICAL THERAPY LOWER EXTREMITY TREATMENT   Patient Name: Molly Giles MRN: [id number] DOB:Oct 19, 1955, 68 y.o., female Today's Date: 04/02/2024  END OF SESSION:  PT End of Session - 04/02/24 1151     Visit Number 6    Number of Visits 16    Date for PT Re-Evaluation 04/16/24    Authorization Type medicare and BCBS supplemental    Progress Note Due on Visit 10    PT Start Time 1150    PT Stop Time 1230    PT Time Calculation (min) 40 min    Activity Tolerance Patient tolerated treatment well    Behavior During Therapy [facility] for tasks assessed/performed           Past Medical History:  Diagnosis Date   Abdominal bloating 05/10/2021   Abdominal discomfort, generalized 05/07/2016   Acquired hallux rigidus of right foot 09/15/2022   Acute gastritis 11/19/2020   Allergic rhinoconjunctivitis 07/02/2015   Anal fissure 11/19/2020   Anterior to posterior tear of superior glenoid labrum of left shoulder 02/22/2021   Arthritis    Arthritis of carpometacarpal (CMC) joint of left thumb 09/07/2023   Aseptic loosening of prosthetic hip, initial encounter (HCC) 11/29/2017   Benign essential hypertension 10/28/2012   Benign positional vertigo 04/2015   Responded well to [facility]   Bruxism (teeth grinding)    Cervical spondylosis 11/18/2018   Chronic contact dermatitis 08/06/2018   Per allergist Dr Almarie Scala.    Chronic interstitial cystitis 11/09/2011   Managed by [facility] urology branch in [place] hydrodistention 5 years ago with Dr Claudene in [place], has relief of symptoms until now     Chronic low back pain 05/14/2019   Chronic migraine 02/25/2016   Per Dr Ines review in notes from [facility] from September 2015. Showed total headache days last month 18. Severe headache days 7 days. Moderate headache days 5 days. Mild headache days last month sick days. Days without headache last month 10 days. Symptoms associated with photophobia,  phonophobia, osmophobia, neck pain, dizziness, jaw pain, nasal congestion, vision disturbances, tingling and numbness, weakness and worsening with activity. Each headache attack last 3 hours depending on treatment in severity. Left side, the right side, easier side, the frontal area in the back of the head. Characterized as throbbing, pressure, tightness, squeezing, stabbing and burning    Chronic migraine without aura 05/15/2013    Dr Ines [facility]   Chronic tonsillitis 01/27/2019   DDD (degenerative disc disease), cervical 11/18/2018   DDD (degenerative disc disease), lumbar    Depression    Disc displacement, lumbar    Dysphagia 10/11/2021   Dysrhythmia    seen by dr Blanca- not a problem since she has been on Bystolic    Epiretinal membrane (ERM) of left eye 10/17/2021   Periodic ophthalmological monitoring     Episodic cluster headache, not intractable 03/06/2017   Essential hypertension 05/15/2013   Family history of adverse reaction to anesthesia    Brother- N/V   Family history of premature CAD 05/15/2013   Fibromyalgia 10/01/2013   Management by Dr GORMAN. Devashwar (Rheum)     Fibromyalgia syndrome 07/01/2015   Management by Dr GORMAN. Devashwar (Rheum)    GERD (gastroesophageal reflux disease) 12/16/2014   H/O seasonal allergies    Hallux rigidus of right foot 09/25/2022   Hammer toe    Left great toe   Hashimoto's thyroiditis    Per patient, diagnosed by Dr. Sharyne Pacini   Hearing loss of  both ears 07/27/2015   mild to borderline moderate low frequency hearing loss improving to within normal limits bilaterally on audiology testing at [facility] in November 2016.     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   Hyperlipidemia 1998   Hypokalemia due to excessive gastrointestinal loss of potassium 07/28/2019   Hypothyroidism    Iliopsoas bursitis of left hip 05/08/2022   Impairment of balance 02/2015   Consequent of postconcussive syndrome   Injury of triangular fibrocartilage complex (TFCC) 02/25/2019   Injury of triangular fibrocartilage complex of left wrist 02/25/2019   Dx 02/25/19 Prentice Pagan IV MD ([facility])   Insulin  resistance 07/04/2017   Internal hemorrhoid 01/28/2021   Internal hemorrhoid seen on colonoscopy 10/2020 REGINOLD Sol MD [facility])   Interstitial cystitis    Irritable bowel syndrome with diarrhea 04/03/2016   Keratoconjunctivitis sicca 11/18/2018   (+) ANA     Left ventricular hypertrophy, mild 02/25/2016   ECHOcardiogram report 06/17/15 showing EF55-60%, mild LVH and G1DD    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HCC) 03/10/2018   [facility]   Low serum vitamin B12 01/10/2023   Lumbar facet joint pain    Meibomian gland dysfunction (MGD) of both eyes 10/17/2021   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  Added automatically from request for surgery [id number]     Formatting of this note might be different from the original.  Added automatically from request for surgery [id number]     Obesity (BMI 30.0-34.9) 01/29/2017   Odynophagia 12/20/2021   OSA (obstructive sleep apnea) 12/27/2022   Osteoarthritis of acromioclavicular joint 02/22/2021    Osteoarthritis of left hip 11/01/2015   MRI order by Dr Ernie (ortho) 10/2015 showed significant arthritis of left hip joint with cystic changes in femoral head c/w osteoarthritis   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, lumbar region 10/30/2011   Other insomnia 11/09/2016   Overweight    Pain in joint of left shoulder 11/09/2016   Pain in joint, multiple sites 11/18/2018   Palpitations 05/15/2013   [facility] manages   Perianal candidiasis 12/20/2021   Periodic limb movement sleep disorder 03/28/2017   Perirectal cyst 05/07/2016   Plantar fasciitis of right foot 11/10/2022   Poison ivy dermatitis 03/24/2021   PONV (postoperative nausea and vomiting)    Positive ANA (antinuclear antibody) 11/18/2018   Post concussion syndrome 06/06/2015   Post concussive syndrome 07/14/2015   Ms Romig's post-concussive syndrome manifesting in vertigo and headache, mood changes, poor balance, dizziness, and decreased concentration per Dr Ines at [facility].    Posterior vitreous detachment of right eye 2014   Premature menopause 12/20/2021   Primary osteoarthritis of left knee 06/20/2018   Psychophysiological insomnia 11/07/2022   Pure hypercholesterolemia  05/15/2013   Rectal abscess 05/10/2021   S/P left THA, AA 07/25/2016   S/P revision of total hip 04/10/2018   S/P tendon repair 01/23/2024   Sacroiliac inflammation (HCC) 08/18/2021   Severe obstructive sleep apnea-hypopnea syndrome 12/22/2022   Shingles    Shortness of breath dyspnea    with exertion   Sicca syndrome (HCC) 11/18/2018   (+) ANA   Sjogren syndrome (HCC) 01/27/2019   Sjogren's syndrome (HCC) 01/27/2019   Sleep walking and eating 03/06/2017   Snoring 03/06/2017   Spondylosis of lumbar region without myelopathy or radiculopathy 10/30/2011   Status post total hip replacement, left 09/20/2017   TFC (triangular fibrocartilage complex) injury 02/25/2019   Thyroid  nodule 08/11/2009   Findings:  The thyroid  gland is within normal limits in size.  The gland is diffusely inhomogeneous. A small solid nodule is noted in the lower pole  medially on the right of 7 x 6 x 8 mm. A small solid nodule is noted inferiorly on the left of 3 x 3 x 4 mm.  IMPRESSION:  The thyroid  gland is within normal limits in size with only small solid nodules present, the largest of only 8 mm in diameter on the right.     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    Trochanteric bursitis, right hip 04/26/2020   Tubular adenoma of colon 01/28/2021   Colonoscopy screening 4 mm tubular Adenoma polyp Marilu Sol, MD [facility])   Tubular adenoma of colon 01/28/2021   Colonoscopy screening 4 mm tubular Adenoma polyp Marilu Sol, MD [facility])   Vasomotor symptoms due to menopause 04/19/2017   Vertigo 03/19/2023   Vitamin D  deficiency 05/08/2017   Vulvitis 07/22/2020   Yeast vaginitis 07/11/2019   Past Surgical History:  Procedure Laterality Date   arthroscopy Left 01/2022   with SAD and DCR, K. Supple MD   Bladder dilitation     x 3   BREAST BIOPSY Right 2011   Benign histology   CARDIOVASCULAR STRESS TEST  2000   Unremarkable per pt report   CARPOMETACARPAL JOINT ARTHROTOMY Right 2011   COLONOSCOPY     COLONOSCOPY WITH PROPOFOL  N/A 04/21/2015   Procedure: COLONOSCOPY WITH PROPOFOL ;  Surgeon: Belvie Just, MD;  Location: [facility] ENDOSCOPY;  Service: Endoscopy;  Laterality: N/A;   CYSTOSCOPY W/ DILATION OF BLADDER N/A    EPIDURAL BLOCK INJECTION Left 04/12/2016   Left Medial Nerve Block and Left L5 ramus block, Dr Charlie Dolores    EPIDURAL BLOCK INJECTION  03/21/2016   Left L3-4 medial branch block and Left L5 & dorsal ramus block    EPIDURAL BLOCK INJECTION N/A 10/25/2016   Charlie Dolores, MD. Lumbar medial branch block   EPIDURAL BLOCK INJECTION N/A 02/09/2017   Charlie Dolores, MD.  Bilateral L3/4 medial branch block, bilateral L5 dorsal ramus  block   EPIDURAL BLOCK INJECTION N/A 07/04/2017   Charlie Dolores, MD   EXCISION MORTON'S NEUROMA Right 05/03/2023   Procedure: EXCISION MORTON'S NEUROMA;  Surgeon: Kit Rush, MD;  Location: [facility];  Service: Orthopedics;  Laterality: Right;  general, local by surgeon 60 MIN   FECAL TRANSPLANT  04/21/2015   Procedure: FECAL TRANSPLANT;  Surgeon: Belvie Just, MD;  Location: [facility] ENDOSCOPY;  Service: Endoscopy;;   HIP ADDUCTOR TENOTOMY  01/2024   HIP ARTHROPLASTY Left    HIP ARTHROSCOPY Left 03/06/2018   Left hip arthroplasty, redo for loose hip arthroplasty. Procedure at [facility]   INJECTION HIP INTRA ARTICULAR Left 11/2015   for OA by Dr Charlie Bonner REDBIRD IMPLANT PLACEMENT  04/2021   Norleen Sharps MD ([facility])   INTERSTIM IMPLANT REVISION N/A 09/2021   Norleen Sharps MD ([facility])   LIGAMENT REPAIR Right 05/03/2023   Procedure: COLLATERAL LIGAMENT REPAIR;  Surgeon: Kit Norleen, MD;  Location: [facility];  Service: Orthopedics;  Laterality: Right;  general, local by surgeon 60 MIN   OTHER SURGICAL HISTORY Left 2016   Left L3/L4 medial nerve block and Left L5 Dorsal Ramus block Dr FABIENE Bonner   TOTAL HIP ARTHROPLASTY Left 07/25/2016   Procedure: LEFT TOTAL HIP ARTHROPLASTY ANTERIOR APPROACH;  Surgeon: Donnice Car, MD;  Location: [facility];  Service: Orthopedics;  Laterality: Left;   WEIL OSTEOTOMY Right 05/03/2023   Procedure: RIGHT 2ND WEIL OSTEOTOMY;  Surgeon: Kit Norleen, MD;  Location: [facility];  Service: Orthopedics;  Laterality: Right;  general, local by surgeon 60 MIN   Patient Active Problem List   Diagnosis Date Noted   Chronic pain of left upper extremity 02/19/2024   Arthritis of carpometacarpal [facility]) joint of left thumb 09/07/2023   Low serum vitamin B12 01/10/2023   OSA (obstructive sleep apnea) 12/27/2022   Severe obstructive sleep apnea-hypopnea syndrome 12/22/2022   Hyperglycemia  12/11/2022   SOBOE (shortness of breath on exertion) 12/11/2022   Other fatigue 12/11/2022   Sleep related headaches 11/07/2022   Intractable episodic cluster headache 11/07/2022   Psychophysiological insomnia 11/07/2022   Hallux rigidus of right foot 09/25/2022   Chronic pain of right knee 05/08/2022   Arthrosis of hand 12/20/2021   History of Clostridioides difficile colitis, required fecal transplantation 12/20/2021   Meibomian gland dysfunction (MGD) of both eyes 10/17/2021   Nuclear sclerosis of both eyes 10/17/2021   Epiretinal membrane (ERM) of left eye 10/17/2021   OAB (overactive bladder) 03/02/2021   Osteoarthritis of acromioclavicular joint 02/22/2021   Hyperhidrosis, scalp, 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   Chronic low back pain 05/14/2019   Sjogren syndrome (HCC) 01/27/2019   DDD (degenerative disc disease), cervical 12/27/2018   Cervical spondylosis 11/18/2018   DDD (degenerative disc disease), lumbar 11/18/2018   Keratoconjunctivitis sicca 11/18/2018   Chronic contact dermatitis 08/06/2018   Primary osteoarthritis of left knee 06/20/2018   Insulin  resistance 07/04/2017   Irritable bowel syndrome with diarrhea 04/03/2016   Left ventricular hypertrophy, mild 02/25/2016   Allergic rhinoconjunctivitis 07/02/2015   History of colonic polyps 07/01/2015   GERD (gastroesophageal reflux disease) 12/16/2014   Fibromyalgia 10/01/2013   Mood disorder (HCC) 10/01/2013   Hyperlipidemia 10/01/2013   Pure hypercholesterolemia 05/15/2013   Chronic migraine without aura 05/15/2013   Benign essential hypertension 10/28/2012   Hypothyroidism 11/09/2011   Chronic interstitial cystitis 11/09/2011    PCP: McDiarmid, Krystal, MD REFERRING PROVIDER: Daniel Norleen, PA-C REFERRING DIAG: s/p tendon repair Z 98.890  THERAPY DIAG:  Muscle weakness (generalized)  Other abnormalities of gait and mobility  Pain in right hip  Rationale for Evaluation and Treatment: Rehabilitation  ONSET DATE: 01/23/24    SUBJECTIVE:  SUBJECTIVE STATEMENT: Patient reports mostly feeling pain about the Rt QL. She was sore in the hip after last session, but soreness was gone yesterday. No pain in the hip currently.   MD NOTE:  In physical therapy will work on abductor strengthening and gait training. Will work on balance. Will wean off of a walker on to a cane. Will stay on a cane until  is walking without a limp.   PERTINENT HISTORY: HTN, hypercholesterolemia, fibromyalgia, migraines, arthritis, concussion PAIN:  Are you having pain? Yes: NPRS scale: 3 Pain location: Rt QL,low back Pain description: achy and sore Aggravating factors: walking Relieving factors: none--mild discomfort  PRECAUTIONS: Other: The patient will be TDWB x 6 weeks, Abductor hip precautions x 6 weeks to the right side.  WEIGHT BEARING RESTRICTIONS: Yes initially TDWB x 6 weeks (she is beyond that per surgical date)  FALLS:  Has patient fallen in last 6 months? No  LIVING ENVIRONMENT: Lives with: lives with their spouse Lives in: House/apartment Stairs: Yes: Internal: 12-14 steps; on right going up Has following equipment at home: Single point cane  PATIENT GOALS: No pain, no cane  NEXT MD VISIT: late August  OBJECTIVE:  Note: Objective measures were completed at Evaluation unless otherwise noted.  PATIENT SURVEYS:  LEFS : 47.5%  COGNITION: Overall cognitive status: Within functional limits for tasks assessed     SENSATION: WFL  POSTURE: No Significant postural limitations  PALPATION: Mild soreness in lateral hip  LOWER EXTREMITY ROM: AROM to 90 degrees hip flexion Passive ROM Right eval Left eval Right 03/27/24  Hip flexion 95  In quadriped 120 deg  Hip extension     Hip abduction 35    Hip adduction     Hip internal rotation 30    Hip external rotation 30    Knee flexion     Knee extension     Ankle dorsiflexion     Ankle plantarflexion     Ankle inversion     Ankle eversion      (Blank rows =  not tested)  LOWER EXTREMITY MMT: did not test due to post surgical status-- will assess as we initiate strengthening progression in PT MMT Right eval Left eval  Hip flexion    Hip extension    Hip abduction    Hip adduction    Hip internal rotation    Hip external rotation    Knee flexion    Knee extension    Ankle dorsiflexion    Ankle plantarflexion    Ankle inversion    Ankle eversion     (Blank rows = not tested)  BALANCE: R single limb x 2 seconds  L single limb x 10 seconds  GAIT: Distance walked: 150 ft Assistive device utilized: Single point cane and None Level of assistance: Modified independence Comments: 3.2 ft/sec-- patient does start off without a limp and after 100 ft has a R antalgic gait pattern *PT and patient discussed still needing the cane when outdoors and in the evening when fatigued [facility] Adult PT Treatment:                                                DATE: 04/02/24 Therapeutic Exercise: Open book x 10  Childs pose 2 x 30 sec  Hip IR/ER on stool 2  x10  Standing march x 10 Manual Therapy: STM Rt QL, lumbar paraspinals Neuromuscular re-ed: Clamshells yellow band 2 x 10  SLR 2 x 10; partial range Therapeutic Activity: Treadmill walking incline level 2, speed 1.1 x 5 minutes  Leg press 2 x 10 @ 45 lbs    [facility] Adult PT Treatment:  DATE: 03/31/24 Therapeutic Exercise: Open book x 10  LAQ 2 x 10 @ 2 lbs  Updated HEP  Neuromuscular re-ed: Supine TA march yellow band 2 x 10  Hooklying hip abduction (single activation) 2 x 10; yellow band  Clamshells 2 x 10; 1 lb  Prone hip extension 2 x 10  Hip bridge with adduction ball squeeze x 7; d/c due to lateral hip pain    [facility] Adult PT Treatment:                                                DATE: 03/27/24 Therapeutic Exercise: Supine Hip extension isometrics into a yoga ball x 10 reps with 5 second holds Hooklying isometric abduction x 5 second  holds x 6 reps Using deflated yoga ball to roll out sacrum in supine Quadriped Rocking anterior/posterior Sidelying  Hip abduction x 10 reps SLR Neuromuscular re-ed: Single leg standing R and L-- PT corrected L hip adduction and IR during R leg stance Gait: Ambulation x 240 ft without antalgic hip pattern Patient does continue with narrow base of support-- we discussed-- if she abducts legs to 2-4 width during gait she gets R QL discomfort Discussed her orthotics, shoewear    PATIENT EDUCATION:  Education details: HEP review Person educated: Patient Education method: Explanation Education comprehension: verbalized understanding  HOME EXERCISE PROGRAM: Access Code: [facility] URL: [email or web address] Date: 03/31/2024 Prepared by: Lucie Meeter  Program Notes n/a  Exercises - Quadruped Rocking Backward  - 1 x daily - 5 x weekly - 1 sets - 10 reps - Standing Hip Extension with Counter Support  - 1 x daily - 5 x weekly - 1 sets - 10 reps - Standing Hip Abduction with Counter Support  - 1 x daily - 5 x weekly - 1 sets - 10 reps - Standing Shoulder and Trunk Flexion at Table  - 1 x daily - 5 x weekly - 1 sets - 2 reps - 20 seconds hold - Towel Scrunches  - 1 x daily - 4-5 x weekly - 1 sets - 10 reps - Seated Plantar Fascia Mobilization with Small Ball  - 1 x daily - 4-5 x weekly - 1 sets - 10 reps - Seated Arch Lifts  - 1 x daily - 4-5 x weekly - 1 sets - 10 reps - Hooklying Isometric Clamshell  - 1 x daily - 5 x weekly - 2 sets - 10 reps - Supine March with Resistance Band  - 1 x daily - 5 x weekly - 2 sets - 10 reps - Clamshell  - 1 x daily - 5 x weekly - 2 sets - 10 reps - Prone Hip Extension  - 1 x daily - 5 x weekly - 2 sets - 10 reps  ASSESSMENT:  CLINICAL IMPRESSION: Patient reports pain is mostly localized to Rt low back/QL and is not experiencing hip pain. She is noted to have tautness and palpable tenderness about Rt lumbar paraspinals/QL with partial  release from manual therapy. We introduced inclined treadmill walking with patient noting Rt hip fatigue around 3 minutes, but tolerates 5 minutes without onset of hip pain. Introduced leg press with patient demonstrating good control and form without hip pain. Fatigued with progression of hip strengthening, but overall good tolerance.   EVAL: Patient is a 68 y.o. female who was seen today for physical  therapy evaluation and treatment s/p R glut med repair. She is 6.5 weeks post op today and cleared for WBAT and to begin strengthening. PT initiated HEP today for gentle ROM, glut activations, and to also address R QL pain. Patient feels that cane use is flaring other pain including L 1st MCP joint, L shoulder, and R QL. PT discussed the rationale for cane use discussing goal to normalize gait and avoid an antalgic pattern, therefore, patient to use when in the community and in the evening if she is fatigued. PT to address deficits and promote return to prior functional status.   OBJECTIVE IMPAIRMENTS: Abnormal gait, decreased activity tolerance, decreased balance, decreased mobility, difficulty walking, decreased ROM, decreased strength, increased fascial restrictions, impaired flexibility, and pain.   GOALS: Goals reviewed with patient? Yes  SHORT TERM GOALS: Target date: 04/16/24  The patient will be indep with initial HEP Baseline: initiated at eval Goal status: INITIAL  2.  The patient will improve LEFS by 8% to demonstrate improved functional abilities. Baseline:  47.5% Goal status: INITIAL  3.   The patient will  demonstrate normal gait pattern with no evidence of antalgic pattern x 500 ft to d/c cane for community distances. Baseline:  Gets antalgic pattern after 100 ft in clinic Goal status: INITIAL  4.  The patient will maintain single leg standing R side x 8 seconds to demo good hip stability for functional loading. Baseline:  2 seconds, L is 10 seconds. Goal status: MET on 03/27/24 10  seconds each side  LONG TERM GOALS: Target date: 05/17/24  The patient will be indep with progression of HEP. Baseline:  initiated at eval Goal status: INITIAL  2.  The patient will improve LEFS by 15% to demonstrate improved functional abilities. Baseline: 47.5% Goal status: INITIAL  3.  The patient will report no pain in R quadratus lumborum region demonstrating reduced compensatory strategies with gait. Baseline:  Is using QL for hip hike and has pain at eval Goal status: INITIAL  4.  The patient will be able to negotiate 12 steps with one rail and reciprocal pattern mod indep. Baseline:  has done steps 1x since d/c home Goal status: INITIAL  5.  The patient will demo improved R LE strength demonstrating SLR x 10 reps. Baseline:   to assess MMT Goal status: INITIAL  PLAN:  PT FREQUENCY: 2x/week  PT DURATION: 8 weeks  PLANNED INTERVENTIONS: [id number]- PT Re-evaluation, 97750- Physical Performance Testing, 97110-Therapeutic exercises, 97530- Therapeutic activity, [id number]- Neuromuscular re-education, 97535- Self Care, [id number]- Manual therapy, [phone]- Gait training, [phone]- Aquatic Therapy, [phone]- Electrical stimulation (unattended), [phone] (1-2 muscles), [phone] (3+ muscles)- Dry Needling, Patient/Family education, Balance training, Stair training, Taping, Joint mobilization, Cryotherapy, and Moist heat  PLAN FOR NEXT SESSION: Progress HEP for R hip strengthening, normalize gait pattern without device, work on QL lengthening due to pain, work on stairs and R LE stance control.  *anterior tibialis strengthening (due to dec'd eccentric control), treadmill working on incline for negotiating driveway  Lucie Meeter, PT, DPT, ATC 04/02/24 12:32 PM

## 2024-04-04 ENCOUNTER — Other Ambulatory Visit: Payer: Self-pay | Admitting: Family Medicine

## 2024-04-07 ENCOUNTER — Ambulatory Visit: Admitting: Rehabilitative and Restorative Service Providers"

## 2024-04-07 ENCOUNTER — Encounter: Payer: Self-pay | Admitting: Rehabilitative and Restorative Service Providers"

## 2024-04-07 DIAGNOSIS — M25551 Pain in right hip: Secondary | ICD-10-CM

## 2024-04-07 DIAGNOSIS — R2689 Other abnormalities of gait and mobility: Secondary | ICD-10-CM

## 2024-04-07 DIAGNOSIS — M6281 Muscle weakness (generalized): Secondary | ICD-10-CM | POA: Diagnosis not present

## 2024-04-07 NOTE — Therapy (Signed)
 OUTPATIENT PHYSICAL THERAPY LOWER EXTREMITY TREATMENT   Patient Name: Molly Giles MRN: [id number] DOB:15-Nov-1955, 68 y.o., female Today's Date: 04/07/2024  END OF SESSION:  PT End of Session - 04/07/24 1453     Visit Number 7    Number of Visits 16    Date for PT Re-Evaluation 04/16/24    Authorization Type medicare and BCBS supplemental    Progress Note Due on Visit 10    PT Start Time 1450    PT Stop Time 1533    PT Time Calculation (min) 43 min    Activity Tolerance Patient tolerated treatment well    Behavior During Therapy [facility] for tasks assessed/performed           Past Medical History:  Diagnosis Date   Abdominal bloating 05/10/2021   Abdominal discomfort, generalized 05/07/2016   Acquired hallux rigidus of right foot 09/15/2022   Acute gastritis 11/19/2020   Allergic rhinoconjunctivitis 07/02/2015   Anal fissure 11/19/2020   Anterior to posterior tear of superior glenoid labrum of left shoulder 02/22/2021   Arthritis    Arthritis of carpometacarpal (CMC) joint of left thumb 09/07/2023   Aseptic loosening of prosthetic hip, initial encounter (HCC) 11/29/2017   Benign essential hypertension 10/28/2012   Benign positional vertigo 04/2015   Responded well to [facility]   Bruxism (teeth grinding)    Cervical spondylosis 11/18/2018   Chronic contact dermatitis 08/06/2018   Per allergist Dr Almarie Scala.    Chronic interstitial cystitis 11/09/2011   Managed by [facility] urology branch in [place] hydrodistention 5 years ago with Dr Claudene in [place], has relief of symptoms until now     Chronic low back pain 05/14/2019   Chronic migraine 02/25/2016   Per Dr Ines review in notes from [facility] from September 2015. Showed total headache days last month 18. Severe headache days 7 days. Moderate headache days 5 days. Mild headache days last month sick days. Days without headache last month 10 days. Symptoms associated with photophobia,  phonophobia, osmophobia, neck pain, dizziness, jaw pain, nasal congestion, vision disturbances, tingling and numbness, weakness and worsening with activity. Each headache attack last 3 hours depending on treatment in severity. Left side, the right side, easier side, the frontal area in the back of the head. Characterized as throbbing, pressure, tightness, squeezing, stabbing and burning    Chronic migraine without aura 05/15/2013    Dr Ines [facility]   Chronic tonsillitis 01/27/2019   DDD (degenerative disc disease), cervical 11/18/2018   DDD (degenerative disc disease), lumbar    Depression    Disc displacement, lumbar    Dysphagia 10/11/2021   Dysrhythmia    seen by dr Blanca- not a problem since she has been on Bystolic    Epiretinal membrane (ERM) of left eye 10/17/2021   Periodic ophthalmological monitoring     Episodic cluster headache, not intractable 03/06/2017   Essential hypertension 05/15/2013   Family history of adverse reaction to anesthesia    Brother- N/V   Family history of premature CAD 05/15/2013   Fibromyalgia 10/01/2013   Management by Dr GORMAN. Devashwar (Rheum)     Fibromyalgia syndrome 07/01/2015   Management by Dr GORMAN. Devashwar (Rheum)    GERD (gastroesophageal reflux disease) 12/16/2014   H/O seasonal allergies    Hallux rigidus of right foot 09/25/2022   Hammer toe    Left great toe   Hashimoto's thyroiditis    Per patient, diagnosed by Dr. Sharyne Pacini   Hearing loss of  both ears 07/27/2015   mild to borderline moderate low frequency hearing loss improving to within normal limits bilaterally on audiology testing at [facility] in November 2016.     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   Hyperlipidemia 1998   Hypokalemia due to excessive gastrointestinal loss of potassium 07/28/2019   Hypothyroidism    Iliopsoas bursitis of left hip 05/08/2022   Impairment of balance 02/2015   Consequent of postconcussive syndrome   Injury of triangular fibrocartilage complex (TFCC) 02/25/2019   Injury of triangular fibrocartilage complex of left wrist 02/25/2019   Dx 02/25/19 Prentice Pagan IV MD ([facility])   Insulin  resistance 07/04/2017   Internal hemorrhoid 01/28/2021   Internal hemorrhoid seen on colonoscopy 10/2020 REGINOLD Sol MD [facility])   Interstitial cystitis    Irritable bowel syndrome with diarrhea 04/03/2016   Keratoconjunctivitis sicca 11/18/2018   (+) ANA     Left ventricular hypertrophy, mild 02/25/2016   ECHOcardiogram report 06/17/15 showing EF55-60%, mild LVH and G1DD    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HCC) 03/10/2018   [facility]   Low serum vitamin B12 01/10/2023   Lumbar facet joint pain    Meibomian gland dysfunction (MGD) of both eyes 10/17/2021   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  Added automatically from request for surgery [id number]     Formatting of this note might be different from the original.  Added automatically from request for surgery [id number]     Obesity (BMI 30.0-34.9) 01/29/2017   Odynophagia 12/20/2021   OSA (obstructive sleep apnea) 12/27/2022   Osteoarthritis of acromioclavicular joint 02/22/2021    Osteoarthritis of left hip 11/01/2015   MRI order by Dr Ernie (ortho) 10/2015 showed significant arthritis of left hip joint with cystic changes in femoral head c/w osteoarthritis   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, lumbar region 10/30/2011   Other insomnia 11/09/2016   Overweight    Pain in joint of left shoulder 11/09/2016   Pain in joint, multiple sites 11/18/2018   Palpitations 05/15/2013   [facility] manages   Perianal candidiasis 12/20/2021   Periodic limb movement sleep disorder 03/28/2017   Perirectal cyst 05/07/2016   Plantar fasciitis of right foot 11/10/2022   Poison ivy dermatitis 03/24/2021   PONV (postoperative nausea and vomiting)    Positive ANA (antinuclear antibody) 11/18/2018   Post concussion syndrome 06/06/2015   Post concussive syndrome 07/14/2015   Ms Moynahan's post-concussive syndrome manifesting in vertigo and headache, mood changes, poor balance, dizziness, and decreased concentration per Dr Ines at [facility].    Posterior vitreous detachment of right eye 2014   Premature menopause 12/20/2021   Primary osteoarthritis of left knee 06/20/2018   Psychophysiological insomnia 11/07/2022   Pure hypercholesterolemia  05/15/2013   Rectal abscess 05/10/2021   S/P left THA, AA 07/25/2016   S/P revision of total hip 04/10/2018   S/P tendon repair 01/23/2024   Sacroiliac inflammation (HCC) 08/18/2021   Severe obstructive sleep apnea-hypopnea syndrome 12/22/2022   Shingles    Shortness of breath dyspnea    with exertion   Sicca syndrome (HCC) 11/18/2018   (+) ANA   Sjogren syndrome (HCC) 01/27/2019   Sjogren's syndrome (HCC) 01/27/2019   Sleep walking and eating 03/06/2017   Snoring 03/06/2017   Spondylosis of lumbar region without myelopathy or radiculopathy 10/30/2011   Status post total hip replacement, left 09/20/2017   TFC (triangular fibrocartilage complex) injury 02/25/2019   Thyroid  nodule 08/11/2009   Findings:  The thyroid  gland is within normal limits in size.  The gland is diffusely inhomogeneous. A small solid nodule is noted in the lower pole  medially on the right of 7 x 6 x 8 mm. A small solid nodule is noted inferiorly on the left of 3 x 3 x 4 mm.  IMPRESSION:  The thyroid  gland is within normal limits in size with only small solid nodules present, the largest of only 8 mm in diameter on the right.     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    Trochanteric bursitis, right hip 04/26/2020   Tubular adenoma of colon 01/28/2021   Colonoscopy screening 4 mm tubular Adenoma polyp Marilu Sol, MD [facility])   Tubular adenoma of colon 01/28/2021   Colonoscopy screening 4 mm tubular Adenoma polyp Marilu Sol, MD [facility])   Vasomotor symptoms due to menopause 04/19/2017   Vertigo 03/19/2023   Vitamin D  deficiency 05/08/2017   Vulvitis 07/22/2020   Yeast vaginitis 07/11/2019   Past Surgical History:  Procedure Laterality Date   arthroscopy Left 01/2022   with SAD and DCR, K. Supple MD   Bladder dilitation     x 3   BREAST BIOPSY Right 2011   Benign histology   CARDIOVASCULAR STRESS TEST  2000   Unremarkable per pt report   CARPOMETACARPAL JOINT ARTHROTOMY Right 2011   COLONOSCOPY     COLONOSCOPY WITH PROPOFOL  N/A 04/21/2015   Procedure: COLONOSCOPY WITH PROPOFOL ;  Surgeon: Belvie Just, MD;  Location: [facility] ENDOSCOPY;  Service: Endoscopy;  Laterality: N/A;   CYSTOSCOPY W/ DILATION OF BLADDER N/A    EPIDURAL BLOCK INJECTION Left 04/12/2016   Left Medial Nerve Block and Left L5 ramus block, Dr Charlie Dolores    EPIDURAL BLOCK INJECTION  03/21/2016   Left L3-4 medial branch block and Left L5 & dorsal ramus block    EPIDURAL BLOCK INJECTION N/A 10/25/2016   Charlie Dolores, MD. Lumbar medial branch block   EPIDURAL BLOCK INJECTION N/A 02/09/2017   Charlie Dolores, MD.  Bilateral L3/4 medial branch block, bilateral L5 dorsal ramus  block   EPIDURAL BLOCK INJECTION N/A 07/04/2017   Charlie Dolores, MD   EXCISION MORTON'S NEUROMA Right 05/03/2023   Procedure: EXCISION MORTON'S NEUROMA;  Surgeon: Kit Rush, MD;  Location: [facility];  Service: Orthopedics;  Laterality: Right;  general, local by surgeon 60 MIN   FECAL TRANSPLANT  04/21/2015   Procedure: FECAL TRANSPLANT;  Surgeon: Belvie Just, MD;  Location[facility] ENDOSCOPY;  Service: Endoscopy;;   HIP ADDUCTOR TENOTOMY  01/2024   HIP ARTHROPLASTY Left    HIP ARTHROSCOPY Left 03/06/2018   Left hip arthroplasty, redo for loose hip arthroplasty. Procedure at [facility]   INJECTION HIP INTRA ARTICULAR Left 11/2015   for OA by Dr Charlie Bonner REDBIRD IMPLANT PLACEMENT  04/2021   Norleen Sharps MD ([facility])   INTERSTIM IMPLANT REVISION N/A 09/2021   Norleen Sharps MD ([facility])   LIGAMENT REPAIR Right 05/03/2023   Procedure: COLLATERAL LIGAMENT REPAIR;  Surgeon: Kit Norleen, MD;  Location: [facility];  Service: Orthopedics;  Laterality: Right;  general, local by surgeon 60 MIN   OTHER SURGICAL HISTORY Left 2016   Left L3/L4 medial nerve block and Left L5 Dorsal Ramus block Dr FABIENE Bonner   TOTAL HIP ARTHROPLASTY Left 07/25/2016   Procedure: LEFT TOTAL HIP ARTHROPLASTY ANTERIOR APPROACH;  Surgeon: Donnice Car, MD;  Location: [facility];  Service: Orthopedics;  Laterality: Left;   WEIL OSTEOTOMY Right 05/03/2023   Procedure: RIGHT 2ND WEIL OSTEOTOMY;  Surgeon: Kit Norleen, MD;  Location: [facility];  Service: Orthopedics;  Laterality: Right;  general, local by surgeon 60 MIN   Patient Active Problem List   Diagnosis Date Noted   Chronic pain of left upper extremity 02/19/2024   Arthritis of carpometacarpal [facility]) joint of left thumb 09/07/2023   Low serum vitamin B12 01/10/2023   OSA (obstructive sleep apnea) 12/27/2022   Severe obstructive sleep apnea-hypopnea syndrome 12/22/2022   Hyperglycemia  12/11/2022   SOBOE (shortness of breath on exertion) 12/11/2022   Other fatigue 12/11/2022   Sleep related headaches 11/07/2022   Intractable episodic cluster headache 11/07/2022   Psychophysiological insomnia 11/07/2022   Hallux rigidus of right foot 09/25/2022   Chronic pain of right knee 05/08/2022   Arthrosis of hand 12/20/2021   History of Clostridioides difficile colitis, required fecal transplantation 12/20/2021   Meibomian gland dysfunction (MGD) of both eyes 10/17/2021   Nuclear sclerosis of both eyes 10/17/2021   Epiretinal membrane (ERM) of left eye 10/17/2021   OAB (overactive bladder) 03/02/2021   Osteoarthritis of acromioclavicular joint 02/22/2021   Hyperhidrosis, scalp, 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   Chronic low back pain 05/14/2019   Sjogren syndrome (HCC) 01/27/2019   DDD (degenerative disc disease), cervical 12/27/2018   Cervical spondylosis 11/18/2018   DDD (degenerative disc disease), lumbar 11/18/2018   Keratoconjunctivitis sicca 11/18/2018   Chronic contact dermatitis 08/06/2018   Primary osteoarthritis of left knee 06/20/2018   Insulin  resistance 07/04/2017   Irritable bowel syndrome with diarrhea 04/03/2016   Left ventricular hypertrophy, mild 02/25/2016   Allergic rhinoconjunctivitis 07/02/2015   History of colonic polyps 07/01/2015   GERD (gastroesophageal reflux disease) 12/16/2014   Fibromyalgia 10/01/2013   Mood disorder (HCC) 10/01/2013   Hyperlipidemia 10/01/2013   Pure hypercholesterolemia 05/15/2013   Chronic migraine without aura 05/15/2013   Benign essential hypertension 10/28/2012   Hypothyroidism 11/09/2011   Chronic interstitial cystitis 11/09/2011    PCP: McDiarmid, Krystal, MD REFERRING PROVIDER: Daniel Norleen, PA-C REFERRING DIAG: s/p tendon repair Z 98.890  THERAPY DIAG:  Muscle weakness (generalized)  Other abnormalities of gait and mobility  Pain in right hip  Rationale for Evaluation and Treatment: Rehabilitation  ONSET DATE: 01/23/24    SUBJECTIVE:  SUBJECTIVE STATEMENT: The patient reports Patient reports mostly feeling pain about the Rt QL. She was sore in the hip after last session, but soreness was gone yesterday. No pain in the hip currently.   MD NOTE:  In physical therapy will work on abductor strengthening and gait training. Will work on balance. Will wean off of a walker on to a cane. Will stay on  a cane until is walking without a limp.   PERTINENT HISTORY: HTN, hypercholesterolemia, fibromyalgia, migraines, arthritis, concussion PAIN:  Are you having pain? Yes: NPRS scale: 3 Pain location: Rt QL,low back Pain description: achy and sore Aggravating factors: walking Relieving factors: none--mild discomfort  PRECAUTIONS: Other: The patient will be TDWB x 6 weeks, Abductor hip precautions x 6 weeks to the right side.  WEIGHT BEARING RESTRICTIONS: Yes initially TDWB x 6 weeks (she is beyond that per surgical date)  FALLS:  Has patient fallen in last 6 months? No  LIVING ENVIRONMENT: Lives with: lives with their spouse Lives in: House/apartment Stairs: Yes: Internal: 12-14 steps; on right going up Has following equipment at home: Single point cane  PATIENT GOALS: No pain, no cane  NEXT MD VISIT: early September  OBJECTIVE:  Note: Objective measures were completed at Evaluation unless otherwise noted.  PATIENT SURVEYS:  LEFS : 47.5%  COGNITION: Overall cognitive status: Within functional limits for tasks assessed     SENSATION: WFL  POSTURE: No Significant postural limitations  PALPATION: Mild soreness in lateral hip  LOWER EXTREMITY ROM: AROM to 90 degrees hip flexion Passive ROM Right eval Left eval Right 03/27/24 Right 04/07/24  Hip flexion 95  In quadriped 120 deg Can sit into child's pose  Hip extension      Hip abduction 35     Hip adduction      Hip internal rotation 30     Hip external rotation 30     Knee flexion      Knee extension      Ankle dorsiflexion      Ankle  plantarflexion      Ankle inversion      Ankle eversion       (Blank rows = not tested)  LOWER EXTREMITY MMT: did not test due to post surgical status-- will assess as we initiate strengthening progression in PT MMT Right eval Left eval  Hip flexion    Hip extension    Hip abduction    Hip adduction    Hip internal rotation    Hip external rotation    Knee flexion    Knee extension    Ankle dorsiflexion    Ankle plantarflexion    Ankle inversion    Ankle eversion     (Blank rows = not tested)  BALANCE: R single limb x 2 seconds  L single limb x 10 seconds  GAIT: Distance walked: 150 ft Assistive device utilized: Single point cane and None Level of assistance: Modified independence Comments: 3.2 ft/sec-- patient does start off without a limp and after 100 ft has a R antalgic gait pattern *PT and patient discussed still needing the cane when outdoors and in the evening when fatigued   [facility] Adult PT Treatment:                                                DATE: 04/07/24 Therapeutic Exercise: Quadriped Rocking anterior/posterior Wag the dog R and L for QL stretching Child's pose Standing Hip IR/ER on the stool x 2 reps x 10  Sidelying Hip abduction x 10 reps Small arcs over yoga block x 10 reps Supine Bent knee fallouts x 10 reps SLR partial range x 10 reps, painful after performing Manual Therapy: QL STM in sidelying Trunk rotation contra to shoulder rotation for transverse rotation  with cues Neuromuscular re-ed: Marching slow pace with core engagement Single limb standing Therapeutic Activity: Treadmill x 3.5 minutes at 1.2-1.5 mph at 1% incline-- due to driveway at slight incline Sit<>stand x 10 reps Gait: Wide leg walking encouraging wider base of support with intention Backwards walking   [facility] Adult PT Treatment:                                                DATE: 04/02/24 Therapeutic Exercise: Open book x 10  Childs pose 2 x 30 sec  Hip IR/ER on  stool 2  x10  Standing march x 10 Manual Therapy: STM Rt QL, lumbar paraspinals Neuromuscular re-ed: Clamshells yellow band 2 x 10  SLR 2 x 10; partial range Therapeutic Activity: Treadmill walking incline level 2, speed 1.1 x 5 minutes  Leg press 2 x 10 @ 45 lbs    [facility] Adult PT Treatment:                                                DATE: 03/31/24 Therapeutic Exercise: Open book x 10  LAQ 2 x 10 @ 2 lbs  Updated HEP  Neuromuscular re-ed: Supine TA march yellow band 2 x 10  Hooklying hip abduction (single activation) 2 x 10; yellow band  Clamshells 2 x 10; 1 lb  Prone hip extension 2 x 10  Hip bridge with adduction ball squeeze x 7; d/c due to lateral hip pain    [facility] Adult PT Treatment:                                                DATE: 03/27/24 Therapeutic Exercise: Supine Hip extension isometrics into a yoga ball x 10 reps with 5 second holds Hooklying isometric abduction x 5 second holds x 6 reps Using deflated yoga ball to roll out sacrum in supine Quadriped Rocking anterior/posterior Sidelying  Hip abduction x 10 reps SLR Neuromuscular re-ed: Single leg standing R and L-- PT corrected L hip adduction and IR during R leg stance Gait: Ambulation x 240 ft without antalgic hip pattern Patient does continue with narrow base of support-- we discussed-- if she abducts legs to 2-4 width during gait she gets R QL discomfort Discussed her orthotics, shoewear    PATIENT EDUCATION:  Education details: HEP review Person educated: Patient Education method: Explanation Education comprehension: verbalized understanding  HOME EXERCISE PROGRAM: Access Code: [facility] URL: [email or web address] Date: 03/31/2024 Prepared by: Lucie Meeter  Program Notes n/a  Exercises - Quadruped Rocking Backward  - 1 x daily - 5 x weekly - 1 sets - 10 reps - Standing Hip Extension with Counter Support  - 1 x daily - 5 x weekly - 1 sets - 10 reps - Standing Hip  Abduction with Counter Support  - 1 x daily - 5 x weekly - 1 sets - 10 reps - Standing Shoulder and Trunk Flexion at Table  - 1 x daily - 5 x weekly - 1 sets - 2 reps - 20 seconds hold - Towel Scrunches  - 1 x daily - 4-5 x  weekly - 1 sets - 10 reps - Seated Plantar Fascia Mobilization with Small Ball  - 1 x daily - 4-5 x weekly - 1 sets - 10 reps - Seated Arch Lifts  - 1 x daily - 4-5 x weekly - 1 sets - 10 reps - Hooklying Isometric Clamshell  - 1 x daily - 5 x weekly - 2 sets - 10 reps - Supine March with Resistance Band  - 1 x daily - 5 x weekly - 2 sets - 10 reps - Clamshell  - 1 x daily - 5 x weekly - 2 sets - 10 reps - Prone Hip Extension  - 1 x daily - 5 x weekly - 2 sets - 10 reps  ASSESSMENT:  CLINICAL IMPRESSION: The patient is progressing well with hip strengthening. She is continuing with an antalgic gait pattern that appears to be driven from foot pain versus hip pain. She loads into heel strike to mid stance and as weight transitions anteriorly she has limping to avoid anterior foot pressure. Patient is scheduled to see her foot doctor tomorrow. PT has encouraged continued HEP with 3 activities directed at ROM and mobility of the foot. She is getting some increase in anterior hip pain --- PT to assess-- may wait to further progress SLR, treadmill, and leg press based on increased soreness after last week.  EVAL: Patient is a 68 y.o. female who was seen today for physical therapy evaluation and treatment s/p R glut med repair. She is 6.5 weeks post op today and cleared for WBAT and to begin strengthening. PT initiated HEP today for gentle ROM, glut activations, and to also address R QL pain. Patient feels that cane use is flaring other pain including L 1st MCP joint, L shoulder, and R QL. PT discussed the rationale for cane use discussing goal to normalize gait and avoid an antalgic pattern, therefore, patient to use when in the community and in the evening if she is fatigued. PT to  address deficits and promote return to prior functional status.   OBJECTIVE IMPAIRMENTS: Abnormal gait, decreased activity tolerance, decreased balance, decreased mobility, difficulty walking, decreased ROM, decreased strength, increased fascial restrictions, impaired flexibility, and pain.   GOALS: Goals reviewed with patient? Yes  SHORT TERM GOALS: Target date: 04/16/24  The patient will be indep with initial HEP Baseline: initiated at eval Goal status: INITIAL  2.  The patient will improve LEFS by 8% to demonstrate improved functional abilities. Baseline:  47.5% Goal status: INITIAL  3.   The patient will  demonstrate normal gait pattern with no evidence of antalgic pattern x 500 ft to d/c cane for community distances. Baseline:  Gets antalgic pattern after 100 ft in clinic Goal status: INITIAL  4.  The patient will maintain single leg standing R side x 8 seconds to demo good hip stability for functional loading. Baseline:  2 seconds, L is 10 seconds. Goal status: MET on 03/27/24 10 seconds each side  LONG TERM GOALS: Target date: 05/17/24  The patient will be indep with progression of HEP. Baseline:  initiated at eval Goal status: INITIAL  2.  The patient will improve LEFS by 15% to demonstrate improved functional abilities. Baseline: 47.5% Goal status: INITIAL  3.  The patient will report no pain in R quadratus lumborum region demonstrating reduced compensatory strategies with gait. Baseline:  Is using QL for hip hike and has pain at eval Goal status: INITIAL  4.  The patient will be able to negotiate  12 steps with one rail and reciprocal pattern mod indep. Baseline:  has done steps 1x since d/c home Goal status: INITIAL  5.  The patient will demo improved R LE strength demonstrating SLR x 10 reps. Baseline:   to assess MMT Goal status: INITIAL  PLAN:  PT FREQUENCY: 2x/week  PT DURATION: 8 weeks  PLANNED INTERVENTIONS: [id number]- PT Re-evaluation, 97750- Physical  Performance Testing, 97110-Therapeutic exercises, 97530- Therapeutic activity, [id number]- Neuromuscular re-education, 97535- Self Care, [id number]- Manual therapy, [phone]- Gait training, [phone]- Aquatic Therapy, [phone]- Electrical stimulation (unattended), [phone] (1-2 muscles), [phone] (3+ muscles)- Dry Needling, Patient/Family education, Balance training, Stair training, Taping, Joint mobilization, Cryotherapy, and Moist heat  PLAN FOR NEXT SESSION: Progress HEP for R hip strengthening, normalize gait pattern without device, work on QL lengthening due to pain, work on stairs and R LE stance control.  *anterior tibialis strengthening (due to dec'd eccentric control), treadmill working on incline for negotiating driveway CHECK GOALS by 8/6 (STGs)  Zuzu Befort, PT 04/07/24 4:20 PM

## 2024-04-08 ENCOUNTER — Ambulatory Visit (INDEPENDENT_AMBULATORY_CARE_PROVIDER_SITE_OTHER): Admitting: Neurology

## 2024-04-08 DIAGNOSIS — G43719 Chronic migraine without aura, intractable, without status migrainosus: Secondary | ICD-10-CM

## 2024-04-08 DIAGNOSIS — G4733 Obstructive sleep apnea (adult) (pediatric): Secondary | ICD-10-CM | POA: Diagnosis not present

## 2024-04-08 DIAGNOSIS — M47812 Spondylosis without myelopathy or radiculopathy, cervical region: Secondary | ICD-10-CM

## 2024-04-08 DIAGNOSIS — M797 Fibromyalgia: Secondary | ICD-10-CM

## 2024-04-09 ENCOUNTER — Encounter: Admitting: Rehabilitative and Restorative Service Providers"

## 2024-04-09 NOTE — Progress Notes (Signed)
 [facility]  Molly Giles 68 year old female [birth year]   HOME SLEEP TEST REPORT ( by Watch PAT)   STUDY DATE:  04-09-2024    ORDERING CLINICIAN: [name], NP  REFERRING CLINICIAN: [name], MD Molly Giles    CLINICAL INFORMATION/HISTORY: patient with BMI of  26.7 , down from 31, history of chronic Migraines, cervical spondylosis. OSA on CPAP. 03/26/24 ALL: Molly Giles returns for follow up for migraines and OSA on CPAP. She was last seen via Mychart 11/2023 and we increased Vyepti dose to 300mg  q12 weeks. Since, she reports headaches have improved. She is having about 10-11 headache days. About 6-7 are migrainous. Rizatriptan  usually aborts migraine. She has lost 40lbs since 12/2022 on Zepbound .    She has not used CPAP in the past month or so. Prior to that she was using fairly consistently. Average usage less than 2 hours. She did not note any significant improvement in sleep quality or with headache management. She continues to have difficulty tolerating mask. She has tried many and feels the FFM is the best fir for her. She is a restless sleeper and feels the mask moves around during the night.   Tried and failed: Emgality  (taking now), propranolol, topiramate , nortriptyline , venlafaxine, Ajovy  (ineffective), cant take Amovig (latex allergy) Celexa  (on now), Namenda , Botox (ineffective, droopy eyes), Imitrex , eletriptan , rizatriptan , meloxicam, Cambia, Toradol , prednisone  (caused sunburn feeling), Ubrelvy  and Nurtec.     Epworth sleepiness score: x/24.   BMI: 26.7 kg/m   Neck Circumference: NA   FINDINGS:   Sleep Summary:   Total Recording Time (hours, min):     9 hours 16 minutes   Total Sleep Time (hours, min):           7 hours 26 minutes      Percent REM (%):      12%                                  Respiratory Indices:   Calculated pAHI (per CMS guideline):.  27.2/h   ( if he would have followed CMS guidelines, the AHI would have  been 43/h).                    REM pAHI:     7.3/h                                            NREM pAHI:    30.2/h                          Positional AHI: The patient slept exclusively in supine position.  Snoring: Mean volume of 45 dB was reached snoring was present for 63% of total sleep time.                                              Oxygen Saturation Statistics:   Oxygen Saturation (%) Mean:   93%             O2 Saturation Range (%):    Between a nadir at 74%  and a maximal saturation of 98%.                                   O2 Saturation (minutes) <89%:    1.5 minutes       Pulse Rate Statistics:   Pulse Mean (bpm):   68 bpm              Pulse Range:   Between 52 and 97 bpm, heart rate is provided without data regarding cardiac rhythm.              IMPRESSION:  This HST confirms the presence of still moderate to severe obstructive sleep apnea.  The device did not recognize any central apneas but the recording provided a highly unusual distribution of apneas between REM and non-REM sleep.    RECOMMENDATION: I would like for the patient to continue CPAP but based on her report that she does not feel a more refreshing or improved quality of sleep I am concerned that there may be central apneas.  This would be suspected by the high non-REM sleep apnea count.  There was no significant hypoxia noted that could have explained nighttime headaches or morning headaches.  I  recommend an in-lab titration based on the contradictory data that this home sleep test has given us .  Please provide a sleep aid for the patient so that the attending sleep technologist can concentrate on the apnea itself.    General Advise:   Any patient should be cautioned not to drive, work at heights, or operate dangerous or heavy equipment when tired or sleepy.   Review of good sleep hygiene measures is accessible to any sleep clinic patient and can be reiterated through online material- I we recommend  the Guide to better Sleep   by the NIH.   Weight loss and Core Strength improvement is recommended for individuals with low muscle tone and/ or a BMI over 32.  The referring physician will be notified of the test results.       INTERPRETING PHYSICIAN:   [name], MD  [name] and [facility] Sleep Board certified by The [employer] and Diplomate of the [employer]. Board certified In Neurology through the ABPN, Fellow of the [employer] Neu[employer].

## 2024-04-10 ENCOUNTER — Ambulatory Visit: Admitting: Rehabilitative and Restorative Service Providers"

## 2024-04-10 DIAGNOSIS — R2689 Other abnormalities of gait and mobility: Secondary | ICD-10-CM

## 2024-04-10 DIAGNOSIS — M6281 Muscle weakness (generalized): Secondary | ICD-10-CM

## 2024-04-10 DIAGNOSIS — M25551 Pain in right hip: Secondary | ICD-10-CM

## 2024-04-10 NOTE — Therapy (Signed)
 OUTPATIENT PHYSICAL THERAPY LOWER EXTREMITY TREATMENT   Patient Name: Molly Giles MRN: [id number] DOB:[birth year], 68 y.o., female Today's Date: 04/10/2024  END OF SESSION:  PT End of Session - 04/10/24 1320     Visit Number 8    Number of Visits 16    Date for PT Re-Evaluation 04/16/24    Authorization Type medicare and BCBS supplemental    Progress Note Due on Visit 10    PT Start Time 1319    PT Stop Time 1400    PT Time Calculation (min) 41 min    Activity Tolerance Patient tolerated treatment well    Behavior During Therapy WFL for tasks assessed/performed          Past Medical History:  Diagnosis Date   Abdominal bloating 05/10/2021   Abdominal discomfort, generalized 05/07/2016   Acquired hallux rigidus of right foot 09/15/2022   Acute gastritis 11/19/2020   Allergic rhinoconjunctivitis 07/02/2015   Anal fissure 11/19/2020   Anterior to posterior tear of superior glenoid labrum of left shoulder 02/22/2021   Arthritis    Arthritis of carpometacarpal (CMC) joint of left thumb 09/07/2023   Aseptic loosening of prosthetic hip, initial encounter (HCC) 11/29/2017   Benign essential hypertension 10/28/2012   Benign positional vertigo 04/2015   Responded well to [facility]   Bruxism (teeth grinding)    Cervical spondylosis 11/18/2018   Chronic contact dermatitis 08/06/2018   Per allergist Dr Almarie Scala.    Chronic interstitial cystitis 11/09/2011   Managed by [facility] urology branch in [place] hydrodistention 5 years ago with Dr Claudene in [place], has relief of symptoms until now     Chronic low back pain 05/14/2019   Chronic migraine 02/25/2016   Per Dr Ines review in notes from [facility] from September 2015. Showed total headache days last month 18. Severe headache days 7 days. Moderate headache days 5 days. Mild headache days last month sick days. Days without headache last month 10 days. Symptoms associated with photophobia, phonophobia,  osmophobia, neck pain, dizziness, jaw pain, nasal congestion, vision disturbances, tingling and numbness, weakness and worsening with activity. Each headache attack last 3 hours depending on treatment in severity. Left side, the right side, easier side, the frontal area in the back of the head. Characterized as throbbing, pressure, tightness, squeezing, stabbing and burning    Chronic migraine without aura 05/15/2013    Dr Ines [facility]   Chronic tonsillitis 01/27/2019   DDD (degenerative disc disease), cervical 11/18/2018   DDD (degenerative disc disease), lumbar    Depression    Disc displacement, lumbar    Dysphagia 10/11/2021   Dysrhythmia    seen by dr Blanca- not a problem since she has been on Bystolic    Epiretinal membrane (ERM) of left eye 10/17/2021   Periodic ophthalmological monitoring     Episodic cluster headache, not intractable 03/06/2017   Essential hypertension 05/15/2013   Family history of adverse reaction to anesthesia    Brother- N/V   Family history of premature CAD 05/15/2013   Fibromyalgia 10/01/2013   Management by Dr GORMAN. Devashwar (Rheum)     Fibromyalgia syndrome 07/01/2015   Management by Dr GORMAN. Devashwar ([facility]he[facility])    GERD (gastroesophageal reflux disease) 12/16/2014   H/O seasonal allergies    Hallux rigidus of right foot 09/25/2022   Hammer toe    Left great toe   Hashimoto's thyroiditis    Per patient, diagnosed by Dr. Sharyne Pacini   Hearing loss of both  ears 07/27/2015   mild to borderline moderate low frequency hearing loss improving to within normal limits bilaterally on audiology testing at [facility] in November 2016.     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   Hyperlipidemia 1998   Hypokalemia due to excessive gastrointestinal loss of potassium 07/28/2019   Hypothyroidism    Iliopsoas bursitis of left hip 05/08/2022   Impairment of balance 02/2015   Consequent of postconcussive syndrome   Injury of triangular fibrocartilage complex (TFCC) 02/25/2019   Injury of triangular fibrocartilage complex of left wrist 02/25/2019   Dx 02/25/19 Prentice Pagan IV MD ([facility])   Insulin  resistance 07/04/2017   Internal hemorrhoid 01/28/2021   Internal hemorrhoid seen on colonoscopy 10/2020 REGINOLD Sol MD [facility])   Interstitial cystitis    Irritable bowel syndrome with diarrhea 04/03/2016   Keratoconjunctivitis sicca 11/18/2018   (+) ANA     Left ventricular hypertrophy, mild 02/25/2016   ECHOcardiogram report 06/17/15 showing EF55-60%, mild LVH and G1DD    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HCC) 03/10/2018   [facility]   Low serum vitamin B12 01/10/2023   Lumbar facet joint pain    Meibomian gland dysfunction (MGD) of both eyes 10/17/2021   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  Added automatically from request for surgery [id number]     Formatting of this note might be different from the original.  Added automatically from request for surgery [id number]     Obesity (BMI 30.0-34.9) 01/29/2017   Odynophagia 12/20/2021   OSA (obstructive sleep apnea) 12/27/2022   Osteoarthritis of acromioclavicular joint 02/22/2021    Osteoarthritis of left hip 11/01/2015   MRI order by Dr Ernie (ortho) 10/2015 showed significant arthritis of left hip joint with cystic changes in femoral head c/w osteoarthritis   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, lumbar region 10/30/2011   Other insomnia 11/09/2016   Overweight    Pain in joint of left shoulder 11/09/2016   Pain in joint, multiple sites 11/18/2018   Palpitations 05/15/2013   [facility] manages   Perianal candidiasis 12/20/2021   Periodic limb movement sleep disorder 03/28/2017   Perirectal cyst 05/07/2016   Plantar fasciitis of right foot 11/10/2022   Poison ivy dermatitis 03/24/2021   PONV (postoperative nausea and vomiting)    Positive ANA (antinuclear antibody) 11/18/2018   Post concussion syndrome 06/06/2015   Post concussive syndrome 07/14/2015   Ms Essex's post-concussive syndrome manifesting in vertigo and headache, mood changes, poor balance, dizziness, and decreased concentration per Dr Ines at [facility].    Posterior vitreous detachment of right eye 2014   Premature menopause 12/20/2021   Primary osteoarthritis of left knee 06/20/2018   Psychophysiological insomnia 11/07/2022   Pure hypercholesterolemia 05/15/2013  Rectal abscess 05/10/2021   S/P left THA, AA 07/25/2016   S/P revision of total hip 04/10/2018   S/P tendon repair 01/23/2024   Sacroiliac inflammation (HCC) 08/18/2021   Severe obstructive sleep apnea-hypopnea syndrome 12/22/2022   Shingles    Shortness of breath dyspnea    with exertion   Sicca syndrome (HCC) 11/18/2018   (+) ANA   Sjogren syndrome (HCC) 01/27/2019   Sjogren's syndrome (HCC) 01/27/2019   Sleep walking and eating 03/06/2017   Snoring 03/06/2017   Spondylosis of lumbar region without myelopathy or radiculopathy 10/30/2011   Status post total hip replacement, left 09/20/2017   TFC (triangular fibrocartilage complex) injury 02/25/2019   Thyroid  nodule 08/11/2009   Findings:  The thyroid  gland is within normal limits in size.  The gland is diffusely inhomogeneous. A small solid nodule is noted in the lower pole  medially on the right of 7 x 6 x 8 mm. A small solid nodule is noted inferiorly on the left of 3 x 3 x 4 mm.  IMPRESSION:  The thyroid  gland is within normal limits in size with only small solid nodules present, the largest of only 8 mm in diameter on the right.     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    Trochanteric bursitis, right hip 04/26/2020   Tubular adenoma of colon 01/28/2021   Colonoscopy screening 4 mm tubular Adenoma polyp Marilu Sol, MD [facility])   Tubular adenoma of colon 01/28/2021   Colonoscopy screening 4 mm tubular Adenoma polyp Marilu Sol, MD [facility])   Vasomotor symptoms due to menopause 04/19/2017   Vertigo 03/19/2023   Vitamin D  deficiency 05/08/2017   Vulvitis 07/22/2020   Yeast vaginitis 07/11/2019   Past Surgical History:  Procedure Laterality Date   arthroscopy Left 01/2022   with SAD and DCR, K. Supple MD   Bladder dilitation     x 3   BREAST BIOPSY Right 2011   Benign histology   CARDIOVASCULAR STRESS TEST  2000   Unremarkable per pt report   CARPOMETACARPAL JOINT ARTHROTOMY Right 2011   COLONOSCOPY     COLONOSCOPY WITH PROPOFOL  N/A 04/21/2015   Procedure: COLONOSCOPY WITH PROPOFOL ;  Surgeon: Belvie Just, MD;  Location: [facility] ENDOSCOPY;  Service: Endoscopy;  Laterality: N/A;   CYSTOSCOPY W/ DILATION OF BLADDER N/A    EPIDURAL BLOCK INJECTION Left 04/12/2016   Left Medial Nerve Block and Left L5 ramus block, Dr Charlie Dolores    EPIDURAL BLOCK INJECTION  03/21/2016   Left L3-4 medial branch block and Left L5 & dorsal ramus block    EPIDURAL BLOCK INJECTION N/A 10/25/2016   Charlie Dolores, MD. Lumbar medial branch block   EPIDURAL BLOCK INJECTION N/A 02/09/2017   Charlie Dolores, MD.  Bilateral L3/4 medial branch block, bilateral L5 dorsal ramus  block   EPIDURAL BLOCK INJECTION N/A 07/04/2017   Charlie Dolores, MD   EXCISION MORTON'S NEUROMA Right 05/03/2023   Procedure: EXCISION MORTON'S NEUROMA;  Surgeon: Kit Rush, MD;  Location: [facility];  Service: Orthopedics;  Laterality: Right;  general, local by surgeon 60 MIN   FECAL TRANSPLANT  04/21/2015   Procedure: FECAL TRANSPLANT;  Surgeon: Belvie Just, MD;  Location: [facility] ENDOSCOPY;  Service: Endoscopy;;   HIP ADDUCTOR TENOTOMY  01/2024   HIP ARTHROPLASTY Left    HIP ARTHROSCOPY Left 03/06/2018   Left hip arthroplasty, redo for loose hip arthroplasty. Procedure at [facility] hospital  INJECTION HIP INTRA ARTICULAR Left 11/2015   for OA by Dr Charlie Bonner REDBIRD IMPLANT PLACEMENT  04/2021   Norleen Sharps MD ([facility])   INTERSTIM IMPLANT REVISION N/A 09/2021   Norleen Sharps MD ([facility])   LIGAMENT REPAIR Right 05/03/2023   Procedure: COLLATERAL LIGAMENT REPAIR;  Surgeon: Kit Norleen, MD;  Location: [facility];  Service: Orthopedics;  Laterality: Right;  general, local by surgeon 60 MIN   OTHER SURGICAL HISTORY Left 2016   Left L3/L4 medial nerve block and Left L5 Dorsal Ramus block Dr FABIENE Bonner   TOTAL HIP ARTHROPLASTY Left 07/25/2016   Procedure: LEFT TOTAL HIP ARTHROPLASTY ANTERIOR APPROACH;  Surgeon: Donnice Car, MD;  Location: [facility];  Service: Orthopedics;  Laterality: Left;   WEIL OSTEOTOMY Right 05/03/2023   Procedure: RIGHT 2ND WEIL OSTEOTOMY;  Surgeon: Kit Norleen, MD;  Location:  SURGERY CENTER;  Service: Orthopedics;  Laterality: Right;  general, local by surgeon 60 MIN   Patient Active Problem List   Diagnosis Date Noted   Chronic pain of left upper extremity 02/19/2024   Arthritis of carpometacarpal [facility]) joint of left thumb 09/07/2023   Low serum vitamin B12 01/10/2023   OSA (obstructive sleep apnea) 12/27/2022   Severe obstructive sleep apnea-hypopnea syndrome 12/22/2022   Hyperglycemia  12/11/2022   SOBOE (shortness of breath on exertion) 12/11/2022   Other fatigue 12/11/2022   Sleep related headaches 11/07/2022   Intractable episodic cluster headache 11/07/2022   Psychophysiological insomnia 11/07/2022   Hallux rigidus of right foot 09/25/2022   Chronic pain of right knee 05/08/2022   Arthrosis of hand 12/20/2021   History of Clostridioides difficile colitis, required fecal transplantation 12/20/2021   Meibomian gland dysfunction (MGD) of both eyes 10/17/2021   Nuclear sclerosis of both eyes 10/17/2021   Epiretinal membrane (ERM) of left eye 10/17/2021   OAB (overactive bladder) 03/02/2021   Osteoarthritis of acromioclavicular joint 02/22/2021   Hyperhidrosis, scalp, 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   Chronic low back pain 05/14/2019   Sjogren syndrome (HCC) 01/27/2019   DDD (degenerative disc disease), cervical 12/27/2018   Cervical spondylosis 11/18/2018   DDD (degenerative disc disease), lumbar 11/18/2018   Keratoconjunctivitis sicca 11/18/2018   Chronic contact dermatitis 08/06/2018   Primary osteoarthritis of left knee 06/20/2018   Insulin  resistance 07/04/2017   Irritable bowel syndrome with diarrhea 04/03/2016   Left ventricular hypertrophy, mild 02/25/2016   Allergic rhinoconjunctivitis 07/02/2015   History of colonic polyps 07/01/2015   GERD (gastroesophageal reflux disease) 12/16/2014   Fibromyalgia 10/01/2013   Mood disorder (HCC) 10/01/2013   Hyperlipidemia 10/01/2013   Pure hypercholesterolemia 05/15/2013   Chronic migraine without aura 05/15/2013   Benign essential hypertension 10/28/2012   Hypothyroidism 11/09/2011   Chronic interstitial cystitis 11/09/2011    PCP: McDiarmid, Krystal, MD REFERRING PROVIDER: Daniel Norleen, PA-C REFERRING DIAG: s/p tendon repair Z 98.890  THERAPY DIAG:  Muscle weakness (generalized)  Other abnormalities of gait and mobility  Pain in right hip  Rationale for Evaluation and Treatment: Rehabilitation  ONSET DATE: 01/23/24    SUBJECTIVE:  SUBJECTIVE STATEMENT: The patient reports she saw her podiatrist yesterday. She may need further procedures on the foot. She feels improvement when she  She reports that she continues with pain in R QL region and notes difficulty standing on R leg and lifting the L leg.  MD NOTE:  In physical therapy will work on abductor strengthening and gait training. Will work on balance. Will wean off of  a walker on to a cane. Will stay on a cane until is walking without a limp.   PERTINENT HISTORY: HTN, hypercholesterolemia, fibromyalgia, migraines, arthritis, concussion PAIN:  Are you having pain? Yes: NPRS scale: 3 Pain location: Rt QL,low back Pain description: achy and sore Aggravating factors: walking Relieving factors: none--mild discomfort  PRECAUTIONS: Other: The patient will be TDWB x 6 weeks, Abductor hip precautions x 6 weeks to the right side.  WEIGHT BEARING RESTRICTIONS: Yes initially TDWB x 6 weeks (she is beyond that per surgical date)  FALLS:  Has patient fallen in last 6 months? No  LIVING ENVIRONMENT: Lives with: lives with their spouse Lives in: House/apartment Stairs: Yes: Internal: 12-14 steps; on right going up Has following equipment at home: Single point cane  PATIENT GOALS: No pain, no cane  NEXT MD VISIT: early September  OBJECTIVE:  Note: Objective measures were completed at Evaluation unless otherwise noted.  PATIENT SURVEYS:  LEFS : 47.5%  COGNITION: Overall cognitive status: Within functional limits for tasks assessed     SENSATION: WFL  POSTURE: No Significant postural limitations  PALPATION: Mild soreness in lateral hip  LOWER EXTREMITY ROM: AROM to 90 degrees hip flexion Passive ROM Right eval Left eval Right 03/27/24 Right 04/07/24  Hip flexion 95  In quadriped 120 deg Can sit into child's pose  Hip extension      Hip abduction 35     Hip adduction      Hip internal rotation 30     Hip external rotation 30     Knee  flexion      Knee extension      Ankle dorsiflexion      Ankle plantarflexion      Ankle inversion      Ankle eversion       (Blank rows = not tested)  LOWER EXTREMITY MMT: did not test due to post surgical status-- will assess as we initiate strengthening progression in PT MMT Right eval Left eval  Hip flexion    Hip extension    Hip abduction    Hip adduction    Hip internal rotation    Hip external rotation    Knee flexion    Knee extension    Ankle dorsiflexion    Ankle plantarflexion    Ankle inversion    Ankle eversion     (Blank rows = not tested)  BALANCE: R single limb x 2 seconds  L single limb x 10 seconds  GAIT: Distance walked: 150 ft Assistive device utilized: Single point cane and None Level of assistance: Modified independence Comments: 3.2 ft/sec-- patient does start off without a limp and after 100 ft has a R antalgic gait pattern *PT and patient discussed still needing the cane when outdoors and in the evening when fatigued  [facility] Adult PT Treatment:                                                DATE: 04/10/24 Therapeutic Exercise: Standing Isolated hip IR/ER Quadriped Rocking anterior/posterior Hip extension with cues for core engagement Therapeutic Activity: Squat + sidestepping x 10 reps x 2 sets Step ups anteriorly with contralateral LE marching -- fatigues at 10 reps R and L sides with intermittent UE support Lateral step ups lead to pain in R QL so did not perform  Sit<>stand x 10 reps  without UE support Leg press x 45 # x 20 reps Manual: Trunk rotation and shoulder rotation in opposite directions for transverse rotation to reduce QL pain Gait: Treadmill x 4 minutes at 2% incline x 1.5 mph working on space between feet with gait activities Functional gait with farmer's carry 5# kettle bells in each hand x 160 ft-- note antalgic gait begin  Resisted gait posterior walking 5# at pulley with belt x 8 reps  [facility] Adult PT Treatment:                                                 DATE: 04/07/24 Therapeutic Exercise: Quadriped Rocking anterior/posterior Wag the dog R and L for QL stretching Child's pose Standing Hip IR/ER on the stool x 2 reps x 10  Sidelying Hip abduction x 10 reps Small arcs over yoga block x 10 reps Supine Bent knee fallouts x 10 reps SLR partial range x 10 reps, painful after performing Manual Therapy: QL STM in sidelying Trunk rotation contra to shoulder rotation for transverse rotation with cues Neuromuscular re-ed: Marching slow pace with core engagement Single limb standing Therapeutic Activity: Treadmill x 3.5 minutes at 1.2-1.5 mph at 1% incline-- due to driveway at slight incline Sit<>stand x 10 reps Gait: Wide leg walking encouraging wider base of support with intention Backwards walking   [facility] Adult PT Treatment:                                                DATE: 04/02/24 Therapeutic Exercise: Open book x 10  Childs pose 2 x 30 sec  Hip IR/ER on stool 2  x10  Standing march x 10 Manual Therapy: STM Rt QL, lumbar paraspinals Neuromuscular re-ed: Clamshells yellow band 2 x 10  SLR 2 x 10; partial range Therapeutic Activity: Treadmill walking incline level 2, speed 1.1 x 5 minutes  Leg press 2 x 10 @ 45 lbs    [facility] Adult PT Treatment:                                                DATE: 03/31/24 Therapeutic Exercise: Open book x 10  LAQ 2 x 10 @ 2 lbs  Updated HEP  Neuromuscular re-ed: Supine TA march yellow band 2 x 10  Hooklying hip abduction (single activation) 2 x 10; yellow band  Clamshells 2 x 10; 1 lb  Prone hip extension 2 x 10  Hip bridge with adduction ball squeeze x 7; d/c due to lateral hip pain    [facility] Adult PT Treatment:                                                DATE: 03/27/24 Therapeutic Exercise: Supine Hip extension isometrics into a yoga ball x 10 reps with 5 second holds Hooklying isometric abduction x 5 second holds x 6 reps Using deflated yoga  ball to roll out sacrum in supine Quadriped Rocking anterior/posterior Sidelying  Hip abduction x 10 reps SLR Neuromuscular re-ed: Single leg standing R and L-- PT corrected L hip adduction and IR during R leg stance Gait: Ambulation x 240 ft without antalgic hip pattern Patient does continue with narrow base of support-- we discussed-- if she abducts legs to 2-4 width during gait she gets R QL discomfort Discussed her orthotics, shoewear   PATIENT EDUCATION:  Education details: HEP review Person educated: Patient Education method: Explanation Education comprehension: verbalized understanding  HOME EXERCISE PROGRAM: Access Code: [facility] URL: [email or web address] Date: 04/10/2024 Prepared by: Tawni Ferrier  Program Notes n/a  Exercises - Quadruped Rocking Backward  - 1 x daily - 5 x weekly - 1 sets - 10 reps - Standing Hip Extension with Counter Support  - 1 x daily - 5 x weekly - 1 sets - 10 reps - Standing Hip Abduction with Counter Support  - 1 x daily - 5 x weekly - 1 sets - 10 reps - Side Stepping with Resistance at Ankles  - 1 x daily - 4 x weekly - 2 sets - 10 reps - Standing Shoulder and Trunk Flexion at Table  - 1 x daily - 5 x weekly - 1 sets - 2 reps - 20 seconds hold - Supine March with Resistance Band  - 1 x daily - 5 x weekly - 2 sets - 10 reps - Clamshell  - 1 x daily - 5 x weekly - 2 sets - 10 reps - Prone Hip Extension  - 1 x daily - 5 x weekly - 2 sets - 10 reps - Towel Scrunches  - 1 x daily - 4-5 x weekly - 1 sets - 10 reps - Seated Plantar Fascia Mobilization with Small Ball  - 1 x daily - 4-5 x weekly - 1 sets - 10 reps - Seated Arch Lifts  - 1 x daily - 4-5 x weekly - 1 sets - 10 reps  ASSESSMENT: CLINICAL IMPRESSION: The patient is progressing well with hip strengthening. We have added incline walking (short duration) , leg press with mild soreness, but no hip pain. Patient continues with right QL tightness that PT is working on  strengthening gluteals to reduce hip hike compensations.  EVAL: Patient is a 68 y.o. female who was seen today for physical therapy evaluation and treatment s/p R glut med repair. She is 6.5 weeks post op today and cleared for WBAT and to begin strengthening. PT initiated HEP today for gentle ROM, glut activations, and to also address R QL pain. Patient feels that cane use is flaring other pain including L 1st MCP joint, L shoulder, and R QL. PT discussed the rationale for cane use discussing goal to normalize gait and avoid an antalgic pattern, therefore, patient to use when in the community and in the evening if she is fatigued. PT to address deficits and promote return to prior functional status.   OBJECTIVE IMPAIRMENTS: Abnormal gait, decreased activity tolerance, decreased balance, decreased mobility, difficulty walking, decreased ROM, decreased strength, increased fascial restrictions, impaired flexibility, and pain.   GOALS: Goals reviewed with patient? Yes  SHORT TERM GOALS: Target date: 04/16/24  The patient will be indep with initial HEP Baseline: initiated at eval Goal status: INITIAL  2.  The patient will improve LEFS by 8% to demonstrate improved functional abilities. Baseline:  47.5% Goal status: INITIAL  3.   The patient will  demonstrate normal gait pattern with no evidence of antalgic pattern x 500 ft to d/c cane for community  distances. Baseline:  Gets antalgic pattern after 100 ft in clinic Goal status: INITIAL  4.  The patient will maintain single leg standing R side x 8 seconds to demo good hip stability for functional loading. Baseline:  2 seconds, L is 10 seconds. Goal status: MET on 03/27/24 10 seconds each side  LONG TERM GOALS: Target date: 05/17/24  The patient will be indep with progression of HEP. Baseline:  initiated at eval Goal status: INITIAL  2.  The patient will improve LEFS by 15% to demonstrate improved functional abilities. Baseline: 47.5% Goal  status: INITIAL  3.  The patient will report no pain in R quadratus lumborum region demonstrating reduced compensatory strategies with gait. Baseline:  Is using QL for hip hike and has pain at eval Goal status: INITIAL  4.  The patient will be able to negotiate 12 steps with one rail and reciprocal pattern mod indep. Baseline:  has done steps 1x since d/c home Goal status: INITIAL  5.  The patient will demo improved R LE strength demonstrating SLR x 10 reps. Baseline:   to assess MMT Goal status: INITIAL  PLAN:  PT FREQUENCY: 2x/week  PT DURATION: 8 weeks  PLANNED INTERVENTIONS: [id number]- PT Re-evaluation, 97750- Physical Performance Testing, 97110-Therapeutic exercises, 97530- Therapeutic activity, [id number]- Neuromuscular re-education, 97535- Self Care, [id number]- Manual therapy, [phone]- Gait training, [phone]- Aquatic Therapy, [phone]- Electrical stimulation (unattended), [phone] (1-2 muscles), [phone] (3+ muscles)- Dry Needling, Patient/Family education, Balance training, Stair training, Taping, Joint mobilization, Cryotherapy, and Moist heat  PLAN FOR NEXT SESSION: Progress HEP for R hip strengthening, normalize gait pattern without device, work on QL lengthening due to pain, work on stairs and R LE stance control.  *anterior tibialis strengthening (due to dec'd eccentric control), treadmill working on incline for negotiating driveway CHECK GOALS by 8/6 (STGs)  Genifer Lazenby, PT 04/10/24 1:20 PM

## 2024-04-11 ENCOUNTER — Other Ambulatory Visit: Payer: Self-pay | Admitting: Plastic Surgery

## 2024-04-13 MED ORDER — ALPRAZOLAM 0.5 MG PO TABS: 0.5000 mg | ORAL_TABLET | Freq: Every evening | ORAL | 0 refills | Status: DC | PRN

## 2024-04-13 NOTE — Procedures (Signed)
 [facility]  Molly Giles 68 year old female December 16, 1955   HOME SLEEP TEST REPORT ( by Watch PAT)   STUDY DATE:  04-09-2024    ORDERING CLINICIAN: [name], NP  REFERRING CLINICIAN: [name], MD [name]    CLINICAL INFORMATION/HISTORY: patient with BMI of  26.7 , down from 31, history of chronic Migraines, cervical spondylosis. OSA on CPAP. 03/26/24 ALL: Molly Giles returns for follow up for migraines and OSA on CPAP. She was last seen via Mychart 11/2023 and we increased Vyepti dose to 300mg  q12 weeks. Since, she reports headaches have improved. She is having about 10-11 headache days. About 6-7 are migrainous. Rizatriptan  usually aborts migraine. She has lost 40lbs since 12/2022 on Zepbound .    She has not used CPAP in the past month or so. Prior to that she was using fairly consistently. Average usage less than 2 hours. She did not note any significant improvement in sleep quality or with headache management. She continues to have difficulty tolerating mask. She has tried many and feels the FFM is the best fir for her. She is a restless sleeper and feels the mask moves around during the night.   Tried and failed: Emgality  (taking now), propranolol, topiramate , nortriptyline , venlafaxine, Ajovy  (ineffective), cant take Amovig (latex allergy) Celexa  (on now), Namenda , Botox (ineffective, droopy eyes), Imitrex , eletriptan , rizatriptan , meloxicam, Cambia, Toradol , prednisone  (caused sunburn feeling), Ubrelvy  and Nurtec.     Epworth sleepiness score: x/24.   BMI: 26.7 kg/m   Neck Circumference: NA   FINDINGS:   Sleep Summary:   Total Recording Time (hours, min):     9 hours 16 minutes   Total Sleep Time (hours, min):           7 hours 26 minutes      Percent REM (%):      12%                                  Respiratory Indices:   Calculated pAHI (per CMS guideline):.  27.2/h   ( if he would have followed CMS guidelines, the AHI would have been  43/h).                    REM pAHI:     7.3/h                                            NREM pAHI:    30.2/h                          Positional AHI: The patient slept exclusively in supine position.  Snoring: Mean volume of 45 dB was reached snoring was present for 63% of total sleep time.                                              Oxygen Saturation Statistics:   Oxygen Saturation (%) Mean:   93%             O2 Saturation Range (%):    Between a nadir at 74% and a maximal saturation  of 98%.                                   O2 Saturation (minutes) <89%:    1.5 minutes       Pulse Rate Statistics:   Pulse Mean (bpm):   68 bpm              Pulse Range:   Between 52 and 97 bpm, heart rate is provided without data regarding cardiac rhythm.              IMPRESSION:  This HST confirms the presence of still moderate to severe obstructive sleep apnea.  The device did not recognize any central apneas but the recording provided a highly unusual distribution of apneas between REM and non-REM sleep.    RECOMMENDATION: I would like for the patient to continue CPAP but based on her report that she does not feel a more refreshing or improved quality of sleep I am concerned that there may be central apneas.  This would be suspected by the high non-REM sleep apnea count.  There was no significant hypoxia noted that could have explained nighttime headaches or morning headaches.  I  recommend an in-lab titration based on the contradictory data that this home sleep test has given us .  Please provide a sleep aid for the patient so that the attending sleep technologist can concentrate on the apnea itself.    General Advise:   Any patient should be cautioned not to drive, work at heights, or operate dangerous or heavy equipment when tired or sleepy.   Review of good sleep hygiene measures is accessible to any sleep clinic patient and can be reiterated through online material- I we recommend the  Guide to better Sleep   by the NIH.   Weight loss and Core Strength improvement is recommended for individuals with low muscle tone and/ or a BMI over 32.  The referring physician will be notified of the test results.       INTERPRETING PHYSICIAN:   [name], MD  [name] and [facility] Sleep Board certified by The [employer] and Diplomate of the [employer]. Board certified In Neurology through the ABPN, Fellow of the [employer].

## 2024-04-14 ENCOUNTER — Ambulatory Visit: Payer: Self-pay | Attending: Physician Assistant | Admitting: Rehabilitative and Restorative Service Providers"

## 2024-04-14 ENCOUNTER — Encounter: Payer: Self-pay | Admitting: Rehabilitative and Restorative Service Providers"

## 2024-04-14 ENCOUNTER — Ambulatory Visit (INDEPENDENT_AMBULATORY_CARE_PROVIDER_SITE_OTHER): Admitting: Family Medicine

## 2024-04-14 ENCOUNTER — Ambulatory Visit: Payer: Self-pay | Admitting: *Deleted

## 2024-04-14 DIAGNOSIS — M6281 Muscle weakness (generalized): Secondary | ICD-10-CM | POA: Diagnosis present

## 2024-04-14 DIAGNOSIS — R2689 Other abnormalities of gait and mobility: Secondary | ICD-10-CM | POA: Insufficient documentation

## 2024-04-14 DIAGNOSIS — M25551 Pain in right hip: Secondary | ICD-10-CM | POA: Diagnosis present

## 2024-04-14 NOTE — Therapy (Signed)
 OUTPATIENT PHYSICAL THERAPY LOWER EXTREMITY TREATMENT   Patient Name: Molly Giles MRN: [id number] DOB:[birth year], 68 y.o., female Today's Date: 04/14/2024  END OF SESSION:  PT End of Session - 04/14/24 1200     Visit Number 9    Number of Visits 16    Date for PT Re-Evaluation 04/16/24    Authorization Type medicare and BCBS supplemental    Progress Note Due on Visit 10    PT Start Time 1151    PT Stop Time 1231    PT Time Calculation (min) 40 min    Activity Tolerance Patient tolerated treatment well    Behavior During Therapy [facility] for tasks assessed/performed          Past Medical History:  Diagnosis Date   Abdominal bloating 05/10/2021   Abdominal discomfort, generalized 05/07/2016   Acquired hallux rigidus of right foot 09/15/2022   Acute gastritis 11/19/2020   Allergic rhinoconjunctivitis 07/02/2015   Anal fissure 11/19/2020   Anterior to posterior tear of superior glenoid labrum of left shoulder 02/22/2021   Arthritis    Arthritis of carpometacarpal (CMC) joint of left thumb 09/07/2023   Aseptic loosening of prosthetic hip, initial encounter (HCC) 11/29/2017   Benign essential hypertension 10/28/2012   Benign positional vertigo 04/2015   Responded well to [facility]   Bruxism (teeth grinding)    Cervical spondylosis 11/18/2018   Chronic contact dermatitis 08/06/2018   Per allergist Dr Almarie Scala.    Chronic interstitial cystitis 11/09/2011   Managed by [facility] urology branch in [place] hydrodistention 5 years ago with Dr Claudene in [place], has relief of symptoms until now     Chronic low back pain 05/14/2019   Chronic migraine 02/25/2016   Per Dr Ines review in notes from [facility] from September 2015. Showed total headache days last month 18. Severe headache days 7 days. Moderate headache days 5 days. Mild headache days last month sick days. Days without headache last month 10 days. Symptoms associated with photophobia, phonophobia,  osmophobia, neck pain, dizziness, jaw pain, nasal congestion, vision disturbances, tingling and numbness, weakness and worsening with activity. Each headache attack last 3 hours depending on treatment in severity. Left side, the right side, easier side, the frontal area in the back of the head. Characterized as throbbing, pressure, tightness, squeezing, stabbing and burning    Chronic migraine without aura 05/15/2013    Dr Ines [facility]   Chronic tonsillitis 01/27/2019   DDD (degenerative disc disease), cervical 11/18/2018   DDD (degenerative disc disease), lumbar    Depression    Disc displacement, lumbar    Dysphagia 10/11/2021   Dysrhythmia    seen by dr Blanca- not a problem since she has been on Bystolic    Epiretinal membrane (ERM) of left eye 10/17/2021   Periodic ophthalmological monitoring     Episodic cluster headache, not intractable 03/06/2017   Essential hypertension 05/15/2013   Family history of adverse reaction to anesthesia    Brother- N/V   Family history of premature CAD 05/15/2013   Fibromyalgia 10/01/2013   Management by Dr GORMAN. Devashwar (Rheum)     Fibromyalgia syndrome 07/01/2015   Management by Dr GORMAN. Devashwar ([facility]he[facility])    GERD (gastroesophageal reflux disease) 12/16/2014   H/O seasonal allergies    Hallux rigidus of right foot 09/25/2022   Hammer toe    Left great toe   Hashimoto's thyroiditis    Per patient, diagnosed by Dr. Sharyne Pacini   Hearing loss of both  ears 07/27/2015   mild to borderline moderate low frequency hearing loss improving to within normal limits bilaterally on audiology testing at [facility] in November 2016.     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   Hyperlipidemia 1998   Hypokalemia due to excessive gastrointestinal loss of potassium 07/28/2019   Hypothyroidism    Iliopsoas bursitis of left hip 05/08/2022   Impairment of balance 02/2015   Consequent of postconcussive syndrome   Injury of triangular fibrocartilage complex (TFCC) 02/25/2019   Injury of triangular fibrocartilage complex of left wrist 02/25/2019   Dx 02/25/19 Prentice Pagan IV MD ([facility])   Insulin  resistance 07/04/2017   Internal hemorrhoid 01/28/2021   Internal hemorrhoid seen on colonoscopy 10/2020 REGINOLD Sol MD [facility])   Interstitial cystitis    Irritable bowel syndrome with diarrhea 04/03/2016   Keratoconjunctivitis sicca 11/18/2018   (+) ANA     Left ventricular hypertrophy, mild 02/25/2016   ECHOcardiogram report 06/17/15 showing EF55-60%, mild LVH and G1DD    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HCC) 03/10/2018   [facility]   Low serum vitamin B12 01/10/2023   Lumbar facet joint pain    Meibomian gland dysfunction (MGD) of both eyes 10/17/2021   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  Added automatically from request for surgery [id number]     Formatting of this note might be different from the original.  Added automatically from request for surgery [id number]     Obesity (BMI 30.0-34.9) 01/29/2017   Odynophagia 12/20/2021   OSA (obstructive sleep apnea) 12/27/2022   Osteoarthritis of acromioclavicular joint 02/22/2021    Osteoarthritis of left hip 11/01/2015   MRI order by Dr Ernie (ortho) 10/2015 showed significant arthritis of left hip joint with cystic changes in femoral head c/w osteoarthritis   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, lumbar region 10/30/2011   Other insomnia 11/09/2016   Overweight    Pain in joint of left shoulder 11/09/2016   Pain in joint, multiple sites 11/18/2018   Palpitations 05/15/2013   [facility] manages   Perianal candidiasis 12/20/2021   Periodic limb movement sleep disorder 03/28/2017   Perirectal cyst 05/07/2016   Plantar fasciitis of right foot 11/10/2022   Poison ivy dermatitis 03/24/2021   PONV (postoperative nausea and vomiting)    Positive ANA (antinuclear antibody) 11/18/2018   Post concussion syndrome 06/06/2015   Post concussive syndrome 07/14/2015   Ms Lynch's post-concussive syndrome manifesting in vertigo and headache, mood changes, poor balance, dizziness, and decreased concentration per Dr Ines at [facility].    Posterior vitreous detachment of right eye 2014   Premature menopause 12/20/2021   Primary osteoarthritis of left knee 06/20/2018   Psychophysiological insomnia 11/07/2022   Pure hypercholesterolemia 05/15/2013  Rectal abscess 05/10/2021   S/P left THA, AA 07/25/2016   S/P revision of total hip 04/10/2018   S/P tendon repair 01/23/2024   Sacroiliac inflammation (HCC) 08/18/2021   Severe obstructive sleep apnea-hypopnea syndrome 12/22/2022   Shingles    Shortness of breath dyspnea    with exertion   Sicca syndrome (HCC) 11/18/2018   (+) ANA   Sjogren syndrome (HCC) 01/27/2019   Sjogren's syndrome (HCC) 01/27/2019   Sleep walking and eating 03/06/2017   Snoring 03/06/2017   Spondylosis of lumbar region without myelopathy or radiculopathy 10/30/2011   Status post total hip replacement, left 09/20/2017   TFC (triangular fibrocartilage complex) injury 02/25/2019   Thyroid  nodule 08/11/2009   Findings:  The thyroid  gland is within normal limits in size.  The gland is diffusely inhomogeneous. A small solid nodule is noted in the lower pole  medially on the right of 7 x 6 x 8 mm. A small solid nodule is noted inferiorly on the left of 3 x 3 x 4 mm.  IMPRESSION:  The thyroid  gland is within normal limits in size with only small solid nodules present, the largest of only 8 mm in diameter on the right.     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    Trochanteric bursitis, right hip 04/26/2020   Tubular adenoma of colon 01/28/2021   Colonoscopy screening 4 mm tubular Adenoma polyp Marilu Sol, MD [facility])   Tubular adenoma of colon 01/28/2021   Colonoscopy screening 4 mm tubular Adenoma polyp Marilu Sol, MD [facility])   Vasomotor symptoms due to menopause 04/19/2017   Vertigo 03/19/2023   Vitamin D  deficiency 05/08/2017   Vulvitis 07/22/2020   Yeast vaginitis 07/11/2019   Past Surgical History:  Procedure Laterality Date   arthroscopy Left 01/2022   with SAD and DCR, K. Supple MD   Bladder dilitation     x 3   BREAST BIOPSY Right 2011   Benign histology   CARDIOVASCULAR STRESS TEST  2000   Unremarkable per pt report   CARPOMETACARPAL JOINT ARTHROTOMY Right 2011   COLONOSCOPY     COLONOSCOPY WITH PROPOFOL  N/A 04/21/2015   Procedure: COLONOSCOPY WITH PROPOFOL ;  Surgeon: Belvie Just, MD;  Location: [facility] ENDOSCOPY;  Service: Endoscopy;  Laterality: N/A;   CYSTOSCOPY W/ DILATION OF BLADDER N/A    EPIDURAL BLOCK INJECTION Left 04/12/2016   Left Medial Nerve Block and Left L5 ramus block, Dr Charlie Dolores    EPIDURAL BLOCK INJECTION  03/21/2016   Left L3-4 medial branch block and Left L5 & dorsal ramus block    EPIDURAL BLOCK INJECTION N/A 10/25/2016   Charlie Dolores, MD. Lumbar medial branch block   EPIDURAL BLOCK INJECTION N/A 02/09/2017   Charlie Dolores, MD.  Bilateral L3/4 medial branch block, bilateral L5 dorsal ramus  block   EPIDURAL BLOCK INJECTION N/A 07/04/2017   Charlie Dolores, MD   EXCISION MORTON'S NEUROMA Right 05/03/2023   Procedure: EXCISION MORTON'S NEUROMA;  Surgeon: Kit Rush, MD;  Location:  SURGERY CENTER;  Service: Orthopedics;  Laterality: Right;  general, local by surgeon 60 MIN   FECAL TRANSPLANT  04/21/2015   Procedure: FECAL TRANSPLANT;  Surgeon: Belvie Just, MD;  Location: [facility] ENDOSCOPY;  Service: Endoscopy;;   HIP ADDUCTOR TENOTOMY  01/2024   HIP ARTHROPLASTY Left    HIP ARTHROSCOPY Left 03/06/2018   Left hip arthroplasty, redo for loose hip arthroplasty. Procedure at [facility] hospital  INJECTION HIP INTRA ARTICULAR Left 11/2015   for OA by Dr Charlie Bonner REDBIRD IMPLANT PLACEMENT  04/2021   Norleen Sharps MD ([facility])   INTERSTIM IMPLANT REVISION N/A 09/2021   Norleen Sharps MD ([facility])   LIGAMENT REPAIR Right 05/03/2023   Procedure: COLLATERAL LIGAMENT REPAIR;  Surgeon: Kit Norleen, MD;  Location: [facility];  Service: Orthopedics;  Laterality: Right;  general, local by surgeon 60 MIN   OTHER SURGICAL HISTORY Left 2016   Left L3/L4 medial nerve block and Left L5 Dorsal Ramus block Dr FABIENE Bonner   TOTAL HIP ARTHROPLASTY Left 07/25/2016   Procedure: LEFT TOTAL HIP ARTHROPLASTY ANTERIOR APPROACH;  Surgeon: Donnice Car, MD;  Location: [facility];  Service: Orthopedics;  Laterality: Left;   WEIL OSTEOTOMY Right 05/03/2023   Procedure: RIGHT 2ND WEIL OSTEOTOMY;  Surgeon: Kit Norleen, MD;  Location: [facility];  Service: Orthopedics;  Laterality: Right;  general, local by surgeon 60 MIN   Patient Active Problem List   Diagnosis Date Noted   Chronic pain of left upper extremity 02/19/2024   Arthritis of carpometacarpal [facility]) joint of left thumb 09/07/2023   Low serum vitamin B12 01/10/2023   OSA (obstructive sleep apnea) 12/27/2022   Severe obstructive sleep apnea-hypopnea syndrome 12/22/2022   Hyperglycemia  12/11/2022   SOBOE (shortness of breath on exertion) 12/11/2022   Other fatigue 12/11/2022   Sleep related headaches 11/07/2022   Intractable episodic cluster headache 11/07/2022   Psychophysiological insomnia 11/07/2022   Hallux rigidus of right foot 09/25/2022   Chronic pain of right knee 05/08/2022   Arthrosis of hand 12/20/2021   History of Clostridioides difficile colitis, required fecal transplantation 12/20/2021   Meibomian gland dysfunction (MGD) of both eyes 10/17/2021   Nuclear sclerosis of both eyes 10/17/2021   Epiretinal membrane (ERM) of left eye 10/17/2021   OAB (overactive bladder) 03/02/2021   Osteoarthritis of acromioclavicular joint 02/22/2021   Hyperhidrosis, scalp, 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   Chronic low back pain 05/14/2019   Sjogren syndrome (HCC) 01/27/2019   DDD (degenerative disc disease), cervical 12/27/2018   Cervical spondylosis 11/18/2018   DDD (degenerative disc disease), lumbar 11/18/2018   Keratoconjunctivitis sicca 11/18/2018   Chronic contact dermatitis 08/06/2018   Primary osteoarthritis of left knee 06/20/2018   Insulin  resistance 07/04/2017   Irritable bowel syndrome with diarrhea 04/03/2016   Left ventricular hypertrophy, mild 02/25/2016   Allergic rhinoconjunctivitis 07/02/2015   History of colonic polyps 07/01/2015   GERD (gastroesophageal reflux disease) 12/16/2014   Fibromyalgia 10/01/2013   Mood disorder (HCC) 10/01/2013   Hyperlipidemia 10/01/2013   Pure hypercholesterolemia 05/15/2013   Chronic migraine without aura 05/15/2013   Benign essential hypertension 10/28/2012   Hypothyroidism 11/09/2011   Chronic interstitial cystitis 11/09/2011    PCP: McDiarmid, Krystal, MD REFERRING PROVIDER: Daniel Norleen, PA-C REFERRING DIAG: s/p tendon repair Z 98.890  THERAPY DIAG:  Muscle weakness (generalized)  Other abnormalities of gait and mobility  Pain in right hip  Rationale for Evaluation and Treatment: Rehabilitation  ONSET DATE: 01/23/24    SUBJECTIVE:  SUBJECTIVE STATEMENT: The patient reports she was sore Saturday after last PT session. Soreness lasted 1 day.  MD NOTE:  In physical therapy will work on abductor strengthening and gait training. Will work on balance. Will wean off of a walker on to a cane. Will stay on a cane until is walking without a limp.   PERTINENT HISTORY: HTN, hypercholesterolemia, fibromyalgia, migraines, arthritis, concussion PAIN:  Are  you having pain? Yes: NPRS scale: 3 Pain location: Rt QL,low back Pain description: achy and sore Aggravating factors: walking Relieving factors: none--mild discomfort  PRECAUTIONS: Other: The patient will be TDWB x 6 weeks, Abductor hip precautions x 6 weeks to the right side.  WEIGHT BEARING RESTRICTIONS: Yes initially TDWB x 6 weeks (she is beyond that per surgical date)  FALLS:  Has patient fallen in last 6 months? No  LIVING ENVIRONMENT: Lives with: lives with their spouse Lives in: House/apartment Stairs: Yes: Internal: 12-14 steps; on right going up Has following equipment at home: Single point cane  PATIENT GOALS: No pain, no cane  NEXT MD VISIT: early September  OBJECTIVE:  Note: Objective measures were completed at Evaluation unless otherwise noted.  PATIENT SURVEYS:  LEFS : 47.5%  COGNITION: Overall cognitive status: Within functional limits for tasks assessed     SENSATION: WFL  POSTURE: No Significant postural limitations  PALPATION: Mild soreness in lateral hip  LOWER EXTREMITY ROM: AROM to 90 degrees hip flexion Passive ROM Right eval Left eval Right 03/27/24 Right 04/07/24  Hip flexion 95  In quadriped 120 deg Can sit into child's pose  Hip extension      Hip abduction 35     Hip adduction      Hip internal rotation 30     Hip external rotation 30     Knee flexion      Knee extension      Ankle dorsiflexion      Ankle plantarflexion      Ankle inversion      Ankle eversion       (Blank rows = not  tested)  LOWER EXTREMITY MMT: did not test due to post surgical status-- will assess as we initiate strengthening progression in PT MMT Right eval Left eval  Hip flexion    Hip extension    Hip abduction    Hip adduction    Hip internal rotation    Hip external rotation    Knee flexion    Knee extension    Ankle dorsiflexion    Ankle plantarflexion    Ankle inversion    Ankle eversion     (Blank rows = not tested)  BALANCE: R single limb x 2 seconds  L single limb x 10 seconds  GAIT: Distance walked: 150 ft Assistive device utilized: Single point cane and None Level of assistance: Modified independence Comments: 3.2 ft/sec-- patient does start off without a limp and after 100 ft has a R antalgic gait pattern *PT and patient discussed still needing the cane when outdoors and in the evening when fatigued  [facility] Adult PT Treatment:                                                DATE: 04/14/24 Therapeutic Exercise: Standing IR/ER on stool Quadriped Hip extension x 10 reps Sidelying Open book position x 10 reps Supine SLR x 5 reps x 2 sets Bent knee fallout x 5 reps R Hip adductor stretch hooklying Therapeutic Activity: Standing Step ups anterior x 10 reps Steps ups lateral x 5 reps 4 steps R side (gets QL pain), L sides x 5 reps Leg press 45# x 12 reps 65# x 6 reps Gait: Gait activities x 600 ft with antalgic pattern beginning at 220 ft. Treadmill 2% incline up to 1.5 mph  [facility] Adult PT Treatment:                                                DATE: 04/10/24 Therapeutic Exercise: Standing Isolated hip IR/ER Quadriped Rocking anterior/posterior Hip extension with cues for core engagement Therapeutic Activity: Squat + sidestepping x 10 reps x 2 sets Step ups anteriorly with contralateral LE marching -- fatigues at 10 reps R and L sides with intermittent UE support Lateral step ups lead to pain in R QL so did not perform  Sit<>stand x 10 reps without UE  support Leg press x 45 # x 20 reps Manual: Trunk rotation and shoulder rotation in opposite directions for transverse rotation to reduce QL pain Gait: Treadmill x 4 minutes at 2% incline x 1.5 mph working on space between feet with gait activities Functional gait with farmer's carry 5# kettle bells in each hand x 160 ft-- note antalgic gait begin  Resisted gait posterior walking 5# at pulley with belt x 8 reps  [facility] Adult PT Treatment:                                                DATE: 04/07/24 Therapeutic Exercise: Quadriped Rocking anterior/posterior Wag the dog R and L for QL stretching Child's pose Standing Hip IR/ER on the stool x 2 reps x 10  Sidelying Hip abduction x 10 reps Small arcs over yoga block x 10 reps Supine Bent knee fallouts x 10 reps SLR partial range x 10 reps, painful after performing Manual Therapy: QL STM in sidelying Trunk rotation contra to shoulder rotation for transverse rotation with cues Neuromuscular re-ed: Marching slow pace with core engagement Single limb standing Therapeutic Activity: Treadmill x 3.5 minutes at 1.2-1.5 mph at 1% incline-- due to driveway at slight incline Sit<>stand x 10 reps Gait: Wide leg walking encouraging wider base of support with intention Backwards walking   [facility] Adult PT Treatment:                                                DATE: 04/02/24 Therapeutic Exercise: Open book x 10  Childs pose 2 x 30 sec  Hip IR/ER on stool 2  x10  Standing march x 10 Manual Therapy: STM Rt QL, lumbar paraspinals Neuromuscular re-ed: Clamshells yellow band 2 x 10  SLR 2 x 10; partial range Therapeutic Activity: Treadmill walking incline level 2, speed 1.1 x 5 minutes  Leg press 2 x 10 @ 45 lbs    [facility] Adult PT Treatment:                                                DATE: 03/31/24 Therapeutic Exercise: Open book x 10  LAQ 2 x 10 @ 2 lbs  Updated HEP  Neuromuscular re-ed: Supine TA march yellow band 2 x 10   Hooklying hip abduction (single activation) 2 x 10; yellow band  Clamshells 2 x 10; 1 lb  Prone  hip extension 2 x 10  Hip bridge with adduction ball squeeze x 7; d/c due to lateral hip pain    [facility] Adult PT Treatment:                                                DATE: 03/27/24 Therapeutic Exercise: Supine Hip extension isometrics into a yoga ball x 10 reps with 5 second holds Hooklying isometric abduction x 5 second holds x 6 reps Using deflated yoga ball to roll out sacrum in supine Quadriped Rocking anterior/posterior Sidelying  Hip abduction x 10 reps SLR Neuromuscular re-ed: Single leg standing R and L-- PT corrected L hip adduction and IR during R leg stance Gait: Ambulation x 240 ft without antalgic hip pattern Patient does continue with narrow base of support-- we discussed-- if she abducts legs to 2-4 width during gait she gets R QL discomfort Discussed her orthotics, shoewear   PATIENT EDUCATION:  Education details: HEP review Person educated: Patient Education method: Explanation Education comprehension: verbalized understanding  HOME EXERCISE PROGRAM: Access Code: [facility] URL: [email or web address] Date: 04/10/2024 Prepared by: Tawni Ferrier  Exercises - Quadruped Rocking Backward  - 1 x daily - 5 x weekly - 1 sets - 10 reps - Standing Hip Extension with Counter Support  - 1 x daily - 5 x weekly - 1 sets - 10 reps - Standing Hip Abduction with Counter Support  - 1 x daily - 5 x weekly - 1 sets - 10 reps - Side Stepping with Resistance at Ankles  - 1 x daily - 4 x weekly - 2 sets - 10 reps - Standing Shoulder and Trunk Flexion at Table  - 1 x daily - 5 x weekly - 1 sets - 2 reps - 20 seconds hold - Supine March with Resistance Band  - 1 x daily - 5 x weekly - 2 sets - 10 reps - Clamshell  - 1 x daily - 5 x weekly - 2 sets - 10 reps - Prone Hip Extension  - 1 x daily - 5 x weekly - 2 sets - 10 reps - Towel Scrunches  - 1 x daily - 4-5 x  weekly - 1 sets - 10 reps - Seated Plantar Fascia Mobilization with Small Ball  - 1 x daily - 4-5 x weekly - 1 sets - 10 reps - Seated Arch Lifts  - 1 x daily - 4-5 x weekly - 1 sets - 10 reps  ASSESSMENT: CLINICAL IMPRESSION: The patient has partially met STGs. She does continue with antalgic gait pattern when walking >220 ft. PT to continue to progress to LTGs.   EVAL: Patient is a 68 y.o. female who was seen today for physical therapy evaluation and treatment s/p R glut med repair. She is 6.5 weeks post op today and cleared for WBAT and to begin strengthening. PT initiated HEP today for gentle ROM, glut activations, and to also address R QL pain. Patient feels that cane use is flaring other pain including L 1st MCP joint, L shoulder, and R QL. PT discussed the rationale for cane use discussing goal to normalize gait and avoid an antalgic pattern, therefore, patient to use when in the community and in the evening if she is fatigued. PT to address deficits and promote return to prior functional status.   OBJECTIVE IMPAIRMENTS: Abnormal gait, decreased activity  tolerance, decreased balance, decreased mobility, difficulty walking, decreased ROM, decreased strength, increased fascial restrictions, impaired flexibility, and pain.   GOALS: Goals reviewed with patient? Yes  SHORT TERM GOALS: Target date: 04/16/24  The patient will be indep with initial HEP Baseline: initiated at eval Goal status:MET  2.  The patient will improve LEFS by 8% to demonstrate improved functional abilities. Baseline:  47.5% Goal status: MET= at 62.5% on 8/4  3.   The patient will  demonstrate normal gait pattern with no evidence of antalgic pattern x 500 ft to d/c cane for community distances. Baseline:  Gets antalgic pattern after 100 ft in clinic Goal status: PARTIALLY MET  4.  The patient will maintain single leg standing R side x 8 seconds to demo good hip stability for functional loading. Baseline:  2 seconds,  L is 10 seconds. Goal status: MET on 03/27/24 10 seconds each side  LONG TERM GOALS: Target date: 05/17/24  The patient will be indep with progression of HEP. Baseline:  initiated at eval Goal status: INITIAL  2.  The patient will improve LEFS by 15% to demonstrate improved functional abilities. Baseline: 47.5% Goal status: INITIAL  3.  The patient will report no pain in R quadratus lumborum region demonstrating reduced compensatory strategies with gait. Baseline:  Is using QL for hip hike and has pain at eval Goal status: INITIAL  4.  The patient will be able to negotiate 12 steps with one rail and reciprocal pattern mod indep. Baseline:  has done steps 1x since d/c home Goal status: INITIAL  5.  The patient will demo improved R LE strength demonstrating SLR x 10 reps. Baseline:   to assess MMT Goal status: INITIAL  PLAN:  PT FREQUENCY: 2x/week  PT DURATION: 8 weeks  PLANNED INTERVENTIONS: 97164- PT Re-evaluation, 97750- Physical Performance Testing, 97110-Therapeutic exercises, 97530- Therapeutic activity, [id number]- Neuromuscular re-education, 97535- Self Care, 02859- Manual therapy, [phone]- Gait training, [phone]- Aquatic Therapy, [phone]- Electrical stimulation (unattended), [phone] (1-2 muscles), 20561 (3+ muscles)- Dry Needling, Patient/Family education, Balance training, Stair training, Taping, Joint mobilization, Cryotherapy, and Moist heat  PLAN FOR NEXT SESSION: Progress HEP for R hip strengthening, normalize gait pattern without device, work on QL lengthening due to pain, work on stairs and R LE stance control.  *anterior tibialis strengthening (due to dec'd eccentric control), treadmill working on incline for negotiating driveway  Prosperity Darrough, PT 04/14/24 12:03 PM

## 2024-04-14 NOTE — Telephone Encounter (Signed)
 Called and spoke w/ pt about HST results per Dr. Lionell note. Pt verbalized understanding. Aware to bring Xanax  to in lab titration study.  Relayed they will work on Clinical biochemist first and then will call to schedule her . Also explained the difference between CSA and OSA.

## 2024-04-16 ENCOUNTER — Encounter: Payer: Self-pay | Admitting: Rehabilitative and Restorative Service Providers"

## 2024-04-16 ENCOUNTER — Telehealth: Payer: Self-pay | Admitting: Neurology

## 2024-04-16 NOTE — Telephone Encounter (Signed)
 LVM for pt to call back to schedule left my direct number.   Medicare/BCBS supp no auth req

## 2024-04-17 NOTE — Telephone Encounter (Signed)
 Spoke with the patient.    CPAP Medicare/bcbs supp no auth req Patient is scheduled at [facility] For 04/27/2024 at 9 pm.  Mailed packet and sent mychart.

## 2024-04-18 NOTE — Therapy (Incomplete)
 OUTPATIENT PHYSICAL THERAPY LOWER EXTREMITY TREATMENT Progress Note Reporting Period 03/17/24 to 04/18/24  See note below for Objective Data and Assessment of Progress/Goals.      Patient Name: Molly Giles MRN: [id number] DOB:21-Sep-1955, 68 y.o., female Today's Date: 04/18/2024  END OF SESSION:    Past Medical History:  Diagnosis Date   Abdominal bloating 05/10/2021   Abdominal discomfort, generalized 05/07/2016   Acquired hallux rigidus of right foot 09/15/2022   Acute gastritis 11/19/2020   Allergic rhinoconjunctivitis 07/02/2015   Anal fissure 11/19/2020   Anterior to posterior tear of superior glenoid labrum of left shoulder 02/22/2021   Arthritis    Arthritis of carpometacarpal [facility]) joint of left thumb 09/07/2023   Aseptic loosening of prosthetic hip, initial encounter (HCC) 11/29/2017   Benign essential hypertension 10/28/2012   Benign positional vertigo 04/2015   Responded well to [facility]hab   Bruxism (teeth grinding)    Cervical spondylosis 11/18/2018   Chronic contact dermatitis 08/06/2018   Per allergist Dr Almarie Scala.    Chronic interstitial cystitis 11/09/2011   Managed by [facility] in [place] hydrodistention 5 years ago with Dr Claudene in [place], has relief of symptoms until now     Chronic low back pain 05/14/2019   Chronic migraine 02/25/2016   Per Dr Ines review in notes from [facility] from September 2015. Showed total headache days last month 18. Severe headache days 7 days. Moderate headache days 5 days. Mild headache days last month sick days. Days without headache last month 10 days. Symptoms associated with photophobia, phonophobia, osmophobia, neck pain, dizziness, jaw pain, nasal congestion, vision disturbances, tingling and numbness, weakness and worsening with activity. Each headache attack last 3 hours depending on treatment in severity. Left side, the right side, easier side, the frontal area in the back of the  head. Characterized as throbbing, pressure, tightness, squeezing, stabbing and burning    Chronic migraine without aura 05/15/2013    Dr Ines [facility]   Chronic tonsillitis 01/27/2019   DDD (degenerative disc disease), cervical 11/18/2018   DDD (degenerative disc disease), lumbar    Depression    Disc displacement, lumbar    Dysphagia 10/11/2021   Dysrhythmia    seen by dr Blanca- not a problem since she has been on Bystolic    Epiretinal membrane (ERM) of left eye 10/17/2021   Periodic ophthalmological monitoring     Episodic cluster headache, not intractable 03/06/2017   Essential hypertension 05/15/2013   Family history of adverse reaction to anesthesia    Brother- N/V   Family history of premature CAD 05/15/2013   Fibromyalgia 10/01/2013   Management by Dr GORMAN. Devashwar (Rhe[facility])     Fibromyalgia syndrome 07/01/2015   Management by Dr GORMAN. Devashwar ([facility])    GERD (gastroesophageal reflux disease) 12/16/2014   H/O seasonal allergies    Hallux rigidus of right foot 09/25/2022   Hammer toe    Left great toe   Hashimoto's thyroiditis    Per patient, diagnosed by Dr. Sharyne Pacini   Hearing loss of both ears 07/27/2015   mild to borderline moderate low frequency hearing loss improving to within normal limits bilaterally on audiology testing at [facility] in November 2016.     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   Hyperlipidemia 1998   Hypokalemia due to excessive gastrointestinal loss of potassium 07/28/2019   Hypothyroidism    Iliopsoas bursitis of left hip  05/08/2022   Impairment of balance 02/2015   Consequent of postconcussive syndrome   Injury of triangular fibrocartilage complex (TFCC) 02/25/2019   Injury of triangular fibrocartilage complex of left wrist 02/25/2019   Dx 02/25/19 Prentice Pagan IV MD ([facility])   Insulin  resistance 07/04/2017   Internal hemorrhoid 01/28/2021   Internal hemorrhoid seen on colonoscopy 10/2020 REGINOLD Sol MD [facility])   Interstitial cystitis    Irritable bowel syndrome with diarrhea 04/03/2016   Keratoconjunctivitis sicca 11/18/2018   (+) ANA     Left ventricular hypertrophy, mild 02/25/2016   ECHOcardiogram report 06/17/15 showing EF55-60%, mild LVH and G1DD    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HCC) 03/10/2018   [facility]   Low serum vitamin B12 01/10/2023   Lumbar facet joint pain    Meibomian gland dysfunction (MGD) of both eyes 10/17/2021   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  Added automatically from request for surgery [id number]     Formatting of this note might be different from the original.  Added automatically from request for surgery [id number]     Obesity (BMI 30.0-34.9) 01/29/2017   Odynophagia 12/20/2021   OSA (obstructive sleep apnea) 12/27/2022   Osteoarthritis of acromioclavicular joint 02/22/2021   Osteoarthritis of left hip 11/01/2015   MRI order by Dr Ernie (ortho) 10/2015 showed significant arthritis of left hip joint with cystic changes in femoral head c/w osteoarthritis   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, lumbar region 10/30/2011   Other insomnia 11/09/2016    Overweight    Pain in joint of left shoulder 11/09/2016   Pain in joint, multiple sites 11/18/2018   Palpitations 05/15/2013   [facility] manages   Perianal candidiasis 12/20/2021   Periodic limb movement sleep disorder 03/28/2017   Perirectal cyst 05/07/2016   Plantar fasciitis of right foot 11/10/2022   Poison ivy dermatitis 03/24/2021   PONV (postoperative nausea and vomiting)    Positive ANA (antinuclear antibody) 11/18/2018   Post concussion syndrome 06/06/2015   Post concussive syndrome 07/14/2015   Ms Huston's post-concussive syndrome manifesting in vertigo and headache, mood changes, poor balance, dizziness, and decreased concentration per Dr Ines at [facility].    Posterior vitreous detachment of right eye 2014   Premature menopause 12/20/2021   Primary osteoarthritis of left knee 06/20/2018   Psychophysiological insomnia 11/07/2022   Pure hypercholesterolemia 05/15/2013   Rectal abscess 05/10/2021   S/P left THA, AA 07/25/2016   S/P revision of total hip 04/10/2018   S/P tendon repair 01/23/2024   Sacroiliac inflammation (HCC) 08/18/2021   Severe obstructive sleep apnea-hypopnea syndrome 12/22/2022   Shingles    Shortness of breath dyspnea    with exertion   Sicca syndrome (HCC) 11/18/2018   (+) ANA   Sjogren syndrome (HCC) 01/27/2019   Sjogren's syndrome (  HCC) 01/27/2019   Sleep walking and eating 03/06/2017   Snoring 03/06/2017   Spondylosis of lumbar region without myelopathy or radiculopathy 10/30/2011   Status post total hip replacement, left 09/20/2017   TFC (triangular fibrocartilage complex) injury 02/25/2019   Thyroid  nodule 08/11/2009   Findings: The thyroid  gland is within normal limits in size.  The gland is diffusely inhomogeneous. A small solid nodule is noted in the lower pole  medially on the right of 7 x 6 x 8 mm. A small solid nodule is noted inferiorly on the left of 3 x 3 x 4 mm.  IMPRESSION:  The thyroid  gland is within normal  limits in size with only small solid nodules present, the largest of only 8 mm in diameter on the right.     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    Trochanteric bursitis, right hip 04/26/2020   Tubular adenoma of colon 01/28/2021   Colonoscopy screening 4 mm tubular Adenoma polyp Marilu Sol, MD [facility])   Tubular adenoma of colon 01/28/2021   Colonoscopy screening 4 mm tubular Adenoma polyp Marilu Sol, MD [facility])   Vasomotor symptoms due to menopause 04/19/2017   Vertigo 03/19/2023   Vitamin D  deficiency 05/08/2017   Vulvitis 07/22/2020   Yeast vaginitis 07/11/2019   Past Surgical History:  Procedure Laterality Date   arthroscopy Left 01/2022   with SAD and DCR, K. Supple MD   Bladder dilitation     x 3   BREAST BIOPSY Right 2011   Benign histology   CARDIOVASCULAR STRESS TEST  2000   Unremarkable per pt report   CARPOMETACARPAL JOINT ARTHROTOMY Right 2011   COLONOSCOPY     COLONOSCOPY WITH PROPOFOL  N/A 04/21/2015   Procedure: COLONOSCOPY WITH PROPOFOL ;  Surgeon: Belvie Just, MD;  Location: [facility] END[facility]COPY;  Service: Endoscopy;  Laterality: N/A;   CYSTOSCOPY W/ DILATION OF BLADDER N/A    EPIDURAL BLOCK INJECTION Left 04/12/2016   Left Medial Nerve Block and Left L5 ramus block, Dr Charlie Dolores    EPIDURAL BLOCK INJECTION  03/21/2016   Left L3-4 medial branch block and Left L5 & dorsal ramus block    EPIDURAL BLOCK INJECTION N/A 10/25/2016   Charlie Dolores, MD. Lumbar medial branch block   EPIDURAL BLOCK INJECTION N/A 02/09/2017   Charlie Dolores, MD.  Bilateral L3/4 medial branch block, bilateral L5 dorsal ramus block   EPIDURAL BLOCK INJECTION N/A 07/04/2017   Charlie Dolores, MD   EXCISION MORTON'S NEUROMA Right 05/03/2023   Procedure: EXCISION MORTON'S NEUROMA;  Surgeon: Kit Rush, MD;  Location: [facility];  Service: Orthopedics;  Laterality: Right;  general, local by surgeon 60  MIN   FECAL TRANSPLANT  04/21/2015   Procedure: FECAL TRANSPLANT;  Surgeon: Belvie Just, MD;  Location: [facility] ENDOSCOPY;  Service: Endoscopy;;   HIP ADDUCTOR TENOTOMY  01/2024   HIP ARTHROPLASTY Left    HIP ARTHROSCOPY Left 03/06/2018   Left hip arthroplasty, redo for loose hip arthroplasty. Procedure at [facility]   INJECTION HIP INTRA ARTICULAR Left 11/2015   for OA by Dr Charlie Dolores REDBIRD IMPLANT PLACEMENT  04/2021   Rush Sharps MD ([facility])   INTERSTIM IMPLANT REVISION N/A 09/2021   Rush Sharps MD ([facility])   LIGAMENT REPAIR Right 05/03/2023   Procedure: COLLATERAL LIGAMENT REPAIR;  Surgeon: Kit Rush, MD;  Location: [facility];  Service: Orthopedics;  Laterality: Right;  general, local by surgeon 60 MIN   OTHER SURGICAL HISTORY Left 2016   Left L3/L4 medial nerve block and Left L5 Dorsal Ramus block Dr FABIENE Dolores   TOTAL HIP ARTHROPLASTY Left 07/25/2016   Procedure: LEFT TOTAL HIP ARTHROPLASTY ANTERIOR APPROACH;  Surgeon: Donnice Car, MD;  Location: [facility];  Service: Orthopedics;  Laterality: Left;   WEIL OSTEOTOMY Right 05/03/2023   Procedure: RIGHT 2ND WEIL OSTEOTOMY;  Surgeon: Kit Rush, MD;  Location: [facility];  Service: Orthopedics;  Laterality: Right;  general, local by surgeon 60 MIN   Patient Active Problem List   Diagnosis Date Noted   Chronic pain of left upper extremity 02/19/2024   Arthritis of carpometacarpal [facility]) joint of left thumb 09/07/2023   Low serum vitamin B12 01/10/2023   OSA (obstructive sleep apnea) 12/27/2022   Severe obstructive sleep apnea-hypopnea syndrome 12/22/2022   Hyperglycemia 12/11/2022   SOBOE (shortness of breath on exertion) 12/11/2022   Other fatigue 12/11/2022   Sleep related headaches 11/07/2022   Intractable episodic cluster headache 11/07/2022   Psychophysiological insomnia 11/07/2022   Hallux rigidus of right foot 09/25/2022   Chronic pain of right  knee 05/08/2022   Arthrosis of hand 12/20/2021   History of Clostridioides difficile colitis, required fecal transplantation 12/20/2021   Meibomian gland dysfunction (MGD) of both eyes 10/17/2021   Nuclear sclerosis of both eyes 10/17/2021   Epiretinal membrane (ERM) of left eye 10/17/2021   OAB (overactive bladder) 03/02/2021   Osteoarthritis of acromioclavicular joint 02/22/2021   Hyperhidrosis, scalp, 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   Chronic low back pain 05/14/2019   Sjogren syndrome (HCC) 01/27/2019   DDD (degenerative disc disease), cervical 12/27/2018   Cervical spondylosis 11/18/2018   DDD (degenerative disc disease), lumbar 11/18/2018   Keratoconjunctivitis sicca 11/18/2018   Chronic contact dermatitis 08/06/2018   Primary osteoarthritis of left knee 06/20/2018   Insulin  resistance 07/04/2017   Irritable bowel syndrome with diarrhea 04/03/2016   Left ventricular hypertrophy, mild 02/25/2016   Allergic rhinoconjunctivitis 07/02/2015   History of colonic polyps 07/01/2015   GERD (gastroesophageal reflux disease) 12/16/2014   Fibromyalgia 10/01/2013   Mood disorder (HCC) 10/01/2013   Hyperlipidemia 10/01/2013   Pure hypercholesterolemia 05/15/2013   Chronic migraine without aura 05/15/2013   Benign essential hypertension 10/28/2012   Hypothyroidism 11/09/2011   Chronic interstitial cystitis 11/09/2011    PCP: McDiarmid, Krystal, MD REFERRING PROVIDER: Daniel Rush, PA-C REFERRING DIAG: s/p tendon repair Z 98.890  THERAPY DIAG:  No diagnosis found.  Rationale for Evaluation and Treatment: Rehabilitation  ONSET DATE: 01/23/24   SUBJECTIVE:  SUBJECTIVE STATEMENT: The patient reports she was sore Saturday after last PT session. Soreness lasted 1 day.  MD NOTE:  In physical therapy will work on abductor strengthening and gait training. Will work on balance. Will wean off of a walker on to a cane. Will stay on a cane until is walking without a limp.   PERTINENT HISTORY: HTN,  hypercholesterolemia, fibromyalgia, migraines, arthritis, concussion PAIN:  Are you having pain? Yes: NPRS scale: 3 Pain location: Rt QL,low back Pain description: achy and sore Aggravating factors: walking Relieving factors: none--mild discomfort  PRECAUTIONS: Other: The patient will be TDWB x 6 weeks, Abductor hip precautions x 6 weeks to the right side.  WEIGHT BEARING RESTRICTIONS: Yes initially TDWB x 6 weeks (she is beyond that per surgical date)  FALLS:  Has patient fallen in last 6 months? No  LIVING ENVIRONMENT: Lives with: lives with their spouse Lives in: House/apartment  Stairs: Yes: Internal: 12-14 steps; on right going up Has following equipment at home: Single point cane  PATIENT GOALS: No pain, no cane  NEXT MD VISIT: early September  OBJECTIVE:  Note: Objective measures were completed at Evaluation unless otherwise noted.  PATIENT SURVEYS:  LEFS : 47.5%  COGNITION: Overall cognitive status: Within functional limits for tasks assessed     SENSATION: WFL  POSTURE: No Significant postural limitations  PALPATION: Mild soreness in lateral hip  LOWER EXTREMITY ROM: AROM to 90 degrees hip flexion Passive ROM Right eval Left eval Right 03/27/24 Right 04/07/24  Hip flexion 95  In quadriped 120 deg Can sit into child's pose  Hip extension      Hip abduction 35     Hip adduction      Hip internal rotation 30     Hip external rotation 30     Knee flexion      Knee extension      Ankle dorsiflexion      Ankle plantarflexion      Ankle inversion      Ankle eversion       (Blank rows = not tested)  LOWER EXTREMITY MMT: did not test due to post surgical status-- will assess as we initiate strengthening progression in PT MMT Right eval Left eval  Hip flexion    Hip extension    Hip abduction    Hip adduction    Hip internal rotation    Hip external rotation    Knee flexion    Knee extension    Ankle dorsiflexion    Ankle plantarflexion     Ankle inversion    Ankle eversion     (Blank rows = not tested)  BALANCE: R single limb x 2 seconds  L single limb x 10 seconds  GAIT: Distance walked: 150 ft Assistive device utilized: Single point cane and None Level of assistance: Modified independence Comments: 3.2 ft/sec-- patient does start off without a limp and after 100 ft has a R antalgic gait pattern *PT and patient discussed still needing the cane when outdoors and in the evening when fatigued [facility] Adult PT Treatment:                                                DATE: 04/18/24 Therapeutic Exercise: *** Manual Therapy: *** Neuromuscular re-ed: *** Therapeutic Activity: Re-assessment to determine overall progress, educating patient on progress towards goals  Modalities: *** Self Care: ***  [facility] Adult PT Treatment:                                                DATE: 04/14/24 Therapeutic Exercise: Standing IR/ER on stool Quadriped Hip extension x 10 reps Sidelying Open book position x 10 reps Supine SLR x 5 reps x 2 sets Bent knee fallout x 5 reps R Hip adductor stretch hooklying Therapeutic Activity: Standing Step ups anterior x 10 reps Steps ups lateral x 5 reps 4 steps R side (gets QL pain), L sides x 5 reps Leg press 45# x 12 reps 65# x 6 reps Gait: Gait activities x 600 ft with antalgic pattern beginning at 220 ft. Treadmill 2% incline up to 1.5 mph  [facility] Adult PT  Treatment:                                                DATE: 04/10/24 Therapeutic Exercise: Standing Isolated hip IR/ER Quadriped Rocking anterior/posterior Hip extension with cues for core engagement Therapeutic Activity: Squat + sidestepping x 10 reps x 2 sets Step ups anteriorly with contralateral LE marching -- fatigues at 10 reps R and L sides with intermittent UE support Lateral step ups lead to pain in R QL so did not perform  Sit<>stand x 10 reps without UE support Leg press x 45 # x 20 reps Manual: Trunk rotation and  shoulder rotation in opposite directions for transverse rotation to reduce QL pain Gait: Treadmill x 4 minutes at 2% incline x 1.5 mph working on space between feet with gait activities Functional gait with farmer's carry 5# kettle bells in each hand x 160 ft-- note antalgic gait begin  Resisted gait posterior walking 5# at pulley with belt x 8 reps  [facility] Adult PT Treatment:                                                DATE: 04/07/24 Therapeutic Exercise: Quadriped Rocking anterior/posterior Wag the dog R and L for QL stretching Child's pose Standing Hip IR/ER on the stool x 2 reps x 10  Sidelying Hip abduction x 10 reps Small arcs over yoga block x 10 reps Supine Bent knee fallouts x 10 reps SLR partial range x 10 reps, painful after performing Manual Therapy: QL STM in sidelying Trunk rotation contra to shoulder rotation for transverse rotation with cues Neuromuscular re-ed: Marching slow pace with core engagement Single limb standing Therapeutic Activity: Treadmill x 3.5 minutes at 1.2-1.5 mph at 1% incline-- due to driveway at slight incline Sit<>stand x 10 reps Gait: Wide leg walking encouraging wider base of support with intention Backwards walking   [facility] Adult PT Treatment:                                                DATE: 04/02/24 Therapeutic Exercise: Open book x 10  Childs pose 2 x 30 sec  Hip IR/ER on stool 2  x10  Standing march x 10 Manual Therapy: STM Rt QL, lumbar paraspinals Neuromuscular re-ed: Clamshells yellow band 2 x 10  SLR 2 x 10; partial range Therapeutic Activity: Treadmill walking incline level 2, speed 1.1 x 5 minutes  Leg press 2 x 10 @ 45 lbs    PATIENT EDUCATION:  Education details: HEP review Person educated: Patient Education method: Explanation Education comprehension: verbalized understanding  HOME EXERCISE PROGRAM: Access Code: [facility] URL: [email or web address] Date: 04/10/2024 Prepared by: Tawni Ferrier  Exercises - Quadruped Rocking Backward  - 1 x daily - 5 x weekly - 1 sets - 10 reps - Standing Hip Extension with Counter Support  - 1 x daily - 5 x weekly - 1 sets - 10 reps - Standing Hip Abduction with Counter Support  - 1 x daily - 5 x weekly - 1 sets - 10 reps - Side Stepping with Resistance at Ankles  -  1 x daily - 4 x weekly - 2 sets - 10 reps - Standing Shoulder and Trunk Flexion at Table  - 1 x daily - 5 x weekly - 1 sets - 2 reps - 20 seconds hold - Supine March with Resistance Band  - 1 x daily - 5 x weekly - 2 sets - 10 reps - Clamshell  - 1 x daily - 5 x weekly - 2 sets - 10 reps - Prone Hip Extension  - 1 x daily - 5 x weekly - 2 sets - 10 reps - Towel Scrunches  - 1 x daily - 4-5 x weekly - 1 sets - 10 reps - Seated Plantar Fascia Mobilization with Small Ball  - 1 x daily - 4-5 x weekly - 1 sets - 10 reps - Seated Arch Lifts  - 1 x daily - 4-5 x weekly - 1 sets - 10 reps  ASSESSMENT: CLINICAL IMPRESSION: Loralai is making steady progress in PT s/p Rt glute med repair on 01/23/24.   EVAL: Patient is a 68 y.o. female who was seen today for physical therapy evaluation and treatment s/p R glut med repair. She is 6.5 weeks post op today and cleared for WBAT and to begin strengthening. PT initiated HEP today for gentle ROM, glut activations, and to also address R QL pain. Patient feels that cane use is flaring other pain including L 1st MCP joint, L shoulder, and R QL. PT discussed the rationale for cane use discussing goal to normalize gait and avoid an antalgic pattern, therefore, patient to use when in the community and in the evening if she is fatigued. PT to address deficits and promote return to prior functional status.   OBJECTIVE IMPAIRMENTS: Abnormal gait, decreased activity tolerance, decreased balance, decreased mobility, difficulty walking, decreased ROM, decreased strength, increased fascial restrictions, impaired flexibility, and pain.   GOALS: Goals reviewed with  patient? Yes  SHORT TERM GOALS: Target date: 04/16/24  The patient will be indep with initial HEP Baseline: initiated at eval Goal status:MET  2.  The patient will improve LEFS by 8% to demonstrate improved functional abilities. Baseline:  47.5% Goal status: MET= at 62.5% on 8/4  3.   The patient will  demonstrate normal gait pattern with no evidence of antalgic pattern x 500 ft to d/c cane for community distances. Baseline:  Gets antalgic pattern after 100 ft in clinic Goal status: PARTIALLY MET  4.  The patient will maintain single leg standing R side x 8 seconds to demo good hip stability for functional loading. Baseline:  2 seconds, L is 10 seconds. Goal status: MET on 03/27/24 10 seconds each side  LONG TERM GOALS: Target date: 05/17/24  The patient will be indep with progression of HEP. Baseline:  initiated at eval Goal status: INITIAL  2.  The patient will improve LEFS by 15% to demonstrate improved functional abilities. Baseline: 47.5% Goal status: INITIAL  3.  The patient will report no pain in R quadratus lumborum region demonstrating reduced compensatory strategies with gait. Baseline:  Is using QL for hip hike and has pain at eval Goal status: INITIAL  4.  The patient will be able to negotiate 12 steps with one rail and reciprocal pattern mod indep. Baseline:  has done steps 1x since d/c home Goal status: INITIAL  5.  The patient will demo improved R LE strength demonstrating SLR x 10 reps. Baseline:   to assess MMT Goal status: INITIAL  PLAN:  PT FREQUENCY: 2x/week  PT DURATION: 8 weeks  PLANNED INTERVENTIONS: [id number]- PT Re-evaluation, 97750- Physical Performance Testing, 97110-Therapeutic exercises, 97530- Therapeutic activity, [id number]- Neuromuscular re-education, 97535- Self Care, [id number]- Manual therapy, [phone]- Gait training, [phone]- Aquatic Therapy, [phone]- Electrical stimulation (unattended), [phone] (1-2 muscles), [phone] (3+ muscles)- Dry Needling, Patient/Family  education, Balance training, Stair training, Taping, Joint mobilization, Cryotherapy, and Moist heat  PLAN FOR NEXT SESSION: Progress HEP for R hip strengthening, normalize gait pattern without device, work on QL lengthening due to pain, work on stairs and R LE stance control.  *anterior tibialis strengthening (due to dec'd eccentric control), treadmill working on incline for negotiating driveway  Lucie Meeter, PT, DPT, ATC 04/18/24 7:50 AM

## 2024-04-21 ENCOUNTER — Encounter: Payer: Self-pay | Admitting: Rehabilitative and Restorative Service Providers"

## 2024-04-21 ENCOUNTER — Ambulatory Visit: Payer: Self-pay | Admitting: Rehabilitative and Restorative Service Providers"

## 2024-04-21 ENCOUNTER — Encounter (INDEPENDENT_AMBULATORY_CARE_PROVIDER_SITE_OTHER): Payer: Self-pay

## 2024-04-21 ENCOUNTER — Encounter

## 2024-04-21 DIAGNOSIS — M6281 Muscle weakness (generalized): Secondary | ICD-10-CM | POA: Diagnosis not present

## 2024-04-21 DIAGNOSIS — M25551 Pain in right hip: Secondary | ICD-10-CM

## 2024-04-21 DIAGNOSIS — Z719 Counseling, unspecified: Secondary | ICD-10-CM

## 2024-04-21 DIAGNOSIS — R2689 Other abnormalities of gait and mobility: Secondary | ICD-10-CM

## 2024-04-21 NOTE — Therapy (Addendum)
 OUTPATIENT PHYSICAL THERAPY LOWER EXTREMITY TREATMENT Progress Note Reporting Period 03/17/24 to 04/21/24  See note below for Objective Data and Assessment of Progress/Goals.    Patient Name: Molly Giles MRN: [id number] DOB:11-26-1955, 68 y.o., female Today's Date: 04/21/2024  END OF SESSION:  PT End of Session - 04/21/24 1406     Visit Number 10    Number of Visits 16    Date for PT Re-Evaluation 04/16/24    Authorization Type medicare and BCBS supplemental    Progress Note Due on Visit 10    PT Start Time 1402    PT Stop Time 1445    PT Time Calculation (min) 43 min    Activity Tolerance Patient tolerated treatment well    Behavior During Therapy [facility] for tasks assessed/performed           Past Medical History:  Diagnosis Date   Abdominal bloating 05/10/2021   Abdominal discomfort, generalized 05/07/2016   Acquired hallux rigidus of right foot 09/15/2022   Acute gastritis 11/19/2020   Allergic rhinoconjunctivitis 07/02/2015   Anal fissure 11/19/2020   Anterior to posterior tear of superior glenoid labrum of left shoulder 02/22/2021   Arthritis    Arthritis of carpometacarpal (CMC) joint of left thumb 09/07/2023   Aseptic loosening of prosthetic hip, initial encounter (HCC) 11/29/2017   Benign essential hypertension 10/28/2012   Benign positional vertigo 04/2015   Responded well to [facility]   Bruxism (teeth grinding)    Cervical spondylosis 11/18/2018   Chronic contact dermatitis 08/06/2018   Per allergist Dr Almarie Scala.    Chronic interstitial cystitis 11/09/2011   Managed by [facility] urology branch in [place] hydrodistention 5 years ago with Dr Claudene in [place], has relief of symptoms until now     Chronic low back pain 05/14/2019   Chronic migraine 02/25/2016   Per Dr Ines review in notes from [facility] from September 2015. Showed total headache days last month 18. Severe headache days 7 days. Moderate headache days 5 days. Mild  headache days last month sick days. Days without headache last month 10 days. Symptoms associated with photophobia, phonophobia, osmophobia, neck pain, dizziness, jaw pain, nasal congestion, vision disturbances, tingling and numbness, weakness and worsening with activity. Each headache attack last 3 hours depending on treatment in severity. Left side, the right side, easier side, the frontal area in the back of the head. Characterized as throbbing, pressure, tightness, squeezing, stabbing and burning    Chronic migraine without aura 05/15/2013    Dr Ines [facility]   Chronic tonsillitis 01/27/2019   DDD (degenerative disc disease), cervical 11/18/2018   DDD (degenerative disc disease), lumbar    Depression    Disc displacement, lumbar    Dysphagia 10/11/2021   Dysrhythmia    seen by dr Blanca- not a problem since she has been on Bystolic    Epiretinal membrane (ERM) of left eye 10/17/2021   Periodic ophthalmological monitoring     Episodic cluster headache, not intractable 03/06/2017   Essential hypertension 05/15/2013   Family history of adverse reaction to anesthesia    Brother- N/V   Family history of premature CAD 05/15/2013   Fibromyalgia 10/01/2013   Management by Dr GORMAN. Devashwar (Rheum)     Fibromyalgia syndrome 07/01/2015   Management by Dr GORMAN. Devashwar ([facility]he[facility])    GERD (gastroesophageal reflux disease) 12/16/2014   H/O seasonal allergies    Hallux rigidus of right foot 09/25/2022   Hammer toe    Left great toe  Hashimoto's thyroiditis    Per patient, diagnosed by Dr. Sharyne Pacini   Hearing loss of both ears 07/27/2015   mild to borderline moderate low frequency hearing loss improving to within normal limits bilaterally on audiology testing at [facility] in November 2016.     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   Hyperlipidemia 1998   Hypokalemia due to excessive gastrointestinal loss of potassium 07/28/2019   Hypothyroidism    Iliopsoas bursitis of left hip 05/08/2022   Impairment of balance 02/2015   Consequent of postconcussive syndrome   Injury of triangular fibrocartilage complex (TFCC) 02/25/2019   Injury of triangular fibrocartilage complex of left wrist 02/25/2019   Dx 02/25/19 Prentice Pagan IV MD ([facility])   Insulin  resistance 07/04/2017   Internal hemorrhoid 01/28/2021   Internal hemorrhoid seen on colonoscopy 10/2020 REGINOLD Sol MD [facility])   Interstitial cystitis    Irritable bowel syndrome with diarrhea 04/03/2016   Keratoconjunctivitis sicca 11/18/2018   (+) ANA     Left ventricular hypertrophy, mild 02/25/2016   ECHOcardiogram report 06/17/15 showing EF55-60%, mild LVH and G1DD    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HCC) 03/10/2018   [facility]   Low serum vitamin B12 01/10/2023   Lumbar facet joint pain    Meibomian gland dysfunction (MGD) of both eyes 10/17/2021   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  Added automatically from request for surgery [id number]     Formatting of this note might be different from the original.  Added automatically from request for surgery [id number]     Obesity (BMI 30.0-34.9) 01/29/2017   Odynophagia  12/20/2021   OSA (obstructive sleep apnea) 12/27/2022   Osteoarthritis of acromioclavicular joint 02/22/2021   Osteoarthritis of left hip 11/01/2015   MRI order by Dr Ernie (ortho) 10/2015 showed significant arthritis of left hip joint with cystic changes in femoral head c/w osteoarthritis   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, lumbar region 10/30/2011   Other insomnia 11/09/2016   Overweight    Pain in joint of left shoulder 11/09/2016   Pain in joint, multiple sites 11/18/2018   Palpitations 05/15/2013   [facility] manages   Perianal candidiasis 12/20/2021   Periodic limb movement sleep disorder 03/28/2017   Perirectal cyst 05/07/2016   Plantar fasciitis of right foot 11/10/2022   Poison ivy dermatitis 03/24/2021   PONV (postoperative nausea and vomiting)    Positive ANA (antinuclear antibody) 11/18/2018   Post concussion syndrome 06/06/2015   Post concussive syndrome 07/14/2015   Ms Hartwig's post-concussive syndrome manifesting in vertigo and headache, mood changes, poor balance, dizziness, and decreased concentration per Dr Ines at [facility].    Posterior vitreous detachment of right eye 2014   Premature menopause 12/20/2021  Primary osteoarthritis of left knee 06/20/2018   Psychophysiological insomnia 11/07/2022   Pure hypercholesterolemia 05/15/2013   Rectal abscess 05/10/2021   S/P left THA, AA 07/25/2016   S/P revision of total hip 04/10/2018   S/P tendon repair 01/23/2024   Sacroiliac inflammation (HCC) 08/18/2021   Severe obstructive sleep apnea-hypopnea syndrome 12/22/2022   Shingles    Shortness of breath dyspnea    with exertion   Sicca syndrome (HCC) 11/18/2018   (+) ANA   Sjogren syndrome (HCC) 01/27/2019   Sjogren's syndrome (HCC) 01/27/2019   Sleep walking and eating 03/06/2017   Snoring 03/06/2017   Spondylosis of lumbar region without myelopathy or radiculopathy 10/30/2011   Status post total hip replacement, left  09/20/2017   TFC (triangular fibrocartilage complex) injury 02/25/2019   Thyroid  nodule 08/11/2009   Findings: The thyroid  gland is within normal limits in size.  The gland is diffusely inhomogeneous. A small solid nodule is noted in the lower pole  medially on the right of 7 x 6 x 8 mm. A small solid nodule is noted inferiorly on the left of 3 x 3 x 4 mm.  IMPRESSION:  The thyroid  gland is within normal limits in size with only small solid nodules present, the largest of only 8 mm in diameter on the right.     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    Trochanteric bursitis, right hip 04/26/2020   Tubular adenoma of colon 01/28/2021   Colonoscopy screening 4 mm tubular Adenoma polyp Marilu Sol, MD [facility])   Tubular adenoma of colon 01/28/2021   Colonoscopy screening 4 mm tubular Adenoma polyp Marilu Sol, MD [facility])   Vasomotor symptoms due to menopause 04/19/2017   Vertigo 03/19/2023   Vitamin D  deficiency 05/08/2017   Vulvitis 07/22/2020   Yeast vaginitis 07/11/2019   Past Surgical History:  Procedure Laterality Date   arthroscopy Left 01/2022   with SAD and DCR, K. Supple MD   Bladder dilitation     x 3   BREAST BIOPSY Right 2011   Benign histology   CARDIOVASCULAR STRESS TEST  2000   Unremarkable per pt report   CARPOMETACARPAL JOINT ARTHROTOMY Right 2011   COLONOSCOPY     COLONOSCOPY WITH PROPOFOL  N/A 04/21/2015   Procedure: COLONOSCOPY WITH PROPOFOL ;  Surgeon: Belvie Just, MD;  Location: [facility] ENDOSCOPY;  Service: Endoscopy;  Laterality: N/A;   CYSTOSCOPY W/ DILATION OF BLADDER N/A    EPIDURAL BLOCK INJECTION Left 04/12/2016   Left Medial Nerve Block and Left L5 ramus block, Dr Charlie Dolores    EPIDURAL BLOCK INJECTION  03/21/2016   Left L3-4 medial branch block and Left L5 & dorsal ramus block    EPIDURAL BLOCK INJECTION N/A 10/25/2016   Charlie Dolores, MD. Lumbar medial branch block   EPIDURAL  BLOCK INJECTION N/A 02/09/2017   Charlie Dolores, MD.  Bilateral L3/4 medial branch block, bilateral L5 dorsal ramus block   EPIDURAL BLOCK INJECTION N/A 07/04/2017   Charlie Dolores, MD   EXCISION MORTON'S NEUROMA Right 05/03/2023   Procedure: EXCISION MORTON'S NEUROMA;  Surgeon: Kit Rush, MD;  Location: [facility];  Service: Orthopedics;  Laterality: Right;  general, local by surgeon 60 MIN   FECAL TRANSPLANT  04/21/2015   Procedure: FECAL TRANSPLANT;  Surgeon: Belvie Just, MD;  Location: [facility] ENDOSCOPY;  Service: Endoscopy;;   HIP ADDUCTOR TENOTOMY  01/2024   HIP ARTHROPLASTY Left    HIP ARTHROSCOPY  Left 03/06/2018   Left hip arthroplasty, redo for loose hip arthroplasty. Procedure at [facility] hospital   INJECTION HIP INTRA ARTICULAR Left 11/2015   for OA by Dr Charlie Bonner REDBIRD IMPLANT PLACEMENT  04/2021   Norleen Sharps MD ([facility])   INTERSTIM IMPLANT REVISION N/A 09/2021   Norleen Sharps MD ([facility])   LIGAMENT REPAIR Right 05/03/2023   Procedure: COLLATERAL LIGAMENT REPAIR;  Surgeon: Kit Norleen, MD;  Location: [facility];  Service: Orthopedics;  Laterality: Right;  general, local by surgeon 60 MIN   OTHER SURGICAL HISTORY Left 2016   Left L3/L4 medial nerve block and Left L5 Dorsal Ramus block Dr FABIENE Bonner   TOTAL HIP ARTHROPLASTY Left 07/25/2016   Procedure: LEFT TOTAL HIP ARTHROPLASTY ANTERIOR APPROACH;  Surgeon: Donnice Car, MD;  Location: [facility];  Service: Orthopedics;  Laterality: Left;   WEIL OSTEOTOMY Right 05/03/2023   Procedure: RIGHT 2ND WEIL OSTEOTOMY;  Surgeon: Kit Norleen, MD;  Location: [facility];  Service: Orthopedics;  Laterality: Right;  general, local by surgeon 60 MIN   Patient Active Problem List   Diagnosis Date Noted   Chronic pain of left upper extremity 02/19/2024   Arthritis of carpometacarpal [facility]) joint of left thumb 09/07/2023   Low serum vitamin B12 01/10/2023   OSA  (obstructive sleep apnea) 12/27/2022   Severe obstructive sleep apnea-hypopnea syndrome 12/22/2022   Hyperglycemia 12/11/2022   SOBOE (shortness of breath on exertion) 12/11/2022   Other fatigue 12/11/2022   Sleep related headaches 11/07/2022   Intractable episodic cluster headache 11/07/2022   Psychophysiological insomnia 11/07/2022   Hallux rigidus of right foot 09/25/2022   Chronic pain of right knee 05/08/2022   Arthrosis of hand 12/20/2021   History of Clostridioides difficile colitis, required fecal transplantation 12/20/2021   Meibomian gland dysfunction (MGD) of both eyes 10/17/2021   Nuclear sclerosis of both eyes 10/17/2021   Epiretinal membrane (ERM) of left eye 10/17/2021   OAB (overactive bladder) 03/02/2021   Osteoarthritis of acromioclavicular joint 02/22/2021   Hyperhidrosis, scalp, 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   Chronic low back pain 05/14/2019   Sjogren syndrome (HCC) 01/27/2019   DDD (degenerative disc disease), cervical 12/27/2018   Cervical spondylosis 11/18/2018   DDD (degenerative disc disease), lumbar 11/18/2018   Keratoconjunctivitis sicca 11/18/2018   Chronic contact dermatitis 08/06/2018   Primary osteoarthritis of left knee 06/20/2018   Insulin  resistance 07/04/2017   Irritable bowel syndrome with diarrhea 04/03/2016   Left ventricular hypertrophy, mild 02/25/2016   Allergic rhinoconjunctivitis 07/02/2015   History of colonic polyps 07/01/2015   GERD (gastroesophageal reflux disease) 12/16/2014   Fibromyalgia 10/01/2013   Mood disorder (HCC) 10/01/2013   Hyperlipidemia 10/01/2013   Pure hypercholesterolemia 05/15/2013   Chronic migraine without aura 05/15/2013   Benign essential hypertension 10/28/2012   Hypothyroidism 11/09/2011   Chronic interstitial cystitis 11/09/2011    PCP: McDiarmid, Krystal, MD REFERRING PROVIDER: Daniel Norleen, PA-C REFERRING DIAG: s/p tendon repair Z 98.890  THERAPY DIAG:  Muscle weakness (generalized)  Other abnormalities of  gait and mobility  Pain in right hip  Rationale for Evaluation and Treatment: Rehabilitation  ONSET DATE: 01/23/24   SUBJECTIVE:  SUBJECTIVE STATEMENT: The patient reports vertigo has been present and has limited her activity over the weekend. The patient is feeling soreness in her R QL  MD NOTE:  In physical therapy will work on abductor strengthening and gait training. Will work on balance. Will wean off of a walker on  to a cane. Will stay on a cane until is walking without a limp.   PERTINENT HISTORY: HTN, hypercholesterolemia, fibromyalgia, migraines, arthritis, concussion PAIN:  Are you having pain? Yes: NPRS scale: 4/10 Pain location: Rt QL,low back Pain description: achy and sore Aggravating factors: walking Relieving factors: none--mild discomfort  PRECAUTIONS: Other: The patient will be TDWB x 6 weeks, Abductor hip precautions x 6 weeks to the right side.  WEIGHT BEARING RESTRICTIONS: Yes initially TDWB x 6 weeks (she is beyond that per surgical date)  FALLS:  Has patient fallen in last 6 months? No  LIVING ENVIRONMENT: Lives with: lives with their spouse Lives in: House/apartment Stairs: Yes: Internal: 12-14 steps; on right going up Has following equipment at home: Single point cane  PATIENT GOALS: No pain, no cane  NEXT MD VISIT: early September  OBJECTIVE:  Note: Objective measures were completed at Evaluation unless otherwise noted.  PATIENT SURVEYS:  LEFS : 47.5%  COGNITION: Overall cognitive status: Within functional limits for tasks assessed     SENSATION: WFL  POSTURE: No Significant postural limitations  PALPATION: Mild soreness in lateral hip  LOWER EXTREMITY ROM: AROM to 90 degrees hip flexion Passive ROM Right eval Left eval Right 03/27/24 Right 04/07/24  Hip flexion 95  In quadriped 120 deg Can sit into child's pose  Hip extension      Hip abduction 35     Hip adduction      Hip internal rotation 30     Hip external rotation 30      Knee flexion      Knee extension      Ankle dorsiflexion      Ankle plantarflexion      Ankle inversion      Ankle eversion       (Blank rows = not tested)  LOWER EXTREMITY MMT: did not test due to post surgical status-- will assess as we initiate strengthening progression in PT MMT Right eval Left eval  Hip flexion    Hip extension    Hip abduction    Hip adduction    Hip internal rotation    Hip external rotation    Knee flexion    Knee extension    Ankle dorsiflexion    Ankle plantarflexion    Ankle inversion    Ankle eversion     (Blank rows = not tested)  BALANCE: R single limb x 2 seconds  L single limb x 10 seconds  GAIT: Distance walked: 150 ft Assistive device utilized: Single point cane and None Level of assistance: Modified independence Comments: 3.2 ft/sec-- patient does start off without a limp and after 100 ft has a R antalgic gait pattern *PT and patient discussed still needing the cane when outdoors and in the evening when fatigued   [facility] Adult PT Treatment:                                                DATE: 04/21/24 Therapeutic Exercise: Supine SLR x 10 reps  Sidelying Hip abduction x 10 reps PT Re-eval: POSITIONAL TESTING: R dix hallpike: upbeating, R rotary nystagmus L dix hallpike: upbeating, L rotary nystagmus Canolith repositioning maneuver: R epley's Retested with no nystagmus in room light Habituation  brandt daroff x 2 reps to each side to review for HEP Gait: x 50 feet looking at pattern, stairs x  12 steps reciprocal pattern with one handrail Gait x 200 ft with cues for glut activation   [facility] Adult PT Treatment:                                                DATE: 04/14/24 Therapeutic Exercise: Standing IR/ER on stool Quadriped Hip extension x 10 reps Sidelying Open book position x 10 reps Supine SLR x 5 reps x 2 sets Bent knee fallout x 5 reps R Hip adductor stretch hooklying Therapeutic Activity: Standing Step ups  anterior x 10 reps Steps ups lateral x 5 reps 4 steps R side (gets QL pain), L sides x 5 reps Leg press 45# x 12 reps 65# x 6 reps Gait: Gait activities x 600 ft with antalgic pattern beginning at 220 ft. Treadmill 2% incline up to 1.5 mph  [facility] Adult PT Treatment:                                                DATE: 04/10/24 Therapeutic Exercise: Standing Isolated hip IR/ER Quadriped Rocking anterior/posterior Hip extension with cues for core engagement Therapeutic Activity: Squat + sidestepping x 10 reps x 2 sets Step ups anteriorly with contralateral LE marching -- fatigues at 10 reps R and L sides with intermittent UE support Lateral step ups lead to pain in R QL so did not perform  Sit<>stand x 10 reps without UE support Leg press x 45 # x 20 reps Manual: Trunk rotation and shoulder rotation in opposite directions for transverse rotation to reduce QL pain Gait: Treadmill x 4 minutes at 2% incline x 1.5 mph working on space between feet with gait activities Functional gait with farmer's carry 5# kettle bells in each hand x 160 ft-- note antalgic gait begin  Resisted gait posterior walking 5# at pulley with belt x 8 reps  [facility] Adult PT Treatment:                                                DATE: 04/07/24 Therapeutic Exercise: Quadriped Rocking anterior/posterior Wag the dog R and L for QL stretching Child's pose Standing Hip IR/ER on the stool x 2 reps x 10  Sidelying Hip abduction x 10 reps Small arcs over yoga block x 10 reps Supine Bent knee fallouts x 10 reps SLR partial range x 10 reps, painful after performing Manual Therapy: QL STM in sidelying Trunk rotation contra to shoulder rotation for transverse rotation with cues Neuromuscular re-ed: Marching slow pace with core engagement Single limb standing Therapeutic Activity: Treadmill x 3.5 minutes at 1.2-1.5 mph at 1% incline-- due to driveway at slight incline Sit<>stand x 10 reps Gait: Wide leg walking  encouraging wider base of support with intention Backwards walking   [facility] Adult PT Treatment:                                                DATE: 04/02/24 Therapeutic Exercise: Open book x 10  Karolynn pose 2 x 30 sec  Hip IR/ER on stool 2  x10  Standing march x 10 Manual Therapy: STM Rt QL, lumbar paraspinals Neuromuscular re-ed: Clamshells yellow band 2 x 10  SLR 2 x 10; partial range Therapeutic Activity: Treadmill walking incline level 2, speed 1.1 x 5 minutes  Leg press 2 x 10 @ 45 lbs    PATIENT EDUCATION:  Education details: HEP review Person educated: Patient Education method: Explanation Education comprehension: verbalized understanding  HOME EXERCISE PROGRAM: Access Code: [facility] URL: [email or web address] Date: 04/10/2024 Prepared by: Tawni Ferrier  Exercises - Quadruped Rocking Backward  - 1 x daily - 5 x weekly - 1 sets - 10 reps - Standing Hip Extension with Counter Support  - 1 x daily - 5 x weekly - 1 sets - 10 reps - Standing Hip Abduction with Counter Support  - 1 x daily - 5 x weekly - 1 sets - 10 reps - Side Stepping with Resistance at Ankles  - 1 x daily - 4 x weekly - 2 sets - 10 reps - Standing Shoulder and Trunk Flexion at Table  - 1 x daily - 5 x weekly - 1 sets - 2 reps - 20 seconds hold - Supine March with Resistance Band  - 1 x daily - 5 x weekly - 2 sets - 10 reps - Clamshell  - 1 x daily - 5 x weekly - 2 sets - 10 reps - Prone Hip Extension  - 1 x daily - 5 x weekly - 2 sets - 10 reps - Towel Scrunches  - 1 x daily - 4-5 x weekly - 1 sets - 10 reps - Seated Plantar Fascia Mobilization with Small Ball  - 1 x daily - 4-5 x weekly - 1 sets - 10 reps - Seated Arch Lifts  - 1 x daily - 4-5 x weekly - 1 sets - 10 reps  ASSESSMENT: CLINICAL IMPRESSION: Adlyn is making steady progress in PT s/p Rt glute med repair on 01/23/24.  She arrived to PT today reporting positional vertigo has returned. PT assessed. We also did assessments of  stair negotiation and gait-- patient does continue with antalgic pattern. PT provided cues for glut activation at mid stance to reduce trendelenberg-- plan to continue to focus on endurance for extended gait. PT to continue working towards LTGs emphasizing lateral hip stability, endurance, and strengthening.   EVAL: Patient is a 68 y.o. female who was seen today for physical therapy evaluation and treatment s/p R glut med repair. She is 6.5 weeks post op today and cleared for WBAT and to begin strengthening. PT initiated HEP today for gentle ROM, glut activations, and to also address R QL pain. Patient feels that cane use is flaring other pain including L 1st MCP joint, L shoulder, and R QL. PT discussed the rationale for cane use discussing goal to normalize gait and avoid an antalgic pattern, therefore, patient to use when in the community and in the evening if she is fatigued. PT to address deficits and promote return to prior functional status.   OBJECTIVE IMPAIRMENTS: Abnormal gait, decreased activity tolerance, decreased balance, decreased mobility, difficulty walking, decreased ROM, decreased strength, increased fascial restrictions, impaired flexibility, and pain.   GOALS: Goals reviewed with patient? Yes  SHORT TERM GOALS: Target date: 04/16/24  The patient will be indep with initial HEP Baseline: initiated at eval Goal status:MET  2.  The patient will improve LEFS by 8% to demonstrate improved functional abilities. Baseline:  47.5% Goal status: MET= at 62.5% on 8/4  3.   The patient will  demonstrate normal gait pattern with no evidence of antalgic pattern x 500 ft to d/c cane for community distances. Baseline:  Gets antalgic pattern after 100 ft in clinic Goal status: PARTIALLY MET  4.  The patient will maintain single leg standing R side x 8 seconds to demo good hip stability for functional loading. Baseline:  2 seconds, L is 10 seconds. Goal status: MET on 03/27/24 10 seconds each  side  LONG TERM GOALS: Target date: 05/17/24  The patient will be indep with progression of HEP. Baseline:  initiated at eval Goal status: INITIAL  2.  The patient will improve LEFS by 15% to demonstrate improved functional abilities. Baseline: 47.5% Goal status: INITIAL  3.  The patient will report no pain in R quadratus lumborum region demonstrating reduced compensatory strategies with gait. Baseline:  Is using QL for hip hike and has pain at eval Goal status: INITIAL  4.  The patient will be able to negotiate 12 steps with one rail and reciprocal pattern mod indep. Baseline:  has done steps 1x since d/c home Goal status: MET  5.  The patient will demo improved R LE strength demonstrating SLR x 10 reps. Baseline:   to assess MMT Goal status: PARTIALLY MET-- hard to control with eccentric control   6. The patient will have negative positional testing indicating resolution of BPPV.  Baseline: + R BPPV  Goal status: NEW  PLAN:  PT FREQUENCY: 2x/week  PT DURATION: 8 weeks  PLANNED INTERVENTIONS: 97164- PT Re-evaluation, 97750- Physical Performance Testing, 97110-Therapeutic exercises, 97530- Therapeutic activity, [id number]- Neuromuscular re-education, 97535- Self Care, [id number]- Manual therapy, [phone]- Gait training, [phone]- Aquatic Therapy, [phone]- Electrical stimulation (unattended), [phone] (1-2 muscles), [id number] (3+ muscles)- Dry Needling, Patient/Family education, Balance training, Stair training, Taping, Joint mobilization, Cryotherapy, and Moist heat  PLAN FOR NEXT SESSION: Progress HEP for R hip strengthening, normalize gait pattern without device, work on QL lengthening due to pain, work on stairs and R LE stance control.  *anterior tibialis strengthening (due to dec'd eccentric control), treadmill working on incline for negotiating driveway Check BPPV as needed  Anne-Marie Genson, PT 04/21/24 3:35 PM

## 2024-04-22 ENCOUNTER — Encounter (INDEPENDENT_AMBULATORY_CARE_PROVIDER_SITE_OTHER): Payer: Self-pay | Admitting: Family Medicine

## 2024-04-22 ENCOUNTER — Telehealth (HOSPITAL_COMMUNITY): Payer: Self-pay

## 2024-04-22 ENCOUNTER — Other Ambulatory Visit (HOSPITAL_COMMUNITY): Payer: Self-pay

## 2024-04-22 ENCOUNTER — Ambulatory Visit (INDEPENDENT_AMBULATORY_CARE_PROVIDER_SITE_OTHER): Admitting: Family Medicine

## 2024-04-22 VITALS — BP 92/64 | HR 77 | Temp 98.4°F | Ht 66.0 in | Wt 164.0 lb

## 2024-04-22 DIAGNOSIS — Z6826 Body mass index (BMI) 26.0-26.9, adult: Secondary | ICD-10-CM | POA: Diagnosis not present

## 2024-04-22 DIAGNOSIS — K5900 Constipation, unspecified: Secondary | ICD-10-CM | POA: Diagnosis not present

## 2024-04-22 DIAGNOSIS — E669 Obesity, unspecified: Secondary | ICD-10-CM | POA: Diagnosis not present

## 2024-04-22 DIAGNOSIS — G4733 Obstructive sleep apnea (adult) (pediatric): Secondary | ICD-10-CM

## 2024-04-22 DIAGNOSIS — K5901 Slow transit constipation: Secondary | ICD-10-CM

## 2024-04-22 MED ORDER — TIRZEPATIDE-WEIGHT MANAGEMENT 5 MG/0.5ML ~~LOC~~ SOAJ
5.0000 mg | SUBCUTANEOUS | 0 refills | Status: DC
  Filled 2024-04-22 – 2024-05-01 (×4): qty 2, 28d supply, fill #0

## 2024-04-22 NOTE — Progress Notes (Signed)
 Office: [phone]  /  Fax: [phone]  WEIGHT SUMMARY AND BIOMETRICS  Anthropometric Measurements Height: 5' 6 (1.676 m) Weight: 164 lb (74.4 kg) BMI (Calculated): 26.48 Weight at Last Visit: 160 lb Weight Lost Since Last Visit: 0 Weight Gained Since Last Visit: 4 lb Starting Weight: 199 lb Total Weight Loss (lbs): 35 lb (15.9 kg) Peak Weight: 226 lb   Body Composition  Body Fat %: 41.9 % Fat Mass (lbs): 69 lbs Muscle Mass (lbs): 91 lbs Total Body Water  (lbs): 72.8 lbs Visceral Fat Rating : 10   Other Clinical Data Fasting: no Labs: no Today's Visit #: 20 Starting Date: 12/11/22    Chief Complaint: OBESITY   Discussed the use of AI scribe software for clinical note transcription with the patient, who gave verbal consent to proceed.  History of Present Illness Molly Giles is a 68 year old female with obesity who presents for a follow-up on her obesity treatment plan and progress assessment.  She follows her prescribed category one eating plan about fifty percent of the time and engages in physical activity for fifteen to thirty minutes five times a week, focusing on physical therapy hip exercises. Despite these efforts, she has gained four pounds in the last six weeks since her last visit.  She experiences significant constipation, noting she has not had a bowel movement since Saturday. She has been taking Senna, initially two a day, then reduced to one a day, for constipation following hip surgery, but it is no longer effective. She started using Miralax  yesterday to address constipation but is unsure if it will be sufficient. She has no history of constipation prior to starting her current diet and medication regimen.  She experiences constant hunger and feels she is not getting enough satiety from her current medication dose, which is 2.5 mg, and she is currently out of this medication. She has been using vials for her medication due to cost  considerations and has experience with self-administering injections for migraines.  She has a history of sleep apnea, recently retested and found to be moderate to severe, with an AHI of 27.2. She previously lost forty pounds, which improved her sleep apnea from severe to moderate to severe.      PHYSICAL EXAM:  Blood pressure 92/64, pulse 77, temperature 98.4 F (36.9 C), height 5' 6 (1.676 m), weight 164 lb (74.4 kg), SpO2 98%. Body mass index is 26.47 kg/m.  DIAGNOSTIC DATA REVIEWED:  BMET    Component Value Date/Time   NA 144 09/10/2023 0731   K 4.2 09/10/2023 0731   CL 107 (H) 09/10/2023 0731   CO2 22 09/10/2023 0731   GLUCOSE 79 09/10/2023 0731   GLUCOSE 93 07/05/2019 1629   BUN 20 09/10/2023 0731   CREATININE 0.71 09/10/2023 0731   CREATININE 0.74 02/24/2016 1158   CALCIUM  9.3 09/10/2023 0731   GFRNONAA 65 07/24/2019 1116   GFRNONAA 88 02/24/2016 1158   GFRAA 75 07/24/2019 1116   GFRAA >89 02/24/2016 1158   Lab Results  Component Value Date   HGBA1C 5.2 09/10/2023   HGBA1C 5.3 03/22/2017   Lab Results  Component Value Date   INSULIN  8.0 09/10/2023   INSULIN  10.5 03/22/2017   Lab Results  Component Value Date   TSH 2.290 10/18/2023   CBC    Component Value Date/Time   WBC 6.4 08/04/2020 0000   WBC 6.6 07/05/2019 1629   RBC 4.26 08/04/2020 0000   HGB 13.5 08/04/2020 0000   HGB 12.6  05/07/2018 1057   HCT 41 08/04/2020 0000   HCT 38.8 05/07/2018 1057   PLT 262 08/04/2020 0000   MCV 95.0 07/05/2019 1629   MCV 90 05/07/2018 1057   MCH 30.0 07/05/2019 1629   MCHC 31.6 07/05/2019 1629   RDW 13.8 07/05/2019 1629   RDW 14.0 05/07/2018 1057   Iron  Studies No results found for: IRON , TIBC, FERRITIN, IRONPCTSAT Lipid Panel     Component Value Date/Time   CHOL 166 09/10/2023 0731   TRIG 95 09/10/2023 0731   HDL 51 09/10/2023 0731   CHOLHDL 3.3 10/11/2021 1142   CHOLHDL 3.9 12/14/2014 1036   VLDL 26 12/14/2014 1036   LDLCALC 97 09/10/2023  0731   Hepatic Function Panel     Component Value Date/Time   PROT 6.6 09/10/2023 0731   ALBUMIN 4.3 09/10/2023 0731   AST 21 09/10/2023 0731   ALT 21 09/10/2023 0731   ALKPHOS 84 09/10/2023 0731   BILITOT 0.3 09/10/2023 0731   BILIDIR 0.14 02/12/2020 1056      Component Value Date/Time   TSH 2.290 10/18/2023 1212   Nutritional Lab Results  Component Value Date   VD25OH 103.0 (H) 09/10/2023   VD25OH 41.2 12/11/2022   VD25OH 43.3 05/07/2018     Assessment and Plan Assessment & Plan Obesity Following a category one eating plan about 50% of the time and exercises 15-30 minutes five times a week. Despite these efforts, she has gained four pounds in the last six weeks. Reports constant hunger and insufficient satiety, indicating a need for adjustment in her current treatment plan. Currently on a 2.5 mg dose of Zepbound  medication, which she has run out of.  - Work on following the Cat 1 eating plan more closely, eating EVERYTHING on the plan to avoid muscle loss and drop in RMR. -Continue working on exercise and Behavioral Modifications as discussed  Obstructive sleep apnea, moderate Moderate obstructive sleep apnea with an AHI of 27.2. Retested and condition has improved from severe to moderate. Treatment plan involves addressing sleep apnea through weight loss, facilitated by the medication prescribed for obesity. The medication is prescribed for sleep apnea, which indirectly aids weight loss. - Increase medication dose to 5 mg weekly. - Initiate prior authorization for medication coverage under the indication of sleep apnea. - Prescribe medication in pen form for ease of use, with instructions on administration and storage. - Send prescription to [facility] for better availability and potential mail delivery service.  Constipation Reports significant constipation, not having a bowel movement since Saturday. Has been using Senna, which is no longer effective, and has  started Miralax . Constipation may be exacerbated by medication, which slows down the GI tract. Miralax  is better at preventing constipation than fixing it once present. - Continue Miralax  daily to prevent constipation, with the understanding that it takes 3-4 days to be effective. - Ensure adequate hydration to facilitate bowel movements. - Monitor bowel movement frequency and consistency, and adjust treatment if necessary.  I have personally spent 40 minutes total time today in preparation, patient care, and documentation for this visit, including the following: review of clinical lab tests; review of medical tests/procedures/services and nutritional counseling for all three health problems discussed today.    She was informed of the importance of frequent follow up visits to maximize her success with intensive lifestyle modifications for her multiple health conditions.    Louann Penton, MD

## 2024-04-23 ENCOUNTER — Ambulatory Visit: Payer: Self-pay | Admitting: Rehabilitative and Restorative Service Providers"

## 2024-04-23 ENCOUNTER — Telehealth (HOSPITAL_COMMUNITY): Payer: Self-pay

## 2024-04-23 ENCOUNTER — Other Ambulatory Visit (HOSPITAL_COMMUNITY): Payer: Self-pay

## 2024-04-23 ENCOUNTER — Telehealth (INDEPENDENT_AMBULATORY_CARE_PROVIDER_SITE_OTHER): Payer: Self-pay | Admitting: Family Medicine

## 2024-04-23 DIAGNOSIS — M25551 Pain in right hip: Secondary | ICD-10-CM

## 2024-04-23 DIAGNOSIS — M6281 Muscle weakness (generalized): Secondary | ICD-10-CM | POA: Diagnosis not present

## 2024-04-23 DIAGNOSIS — R2689 Other abnormalities of gait and mobility: Secondary | ICD-10-CM

## 2024-04-23 NOTE — Therapy (Signed)
 OUTPATIENT PHYSICAL THERAPY LOWER EXTREMITY TREATMENT  Patient Name: Molly Giles MRN: [id number] DOB:09/04/1956, 68 y.o., female Today's Date: 04/23/2024  END OF SESSION:  PT End of Session - 04/23/24 1450     Visit Number 11    Number of Visits 16    Date for PT Re-Evaluation 05/17/24    Authorization Type medicare and BCBS supplemental    Progress Note Due on Visit 20    PT Start Time 1446    PT Stop Time 1530    PT Time Calculation (min) 44 min    Activity Tolerance Patient tolerated treatment well    Behavior During Therapy [place] for tasks assessed/performed           Past Medical History:  Diagnosis Date   Abdominal bloating 05/10/2021   Abdominal discomfort, generalized 05/07/2016   Acquired hallux rigidus of right foot 09/15/2022   Acute gastritis 11/19/2020   Allergic rhinoconjunctivitis 07/02/2015   Anal fissure 11/19/2020   Anterior to posterior tear of superior glenoid labrum of left shoulder 02/22/2021   Arthritis    Arthritis of carpometacarpal (CMC) joint of left thumb 09/07/2023   Aseptic loosening of prosthetic hip, initial encounter (HCC) 11/29/2017   Benign essential hypertension 10/28/2012   Benign positional vertigo 04/2015   Responded well to [facility]   Bruxism (teeth grinding)    Cervical spondylosis 11/18/2018   Chronic contact dermatitis 08/06/2018   Per allergist Dr Almarie Scala.    Chronic interstitial cystitis 11/09/2011   Managed by [facility] urology branch in [place] hydrodistention 5 years ago with Dr Claudene in [place], has relief of symptoms until now     Chronic low back pain 05/14/2019   Chronic migraine 02/25/2016   Per Dr Ines review in notes from [facility] from September 2015. Showed total headache days last month 18. Severe headache days 7 days. Moderate headache days 5 days. Mild headache days last month sick days. Days without headache last month 10 days. Symptoms associated with photophobia, phonophobia,  osmophobia, neck pain, dizziness, jaw pain, nasal congestion, vision disturbances, tingling and numbness, weakness and worsening with activity. Each headache attack last 3 hours depending on treatment in severity. Left side, the right side, easier side, the frontal area in the back of the head. Characterized as throbbing, pressure, tightness, squeezing, stabbing and burning    Chronic migraine without aura 05/15/2013    Dr Ines [facility]   Chronic tonsillitis 01/27/2019   DDD (degenerative disc disease), cervical 11/18/2018   DDD (degenerative disc disease), lumbar    Depression    Disc displacement, lumbar    Dysphagia 10/11/2021   Dysrhythmia    seen by dr Blanca- not a problem since she has been on Bystolic    Epiretinal membrane (ERM) of left eye 10/17/2021   Periodic ophthalmological monitoring     Episodic cluster headache, not intractable 03/06/2017   Essential hypertension 05/15/2013   Family history of adverse reaction to anesthesia    Brother- N/V   Family history of premature CAD 05/15/2013   Fibromyalgia 10/01/2013   Management by Dr GORMAN. Devashwar (Rhe[facility])     Fibromyalgia syndrome 07/01/2015   Management by Dr GORMAN. Devashwar ([facility]he[facility])    GERD (gastroesophageal reflux disease) 12/16/2014   H/O seasonal allergies    Hallux rigidus of right foot 09/25/2022   Hammer toe    Left great toe   Hashimoto's thyroiditis    Per patient, diagnosed by Dr. Sharyne Pacini   Hearing loss of both  ears 07/27/2015   mild to borderline moderate low frequency hearing loss improving to within normal limits bilaterally on audiology testing at [facility] in November 2016.     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   Hyperlipidemia 1998   Hypokalemia due to excessive gastrointestinal loss of potassium 07/28/2019   Hypothyroidism    Iliopsoas bursitis of left hip 05/08/2022   Impairment of balance 02/2015   Consequent of postconcussive syndrome   Injury of triangular fibrocartilage complex (TFCC) 02/25/2019   Injury of triangular fibrocartilage complex of left wrist 02/25/2019   Dx 02/25/19 Prentice Pagan IV MD ([facility])   Insulin  resistance 07/04/2017   Internal hemorrhoid 01/28/2021   Internal hemorrhoid seen on colonoscopy 10/2020 REGINOLD Sol MD [facility])   Interstitial cystitis    Irritable bowel syndrome with diarrhea 04/03/2016   Keratoconjunctivitis sicca 11/18/2018   (+) ANA     Left ventricular hypertrophy, mild 02/25/2016   ECHOcardiogram report 06/17/15 showing EF55-60%, mild LVH and G1DD    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HCC) 03/10/2018   [facility]   Low serum vitamin B12 01/10/2023   Lumbar facet joint pain    Meibomian gland dysfunction (MGD) of both eyes 10/17/2021   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  Added automatically from request for surgery [id number]     Formatting of this note might be different from the original.  Added automatically from request for surgery [id number]     Obesity (BMI 30.0-34.9) 01/29/2017   Odynophagia 12/20/2021   OSA (obstructive sleep apnea) 12/27/2022   Osteoarthritis of acromioclavicular joint 02/22/2021    Osteoarthritis of left hip 11/01/2015   MRI order by Dr Ernie (ortho) 10/2015 showed significant arthritis of left hip joint with cystic changes in femoral head c/w osteoarthritis   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, lumbar region 10/30/2011   Other insomnia 11/09/2016   Overweight    Pain in joint of left shoulder 11/09/2016   Pain in joint, multiple sites 11/18/2018   Palpitations 05/15/2013   [facility] manages   Perianal candidiasis 12/20/2021   Periodic limb movement sleep disorder 03/28/2017   Perirectal cyst 05/07/2016   Plantar fasciitis of right foot 11/10/2022   Poison ivy dermatitis 03/24/2021   PONV (postoperative nausea and vomiting)    Positive ANA (antinuclear antibody) 11/18/2018   Post concussion syndrome 06/06/2015   Post concussive syndrome 07/14/2015   Ms Sedberry's post-concussive syndrome manifesting in vertigo and headache, mood changes, poor balance, dizziness, and decreased concentration per Dr Ines at [facility].    Posterior vitreous detachment of right eye 2014   Premature menopause 12/20/2021   Primary osteoarthritis of left knee 06/20/2018   Psychophysiological insomnia 11/07/2022   Pure hypercholesterolemia 05/15/2013  Rectal abscess 05/10/2021   S/P left THA, AA 07/25/2016   S/P revision of total hip 04/10/2018   S/P tendon repair 01/23/2024   Sacroiliac inflammation (HCC) 08/18/2021   Severe obstructive sleep apnea-hypopnea syndrome 12/22/2022   Shingles    Shortness of breath dyspnea    with exertion   Sicca syndrome (HCC) 11/18/2018   (+) ANA   Sjogren syndrome (HCC) 01/27/2019   Sjogren's syndrome (HCC) 01/27/2019   Sleep walking and eating 03/06/2017   Snoring 03/06/2017   Spondylosis of lumbar region without myelopathy or radiculopathy 10/30/2011   Status post total hip replacement, left 09/20/2017   TFC (triangular fibrocartilage complex) injury 02/25/2019   Thyroid  nodule 08/11/2009   Findings:  The thyroid  gland is within normal limits in size.  The gland is diffusely inhomogeneous. A small solid nodule is noted in the lower pole  medially on the right of 7 x 6 x 8 mm. A small solid nodule is noted inferiorly on the left of 3 x 3 x 4 mm.  IMPRESSION:  The thyroid  gland is within normal limits in size with only small solid nodules present, the largest of only 8 mm in diameter on the right.     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    Trochanteric bursitis, right hip 04/26/2020   Tubular adenoma of colon 01/28/2021   Colonoscopy screening 4 mm tubular Adenoma polyp Marilu Sol, MD [facility])   Tubular adenoma of colon 01/28/2021   Colonoscopy screening 4 mm tubular Adenoma polyp Marilu Sol, MD [facility])   Vasomotor symptoms due to menopause 04/19/2017   Vertigo 03/19/2023   Vitamin D  deficiency 05/08/2017   Vulvitis 07/22/2020   Yeast vaginitis 07/11/2019   Past Surgical History:  Procedure Laterality Date   arthroscopy Left 01/2022   with SAD and DCR, K. Supple MD   Bladder dilitation     x 3   BREAST BIOPSY Right 2011   Benign histology   CARDIOVASCULAR STRESS TEST  2000   Unremarkable per pt report   CARPOMETACARPAL JOINT ARTHROTOMY Right 2011   COLONOSCOPY     COLONOSCOPY WITH PROPOFOL  N/A 04/21/2015   Procedure: COLONOSCOPY WITH PROPOFOL ;  Surgeon: Belvie Just, MD;  Location: [facility] ENDOSCOPY;  Service: Endoscopy;  Laterality: N/A;   CYSTOSCOPY W/ DILATION OF BLADDER N/A    EPIDURAL BLOCK INJECTION Left 04/12/2016   Left Medial Nerve Block and Left L5 ramus block, Dr Charlie Dolores    EPIDURAL BLOCK INJECTION  03/21/2016   Left L3-4 medial branch block and Left L5 & dorsal ramus block    EPIDURAL BLOCK INJECTION N/A 10/25/2016   Charlie Dolores, MD. Lumbar medial branch block   EPIDURAL BLOCK INJECTION N/A 02/09/2017   Charlie Dolores, MD.  Bilateral L3/4 medial branch block, bilateral L5 dorsal ramus  block   EPIDURAL BLOCK INJECTION N/A 07/04/2017   Charlie Dolores, MD   EXCISION MORTON'S NEUROMA Right 05/03/2023   Procedure: EXCISION MORTON'S NEUROMA;  Surgeon: Kit Rush, MD;  Location: [facility];  Service: Orthopedics;  Laterality: Right;  general, local by surgeon 60 MIN   FECAL TRANSPLANT  04/21/2015   Procedure: FECAL TRANSPLANT;  Surgeon: Belvie Just, MD;  Location: [facility] ENDOSCOPY;  Service: Endoscopy;;   HIP ADDUCTOR TENOTOMY  01/2024   HIP ARTHROPLASTY Left    HIP ARTHROSCOPY Left 03/06/2018   Left hip arthroplasty, redo for loose hip arthroplasty. Procedure at [facility]  INJECTION HIP INTRA ARTICULAR Left 11/2015   for OA by Dr Charlie Bonner REDBIRD IMPLANT PLACEMENT  04/2021   Norleen Sharps MD ([facility])   INTERSTIM IMPLANT REVISION N/A 09/2021   Norleen Sharps MD ([facility])   LIGAMENT REPAIR Right 05/03/2023   Procedure: COLLATERAL LIGAMENT REPAIR;  Surgeon: Kit Norleen, MD;  Location: [facility];  Service: Orthopedics;  Laterality: Right;  general, local by surgeon 60 MIN   OTHER SURGICAL HISTORY Left 2016   Left L3/L4 medial nerve block and Left L5 Dorsal Ramus block Dr FABIENE Bonner   TOTAL HIP ARTHROPLASTY Left 07/25/2016   Procedure: LEFT TOTAL HIP ARTHROPLASTY ANTERIOR APPROACH;  Surgeon: Donnice Car, MD;  Location: [facility];  Service: Orthopedics;  Laterality: Left;   WEIL OSTEOTOMY Right 05/03/2023   Procedure: RIGHT 2ND WEIL OSTEOTOMY;  Surgeon: Kit Norleen, MD;  Location: [facility];  Service: Orthopedics;  Laterality: Right;  general, local by surgeon 60 MIN   Patient Active Problem List   Diagnosis Date Noted   Chronic pain of left upper extremity 02/19/2024   Arthritis of carpometacarpal [facility]) joint of left thumb 09/07/2023   BMI 26.0-26.9,adult 02/07/2023   Low serum vitamin B12 01/10/2023   OSA (obstructive sleep apnea) 12/27/2022   Severe obstructive sleep apnea-hypopnea  syndrome 12/22/2022   Hyperglycemia 12/11/2022   Generalized obesity 12/11/2022   SOBOE (shortness of breath on exertion) 12/11/2022   Other fatigue 12/11/2022   Sleep related headaches 11/07/2022   Intractable episodic cluster headache 11/07/2022   Psychophysiological insomnia 11/07/2022   Hallux rigidus of right foot 09/25/2022   Chronic pain of right knee 05/08/2022   Arthrosis of hand 12/20/2021   History of Clostridioides difficile colitis, required fecal transplantation 12/20/2021   Meibomian gland dysfunction (MGD) of both eyes 10/17/2021   Nuclear sclerosis of both eyes 10/17/2021   Epiretinal membrane (ERM) of left eye 10/17/2021   OAB (overactive bladder) 03/02/2021   Osteoarthritis of acromioclavicular joint 02/22/2021   Hyperhidrosis, scalp, 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   Chronic low back pain 05/14/2019   Sjogren syndrome (HCC) 01/27/2019   DDD (degenerative disc disease), cervical 12/27/2018   Cervical spondylosis 11/18/2018   DDD (degenerative disc disease), lumbar 11/18/2018   Keratoconjunctivitis sicca 11/18/2018   Chronic contact dermatitis 08/06/2018   Primary osteoarthritis of left knee 06/20/2018   Insulin  resistance 07/04/2017   Irritable bowel syndrome with diarrhea 04/03/2016   Left ventricular hypertrophy, mild 02/25/2016   Allergic rhinoconjunctivitis 07/02/2015   History of colonic polyps 07/01/2015   GERD (gastroesophageal reflux disease) 12/16/2014   Fibromyalgia 10/01/2013   Mood disorder (HCC) 10/01/2013   Hyperlipidemia 10/01/2013   Pure hypercholesterolemia 05/15/2013   Chronic migraine without aura 05/15/2013   Benign essential hypertension 10/28/2012   Hypothyroidism 11/09/2011   Chronic interstitial cystitis 11/09/2011    PCP: McDiarmid, Krystal, MD REFERRING PROVIDER: Daniel Norleen, PA-C REFERRING DIAG: s/p tendon repair Z 98.890  THERAPY DIAG:  Muscle weakness (generalized)  Other abnormalities of gait and mobility  Pain in right  hip  Rationale for Evaluation and Treatment: Rehabilitation  ONSET DATE: 01/23/24   SUBJECTIVE:  SUBJECTIVE STATEMENT: The patient reports vertigo has been present and has limited her activity over the weekend. The patient is feeling soreness in her R QL  MD NOTE:  In physical therapy will work on abductor strengthening and gait training. Will work on balance. Will wean off of a walker on to a cane. Will stay on a cane  until is walking without a limp.   PERTINENT HISTORY: HTN, hypercholesterolemia, fibromyalgia, migraines, arthritis, concussion PAIN:  Are you having pain? Yes: NPRS scale: 4/10 Pain location: Rt QL,low back Pain description: achy and sore Aggravating factors: walking Relieving factors: none--mild discomfort  PRECAUTIONS: Other: The patient will be TDWB x 6 weeks, Abductor hip precautions x 6 weeks to the right side.  WEIGHT BEARING RESTRICTIONS: Yes initially TDWB x 6 weeks (she is beyond that per surgical date)  FALLS:  Has patient fallen in last 6 months? No  LIVING ENVIRONMENT: Lives with: lives with their spouse Lives in: House/apartment Stairs: Yes: Internal: 12-14 steps; on right going up Has following equipment at home: Single point cane  PATIENT GOALS: No pain, no cane  NEXT MD VISIT: early September  OBJECTIVE:  Note: Objective measures were completed at Evaluation unless otherwise noted.  PATIENT SURVEYS:  LEFS : 47.5%  COGNITION: Overall cognitive status: Within functional limits for tasks assessed     SENSATION: WFL  POSTURE: No Significant postural limitations  PALPATION: Mild soreness in lateral hip  LOWER EXTREMITY ROM: AROM to 90 degrees hip flexion Passive ROM Right eval Left eval Right 03/27/24 Right 04/07/24  Hip flexion 95  In quadriped 120 deg Can sit into child's pose  Hip extension      Hip abduction 35     Hip adduction      Hip internal rotation 30     Hip external rotation 30     Knee flexion      Knee  extension      Ankle dorsiflexion      Ankle plantarflexion      Ankle inversion      Ankle eversion       (Blank rows = not tested)  LOWER EXTREMITY MMT: did not test due to post surgical status-- will assess as we initiate strengthening progression in PT MMT Right eval Left eval  Hip flexion    Hip extension    Hip abduction    Hip adduction    Hip internal rotation    Hip external rotation    Knee flexion    Knee extension    Ankle dorsiflexion    Ankle plantarflexion    Ankle inversion    Ankle eversion     (Blank rows = not tested)  BALANCE: R single limb x 2 seconds  L single limb x 10 seconds  GAIT: Distance walked: 150 ft Assistive device utilized: Single point cane and None Level of assistance: Modified independence Comments: 3.2 ft/sec-- patient does start off without a limp and after 100 ft has a R antalgic gait pattern *PT and patient discussed still needing the cane when outdoors and in the evening when fatigued   [facility] Adult PT Treatment:                                                DATE: 04/23/24 Therapeutic Exercise: Quadriped Hip extension x 10 reps R and L Prone Hip extension x 10 reps R and L Quad stretch x R and L sides Supine SLR x 10 reps R leg SLR with ER-- creates some groin discomfort so stopped Standing QL stretch doorframe Straight leg dead lift reaching towards a chair back x 10 reps Single leg hip openers-- challenging to maintain stability Sidelying SLR R x 10 reps (2 yoga  blocks under foot to reduce ROM) Neuromuscular re-ed: POSITIONAL TESTING: R dixhallpike=negative L dix hallpike=positive for upbeat, rotary nystagmus Canolith repositioning maneuver: L epley's maneuver x 2 reps Therapeutic Activity: Step ups 4 step x 8 reps R and L anterior into contralateral marching Leg press x 45# x 12 reps, 65 # x 8 reps Backwards Walking with resisted pulleys 5# x 5 reps  Anterior walking with resisted pulleys 5# x 5  reps Gait: Treadmill x 4.5 minutes at 2% incline x 1.5 mph   [facility] Adult PT Treatment:                                                DATE: 04/21/24 Therapeutic Exercise: Supine SLR x 10 reps  Sidelying Hip abduction x 10 reps PT Re-eval: POSITIONAL TESTING: R dix hallpike: upbeating, R rotary nystagmus L dix hallpike: upbeating, L rotary nystagmus Canolith repositioning maneuver: R epley's Retested with no nystagmus in room light Habituation  brandt daroff x 2 reps to each side to review for HEP Gait: x 50 feet looking at pattern, stairs x 12 steps reciprocal pattern with one handrail Gait x 200 ft with cues for glut activation   [facility] Adult PT Treatment:                                                DATE: 04/14/24 Therapeutic Exercise: Standing IR/ER on stool Quadriped Hip extension x 10 reps Sidelying Open book position x 10 reps Supine SLR x 5 reps x 2 sets Bent knee fallout x 5 reps R Hip adductor stretch hooklying Therapeutic Activity: Standing Step ups anterior x 10 reps Steps ups lateral x 5 reps 4 steps R side (gets QL pain), L sides x 5 reps Leg press 45# x 12 reps 65# x 6 reps Gait: Gait activities x 600 ft with antalgic pattern beginning at 220 ft. Treadmill 2% incline up to 1.5 mph  [facility] Adult PT Treatment:                                                DATE: 04/10/24 Therapeutic Exercise: Standing Isolated hip IR/ER Quadriped Rocking anterior/posterior Hip extension with cues for core engagement Therapeutic Activity: Squat + sidestepping x 10 reps x 2 sets Step ups anteriorly with contralateral LE marching -- fatigues at 10 reps R and L sides with intermittent UE support Lateral step ups lead to pain in R QL so did not perform  Sit<>stand x 10 reps without UE support Leg press x 45 # x 20 reps Manual: Trunk rotation and shoulder rotation in opposite directions for transverse rotation to reduce QL pain Gait: Treadmill x 4 minutes at 2% incline x  1.5 mph working on space between feet with gait activities Functional gait with farmer's carry 5# kettle bells in each hand x 160 ft-- note antalgic gait begin  Resisted gait posterior walking 5# at pulley with belt x 8 reps   PATIENT EDUCATION:  Education details: HEP review Person educated: Patient Education method: Explanation Education comprehension: verbalized understanding  HOME EXERCISE PROGRAM: Access Code: [facility] URL: [email or web address]  Date: 04/23/2024 Prepared by: Tawni Ferrier  Program Notes n/a  Exercises - Quadruped Rocking Backward  - 1 x daily - 5 x weekly - 1 sets - 10 reps - Standing Hip Extension with Counter Support  - 1 x daily - 5 x weekly - 1 sets - 10 reps - Standing Hip Abduction with Counter Support  - 1 x daily - 5 x weekly - 1 sets - 10 reps - Side Stepping with Resistance at Ankles  - 1 x daily - 4 x weekly - 2 sets - 10 reps - Standing Shoulder and Trunk Flexion at Table  - 1 x daily - 5 x weekly - 1 sets - 2 reps - 20 seconds hold - Supine Active Straight Leg Raise  - 1 x daily - 5 x weekly - 1 sets - 10 reps - Clamshell  - 1 x daily - 5 x weekly - 2 sets - 10 reps - Sidelying Open Book Thoracic Lumbar Rotation and Extension  - 1 x daily - 5 x weekly - 1 sets - 10 reps - Prone Hip Extension  - 1 x daily - 5 x weekly - 2 sets - 10 reps - Towel Scrunches  - 1 x daily - 4-5 x weekly - 1 sets - 10 reps - Seated Plantar Fascia Mobilization with Small Ball  - 1 x daily - 4-5 x weekly - 1 sets - 10 reps - Seated Arch Lifts  - 1 x daily - 4-5 x weekly - 1 sets - 10 reps - Standing Quadratus Lumborum Stretch with Doorway  - 1 x daily - 5 x weekly - 1 sets - 3 reps - 20 seconds hold  ASSESSMENT: CLINICAL IMPRESSION: The patient is continuing to progress with strengthening in therapy and with walking greater distances outside of therapy. She is getting less R QL pain. In therapy, she feels fatigue in R glut with therapeutic exercise. PT  to continue to progress to STGs/LTGs.   EVAL: Patient is a 68 y.o. female who was seen today for physical therapy evaluation and treatment s/p R glut med repair. She is 6.5 weeks post op today and cleared for WBAT and to begin strengthening. PT initiated HEP today for gentle ROM, glut activations, and to also address R QL pain. Patient feels that cane use is flaring other pain including L 1st MCP joint, L shoulder, and R QL. PT discussed the rationale for cane use discussing goal to normalize gait and avoid an antalgic pattern, therefore, patient to use when in the community and in the evening if she is fatigued. PT to address deficits and promote return to prior functional status.   OBJECTIVE IMPAIRMENTS: Abnormal gait, decreased activity tolerance, decreased balance, decreased mobility, difficulty walking, decreased ROM, decreased strength, increased fascial restrictions, impaired flexibility, and pain.   GOALS: Goals reviewed with patient? Yes  SHORT TERM GOALS: Target date: 04/16/24  The patient will be indep with initial HEP Baseline: initiated at eval Goal status:MET  2.  The patient will improve LEFS by 8% to demonstrate improved functional abilities. Baseline:  47.5% Goal status: MET= at 62.5% on 8/4  3.   The patient will  demonstrate normal gait pattern with no evidence of antalgic pattern x 500 ft to d/c cane for community distances. Baseline:  Gets antalgic pattern after 100 ft in clinic Goal status: PARTIALLY MET  4.  The patient will maintain single leg standing R side x 8 seconds to demo good hip stability for  functional loading. Baseline:  2 seconds, L is 10 seconds. Goal status: MET on 03/27/24 10 seconds each side  LONG TERM GOALS: Target date: 05/17/24  The patient will be indep with progression of HEP. Baseline:  initiated at eval Goal status: INITIAL  2.  The patient will improve LEFS by 15% to demonstrate improved functional abilities. Baseline: 47.5% Goal status:  INITIAL  3.  The patient will report no pain in R quadratus lumborum region demonstrating reduced compensatory strategies with gait. Baseline:  Is using QL for hip hike and has pain at eval Goal status: INITIAL  4.  The patient will be able to negotiate 12 steps with one rail and reciprocal pattern mod indep. Baseline:  has done steps 1x since d/c home Goal status: MET  5.  The patient will demo improved R LE strength demonstrating SLR x 10 reps. Baseline:   to assess MMT Goal status: PARTIALLY MET-- hard to control with eccentric control   6. The patient will have negative positional testing indicating resolution of BPPV.  Baseline: + R BPPV  Goal status: NEW  PLAN:  PT FREQUENCY: 2x/week  PT DURATION: 8 weeks  PLANNED INTERVENTIONS: 97164- PT Re-evaluation, 97750- Physical Performance Testing, 97110-Therapeutic exercises, 97530- Therapeutic activity, [id number]- Neuromuscular re-education, [id number]- Self Care, [id number]- Manual therapy, [phone]- Gait training, [phone]- Aquatic Therapy, [phone]- Electrical stimulation (unattended), [phone] (1-2 muscles), [phone] (3+ muscles)- Dry Needling, Patient/Family education, Balance training, Stair training, Taping, Joint mobilization, Cryotherapy, and Moist heat  PLAN FOR NEXT SESSION: Progress HEP for R hip strengthening, normalize gait pattern without device, work on QL lengthening due to pain, work on stairs and R LE stance control.  treadmill working on incline for negotiating driveway Check BPPV as needed  Kay Shippy, PT 04/23/24 3:43 PM

## 2024-04-23 NOTE — Telephone Encounter (Signed)
 Pharmacy Patient Advocate Encounter  Received notification from [facility] that Prior Authorization for Zepbound  5MG /0.5ML pen-injectors  has been APPROVED from 04/23/24 to 04/23/25. Ran test claim, Copay is $514.46. This test claim was processed through [facility]- copay amounts may vary at other pharmacies due to pharmacy/plan contracts, or as the patient moves through the different stages of their insurance plan.   PA #/Case ID/Reference #: [id number]  *spoke with patient about price and she said that they were thinking it wouldn't be that much. Upon looking into this further it appears that the patient is in the donut hole.She received the 2.5 mg dose through [employer], not sure if this is an option for her.

## 2024-04-23 NOTE — Telephone Encounter (Signed)
 8/13 [employer] called for medicaid approval for Zepbound  5ml injection [employer] member Melia [phone] option 5

## 2024-04-23 NOTE — Telephone Encounter (Signed)
 PA request has been Received. New Encounter has been or will be created for follow up. For additional info see Pharmacy Prior Auth telephone encounter from 04/23/24.

## 2024-04-23 NOTE — Telephone Encounter (Signed)
 Medication was approved through medicare.

## 2024-04-23 NOTE — Telephone Encounter (Signed)
 Pharmacy Patient Advocate Encounter   Received notification from Pt Calls Messages that prior authorization for Zepbound  5MG /0.5ML pen-injectors  is required/requested.   Insurance verification completed.   The patient is insured through [employer] .   Per test claim: PA required; PA submitted to above mentioned insurance via Latent Key/confirmation #/[id number] Status is pending

## 2024-04-24 NOTE — Telephone Encounter (Signed)
 Patient notified via mychart

## 2024-04-24 NOTE — Telephone Encounter (Signed)
 Thank you for the info, much appreciated!

## 2024-04-27 ENCOUNTER — Ambulatory Visit (INDEPENDENT_AMBULATORY_CARE_PROVIDER_SITE_OTHER): Admitting: Neurology

## 2024-04-27 DIAGNOSIS — M797 Fibromyalgia: Secondary | ICD-10-CM

## 2024-04-27 DIAGNOSIS — G43719 Chronic migraine without aura, intractable, without status migrainosus: Secondary | ICD-10-CM

## 2024-04-27 DIAGNOSIS — M47812 Spondylosis without myelopathy or radiculopathy, cervical region: Secondary | ICD-10-CM

## 2024-04-27 DIAGNOSIS — G4733 Obstructive sleep apnea (adult) (pediatric): Secondary | ICD-10-CM | POA: Diagnosis not present

## 2024-04-27 DIAGNOSIS — M3503 Sicca syndrome with myopathy: Secondary | ICD-10-CM

## 2024-04-28 ENCOUNTER — Encounter: Payer: Self-pay | Admitting: Rehabilitative and Restorative Service Providers"

## 2024-04-28 ENCOUNTER — Ambulatory Visit: Admitting: Rehabilitative and Restorative Service Providers"

## 2024-04-28 DIAGNOSIS — M6281 Muscle weakness (generalized): Secondary | ICD-10-CM | POA: Diagnosis not present

## 2024-04-28 DIAGNOSIS — R2689 Other abnormalities of gait and mobility: Secondary | ICD-10-CM

## 2024-04-28 DIAGNOSIS — M25551 Pain in right hip: Secondary | ICD-10-CM

## 2024-04-28 NOTE — Therapy (Signed)
 OUTPATIENT PHYSICAL THERAPY LOWER EXTREMITY TREATMENT  Patient Name: Emory Leaver MRN: [id number] DOB:09/11/1956, 68 y.o., female Today's Date: 04/28/2024  END OF SESSION:  PT End of Session - 04/28/24 1317     Visit Number 12    Number of Visits 16    Date for PT Re-Evaluation 05/17/24    Authorization Type medicare and BCBS supplemental    Progress Note Due on Visit 20    PT Start Time 1317    PT Stop Time 1408    PT Time Calculation (min) 51 min    Activity Tolerance Patient tolerated treatment well    Behavior During Therapy WFL for tasks assessed/performed           Past Medical History:  Diagnosis Date   Abdominal bloating 05/10/2021   Abdominal discomfort, generalized 05/07/2016   Acquired hallux rigidus of right foot 09/15/2022   Acute gastritis 11/19/2020   Allergic rhinoconjunctivitis 07/02/2015   Anal fissure 11/19/2020   Anterior to posterior tear of superior glenoid labrum of left shoulder 02/22/2021   Arthritis    Arthritis of carpometacarpal (CMC) joint of left thumb 09/07/2023   Aseptic loosening of prosthetic hip, initial encounter (HCC) 11/29/2017   Benign essential hypertension 10/28/2012   Benign positional vertigo 04/2015   Responded well to [facility]   Bruxism (teeth grinding)    Cervical spondylosis 11/18/2018   Chronic contact dermatitis 08/06/2018   Per allergist Dr Almarie Scala.    Chronic interstitial cystitis 11/09/2011   Managed by [facility] urology branch in [place] hydrodistention 5 years ago with Dr Claudene in [place], has relief of symptoms until now     Chronic low back pain 05/14/2019   Chronic migraine 02/25/2016   Per Dr Ines review in notes from [facility] from September 2015. Showed total headache days last month 18. Severe headache days 7 days. Moderate headache days 5 days. Mild headache days last month sick days. Days without headache last month 10 days. Symptoms associated with photophobia, phonophobia,  osmophobia, neck pain, dizziness, jaw pain, nasal congestion, vision disturbances, tingling and numbness, weakness and worsening with activity. Each headache attack last 3 hours depending on treatment in severity. Left side, the right side, easier side, the frontal area in the back of the head. Characterized as throbbing, pressure, tightness, squeezing, stabbing and burning    Chronic migraine without aura 05/15/2013    Dr Ines [facility]   Chronic tonsillitis 01/27/2019   DDD (degenerative disc disease), cervical 11/18/2018   DDD (degenerative disc disease), lumbar    Depression    Disc displacement, lumbar    Dysphagia 10/11/2021   Dysrhythmia    seen by dr Blanca- not a problem since she has been on Bystolic    Epiretinal membrane (ERM) of left eye 10/17/2021   Periodic ophthalmological monitoring     Episodic cluster headache, not intractable 03/06/2017   Essential hypertension 05/15/2013   Family history of adverse reaction to anesthesia    Brother- N/V   Family history of premature CAD 05/15/2013   Fibromyalgia 10/01/2013   Management by Dr GORMAN. Devashwar (Rhe[facility])     Fibromyalgia syndrome 07/01/2015   Management by Dr GORMAN. Devashwar ([facility])    GERD (gastroesophageal reflux disease) 12/16/2014   H/O seasonal allergies    Hallux rigidus of right foot 09/25/2022   Hammer toe    Left great toe   Hashimoto's thyroiditis    Per patient, diagnosed by Dr. Sharyne Pacini   Hearing loss of both  ears 07/27/2015   mild to borderline moderate low frequency hearing loss improving to within normal limits bilaterally on audiology testing at [facility] in November 2016.     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   Hyperlipidemia 1998   Hypokalemia due to excessive gastrointestinal loss of potassium 07/28/2019   Hypothyroidism    Iliopsoas bursitis of left hip 05/08/2022   Impairment of balance 02/2015   Consequent of postconcussive syndrome   Injury of triangular fibrocartilage complex (TFCC) 02/25/2019   Injury of triangular fibrocartilage complex of left wrist 02/25/2019   Dx 02/25/19 Prentice Pagan IV MD ([facility])   Insulin  resistance 07/04/2017   Internal hemorrhoid 01/28/2021   Internal hemorrhoid seen on colonoscopy 10/2020 REGINOLD Sol MD [facility])   Interstitial cystitis    Irritable bowel syndrome with diarrhea 04/03/2016   Keratoconjunctivitis sicca 11/18/2018   (+) ANA     Left ventricular hypertrophy, mild 02/25/2016   ECHOcardiogram report 06/17/15 showing EF55-60%, mild LVH and G1DD    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HCC) 03/10/2018   [facility]   Low serum vitamin B12 01/10/2023   Lumbar facet joint pain    Meibomian gland dysfunction (MGD) of both eyes 10/17/2021   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  Added automatically from request for surgery [id number]     Formatting of this note might be different from the original.  Added automatically from request for surgery [id number]     Obesity (BMI 30.0-34.9) 01/29/2017   Odynophagia 12/20/2021   OSA (obstructive sleep apnea) 12/27/2022   Osteoarthritis of acromioclavicular joint 02/22/2021    Osteoarthritis of left hip 11/01/2015   MRI order by Dr Ernie (ortho) 10/2015 showed significant arthritis of left hip joint with cystic changes in femoral head c/w osteoarthritis   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, lumbar region 10/30/2011   Other insomnia 11/09/2016   Overweight    Pain in joint of left shoulder 11/09/2016   Pain in joint, multiple sites 11/18/2018   Palpitations 05/15/2013   [facility] manages   Perianal candidiasis 12/20/2021   Periodic limb movement sleep disorder 03/28/2017   Perirectal cyst 05/07/2016   Plantar fasciitis of right foot 11/10/2022   Poison ivy dermatitis 03/24/2021   PONV (postoperative nausea and vomiting)    Positive ANA (antinuclear antibody) 11/18/2018   Post concussion syndrome 06/06/2015   Post concussive syndrome 07/14/2015   Ms Potvin's post-concussive syndrome manifesting in vertigo and headache, mood changes, poor balance, dizziness, and decreased concentration per Dr Ines at [facility].    Posterior vitreous detachment of right eye 2014   Premature menopause 12/20/2021   Primary osteoarthritis of left knee 06/20/2018   Psychophysiological insomnia 11/07/2022   Pure hypercholesterolemia 05/15/2013  Rectal abscess 05/10/2021   S/P left THA, AA 07/25/2016   S/P revision of total hip 04/10/2018   S/P tendon repair 01/23/2024   Sacroiliac inflammation (HCC) 08/18/2021   Severe obstructive sleep apnea-hypopnea syndrome 12/22/2022   Shingles    Shortness of breath dyspnea    with exertion   Sicca syndrome (HCC) 11/18/2018   (+) ANA   Sjogren syndrome (HCC) 01/27/2019   Sjogren's syndrome (HCC) 01/27/2019   Sleep walking and eating 03/06/2017   Snoring 03/06/2017   Spondylosis of lumbar region without myelopathy or radiculopathy 10/30/2011   Status post total hip replacement, left 09/20/2017   TFC (triangular fibrocartilage complex) injury 02/25/2019   Thyroid  nodule 08/11/2009   Findings:  The thyroid  gland is within normal limits in size.  The gland is diffusely inhomogeneous. A small solid nodule is noted in the lower pole  medially on the right of 7 x 6 x 8 mm. A small solid nodule is noted inferiorly on the left of 3 x 3 x 4 mm.  IMPRESSION:  The thyroid  gland is within normal limits in size with only small solid nodules present, the largest of only 8 mm in diameter on the right.     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    Trochanteric bursitis, right hip 04/26/2020   Tubular adenoma of colon 01/28/2021   Colonoscopy screening 4 mm tubular Adenoma polyp Marilu Sol, MD [facility])   Tubular adenoma of colon 01/28/2021   Colonoscopy screening 4 mm tubular Adenoma polyp Marilu Sol, MD [facility])   Vasomotor symptoms due to menopause 04/19/2017   Vertigo 03/19/2023   Vitamin D  deficiency 05/08/2017   Vulvitis 07/22/2020   Yeast vaginitis 07/11/2019   Past Surgical History:  Procedure Laterality Date   arthroscopy Left 01/2022   with SAD and DCR, K. Supple MD   Bladder dilitation     x 3   BREAST BIOPSY Right 2011   Benign histology   CARDIOVASCULAR STRESS TEST  2000   Unremarkable per pt report   CARPOMETACARPAL JOINT ARTHROTOMY Right 2011   COLONOSCOPY     COLONOSCOPY WITH PROPOFOL  N/A 04/21/2015   Procedure: COLONOSCOPY WITH PROPOFOL ;  Surgeon: Belvie Just, MD;  Location: [facility];  Service: Endoscopy;  Laterality: N/A;   CYSTOSCOPY W/ DILATION OF BLADDER N/A    EPIDURAL BLOCK INJECTION Left 04/12/2016   Left Medial Nerve Block and Left L5 ramus block, Dr Charlie Dolores    EPIDURAL BLOCK INJECTION  03/21/2016   Left L3-4 medial branch block and Left L5 & dorsal ramus block    EPIDURAL BLOCK INJECTION N/A 10/25/2016   Charlie Dolores, MD. Lumbar medial branch block   EPIDURAL BLOCK INJECTION N/A 02/09/2017   Charlie Dolores, MD.  Bilateral L3/4 medial branch block, bilateral L5 dorsal ramus  block   EPIDURAL BLOCK INJECTION N/A 07/04/2017   Charlie Dolores, MD   EXCISION MORTON'S NEUROMA Right 05/03/2023   Procedure: EXCISION MORTON'S NEUROMA;  Surgeon: Kit Rush, MD;  Location: [facility];  Service: Orthopedics;  Laterality: Right;  general, local by surgeon 60 MIN   FECAL TRANSPLANT  04/21/2015   Procedure: FECAL TRANSPLANT;  Surgeon: Belvie Just, MD;  Location: [facility] END[facility]COPY;  Service: Endoscopy;;   HIP ADDUCTOR TENOTOMY  01/2024   HIP ARTHROPLASTY Left    HIP ARTHROSCOPY Left 03/06/2018   Left hip arthroplasty, redo for loose hip arthroplasty. Procedure at [facility]  INJECTION HIP INTRA ARTICULAR Left 11/2015   for OA by Dr Charlie Bonner REDBIRD IMPLANT PLACEMENT  04/2021   Norleen Sharps MD ([facility])   INTERSTIM IMPLANT REVISION N/A 09/2021   Norleen Sharps MD ([facility])   LIGAMENT REPAIR Right 05/03/2023   Procedure: COLLATERAL LIGAMENT REPAIR;  Surgeon: Kit Norleen, MD;  Location: [facility];  Service: Orthopedics;  Laterality: Right;  general, local by surgeon 60 MIN   OTHER SURGICAL HISTORY Left 2016   Left L3/L4 medial nerve block and Left L5 Dorsal Ramus block Dr FABIENE Bonner   TOTAL HIP ARTHROPLASTY Left 07/25/2016   Procedure: LEFT TOTAL HIP ARTHROPLASTY ANTERIOR APPROACH;  Surgeon: Donnice Car, MD;  Location: [facility];  Service: Orthopedics;  Laterality: Left;   WEIL OSTEOTOMY Right 05/03/2023   Procedure: RIGHT 2ND WEIL OSTEOTOMY;  Surgeon: Kit Norleen, MD;  Location: [facility];  Service: Orthopedics;  Laterality: Right;  general, local by surgeon 60 MIN   Patient Active Problem List   Diagnosis Date Noted   Chronic pain of left upper extremity 02/19/2024   Arthritis of carpometacarpal [facility]) joint of left thumb 09/07/2023   BMI 26.0-26.9,adult 02/07/2023   Low serum vitamin B12 01/10/2023   OSA (obstructive sleep apnea) 12/27/2022   Severe obstructive sleep apnea-hypopnea  syndrome 12/22/2022   Hyperglycemia 12/11/2022   Generalized obesity 12/11/2022   SOBOE (shortness of breath on exertion) 12/11/2022   Other fatigue 12/11/2022   Sleep related headaches 11/07/2022   Intractable episodic cluster headache 11/07/2022   Psychophysiological insomnia 11/07/2022   Hallux rigidus of right foot 09/25/2022   Chronic pain of right knee 05/08/2022   Arthrosis of hand 12/20/2021   History of Clostridioides difficile colitis, required fecal transplantation 12/20/2021   Meibomian gland dysfunction (MGD) of both eyes 10/17/2021   Nuclear sclerosis of both eyes 10/17/2021   Epiretinal membrane (ERM) of left eye 10/17/2021   OAB (overactive bladder) 03/02/2021   Osteoarthritis of acromioclavicular joint 02/22/2021   Hyperhidrosis, scalp, 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   Chronic low back pain 05/14/2019   Sjogren syndrome (HCC) 01/27/2019   DDD (degenerative disc disease), cervical 12/27/2018   Cervical spondylosis 11/18/2018   DDD (degenerative disc disease), lumbar 11/18/2018   Keratoconjunctivitis sicca 11/18/2018   Chronic contact dermatitis 08/06/2018   Primary osteoarthritis of left knee 06/20/2018   Insulin  resistance 07/04/2017   Irritable bowel syndrome with diarrhea 04/03/2016   Left ventricular hypertrophy, mild 02/25/2016   Allergic rhinoconjunctivitis 07/02/2015   History of colonic polyps 07/01/2015   GERD (gastroesophageal reflux disease) 12/16/2014   Fibromyalgia 10/01/2013   Mood disorder (HCC) 10/01/2013   Hyperlipidemia 10/01/2013   Pure hypercholesterolemia 05/15/2013   Chronic migraine without aura 05/15/2013   Benign essential hypertension 10/28/2012   Hypothyroidism 11/09/2011   Chronic interstitial cystitis 11/09/2011    PCP: McDiarmid, Krystal, MD REFERRING PROVIDER: Daniel Norleen, PA-C REFERRING DIAG: s/p tendon repair Z 98.890  THERAPY DIAG:  Muscle weakness (generalized)  Other abnormalities of gait and mobility  Pain in right  hip  Rationale for Evaluation and Treatment: Rehabilitation  ONSET DATE: 01/23/24   SUBJECTIVE:  SUBJECTIVE STATEMENT: The patient reports exercises are going well and no pain in the hip. The patient's vertigo is cleared at this time. The patient does feel soreness in lumbar region R and L sides now.  PERTINENT HISTORY: HTN, hypercholesterolemia, fibromyalgia, migraines, arthritis, concussion PAIN:  Are you having pain? Yes: NPRS scale: 0/10 in hip, 5/10 in low  back Pain location: Rt QL,low back Pain description: achy and sore Aggravating factors: walking Relieving factors: none--mild discomfort  PRECAUTIONS: Other: The patient will be TDWB x 6 weeks, Abductor hip precautions x 6 weeks to the right side.  WEIGHT BEARING RESTRICTIONS: Yes initially TDWB x 6 weeks (she is beyond that per surgical date)  FALLS:  Has patient fallen in last 6 months? No  LIVING ENVIRONMENT: Lives with: lives with their spouse Lives in: House/apartment Stairs: Yes: Internal: 12-14 steps; on right going up Has following equipment at home: Single point cane  PATIENT GOALS: No pain, no cane  NEXT MD VISIT: early September  OBJECTIVE:  Note: Objective measures were completed at Evaluation unless otherwise noted.  PATIENT SURVEYS:  LEFS : 47.5%  COGNITION: Overall cognitive status: Within functional limits for tasks assessed     SENSATION: WFL  POSTURE: No Significant postural limitations  PALPATION: Mild soreness in lateral hip  LOWER EXTREMITY ROM: AROM to 90 degrees hip flexion Passive ROM Right eval Left eval Right 03/27/24 Right 04/07/24  Hip flexion 95  In quadriped 120 deg Can sit into child's pose  Hip extension      Hip abduction 35     Hip adduction      Hip internal rotation 30     Hip external rotation 30     Knee flexion      Knee extension      Ankle dorsiflexion      Ankle plantarflexion      Ankle inversion      Ankle eversion       (Blank rows = not  tested)  LOWER EXTREMITY MMT: did not test due to post surgical status-- will assess as we initiate strengthening progression in PT MMT Right eval Left eval  Hip flexion 4/5 5/5  Hip extension 3+/5 4+/5  Hip abduction 4/5 *unable to tolerate laying on R side *detect weakness with h/o THR 2018  Hip adduction    Hip internal rotation 4/5 5/5  Hip external rotation 3+/5 3/5  Knee flexion 3+/5 4+/5  Knee extension 5/5 5/5  Ankle dorsiflexion 5/5 5/5  Ankle plantarflexion    Ankle inversion    Ankle eversion     (Blank rows = not tested)  BALANCE: R single limb x 2 seconds  L single limb x 10 seconds  GAIT: Distance walked: 150 ft Assistive device utilized: Single point cane and None Level of assistance: Modified independence Comments: 3.2 ft/sec-- patient does start off without a limp and after 100 ft has a R antalgic gait pattern *PT and patient discussed still needing the cane when outdoors and in the evening when fatigued  [facility] Adult PT Treatment:                                                DATE: 04/28/24 Therapeutic Exercise: MMT-see chart Seated  Hip ER R and L Supine SLR R and L x 12 reps Sidelying SLR R x 10 reps (doesn't tolerate laying on R side to do L abduction) Prone SLR R and L x 12 reps Manual Therapy: Educated on scar tissue massage and provided handout Therapeutic Activity: Standing side stepping with red band x 10 feet x 2 reps Standing hip activiation in SLS performing hip abduction with red band and hip extension with red band x 10 reps each Standing  knee flexion with red theraband x 10 reps R and L QL stretch in door frame Gait: Treadmill x 5 minutes at 1.9 mph and 2% incline   [facility] Adult PT Treatment:                                                DATE: 04/23/24 Therapeutic Exercise: Quadriped Hip extension x 10 reps R and L Prone Hip extension x 10 reps R and L Quad stretch x R and L sides Supine SLR x 10 reps R leg SLR with ER--  creates some groin discomfort so stopped Standing QL stretch doorframe Straight leg dead lift reaching towards a chair back x 10 reps Single leg hip openers-- challenging to maintain stability Sidelying SLR R x 10 reps (2 yoga blocks under foot to reduce ROM) Neuromuscular re-ed: POSITIONAL TESTING: R dixhallpike=negative L dix hallpike=positive for upbeat, rotary nystagmus Canolith repositioning maneuver: L epley's maneuver x 2 reps Therapeutic Activity: Step ups 4 step x 8 reps R and L anterior into contralateral marching Leg press x 45# x 12 reps, 65 # x 8 reps Backwards Walking with resisted pulleys 5# x 5 reps  Anterior walking with resisted pulleys 5# x 5 reps Gait: Treadmill x 4.5 minutes at 2% incline x 1.5 mph   [facility] Adult PT Treatment:                                                DATE: 04/21/24 Therapeutic Exercise: Supine SLR x 10 reps  Sidelying Hip abduction x 10 reps PT Re-eval: POSITIONAL TESTING: R dix hallpike: upbeating, R rotary nystagmus L dix hallpike: upbeating, L rotary nystagmus Canolith repositioning maneuver: R epley's Retested with no nystagmus in room light Habituation  brandt daroff x 2 reps to each side to review for HEP Gait: x 50 feet looking at pattern, stairs x 12 steps reciprocal pattern with one handrail Gait x 200 ft with cues for glut activation   [facility] Adult PT Treatment:                                                DATE: 04/14/24 Therapeutic Exercise: Standing IR/ER on stool Quadriped Hip extension x 10 reps Sidelying Open book position x 10 reps Supine SLR x 5 reps x 2 sets Bent knee fallout x 5 reps R Hip adductor stretch hooklying Therapeutic Activity: Standing Step ups anterior x 10 reps Steps ups lateral x 5 reps 4 steps R side (gets QL pain), L sides x 5 reps Leg press 45# x 12 reps 65# x 6 reps Gait: Gait activities x 600 ft with antalgic pattern beginning at 220 ft. Treadmill 2% incline up to 1.5  mph   PATIENT EDUCATION:  Education details: HEP review Person educated: Patient Education method: Explanation Education comprehension: verbalized understanding  HOME EXERCISE PROGRAM: Access Code: [facility] URL: [email or web address] Date: 04/28/2024 Prepared by: Tawni Ferrier  Exercises - Side Stepping with Resistance at Ankles  - 2 x daily - 3-4 x weekly - 1 sets - 10 reps - Standing Hip Extension with Resistance at Ankles and  Counter Support  - 2 x daily - 3-4 x weekly - 1 sets - 10 reps - Standing Hamstring Curl with Resistance  - 2 x daily - 3-4 x weekly - 1 sets - 10 reps - Standing Quadratus Lumborum Stretch with Doorway  - 1 x daily - 3-4 x weekly - 1 sets - 3 reps - 20 seconds hold - Supine Active Straight Leg Raise  - 1 x daily - 3-4 x weekly - 1 sets - 10 reps - Sidelying Hip Abduction  - 2 x daily - 3-4 x weekly - 1 sets - 10 reps - Prone Hip Extension  - 1 x daily - 3-4 x weekly - 2 sets - 10 reps - Seated Hip External Rotation AROM  - 1 x daily - 3-4 x weekly - 1 sets - 10 reps  Patient Education - Scar Massage  ASSESSMENT: CLINICAL IMPRESSION: The patient tolerated MMT well today with focus of HEP on specific impairments in strength still noted. We also found the L hip ER and abductors are weak-- potentially from prior THR. Patient encouraged to do exercises on both Les. PT to continue to work towards LTGs-- will discuss transition to [employer] for community wellness program.  EVAL: Patient is a 68 y.o. female who was seen today for physical therapy evaluation and treatment s/p R glut med repair. She is 6.5 weeks post op today and cleared for WBAT and to begin strengthening. PT initiated HEP today for gentle ROM, glut activations, and to also address R QL pain. Patient feels that cane use is flaring other pain including L 1st MCP joint, L shoulder, and R QL. PT discussed the rationale for cane use discussing goal to normalize gait and avoid an  antalgic pattern, therefore, patient to use when in the community and in the evening if she is fatigued. PT to address deficits and promote return to prior functional status.   OBJECTIVE IMPAIRMENTS: Abnormal gait, decreased activity tolerance, decreased balance, decreased mobility, difficulty walking, decreased ROM, decreased strength, increased fascial restrictions, impaired flexibility, and pain.   GOALS: Goals reviewed with patient? Yes  SHORT TERM GOALS: Target date: 04/16/24  The patient will be indep with initial HEP Baseline: initiated at eval Goal status:MET  2.  The patient will improve LEFS by 8% to demonstrate improved functional abilities. Baseline:  47.5% Goal status: MET= at 62.5% on 8/4  3.   The patient will  demonstrate normal gait pattern with no evidence of antalgic pattern x 500 ft to d/c cane for community distances. Baseline:  Gets antalgic pattern after 100 ft in clinic Goal status: PARTIALLY MET  4.  The patient will maintain single leg standing R side x 8 seconds to demo good hip stability for functional loading. Baseline:  2 seconds, L is 10 seconds. Goal status: MET on 03/27/24 10 seconds each side  LONG TERM GOALS: Target date: 05/17/24  The patient will be indep with progression of HEP. Baseline:  initiated at eval Goal status: INITIAL  2.  The patient will improve LEFS by 15% to demonstrate improved functional abilities. Baseline: 47.5% Goal status: INITIAL  3.  The patient will report no pain in R quadratus lumborum region demonstrating reduced compensatory strategies with gait. Baseline:  Is using QL for hip hike and has pain at eval Goal status: INITIAL  4.  The patient will be able to negotiate 12 steps with one rail and reciprocal pattern mod indep. Baseline:  has done steps 1x since d/c  home Goal status: MET  5.  The patient will demo improved R LE strength demonstrating SLR x 10 reps. Baseline:   to assess MMT Goal status: PARTIALLY MET--  hard to control with eccentric control   6. The patient will have negative positional testing indicating resolution of BPPV.  Baseline: + R BPPV  Goal status: MET   PLAN:  PT FREQUENCY: 2x/week  PT DURATION: 8 weeks  PLANNED INTERVENTIONS: 97164- PT Re-evaluation, 97750- Physical Performance Testing, 97110-Therapeutic exercises, 97530- Therapeutic activity, [id number]- Neuromuscular re-education, 97535- Self Care, [id number]- Manual therapy, [phone]- Gait training, [phone]- Aquatic Therapy, [phone]- Electrical stimulation (unattended), [phone] (1-2 muscles), [id number] (3+ muscles)- Dry Needling, Patient/Family education, Balance training, Stair training, Taping, Joint mobilization, Cryotherapy, and Moist heat  PLAN FOR NEXT SESSION: Progress HEP for R hip strengthening, normalize gait pattern without device, work on QL lengthening due to pain, work on stairs and R LE stance control.  treadmill working on incline for negotiating driveway  Plan to d/c at end of month if continued progress (or drop to 1x/week-- will assess).   Babetta Paterson, PT 04/28/24 3:07 PM

## 2024-04-30 ENCOUNTER — Telehealth (INDEPENDENT_AMBULATORY_CARE_PROVIDER_SITE_OTHER): Payer: Self-pay | Admitting: Family Medicine

## 2024-04-30 NOTE — Telephone Encounter (Signed)
 Patient would like to have medication sent to the pharmacy.

## 2024-04-30 NOTE — Telephone Encounter (Signed)
 Patient would like a call back from [place] concernig the [facility]. Please call patient at the number on file.

## 2024-05-01 ENCOUNTER — Ambulatory Visit (INDEPENDENT_AMBULATORY_CARE_PROVIDER_SITE_OTHER): Admitting: Family Medicine

## 2024-05-01 ENCOUNTER — Ambulatory Visit: Admitting: Rehabilitative and Restorative Service Providers"

## 2024-05-01 ENCOUNTER — Other Ambulatory Visit (HOSPITAL_COMMUNITY): Payer: Self-pay

## 2024-05-01 ENCOUNTER — Encounter: Payer: Self-pay | Admitting: Rehabilitative and Restorative Service Providers"

## 2024-05-01 DIAGNOSIS — M25551 Pain in right hip: Secondary | ICD-10-CM

## 2024-05-01 DIAGNOSIS — M6281 Muscle weakness (generalized): Secondary | ICD-10-CM | POA: Diagnosis not present

## 2024-05-01 DIAGNOSIS — R2689 Other abnormalities of gait and mobility: Secondary | ICD-10-CM

## 2024-05-01 NOTE — Telephone Encounter (Signed)
 It should be there already, can you check on this?

## 2024-05-01 NOTE — Telephone Encounter (Signed)
 Sent patient a message via my chart.  Attempted to call yesterday.  Left message on voicemail.

## 2024-05-01 NOTE — Therapy (Signed)
 OUTPATIENT PHYSICAL THERAPY LOWER EXTREMITY TREATMENT  Patient Name: Molly Giles MRN: [id number] DOB:1956/01/21, 68 y.o., female Today's Date: 05/01/2024  END OF SESSION:  PT End of Session - 05/01/24 1322     Visit Number 13    Number of Visits 16    Date for PT Re-Evaluation 05/17/24    Authorization Type medicare and BCBS supplemental    Progress Note Due on Visit 20    PT Start Time 1321    PT Stop Time 1405    PT Time Calculation (min) 44 min    Activity Tolerance Patient tolerated treatment well    Behavior During Therapy [facility] for tasks assessed/performed          Past Medical History:  Diagnosis Date   Abdominal bloating 05/10/2021   Abdominal discomfort, generalized 05/07/2016   Acquired hallux rigidus of right foot 09/15/2022   Acute gastritis 11/19/2020   Allergic rhinoconjunctivitis 07/02/2015   Anal fissure 11/19/2020   Anterior to posterior tear of superior glenoid labrum of left shoulder 02/22/2021   Arthritis    Arthritis of carpometacarpal (CMC) joint of left thumb 09/07/2023   Aseptic loosening of prosthetic hip, initial encounter (HCC) 11/29/2017   Benign essential hypertension 10/28/2012   Benign positional vertigo 04/2015   Responded well to [facility]   Bruxism (teeth grinding)    Cervical spondylosis 11/18/2018   Chronic contact dermatitis 08/06/2018   Per allergist Dr Almarie Scala.    Chronic interstitial cystitis 11/09/2011   Managed by [facility] urology branch in [place] hydrodistention 5 years ago with Dr Claudene in [place], has relief of symptoms until now     Chronic low back pain 05/14/2019   Chronic migraine 02/25/2016   Per Dr Ines review in notes from [facility] from September 2015. Showed total headache days last month 18. Severe headache days 7 days. Moderate headache days 5 days. Mild headache days last month sick days. Days without headache last month 10 days. Symptoms associated with photophobia, phonophobia,  osmophobia, neck pain, dizziness, jaw pain, nasal congestion, vision disturbances, tingling and numbness, weakness and worsening with activity. Each headache attack last 3 hours depending on treatment in severity. Left side, the right side, easier side, the frontal area in the back of the head. Characterized as throbbing, pressure, tightness, squeezing, stabbing and burning    Chronic migraine without aura 05/15/2013    Dr Ines [facility]   Chronic tonsillitis 01/27/2019   DDD (degenerative disc disease), cervical 11/18/2018   DDD (degenerative disc disease), lumbar    Depression    Disc displacement, lumbar    Dysphagia 10/11/2021   Dysrhythmia    seen by dr Blanca- not a problem since she has been on Bystolic    Epiretinal membrane (ERM) of left eye 10/17/2021   Periodic ophthalmological monitoring     Episodic cluster headache, not intractable 03/06/2017   Essential hypertension 05/15/2013   Family history of adverse reaction to anesthesia    Brother- N/V   Family history of premature CAD 05/15/2013   Fibromyalgia 10/01/2013   Management by Dr GORMAN. Devashwar (Rheum)     Fibromyalgia syndrome 07/01/2015   Management by Dr GORMAN. Devashwar ([facility]he[facility])    GERD (gastroesophageal reflux disease) 12/16/2014   H/O seasonal allergies    Hallux rigidus of right foot 09/25/2022   Hammer toe    Left great toe   Hashimoto's thyroiditis    Per patient, diagnosed by Dr. Sharyne Pacini   Hearing loss of both ears  07/27/2015   mild to borderline moderate low frequency hearing loss improving to within normal limits bilaterally on audiology testing at [facility] in November 2016.     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   Hyperlipidemia 1998   Hypokalemia due to excessive gastrointestinal loss of potassium 07/28/2019   Hypothyroidism    Iliopsoas bursitis of left hip 05/08/2022   Impairment of balance 02/2015   Consequent of postconcussive syndrome   Injury of triangular fibrocartilage complex (TFCC) 02/25/2019   Injury of triangular fibrocartilage complex of left wrist 02/25/2019   Dx 02/25/19 Prentice Pagan IV MD ([facility])   Insulin  resistance 07/04/2017   Internal hemorrhoid 01/28/2021   Internal hemorrhoid seen on colonoscopy 10/2020 REGINOLD Sol MD [facility])   Interstitial cystitis    Irritable bowel syndrome with diarrhea 04/03/2016   Keratoconjunctivitis sicca 11/18/2018   (+) ANA     Left ventricular hypertrophy, mild 02/25/2016   ECHOcardiogram report 06/17/15 showing EF55-60%, mild LVH and G1DD    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HCC) 03/10/2018   [facility]   Low serum vitamin B12 01/10/2023   Lumbar facet joint pain    Meibomian gland dysfunction (MGD) of both eyes 10/17/2021   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  Added automatically from request for surgery [id number]     Formatting of this note might be different from the original.  Added automatically from request for surgery [id number]     Obesity (BMI 30.0-34.9) 01/29/2017   Odynophagia 12/20/2021   OSA (obstructive sleep apnea) 12/27/2022   Osteoarthritis of acromioclavicular joint 02/22/2021    Osteoarthritis of left hip 11/01/2015   MRI order by Dr Ernie (ortho) 10/2015 showed significant arthritis of left hip joint with cystic changes in femoral head c/w osteoarthritis   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, lumbar region 10/30/2011   Other insomnia 11/09/2016   Overweight    Pain in joint of left shoulder 11/09/2016   Pain in joint, multiple sites 11/18/2018   Palpitations 05/15/2013   [facility] manages   Perianal candidiasis 12/20/2021   Periodic limb movement sleep disorder 03/28/2017   Perirectal cyst 05/07/2016   Plantar fasciitis of right foot 11/10/2022   Poison ivy dermatitis 03/24/2021   PONV (postoperative nausea and vomiting)    Positive ANA (antinuclear antibody) 11/18/2018   Post concussion syndrome 06/06/2015   Post concussive syndrome 07/14/2015   Ms Spoto's post-concussive syndrome manifesting in vertigo and headache, mood changes, poor balance, dizziness, and decreased concentration per Dr Ines at [facility].    Posterior vitreous detachment of right eye 2014   Premature menopause 12/20/2021   Primary osteoarthritis of left knee 06/20/2018   Psychophysiological insomnia 11/07/2022   Pure hypercholesterolemia 05/15/2013  Rectal abscess 05/10/2021   S/P left THA, AA 07/25/2016   S/P revision of total hip 04/10/2018   S/P tendon repair 01/23/2024   Sacroiliac inflammation (HCC) 08/18/2021   Severe obstructive sleep apnea-hypopnea syndrome 12/22/2022   Shingles    Shortness of breath dyspnea    with exertion   Sicca syndrome (HCC) 11/18/2018   (+) ANA   Sjogren syndrome (HCC) 01/27/2019   Sjogren's syndrome (HCC) 01/27/2019   Sleep walking and eating 03/06/2017   Snoring 03/06/2017   Spondylosis of lumbar region without myelopathy or radiculopathy 10/30/2011   Status post total hip replacement, left 09/20/2017   TFC (triangular fibrocartilage complex) injury 02/25/2019   Thyroid  nodule 08/11/2009   Findings:  The thyroid  gland is within normal limits in size.  The gland is diffusely inhomogeneous. A small solid nodule is noted in the lower pole  medially on the right of 7 x 6 x 8 mm. A small solid nodule is noted inferiorly on the left of 3 x 3 x 4 mm.  IMPRESSION:  The thyroid  gland is within normal limits in size with only small solid nodules present, the largest of only 8 mm in diameter on the right.     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    Trochanteric bursitis, right hip 04/26/2020   Tubular adenoma of colon 01/28/2021   Colonoscopy screening 4 mm tubular Adenoma polyp Marilu Sol, MD [facility])   Tubular adenoma of colon 01/28/2021   Colonoscopy screening 4 mm tubular Adenoma polyp Marilu Sol, MD [facility])   Vasomotor symptoms due to menopause 04/19/2017   Vertigo 03/19/2023   Vitamin D  deficiency 05/08/2017   Vulvitis 07/22/2020   Yeast vaginitis 07/11/2019   Past Surgical History:  Procedure Laterality Date   arthroscopy Left 01/2022   with SAD and DCR, K. Supple MD   Bladder dilitation     x 3   BREAST BIOPSY Right 2011   Benign histology   CARDIOVASCULAR STRESS TEST  2000   Unremarkable per pt report   CARPOMETACARPAL JOINT ARTHROTOMY Right 2011   COLONOSCOPY     COLONOSCOPY WITH PROPOFOL  N/A 04/21/2015   Procedure: COLONOSCOPY WITH PROPOFOL ;  Surgeon: Belvie Just, MD;  Location[facility];  Service: Endoscopy;  Laterality: N/A;   CYSTOSCOPY W/ DILATION OF BLADDER N/A    EPIDURAL BLOCK INJECTION Left 04/12/2016   Left Medial Nerve Block and Left L5 ramus block, Dr Charlie Dolores    EPIDURAL BLOCK INJECTION  03/21/2016   Left L3-4 medial branch block and Left L5 & dorsal ramus block    EPIDURAL BLOCK INJECTION N/A 10/25/2016   Charlie Dolores, MD. Lumbar medial branch block   EPIDURAL BLOCK INJECTION N/A 02/09/2017   Charlie Dolores, MD.  Bilateral L3/4 medial branch block, bilateral L5 dorsal ramus  block   EPIDURAL BLOCK INJECTION N/A 07/04/2017   Charlie Dolores, MD   EXCISION MORTON'S NEUROMA Right 05/03/2023   Procedure: EXCISION MORTON'S NEUROMA;  Surgeon: Kit Rush, MD;  Location: [facility];  Service: Orthopedics;  Laterality: Right;  general, local by surgeon 60 MIN   FECAL TRANSPLANT  04/21/2015   Procedure: FECAL TRANSPLANT;  Surgeon: Belvie Just, MD;  Location: [facility] ENDOSCOPY;  Service: Endoscopy;;   HIP ADDUCTOR TENOTOMY  01/2024   HIP ARTHROPLASTY Left    HIP ARTHROSCOPY Left 03/06/2018   Left hip arthroplasty, redo for loose hip arthroplasty. Procedure at [facility] hospital  INJECTION HIP INTRA ARTICULAR Left 11/2015   for OA by Dr Charlie Bonner REDBIRD IMPLANT PLACEMENT  04/2021   Norleen Sharps MD ([facility])   INTERSTIM IMPLANT REVISION N/A 09/2021   Norleen Sharps MD ([facility])   LIGAMENT REPAIR Right 05/03/2023   Procedure: COLLATERAL LIGAMENT REPAIR;  Surgeon: Kit Norleen, MD;  Location: [facility];  Service: Orthopedics;  Laterality: Right;  general, local by surgeon 60 MIN   OTHER SURGICAL HISTORY Left 2016   Left L3/L4 medial nerve block and Left L5 Dorsal Ramus block Dr FABIENE Bonner   TOTAL HIP ARTHROPLASTY Left 07/25/2016   Procedure: LEFT TOTAL HIP ARTHROPLASTY ANTERIOR APPROACH;  Surgeon: Donnice Car, MD;  Location: [facility];  Service: Orthopedics;  Laterality: Left;   WEIL OSTEOTOMY Right 05/03/2023   Procedure: RIGHT 2ND WEIL OSTEOTOMY;  Surgeon: Kit Norleen, MD;  Location:  SURGERY CENTER;  Service: Orthopedics;  Laterality: Right;  general, local by surgeon 60 MIN   Patient Active Problem List   Diagnosis Date Noted   Chronic pain of left upper extremity 02/19/2024   Arthritis of carpometacarpal [facility]) joint of left thumb 09/07/2023   BMI 26.0-26.9,adult 02/07/2023   Low serum vitamin B12 01/10/2023   OSA (obstructive sleep apnea) 12/27/2022   Severe obstructive sleep apnea-hypopnea  syndrome 12/22/2022   Hyperglycemia 12/11/2022   Generalized obesity 12/11/2022   SOBOE (shortness of breath on exertion) 12/11/2022   Other fatigue 12/11/2022   Sleep related headaches 11/07/2022   Intractable episodic cluster headache 11/07/2022   Psychophysiological insomnia 11/07/2022   Hallux rigidus of right foot 09/25/2022   Chronic pain of right knee 05/08/2022   Arthrosis of hand 12/20/2021   History of Clostridioides difficile colitis, required fecal transplantation 12/20/2021   Meibomian gland dysfunction (MGD) of both eyes 10/17/2021   Nuclear sclerosis of both eyes 10/17/2021   Epiretinal membrane (ERM) of left eye 10/17/2021   OAB (overactive bladder) 03/02/2021   Osteoarthritis of acromioclavicular joint 02/22/2021   Hyperhidrosis, scalp, 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   Chronic low back pain 05/14/2019   Sjogren syndrome (HCC) 01/27/2019   DDD (degenerative disc disease), cervical 12/27/2018   Cervical spondylosis 11/18/2018   DDD (degenerative disc disease), lumbar 11/18/2018   Keratoconjunctivitis sicca 11/18/2018   Chronic contact dermatitis 08/06/2018   Primary osteoarthritis of left knee 06/20/2018   Insulin  resistance 07/04/2017   Irritable bowel syndrome with diarrhea 04/03/2016   Left ventricular hypertrophy, mild 02/25/2016   Allergic rhinoconjunctivitis 07/02/2015   History of colonic polyps 07/01/2015   GERD (gastroesophageal reflux disease) 12/16/2014   Fibromyalgia 10/01/2013   Mood disorder (HCC) 10/01/2013   Hyperlipidemia 10/01/2013   Pure hypercholesterolemia 05/15/2013   Chronic migraine without aura 05/15/2013   Benign essential hypertension 10/28/2012   Hypothyroidism 11/09/2011   Chronic interstitial cystitis 11/09/2011    PCP: McDiarmid, Krystal, MD REFERRING PROVIDER: Daniel Norleen, PA-C REFERRING DIAG: s/p tendon repair Z 98.890  THERAPY DIAG:  Muscle weakness (generalized)  Other abnormalities of gait and mobility  Pain in right  hip  Rationale for Evaluation and Treatment: Rehabilitation  ONSET DATE: 01/23/24   SUBJECTIVE:  SUBJECTIVE STATEMENT: The patient has new R proximal hip pain today. She was walking and felt a sudden sharp pain.  She was still able to tolerate HEP without flaring R hip pain.  PERTINENT HISTORY: HTN, hypercholesterolemia, fibromyalgia, migraines, arthritis, concussion PAIN:  Are you having pain? Yes: NPRS scale: 3/10 in proximal R hip (newer onset) Pain location:  Rt QL,low back Pain description: achy and sore Aggravating factors: walking Relieving factors: none--mild discomfort  PRECAUTIONS: Other: The patient will be TDWB x 6 weeks, Abductor hip precautions x 6 weeks to the right side.  WEIGHT BEARING RESTRICTIONS: Yes initially TDWB x 6 weeks (she is beyond that per surgical date)  FALLS:  Has patient fallen in last 6 months? No  LIVING ENVIRONMENT: Lives with: lives with their spouse Lives in: House/apartment Stairs: Yes: Internal: 12-14 steps; on right going up Has following equipment at home: Single point cane  PATIENT GOALS: No pain, no cane  NEXT MD VISIT: early September  OBJECTIVE:  Note: Objective measures were completed at Evaluation unless otherwise noted.  PATIENT SURVEYS:  LEFS : 47.5%  COGNITION: Overall cognitive status: Within functional limits for tasks assessed     SENSATION: WFL  POSTURE: No Significant postural limitations  PALPATION: Mild soreness in lateral hip  LOWER EXTREMITY ROM: AROM to 90 degrees hip flexion Passive ROM Right eval Left eval Right 03/27/24 Right 04/07/24  Hip flexion 95  In quadriped 120 deg Can sit into child's pose  Hip extension      Hip abduction 35     Hip adduction      Hip internal rotation 30     Hip external rotation 30     Knee flexion      Knee extension      Ankle dorsiflexion      Ankle plantarflexion      Ankle inversion      Ankle eversion       (Blank rows = not tested)  LOWER EXTREMITY  MMT: did not test due to post surgical status-- will assess as we initiate strengthening progression in PT MMT Right eval Left eval  Hip flexion 4/5 5/5  Hip extension 3+/5 4+/5  Hip abduction 4/5 *unable to tolerate laying on R side *detect weakness with h/o THR 2018  Hip adduction    Hip internal rotation 4/5 5/5  Hip external rotation 3+/5 3/5  Knee flexion 3+/5 4+/5  Knee extension 5/5 5/5  Ankle dorsiflexion 5/5 5/5  Ankle plantarflexion    Ankle inversion    Ankle eversion     (Blank rows = not tested)  BALANCE: R single limb x 2 seconds  L single limb x 10 seconds  GAIT: Distance walked: 150 ft Assistive device utilized: Single point cane and None Level of assistance: Modified independence Comments: 3.2 ft/sec-- patient does start off without a limp and after 100 ft has a R antalgic gait pattern *PT and patient discussed still needing the cane when outdoors and in the evening when fatigued  [facility] Adult PT Treatment:                                                DATE: 05/01/24 Therapeutic Exercise: Prone Hip extension x 10 reps Seated  Leg press 45# x 12 reps Leg press 65# x 10 reps Supine Thomas test stretch Manual Therapy: Self massage using a massage ball on anterior hip PROM hip IR/ER and quad stretch IASTM R lateral quad, IT Band, lateral hip, glut med Therapeutic Activity: Step ups x 10 reps R and L to 4 steps, increased to 6 step and added contralateral LE marching while dec'ing UE support Resisted walking 5# posterior and anterior x 6 reps each Gait: Treadmill x  5 minutes at 2.0 mph and 2% incline   [facility] Adult PT Treatment:                                                DATE: 04/28/24 Therapeutic Exercise: MMT-see chart Seated  Hip ER R and L Supine SLR R and L x 12 reps Sidelying SLR R x 10 reps (doesn't tolerate laying on R side to do L abduction) Prone SLR R and L x 12 reps Manual Therapy: Educated on scar tissue massage and provided  handout Therapeutic Activity: Standing side stepping with red band x 10 feet x 2 reps Standing hip activiation in SLS performing hip abduction with red band and hip extension with red band x 10 reps each Standing knee flexion with red theraband x 10 reps R and L QL stretch in door frame Gait: Treadmill x 5 minutes at 1.9 mph and 2% incline   [facility] Adult PT Treatment:                                                DATE: 04/23/24 Therapeutic Exercise: Quadriped Hip extension x 10 reps R and L Prone Hip extension x 10 reps R and L Quad stretch x R and L sides Supine SLR x 10 reps R leg SLR with ER-- creates some groin discomfort so stopped Standing QL stretch doorframe Straight leg dead lift reaching towards a chair back x 10 reps Single leg hip openers-- challenging to maintain stability Sidelying SLR R x 10 reps (2 yoga blocks under foot to reduce ROM) Neuromuscular re-ed: POSITIONAL TESTING: R dixhallpike=negative L dix hallpike=positive for upbeat, rotary nystagmus Canolith repositioning maneuver: L epley's maneuver x 2 reps Therapeutic Activity: Step ups 4 step x 8 reps R and L anterior into contralateral marching Leg press x 45# x 12 reps, 65 # x 8 reps Backwards Walking with resisted pulleys 5# x 5 reps  Anterior walking with resisted pulleys 5# x 5 reps Gait: Treadmill x 4.5 minutes at 2% incline x 1.5 mph  PATIENT EDUCATION:  Education details: HEP review Person educated: Patient Education method: Explanation Education comprehension: verbalized understanding  HOME EXERCISE PROGRAM: Access Code: [facility] URL: [email or web address] Date: 04/28/2024 Prepared by: Tawni Ferrier  Exercises - Side Stepping with Resistance at Ankles  - 2 x daily - 3-4 x weekly - 1 sets - 10 reps - Standing Hip Extension with Resistance at Ankles and Counter Support  - 2 x daily - 3-4 x weekly - 1 sets - 10 reps - Standing Hamstring Curl with Resistance  - 2 x daily -  3-4 x weekly - 1 sets - 10 reps - Standing Quadratus Lumborum Stretch with Doorway  - 1 x daily - 3-4 x weekly - 1 sets - 3 reps - 20 seconds hold - Supine Active Straight Leg Raise  - 1 x daily - 3-4 x weekly - 1 sets - 10 reps - Sidelying Hip Abduction  - 2 x daily - 3-4 x weekly - 1 sets - 10 reps - Prone Hip Extension  - 1 x daily - 3-4 x weekly - 2 sets - 10 reps - Seated Hip External Rotation AROM  - 1 x daily - 3-4 x  weekly - 1 sets - 10 reps  Patient Education - Scar Massage  ASSESSMENT: CLINICAL IMPRESSION: The patient is continuing to make progress with activity tolerance and strength. She reports new onset proximal quad/anterior hip pain today after walking in dress shoes. PT reviewed myofascial release options with muscle rolling and stretching. PT and patient discussed reduced frequency to 1x/week and beginning to transition to community exercise.  EVAL: Patient is a 68 y.o. female who was seen today for physical therapy evaluation and treatment s/p R glut med repair. She is 6.5 weeks post op today and cleared for WBAT and to begin strengthening. PT initiated HEP today for gentle ROM, glut activations, and to also address R QL pain. Patient feels that cane use is flaring other pain including L 1st MCP joint, L shoulder, and R QL. PT discussed the rationale for cane use discussing goal to normalize gait and avoid an antalgic pattern, therefore, patient to use when in the community and in the evening if she is fatigued. PT to address deficits and promote return to prior functional status.   OBJECTIVE IMPAIRMENTS: Abnormal gait, decreased activity tolerance, decreased balance, decreased mobility, difficulty walking, decreased ROM, decreased strength, increased fascial restrictions, impaired flexibility, and pain.   GOALS: Goals reviewed with patient? Yes  SHORT TERM GOALS: Target date: 04/16/24  The patient will be indep with initial HEP Baseline: initiated at eval Goal  status:MET  2.  The patient will improve LEFS by 8% to demonstrate improved functional abilities. Baseline:  47.5% Goal status: MET= at 62.5% on 8/4  3.   The patient will  demonstrate normal gait pattern with no evidence of antalgic pattern x 500 ft to d/c cane for community distances. Baseline:  Gets antalgic pattern after 100 ft in clinic Goal status: PARTIALLY MET  4.  The patient will maintain single leg standing R side x 8 seconds to demo good hip stability for functional loading. Baseline:  2 seconds, L is 10 seconds. Goal status: MET on 03/27/24 10 seconds each side  LONG TERM GOALS: Target date: 05/17/24  The patient will be indep with progression of HEP. Baseline:  initiated at eval Goal status: INITIAL  2.  The patient will improve LEFS by 15% to demonstrate improved functional abilities. Baseline: 47.5% Goal status: INITIAL  3.  The patient will report no pain in R quadratus lumborum region demonstrating reduced compensatory strategies with gait. Baseline:  Is using QL for hip hike and has pain at eval Goal status: INITIAL  4.  The patient will be able to negotiate 12 steps with one rail and reciprocal pattern mod indep. Baseline:  has done steps 1x since d/c home Goal status: MET  5.  The patient will demo improved R LE strength demonstrating SLR x 10 reps. Baseline:   to assess MMT Goal status: MET  6. The patient will have negative positional testing indicating resolution of BPPV.  Baseline: + R BPPV  Goal status: MET   PLAN:  PT FREQUENCY: 2x/week  PT DURATION: 8 weeks  PLANNED INTERVENTIONS: [id number]- PT Re-evaluation, 97750- Physical Performance Testing, 97110-Therapeutic exercises, 97530- Therapeutic activity, [id number]- Neuromuscular re-education, 97535- Self Care, [id number]- Manual therapy, [phone]- Gait training, [phone]- Aquatic Therapy, [phone]- Electrical stimulation (unattended), [phone] (1-2 muscles), [id number] (3+ muscles)- Dry Needling, Patient/Family education,  Balance training, Stair training, Taping, Joint mobilization, Cryotherapy, and Moist heat  PLAN FOR NEXT SESSION: Progress HEP for R hip strengthening, normalize gait pattern without device, work on QL lengthening due to pain, work  on stairs and R LE stance control.  treadmill working on incline for negotiating driveway  Plan to d/c at end of month if continued progress (or drop to 1x/week-- will assess).  PT scan/emailed information to [facility] for referral.  LELON MAUS, PT 05/01/24 3:31 PM

## 2024-05-02 ENCOUNTER — Other Ambulatory Visit (HOSPITAL_COMMUNITY): Payer: Self-pay

## 2024-05-04 ENCOUNTER — Ambulatory Visit: Payer: Self-pay | Admitting: Neurology

## 2024-05-04 NOTE — Procedures (Signed)
 [facility] PAP TITRATION INTERPRETATION REPORT   STUDY DATE: 04/27/2024      PATIENT NAME:  Molly Giles         DATE OF BIRTH:  02/28/1956  PATIENT ID:  [id number]    REFERRED BY AMY LOMAX, NP  TYPE OF STUDY:  CPAP titration  READING PHYSICIAN: [name], MD SCORING TECHNICIAN: Jesusa Haddock, RPSGT   HISTORY: This 68 year-old Female patient has not been able to use her auto CPAP compliantly and reports a history of chronic migraines, followed by [name], NP and  is on Vyepti . 10-11 to now 7 migraine days per month reported, lost 40 pounds on Zepbound  and wondered if CPAP may no longer be necessary. The Epworth Sleepiness Scale was endorsed at 0 out of 24 (scores above or equal to 10 are suggestive of hypersomnolence). CPAP auto-set at 5-16cm water , 2 cm EPR and AHI of 2/h  95% is 9 cm water .   DESCRIPTION: A sleep technologist was in attendance for the duration of the recording.  Data collection, scoring, video monitoring, and reporting were performed in compliance with the AASM Manual for the Scoring of Sleep and Associated Events; (Hypopnea is scored based on the criteria listed in Section VIII D. 1b in the AASM Manual V2.6 using a 4% oxygen desaturation rule or Hypopnea is scored based on the criteria listed in Section VIII D. 1a in the AASM Manual V2.6 using 3% oxygen desaturation and /or arousal rule).  A physician certified by the American Board of Sleep Medicine reviewed each epoch of the study.  ADDITIONAL INFORMATION:  Height: 66.0 in Weight: 165 lbs (BMI 26) Neck Size: 0.0     MEDICATIONS: Vitamin C, Celexa , Synthroid , Mobic, Hiprex, Robaxin , Loniten, Macrobid, Maxalt , Crestor , Systane Balance, Zepbound , Tramadol , Uribel, Ambien    SLEEP CONTINUITY AND SLEEP ARCHITECTURE:  Lights off was at 22:13: and lights on 05:30: (7.3 hours in bed). The patient took Xanax  for sleep induction, fitted with an EVORA FFM in XS and CPAP was initiated  at 5 cm water  pressure.   Total sleep time was 295.0 minutes with a sleep efficiency at 67.6%. Sleep latency was, in spite of medication, increased at 89.5 minutes.   Of the total sleep time, the percentage of stage N1 sleep was 5.1%, stage N2 sleep was 70.3%, stage N3 sleep was 13.6%, and REM sleep was 11.0%. There were 1 Stage R periods observed on this study night, 31 awakenings (i.e. transitions to Stage W from any sleep stage), and 101.0 total stage transitions.  Wake after sleep onset (WASO) time accounted for 52 minutes.  AROUSAL: The arousal index of 17.7 arousals/hour.  Of these, 15 were identified as respiratory-related arousals (3.1 /h), 0 were PLM-related arousals (0.0 /h), and 88 were non-specific arousals (17.9 /h).  RESPIRATORY MONITORING:  Based on CMS criteria (using a 4% oxygen desaturation rule for scoring hypopneas), there were 2 apneas (2 obstructive; 0 central; 0 mixed), and 14 hypopneas.  The Apnea index was 0.4/h. The Hypopnea index was 2.8/h. The AHI apnea-hypopnea index was 3.3/h overall (6.4 /h supine, 0.0 non-supine; and severely accentuated to 20.3/h in all- supine REM). There were 0 respiratory effort-related arousals (RERAs).    Based on AASM criteria (using a 3% oxygen desaturation and /or arousal rule for scoring hypopneas), there were 2 apneas (2 obstructive; 0 central; 0 mixed), and 21 hypopneas. Apnea index was 0.4. Hypopnea index was 4.3. The apnea-hypopnea index was 4.7 overall (8.8 supine, 0.0  non-supine; 22.2 REM, 22.2 supine REM). There were 0 respiratory effort-related arousals (RERAs).  The RERA index was 0.0 events/h.  OXIMETRY: Total sleep time spent at, or below 88% was 1.9 minutes, or 0.6% of total sleep time. Respiratory events were associated with oxyhemoglobin desaturations (nadir during sleep 87% from a mean saturation of 96%.  BODY POSITION: Duration of total sleep and percent of total sleep in their respective position is as follows: supine 150 minutes  (50.8%), non-supine 145.0 minutes (49.2%); right 28 minutes (9.5%), left 117 minutes (39.7%), and prone 00 minutes (0.0%). Total supine REM sleep time was 32 minutes (100.0% of total REM sleep). LIMB MOVEMENTS: There were 0 periodic limb movements of sleep (0.0/h), of which 0 (0.0/h) were associated with an arousal.  CARDIAC ECG: The electrocardiogram documented NSR.   The average heart rate during sleep was 64 bpm.  The maximum heart rate during sleep was 81 bpm. The maximum heart rate during recording was 85 bpm.   IMPRESSION:   1.  There was no clinically significant apnea noted under 5 cm water  CPAP pressure with an AHI of 0.7/h.  What was present was hypoxia- not severe, and loud snoring.  CMS AHI was 0/h under 6 cm water  CPAP which reduced the snoring somewhat and reduced hypoxia. Higher CPAP pressures induced more apneas and hypopneas. The weight loss has reduced the need for CPAP to an optional therapy. RECOMMENDATIONS:  This patient can use low- set pressure of 6 cm water  on her CPAP device or forgo CPAP treatment all together.   Snoring can be treated by dental device, chin strap or mouth tape.  Nasal patency is important.  Insomnia is unlikely related to apnea and therefor likely unresponsive to apnea therapy.   [name], MD [facility] CPAP Summary    General Information  Name: Molly Giles, Molly Giles BMI: 26.63 Physician: [name], MD  ID: [id number] Height: 66.0 in Technician: Jesusa Haddock, RPSGT  Sex: Female Weight: 165.0 lb Record: [id number]  Age: 11 [1955-10-04] Date: 04/27/2024     Medical & Medication History    Dody returns for follow up for migraines and OSA on CPAP. She was last seen via Mychart 11/2023 and we increased Vyepti dose to 300mg  q12 weeks. Since, she reports headaches have improved. She is having about 10-11 headache days. About 6-7 are migrainous. Rizatriptan  usually aborts migraine. She has lost 40lbs since 12/2022  on Zepbound . She has not used CPAP in the past month or so. Prior to that she was using fairly consistently. Average usage less than 2 hours. She did not note any significant improvement in sleep quality or with headache management. She continues to have difficulty tolerating mask. She has tried many and feels the FFM is the best fir for her. She is a restless sleeper and feels the mask moves around during the night. Tried and failed: Emgality  (taking now), propranolol, topiramate , nortriptyline , venlafaxine, Ajovy  (ineffective), cant take Amovig (latex allergy) Celexa  (on now), Namenda , Botox (ineffective, droopy eyes), Imitrex , eletriptan , rizatriptan , meloxicam, Cambia, Toradol , prednisone  (caused sunburn feeling), Ubrelvy  and Nurtec. Total AHI : 65.6 /h REM AHI: 55.8/h NREM pAHI: 68/h Positional AHI: 99% of sleep were supine.  Vitamin C, Celexa , Synthorid, Mobic, Hiprex, Robaxin , Loniten, Macrobid, Maxalt , Crestor , Systane Balance, Zepbound , Tramadol , Uribel, Ambien    Sleep Disorder      Comments   The patient came into the sleep lab for a CPAP titration study. The patient took Ambien  and Xanax  prior to sleep. The  patient was fitted with a F&P Evora (FFM) size XS. The interface was a good fit to face, and the patient tolerated the mask well. CPAP was [facility] at [facility] ([place]). CPAP was titrated up to 12cmH2O EPR of 2 due to respiratory events and loud snoring. Two restroom breaks. EKG did not show any obvious cardiac arrhythmias. Loud snoring while supine. All sleep stages witnessed. Respiratory events scored with a 4% desat. The patient slept supine and lateral. No CSA observed.     CPAP start time: 10:17:22 PM CPAP end time: 05:30:12 AM   Time Total Supine Side Prone Upright  Recording (TRT) 7h 13.88m 3h 47.39m 3h 26.16m 0h 0.66m 0h 0.6m  Sleep (TST) 4h 55.72m 2h 30.61m 2h 25.82m 0h 0.70m 0h 0.40m   Latency N1 N2 N3 REM Onset Per. Slp. Eff.  Actual 0h 0.57m 0h 1.71m 0h 37.65m 5h 12.56m 1h 26.57m 1h 36.55m 68.13%    Stg Dur Wake N1 N2 N3 REM  Total 138.0 15.0 207.5 40.0 32.5  Supine 77.0 6.0 111.5 0.0 32.5  Side 61.0 9.0 96.0 40.0 0.0  Prone 0.0 0.0 0.0 0.0 0.0  Upright 0.0 0.0 0.0 0.0 0.0   Stg % Wake N1 N2 N3 REM  Total 31.9 5.1 70.3 13.6 11.0  Supine 17.8 2.0 37.8 0.0 11.0  Side 14.1 3.1 32.5 13.6 0.0  Prone 0.0 0.0 0.0 0.0 0.0  Upright 0.0 0.0 0.0 0.0 0.0     Apnea Summary Sub Supine Side Prone Upright  Total 2 Total 2 2 0 0 0    REM 0 0 0 0 0    NREM 2 2 0 0 0  Obs 2 REM 0 0 0 0 0    NREM 2 2 0 0 0  Mix 0 REM 0 0 0 0 0    NREM 0 0 0 0 0  Cen 0 REM 0 0 0 0 0    NREM 0 0 0 0 0   Rera Summary Sub Supine Side Prone Upright  Total 0 Total 0 0 0 0 0    REM 0 0 0 0 0    NREM 0 0 0 0 0   Hypopnea Summary Sub Supine Side Prone Upright  Total 21 Total 21 20 1  0 0    REM 12 12 0 0 0    NREM 9 8 1  0 0   4% Hypopnea Summary Sub Supine Side Prone Upright  Total (4%) 14 Total 14 14 0 0 0    REM 11 11 0 0 0    NREM 3 3 0 0 0     AHI Total Obs Mix Cen  4.68 Apnea 0.41 0.41 0.00 0.00   Hypopnea 4.27 -- -- --  3.25 Hypopnea (4%) 2.85 -- -- --    Total Supine Side Prone Upright  Position AHI 4.68 8.80 0.41 0.00 0.00  REM AHI 22.15   NREM AHI 2.51   Position RDI 4.68 8.80 0.41 0.00 0.00  REM RDI 22.15   NREM RDI 2.51    4% Hypopnea Total Supine Side Prone Upright  Position AHI (4%) 3.25 6.40 0.00 0.00 0.00  REM AHI (4%) 20.31   NREM AHI (4%) 1.14   Position RDI (4%) 3.25 6.40 0.00 0.00 0.00  REM RDI (4%) 20.31   NREM RDI (4%) 1.14    Desaturation Information  <100% <90% <80% <70% <60% <50% <40%  Supine 78 9 1 0 0 0 0  Side 33 4 0 0 0  0 0  Prone 0 0 0 0 0 0 0  Upright 0 0 0 0 0 0 0  Total 111 13 1 0 0 0 0  Desaturation threshold setting: 3% Minimum desaturation setting: 10 seconds SaO2 nadir: 78% The longest event was a 52 sec obstructive Hypopnea with a minimum SaO2 of 93%. The lowest SaO2 was 50% associated with a 32 sec obstructive Hypopnea. EKG Rates EKG Avg Max  Min  Awake 64 94 56  Asleep 64 81 56  EKG Events: N/A Awakening/Arousal Information # of Awakenings 31  Wake after sleep onset 52.82m  Wake after persistent sleep 45.74m   Arousal Assoc. Arousals Index  Apneas 2 0.4  Hypopneas 13 2.6  Leg Movements 0 0.0  Snore 0.0 0.0  PTT Arousals 0 0.0  Spontaneous 88 17.9  Total 103 20.9  Myoclonus Information PLMS LMs Index  Total LMs during PLMS 0 0.0  LMs w/ Microarousals 0 0.0   LM LMs Index  w/ Microarousal 0 0.0  w/ Awakening 0 0.0  w/ Resp Event 0 0.0  Spontaneous 1 0.2  Total 1 0.2        Pressure IPAP/EPAP 00 05 06 07 08 09 11 12   O2 Vol 0.0 0.0 0.0 0.0 0.0 0.0 0.0 0.0  Time TRT 0.45m 299.33m 26.82m 20.85m 12.27m 48.31m 5.51m 25.29m   TST 0.59m 163.81m 24.83m 19.53m 11.71m 46.14m 5.48m 25.55m  Sleep Stage % Wake 100.0 45.4 5.8 2.5 4.2 4.2 0.0 2.0   % REM 0.0 0.0 0.0 0.0 0.0 8.7 100.0 94.0   % N1 0.0 7.3 2.0 2.6 0.0 4.3 0.0 0.0   % N2 0.0 68.2 98.0 97.4 100.0 87.0 0.0 6.0   % N3 0.0 24.5 0.0 0.0 0.0 0.0 0.0 0.0  Respiratory 3%  Total Events 0 2 2 3  0 6 3 7    Obs. Apn. 0 0 0 0 0 2 0 0   Mixed Apn. 0 0 0 0 0 0 0 0   Cen. Apn. 0 0 0 0 0 0 0 0   Hypopneas 0 2 2 3  0 4 3 7    AHI 0.00 0.73 4.90 9.23 0.00 7.83 36.00 16.80   Supine AHI 0.00 3.24 4.90 9.23 0.00 7.83 36.00 16.80   Prone AHI 0.00 0.00 0.00 0.00 0.00 0.00 0.00 0.00   Side AHI 0.00 0.41 0.00 0.00 0.00 0.00 0.00 0.00  Respiratory (4%) Hypopneas (4%) 0.00 0.00 0.00 2.00 0.00 3.00 2.00 7.00   AHI (4%) 0.00 0.00 0.00 6.15 0.00 6.52 24.00 16.80   Supine AHI (4%) 0.00 0.00 0.00 6.15 0.00 6.52 24.00 16.80   Prone AHI (4%) 0.00 0.00 0.00 0.00 0.00 0.00 0.00 0.00   Side AHI (4%) 0.00 0.00 0.00 0.00 0.00 0.00 0.00 0.00  Desat Profile <= 90% 0.4m 16.22m 0.60m 0.26m 0.7m 0.73m 0.76m 1.84m   <= 80% 0.48m 13.87m 0.22m 0.24m 0.41m 0.60m 0.47m 0.7m   <= 70% 0.72m 13.38m 0.45m 0.66m 0.81m 0.60m 0.72m 0.17m   <= 60% 0.43m 13.60m 0.31m 0.42m 0.44m 0.32m 0.19m 0.80m  Arousal Index Apnea 0.0 0.0 0.0 0.0 0.0 2.6 0.0 0.0    Hypopnea 0.0 0.7 2.4 6.2 0.0 3.9 12.0 9.6   LM 0.0 0.0 0.0 0.0 0.0 0.0 0.0 0.0   Spontaneous 0.0 15.8 19.6 24.6 20.9 20.9 0.0 21.6

## 2024-05-05 ENCOUNTER — Ambulatory Visit

## 2024-05-05 ENCOUNTER — Encounter: Payer: Self-pay | Admitting: Family Medicine

## 2024-05-05 DIAGNOSIS — R2689 Other abnormalities of gait and mobility: Secondary | ICD-10-CM

## 2024-05-05 DIAGNOSIS — M6281 Muscle weakness (generalized): Secondary | ICD-10-CM | POA: Diagnosis not present

## 2024-05-05 DIAGNOSIS — M25551 Pain in right hip: Secondary | ICD-10-CM

## 2024-05-05 NOTE — Therapy (Signed)
 OUTPATIENT PHYSICAL THERAPY LOWER EXTREMITY TREATMENT  Patient Name: Molly Giles MRN: [id number] DOB:Mar 26, 1956, 68 y.o., female Today's Date: 05/05/2024  END OF SESSION:  PT End of Session - 05/05/24 1317     Visit Number 14    Number of Visits 16    Date for PT Re-Evaluation 05/17/24    Authorization Type medicare and BCBS supplemental    Progress Note Due on Visit 20    PT Start Time 1316    PT Stop Time 1359    PT Time Calculation (min) 43 min    Activity Tolerance Patient tolerated treatment well    Behavior During Therapy WFL for tasks assessed/performed           Past Medical History:  Diagnosis Date   Abdominal bloating 05/10/2021   Abdominal discomfort, generalized 05/07/2016   Acquired hallux rigidus of right foot 09/15/2022   Acute gastritis 11/19/2020   Allergic rhinoconjunctivitis 07/02/2015   Anal fissure 11/19/2020   Anterior to posterior tear of superior glenoid labrum of left shoulder 02/22/2021   Arthritis    Arthritis of carpometacarpal (CMC) joint of left thumb 09/07/2023   Aseptic loosening of prosthetic hip, initial encounter (HCC) 11/29/2017   Benign essential hypertension 10/28/2012   Benign positional vertigo 04/2015   Responded well to [facility]   Bruxism (teeth grinding)    Cervical spondylosis 11/18/2018   Chronic contact dermatitis 08/06/2018   Per allergist Dr Almarie Scala.    Chronic interstitial cystitis 11/09/2011   Managed by [facility] urology branch in [place] hydrodistention 5 years ago with Dr Claudene in [place], has relief of symptoms until now     Chronic low back pain 05/14/2019   Chronic migraine 02/25/2016   Per Dr Ines review in notes from [facility] from September 2015. Showed total headache days last month 18. Severe headache days 7 days. Moderate headache days 5 days. Mild headache days last month sick days. Days without headache last month 10 days. Symptoms associated with photophobia, phonophobia,  osmophobia, neck pain, dizziness, jaw pain, nasal congestion, vision disturbances, tingling and numbness, weakness and worsening with activity. Each headache attack last 3 hours depending on treatment in severity. Left side, the right side, easier side, the frontal area in the back of the head. Characterized as throbbing, pressure, tightness, squeezing, stabbing and burning    Chronic migraine without aura 05/15/2013    Dr Ines [facility]   Chronic tonsillitis 01/27/2019   DDD (degenerative disc disease), cervical 11/18/2018   DDD (degenerative disc disease), lumbar    Depression    Disc displacement, lumbar    Dysphagia 10/11/2021   Dysrhythmia    seen by dr Blanca- not a problem since she has been on Bystolic    Epiretinal membrane (ERM) of left eye 10/17/2021   Periodic ophthalmological monitoring     Episodic cluster headache, not intractable 03/06/2017   Essential hypertension 05/15/2013   Family history of adverse reaction to anesthesia    Brother- N/V   Family history of premature CAD 05/15/2013   Fibromyalgia 10/01/2013   Management by Dr GORMAN. Devashwar ([facility]he[facility])     Fibromyalgia syndrome 07/01/2015   Management by Dr GORMAN. Devashwar ([facility])    GERD (gastroesophageal reflux disease) 12/16/2014   H/O seasonal allergies    Hallux rigidus of right foot 09/25/2022   Hammer toe    Left great toe   Hashimoto's thyroiditis    Per patient, diagnosed by Dr. Sharyne Pacini   Hearing loss of both  ears 07/27/2015   mild to borderline moderate low frequency hearing loss improving to within normal limits bilaterally on audiology testing at [facility] in November 2016.     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   Hyperlipidemia 1998   Hypokalemia due to excessive gastrointestinal loss of potassium 07/28/2019   Hypothyroidism    Iliopsoas bursitis of left hip 05/08/2022   Impairment of balance 02/2015   Consequent of postconcussive syndrome   Injury of triangular fibrocartilage complex (TFCC) 02/25/2019   Injury of triangular fibrocartilage complex of left wrist 02/25/2019   Dx 02/25/19 Prentice Pagan IV MD ([facility])   Insulin  resistance 07/04/2017   Internal hemorrhoid 01/28/2021   Internal hemorrhoid seen on colonoscopy 10/2020 REGINOLD Sol MD [facility])   Interstitial cystitis    Irritable bowel syndrome with diarrhea 04/03/2016   Keratoconjunctivitis sicca 11/18/2018   (+) ANA     Left ventricular hypertrophy, mild 02/25/2016   ECHOcardiogram report 06/17/15 showing EF55-60%, mild LVH and G1DD    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HCC) 03/10/2018   [facility]   Low serum vitamin B12 01/10/2023   Lumbar facet joint pain    Meibomian gland dysfunction (MGD) of both eyes 10/17/2021   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  Added automatically from request for surgery [id number]     Formatting of this note might be different from the original.  Added automatically from request for surgery [id number]     Obesity (BMI 30.0-34.9) 01/29/2017   Odynophagia 12/20/2021   OSA (obstructive sleep apnea) 12/27/2022   Dr Dohmeier polysomnography titration study 04/27/24  There  was no clinically significant apnea noted under 5 cm water  CPAP pressure with an AHI of 0.7/h.  What was present was hypoxia- not severe, and loud snoring.   CMS AHI was 0/h under 6 cm water  CPAP which reduced the snoring somewhat and reduced hypoxia. Higher CPAP pressures induced more apneas and hypopneas.  The weight loss has reduced the   Osteoarthritis of acromioclavicular joint 02/22/2021   Osteoarthritis of left hip 11/01/2015   MRI order by Dr Ernie (ortho) 10/2015 showed significant arthritis of left hip joint with cystic changes in femoral head c/w osteoarthritis   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, lumbar region 10/30/2011   Other insomnia 11/09/2016   Overweight    Pain in joint of left shoulder 11/09/2016   Pain in joint, multiple sites 11/18/2018   Palpitations 05/15/2013    Cardiology manages   Perianal candidiasis 12/20/2021   Periodic limb movement sleep disorder 03/28/2017   Perirectal cyst 05/07/2016   Plantar fasciitis of right foot 11/10/2022   Poison ivy dermatitis 03/24/2021   PONV (postoperative nausea and vomiting)    Positive ANA (antinuclear  antibody) 11/18/2018   Post concussion syndrome 06/06/2015   Post concussive syndrome 07/14/2015   Ms Newhard's post-concussive syndrome manifesting in vertigo and headache, mood changes, poor balance, dizziness, and decreased concentration per Dr Ines at [facility].    Posterior vitreous detachment of right eye 2014   Premature menopause 12/20/2021   Primary osteoarthritis of left knee 06/20/2018   Psychophysiological insomnia 11/07/2022   Pure hypercholesterolemia 05/15/2013   Rectal abscess 05/10/2021   S/P left THA, AA 07/25/2016   S/P revision of total hip 04/10/2018   S/P tendon repair 01/23/2024   Sacroiliac inflammation (HCC) 08/18/2021   Severe obstructive sleep apnea-hypopnea syndrome 12/22/2022   Shingles    Shortness of breath dyspnea    with exertion   Sicca syndrome  (HCC) 11/18/2018   (+) ANA   Sjogren syndrome (HCC) 01/27/2019   Sjogren's syndrome (HCC) 01/27/2019   Sleep walking and eating 03/06/2017   Snoring 03/06/2017   Spondylosis of lumbar region without myelopathy or radiculopathy 10/30/2011   Status post total hip replacement, left 09/20/2017   TFC (triangular fibrocartilage complex) injury 02/25/2019   Thyroid  nodule 08/11/2009   Findings: The thyroid  gland is within normal limits in size.  The gland is diffusely inhomogeneous. A small solid nodule is noted in the lower pole  medially on the right of 7 x 6 x 8 mm. A small solid nodule is noted inferiorly on the left of 3 x 3 x 4 mm.  IMPRESSION:  The thyroid  gland is within normal limits in size with only small solid nodules present, the largest of only 8 mm in diameter on the right.     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    Trochanteric bursitis, right hip 04/26/2020   Tubular adenoma of colon 01/28/2021   Colonoscopy screening 4 mm tubular Adenoma polyp Marilu Sol, MD [facility])   Tubular adenoma of colon 01/28/2021   Colonoscopy screening 4 mm tubular Adenoma polyp Marilu Sol, MD [facility])   Vasomotor symptoms due to menopause 04/19/2017   Vertigo 03/19/2023   Vitamin D  deficiency 05/08/2017   Vulvitis 07/22/2020   Yeast vaginitis 07/11/2019   Past Surgical History:  Procedure Laterality Date   arthroscopy Left 01/2022   with SAD and DCR, K. Supple MD   Bladder dilitation     x 3   BREAST BIOPSY Right 2011   Benign histology   CARDIOVASCULAR STRESS TEST  2000   Unremarkable per pt report   CARPOMETACARPAL JOINT ARTHROTOMY Right 2011   COLONOSCOPY     COLONOSCOPY WITH PROPOFOL  N/A 04/21/2015   Procedure: COLONOSCOPY WITH PROPOFOL ;  Surgeon: Belvie Just, MD;  Location[facility] ENDOSCOPY;  Service: Endoscopy;  Laterality: N/A;   CYSTOSCOPY W/ DILATION OF BLADDER N/A    EPIDURAL BLOCK INJECTION Left  04/12/2016   Left Medial Nerve Block and Left L5 ramus block, Dr Charlie Dolores    EPIDURAL BLOCK INJECTION  03/21/2016   Left L3-4 medial branch block and Left L5 & dorsal ramus block    EPIDURAL BLOCK INJECTION N/A 10/25/2016   Charlie Dolores, MD. Lumbar medial branch block   EPIDURAL BLOCK INJECTION N/A 02/09/2017   Charlie Dolores, MD.  Bilateral L3/4 medial branch block, bilateral L5 dorsal ramus block   EPIDURAL BLOCK INJECTION N/A 07/04/2017   Charlie Dolores, MD   EXCISION MORTON'S NEUROMA Right 05/03/2023   Procedure: EXCISION MORTON'S NEUROMA;  Surgeon: Kit Rush, MD;  Location: [facility];  Service: Orthopedics;  Laterality: Right;  general, local by surgeon 60 MIN   FECAL TRANSPLANT  04/21/2015   Procedure: FECAL TRANSPLANT;  Surgeon: Belvie Just, MD;  Location: [facility] ENDOSCOPY;  Service: Endoscopy;;   HIP ADDUCTOR TENOTOMY  01/2024   HIP ARTHROPLASTY Left    HIP ARTHROSCOPY Left 03/06/2018   Left hip arthroplasty, redo for loose hip arthroplasty. Procedure at [facility] hospital   INJECTION HIP INTRA ARTICULAR Left 11/2015   for OA by Dr Charlie Bonner REDBIRD IMPLANT PLACEMENT  04/2021   Norleen Sharps MD ([facility])   INTERSTIM IMPLANT REVISION N/A 09/2021   Norleen Sharps MD ([facility])   LIGAMENT REPAIR Right 05/03/2023   Procedure: COLLATERAL LIGAMENT REPAIR;  Surgeon: Kit Norleen, MD;  Location: [facility];  Service: Orthopedics;  Laterality: Right;  general, local by surgeon 60 MIN   OTHER SURGICAL HISTORY Left 2016   Left L3/L4 medial nerve block and Left L5 Dorsal Ramus block Dr FABIENE Bonner   TOTAL HIP ARTHROPLASTY Left 07/25/2016   Procedure: LEFT TOTAL HIP ARTHROPLASTY ANTERIOR APPROACH;  Surgeon: Donnice Car, MD;  Location: [facility];  Service: Orthopedics;  Laterality: Left;   WEIL OSTEOTOMY Right 05/03/2023   Procedure: RIGHT 2ND WEIL OSTEOTOMY;  Surgeon: Kit Norleen, MD;  Location: [facility];   Service: Orthopedics;  Laterality: Right;  general, local by surgeon 60 MIN   Patient Active Problem List   Diagnosis Date Noted   Chronic pain of left upper extremity 02/19/2024   Arthritis of carpometacarpal [facility]) joint of left thumb 09/07/2023   BMI 26.0-26.9,adult 02/07/2023   Low serum vitamin B12 01/10/2023   OSA (obstructive sleep apnea) 12/27/2022   Severe obstructive sleep apnea-hypopnea syndrome 12/22/2022   Hyperglycemia 12/11/2022   Generalized obesity 12/11/2022   SOBOE (shortness of breath on exertion) 12/11/2022   Other fatigue 12/11/2022   Sleep related headaches 11/07/2022   Intractable episodic cluster headache 11/07/2022   Psychophysiological insomnia 11/07/2022   Hallux rigidus of right foot 09/25/2022   Chronic pain of right knee 05/08/2022   Arthrosis of hand 12/20/2021   History of Clostridioides difficile colitis, required fecal transplantation 12/20/2021   Meibomian gland dysfunction (MGD) of both eyes 10/17/2021   Nuclear sclerosis of both eyes 10/17/2021   Epiretinal membrane (ERM) of left eye 10/17/2021   OAB (overactive bladder) 03/02/2021   Osteoarthritis of acromioclavicular joint 02/22/2021   Hyperhidrosis, scalp, 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   Chronic low back pain 05/14/2019   Sjogren syndrome (HCC) 01/27/2019   DDD (degenerative disc disease), cervical 12/27/2018   Cervical spondylosis 11/18/2018   DDD (degenerative disc disease), lumbar 11/18/2018   Keratoconjunctivitis sicca 11/18/2018   Chronic contact dermatitis 08/06/2018   Primary osteoarthritis of left knee 06/20/2018   Insulin  resistance 07/04/2017   Irritable bowel syndrome with diarrhea 04/03/2016   Left ventricular hypertrophy, mild 02/25/2016   Allergic rhinoconjunctivitis 07/02/2015   History of colonic polyps 07/01/2015   GERD (gastroesophageal reflux disease) 12/16/2014   Fibromyalgia 10/01/2013   Mood disorder (HCC) 10/01/2013   Hyperlipidemia 10/01/2013   Pure hypercholesterolemia  05/15/2013   Chronic migraine without aura 05/15/2013   Benign essential hypertension 10/28/2012   Hypothyroidism 11/09/2011   Chronic interstitial cystitis 11/09/2011    PCP: McDiarmid, Krystal, MD REFERRING PROVIDER: Daniel Norleen, PA-C REFERRING DIAG: s/p tendon repair Z 98.890  THERAPY DIAG:  Muscle weakness (generalized)  Other abnormalities of gait and mobility  Pain in right hip  Rationale for Evaluation and Treatment: Rehabilitation  ONSET DATE: 01/23/24   SUBJECTIVE:  SUBJECTIVE STATEMENT: Patient reports she fell on Saturday. She slid on a dog bone. She fell onto her Rt side. She is sore along the Rt low back and hip and is bruised on the Rt knee.   PERTINENT HISTORY: HTN, hypercholesterolemia, fibromyalgia, migraines, arthritis, concussion PAIN:  Are you having pain? Yes: NPRS scale: 4/10 in proximal R hip (newer onset) Pain location: Rt QL,low back, Rt hip  Pain description: sore Aggravating factors: sleeping Relieving factors: none--mild discomfort  PRECAUTIONS: Other: The patient will be TDWB x 6 weeks, Abductor hip precautions x 6 weeks to the right side.  WEIGHT BEARING RESTRICTIONS: Yes initially TDWB x 6 weeks (she is beyond that per surgical date)  FALLS:  Has patient fallen in last 6 months? No  LIVING ENVIRONMENT: Lives with: lives with their spouse Lives in: House/apartment Stairs: Yes: Internal: 12-14 steps; on right going up Has following equipment at home: Single point cane  PATIENT GOALS: No pain, no cane  NEXT MD VISIT: early September  OBJECTIVE:  Note: Objective measures were completed at Evaluation unless otherwise noted.  PATIENT SURVEYS:  LEFS : 47.5%  COGNITION: Overall cognitive status: Within functional limits for tasks assessed     SENSATION: WFL  POSTURE: No Significant postural limitations  PALPATION: Mild soreness in lateral hip  LOWER EXTREMITY ROM: AROM to 90 degrees hip flexion Passive ROM Right eval Left eval  Right 03/27/24 Right 04/07/24  Hip flexion 95  In quadriped 120 deg Can sit into child's pose  Hip extension      Hip abduction 35     Hip adduction      Hip internal rotation 30     Hip external rotation 30     Knee flexion      Knee extension      Ankle dorsiflexion      Ankle plantarflexion      Ankle inversion      Ankle eversion       (Blank rows = not tested)  LOWER EXTREMITY MMT: did not test due to post surgical status-- will assess as we initiate strengthening progression in PT MMT Right eval Left eval  Hip flexion 4/5 5/5  Hip extension 3+/5 4+/5  Hip abduction 4/5 *unable to tolerate laying on R side *detect weakness with h/o THR 2018  Hip adduction    Hip internal rotation 4/5 5/5  Hip external rotation 3+/5 3/5  Knee flexion 3+/5 4+/5  Knee extension 5/5 5/5  Ankle dorsiflexion 5/5 5/5  Ankle plantarflexion    Ankle inversion    Ankle eversion     (Blank rows = not tested)  BALANCE: R single limb x 2 seconds  L single limb x 10 seconds  GAIT: Distance walked: 150 ft Assistive device utilized: Single point cane and None Level of assistance: Modified independence Comments: 3.2 ft/sec-- patient does start off without a limp and after 100 ft has a R antalgic gait pattern *PT and patient discussed still needing the cane when outdoors and in the evening when fatigued [facility] Adult PT Treatment:                                                DATE: 05/05/24 Therapeutic Exercise: LTR with figure 4 x 1 min each  Updated HEP  Neuromuscular re-ed: Hooklying hip abduction blue band 2 x 10  Supine TA march with resistance blue band 2 x 10  Standing hip abduction red band 2 x 10  Standing hip extension red band 2 x 10  SLS fingertip support multiple reps RLE Therapeutic Activity: Leg press with blue band at thighs  x 15 @ 45 lbs, x 15 @ 65 lbs  Treadmill x 5 minutes speed 2.0, incline 3   [facility] Adult PT Treatment:                                                 DATE: 05/01/24 Therapeutic Exercise: Prone Hip extension x 10 reps Seated  Leg press 45# x 12 reps Leg press 65# x 10 reps Supine Thomas test stretch Manual Therapy: Self massage using a massage ball on anterior hip PROM hip IR/ER and quad stretch IASTM R lateral quad, IT Band, lateral hip, glut med Therapeutic Activity: Step ups x 10 reps R and L to 4 steps, increased to 6 step and added contralateral LE marching while dec'ing UE support Resisted walking 5# posterior and anterior x 6 reps each Gait: Treadmill x 5 minutes at 2.0 mph and 2% incline   [facility] Adult PT Treatment:                                                DATE: 04/28/24 Therapeutic Exercise: MMT-see chart Seated  Hip ER R and L Supine SLR R and L x 12 reps Sidelying SLR R x 10 reps (doesn't tolerate laying on R side to do L abduction) Prone SLR R and L x 12 reps Manual Therapy: Educated on scar tissue massage and provided handout Therapeutic Activity: Standing side stepping with red band x 10 feet x 2 reps Standing hip activiation in SLS performing hip abduction with red band and hip extension with red band x 10 reps each Standing knee flexion with red theraband x 10 reps R and L QL stretch in door frame Gait: Treadmill x 5 minutes at 1.9 mph and 2% incline   PATIENT EDUCATION:  Education details: HEP update Person educated: Patient Education method: Programmer, multimedia, demo, cues, handout Education comprehension: verbalized understanding, returned demo, cues   HOME EXERCISE PROGRAM: Access Code: [facility] URL: [email or web address] Date: 05/05/2024 Prepared by: Lucie Meeter  Program Notes n/a  Exercises - Side Stepping with Resistance at Ankles  - 1 x daily - 3-4 x weekly - 1 sets - 10 reps - Standing Hip Abduction with Resistance at Ankles and Counter Support  - 1 x daily - 3-4 x weekly - 2 sets - 10 reps - Standing Hip Extension with Resistance at Ankles and Counter Support  - 1 x  daily - 3-4 x weekly - 2 sets - 10 reps - Standing Hamstring Curl with Resistance  - 1 x daily - 3-4 x weekly - 2 sets - 10 reps - Standing Quadratus Lumborum Stretch with Doorway  - 1 x daily - 3-4 x weekly - 1 sets - 3 reps - 20 seconds hold - Supine Active Straight Leg Raise  - 1 x daily - 3-4 x weekly - 1 sets - 10 reps - Sidelying Hip Abduction  - 1 x  daily - 3-4 x weekly - 1 sets - 10 reps - Prone Hip Extension  - 1 x daily - 3-4 x weekly - 2 sets - 10 reps - Seated Hip External Rotation AROM  - 1 x daily - 3-4 x weekly - 1 sets - 10 reps - Single Leg Stance with Support  - 1 x daily - 7 x weekly - 3 sets - max  hold  Patient Education - Scar Massage  ASSESSMENT: CLINICAL IMPRESSION: Patient arrives with soreness in the low back and hip after sustaining a fall over the weekend where she landed on the Rt side. She tolerated all strengthening and mobility well without an increase in pain reported. Challenged with maintaining lumbopelvic stability during standing activity with RLE as stance leg.   EVAL: Patient is a 68 y.o. female who was seen today for physical therapy evaluation and treatment s/p R glut med repair. She is 6.5 weeks post op today and cleared for WBAT and to begin strengthening. PT initiated HEP today for gentle ROM, glut activations, and to also address R QL pain. Patient feels that cane use is flaring other pain including L 1st MCP joint, L shoulder, and R QL. PT discussed the rationale for cane use discussing goal to normalize gait and avoid an antalgic pattern, therefore, patient to use when in the community and in the evening if she is fatigued. PT to address deficits and promote return to prior functional status.   OBJECTIVE IMPAIRMENTS: Abnormal gait, decreased activity tolerance, decreased balance, decreased mobility, difficulty walking, decreased ROM, decreased strength, increased fascial restrictions, impaired flexibility, and pain.   GOALS: Goals reviewed with  patient? Yes  SHORT TERM GOALS: Target date: 04/16/24  The patient will be indep with initial HEP Baseline: initiated at eval Goal status:MET  2.  The patient will improve LEFS by 8% to demonstrate improved functional abilities. Baseline:  47.5% Goal status: MET= at 62.5% on 8/4  3.   The patient will  demonstrate normal gait pattern with no evidence of antalgic pattern x 500 ft to d/c cane for community distances. Baseline:  Gets antalgic pattern after 100 ft in clinic Goal status: PARTIALLY MET  4.  The patient will maintain single leg standing R side x 8 seconds to demo good hip stability for functional loading. Baseline:  2 seconds, L is 10 seconds. Goal status: MET on 03/27/24 10 seconds each side  LONG TERM GOALS: Target date: 05/17/24  The patient will be indep with progression of HEP. Baseline:  initiated at eval Goal status: INITIAL  2.  The patient will improve LEFS by 15% to demonstrate improved functional abilities. Baseline: 47.5% Goal status: INITIAL  3.  The patient will report no pain in R quadratus lumborum region demonstrating reduced compensatory strategies with gait. Baseline:  Is using QL for hip hike and has pain at eval Goal status: INITIAL  4.  The patient will be able to negotiate 12 steps with one rail and reciprocal pattern mod indep. Baseline:  has done steps 1x since d/c home Goal status: MET  5.  The patient will demo improved R LE strength demonstrating SLR x 10 reps. Baseline:   to assess MMT Goal status: MET  6. The patient will have negative positional testing indicating resolution of BPPV.  Baseline: + R BPPV  Goal status: MET   PLAN:  PT FREQUENCY: 2x/week  PT DURATION: 8 weeks  PLANNED INTERVENTIONS: 97164- PT Re-evaluation, 97750- Physical Performance Testing, 97110-Therapeutic exercises, 97530- Therapeutic  activity, [id number]- Neuromuscular re-education, [phone]- Self Care, [id number]- Manual therapy, [phone]- Gait training, [phone]- Aquatic  Therapy, [phone]- Electrical stimulation (unattended), [phone] (1-2 muscles), [id number] (3+ muscles)- Dry Needling, Patient/Family education, Balance training, Stair training, Taping, Joint mobilization, Cryotherapy, and Moist heat  PLAN FOR NEXT SESSION: Progress HEP for R hip strengthening, normalize gait pattern without device, work on QL lengthening due to pain, work on stairs and R LE stance control.  treadmill working on incline for negotiating driveway  Plan to d/c at end of month if continued progress (or drop to 1x/week-- will assess).  PT scan/emailed information to [facility] for referral. RE-CERT   Lucie Meeter, PT, DPT, ATC 05/05/24 2:00 PM

## 2024-05-06 ENCOUNTER — Other Ambulatory Visit: Payer: Self-pay | Admitting: Family Medicine

## 2024-05-06 DIAGNOSIS — Z1231 Encounter for screening mammogram for malignant neoplasm of breast: Secondary | ICD-10-CM

## 2024-05-08 ENCOUNTER — Encounter: Admitting: Rehabilitative and Restorative Service Providers"

## 2024-05-13 ENCOUNTER — Other Ambulatory Visit: Payer: Self-pay | Admitting: Plastic Surgery

## 2024-05-16 ENCOUNTER — Ambulatory Visit: Admitting: Rehabilitative and Restorative Service Providers"

## 2024-05-16 ENCOUNTER — Other Ambulatory Visit: Payer: Self-pay | Admitting: Family Medicine

## 2024-05-17 ENCOUNTER — Other Ambulatory Visit: Payer: Self-pay | Admitting: Family Medicine

## 2024-05-20 NOTE — Therapy (Unsigned)
 OUTPATIENT PHYSICAL THERAPY LOWER EXTREMITY TREATMENT  Patient Name: Molly Giles MRN: [id number] DOB:10-11-1955, 68 y.o., female Today's Date: 05/20/2024  END OF SESSION:     Past Medical History:  Diagnosis Date   Abdominal bloating 05/10/2021   Abdominal discomfort, generalized 05/07/2016   Acquired hallux rigidus of right foot 09/15/2022   Acute gastritis 11/19/2020   Allergic rhinoconjunctivitis 07/02/2015   Anal fissure 11/19/2020   Anterior to posterior tear of superior glenoid labrum of left shoulder 02/22/2021   Arthritis    Arthritis of carpometacarpal [facility]) joint of left thumb 09/07/2023   Aseptic loosening of prosthetic hip, initial encounter (HCC) 11/29/2017   Benign essential hypertension 10/28/2012   Benign positional vertigo 04/2015   Responded well to [facility]   Bruxism (teeth grinding)    Cervical spondylosis 11/18/2018   Chronic contact dermatitis 08/06/2018   Per allergist Dr Almarie Scala.    Chronic interstitial cystitis 11/09/2011   Managed by [facility] urology branch in [place] hydrodistention 5 years ago with Dr Claudene in [place], has relief of symptoms until now     Chronic low back pain 05/14/2019   Chronic migraine 02/25/2016   Per Dr Ines review in notes from [facility] from September 2015. Showed total headache days last month 18. Severe headache days 7 days. Moderate headache days 5 days. Mild headache days last month sick days. Days without headache last month 10 days. Symptoms associated with photophobia, phonophobia, osmophobia, neck pain, dizziness, jaw pain, nasal congestion, vision disturbances, tingling and numbness, weakness and worsening with activity. Each headache attack last 3 hours depending on treatment in severity. Left side, the right side, easier side, the frontal area in the back of the head. Characterized as throbbing, pressure, tightness, squeezing, stabbing and burning    Chronic migraine without aura  05/15/2013    Dr Ines [facility]   Chronic tonsillitis 01/27/2019   DDD (degenerative disc disease), cervical 11/18/2018   DDD (degenerative disc disease), lumbar    Depression    Disc displacement, lumbar    Dysphagia 10/11/2021   Dysrhythmia    seen by dr Blanca- not a problem since she has been on Bystolic    Epiretinal membrane (ERM) of left eye 10/17/2021   Periodic ophthalmological monitoring     Episodic cluster headache, not intractable 03/06/2017   Essential hypertension 05/15/2013   Family history of adverse reaction to anesthesia    Brother- N/V   Family history of premature CAD 05/15/2013   Fibromyalgia 10/01/2013   Management by Dr GORMAN. Devashwar ([facility])     Fibromyalgia syndrome 07/01/2015   Management by Dr GORMAN. Devashwar ([facility])    GERD (gastroesophageal reflux disease) 12/16/2014   H/O seasonal allergies    Hallux rigidus of right foot 09/25/2022   Hammer toe    Left great toe   Hashimoto's thyroiditis    Per patient, diagnosed by Dr. Sharyne Pacini   Hearing loss of both ears 07/27/2015   mild to borderline moderate low frequency hearing loss improving to within normal limits bilaterally on audiology testing at [facility] in November 2016.     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   Hyperlipidemia 1998   Hypokalemia due to excessive gastrointestinal loss of potassium 07/28/2019   Hypothyroidism    Iliopsoas bursitis of left hip 05/08/2022   Impairment of balance 02/2015   Consequent of postconcussive syndrome   Injury of triangular fibrocartilage  complex (TFCC) 02/25/2019   Injury of triangular fibrocartilage complex of left wrist 02/25/2019   Dx 02/25/19 Prentice Pagan IV MD ([facility])   Insulin  resistance 07/04/2017   Internal hemorrhoid 01/28/2021   Internal hemorrhoid seen on colonoscopy 10/2020 REGINOLD Sol MD [facility])   Interstitial cystitis    Irritable bowel syndrome with diarrhea 04/03/2016   Keratoconjunctivitis sicca 11/18/2018   (+) ANA     Left ventricular hypertrophy, mild 02/25/2016   ECHOcardiogram report 06/17/15 showing EF55-60%, mild LVH and G1DD    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HCC) 03/10/2018   [facility]   Low serum vitamin B12 01/10/2023   Lumbar facet joint pain    Meibomian gland dysfunction (MGD) of both eyes 10/17/2021   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  Added automatically from request for surgery [id number]     Formatting of this note might be different from the original.  Added automatically from request for surgery [id number]     Obesity (BMI 30.0-34.9) 01/29/2017   Odynophagia 12/20/2021   OSA (obstructive sleep apnea) 12/27/2022   Dr Dohmeier polysomnography titration study 04/27/24  There was no clinically significant apnea noted under 5 cm water  CPAP pressure with an AHI of 0.7/h.  What was present was hypoxia- not severe, and loud snoring.   CMS AHI was 0/h under 6 cm water  CPAP which reduced the snoring somewhat and reduced hypoxia. Higher CPAP pressures induced more apneas and hypopneas.  The weight loss has reduced the   Osteoarthritis of acromioclavicular joint 02/22/2021   Osteoarthritis of  left hip 11/01/2015   MRI order by Dr Ernie (ortho) 10/2015 showed significant arthritis of left hip joint with cystic changes in femoral head c/w osteoarthritis   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, lumbar region 10/30/2011   Other insomnia 11/09/2016   Overweight    Pain in joint of left shoulder 11/09/2016   Pain in joint, multiple sites 11/18/2018   Palpitations 05/15/2013   [facility] manages   Perianal candidiasis 12/20/2021   Periodic limb movement sleep disorder 03/28/2017   Perirectal cyst 05/07/2016   Plantar fasciitis of right foot 11/10/2022   Poison ivy dermatitis 03/24/2021   PONV (postoperative nausea and vomiting)    Positive ANA (antinuclear antibody) 11/18/2018   Post concussion syndrome 06/06/2015   Post concussive syndrome 07/14/2015   Ms Pooler's post-concussive syndrome manifesting in vertigo and headache, mood changes, poor balance, dizziness, and decreased concentration per Dr Ines at [facility].    Posterior vitreous detachment of right eye 2014   Premature menopause 12/20/2021   Primary osteoarthritis of left knee 06/20/2018   Psychophysiological insomnia 11/07/2022   Pure hypercholesterolemia 05/15/2013   Rectal abscess 05/10/2021   S/P left THA, AA 07/25/2016   S/P revision of total hip 04/10/2018   S/P tendon  repair 01/23/2024   Sacroiliac inflammation (HCC) 08/18/2021   Severe obstructive sleep apnea-hypopnea syndrome 12/22/2022   Shingles    Shortness of breath dyspnea    with exertion   Sicca syndrome (HCC) 11/18/2018   (+) ANA   Sjogren syndrome (HCC) 01/27/2019   Sjogren's syndrome (HCC) 01/27/2019   Sleep walking and eating 03/06/2017   Snoring 03/06/2017   Spondylosis of lumbar region without myelopathy or radiculopathy 10/30/2011   Status post total hip replacement, left 09/20/2017   TFC (triangular fibrocartilage complex) injury 02/25/2019   Thyroid  nodule 08/11/2009   Findings: The thyroid  gland  is within normal limits in size.  The gland is diffusely inhomogeneous. A small solid nodule is noted in the lower pole  medially on the right of 7 x 6 x 8 mm. A small solid nodule is noted inferiorly on the left of 3 x 3 x 4 mm.  IMPRESSION:  The thyroid  gland is within normal limits in size with only small solid nodules present, the largest of only 8 mm in diameter on the right.     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    Trochanteric bursitis, right hip 04/26/2020   Tubular adenoma of colon 01/28/2021   Colonoscopy screening 4 mm tubular Adenoma polyp Marilu Sol, MD [facility])   Tubular adenoma of colon 01/28/2021   Colonoscopy screening 4 mm tubular Adenoma polyp Marilu Sol, MD [facility])   Vasomotor symptoms due to menopause 04/19/2017   Vertigo 03/19/2023   Vitamin D  deficiency 05/08/2017   Vulvitis 07/22/2020   Yeast vaginitis 07/11/2019   Past Surgical History:  Procedure Laterality Date   arthroscopy Left 01/2022   with SAD and DCR, K. Supple MD   Bladder dilitation     x 3   BREAST BIOPSY Right 2011   Benign histology   CARDIOVASCULAR STRESS TEST  2000   Unremarkable per pt report   CARPOMETACARPAL JOINT ARTHROTOMY Right 2011   COLONOSCOPY     COLONOSCOPY WITH PROPOFOL  N/A 04/21/2015   Procedure: COLONOSCOPY WITH PROPOFOL ;  Surgeon: Belvie Just, MD;  Location: [facility] ENDOSCOPY;  Service: Endoscopy;  Laterality: N/A;   CYSTOSCOPY W/ DILATION OF BLADDER N/A    EPIDURAL BLOCK INJECTION Left 04/12/2016   Left Medial Nerve Block and Left L5 ramus block, Dr Charlie Dolores    EPIDURAL BLOCK INJECTION  03/21/2016   Left L3-4 medial branch block and Left L5 & dorsal ramus block    EPIDURAL BLOCK INJECTION N/A 10/25/2016   Charlie Dolores, MD. Lumbar medial branch block   EPIDURAL BLOCK INJECTION N/A 02/09/2017   Charlie Dolores, MD.  Bilateral L3/4 medial branch block, bilateral L5 dorsal ramus block   EPIDURAL  BLOCK INJECTION N/A 07/04/2017   Charlie Dolores, MD   EXCISION MORTON'S NEUROMA Right 05/03/2023   Procedure: EXCISION MORTON'S NEUROMA;  Surgeon: Kit Rush, MD;  Location: [facility];  Service: Orthopedics;  Laterality: Right;  general, local by surgeon 60 MIN   FECAL TRANSPLANT  04/21/2015   Procedure: FECAL TRANSPLANT;  Surgeon: Belvie Just, MD;  Location: [facility] ENDOSCOPY;  Service: Endoscopy;;   HIP ADDUCTOR TENOTOMY  01/2024   HIP ARTHROPLASTY Left    HIP ARTHROSCOPY Left 03/06/2018   Left hip arthroplasty, redo for loose hip arthroplasty. Procedure at [facility] hospital   INJECTION HIP INTRA ARTICULAR Left 11/2015   for OA by Dr Charlie Dolores REDBIRD IMPLANT PLACEMENT  04/2021  Norleen Sharps MD ([facility])   INTERSTIM IMPLANT REVISION N/A 09/2021   Norleen Sharps MD ([facility])   LIGAMENT REPAIR Right 05/03/2023   Procedure: COLLATERAL LIGAMENT REPAIR;  Surgeon: Kit Norleen, MD;  Location: [facility];  Service: Orthopedics;  Laterality: Right;  general, local by surgeon 60 MIN   OTHER SURGICAL HISTORY Left 2016   Left L3/L4 medial nerve block and Left L5 Dorsal Ramus block Dr FABIENE Dolores   TOTAL HIP ARTHROPLASTY Left 07/25/2016   Procedure: LEFT TOTAL HIP ARTHROPLASTY ANTERIOR APPROACH;  Surgeon: Donnice Car, MD;  Location: [facility];  Service: Orthopedics;  Laterality: Left;   WEIL OSTEOTOMY Right 05/03/2023   Procedure: RIGHT 2ND WEIL OSTEOTOMY;  Surgeon: Kit Norleen, MD;  Location: [facility];  Service: Orthopedics;  Laterality: Right;  general, local by surgeon 60 MIN   Patient Active Problem List   Diagnosis Date Noted   Chronic pain of left upper extremity 02/19/2024   Arthritis of carpometacarpal [facility]) joint of left thumb 09/07/2023   BMI 26.0-26.9,adult 02/07/2023   Low serum vitamin B12 01/10/2023   OSA (obstructive sleep apnea) 12/27/2022   Severe obstructive sleep apnea-hypopnea syndrome 12/22/2022    Hyperglycemia 12/11/2022   Generalized obesity 12/11/2022   SOBOE (shortness of breath on exertion) 12/11/2022   Other fatigue 12/11/2022   Sleep related headaches 11/07/2022   Intractable episodic cluster headache 11/07/2022   Psychophysiological insomnia 11/07/2022   Hallux rigidus of right foot 09/25/2022   Chronic pain of right knee 05/08/2022   Arthrosis of hand 12/20/2021   History of Clostridioides difficile colitis, required fecal transplantation 12/20/2021   Meibomian gland dysfunction (MGD) of both eyes 10/17/2021   Nuclear sclerosis of both eyes 10/17/2021   Epiretinal membrane (ERM) of left eye 10/17/2021   OAB (overactive bladder) 03/02/2021   Osteoarthritis of acromioclavicular joint 02/22/2021   Hyperhidrosis, scalp, 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   Chronic low back pain 05/14/2019   Sjogren syndrome (HCC) 01/27/2019   DDD (degenerative disc disease), cervical 12/27/2018   Cervical spondylosis 11/18/2018   DDD (degenerative disc disease), lumbar 11/18/2018   Keratoconjunctivitis sicca 11/18/2018   Chronic contact dermatitis 08/06/2018   Primary osteoarthritis of left knee 06/20/2018   Insulin  resistance 07/04/2017   Irritable bowel syndrome with diarrhea 04/03/2016   Left ventricular hypertrophy, mild 02/25/2016   Allergic rhinoconjunctivitis 07/02/2015   History of colonic polyps 07/01/2015   GERD (gastroesophageal reflux disease) 12/16/2014   Fibromyalgia 10/01/2013   Mood disorder (HCC) 10/01/2013   Hyperlipidemia 10/01/2013   Pure hypercholesterolemia 05/15/2013   Chronic migraine without aura 05/15/2013   Benign essential hypertension 10/28/2012   Hypothyroidism 11/09/2011   Chronic interstitial cystitis 11/09/2011    PCP: McDiarmid, Krystal, MD REFERRING PROVIDER: Daniel Norleen, PA-C REFERRING DIAG: s/p tendon repair Z 98.890  THERAPY DIAG:  No diagnosis found.  Rationale for Evaluation and Treatment: Rehabilitation  ONSET DATE: 01/23/24   SUBJECTIVE:   SUBJECTIVE STATEMENT: ***Patient reports she fell on Saturday. She slid on a dog bone. She fell onto her Rt side. She is sore along the Rt low back and hip and is bruised on the Rt knee.   PERTINENT HISTORY: HTN, hypercholesterolemia, fibromyalgia, migraines, arthritis, concussion PAIN:  Are you having pain? Yes: NPRS scale: 4/10 in proximal R hip (newer onset) Pain location: Rt QL,low back, Rt hip  Pain description: sore Aggravating factors: sleeping Relieving factors: none--mild discomfort  PRECAUTIONS: Other: The patient will be TDWB x 6 weeks, Abductor hip precautions  x 6 weeks to the right side.  WEIGHT BEARING RESTRICTIONS: Yes initially TDWB x 6 weeks (she is beyond that per surgical date)  FALLS:  Has patient fallen in last 6 months? No  LIVING ENVIRONMENT: Lives with: lives with their spouse Lives in: House/apartment Stairs: Yes: Internal: 12-14 steps; on right going up Has following equipment at home: Single point cane  PATIENT GOALS: No pain, no cane  NEXT MD VISIT: early September  OBJECTIVE:  Note: Objective measures were completed at Evaluation unless otherwise noted.  PATIENT SURVEYS:  LEFS : 47.5%  COGNITION: Overall cognitive status: Within functional limits for tasks assessed     SENSATION: WFL  POSTURE: No Significant postural limitations  PALPATION: Mild soreness in lateral hip  LOWER EXTREMITY ROM: AROM to 90 degrees hip flexion Passive ROM Right eval Left eval Right 03/27/24 Right 04/07/24  Hip flexion 95  In quadriped 120 deg Can sit into child's pose  Hip extension      Hip abduction 35     Hip adduction      Hip internal rotation 30     Hip external rotation 30     Knee flexion      Knee extension      Ankle dorsiflexion      Ankle plantarflexion      Ankle inversion      Ankle eversion       (Blank rows = not tested)  LOWER EXTREMITY MMT: did not test due to post surgical status-- will assess as we initiate strengthening  progression in PT MMT Right eval Left eval  Hip flexion 4/5 5/5  Hip extension 3+/5 4+/5  Hip abduction 4/5 *unable to tolerate laying on R side *detect weakness with h/o THR 2018  Hip adduction    Hip internal rotation 4/5 5/5  Hip external rotation 3+/5 3/5  Knee flexion 3+/5 4+/5  Knee extension 5/5 5/5  Ankle dorsiflexion 5/5 5/5  Ankle plantarflexion    Ankle inversion    Ankle eversion     (Blank rows = not tested)  BALANCE: R single limb x 2 seconds  L single limb x 10 seconds  GAIT: Distance walked: 150 ft Assistive device utilized: Single point cane and None Level of assistance: Modified independence Comments: 3.2 ft/sec-- patient does start off without a limp and after 100 ft has a R antalgic gait pattern *PT and patient discussed still needing the cane when outdoors and in the evening when fatigued  [facility] Adult PT Treatment:                                                DATE: 05/21/24 Therapeutic Exercise: *** Manual Therapy: *** Neuromuscular re-ed: *** Therapeutic Activity: *** Gait: *** Modalities: *** Self Care: [facility] [employer] Adult PT Treatment:                                                DATE: 05/05/24 Therapeutic Exercise: LTR with figure 4 x 1 min each  Updated HEP   Neuromuscular re-ed: Hooklying hip abduction blue band 2 x 10  Supine TA march with resistance blue band 2 x 10  Standing hip abduction red band 2 x 10  Standing hip  extension red band 2 x 10  SLS fingertip support multiple reps RLE Therapeutic Activity: Leg press with blue band at thighs  x 15 @ 45 lbs, x 15 @ 65 lbs  Treadmill x 5 minutes speed 2.0, incline 3   [facility] Adult PT Treatment:                                                DATE: 05/01/24 Therapeutic Exercise: Prone Hip extension x 10 reps Seated  Leg press 45# x 12 reps Leg press 65# x 10 reps Supine Thomas test stretch Manual Therapy: Self massage using a massage ball on anterior hip PROM hip IR/ER and  quad stretch IASTM R lateral quad, IT Band, lateral hip, glut med Therapeutic Activity: Step ups x 10 reps R and L to 4 steps, increased to 6 step and added contralateral LE marching while dec'ing UE support Resisted walking 5# posterior and anterior x 6 reps each Gait: Treadmill x 5 minutes at 2.0 mph and 2% incline   [facility] Adult PT Treatment:                                                DATE: 04/28/24 Therapeutic Exercise: MMT-see chart Seated  Hip ER R and L Supine SLR R and L x 12 reps Sidelying SLR R x 10 reps (doesn't tolerate laying on R side to do L abduction) Prone SLR R and L x 12 reps Manual Therapy: Educated on scar tissue massage and provided handout Therapeutic Activity: Standing side stepping with red band x 10 feet x 2 reps Standing hip activiation in SLS performing hip abduction with red band and hip extension with red band x 10 reps each Standing knee flexion with red theraband x 10 reps R and L QL stretch in door frame Gait: Treadmill x 5 minutes at 1.9 mph and 2% incline   PATIENT EDUCATION:  Education details: HEP update Person educated: Patient Education method: Programmer, multimedia, demo, cues, handout Education comprehension: verbalized understanding, returned demo, cues   HOME EXERCISE PROGRAM: Access Code: [facility] URL: [email or web address] Date: 05/05/2024 Prepared by: Lucie Meeter  Program Notes n/a  Exercises - Side Stepping with Resistance at Ankles  - 1 x daily - 3-4 x weekly - 1 sets - 10 reps - Standing Hip Abduction with Resistance at Ankles and Counter Support  - 1 x daily - 3-4 x weekly - 2 sets - 10 reps - Standing Hip Extension with Resistance at Ankles and Counter Support  - 1 x daily - 3-4 x weekly - 2 sets - 10 reps - Standing Hamstring Curl with Resistance  - 1 x daily - 3-4 x weekly - 2 sets - 10 reps - Standing Quadratus Lumborum Stretch with Doorway  - 1 x daily - 3-4 x weekly - 1 sets - 3 reps - 20 seconds  hold - Supine Active Straight Leg Raise  - 1 x daily - 3-4 x weekly - 1 sets - 10 reps - Sidelying Hip Abduction  - 1 x daily - 3-4 x weekly - 1 sets - 10 reps - Prone Hip Extension  - 1 x daily - 3-4 x weekly - 2 sets - 10 reps - Seated Hip  External Rotation AROM  - 1 x daily - 3-4 x weekly - 1 sets - 10 reps - Single Leg Stance with Support  - 1 x daily - 7 x weekly - 3 sets - max  hold  Patient Education - Scar Massage  ASSESSMENT: CLINICAL IMPRESSION: Patient arrives with soreness in the low back and hip after sustaining a fall over the weekend where she landed on the Rt side. She tolerated all strengthening and mobility well without an increase in pain reported. Challenged with maintaining lumbopelvic stability during standing activity with RLE as stance leg.   EVAL: Patient is a 68 y.o. female who was seen today for physical therapy evaluation and treatment s/p R glut med repair. She is 6.5 weeks post op today and cleared for WBAT and to begin strengthening. PT initiated HEP today for gentle ROM, glut activations, and to also address R QL pain. Patient feels that cane use is flaring other pain including L 1st MCP joint, L shoulder, and R QL. PT discussed the rationale for cane use discussing goal to normalize gait and avoid an antalgic pattern, therefore, patient to use when in the community and in the evening if she is fatigued. PT to address deficits and promote return to prior functional status.   OBJECTIVE IMPAIRMENTS: Abnormal gait, decreased activity tolerance, decreased balance, decreased mobility, difficulty walking, decreased ROM, decreased strength, increased fascial restrictions, impaired flexibility, and pain.   GOALS: Goals reviewed with patient? Yes  SHORT TERM GOALS: Target date: 04/16/24  The patient will be indep with initial HEP Baseline: initiated at eval Goal status:MET  2.  The patient will improve LEFS by 8% to demonstrate improved functional abilities. Baseline:   47.5% Goal status: MET= at 62.5% on 8/4  3.   The patient will  demonstrate normal gait pattern with no evidence of antalgic pattern x 500 ft to d/c cane for community distances. Baseline:  Gets antalgic pattern after 100 ft in clinic Goal status: PARTIALLY MET  4.  The patient will maintain single leg standing R side x 8 seconds to demo good hip stability for functional loading. Baseline:  2 seconds, L is 10 seconds. Goal status: MET on 03/27/24 10 seconds each side  LONG TERM GOALS: Target date: 05/17/24  The patient will be indep with progression of HEP. Baseline:  initiated at eval Goal status: INITIAL  2.  The patient will improve LEFS by 15% to demonstrate improved functional abilities. Baseline: 47.5% Goal status: INITIAL  3.  The patient will report no pain in R quadratus lumborum region demonstrating reduced compensatory strategies with gait. Baseline:  Is using QL for hip hike and has pain at eval Goal status: INITIAL  4.  The patient will be able to negotiate 12 steps with one rail and reciprocal pattern mod indep. Baseline:  has done steps 1x since d/c home Goal status: MET  5.  The patient will demo improved R LE strength demonstrating SLR x 10 reps. Baseline:   to assess MMT Goal status: MET  6. The patient will have negative positional testing indicating resolution of BPPV.  Baseline: + R BPPV  Goal status: MET   PLAN:  PT FREQUENCY: 2x/week  PT DURATION: 8 weeks  PLANNED INTERVENTIONS: 97164- PT Re-evaluation, 97750- Physical Performance Testing, 97110-Therapeutic exercises, 97530- Therapeutic activity, [id number]- Neuromuscular re-education, 97535- Self Care, [id number]- Manual therapy, [phone]- Gait training, [phone]- Aquatic Therapy, [phone]- Electrical stimulation (unattended), [phone] (1-2 muscles), [phone] (3+ muscles)- Dry Needling, Patient/Family education, Balance training, Stair  training, Taping, Joint mobilization, Cryotherapy, and Moist heat  PLAN FOR NEXT SESSION:  Progress HEP for R hip strengthening, normalize gait pattern without device, work on QL lengthening due to pain, work on stairs and R LE stance control.  treadmill working on incline for negotiating driveway  Plan to d/c at end of month if continued progress (or drop to 1x/week-- will assess).  PT scan/emailed information to [facility] for referral.  RE-CERT    Shantoria Ellwood, PT  05/20/24 9:11 PM

## 2024-05-21 ENCOUNTER — Ambulatory Visit (HOSPITAL_BASED_OUTPATIENT_CLINIC_OR_DEPARTMENT_OTHER): Admitting: Psychiatry

## 2024-05-21 ENCOUNTER — Ambulatory Visit: Attending: Physician Assistant | Admitting: Rehabilitative and Restorative Service Providers"

## 2024-05-21 DIAGNOSIS — R2689 Other abnormalities of gait and mobility: Secondary | ICD-10-CM | POA: Diagnosis present

## 2024-05-21 DIAGNOSIS — M6281 Muscle weakness (generalized): Secondary | ICD-10-CM | POA: Diagnosis present

## 2024-05-21 DIAGNOSIS — M25551 Pain in right hip: Secondary | ICD-10-CM | POA: Diagnosis present

## 2024-05-21 DIAGNOSIS — F4323 Adjustment disorder with mixed anxiety and depressed mood: Secondary | ICD-10-CM | POA: Diagnosis not present

## 2024-05-21 MED ORDER — ZOLPIDEM TARTRATE 5 MG PO TABS: 5.0000 mg | ORAL_TABLET | Freq: Every evening | ORAL | 5 refills | Status: DC | PRN

## 2024-05-21 NOTE — Progress Notes (Signed)
 Called to start PA on Lorazepam  0.5 mg. They will call back with their decision Psychiatric Initial Adult Assessment   Patient Identification: Molly Giles MRN:  [id number] Date of Evaluation:  05/21/2024 Referral Source:  Chief Complaint:   Visit Diagnosis: Adjustment disorder  History of Present Illness: Dr. Krystal Belts    Today the patient is seen in the office and is doing very well.  Her relationship with her daughter are also much much improved.  In fact the patient and her husband are thinking about moving to [place] to help out her daughter-in-law and her son.  They have 2 children ages 26 and 7 who are doing great.  The grandchildren and even spend time with the patient and her husband.  That is a spent a number of nights there.  The patient is sleeping well.  She has some issues with CPAP.  She has had a hard time getting her mask fixed.  The patient recently had a repeat sleep study and the results are equivocal.  She is meeting with the sleep doctor in the next few weeks.  She believes they are telling her that she does not necessarily need to be on CPAP.  The patient's foot is well-healed.  The patient is sleeping and eating very well.  She takes 5 mg of Ambien  and does great.  She is on Celexa  10 mg and she is not sure why she needs to be on it.  She believes it was given for interstitial cystitis which she still has but does not really believe that Celexa  is used for her.  She believes the urologist started it on her because they thought she was depressed.  My evaluation is I have little evidence of major depression.  Since a different doctor is prescribing her Celexa  they will leave it up to her to speak to them about taking her off of them.  If she comes off of it I recommended that they reduce it from 20-10 and then a month later discontinued.  But I will let the prescribing doctor make those decisions.  Patient no longer takes Ativan .  Patient has lost a lot of weight and feels  good.  The patient is doing great. Associated Signs/Symptoms: Depression Symptoms:  anxiety, (Hypo) Manic Symptoms:   Anxiety Symptoms:  Excessive Worry, Psychotic Symptoms:  PTSD Symptoms: NA  Past Psychiatric History:   Previous Psychotropic Medications: Yes   Substance Abuse History in the last 12 months:  No.  Consequences of Substance Abuse: NA  Past Medical History:  Past Medical History:  Diagnosis Date   Abdominal bloating 05/10/2021   Abdominal discomfort, generalized 05/07/2016   Acquired hallux rigidus of right foot 09/15/2022   Acute gastritis 11/19/2020   Allergic rhinoconjunctivitis 07/02/2015   Anal fissure 11/19/2020   Anterior to posterior tear of superior glenoid labrum of left shoulder 02/22/2021   Arthritis    Arthritis of carpometacarpal (CMC) joint of left thumb 09/07/2023   Aseptic loosening of prosthetic hip, initial encounter (HCC) 11/29/2017   Benign essential hypertension 10/28/2012   Benign positional vertigo 04/2015   Responded well to [facility]   Bruxism (teeth grinding)    Cervical spondylosis 11/18/2018   Chronic contact dermatitis 08/06/2018   Per allergist Dr Almarie Scala.    Chronic interstitial cystitis 11/09/2011   Managed by [facility] urology branch in [place] hydrodistention 5 years ago with Dr Claudene in [place], has relief of symptoms until now     Chronic low back pain  05/14/2019   Chronic migraine 02/25/2016   Per Dr Ines review in notes from [facility] from September 2015. Showed total headache days last month 18. Severe headache days 7 days. Moderate headache days 5 days. Mild headache days last month sick days. Days without headache last month 10 days. Symptoms associated with photophobia, phonophobia, osmophobia, neck pain, dizziness, jaw pain, nasal congestion, vision disturbances, tingling and numbness, weakness and worsening with activity. Each headache attack last 3 hours depending on treatment in  severity. Left side, the right side, easier side, the frontal area in the back of the head. Characterized as throbbing, pressure, tightness, squeezing, stabbing and burning    Chronic migraine without aura 05/15/2013    Dr Ines [facility]   Chronic tonsillitis 01/27/2019   DDD (degenerative disc disease), cervical 11/18/2018   DDD (degenerative disc disease), lumbar    Depression    Disc displacement, lumbar    Dysphagia 10/11/2021   Dysrhythmia    seen by dr Blanca- not a problem since she has been on Bystolic    Epiretinal membrane (ERM) of left eye 10/17/2021   Periodic ophthalmological monitoring     Episodic cluster headache, not intractable 03/06/2017   Essential hypertension 05/15/2013   Family history of adverse reaction to anesthesia    Brother- N/V   Family history of premature CAD 05/15/2013   Fibromyalgia 10/01/2013   Management by Dr GORMAN. Devashwar (Rheum)     Fibromyalgia syndrome 07/01/2015   Management by Dr GORMAN. Devashwar ([facility]he[facility])    GERD (gastroesophageal reflux disease) 12/16/2014   H/O seasonal allergies    Hallux rigidus of right foot 09/25/2022   Hammer toe    Left great toe   Hashimoto's thyroiditis    Per patient, diagnosed by Dr. Sharyne Pacini   Hearing loss of both ears 07/27/2015   mild to borderline moderate low frequency hearing loss improving to within normal limits bilaterally on audiology testing at [facility] in November 2016.     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   Hyperlipidemia 1998   Hypokalemia due to excessive gastrointestinal  loss of potassium 07/28/2019   Hypothyroidism    Iliopsoas bursitis of left hip 05/08/2022   Impairment of balance 02/2015   Consequent of postconcussive syndrome   Injury of triangular fibrocartilage complex (TFCC) 02/25/2019   Injury of triangular fibrocartilage complex of left wrist 02/25/2019   Dx 02/25/19 Prentice Pagan IV MD ([facility])   Insulin  resistance 07/04/2017   Internal hemorrhoid 01/28/2021   Internal hemorrhoid seen on colonoscopy 10/2020 REGINOLD Sol MD [facility])   Interstitial cystitis    Irritable bowel syndrome with diarrhea 04/03/2016   Keratoconjunctivitis sicca 11/18/2018   (+) ANA     Left ventricular hypertrophy, mild 02/25/2016   ECHOcardiogram report 06/17/15 showing EF55-60%, mild LVH and G1DD    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HCC) 03/10/2018   [facility]   Low serum vitamin B12 01/10/2023   Lumbar facet joint pain    Meibomian gland dysfunction (MGD) of both eyes 10/17/2021   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  Added automatically from request for surgery [id number]     Formatting of this note might be different from the original.  Added automatically from request for surgery [id number]     Obesity (BMI 30.0-34.9) 01/29/2017   Odynophagia 12/20/2021   OSA (obstructive sleep apnea) 12/27/2022   Dr Dohmeier polysomnography titration study 04/27/24  There was no clinically significant apnea noted under 5 cm water  CPAP pressure with an AHI of 0.7/h.  What was present was hypoxia- not severe, and loud snoring.   CMS AHI was 0/h under 6 cm water  CPAP which reduced the  snoring somewhat and reduced hypoxia. Higher CPAP pressures induced more apneas and hypopneas.  The weight loss has reduced the   Osteoarthritis of acromioclavicular joint 02/22/2021   Osteoarthritis of left hip 11/01/2015   MRI order by Dr Ernie (ortho) 10/2015 showed significant arthritis of left hip joint with cystic changes in femoral head c/w osteoarthritis   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, lumbar region 10/30/2011   Other insomnia 11/09/2016   Overweight    Pain in joint of left shoulder 11/09/2016   Pain in joint, multiple sites 11/18/2018   Palpitations 05/15/2013   [facility] manages   Perianal candidiasis 12/20/2021   Periodic limb movement sleep disorder 03/28/2017   Perirectal cyst 05/07/2016   Plantar fasciitis of right foot 11/10/2022   Poison ivy dermatitis 03/24/2021   PONV (postoperative nausea and vomiting)    Positive ANA (antinuclear antibody) 11/18/2018   Post concussion syndrome 06/06/2015   Post concussive syndrome 07/14/2015   Ms Charo's post-concussive syndrome manifesting in vertigo and headache, mood changes, poor balance, dizziness, and decreased concentration per Dr Ines at [facility].    Posterior vitreous detachment of right eye 2014   Premature menopause 12/20/2021   Primary osteoarthritis of left knee 06/20/2018   Psychophysiological insomnia 11/07/2022   Pure hypercholesterolemia 05/15/2013   Rectal abscess 05/10/2021   S/P left THA, AA 07/25/2016   S/P revision of total hip 04/10/2018   S/P tendon repair 01/23/2024   Sacroiliac inflammation (HCC) 08/18/2021   Severe obstructive sleep apnea-hypopnea syndrome 12/22/2022   Shingles    Shortness of breath dyspnea    with exertion   Sicca syndrome (HCC) 11/18/2018   (+) ANA   Sjogren syndrome (HCC) 01/27/2019   Sjogren's syndrome (HCC) 01/27/2019   Sleep walking and eating 03/06/2017   Snoring 03/06/2017   Spondylosis of lumbar region without myelopathy  or radiculopathy 10/30/2011   Status post total hip replacement, left 09/20/2017   TFC (triangular fibrocartilage complex) injury 02/25/2019   Thyroid  nodule 08/11/2009   Findings: The thyroid  gland is within normal limits in size.  The gland is diffusely inhomogeneous. A small solid nodule is noted in the lower pole  medially on the right of 7 x 6 x 8 mm. A small solid nodule is noted inferiorly on the left of 3 x 3 x 4 mm.  IMPRESSION:  The thyroid  gland is within normal limits in size with only small solid nodules present, the largest of only 8 mm in diameter on the right.     Trochanteric bursitis of left  hip    Osteoarthritis from left hip dysplasia; mild dysplasia Crowe 1.    Trochanteric bursitis, right hip 04/26/2020   Tubular adenoma of colon 01/28/2021   Colonoscopy screening 4 mm tubular Adenoma polyp Marilu Sol, MD [facility])   Tubular adenoma of colon 01/28/2021   Colonoscopy screening 4 mm tubular Adenoma polyp Marilu Sol, MD [facility])   Vasomotor symptoms due to menopause 04/19/2017   Vertigo 03/19/2023   Vitamin D  deficiency 05/08/2017   Vulvitis 07/22/2020   Yeast vaginitis 07/11/2019    Past Surgical History:  Procedure Laterality Date   arthroscopy Left 01/2022   with SAD and DCR, K. Supple MD   Bladder dilitation     x 3   BREAST BIOPSY Right 2011   Benign histology   CARDIOVASCULAR STRESS TEST  2000   Unremarkable per pt report   CARPOMETACARPAL JOINT ARTHROTOMY Right 2011   COLONOSCOPY     COLONOSCOPY WITH PROPOFOL  N/A 04/21/2015   Procedure: COLONOSCOPY WITH PROPOFOL ;  Surgeon: Belvie Just, MD;  Location: [facility] END[facility]COPY;  Service: Endoscopy;  Laterality: N/A;   CYSTOSCOPY W/ DILATION OF BLADDER N/A    EPIDURAL BLOCK INJECTION Left 04/12/2016   Left Medial Nerve Block and Left L5 ramus block, Dr Charlie Dolores    EPIDURAL BLOCK INJECTION  03/21/2016   Left L3-4 medial branch block and Left L5 & dorsal ramus block    EPIDURAL BLOCK INJECTION N/A  10/25/2016   Charlie Dolores, MD. Lumbar medial branch block   EPIDURAL BLOCK INJECTION N/A 02/09/2017   Charlie Dolores, MD.  Bilateral L3/4 medial branch block, bilateral L5 dorsal ramus block   EPIDURAL BLOCK INJECTION N/A 07/04/2017   Charlie Dolores, MD   EXCISION MORTON'S NEUROMA Right 05/03/2023   Procedure: EXCISION MORTON'S NEUROMA;  Surgeon: Kit Rush, MD;  Location: [facility];  Service: Orthopedics;  Laterality: Right;  general, local by surgeon 60 MIN   FECAL TRANSPLANT  04/21/2015   Procedure: FECAL TRANSPLANT;  Surgeon: Belvie Just, MD;  Location[facility];  Service: Endoscopy;;   HIP ADDUCTOR TENOTOMY  01/2024   HIP ARTHROPLASTY Left    HIP ARTHROSCOPY Left 03/06/2018   Left hip arthroplasty, redo for loose hip arthroplasty. Procedure at [facility] hospital   INJECTION HIP INTRA ARTICULAR Left 11/2015   for OA by Dr Charlie Dolores REDBIRD IMPLANT PLACEMENT  04/2021   Rush Sharps MD ([facility])   INTERSTIM IMPLANT REVISION N/A 09/2021   Rush Sharps MD ([facility])   LIGAMENT REPAIR Right 05/03/2023   Procedure: COLLATERAL LIGAMENT REPAIR;  Surgeon: Kit Rush, MD;  Location: [facility];  Service: Orthopedics;  Laterality: Right;  general, local by surgeon 60 MIN   OTHER SURGICAL HISTORY Left 2016   Left L3/L4 medial nerve block and Left L5 Dorsal Ramus block Dr FABIENE Dolores   TOTAL HIP ARTHROPLASTY Left 07/25/2016   Procedure: LEFT TOTAL HIP ARTHROPLASTY ANTERIOR APPROACH;  Surgeon: Donnice Car, MD;  Location: [facility];  Service: Orthopedics;  Laterality: Left;   WEIL OSTEOTOMY Right 05/03/2023   Procedure: RIGHT 2ND WEIL OSTEOTOMY;  Surgeon: Kit Rush, MD;  Location: [facility];  Service: Orthopedics;  Laterality: Right;  general, local by surgeon 60 MIN    Family Psychiatric History:   Family History:  Family History  Problem Relation Age of Onset   Alzheimer's disease Mother     Hyperlipidemia Mother    Hypertension Mother    Osteoporosis Mother  Parkinson's disease Mother    Migraines Sister    Allergies Sister    Hypertension Sister    Hyperlipidemia Brother    Cardiomyopathy Brother    Diabetes type II Brother    Kidney disease Brother    Asthma Brother    Heart disease Brother    Hypertension Brother    Hyperlipidemia Brother    Kidney disease Brother    Heart disease Brother    Heart disease Father    Hyperlipidemia Father    Hypertension Father    Aortic aneurysm Father    Early death Father 67   Diabetes type II Other    Breast cancer Maternal Aunt     Social History:   Social History   Socioeconomic History   Marital status: Married    Spouse name: Zell Ramson   Number of children: 1   Years of education: 16   Highest education level: Bachelor's degree (e.g., BA, AB, BS)  Occupational History   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   Drug use: No   Sexual activity: Yes    Partners: Male    Birth control/protection: None  Other Topics Concern   Not on file  Social History Narrative   Prior PCP With [facility] [place], [place].   Married, lives with [place], (b. 1954)   Son in [place]. (2) grandchildren.   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    Wears seatbelt usually   No religious beliefs affecting healthcare   No difficulty taking medications as directed.       Home has working smoke alarm   No home throw rugs   Does not have nonslip bathtub / shower     Has railings on all stairs   Home is free from Clutter.    Right-handed.      Best number to reach patient [phone] (M) as of 09/21/2021.    No regular exercise of 3 times a week for 30 minutes at a time      Nkechi Linehan has Scientist, water quality and a Sports administrator is husband, Khaliya Golinski [phone]      Caffeine: 2-3 cups coffee per day   Social Drivers of Corporate investment banker Strain: Low Risk  (01/10/2024)   Overall Financial Resource Strain (CARDIA)    Difficulty of Paying Living Expenses: Not hard at all  Food Insecurity: Low Risk  (01/23/2024)   Received from [facility]   Hunger Vital Sign    Within the past 12 months, you worried that your food would run out before you got money to buy more: Never true    Within the past 12 months, the food you bought just didn't last and you didn't have money to get more. : Never true  Transportation Needs: No Transportation Needs (01/23/2024)   Received from [facility]    In the past 12 months, has lack  of reliable transportation kept you from medical appointments, meetings, work or from getting things needed for daily living? : No  Physical Activity: Unknown (01/10/2024)   Exercise Vital Sign    Days of Exercise per Week: 0 days    Minutes of Exercise per Session: Not on file  Stress: No Stress Concern Present (01/10/2024)   [employer] of Occupational Health - Occupational Stress Questionnaire    Feeling of Stress : Only a little  Social Connections: Moderately Integrated (01/10/2024)   Social Connection and Isolation Panel    Frequency of Communication with Friends and Family: Once a week    Frequency of Social Gatherings with Friends and Family: Twice a week    Attends Religious Services: Never    Database administrator or Organizations: Yes    Attends Engineer, structural: More than 4 times per year    Marital Status: Married    Additional Social History:   Allergies:    Allergies  Allergen Reactions   Metformin  Hives and Rash   Sulfa Antibiotics Itching   Chlorhexidine Gluconate Rash and Other (See Comments)    Burning only when used in private area, otherwise OK to use   Codeine Nausea Only    head nausea    Ketoprofen Other (See Comments)   Levofloxacin Other (See Comments)    Pain in arm and behind ankles. Pain in arm and behind ankles.    Lyrica [Pregabalin] Other (See Comments)    Dizziness, nausea   Methylprednisolone      Flushing of the face   Niacin Other (See Comments)    Flushing and tingling.    Prednisone      Flushing of the face   Betadine [Povidone Iodine] Rash    burning   Latex Itching and Rash    burning   Wellbutrin [Bupropion] Anxiety    Metabolic Disorder Labs: Lab Results  Component Value Date   HGBA1C 5.2 09/10/2023   No results found for: PROLACTIN Lab Results  Component Value Date   CHOL 166 09/10/2023   TRIG 95 09/10/2023   HDL 51 09/10/2023   CHOLHDL 3.3 10/11/2021   VLDL 26 12/14/2014   LDLCALC 97 09/10/2023   LDLCALC 101 (H) 12/11/2022   Lab Results  Component Value Date   TSH 2.290 10/18/2023    Therapeutic Level Labs: No results found for: LITHIUM No results found for: CBMZ No results found for: VALPROATE  Current Medications: Current Outpatient Medications  Medication Sig Dispense Refill   ALPRAZolam  (XANAX ) 0.5 MG tablet Take 1 tablet (0.5 mg total) by mouth at bedtime as needed (for use in the sleep lab- please bring to the sleep study appointment.). 3 tablet 0   Ascorbic Acid (VITAMIN C PO) Take by mouth.     citalopram  (CELEXA ) 20 MG tablet TAKE 1 TABLET(20 MG) BY MOUTH DAILY 90 tablet 3   levothyroxine  (SYNTHROID ) 125 MCG tablet Take 0.5 tablets (62.5 mcg total) by mouth daily before breakfast. 45 tablet 3   LORazepam  (ATIVAN ) 0.5 MG tablet 1 at bedtime PRN (Patient not taking: Reported on 03/17/2024) 20 tablet 1   meloxicam (MOBIC) 15 MG tablet TAKE 1 TABLET BY MOUTH  EVERY DAY WITH A MEAL 30 tablet 0   methenamine (HIPREX) 1 g tablet Take 1 g by mouth 2 (two) times daily with a meal.     methocarbamol  (ROBAXIN ) 500 MG tablet Take 500 mg by mouth as needed.      minoxidil (LONITEN) 2.5 MG tablet Take  by mouth daily.     nitrofurantoin, macrocrystal-monohydrate, (MACROBID) 100 MG capsule Take 100 mg by mouth daily.     rizatriptan  (MAXALT ) 10 MG tablet Take 1 tablet (10 mg total) by mouth as needed for migraine. Take 1 tablet at onset of migraine. May repeat in 2 hours if needed. (Do not exceed more than 2 tab in 24 hours) 10 tablet 11   rosuvastatin  (CRESTOR ) 10 MG tablet TAKE 1 TABLET(10 MG) BY MOUTH DAILY 90 tablet 3   SYSTANE BALANCE 0.6 % SOLN      tirzepatide  (ZEPBOUND ) 5 MG/0.5ML Pen Inject 5 mg into the skin once a week. 2 mL 0   traMADol  (ULTRAM ) 50 MG tablet Take 1 tablet by mouth 2 (two) times daily as needed.     URIBEL 81.6 MG TABS Take 1 tablet by mouth 3 (three) times daily as needed.     zolpidem  (AMBIEN ) 5 MG tablet Take 1 tablet (5 mg total) by mouth at bedtime as needed for sleep. 32 tablet 5   No current facility-administered medications for this visit.    Musculoskeletal: Strength & Muscle Tone: within normal limits Gait & Station: normal Patient leans: Right  Psychiatric Specialty Exam: Review of Systems  There were no vitals taken for this visit.There is no height or weight on file to calculate BMI.  General Appearance: Fairly Groomed  Eye Contact:  Good  Speech:  Clear and Coherent  Volume:  Normal  Mood:  Anxious  Affect:  Appropriate  Thought Process:  Coherent  Orientation:  Full (Time, Place, and Person)  Thought Content:  WDL  Suicidal Thoughts:  No  Homicidal Thoughts:  No  Memory:  NA  Judgement:  Good  Insight:  Good  Psychomotor Activity:  Normal  Concentration:  Concentration: Good  Recall:  Good  Fund of Knowledge:Good  Language: Good  Akathisia:  No  Handed:  Right  AIMS (if indicated):  not done   Assets:  Desire for Improvement  ADL's:  Intact  Cognition: WNL  Sleep:  Good   Screenings: PHQ2-9    Flowsheet Row Clinical Support from 01/14/2024 in [facility] - [facility] Dept Of [place]. [facility] Office Visit from 05/17/2023 in [facility] - [facility] Dept Of [place]. [facility] Clinical Support from 12/07/2022 in [facility] - [facility] Dept Of [place]. [facility] Office Visit from 04/10/2022 in [facility] - [facility] Of [place]. [facility] Office Visit from 02/02/2022 in [facility] - [facility] Dept Of [place]. [facility]  PHQ-2 Total Score 0 0 0 1 2  PHQ-9 Total Score 0 7 -- 10 9   Flowsheet Row Admission (Discharged) from 05/03/2023 in [facility]  C-SSRS RISK CATEGORY No Risk    Assessment and Plan:    This patient's diagnosis is adjustment disorder with an anxious mood state.  She is off her Ativan  and she takes Celexa  from a different doctor.  She takes 5 mg of Ambien  which works very well for her.  I have no trouble continuing it.  She has had no sleepwalking no falls does not feel confused and gets a great night of sleep.  Patient return to see me in 6 months.  Patient/Guardian was advised Release of Information must be obtained prior to any record release in order to collaborate their care with an outside provider. Patient/Guardian was advised if  they have not already done so to contact the registration department to sign all necessary forms in order for us  to release information regarding their care.   Consent: Patient/Guardian gives verbal consent for treatment and assignment of benefits for services provided during this visit. Patient/Guardian expressed understanding and agreed to proceed.   Elna LILLETTE Lo, MD 9/10/20252:01 PM

## 2024-05-22 ENCOUNTER — Encounter: Payer: Self-pay | Admitting: Rehabilitative and Restorative Service Providers"

## 2024-05-24 ENCOUNTER — Encounter (INDEPENDENT_AMBULATORY_CARE_PROVIDER_SITE_OTHER): Payer: Self-pay | Admitting: Family Medicine

## 2024-05-26 ENCOUNTER — Encounter

## 2024-05-28 ENCOUNTER — Ambulatory Visit: Payer: Self-pay

## 2024-05-28 DIAGNOSIS — R2689 Other abnormalities of gait and mobility: Secondary | ICD-10-CM

## 2024-05-28 DIAGNOSIS — M25551 Pain in right hip: Secondary | ICD-10-CM

## 2024-05-28 DIAGNOSIS — M6281 Muscle weakness (generalized): Secondary | ICD-10-CM

## 2024-05-28 NOTE — Progress Notes (Addendum)
 PATIENT: Molly Giles DOB: 1956/02/10  REASON FOR VISIT: follow up HISTORY FROM: patient  Virtual Visit via MyChart video  I connected with Molly Giles on 05/29/2024 at  2:30 PM EDT via MyChart video and verified that I am speaking with the correct person using two identifiers.   I discussed the limitations, risks, security and privacy concerns of performing an evaluation and management service by Mychart video and the availability of in person appointments. I also discussed with the patient that there may be a patient responsible charge related to this service. The patient expressed understanding and agreed to proceed.   History of Present Illness:  05/29/24 ALL (Mychart): Achaia returns for follow up for migraines and OSA.   Original PSG 03/2017 showed no obvious sleep breathing disorder, however, results limited due to low sleep time. Due to continued headaches and concerns of sleep apneas, HST performed 12/2022 showed severe sleep apnea ( AHI 65.6/h) with clinically relevant hypoxic event and without REM sleep dependency. She had lost 20lbs and was having trouble tolerating pressure settings when seen by Lauraine Born, DNP 03/2023 and autoPAP adjusted from 5-16 to 5-10cmH20. PSG performed 05/2023 showing AHI 32/hr. AutoPAP with settings of 5-16 recommended. After 40+lb weight loss we repeated HST 04/08/2024 showing moderate OSA with AHI 27.2/h. The device did not recognize any central apneas but the recording provided a highly unusual distribution of apneas between REM and non-REM sleep. Due to contradictory data, in lab study. CPAP titration study 04/27/2024 showed AHI apnea-hypopnea index was 3.3/h overall (6.4 /h supine, 0.0 non-supine; and severely accentuated to 20.3/h in all- supine REM). No clinically significant apnea noted under 5 cm water  CPAP pressure with an AHI of 0.7/h. Mild hypoxia and snoring noted. She was advised that CPAP was optional at this point.   Since, she has not  used CPAP recently. She feels she is resting well. She continues to focus on healthy lifestyle habits. On Zepbound . Headaches have been well managed. She continues rizatriptan  as needed.      Observations/Objective:  Generalized: Well developed, in no acute distress  Mentation: Alert oriented to time, place, history taking. Follows all commands speech and language fluent   Assessment and Plan:  68 y.o. year old female  has a past medical history of Abdominal bloating (05/10/2021), Abdominal discomfort, generalized (05/07/2016), Acquired hallux rigidus of right foot (09/15/2022), Acute gastritis (11/19/2020), Allergic rhinoconjunctivitis (07/02/2015), Anal fissure (11/19/2020), Anterior to posterior tear of superior glenoid labrum of left shoulder (02/22/2021), Arthritis, Arthritis of carpometacarpal (CMC) joint of left thumb (09/07/2023), Aseptic loosening of prosthetic hip, initial encounter (11/29/2017), Benign essential hypertension (10/28/2012), Benign positional vertigo (04/2015), Bruxism (teeth grinding), Cervical spondylosis (11/18/2018), Chronic contact dermatitis (08/06/2018), Chronic interstitial cystitis (11/09/2011), Chronic low back pain (05/14/2019), Chronic migraine (02/25/2016), Chronic migraine without aura (05/15/2013), Chronic tonsillitis (01/27/2019), DDD (degenerative disc disease), cervical (11/18/2018), DDD (degenerative disc disease), lumbar, Depression, Disc displacement, lumbar, Dysphagia (10/11/2021), Dysrhythmia, Epiretinal membrane (ERM) of left eye (10/17/2021), Episodic cluster headache, not intractable (03/06/2017), Essential hypertension (05/15/2013), Family history of adverse reaction to anesthesia, Family history of premature CAD (05/15/2013), Fibromyalgia (10/01/2013), Fibromyalgia syndrome (07/01/2015), GERD (gastroesophageal reflux disease) (12/16/2014), H/O seasonal allergies, Hallux rigidus of right foot (09/25/2022), Hammer toe, Hashimoto's thyroiditis, Hearing  loss of both ears (07/27/2015), History of Clostridioides difficile colitis, required fecal transplantation (12/20/2021), History of Clostridium difficile colitis (07/01/2015), History of colonic polyps, 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), History of revision of total hip arthroplasty (04/10/2018), bad fall (02/2015), Hyperhidrosis, scalp,  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), Hyperlipidemia (1998), Hypokalemia due to excessive gastrointestinal loss of potassium (07/28/2019), Hypothyroidism, Iliopsoas bursitis of left hip (05/08/2022), Impairment of balance (02/2015), Injury of triangular fibrocartilage complex (TFCC) (02/25/2019), Injury of triangular fibrocartilage complex of left wrist (02/25/2019), Insulin  resistance (07/04/2017), Internal hemorrhoid (01/28/2021), Interstitial cystitis, Irritable bowel syndrome with diarrhea (04/03/2016), Keratoconjunctivitis sicca (11/18/2018), Left ventricular hypertrophy, mild (02/25/2016), 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03/10/2018), Low serum vitamin B12 (01/10/2023), Lumbar facet joint pain, Meibomian gland dysfunction (MGD) of both eyes (10/17/2021), 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), Metatarsalgia of both feet (07/31/2022), Mood disorder, Morbid obesity (HCC) (03/06/2017), Morton's neuroma of right foot (09/15/2022), Musculoskeletal neck pain (07/14/2015), Nocturnal hypoxemia (03/06/2017), Normal coronary arteries (05/14/2014), Nuclear sclerosis of both eyes (10/17/2021), OAB (overactive bladder) (03/02/2021), Obesity (BMI 30.0-34.9) (01/29/2017), Odynophagia (12/20/2021), OSA (obstructive sleep apnea) (12/27/2022), Osteoarthritis of acromioclavicular joint (02/22/2021), Osteoarthritis of left hip (11/01/2015), 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, lumbar region (10/30/2011), Other insomnia (11/09/2016), Overweight, Pain in joint of left shoulder (11/09/2016), Pain in joint, multiple sites (11/18/2018), Palpitations (05/15/2013), Perianal  candidiasis (12/20/2021), 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), Perirectal cyst (05/07/2016), Plantar fasciitis of right foot (11/10/2022), Poison ivy dermatitis (03/24/2021), PONV (postoperative nausea and vomiting), Positive ANA (antinuclear antibody) (11/18/2018), Post concussion syndrome (06/06/2015), Post concussive syndrome (07/14/2015), Posterior vitreous detachment of right eye (2014), Premature menopause (12/20/2021), Primary osteoarthritis of left knee (06/20/2018), Psychophysiological insomnia (11/07/2022), Pure hypercholesterolemia (05/15/2013), Rectal abscess (05/10/2021), S/P left THA, 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), S/P revision of total hip (04/10/2018), S/P tendon repair (01/23/2024), Sacroiliac inflammation (08/18/2021), Severe obstructive sleep apnea-hypopnea syndrome (12/22/2022), Shingles, Shortness of breath dyspnea, 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), Sjogren syndrome (01/27/2019), Sjogren's syndrome (01/27/2019), Sleep walking and eating (03/06/2017), Snoring (03/06/2017), Spondylosis of 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 here with    ICD-10-CM   1. Chronic migraine without aura without status migrainosus, not intractable  G43.709     2. OSA (obstructive sleep apnea)  G47.33      Lurline reports migraines are significantly improved on Vyepti 300mg  q12w. She will continue rizatriptan  for abortive therapy. Last CPAP titration showed no significant sleep apnea with lowest pressure settings. We have reviewed  sleep study details. She was advised to stop CPAP for now and we will continue to monitor headaches. She will continue healthy lifestyle habits. She will follow-up with me in 1 year, sooner if needed.  She verbalizes understanding and agreement with this plan.   No orders of the defined types were placed in this encounter.   No orders of the defined types were placed in this encounter.    Follow Up Instructions:  I discussed the assessment and treatment plan with the patient. The patient was provided an opportunity to ask questions and all were answered. The patient agreed with the plan and demonstrated an understanding of the instructions.   The patient was advised to call back or seek an in-person evaluation if the symptoms worsen or if the condition fails to improve as anticipated.  I provided 30 minutes of face-to-face and non face-to-face time during this MyChart video encounter. Patient located at their place of residence. Provider is in the office.    Chaniah Cisse, NP

## 2024-05-28 NOTE — Therapy (Signed)
 OUTPATIENT PHYSICAL THERAPY LOWER EXTREMITY TREATMENT  Patient Name: Molly Giles MRN: [id number] DOB:16-Jul-1956, 68 y.o., female Today's Date: 05/28/2024  END OF SESSION:  PT End of Session - 05/28/24 1534     Visit Number 16    Number of Visits 19    Date for PT Re-Evaluation 06/18/24    Authorization Type medicare and BCBS supplemental    Progress Note Due on Visit 20    PT Start Time 1534    PT Stop Time 1615    PT Time Calculation (min) 41 min    Activity Tolerance Patient tolerated treatment well    Behavior During Therapy [facility] for tasks assessed/performed           Past Medical History:  Diagnosis Date   Abdominal bloating 05/10/2021   Abdominal discomfort, generalized 05/07/2016   Acquired hallux rigidus of right foot 09/15/2022   Acute gastritis 11/19/2020   Allergic rhinoconjunctivitis 07/02/2015   Anal fissure 11/19/2020   Anterior to posterior tear of superior glenoid labrum of left shoulder 02/22/2021   Arthritis    Arthritis of carpometacarpal (CMC) joint of left thumb 09/07/2023   Aseptic loosening of prosthetic hip, initial encounter (HCC) 11/29/2017   Benign essential hypertension 10/28/2012   Benign positional vertigo 04/2015   Responded well to [facility]   Bruxism (teeth grinding)    Cervical spondylosis 11/18/2018   Chronic contact dermatitis 08/06/2018   Per allergist Dr Almarie Scala.    Chronic interstitial cystitis 11/09/2011   Managed by [facility] urology branch in [place] hydrodistention 5 years ago with Dr Claudene in [place], has relief of symptoms until now     Chronic low back pain 05/14/2019   Chronic migraine 02/25/2016   Per Dr Ines review in notes from [facility] from September 2015. Showed total headache days last month 18. Severe headache days 7 days. Moderate headache days 5 days. Mild headache days last month sick days. Days without headache last month 10 days. Symptoms associated with photophobia, phonophobia,  osmophobia, neck pain, dizziness, jaw pain, nasal congestion, vision disturbances, tingling and numbness, weakness and worsening with activity. Each headache attack last 3 hours depending on treatment in severity. Left side, the right side, easier side, the frontal area in the back of the head. Characterized as throbbing, pressure, tightness, squeezing, stabbing and burning    Chronic migraine without aura 05/15/2013    Dr Ines [facility]   Chronic tonsillitis 01/27/2019   DDD (degenerative disc disease), cervical 11/18/2018   DDD (degenerative disc disease), lumbar    Depression    Disc displacement, lumbar    Dysphagia 10/11/2021   Dysrhythmia    seen by dr Blanca- not a problem since she has been on Bystolic    Epiretinal membrane (ERM) of left eye 10/17/2021   Periodic ophthalmological monitoring     Episodic cluster headache, not intractable 03/06/2017   Essential hypertension 05/15/2013   Family history of adverse reaction to anesthesia    Brother- N/V   Family history of premature CAD 05/15/2013   Fibromyalgia 10/01/2013   Management by Dr GORMAN. Devashwar ([facility]he[facility])     Fibromyalgia syndrome 07/01/2015   Management by Dr GORMAN. Devashwar ([facility])    GERD (gastroesophageal reflux disease) 12/16/2014   H/O seasonal allergies    Hallux rigidus of right foot 09/25/2022   Hammer toe    Left great toe   Hashimoto's thyroiditis    Per patient, diagnosed by Dr. Sharyne Pacini   Hearing loss of both  ears 07/27/2015   mild to borderline moderate low frequency hearing loss improving to within normal limits bilaterally on audiology testing at [facility] in November 2016.     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   Hyperlipidemia 1998   Hypokalemia due to excessive gastrointestinal loss of potassium 07/28/2019   Hypothyroidism    Iliopsoas bursitis of left hip 05/08/2022   Impairment of balance 02/2015   Consequent of postconcussive syndrome   Injury of triangular fibrocartilage complex (TFCC) 02/25/2019   Injury of triangular fibrocartilage complex of left wrist 02/25/2019   Dx 02/25/19 Prentice Pagan IV MD ([facility])   Insulin  resistance 07/04/2017   Internal hemorrhoid 01/28/2021   Internal hemorrhoid seen on colonoscopy 10/2020 REGINOLD Sol MD [facility])   Interstitial cystitis    Irritable bowel syndrome with diarrhea 04/03/2016   Keratoconjunctivitis sicca 11/18/2018   (+) ANA     Left ventricular hypertrophy, mild 02/25/2016   ECHOcardiogram report 06/17/15 showing EF55-60%, mild LVH and G1DD    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HCC) 03/10/2018   [facility]   Low serum vitamin B12 01/10/2023   Lumbar facet joint pain    Meibomian gland dysfunction (MGD) of both eyes 10/17/2021   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  Added automatically from request for surgery [id number]     Formatting of this note might be different from the original.  Added automatically from request for surgery [id number]     Obesity (BMI 30.0-34.9) 01/29/2017   Odynophagia 12/20/2021   OSA (obstructive sleep apnea) 12/27/2022   Dr Dohmeier polysomnography titration study 04/27/24  There  was no clinically significant apnea noted under 5 cm water  CPAP pressure with an AHI of 0.7/h.  What was present was hypoxia- not severe, and loud snoring.   CMS AHI was 0/h under 6 cm water  CPAP which reduced the snoring somewhat and reduced hypoxia. Higher CPAP pressures induced more apneas and hypopneas.  The weight loss has reduced the   Osteoarthritis of acromioclavicular joint 02/22/2021   Osteoarthritis of left hip 11/01/2015   MRI order by Dr Ernie (ortho) 10/2015 showed significant arthritis of left hip joint with cystic changes in femoral head c/w osteoarthritis   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, lumbar region 10/30/2011   Other insomnia 11/09/2016   Overweight    Pain in joint of left shoulder 11/09/2016   Pain in joint, multiple sites 11/18/2018   Palpitations 05/15/2013   [facility] manages   Perianal candidiasis 12/20/2021   Periodic limb movement sleep disorder 03/28/2017   Perirectal cyst 05/07/2016   Plantar fasciitis of right foot 11/10/2022   Poison ivy dermatitis 03/24/2021   PONV (postoperative nausea and vomiting)    Positive ANA (antinuclear  antibody) 11/18/2018   Post concussion syndrome 06/06/2015   Post concussive syndrome 07/14/2015   Ms Pfund's post-concussive syndrome manifesting in vertigo and headache, mood changes, poor balance, dizziness, and decreased concentration per Dr Ines at [facility].    Posterior vitreous detachment of right eye 2014   Premature menopause 12/20/2021   Primary osteoarthritis of left knee 06/20/2018   Psychophysiological insomnia 11/07/2022   Pure hypercholesterolemia 05/15/2013   Rectal abscess 05/10/2021   S/P left THA, AA 07/25/2016   S/P revision of total hip 04/10/2018   S/P tendon repair 01/23/2024   Sacroiliac inflammation (HCC) 08/18/2021   Severe obstructive sleep apnea-hypopnea syndrome 12/22/2022   Shingles    Shortness of breath dyspnea    with exertion   Sicca syndrome  (HCC) 11/18/2018   (+) ANA   Sjogren syndrome (HCC) 01/27/2019   Sjogren's syndrome (HCC) 01/27/2019   Sleep walking and eating 03/06/2017   Snoring 03/06/2017   Spondylosis of lumbar region without myelopathy or radiculopathy 10/30/2011   Status post total hip replacement, left 09/20/2017   TFC (triangular fibrocartilage complex) injury 02/25/2019   Thyroid  nodule 08/11/2009   Findings: The thyroid  gland is within normal limits in size.  The gland is diffusely inhomogeneous. A small solid nodule is noted in the lower pole  medially on the right of 7 x 6 x 8 mm. A small solid nodule is noted inferiorly on the left of 3 x 3 x 4 mm.  IMPRESSION:  The thyroid  gland is within normal limits in size with only small solid nodules present, the largest of only 8 mm in diameter on the right.     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    Trochanteric bursitis, right hip 04/26/2020   Tubular adenoma of colon 01/28/2021   Colonoscopy screening 4 mm tubular Adenoma polyp Marilu Sol, MD [facility])   Tubular adenoma of colon 01/28/2021   Colonoscopy screening 4 mm tubular Adenoma polyp Marilu Sol, MD [facility])   Vasomotor symptoms due to menopause 04/19/2017   Vertigo 03/19/2023   Vitamin D  deficiency 05/08/2017   Vulvitis 07/22/2020   Yeast vaginitis 07/11/2019   Past Surgical History:  Procedure Laterality Date   arthroscopy Left 01/2022   with SAD and DCR, K. Supple MD   Bladder dilitation     x 3   BREAST BIOPSY Right 2011   Benign histology   CARDIOVASCULAR STRESS TEST  2000   Unremarkable per pt report   CARPOMETACARPAL JOINT ARTHROTOMY Right 2011   COLONOSCOPY     COLONOSCOPY WITH PROPOFOL  N/A 04/21/2015   Procedure: COLONOSCOPY WITH PROPOFOL ;  Surgeon: Belvie Just, MD;  Location: [facility] END[facility]COPY;  Service: Endoscopy;  Laterality: N/A;   CYSTOSCOPY W/ DILATION OF BLADDER N/A    EPIDURAL BLOCK INJECTION Left  04/12/2016   Left Medial Nerve Block and Left L5 ramus block, Dr Charlie Dolores    EPIDURAL BLOCK INJECTION  03/21/2016   Left L3-4 medial branch block and Left L5 & dorsal ramus block    EPIDURAL BLOCK INJECTION N/A 10/25/2016   Charlie Dolores, MD. Lumbar medial branch block   EPIDURAL BLOCK INJECTION N/A 02/09/2017   Charlie Dolores, MD.  Bilateral L3/4 medial branch block, bilateral L5 dorsal ramus block   EPIDURAL BLOCK INJECTION N/A 07/04/2017   Charlie Dolores, MD   EXCISION MORTON'S NEUROMA Right 05/03/2023   Procedure: EXCISION MORTON'S NEUROMA;  Surgeon: Kit Rush, MD;  Location: [facility];  Service: Orthopedics;  Laterality: Right;  general, local by surgeon 60 MIN   FECAL TRANSPLANT  04/21/2015   Procedure: FECAL TRANSPLANT;  Surgeon: Belvie Just, MD;  Location: [facility] ENDOSCOPY;  Service: Endoscopy;;   HIP ADDUCTOR TENOTOMY  01/2024   HIP ARTHROPLASTY Left    HIP ARTHROSCOPY Left 03/06/2018   Left hip arthroplasty, redo for loose hip arthroplasty. Procedure at [facility] hospital   INJECTION HIP INTRA ARTICULAR Left 11/2015   for OA by Dr Charlie Bonner REDBIRD IMPLANT PLACEMENT  04/2021   Norleen Sharps MD ([facility])   INTERSTIM IMPLANT REVISION N/A 09/2021   Norleen Sharps MD ([facility])   LIGAMENT REPAIR Right 05/03/2023   Procedure: COLLATERAL LIGAMENT REPAIR;  Surgeon: Kit Norleen, MD;  Location: [facility];  Service: Orthopedics;  Laterality: Right;  general, local by surgeon 60 MIN   OTHER SURGICAL HISTORY Left 2016   Left L3/L4 medial nerve block and Left L5 Dorsal Ramus block Dr FABIENE Bonner   TOTAL HIP ARTHROPLASTY Left 07/25/2016   Procedure: LEFT TOTAL HIP ARTHROPLASTY ANTERIOR APPROACH;  Surgeon: Donnice Car, MD;  Location: [facility];  Service: Orthopedics;  Laterality: Left;   WEIL OSTEOTOMY Right 05/03/2023   Procedure: RIGHT 2ND WEIL OSTEOTOMY;  Surgeon: Kit Norleen, MD;  Location:  SURGERY CENTER;   Service: Orthopedics;  Laterality: Right;  general, local by surgeon 60 MIN   Patient Active Problem List   Diagnosis Date Noted   Chronic pain of left upper extremity 02/19/2024   Arthritis of carpometacarpal [facility] joint of left thumb 09/07/2023   BMI 26.0-26.9,adult 02/07/2023   Low serum vitamin B12 01/10/2023   OSA (obstructive sleep apnea) 12/27/2022   Severe obstructive sleep apnea-hypopnea syndrome 12/22/2022   Hyperglycemia 12/11/2022   Generalized obesity 12/11/2022   SOBOE (shortness of breath on exertion) 12/11/2022   Other fatigue 12/11/2022   Sleep related headaches 11/07/2022   Intractable episodic cluster headache 11/07/2022   Psychophysiological insomnia 11/07/2022   Hallux rigidus of right foot 09/25/2022   Chronic pain of right knee 05/08/2022   Arthrosis of hand 12/20/2021   History of Clostridioides difficile colitis, required fecal transplantation 12/20/2021   Meibomian gland dysfunction (MGD) of both eyes 10/17/2021   Nuclear sclerosis of both eyes 10/17/2021   Epiretinal membrane (ERM) of left eye 10/17/2021   OAB (overactive bladder) 03/02/2021   Osteoarthritis of acromioclavicular joint 02/22/2021   Hyperhidrosis, scalp, 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   Chronic low back pain 05/14/2019   Sjogren syndrome (HCC) 01/27/2019   DDD (degenerative disc disease), cervical 12/27/2018   Cervical spondylosis 11/18/2018   DDD (degenerative disc disease), lumbar 11/18/2018   Keratoconjunctivitis sicca 11/18/2018   Chronic contact dermatitis 08/06/2018   Primary osteoarthritis of left knee 06/20/2018   Insulin  resistance 07/04/2017   Irritable bowel syndrome with diarrhea 04/03/2016   Left ventricular hypertrophy, mild 02/25/2016   Allergic rhinoconjunctivitis 07/02/2015   History of colonic polyps 07/01/2015   GERD (gastroesophageal reflux disease) 12/16/2014   Fibromyalgia 10/01/2013   Mood disorder (HCC) 10/01/2013   Hyperlipidemia 10/01/2013   Pure hypercholesterolemia  05/15/2013   Chronic migraine without aura 05/15/2013   Benign essential hypertension 10/28/2012   Hypothyroidism 11/09/2011   Chronic interstitial cystitis 11/09/2011    PCP: McDiarmid, Krystal, MD REFERRING PROVIDER: Daniel Norleen, PA-C REFERRING DIAG: s/p tendon repair Z 98.890  THERAPY DIAG:  Muscle weakness (generalized)  Other abnormalities of gait and mobility  Pain in right hip  Rationale for Evaluation and Treatment: Rehabilitation  ONSET DATE: 01/23/24   SUBJECTIVE:  SUBJECTIVE STATEMENT: Patient reports pain is better compared to last visit. Hip still gets sore with walking long distances. Was able to do some uphill/downhill walking and notices difficulty with balance. Sometimes feels a pinch about lateral hip when she turns.   PERTINENT HISTORY: HTN, hypercholesterolemia, fibromyalgia, migraines, arthritis, concussion PAIN:  Are you having pain? Yes: NPRS scale: 5/10 Pain location: Rt QL,low back Pain description: constant,ache Aggravating factors: sleeping, overuse  Relieving factors: none--mild discomfort  PRECAUTIONS: Other: The patient will be TDWB x 6 weeks, Abductor hip precautions x 6 weeks to the right side.  WEIGHT BEARING RESTRICTIONS: Yes initially TDWB x 6 weeks (she is beyond that per surgical date)  FALLS:  Has patient fallen in last 6 months? No  LIVING ENVIRONMENT: Lives with: lives with their spouse Lives in: House/apartment Stairs: Yes: Internal: 12-14 steps; on right going up Has following equipment at home: Single point cane  PATIENT GOALS: No pain, no cane  NEXT MD VISIT: early September  OBJECTIVE:  Note: Objective measures were completed at Evaluation unless otherwise noted.  PATIENT SURVEYS:  LEFS : 47.5%  COGNITION: Overall cognitive status: Within functional limits for tasks assessed     SENSATION: WFL  POSTURE: No Significant postural limitations  PALPATION: Mild soreness in lateral hip  LOWER EXTREMITY ROM: AROM to  90 degrees hip flexion Passive ROM Right eval Left eval Right 03/27/24 Right 04/07/24  Hip flexion 95  In quadriped 120 deg Can sit into child's pose  Hip extension      Hip abduction 35     Hip adduction      Hip internal rotation 30     Hip external rotation 30     Knee flexion      Knee extension      Ankle dorsiflexion      Ankle plantarflexion      Ankle inversion      Ankle eversion       (Blank rows = not tested)  LOWER EXTREMITY MMT: did not test due to post surgical status-- will assess as we initiate strengthening progression in PT MMT Right eval Left   Hip flexion 4/5 5/5  Hip extension 3+/5 4+/5  Hip abduction 4/5 *unable to tolerate laying on R side *detect weakness with h/o THR 2018  Hip adduction    Hip internal rotation 4/5 5/5  Hip external rotation 3+/5 3/5  Knee flexion 3+/5 4+/5  Knee extension 5/5 5/5  Ankle dorsiflexion 5/5 5/5  Ankle plantarflexion    Ankle inversion    Ankle eversion     (Blank rows = not tested)  BALANCE: R single limb x 2 seconds  L single limb x 10 seconds  GAIT: Distance walked: 150 ft Assistive device utilized: Single point cane and None Level of assistance: Modified independence Comments: 3.2 ft/sec-- patient does start off without a limp and after 100 ft has a R antalgic gait pattern *PT and patient discussed still needing the cane when outdoors and in the evening when fatigued [facility] Adult PT Treatment:                                                DATE: 05/28/24 Therapeutic Exercise: Glute stretch 2 x 20 sec IT band stretch 2 x 20 sec  Stool IR/ER x 10   Neuromuscular re-ed: Lateral step ups on airex 2 x 10  Clamshell 2 x 10  Supine TA march 2 x 10  Therapeutic Activity: Step up knee driver x 10; 6 inch step  Walking in clinic with purposeful turning   Self Care: Walking program   [facility] Adult PT Treatment:                                                DATE: 05/21/24 Therapeutic Exercise: Supine Trunk  rotation lumbar  Bridges with toes raised for HS engagement Standing Step ups R and L x 10 reps dec'ing UE support Manual Therapy: STM in sidelying for R glut medius with IASTM IASTM/STM along IT band sidelying Self release using massage ball at the wall Neuromuscular re-ed: Single limb stance focused on hip stabilization and keeping pelvis level R and L LE Therapeutic Activity: Lunge isometric holds with counter rotation High lunge with isometric holds Leg press with blue band at thights 65# x 15 reps, then moved up to 70# x 12 reps-- fatigue after rising from machine   [facility] Adult PT Treatment:                                                DATE: 05/05/24 Therapeutic Exercise: LTR with figure 4 x 1 min each  Updated HEP   Neuromuscular re-ed: Hooklying hip abduction blue band 2 x 10  Supine TA march with resistance blue band 2 x 10  Standing hip abduction red band 2 x 10  Standing hip extension red band 2 x 10  SLS fingertip support multiple reps RLE Therapeutic Activity: Leg press with blue band at thighs  x 15 @ 45 lbs, x 15 @ 65 lbs  Treadmill x 5 minutes speed 2.0, incline 3   PATIENT EDUCATION:  Education details: HEP update Person educated: Patient Education method: Explanation, demo, cues, handout Education comprehension: verbalized understanding, returned demo, cues   HOME EXERCISE PROGRAM: Access Code: [facility] URL: [email or web address] Date: 05/28/2024 Prepared by: Lucie Meeter  Exercises - Side Stepping with Resistance at Ankles  - 1 x daily - 3-4 x weekly - 1 sets - 10 reps - Standing Hip Abduction with Resistance at Ankles and Counter Support  - 1 x daily - 3-4 x weekly - 2 sets - 10 reps - Standing Hip Extension with Resistance at Ankles and Counter Support  - 1 x daily - 3-4 x weekly - 2 sets - 10 reps - Standing Hamstring Curl with Resistance  - 1 x daily - 3-4 x weekly - 2 sets - 10 reps - Standing Quadratus Lumborum Stretch with  Doorway  - 1 x daily - 3-4 x weekly - 1 sets - 3 reps - 20 seconds hold - Supine Active Straight Leg Raise  - 1 x daily - 3-4 x weekly - 1 sets - 10 reps - Sidelying Hip Abduction  - 1 x daily - 3-4 x weekly - 1 sets - 10 reps - Prone Hip Extension  - 1 x daily - 3-4 x weekly - 2 sets - 10 reps - Seated Hip External Rotation AROM  - 1 x daily - 3-4 x weekly - 1 sets -  10 reps - Single Leg Stance with Support  - 1 x daily - 7 x weekly - 3 sets - max  hold - Lunge with Resistance Rotation Pull  - 1 x daily - 4 x weekly - 1 sets - 10 reps - Supine Gluteus Stretch  - 1 x daily - 7 x weekly - 2 sets - 20 sec hold  Patient Education - walking program   ASSESSMENT: CLINICAL IMPRESSION: Patient reports improvement in hip pain compared to previous session. With standing activity she fatigues quickly in the glute with lateral and forward step ups, but no reports of hip pain. No LOB noted with stepping on unstable surface. Does require occasional use of UE support with step up knee driver, but maintains proper lumbopelvic stability. We discussed beginning to implement a walking program to build her overall endurance with handout provided. She reported reduction in back pain at conclusion.   EVAL: Patient is a 68 y.o. female who was seen today for physical therapy evaluation and treatment s/p R glut med repair. She is 6.5 weeks post op today and cleared for WBAT and to begin strengthening. PT initiated HEP today for gentle ROM, glut activations, and to also address R QL pain. Patient feels that cane use is flaring other pain including L 1st MCP joint, L shoulder, and R QL. PT discussed the rationale for cane use discussing goal to normalize gait and avoid an antalgic pattern, therefore, patient to use when in the community and in the evening if she is fatigued. PT to address deficits and promote return to prior functional status.   OBJECTIVE IMPAIRMENTS: Abnormal gait, decreased activity tolerance, decreased  balance, decreased mobility, difficulty walking, decreased ROM, decreased strength, increased fascial restrictions, impaired flexibility, and pain.   GOALS: Goals reviewed with patient? Yes  SHORT TERM GOALS: Target date: 04/16/24  The patient will be indep with initial HEP Baseline: initiated at eval Goal status:MET  2.  The patient will improve LEFS by 8% to demonstrate improved functional abilities. Baseline:  47.5% Goal status: MET= at 62.5% on 8/4  3.   The patient will  demonstrate normal gait pattern with no evidence of antalgic pattern x 500 ft to d/c cane for community distances. Baseline:  Gets antalgic pattern after 100 ft in clinic Goal status: PARTIALLY MET  4.  The patient will maintain single leg standing R side x 8 seconds to demo good hip stability for functional loading. Baseline:  2 seconds, L is 10 seconds. Goal status: MET on 03/27/24 10 seconds each side  LONG TERM GOALS: Target date: 05/17/24  The patient will be indep with progression of HEP. Baseline:  initiated at eval Goal status: MET  2.  The patient will improve LEFS by 15% to demonstrate improved functional abilities. Baseline: 47.5% Goal status: REVISED TO NEW LTG DATE -- has new onset pain due to recent slip/fall in home.  3.  The patient will report no pain in R quadratus lumborum region demonstrating reduced compensatory strategies with gait. Baseline:  Is using QL for hip hike and has pain at eval Goal status: PARTIALLY MET-- has mild pain in R QL during standing hip abduction (LLE)-- when stabilizing with R LE  4.  The patient will be able to negotiate 12 steps with one rail and reciprocal pattern mod indep. Baseline:  has done steps 1x since d/c home Goal status: MET  5.  The patient will demo improved R LE strength demonstrating SLR x 10 reps. Baseline:  to assess MMT Goal status: MET  6. The patient will have negative positional testing indicating resolution of BPPV.  Baseline: + R  BPPV  Goal status: MET    UPDATED GOALS: LONG TERM GOALS: Target date: 06/18/24  The patient will be indep with progression of HEP. Baseline:  to progress Goal status: REVISED  2.  The patient will improve LEFS by 15% to demonstrate improved functional abilities. Baseline: 47.5% Goal status: REVISED TO NEW LTG DATE -- has new onset pain due to recent slip/fall in home.  3.  The patient will report no pain in R quadratus lumborum region demonstrating reduced compensatory strategies with gait. Baseline:  Is using QL for hip hike and has pain at eval Goal status: REVISED  4.  The patient will demonstrate standing L hip abduction without compensatory R leaning. Baseline:  demonstrates trunk lateral lean Goal status: NEW PLAN:  PT FREQUENCY1x/week  PT DURATION: 4 weeks  PLANNED INTERVENTIONS: [address]2835- PT Re-evaluation, 97750- Physical Performance Testing, 97110-Therapeutic exercises, 97530- Therapeutic activity, [id number]- Neuromuscular re-education, 97535- Self Care, 02859- Manual therapy, [phone]- Gait training, [phone]- Aquatic Therapy, [phone]- Electrical stimulation (unattended), [phone] (1-2 muscles), [phone] (3+ muscles)- Dry Needling, Patient/Family education, Balance training, Stair training, Taping, Joint mobilization, Cryotherapy, and Moist heat  PLAN FOR NEXT SESSION: Progress HEP for R hip strengthening, normalize gait pattern without device, work on QL lengthening due to pain, work on stairs and R LE stance control.  treadmill working on incline for negotiating driveway; discuss appropriate gym activity   Lucie Meeter, PT, DPT, ATC 05/28/24 4:17 PM

## 2024-05-28 NOTE — Progress Notes (Signed)
 SABRA

## 2024-05-29 ENCOUNTER — Telehealth (INDEPENDENT_AMBULATORY_CARE_PROVIDER_SITE_OTHER): Admitting: Family Medicine

## 2024-05-29 DIAGNOSIS — G4733 Obstructive sleep apnea (adult) (pediatric): Secondary | ICD-10-CM

## 2024-05-29 DIAGNOSIS — G43709 Chronic migraine without aura, not intractable, without status migrainosus: Secondary | ICD-10-CM

## 2024-06-03 ENCOUNTER — Ambulatory Visit: Admitting: Rehabilitative and Restorative Service Providers"

## 2024-06-03 ENCOUNTER — Encounter: Payer: Self-pay | Admitting: Family Medicine

## 2024-06-03 ENCOUNTER — Encounter: Payer: Self-pay | Admitting: Rehabilitative and Restorative Service Providers"

## 2024-06-03 DIAGNOSIS — M6281 Muscle weakness (generalized): Secondary | ICD-10-CM

## 2024-06-03 DIAGNOSIS — R2689 Other abnormalities of gait and mobility: Secondary | ICD-10-CM

## 2024-06-03 DIAGNOSIS — M25551 Pain in right hip: Secondary | ICD-10-CM

## 2024-06-03 NOTE — Therapy (Addendum)
 "  OUTPATIENT PHYSICAL THERAPY LOWER EXTREMITY TREATMENT and ON HOLD NOTE/ DISCHARGE NOTE  Patient Name: Molly Giles MRN: [id number] DOB:[birth year], 68 y.o., female Today's Date: 06/03/2024   PHYSICAL THERAPY DISCHARGE SUMMARY  Visits from Start of Care: 17  Current functional level related to goals / functional outcomes: SEE BELOW FOR LAST KNOWN STATUS   Remaining deficits: Hip soreness since a recent fall-- doing HEP   Education / Equipment: HEP   Patient agrees to discharge. Patient goals were partially met. Patient is being discharged due to meeting the stated rehab goals.  END OF SESSION:  PT End of Session - 06/03/24 1447     Visit Number 17    Number of Visits 19    Date for Recertification  06/18/24    Authorization Type medicare and BCBS supplemental    Progress Note Due on Visit 20    PT Start Time 1447    PT Stop Time 1528    PT Time Calculation (min) 41 min    Activity Tolerance Patient tolerated treatment well    Behavior During Therapy [facility] for tasks assessed/performed          Past Medical History:  Diagnosis Date   Abdominal bloating 05/10/2021   Abdominal discomfort, generalized 05/07/2016   Acquired hallux rigidus of right foot 09/15/2022   Acute gastritis 11/19/2020   Allergic rhinoconjunctivitis 07/02/2015   Anal fissure 11/19/2020   Anterior to posterior tear of superior glenoid labrum of left shoulder 02/22/2021   Arthritis    Arthritis of carpometacarpal (CMC) joint of left thumb 09/07/2023   Aseptic loosening of prosthetic hip, initial encounter 11/29/2017   Benign essential hypertension 10/28/2012   Benign positional vertigo 04/2015   Responded well to [facility]   Bruxism (teeth grinding)    Cervical spondylosis 11/18/2018   Chronic contact dermatitis 08/06/2018   Per allergist Dr Almarie Scala.    Chronic interstitial cystitis 11/09/2011   Managed by [facility] urology branch in [place] hydrodistention 5 years ago with Dr Claudene  in [place], has relief of symptoms until now     Chronic low back pain 05/14/2019   Chronic migraine 02/25/2016   Per Dr Ines review in notes from [facility] from September 2015. Showed total headache days last month 18. Severe headache days 7 days. Moderate headache days 5 days. Mild headache days last month sick days. Days without headache last month 10 days. Symptoms associated with photophobia, phonophobia, osmophobia, neck pain, dizziness, jaw pain, nasal congestion, vision disturbances, tingling and numbness, weakness and worsening with activity. Each headache attack last 3 hours depending on treatment in severity. Left side, the right side, easier side, the frontal area in the back of the head. Characterized as throbbing, pressure, tightness, squeezing, stabbing and burning    Chronic migraine without aura 05/15/2013    Dr Ines [facility]   Chronic tonsillitis 01/27/2019   DDD (degenerative disc disease), cervical 11/18/2018   DDD (degenerative disc disease), lumbar    Depression    Disc displacement, lumbar    Dysphagia 10/11/2021   Dysrhythmia    seen by dr Blanca- not a problem since she has been on Bystolic    Epiretinal membrane (ERM) of left eye 10/17/2021   Periodic ophthalmological monitoring     Episodic cluster headache, not intractable 03/06/2017   Essential hypertension 05/15/2013   Family history of adverse reaction to anesthesia    Brother- N/V   Family history of premature CAD 05/15/2013   Fibromyalgia 10/01/2013  Management by Dr GORMAN. Devashwar (Rhe[facility])     Fibromyalgia syndrome 07/01/2015   Management by Dr GORMAN. Devashwar ([facility])    GERD (gastroesophageal reflux disease) 12/16/2014   H/O seasonal allergies    Hallux rigidus of right foot 09/25/2022   Hammer toe    Left great toe   Hashimoto's thyroiditis    Per patient, diagnosed by Dr. Sharyne Pacini   Hearing loss of both ears 07/27/2015   mild to borderline moderate low  frequency hearing loss improving to within normal limits bilaterally on audiology testing at [facility] in November 2016.     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   Hyperlipidemia 1998   Hypokalemia due to excessive gastrointestinal loss of potassium 07/28/2019   Hypothyroidism    Iliopsoas bursitis of left hip 05/08/2022   Impairment of balance 02/2015   Consequent of postconcussive syndrome   Injury of triangular fibrocartilage complex (TFCC) 02/25/2019   Injury of triangular fibrocartilage complex of left wrist 02/25/2019   Dx 02/25/19 Prentice Pagan IV MD ([facility])   Insulin  resistance 07/04/2017   Internal hemorrhoid 01/28/2021   Internal hemorrhoid seen on colonoscopy 10/2020 REGINOLD Sol MD [facility])   Interstitial cystitis    Irritable bowel syndrome with diarrhea 04/03/2016   Keratoconjunctivitis sicca 11/18/2018   (+) ANA     Left ventricular hypertrophy, mild 02/25/2016   ECHOcardiogram report 06/17/15 showing EF55-60%, mild LVH and G1DD    Loose total hip arthroplasty 03/10/2018   [facility]   Low serum vitamin B12 01/10/2023   Lumbar facet joint pain    Meibomian gland dysfunction (MGD) of both eyes 10/17/2021   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   Metatarsalgia of both feet 07/31/2022   Mood disorder    Morbid obesity (HCC) 03/06/2017   Morton's neuroma of right foot 09/15/2022   Musculoskeletal neck pain 07/14/2015   Nocturnal hypoxemia 03/06/2017   Normal coronary arteries 05/14/2014   Nuclear sclerosis of both eyes 10/17/2021   OAB (overactive bladder)  03/02/2021   Formatting of this note might be different from the original.  Added automatically from request for surgery [id number]     Formatting of this note might be different from the original.  Added automatically from request for surgery [id number]     Obesity (BMI 30.0-34.9) 01/29/2017   Odynophagia 12/20/2021   OSA (obstructive sleep apnea) 12/27/2022   Dr Dohmeier polysomnography titration study 04/27/24  There was no clinically significant apnea noted under 5 cm water  CPAP pressure with an AHI of 0.7/h.  What was present was hypoxia- not severe, and loud snoring.   CMS AHI was 0/h under 6 cm water  CPAP which reduced the snoring somewhat and reduced hypoxia. Higher CPAP pressures induced more apneas and hypopneas.  The weight loss has reduced the   Osteoarthritis of acromioclavicular joint 02/22/2021   Osteoarthritis of left hip 11/01/2015   MRI order by Dr Ernie (ortho) 10/2015 showed significant arthritis of left hip joint with cystic changes in femoral head c/w osteoarthritis   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, lumbar region 10/30/2011   Other insomnia 11/09/2016   Overweight  Pain in joint of left shoulder 11/09/2016   Pain in joint, multiple sites 11/18/2018   Palpitations 05/15/2013   [facility] manages   Perianal candidiasis 12/20/2021   Periodic limb movement sleep disorder 03/28/2017   Perirectal cyst 05/07/2016   Plantar fasciitis of right foot 11/10/2022   Poison ivy dermatitis 03/24/2021   PONV (postoperative nausea and vomiting)    Positive ANA (antinuclear antibody) 11/18/2018   Post concussion syndrome 06/06/2015   Post concussive syndrome 07/14/2015   Ms Plamondon's post-concussive syndrome manifesting in vertigo and headache, mood changes, poor balance, dizziness, and decreased concentration per Dr Ines at [facility].    Posterior vitreous detachment of right eye 2014   Premature menopause 12/20/2021   Primary osteoarthritis of  left knee 06/20/2018   Psychophysiological insomnia 11/07/2022   Pure hypercholesterolemia 05/15/2013   Rectal abscess 05/10/2021   S/P left THA, AA 07/25/2016   S/P revision of total hip 04/10/2018   S/P tendon repair 01/23/2024   Sacroiliac inflammation 08/18/2021   Severe obstructive sleep apnea-hypopnea syndrome 12/22/2022   Shingles    Shortness of breath dyspnea    with exertion   Sicca syndrome 11/18/2018   (+) ANA   Sjogren syndrome 01/27/2019   Sjogren's syndrome 01/27/2019   Sleep walking and eating 03/06/2017   Snoring 03/06/2017   Spondylosis of lumbar region without myelopathy or radiculopathy 10/30/2011   Status post total hip replacement, left 09/20/2017   TFC (triangular fibrocartilage complex) injury 02/25/2019   Thyroid  nodule 08/11/2009   Findings: The thyroid  gland is within normal limits in size.  The gland is diffusely inhomogeneous. A small solid nodule is noted in the lower pole  medially on the right of 7 x 6 x 8 mm. A small solid nodule is noted inferiorly on the left of 3 x 3 x 4 mm.  IMPRESSION:  The thyroid  gland is within normal limits in size with only small solid nodules present, the largest of only 8 mm in diameter on the right.     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    Trochanteric bursitis, right hip 04/26/2020   Tubular adenoma of colon 01/28/2021   Colonoscopy screening 4 mm tubular Adenoma polyp Marilu Sol, MD [facility])   Tubular adenoma of colon 01/28/2021   Colonoscopy screening 4 mm tubular Adenoma polyp Marilu Sol, MD [facility])   Vasomotor symptoms due to menopause 04/19/2017   Vertigo 03/19/2023   Vitamin D  deficiency 05/08/2017   Vulvitis 07/22/2020   Yeast vaginitis 07/11/2019   Past Surgical History:  Procedure Laterality Date   arthroscopy Left 01/2022   with SAD and DCR, K. Supple MD   Bladder dilitation     x 3   BREAST BIOPSY Right 2011   Benign  histology   CARDIOVASCULAR STRESS TEST  2000   Unremarkable per pt report   CARPOMETACARPAL JOINT ARTHROTOMY Right 2011   COLONOSCOPY     COLONOSCOPY WITH PROPOFOL  N/A 04/21/2015   Procedure: COLONOSCOPY WITH PROPOFOL ;  Surgeon: Belvie Just, MD;  Location: [facility] ENDOSCOPY;  Service: Endoscopy;  Laterality: N/A;   CYSTOSCOPY W/ DILATION OF BLADDER N/A    EPIDURAL BLOCK INJECTION Left 04/12/2016   Left Medial Nerve Block and Left L5 ramus block, Dr Charlie Dolores    EPIDURAL BLOCK INJECTION  03/21/2016   Left L3-4 medial branch block and Left L5 & dorsal ramus block    EPIDURAL BLOCK INJECTION N/A 10/25/2016  Charlie Dolores, MD. Lumbar medial branch block   EPIDURAL BLOCK INJECTION N/A 02/09/2017   Charlie Dolores, MD.  Bilateral L3/4 medial branch block, bilateral L5 dorsal ramus block   EPIDURAL BLOCK INJECTION N/A 07/04/2017   Charlie Dolores, MD   EXCISION MORTON'S NEUROMA Right 05/03/2023   Procedure: EXCISION MORTON'S NEUROMA;  Surgeon: Kit Rush, MD;  Location: [facility];  Service: Orthopedics;  Laterality: Right;  general, local by surgeon 60 MIN   FECAL TRANSPLANT  04/21/2015   Procedure: FECAL TRANSPLANT;  Surgeon: Belvie Just, MD;  Location: [facility] ENDOSCOPY;  Service: Endoscopy;;   HIP ADDUCTOR TENOTOMY  01/2024   HIP ARTHROPLASTY Left    HIP ARTHROSCOPY Left 03/06/2018   Left hip arthroplasty, redo for loose hip arthroplasty. Procedure at [facility] hospital   INJECTION HIP INTRA ARTICULAR Left 11/2015   for OA by Dr Charlie Dolores REDBIRD IMPLANT PLACEMENT  04/2021   Rush Sharps MD ([facility])   INTERSTIM IMPLANT REVISION N/A 09/2021   Rush Sharps MD ([facility])   LIGAMENT REPAIR Right 05/03/2023   Procedure: COLLATERAL LIGAMENT REPAIR;  Surgeon: Kit Rush, MD;  Location: [facility];  Service: Orthopedics;  Laterality: Right;  general, local by surgeon 60 MIN   OTHER SURGICAL HISTORY Left 2016   Left L3/L4 medial  nerve block and Left L5 Dorsal Ramus block Dr FABIENE Dolores   TOTAL HIP ARTHROPLASTY Left 07/25/2016   Procedure: LEFT TOTAL HIP ARTHROPLASTY ANTERIOR APPROACH;  Surgeon: Donnice Car, MD;  Location: [facility];  Service: Orthopedics;  Laterality: Left;   WEIL OSTEOTOMY Right 05/03/2023   Procedure: RIGHT 2ND WEIL OSTEOTOMY;  Surgeon: Kit Rush, MD;  Location: [facility];  Service: Orthopedics;  Laterality: Right;  general, local by surgeon 60 MIN   Patient Active Problem List   Diagnosis Date Noted   Chronic pain of left upper extremity 02/19/2024   Arthritis of carpometacarpal [facility]) joint of left thumb 09/07/2023   BMI 26.0-26.9,adult 02/07/2023   Low serum vitamin B12 01/10/2023   OSA (obstructive sleep apnea) 12/27/2022   Severe obstructive sleep apnea-hypopnea syndrome 12/22/2022   Hyperglycemia 12/11/2022   Generalized obesity 12/11/2022   SOBOE (shortness of breath on exertion) 12/11/2022   Other fatigue 12/11/2022   Sleep related headaches 11/07/2022   Intractable episodic cluster headache 11/07/2022   Psychophysiological insomnia 11/07/2022   Hallux rigidus of right foot 09/25/2022   Chronic pain of right knee 05/08/2022   Arthrosis of hand 12/20/2021   History of Clostridioides difficile colitis, required fecal transplantation 12/20/2021   Meibomian gland dysfunction (MGD) of both eyes 10/17/2021   Nuclear sclerosis of both eyes 10/17/2021   Epiretinal membrane (ERM) of left eye 10/17/2021   OAB (overactive bladder) 03/02/2021   Osteoarthritis of acromioclavicular joint 02/22/2021   Hyperhidrosis, scalp, 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   Chronic low back pain 05/14/2019   Sjogren syndrome 01/27/2019   DDD (degenerative disc disease), cervical 12/27/2018   Cervical spondylosis 11/18/2018   DDD (degenerative disc disease), lumbar 11/18/2018   Keratoconjunctivitis sicca 11/18/2018   Chronic contact dermatitis 08/06/2018   Primary osteoarthritis of left knee 06/20/2018    Insulin  resistance 07/04/2017   Irritable bowel syndrome with diarrhea 04/03/2016   Left ventricular hypertrophy, mild 02/25/2016   Allergic rhinoconjunctivitis 07/02/2015   History of colonic polyps 07/01/2015   GERD (gastroesophageal reflux disease) 12/16/2014   Fibromyalgia 10/01/2013   Mood disorder 10/01/2013   Hyperlipidemia 10/01/2013   Pure hypercholesterolemia 05/15/2013  Chronic migraine without aura 05/15/2013   Benign essential hypertension 10/28/2012   Hypothyroidism 11/09/2011   Chronic interstitial cystitis 11/09/2011    PCP: McDiarmid, Krystal, MD REFERRING PROVIDER: Daniel Rush, PA-C REFERRING DIAG: s/p tendon repair Z 98.890  THERAPY DIAG:  Muscle weakness (generalized)  Other abnormalities of gait and mobility  Pain in right hip  Rationale for Evaluation and Treatment: Rehabilitation  ONSET DATE: 01/23/24   SUBJECTIVE:  SUBJECTIVE STATEMENT: Patient is continuing with hip soreness since the fall she had. She is sore today from walking.  PERTINENT HISTORY: HTN, hypercholesterolemia, fibromyalgia, migraines, arthritis, concussion PAIN:  Are you having pain? Yes: NPRS scale: 5/10 Pain location: Rt QL,low back Pain description: constant,ache Aggravating factors: sleeping, overuse  Relieving factors: none--mild discomfort  PRECAUTIONS: Other: The patient will be TDWB x 6 weeks, Abductor hip precautions x 6 weeks to the right side.  WEIGHT BEARING RESTRICTIONS: Yes initially TDWB x 6 weeks (she is beyond that per surgical date)  FALLS:  Has patient fallen in last 6 months? No  LIVING ENVIRONMENT: Lives with: lives with their spouse Lives in: House/apartment Stairs: Yes: Internal: 12-14 steps; on right going up Has following equipment at home: Single point cane  PATIENT GOALS: No pain, no cane  NEXT MD VISIT: early September  OBJECTIVE:  Note: Objective measures were completed at Evaluation unless otherwise noted.  PATIENT SURVEYS:  LEFS :  47.5%  COGNITION: Overall cognitive status: Within functional limits for tasks assessed     SENSATION: WFL  POSTURE: No Significant postural limitations  PALPATION: Mild soreness in lateral hip  LOWER EXTREMITY ROM: AROM to 90 degrees hip flexion Passive ROM Right eval Left eval Right 03/27/24 Right 04/07/24  Hip flexion 95  In quadriped 120 deg Can sit into child's pose  Hip extension      Hip abduction 35     Hip adduction      Hip internal rotation 30     Hip external rotation 30     Knee flexion      Knee extension      Ankle dorsiflexion      Ankle plantarflexion      Ankle inversion      Ankle eversion       (Blank rows = not tested)  LOWER EXTREMITY MMT: did not test due to post surgical status-- will assess as we initiate strengthening progression in PT MMT Right eval Left   Hip flexion 4/5 5/5  Hip extension 3+/5 4+/5  Hip abduction 4/5 *unable to tolerate laying on R side *detect weakness with h/o THR 2018  Hip adduction    Hip internal rotation 4/5 5/5  Hip external rotation 3+/5 3/5  Knee flexion 3+/5 4+/5  Knee extension 5/5 5/5  Ankle dorsiflexion 5/5 5/5  Ankle plantarflexion    Ankle inversion    Ankle eversion     (Blank rows = not tested)  BALANCE: R single limb x 2 seconds  L single limb x 10 seconds  GAIT: Distance walked: 150 ft Assistive device utilized: Single point cane and None Level of assistance: Modified independence Comments: 3.2 ft/sec-- patient does start off without a limp and after 100 ft has a R antalgic gait pattern *PT and patient discussed still needing the cane when outdoors and in the evening when fatigued  [facility] Adult PT Treatment:  DATE: 06/03/24 Therapeutic Exercise: Supine Hip hike/depression with ball under ankle Glut stretch IT Band stretch Sidelying Hip hiking/depression with ball under ankle Hip abduction x 10 reps Hip arcs ant/posterior + abduction x 10  reps Manual Therapy: STM with massage ball IASTM R IT band Self Care: LEFS completed Discussed on hold plan  [facility] Adult PT Treatment:                                                DATE: 05/28/24 Therapeutic Exercise: Glute stretch 2 x 20 sec IT band stretch 2 x 20 sec   Stool IR/ER x 10  Neuromuscular re-ed: Lateral step ups on airex 2 x 10  Clamshell 2 x 10  Supine TA march 2 x 10  Therapeutic Activity: Step up knee driver x 10; 6 inch step  Walking in clinic with purposeful turning   Self Care: Walking program   [facility] Adult PT Treatment:                                                DATE: 05/21/24 Therapeutic Exercise: Supine Trunk rotation lumbar  Bridges with toes raised for HS engagement Standing Step ups R and L x 10 reps dec'ing UE support Manual Therapy: STM in sidelying for R glut medius with IASTM IASTM/STM along IT band sidelying Self release using massage ball at the wall Neuromuscular re-ed: Single limb stance focused on hip stabilization and keeping pelvis level R and L LE Therapeutic Activity: Lunge isometric holds with counter rotation High lunge with isometric holds Leg press with blue band at thights 65# x 15 reps, then moved up to 70# x 12 reps-- fatigue after rising from machine   PATIENT EDUCATION:  Education details: HEP update Person educated: Patient Education method: Programmer, Multimedia, demo, cues, handout Education comprehension: verbalized understanding, returned demo, cues   HOME EXERCISE PROGRAM: Access Code: [facility] URL: [email or web address] Date: 05/28/2024 Prepared by: Lucie Meeter  Exercises - Side Stepping with Resistance at Ankles  - 1 x daily - 3-4 x weekly - 1 sets - 10 reps - Standing Hip Abduction with Resistance at Ankles and Counter Support  - 1 x daily - 3-4 x weekly - 2 sets - 10 reps - Standing Hip Extension with Resistance at Ankles and Counter Support  - 1 x daily - 3-4 x weekly - 2 sets - 10 reps -  Standing Hamstring Curl with Resistance  - 1 x daily - 3-4 x weekly - 2 sets - 10 reps - Standing Quadratus Lumborum Stretch with Doorway  - 1 x daily - 3-4 x weekly - 1 sets - 3 reps - 20 seconds hold - Supine Active Straight Leg Raise  - 1 x daily - 3-4 x weekly - 1 sets - 10 reps - Sidelying Hip Abduction  - 1 x daily - 3-4 x weekly - 1 sets - 10 reps - Prone Hip Extension  - 1 x daily - 3-4 x weekly - 2 sets - 10 reps - Seated Hip External Rotation AROM  - 1 x daily - 3-4 x weekly - 1 sets - 10 reps - Single Leg Stance with Support  - 1 x daily - 7 x weekly - 3 sets -  max  hold - Lunge with Resistance Rotation Pull  - 1 x daily - 4 x weekly - 1 sets - 10 reps - Supine Gluteus Stretch  - 1 x daily - 7 x weekly - 2 sets - 20 sec hold  Patient Education - walking program   ASSESSMENT: CLINICAL IMPRESSION: The patient is continuing to get mild pain and muscle catching in the R hip since she fell a few weeks ago. She improves in session with IT Band soft tissue release. PT recommends continued HEP and progression of walking within tolerance. Patient to be on hold and call us  in the next month if she needs to return due to continued pain.   EVAL: Patient is a 68 y.o. female who was seen today for physical therapy evaluation and treatment s/p R glut med repair. She is 6.5 weeks post op today and cleared for WBAT and to begin strengthening. PT initiated HEP today for gentle ROM, glut activations, and to also address R QL pain. Patient feels that cane use is flaring other pain including L 1st MCP joint, L shoulder, and R QL. PT discussed the rationale for cane use discussing goal to normalize gait and avoid an antalgic pattern, therefore, patient to use when in the community and in the evening if she is fatigued. PT to address deficits and promote return to prior functional status.   OBJECTIVE IMPAIRMENTS: Abnormal gait, decreased activity tolerance, decreased balance, decreased mobility,  difficulty walking, decreased ROM, decreased strength, increased fascial restrictions, impaired flexibility, and pain.   GOALS: Goals reviewed with patient? Yes  SHORT TERM GOALS: Target date: 04/16/24  The patient will be indep with initial HEP Baseline: initiated at eval Goal status:MET  2.  The patient will improve LEFS by 8% to demonstrate improved functional abilities. Baseline:  47.5% Goal status: MET= at 62.5% on 8/4  3.   The patient will  demonstrate normal gait pattern with no evidence of antalgic pattern x 500 ft to d/c cane for community distances. Baseline:  Gets antalgic pattern after 100 ft in clinic Goal status: PARTIALLY MET  4.  The patient will maintain single leg standing R side x 8 seconds to demo good hip stability for functional loading. Baseline:  2 seconds, L is 10 seconds. Goal status: MET on 03/27/24 10 seconds each side  LONG TERM GOALS: Target date: 05/17/24  The patient will be indep with progression of HEP. Baseline:  initiated at eval Goal status: MET  2.  The patient will improve LEFS by 15% to demonstrate improved functional abilities. Baseline: 47.5% Goal status: REVISED TO NEW LTG DATE -- has new onset pain due to recent slip/fall in home.  3.  The patient will report no pain in R quadratus lumborum region demonstrating reduced compensatory strategies with gait. Baseline:  Is using QL for hip hike and has pain at eval Goal status: PARTIALLY MET-- has mild pain in R QL during standing hip abduction (LLE)-- when stabilizing with R LE  4.  The patient will be able to negotiate 12 steps with one rail and reciprocal pattern mod indep. Baseline:  has done steps 1x since d/c home Goal status: MET  5.  The patient will demo improved R LE strength demonstrating SLR x 10 reps. Baseline:   to assess MMT Goal status: MET  6. The patient will have negative positional testing indicating resolution of BPPV.  Baseline: + R BPPV  Goal status: MET     UPDATED GOALS: LONG TERM GOALS:  Target date: 06/18/24  The patient will be indep with progression of HEP. Baseline:  to progress Goal status: MEt  2.  The patient will improve LEFS by 15% to demonstrate improved functional abilities. Baseline: 47.5% Goal status: 67.5%  on 06/03/24 MET  3.  The patient will report no pain in R quadratus lumborum region demonstrating reduced compensatory strategies with gait. Baseline:  Is using QL for hip hike and has pain at eval Goal status: MET  4.  The patient will demonstrate standing L hip abduction without compensatory R leaning. Baseline:  demonstrates trunk lateral lean Goal status: MET PLAN:  PT FREQUENCY1x/week  PT DURATION: 4 weeks  PLANNED INTERVENTIONS: 97164- PT Re-evaluation, 97750- Physical Performance Testing, 97110-Therapeutic exercises, 97530- Therapeutic activity, [id number] Neuromuscular re-education, 97535- Self Care, [id number]- Manual therapy, [id number]- Gait training, [phone]- Aquatic Therapy, [phone]- Electrical stimulation (unattended), [phone] (1-2 muscles), [phone] (3+ muscles)- Dry Needling, Patient/Family education, Balance training, Stair training, Taping, Joint mobilization, Cryotherapy, and Moist heat  PLAN FOR NEXT SESSION: on hold, continue HEP progression and return if pain continues.   Viviana Trimble, PT 06/03/24 3:40 PM  "

## 2024-06-04 NOTE — Progress Notes (Signed)
 I agree with [name], NP, on a CPAP therapy pause and observation based on the last data we obtained.  CD

## 2024-06-05 ENCOUNTER — Ambulatory Visit (INDEPENDENT_AMBULATORY_CARE_PROVIDER_SITE_OTHER): Admitting: Family Medicine

## 2024-06-05 ENCOUNTER — Encounter (INDEPENDENT_AMBULATORY_CARE_PROVIDER_SITE_OTHER): Payer: Self-pay | Admitting: Family Medicine

## 2024-06-05 ENCOUNTER — Other Ambulatory Visit: Payer: Self-pay

## 2024-06-05 ENCOUNTER — Other Ambulatory Visit (HOSPITAL_COMMUNITY): Payer: Self-pay

## 2024-06-05 VITALS — BP 105/76 | HR 86 | Temp 98.3°F | Ht 66.0 in | Wt 164.0 lb

## 2024-06-05 DIAGNOSIS — E669 Obesity, unspecified: Secondary | ICD-10-CM | POA: Diagnosis not present

## 2024-06-05 DIAGNOSIS — G4733 Obstructive sleep apnea (adult) (pediatric): Secondary | ICD-10-CM | POA: Diagnosis not present

## 2024-06-05 DIAGNOSIS — Z6826 Body mass index (BMI) 26.0-26.9, adult: Secondary | ICD-10-CM

## 2024-06-05 DIAGNOSIS — E559 Vitamin D deficiency, unspecified: Secondary | ICD-10-CM

## 2024-06-05 DIAGNOSIS — I10 Essential (primary) hypertension: Secondary | ICD-10-CM

## 2024-06-05 DIAGNOSIS — E039 Hypothyroidism, unspecified: Secondary | ICD-10-CM

## 2024-06-05 DIAGNOSIS — E88819 Insulin resistance, unspecified: Secondary | ICD-10-CM

## 2024-06-05 MED ORDER — TIRZEPATIDE-WEIGHT MANAGEMENT 7.5 MG/0.5ML ~~LOC~~ SOAJ
7.5000 mg | SUBCUTANEOUS | 0 refills | Status: DC
  Filled 2024-06-05: qty 2, 28d supply, fill #0

## 2024-06-06 ENCOUNTER — Other Ambulatory Visit (HOSPITAL_COMMUNITY): Payer: Self-pay

## 2024-06-06 ENCOUNTER — Ambulatory Visit

## 2024-06-09 ENCOUNTER — Encounter (INDEPENDENT_AMBULATORY_CARE_PROVIDER_SITE_OTHER): Payer: Self-pay

## 2024-06-09 DIAGNOSIS — Z719 Counseling, unspecified: Secondary | ICD-10-CM

## 2024-06-09 NOTE — Progress Notes (Signed)
 Office: [phone]  /  Fax: [phone]   Chief Complaint: OBESITY   History of Present Illness Molly Giles is a 68 year old female with obesity and obstructive sleep apnea who presents for a follow-up on her weight management and sleep apnea treatment.  She has been following a category one eating plan but adheres to it only about 25% of the time. Her exercise routine includes 20 minutes of cardio and strengthening exercises four days a week. Despite these efforts, her weight has remained stable over the last month since her last visit.  She is currently being treated for obesity with Zepbound  at a dose of 0.5 mg. There has been no change in her symptoms, and she has used two boxes of the medication, administered via pens.  Her weight management plan initially included a dose of 0.25 mg of medication, which was effective, but she has since experienced a plateau in her weight loss. She attributes this to possible metabolic adaptation or dietary factors, such as increased intake of simple carbohydrates during a recent birthday celebration. Her portion control and food choices have become more relaxed, assuming the medication would compensate.  She has a history of keeping a food journal and notes that her intake was very restrictive at the beginning of her plan, with a caloric intake of 1200 calories plus a 100-calorie snack. She is concerned about not having gone down a bra size despite weight management efforts.  Socially, she has a new dog and has not boarded a dog in the last 15 years, relying on friends to stay with her.  Additionally, she discusses a friend with esophageal cancer, which has given her perspective on her own health challenges.      PHYSICAL EXAM:  Blood pressure 105/76, pulse 86, temperature 98.3 F (36.8 C), height 5' 6 (1.676 m), weight 164 lb (74.4 kg), SpO2 98%. Body mass index is 26.47 kg/m.  DIAGNOSTIC DATA REVIEWED:  BMET    Component Value  Date/Time   NA 144 09/10/2023 0731   K 4.2 09/10/2023 0731   CL 107 (H) 09/10/2023 0731   CO2 22 09/10/2023 0731   GLUCOSE 79 09/10/2023 0731   GLUCOSE 93 07/05/2019 1629   BUN 20 09/10/2023 0731   CREATININE 0.71 09/10/2023 0731   CREATININE 0.74 02/24/2016 1158   CALCIUM  9.3 09/10/2023 0731   GFRNONAA 65 07/24/2019 1116   GFRNONAA 88 02/24/2016 1158   GFRAA 75 07/24/2019 1116   GFRAA >89 02/24/2016 1158   Lab Results  Component Value Date   HGBA1C 5.2 09/10/2023   HGBA1C 5.3 03/22/2017   Lab Results  Component Value Date   INSULIN  8.0 09/10/2023   INSULIN  10.5 03/22/2017   Lab Results  Component Value Date   TSH 2.290 10/18/2023   CBC    Component Value Date/Time   WBC 6.4 08/04/2020 0000   WBC 6.6 07/05/2019 1629   RBC 4.26 08/04/2020 0000   HGB 13.5 08/04/2020 0000   HGB 12.6 05/07/2018 1057   HCT 41 08/04/2020 0000   HCT 38.8 05/07/2018 1057   PLT 262 08/04/2020 0000   MCV 95.0 07/05/2019 1629   MCV 90 05/07/2018 1057   MCH 30.0 07/05/2019 1629   MCHC 31.6 07/05/2019 1629   RDW 13.8 07/05/2019 1629   RDW 14.0 05/07/2018 1057   Iron  Studies No results found for: IRON , TIBC, FERRITIN, IRONPCTSAT Lipid Panel     Component Value Date/Time   CHOL 166 09/10/2023 0731   TRIG 95  09/10/2023 0731   HDL 51 09/10/2023 0731   CHOLHDL 3.3 10/11/2021 1142   CHOLHDL 3.9 12/14/2014 1036   VLDL 26 12/14/2014 1036   LDLCALC 97 09/10/2023 0731   Hepatic Function Panel     Component Value Date/Time   PROT 6.6 09/10/2023 0731   ALBUMIN 4.3 09/10/2023 0731   AST 21 09/10/2023 0731   ALT 21 09/10/2023 0731   ALKPHOS 84 09/10/2023 0731   BILITOT 0.3 09/10/2023 0731   BILIDIR 0.14 02/12/2020 1056      Component Value Date/Time   TSH 2.290 10/18/2023 1212   Nutritional Lab Results  Component Value Date   VD25OH 103.0 (H) 09/10/2023   VD25OH 41.2 12/11/2022   VD25OH 43.3 05/07/2018     Assessment and Plan Assessment & Plan Obesity Obesity  management is ongoing. She adheres to a category one eating plan 25% of the time and exercises 20 minutes, four days a week, focusing on cardio with some strengthening. Weight has been maintained over the last month. She was on a 5 mg dose of Zepbound  without change, indicating a need for dose adjustment. Correct nutrition and portion control are crucial given her current BMI. She is reluctant to count calories but acknowledges the need to monitor intake. Recommended protein intake is 75-100 grams per day, emphasizing that excess protein is not harmful if calorie intake is controlled. - Increase Zepbound  dose to 7.5 - Send prescription to [facility] - Encourage continuation of exercise regimen - Advise monitoring of calorie intake and portion control - Recommend protein intake of 75-100 grams per day - Order labs to assess vitamin D , insulin  resistance, thyroid  function, cholesterol, B12, and red and white cell counts  Obstructive sleep apnea Obstructive sleep apnea is being managed with Zepbound . - Continue diet, exercise and weight loss as discussed today as an important part of the treatment plan RF Zepbound      Molly Giles was informed of the importance of frequent follow up visits to maximize her success with intensive lifestyle modifications for her obesity and obesity related health conditions as recommended by USPSTF and CMS guidelines   Louann Penton, MD

## 2024-06-12 ENCOUNTER — Ambulatory Visit
Admission: RE | Admit: 2024-06-12 | Discharge: 2024-06-12 | Disposition: A | Source: Ambulatory Visit | Attending: Family Medicine | Admitting: Family Medicine

## 2024-06-12 DIAGNOSIS — Z1231 Encounter for screening mammogram for malignant neoplasm of breast: Secondary | ICD-10-CM

## 2024-06-13 ENCOUNTER — Other Ambulatory Visit: Payer: Self-pay | Admitting: Plastic Surgery

## 2024-06-23 ENCOUNTER — Other Ambulatory Visit: Payer: Self-pay | Admitting: Family Medicine

## 2024-06-24 ENCOUNTER — Other Ambulatory Visit: Payer: Self-pay | Admitting: Family Medicine

## 2024-06-25 ENCOUNTER — Ambulatory Visit: Attending: Orthopedic Surgery | Admitting: Physical Therapy

## 2024-06-25 ENCOUNTER — Other Ambulatory Visit: Payer: Self-pay

## 2024-06-25 ENCOUNTER — Encounter: Payer: Self-pay | Admitting: Physical Therapy

## 2024-06-25 DIAGNOSIS — M6281 Muscle weakness (generalized): Secondary | ICD-10-CM | POA: Diagnosis present

## 2024-06-25 DIAGNOSIS — G8929 Other chronic pain: Secondary | ICD-10-CM | POA: Diagnosis present

## 2024-06-25 DIAGNOSIS — M25512 Pain in left shoulder: Secondary | ICD-10-CM | POA: Diagnosis present

## 2024-06-25 DIAGNOSIS — M542 Cervicalgia: Secondary | ICD-10-CM | POA: Insufficient documentation

## 2024-06-25 DIAGNOSIS — G549 Nerve root and plexus disorder, unspecified: Secondary | ICD-10-CM | POA: Insufficient documentation

## 2024-06-25 NOTE — Therapy (Signed)
 OUTPATIENT PHYSICAL THERAPY CERVICAL/SHOULDER EVALUATION   Patient Name: Molly Giles MRN: [id number] DOB:1956-02-22, 68 y.o., female Today's Date: 06/25/2024  END OF SESSION:  PT End of Session - 06/25/24 1243     Visit Number 1    Date for Recertification  08/20/24    Authorization Type medicare and BCBS supplemental    Progress Note Due on Visit 10    PT Start Time 1237    PT Stop Time 1315    PT Time Calculation (min) 38 min    Activity Tolerance Patient tolerated treatment well    Behavior During Therapy [facility] for tasks assessed/performed          Past Medical History:  Diagnosis Date   Abdominal bloating 05/10/2021   Abdominal discomfort, generalized 05/07/2016   Acquired hallux rigidus of right foot 09/15/2022   Acute gastritis 11/19/2020   Allergic rhinoconjunctivitis 07/02/2015   Anal fissure 11/19/2020   Anterior to posterior tear of superior glenoid labrum of left shoulder 02/22/2021   Arthritis    Arthritis of carpometacarpal (CMC) joint of left thumb 09/07/2023   Aseptic loosening of prosthetic hip, initial encounter 11/29/2017   Benign essential hypertension 10/28/2012   Benign positional vertigo 04/2015   Responded well to [facility]   Bruxism (teeth grinding)    Cervical spondylosis 11/18/2018   Chronic contact dermatitis 08/06/2018   Per allergist Dr Almarie Scala.    Chronic interstitial cystitis 11/09/2011   Managed by [facility]rology branch in [place] hydrodistention 5 years ago with Dr Claudene in [place], has relief of symptoms until now     Chronic low back pain 05/14/2019   Chronic migraine 02/25/2016   Per Dr Ines review in notes from [facility] from September 2015. Showed total headache days last month 18. Severe headache days 7 days. Moderate headache days 5 days. Mild headache days last month sick days. Days without headache last month 10 days. Symptoms associated with photophobia, phonophobia, osmophobia, neck pain,  dizziness, jaw pain, nasal congestion, vision disturbances, tingling and numbness, weakness and worsening with activity. Each headache attack last 3 hours depending on treatment in severity. Left side, the right side, easier side, the frontal area in the back of the head. Characterized as throbbing, pressure, tightness, squeezing, stabbing and burning    Chronic migraine without aura 05/15/2013    Dr Ines [facility]   Chronic tonsillitis 01/27/2019   DDD (degenerative disc disease), cervical 11/18/2018   DDD (degenerative disc disease), lumbar    Depression    Disc displacement, lumbar    Dysphagia 10/11/2021   Dysrhythmia    seen by dr Blanca- not a problem since she has been on Bystolic    Epiretinal membrane (ERM) of left eye 10/17/2021   Periodic ophthalmological monitoring     Episodic cluster headache, not intractable 03/06/2017   Essential hypertension 05/15/2013   Family history of adverse reaction to anesthesia    Brother- N/V   Family history of premature CAD 05/15/2013   Fibromyalgia 10/01/2013   Management by Dr GORMAN. Devashwar ([facility]he[facility])     Fibromyalgia syndrome 07/01/2015   Management by Dr GORMAN. Devashwar ([facility])    GERD (gastroesophageal reflux disease) 12/16/2014   H/O seasonal allergies    Hallux rigidus of right foot 09/25/2022   Hammer toe    Left great toe   Hashimoto's thyroiditis    Per patient, diagnosed by Dr. Sharyne Pacini   Hearing loss of both ears 07/27/2015   mild to borderline moderate low  frequency hearing loss improving to within normal limits bilaterally on audiology testing at [facility] in November 2016.     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   Hyperlipidemia 1998   Hypokalemia due to excessive gastrointestinal loss of potassium 07/28/2019   Hypothyroidism    Iliopsoas bursitis of left hip 05/08/2022   Impairment of balance 02/2015   Consequent of postconcussive syndrome   Injury of triangular fibrocartilage complex (TFCC) 02/25/2019   Injury of triangular fibrocartilage complex of left wrist 02/25/2019   Dx 02/25/19 Prentice Pagan IV MD ([facility])   Insulin  resistance 07/04/2017   Internal hemorrhoid 01/28/2021   Internal hemorrhoid seen on colonoscopy 10/2020 REGINOLD Sol MD [facility])   Interstitial cystitis    Irritable bowel syndrome with diarrhea 04/03/2016   Keratoconjunctivitis sicca 11/18/2018   (+) ANA     Left ventricular hypertrophy, mild 02/25/2016   ECHOcardiogram report 06/17/15 showing EF55-60%, mild LVH and G1DD    Loose total hip arthroplasty 03/10/2018   [facility]   Low serum vitamin B12 01/10/2023   Lumbar facet joint pain    Meibomian gland dysfunction (MGD) of both eyes 10/17/2021   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   Metatarsalgia of both feet 07/31/2022   Mood disorder    Morbid obesity (HCC) 03/06/2017   Morton's neuroma of right foot 09/15/2022   Musculoskeletal neck pain 07/14/2015   Nocturnal hypoxemia 03/06/2017   Normal coronary arteries 05/14/2014   Nuclear sclerosis of both eyes 10/17/2021   OAB (overactive bladder) 03/02/2021   Formatting of this note might be different from the original.  Added automatically from request for surgery [id number]     Formatting of this note might be different from the original.  Added automatically from request for surgery [id number]     Obesity (BMI 30.0-34.9) 01/29/2017   Odynophagia 12/20/2021   OSA (obstructive sleep apnea) 12/27/2022   Dr Dohmeier polysomnography titration study 04/27/24  There was no clinically significant apnea noted  under 5 cm water  CPAP pressure with an AHI of 0.7/h.  What was present was hypoxia- not severe, and loud snoring.   CMS AHI was 0/h under 6 cm water  CPAP which reduced the snoring somewhat and reduced hypoxia. Higher CPAP pressures induced more apneas and hypopneas.  The weight loss has reduced the   Osteoarthritis of acromioclavicular joint 02/22/2021   Osteoarthritis of left hip 11/01/2015   MRI order by Dr Ernie (ortho) 10/2015 showed significant arthritis of left hip joint with cystic changes in femoral head c/w osteoarthritis   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, lumbar region 10/30/2011   Other insomnia 11/09/2016   Overweight    Pain in joint of left shoulder 11/09/2016   Pain in joint, multiple sites 11/18/2018   Palpitations 05/15/2013    Cardiology manages   Perianal candidiasis 12/20/2021   Periodic limb movement sleep disorder 03/28/2017   Perirectal cyst 05/07/2016   Plantar fasciitis of right foot 11/10/2022   Poison ivy dermatitis 03/24/2021   PONV (postoperative nausea and vomiting)    Positive ANA (antinuclear antibody) 11/18/2018   Post concussion syndrome 06/06/2015   Post  concussive syndrome 07/14/2015   Ms Walrath's post-concussive syndrome manifesting in vertigo and headache, mood changes, poor balance, dizziness, and decreased concentration per Dr Ines at [facility].    Posterior vitreous detachment of right eye 2014   Premature menopause 12/20/2021   Primary osteoarthritis of left knee 06/20/2018   Psychophysiological insomnia 11/07/2022   Pure hypercholesterolemia 05/15/2013   Rectal abscess 05/10/2021   S/P left THA, AA 07/25/2016   S/P revision of total hip 04/10/2018   S/P tendon repair 01/23/2024   Sacroiliac inflammation 08/18/2021   Severe obstructive sleep apnea-hypopnea syndrome 12/22/2022   Shingles    Shortness of breath dyspnea    with exertion   Sicca syndrome 11/18/2018   (+) ANA   Sjogren syndrome  01/27/2019   Sjogren's syndrome 01/27/2019   Sleep walking and eating 03/06/2017   Snoring 03/06/2017   Spondylosis of lumbar region without myelopathy or radiculopathy 10/30/2011   Status post total hip replacement, left 09/20/2017   TFC (triangular fibrocartilage complex) injury 02/25/2019   Thyroid  nodule 08/11/2009   Findings: The thyroid  gland is within normal limits in size.  The gland is diffusely inhomogeneous. A small solid nodule is noted in the lower pole  medially on the right of 7 x 6 x 8 mm. A small solid nodule is noted inferiorly on the left of 3 x 3 x 4 mm.  IMPRESSION:  The thyroid  gland is within normal limits in size with only small solid nodules present, the largest of only 8 mm in diameter on the right.     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    Trochanteric bursitis, right hip 04/26/2020   Tubular adenoma of colon 01/28/2021   Colonoscopy screening 4 mm tubular Adenoma polyp Marilu Sol, MD [facility])   Tubular adenoma of colon 01/28/2021   Colonoscopy screening 4 mm tubular Adenoma polyp Marilu Sol, MD [facility])   Vasomotor symptoms due to menopause 04/19/2017   Vertigo 03/19/2023   Vitamin D  deficiency 05/08/2017   Vulvitis 07/22/2020   Yeast vaginitis 07/11/2019   Past Surgical History:  Procedure Laterality Date   arthroscopy Left 01/2022   with SAD and DCR, K. Supple MD   Bladder dilitation     x 3   BREAST BIOPSY Right 2011   Benign histology   CARDIOVASCULAR STRESS TEST  2000   Unremarkable per pt report   CARPOMETACARPAL JOINT ARTHROTOMY Right 2011   COLONOSCOPY     COLONOSCOPY WITH PROPOFOL  N/A 04/21/2015   Procedure: COLONOSCOPY WITH PROPOFOL ;  Surgeon: Belvie Just, MD;  [facility] END[facility]COPY;  Service: Endoscopy;  Laterality: N/A;   CYSTOSCOPY W/ DILATION OF BLADDER N/A    EPIDURAL BLOCK INJECTION Left 04/12/2016   Left Medial Nerve Block and Left L5 ramus block, Dr  Charlie Dolores    EPIDURAL BLOCK INJECTION  03/21/2016   Left L3-4 medial branch block and Left L5 & dorsal ramus block    EPIDURAL BLOCK INJECTION N/A 10/25/2016   Charlie Dolores, MD. Lumbar medial branch block   EPIDURAL BLOCK INJECTION N/A 02/09/2017   Charlie Dolores, MD.  Bilateral L3/4 medial branch block, bilateral L5 dorsal ramus block   EPIDURAL BLOCK INJECTION N/A 07/04/2017   Charlie Dolores, MD   EXCISION MORTON'S NEUROMA Right 05/03/2023   Procedure: EXCISION MORTON'S NEUROMA;  Surgeon: Kit Rush, MD;  Location: [facility];  Service: Orthopedics;  Laterality: Right;  general, local by surgeon 60  MIN   FECAL TRANSPLANT  04/21/2015   Procedure: FECAL TRANSPLANT;  Surgeon: Belvie Just, MD;  Location: [facility] END[facility]COPY;  Service: Endoscopy;;   HIP ADDUCTOR TENOTOMY  01/2024   HIP ARTHROPLASTY Left    HIP ARTHROSCOPY Left 03/06/2018   Left hip arthroplasty, redo for loose hip arthroplasty. Procedure at [facility] hospital   INJECTION HIP INTRA ARTICULAR Left 11/2015   for OA by Dr Charlie Bonner REDBIRD IMPLANT PLACEMENT  04/2021   Norleen Sharps MD ([facility])   INTERSTIM IMPLANT REVISION N/A 09/2021   Norleen Sharps MD ([facility])   LIGAMENT REPAIR Right 05/03/2023   Procedure: COLLATERAL LIGAMENT REPAIR;  Surgeon: Kit Norleen, MD;  Location: [facility];  Service: Orthopedics;  Laterality: Right;  general, local by surgeon 60 MIN   OTHER SURGICAL HISTORY Left 2016   Left L3/L4 medial nerve block and Left L5 Dorsal Ramus block Dr FABIENE Bonner   TOTAL HIP ARTHROPLASTY Left 07/25/2016   Procedure: LEFT TOTAL HIP ARTHROPLASTY ANTERIOR APPROACH;  Surgeon: Donnice Car, MD;  Location: [facility];  Service: Orthopedics;  Laterality: Left;   WEIL OSTEOTOMY Right 05/03/2023   Procedure: RIGHT 2ND WEIL OSTEOTOMY;  Surgeon: Kit Norleen, MD;  Location: [facility];  Service: Orthopedics;  Laterality: Right;  general, local by  surgeon 60 MIN   Patient Active Problem List   Diagnosis Date Noted   Chronic pain of left upper extremity 02/19/2024   Arthritis of carpometacarpal [facility]) joint of left thumb 09/07/2023   BMI 26.0-26.9,adult 02/07/2023   Low serum vitamin B12 01/10/2023   OSA (obstructive sleep apnea) 12/27/2022   Severe obstructive sleep apnea-hypopnea syndrome 12/22/2022   Hyperglycemia 12/11/2022   Generalized obesity 12/11/2022   SOBOE (shortness of breath on exertion) 12/11/2022   Other fatigue 12/11/2022   Sleep related headaches 11/07/2022   Intractable episodic cluster headache 11/07/2022   Psychophysiological insomnia 11/07/2022   Hallux rigidus of right foot 09/25/2022   Chronic pain of right knee 05/08/2022   Arthrosis of hand 12/20/2021   History of Clostridioides difficile colitis, required fecal transplantation 12/20/2021   Meibomian gland dysfunction (MGD) of both eyes 10/17/2021   Nuclear sclerosis of both eyes 10/17/2021   Epiretinal membrane (ERM) of left eye 10/17/2021   OAB (overactive bladder) 03/02/2021   Osteoarthritis of acromioclavicular joint 02/22/2021   Hyperhidrosis, scalp, 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   Chronic low back pain 05/14/2019   Sjogren syndrome 01/27/2019   DDD (degenerative disc disease), cervical 12/27/2018   Cervical spondylosis 11/18/2018   DDD (degenerative disc disease), lumbar 11/18/2018   Keratoconjunctivitis sicca 11/18/2018   Chronic contact dermatitis 08/06/2018   Primary osteoarthritis of left knee 06/20/2018   Insulin  resistance 07/04/2017   Irritable bowel syndrome with diarrhea 04/03/2016   Left ventricular hypertrophy, mild 02/25/2016   Allergic rhinoconjunctivitis 07/02/2015   History of colonic polyps 07/01/2015   GERD (gastroesophageal reflux disease) 12/16/2014   Fibromyalgia 10/01/2013   Mood disorder 10/01/2013   Hyperlipidemia 10/01/2013   Pure hypercholesterolemia 05/15/2013   Chronic migraine without aura 05/15/2013   Benign  essential hypertension 10/28/2012   Hypothyroidism 11/09/2011   Chronic interstitial cystitis 11/09/2011    PCP: McDiarmid, Krystal MD  REFERRING PROVIDER:Supple, Franky MD  REFERRING DIAG: M25  THERAPY DIAG:  Cervicalgia  Chronic left shoulder pain  Muscle weakness (generalized)  Rationale for Evaluation and Treatment: Rehabilitation  ONSET DATE: chronic issue, worsened recently during and following surgery for hip  SUBJECTIVE:  SUBJECTIVE STATEMENT: Long term issue with left sided neck pain extending down upper trap to lateral upper arm and shoulder blade region; states she has had it a long time but needed to recover and rehab her hip first before switching gears to this; migraines seem to be tied to this as well (triggers include scents and light) Hand dominance: Right  PERTINENT HISTORY:  History of PT for right hip pain and glute med tendon repair HTN, hypercholesterolemia, fibromyalgia (diagnosed decades ago), migraines usually on right side 3-4x/week gets an infusion every few months, arthritis, concussion  Left thumb bone spur Bout of vertigo 2024 summer Has 2# weights and red band but doesn't use, has to be careful to avoid flare up  PAIN:   Are you having pain? Yes NPRS scale: 5/10 Pain location: left neck, upper arm, medial scapula Pain orientation: Left  PAIN TYPE: aching, burning, and throbbing Pain description: constant  Aggravating factors: stiff with driving, looking up, moving head, raising left arm Relieving factors: minimizing activity level, migraine med   PRECAUTIONS: Other: limit volume of ex secondary to fibromyalgia   WEIGHT BEARING RESTRICTIONS: No  FALLS:  Has patient fallen in last 6 months? No  PLOF: Independent  PATIENT GOALS: stop that  nerve irritation; lower the pain  NEXT MD VISIT: after PT  OBJECTIVE:  Note: Objective measures were completed at Evaluation unless otherwise noted.  DIAGNOSTIC FINDINGS:  Mild; FINDINGS: Alignment: Normal. There is no significant antero or retrolisthesis. There is no jumped or perched facet.   Skull base and vertebrae: Skull base alignment is maintained. Vertebral body heights are preserved. There is no evidence of acute injury. There is no suspicious osseous lesion.   Soft tissues and spinal canal: No prevertebral fluid or swelling. No visible canal hematoma.   Disc levels: There is mild intervertebral disc space narrowing with mild associated uncovertebral arthropathy at C5-C6. There is overall minimal facet arthropathy throughout the cervical spine. There is up to mild left neural foraminal stenosis at C5-C6. There is no other significant spinal canal or neural foraminal stenosis.   Upper chest: The imaged lung apices are clear.   Other: None.   IMPRESSION: Minimal degenerative changes at C5-C6 resulting in mild left neural foraminal stenosis. Otherwise, unremarkable cervical spine CT with no other significant spinal canal or neural foraminal stenosis.    PATIENT SURVEYS:  NDI:  NECK DISABILITY INDEX  Date: 10/15 Score  Pain intensity 3 = The pain is fairly severe at the moment  2. Personal care (washing, dressing, etc.) 0 = I can look after myself normally without causing extra pain  3. Lifting 3 = Pain prevents me from lifting heavy weights but I can manage light to medium   weights if they are conveniently positioned  4. Reading 2 =  I can read as much as I want with moderate pain in my neck  5. Headaches 3 = I have moderate headaches, which come frequently  6. Concentration 1 =  I can concentrate fully when I want to with slight difficulty   7. Work 3 =  I cannot do my usual work  8. Driving 2 =  I can drive my car as long as I want with moderate pain in my neck   9. Sleeping 1 = My sleep is slightly disturbed (less than 1 hr sleepless)  10. Recreation 1 =  I am able to engage in all my recreation activities, with some pain in my neck  Total 38%  Minimum Detectable Change (90% confidence): 5 points or 10% points  COGNITION: Overall cognitive status: Within functional limits for tasks assessed  PALPATION: Tender points left upper traps, left paraspinals, bil suboccipitals   CERVICAL ROM:   Active ROM A/PROM (deg) eval  Flexion 55  Extension 45 pain  Right lateral flexion 42 pain  Left lateral flexion 32  Right rotation 30  Left rotation 30   (Blank rows = not tested)  UPPER EXTREMITY ROM:  Active ROM Right eval Left eval  Shoulder flexion 155 150 pain  Shoulder extension    Shoulder abduction 165 160 pain  Shoulder adduction    Shoulder extension    Shoulder internal rotation T2 T8  Shoulder external rotation C7 C7  Elbow flexion    Elbow extension    Wrist flexion    Wrist extension    Wrist ulnar deviation    Wrist radial deviation    Wrist pronation    Wrist supination     (Blank rows = not tested)  UPPER EXTREMITY MMT:  MMT Right eval Left eval  Shoulder flexion 5 4-  Shoulder extension    Shoulder abduction 5 4-  Shoulder adduction    Shoulder extension    Shoulder internal rotation 5 4  Shoulder external rotation 5 4  Middle trapezius 4 4-  Lower trapezius 4 4-  Elbow flexion    Elbow extension    Wrist flexion    Wrist extension    Wrist ulnar deviation    Wrist radial deviation    Wrist pronation    Wrist supination    Grip strength     (Blank rows = not tested)  CERVICAL SPECIAL TESTS:  No effect with cervical distraction; +left shoulder painful arc; + Vonzell Berber  TREATMENT DATE: 06/25/24 Evaluation       Plan of care including potential DN next visit including limited amt due to sensitivity with DN in the past, possible addition of ES and DN cost                                                                                                                             PATIENT EDUCATION:  Education details: Educated patient on anatomy and physiology of current symptoms, prognosis, plan of care as well as initial self care strategies to promote recovery Person educated: Patient Education method: Explanation Education comprehension: verbalized understanding  HOME EXERCISE PROGRAM: To be started  ASSESSMENT:  CLINICAL IMPRESSION: Patient is a 68 y.o. female who was seen today for physical therapy evaluation and treatment for chronic neck and shoulder pain.  The patient would benefit from skilled PT to address decreased cervical range of motion, correct muscle strength asymmetries and weakness in deep cervical flexors and extensors, improve postural strength including and scapular retractor and depressors and address pain levels.  All affect patient's ability to perform ADLS including  driving, sleeping and lifting/carrying items like groceries.      OBJECTIVE IMPAIRMENTS:  decreased activity tolerance, decreased ROM, decreased strength, increased fascial restrictions, impaired perceived functional ability, impaired UE functional use, and pain.   ACTIVITY LIMITATIONS: carrying, lifting, sleeping, and reach over head  PARTICIPATION LIMITATIONS: cleaning, driving, and shopping  PERSONAL FACTORS: Time since onset of injury/illness/exacerbation and 3+ comorbidities: migraines, fibromyalgia, recent surgery are also affecting patient's functional outcome.   REHAB POTENTIAL: Good  CLINICAL DECISION MAKING: Evolving/moderate complexity  EVALUATION COMPLEXITY: Moderate   GOALS: Goals reviewed with patient? Yes  SHORT TERM GOALS: Target date: 07/23/2024   The patient will demonstrate knowledge of basic self care strategies and exercises to promote healing  Baseline:  Goal status: INITIAL  2.  Improved cervical rotation ROM to 35 degrees and sidebending to 45 degrees  needed for driving Baseline:  Goal status: INITIAL  3.  The patient will report a 30% improvement in pain levels with functional activities which are currently difficult including carrying items and driving Baseline:  Goal status: INITIAL  4.  The patient will have grossly 4/5 strength in UE and periscapular muscles needed to carry items in left arm  Baseline:  Goal status: INITIAL     LONG TERM GOALS: Target date: 08/20/2024    The patient will be independent in a safe self progression of a home exercise program to promote further recovery of function  Baseline:  Goal status: INITIAL  2.  The patient will report a 60% improvement in pain levels with functional activities which are currently difficult including carrying items and driving Baseline:  Goal status: INITIAL  3.  The patient will have grossly 4+/5 strength needed to lift and lower a 3# object from a high shelf  Baseline:  Goal status: INITIAL  4.   Improved cervical rotation ROM to 40 degrees and sidebending to 50 degrees needed for driving Baseline:  Goal status: INITIAL  5.  Neck Disability Index improved to 28% indicating improved function with less pain Baseline:  Goal status: INITIAL    PLAN:  PT FREQUENCY: 1x/week  PT DURATION: 8 weeks  PLANNED INTERVENTIONS: 02835- PT Re-evaluation, 97750- Physical Performance Testing, 97110-Therapeutic exercises, 97530- Therapeutic activity, 97112- Neuromuscular re-education, 97535- Self Care, 02859- Manual therapy, [phone]- Aquatic Therapy, H9716- Electrical stimulation (unattended), [phone]- Electrical stimulation (manual), [id number]- Ultrasound, [id number]- Ionotophoresis 4mg /ml Dexamethasone , 79439 (1-2 muscles), 20561 (3+ muscles)- Dry Needling, Patient/Family education, Taping, Joint mobilization, Spinal mobilization, Cryotherapy, and Moist heat  PLAN FOR NEXT SESSION: DN in moderation/marination left cervical multifidi/suboccipitals, upper traps, levator, rhomboids; low  reps and volume of exercise secondary to sensitivity due to fibromyalgia  Glade Pesa, PT 06/25/24 8:14 PM Phone: [phone] Fax: [phone]

## 2024-07-01 ENCOUNTER — Ambulatory Visit: Admitting: Physical Therapy

## 2024-07-01 ENCOUNTER — Encounter: Payer: Self-pay | Admitting: Physical Therapy

## 2024-07-01 DIAGNOSIS — M542 Cervicalgia: Secondary | ICD-10-CM | POA: Diagnosis not present

## 2024-07-01 DIAGNOSIS — M6281 Muscle weakness (generalized): Secondary | ICD-10-CM

## 2024-07-01 DIAGNOSIS — G8929 Other chronic pain: Secondary | ICD-10-CM

## 2024-07-01 NOTE — Patient Instructions (Signed)

## 2024-07-01 NOTE — Therapy (Signed)
 OUTPATIENT PHYSICAL THERAPY CERVICAL/SHOULDER PROGRESS NOTE   Patient Name: Molly Giles MRN: [id number] DOB:Sep 17, 1955, 68 y.o., female Today's Date: 07/01/2024  END OF SESSION:  PT End of Session - 07/01/24 0848     Visit Number 2    Date for Recertification  08/20/24    Authorization Type medicare and BCBS supplemental    Progress Note Due on Visit 10    PT Start Time 0848    PT Stop Time 0930    PT Time Calculation (min) 42 min    Activity Tolerance Patient tolerated treatment well          Past Medical History:  Diagnosis Date   Abdominal bloating 05/10/2021   Abdominal discomfort, generalized 05/07/2016   Acquired hallux rigidus of right foot 09/15/2022   Acute gastritis 11/19/2020   Allergic rhinoconjunctivitis 07/02/2015   Anal fissure 11/19/2020   Anterior to posterior tear of superior glenoid labrum of left shoulder 02/22/2021   Arthritis    Arthritis of carpometacarpal [facility]) joint of left thumb 09/07/2023   Aseptic loosening of prosthetic hip, initial encounter 11/29/2017   Benign essential hypertension 10/28/2012   Benign positional vertigo 04/2015   Responded well to [facility]   Bruxism (teeth grinding)    Cervical spondylosis 11/18/2018   Chronic contact dermatitis 08/06/2018   Per allergist Dr Almarie Scala.    Chronic interstitial cystitis 11/09/2011   Managed by [facility] urology branch in [place] hydrodistention 5 years ago with Dr Claudene in [place], has relief of symptoms until now     Chronic low back pain 05/14/2019   Chronic migraine 02/25/2016   Per Dr Ines review in notes from [facility] from September 2015. Showed total headache days last month 18. Severe headache days 7 days. Moderate headache days 5 days. Mild headache days last month sick days. Days without headache last month 10 days. Symptoms associated with photophobia, phonophobia, osmophobia, neck pain, dizziness, jaw pain, nasal congestion, vision disturbances,  tingling and numbness, weakness and worsening with activity. Each headache attack last 3 hours depending on treatment in severity. Left side, the right side, easier side, the frontal area in the back of the head. Characterized as throbbing, pressure, tightness, squeezing, stabbing and burning    Chronic migraine without aura 05/15/2013    Dr Ines [facility]   Chronic tonsillitis 01/27/2019   DDD (degenerative disc disease), cervical 11/18/2018   DDD (degenerative disc disease), lumbar    Depression    Disc displacement, lumbar    Dysphagia 10/11/2021   Dysrhythmia    seen by dr Blanca- not a problem since she has been on Bystolic    Epiretinal membrane (ERM) of left eye 10/17/2021   Periodic ophthalmological monitoring     Episodic cluster headache, not intractable 03/06/2017   Essential hypertension 05/15/2013   Family history of adverse reaction to anesthesia    Brother- N/V   Family history of premature CAD 05/15/2013   Fibromyalgia 10/01/2013   Management by Dr GORMAN. Devashwar (Rhe[facility])     Fibromyalgia syndrome 07/01/2015   Management by Dr GORMAN. Devashwar ([facility]he[facility])    GERD (gastroesophageal reflux disease) 12/16/2014   H/O seasonal allergies    Hallux rigidus of right foot 09/25/2022   Hammer toe    Left great toe   Hashimoto's thyroiditis    Per patient, diagnosed by Dr. Sharyne Pacini   Hearing loss of both ears 07/27/2015   mild to borderline moderate low frequency hearing loss improving to within normal limits bilaterally  on audiology testing at [facility] in November 2016.     History of Clostridioides difficile colitis, required fecal transplantation 12/20/2021   History of Clostridium difficile colitis 07/01/2015   Required Fecal Transplantation tocure   History of colonic polyps    History of left hip replacement 09/20/2017   History of revision of total hip arthroplasty 04/10/2018   Hx of bad fall 02/2015   Severe Facial/head trauma without  fracture   Hyperhidrosis, scalp, 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   Hyperlipidemia 1998   Hypokalemia due to excessive gastrointestinal loss of potassium 07/28/2019   Hypothyroidism    Iliopsoas bursitis of left hip 05/08/2022   Impairment of balance 02/2015   Consequent of postconcussive syndrome   Injury of triangular fibrocartilage complex (TFCC) 02/25/2019   Injury of triangular fibrocartilage complex of left wrist 02/25/2019   Dx 02/25/19 Prentice Pagan IV MD ([facility])   Insulin  resistance 07/04/2017   Internal hemorrhoid 01/28/2021   Internal hemorrhoid seen on colonoscopy 10/2020 REGINOLD Sol MD [facility])   Interstitial cystitis    Irritable bowel syndrome with diarrhea 04/03/2016   Keratoconjunctivitis sicca 11/18/2018   (+) ANA     Left ventricular hypertrophy, mild 02/25/2016   ECHOcardiogram report 06/17/15 showing EF55-60%, mild LVH and G1DD    Loose total hip arthroplasty 03/10/2018   [facility]   Low serum vitamin B12 01/10/2023   Lumbar facet joint pain    Meibomian gland dysfunction (MGD) of both eyes 10/17/2021   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   Metatarsalgia of both feet 07/31/2022   Mood disorder    Morbid obesity (HCC) 03/06/2017   Morton's neuroma of right foot 09/15/2022   Musculoskeletal neck pain 07/14/2015   Nocturnal hypoxemia 03/06/2017   Normal coronary arteries 05/14/2014   Nuclear sclerosis of both eyes 10/17/2021   OAB (overactive bladder) 03/02/2021   Formatting of this note might be different from the original.  Added automatically from request for surgery [id number]     Formatting of this note might be different from the original.  Added automatically from request for surgery [id number]     Obesity (BMI 30.0-34.9) 01/29/2017   Odynophagia 12/20/2021   OSA (obstructive sleep apnea) 12/27/2022   Dr Dohmeier polysomnography titration study 04/27/24  There was no clinically significant apnea noted under 5 cm water  CPAP pressure with an AHI of 0.7/h.  What  was present was hypoxia- not severe, and loud snoring.   CMS AHI was 0/h under 6 cm water  CPAP which reduced the snoring somewhat and reduced hypoxia. Higher CPAP pressures induced more apneas and hypopneas.  The weight loss has reduced the   Osteoarthritis of acromioclavicular joint 02/22/2021   Osteoarthritis of left hip 11/01/2015   MRI order by Dr Ernie (ortho) 10/2015 showed significant arthritis of left hip joint with cystic changes in femoral head c/w osteoarthritis   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, lumbar region 10/30/2011   Other insomnia 11/09/2016   Overweight    Pain in joint of left shoulder 11/09/2016   Pain in joint, multiple sites 11/18/2018   Palpitations 05/15/2013   [facility] manages   Perianal candidiasis 12/20/2021   Periodic limb movement sleep disorder 03/28/2017   Perirectal cyst 05/07/2016   Plantar fasciitis of right foot 11/10/2022   Poison ivy dermatitis 03/24/2021   PONV (postoperative nausea and vomiting)    Positive ANA (antinuclear antibody) 11/18/2018   Post concussion syndrome 06/06/2015   Post concussive syndrome 07/14/2015   Ms Rorabaugh's post-concussive syndrome  manifesting in vertigo and headache, mood changes, poor balance, dizziness, and decreased concentration per Dr Ines at [facility].    Posterior vitreous detachment of right eye 2014   Premature menopause 12/20/2021   Primary osteoarthritis of left knee 06/20/2018   Psychophysiological insomnia 11/07/2022   Pure hypercholesterolemia 05/15/2013   Rectal abscess 05/10/2021   S/P left THA, AA 07/25/2016   S/P revision of total hip 04/10/2018   S/P tendon repair 01/23/2024   Sacroiliac inflammation 08/18/2021   Severe obstructive sleep apnea-hypopnea syndrome 12/22/2022   Shingles    Shortness of breath dyspnea    with exertion   Sicca syndrome 11/18/2018   (+) ANA   Sjogren syndrome 01/27/2019   Sjogren's syndrome 01/27/2019   Sleep walking and  eating 03/06/2017   Snoring 03/06/2017   Spondylosis of lumbar region without myelopathy or radiculopathy 10/30/2011   Status post total hip replacement, left 09/20/2017   TFC (triangular fibrocartilage complex) injury 02/25/2019   Thyroid  nodule 08/11/2009   Findings: The thyroid  gland is within normal limits in size.  The gland is diffusely inhomogeneous. A small solid nodule is noted in the lower pole  medially on the right of 7 x 6 x 8 mm. A small solid nodule is noted inferiorly on the left of 3 x 3 x 4 mm.  IMPRESSION:  The thyroid  gland is within normal limits in size with only small solid nodules present, the largest of only 8 mm in diameter on the right.     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    Trochanteric bursitis, right hip 04/26/2020   Tubular adenoma of colon 01/28/2021   Colonoscopy screening 4 mm tubular Adenoma polyp Marilu Sol, MD [facility])   Tubular adenoma of colon 01/28/2021   Colonoscopy screening 4 mm tubular Adenoma polyp Marilu Sol, MD [facility])   Vasomotor symptoms due to menopause 04/19/2017   Vertigo 03/19/2023   Vitamin D  deficiency 05/08/2017   Vulvitis 07/22/2020   Yeast vaginitis 07/11/2019   Past Surgical History:  Procedure Laterality Date   arthroscopy Left 01/2022   with SAD and DCR, K. Supple MD   Bladder dilitation     x 3   BREAST BIOPSY Right 2011   Benign histology   CARDIOVASCULAR STRESS TEST  2000   Unremarkable per pt report   CARPOMETACARPAL JOINT ARTHROTOMY Right 2011   COLONOSCOPY     COLONOSCOPY WITH PROPOFOL  N/A 04/21/2015   Procedure: COLONOSCOPY WITH PROPOFOL ;  Surgeon: Belvie Just, MD;  Location: [facility] ENDOSCOPY;  Service: Endoscopy;  Laterality: N/A;   CYSTOSCOPY W/ DILATION OF BLADDER N/A    EPIDURAL BLOCK INJECTION Left 04/12/2016   Left Medial Nerve Block and Left L5 ramus block, Dr Charlie Dolores    EPIDURAL BLOCK INJECTION  03/21/2016   Left  L3-4 medial branch block and Left L5 & dorsal ramus block    EPIDURAL BLOCK INJECTION N/A 10/25/2016   Charlie Dolores, MD. Lumbar medial branch block   EPIDURAL BLOCK INJECTION N/A 02/09/2017   Charlie Dolores, MD.  Bilateral L3/4 medial branch block, bilateral L5 dorsal ramus block   EPIDURAL BLOCK INJECTION N/A 07/04/2017   Charlie Dolores, MD   EXCISION MORTON'S NEUROMA Right 05/03/2023   Procedure: EXCISION MORTON'S NEUROMA;  Surgeon: Kit Rush, MD;  Location: [facility];  Service: Orthopedics;  Laterality: Right;  general, local by surgeon 60 MIN   FECAL TRANSPLANT  04/21/2015  Procedure: FECAL TRANSPLANT;  Surgeon: Belvie Just, MD;  Location: [facility] ENDOSCOPY;  Service: Endoscopy;;   HIP ADDUCTOR TENOTOMY  01/2024   HIP ARTHROPLASTY Left    HIP ARTHROSCOPY Left 03/06/2018   Left hip arthroplasty, redo for loose hip arthroplasty. Procedure at [facility] hospital   INJECTION HIP INTRA ARTICULAR Left 11/2015   for OA by Dr Charlie Bonner REDBIRD IMPLANT PLACEMENT  04/2021   Norleen Sharps MD ([facility])   INTERSTIM IMPLANT REVISION N/A 09/2021   Norleen Sharps MD ([facility])   LIGAMENT REPAIR Right 05/03/2023   Procedure: COLLATERAL LIGAMENT REPAIR;  Surgeon: Kit Norleen, MD;  Location: [facility];  Service: Orthopedics;  Laterality: Right;  general, local by surgeon 60 MIN   OTHER SURGICAL HISTORY Left 2016   Left L3/L4 medial nerve block and Left L5 Dorsal Ramus block Dr FABIENE Bonner   TOTAL HIP ARTHROPLASTY Left 07/25/2016   Procedure: LEFT TOTAL HIP ARTHROPLASTY ANTERIOR APPROACH;  Surgeon: Donnice Car, MD;  Location: [facility];  Service: Orthopedics;  Laterality: Left;   WEIL OSTEOTOMY Right 05/03/2023   Procedure: RIGHT 2ND WEIL OSTEOTOMY;  Surgeon: Kit Norleen, MD;  Location: [facility];  Service: Orthopedics;  Laterality: Right;  general, local by surgeon 60 MIN   Patient Active Problem List   Diagnosis Date Noted    Chronic pain of left upper extremity 02/19/2024   Arthritis of carpometacarpal [facility]) joint of left thumb 09/07/2023   BMI 26.0-26.9,adult 02/07/2023   Low serum vitamin B12 01/10/2023   OSA (obstructive sleep apnea) 12/27/2022   Severe obstructive sleep apnea-hypopnea syndrome 12/22/2022   Hyperglycemia 12/11/2022   Generalized obesity 12/11/2022   SOBOE (shortness of breath on exertion) 12/11/2022   Other fatigue 12/11/2022   Sleep related headaches 11/07/2022   Intractable episodic cluster headache 11/07/2022   Psychophysiological insomnia 11/07/2022   Hallux rigidus of right foot 09/25/2022   Chronic pain of right knee 05/08/2022   Arthrosis of hand 12/20/2021   History of Clostridioides difficile colitis, required fecal transplantation 12/20/2021   Meibomian gland dysfunction (MGD) of both eyes 10/17/2021   Nuclear sclerosis of both eyes 10/17/2021   Epiretinal membrane (ERM) of left eye 10/17/2021   OAB (overactive bladder) 03/02/2021   Osteoarthritis of acromioclavicular joint 02/22/2021   Hyperhidrosis, scalp, 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   Chronic low back pain 05/14/2019   Sjogren syndrome 01/27/2019   DDD (degenerative disc disease), cervical 12/27/2018   Cervical spondylosis 11/18/2018   DDD (degenerative disc disease), lumbar 11/18/2018   Keratoconjunctivitis sicca 11/18/2018   Chronic contact dermatitis 08/06/2018   Primary osteoarthritis of left knee 06/20/2018   Insulin  resistance 07/04/2017   Irritable bowel syndrome with diarrhea 04/03/2016   Left ventricular hypertrophy, mild 02/25/2016   Allergic rhinoconjunctivitis 07/02/2015   History of colonic polyps 07/01/2015   GERD (gastroesophageal reflux disease) 12/16/2014   Fibromyalgia 10/01/2013   Mood disorder 10/01/2013   Hyperlipidemia 10/01/2013   Pure hypercholesterolemia 05/15/2013   Chronic migraine without aura 05/15/2013   Benign essential hypertension 10/28/2012   Hypothyroidism 11/09/2011   Chronic  interstitial cystitis 11/09/2011    PCP: McDiarmid, Krystal MD  REFERRING PROVIDER:Supple, Franky MD  REFERRING DIAG: M25  THERAPY DIAG:  Cervicalgia  Chronic left shoulder pain  Muscle weakness (generalized)  Rationale for Evaluation and Treatment: Rehabilitation  ONSET DATE: chronic issue, worsened recently during and following surgery for hip  SUBJECTIVE:  SUBJECTIVE STATEMENT: Woke up with some buzzing on the left side on neck and upper trap.  After last time the next morning I couldn't go back to sleep b/c of the pain.    Hand dominance: Right  PERTINENT HISTORY:  History of PT for right hip pain and glute med tendon repair HTN, hypercholesterolemia, fibromyalgia (diagnosed decades ago), migraines usually on right side 3-4x/week gets an infusion every few months, arthritis, concussion  Left thumb bone spur Bout of vertigo 2024 summer Has 2# weights and red band but doesn't use, has to be careful to avoid flare up  PAIN:   Are you having pain? Yes NPRS scale: 4/10 Pain location: left neck, upper arm, medial scapula Pain orientation: Left  PAIN TYPE: aching, burning, and throbbing Pain description: constant  Aggravating factors: stiff with driving, looking up, moving head, raising left arm Relieving factors: minimizing activity level, migraine med   PRECAUTIONS: Other: limit volume of ex secondary to fibromyalgia   WEIGHT BEARING RESTRICTIONS: No  FALLS:  Has patient fallen in last 6 months? No  PLOF: Independent  PATIENT GOALS: stop that nerve irritation; lower the pain  NEXT MD VISIT: after PT  OBJECTIVE:  Note: Objective measures were completed at Evaluation unless otherwise noted.  DIAGNOSTIC FINDINGS:  Mild; FINDINGS: Alignment: Normal. There is no  significant antero or retrolisthesis. There is no jumped or perched facet.   Skull base and vertebrae: Skull base alignment is maintained. Vertebral body heights are preserved. There is no evidence of acute injury. There is no suspicious osseous lesion.   Soft tissues and spinal canal: No prevertebral fluid or swelling. No visible canal hematoma.   Disc levels: There is mild intervertebral disc space narrowing with mild associated uncovertebral arthropathy at C5-C6. There is overall minimal facet arthropathy throughout the cervical spine. There is up to mild left neural foraminal stenosis at C5-C6. There is no other significant spinal canal or neural foraminal stenosis.   Upper chest: The imaged lung apices are clear.   Other: None.   IMPRESSION: Minimal degenerative changes at C5-C6 resulting in mild left neural foraminal stenosis. Otherwise, unremarkable cervical spine CT with no other significant spinal canal or neural foraminal stenosis.    PATIENT SURVEYS:  NDI:  NECK DISABILITY INDEX  Date: 10/15 Score  Pain intensity 3 = The pain is fairly severe at the moment  2. Personal care (washing, dressing, etc.) 0 = I can look after myself normally without causing extra pain  3. Lifting 3 = Pain prevents me from lifting heavy weights but I can manage light to medium   weights if they are conveniently positioned  4. Reading 2 =  I can read as much as I want with moderate pain in my neck  5. Headaches 3 = I have moderate headaches, which come frequently  6. Concentration 1 =  I can concentrate fully when I want to with slight difficulty   7. Work 3 =  I cannot do my usual work  8. Driving 2 =  I can drive my car as long as I want with moderate pain in my neck  9. Sleeping 1 = My sleep is slightly disturbed (less than 1 hr sleepless)  10. Recreation 1 =  I am able to engage in all my recreation activities, with some pain in my neck  Total 38%   Minimum Detectable Change (90%  confidence): 5 points or 10% points  COGNITION: Overall cognitive status: Within functional limits for tasks assessed  PALPATION: Tender points left upper traps, left paraspinals, bil suboccipitals   CERVICAL ROM:   Active ROM A/PROM (deg) eval  Flexion 55  Extension 45 pain  Right lateral flexion 42 pain  Left lateral flexion 32  Right rotation 30  Left rotation 30   (Blank rows = not tested)  UPPER EXTREMITY ROM:  Active ROM Right eval Left eval  Shoulder flexion 155 150 pain  Shoulder extension    Shoulder abduction 165 160 pain  Shoulder adduction    Shoulder extension    Shoulder internal rotation T2 T8  Shoulder external rotation C7 C7  Elbow flexion    Elbow extension    Wrist flexion    Wrist extension    Wrist ulnar deviation    Wrist radial deviation    Wrist pronation    Wrist supination     (Blank rows = not tested)  UPPER EXTREMITY MMT:  MMT Right eval Left eval  Shoulder flexion 5 4-  Shoulder extension    Shoulder abduction 5 4-  Shoulder adduction    Shoulder extension    Shoulder internal rotation 5 4  Shoulder external rotation 5 4  Middle trapezius 4 4-  Lower trapezius 4 4-  Elbow flexion    Elbow extension    Wrist flexion    Wrist extension    Wrist ulnar deviation    Wrist radial deviation    Wrist pronation    Wrist supination    Grip strength     (Blank rows = not tested)  CERVICAL SPECIAL TESTS:  No effect with cervical distraction; +left shoulder painful arc; + Vonzell Berber  TREATMENT DATE: 07/01/24 Discussion of response to treatment and plan for session MELT method cervical foam roll: rotation, circles, fig 8, nods 5x each Seated upper trap and levator scap stretching Seated scap retraction Trigger Point Dry Needling Initial Treatment: Pt instructed on Dry Needling rational, procedures, and possible side effects. Pt instructed to expect mild to moderate muscle soreness later in the day and/or into the next  day.  Pt instructed in methods to reduce muscle soreness. Pt instructed to continue prescribed HEP. Patient verbalized understanding of these instructions and education.  Patient Verbal Consent Given: Yes Education Handout Provided: Yes Muscles Treated: left only cervical multifidi, left upper trap Electrical Stimulation Performed: No Treatment Response/Outcome: improved soft tissue mobility    TREATMENT DATE: 06/25/24 Evaluation       Plan of care including potential DN next visit including limited amt due to sensitivity with DN in the past, possible addition of ES and DN cost                                                                                                                            PATIENT EDUCATION:  Education details: Educated patient on anatomy and physiology of current symptoms, prognosis, plan of care as well as initial self care strategies to promote recovery Person educated: Patient Education method: Explanation  Education comprehension: verbalized understanding  HOME EXERCISE PROGRAM: MELT method with foam roll ASSESSMENT:  CLINICAL IMPRESSION: The patient had a positive initial response to DN with much improved soft tissue mobility and decreased size and number of tender points. Limited amount performed secondary to sensitivity with DN in the past.  Anticipate pain intensity and mobility will continue to improve over the next few days.  She will need a slower progression of exercise secondary to fibromyalgia and symptom sensitivity.    OBJECTIVE IMPAIRMENTS: decreased activity tolerance, decreased ROM, decreased strength, increased fascial restrictions, impaired perceived functional ability, impaired UE functional use, and pain.   ACTIVITY LIMITATIONS: carrying, lifting, sleeping, and reach over head  PARTICIPATION LIMITATIONS: cleaning, driving, and shopping  PERSONAL FACTORS: Time since onset of injury/illness/exacerbation and 3+ comorbidities:  migraines, fibromyalgia, recent surgery are also affecting patient's functional outcome.   REHAB POTENTIAL: Good  CLINICAL DECISION MAKING: Evolving/moderate complexity  EVALUATION COMPLEXITY: Moderate   GOALS: Goals reviewed with patient? Yes  SHORT TERM GOALS: Target date: 07/23/2024   The patient will demonstrate knowledge of basic self care strategies and exercises to promote healing  Baseline:  Goal status: INITIAL  2.  Improved cervical rotation ROM to 35 degrees and sidebending to 45 degrees needed for driving Baseline:  Goal status: INITIAL  3.  The patient will report a 30% improvement in pain levels with functional activities which are currently difficult including carrying items and driving Baseline:  Goal status: INITIAL  4.  The patient will have grossly 4/5 strength in UE and periscapular muscles needed to carry items in left arm  Baseline:  Goal status: INITIAL     LONG TERM GOALS: Target date: 08/20/2024    The patient will be independent in a safe self progression of a home exercise program to promote further recovery of function  Baseline:  Goal status: INITIAL  2.  The patient will report a 60% improvement in pain levels with functional activities which are currently difficult including carrying items and driving Baseline:  Goal status: INITIAL  3.  The patient will have grossly 4+/5 strength needed to lift and lower a 3# object from a high shelf  Baseline:  Goal status: INITIAL  4.   Improved cervical rotation ROM to 40 degrees and sidebending to 50 degrees needed for driving Baseline:  Goal status: INITIAL  5.  Neck Disability Index improved to 28% indicating improved function with less pain Baseline:  Goal status: INITIAL    PLAN:  PT FREQUENCY: 1x/week  PT DURATION: 8 weeks  PLANNED INTERVENTIONS: 02835- PT Re-evaluation, 97750- Physical Performance Testing, 97110-Therapeutic exercises, 97530- Therapeutic activity, 97112-  Neuromuscular re-education, 97535- Self Care, 02859- Manual therapy, [phone]- Aquatic Therapy, H9716- Electrical stimulation (unattended), [phone]- Electrical stimulation (manual), [id number]- Ultrasound, [id number]- Ionotophoresis 4mg /ml Dexamethasone , 79439 (1-2 muscles), 20561 (3+ muscles)- Dry Needling, Patient/Family education, Taping, Joint mobilization, Spinal mobilization, Cryotherapy, and Moist heat  PLAN FOR NEXT SESSION: assess response to DN in moderation/marination left cervical multifidi/suboccipitals, upper traps, levator, rhomboids; low reps and volume of exercise secondary to sensitivity due to fibromyalgia  Glade Pesa, PT 07/01/24 8:39 PM Phone: [phone] Fax: [phone]

## 2024-07-03 ENCOUNTER — Other Ambulatory Visit (HOSPITAL_COMMUNITY): Payer: Self-pay

## 2024-07-03 ENCOUNTER — Encounter (INDEPENDENT_AMBULATORY_CARE_PROVIDER_SITE_OTHER): Payer: Self-pay | Admitting: Family Medicine

## 2024-07-03 ENCOUNTER — Ambulatory Visit (INDEPENDENT_AMBULATORY_CARE_PROVIDER_SITE_OTHER): Admitting: Family Medicine

## 2024-07-03 ENCOUNTER — Other Ambulatory Visit: Payer: Self-pay

## 2024-07-03 VITALS — BP 94/67 | HR 76 | Temp 97.4°F | Ht 66.0 in | Wt 162.0 lb

## 2024-07-03 DIAGNOSIS — Z6826 Body mass index (BMI) 26.0-26.9, adult: Secondary | ICD-10-CM | POA: Diagnosis not present

## 2024-07-03 DIAGNOSIS — R632 Polyphagia: Secondary | ICD-10-CM | POA: Diagnosis not present

## 2024-07-03 DIAGNOSIS — E66811 Obesity, class 1: Secondary | ICD-10-CM

## 2024-07-03 DIAGNOSIS — E669 Obesity, unspecified: Secondary | ICD-10-CM

## 2024-07-03 DIAGNOSIS — I959 Hypotension, unspecified: Secondary | ICD-10-CM | POA: Diagnosis not present

## 2024-07-03 DIAGNOSIS — G4733 Obstructive sleep apnea (adult) (pediatric): Secondary | ICD-10-CM

## 2024-07-03 MED ORDER — TIRZEPATIDE-WEIGHT MANAGEMENT 7.5 MG/0.5ML ~~LOC~~ SOAJ
7.5000 mg | SUBCUTANEOUS | 0 refills | Status: DC
  Filled 2024-07-03: qty 2, 28d supply, fill #0

## 2024-07-03 NOTE — Progress Notes (Signed)
 Office: [phone]  /  Fax: [phone]  WEIGHT SUMMARY AND BIOMETRICS  Anthropometric Measurements Height: 5' 6 (1.676 m) Weight: 162 lb (73.5 kg) BMI (Calculated): 26.16 Weight at Last Visit: 164 lb Weight Lost Since Last Visit: 2 lb Weight Gained Since Last Visit: 0 Starting Weight: 199 lb Total Weight Loss (lbs): 37 lb (16.8 kg) Peak Weight: 226 lb   Body Composition  Body Fat %: 40.4 % Fat Mass (lbs): 65.6 lbs Muscle Mass (lbs): 91.8 lbs Total Body Water  (lbs): 68 lbs Visceral Fat Rating : 10   Other Clinical Data Fasting: no Labs: no Today's Visit #: 22 Starting Date: 12/11/22    Chief Complaint: OBESITY    History of Present Illness Molly Giles is a 68 year old female who presents for obesity treatment and progress assessment.  She adheres to the category one eating plan about fifty percent of the time, struggles with protein intake, but consumes more fruits and vegetables and hydrates adequately. She does not skip meals and generally gets seven or more hours of sleep most nights.  She exercises for twenty minutes three days a week, including stretching and walking, and has lost two pounds in the last month since her last visit.  She is on Zepbound  at 7.5 mg weekly for polyphagia, experiencing stomach aches similar to when she first started the medication, particularly after the third dose. She uses Miralax  to manage this.  Her blood pressure was initially recorded at 84/60, improving to 94/67 upon repeat measurement. She is not on any antihypertensives. She recently increased her minoxidil dose from 2.5 mg to 5 mg.  She frequently visits her grandchildren and attends their ball games, involving a lot of walking.  No lightheadedness or dizziness despite low blood pressure readings.      PHYSICAL EXAM:  Blood pressure 94/67, pulse 76, temperature (!) 97.4 F (36.3 C), height 5' 6 (1.676 m), weight 162 lb (73.5 kg), SpO2 97%. Body mass  index is 26.15 kg/m.  DIAGNOSTIC DATA REVIEWED:  BMET    Component Value Date/Time   NA 144 09/10/2023 0731   K 4.2 09/10/2023 0731   CL 107 (H) 09/10/2023 0731   CO2 22 09/10/2023 0731   GLUCOSE 79 09/10/2023 0731   GLUCOSE 93 07/05/2019 1629   BUN 20 09/10/2023 0731   CREATININE 0.71 09/10/2023 0731   CREATININE 0.74 02/24/2016 1158   CALCIUM  9.3 09/10/2023 0731   GFRNONAA 65 07/24/2019 1116   GFRNONAA 88 02/24/2016 1158   GFRAA 75 07/24/2019 1116   GFRAA >89 02/24/2016 1158   Lab Results  Component Value Date   HGBA1C 5.2 09/10/2023   HGBA1C 5.3 03/22/2017   Lab Results  Component Value Date   INSULIN  8.0 09/10/2023   INSULIN  10.5 03/22/2017   Lab Results  Component Value Date   TSH 2.290 10/18/2023   CBC    Component Value Date/Time   WBC 6.4 08/04/2020 0000   WBC 6.6 07/05/2019 1629   RBC 4.26 08/04/2020 0000   HGB 13.5 08/04/2020 0000   HGB 12.6 05/07/2018 1057   HCT 41 08/04/2020 0000   HCT 38.8 05/07/2018 1057   PLT 262 08/04/2020 0000   MCV 95.0 07/05/2019 1629   MCV 90 05/07/2018 1057   MCH 30.0 07/05/2019 1629   MCHC 31.6 07/05/2019 1629   RDW 13.8 07/05/2019 1629   RDW 14.0 05/07/2018 1057   Iron  Studies No results found for: IRON , TIBC, FERRITIN, IRONPCTSAT Lipid Panel     Component  Value Date/Time   CHOL 166 09/10/2023 0731   TRIG 95 09/10/2023 0731   HDL 51 09/10/2023 0731   CHOLHDL 3.3 10/11/2021 1142   CHOLHDL 3.9 12/14/2014 1036   VLDL 26 12/14/2014 1036   LDLCALC 97 09/10/2023 0731   Hepatic Function Panel     Component Value Date/Time   PROT 6.6 09/10/2023 0731   ALBUMIN 4.3 09/10/2023 0731   AST 21 09/10/2023 0731   ALT 21 09/10/2023 0731   ALKPHOS 84 09/10/2023 0731   BILITOT 0.3 09/10/2023 0731   BILIDIR 0.14 02/12/2020 1056      Component Value Date/Time   TSH 2.290 10/18/2023 1212   Nutritional Lab Results  Component Value Date   VD25OH 103.0 (H) 09/10/2023   VD25OH 41.2 12/11/2022   VD25OH 43.3  05/07/2018     Assessment and Plan Assessment & Plan Obesity and polyphagia management Obesity management is ongoing with dietary adherence and exercise. She follows the category one eating plan 50% of the time, with challenges in protein intake but improvements in fruit, vegetable intake, and hydration. She exercises 20 minutes three days a week and has lost two pounds in the last month, with a reduction in visceral fat. She is on Zepbound  7.5 mg weekly for polyphagia, reporting some stomach ache, expected to stabilize after four doses. The importance of not skipping meals to avoid nausea was discussed. - Continue Zepbound  7.5 mg weekly - Monitor for nausea and stomach ache; consider dose adjustment if symptoms persist - Encourage adherence to dietary plan with a focus on increasing protein intake to 75 grams or more - Continue current exercise regimen - Schedule follow-up appointment to review progress and lab results  Hypotension monitoring Blood pressure is low, with readings of 84/60 and 94/67, without symptoms of lightheadedness or dizziness. Low blood pressure may be influenced by recent dietary changes and increased minoxidil dose from 2.5 mg to 5 mg. Hydration status and fasting may also contribute to hypotension. - Monitor blood pressure regularly - Ensure adequate hydration - Evaluate the impact of minoxidil on blood pressure with Dermatology   Follow-Up Follow-up appointments are scheduled to monitor progress and address ongoing issues. - Schedule follow-up appointment on November 20th at 11:40 AM - Schedule follow-up appointment on December 30th at 3:40 PM       Molly Giles was informed of the importance of frequent follow up visits to maximize her success with intensive lifestyle modifications for her obesity and obesity related health conditions as recommended by USPSTF and CMS guidelines   Louann Penton, MD

## 2024-07-04 LAB — LIPID PANEL WITH LDL/HDL RATIO
Cholesterol, Total: 171 mg/dL (ref 100–199)
HDL: 53 mg/dL (ref >39)
LDL Chol Calc (NIH): 103 mg/dL — ABNORMAL HIGH (ref 0–99)
LDL/HDL Ratio: 1.9 ratio (ref 0.0–3.2)
Triglycerides: 77 mg/dL (ref 0–149)
VLDL Cholesterol Cal: 15 mg/dL (ref 5–40)

## 2024-07-04 LAB — CBC WITH DIFFERENTIAL/PLATELET
Basophils Absolute: 0.1 x10E3/uL (ref 0.0–0.2)
Basos: 1 %
EOS (ABSOLUTE): 0.2 x10E3/uL (ref 0.0–0.4)
Eos: 4 %
Hematocrit: 41.5 % (ref 34.0–46.6)
Hemoglobin: 13.4 g/dL (ref 11.1–15.9)
Immature Grans (Abs): 0 x10E3/uL (ref 0.0–0.1)
Immature Granulocytes: 0 %
Lymphocytes Absolute: 1.7 x10E3/uL (ref 0.7–3.1)
Lymphs: 28 %
MCH: 30.7 pg (ref 26.6–33.0)
MCHC: 32.3 g/dL (ref 31.5–35.7)
MCV: 95 fL (ref 79–97)
Monocytes Absolute: 0.6 x10E3/uL (ref 0.1–0.9)
Monocytes: 9 %
Neutrophils Absolute: 3.5 x10E3/uL (ref 1.4–7.0)
Neutrophils: 58 %
Platelets: 244 x10E3/uL (ref 150–450)
RBC: 4.37 x10E6/uL (ref 3.77–5.28)
RDW: 13.1 % (ref 11.7–15.4)
WBC: 6 x10E3/uL (ref 3.4–10.8)

## 2024-07-04 LAB — GLIADIN IGA+TTG IGA
Antigliadin Abs, IgA: 37 U — ABNORMAL HIGH (ref 0–19)
Transglutaminase IgA: <2 U/mL (ref 0–3)

## 2024-07-04 LAB — CMP14+EGFR
ALT: 13 IU/L (ref 0–32)
AST: 21 IU/L (ref 0–40)
Albumin: 4.5 g/dL (ref 3.9–4.9)
Alkaline Phosphatase: 104 IU/L (ref 49–135)
BUN/Creatinine Ratio: 21 (ref 12–28)
BUN: 17 mg/dL (ref 8–27)
Bilirubin Total: 0.4 mg/dL (ref 0.0–1.2)
CO2: 22 mmol/L (ref 20–29)
Calcium: 9.2 mg/dL (ref 8.7–10.3)
Chloride: 105 mmol/L (ref 96–106)
Creatinine, Ser: 0.82 mg/dL (ref 0.57–1.00)
Globulin, Total: 2 g/dL (ref 1.5–4.5)
Glucose: 73 mg/dL (ref 70–99)
Potassium: 4.2 mmol/L (ref 3.5–5.2)
Sodium: 140 mmol/L (ref 134–144)
Total Protein: 6.5 g/dL (ref 6.0–8.5)
eGFR: 77 mL/min/1.73 (ref >59)

## 2024-07-04 LAB — VITAMIN B12: Vitamin B-12: 391 pg/mL (ref 232–1245)

## 2024-07-04 LAB — VITAMIN D 25 HYDROXY (VIT D DEFICIENCY, FRACTURES): Vit D, 25-Hydroxy: 54.4 ng/mL (ref 30.0–100.0)

## 2024-07-04 LAB — INSULIN, RANDOM: INSULIN: 9.5 u[IU]/mL (ref 2.6–24.9)

## 2024-07-07 ENCOUNTER — Other Ambulatory Visit: Payer: Self-pay | Admitting: Family Medicine

## 2024-07-07 DIAGNOSIS — E038 Other specified hypothyroidism: Secondary | ICD-10-CM

## 2024-07-08 ENCOUNTER — Encounter (INDEPENDENT_AMBULATORY_CARE_PROVIDER_SITE_OTHER): Payer: Self-pay | Admitting: Family Medicine

## 2024-07-09 ENCOUNTER — Ambulatory Visit: Admitting: Physical Therapy

## 2024-07-17 ENCOUNTER — Ambulatory Visit: Attending: Orthopedic Surgery | Admitting: Physical Therapy

## 2024-07-17 ENCOUNTER — Encounter: Payer: Self-pay | Admitting: Physical Therapy

## 2024-07-17 DIAGNOSIS — R2689 Other abnormalities of gait and mobility: Secondary | ICD-10-CM | POA: Insufficient documentation

## 2024-07-17 DIAGNOSIS — G8929 Other chronic pain: Secondary | ICD-10-CM | POA: Insufficient documentation

## 2024-07-17 DIAGNOSIS — M542 Cervicalgia: Secondary | ICD-10-CM | POA: Insufficient documentation

## 2024-07-17 DIAGNOSIS — M6281 Muscle weakness (generalized): Secondary | ICD-10-CM | POA: Insufficient documentation

## 2024-07-17 DIAGNOSIS — M25512 Pain in left shoulder: Secondary | ICD-10-CM | POA: Insufficient documentation

## 2024-07-17 NOTE — Therapy (Signed)
 OUTPATIENT PHYSICAL THERAPY CERVICAL/SHOULDER PROGRESS NOTE   Patient Name: Molly Giles MRN: [id number] DOB:1956/02/04, 68 y.o., female Today's Date: 07/17/2024  END OF SESSION:  PT End of Session - 07/17/24 1106     Visit Number 3    Date for Recertification  08/20/24    Authorization Type medicare and BC[employer] supplemental ABN signed for November for DN    Progress Note Due on Visit 10    PT Start Time 1104    PT Stop Time 1144    PT Time Calculation (min) 40 min    Activity Tolerance Patient tolerated treatment well          Past Medical History:  Diagnosis Date   Abdominal bloating 05/10/2021   Abdominal discomfort, generalized 05/07/2016   Acquired hallux rigidus of right foot 09/15/2022   Acute gastritis 11/19/2020   Allergic rhinoconjunctivitis 07/02/2015   Anal fissure 11/19/2020   Anterior to posterior tear of superior glenoid labrum of left shoulder 02/22/2021   Arthritis    Arthritis of carpometacarpal (CMC) joint of left thumb 09/07/2023   Aseptic loosening of prosthetic hip, initial encounter 11/29/2017   Benign essential hypertension 10/28/2012   Benign positional vertigo 04/2015   Responded well to [facility]   Bruxism (teeth grinding)    Cervical spondylosis 11/18/2018   Chronic contact dermatitis 08/06/2018   Per allergist Dr Almarie Scala.    Chronic interstitial cystitis 11/09/2011   Managed by [facility] urology branch in [place] hydrodistention 5 years ago with Dr Claudene in [place], has relief of symptoms until now     Chronic low back pain 05/14/2019   Chronic migraine 02/25/2016   Per Dr Ines review in notes from [facility] from September 2015. Showed total headache days last month 18. Severe headache days 7 days. Moderate headache days 5 days. Mild headache days last month sick days. Days without headache last month 10 days. Symptoms associated with photophobia, phonophobia, osmophobia, neck pain, dizziness, jaw pain, nasal  congestion, vision disturbances, tingling and numbness, weakness and worsening with activity. Each headache attack last 3 hours depending on treatment in severity. Left side, the right side, easier side, the frontal area in the back of the head. Characterized as throbbing, pressure, tightness, squeezing, stabbing and burning    Chronic migraine without aura 05/15/2013    Dr Ines [facility]   Chronic tonsillitis 01/27/2019   DDD (degenerative disc disease), cervical 11/18/2018   DDD (degenerative disc disease), lumbar    Depression    Disc displacement, lumbar    Dysphagia 10/11/2021   Dysrhythmia    seen by dr Blanca- not a problem since she has been on Bystolic    Epiretinal membrane (ERM) of left eye 10/17/2021   Periodic ophthalmological monitoring     Episodic cluster headache, not intractable 03/06/2017   Essential hypertension 05/15/2013   Family history of adverse reaction to anesthesia    Brother- N/V   Family history of premature CAD 05/15/2013   Fibromyalgia 10/01/2013   Management by Dr GORMAN. Devashwar ([facility]he[facility])     Fibromyalgia syndrome 07/01/2015   Management by Dr GORMAN. Devashwar ([facility])    GERD (gastroesophageal reflux disease) 12/16/2014   H/O seasonal allergies    Hallux rigidus of right foot 09/25/2022   Hammer toe    Left great toe   Hashimoto's thyroiditis    Per patient, diagnosed by Dr. Sharyne Pacini   Hearing loss of both ears 07/27/2015   mild to borderline moderate low frequency hearing loss  improving to within normal limits bilaterally on audiology testing at [facility] in November 2016.     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   Hyperlipidemia 1998   Hypokalemia due to excessive gastrointestinal loss of potassium 07/28/2019   Hypothyroidism    Iliopsoas bursitis of left hip 05/08/2022   Impairment of balance 02/2015   Consequent of postconcussive syndrome   Injury of triangular fibrocartilage complex (TFCC) 02/25/2019   Injury of triangular fibrocartilage complex of left wrist 02/25/2019   Dx 02/25/19 Prentice Pagan IV MD ([facility])   Insulin  resistance 07/04/2017   Internal hemorrhoid 01/28/2021   Internal hemorrhoid seen on colonoscopy 10/2020 REGINOLD Sol MD [facility])   Interstitial cystitis    Irritable bowel syndrome with diarrhea 04/03/2016   Keratoconjunctivitis sicca 11/18/2018   (+) ANA     Left ventricular hypertrophy, mild 02/25/2016   ECHOcardiogram report 06/17/15 showing EF55-60%, mild LVH and G1DD    Loose total hip arthroplasty 03/10/2018   [facility]   Low serum vitamin B12 01/10/2023   Lumbar facet joint pain    Meibomian gland dysfunction (MGD) of both eyes 10/17/2021   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   Metatarsalgia of both feet 07/31/2022   Mood disorder    Morbid obesity (HCC) 03/06/2017   Morton's neuroma of right foot 09/15/2022   Musculoskeletal neck pain 07/14/2015   Nocturnal hypoxemia 03/06/2017   Normal coronary arteries 05/14/2014   Nuclear sclerosis of both eyes 10/17/2021   OAB (overactive bladder) 03/02/2021   Formatting of this note might be different from the original.  Added automatically from request for surgery [id number]     Formatting of this note might be different from the original.  Added automatically from request for surgery [id number]     Obesity (BMI 30.0-34.9) 01/29/2017   Odynophagia 12/20/2021   OSA (obstructive sleep apnea) 12/27/2022   Dr Dohmeier polysomnography titration study 04/27/24  There was no clinically significant apnea noted under 5 cm water  CPAP pressure  with an AHI of 0.7/h.  What was present was hypoxia- not severe, and loud snoring.   CMS AHI was 0/h under 6 cm water  CPAP which reduced the snoring somewhat and reduced hypoxia. Higher CPAP pressures induced more apneas and hypopneas.  The weight loss has reduced the   Osteoarthritis of acromioclavicular joint 02/22/2021   Osteoarthritis of left hip 11/01/2015   MRI order by Dr Ernie (ortho) 10/2015 showed significant arthritis of left hip joint with cystic changes in femoral head c/w osteoarthritis   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, lumbar region 10/30/2011   Other insomnia 11/09/2016   Overweight    Pain in joint of left shoulder 11/09/2016   Pain in joint, multiple sites 11/18/2018   Palpitations 05/15/2013    Cardiology manages   Perianal candidiasis 12/20/2021   Periodic limb movement sleep disorder 03/28/2017   Perirectal cyst 05/07/2016   Plantar fasciitis of right foot 11/10/2022   Poison ivy dermatitis 03/24/2021   PONV (postoperative nausea and vomiting)    Positive ANA (antinuclear antibody) 11/18/2018   Post concussion syndrome 06/06/2015   Post concussive syndrome 07/14/2015  Ms Christmas's post-concussive syndrome manifesting in vertigo and headache, mood changes, poor balance, dizziness, and decreased concentration per Dr Ines at [facility].    Posterior vitreous detachment of right eye 2014   Premature menopause 12/20/2021   Primary osteoarthritis of left knee 06/20/2018   Psychophysiological insomnia 11/07/2022   Pure hypercholesterolemia 05/15/2013   Rectal abscess 05/10/2021   S/P left THA, AA 07/25/2016   S/P revision of total hip 04/10/2018   S/P tendon repair 01/23/2024   Sacroiliac inflammation 08/18/2021   Severe obstructive sleep apnea-hypopnea syndrome 12/22/2022   Shingles    Shortness of breath dyspnea    with exertion   Sicca syndrome 11/18/2018   (+) ANA   Sjogren syndrome 01/27/2019   Sjogren's syndrome  01/27/2019   Sleep walking and eating 03/06/2017   Snoring 03/06/2017   Spondylosis of lumbar region without myelopathy or radiculopathy 10/30/2011   Status post total hip replacement, left 09/20/2017   TFC (triangular fibrocartilage complex) injury 02/25/2019   Thyroid  nodule 08/11/2009   Findings: The thyroid  gland is within normal limits in size.  The gland is diffusely inhomogeneous. A small solid nodule is noted in the lower pole  medially on the right of 7 x 6 x 8 mm. A small solid nodule is noted inferiorly on the left of 3 x 3 x 4 mm.  IMPRESSION:  The thyroid  gland is within normal limits in size with only small solid nodules present, the largest of only 8 mm in diameter on the right.     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    Trochanteric bursitis, right hip 04/26/2020   Tubular adenoma of colon 01/28/2021   Colonoscopy screening 4 mm tubular Adenoma polyp Marilu Sol, MD [facility])   Tubular adenoma of colon 01/28/2021   Colonoscopy screening 4 mm tubular Adenoma polyp Marilu Sol, MD [facility])   Vasomotor symptoms due to menopause 04/19/2017   Vertigo 03/19/2023   Vitamin D  deficiency 05/08/2017   Vulvitis 07/22/2020   Yeast vaginitis 07/11/2019   Past Surgical History:  Procedure Laterality Date   arthroscopy Left 01/2022   with SAD and DCR, K. Supple MD   Bladder dilitation     x 3   BREAST BIOPSY Right 2011   Benign histology   CARDIOVASCULAR STRESS TEST  2000   Unremarkable per pt report   CARPOMETACARPAL JOINT ARTHROTOMY Right 2011   COLONOSCOPY     COLONOSCOPY WITH PROPOFOL  N/A 04/21/2015   Procedure: COLONOSCOPY WITH PROPOFOL ;  Surgeon: Belvie Just, MD;  Location: [facility] ENDOSCOPY;  Service: Endoscopy;  Laterality: N/A;   CYSTOSCOPY W/ DILATION OF BLADDER N/A    EPIDURAL BLOCK INJECTION Left 04/12/2016   Left Medial Nerve Block and Left L5 ramus block, Dr Charlie Dolores    EPIDURAL BLOCK  INJECTION  03/21/2016   Left L3-4 medial branch block and Left L5 & dorsal ramus block    EPIDURAL BLOCK INJECTION N/A 10/25/2016   Charlie Dolores, MD. Lumbar medial branch block   EPIDURAL BLOCK INJECTION N/A 02/09/2017   Charlie Dolores, MD.  Bilateral L3/4 medial branch block, bilateral L5 dorsal ramus block   EPIDURAL BLOCK INJECTION N/A 07/04/2017   Charlie Dolores, MD   EXCISION MORTON'S NEUROMA Right 05/03/2023   Procedure: EXCISION MORTON'S NEUROMA;  Surgeon: Kit Rush, MD;  Location: [facility];  Service: Orthopedics;  Laterality: Right;  general, local by surgeon 60 MIN   FECAL TRANSPLANT  04/21/2015   Procedure: FECAL TRANSPLANT;  Surgeon: Belvie Just, MD;  Location: [facility] END[facility]COPY;  Service: Endoscopy;;   HIP ADDUCTOR TENOTOMY  01/2024   HIP ARTHROPLASTY Left    HIP ARTHROSCOPY Left 03/06/2018   Left hip arthroplasty, redo for loose hip arthroplasty. Procedure at [facility]   INJECTION HIP INTRA ARTICULAR Left 11/2015   for OA by Dr Charlie Bonner REDBIRD IMPLANT PLACEMENT  04/2021   Norleen Sharps MD ([facility])   INTERSTIM IMPLANT REVISION N/A 09/2021   Norleen Sharps MD ([facility])   LIGAMENT REPAIR Right 05/03/2023   Procedure: COLLATERAL LIGAMENT REPAIR;  Surgeon: Kit Norleen, MD;  Location: [facility];  Service: Orthopedics;  Laterality: Right;  general, local by surgeon 60 MIN   OTHER SURGICAL HISTORY Left 2016   Left L3/L4 medial nerve block and Left L5 Dorsal Ramus block Dr FABIENE Bonner   TOTAL HIP ARTHROPLASTY Left 07/25/2016   Procedure: LEFT TOTAL HIP ARTHROPLASTY ANTERIOR APPROACH;  Surgeon: Donnice Car, MD;  Location: [facility];  Service: Orthopedics;  Laterality: Left;   WEIL OSTEOTOMY Right 05/03/2023   Procedure: RIGHT 2ND WEIL OSTEOTOMY;  Surgeon: Kit Norleen, MD;  Location: [facility];  Service: Orthopedics;  Laterality: Right;  general, local by surgeon 60 MIN   Patient Active Problem  List   Diagnosis Date Noted   Chronic pain of left upper extremity 02/19/2024   Arthritis of carpometacarpal [facility]) joint of left thumb 09/07/2023   BMI 26.0-26.9,adult 02/07/2023   Low serum vitamin B12 01/10/2023   OSA (obstructive sleep apnea) 12/27/2022   Severe obstructive sleep apnea-hypopnea syndrome 12/22/2022   Hyperglycemia 12/11/2022   Generalized obesity 12/11/2022   SOBOE (shortness of breath on exertion) 12/11/2022   Other fatigue 12/11/2022   Sleep related headaches 11/07/2022   Intractable episodic cluster headache 11/07/2022   Psychophysiological insomnia 11/07/2022   Hallux rigidus of right foot 09/25/2022   Chronic pain of right knee 05/08/2022   Arthrosis of hand 12/20/2021   History of Clostridioides difficile colitis, required fecal transplantation 12/20/2021   Meibomian gland dysfunction (MGD) of both eyes 10/17/2021   Nuclear sclerosis of both eyes 10/17/2021   Epiretinal membrane (ERM) of left eye 10/17/2021   OAB (overactive bladder) 03/02/2021   Osteoarthritis of acromioclavicular joint 02/22/2021   Hyperhidrosis, scalp, 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   Chronic low back pain 05/14/2019   Sjogren syndrome 01/27/2019   DDD (degenerative disc disease), cervical 12/27/2018   Cervical spondylosis 11/18/2018   DDD (degenerative disc disease), lumbar 11/18/2018   Keratoconjunctivitis sicca 11/18/2018   Chronic contact dermatitis 08/06/2018   Primary osteoarthritis of left knee 06/20/2018   Insulin  resistance 07/04/2017   Irritable bowel syndrome with diarrhea 04/03/2016   Left ventricular hypertrophy, mild 02/25/2016   Allergic rhinoconjunctivitis 07/02/2015   History of colonic polyps 07/01/2015   GERD (gastroesophageal reflux disease) 12/16/2014   Fibromyalgia 10/01/2013   Mood disorder 10/01/2013   Hyperlipidemia 10/01/2013   Pure hypercholesterolemia 05/15/2013   Chronic migraine without aura 05/15/2013   Benign essential hypertension 10/28/2012    Hypothyroidism 11/09/2011   Chronic interstitial cystitis 11/09/2011    PCP: McDiarmid, Krystal MD  REFERRING PROVIDER:Supple, Franky MD  REFERRING DIAG: M25  THERAPY DIAG:  Cervicalgia  Chronic left shoulder pain  Muscle weakness (generalized)  Rationale for Evaluation and Treatment: Rehabilitation  ONSET DATE: chronic issue, worsened recently during and following surgery for hip  SUBJECTIVE:  SUBJECTIVE STATEMENT: Got a foam roll for home use, it's a short one.  Had a cervical injection but can't tell that it's helped yet.   Main areas of focus: Upper trap, upper c-spine left   Hand dominance: Right  PERTINENT HISTORY:  History of PT for right hip pain and glute med tendon repair HTN, hypercholesterolemia, fibromyalgia (diagnosed decades ago), migraines usually on right side 3-4x/week gets an infusion every few months, arthritis, concussion  Left thumb bone spur Bout of vertigo 2024 summer Has 2# weights and red band but doesn't use, has to be careful to avoid flare up  PAIN:   Are you having pain? Yes NPRS scale: 6/10 Pain location: left neck, upper arm, medial scapula Pain orientation: Left  PAIN TYPE: aching, burning, and throbbing Pain description: constant  Aggravating factors: stiff with driving, looking up, moving head, raising left arm Relieving factors: minimizing activity level, migraine med   PRECAUTIONS: Other: limit volume of ex secondary to fibromyalgia   WEIGHT BEARING RESTRICTIONS: No  FALLS:  Has patient fallen in last 6 months? No  PLOF: Independent  PATIENT GOALS: stop that nerve irritation; lower the pain  NEXT MD VISIT: after PT  OBJECTIVE:  Note: Objective measures were completed at Evaluation unless otherwise noted.  DIAGNOSTIC FINDINGS:   Mild; FINDINGS: Alignment: Normal. There is no significant antero or retrolisthesis. There is no jumped or perched facet.   Skull base and vertebrae: Skull base alignment is maintained. Vertebral body heights are preserved. There is no evidence of acute injury. There is no suspicious osseous lesion.   Soft tissues and spinal canal: No prevertebral fluid or swelling. No visible canal hematoma.   Disc levels: There is mild intervertebral disc space narrowing with mild associated uncovertebral arthropathy at C5-C6. There is overall minimal facet arthropathy throughout the cervical spine. There is up to mild left neural foraminal stenosis at C5-C6. There is no other significant spinal canal or neural foraminal stenosis.   Upper chest: The imaged lung apices are clear.   Other: None.   IMPRESSION: Minimal degenerative changes at C5-C6 resulting in mild left neural foraminal stenosis. Otherwise, unremarkable cervical spine CT with no other significant spinal canal or neural foraminal stenosis.    PATIENT SURVEYS:  NDI:  NECK DISABILITY INDEX  Date: 10/15 Score  Pain intensity 3 = The pain is fairly severe at the moment  2. Personal care (washing, dressing, etc.) 0 = I can look after myself normally without causing extra pain  3. Lifting 3 = Pain prevents me from lifting heavy weights but I can manage light to medium   weights if they are conveniently positioned  4. Reading 2 =  I can read as much as I want with moderate pain in my neck  5. Headaches 3 = I have moderate headaches, which come frequently  6. Concentration 1 =  I can concentrate fully when I want to with slight difficulty   7. Work 3 =  I cannot do my usual work  8. Driving 2 =  I can drive my car as long as I want with moderate pain in my neck  9. Sleeping 1 = My sleep is slightly disturbed (less than 1 hr sleepless)  10. Recreation 1 =  I am able to engage in all my recreation activities, with some pain in my neck   Total 38%   Minimum Detectable Change (90% confidence): 5 points or 10% points  COGNITION: Overall cognitive status: Within functional limits for tasks  assessed  PALPATION: Tender points left upper traps, left paraspinals, bil suboccipitals   CERVICAL ROM:   Active ROM A/PROM (deg) eval  Flexion 55  Extension 45 pain  Right lateral flexion 42 pain  Left lateral flexion 32  Right rotation 30  Left rotation 30   (Blank rows = not tested)  UPPER EXTREMITY ROM:  Active ROM Right eval Left eval  Shoulder flexion 155 150 pain  Shoulder extension    Shoulder abduction 165 160 pain  Shoulder adduction    Shoulder extension    Shoulder internal rotation T2 T8  Shoulder external rotation C7 C7  Elbow flexion    Elbow extension    Wrist flexion    Wrist extension    Wrist ulnar deviation    Wrist radial deviation    Wrist pronation    Wrist supination     (Blank rows = not tested)  UPPER EXTREMITY MMT:  MMT Right eval Left eval  Shoulder flexion 5 4-  Shoulder extension    Shoulder abduction 5 4-  Shoulder adduction    Shoulder extension    Shoulder internal rotation 5 4  Shoulder external rotation 5 4  Middle trapezius 4 4-  Lower trapezius 4 4-  Elbow flexion    Elbow extension    Wrist flexion    Wrist extension    Wrist ulnar deviation    Wrist radial deviation    Wrist pronation    Wrist supination    Grip strength     (Blank rows = not tested)  CERVICAL SPECIAL TESTS:  No effect with cervical distraction; +left shoulder painful arc; + Vonzell Berber  TREATMENT DATE: 07/17/24 Seated thoracic extension with foam roll 10x Wall foam roll ups with Ues 5x Back to wall with foam vertically with open books, UE elevation, snow angels (cues for thumb up position with abduction due to shoulder discomfort) 5 reps each Karolynn pose with foam roll uncomfortable on shoulder discontinued at 3 reps MELT method cervical foam roll: rotation, circles, fig 8, nods  5x each Trigger Point Dry Needling Subsequent Treatment: Pt instructed on Dry Needling rational, procedures, and possible side effects. Pt instructed to expect mild to moderate muscle soreness later in the day and/or into the next day.  Pt instructed in methods to reduce muscle soreness. Pt instructed to continue prescribed HEP. Patient verbalized understanding of these instructions and education.  Patient Verbal Consent Given: Yes Education Handout Provided: Yes Muscles Treated: bil cervical multifidi, bil suboccipitals;  left upper trap Electrical Stimulation Performed: No Treatment Response/Outcome: improved soft tissue mobility  TREATMENT DATE: 07/01/24 Discussion of response to treatment and plan for session MELT method cervical foam roll: rotation, circles, fig 8, nods 5x each Seated upper trap and levator scap stretching Seated scap retraction Trigger Point Dry Needling Initial Treatment: Pt instructed on Dry Needling rational, procedures, and possible side effects. Pt instructed to expect mild to moderate muscle soreness later in the day and/or into the next day.  Pt instructed in methods to reduce muscle soreness. Pt instructed to continue prescribed HEP. Patient verbalized understanding of these instructions and education.  Patient Verbal Consent Given: Yes Education Handout Provided: Yes Muscles Treated: left only cervical multifidi, left upper trap Electrical Stimulation Performed: No Treatment Response/Outcome: improved soft tissue mobility    TREATMENT DATE: 06/25/24 Evaluation       Plan of care including potential DN next visit including limited amt due to sensitivity with DN in the past, possible addition of ES  and DN cost                                                                                                                            PATIENT EDUCATION:  Education details: Educated patient on anatomy and physiology of current symptoms, prognosis, plan  of care as well as initial self care strategies to promote recovery Person educated: Patient Education method: Explanation Education comprehension: verbalized understanding  HOME EXERCISE PROGRAM: MELT method with foam roll ASSESSMENT:  CLINICAL IMPRESSION: Susan responds well to low level mobility ex's using the soft foam roll.  Added dry needling to right cervical multifidi and bil suboccipitals to stimulate underlying myofascial trigger points and muscular tissue for management of neuromusculoskeletal pain and address movement impairments.  Much improved soft tissue mobility following treatment session.   Therapist monitoring response to all interventions and modifying treatment accordingly.    OBJECTIVE IMPAIRMENTS: decreased activity tolerance, decreased ROM, decreased strength, increased fascial restrictions, impaired perceived functional ability, impaired UE functional use, and pain.   ACTIVITY LIMITATIONS: carrying, lifting, sleeping, and reach over head  PARTICIPATION LIMITATIONS: cleaning, driving, and shopping  PERSONAL FACTORS: Time since onset of injury/illness/exacerbation and 3+ comorbidities: migraines, fibromyalgia, recent surgery are also affecting patient's functional outcome.   REHAB POTENTIAL: Good  CLINICAL DECISION MAKING: Evolving/moderate complexity  EVALUATION COMPLEXITY: Moderate   GOALS: Goals reviewed with patient? Yes  SHORT TERM GOALS: Target date: 07/23/2024   The patient will demonstrate knowledge of basic self care strategies and exercises to promote healing  Baseline:  Goal status: INITIAL  2.  Improved cervical rotation ROM to 35 degrees and sidebending to 45 degrees needed for driving Baseline:  Goal status: INITIAL  3.  The patient will report a 30% improvement in pain levels with functional activities which are currently difficult including carrying items and driving Baseline:  Goal status: INITIAL  4.  The patient will have grossly  4/5 strength in UE and periscapular muscles needed to carry items in left arm  Baseline:  Goal status: INITIAL     LONG TERM GOALS: Target date: 08/20/2024    The patient will be independent in a safe self progression of a home exercise program to promote further recovery of function  Baseline:  Goal status: INITIAL  2.  The patient will report a 60% improvement in pain levels with functional activities which are currently difficult including carrying items and driving Baseline:  Goal status: INITIAL  3.  The patient will have grossly 4+/5 strength needed to lift and lower a 3# object from a high shelf  Baseline:  Goal status: INITIAL  4.   Improved cervical rotation ROM to 40 degrees and sidebending to 50 degrees needed for driving Baseline:  Goal status: INITIAL  5.  Neck Disability Index improved to 28% indicating improved function with less pain Baseline:  Goal status: INITIAL    PLAN:  PT FREQUENCY: 1x/week  PT DURATION: 8 weeks  PLANNED INTERVENTIONS: 02835- PT  Re-evaluation, 97750- Physical Performance Testing, 97110-Therapeutic exercises, 97530- Therapeutic activity, [id number]- Neuromuscular re-education, [phone]- Self Care, [address]2859- Manual therapy, [id number]- Aquatic Therapy, [phone]- Electrical stimulation (unattended), [phone]- Electrical stimulation (manual), [id number]- Ultrasound, [id number]- Ionotophoresis 4mg /ml Dexamethasone , 79439 (1-2 muscles), 20561 (3+ muscles)- Dry Needling, Patient/Family education, Taping, Joint mobilization, Spinal mobilization, Cryotherapy, and Moist heat  PLAN FOR NEXT SESSION: assess response to DN in moderation/marination bil cervical multifidi/suboccipitals, upper traps, and add levator, rhomboids as tolerated; foam roll MELT and thoracic mobility; low reps and volume of exercise secondary to sensitivity due to fibromyalgia  Glade Pesa, PT 07/17/24 2:15 PM Phone: [phone] Fax: [phone]

## 2024-07-23 ENCOUNTER — Ambulatory Visit: Admitting: Physical Therapy

## 2024-07-30 ENCOUNTER — Ambulatory Visit: Admitting: Physical Therapy

## 2024-07-30 ENCOUNTER — Encounter: Payer: Self-pay | Admitting: Physical Therapy

## 2024-07-30 DIAGNOSIS — G8929 Other chronic pain: Secondary | ICD-10-CM

## 2024-07-30 DIAGNOSIS — M6281 Muscle weakness (generalized): Secondary | ICD-10-CM

## 2024-07-30 DIAGNOSIS — M542 Cervicalgia: Secondary | ICD-10-CM

## 2024-07-30 DIAGNOSIS — M67951 Unspecified disorder of synovium and tendon, right thigh: Secondary | ICD-10-CM | POA: Insufficient documentation

## 2024-07-30 NOTE — Therapy (Signed)
 OUTPATIENT PHYSICAL THERAPY CERVICAL/SHOULDER PROGRESS NOTE   Patient Name: Molly Giles MRN: [id number] DOB:[birth year], 68 y.o., female Today's Date: 07/30/2024  END OF SESSION:  PT End of Session - 07/30/24 1143     Visit Number 4    Date for Recertification  08/20/24    Authorization Type medicare and BCBS supplemental ABN signed for November for DN    Progress Note Due on Visit 10    PT Start Time 1145    PT Stop Time 1225    PT Time Calculation (min) 40 min    Activity Tolerance Patient tolerated treatment well          Past Medical History:  Diagnosis Date   Abdominal bloating 05/10/2021   Abdominal discomfort, generalized 05/07/2016   Acquired hallux rigidus of right foot 09/15/2022   Acute gastritis 11/19/2020   Allergic rhinoconjunctivitis 07/02/2015   Anal fissure 11/19/2020   Anterior to posterior tear of superior glenoid labrum of left shoulder 02/22/2021   Arthritis    Arthritis of carpometacarpal (CMC) joint of left thumb 09/07/2023   Aseptic loosening of prosthetic hip, initial encounter 11/29/2017   Benign essential hypertension 10/28/2012   Benign positional vertigo 04/2015   Responded well to [facility]   Bruxism (teeth grinding)    Cervical spondylosis 11/18/2018   Chronic contact dermatitis 08/06/2018   Per allergist Dr Almarie Scala.    Chronic interstitial cystitis 11/09/2011   Managed by [facility] urology branch in [place] hydrodistention 5 years ago with Dr Claudene in [place], has relief of symptoms until now     Chronic low back pain 05/14/2019   Chronic migraine 02/25/2016   Per Dr Ines review in notes from [facility] from September 2015. Showed total headache days last month 18. Severe headache days 7 days. Moderate headache days 5 days. Mild headache days last month sick days. Days without headache last month 10 days. Symptoms associated with photophobia, phonophobia, osmophobia, neck pain, dizziness, jaw pain, nasal  congestion, vision disturbances, tingling and numbness, weakness and worsening with activity. Each headache attack last 3 hours depending on treatment in severity. Left side, the right side, easier side, the frontal area in the back of the head. Characterized as throbbing, pressure, tightness, squeezing, stabbing and burning    Chronic migraine without aura 05/15/2013    [facility]   Chronic tonsillitis 01/27/2019   DDD (degenerative disc disease), cervical 11/18/2018   DDD (degenerative disc disease), lumbar    Depression    Disc displacement, lumbar    Dysphagia 10/11/2021   Dysrhythmia    seen by dr Blanca- not a problem since she has been on Bystolic    Epiretinal membrane (ERM) of left eye 10/17/2021   Periodic ophthalmological monitoring     Episodic cluster headache, not intractable 03/06/2017   Essential hypertension 05/15/2013   Family history of adverse reaction to anesthesia    Brother- N/V   Family history of premature CAD 05/15/2013   Fibromyalgia 10/01/2013   Management by Dr GORMAN. Devashwar (Rhe[facility])     Fibromyalgia syndrome 07/01/2015   Management by Dr GORMAN. Devashwar ([facility])    GERD (gastroesophageal reflux disease) 12/16/2014   H/O seasonal allergies    Hallux rigidus of right foot 09/25/2022   Hammer toe    Left great toe   Hashimoto's thyroiditis    Per patient, diagnosed by Dr. Sharyne Pacini   Hearing loss of both ears 07/27/2015   mild to borderline moderate low frequency hearing loss  improving to within normal limits bilaterally on audiology testing at [facility] in November 2016.     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   Hyperlipidemia 1998   Hypokalemia due to excessive gastrointestinal loss of potassium 07/28/2019   Hypothyroidism    Iliopsoas bursitis of left hip 05/08/2022   Impairment of balance 02/2015   Consequent of postconcussive syndrome   Injury of triangular fibrocartilage complex (TFCC) 02/25/2019   Injury of triangular fibrocartilage complex of left wrist 02/25/2019   Dx 02/25/19 Prentice Pagan IV MD ([facility])   Insulin  resistance 07/04/2017   Internal hemorrhoid 01/28/2021   Internal hemorrhoid seen on colonoscopy 10/2020 REGINOLD Sol MD [facility])   Interstitial cystitis    Irritable bowel syndrome with diarrhea 04/03/2016   Keratoconjunctivitis sicca 11/18/2018   (+) ANA     Left ventricular hypertrophy, mild 02/25/2016   ECHOcardiogram report 06/17/15 showing EF55-60%, mild LVH and G1DD    Loose total hip arthroplasty 03/10/2018   [facility]   Low serum vitamin B12 01/10/2023   Lumbar facet joint pain    Meibomian gland dysfunction (MGD) of both eyes 10/17/2021   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   Metatarsalgia of both feet 07/31/2022   Mood disorder    Morbid obesity (HCC) 03/06/2017   Morton's neuroma of right foot 09/15/2022   Musculoskeletal neck pain 07/14/2015   Nocturnal hypoxemia 03/06/2017   Normal coronary arteries 05/14/2014   Nuclear sclerosis of both eyes 10/17/2021   OAB (overactive bladder) 03/02/2021   Formatting of this note might be different from the original.  Added automatically from request for surgery [id number]     Formatting of this note might be different from the original.  Added automatically from request for surgery [id number]     Obesity (BMI 30.0-34.9) 01/29/2017   Odynophagia 12/20/2021   OSA (obstructive sleep apnea) 12/27/2022   Dr Dohmeier polysomnography titration study 04/27/24  There was no clinically significant apnea noted under 5 cm water  CPAP pressure  with an AHI of 0.7/h.  What was present was hypoxia- not severe, and loud snoring.   CMS AHI was 0/h under 6 cm water  CPAP which reduced the snoring somewhat and reduced hypoxia. Higher CPAP pressures induced more apneas and hypopneas.  The weight loss has reduced the   Osteoarthritis of acromioclavicular joint 02/22/2021   Osteoarthritis of left hip 11/01/2015   MRI order by Dr Ernie (ortho) 10/2015 showed significant arthritis of left hip joint with cystic changes in femoral head c/w osteoarthritis   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, lumbar region 10/30/2011   Other insomnia 11/09/2016   Overweight    Pain in joint of left shoulder 11/09/2016   Pain in joint, multiple sites 11/18/2018   Palpitations 05/15/2013   [facility] manages   Perianal candidiasis 12/20/2021   Periodic limb movement sleep disorder 03/28/2017   Perirectal cyst 05/07/2016   Plantar fasciitis of right foot 11/10/2022   Poison ivy dermatitis 03/24/2021   PONV (postoperative nausea and vomiting)    Positive ANA (antinuclear antibody) 11/18/2018   Post concussion syndrome 06/06/2015   Post concussive syndrome 07/14/2015  Ms Kagel's post-concussive syndrome manifesting in vertigo and headache, mood changes, poor balance, dizziness, and decreased concentration per Dr Ines at [facility].    Posterior vitreous detachment of right eye 2014   Premature menopause 12/20/2021   Primary osteoarthritis of left knee 06/20/2018   Psychophysiological insomnia 11/07/2022   Pure hypercholesterolemia 05/15/2013   Rectal abscess 05/10/2021   S/P left THA, AA 07/25/2016   S/P revision of total hip 04/10/2018   S/P tendon repair 01/23/2024   Sacroiliac inflammation 08/18/2021   Severe obstructive sleep apnea-hypopnea syndrome 12/22/2022   Shingles    Shortness of breath dyspnea    with exertion   Sicca syndrome 11/18/2018   (+) ANA   Sjogren syndrome 01/27/2019   Sjogren's syndrome  01/27/2019   Sleep walking and eating 03/06/2017   Snoring 03/06/2017   Spondylosis of lumbar region without myelopathy or radiculopathy 10/30/2011   Status post total hip replacement, left 09/20/2017   TFC (triangular fibrocartilage complex) injury 02/25/2019   Thyroid  nodule 08/11/2009   Findings: The thyroid  gland is within normal limits in size.  The gland is diffusely inhomogeneous. A small solid nodule is noted in the lower pole  medially on the right of 7 x 6 x 8 mm. A small solid nodule is noted inferiorly on the left of 3 x 3 x 4 mm.  IMPRESSION:  The thyroid  gland is within normal limits in size with only small solid nodules present, the largest of only 8 mm in diameter on the right.     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    Trochanteric bursitis, right hip 04/26/2020   Tubular adenoma of colon 01/28/2021   Colonoscopy screening 4 mm tubular Adenoma polyp Marilu Sol, MD [facility])   Tubular adenoma of colon 01/28/2021   Colonoscopy screening 4 mm tubular Adenoma polyp Marilu Sol, MD [facility])   Vasomotor symptoms due to menopause 04/19/2017   Vertigo 03/19/2023   Vitamin D  deficiency 05/08/2017   Vulvitis 07/22/2020   Yeast vaginitis 07/11/2019   Past Surgical History:  Procedure Laterality Date   arthroscopy Left 01/2022   with SAD and DCR, K. Supple MD   Bladder dilitation     x 3   BREAST BIOPSY Right 2011   Benign histology   CARDIOVASCULAR STRESS TEST  2000   Unremarkable per pt report   CARPOMETACARPAL JOINT ARTHROTOMY Right 2011   COLONOSCOPY     COLONOSCOPY WITH PROPOFOL  N/A 04/21/2015   Procedure: COLONOSCOPY WITH PROPOFOL ;  Surgeon: Belvie Just, MD;  Location: [facility];  Service: Endoscopy;  Laterality: N/A;   CYSTOSCOPY W/ DILATION OF BLADDER N/A    EPIDURAL BLOCK INJECTION Left 04/12/2016   Left Medial Nerve Block and Left L5 ramus block, Dr Charlie Dolores    EPIDURAL BLOCK  INJECTION  03/21/2016   Left L3-4 medial branch block and Left L5 & dorsal ramus block    EPIDURAL BLOCK INJECTION N/A 10/25/2016   Charlie Dolores, MD. Lumbar medial branch block   EPIDURAL BLOCK INJECTION N/A 02/09/2017   Charlie Dolores, MD.  Bilateral L3/4 medial branch block, bilateral L5 dorsal ramus block   EPIDURAL BLOCK INJECTION N/A 07/04/2017   Charlie Dolores, MD   EXCISION MORTON'S NEUROMA Right 05/03/2023   Procedure: EXCISION MORTON'S NEUROMA;  Surgeon: Kit Rush, MD;  Location: [facility];  Service: Orthopedics;  Laterality: Right;  general, local by surgeon 60 MIN   FECAL TRANSPLANT  04/21/2015   Procedure: FECAL TRANSPLANT;  Surgeon: Belvie Just, MD;  Location: [facility];  Service: Endoscopy;;   HIP ADDUCTOR TENOTOMY  01/2024   HIP ARTHROPLASTY Left    HIP ARTHROSCOPY Left 03/06/2018   Left hip arthroplasty, redo for loose hip arthroplasty. Procedure at [facility] hospital   INJECTION HIP INTRA ARTICULAR Left 11/2015   for OA by Dr Charlie Bonner REDBIRD IMPLANT PLACEMENT  04/2021   Norleen Sharps MD ([facility])   INTERSTIM IMPLANT REVISION N/A 09/2021   Norleen Sharps MD ([facility])   LIGAMENT REPAIR Right 05/03/2023   Procedure: COLLATERAL LIGAMENT REPAIR;  Surgeon: Kit Norleen, MD;  Location: [facility];  Service: Orthopedics;  Laterality: Right;  general, local by surgeon 60 MIN   OTHER SURGICAL HISTORY Left 2016   Left L3/L4 medial nerve block and Left L5 Dorsal Ramus block Dr FABIENE Bonner   TOTAL HIP ARTHROPLASTY Left 07/25/2016   Procedure: LEFT TOTAL HIP ARTHROPLASTY ANTERIOR APPROACH;  Surgeon: Donnice Car, MD;  Location: [facility];  Service: Orthopedics;  Laterality: Left;   WEIL OSTEOTOMY Right 05/03/2023   Procedure: RIGHT 2ND WEIL OSTEOTOMY;  Surgeon: Kit Norleen, MD;  Location: [facility];  Service: Orthopedics;  Laterality: Right;  general, local by surgeon 60 MIN   Patient Active Problem  List   Diagnosis Date Noted   Chronic pain of left upper extremity 02/19/2024   Arthritis of carpometacarpal [facility]) joint of left thumb 09/07/2023   BMI 26.0-26.9,adult 02/07/2023   Low serum vitamin B12 01/10/2023   OSA (obstructive sleep apnea) 12/27/2022   Severe obstructive sleep apnea-hypopnea syndrome 12/22/2022   Hyperglycemia 12/11/2022   Generalized obesity 12/11/2022   SOBOE (shortness of breath on exertion) 12/11/2022   Other fatigue 12/11/2022   Sleep related headaches 11/07/2022   Intractable episodic cluster headache 11/07/2022   Psychophysiological insomnia 11/07/2022   Hallux rigidus of right foot 09/25/2022   Chronic pain of right knee 05/08/2022   Arthrosis of hand 12/20/2021   History of Clostridioides difficile colitis, required fecal transplantation 12/20/2021   Meibomian gland dysfunction (MGD) of both eyes 10/17/2021   Nuclear sclerosis of both eyes 10/17/2021   Epiretinal membrane (ERM) of left eye 10/17/2021   OAB (overactive bladder) 03/02/2021   Osteoarthritis of acromioclavicular joint 02/22/2021   Hyperhidrosis, scalp, 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   Chronic low back pain 05/14/2019   Sjogren syndrome 01/27/2019   DDD (degenerative disc disease), cervical 12/27/2018   Cervical spondylosis 11/18/2018   DDD (degenerative disc disease), lumbar 11/18/2018   Keratoconjunctivitis sicca 11/18/2018   Chronic contact dermatitis 08/06/2018   Primary osteoarthritis of left knee 06/20/2018   Insulin  resistance 07/04/2017   Irritable bowel syndrome with diarrhea 04/03/2016   Left ventricular hypertrophy, mild 02/25/2016   Allergic rhinoconjunctivitis 07/02/2015   History of colonic polyps 07/01/2015   GERD (gastroesophageal reflux disease) 12/16/2014   Fibromyalgia 10/01/2013   Mood disorder 10/01/2013   Hyperlipidemia 10/01/2013   Pure hypercholesterolemia 05/15/2013   Chronic migraine without aura 05/15/2013   Benign essential hypertension 10/28/2012    Hypothyroidism 11/09/2011   Chronic interstitial cystitis 11/09/2011    PCP: McDiarmid, Krystal MD  REFERRING PROVIDER:Supple, Franky MD  REFERRING DIAG: M25  THERAPY DIAG:  Cervicalgia  Chronic left shoulder pain  Muscle weakness (generalized)  Rationale for Evaluation and Treatment: Rehabilitation  ONSET DATE: chronic issue, worsened recently during and following surgery for hip  SUBJECTIVE:  SUBJECTIVE STATEMENT: Had a bad cold last week.  Feel it on the left side when I tilt my head back.  The doctor says it's a pinched nerve.  The headaches are better. The DN has helped.  Some improvement is short and some longer lasting.  Using foam roll at home.    Hand dominance: Right  PERTINENT HISTORY:  History of PT for right hip pain and glute med tendon repair HTN, hypercholesterolemia, fibromyalgia (diagnosed decades ago), migraines usually on right side 3-4x/week gets an infusion every few months, arthritis, concussion  Left thumb bone spur Bout of vertigo 2024 summer Has 2# weights and red band but doesn't use, has to be careful to avoid flare up  PAIN:   Are you having pain? Yes NPRS scale: 4/10 Pain location: left neck and right neck Pain orientation: Left  PAIN TYPE: aching, burning, and throbbing Pain description: constant  Aggravating factors: stiff with driving, looking up, moving head, raising left arm Relieving factors: minimizing activity level, migraine med   PRECAUTIONS: Other: limit volume of ex secondary to fibromyalgia   WEIGHT BEARING RESTRICTIONS: No  FALLS:  Has patient fallen in last 6 months? No  PLOF: Independent  PATIENT GOALS: stop that nerve irritation; lower the pain  NEXT MD VISIT: after PT  OBJECTIVE:  Note: Objective measures were  completed at Evaluation unless otherwise noted.  DIAGNOSTIC FINDINGS:  Mild; FINDINGS: Alignment: Normal. There is no significant antero or retrolisthesis. There is no jumped or perched facet.   Skull base and vertebrae: Skull base alignment is maintained. Vertebral body heights are preserved. There is no evidence of acute injury. There is no suspicious osseous lesion.   Soft tissues and spinal canal: No prevertebral fluid or swelling. No visible canal hematoma.   Disc levels: There is mild intervertebral disc space narrowing with mild associated uncovertebral arthropathy at C5-C6. There is overall minimal facet arthropathy throughout the cervical spine. There is up to mild left neural foraminal stenosis at C5-C6. There is no other significant spinal canal or neural foraminal stenosis.   Upper chest: The imaged lung apices are clear.   Other: None.   IMPRESSION: Minimal degenerative changes at C5-C6 resulting in mild left neural foraminal stenosis. Otherwise, unremarkable cervical spine CT with no other significant spinal canal or neural foraminal stenosis.    PATIENT SURVEYS:  NDI:  NECK DISABILITY INDEX  Date: 10/15 Score  Pain intensity 3 = The pain is fairly severe at the moment  2. Personal care (washing, dressing, etc.) 0 = I can look after myself normally without causing extra pain  3. Lifting 3 = Pain prevents me from lifting heavy weights but I can manage light to medium   weights if they are conveniently positioned  4. Reading 2 =  I can read as much as I want with moderate pain in my neck  5. Headaches 3 = I have moderate headaches, which come frequently  6. Concentration 1 =  I can concentrate fully when I want to with slight difficulty   7. Work 3 =  I cannot do my usual work  8. Driving 2 =  I can drive my car as long as I want with moderate pain in my neck  9. Sleeping 1 = My sleep is slightly disturbed (less than 1 hr sleepless)  10. Recreation 1 =  I am  able to engage in all my recreation activities, with some pain in my neck  Total 38%   Minimum Detectable Change (  90% confidence): 5 points or 10% points  COGNITION: Overall cognitive status: Within functional limits for tasks assessed  PALPATION: Tender points left upper traps, left paraspinals, bil suboccipitals   CERVICAL ROM:   Active ROM A/PROM (deg) eval  Flexion 55  Extension 45 pain  Right lateral flexion 42 pain  Left lateral flexion 32  Right rotation 30  Left rotation 30   (Blank rows = not tested)  UPPER EXTREMITY ROM:  Active ROM Right eval Left eval  Shoulder flexion 155 150 pain  Shoulder extension    Shoulder abduction 165 160 pain  Shoulder adduction    Shoulder extension    Shoulder internal rotation T2 T8  Shoulder external rotation C7 C7  Elbow flexion    Elbow extension    Wrist flexion    Wrist extension    Wrist ulnar deviation    Wrist radial deviation    Wrist pronation    Wrist supination     (Blank rows = not tested)  UPPER EXTREMITY MMT:  MMT Right eval Left eval 11/19  Shoulder flexion 5 4- 4  Shoulder extension     Shoulder abduction 5 4- 4  Shoulder adduction     Shoulder extension     Shoulder internal rotation 5 4 4   Shoulder external rotation 5 4 4   Middle trapezius 4 4-   Lower trapezius 4 4-   Elbow flexion     Elbow extension     Wrist flexion     Wrist extension     Wrist ulnar deviation     Wrist radial deviation     Wrist pronation     Wrist supination     Grip strength      (Blank rows = not tested)  CERVICAL SPECIAL TESTS:  No effect with cervical distraction; +left shoulder painful arc; + Vonzell Berber  TREATMENT DATE: 07/30/24 supine thoracic extension with foam roll 10x MELT method cervical foam roll: rotation, circles, fig 8, nods 5x each 2 rounds with verbal cues for technique Review of HEP and discussion of volume and areas of focus Trigger Point Dry Needling Subsequent Treatment: Pt  instructed on Dry Needling rational, procedures, and possible side effects. Pt instructed to expect mild to moderate muscle soreness later in the day and/or into the next day.  Pt instructed in methods to reduce muscle soreness. Pt instructed to continue prescribed HEP. Patient verbalized understanding of these instructions and education.  Patient Verbal Consent Given: Yes Education Handout Provided: Yes Muscles Treated: bil cervical multifidi, bil suboccipitals;  left upper trap; left levator scap Electrical Stimulation Performed: No Treatment Response/Outcome: improved soft tissue mobility  TREATMENT DATE: 07/17/24 Seated thoracic extension with foam roll 10x Wall foam roll ups with Ues 5x Back to wall with foam vertically with open books, UE elevation, snow angels (cues for thumb up position with abduction due to shoulder discomfort) 5 reps each Karolynn pose with foam roll uncomfortable on shoulder discontinued at 3 reps MELT method cervical foam roll: rotation, circles, fig 8, nods 5x each Trigger Point Dry Needling Subsequent Treatment: Pt instructed on Dry Needling rational, procedures, and possible side effects. Pt instructed to expect mild to moderate muscle soreness later in the day and/or into the next day.  Pt instructed in methods to reduce muscle soreness. Pt instructed to continue prescribed HEP. Patient verbalized understanding of these instructions and education.  Patient Verbal Consent Given: Yes Education Handout Provided: Yes Muscles Treated: bil cervical multifidi, bil suboccipitals;  left upper  trap Electrical Stimulation Performed: No Treatment Response/Outcome: improved soft tissue mobility  TREATMENT DATE: 07/01/24 Discussion of response to treatment and plan for session MELT method cervical foam roll: rotation, circles, fig 8, nods 5x each Seated upper trap and levator scap stretching Seated scap retraction Trigger Point Dry Needling Initial Treatment: Pt  instructed on Dry Needling rational, procedures, and possible side effects. Pt instructed to expect mild to moderate muscle soreness later in the day and/or into the next day.  Pt instructed in methods to reduce muscle soreness. Pt instructed to continue prescribed HEP. Patient verbalized understanding of these instructions and education.  Patient Verbal Consent Given: Yes Education Handout Provided: Yes Muscles Treated: left only cervical multifidi, left upper trap Electrical Stimulation Performed: No Treatment Response/Outcome: improved soft tissue mobility     PATIENT EDUCATION:  Education details: Educated patient on anatomy and physiology of current symptoms, prognosis, plan of care as well as initial self care strategies to promote recovery Person educated: Patient Education method: Explanation Education comprehension: verbalized understanding  HOME EXERCISE PROGRAM: MELT method with foam roll ASSESSMENT:  CLINICAL IMPRESSION: Min verbal cues for technique with foam roll Melt method including smaller ROM to avoid soreness/flare up.  Good response to supine thoracic extension with foam roll.  The patient benefits significantly from dry needling and manual therapy to stimulate underlying myofascial trigger points and muscular tissue for management of neuromusculoskeletal pain and address movement impairments.  Much improved soft tissue mobility and decreased tender point size and number following treatment session.      OBJECTIVE IMPAIRMENTS: decreased activity tolerance, decreased ROM, decreased strength, increased fascial restrictions, impaired perceived functional ability, impaired UE functional use, and pain.   ACTIVITY LIMITATIONS: carrying, lifting, sleeping, and reach over head  PARTICIPATION LIMITATIONS: cleaning, driving, and shopping  PERSONAL FACTORS: Time since onset of injury/illness/exacerbation and 3+ comorbidities: migraines, fibromyalgia, recent surgery are also  affecting patient's functional outcome.   REHAB POTENTIAL: Good  CLINICAL DECISION MAKING: Evolving/moderate complexity  EVALUATION COMPLEXITY: Moderate   GOALS: Goals reviewed with patient? Yes  SHORT TERM GOALS: Target date: 07/23/2024   The patient will demonstrate knowledge of basic self care strategies and exercises to promote healing  Baseline:  Goal status: met 11/19  2.  Improved cervical rotation ROM to 35 degrees and sidebending to 45 degrees needed for driving Baseline:  Goal status: ongoing  3.  The patient will report a 30% improvement in pain levels with functional activities which are currently difficult including carrying items and driving Baseline:  Goal status: ongoing  4.  The patient will have grossly 4/5 strength in UE and periscapular muscles needed to carry items in left arm  Baseline:  Goal status: met 11/19     LONG TERM GOALS: Target date: 08/20/2024    The patient will be independent in a safe self progression of a home exercise program to promote further recovery of function  Baseline:  Goal status: INITIAL  2.  The patient will report a 60% improvement in pain levels with functional activities which are currently difficult including carrying items and driving Baseline:  Goal status: INITIAL  3.  The patient will have grossly 4+/5 strength needed to lift and lower a 3# object from a high shelf  Baseline:  Goal status: INITIAL  4.   Improved cervical rotation ROM to 40 degrees and sidebending to 50 degrees needed for driving Baseline:  Goal status: INITIAL  5.  Neck Disability Index improved to 28% indicating improved function with less  pain Baseline:  Goal status: INITIAL    PLAN:  PT FREQUENCY: 1x/week  PT DURATION: 8 weeks  PLANNED INTERVENTIONS: 02835- PT Re-evaluation, 97750- Physical Performance Testing, 97110-Therapeutic exercises, 97530- Therapeutic activity, 97112- Neuromuscular re-education, 97535- Self Care, 02859-  Manual therapy, [phone]- Aquatic Therapy, [phone]- Electrical stimulation (unattended), [phone]- Electrical stimulation (manual), [id number]- Ultrasound, [id number]- Ionotophoresis 4mg /ml Dexamethasone , 79439 (1-2 muscles), 20561 (3+ muscles)- Dry Needling, Patient/Family education, Taping, Joint mobilization, Spinal mobilization, Cryotherapy, and Moist heat  PLAN FOR NEXT SESSION: recheck cervical ROM and STGs; assess response to DN in moderation/marination bil cervical multifidi/suboccipitals, upper traps, add levator, rhomboids as tolerated; foam roll MELT and thoracic mobility; low reps and volume of exercise secondary to sensitivity due to fibromyalgia  Glade Pesa, PT 07/30/24 6:57 PM Phone: [phone] Fax: [phone]

## 2024-07-31 ENCOUNTER — Ambulatory Visit (INDEPENDENT_AMBULATORY_CARE_PROVIDER_SITE_OTHER): Payer: Self-pay | Admitting: Family Medicine

## 2024-07-31 ENCOUNTER — Other Ambulatory Visit (HOSPITAL_COMMUNITY): Payer: Self-pay

## 2024-07-31 ENCOUNTER — Encounter (INDEPENDENT_AMBULATORY_CARE_PROVIDER_SITE_OTHER): Payer: Self-pay | Admitting: Family Medicine

## 2024-07-31 VITALS — BP 96/68 | HR 82 | Temp 97.9°F | Ht 66.0 in | Wt 162.0 lb

## 2024-07-31 DIAGNOSIS — E669 Obesity, unspecified: Secondary | ICD-10-CM

## 2024-07-31 DIAGNOSIS — E782 Mixed hyperlipidemia: Secondary | ICD-10-CM

## 2024-07-31 DIAGNOSIS — Z6826 Body mass index (BMI) 26.0-26.9, adult: Secondary | ICD-10-CM

## 2024-07-31 DIAGNOSIS — E538 Deficiency of other specified B group vitamins: Secondary | ICD-10-CM

## 2024-07-31 DIAGNOSIS — E7800 Pure hypercholesterolemia, unspecified: Secondary | ICD-10-CM

## 2024-07-31 DIAGNOSIS — E559 Vitamin D deficiency, unspecified: Secondary | ICD-10-CM | POA: Diagnosis not present

## 2024-07-31 DIAGNOSIS — G4733 Obstructive sleep apnea (adult) (pediatric): Secondary | ICD-10-CM | POA: Diagnosis not present

## 2024-07-31 MED ORDER — ZEPBOUND 10 MG/0.5ML ~~LOC~~ SOAJ
10.0000 mg | SUBCUTANEOUS | 0 refills | Status: DC
  Filled 2024-07-31: qty 2, 28d supply, fill #0

## 2024-07-31 NOTE — Progress Notes (Signed)
 Office: [phone]  /  Fax: [phone]  WEIGHT SUMMARY AND BIOMETRICS  Anthropometric Measurements Height: 5' 6 (1.676 m) Weight: 162 lb (73.5 kg) BMI (Calculated): 26.16 Weight at Last Visit: 162 lb Weight Lost Since Last Visit: 0 Weight Gained Since Last Visit: 0 Starting Weight: 199 lb Total Weight Loss (lbs): 37 lb (16.8 kg) Peak Weight: 226 lb   Body Composition  Body Fat %: 40.1 % Fat Mass (lbs): 65 lbs Muscle Mass (lbs): 92.2 lbs Total Body Water  (lbs): 67.6 lbs Visceral Fat Rating : 10   Other Clinical Data Fasting: yes Labs: no Today's Visit #: 23 Starting Date: 12/11/22    Chief Complaint: OBESITY    History of Present Illness Molly Giles is a 68 year old female who presents for a follow-up on her obesity treatment and progress.  She is adhering to the category one eating plan about fifty percent of the time, focusing on increasing protein intake and consuming more fruits and vegetables. She occasionally skips meals and is trying to stay hydrated, although she has not consumed much water  today. She tracks her protein intake, averaging between 66 and 88 grams per day, with a goal of reaching 75 to 100 grams. She consumes a 'one bar' daily, which provides 18 grams of protein and 240 calories.  She exercises seven days a week for about thirty minutes, primarily stretching. Her weight has remained stable over the past month since her last visit.  Her blood pressure readings today were 88/60 and 96/68. She is not on any antihypertensive medications but is taking minoxidil 2.5 mg. No lightheadedness or dizziness.  She is on Zepbound  7.5 mg for obstructive sleep apnea. She reports not losing weight.  Her recent lab work showed good results for electrolytes, glucose, kidney function, liver enzymes, and insulin  levels. However, her LDL cholesterol has slightly increased, and her B12 levels are slowly improving. She reports a family history of high  cholesterol.      PHYSICAL EXAM:  Blood pressure 96/68, pulse 82, temperature 97.9 F (36.6 C), height 5' 6 (1.676 m), weight 162 lb (73.5 kg), SpO2 97%. Body mass index is 26.15 kg/m.  DIAGNOSTIC DATA REVIEWED:  BMET    Component Value Date/Time   NA 140 07/03/2024 1102   K 4.2 07/03/2024 1102   CL 105 07/03/2024 1102   CO2 22 07/03/2024 1102   GLUCOSE 73 07/03/2024 1102   GLUCOSE 93 07/05/2019 1629   BUN 17 07/03/2024 1102   CREATININE 0.82 07/03/2024 1102   CREATININE 0.74 02/24/2016 1158   CALCIUM  9.2 07/03/2024 1102   GFRNONAA 65 07/24/2019 1116   GFRNONAA 88 02/24/2016 1158   GFRAA 75 07/24/2019 1116   GFRAA >89 02/24/2016 1158   Lab Results  Component Value Date   HGBA1C 5.2 09/10/2023   HGBA1C 5.3 03/22/2017   Lab Results  Component Value Date   INSULIN  9.5 07/03/2024   INSULIN  10.5 03/22/2017   Lab Results  Component Value Date   TSH 2.290 10/18/2023   CBC    Component Value Date/Time   WBC 6.0 07/03/2024 1102   WBC 6.6 07/05/2019 1629   RBC 4.37 07/03/2024 1102   RBC 4.26 08/04/2020 0000   HGB 13.4 07/03/2024 1102   HCT 41.5 07/03/2024 1102   PLT 244 07/03/2024 1102   MCV 95 07/03/2024 1102   MCH 30.7 07/03/2024 1102   MCH 30.0 07/05/2019 1629   MCHC 32.3 07/03/2024 1102   MCHC 31.6 07/05/2019 1629   RDW  13.1 07/03/2024 1102   Iron  Studies No results found for: IRON , TIBC, FERRITIN, IRONPCTSAT Lipid Panel     Component Value Date/Time   CHOL 171 07/03/2024 1102   TRIG 77 07/03/2024 1102   HDL 53 07/03/2024 1102   CHOLHDL 3.3 10/11/2021 1142   CHOLHDL 3.9 12/14/2014 1036   VLDL 26 12/14/2014 1036   LDLCALC 103 (H) 07/03/2024 1102   Hepatic Function Panel     Component Value Date/Time   PROT 6.5 07/03/2024 1102   ALBUMIN 4.5 07/03/2024 1102   AST 21 07/03/2024 1102   ALT 13 07/03/2024 1102   ALKPHOS 104 07/03/2024 1102   BILITOT 0.4 07/03/2024 1102   BILIDIR 0.14 02/12/2020 1056      Component Value Date/Time    TSH 2.290 10/18/2023 1212   Nutritional Lab Results  Component Value Date   VD25OH 54.4 07/03/2024   VD25OH 103.0 (H) 09/10/2023   VD25OH 41.2 12/11/2022     Assessment and Plan Assessment & Plan Obesity -improving Management is ongoing with a focus on dietary modifications and exercise. She is following the category one eating plan about 50% of the time, attempting to increase protein intake, and exercising regularly. Weight has been stable over the last month. Current medication includes Zepbound  7.5 mg, which is well-tolerated but not resulting in weight loss. Discussion about increasing the dose to 10 mg to enhance weight loss, especially before the holidays. Emphasis on maintaining hydration and managing meal skipping. - Increased Zepbound  to 10 mg daily. - Encouraged hydration and regular meal intake. - Continue current exercise regimen. - Monitor weight and dietary intake.  Obstructive sleep apnea Managed with Zepbound , which is well-tolerated. No new issues reported. - Continue Zepbound  for obstructive sleep apnea.  Elevated LDL cholesterol LDL cholesterol has increased slightly, likely due to genetic factors and dietary influences. Current levels are not significantly concerning, but dietary management is advised to prevent further increase. - Continue monitoring LDL cholesterol levels. - Advised on dietary modifications to manage LDL levels.  Vitamin B12 deficiency Vitamin B12 levels are slowly improving but remain below the desired range. Symptoms of fatigue may be related to low B12 levels. Dietary sources of B12 include meat and fish. - Recommended B12 supplementation at 1000 mcg daily.  Vitamin D  deficiency, controlled Vitamin D  levels have normalized after previous supplementation. No current supplementation is being taken. - Recommended over-the-counter vitamin D  supplementation at 1000-2000 IU daily.      Patients who are on anti-obesity medications are  counseled on the importance of maintaining healthy lifestyle habits, including balanced nutrition, regular physical activity, and behavioral modifications,  Medication is an adjunct to, not a replacement for, lifestyle changes and that the long-term success and weight maintenance depend on continued adherence to these strategies.   Evian was informed of the importance of frequent follow up visits to maximize her success with intensive lifestyle modifications for her obesity and obesity related health conditions as recommended by USPSTF and CMS guidelines  Louann Penton, MD

## 2024-08-05 ENCOUNTER — Ambulatory Visit: Admitting: Physical Therapy

## 2024-08-05 ENCOUNTER — Encounter: Payer: Self-pay | Admitting: Physical Therapy

## 2024-08-05 DIAGNOSIS — M6281 Muscle weakness (generalized): Secondary | ICD-10-CM

## 2024-08-05 DIAGNOSIS — G8929 Other chronic pain: Secondary | ICD-10-CM

## 2024-08-05 DIAGNOSIS — M542 Cervicalgia: Secondary | ICD-10-CM

## 2024-08-05 DIAGNOSIS — R2689 Other abnormalities of gait and mobility: Secondary | ICD-10-CM

## 2024-08-05 NOTE — Patient Instructions (Signed)
 TENS UNIT  This is helpful for muscle pain and spasm.   Search and Purchase a TENS 7000 2nd edition at www.tenspros.com or www.amazon.com  (It should be less than $30)     TENS unit instructions:   Do not shower or bathe with the unit on  Turn the unit off before removing electrodes or batteries  If the electrodes lose stickiness add a drop of water to the electrodes after they are disconnected from the unit and place on plastic sheet. If you continued to have difficulty, call the TENS unit company to purchase more electrodes.  Do not apply lotion on the skin area prior to use. Make sure the skin is clean and dry as this will help prolong the life of the electrodes.  After use, always check skin for unusual red areas, rash or other skin difficulties. If there are any skin problems, does not apply electrodes to the same area.  Never remove the electrodes from the unit by pulling the wires.  Do not use the TENS unit or electrodes other than as directed.  Do not change electrode placement without consulting your therapist or physician.  Keep 2 fingers with between each electrode.

## 2024-08-05 NOTE — Therapy (Signed)
 OUTPATIENT PHYSICAL THERAPY CERVICAL/SHOULDER PROGRESS NOTE   Patient Name: Molly Giles MRN: [id number] DOB:02/24/1956, 68 y.o., female Today's Date: 08/05/2024  END OF SESSION:  PT End of Session - 08/05/24 1100     Visit Number 5    Date for Recertification  08/20/24    Authorization Type medicare and BC[employer] supplemental ABN signed for November for DN    Progress Note Due on Visit 10    PT Start Time 1101    PT Stop Time 1143    PT Time Calculation (min) 42 min    Activity Tolerance Patient tolerated treatment well          Past Medical History:  Diagnosis Date   Abdominal bloating 05/10/2021   Abdominal discomfort, generalized 05/07/2016   Acquired hallux rigidus of right foot 09/15/2022   Acute gastritis 11/19/2020   Allergic rhinoconjunctivitis 07/02/2015   Anal fissure 11/19/2020   Anterior to posterior tear of superior glenoid labrum of left shoulder 02/22/2021   Arthritis    Arthritis of carpometacarpal (CMC) joint of left thumb 09/07/2023   Aseptic loosening of prosthetic hip, initial encounter 11/29/2017   Benign essential hypertension 10/28/2012   Benign positional vertigo 04/2015   Responded well to [facility]   Bruxism (teeth grinding)    Cervical spondylosis 11/18/2018   Chronic contact dermatitis 08/06/2018   Per allergist Dr Almarie Scala.    Chronic interstitial cystitis 11/09/2011   Managed by [facility] urology branch in [place] hydrodistention 5 years ago with Dr Claudene in [place], has relief of symptoms until now     Chronic low back pain 05/14/2019   Chronic migraine 02/25/2016   Per Dr Ines review in notes from [facility] from September 2015. Showed total headache days last month 18. Severe headache days 7 days. Moderate headache days 5 days. Mild headache days last month sick days. Days without headache last month 10 days. Symptoms associated with photophobia, phonophobia, osmophobia, neck pain, dizziness, jaw pain, nasal  congestion, vision disturbances, tingling and numbness, weakness and worsening with activity. Each headache attack last 3 hours depending on treatment in severity. Left side, the right side, easier side, the frontal area in the back of the head. Characterized as throbbing, pressure, tightness, squeezing, stabbing and burning    Chronic migraine without aura 05/15/2013    Dr Ines [facility]   Chronic tonsillitis 01/27/2019   DDD (degenerative disc disease), cervical 11/18/2018   DDD (degenerative disc disease), lumbar    Depression    Disc displacement, lumbar    Dysphagia 10/11/2021   Dysrhythmia    seen by dr Blanca- not a problem since she has been on Bystolic    Epiretinal membrane (ERM) of left eye 10/17/2021   Periodic ophthalmological monitoring     Episodic cluster headache, not intractable 03/06/2017   Essential hypertension 05/15/2013   Family history of adverse reaction to anesthesia    Brother- N/V   Family history of premature CAD 05/15/2013   Fibromyalgia 10/01/2013   Management by Dr GORMAN. Devashwar (Rhe[facility])     Fibromyalgia syndrome 07/01/2015   Management by Dr GORMAN. Devashwar ([facility])    GERD (gastroesophageal reflux disease) 12/16/2014   H/O seasonal allergies    Hallux rigidus of right foot 09/25/2022   Hammer toe    Left great toe   Hashimoto's thyroiditis    Per patient, diagnosed by Dr. Sharyne Pacini   Hearing loss of both ears 07/27/2015   mild to borderline moderate low frequency hearing loss  improving to within normal limits bilaterally on audiology testing at [facility] in November 2016.     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   Hyperlipidemia 1998   Hypokalemia due to excessive gastrointestinal loss of potassium 07/28/2019   Hypothyroidism    Iliopsoas bursitis of left hip 05/08/2022   Impairment of balance 02/2015   Consequent of postconcussive syndrome   Injury of triangular fibrocartilage complex (TFCC) 02/25/2019   Injury of triangular fibrocartilage complex of left wrist 02/25/2019   Dx 02/25/19 Prentice Pagan IV MD ([facility])   Insulin  resistance 07/04/2017   Internal hemorrhoid 01/28/2021   Internal hemorrhoid seen on colonoscopy 10/2020 REGINOLD Sol MD [facility])   Interstitial cystitis    Irritable bowel syndrome with diarrhea 04/03/2016   Keratoconjunctivitis sicca 11/18/2018   (+) ANA     Left ventricular hypertrophy, mild 02/25/2016   ECHOcardiogram report 06/17/15 showing EF55-60%, mild LVH and G1DD    Loose total hip arthroplasty 03/10/2018   [facility]   Low serum vitamin B12 01/10/2023   Lumbar facet joint pain    Meibomian gland dysfunction (MGD) of both eyes 10/17/2021   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   Metatarsalgia of both feet 07/31/2022   Mood disorder    Morbid obesity (HCC) 03/06/2017   Morton's neuroma of right foot 09/15/2022   Musculoskeletal neck pain 07/14/2015   Nocturnal hypoxemia 03/06/2017   Normal coronary arteries 05/14/2014   Nuclear sclerosis of both eyes 10/17/2021   OAB (overactive bladder) 03/02/2021   Formatting of this note might be different from the original.  Added automatically from request for surgery [id number]     Formatting of this note might be different from the original.  Added automatically from request for surgery [id number]     Obesity (BMI 30.0-34.9) 01/29/2017   Odynophagia 12/20/2021   OSA (obstructive sleep apnea) 12/27/2022   Dr Dohmeier polysomnography titration study 04/27/24  There was no clinically significant apnea noted under 5 cm water  CPAP pressure  with an AHI of 0.7/h.  What was present was hypoxia- not severe, and loud snoring.   CMS AHI was 0/h under 6 cm water  CPAP which reduced the snoring somewhat and reduced hypoxia. Higher CPAP pressures induced more apneas and hypopneas.  The weight loss has reduced the   Osteoarthritis of acromioclavicular joint 02/22/2021   Osteoarthritis of left hip 11/01/2015   MRI order by Dr Ernie (ortho) 10/2015 showed significant arthritis of left hip joint with cystic changes in femoral head c/w osteoarthritis   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, lumbar region 10/30/2011   Other insomnia 11/09/2016   Overweight    Pain in joint of left shoulder 11/09/2016   Pain in joint, multiple sites 11/18/2018   Palpitations 05/15/2013   [facility] manages   Perianal candidiasis 12/20/2021   Periodic limb movement sleep disorder 03/28/2017   Perirectal cyst 05/07/2016   Plantar fasciitis of right foot 11/10/2022   Poison ivy dermatitis 03/24/2021   PONV (postoperative nausea and vomiting)    Positive ANA (antinuclear antibody) 11/18/2018   Post concussion syndrome 06/06/2015   Post concussive syndrome 07/14/2015  Ms Touchton's post-concussive syndrome manifesting in vertigo and headache, mood changes, poor balance, dizziness, and decreased concentration per Dr Ines at [facility].    Posterior vitreous detachment of right eye 2014   Premature menopause 12/20/2021   Primary osteoarthritis of left knee 06/20/2018   Psychophysiological insomnia 11/07/2022   Pure hypercholesterolemia 05/15/2013   Rectal abscess 05/10/2021   S/P left THA, AA 07/25/2016   S/P revision of total hip 04/10/2018   S/P tendon repair 01/23/2024   Sacroiliac inflammation 08/18/2021   Severe obstructive sleep apnea-hypopnea syndrome 12/22/2022   Shingles    Shortness of breath dyspnea    with exertion   Sicca syndrome 11/18/2018   (+) ANA   Sjogren syndrome 01/27/2019   Sjogren's syndrome  01/27/2019   Sleep walking and eating 03/06/2017   Snoring 03/06/2017   Spondylosis of lumbar region without myelopathy or radiculopathy 10/30/2011   Status post total hip replacement, left 09/20/2017   TFC (triangular fibrocartilage complex) injury 02/25/2019   Thyroid  nodule 08/11/2009   Findings: The thyroid  gland is within normal limits in size.  The gland is diffusely inhomogeneous. A small solid nodule is noted in the lower pole  medially on the right of 7 x 6 x 8 mm. A small solid nodule is noted inferiorly on the left of 3 x 3 x 4 mm.  IMPRESSION:  The thyroid  gland is within normal limits in size with only small solid nodules present, the largest of only 8 mm in diameter on the right.     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    Trochanteric bursitis, right hip 04/26/2020   Tubular adenoma of colon 01/28/2021   Colonoscopy screening 4 mm tubular Adenoma polyp Marilu Sol, MD [facility])   Tubular adenoma of colon 01/28/2021   Colonoscopy screening 4 mm tubular Adenoma polyp Marilu Sol, MD [facility])   Vasomotor symptoms due to menopause 04/19/2017   Vertigo 03/19/2023   Vitamin D  deficiency 05/08/2017   Vulvitis 07/22/2020   Yeast vaginitis 07/11/2019   Past Surgical History:  Procedure Laterality Date   arthroscopy Left 01/2022   with SAD and DCR, K. Supple MD   Bladder dilitation     x 3   BREAST BIOPSY Right 2011   Benign histology   CARDIOVASCULAR STRESS TEST  2000   Unremarkable per pt report   CARPOMETACARPAL JOINT ARTHROTOMY Right 2011   COLONOSCOPY     COLONOSCOPY WITH PROPOFOL  N/A 04/21/2015   Procedure: COLONOSCOPY WITH PROPOFOL ;  Surgeon: Belvie Just, MD;  Location: [facility];  Service: Endoscopy;  Laterality: N/A;   CYSTOSCOPY W/ DILATION OF BLADDER N/A    EPIDURAL BLOCK INJECTION Left 04/12/2016   Left Medial Nerve Block and Left L5 ramus block, Dr Charlie Dolores    EPIDURAL BLOCK  INJECTION  03/21/2016   Left L3-4 medial branch block and Left L5 & dorsal ramus block    EPIDURAL BLOCK INJECTION N/A 10/25/2016   Charlie Dolores, MD. Lumbar medial branch block   EPIDURAL BLOCK INJECTION N/A 02/09/2017   Charlie Dolores, MD.  Bilateral L3/4 medial branch block, bilateral L5 dorsal ramus block   EPIDURAL BLOCK INJECTION N/A 07/04/2017   Charlie Dolores, MD   EXCISION MORTON'S NEUROMA Right 05/03/2023   Procedure: EXCISION MORTON'S NEUROMA;  Surgeon: Kit Rush, MD;  Location: [facility];  Service: Orthopedics;  Laterality: Right;  general, local by surgeon 60 MIN   FECAL TRANSPLANT  04/21/2015   Procedure: FECAL TRANSPLANT;  Surgeon: Belvie Just, MD;  Location: [facility] ENDOSCOPY;  Service: Endoscopy;;   HIP ADDUCTOR TENOTOMY  01/2024   HIP ARTHROPLASTY Left    HIP ARTHROSCOPY Left 03/06/2018   Left hip arthroplasty, redo for loose hip arthroplasty. Procedure at [facility]   INJECTION HIP INTRA ARTICULAR Left 11/2015   for OA by Dr Charlie Bonner REDBIRD IMPLANT PLACEMENT  04/2021   Norleen Sharps MD ([facility])   INTERSTIM IMPLANT REVISION N/A 09/2021   Norleen Sharps MD ([facility])   LIGAMENT REPAIR Right 05/03/2023   Procedure: COLLATERAL LIGAMENT REPAIR;  Surgeon: Kit Norleen, MD;  Location: [facility];  Service: Orthopedics;  Laterality: Right;  general, local by surgeon 60 MIN   OTHER SURGICAL HISTORY Left 2016   Left L3/L4 medial nerve block and Left L5 Dorsal Ramus block Dr FABIENE Bonner   TOTAL HIP ARTHROPLASTY Left 07/25/2016   Procedure: LEFT TOTAL HIP ARTHROPLASTY ANTERIOR APPROACH;  Surgeon: Donnice Car, MD;  Location: [facility];  Service: Orthopedics;  Laterality: Left;   WEIL OSTEOTOMY Right 05/03/2023   Procedure: RIGHT 2ND WEIL OSTEOTOMY;  Surgeon: Kit Norleen, MD;  Location: [facility];  Service: Orthopedics;  Laterality: Right;  general, local by surgeon 60 MIN   Patient Active Problem  List   Diagnosis Date Noted   Chronic pain of left upper extremity 02/19/2024   Arthritis of carpometacarpal [facility]) joint of left thumb 09/07/2023   BMI 26.0-26.9,adult 02/07/2023   B12 deficiency 01/10/2023   OSA (obstructive sleep apnea) 12/27/2022   Severe obstructive sleep apnea-hypopnea syndrome 12/22/2022   Hyperglycemia 12/11/2022   Generalized obesity 12/11/2022   SOBOE (shortness of breath on exertion) 12/11/2022   Other fatigue 12/11/2022   Sleep related headaches 11/07/2022   Intractable episodic cluster headache 11/07/2022   Psychophysiological insomnia 11/07/2022   Hallux rigidus of right foot 09/25/2022   Chronic pain of right knee 05/08/2022   Arthrosis of hand 12/20/2021   History of Clostridioides difficile colitis, required fecal transplantation 12/20/2021   Meibomian gland dysfunction (MGD) of both eyes 10/17/2021   Nuclear sclerosis of both eyes 10/17/2021   Epiretinal membrane (ERM) of left eye 10/17/2021   OAB (overactive bladder) 03/02/2021   Osteoarthritis of acromioclavicular joint 02/22/2021   Hyperhidrosis, scalp, 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   Chronic low back pain 05/14/2019   Sjogren syndrome 01/27/2019   DDD (degenerative disc disease), cervical 12/27/2018   Cervical spondylosis 11/18/2018   DDD (degenerative disc disease), lumbar 11/18/2018   Keratoconjunctivitis sicca 11/18/2018   Chronic contact dermatitis 08/06/2018   Primary osteoarthritis of left knee 06/20/2018   Insulin  resistance 07/04/2017   Vitamin D  deficiency 05/08/2017   Irritable bowel syndrome with diarrhea 04/03/2016   Left ventricular hypertrophy, mild 02/25/2016   Allergic rhinoconjunctivitis 07/02/2015   History of colonic polyps 07/01/2015   GERD (gastroesophageal reflux disease) 12/16/2014   Fibromyalgia 10/01/2013   Mood disorder 10/01/2013   Hyperlipidemia 10/01/2013   Pure hypercholesterolemia 05/15/2013   Chronic migraine without aura 05/15/2013   Benign essential hypertension  10/28/2012   Hypothyroidism 11/09/2011   Chronic interstitial cystitis 11/09/2011    PCP: McDiarmid, Todd MD  REFERRING PROVIDER:Supple, Franky MD  REFERRING DIAG: M25  THERAPY DIAG:  Cervicalgia  Chronic left shoulder pain  Muscle weakness (generalized)  Other abnormalities of gait and mobility  Rationale for Evaluation and Treatment: Rehabilitation  ONSET DATE: chronic issue, worsened recently during and following surgery for hip  SUBJECTIVE:                                                                                                                                                                                                         SUBJECTIVE STATEMENT: I'm hurting.  I didn't fair well from the DN last time.  I couldn't sleep on my side.  Very stiff/sore neck. I don't want to do DN this week.     Hand dominance: Right  PERTINENT HISTORY:  History of PT for right hip pain and glute med tendon repair HTN, hypercholesterolemia, fibromyalgia (diagnosed decades ago), migraines usually on right side 3-4x/week gets an infusion every few months, arthritis, concussion  Left thumb bone spur Bout of vertigo 2024 summer Has 2# weights and red band but doesn't use, has to be careful to avoid flare up  PAIN:   Are you having pain? Yes NPRS scale: 4/10 Pain location: left neck and right neck Pain orientation: Left  PAIN TYPE: aching, burning, and throbbing Pain description: constant  Aggravating factors: stiff with driving, looking up, moving head, raising left arm Relieving factors: minimizing activity level, migraine med   PRECAUTIONS: Other: limit volume of ex secondary to fibromyalgia   WEIGHT BEARING RESTRICTIONS: No  FALLS:  Has patient fallen in last 6 months? No  PLOF: Independent  PATIENT GOALS: stop that nerve irritation; lower the pain  NEXT MD VISIT: after PT  OBJECTIVE:  Note: Objective measures were completed at Evaluation unless otherwise  noted.  DIAGNOSTIC FINDINGS:  Mild; FINDINGS: Alignment: Normal. There is no significant antero or retrolisthesis. There is no jumped or perched facet.   Skull base and vertebrae: Skull base alignment is maintained. Vertebral body heights are preserved. There is no evidence of acute injury. There is no suspicious osseous lesion.   Soft tissues and spinal canal: No prevertebral fluid or swelling. No visible canal hematoma.   Disc levels: There is mild intervertebral disc space narrowing with mild associated uncovertebral arthropathy at C5-C6. There is overall minimal facet arthropathy throughout the cervical spine. There is up to mild left neural foraminal stenosis at C5-C6. There is no other significant spinal canal or neural foraminal stenosis.   Upper chest: The imaged lung apices are clear.   Other: None.   IMPRESSION: Minimal degenerative changes at C5-C6 resulting in mild left neural foraminal stenosis. Otherwise, unremarkable cervical spine CT with no other significant spinal canal or neural foraminal stenosis.    PATIENT SURVEYS:  NDI:  NECK DISABILITY INDEX  Date: 10/15 Score  Pain intensity 3 = The pain  is fairly severe at the moment  2. Personal care (washing, dressing, etc.) 0 = I can look after myself normally without causing extra pain  3. Lifting 3 = Pain prevents me from lifting heavy weights but I can manage light to medium   weights if they are conveniently positioned  4. Reading 2 =  I can read as much as I want with moderate pain in my neck  5. Headaches 3 = I have moderate headaches, which come frequently  6. Concentration 1 =  I can concentrate fully when I want to with slight difficulty   7. Work 3 =  I cannot do my usual work  8. Driving 2 =  I can drive my car as long as I want with moderate pain in my neck  9. Sleeping 1 = My sleep is slightly disturbed (less than 1 hr sleepless)  10. Recreation 1 =  I am able to engage in all my recreation  activities, with some pain in my neck  Total 38%   Minimum Detectable Change (90% confidence): 5 points or 10% points  COGNITION: Overall cognitive status: Within functional limits for tasks assessed  PALPATION: Tender points left upper traps, left paraspinals, bil suboccipitals   CERVICAL ROM:   11/25:  flexion: 55, extension, 40 painful, 45 right, left 40, rotation 30 degrees and painful  Active ROM A/PROM (deg) eval  Flexion 55  Extension 45 pain  Right lateral flexion 42 pain  Left lateral flexion 32  Right rotation 30  Left rotation 30   (Blank rows = not tested)  UPPER EXTREMITY ROM:  Active ROM Right eval Left eval  Shoulder flexion 155 150 pain  Shoulder extension    Shoulder abduction 165 160 pain  Shoulder adduction    Shoulder extension    Shoulder internal rotation T2 T8  Shoulder external rotation C7 C7  Elbow flexion    Elbow extension    Wrist flexion    Wrist extension    Wrist ulnar deviation    Wrist radial deviation    Wrist pronation    Wrist supination     (Blank rows = not tested)  UPPER EXTREMITY MMT:  MMT Right eval Left eval 11/19  Shoulder flexion 5 4- 4  Shoulder extension     Shoulder abduction 5 4- 4  Shoulder adduction     Shoulder extension     Shoulder internal rotation 5 4 4   Shoulder external rotation 5 4 4   Middle trapezius 4 4-   Lower trapezius 4 4-   Elbow flexion     Elbow extension     Wrist flexion     Wrist extension     Wrist ulnar deviation     Wrist radial deviation     Wrist pronation     Wrist supination     Grip strength      (Blank rows = not tested)  CERVICAL SPECIAL TESTS:  No effect with cervical distraction; +left shoulder painful arc; + Vonzell Berber  TREATMENT DATE: 08/05/24 Cervical ROM as above Discussion of McKenzie cervical pillow for sleeping Review of foam roll ex's with recommendation of decreasing reps to 5-10 Supine IFC to bil cervical musculature with heat 6.0 to 6.5 ma 20  min  Pt given home TENS info    TREATMENT DATE: 07/30/24 supine thoracic extension with foam roll 10x MELT method cervical foam roll: rotation, circles, fig 8, nods 5x each 2 rounds with verbal cues for technique Review of  HEP and discussion of volume and areas of focus Trigger Point Dry Needling Subsequent Treatment: Pt instructed on Dry Needling rational, procedures, and possible side effects. Pt instructed to expect mild to moderate muscle soreness later in the day and/or into the next day.  Pt instructed in methods to reduce muscle soreness. Pt instructed to continue prescribed HEP. Patient verbalized understanding of these instructions and education.  Patient Verbal Consent Given: Yes Education Handout Provided: Yes Muscles Treated: bil cervical multifidi, bil suboccipitals;  left upper trap; left levator scap Electrical Stimulation Performed: No Treatment Response/Outcome: improved soft tissue mobility  TREATMENT DATE: 07/17/24 Seated thoracic extension with foam roll 10x Wall foam roll ups with Ues 5x Back to wall with foam vertically with open books, UE elevation, snow angels (cues for thumb up position with abduction due to shoulder discomfort) 5 reps each Karolynn pose with foam roll uncomfortable on shoulder discontinued at 3 reps MELT method cervical foam roll: rotation, circles, fig 8, nods 5x each Trigger Point Dry Needling Subsequent Treatment: Pt instructed on Dry Needling rational, procedures, and possible side effects. Pt instructed to expect mild to moderate muscle soreness later in the day and/or into the next day.  Pt instructed in methods to reduce muscle soreness. Pt instructed to continue prescribed HEP. Patient verbalized understanding of these instructions and education.  Patient Verbal Consent Given: Yes Education Handout Provided: Yes Muscles Treated: bil cervical multifidi, bil suboccipitals;  left upper trap Electrical Stimulation Performed:  No Treatment Response/Outcome: improved soft tissue mobility  TREATMENT DATE: 07/01/24 Discussion of response to treatment and plan for session MELT method cervical foam roll: rotation, circles, fig 8, nods 5x each Seated upper trap and levator scap stretching Seated scap retraction Trigger Point Dry Needling Initial Treatment: Pt instructed on Dry Needling rational, procedures, and possible side effects. Pt instructed to expect mild to moderate muscle soreness later in the day and/or into the next day.  Pt instructed in methods to reduce muscle soreness. Pt instructed to continue prescribed HEP. Patient verbalized understanding of these instructions and education.  Patient Verbal Consent Given: Yes Education Handout Provided: Yes Muscles Treated: left only cervical multifidi, left upper trap Electrical Stimulation Performed: No Treatment Response/Outcome: improved soft tissue mobility     PATIENT EDUCATION:  Education details: Educated patient on anatomy and physiology of current symptoms, prognosis, plan of care as well as initial self care strategies to promote recovery Person educated: Patient Education method: Explanation Education comprehension: verbalized understanding  HOME EXERCISE PROGRAM: MELT method with foam roll ASSESSMENT:  CLINICAL IMPRESSION: Brytnee reports a flare up for several days following the last session.  She continues to have decreased cervical ROM today.  We discussed several strategies in managing pain including reducing number of reps to < 10, use of a cervical pillow and introducing TENS electrical current for pain modulation.  She does report pain reduction at the end of session.    OBJECTIVE IMPAIRMENTS: decreased activity tolerance, decreased ROM, decreased strength, increased fascial restrictions, impaired perceived functional ability, impaired UE functional use, and pain.   ACTIVITY LIMITATIONS: carrying, lifting, sleeping, and reach over  head  PARTICIPATION LIMITATIONS: cleaning, driving, and shopping  PERSONAL FACTORS: Time since onset of injury/illness/exacerbation and 3+ comorbidities: migraines, fibromyalgia, recent surgery are also affecting patient's functional outcome.   REHAB POTENTIAL: Good  CLINICAL DECISION MAKING: Evolving/moderate complexity  EVALUATION COMPLEXITY: Moderate   GOALS: Goals reviewed with patient? Yes  SHORT TERM GOALS: Target date: 07/23/2024   The patient will demonstrate knowledge  of basic self care strategies and exercises to promote healing  Baseline:  Goal status: met 11/19  2.  Improved cervical rotation ROM to 35 degrees and sidebending to 45 degrees needed for driving Baseline:  Goal status: ongoing  3.  The patient will report a 30% improvement in pain levels with functional activities which are currently difficult including carrying items and driving Baseline:  Goal status: ongoing  4.  The patient will have grossly 4/5 strength in UE and periscapular muscles needed to carry items in left arm  Baseline:  Goal status: met 11/19     LONG TERM GOALS: Target date: 08/20/2024    The patient will be independent in a safe self progression of a home exercise program to promote further recovery of function  Baseline:  Goal status: INITIAL  2.  The patient will report a 60% improvement in pain levels with functional activities which are currently difficult including carrying items and driving Baseline:  Goal status: INITIAL  3.  The patient will have grossly 4+/5 strength needed to lift and lower a 3# object from a high shelf  Baseline:  Goal status: INITIAL  4.   Improved cervical rotation ROM to 40 degrees and sidebending to 50 degrees needed for driving Baseline:  Goal status: INITIAL  5.  Neck Disability Index improved to 28% indicating improved function with less pain Baseline:  Goal status: INITIAL    PLAN:  PT FREQUENCY: 1x/week  PT DURATION: 8  weeks  PLANNED INTERVENTIONS: 02835- PT Re-evaluation, 97750- Physical Performance Testing, 97110-Therapeutic exercises, 97530- Therapeutic activity, 97112- Neuromuscular re-education, 97535- Self Care, 02859- Manual therapy, [phone]- Aquatic Therapy, G0283- Electrical stimulation (unattended), [phone]- Electrical stimulation (manual), [id number]- Ultrasound, [id number]- Ionotophoresis 4mg /ml Dexamethasone , 79439 (1-2 muscles), 20561 (3+ muscles)- Dry Needling, Patient/Family education, Taping, Joint mobilization, Spinal mobilization, Cryotherapy, and Moist heat  PLAN FOR NEXT SESSION: see if pt got home TENS unit and/or McKenzie cervical pillow; (hold?) DN in moderation/marination bil cervical multifidi/suboccipitals, upper traps, add levator, rhomboids as tolerated; foam roll MELT and thoracic mobility; low reps and volume of exercise secondary to sensitivity due to fibromyalgia  Glade Pesa, PT 08/05/24 7:28 PM Phone: [phone] Fax: [phone]

## 2024-08-07 ENCOUNTER — Other Ambulatory Visit: Payer: Self-pay | Admitting: Family Medicine

## 2024-08-11 ENCOUNTER — Encounter (INDEPENDENT_AMBULATORY_CARE_PROVIDER_SITE_OTHER): Payer: Self-pay

## 2024-08-11 DIAGNOSIS — Z719 Counseling, unspecified: Secondary | ICD-10-CM

## 2024-08-12 ENCOUNTER — Ambulatory Visit: Attending: Orthopedic Surgery | Admitting: Physical Therapy

## 2024-08-12 ENCOUNTER — Encounter: Payer: Self-pay | Admitting: Physical Therapy

## 2024-08-12 DIAGNOSIS — M25512 Pain in left shoulder: Secondary | ICD-10-CM | POA: Diagnosis present

## 2024-08-12 DIAGNOSIS — G8929 Other chronic pain: Secondary | ICD-10-CM | POA: Insufficient documentation

## 2024-08-12 DIAGNOSIS — M542 Cervicalgia: Secondary | ICD-10-CM | POA: Diagnosis present

## 2024-08-12 DIAGNOSIS — M6281 Muscle weakness (generalized): Secondary | ICD-10-CM | POA: Insufficient documentation

## 2024-08-12 NOTE — Therapy (Signed)
 OUTPATIENT PHYSICAL THERAPY CERVICAL/SHOULDER PROGRESS NOTE   Patient Name: Molly Giles MRN: [id number] DOB:07-06-1956, 68 y.o., female Today's Date: 08/12/2024  END OF SESSION:  PT End of Session - 08/12/24 0852     Visit Number 6    Date for Recertification  08/20/24    Authorization Type medicare and BCBS supplemental ABN signed for December for DN    Progress Note Due on Visit 10    PT Start Time 0850    PT Stop Time 0930    PT Time Calculation (min) 40 min    Activity Tolerance Patient tolerated treatment well          Past Medical History:  Diagnosis Date   Abdominal bloating 05/10/2021   Abdominal discomfort, generalized 05/07/2016   Acquired hallux rigidus of right foot 09/15/2022   Acute gastritis 11/19/2020   Allergic rhinoconjunctivitis 07/02/2015   Anal fissure 11/19/2020   Anterior to posterior tear of superior glenoid labrum of left shoulder 02/22/2021   Arthritis    Arthritis of carpometacarpal [facility]) joint of left thumb 09/07/2023   Aseptic loosening of prosthetic hip, initial encounter 11/29/2017   Benign essential hypertension 10/28/2012   Benign positional vertigo 04/2015   Responded well to [facility]   Bruxism (teeth grinding)    Cervical spondylosis 11/18/2018   Chronic contact dermatitis 08/06/2018   Per allergist Dr Almarie Scala.    Chronic interstitial cystitis 11/09/2011   Managed by [facility] urology branch in [place] hydrodistention 5 years ago with Dr Claudene in [place], has relief of symptoms until now     Chronic low back pain 05/14/2019   Chronic migraine 02/25/2016   Per Dr Ines review in notes from [facility] from September 2015. Showed total headache days last month 18. Severe headache days 7 days. Moderate headache days 5 days. Mild headache days last month sick days. Days without headache last month 10 days. Symptoms associated with photophobia, phonophobia, osmophobia, neck pain, dizziness, jaw pain, nasal  congestion, vision disturbances, tingling and numbness, weakness and worsening with activity. Each headache attack last 3 hours depending on treatment in severity. Left side, the right side, easier side, the frontal area in the back of the head. Characterized as throbbing, pressure, tightness, squeezing, stabbing and burning    Chronic migraine without aura 05/15/2013    Dr Ines [facility]   Chronic tonsillitis 01/27/2019   DDD (degenerative disc disease), cervical 11/18/2018   DDD (degenerative disc disease), lumbar    Depression    Disc displacement, lumbar    Dysphagia 10/11/2021   Dysrhythmia    seen by dr Blanca- not a problem since she has been on Bystolic    Epiretinal membrane (ERM) of left eye 10/17/2021   Periodic ophthalmological monitoring     Episodic cluster headache, not intractable 03/06/2017   Essential hypertension 05/15/2013   Family history of adverse reaction to anesthesia    Brother- N/V   Family history of premature CAD 05/15/2013   Fibromyalgia 10/01/2013   Management by Dr GORMAN. Devashwar (Rhe[facility])     Fibromyalgia syndrome 07/01/2015   Management by Dr GORMAN. Devashwar ([facility]he[facility])    GERD (gastroesophageal reflux disease) 12/16/2014   H/O seasonal allergies    Hallux rigidus of right foot 09/25/2022   Hammer toe    Left great toe   Hashimoto's thyroiditis    Per patient, diagnosed by Dr. Sharyne Pacini   Hearing loss of both ears 07/27/2015   mild to borderline moderate low frequency hearing loss  improving to within normal limits bilaterally on audiology testing at [facility] in November 2016.     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   Hyperlipidemia 1998   Hypokalemia due to excessive gastrointestinal loss of potassium 07/28/2019   Hypothyroidism    Iliopsoas bursitis of left hip 05/08/2022   Impairment of balance 02/2015   Consequent of postconcussive syndrome   Injury of triangular fibrocartilage complex (TFCC) 02/25/2019   Injury of triangular fibrocartilage complex of left wrist 02/25/2019   Dx 02/25/19 Prentice Pagan IV MD ([facility])   Insulin  resistance 07/04/2017   Internal hemorrhoid 01/28/2021   Internal hemorrhoid seen on colonoscopy 10/2020 REGINOLD Sol MD [facility])   Interstitial cystitis    Irritable bowel syndrome with diarrhea 04/03/2016   Keratoconjunctivitis sicca 11/18/2018   (+) ANA     Left ventricular hypertrophy, mild 02/25/2016   ECHOcardiogram report 06/17/15 showing EF55-60%, mild LVH and G1DD    Loose total hip arthroplasty 03/10/2018   [facility]   Low serum vitamin B12 01/10/2023   Lumbar facet joint pain    Meibomian gland dysfunction (MGD) of both eyes 10/17/2021   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   Metatarsalgia of both feet 07/31/2022   Mood disorder    Morbid obesity (HCC) 03/06/2017   Morton's neuroma of right foot 09/15/2022   Musculoskeletal neck pain 07/14/2015   Nocturnal hypoxemia 03/06/2017   Normal coronary arteries 05/14/2014   Nuclear sclerosis of both eyes 10/17/2021   OAB (overactive bladder) 03/02/2021   Formatting of this note might be different from the original.  Added automatically from request for surgery [id number]     Formatting of this note might be different from the original.  Added automatically from request for surgery [id number]     Obesity (BMI 30.0-34.9) 01/29/2017   Odynophagia 12/20/2021   OSA (obstructive sleep apnea) 12/27/2022   Dr Dohmeier polysomnography titration study 04/27/24  There was no clinically significant apnea noted under 5 cm water  CPAP pressure  with an AHI of 0.7/h.  What was present was hypoxia- not severe, and loud snoring.   CMS AHI was 0/h under 6 cm water  CPAP which reduced the snoring somewhat and reduced hypoxia. Higher CPAP pressures induced more apneas and hypopneas.  The weight loss has reduced the   Osteoarthritis of acromioclavicular joint 02/22/2021   Osteoarthritis of left hip 11/01/2015   MRI order by Dr Ernie (ortho) 10/2015 showed significant arthritis of left hip joint with cystic changes in femoral head c/w osteoarthritis   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, lumbar region 10/30/2011   Other insomnia 11/09/2016   Overweight    Pain in joint of left shoulder 11/09/2016   Pain in joint, multiple sites 11/18/2018   Palpitations 05/15/2013   [facility] manages   Perianal candidiasis 12/20/2021   Periodic limb movement sleep disorder 03/28/2017   Perirectal cyst 05/07/2016   Plantar fasciitis of right foot 11/10/2022   Poison ivy dermatitis 03/24/2021   PONV (postoperative nausea and vomiting)    Positive ANA (antinuclear antibody) 11/18/2018   Post concussion syndrome 06/06/2015   Post concussive syndrome 07/14/2015  Ms Eaddy's post-concussive syndrome manifesting in vertigo and headache, mood changes, poor balance, dizziness, and decreased concentration per Dr Ines at [facility].    Posterior vitreous detachment of right eye 2014   Premature menopause 12/20/2021   Primary osteoarthritis of left knee 06/20/2018   Psychophysiological insomnia 11/07/2022   Pure hypercholesterolemia 05/15/2013   Rectal abscess 05/10/2021   S/P left THA, AA 07/25/2016   S/P revision of total hip 04/10/2018   S/P tendon repair 01/23/2024   Sacroiliac inflammation 08/18/2021   Severe obstructive sleep apnea-hypopnea syndrome 12/22/2022   Shingles    Shortness of breath dyspnea    with exertion   Sicca syndrome 11/18/2018   (+) ANA   Sjogren syndrome 01/27/2019   Sjogren's syndrome  01/27/2019   Sleep walking and eating 03/06/2017   Snoring 03/06/2017   Spondylosis of lumbar region without myelopathy or radiculopathy 10/30/2011   Status post total hip replacement, left 09/20/2017   TFC (triangular fibrocartilage complex) injury 02/25/2019   Thyroid  nodule 08/11/2009   Findings: The thyroid  gland is within normal limits in size.  The gland is diffusely inhomogeneous. A small solid nodule is noted in the lower pole  medially on the right of 7 x 6 x 8 mm. A small solid nodule is noted inferiorly on the left of 3 x 3 x 4 mm.  IMPRESSION:  The thyroid  gland is within normal limits in size with only small solid nodules present, the largest of only 8 mm in diameter on the right.     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    Trochanteric bursitis, right hip 04/26/2020   Tubular adenoma of colon 01/28/2021   Colonoscopy screening 4 mm tubular Adenoma polyp Marilu Sol, MD [facility])   Tubular adenoma of colon 01/28/2021   Colonoscopy screening 4 mm tubular Adenoma polyp Marilu Sol, MD [facility])   Vasomotor symptoms due to menopause 04/19/2017   Vertigo 03/19/2023   Vitamin D  deficiency 05/08/2017   Vulvitis 07/22/2020   Yeast vaginitis 07/11/2019   Past Surgical History:  Procedure Laterality Date   arthroscopy Left 01/2022   with SAD and DCR, K. Supple MD   Bladder dilitation     x 3   BREAST BIOPSY Right 2011   Benign histology   CARDIOVASCULAR STRESS TEST  2000   Unremarkable per pt report   CARPOMETACARPAL JOINT ARTHROTOMY Right 2011   COLONOSCOPY     COLONOSCOPY WITH PROPOFOL  N/A 04/21/2015   Procedure: COLONOSCOPY WITH PROPOFOL ;  Surgeon: Belvie Just, MD;  Location: [facility] ENDOSCOPY;  Service: Endoscopy;  Laterality: N/A;   CYSTOSCOPY W/ DILATION OF BLADDER N/A    EPIDURAL BLOCK INJECTION Left 04/12/2016   Left Medial Nerve Block and Left L5 ramus block, Dr Charlie Dolores    EPIDURAL BLOCK  INJECTION  03/21/2016   Left L3-4 medial branch block and Left L5 & dorsal ramus block    EPIDURAL BLOCK INJECTION N/A 10/25/2016   Charlie Dolores, MD. Lumbar medial branch block   EPIDURAL BLOCK INJECTION N/A 02/09/2017   Charlie Dolores, MD.  Bilateral L3/4 medial branch block, bilateral L5 dorsal ramus block   EPIDURAL BLOCK INJECTION N/A 07/04/2017   Charlie Dolores, MD   EXCISION MORTON'S NEUROMA Right 05/03/2023   Procedure: EXCISION MORTON'S NEUROMA;  Surgeon: Kit Rush, MD;  Location: [facility];  Service: Orthopedics;  Laterality: Right;  general, local by surgeon 60 MIN   FECAL TRANSPLANT  04/21/2015   Procedure: FECAL TRANSPLANT;  Surgeon: Belvie Just, MD;  Location: [facility] ENDOSCOPY;  Service: Endoscopy;;   HIP ADDUCTOR TENOTOMY  01/2024   HIP ARTHROPLASTY Left    HIP ARTHROSCOPY Left 03/06/2018   Left hip arthroplasty, redo for loose hip arthroplasty. Procedure at [facility] hospital   INJECTION HIP INTRA ARTICULAR Left 11/2015   for OA by Dr Charlie Bonner REDBIRD IMPLANT PLACEMENT  04/2021   Norleen Sharps MD ([facility])   INTERSTIM IMPLANT REVISION N/A 09/2021   Norleen Sharps MD ([facility])   LIGAMENT REPAIR Right 05/03/2023   Procedure: COLLATERAL LIGAMENT REPAIR;  Surgeon: Kit Norleen, MD;  Location: [facility];  Service: Orthopedics;  Laterality: Right;  general, local by surgeon 60 MIN   OTHER SURGICAL HISTORY Left 2016   Left L3/L4 medial nerve block and Left L5 Dorsal Ramus block Dr FABIENE Bonner   TOTAL HIP ARTHROPLASTY Left 07/25/2016   Procedure: LEFT TOTAL HIP ARTHROPLASTY ANTERIOR APPROACH;  Surgeon: Donnice Car, MD;  Location: [facility];  Service: Orthopedics;  Laterality: Left;   WEIL OSTEOTOMY Right 05/03/2023   Procedure: RIGHT 2ND WEIL OSTEOTOMY;  Surgeon: Kit Norleen, MD;  Location: [facility];  Service: Orthopedics;  Laterality: Right;  general, local by surgeon 60 MIN   Patient Active Problem  List   Diagnosis Date Noted   Chronic pain of left upper extremity 02/19/2024   Arthritis of carpometacarpal [facility]) joint of left thumb 09/07/2023   BMI 26.0-26.9,adult 02/07/2023   B12 deficiency 01/10/2023   OSA (obstructive sleep apnea) 12/27/2022   Severe obstructive sleep apnea-hypopnea syndrome 12/22/2022   Hyperglycemia 12/11/2022   Generalized obesity 12/11/2022   SOBOE (shortness of breath on exertion) 12/11/2022   Other fatigue 12/11/2022   Sleep related headaches 11/07/2022   Intractable episodic cluster headache 11/07/2022   Psychophysiological insomnia 11/07/2022   Hallux rigidus of right foot 09/25/2022   Chronic pain of right knee 05/08/2022   Arthrosis of hand 12/20/2021   History of Clostridioides difficile colitis, required fecal transplantation 12/20/2021   Meibomian gland dysfunction (MGD) of both eyes 10/17/2021   Nuclear sclerosis of both eyes 10/17/2021   Epiretinal membrane (ERM) of left eye 10/17/2021   OAB (overactive bladder) 03/02/2021   Osteoarthritis of acromioclavicular joint 02/22/2021   Hyperhidrosis, scalp, 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   Chronic low back pain 05/14/2019   Sjogren syndrome 01/27/2019   DDD (degenerative disc disease), cervical 12/27/2018   Cervical spondylosis 11/18/2018   DDD (degenerative disc disease), lumbar 11/18/2018   Keratoconjunctivitis sicca 11/18/2018   Chronic contact dermatitis 08/06/2018   Primary osteoarthritis of left knee 06/20/2018   Insulin  resistance 07/04/2017   Vitamin D  deficiency 05/08/2017   Irritable bowel syndrome with diarrhea 04/03/2016   Left ventricular hypertrophy, mild 02/25/2016   Allergic rhinoconjunctivitis 07/02/2015   History of colonic polyps 07/01/2015   GERD (gastroesophageal reflux disease) 12/16/2014   Fibromyalgia 10/01/2013   Mood disorder 10/01/2013   Hyperlipidemia 10/01/2013   Pure hypercholesterolemia 05/15/2013   Chronic migraine without aura 05/15/2013   Benign essential hypertension  10/28/2012   Hypothyroidism 11/09/2011   Chronic interstitial cystitis 11/09/2011    PCP: McDiarmid, Krystal MD  REFERRING PROVIDER:Supple, Franky MD  REFERRING DIAG: M25  THERAPY DIAG:  Cervicalgia  Chronic left shoulder pain  Muscle weakness (generalized)  Rationale for Evaluation and Treatment: Rehabilitation  ONSET DATE: chronic issue, worsened recently during and following surgery for hip  SUBJECTIVE:  SUBJECTIVE STATEMENT: The pain I was in, is better.  I'm getting into the MELT method.  This left side is not loose yet.  I think I'll order the TENS unit but there's been a lot going on with my dog (who is sick)   Hand dominance: Right  PERTINENT HISTORY:  History of PT for right hip pain and glute med tendon repair HTN, hypercholesterolemia, fibromyalgia (diagnosed decades ago), migraines usually on right side 3-4x/week gets an infusion every few months, arthritis, concussion  Left thumb bone spur Bout of vertigo 2024 summer Has 2# weights and red band but doesn't use, has to be careful to avoid flare up  PAIN:   Are you having pain? Yes NPRS scale: 5/10 Pain location: left neck especially upper trap Pain orientation: Left  PAIN TYPE: aching, burning, and throbbing Pain description: constant  Aggravating factors: stiff with driving, looking up, moving head, raising left arm Relieving factors: minimizing activity level, migraine med   PRECAUTIONS: Other: limit volume of ex secondary to fibromyalgia   WEIGHT BEARING RESTRICTIONS: No  FALLS:  Has patient fallen in last 6 months? No  PLOF: Independent  PATIENT GOALS: stop that nerve irritation; lower the pain  NEXT MD VISIT: after PT  OBJECTIVE:  Note: Objective measures were completed at Evaluation unless  otherwise noted.  DIAGNOSTIC FINDINGS:  Mild; FINDINGS: Alignment: Normal. There is no significant antero or retrolisthesis. There is no jumped or perched facet.   Skull base and vertebrae: Skull base alignment is maintained. Vertebral body heights are preserved. There is no evidence of acute injury. There is no suspicious osseous lesion.   Soft tissues and spinal canal: No prevertebral fluid or swelling. No visible canal hematoma.   Disc levels: There is mild intervertebral disc space narrowing with mild associated uncovertebral arthropathy at C5-C6. There is overall minimal facet arthropathy throughout the cervical spine. There is up to mild left neural foraminal stenosis at C5-C6. There is no other significant spinal canal or neural foraminal stenosis.   Upper chest: The imaged lung apices are clear.   Other: None.   IMPRESSION: Minimal degenerative changes at C5-C6 resulting in mild left neural foraminal stenosis. Otherwise, unremarkable cervical spine CT with no other significant spinal canal or neural foraminal stenosis.    PATIENT SURVEYS:  NDI:  NECK DISABILITY INDEX  Date: 10/15 Score  Pain intensity 3 = The pain is fairly severe at the moment  2. Personal care (washing, dressing, etc.) 0 = I can look after myself normally without causing extra pain  3. Lifting 3 = Pain prevents me from lifting heavy weights but I can manage light to medium   weights if they are conveniently positioned  4. Reading 2 =  I can read as much as I want with moderate pain in my neck  5. Headaches 3 = I have moderate headaches, which come frequently  6. Concentration 1 =  I can concentrate fully when I want to with slight difficulty   7. Work 3 =  I cannot do my usual work  8. Driving 2 =  I can drive my car as long as I want with moderate pain in my neck  9. Sleeping 1 = My sleep is slightly disturbed (less than 1 hr sleepless)  10. Recreation 1 =  I am able to engage in all my  recreation activities, with some pain in my neck  Total 38%   Minimum Detectable Change (90% confidence): 5 points or 10% points  COGNITION:  Overall cognitive status: Within functional limits for tasks assessed  PALPATION: Tender points left upper traps, left paraspinals, bil suboccipitals   CERVICAL ROM:   11/25:  flexion: 55, extension, 40 painful, 45 right, left 40, rotation 30 degrees and painful  Active ROM A/PROM (deg) eval  Flexion 55  Extension 45 pain  Right lateral flexion 42 pain  Left lateral flexion 32  Right rotation 30  Left rotation 30   (Blank rows = not tested)  UPPER EXTREMITY ROM:  Active ROM Right eval Left eval  Shoulder flexion 155 150 pain  Shoulder extension    Shoulder abduction 165 160 pain  Shoulder adduction    Shoulder extension    Shoulder internal rotation T2 T8  Shoulder external rotation C7 C7  Elbow flexion    Elbow extension    Wrist flexion    Wrist extension    Wrist ulnar deviation    Wrist radial deviation    Wrist pronation    Wrist supination     (Blank rows = not tested)  UPPER EXTREMITY MMT:  MMT Right eval Left eval 11/19  Shoulder flexion 5 4- 4  Shoulder extension     Shoulder abduction 5 4- 4  Shoulder adduction     Shoulder extension     Shoulder internal rotation 5 4 4   Shoulder external rotation 5 4 4   Middle trapezius 4 4-   Lower trapezius 4 4-   Elbow flexion     Elbow extension     Wrist flexion     Wrist extension     Wrist ulnar deviation     Wrist radial deviation     Wrist pronation     Wrist supination     Grip strength      (Blank rows = not tested)  CERVICAL SPECIAL TESTS:  No effect with cervical distraction; +left shoulder painful arc; + Vonzell Berber  TREATMENT DATE 08/12/24: Review of status including response to MELT method Trigger Point Dry Needling Subsequent Treatment: Pt instructed on Dry Needling rational, procedures, and possible side effects. Pt instructed to  expect mild to moderate muscle soreness later in the day and/or into the next day.  Pt instructed in methods to reduce muscle soreness. Pt instructed to continue prescribed HEP. Patient verbalized understanding of these instructions and education.  Patient Verbal Consent Given: Yes Education Handout Provided: Yes Muscles Treated: left upper trap; left levator scap Electrical Stimulation Performed: No Treatment Response/Outcome: improved soft tissue mobility Moist heat with ES to left cervical paraspinals and left upper traps to decrease post DN soreness 10 min  TREATMENT DATE: 08/05/24 Cervical ROM as above Discussion of McKenzie cervical pillow for sleeping Review of foam roll ex's with recommendation of decreasing reps to 5-10 Supine IFC to bil cervical musculature with heat 6.0 to 6.5 ma 20 min  Pt given home TENS info    TREATMENT DATE: 07/30/24 supine thoracic extension with foam roll 10x MELT method cervical foam roll: rotation, circles, fig 8, nods 5x each 2 rounds with verbal cues for technique Review of HEP and discussion of volume and areas of focus Trigger Point Dry Needling Subsequent Treatment: Pt instructed on Dry Needling rational, procedures, and possible side effects. Pt instructed to expect mild to moderate muscle soreness later in the day and/or into the next day.  Pt instructed in methods to reduce muscle soreness. Pt instructed to continue prescribed HEP. Patient verbalized understanding of these instructions and education.  Patient Verbal Consent Given: Yes Education  Handout Provided: Yes Muscles Treated: bil cervical multifidi, bil suboccipitals;  left upper trap; left levator scap Electrical Stimulation Performed: No Treatment Response/Outcome: improved soft tissue mobility  TREATMENT DATE: 07/17/24 Seated thoracic extension with foam roll 10x Wall foam roll ups with Ues 5x Back to wall with foam vertically with open books, UE elevation, snow angels (cues  for thumb up position with abduction due to shoulder discomfort) 5 reps each Karolynn pose with foam roll uncomfortable on shoulder discontinued at 3 reps MELT method cervical foam roll: rotation, circles, fig 8, nods 5x each Trigger Point Dry Needling Subsequent Treatment: Pt instructed on Dry Needling rational, procedures, and possible side effects. Pt instructed to expect mild to moderate muscle soreness later in the day and/or into the next day.  Pt instructed in methods to reduce muscle soreness. Pt instructed to continue prescribed HEP. Patient verbalized understanding of these instructions and education.  Patient Verbal Consent Given: Yes Education Handout Provided: Yes Muscles Treated: bil cervical multifidi, bil suboccipitals;  left upper trap Electrical Stimulation Performed: No Treatment Response/Outcome: improved soft tissue mobility  TREATMENT DATE: 07/01/24 Discussion of response to treatment and plan for session MELT method cervical foam roll: rotation, circles, fig 8, nods 5x each Seated upper trap and levator scap stretching Seated scap retraction Trigger Point Dry Needling Initial Treatment: Pt instructed on Dry Needling rational, procedures, and possible side effects. Pt instructed to expect mild to moderate muscle soreness later in the day and/or into the next day.  Pt instructed in methods to reduce muscle soreness. Pt instructed to continue prescribed HEP. Patient verbalized understanding of these instructions and education.  Patient Verbal Consent Given: Yes Education Handout Provided: Yes Muscles Treated: left only cervical multifidi, left upper trap Electrical Stimulation Performed: No Treatment Response/Outcome: improved soft tissue mobility     PATIENT EDUCATION:  Education details: Educated patient on anatomy and physiology of current symptoms, prognosis, plan of care as well as initial self care strategies to promote recovery Person educated:  Patient Education method: Explanation Education comprehension: verbalized understanding  HOME EXERCISE PROGRAM: MELT method with foam roll ASSESSMENT:  CLINICAL IMPRESSION: Performed limited volume of DN secondary to high sensitivity.  She tolerates this fairly well but followed with ES and heat to further modulate post DN soreness.  Continued to discuss other self care/home strategies for pain management.  Therapist monitoring response to all interventions and modifying treatment accordingly.     OBJECTIVE IMPAIRMENTS: decreased activity tolerance, decreased ROM, decreased strength, increased fascial restrictions, impaired perceived functional ability, impaired UE functional use, and pain.   ACTIVITY LIMITATIONS: carrying, lifting, sleeping, and reach over head  PARTICIPATION LIMITATIONS: cleaning, driving, and shopping  PERSONAL FACTORS: Time since onset of injury/illness/exacerbation and 3+ comorbidities: migraines, fibromyalgia, recent surgery are also affecting patient's functional outcome.   REHAB POTENTIAL: Good  CLINICAL DECISION MAKING: Evolving/moderate complexity  EVALUATION COMPLEXITY: Moderate   GOALS: Goals reviewed with patient? Yes  SHORT TERM GOALS: Target date: 07/23/2024   The patient will demonstrate knowledge of basic self care strategies and exercises to promote healing  Baseline:  Goal status: met 11/19  2.  Improved cervical rotation ROM to 35 degrees and sidebending to 45 degrees needed for driving Baseline:  Goal status: ongoing  3.  The patient will report a 30% improvement in pain levels with functional activities which are currently difficult including carrying items and driving Baseline:  Goal status: ongoing  4.  The patient will have grossly 4/5 strength in UE and periscapular muscles  needed to carry items in left arm  Baseline:  Goal status: met 11/19     LONG TERM GOALS: Target date: 08/20/2024    The patient will be  independent in a safe self progression of a home exercise program to promote further recovery of function  Baseline:  Goal status: INITIAL  2.  The patient will report a 60% improvement in pain levels with functional activities which are currently difficult including carrying items and driving Baseline:  Goal status: INITIAL  3.  The patient will have grossly 4+/5 strength needed to lift and lower a 3# object from a high shelf  Baseline:  Goal status: INITIAL  4.   Improved cervical rotation ROM to 40 degrees and sidebending to 50 degrees needed for driving Baseline:  Goal status: INITIAL  5.  Neck Disability Index improved to 28% indicating improved function with less pain Baseline:  Goal status: INITIAL    PLAN:  PT FREQUENCY: 1x/week  PT DURATION: 8 weeks  PLANNED INTERVENTIONS: 02835- PT Re-evaluation, 97750- Physical Performance Testing, 97110-Therapeutic exercises, 97530- Therapeutic activity, 97112- Neuromuscular re-education, 97535- Self Care, 02859- Manual therapy, [phone]- Aquatic Therapy, G0283- Electrical stimulation (unattended), [phone]- Electrical stimulation (manual), [id number]- Ultrasound, [id number]- Ionotophoresis 4mg /ml Dexamethasone , 79439 (1-2 muscles), 20561 (3+ muscles)- Dry Needling, Patient/Family education, Taping, Joint mobilization, Spinal mobilization, Cryotherapy, and Moist heat  PLAN FOR NEXT SESSION: see if pt got home TENS unit and/or McKenzie cervical pillow;  DN in moderation/marination bil cervical multifidi/suboccipitals, upper traps, add levator, rhomboids as tolerated; foam roll MELT and thoracic mobility; low reps and volume of exercise secondary to sensitivity due to fibromyalgia  Glade Pesa, PT 08/12/24 8:11 PM Phone: [phone] Fax: [phone]

## 2024-08-13 ENCOUNTER — Ambulatory Visit: Admitting: Physical Therapy

## 2024-08-18 ENCOUNTER — Telehealth: Payer: Self-pay

## 2024-08-18 ENCOUNTER — Telehealth (INDEPENDENT_AMBULATORY_CARE_PROVIDER_SITE_OTHER): Payer: Self-pay

## 2024-08-18 ENCOUNTER — Telehealth: Admitting: Family Medicine

## 2024-08-18 ENCOUNTER — Encounter (INDEPENDENT_AMBULATORY_CARE_PROVIDER_SITE_OTHER): Payer: Self-pay | Admitting: Family Medicine

## 2024-08-18 ENCOUNTER — Telehealth: Payer: Self-pay | Admitting: Family Medicine

## 2024-08-18 DIAGNOSIS — R197 Diarrhea, unspecified: Secondary | ICD-10-CM | POA: Diagnosis not present

## 2024-08-18 NOTE — Telephone Encounter (Signed)
 Patient called wanting to ask Dr Verdon a question but Dr Verdon is out of the office today. She said she started on Zepbound  7.5 10 days ago  (she thinks) and she is having severe intestinal issues. She also said she has broke out in hives. I asked if she had any other symptoms, and she does not. She said she started on prednisone  for the hives, but nothing else new. I asked if she wanted to come in and see another provider here today, but she refused. I then told her my recommendation would be to call her PCP or go to urgent care. She then stated she did not want to go to urgent care and she did not want to see anyone but doctor Verdon. So she is planning to send Dr Verdon a message in [place] instead. I explained that she is out of the office today and may not read her messages. She then said she would call her PCP too.

## 2024-08-18 NOTE — Telephone Encounter (Signed)
 Left VM that clinic is closing at 3 pm in case her husband coming to pick up stool vial today. Also that delayed start at 10 am tomorrow. Left clinic number to call back if questions.  Rollene Keeling MD

## 2024-08-18 NOTE — Telephone Encounter (Signed)
 Patient calls nurse line reporting diarrhea for 1 week.   She reports she made an apt for this morning at 11:30 with Dr. Donzetta for symptoms.   She reports she just showered in preparation to leave, however she is feeling weak and dizzy. She reports diarrhea has progressed to ~every 15 minutes. She reports she is fearful she has Cdiff. She reports she has had Cdiff in the past.  She denies any fevers, however she reports chills. She denies any blood. She reports nausea, however no vomiting. No syncope episodes.   She reports she has been staying well hydrated throughout.   She is requesting this apt to be switched to a virtual apt due to symptoms.   Will forward to provider.

## 2024-08-18 NOTE — Progress Notes (Signed)
 [facility] Telemedicine Visit  Patient consented to have virtual visit and was identified by name and date of birth. Method of visit: Video  Encounter participants: Patient: Molly Giles - located at home Provider: Rollene FORBES Keeling - located at [facility]  Chief Complaint: diarrhea  HPI:  Diarrhea- x1 week, foul smelling and smells like when she had C diff in the past. No recent antibiotics, no PPI, did start prednisone  last week for hives after being seen by her dermatologist. No blood in stool, still drinking and urinating normally. Eating yesterday, not as much yet today. Some cramping but no severe abdominal pain, no fevers. Having a BM every 15-30 minutes.   ROS: per HPI  Pertinent PMHx:  h/o C diff x2 with fecal transplant, IBS  Exam:  There were no vitals taken for this visit.  HEENT: NAD, MMM Respiratory: soeaking in complete sentences Abd: pushes on abdomen without jumping or peritoneal signs  Assessment/Plan:  Assessment & Plan Diarrhea, unspecified type Concern for Cdiff with her history and reported smell Discussed holding zepbound  until this resolves GI PCR panel and C diff toxin ordered, her husband will pick up today  Plan for vancomycin  if positive for C diff Discsussed ED precautions including fevers, worsening severe abd pain, unable to tolerate PO, reduced urination, severe weakness   Time spent during visit with patient: 8 minutes

## 2024-08-19 ENCOUNTER — Encounter: Payer: Self-pay | Admitting: Family Medicine

## 2024-08-20 ENCOUNTER — Ambulatory Visit: Admitting: Physical Therapy

## 2024-08-20 NOTE — Telephone Encounter (Signed)
 Patient returns call to nurse line. Advised that results are not yet available.   She denies any worsening in lightheadedness/dizziness.   Only new symptom is sweating throughout the night.   Supportive measures discussed. She is asking if there is anything she could be taking to help with her current symptoms.   Will forward to Dr. Donzetta and PCP.   Advised patient that we would also reach out as soon as results are available.   Chiquita JAYSON English, RN

## 2024-08-21 ENCOUNTER — Telehealth: Payer: Self-pay | Admitting: Family Medicine

## 2024-08-21 DIAGNOSIS — A498 Other bacterial infections of unspecified site: Secondary | ICD-10-CM

## 2024-08-21 LAB — GI PROFILE, STOOL, PCR

## 2024-08-21 LAB — C DIFFICILE TOXINS A+B W/RFLX: C difficile Toxins A+B, EIA: POSITIVE — AB

## 2024-08-21 MED ORDER — VANCOMYCIN HCL 125 MG PO CAPS: 125.0000 mg | ORAL_CAPSULE | Freq: Four times a day (QID) | ORAL | 0 refills | Status: DC

## 2024-08-21 NOTE — Telephone Encounter (Signed)
 Called pt to discuss C diff Antigen positive, with her symptoms meaning this is likely C diff infection. D/w her PCP as well. Vancomycin  125mg  QID x 10 days sent to pharmacy. She notes no night sweats last night, still drinking and urinating, no fevers, no blood in stool, no severe abdominal pain but movement does make her have to go. Discussed if any of these things occurred to call clinic to schedule appt to be seen. Recommend f/u with PCP and GI doctor as this is her third episode of C dfif and has had a fecal transplant, plus no recent antibiotics or triggers.  Discussed ED/return precautions including fevers, blood in stool, severe abd pain, inability to tolerate PO.   Rollene Keeling MD

## 2024-08-27 ENCOUNTER — Ambulatory Visit: Admitting: Physical Therapy

## 2024-08-27 ENCOUNTER — Encounter: Payer: Self-pay | Admitting: Physical Therapy

## 2024-08-27 DIAGNOSIS — M542 Cervicalgia: Secondary | ICD-10-CM

## 2024-08-27 DIAGNOSIS — G8929 Other chronic pain: Secondary | ICD-10-CM

## 2024-08-27 DIAGNOSIS — M6281 Muscle weakness (generalized): Secondary | ICD-10-CM

## 2024-08-27 NOTE — Therapy (Signed)
 OUTPATIENT PHYSICAL THERAPY CERVICAL/SHOULDER PROGRESS NOTE/RECERTIFICATION   Patient Name: Molly Giles MRN: [id number] DOB:23-Jul-1956, 68 y.o., female Today's Date: 08/27/2024  END OF SESSION:  PT End of Session - 08/27/24 1107     Visit Number 7    Date for Recertification  10/22/24    Authorization Type medicare and BCBS supplemental ABN signed for December for DN    Progress Note Due on Visit 10    PT Start Time 1103    PT Stop Time 1140    PT Time Calculation (min) 37 min    Activity Tolerance Patient tolerated treatment well          Past Medical History:  Diagnosis Date   Abdominal bloating 05/10/2021   Abdominal discomfort, generalized 05/07/2016   Acquired hallux rigidus of right foot 09/15/2022   Acute gastritis 11/19/2020   Allergic rhinoconjunctivitis 07/02/2015   Anal fissure 11/19/2020   Anterior to posterior tear of superior glenoid labrum of left shoulder 02/22/2021   Arthritis    Arthritis of carpometacarpal (CMC) joint of left thumb 09/07/2023   Aseptic loosening of prosthetic hip, initial encounter 11/29/2017   Benign essential hypertension 10/28/2012   Benign positional vertigo 04/2015   Responded well to [facility]   Bruxism (teeth grinding)    Cervical spondylosis 11/18/2018   Chronic contact dermatitis 08/06/2018   Per allergist Dr Almarie Scala.    Chronic interstitial cystitis 11/09/2011   Managed by [facility] urology branch in [place] hydrodistention 5 years ago with Dr Claudene in [place], has relief of symptoms until now     Chronic low back pain 05/14/2019   Chronic migraine 02/25/2016   Per Dr Ines review in notes from [facility] from September 2015. Showed total headache days last month 18. Severe headache days 7 days. Moderate headache days 5 days. Mild headache days last month sick days. Days without headache last month 10 days. Symptoms associated with photophobia, phonophobia, osmophobia, neck pain, dizziness, jaw  pain, nasal congestion, vision disturbances, tingling and numbness, weakness and worsening with activity. Each headache attack last 3 hours depending on treatment in severity. Left side, the right side, easier side, the frontal area in the back of the head. Characterized as throbbing, pressure, tightness, squeezing, stabbing and burning    Chronic migraine without aura 05/15/2013    Dr Ines [facility]   Chronic tonsillitis 01/27/2019   DDD (degenerative disc disease), cervical 11/18/2018   DDD (degenerative disc disease), lumbar    Depression    Disc displacement, lumbar    Dysphagia 10/11/2021   Dysrhythmia    seen by dr Blanca- not a problem since she has been on Bystolic    Epiretinal membrane (ERM) of left eye 10/17/2021   Periodic ophthalmological monitoring     Episodic cluster headache, not intractable 03/06/2017   Essential hypertension 05/15/2013   Family history of adverse reaction to anesthesia    Brother- N/V   Family history of premature CAD 05/15/2013   Fibromyalgia 10/01/2013   Management by Dr GORMAN. Devashwar (Rheum)     Fibromyalgia syndrome 07/01/2015   Management by Dr GORMAN. Devashwar (Rheum)    GERD (gastroesophageal reflux disease) 12/16/2014   H/O seasonal allergies    Hallux rigidus of right foot 09/25/2022   Hammer toe    Left great toe   Hashimoto's thyroiditis    Per patient, diagnosed by Dr. Sharyne Pacini   Hearing loss of both ears 07/27/2015   mild to borderline moderate low frequency hearing loss  improving to within normal limits bilaterally on audiology testing at [facility] in November 2016.     History of Clostridioides difficile colitis, required fecal transplantation 12/20/2021   History of Clostridium difficile colitis 07/01/2015   Required Fecal Transplantation tocure   History of colonic polyps    History of left hip replacement 09/20/2017   History of revision of total hip arthroplasty 04/10/2018   Hx of bad fall 02/2015    Severe Facial/head trauma without fracture   Hyperhidrosis, scalp, 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   Hyperlipidemia 1998   Hypokalemia due to excessive gastrointestinal loss of potassium 07/28/2019   Hypothyroidism    Iliopsoas bursitis of left hip 05/08/2022   Impairment of balance 02/2015   Consequent of postconcussive syndrome   Injury of triangular fibrocartilage complex (TFCC) 02/25/2019   Injury of triangular fibrocartilage complex of left wrist 02/25/2019   Dx 02/25/19 Prentice Pagan IV MD ([facility])   Insulin  resistance 07/04/2017   Internal hemorrhoid 01/28/2021   Internal hemorrhoid seen on colonoscopy 10/2020 REGINOLD Sol MD [facility])   Interstitial cystitis    Irritable bowel syndrome with diarrhea 04/03/2016   Keratoconjunctivitis sicca 11/18/2018   (+) ANA     Left ventricular hypertrophy, mild 02/25/2016   ECHOcardiogram report 06/17/15 showing EF55-60%, mild LVH and G1DD    Loose total hip arthroplasty 03/10/2018   [facility]   Low serum vitamin B12 01/10/2023   Lumbar facet joint pain    Meibomian gland dysfunction (MGD) of both eyes 10/17/2021   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   Metatarsalgia of both feet 07/31/2022   Mood disorder    Morbid obesity (HCC) 03/06/2017   Morton's neuroma of right foot 09/15/2022   Musculoskeletal neck pain 07/14/2015   Nocturnal hypoxemia 03/06/2017   Normal coronary arteries 05/14/2014   Nuclear sclerosis of both eyes 10/17/2021   OAB (overactive bladder) 03/02/2021   Formatting of this note might be different from the original.  Added automatically from request for surgery [id number]     Formatting of this note might be different from the original.  Added automatically from request for surgery [id number]     Obesity (BMI 30.0-34.9) 01/29/2017   Odynophagia 12/20/2021   OSA (obstructive sleep apnea) 12/27/2022   Dr Dohmeier polysomnography titration study 04/27/24  There was no clinically significant apnea noted under 5 cm water  CPAP  pressure with an AHI of 0.7/h.  What was present was hypoxia- not severe, and loud snoring.   CMS AHI was 0/h under 6 cm water  CPAP which reduced the snoring somewhat and reduced hypoxia. Higher CPAP pressures induced more apneas and hypopneas.  The weight loss has reduced the   Osteoarthritis of acromioclavicular joint 02/22/2021   Osteoarthritis of left hip 11/01/2015   MRI order by Dr Ernie (ortho) 10/2015 showed significant arthritis of left hip joint with cystic changes in femoral head c/w osteoarthritis   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, lumbar region 10/30/2011   Other insomnia 11/09/2016   Overweight    Pain in joint of left shoulder 11/09/2016   Pain in joint, multiple sites 11/18/2018   Palpitations 05/15/2013   [facility] manages   Perianal candidiasis 12/20/2021   Periodic limb movement sleep disorder 03/28/2017   Perirectal cyst 05/07/2016   Plantar fasciitis of right foot 11/10/2022   Poison ivy dermatitis 03/24/2021   PONV (postoperative nausea and vomiting)    Positive ANA (antinuclear antibody) 11/18/2018   Post concussion syndrome 06/06/2015   Post concussive syndrome 07/14/2015  Ms Winnick's post-concussive syndrome manifesting in vertigo and headache, mood changes, poor balance, dizziness, and decreased concentration per Dr Ines at [facility].    Posterior vitreous detachment of right eye 2014   Premature menopause 12/20/2021   Primary osteoarthritis of left knee 06/20/2018   Psychophysiological insomnia 11/07/2022   Pure hypercholesterolemia 05/15/2013   Rectal abscess 05/10/2021   S/P left THA, AA 07/25/2016   S/P revision of total hip 04/10/2018   S/P tendon repair 01/23/2024   Sacroiliac inflammation 08/18/2021   Severe obstructive sleep apnea-hypopnea syndrome 12/22/2022   Shingles    Shortness of breath dyspnea    with exertion   Sicca syndrome 11/18/2018   (+) ANA   Sjogren syndrome 01/27/2019   Sjogren's  syndrome 01/27/2019   Sleep walking and eating 03/06/2017   Snoring 03/06/2017   Spondylosis of lumbar region without myelopathy or radiculopathy 10/30/2011   Status post total hip replacement, left 09/20/2017   TFC (triangular fibrocartilage complex) injury 02/25/2019   Thyroid  nodule 08/11/2009   Findings: The thyroid  gland is within normal limits in size.  The gland is diffusely inhomogeneous. A small solid nodule is noted in the lower pole  medially on the right of 7 x 6 x 8 mm. A small solid nodule is noted inferiorly on the left of 3 x 3 x 4 mm.  IMPRESSION:  The thyroid  gland is within normal limits in size with only small solid nodules present, the largest of only 8 mm in diameter on the right.     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    Trochanteric bursitis, right hip 04/26/2020   Tubular adenoma of colon 01/28/2021   Colonoscopy screening 4 mm tubular Adenoma polyp Marilu Sol, MD [facility])   Tubular adenoma of colon 01/28/2021   Colonoscopy screening 4 mm tubular Adenoma polyp Marilu Sol, MD [facility])   Vasomotor symptoms due to menopause 04/19/2017   Vertigo 03/19/2023   Vitamin D  deficiency 05/08/2017   Vulvitis 07/22/2020   Yeast vaginitis 07/11/2019   Past Surgical History:  Procedure Laterality Date   arthroscopy Left 01/2022   with SAD and DCR, K. Supple MD   Bladder dilitation     x 3   BREAST BIOPSY Right 2011   Benign histology   CARDIOVASCULAR STRESS TEST  2000   Unremarkable per pt report   CARPOMETACARPAL JOINT ARTHROTOMY Right 2011   COLONOSCOPY     COLONOSCOPY WITH PROPOFOL  N/A 04/21/2015   Procedure: COLONOSCOPY WITH PROPOFOL ;  Surgeon: Belvie Just, MD;  Location: [facility] ENDOSCOPY;  Service: Endoscopy;  Laterality: N/A;   CYSTOSCOPY W/ DILATION OF BLADDER N/A    EPIDURAL BLOCK INJECTION Left 04/12/2016   Left Medial Nerve Block and Left L5 ramus block, Dr Charlie Dolores     EPIDURAL BLOCK INJECTION  03/21/2016   Left L3-4 medial branch block and Left L5 & dorsal ramus block    EPIDURAL BLOCK INJECTION N/A 10/25/2016   Charlie Dolores, MD. Lumbar medial branch block   EPIDURAL BLOCK INJECTION N/A 02/09/2017   Charlie Dolores, MD.  Bilateral L3/4 medial branch block, bilateral L5 dorsal ramus block   EPIDURAL BLOCK INJECTION N/A 07/04/2017   Charlie Dolores, MD   EXCISION MORTON'S NEUROMA Right 05/03/2023   Procedure: EXCISION MORTON'S NEUROMA;  Surgeon: Kit Rush, MD;  Location: [facility];  Service: Orthopedics;  Laterality: Right;  general, local by surgeon 60 MIN   FECAL TRANSPLANT  04/21/2015   Procedure: FECAL TRANSPLANT;  Surgeon: Belvie Just, MD;  Location: [facility] ENDOSCOPY;  Service: Endoscopy;;   HIP ADDUCTOR TENOTOMY  01/2024   HIP ARTHROPLASTY Left    HIP ARTHROSCOPY Left 03/06/2018   Left hip arthroplasty, redo for loose hip arthroplasty. Procedure at [facility] hospital   INJECTION HIP INTRA ARTICULAR Left 11/2015   for OA by Dr Charlie Bonner REDBIRD IMPLANT PLACEMENT  04/2021   Norleen Sharps MD ([facility])   INTERSTIM IMPLANT REVISION N/A 09/2021   Norleen Sharps MD ([facility])   LIGAMENT REPAIR Right 05/03/2023   Procedure: COLLATERAL LIGAMENT REPAIR;  Surgeon: Kit Norleen, MD;  Location: [facility];  Service: Orthopedics;  Laterality: Right;  general, local by surgeon 60 MIN   OTHER SURGICAL HISTORY Left 2016   Left L3/L4 medial nerve block and Left L5 Dorsal Ramus block Dr FABIENE Bonner   TOTAL HIP ARTHROPLASTY Left 07/25/2016   Procedure: LEFT TOTAL HIP ARTHROPLASTY ANTERIOR APPROACH;  Surgeon: Donnice Car, MD;  Location: [facility];  Service: Orthopedics;  Laterality: Left;   WEIL OSTEOTOMY Right 05/03/2023   Procedure: RIGHT 2ND WEIL OSTEOTOMY;  Surgeon: Kit Norleen, MD;  Location: [facility];  Service: Orthopedics;  Laterality: Right;  general, local by surgeon 60 MIN   Patient  Active Problem List   Diagnosis Date Noted   Chronic pain of left upper extremity 02/19/2024   Arthritis of carpometacarpal [facility]) joint of left thumb 09/07/2023   BMI 26.0-26.9,adult 02/07/2023   B12 deficiency 01/10/2023   OSA (obstructive sleep apnea) 12/27/2022   Severe obstructive sleep apnea-hypopnea syndrome 12/22/2022   Hyperglycemia 12/11/2022   Generalized obesity 12/11/2022   SOBOE (shortness of breath on exertion) 12/11/2022   Other fatigue 12/11/2022   Sleep related headaches 11/07/2022   Intractable episodic cluster headache 11/07/2022   Psychophysiological insomnia 11/07/2022   Hallux rigidus of right foot 09/25/2022   Chronic pain of right knee 05/08/2022   Arthrosis of hand 12/20/2021   History of Clostridioides difficile colitis, required fecal transplantation 12/20/2021   Meibomian gland dysfunction (MGD) of both eyes 10/17/2021   Nuclear sclerosis of both eyes 10/17/2021   Epiretinal membrane (ERM) of left eye 10/17/2021   OAB (overactive bladder) 03/02/2021   Osteoarthritis of acromioclavicular joint 02/22/2021   Hyperhidrosis, scalp, 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   Chronic low back pain 05/14/2019   Sjogren syndrome 01/27/2019   DDD (degenerative disc disease), cervical 12/27/2018   Cervical spondylosis 11/18/2018   DDD (degenerative disc disease), lumbar 11/18/2018   Keratoconjunctivitis sicca 11/18/2018   Chronic contact dermatitis 08/06/2018   Primary osteoarthritis of left knee 06/20/2018   Insulin  resistance 07/04/2017   Vitamin D  deficiency 05/08/2017   Irritable bowel syndrome with diarrhea 04/03/2016   Left ventricular hypertrophy, mild 02/25/2016   Allergic rhinoconjunctivitis 07/02/2015   History of colonic polyps 07/01/2015   GERD (gastroesophageal reflux disease) 12/16/2014   Fibromyalgia 10/01/2013   Mood disorder 10/01/2013   Hyperlipidemia 10/01/2013   Pure hypercholesterolemia 05/15/2013   Chronic migraine without aura 05/15/2013   Benign  essential hypertension 10/28/2012   Hypothyroidism 11/09/2011   Chronic interstitial cystitis 11/09/2011    PCP: McDiarmid, Krystal MD  REFERRING PROVIDER:Supple, Franky MD  REFERRING DIAG: M25  THERAPY DIAG:  Cervicalgia  Chronic left shoulder pain  Muscle weakness (generalized)  Rationale for Evaluation and Treatment: Rehabilitation  ONSET DATE: chronic issue, worsened recently during and following surgery for hip  SUBJECTIVE:  SUBJECTIVE STATEMENT: Had hives from bladder medication.  Also had Cdiff.  Recovered from that now.  Tolerated DN to upper traps but too sensitive for multifidi and suboccipitals. 30% improved since start of care.  Hand dominance: Right  PERTINENT HISTORY:  History of PT for right hip pain and glute med tendon repair HTN, hypercholesterolemia, fibromyalgia (diagnosed decades ago), migraines usually on right side 3-4x/week gets an infusion every few months, arthritis, concussion  Left thumb bone spur Bout of vertigo 2024 summer Has 2# weights and red band but doesn't use, has to be careful to avoid flare up  PAIN:   Are you having pain? Yes NPRS scale: 3/10 Pain location: left neck especially upper trap Pain orientation: Left  PAIN TYPE: aching, burning, and throbbing Pain description: constant  Aggravating factors: stiff with driving, looking up, moving head, raising left arm Relieving factors: minimizing activity level, migraine med   PRECAUTIONS: Other: limit volume of ex secondary to fibromyalgia   WEIGHT BEARING RESTRICTIONS: No  FALLS:  Has patient fallen in last 6 months? No  PLOF: Independent  PATIENT GOALS: stop that nerve irritation; lower the pain  NEXT MD VISIT: after PT  OBJECTIVE:  Note: Objective measures were completed at  Evaluation unless otherwise noted.  DIAGNOSTIC FINDINGS:  Mild; FINDINGS: Alignment: Normal. There is no significant antero or retrolisthesis. There is no jumped or perched facet.   Skull base and vertebrae: Skull base alignment is maintained. Vertebral body heights are preserved. There is no evidence of acute injury. There is no suspicious osseous lesion.   Soft tissues and spinal canal: No prevertebral fluid or swelling. No visible canal hematoma.   Disc levels: There is mild intervertebral disc space narrowing with mild associated uncovertebral arthropathy at C5-C6. There is overall minimal facet arthropathy throughout the cervical spine. There is up to mild left neural foraminal stenosis at C5-C6. There is no other significant spinal canal or neural foraminal stenosis.   Upper chest: The imaged lung apices are clear.   Other: None.   IMPRESSION: Minimal degenerative changes at C5-C6 resulting in mild left neural foraminal stenosis. Otherwise, unremarkable cervical spine CT with no other significant spinal canal or neural foraminal stenosis.    PATIENT SURVEYS:  NDI:  12/17:  34%   NECK DISABILITY INDEX  Date: 10/15 Score  Pain intensity 3 = The pain is fairly severe at the moment  2. Personal care (washing, dressing, etc.) 0 = I can look after myself normally without causing extra pain  3. Lifting 3 = Pain prevents me from lifting heavy weights but I can manage light to medium   weights if they are conveniently positioned  4. Reading 2 =  I can read as much as I want with moderate pain in my neck  5. Headaches 3 = I have moderate headaches, which come frequently  6. Concentration 1 =  I can concentrate fully when I want to with slight difficulty   7. Work 3 =  I cannot do my usual work  8. Driving 2 =  I can drive my car as long as I want with moderate pain in my neck  9. Sleeping 1 = My sleep is slightly disturbed (less than 1 hr sleepless)  10. Recreation 1 =  I am  able to engage in all my recreation activities, with some pain in my neck  Total 38%   Minimum Detectable Change (90% confidence): 5 points or 10% points  COGNITION: Overall cognitive status: Within functional limits  for tasks assessed  PALPATION: Tender points left upper traps, left paraspinals, bil suboccipitals   CERVICAL ROM:   11/25:  flexion: 55, extension, 40 painful, 45 right, left 40, rotation 30 degrees and painful  Active ROM A/PROM (deg) eval 12/17  Flexion 55 75  Extension 45 pain 54  Right lateral flexion 42 pain 45  Left lateral flexion 32 40  Right rotation 30 42  Left rotation 30 42   (Blank rows = not tested)  UPPER EXTREMITY ROM:  Active ROM Right eval Left eval 12/17  Shoulder flexion 155 150 pain 155  Shoulder extension     Shoulder abduction 165 160 pain 158 pain left  Shoulder adduction     Shoulder extension     Shoulder internal rotation T2 T8   Shoulder external rotation C7 C7   Elbow flexion     Elbow extension     Wrist flexion     Wrist extension     Wrist ulnar deviation     Wrist radial deviation     Wrist pronation     Wrist supination      (Blank rows = not tested)  UPPER EXTREMITY MMT:  MMT Right eval Left eval 11/19 12/17  Shoulder flexion 5 4- 4 4+ L  Shoulder extension      Shoulder abduction 5 4- 4 4 L  Shoulder adduction      Shoulder extension      Shoulder internal rotation 5 4 4  4+ L  Shoulder external rotation 5 4 4  4+ L  Middle trapezius 4 4-    Lower trapezius 4 4-    Elbow flexion      Elbow extension      Wrist flexion      Wrist extension      Wrist ulnar deviation      Wrist radial deviation      Wrist pronation      Wrist supination      Grip strength       (Blank rows = not tested)  CERVICAL SPECIAL TESTS:  No effect with cervical distraction; +left shoulder painful arc; + Vonzell Berber  TREATMENT DATE 08/27/24: Status update including hives from a new medication and C-diff  diagnosis NDI Cervical ROM Shoulder ROM Review of progress toward goals Trigger Point Dry Needling Subsequent Treatment: Pt instructed on Dry Needling rational, procedures, and possible side effects. Pt instructed to expect mild to moderate muscle soreness later in the day and/or into the next day.  Pt instructed in methods to reduce muscle soreness. Pt instructed to continue prescribed HEP. Patient verbalized understanding of these instructions and education.  Patient Verbal Consent Given: Yes Education Handout Provided: Yes Muscles Treated: left upper trap; left levator scap; left middle deltoid Electrical Stimulation Performed: No Treatment Response/Outcome: improved soft tissue mobility 08/12/24: Review of status including response to MELT method Trigger Point Dry Needling Subsequent Treatment: Pt instructed on Dry Needling rational, procedures, and possible side effects. Pt instructed to expect mild to moderate muscle soreness later in the day and/or into the next day.  Pt instructed in methods to reduce muscle soreness. Pt instructed to continue prescribed HEP. Patient verbalized understanding of these instructions and education.  Patient Verbal Consent Given: Yes Education Handout Provided: Yes Muscles Treated: left upper trap; left levator scap Electrical Stimulation Performed: No Treatment Response/Outcome: improved soft tissue mobility Moist heat with ES to left cervical paraspinals and left upper traps to decrease post DN soreness 10 min  TREATMENT  DATE: 08/05/24 Cervical ROM as above Discussion of McKenzie cervical pillow for sleeping Review of foam roll ex's with recommendation of decreasing reps to 5-10 Supine IFC to bil cervical musculature with heat 6.0 to 6.5 ma 20 min  Pt given home TENS info    TREATMENT DATE: 07/30/24 supine thoracic extension with foam roll 10x MELT method cervical foam roll: rotation, circles, fig 8, nods 5x each 2 rounds with verbal  cues for technique Review of HEP and discussion of volume and areas of focus Trigger Point Dry Needling Subsequent Treatment: Pt instructed on Dry Needling rational, procedures, and possible side effects. Pt instructed to expect mild to moderate muscle soreness later in the day and/or into the next day.  Pt instructed in methods to reduce muscle soreness. Pt instructed to continue prescribed HEP. Patient verbalized understanding of these instructions and education.  Patient Verbal Consent Given: Yes Education Handout Provided: Yes Muscles Treated: bil cervical multifidi, bil suboccipitals;  left upper trap; left levator scap Electrical Stimulation Performed: No Treatment Response/Outcome: improved soft tissue mobility  TREATMENT DATE: 07/17/24 Seated thoracic extension with foam roll 10x Wall foam roll ups with Ues 5x Back to wall with foam vertically with open books, UE elevation, snow angels (cues for thumb up position with abduction due to shoulder discomfort) 5 reps each Karolynn pose with foam roll uncomfortable on shoulder discontinued at 3 reps MELT method cervical foam roll: rotation, circles, fig 8, nods 5x each Trigger Point Dry Needling Subsequent Treatment: Pt instructed on Dry Needling rational, procedures, and possible side effects. Pt instructed to expect mild to moderate muscle soreness later in the day and/or into the next day.  Pt instructed in methods to reduce muscle soreness. Pt instructed to continue prescribed HEP. Patient verbalized understanding of these instructions and education.  Patient Verbal Consent Given: Yes Education Handout Provided: Yes Muscles Treated: bil cervical multifidi, bil suboccipitals;  left upper trap Electrical Stimulation Performed: No Treatment Response/Outcome: improved soft tissue mobility  TREATMENT DATE: 07/01/24 Discussion of response to treatment and plan for session MELT method cervical foam roll: rotation, circles, fig 8, nods  5x each Seated upper trap and levator scap stretching Seated scap retraction Trigger Point Dry Needling Initial Treatment: Pt instructed on Dry Needling rational, procedures, and possible side effects. Pt instructed to expect mild to moderate muscle soreness later in the day and/or into the next day.  Pt instructed in methods to reduce muscle soreness. Pt instructed to continue prescribed HEP. Patient verbalized understanding of these instructions and education.  Patient Verbal Consent Given: Yes Education Handout Provided: Yes Muscles Treated: left only cervical multifidi, left upper trap Electrical Stimulation Performed: No Treatment Response/Outcome: improved soft tissue mobility     PATIENT EDUCATION:  Education details: Educated patient on anatomy and physiology of current symptoms, prognosis, plan of care as well as initial self care strategies to promote recovery Person educated: Patient Education method: Explanation Education comprehension: verbalized understanding  HOME EXERCISE PROGRAM: MELT method with foam roll ASSESSMENT:  CLINICAL IMPRESSION: Much improved cervical ROM in all planes since start of care.  NDI improved slightly.  30% overall improved reported.  Progress will be slower secondary to chronic pain and fibromyalgia however she is responding well to low repetitions of exercise and limited volume of DN and myofascial therapy.  The patient would benefit from a continuation of skilled PT for a further progression of strengthening and functional mobility.  Will continue to update and promote independence in a HEP needed for a  return to the highest functional level possible with ADLs.       OBJECTIVE IMPAIRMENTS: decreased activity tolerance, decreased ROM, decreased strength, increased fascial restrictions, impaired perceived functional ability, impaired UE functional use, and pain.   ACTIVITY LIMITATIONS: carrying, lifting, sleeping, and reach over  head  PARTICIPATION LIMITATIONS: cleaning, driving, and shopping  PERSONAL FACTORS: Time since onset of injury/illness/exacerbation and 3+ comorbidities: migraines, fibromyalgia, recent surgery are also affecting patient's functional outcome.   REHAB POTENTIAL: Good  CLINICAL DECISION MAKING: Evolving/moderate complexity  EVALUATION COMPLEXITY: Moderate   GOALS: Goals reviewed with patient? Yes  SHORT TERM GOALS: Target date: 07/23/2024   The patient will demonstrate knowledge of basic self care strategies and exercises to promote healing  Baseline:  Goal status: met 11/19  2.  Improved cervical rotation ROM to 35 degrees and sidebending to 45 degrees needed for driving Baseline:  Goal status: met 12/17  3.  The patient will report a 30% improvement in pain levels with functional activities which are currently difficult including carrying items and driving Baseline:  Goal status: met 12/7  4.  The patient will have grossly 4/5 strength in UE and periscapular muscles needed to carry items in left arm  Baseline:  Goal status: met 11/19     LONG TERM GOALS: Target date: 10/22/2024     The patient will be independent in a safe self progression of a home exercise program to promote further recovery of function  Baseline:  Goal status: ongoing  2.  The patient will report a 60% improvement in pain levels with functional activities which are currently difficult including carrying items and driving Baseline:  Goal status: ongoing  3.  The patient will have grossly 4+/5 strength needed to lift and lower a 3# object from a high shelf  Baseline:  Goal status: partially met  4.   Improved cervical rotation ROM to 40 degrees and sidebending to 50 degrees needed for driving Baseline:  Goal status: ongoing  5.  Neck Disability Index improved to 28% indicating improved function with less pain Baseline:  Goal status: ongoing    PLAN:  PT FREQUENCY: 1x/week  PT  DURATION: 8 weeks  PLANNED INTERVENTIONS: 02835- PT Re-evaluation, 97750- Physical Performance Testing, 97110-Therapeutic exercises, 97530- Therapeutic activity, 97112- Neuromuscular re-education, 97535- Self Care, 02859- Manual therapy, [phone]- Aquatic Therapy, [phone]- Electrical stimulation (unattended), [phone]- Electrical stimulation (manual), [id number]- Ultrasound, [id number]- Ionotophoresis 4mg /ml Dexamethasone , 79439 (1-2 muscles), [id number] (3+ muscles)- Dry Needling, Patient/Family education, Taping, Joint mobilization, Spinal mobilization, Cryotherapy, and Moist heat  PLAN FOR NEXT SESSION:   DN in moderation left upper traps, add levator, rhomboids as tolerated; foam roll MELT and thoracic mobility; low reps and volume of exercise secondary to sensitivity due to fibromyalgia  Glade Pesa, PT 08/27/2024 5:13 PM Phone: [phone] Fax: [phone]

## 2024-08-28 ENCOUNTER — Encounter: Payer: Self-pay | Admitting: Family Medicine

## 2024-08-28 ENCOUNTER — Other Ambulatory Visit: Payer: Self-pay | Admitting: Family Medicine

## 2024-08-28 DIAGNOSIS — Z8619 Personal history of other infectious and parasitic diseases: Secondary | ICD-10-CM

## 2024-08-28 DIAGNOSIS — A0471 Enterocolitis due to Clostridium difficile, recurrent: Secondary | ICD-10-CM

## 2024-08-28 DIAGNOSIS — N763 Subacute and chronic vulvitis: Secondary | ICD-10-CM

## 2024-08-28 DIAGNOSIS — A498 Other bacterial infections of unspecified site: Secondary | ICD-10-CM

## 2024-08-28 NOTE — Progress Notes (Signed)
 See patient messages. Improved diarrhea but not resolved.  Plan additional 10 days of vancomycine 125 mg capsule, one capsule four times a day.

## 2024-09-09 ENCOUNTER — Other Ambulatory Visit (HOSPITAL_COMMUNITY): Payer: Self-pay

## 2024-09-09 ENCOUNTER — Ambulatory Visit (INDEPENDENT_AMBULATORY_CARE_PROVIDER_SITE_OTHER): Payer: Self-pay | Admitting: Family Medicine

## 2024-09-09 ENCOUNTER — Encounter (INDEPENDENT_AMBULATORY_CARE_PROVIDER_SITE_OTHER): Payer: Self-pay | Admitting: Family Medicine

## 2024-09-09 VITALS — BP 117/83 | HR 90 | Temp 97.9°F | Ht 66.0 in | Wt 162.0 lb

## 2024-09-09 DIAGNOSIS — M797 Fibromyalgia: Secondary | ICD-10-CM

## 2024-09-09 DIAGNOSIS — Z6826 Body mass index (BMI) 26.0-26.9, adult: Secondary | ICD-10-CM

## 2024-09-09 DIAGNOSIS — A0471 Enterocolitis due to Clostridium difficile, recurrent: Secondary | ICD-10-CM

## 2024-09-09 DIAGNOSIS — G4733 Obstructive sleep apnea (adult) (pediatric): Secondary | ICD-10-CM | POA: Diagnosis not present

## 2024-09-09 DIAGNOSIS — E669 Obesity, unspecified: Secondary | ICD-10-CM | POA: Diagnosis not present

## 2024-09-09 DIAGNOSIS — A498 Other bacterial infections of unspecified site: Secondary | ICD-10-CM

## 2024-09-09 MED ORDER — ZEPBOUND 5 MG/0.5ML ~~LOC~~ SOAJ
5.0000 mg | SUBCUTANEOUS | 0 refills | Status: AC
  Filled 2024-09-09: qty 6, 84d supply, fill #0

## 2024-09-09 NOTE — Progress Notes (Signed)
 "  Office: [phone]  /  Fax: [phone]  WEIGHT SUMMARY AND BIOMETRICS  Anthropometric Measurements Height: 5' 6 (1.676 m) Weight: 162 lb (73.5 kg) BMI (Calculated): 26.16 Weight at Last Visit: 162lb Weight Lost Since Last Visit: 0lb Weight Gained Since Last Visit: 0lb Starting Weight: 199lb Total Weight Loss (lbs): 37 lb (16.8 kg) Peak Weight: 226lb   Body Composition  Body Fat %: 40.7 % Fat Mass (lbs): 66.2 lbs Muscle Mass (lbs): 91.4 lbs Total Body Water  (lbs): 68.8 lbs Visceral Fat Rating : 10   Other Clinical Data Fasting: No Labs: No Today's Visit #: 24 Starting Date: 12/11/22    Chief Complaint: OBESITY   History of Present Illness Molly Giles is a 68 year old female with obesity and polyphagia who presents for obesity treatment and progress assessment.  She has been prescribed a category one eating plan but has struggled to adhere to it over the past six weeks. Despite this, she has maintained her weight, including through the holiday season. She engages in physical activity by walking four days a week for ten minutes each session and is working on increasing her intake of protein, fruits, and vegetables.  She is being treated for polyphagia with Zepbound , currently taking 10 mg weekly. However, she has not taken Zepbound  since December 7th due to gastrointestinal issues. She experiences diarrhea after eating, which began around the time of her last Zepbound  dose, followed by vomiting. The diarrhea has persisted, described as loose and sometimes watery, impacting her daily life and sleep.  She has a history of Clostridioides difficile infection and is currently on her second round of treatment for C. diff, having previously undergone a fecal transplant and taken Dificid . The infection typically recurs quickly after stopping treatment.  She has a history of fibromyalgia and has previously been under the care of a rheumatologist. She mentions past  treatment with Ambien  (zolpidem ) for sleep issues related to fibromyalgia, which she no longer takes regularly. She also reports a history of anxiety, which was exacerbated by family stressors.  She is currently hydrating with [employer] water  to maintain electrolyte balance due to ongoing diarrhea. No recent electrolyte checks have been performed.      PHYSICAL EXAM:  Blood pressure 117/83, pulse 90, temperature 97.9 F (36.6 C), height 5' 6 (1.676 m), weight 162 lb (73.5 kg), SpO2 100%. Body mass index is 26.15 kg/m.  DIAGNOSTIC DATA REVIEWED:  BMET    Component Value Date/Time   NA 140 07/03/2024 1102   K 4.2 07/03/2024 1102   CL 105 07/03/2024 1102   CO2 22 07/03/2024 1102   GLUCOSE 73 07/03/2024 1102   GLUCOSE 93 07/05/2019 1629   BUN 17 07/03/2024 1102   CREATININE 0.82 07/03/2024 1102   CREATININE 0.74 02/24/2016 1158   CALCIUM  9.2 07/03/2024 1102   GFRNONAA 65 07/24/2019 1116   GFRNONAA 88 02/24/2016 1158   GFRAA 75 07/24/2019 1116   GFRAA >89 02/24/2016 1158   Lab Results  Component Value Date   HGBA1C 5.2 09/10/2023   HGBA1C 5.3 03/22/2017   Lab Results  Component Value Date   INSULIN  9.5 07/03/2024   INSULIN  10.5 03/22/2017   Lab Results  Component Value Date   TSH 2.290 10/18/2023   CBC    Component Value Date/Time   WBC 6.0 07/03/2024 1102   WBC 6.6 07/05/2019 1629   RBC 4.37 07/03/2024 1102   RBC 4.26 08/04/2020 0000   HGB 13.4 07/03/2024 1102   HCT 41.5  07/03/2024 1102   PLT 244 07/03/2024 1102   MCV 95 07/03/2024 1102   MCH 30.7 07/03/2024 1102   MCH 30.0 07/05/2019 1629   MCHC 32.3 07/03/2024 1102   MCHC 31.6 07/05/2019 1629   RDW 13.1 07/03/2024 1102   Iron  Studies No results found for: IRON , TIBC, FERRITIN, IRONPCTSAT Lipid Panel     Component Value Date/Time   CHOL 171 07/03/2024 1102   TRIG 77 07/03/2024 1102   HDL 53 07/03/2024 1102   CHOLHDL 3.3 10/11/2021 1142   CHOLHDL 3.9 12/14/2014 1036   VLDL 26  12/14/2014 1036   LDLCALC 103 (H) 07/03/2024 1102   Hepatic Function Panel     Component Value Date/Time   PROT 6.5 07/03/2024 1102   ALBUMIN 4.5 07/03/2024 1102   AST 21 07/03/2024 1102   ALT 13 07/03/2024 1102   ALKPHOS 104 07/03/2024 1102   BILITOT 0.4 07/03/2024 1102   BILIDIR 0.14 02/12/2020 1056      Component Value Date/Time   TSH 2.290 10/18/2023 1212   Nutritional Lab Results  Component Value Date   VD25OH 54.4 07/03/2024   VD25OH 103.0 (H) 09/10/2023   VD25OH 41.2 12/11/2022     Assessment and Plan Assessment & Plan Obesity Management is ongoing with a focus on dietary adherence and physical activity. She has maintained her weight over the last six weeks despite challenges during Thanksgiving and Christmas. She is walking for exercise four days a week for ten minutes and is working on increasing protein intake and consuming more fruits and vegetables. - Continue current exercise regimen and dietary modifications.  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 Symptoms include diarrhea and hives. Currently on the second round of antibiotics. Concerns about potential long-term damage and recurrence. Gastroenterologist advised holding Zepbound  until diarrhea resolves for four weeks. Discussed the risk of prolonged GI tract retention of bacteria due to Zepbound 's effects, potentially exacerbating the infection. - Hold Zepbound  until diarrhea resolves for four weeks. - Checked electrolytes to assess for dehydration. - Follow up with gastroenterologist regarding potential long-term damage and recurrence prevention.  Obstructive sleep apnea Managed with Zepbound . Current flare-up of C. difficile infection necessitates holding Zepbound  until resolution of symptoms. Plan to restart Zepbound  at a lower dose of 5 mg weekly after a period off medication. - Hold Zepbound  until resolution of  C. difficile symptoms. - Restart Zepbound  at 5 mg weekly after four weeks off medication.  Fibromyalgia Chronic fibromyalgia with recent exacerbation due to C. difficile infection. Previous treatment included zolpidem , but she is currently not under rheumatological care. She expresses interest in exploring new treatment options and understanding mast cell activation syndrome. - Referred to rheumatology for evaluation and discussion of new treatment options for fibromyalgia.      Patients who are on anti-obesity medications are counseled on the importance of maintaining healthy lifestyle habits, including balanced nutrition, regular physical activity, and behavioral modifications,  Medication is an adjunct to, not a replacement for, lifestyle changes and that the long-term success and weight maintenance depend on continued adherence to these strategies.   Anivea was informed of the importance of frequent follow up visits to maximize her success with intensive lifestyle modifications for her obesity and obesity related health conditions as recommended by USPSTF and CMS guidelines Molly Penton, MD   "

## 2024-09-10 ENCOUNTER — Encounter: Payer: Self-pay | Admitting: Physical Therapy

## 2024-09-10 ENCOUNTER — Ambulatory Visit: Attending: Orthopedic Surgery | Admitting: Physical Therapy

## 2024-09-10 DIAGNOSIS — G8929 Other chronic pain: Secondary | ICD-10-CM | POA: Insufficient documentation

## 2024-09-10 DIAGNOSIS — M25512 Pain in left shoulder: Secondary | ICD-10-CM | POA: Diagnosis present

## 2024-09-10 DIAGNOSIS — M6281 Muscle weakness (generalized): Secondary | ICD-10-CM | POA: Insufficient documentation

## 2024-09-10 DIAGNOSIS — M542 Cervicalgia: Secondary | ICD-10-CM | POA: Insufficient documentation

## 2024-09-10 LAB — CMP14+EGFR
ALT: 19 IU/L (ref 0–32)
AST: 21 IU/L (ref 0–40)
Albumin: 4.4 g/dL (ref 3.9–4.9)
Alkaline Phosphatase: 83 IU/L (ref 49–135)
BUN/Creatinine Ratio: 26 (ref 12–28)
BUN: 18 mg/dL (ref 8–27)
Bilirubin Total: 0.3 mg/dL (ref 0.0–1.2)
CO2: 21 mmol/L (ref 20–29)
Calcium: 9.7 mg/dL (ref 8.7–10.3)
Chloride: 102 mmol/L (ref 96–106)
Creatinine, Ser: 0.7 mg/dL (ref 0.57–1.00)
Globulin, Total: 2.4 g/dL (ref 1.5–4.5)
Glucose: 76 mg/dL (ref 70–99)
Potassium: 4.5 mmol/L (ref 3.5–5.2)
Sodium: 141 mmol/L (ref 134–144)
Total Protein: 6.8 g/dL (ref 6.0–8.5)
eGFR: 94 mL/min/1.73

## 2024-09-10 NOTE — Therapy (Addendum)
 " OUTPATIENT PHYSICAL THERAPY CERVICAL/SHOULDER PROGRESS NOTE/DISCHARGE SUMMARY   Patient Name: Molly Giles MRN: [id number] DOB:April 29, 1956, 68 y.o., female Today's Date: 09/10/2024  END OF SESSION:  PT End of Session - 09/10/24 0932     Visit Number 8    Date for Recertification  10/22/24    Authorization Type medicare and BCBS supplemental ABN signed for December for DN    Progress Note Due on Visit 10    PT Start Time 0932    PT Stop Time 1014    PT Time Calculation (min) 42 min    Activity Tolerance Patient tolerated treatment well          Past Medical History:  Diagnosis Date   Abdominal bloating 05/10/2021   Abdominal discomfort, generalized 05/07/2016   Acquired hallux rigidus of right foot 09/15/2022   Acute gastritis 11/19/2020   Allergic rhinoconjunctivitis 07/02/2015   Anal fissure 11/19/2020   Anterior to posterior tear of superior glenoid labrum of left shoulder 02/22/2021   Arthritis    Arthritis of carpometacarpal (CMC) joint of left thumb 09/07/2023   Aseptic loosening of prosthetic hip, initial encounter 11/29/2017   Benign essential hypertension 10/28/2012   Benign positional vertigo 04/2015   Responded well to [facility]   Bruxism (teeth grinding)    Cervical spondylosis 11/18/2018   Chronic contact dermatitis 08/06/2018   Per allergist Dr Almarie Scala.    Chronic interstitial cystitis 11/09/2011   Managed by [facility] urology branch in [place] hydrodistention 5 years ago with Dr Claudene in [place], has relief of symptoms until now     Chronic low back pain 05/14/2019   Chronic migraine 02/25/2016   Per Dr Ines review in notes from [facility] from September 2015. Showed total headache days last month 18. Severe headache days 7 days. Moderate headache days 5 days. Mild headache days last month sick days. Days without headache last month 10 days. Symptoms associated with photophobia, phonophobia, osmophobia, neck pain, dizziness,  jaw pain, nasal congestion, vision disturbances, tingling and numbness, weakness and worsening with activity. Each headache attack last 3 hours depending on treatment in severity. Left side, the right side, easier side, the frontal area in the back of the head. Characterized as throbbing, pressure, tightness, squeezing, stabbing and burning    Chronic migraine without aura 05/15/2013    Dr Ines [facility]   Chronic tonsillitis 01/27/2019   DDD (degenerative disc disease), cervical 11/18/2018   DDD (degenerative disc disease), lumbar    Depression    Disc displacement, lumbar    Dysphagia 10/11/2021   Dysrhythmia    seen by dr Blanca- not a problem since she has been on Bystolic    Epiretinal membrane (ERM) of left eye 10/17/2021   Periodic ophthalmological monitoring     Episodic cluster headache, not intractable 03/06/2017   Essential hypertension 05/15/2013   Family history of adverse reaction to anesthesia    Brother- N/V   Family history of premature CAD 05/15/2013   Fibromyalgia 10/01/2013   Management by Dr GORMAN. Devashwar (Rhe[facility])     Fibromyalgia syndrome 07/01/2015   Management by Dr GORMAN. Devashwar ([facility])    GERD (gastroesophageal reflux disease) 12/16/2014   H/O seasonal allergies    Hallux rigidus of right foot 09/25/2022   Hammer toe    Left great toe   Hashimoto's thyroiditis    Per patient, diagnosed by Dr. Sharyne Pacini   Hearing loss of both ears 07/27/2015   mild to borderline moderate low frequency  hearing loss improving to within normal limits bilaterally on audiology testing at [facility] in November 2016.     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   Hyperlipidemia 1998   Hypokalemia due to excessive gastrointestinal loss of potassium 07/28/2019   Hypothyroidism    Iliopsoas bursitis of left hip 05/08/2022   Impairment of balance 02/2015   Consequent of postconcussive syndrome   Injury of triangular fibrocartilage complex (TFCC) 02/25/2019   Injury of triangular fibrocartilage complex of left wrist 02/25/2019   Dx 02/25/19 Prentice Pagan IV MD ([facility])   Insulin  resistance 07/04/2017   Internal hemorrhoid 01/28/2021   Internal hemorrhoid seen on colonoscopy 10/2020 REGINOLD Sol MD [facility])   Interstitial cystitis    Irritable bowel syndrome with diarrhea 04/03/2016   Keratoconjunctivitis sicca 11/18/2018   (+) ANA     Left ventricular hypertrophy, mild 02/25/2016   ECHOcardiogram report 06/17/15 showing EF55-60%, mild LVH and G1DD    Loose total hip arthroplasty 03/10/2018   [facility]   Low serum vitamin B12 01/10/2023   Lumbar facet joint pain    Meibomian gland dysfunction (MGD) of both eyes 10/17/2021   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   Metatarsalgia of both feet 07/31/2022   Mood disorder    Morbid obesity (HCC) 03/06/2017   Morton's neuroma of right foot 09/15/2022   Musculoskeletal neck pain 07/14/2015   Nocturnal hypoxemia 03/06/2017   Normal coronary arteries 05/14/2014   Nuclear sclerosis of both eyes 10/17/2021   OAB (overactive bladder) 03/02/2021   Formatting of this note might be different from the original.  Added automatically from request for surgery [id number]     Formatting of this note might be different from the original.  Added automatically from request for surgery [id number]     Obesity (BMI 30.0-34.9) 01/29/2017   Odynophagia 12/20/2021   OSA (obstructive sleep apnea) 12/27/2022   Dr Dohmeier polysomnography titration study 04/27/24  There was no clinically significant apnea noted under 5 cm  water  CPAP pressure with an AHI of 0.7/h.  What was present was hypoxia- not severe, and loud snoring.   CMS AHI was 0/h under 6 cm water  CPAP which reduced the snoring somewhat and reduced hypoxia. Higher CPAP pressures induced more apneas and hypopneas.  The weight loss has reduced the   Osteoarthritis of acromioclavicular joint 02/22/2021   Osteoarthritis of left hip 11/01/2015   MRI order by Dr Ernie (ortho) 10/2015 showed significant arthritis of left hip joint with cystic changes in femoral head c/w osteoarthritis   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, lumbar region 10/30/2011   Other insomnia 11/09/2016   Overweight    Pain in joint of left shoulder 11/09/2016   Pain in joint, multiple sites 11/18/2018   Palpitations 05/15/2013   [facility] manages   Perianal candidiasis 12/20/2021   Periodic limb movement sleep disorder 03/28/2017   Perirectal cyst 05/07/2016   Plantar fasciitis of right foot 11/10/2022   Poison ivy dermatitis 03/24/2021   PONV (postoperative nausea and vomiting)    Positive ANA (antinuclear antibody) 11/18/2018   Post concussion syndrome 06/06/2015   Post concussive  syndrome 07/14/2015   Ms [place]'s post-concussive syndrome manifesting in vertigo and headache, mood changes, poor balance, dizziness, and decreased concentration per Dr Ines at [facility].    Posterior vitreous detachment of right eye 2014   Premature menopause 12/20/2021   Primary osteoarthritis of left knee 06/20/2018   Psychophysiological insomnia 11/07/2022   Pure hypercholesterolemia 05/15/2013   Rectal abscess 05/10/2021   S/P left THA, AA 07/25/2016   S/P revision of total hip 04/10/2018   S/P tendon repair 01/23/2024   Sacroiliac inflammation 08/18/2021   Severe obstructive sleep apnea-hypopnea syndrome 12/22/2022   Shingles    Shortness of breath dyspnea    with exertion   Sicca syndrome 11/18/2018   (+) ANA   Sjogren syndrome 01/27/2019    Sjogren's syndrome 01/27/2019   Sleep walking and eating 03/06/2017   Snoring 03/06/2017   Spondylosis of lumbar region without myelopathy or radiculopathy 10/30/2011   Status post total hip replacement, left 09/20/2017   TFC (triangular fibrocartilage complex) injury 02/25/2019   Thyroid  nodule 08/11/2009   Findings: The thyroid  gland is within normal limits in size.  The gland is diffusely inhomogeneous. A small solid nodule is noted in the lower pole  medially on the right of 7 x 6 x 8 mm. A small solid nodule is noted inferiorly on the left of 3 x 3 x 4 mm.  IMPRESSION:  The thyroid  gland is within normal limits in size with only small solid nodules present, the largest of only 8 mm in diameter on the right.     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    Trochanteric bursitis, right hip 04/26/2020   Tubular adenoma of colon 01/28/2021   Colonoscopy screening 4 mm tubular Adenoma polyp Marilu Sol, MD [facility])   Tubular adenoma of colon 01/28/2021   Colonoscopy screening 4 mm tubular Adenoma polyp Marilu Sol, MD [facility])   Vasomotor symptoms due to menopause 04/19/2017   Vertigo 03/19/2023   Vitamin D  deficiency 05/08/2017   Vulvitis 07/22/2020   Yeast vaginitis 07/11/2019   Past Surgical History:  Procedure Laterality Date   arthroscopy Left 01/2022   with SAD and DCR, K. Supple MD   Bladder dilitation     x 3   BREAST BIOPSY Right 2011   Benign histology   CARDIOVASCULAR STRESS TEST  2000   Unremarkable per pt report   CARPOMETACARPAL JOINT ARTHROTOMY Right 2011   COLONOSCOPY     COLONOSCOPY WITH PROPOFOL  N/A 04/21/2015   Procedure: COLONOSCOPY WITH PROPOFOL ;  Surgeon: Belvie Just, MD;  [facility] END[facility]COPY;  Service: Endoscopy;  Laterality: N/A;   CYSTOSCOPY W/ DILATION OF BLADDER N/A    EPIDURAL BLOCK INJECTION Left 04/12/2016   Left Medial Nerve Block and Left L5 ramus block, Dr Charlie Dolores     EPIDURAL BLOCK INJECTION  03/21/2016   Left L3-4 medial branch block and Left L5 & dorsal ramus block    EPIDURAL BLOCK INJECTION N/A 10/25/2016   Charlie Dolores, MD. Lumbar medial branch block   EPIDURAL BLOCK INJECTION N/A 02/09/2017   Charlie Dolores, MD.  Bilateral L3/4 medial branch block, bilateral L5 dorsal ramus block   EPIDURAL BLOCK INJECTION N/A 07/04/2017   Charlie Dolores, MD   EXCISION MORTON'S NEUROMA Right 05/03/2023   Procedure: EXCISION MORTON'S NEUROMA;  Surgeon: Kit Rush, MD;  Location: [facility];  Service: Orthopedics;  Laterality: Right;  general, local by surgeon 60 MIN  FECAL TRANSPLANT  04/21/2015   Procedure: FECAL TRANSPLANT;  Surgeon: Belvie Just, MD;  Location: [facility] ENDOSCOPY;  Service: Endoscopy;;   HIP ADDUCTOR TENOTOMY  01/2024   HIP ARTHROPLASTY Left    HIP ARTHROSCOPY Left 03/06/2018   Left hip arthroplasty, redo for loose hip arthroplasty. Procedure at [facility] hospital   INJECTION HIP INTRA ARTICULAR Left 11/2015   for OA by Dr Charlie Bonner REDBIRD IMPLANT PLACEMENT  04/2021   Norleen Sharps MD ([facility])   INTERSTIM IMPLANT REVISION N/A 09/2021   Norleen Sharps MD ([facility])   LIGAMENT REPAIR Right 05/03/2023   Procedure: COLLATERAL LIGAMENT REPAIR;  Surgeon: Kit Norleen, MD;  Location: [facility];  Service: Orthopedics;  Laterality: Right;  general, local by surgeon 60 MIN   OTHER SURGICAL HISTORY Left 2016   Left L3/L4 medial nerve block and Left L5 Dorsal Ramus block Dr FABIENE Bonner   TOTAL HIP ARTHROPLASTY Left 07/25/2016   Procedure: LEFT TOTAL HIP ARTHROPLASTY ANTERIOR APPROACH;  Surgeon: Donnice Car, MD;  Location: [facility];  Service: Orthopedics;  Laterality: Left;   WEIL OSTEOTOMY Right 05/03/2023   Procedure: RIGHT 2ND WEIL OSTEOTOMY;  Surgeon: Kit Norleen, MD;  Location: [facility];  Service: Orthopedics;  Laterality: Right;  general, local by surgeon 60 MIN    Patient Active Problem List   Diagnosis Date Noted   Chronic pain of left upper extremity 02/19/2024   Arthritis of carpometacarpal [facility]) joint of left thumb 09/07/2023   BMI 26.0-26.9,adult 02/07/2023   B12 deficiency 01/10/2023   OSA (obstructive sleep apnea) 12/27/2022   Severe obstructive sleep apnea-hypopnea syndrome 12/22/2022   Hyperglycemia 12/11/2022   Generalized obesity 12/11/2022   SOBOE (shortness of breath on exertion) 12/11/2022   Other fatigue 12/11/2022   Sleep related headaches 11/07/2022   Intractable episodic cluster headache 11/07/2022   Psychophysiological insomnia 11/07/2022   Hallux rigidus of right foot 09/25/2022   Chronic pain of right knee 05/08/2022   Arthrosis of hand 12/20/2021   History of Clostridioides difficile colitis, required fecal transplantation 12/20/2021   Meibomian gland dysfunction (MGD) of both eyes 10/17/2021   Nuclear sclerosis of both eyes 10/17/2021   Epiretinal membrane (ERM) of left eye 10/17/2021   OAB (overactive bladder) 03/02/2021   Osteoarthritis of acromioclavicular joint 02/22/2021   Hyperhidrosis, scalp, 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   Chronic low back pain 05/14/2019   Sjogren syndrome 01/27/2019   DDD (degenerative disc disease), cervical 12/27/2018   Cervical spondylosis 11/18/2018   DDD (degenerative disc disease), lumbar 11/18/2018   Keratoconjunctivitis sicca 11/18/2018   Chronic contact dermatitis 08/06/2018   Primary osteoarthritis of left knee 06/20/2018   Insulin  resistance 07/04/2017   Vitamin D  deficiency 05/08/2017   Irritable bowel syndrome with diarrhea 04/03/2016   Left ventricular hypertrophy, mild 02/25/2016   Allergic rhinoconjunctivitis 07/02/2015   History of colonic polyps 07/01/2015   GERD (gastroesophageal reflux disease) 12/16/2014   Fibromyalgia 10/01/2013   Mood disorder 10/01/2013   Hyperlipidemia 10/01/2013   Pure hypercholesterolemia 05/15/2013   Chronic migraine without aura 05/15/2013    Benign essential hypertension 10/28/2012   Hypothyroidism 11/09/2011   Chronic interstitial cystitis 11/09/2011    PCP: McDiarmid, Krystal MD  REFERRING PROVIDER:Supple, Franky MD  REFERRING DIAG: M25  THERAPY DIAG:  Cervicalgia  Chronic left shoulder pain  Muscle weakness (generalized)  Rationale for Evaluation and Treatment: Rehabilitation  ONSET DATE: chronic issue, worsened recently during and following surgery for hip  SUBJECTIVE:  SUBJECTIVE STATEMENT: DN helps short term.  Brought in herTENS unit for instruction.  Unsure how to use it.  Hand dominance: Right  PERTINENT HISTORY:  History of PT for right hip pain and glute med tendon repair HTN, hypercholesterolemia, fibromyalgia (diagnosed decades ago), migraines usually on right side 3-4x/week gets an infusion every few months, arthritis, concussion  Left thumb bone spur Bout of vertigo 2024 summer Has 2# weights and red band but doesn't use, has to be careful to avoid flare up  PAIN:   Are you having pain? Yes NPRS scale: 3/10 Pain location: left neck especially upper trap Pain orientation: Left  PAIN TYPE: aching, burning, and throbbing Pain description: constant  Aggravating factors: stiff with driving, looking up, moving head, raising left arm Relieving factors: minimizing activity level, migraine med   PRECAUTIONS: Other: limit volume of ex secondary to fibromyalgia   WEIGHT BEARING RESTRICTIONS: No  FALLS:  Has patient fallen in last 6 months? No  PLOF: Independent  PATIENT GOALS: stop that nerve irritation; lower the pain  NEXT MD VISIT: after PT  OBJECTIVE:  Note: Objective measures were completed at Evaluation unless otherwise noted.  DIAGNOSTIC FINDINGS:  Mild; FINDINGS: Alignment: Normal.  There is no significant antero or retrolisthesis. There is no jumped or perched facet.   Skull base and vertebrae: Skull base alignment is maintained. Vertebral body heights are preserved. There is no evidence of acute injury. There is no suspicious osseous lesion.   Soft tissues and spinal canal: No prevertebral fluid or swelling. No visible canal hematoma.   Disc levels: There is mild intervertebral disc space narrowing with mild associated uncovertebral arthropathy at C5-C6. There is overall minimal facet arthropathy throughout the cervical spine. There is up to mild left neural foraminal stenosis at C5-C6. There is no other significant spinal canal or neural foraminal stenosis.   Upper chest: The imaged lung apices are clear.   Other: None.   IMPRESSION: Minimal degenerative changes at C5-C6 resulting in mild left neural foraminal stenosis. Otherwise, unremarkable cervical spine CT with no other significant spinal canal or neural foraminal stenosis.    PATIENT SURVEYS:  NDI:  12/17:  34%   NECK DISABILITY INDEX  Date: 10/15 Score  Pain intensity 3 = The pain is fairly severe at the moment  2. Personal care (washing, dressing, etc.) 0 = I can look after myself normally without causing extra pain  3. Lifting 3 = Pain prevents me from lifting heavy weights but I can manage light to medium   weights if they are conveniently positioned  4. Reading 2 =  I can read as much as I want with moderate pain in my neck  5. Headaches 3 = I have moderate headaches, which come frequently  6. Concentration 1 =  I can concentrate fully when I want to with slight difficulty   7. Work 3 =  I cannot do my usual work  8. Driving 2 =  I can drive my car as long as I want with moderate pain in my neck  9. Sleeping 1 = My sleep is slightly disturbed (less than 1 hr sleepless)  10. Recreation 1 =  I am able to engage in all my recreation activities, with some pain in my neck  Total 38%   Minimum  Detectable Change (90% confidence): 5 points or 10% points  COGNITION: Overall cognitive status: Within functional limits for tasks assessed  PALPATION: Tender points left upper traps, left paraspinals, bil suboccipitals  CERVICAL ROM:   11/25:  flexion: 55, extension, 40 painful, 45 right, left 40, rotation 30 degrees and painful  Active ROM A/PROM (deg) eval 12/17  Flexion 55 75  Extension 45 pain 54  Right lateral flexion 42 pain 45  Left lateral flexion 32 40  Right rotation 30 42  Left rotation 30 42   (Blank rows = not tested)  UPPER EXTREMITY ROM:  Active ROM Right eval Left eval 12/17  Shoulder flexion 155 150 pain 155  Shoulder extension     Shoulder abduction 165 160 pain 158 pain left  Shoulder adduction     Shoulder extension     Shoulder internal rotation T2 T8   Shoulder external rotation C7 C7   Elbow flexion     Elbow extension     Wrist flexion     Wrist extension     Wrist ulnar deviation     Wrist radial deviation     Wrist pronation     Wrist supination      (Blank rows = not tested)  UPPER EXTREMITY MMT:  MMT Right eval Left eval 11/19 12/17  Shoulder flexion 5 4- 4 4+ L  Shoulder extension      Shoulder abduction 5 4- 4 4 L  Shoulder adduction      Shoulder extension      Shoulder internal rotation 5 4 4  4+ L  Shoulder external rotation 5 4 4  4+ L  Middle trapezius 4 4-    Lower trapezius 4 4-    Elbow flexion      Elbow extension      Wrist flexion      Wrist extension      Wrist ulnar deviation      Wrist radial deviation      Wrist pronation      Wrist supination      Grip strength       (Blank rows = not tested)  CERVICAL SPECIAL TESTS:  No effect with cervical distraction; +left shoulder painful arc; + Vonzell Berber  TREATMENT DATE 09/10/24: Instruction in use of her home TENS unit including applying electrodes, wear time, selecting a mode, setting the intensity Unit on for duration of exercise Discussion of  McKenzie cervical roll for sleeping Cervical rotation SNAG with towel 10x right/left (Added to HEP- see below) Supine foam roll  cervical rotation (Added to HEP- see below) Supine and seated versions of foam roll thoracic extension (Added to HEP- see below)     08/27/24: Status update including hives from a new medication and C-diff diagnosis NDI Cervical ROM Shoulder ROM Review of progress toward goals Trigger Point Dry Needling Subsequent Treatment: Pt instructed on Dry Needling rational, procedures, and possible side effects. Pt instructed to expect mild to moderate muscle soreness later in the day and/or into the next day.  Pt instructed in methods to reduce muscle soreness. Pt instructed to continue prescribed HEP. Patient verbalized understanding of these instructions and education.  Patient Verbal Consent Given: Yes Education Handout Provided: Yes Muscles Treated: left upper trap; left levator scap; left middle deltoid Electrical Stimulation Performed: No Treatment Response/Outcome: improved soft tissue mobility 08/12/24: Review of status including response to MELT method Trigger Point Dry Needling Subsequent Treatment: Pt instructed on Dry Needling rational, procedures, and possible side effects. Pt instructed to expect mild to moderate muscle soreness later in the day and/or into the next day.  Pt instructed in methods to reduce muscle soreness. Pt instructed to continue prescribed  HEP. Patient verbalized understanding of these instructions and education.  Patient Verbal Consent Given: Yes Education Handout Provided: Yes Muscles Treated: left upper trap; left levator scap Electrical Stimulation Performed: No Treatment Response/Outcome: improved soft tissue mobility Moist heat with ES to left cervical paraspinals and left upper traps to decrease post DN soreness 10 min  TREATMENT DATE: 08/05/24 Cervical ROM as above Discussion of McKenzie cervical pillow for  sleeping Review of foam roll ex's with recommendation of decreasing reps to 5-10 Supine IFC to bil cervical musculature with heat 6.0 to 6.5 ma 20 min  Pt given home TENS info    TREATMENT DATE: 07/30/24 supine thoracic extension with foam roll 10x MELT method cervical foam roll: rotation, circles, fig 8, nods 5x each 2 rounds with verbal cues for technique Review of HEP and discussion of volume and areas of focus Trigger Point Dry Needling Subsequent Treatment: Pt instructed on Dry Needling rational, procedures, and possible side effects. Pt instructed to expect mild to moderate muscle soreness later in the day and/or into the next day.  Pt instructed in methods to reduce muscle soreness. Pt instructed to continue prescribed HEP. Patient verbalized understanding of these instructions and education.  Patient Verbal Consent Given: Yes Education Handout Provided: Yes Muscles Treated: bil cervical multifidi, bil suboccipitals;  left upper trap; left levator scap Electrical Stimulation Performed: No Treatment Response/Outcome: improved soft tissue mobility  TREATMENT DATE: 07/17/24 Seated thoracic extension with foam roll 10x Wall foam roll ups with Ues 5x Back to wall with foam vertically with open books, UE elevation, snow angels (cues for thumb up position with abduction due to shoulder discomfort) 5 reps each Karolynn pose with foam roll uncomfortable on shoulder discontinued at 3 reps MELT method cervical foam roll: rotation, circles, fig 8, nods 5x each Trigger Point Dry Needling Subsequent Treatment: Pt instructed on Dry Needling rational, procedures, and possible side effects. Pt instructed to expect mild to moderate muscle soreness later in the day and/or into the next day.  Pt instructed in methods to reduce muscle soreness. Pt instructed to continue prescribed HEP. Patient verbalized understanding of these instructions and education.  Patient Verbal Consent Given:  Yes Education Handout Provided: Yes Muscles Treated: bil cervical multifidi, bil suboccipitals;  left upper trap Electrical Stimulation Performed: No Treatment Response/Outcome: improved soft tissue mobility      PATIENT EDUCATION:  Education details: Educated patient on anatomy and physiology of current symptoms, prognosis, plan of care as well as initial self care strategies to promote recovery Person educated: Patient Education method: Explanation Education comprehension: verbalized understanding  HOME EXERCISE PROGRAM: Access Code: [email or web address] URL: [email or web address] Date: 09/10/2024 Prepared by: Glade Pesa  Exercises - Seated Assisted Cervical Rotation with Towel  - 1 x daily - 7 x weekly - 1 sets - 10 reps - Neck Mobilization with Foam Roller  - 1 x daily - 7 x weekly - 1 sets - 10 reps - Thoracic Mobilization on Foam Roll - Hands Clasped  - 1 x daily - 7 x weekly - 1 sets - 10 reps - Seated Thoracic Lumbar Extension with Pectoralis Stretch  - 1 x daily - 7 x weekly - 1 sets - 10 reps ASSESSMENT:  CLINICAL IMPRESSION: Treatment focus on strategies for pain control for ongoing myofascial chronic pain. Instruction in use of home TENS unit as well as educating on use of cervical roll for sleeping. Updated HEP for ROM emphasizing cervical rotation and thoracic mobility.  Therapist providing verbal cues to optimize technique  with  exercises in order to achieve the greatest benefit.     OBJECTIVE IMPAIRMENTS: decreased activity tolerance, decreased ROM, decreased strength, increased fascial restrictions, impaired perceived functional ability, impaired UE functional use, and pain.   ACTIVITY LIMITATIONS: carrying, lifting, sleeping, and reach over head  PARTICIPATION LIMITATIONS: cleaning, driving, and shopping  PERSONAL FACTORS: Time since onset of injury/illness/exacerbation and 3+ comorbidities: migraines, fibromyalgia, recent surgery are also affecting  patient's functional outcome.   REHAB POTENTIAL: Good  CLINICAL DECISION MAKING: Evolving/moderate complexity  EVALUATION COMPLEXITY: Moderate   GOALS: Goals reviewed with patient? Yes  SHORT TERM GOALS: Target date: 07/23/2024   The patient will demonstrate knowledge of basic self care strategies and exercises to promote healing  Baseline:  Goal status: met 11/19  2.  Improved cervical rotation ROM to 35 degrees and sidebending to 45 degrees needed for driving Baseline:  Goal status: met 12/17  3.  The patient will report a 30% improvement in pain levels with functional activities which are currently difficult including carrying items and driving Baseline:  Goal status: met 12/7  4.  The patient will have grossly 4/5 strength in UE and periscapular muscles needed to carry items in left arm  Baseline:  Goal status: met 11/19     LONG TERM GOALS: Target date: 10/22/2024     The patient will be independent in a safe self progression of a home exercise program to promote further recovery of function  Baseline:  Goal status: ongoing  2.  The patient will report a 60% improvement in pain levels with functional activities which are currently difficult including carrying items and driving Baseline:  Goal status: ongoing  3.  The patient will have grossly 4+/5 strength needed to lift and lower a 3# object from a high shelf  Baseline:  Goal status: partially met  4.   Improved cervical rotation ROM to 40 degrees and sidebending to 50 degrees needed for driving Baseline:  Goal status: ongoing  5.  Neck Disability Index improved to 28% indicating improved function with less pain Baseline:  Goal status: ongoing    PLAN:  PT FREQUENCY: 1x/week  PT DURATION: 8 weeks  PLANNED INTERVENTIONS: 02835- PT Re-evaluation, 97750- Physical Performance Testing, 97110-Therapeutic exercises, 97530- Therapeutic activity, 97112- Neuromuscular re-education, 97535- Self Care, 02859-  Manual therapy, [phone]- Aquatic Therapy, [phone]- Electrical stimulation (unattended), [phone]- Electrical stimulation (manual), [id number]- Ultrasound, [id number]- Ionotophoresis 4mg /ml Dexamethasone , 79439 (1-2 muscles), 20561 (3+ muscles)- Dry Needling, Patient/Family education, Taping, Joint mobilization, Spinal mobilization, Cryotherapy, and Moist heat  PLAN FOR NEXT SESSION:   review SNAG ex; DN in moderation left upper traps, add levator, rhomboids as tolerated; foam roll MELT and thoracic mobility; low reps and volume of exercise secondary to sensitivity due to fibromyalgia  Glade Pesa, PT 09/10/2024 2:02 PM Phone: [phone] Fax: [phone]   PHYSICAL THERAPY DISCHARGE SUMMARY  Visits from Start of Care: 8  Current functional level related to goals / functional outcomes: The patient called to request discharge as she is getting further treatment for C-diff.  She will get a new referral if she needs to return for PT treatment.   Remaining deficits: As above   Education / Equipment: HEP   Patient agrees to discharge. Patient goals were partially met. Patient is being discharged due to the patient's request.  Glade Pesa, PT 09/30/24 4:37 PM Phone: [phone] Fax: [phone]                    "

## 2024-09-15 DIAGNOSIS — E038 Other specified hypothyroidism: Secondary | ICD-10-CM

## 2024-09-17 ENCOUNTER — Telehealth: Payer: Self-pay

## 2024-09-17 ENCOUNTER — Ambulatory Visit: Admitting: Physical Therapy

## 2024-09-17 DIAGNOSIS — A0472 Enterocolitis due to Clostridium difficile, not specified as recurrent: Secondary | ICD-10-CM

## 2024-09-17 DIAGNOSIS — A0471 Enterocolitis due to Clostridium difficile, recurrent: Secondary | ICD-10-CM

## 2024-09-17 NOTE — Telephone Encounter (Signed)
 Patient LVM on nurse line regarding continued issues with C-diff.  She originally tested positive on 08/21/24.  Prescribed Vancomycin  125 mg x 10 days on 12/11 and then again on 12/18 for persistent symptoms.   Patient reports that she feels that symptoms are coming back and is not sure what to do. She has attempted to reach GI specialist, however, has not been able to reach anyone.   Returned call to patient. She states that she had an improvement in her symptoms, however, they never fully went away. She endorses continued watery diarrhea with abdominal cramping.   Denies fever and reports that she is staying hydrated.   She is asking if she would be a candidate for Dificid  medication. She reports that she had to take this previously for infection. She is wanting to avoid fecal transplant if at all possible.   Advised patient that I would forward message to PCP for further advisement.   Chiquita JAYSON English, RN

## 2024-09-18 MED ORDER — FIDAXOMICIN 200 MG PO TABS: 200.0000 mg | ORAL_TABLET | Freq: Two times a day (BID) | ORAL | 0 refills | Status: DC

## 2024-09-18 NOTE — Telephone Encounter (Signed)
 Patient calls nurse line again in regards to concern.  She reports she has attempted to contact GI several times, however has not been able to speak with anyone.   She is requesting Dificid  if appropriate and also a referral to [facility]. She reports she has not been happy with her care at [facility].  She reports she has been trying to stay as hydrated as possible.   Advised will forward to PCP.

## 2024-09-18 NOTE — Telephone Encounter (Signed)
 I spoke with Molly Giles by phone.  PMH: recurrent C diff infection  She finished her last dose of oral Vancomycin  on 09/15/23 with recurrence of significant diarrhea on 09/18/23.  She has felt dizzy and cold.   A/ possible recurrent C.diff infection.    - No signs of fulminans P/ -  prescription fidaxomicin  200 mg oral twice a day for 10 days to [facility] [address] - Referral to Infectious Disease given patient's prior need for fecal transplant for recurrent C diff.  - Hold minoxidil and push oral intake. - Recommend going to ED if experience syncope.

## 2024-09-22 ENCOUNTER — Other Ambulatory Visit (HOSPITAL_COMMUNITY): Payer: Self-pay

## 2024-09-22 ENCOUNTER — Encounter: Payer: Self-pay | Admitting: Family Medicine

## 2024-09-22 MED ORDER — VANCOMYCIN HCL 125 MG PO CAPS
ORAL_CAPSULE | ORAL | 0 refills | Status: AC
  Filled 2024-09-22: qty 20, 5d supply, fill #0
  Filled 2024-09-22: qty 71, 51d supply, fill #0

## 2024-09-22 MED ORDER — VANCOMYCIN HCL 125 MG PO CAPS: ORAL_CAPSULE | ORAL | 0 refills | Status: DC

## 2024-09-22 NOTE — Addendum Note (Signed)
 Addended by: Ammy Lienhard C on: 09/22/2024 04:50 PM   Modules accepted: Orders

## 2024-09-22 NOTE — Telephone Encounter (Signed)
 Patient calls nurse line. She states that [facility] does not have medication in stock.   She is asking if outpatient pharmacy may have this in stock.   Called [facility]. [facility] does not have any in stock. [facility] has 69 capsules.   Patient would like rx to be cancelled at [facility] and sent to [facility].   Called [facility]. They advised that we could send prescription, they would do a partial fill and then refill the rest once they receive additional stock.   Cancelled prescription at [facility] and resent to [facility].   Chiquita JAYSON English, RN

## 2024-09-25 ENCOUNTER — Other Ambulatory Visit: Payer: Self-pay

## 2024-09-25 ENCOUNTER — Ambulatory Visit: Admitting: Internal Medicine

## 2024-09-25 ENCOUNTER — Other Ambulatory Visit (HOSPITAL_COMMUNITY): Payer: Self-pay

## 2024-09-25 ENCOUNTER — Encounter: Payer: Self-pay | Admitting: Internal Medicine

## 2024-09-25 ENCOUNTER — Ambulatory Visit: Admitting: Physical Therapy

## 2024-09-25 VITALS — BP 132/81 | HR 96 | Temp 97.6°F | Ht 66.5 in | Wt 165.0 lb

## 2024-09-25 DIAGNOSIS — A0472 Enterocolitis due to Clostridium difficile, not specified as recurrent: Secondary | ICD-10-CM

## 2024-09-25 DIAGNOSIS — A0471 Enterocolitis due to Clostridium difficile, recurrent: Secondary | ICD-10-CM | POA: Diagnosis present

## 2024-09-25 DIAGNOSIS — K602 Anal fissure, unspecified: Secondary | ICD-10-CM | POA: Diagnosis not present

## 2024-09-25 NOTE — Progress Notes (Signed)
 "     RFV: recurrent cdifficile Patient ID: Molly Giles, female   DOB: 02-Oct-1955, 69 y.o.   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 In spring of 2021, treated with oral vanco taper, dificid  and zinplava . Has not had a recurrence until now. She had 2 doses of left ofver abtx while she had upper respiratory infection in mid November. She then started to have some diarrhea- at end of November, post prandial diarrhea. Dec 9th -- + test, did 10 days of vancomycin . Then did 2nd round of vancomycin  since still diarrhea after that round. He was switched to dificid  ( got worse in 4 days thus changed to back to oral vancomycin  at the beginning of the year. --   Bowel movements currently are watery diarrhea, sinking to the bottom of toilet. 3-6 times per day. Appetite + is good, but still having post prandial diarrhea No weight loss Since back on vancomycin  now feeling that abdominal cramping improved  Currently on vanco taper listed below  Previously was with dr hung; now seen at [facility]    125 mg, 4 times daily Starting Mon 09/22/2024, For 14 days, THEN 125 mg, 2 times daily Starting Mon 10/06/2024, For 7 days, THEN 125 mg, Daily Starting Mon 10/13/2024, For 7 days, THEN 125 mg, Every other day Starting Mon 10/20/2024, For 28 days      Outpatient Encounter Medications as of 09/25/2024  Medication Sig   citalopram  (CELEXA ) 20 MG tablet TAKE 1 TABLET(20 MG) BY MOUTH DAILY   levothyroxine  (SYNTHROID ) 125 MCG tablet TAKE 1/2 TABLET(62.5 MCG) BY MOUTH DAILY BEFORE BREAKFAST   meloxicam (MOBIC) 15 MG tablet TAKE 1 TABLET BY MOUTH EVERY DAY WITH A MEAL   methenamine (HIPREX) 1 g tablet Take 1 g by mouth 2 (two) times daily with a meal.   methocarbamol  (ROBAXIN ) 500 MG tablet Take 500 mg by mouth as needed.    minoxidil (LONITEN) 2.5 MG tablet Take by mouth daily.   nystatin -triamcinolone  (MYCOLOG II) cream APPLY TOPICALLY TO THE AFFECTED AREA TWICE DAILY   rizatriptan  (MAXALT ) 10  MG tablet Take 1 tablet (10 mg total) by mouth as needed for migraine. Take 1 tablet at onset of migraine. May repeat in 2 hours if needed. (Do not exceed more than 2 tab in 24 hours)   rosuvastatin  (CRESTOR ) 10 MG tablet TAKE 1 TABLET(10 MG) BY MOUTH DAILY   SYSTANE BALANCE 0.6 % SOLN    tirzepatide  (ZEPBOUND ) 5 MG/0.5ML Pen Inject 5 mg into the skin once a week.   traMADol  (ULTRAM ) 50 MG tablet Take 1 tablet by mouth 2 (two) times daily as needed.   TRYPTYR 0.003 % SOLN Apply 1 drop to eye 2 (two) times daily.   vancomycin  (VANCOCIN ) 125 MG capsule Take 1 capsule by mouth 4 times daily for 14 days, THEN take 1 capsule in the morning and at bedtime for 7 days, THEN take 1 capsule daily for 7 days, THEN take 1 capsule every other day for 28 days.   zolpidem  (AMBIEN ) 5 MG tablet Take 1 tablet (5 mg total) by mouth at bedtime as needed for sleep.   URIBEL 81.6 MG TABS Take 1 tablet by mouth 3 (three) times daily as needed. (Patient not taking: Reported on 09/25/2024)   No facility-administered encounter medications on file as of 09/25/2024.     Patient Active Problem List   Diagnosis Date Noted   Chronic pain of left upper extremity 02/19/2024   Arthritis of  carpometacarpal (CMC) joint of left thumb 09/07/2023   BMI 26.0-26.9,adult 02/07/2023   B12 deficiency 01/10/2023   OSA (obstructive sleep apnea) 12/27/2022   Severe obstructive sleep apnea-hypopnea syndrome 12/22/2022   Hyperglycemia 12/11/2022   Generalized obesity 12/11/2022   SOBOE (shortness of breath on exertion) 12/11/2022   Other fatigue 12/11/2022   Sleep related headaches 11/07/2022   Intractable episodic cluster headache 11/07/2022   Psychophysiological insomnia 11/07/2022   Hallux rigidus of right foot 09/25/2022   Chronic pain of right knee 05/08/2022   Arthrosis of hand 12/20/2021   History of Clostridioides difficile colitis, required fecal transplantation 12/20/2021   Meibomian gland dysfunction (MGD) of both eyes  10/17/2021   Nuclear sclerosis of both eyes 10/17/2021   Epiretinal membrane (ERM) of left eye 10/17/2021   OAB (overactive bladder) 03/02/2021   Osteoarthritis of acromioclavicular joint 02/22/2021   Hyperhidrosis, scalp, 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   Chronic low back pain 05/14/2019   Sjogren syndrome 01/27/2019   DDD (degenerative disc disease), cervical 12/27/2018   Cervical spondylosis 11/18/2018   DDD (degenerative disc disease), lumbar 11/18/2018   Keratoconjunctivitis sicca 11/18/2018   Chronic contact dermatitis 08/06/2018   Primary osteoarthritis of left knee 06/20/2018   Insulin  resistance 07/04/2017   Vitamin D  deficiency 05/08/2017   Irritable bowel syndrome with diarrhea 04/03/2016   Left ventricular hypertrophy, mild 02/25/2016   Allergic rhinoconjunctivitis 07/02/2015   History of colonic polyps 07/01/2015   GERD (gastroesophageal reflux disease) 12/16/2014   Fibromyalgia 10/01/2013   Mood disorder 10/01/2013   Hyperlipidemia 10/01/2013   Pure hypercholesterolemia 05/15/2013   Chronic migraine without aura 05/15/2013   Benign essential hypertension 10/28/2012   Hypothyroidism 11/09/2011   Chronic interstitial cystitis 11/09/2011     Health Maintenance Due  Topic Date Due   Influenza Vaccine  04/11/2024   Bone Density Scan  05/12/2024   COVID-19 Vaccine (5 - 2025-26 season) 05/12/2024     Review of Systems 12 point ros is otherwise negative except what is mentioned above Physical Exam   BP 132/81   Pulse 96   Temp 97.6 F (36.4 C) (Temporal)   Ht 5' 6.5 (1.689 m)   Wt 165 lb (74.8 kg)   SpO2 96%   BMI 26.23 kg/m   Physical Exam  Constitutional:  oriented to person, place, and time. appears well-developed and well-nourished. No distress.  HENT: [place]/AT, PERRLA, no scleral icterus Mouth/Throat: Oropharynx is clear and moist. No oropharyngeal exudate.  Cardiovascular: Normal rate, regular rhythm and normal heart sounds. Exam reveals no gallop and no friction  rub.  No murmur heard.  Pulmonary/Chest: Effort normal and breath sounds normal. No respiratory distress.  has no wheezes.  Lymphadenopathy: no cervical adenopathy. No axillary adenopathy Neurological: alert and oriented to person, place, and time.  Skin: Skin is warm and dry. No rash noted. No erythema.  Psychiatric: a normal mood and affect.  behavior is normal.   CBC Lab Results  Component Value Date   WBC 6.0 07/03/2024   RBC 4.37 07/03/2024   HGB 13.4 07/03/2024   HCT 41.5 07/03/2024   PLT 244 07/03/2024   MCV 95 07/03/2024   MCH 30.7 07/03/2024   MCHC 32.3 07/03/2024   RDW 13.1 07/03/2024   LYMPHSABS 1.7 07/03/2024   MONOABS 0.7 03/09/2018   EOSABS 0.2 07/03/2024    BMET Lab Results  Component Value Date   NA 141 09/09/2024   K 4.5 09/09/2024   CL 102 09/09/2024   CO2 21 09/09/2024   GLUCOSE 76 09/09/2024  BUN 18 09/09/2024   CREATININE 0.70 09/09/2024   CALCIUM  9.7 09/09/2024   GFRNONAA 65 07/24/2019   GFRAA 75 07/24/2019      Assessment and Plan Recurrent c.difficile infection = plan to [facility] your vanco taper. Touch base in 2 wk, in person in 4 wk to see if having relapse. May need to consider referral to [facility] for fecal transplant versus [facility]- out of pocket cost [phone])   Test Code 08335  Fissure- diaper cream       "

## 2024-09-30 ENCOUNTER — Telehealth: Payer: Self-pay | Admitting: Pharmacist

## 2024-09-30 NOTE — Telephone Encounter (Signed)
-----   Message from Lavern LOISE Ku sent at 09/26/2024  8:49 AM EST ----- We had never finished discussing this patient the other morning, but I meant to ask more about this medication. Im not super familiar with Dificid  or how long therapy is, but [facility] has this medication on their program. ----- Message ----- From: McDiarmid, Krystal BIRCH, MD Sent: 09/22/2024  12:57 PM EST To: Lavern LOISE Ku, CPhT  Camille, Interesting, I did not know about the Medicare Prescription Payment Plan.  Good to know! Todd ----- Message ----- From: Ku Lavern LOISE, CPhT Sent: 09/22/2024   9:30 AM EST To: Krystal BIRCH McDiarmid, MD; Maude KANDICE Lagos, RPH-CPP  Looks like she has a deductible to meet (her copay is $1000+ using out pharmacies), so it is likely to be high anywhere. It could change by a few dollars depending on how much the pharmacy charges for the med in general.   This is with a Medicare Part D plan. It does give her the option to use the [facility] Prescription Payment Plan as well. ----- Message ----- From: McDiarmid, Krystal BIRCH, MD Sent: 09/22/2024   8:48 AM EST To: [facility]  Is there a way to reduce the cost of Ms Gatti's fidaxomicin  prescription for recurrent C diff diarrhea.  Could she find it at a lower cost at a different pharmacy?  I can't tell if she has a stand alone Medicare Part D or an inclusive Medicare Advantage plan.   Thank you,  Krystal

## 2024-09-30 NOTE — Telephone Encounter (Signed)
 Attempted to contact patient for follow-up of request Dificid  (fidaxomicin ) from Dr. McDiarmid.   Patient did worse following only 4 days of this and was seen 1/15 by [facility], Dr. Juli.  Patient reports improved with oral vancomycin  and most symptoms improved significantly at this time.   She has a taper plan.  She is aware that if this round of treatment fails, she will be referred to [facility] for microbiom replacement tx. (Vowst regimen)  I wished her success with current treatment plan and communicated that we would not pursue any plan involving fidaxomicin  at this time.    Total time with patient call and documentation of interaction: 14 minutes.

## 2024-10-01 ENCOUNTER — Ambulatory Visit: Admitting: Physical Therapy

## 2024-10-01 NOTE — Telephone Encounter (Signed)
 Reviewed and agree with Dr Rennis plan.

## 2024-10-02 ENCOUNTER — Encounter (INDEPENDENT_AMBULATORY_CARE_PROVIDER_SITE_OTHER): Payer: Self-pay

## 2024-10-02 ENCOUNTER — Ambulatory Visit (INDEPENDENT_AMBULATORY_CARE_PROVIDER_SITE_OTHER): Admitting: Family Medicine

## 2024-10-03 ENCOUNTER — Ambulatory Visit: Admitting: Internal Medicine

## 2024-10-06 ENCOUNTER — Ambulatory Visit: Admitting: Internal Medicine

## 2024-10-07 ENCOUNTER — Ambulatory Visit (INDEPENDENT_AMBULATORY_CARE_PROVIDER_SITE_OTHER): Admitting: Family Medicine

## 2024-10-08 ENCOUNTER — Ambulatory Visit: Admitting: Physical Therapy

## 2024-10-08 ENCOUNTER — Encounter: Payer: Self-pay | Admitting: Family Medicine

## 2024-10-08 DIAGNOSIS — A0472 Enterocolitis due to Clostridium difficile, not specified as recurrent: Secondary | ICD-10-CM | POA: Insufficient documentation

## 2024-10-08 DIAGNOSIS — M1811 Unilateral primary osteoarthritis of first carpometacarpal joint, right hand: Secondary | ICD-10-CM | POA: Insufficient documentation

## 2024-10-08 DIAGNOSIS — L259 Unspecified contact dermatitis, unspecified cause: Secondary | ICD-10-CM | POA: Insufficient documentation

## 2024-10-14 ENCOUNTER — Telehealth: Payer: Self-pay

## 2024-10-14 ENCOUNTER — Other Ambulatory Visit (HOSPITAL_COMMUNITY): Payer: Self-pay | Admitting: *Deleted

## 2024-10-14 MED ORDER — ZOLPIDEM TARTRATE 5 MG PO TABS: 5.0000 mg | ORAL_TABLET | Freq: Every evening | ORAL | 0 refills | Status: AC | PRN

## 2024-10-14 NOTE — Telephone Encounter (Signed)
 Patient called to update provider since her last visit she had decreased her vancomycin  to once a day. She reports decrease in BM (now 4-5 a day). BM are formed but soft. Denies watery or loose stool.  Will try to drop off stool sample this week if possible.  Lorenda CHRISTELLA Code, RMA

## 2024-10-15 ENCOUNTER — Ambulatory Visit: Admitting: Physical Therapy

## 2024-10-15 ENCOUNTER — Telehealth: Payer: Self-pay | Admitting: Family Medicine

## 2024-10-15 MED ORDER — RIZATRIPTAN BENZOATE 10 MG PO TABS: 10.0000 mg | ORAL_TABLET | ORAL | 5 refills | Status: AC | PRN

## 2024-10-15 NOTE — Telephone Encounter (Signed)
 Pt called and LVM requesting a refill on her rizatriptan  (MAXALT ) 10 MG tablet and getting it sent to the [facility] [address]

## 2024-10-15 NOTE — Telephone Encounter (Signed)
 Sent script to pharmacy.

## 2024-10-22 ENCOUNTER — Ambulatory Visit: Admitting: Internal Medicine

## 2024-10-30 ENCOUNTER — Ambulatory Visit (INDEPENDENT_AMBULATORY_CARE_PROVIDER_SITE_OTHER): Admitting: Family Medicine

## 2024-11-18 ENCOUNTER — Ambulatory Visit (HOSPITAL_COMMUNITY): Admitting: Psychiatry

## 2025-01-19 ENCOUNTER — Encounter

## 2025-03-31 ENCOUNTER — Ambulatory Visit: Admitting: Family Medicine

## 2025-04-01 ENCOUNTER — Ambulatory Visit: Admitting: Family Medicine
